# Patient Record
Sex: Male | Born: 1960 | Race: Black or African American | Hispanic: No | Smoking: Never smoker
Health system: Southern US, Community
[De-identification: ages and names within clinical notes are randomized; demographics above are authoritative.]

## PROBLEM LIST (undated history)

## (undated) DIAGNOSIS — I509 Heart failure, unspecified: Secondary | ICD-10-CM

## (undated) DIAGNOSIS — K220 Achalasia of cardia: Secondary | ICD-10-CM

## (undated) DIAGNOSIS — E079 Disorder of thyroid, unspecified: Secondary | ICD-10-CM

## (undated) DIAGNOSIS — F329 Major depressive disorder, single episode, unspecified: Secondary | ICD-10-CM

## (undated) DIAGNOSIS — Z87442 Personal history of urinary calculi: Secondary | ICD-10-CM

## (undated) DIAGNOSIS — Z955 Presence of coronary angioplasty implant and graft: Secondary | ICD-10-CM

## (undated) DIAGNOSIS — E785 Hyperlipidemia, unspecified: Secondary | ICD-10-CM

## (undated) DIAGNOSIS — Z9861 Coronary angioplasty status: Secondary | ICD-10-CM

## (undated) DIAGNOSIS — I214 Non-ST elevation (NSTEMI) myocardial infarction: Secondary | ICD-10-CM

## (undated) DIAGNOSIS — Z8679 Personal history of other diseases of the circulatory system: Secondary | ICD-10-CM

## (undated) DIAGNOSIS — F3289 Other specified depressive episodes: Secondary | ICD-10-CM

## (undated) DIAGNOSIS — D649 Anemia, unspecified: Secondary | ICD-10-CM

## (undated) DIAGNOSIS — R51 Headache: Secondary | ICD-10-CM

## (undated) DIAGNOSIS — G4700 Insomnia, unspecified: Secondary | ICD-10-CM

## (undated) DIAGNOSIS — I219 Acute myocardial infarction, unspecified: Secondary | ICD-10-CM

## (undated) DIAGNOSIS — Z8489 Family history of other specified conditions: Secondary | ICD-10-CM

## (undated) DIAGNOSIS — K5900 Constipation, unspecified: Secondary | ICD-10-CM

## (undated) DIAGNOSIS — G4730 Sleep apnea, unspecified: Secondary | ICD-10-CM

## (undated) DIAGNOSIS — R569 Unspecified convulsions: Secondary | ICD-10-CM

## (undated) DIAGNOSIS — I10 Essential (primary) hypertension: Secondary | ICD-10-CM

## (undated) DIAGNOSIS — I2510 Atherosclerotic heart disease of native coronary artery without angina pectoris: Secondary | ICD-10-CM

## (undated) DIAGNOSIS — Z860100 Personal history of colon polyps, unspecified: Secondary | ICD-10-CM

## (undated) DIAGNOSIS — R7302 Impaired glucose tolerance (oral): Secondary | ICD-10-CM

## (undated) DIAGNOSIS — I639 Cerebral infarction, unspecified: Secondary | ICD-10-CM

## (undated) DIAGNOSIS — I429 Cardiomyopathy, unspecified: Secondary | ICD-10-CM

## (undated) DIAGNOSIS — Z8719 Personal history of other diseases of the digestive system: Secondary | ICD-10-CM

## (undated) DIAGNOSIS — N259 Disorder resulting from impaired renal tubular function, unspecified: Secondary | ICD-10-CM

## (undated) DIAGNOSIS — K219 Gastro-esophageal reflux disease without esophagitis: Secondary | ICD-10-CM

## (undated) DIAGNOSIS — M19019 Primary osteoarthritis, unspecified shoulder: Secondary | ICD-10-CM

## (undated) DIAGNOSIS — N184 Chronic kidney disease, stage 4 (severe): Secondary | ICD-10-CM

## (undated) DIAGNOSIS — M109 Gout, unspecified: Secondary | ICD-10-CM

## (undated) HISTORY — DX: Gastro-esophageal reflux disease without esophagitis: K21.9

## (undated) HISTORY — DX: Achalasia of cardia: K22.0

## (undated) HISTORY — DX: Headache: R51

## (undated) HISTORY — PX: TONSILLECTOMY: SHX5217

## (undated) HISTORY — DX: Other specified depressive episodes: F32.89

## (undated) HISTORY — DX: Cardiomyopathy, unspecified: I42.9

## (undated) HISTORY — DX: Hyperlipidemia, unspecified: E78.5

## (undated) HISTORY — DX: Disorder resulting from impaired renal tubular function, unspecified: N25.9

## (undated) HISTORY — DX: Anemia, unspecified: D64.9

## (undated) HISTORY — PX: CARDIAC CATHETERIZATION: SHX172

## (undated) HISTORY — DX: Impaired glucose tolerance (oral): R73.02

## (undated) HISTORY — DX: Disorder of thyroid, unspecified: E07.9

## (undated) HISTORY — PX: COLONOSCOPY: SHX174

## (undated) HISTORY — DX: Major depressive disorder, single episode, unspecified: F32.9

## (undated) HISTORY — DX: Personal history of urinary calculi: Z87.442

## (undated) HISTORY — PX: CORONARY ANGIOPLASTY: SHX604

## (undated) HISTORY — DX: Essential (primary) hypertension: I10

## (undated) HISTORY — DX: Primary osteoarthritis, unspecified shoulder: M19.019

## (undated) HISTORY — DX: Acute myocardial infarction, unspecified: I21.9

## (undated) HISTORY — DX: Personal history of other diseases of the circulatory system: Z86.79

## (undated) HISTORY — PX: TONSILLECTOMY: SUR1361

---

## 2005-06-09 ENCOUNTER — Ambulatory Visit: Payer: Self-pay | Admitting: Internal Medicine

## 2005-06-19 ENCOUNTER — Ambulatory Visit: Payer: Self-pay | Admitting: Internal Medicine

## 2005-06-30 ENCOUNTER — Inpatient Hospital Stay (HOSPITAL_COMMUNITY): Admission: EM | Admit: 2005-06-30 | Discharge: 2005-07-04 | Payer: Self-pay | Admitting: Emergency Medicine

## 2005-06-30 ENCOUNTER — Ambulatory Visit: Payer: Self-pay | Admitting: Internal Medicine

## 2005-07-02 ENCOUNTER — Encounter: Payer: Self-pay | Admitting: Cardiovascular Disease

## 2005-07-02 ENCOUNTER — Ambulatory Visit: Payer: Self-pay | Admitting: Cardiovascular Disease

## 2005-07-09 ENCOUNTER — Ambulatory Visit: Payer: Self-pay | Admitting: Internal Medicine

## 2005-07-11 ENCOUNTER — Ambulatory Visit: Payer: Self-pay | Admitting: Internal Medicine

## 2005-07-15 ENCOUNTER — Ambulatory Visit (HOSPITAL_COMMUNITY): Admission: RE | Admit: 2005-07-15 | Discharge: 2005-07-15 | Payer: Self-pay | Admitting: Internal Medicine

## 2005-07-18 ENCOUNTER — Ambulatory Visit: Payer: Self-pay | Admitting: Licensed Clinical Social Worker

## 2005-07-18 ENCOUNTER — Ambulatory Visit: Payer: Self-pay | Admitting: Internal Medicine

## 2005-07-22 ENCOUNTER — Ambulatory Visit: Payer: Self-pay | Admitting: Licensed Clinical Social Worker

## 2005-08-05 ENCOUNTER — Encounter: Admission: RE | Admit: 2005-08-05 | Discharge: 2005-08-05 | Payer: Self-pay | Admitting: Nephrology

## 2005-08-08 ENCOUNTER — Ambulatory Visit: Payer: Self-pay | Admitting: Licensed Clinical Social Worker

## 2005-08-15 ENCOUNTER — Ambulatory Visit: Payer: Self-pay | Admitting: Internal Medicine

## 2005-08-18 ENCOUNTER — Ambulatory Visit: Payer: Self-pay | Admitting: Internal Medicine

## 2005-08-22 ENCOUNTER — Ambulatory Visit: Payer: Self-pay | Admitting: Licensed Clinical Social Worker

## 2005-09-08 ENCOUNTER — Encounter: Admission: RE | Admit: 2005-09-08 | Discharge: 2005-12-07 | Payer: Self-pay | Admitting: Neurology

## 2005-09-19 ENCOUNTER — Ambulatory Visit: Payer: Self-pay | Admitting: Internal Medicine

## 2005-09-19 ENCOUNTER — Ambulatory Visit: Payer: Self-pay | Admitting: Licensed Clinical Social Worker

## 2005-09-22 ENCOUNTER — Ambulatory Visit: Payer: Self-pay | Admitting: Internal Medicine

## 2005-10-03 ENCOUNTER — Ambulatory Visit (HOSPITAL_COMMUNITY): Admission: RE | Admit: 2005-10-03 | Discharge: 2005-10-03 | Payer: Self-pay | Admitting: Internal Medicine

## 2005-10-03 LAB — HM COLONOSCOPY

## 2005-10-07 ENCOUNTER — Ambulatory Visit: Payer: Self-pay | Admitting: Internal Medicine

## 2006-03-03 ENCOUNTER — Ambulatory Visit: Payer: Self-pay | Admitting: Internal Medicine

## 2006-03-05 ENCOUNTER — Ambulatory Visit (HOSPITAL_COMMUNITY): Admission: RE | Admit: 2006-03-05 | Discharge: 2006-03-05 | Payer: Self-pay | Admitting: Internal Medicine

## 2006-05-19 ENCOUNTER — Ambulatory Visit: Payer: Self-pay | Admitting: Internal Medicine

## 2006-05-20 ENCOUNTER — Encounter (INDEPENDENT_AMBULATORY_CARE_PROVIDER_SITE_OTHER): Payer: Self-pay | Admitting: *Deleted

## 2006-05-20 ENCOUNTER — Ambulatory Visit: Payer: Self-pay | Admitting: Internal Medicine

## 2006-06-15 ENCOUNTER — Ambulatory Visit (HOSPITAL_COMMUNITY): Admission: RE | Admit: 2006-06-15 | Discharge: 2006-06-15 | Payer: Self-pay | Admitting: Internal Medicine

## 2006-07-02 ENCOUNTER — Ambulatory Visit: Payer: Self-pay | Admitting: Internal Medicine

## 2006-07-14 ENCOUNTER — Ambulatory Visit (HOSPITAL_COMMUNITY): Admission: RE | Admit: 2006-07-14 | Discharge: 2006-07-14 | Payer: Self-pay | Admitting: Internal Medicine

## 2006-07-16 ENCOUNTER — Ambulatory Visit: Payer: Self-pay | Admitting: Internal Medicine

## 2006-10-25 DIAGNOSIS — R51 Headache: Secondary | ICD-10-CM

## 2006-10-25 DIAGNOSIS — E785 Hyperlipidemia, unspecified: Secondary | ICD-10-CM

## 2006-10-25 DIAGNOSIS — I10 Essential (primary) hypertension: Secondary | ICD-10-CM | POA: Insufficient documentation

## 2006-10-25 DIAGNOSIS — M19019 Primary osteoarthritis, unspecified shoulder: Secondary | ICD-10-CM | POA: Insufficient documentation

## 2006-10-25 DIAGNOSIS — K649 Unspecified hemorrhoids: Secondary | ICD-10-CM | POA: Insufficient documentation

## 2006-10-25 DIAGNOSIS — R519 Headache, unspecified: Secondary | ICD-10-CM | POA: Insufficient documentation

## 2006-10-25 DIAGNOSIS — J309 Allergic rhinitis, unspecified: Secondary | ICD-10-CM | POA: Insufficient documentation

## 2006-10-25 DIAGNOSIS — F329 Major depressive disorder, single episode, unspecified: Secondary | ICD-10-CM

## 2006-10-25 DIAGNOSIS — F32A Depression, unspecified: Secondary | ICD-10-CM | POA: Insufficient documentation

## 2006-10-25 DIAGNOSIS — I639 Cerebral infarction, unspecified: Secondary | ICD-10-CM | POA: Insufficient documentation

## 2006-10-25 DIAGNOSIS — Z87442 Personal history of urinary calculi: Secondary | ICD-10-CM

## 2007-03-18 HISTORY — PX: STOMACH SURGERY: SHX791

## 2007-10-26 ENCOUNTER — Telehealth (INDEPENDENT_AMBULATORY_CARE_PROVIDER_SITE_OTHER): Payer: Self-pay | Admitting: *Deleted

## 2008-05-15 ENCOUNTER — Telehealth (INDEPENDENT_AMBULATORY_CARE_PROVIDER_SITE_OTHER): Payer: Self-pay | Admitting: *Deleted

## 2008-05-16 ENCOUNTER — Ambulatory Visit: Payer: Self-pay | Admitting: Endocrinology

## 2008-05-16 LAB — CONVERTED CEMR LAB
BUN: 18 mg/dL (ref 6–23)
Basophils Relative: 0 % (ref 0.0–3.0)
GFR calc Af Amer: 49 mL/min
GFR calc non Af Amer: 41 mL/min
Glucose, Bld: 102 mg/dL — ABNORMAL HIGH (ref 70–99)
HCT: 49.3 % (ref 39.0–52.0)
Hemoglobin: 16.8 g/dL (ref 13.0–17.0)
Ketones, ur: NEGATIVE mg/dL
Monocytes Absolute: 0.6 10*3/uL (ref 0.1–1.0)
Monocytes Relative: 7 % (ref 3.0–12.0)
Neutro Abs: 5.9 10*3/uL (ref 1.4–7.7)
Platelets: 276 10*3/uL (ref 150–400)
RBC: 5.29 M/uL (ref 4.22–5.81)
RDW: 12.6 % (ref 11.5–14.6)
Sed Rate: 22 mm/hr — ABNORMAL HIGH (ref 0–16)
Total Protein, Urine: >=300 mg/dL — AB
Urine Glucose: NEGATIVE mg/dL
WBC: 8.6 10*3/uL (ref 4.5–10.5)

## 2008-05-17 ENCOUNTER — Telehealth (INDEPENDENT_AMBULATORY_CARE_PROVIDER_SITE_OTHER): Payer: Self-pay | Admitting: *Deleted

## 2008-05-22 ENCOUNTER — Encounter (INDEPENDENT_AMBULATORY_CARE_PROVIDER_SITE_OTHER): Payer: Self-pay | Admitting: *Deleted

## 2008-05-22 ENCOUNTER — Ambulatory Visit: Payer: Self-pay | Admitting: Internal Medicine

## 2008-05-22 DIAGNOSIS — N259 Disorder resulting from impaired renal tubular function, unspecified: Secondary | ICD-10-CM | POA: Insufficient documentation

## 2008-05-22 DIAGNOSIS — I739 Peripheral vascular disease, unspecified: Secondary | ICD-10-CM

## 2008-05-22 DIAGNOSIS — K220 Achalasia of cardia: Secondary | ICD-10-CM

## 2008-05-22 DIAGNOSIS — K219 Gastro-esophageal reflux disease without esophagitis: Secondary | ICD-10-CM

## 2008-05-22 DIAGNOSIS — D649 Anemia, unspecified: Secondary | ICD-10-CM

## 2008-05-23 LAB — CONVERTED CEMR LAB
Direct LDL: 209.1 mg/dL
HDL: 47.9 mg/dL (ref >39.0)
VLDL: 27 mg/dL (ref 0–40)

## 2008-06-06 ENCOUNTER — Ambulatory Visit: Payer: Self-pay | Admitting: Licensed Clinical Social Worker

## 2008-06-13 ENCOUNTER — Ambulatory Visit: Payer: Self-pay | Admitting: Licensed Clinical Social Worker

## 2008-06-23 ENCOUNTER — Ambulatory Visit: Payer: Self-pay | Admitting: Internal Medicine

## 2008-06-23 ENCOUNTER — Ambulatory Visit: Payer: Self-pay | Admitting: Licensed Clinical Social Worker

## 2008-06-23 LAB — CONVERTED CEMR LAB
ALT: 56 units/L — ABNORMAL HIGH (ref 0–53)
Albumin: 3.9 g/dL (ref 3.5–5.2)
Alkaline Phosphatase: 43 units/L (ref 39–117)
BUN: 17 mg/dL (ref 6–23)
Calcium: 9.5 mg/dL (ref 8.4–10.5)
Chloride: 107 meq/L (ref 96–112)
Creatinine, Ser: 1.9 mg/dL — ABNORMAL HIGH (ref 0.4–1.5)
Eosinophils Relative: 3.7 % (ref 0.0–5.0)
GFR calc non Af Amer: 48.88 mL/min (ref >60)
HCT: 40 % (ref 39.0–52.0)
Hemoglobin: 13.6 g/dL (ref 13.0–17.0)
Leukocytes, UA: NEGATIVE
MCHC: 33.9 g/dL (ref 30.0–36.0)
MCV: 92.5 fL (ref 78.0–100.0)
Monocytes Relative: 10.2 % (ref 3.0–12.0)
PSA: 2.06 ng/mL (ref 0.10–4.00)
Platelets: 262 10*3/uL (ref 150.0–400.0)
Potassium: 4.4 meq/L (ref 3.5–5.1)
RBC: 4.33 M/uL (ref 4.22–5.81)
Sodium: 143 meq/L (ref 135–145)
TSH: 1.29 microintl units/mL (ref 0.35–5.50)
Total Bilirubin: 0.9 mg/dL (ref 0.3–1.2)
Total CHOL/HDL Ratio: 4
Total Protein, Urine: >=300 mg/dL
Total Protein: 7 g/dL (ref 6.0–8.3)
Triglycerides: 91 mg/dL (ref 0.0–149.0)
Urine Glucose: NEGATIVE mg/dL
WBC: 7 10*3/uL (ref 4.5–10.5)

## 2008-07-18 ENCOUNTER — Ambulatory Visit: Payer: Self-pay | Admitting: Licensed Clinical Social Worker

## 2008-07-24 ENCOUNTER — Ambulatory Visit: Payer: Self-pay | Admitting: Licensed Clinical Social Worker

## 2008-12-20 ENCOUNTER — Ambulatory Visit: Payer: Self-pay | Admitting: Internal Medicine

## 2009-07-02 ENCOUNTER — Telehealth: Payer: Self-pay | Admitting: Internal Medicine

## 2009-10-29 ENCOUNTER — Ambulatory Visit: Payer: Self-pay | Admitting: Internal Medicine

## 2010-02-25 ENCOUNTER — Encounter: Payer: Self-pay | Admitting: Internal Medicine

## 2010-02-26 ENCOUNTER — Encounter: Payer: Self-pay | Admitting: Internal Medicine

## 2010-03-01 ENCOUNTER — Encounter: Payer: Self-pay | Admitting: Internal Medicine

## 2010-04-07 ENCOUNTER — Encounter: Payer: Self-pay | Admitting: Nephrology

## 2010-04-16 NOTE — Assessment & Plan Note (Signed)
Summary: mother tested for MRSA - he wants to be checked/cd   Vital Signs:  Patient profile:   50 year old male Height:      72 inches Weight:      270.25 pounds BMI:     36.78 O2 Sat:      97 % on Room air Temp:     98.6 degrees F oral Pulse rate:   61 / minute BP sitting:   108 / 70  (left arm) Cuff size:   large  Vitals Entered By: Zella Ball Ewing CMA Duncan Dull) (October 29, 2009 8:54 AM)  O2 Flow:  Room air CC: MRSA test/RE   CC:  MRSA test/RE.  History of Present Illness: here to f/u ; labetolol changed to tekturna per renal MD in [place];  currently lives there;  local mother had knee replcement, had a Boil on the buttock , not clear if MRSA (neg per per) and he is wondering if he has ben exposed as he had to lif and touch her while helping to take care of her;  currently no fever, nasal pain or d/c;  and no boils and no other skin erythema, or tears. NO hx of MRSA himself.  Pt denies CP, sob, doe, wheezing, orthopnea, pnd, worsening LE edema, palps, dizziness or syncope  Pt denies new neuro symptoms such as headache, facial or extremity weakness  OVeralll excellent compliance with meds, and good tolerability.  BP was overcontrolled adn pt had the "shake" with taking the labetolol. Trying to follow lower chol diet.    Problems Prior to Update: 1)  Preventive Health Care  (ICD-V70.0) 2)  Special Screening Malig Neoplasms Other Sites  (ICD-V76.49) 3)  Anemia-nos  (ICD-285.9) 4)  Achalasia  (ICD-530.0) 5)  Peripheral Vascular Disease  (ICD-443.9) 6)  Renal Insufficiency  (ICD-588.9) 7)  Gerd  (ICD-530.81) 8)  Fever Unspecified  (ICD-780.60) 9)  Headache  (ICD-784.0) 10)  Hemorrhoids  (ICD-455.6) 11)  Degenerative Joint Disease, Shoulder  (ICD-715.91) 12)  Symptom, Syncope and Collapse  (ICD-780.2) 13)  Renal Calculus, Hx of  (ICD-V13.01) 14)  Cerebrovascular Accident, Hx of  (ICD-V12.50) 15)  Hypertension  (ICD-401.9) 16)  Hyperlipidemia  (ICD-272.4) 17)  Depression   (ICD-311) 18)  Allergic Rhinitis  (ICD-477.9)  Medications Prior to Update: 1)  Crestor 20 Mg Tabs (Rosuvastatin Calcium) .Marland Kitchen.. 1po Once Daily 2)  Azor 10-40 Mg Tabs (Amlodipine-Olmesartan) .... Take 1 By Mouth Qd 3)  Clonidine Hcl 0.2 Mg Tabs (Clonidine Hcl) .... Take 1 Three Times A Day 4)  Folic Acid 400 Mcg Tabs (Folic Acid) .... Take 1 By Mouth Once Daily 5)  Vitamin B-12 Cr 1000 Mcg Cr-Tabs (Cyanocobalamin) .... Take 1 By Mouth Once Daily 6)  Ecotrin 325 Mg Tbec (Aspirin) .Marland Kitchen.. 1 By Mouth Once Daily 7)  Labetalol Hcl 200 Mg Tabs (Labetalol Hcl) .Marland Kitchen.. 1 By Mouth Two Times A Day 8)  Terazosin Hcl 2 Mg Caps (Terazosin Hcl) .Marland Kitchen.. 1 By Mouth At Bedtime  Current Medications (verified): 1)  Crestor 20 Mg Tabs (Rosuvastatin Calcium) .Marland Kitchen.. 1po Once Daily 2)  Azor 10-40 Mg Tabs (Amlodipine-Olmesartan) .... Take 1 By Mouth Qd 3)  Clonidine Hcl 0.2 Mg Tabs (Clonidine Hcl) .... Take 1 Three Times A Day 4)  Folic Acid 400 Mcg Tabs (Folic Acid) .... Take 1 By Mouth Once Daily 5)  Vitamin B-12 Cr 1000 Mcg Cr-Tabs (Cyanocobalamin) .... Take 1 By Mouth Once Daily 6)  Ecotrin 325 Mg Tbec (Aspirin) .Marland Kitchen.. 1 By Mouth Once Daily 7)  Tekturna 150 Mg Tabs (Aliskiren Fumarate) .Marland Kitchen.. 1 By Mouth Once Daily 8)  Terazosin Hcl 2 Mg Caps (Terazosin Hcl) .Marland Kitchen.. 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   Head:  normocephalic and atraumatic.   Eyes:  vision grossly intact, pupils equal, and pupils round.   Ears:  R ear normal and L ear normal.   Nose:  no external deformity and no nasal discharge.   Mouth:  no gingival abnormalities and pharynx pink and moist.   Neck:  supple and no masses.   Lungs:  normal respiratory effort and normal breath sounds.   Heart:  normal rate and regular rhythm.   Abdomen:  soft, non-tender, and normal bowel sounds.   Extremities:  no edema, no erythema    Impression & Recommendations:  Problem # 1:  HYPERTENSION (ICD-401.9)  His updated medication list for this problem includes:    Azor 10-40 Mg Tabs (Amlodipine-olmesartan) .Marland Kitchen... Take 1 by mouth qd    Clonidine Hcl 0.2 Mg Tabs (Clonidine hcl) .Marland Kitchen... Take 1 three times a day    Tekturna 150 Mg Tabs (Aliskiren fumarate) .Marland Kitchen... 1 by mouth once daily    Terazosin Hcl 2 Mg Caps (Terazosin hcl) .Marland Kitchen... 1 by mouth at bedtime  BP today: 108/70 Prior BP: 142/94 (06/23/2008)  Labs Reviewed: K+: 4.4 (06/23/2008) Creat: : 1.9 (06/23/2008)   Chol: 206 (06/23/2008)   HDL: 56.60 (06/23/2008)   LDL: DEL (05/22/2008)   TG: 91.0 (06/23/2008) stable overall by hx and exam, ok to continue meds/tx as is, mult samples of the azor given today, f/u with MD in [place] where he now live  Problem # 2:  RENAL INSUFFICIENCY (ICD-588.9) stable overall by hx and exam, ok to continue meds/tx as is  - declines lab check today  Problem # 3:  HYPERLIPIDEMIA (ICD-272.4)  His updated medication list for this problem includes:    Crestor 20 Mg Tabs (Rosuvastatin calcium) .Marland Kitchen... 1po once daily  Labs  Reviewed: SGOT: 44 (06/23/2008)   SGPT: 56 (06/23/2008)   HDL:56.60 (06/23/2008), 47.9 (05/22/2008)  LDL:DEL (05/22/2008)  Chol:206 (06/23/2008), 306 (05/22/2008)  Trig:91.0 (06/23/2008), 134 (05/22/2008) stable overall by hx and exam, ok to continue meds/tx as is, declines labs today  Complete Medication List: 1)  Crestor 20 Mg Tabs (Rosuvastatin calcium) .Marland Kitchen.. 1po once daily 2)  Azor 10-40 Mg Tabs (Amlodipine-olmesartan) .... Take 1 by mouth qd 3)  Clonidine Hcl 0.2 Mg Tabs (Clonidine hcl) .... Take 1 three times a day 4)  Folic Acid 400 Mcg Tabs (Folic acid) .... Take 1 by mouth once daily 5)  Vitamin B-12 Cr 1000 Mcg Cr-tabs (Cyanocobalamin) .... Take 1 by mouth once daily 6)  Ecotrin 325 Mg Tbec (Aspirin) .Marland KitchenMarland KitchenMarland Kitchen  1 by mouth once daily 7)  Tekturna 150 Mg Tabs (Aliskiren fumarate) .Marland Kitchen.. 1 by mouth once daily 8)  Terazosin Hcl 2 Mg Caps (Terazosin hcl) .Marland Kitchen.. 1 by mouth at bedtime  Patient Instructions: 1)  Continue all previous medications as before this visit  2)  You are given multiple samples of the azor today 3)  Please schedule a follow-up appointment as needed. 4)  Please go to the [facility] Dept to see about the MRSA

## 2010-04-16 NOTE — Progress Notes (Signed)
  Phone Note Refill Request  on July 02, 2009 3:15 PM  Refills Requested: Medication #1:  CLONIDINE HCL 0.2 MG TABS TAKE 1 three times a day   Dosage confirmed as above?Dosage Confirmed   Notes: [address] fax#[phone] Initial call taken by: Scharlene Gloss,  July 02, 2009 3:16 PM    Prescriptions: CLONIDINE HCL 0.2 MG TABS (CLONIDINE HCL) TAKE 1 three times a day  #30 x 0   Entered by:   Scharlene Gloss   Authorized by:   Corwin Levins MD   Signed by:   Scharlene Gloss on 07/02/2009   Method used:   Faxed to ...       [facility]* (retail)       [address]       [place], [place]         Ph: [phone]       Fax: [phone]   RxID:   [id number]

## 2010-04-18 NOTE — Letter (Signed)
Summary: CMN/Infusion Resource  CMN/Infusion Resource   Imported By: Lester San Gabriel 03/04/2010 09:48:06  _____________________________________________________________________  External Attachment:    Type:   Image     Comment:   External Document

## 2010-04-18 NOTE — Letter (Signed)
Summary: CMN for [employer]/Infusion Resource  CMN for [employer]/Infusion Resource   Imported By: Sherian Rein 03/06/2010 11:11:09  _____________________________________________________________________  External Attachment:    Type:   Image     Comment:   External Document

## 2010-04-18 NOTE — Letter (Signed)
Summary: Denied/Infusion Resource  Denied/Infusion Resource   Imported By: Lester Walker 03/01/2010 09:58:30  _____________________________________________________________________  External Attachment:    Type:   Image     Comment:   External Document

## 2010-08-02 NOTE — Discharge Summary (Signed)
[name], [name]              ACCOUNT NO.:  [id number]   MEDICAL RECORD NO.:  [id number]          PATIENT TYPE:  INP   LOCATION:  4735                         FACILITY:  [facility]   PHYSICIAN:  Rene Paci, M.D. LHCDATE OF BIRTH:  09/05/1960   DATE OF ADMISSION:  06/30/2005  DATE OF DISCHARGE:  07/04/2005                                 DISCHARGE SUMMARY   DISCHARGE DIAGNOSES:  1.  Multiple small bihemispheric strokes not acute.  2.  Right lower extremity weakness, etiology unclear.  3.  Uncontrolled hypertension.  4.  Dyslipidemia.   HISTORY OF PRESENT ILLNESS:  The patient is a 50 year old African-American  male admitted on June 30, 2005 from the office with complaints of right leg  weakness and right sided numbness. The patient presented with worsening  right leg symptoms and numbness on his right side. The patient had a past  medical history of CVA most recently in February 2007 and also twice in  2006. He apparently was treated for a cerebral hemorrhage in the [place],  [place] area. The patient was admitted for further evaluation.   PAST MEDICAL HISTORY:  1.  History of CVA, possible cerebral hemorrhage.  2.  History of depression.  3.  History of fainting spells.  4.  Hypertension uncontrolled.  5.  Questionable chronic renal insufficiency.  6.  History of headaches.   HOSPITAL COURSE:  #1.  RIGHT LOWER EXTREMITY WEAKNESS.  The patient was  admitted and underwent an MRI of the brain. MRI of the brain noted multiple  small bihemispheric strokes without any acute changes. An MRI of the C spine  was ordered to rule out myelopathy. MRI did not reveal myelopathy; however,  it did reveal some disk protrusion C5 to 6 and C6 to 7. Homocystine level  was checked which was elevated and the patient was placed Foltx by neurology  who consulted during this admission. The patient was seen by Dr. Pearlean Brownie of  [facility].   #2.  UNCONTROLLED HYPERTENSION.  The  patient was noted to have elevated  blood pressure on admission; however, the patient's blood pressure was noted  to become increasingly elevated during the admission and as a result his  medications were increased. He will be discharged home on lisinopril,  labetalol, Norvasc and Catapres. However, the patient's blood pressure is  very difficult to control. He will need further outpatient evaluation of the  etiology of his hypertension or a possible secondary hypertension workup.  Will defer the patient to primary care physician.   #3.  DYSLIPIDEMIA.  The patient's Statin was increased during this admission  and the patient's total cholesterol was 216 and his HDL was 37.4.   LABORATORY DATA:  At discharge, homocystine 16.5. A 2-D echo shows left  ventricular ejection fraction of 60% with no embolism noted. BUN 15,  creatinine 1.9. Hemoglobin 13.4, hematocrit 39.5.   DISCHARGE MEDICATIONS:  1.  Zetia 10 mg p.o. daily.  2.  Lisinopril 20 g p.o. daily.  3.  Aspirin 325 mg p.o. daily.  4.  Foltx 1 tablet p.o. daily.  5.  Zocor 60  mg p.o. daily.  6.  Catapres 0.2 mg p.o. t.i.d.  7.  Norvasc 10 mg daily.  8.  Plavix 75 mg daily.   FOLLOW UP:  The patient is scheduled to followup with Dr. Oliver Barre on May  1 at 10:00 a.m. He is also being referred for outpatient physical therapy.  He is instructed to go to the ER should he develop worsening right sided  weakness.      Melissa S. Peggyann Juba, NP      Rene Paci, M.D. [facility]  Electronically Signed    MSO/MEDQ  D:  07/04/2005  T:  07/05/2005  Job:  [id number]   cc:   Corwin Levins, M.D. [facility]  [address]  [place]  [place] [address]

## 2010-08-02 NOTE — H&P (Signed)
[name], [name]              ACCOUNT NO.:  [id number]   MEDICAL RECORD NO.:  [id number]          PATIENT TYPE:  EMS   LOCATION:  MAJO                         FACILITY:  [facility]   PHYSICIAN:  [name], D.O. [facility]   DATE OF BIRTH:  Aug 20, 1960   DATE OF ADMISSION:  06/30/2005  DATE OF DISCHARGE:                                HISTORY & PHYSICAL   CHIEF COMPLAINT:  Right leg weakness and right-sided numbness.   HISTORY OF PRESENT ILLNESS:  The patient is a 50 year old African-American  male patient of [name], M.D. [facility] who comes to see [place] today regarding  worsening right leg symptoms and numbness and tingling in his right side.  The patient has past medical history of CVA, most recently in February of  2007.  Also twice in 2006 in February and August.  The patient states that  he was treated for cerebral hemorrhage in [place], [place] area.  The  patient states his previous strokes have been attributed to labile  hypertension.  The patient woke up this morning with weakness in his right  leg, had trouble standing, and also noted some twitching on the right side  of his face.  He took his blood pressure medications and then laid back  down.  Once he tried to get out of bed and walk to the bathroom, the patient  noticed his right leg buckled.  In addition, he noticed numbness in both  arms, but particularly tingling in his right hand and had an odd feeling.  The patient notes that approximately 1 week ago, he had similar symptoms  after having a mole removal at a dermatologist.  The patient denies any  other associated symptoms such as chest pain or shortness of breath.  He  states that he does take his medications on a regular basis.  When he first  met Dr. Jonny Glenn, the patient had been out of his blood pressure medicines and  blood pressure was uncontrolled, on March 26, almost 200/115.   PAST MEDICAL HISTORY:  History of CVA, possible cerebral hemorrhage, history  of depression,  history of fainting spells, hypertension uncontrolled,  questionable chronic renal insufficiency, history of headaches.   PAST SURGICAL HISTORY:  He has had tonsils and adenoids.   SOCIAL HISTORY:  The patient is currently undergoing a divorce.  The patient  has a 50-year-old son that is disabled.  He worked as a Electrical engineer.  Denies any alcohol or tobacco use and denies any recreational drug use in  the past.   REVIEW OF SYSTEMS:  No fevers or chills.  No chest pain or shortness of  breath.  Positive headache with photophobia.  All other systems negative.   CURRENT MEDICATIONS:  1.  Zetia 10 mg once a day.  2.  Clonidine 0.2 mg b.i.d.  3.  Lisinopril recently titrated up to 40 mg once a day.  4.  Plavix 75 mg once a day.  5.  Zoloft 50 mg b.i.d.  6.  Labetalol 200 mg b.i.d.   ALLERGIES:  Previous notes listed for Aggrenox.  The patient denies himself  any allergies.   PHYSICAL EXAMINATION:  VITAL SIGNS:  Weight is 258 pounds, temperature 97.3,  pulse 63, and blood pressure is 150/100 in the left arm in a seated  position.  GENERAL:  The patient is a pleasant, somewhat overweight, 50 year old  African-American male in no apparent distress.  HEENT:  Normocephalic and atraumatic.  Pupils equal, round, and reactive to  light bilaterally.  Extraocular motility was intact.  The patient was  anicteric.  Conjunctivae was within normal limits.  External auditory canals  and tympanic membranes were clear bilaterally.  Hearing was grossly normal.  Oropharynx examination was unremarkable.  NECK:  Supple with no adenopathy, carotid bruit, or thyromegaly.  CHEST:  Normal expiratory effort.  This was clear to auscultation  bilaterally.  No rhonchi, rales, or wheezing.  CARDIOVASCULAR:  Regular rate and rhythm.  No significant murmurs, rubs, or  gallops appreciated.  ABDOMEN:  Slightly protuberant, nontender, positive bowel sounds, no  organomegaly.  MUSCULOSKELETAL:  No cyanosis,  clubbing, or edema.  Muscle strength testing  showed slightly diminished hand grip on the right side as well as decrease  in hip flexor on the right side.  The patient was able to stand on his toes  and heel-walk without difficulty.  The patient's resting tone right side  normal.  It should be noted that during his muscle strength testing, his  responses were variable.  Unclear whether the patient objectively has any  muscle weakness.  The patient was able to, in terms of his gait, walk down  the hallway without any significant difficulty and seemed to be purposefully  dragging his right foot.  NEUROLOGY:  Cranial nerves II-XII grossly intact.  No facial droop.  The  patient had no pronator drift and no cerebellar signs.  Toes were downgoing  bilaterally.   FAMILY HISTORY:  Mother is age 51 and is accompanying this patient today.  Has history of hypertension that is controlled.  Father has a history of  CVA, hypertension, and coronary artery disease.  He has siblings that are in  good health.   IMPRESSION:  1.  Right leg weakness with right arm numbness in 50 year old African-      American male with history of multiple CVA's.  2.  Hypertension, poorly controlled.  3.  Headache with migraine features.  4.  Questionable chronic renal insufficiency.   RECOMMENDATIONS:  Unclear etiology of the patient's right-sided symptoms,  certainly with the patient's history of CVA, this could be a recurrence.  We  do not have previous records from his hospitalization in [place], but we  will repeat CAT scan of his head and also order MRI/MRA of his head and  neck.   Review of his laboratory data last showed his serum creatinine was slightly  elevated at 1.8.  I will decrease his Lisinopril back down to 20 mg, repeat  his creatinine.  Norvasc may be a good choice to help control his blood  pressure.  We should consider workup for secondary hypertension and ordering serum-free metanephrines may be  a consideration.   I will add aspirin 325 mg along with his Plavix and neurology consult will  be requested.      [name], D.O. [facility]  Electronically Signed     RY/MEDQ  D:  06/30/2005  T:  06/30/2005  Job:  [id number]   cc:   [name], M.D. [facility]  [address]  [place]  [place] [address]

## 2010-08-02 NOTE — Assessment & Plan Note (Signed)
[facility]                         GASTROENTEROLOGY OFFICE NOTE   NAME:NEWMANErasmus, Bistline                     MRN:          [id number]  DATE:05/19/2006                            DOB:          1961-01-27    CHIEF COMPLAINT:  Vomiting, dysphagia.   Mr. Sobel has had a about a month's problem of swallowing issues. He is  noting regurgitation and reflux at night. He has vomited and had some  streaks of blood in the past week or so too. He has to regurgitate or  immediately spit out liquids when he tries to eat them and it does not  seem like they go down properly he says. He has a frequent cough and  inability to eat certain foods. He is on soft foods at this time. He  said over the weekend he tried to eat some meat but he had to chew it  very carefully and it never did really pass through right. He had  problems with dysphagia in the past and had a hangup of a barium tablet  in the distal esophageal area from a barium swallow and I dilated him in  July 2007 and that seemed to help for some time.   MEDICATIONS:  1. Norvasc 10 mg daily.  2. Zocor daily.  3. Folate and B12 daily.  4. Zetia 10 mg daily.  5. Aspirin 325 mg daily.  6. Tekturna 300 mg q.h.s.  7. Plavix is currently off as he has run out for the last 5 days.  8. Foltx daily.  9. Catapres 0.2 t.i.d.  10.Multivitamin daily.   No known drug allergies.   PAST MEDICAL HISTORY:  Reviewed and unchanged from my previous  consultation and office notes.   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 He says that  is going well though he is having trouble sleeping because of reflux at  night and that is interfering with his studies.   PHYSICAL EXAMINATION:  VITAL SIGNS:  Height 6 feet, weight 244 pounds,  pulse 84, blood pressure 130/80.  HEENT:  Pharynx is clear.  NECK:  Supple, nontender, no mass.   ASSESSMENT:  Dysphagia problems, hematemesis and regurgitation issues.  The etiology of this is unclear. I suppose the possibility of a motility  disorder such as achalasia or something could be progressing. 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 I do not think there have been any new  strokes.   PLAN:  1. Proceed with upper GI endoscopy (he is off his Plavix) and possible      dilation of the esophagus.  2. Empiric Zegerid at bedtime.  3. He tells me he does not eat late and he has the head of the bed      elevated, hopefully he will continue with that.  4. Further plans pending the above, a repeat barium swallow versus      manometry could be indicated.  Iva Boop, MD,FACG  Electronically Signed    CEG/MedQ  DD: 05/19/2006  DT: 05/20/2006  Job #: [id number]

## 2010-08-02 NOTE — Op Note (Signed)
[name], [name]              ACCOUNT NO.:  [id number]   MEDICAL RECORD NO.:  [id number]          PATIENT TYPE:  AMB   LOCATION:  ENDO                         FACILITY:  [facility]   PHYSICIAN:  Iva Boop, MD,FACGDATE OF BIRTH:  11/04/1960   DATE OF PROCEDURE:  06/15/2006  DATE OF DISCHARGE:  06/15/2006                               OPERATIVE REPORT   PROCEDURE:  Esophageal manometry.   INDICATIONS:  Dysphagia and barium swallow suspicious for achalasia.  The patient has regurgitation, odynophagia, cough and indigestion and  weight loss.   Esophageal manometry tracings were performed by endoscopy staff at [facility].  The tracings were reviewed and deemed appropriate for  interpretation.   FINDINGS:  The lower esophageal sphincter proximal marking was 42 cm  distal, 47 cm from the nares with a total length of 5 cm.  The intra-  abdominal lower esophageal sphincter length was approximately 3.5 cm.  The pressure inversion point was at 43.5 cm from the nares.   Lower esophageal sphincter pressure was elevated 60.6 mmHg.   There was approximately 90% relaxation with wet swallows.  The residual  pressure was approximately 6.2 mmHg.   Lower esophageal body study was abnormal with a 0% of contractions  peristaltic, 40% simultaneous, 10% non transmitted, and 50% retrograde.   The upper esophageal body shows appropriate relaxation and reflexes.   Assessment of the study is most consistent with achalasia.   PLAN:  We will discuss results and possible therapy with the patient.      Iva Boop, MD,FACG  Electronically Signed     CEG/MEDQ  D:  06/28/2006  T:  06/28/2006  Job:  [id number]

## 2010-08-02 NOTE — Consult Note (Signed)
[name], [name]              ACCOUNT NO.:  [id number]   MEDICAL RECORD NO.:  [id number]          PATIENT TYPE:  INP   LOCATION:  1829                         FACILITY:  [facility]   PHYSICIAN:  Pramod P. Pearlean Brownie, MD    DATE OF BIRTH:  1960/04/21   DATE OF CONSULTATION:  DATE OF DISCHARGE:                                   CONSULTATION   REFERRING PHYSICIAN:  Thomos Lemons, M.D.   REASON FOR REFERAL:  Stroke.   HISTORY OF PRESENT ILLNESS:  Mr. Menta is a 50 year old African-American  gentleman, who has been having increased right leg weakness since last  Friday.  The patient states while getting out of a car on reaching home, he  noticed that his right leg buckled.  He was however, able to get up and walk  with dragging of his right foot.  He went to sleep.  The next day when he  woke up, he noticed the right leg was weak but he could walk.  A few hours  later, he felt fine and Saturday and Sunday were uneventful though his right  leg remained slightly weak.  He was able to walk.  Today, when he woke up  from bed in the morning, he felt alright and made his breakfast and noticed  numbness involving the right sides of his cheek as well as both of his  shoulders.  Subsequently, he developed some tingling on his right  fingertips.  He also started to develop headache and his right leg again  gave out.  He states every time he has had a stroke in the past, he has had  a similar headache and called EMS and was brought to the hospital.  Blood  pressure was found to be elevated 180/110 by the EMS.  Subsequently, in the  ER it has ranged in that range.  The patient states he may be feeling  better.  His numbness in his shoulders and fingertips has improved.  His  right leg also feels stronger though it has still not returned back to his  baseline.   PAST NEUROLOGICAL HISTORY:  Significant for multiple strokes.  His first  stroke was in 1993 when he had what he called Bell's palsy but this  was  __________ pontine infarct.  He has had subsequent strokes in 2005 and 2006  and earlier this year.  All of them have involved mainly worsening of his  right foot weakness, though the first stroke in 2005, did involve the body  and hemiparesis from which he has improved about 70%.  He still has some  residual weakness on the left.  Also, the strokes have involved some  numbness in the right hand as well as right leg weakness of which he has  improved almost completely most of the times.  The patient has also had  headaches during most of his strokes.  He denies history of migraine  headache.  He has had previous neurological care in [place].  He has seen a  neurologist there.  He was initially tried on aspirin.  Subsequently  Aggrenox but he  did not tolerate it due to headaches and hence has been  taking Plavix.  He has had extensive stroke workup as per the description  including transesophageal echocardiogram and he was not found to have a  patent foramen ovale or hypercoagulable cause for stroke.   PAST MEDICAL HISTORY:  1.  Hypertension.  2.  Hyperlipidemia.  3.  Multiple strokes.   HOME MEDICATIONS:  1.  Labetalol 200 twice a day.  2.  Clonidine 0.2 mg twice a day.  3.  Zocor 40 mg once a day.  4.  Plavix 75 mg a day.  5.  Lisinopril 10 mg once a day.  6.  Zetia 10 mg daily.   SOCIAL HISTORY:  The patient has recently moved to [place] from [place].  He is presently not smoking.  He does not drink alcohol.  He denies doing  drugs.   REVIEW OF SYSTEMS:  Not positive for any recent fever, cough, chest pain,  diarrhea or illness.   PHYSICAL EXAMINATION:  GENERAL:  A middle-aged, African-American gentleman,  who is not in distress, who is healthy looking.  VITAL SIGNS;  Afebrile.  Pulse rate 63 per minute and regular.  Respiratory  rate 20 per minute.  Blood pressure 196/106.  Distal pulses well felt.  HEAD:  Nontraumatic.  NECK:  Supple without bruit.  ENT:  Exam  unremarkable.  CARDIAC EXAM:  No murmur or gallop.  LUNGS:  Clear to auscultation.  ABDOMEN:  Soft and nontender.  NEUROLOGIC EXAM:  The patient is pleasant, awake, alert, cooperative.  There  is no aphasia, apraxia or dysarthria.  Eye movements are full range without  nystagmus.  Visual acuity is full to confrontation testing.  Face is  symmetric.  Tongue is midline.  Motor system exam reveals no upper extremity  drift.  Fine finger movements are decreased on the left side.  The orbits  right over the left upper extremity.  He has weakness of the left grip and  intrinsic hand movements on the left.  Right-sided strength is normal.  Lower extremity exam reveals mild 4/5 left-sided weakness with increased  tone.  He has weakness of the right hip flexors but I doubt his effort.  He  has at least 2-3/5 weakness of the right hip flexors.  Extensors are strong.  He has good strength at the knee and ankle on the right.  Deep tendon  reflexes are 2+ on the right, 3+ on the left.  The patient has subjective  diminished pattern pinprick sensation on the right body including the face  as well as the forehead with splitting of the midline for touch, as well as  vibration and sensation.  Finger-to-nose and __________.  His gait - he  drags his right foot while walking but can walk fairly well.  He can stand  on left foot unsupported but not on the right.   We have reviewed the MRI scan of the brain done today and reveals no acute  infarct.  Remote age small infarcts are noted in the right temporoparietal  lesion, as well as the right caudate head and pons.  Mild degree of  generalized cerebral atrophy and white matter micro __________ changes are  seen.  MRA of brain - there was no high-grade stenosis of the large vessels.  There is decreased flow in the peripheral, middle and posterior cerebral  artery territories likely due to intracranial arteriosclerosis, though the study is slightly suboptimal  due to artifacts.   ADMISSION  LABORATORY:  Unremarkable WBC count, blood chemistries, liver  enzymes and cardiac enzymes, all normal.  EKG shows sinus bradycardia  without  active ischemic findings.   IMPRESSION:  A 50 year old gentleman with multiple bihemispheric infarcts  related to small vessel disease and intracranial arteriosclerosis in the  past, who presents with subacute increased right leg weakness and bilateral  upper extremity paresthesias, which is of unclear etiology.  This probably  represents worsening of neuro deficits  without any obvious identified  etiology yet.  His exam is complicated by old neurological deficits from his  previous strokes, as well as lack of effort in examining his right leg as  well as some nonorganic features especially on the sensory exam.   PLAN:  I would recommend continuing present medication therapy of Plavix, as  well as aggressive blood pressure treatment with aiming for systolic blood  pressure goal below 130.  Strict control of hyperlipidemia with LDL goal  below 100.  Physical and occupational therapy consults.  MRI scan of the  neck to rule out any compression.  I had a long discussion with the patient  and his sister and answered questions.  Call for any further questions.           ______________________________  Sunny Schlein. Pearlean Brownie, MD     PPS/MEDQ  D:  06/30/2005  T:  07/01/2005  Job:  [id number]   cc:   Thomos Lemons, D.O. [facility]  [address] [place], [place] [address]

## 2010-09-10 ENCOUNTER — Telehealth: Payer: Self-pay | Admitting: Internal Medicine

## 2010-09-10 NOTE — Telephone Encounter (Signed)
The patient saw Dr Lorin Picket at [facility] in 2008.  I have given him the number to his office .  I have no records from [facility] in his chart.  He is trying to finalize things for his renal transplant.

## 2011-01-07 HISTORY — PX: KNEE SURGERY: SHX244

## 2011-03-18 HISTORY — PX: HERNIA REPAIR: SHX51

## 2011-03-25 ENCOUNTER — Ambulatory Visit (INDEPENDENT_AMBULATORY_CARE_PROVIDER_SITE_OTHER): Payer: Medicare Other | Admitting: Internal Medicine

## 2011-03-25 ENCOUNTER — Encounter: Payer: Self-pay | Admitting: Internal Medicine

## 2011-03-25 VITALS — BP 120/92 | HR 79 | Temp 98.9°F

## 2011-03-25 DIAGNOSIS — M109 Gout, unspecified: Secondary | ICD-10-CM

## 2011-03-25 MED ORDER — METHYLPREDNISOLONE ACETATE 80 MG/ML IJ SUSP
120.0000 mg | Freq: Once | INTRAMUSCULAR | Status: AC
  Administered 2011-03-25: 120 mg via INTRAMUSCULAR

## 2011-03-25 NOTE — Progress Notes (Signed)
  Subjective:    Patient ID: [name], male    DOB: 1960/04/24, 51 y.o.   MRN: [id number]  HPI  complains of gout flare > R ankle Hx same - rx'd uloric, colchicine and indomethacin- but takes prn/infrequently due to cost Denies precipitating injury No other joints affected during this flare Onset of symptoms 72 hours ago Has taken 2 colchicine and indomethacin without improvement in symptoms  Past Medical History  Diagnosis Date  . ACHALASIA   . ALLERGIC RHINITIS   . ANEMIA-NOS   . CEREBROVASCULAR ACCIDENT, HX OF   . DEGENERATIVE JOINT DISEASE, SHOULDER   . DEPRESSION   . GERD   . Headache   . HYPERLIPIDEMIA   . HYPERTENSION   . PERIPHERAL VASCULAR DISEASE   . RENAL CALCULUS, HX OF   . RENAL INSUFFICIENCY      Review of Systems  Constitutional: Negative for fever and chills.  Musculoskeletal: Positive for joint swelling (r ankle). Negative for back pain.       Objective:   Physical Exam BP 120/92  Pulse 79  Temp(Src) 98.9 F (37.2 C) (Oral)  SpO2 97% Gen: NAD MSkel - swollen/warm R ankle consistent with gout flare - no tophi or other affected joints in feet/knees/wrists or hands      Assessment & Plan:  Acute gout flare right ankle - IM Medrol for anti-inflammatory today Encouraged continued use Uloric as rx'd for next 30 days Continue when necessary colchicine and or indomethacin as needed

## 2011-03-25 NOTE — Patient Instructions (Signed)
It was good to see you today. You have been given an injection of Depo-Medrol for your gout flare treatment today Continue Uloric daily for next 30 days as well as colchicine and indomethacin when needed for other gout flares

## 2011-03-27 ENCOUNTER — Inpatient Hospital Stay (HOSPITAL_BASED_OUTPATIENT_CLINIC_OR_DEPARTMENT_OTHER)
Admission: EM | Admit: 2011-03-27 | Discharge: 2011-04-08 | DRG: 233 | Disposition: A | Discharge status: Home / Self Care | Payer: Medicare Other | Attending: Cardiothoracic Surgery | Admitting: Cardiothoracic Surgery

## 2011-03-27 ENCOUNTER — Other Ambulatory Visit: Payer: Self-pay

## 2011-03-27 ENCOUNTER — Emergency Department (INDEPENDENT_AMBULATORY_CARE_PROVIDER_SITE_OTHER): Payer: Medicare Other

## 2011-03-27 ENCOUNTER — Encounter (HOSPITAL_BASED_OUTPATIENT_CLINIC_OR_DEPARTMENT_OTHER): Payer: Self-pay | Admitting: Emergency Medicine

## 2011-03-27 DIAGNOSIS — N186 End stage renal disease: Secondary | ICD-10-CM | POA: Diagnosis present

## 2011-03-27 DIAGNOSIS — D638 Anemia in other chronic diseases classified elsewhere: Secondary | ICD-10-CM | POA: Diagnosis present

## 2011-03-27 DIAGNOSIS — Z8673 Personal history of transient ischemic attack (TIA), and cerebral infarction without residual deficits: Secondary | ICD-10-CM

## 2011-03-27 DIAGNOSIS — I517 Cardiomegaly: Secondary | ICD-10-CM

## 2011-03-27 DIAGNOSIS — R9431 Abnormal electrocardiogram [ECG] [EKG]: Secondary | ICD-10-CM | POA: Diagnosis not present

## 2011-03-27 DIAGNOSIS — E785 Hyperlipidemia, unspecified: Secondary | ICD-10-CM | POA: Diagnosis present

## 2011-03-27 DIAGNOSIS — Z87891 Personal history of nicotine dependence: Secondary | ICD-10-CM

## 2011-03-27 DIAGNOSIS — Z823 Family history of stroke: Secondary | ICD-10-CM | POA: Diagnosis not present

## 2011-03-27 DIAGNOSIS — M19019 Primary osteoarthritis, unspecified shoulder: Secondary | ICD-10-CM | POA: Diagnosis present

## 2011-03-27 DIAGNOSIS — I120 Hypertensive chronic kidney disease with stage 5 chronic kidney disease or end stage renal disease: Secondary | ICD-10-CM | POA: Diagnosis not present

## 2011-03-27 DIAGNOSIS — I214 Non-ST elevation (NSTEMI) myocardial infarction: Secondary | ICD-10-CM | POA: Diagnosis not present

## 2011-03-27 DIAGNOSIS — Z7982 Long term (current) use of aspirin: Secondary | ICD-10-CM

## 2011-03-27 DIAGNOSIS — I2109 ST elevation (STEMI) myocardial infarction involving other coronary artery of anterior wall: Secondary | ICD-10-CM | POA: Diagnosis not present

## 2011-03-27 DIAGNOSIS — N179 Acute kidney failure, unspecified: Secondary | ICD-10-CM | POA: Diagnosis present

## 2011-03-27 DIAGNOSIS — Z01810 Encounter for preprocedural cardiovascular examination: Secondary | ICD-10-CM | POA: Diagnosis not present

## 2011-03-27 DIAGNOSIS — N189 Chronic kidney disease, unspecified: Secondary | ICD-10-CM | POA: Diagnosis not present

## 2011-03-27 DIAGNOSIS — D72829 Elevated white blood cell count, unspecified: Secondary | ICD-10-CM | POA: Diagnosis present

## 2011-03-27 DIAGNOSIS — R079 Chest pain, unspecified: Secondary | ICD-10-CM | POA: Diagnosis not present

## 2011-03-27 DIAGNOSIS — I219 Acute myocardial infarction, unspecified: Secondary | ICD-10-CM | POA: Diagnosis not present

## 2011-03-27 DIAGNOSIS — Z09 Encounter for follow-up examination after completed treatment for conditions other than malignant neoplasm: Secondary | ICD-10-CM | POA: Diagnosis not present

## 2011-03-27 DIAGNOSIS — Z951 Presence of aortocoronary bypass graft: Secondary | ICD-10-CM | POA: Diagnosis not present

## 2011-03-27 DIAGNOSIS — J90 Pleural effusion, not elsewhere classified: Secondary | ICD-10-CM | POA: Diagnosis not present

## 2011-03-27 DIAGNOSIS — I10 Essential (primary) hypertension: Secondary | ICD-10-CM | POA: Diagnosis present

## 2011-03-27 DIAGNOSIS — R0602 Shortness of breath: Secondary | ICD-10-CM | POA: Diagnosis not present

## 2011-03-27 DIAGNOSIS — R51 Headache: Secondary | ICD-10-CM | POA: Diagnosis not present

## 2011-03-27 DIAGNOSIS — D62 Acute posthemorrhagic anemia: Secondary | ICD-10-CM | POA: Diagnosis not present

## 2011-03-27 DIAGNOSIS — D631 Anemia in chronic kidney disease: Secondary | ICD-10-CM | POA: Diagnosis not present

## 2011-03-27 DIAGNOSIS — I639 Cerebral infarction, unspecified: Secondary | ICD-10-CM | POA: Diagnosis present

## 2011-03-27 DIAGNOSIS — N184 Chronic kidney disease, stage 4 (severe): Secondary | ICD-10-CM | POA: Diagnosis not present

## 2011-03-27 DIAGNOSIS — I2510 Atherosclerotic heart disease of native coronary artery without angina pectoris: Secondary | ICD-10-CM | POA: Diagnosis not present

## 2011-03-27 DIAGNOSIS — N259 Disorder resulting from impaired renal tubular function, unspecified: Secondary | ICD-10-CM | POA: Diagnosis present

## 2011-03-27 DIAGNOSIS — K219 Gastro-esophageal reflux disease without esophagitis: Secondary | ICD-10-CM | POA: Diagnosis not present

## 2011-03-27 DIAGNOSIS — I129 Hypertensive chronic kidney disease with stage 1 through stage 4 chronic kidney disease, or unspecified chronic kidney disease: Secondary | ICD-10-CM | POA: Diagnosis not present

## 2011-03-27 DIAGNOSIS — M109 Gout, unspecified: Secondary | ICD-10-CM | POA: Diagnosis present

## 2011-03-27 DIAGNOSIS — J9819 Other pulmonary collapse: Secondary | ICD-10-CM | POA: Diagnosis not present

## 2011-03-27 HISTORY — DX: Sleep apnea, unspecified: G47.30

## 2011-03-27 HISTORY — DX: Personal history of other diseases of the digestive system: Z87.19

## 2011-03-27 HISTORY — DX: Atherosclerotic heart disease of native coronary artery without angina pectoris: I25.10

## 2011-03-27 HISTORY — DX: Cerebral infarction, unspecified: I63.9

## 2011-03-27 HISTORY — DX: Unspecified convulsions: R56.9

## 2011-03-27 LAB — URINALYSIS, ROUTINE W REFLEX MICROSCOPIC
Bilirubin Urine: NEGATIVE
Nitrite: NEGATIVE
Protein, ur: >300 mg/dL — AB
Specific Gravity, Urine: 1.012 (ref 1.005–1.030)
Urobilinogen, UA: 0.2 mg/dL (ref 0.0–1.0)

## 2011-03-27 LAB — CBC
HCT: 34.4 % — ABNORMAL LOW (ref 39.0–52.0)
Hemoglobin: 11.6 g/dL — ABNORMAL LOW (ref 13.0–17.0)
MCH: 29.3 pg (ref 26.0–34.0)
MCHC: 33.7 g/dL (ref 30.0–36.0)
MCV: 86.9 fL (ref 78.0–100.0)

## 2011-03-27 LAB — CARDIAC PANEL(CRET KIN+CKTOT+MB+TROPI)
CK, MB: 71.8 ng/mL (ref 0.3–4.0)
Total CK: 667 U/L — ABNORMAL HIGH (ref 7–232)
Total CK: 797 U/L — ABNORMAL HIGH (ref 7–232)

## 2011-03-27 LAB — COMPREHENSIVE METABOLIC PANEL
AST: 58 U/L — ABNORMAL HIGH (ref 0–37)
Albumin: 3.3 g/dL — ABNORMAL LOW (ref 3.5–5.2)
Alkaline Phosphatase: 63 U/L (ref 39–117)
BUN: 37 mg/dL — ABNORMAL HIGH (ref 6–23)
CO2: 24 mEq/L (ref 19–32)
Calcium: 9.7 mg/dL (ref 8.4–10.5)
Chloride: 103 mEq/L (ref 96–112)
GFR calc Af Amer: 20 mL/min — ABNORMAL LOW (ref >90)
GFR calc non Af Amer: 17 mL/min — ABNORMAL LOW (ref >90)
Glucose, Bld: 170 mg/dL — ABNORMAL HIGH (ref 70–99)
Potassium: 4 mEq/L (ref 3.5–5.1)
Total Bilirubin: 0.3 mg/dL (ref 0.3–1.2)
Total Protein: 7.7 g/dL (ref 6.0–8.3)

## 2011-03-27 LAB — PROTIME-INR: Prothrombin Time: 14.7 seconds (ref 11.6–15.2)

## 2011-03-27 LAB — URINE MICROSCOPIC-ADD ON

## 2011-03-27 LAB — DIFFERENTIAL
Basophils Relative: 1 % (ref 0–1)
Eosinophils Absolute: 0.2 10*3/uL (ref 0.0–0.7)
Lymphocytes Relative: 13 % (ref 12–46)
Lymphs Abs: 1.3 10*3/uL (ref 0.7–4.0)
Neutrophils Relative %: 76 % (ref 43–77)

## 2011-03-27 LAB — HEPARIN LEVEL (UNFRACTIONATED): Heparin Unfractionated: 0.17 IU/mL — ABNORMAL LOW (ref 0.30–0.70)

## 2011-03-27 LAB — PRO B NATRIURETIC PEPTIDE: Pro B Natriuretic peptide (BNP): 459.3 pg/mL — ABNORMAL HIGH (ref 0–125)

## 2011-03-27 MED ORDER — SODIUM CHLORIDE 0.9 % IV SOLN
INTRAVENOUS | Status: DC
  Administered 2011-03-27 – 2011-03-28 (×2): 50 mL/h via INTRAVENOUS

## 2011-03-27 MED ORDER — HEPARIN (PORCINE) IN NACL 100-0.45 UNIT/ML-% IJ SOLN
1300.0000 [IU]/h | INTRAMUSCULAR | Status: DC
  Administered 2011-03-27: 1000 [IU]/h via INTRAVENOUS
  Filled 2011-03-27 (×2): qty 250

## 2011-03-27 MED ORDER — ASPIRIN 325 MG PO TABS
325.0000 mg | ORAL_TABLET | Freq: Every day | ORAL | Status: DC
  Administered 2011-03-27 – 2011-03-31 (×5): 325 mg via ORAL
  Filled 2011-03-27 (×6): qty 1

## 2011-03-27 MED ORDER — COLCHICINE 0.6 MG PO TABS
0.6000 mg | ORAL_TABLET | Freq: Every day | ORAL | Status: DC | PRN
  Administered 2011-03-31: 0.6 mg via ORAL
  Filled 2011-03-27: qty 1

## 2011-03-27 MED ORDER — SODIUM CHLORIDE 0.9 % IJ SOLN: 3.0000 mL | INTRAMUSCULAR | Status: DC | PRN

## 2011-03-27 MED ORDER — HEPARIN SOD (PORCINE) IN D5W 100 UNIT/ML IV SOLN
INTRAVENOUS | Status: AC
  Filled 2011-03-27: qty 250

## 2011-03-27 MED ORDER — NITROGLYCERIN 0.4 MG SL SUBL
0.4000 mg | SUBLINGUAL_TABLET | SUBLINGUAL | Status: AC | PRN
  Administered 2011-03-27 – 2011-03-29 (×3): 0.4 mg via SUBLINGUAL
  Filled 2011-03-27 (×2): qty 25

## 2011-03-27 MED ORDER — SODIUM CHLORIDE 0.9 % IV SOLN
250.0000 mL | INTRAVENOUS | Status: DC | PRN
  Administered 2011-03-27: 10 mL/h via INTRAVENOUS

## 2011-03-27 MED ORDER — NITROGLYCERIN IN D5W 200-5 MCG/ML-% IV SOLN
125.0000 ug/min | INTRAVENOUS | Status: DC
  Administered 2011-03-27: 5 ug/min via INTRAVENOUS
  Administered 2011-03-28: 45 ug/min via INTRAVENOUS
  Administered 2011-03-29: 125 ug/min via INTRAVENOUS
  Administered 2011-03-29: 20 ug/min via INTRAVENOUS
  Administered 2011-03-30 – 2011-04-01 (×7): 125 ug/min via INTRAVENOUS
  Filled 2011-03-27 (×9): qty 250

## 2011-03-27 MED ORDER — AMLODIPINE-OLMESARTAN 10-40 MG PO TABS: 1.0000 | ORAL_TABLET | Freq: Every day | ORAL | Status: DC

## 2011-03-27 MED ORDER — AMLODIPINE BESYLATE 10 MG PO TABS
10.0000 mg | ORAL_TABLET | Freq: Every day | ORAL | Status: DC
  Administered 2011-03-27 – 2011-03-31 (×5): 10 mg via ORAL
  Filled 2011-03-27 (×6): qty 1

## 2011-03-27 MED ORDER — SODIUM CHLORIDE 0.9 % IJ SOLN: 3.0000 mL | Freq: Two times a day (BID) | INTRAMUSCULAR | Status: DC

## 2011-03-27 MED ORDER — OLMESARTAN MEDOXOMIL 40 MG PO TABS
40.0000 mg | ORAL_TABLET | Freq: Every day | ORAL | Status: DC
  Administered 2011-03-27: 40 mg via ORAL
  Filled 2011-03-27: qty 1

## 2011-03-27 MED ORDER — ONDANSETRON HCL 4 MG/2ML IJ SOLN: 4.0000 mg | Freq: Three times a day (TID) | INTRAMUSCULAR | Status: AC | PRN

## 2011-03-27 MED ORDER — BIOTENE DRY MOUTH MT LIQD
15.0000 mL | Freq: Two times a day (BID) | OROMUCOSAL | Status: DC
  Administered 2011-03-27 – 2011-04-05 (×14): 15 mL via OROMUCOSAL

## 2011-03-27 MED ORDER — ASPIRIN 81 MG PO CHEW
324.0000 mg | CHEWABLE_TABLET | Freq: Once | ORAL | Status: AC
  Administered 2011-03-27: 324 mg via ORAL
  Filled 2011-03-27: qty 4

## 2011-03-27 MED ORDER — ACETAMINOPHEN 325 MG PO TABS: 650.0000 mg | ORAL_TABLET | ORAL | Status: DC | PRN

## 2011-03-27 MED ORDER — ALISKIREN FUMARATE 150 MG PO TABS
150.0000 mg | ORAL_TABLET | Freq: Every day | ORAL | Status: DC
  Administered 2011-03-27: 150 mg via ORAL
  Filled 2011-03-27: qty 1

## 2011-03-27 MED ORDER — NITROGLYCERIN IN D5W 200-5 MCG/ML-% IV SOLN
INTRAVENOUS | Status: AC
  Filled 2011-03-27: qty 250

## 2011-03-27 MED ORDER — CLONIDINE HCL 0.2 MG PO TABS
0.2000 mg | ORAL_TABLET | Freq: Three times a day (TID) | ORAL | Status: DC
  Administered 2011-03-27 – 2011-03-31 (×15): 0.2 mg via ORAL
  Filled 2011-03-27 (×18): qty 1

## 2011-03-27 MED ORDER — HEPARIN BOLUS VIA INFUSION
3000.0000 [IU] | Freq: Once | INTRAVENOUS | Status: AC
  Administered 2011-03-27: 3000 [IU] via INTRAVENOUS
  Filled 2011-03-27: qty 3000

## 2011-03-27 MED ORDER — ROSUVASTATIN CALCIUM 20 MG PO TABS
20.0000 mg | ORAL_TABLET | Freq: Every day | ORAL | Status: DC
  Administered 2011-03-27 – 2011-04-04 (×6): 20 mg via ORAL
  Filled 2011-03-27 (×10): qty 1

## 2011-03-27 MED ORDER — METOPROLOL TARTRATE 25 MG PO TABS
25.0000 mg | ORAL_TABLET | Freq: Two times a day (BID) | ORAL | Status: DC
  Administered 2011-03-27 – 2011-03-28 (×4): 25 mg via ORAL
  Filled 2011-03-27 (×6): qty 1

## 2011-03-27 MED ORDER — SODIUM CHLORIDE 0.9 % IV SOLN: 250.0000 mL | INTRAVENOUS | Status: AC | PRN

## 2011-03-27 MED ORDER — HEPARIN BOLUS VIA INFUSION
4000.0000 [IU] | Freq: Once | INTRAVENOUS | Status: AC
  Administered 2011-03-27: 4000 [IU] via INTRAVENOUS
  Filled 2011-03-27: qty 4000

## 2011-03-27 MED ORDER — SODIUM CHLORIDE 0.9 % IJ SOLN
3.0000 mL | Freq: Two times a day (BID) | INTRAMUSCULAR | Status: DC
  Administered 2011-03-28 – 2011-03-31 (×8): 3 mL via INTRAVENOUS

## 2011-03-27 MED ORDER — SODIUM CHLORIDE 0.9 % IV SOLN: 250.0000 mL | INTRAVENOUS | Status: DC | PRN

## 2011-03-27 MED ORDER — ONDANSETRON HCL 4 MG/2ML IJ SOLN: 4.0000 mg | Freq: Four times a day (QID) | INTRAMUSCULAR | Status: DC | PRN

## 2011-03-27 MED ORDER — SODIUM CHLORIDE 0.9 % IV SOLN
INTRAVENOUS | Status: AC
  Administered 2011-03-27: 03:00:00 via INTRAVENOUS

## 2011-03-27 MED ORDER — NITROGLYCERIN IN D5W 200-5 MCG/ML-% IV SOLN
2.0000 ug/min | Freq: Once | INTRAVENOUS | Status: AC
  Administered 2011-03-27: 5 ug/min via INTRAVENOUS

## 2011-03-27 MED ORDER — TERAZOSIN HCL 5 MG PO CAPS
10.0000 mg | ORAL_CAPSULE | Freq: Every day | ORAL | Status: DC
  Administered 2011-03-27 – 2011-03-31 (×5): 10 mg via ORAL
  Filled 2011-03-27 (×5): qty 2
  Filled 2011-03-27: qty 1
  Filled 2011-03-27: qty 2

## 2011-03-27 MED ORDER — HEPARIN (PORCINE) IN NACL 100-0.45 UNIT/ML-% IJ SOLN
1750.0000 [IU]/h | INTRAMUSCULAR | Status: DC
  Administered 2011-03-28: 1750 [IU]/h via INTRAVENOUS
  Filled 2011-03-27 (×4): qty 250

## 2011-03-27 MED ORDER — FEBUXOSTAT 40 MG PO TABS
80.0000 mg | ORAL_TABLET | Freq: Every day | ORAL | Status: DC
  Administered 2011-03-27 – 2011-03-31 (×5): 80 mg via ORAL
  Filled 2011-03-27 (×6): qty 2

## 2011-03-27 NOTE — Progress Notes (Signed)
Patient is not eligible for the [employer] fund for medication assistance because he does have health insurance. Possible to find discounts on specific medications once those are determined.

## 2011-03-27 NOTE — ED Notes (Signed)
Pt reports taking benadryl x 6 tabs pta without relief.

## 2011-03-27 NOTE — Progress Notes (Signed)
  Echocardiogram 2D Echocardiogram has been performed.  [name] 03/27/2011, 10:54 AM

## 2011-03-27 NOTE — H&P (Signed)
[name] is an 51 y.o. male.   Chief Complaint: NSTEMI HPI: Pt is a  BM accepted on transfer from [name] with DX of NSTEMI. Pt was relaxing and watching TV when he became acutely SOB. The SOB sx were followed by SSCP sx described as a pressure without radiation. Pt rated the CP 8/10 with some diaphoresis noted. Pt denies any nausea and or near syncopal events. Pt denies any concerns with exercise intolerance, PND, cough and or pillow orthopnea sx. Pt is a non smoker and social drinker. Pt gives fam hx of AMI and CVA ie father. pts known ACS risk factors include HLD, CKD, prior CVA and Gout. Pt denies h/o of DM, thyroid d/z, or valvular heart d/z. Pt gives h/o of negative ETT performed at [facility] in 01/2011. Pt is currently CP free.  Past Medical History  Diagnosis Date  . ACHALASIA   . ANEMIA-NOS   . CEREBROVASCULAR ACCIDENT, HX OF   . DEGENERATIVE JOINT DISEASE, SHOULDER   . DEPRESSION   . GERD   . Headache   . HYPERLIPIDEMIA   . HYPERTENSION   . RENAL CALCULUS, HX OF   . RENAL INSUFFICIENCY   . Stroke   . Shortness of breath   . Sleep apnea   . H/O hiatal hernia   . Seizures     Past Surgical History  Procedure Date  . Tonsillectomy   . Stomach surgery 2009    achalasia  . Knee surgery 01/07/2011    right    Family History  Problem Relation Age of Onset  . Hypertension Other   . Stroke Other   . Hyperlipidemia Other    Social History:  reports that he has never smoked. He quit smokeless tobacco use about 27 years ago. His smokeless tobacco use included Chew. He reports that he drinks alcohol. He reports that he does not use illicit drugs.  Allergies:  Allergies  Allergen Reactions  . Shrimp (Shellfish Allergy) Shortness Of Breath  . Atorvastatin     REACTION: weakness  . Simvastatin     REACTION: abnormal liver tests    Medications Prior to Admission  Medication Dose Route Frequency Provider Last Rate Last Dose  . 0.9 %  sodium chloride  infusion   Intravenous Continuous Cyndra Numbers, MD 25 mL/hr at 03/27/11 0237    . aspirin chewable tablet 324 mg  324 mg Oral Once Cyndra Numbers, MD   324 mg at 03/27/11 0133  . heparin 100 UNIT/ML infusion           . heparin ADULT infusion 100 units/mL (25000 units/250 mL)  1,300 Units/hr Intravenous Continuous Colleen Can, PHARMD 13 mL/hr at 03/27/11 0253 1,300 Units/hr at 03/27/11 0253  . heparin bolus via infusion 4,000 Units  4,000 Units Intravenous Once Cyndra Numbers, MD   4,000 Units at 03/27/11 0243  . nitroGLYCERIN (NITROSTAT) SL tablet 0.4 mg  0.4 mg Sublingual Q5 min PRN Cyndra Numbers, MD   0.4 mg at 03/27/11 0156  . nitroGLYCERIN 0.2 mg/mL in dextrose 5 % infusion  2-200 mcg/min Intravenous Once Cyndra Numbers, MD 1.5 mL/hr at 03/27/11 0237 5 mcg/min at 03/27/11 0237  . ondansetron (ZOFRAN) injection 4 mg  4 mg Intravenous Q8H PRN Cyndra Numbers, MD       Medications Prior to Admission  Medication Sig Dispense Refill  . aliskiren (TEKTURNA) 150 MG tablet Take 150 mg by mouth daily.        Marland Kitchen amLODipine-olmesartan (AZOR)  10-40 MG per tablet Take 1 tablet by mouth daily.        Marland Kitchen aspirin 325 MG tablet Take 325 mg by mouth daily.        . cloNIDine (CATAPRES) 0.2 MG tablet Take 0.2 mg by mouth 3 (three) times daily.        . colchicine 0.6 MG tablet Take 0.6 mg by mouth daily as needed.        . febuxostat (ULORIC) 40 MG tablet Take 80 mg by mouth daily.        . indomethacin (INDOCIN) 25 MG capsule Take 25 mg by mouth as needed.        . rosuvastatin (CRESTOR) 20 MG tablet Take 20 mg by mouth at bedtime.        Marland Kitchen terazosin (HYTRIN) 10 MG capsule Take 10 mg by mouth at bedtime.          Results for orders placed during the hospital encounter of 03/27/11 (from the past 48 hour(s))  URINALYSIS, ROUTINE W REFLEX MICROSCOPIC     Status: Abnormal   Collection Time   03/27/11  1:35 AM      Component Value Range Comment   Color, Urine YELLOW  YELLOW     APPearance CLEAR  CLEAR     Specific  Gravity, Urine 1.012  1.005 - 1.030     pH 6.5  5.0 - 8.0     Glucose, UA NEGATIVE  NEGATIVE (mg/dL)    Hgb urine dipstick SMALL (*) NEGATIVE     Bilirubin Urine NEGATIVE  NEGATIVE     Ketones, ur NEGATIVE  NEGATIVE (mg/dL)    Protein, ur >161 (*) NEGATIVE (mg/dL)    Urobilinogen, UA 0.2  0.0 - 1.0 (mg/dL)    Nitrite NEGATIVE  NEGATIVE     Leukocytes, UA NEGATIVE  NEGATIVE    URINE MICROSCOPIC-ADD ON     Status: Abnormal   Collection Time   03/27/11  1:35 AM      Component Value Range Comment   Squamous Epithelial / LPF RARE  RARE     WBC, UA 0-2  <3 (WBC/hpf)    RBC / HPF 3-6  <3 (RBC/hpf)    Bacteria, UA RARE  RARE     Casts HYALINE CASTS (*) NEGATIVE  GRANULAR CAST  CBC     Status: Abnormal   Collection Time   03/27/11  1:36 AM      Component Value Range Comment   WBC 10.0  4.0 - 10.5 (K/uL)    RBC 3.96 (*) 4.22 - 5.81 (MIL/uL)    Hemoglobin 11.6 (*) 13.0 - 17.0 (g/dL)    HCT 09.6 (*) 04.5 - 52.0 (%)    MCV 86.9  78.0 - 100.0 (fL)    MCH 29.3  26.0 - 34.0 (pg)    MCHC 33.7  30.0 - 36.0 (g/dL)    RDW 40.9  81.1 - 91.4 (%)    Platelets 277  150 - 400 (K/uL)   DIFFERENTIAL     Status: Normal   Collection Time   03/27/11  1:36 AM      Component Value Range Comment   Neutrophils Relative 76  43 - 77 (%)    Neutro Abs 7.6  1.7 - 7.7 (K/uL)    Lymphocytes Relative 13  12 - 46 (%)    Lymphs Abs 1.3  0.7 - 4.0 (K/uL)    Monocytes Relative 9  3 - 12 (%)    Monocytes Absolute 0.9  0.1 - 1.0 (K/uL)    Eosinophils Relative 2  0 - 5 (%)    Eosinophils Absolute 0.2  0.0 - 0.7 (K/uL)    Basophils Relative 1  0 - 1 (%)    Basophils Absolute 0.1  0.0 - 0.1 (K/uL)   COMPREHENSIVE METABOLIC PANEL     Status: Abnormal   Collection Time   03/27/11  1:36 AM      Component Value Range Comment   Sodium 140  135 - 145 (mEq/L)    Potassium 3.6  3.5 - 5.1 (mEq/L)    Chloride 102  96 - 112 (mEq/L)    CO2 24  19 - 32 (mEq/L)    Glucose, Bld 170 (*) 70 - 99 (mg/dL)    BUN 37 (*) 6 - 23 (mg/dL)     Creatinine, Ser 1.61 (*) 0.50 - 1.35 (mg/dL)    Calcium 9.7  8.4 - 10.5 (mg/dL)    Total Protein 7.7  6.0 - 8.3 (g/dL)    Albumin 3.9  3.5 - 5.2 (g/dL)    AST 12  0 - 37 (U/L)    ALT 13  0 - 53 (U/L)    Alkaline Phosphatase 75  39 - 117 (U/L)    Total Bilirubin 0.2 (*) 0.3 - 1.2 (mg/dL)    GFR calc non Af Amer 17 (*) >90 (mL/min)    GFR calc Af Amer 20 (*) >90 (mL/min)   PRO B NATRIURETIC PEPTIDE     Status: Abnormal   Collection Time   03/27/11  1:36 AM      Component Value Range Comment   Pro B Natriuretic peptide (BNP) 459.3 (*) 0 - 125 (pg/mL)   TROPONIN I     Status: Abnormal   Collection Time   03/27/11  1:36 AM      Component Value Range Comment   Troponin I 0.41 (*) <0.30 (ng/mL)    Dg Chest 2 View  03/27/2011  *RADIOLOGY REPORT*  Clinical Data: Chest pain.  Short of breath.  CHEST - 2 VIEW  Comparison: None.  Findings:  Cardiopericardial silhouette within normal limits. Mediastinal contours normal. Trachea midline.  No airspace disease or effusion.  Low volume chest.  IMPRESSION: No active cardiopulmonary disease.  Low volumes of inspiration.  Original Report Authenticated By: Andreas Newport, M.D.    Review of Systems  Constitutional: Positive for malaise/fatigue and diaphoresis.  HENT: Negative.   Eyes: Negative.   Respiratory: Positive for shortness of breath.   Cardiovascular: Positive for chest pain.  Gastrointestinal: Negative.   Genitourinary: Negative.   Musculoskeletal: Negative.   Skin: Negative.   Neurological: Negative.   Endo/Heme/Allergies: Negative.   Psychiatric/Behavioral: Negative.     Blood pressure 150/87, pulse 61, temperature 98.8 F (37.1 C), temperature source Oral, resp. rate 19, height 6' (1.829 m), weight 117.935 kg (260 lb), SpO2 100.00%. Physical Exam  Gen: 50 y/o BM NAD Integument: W/D Neck: soft , supple without JVD Pulm: CTA Cardio: rrr without M/G/R Abd: soft , N/T Ext: non edematous LE bilat Vasc: DP pulses 2/4  bilat Neuro: CN 2 - 12 intact Psych: approipriate  Assessment/Plan 1) NSTEMI in setting of CKD, continue with ACS protocol, check 2D echo, continue with IV heparin and NTG. 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 He has had 4 strokes in the past and has rehabiltated from all of them for thge most part although he still walks with a cane. He was up visiting his family and developed acute onset of SOB and SSCP. He went to med [facility] and was traeted with 2 sl ntgs with decrease in his pain. Currently pain free on IV hep/ntg. Exam benign. EKG with NSSTTWC. Labs notable for SCr 3.8  (Chronic; he is on a renal transplant list in [place]), and mildly elevated trop .4. He just has a negative pharmacologic nuclear stress test in [place] 2 months ago. No real benefit to repeat the stress test and pt doesn't want this either. Feel best option is cath with limited contrast acknowledging the possibility of RCN. 2D echo pending. Will get Dr. Hyman Hopes to see (he has seen pt in the past).  Runell Gess 03/27/2011 12:56 PM

## 2011-03-27 NOTE — Progress Notes (Signed)
ANTICOAGULATION CONSULT NOTE - Follow Up Consult  Pharmacy Consult for heparin Indication: NSTEMI  Allergies  Allergen Reactions  . Shrimp (Shellfish Allergy) Shortness Of Breath  . Atorvastatin Other (See Comments)    REACTION: weakness  . Simvastatin Other (See Comments)    REACTION: abnormal liver tests    Patient Measurements: Height: 6\' 1"  (185.4 cm) Weight: 255 lb 8.2 oz (115.9 kg) IBW/kg (Calculated) : 79.9  Heparin wt = 105kg  Vital Signs: Temp: 98.2 F (36.8 C) (01/10 2024) Temp src: Oral (01/10 2024) BP: 146/83 mmHg (01/10 2024) Pulse Rate: 89  (01/10 2024)  Labs:  Basename 03/27/11 2221 03/27/11 1548 03/27/11 1135 03/27/11 0600 03/27/11 0136  HGB -- -- -- -- 11.6*  HCT -- -- -- -- 34.4*  PLT -- -- -- -- 277  APTT -- -- -- 57* --  LABPROT -- -- -- 14.7 --  INR -- -- -- 1.13 --  HEPARINUNFRC 0.31 -- 0.17* -- --  CREATININE -- -- -- 3.70* 3.80*  CKTOTAL -- 797* -- 667* --  CKMB -- 72.0* -- 71.8* --  TROPONINI -- 22.39* -- 14.96* 0.41*   Estimated Creatinine Clearance: 31.9 ml/min (by C-G formula based on Cr of 3.7).   Medications:  Scheduled:     . amLODipine  10 mg Oral Daily  . antiseptic oral rinse  15 mL Mouth Rinse BID  . aspirin  324 mg Oral Once  . aspirin  325 mg Oral Daily  . cloNIDine  0.2 mg Oral TID  . febuxostat  80 mg Oral Daily  . heparin  3,000 Units Intravenous Once  . heparin  4,000 Units Intravenous Once  . metoprolol tartrate  25 mg Oral Q12H  . nitroGLYCERIN  2-200 mcg/min Intravenous Once  . rosuvastatin  20 mg Oral q1800  . sodium chloride  3 mL Intravenous Q12H  . terazosin  10 mg Oral QHS  . DISCONTD: aliskiren  150 mg Oral Daily  . DISCONTD: amLODipine-olmesartan  1 tablet Oral Daily  . DISCONTD: heparin      . DISCONTD: olmesartan  40 mg Oral Daily  . DISCONTD: sodium chloride  3 mL Intravenous Q12H    Assessment: 51 yo male with NSTEMI on IV heparin. Heparin level (0.31) is at low-end of goal range.   Goal of  Therapy:  Heparin level 0.3-0.7 units/ml   Plan:  1. Increase IV heparin to 1750 units/hr to keep in-goal range.  2. Follow-up AM heparin level.   Emeline Gins 03/27/2011,11:18 PM

## 2011-03-27 NOTE — ED Provider Notes (Addendum)
History     CSN: [id number]  Arrival date & time 03/27/11  0103   First MD Initiated Contact with Patient 03/27/11 0117      Chief Complaint  Patient presents with  . Allergic Reaction  . Shortness of Breath    (Consider location/radiation/quality/duration/timing/severity/associated sxs/prior treatment) HPI Patient is a 51 year old male who presents today initially with concerns over possible allergic reaction. He complains of having shortness of breath that was followed by chest pain.  This began about 12:30. Patient has history of 4 previous strokes as well as hypertension and kidney failure. Patient is here in town visiting his sister. He actually lives in [place] and has been worked up by [facility] for possible kidney transplant. He is not on dialysis at this time. Patient has no history of diabetes and is not a smoker. He has trouble with hypertension since the age of 88. He has been checked for renal artery stenosis and does not have this. Pain is substernal and 8/10. Patient has no history of DVT or PE and has not had recent cough or fever. Patient thought he might have had an allergic reaction since he had a buffet this evening and the last time he had chest pain or shortness of breath like this was with an allergic reaction. He did not have any other accompanying changes he had a previous reaction. Prior to arrival patient had taken 6 Benadryl to without any resolution of his symptoms. There are no other associated or modifying factors. Past Medical History  Diagnosis Date  . ACHALASIA   . ALLERGIC RHINITIS   . ANEMIA-NOS   . CEREBROVASCULAR ACCIDENT, HX OF   . DEGENERATIVE JOINT DISEASE, SHOULDER   . DEPRESSION   . GERD   . Headache   . HYPERLIPIDEMIA   . HYPERTENSION   . PERIPHERAL VASCULAR DISEASE   . RENAL CALCULUS, HX OF   . RENAL INSUFFICIENCY     Past Surgical History  Procedure Date  . Tonsillectomy     Family History  Problem Relation Age of Onset  .  Hypertension Other   . Stroke Other   . Hyperlipidemia Other     History  Substance Use Topics  . Smoking status: Never Smoker   . Smokeless tobacco: Not on file  . Alcohol Use: Yes     rarely      Review of Systems  Constitutional: Negative.   HENT: Negative.   Eyes: Negative.   Respiratory: Positive for shortness of breath.   Cardiovascular: Positive for chest pain.  Gastrointestinal: Negative.   Genitourinary: Negative.   Musculoskeletal: Negative.   Skin: Negative.   Neurological: Negative.   Hematological: Negative.   Psychiatric/Behavioral: Negative.   All other systems reviewed and are negative.    Allergies  Shrimp; Atorvastatin; and Simvastatin  Home Medications   Current Outpatient Rx  Name Route Sig Dispense Refill  . ALISKIREN FUMARATE 150 MG PO TABS Oral Take 150 mg by mouth daily.      Marland Kitchen AMLODIPINE-OLMESARTAN 10-40 MG PO TABS Oral Take 1 tablet by mouth daily.      . ASPIRIN 325 MG PO TABS Oral Take 325 mg by mouth daily.      Marland Kitchen CLONIDINE HCL 0.2 MG PO TABS Oral Take 0.2 mg by mouth 3 (three) times daily.      . COLCHICINE 0.6 MG PO TABS Oral Take 0.6 mg by mouth daily as needed.      . FEBUXOSTAT 40 MG PO TABS Oral Take  80 mg by mouth daily.      . INDOMETHACIN 25 MG PO CAPS Oral Take 25 mg by mouth as needed.      Marland Kitchen ROSUVASTATIN CALCIUM 20 MG PO TABS Oral Take 20 mg by mouth at bedtime.      . TERAZOSIN HCL 10 MG PO CAPS Oral Take 10 mg by mouth at bedtime.        BP 185/104  Pulse 97  Temp(Src) 98.3 F (36.8 C) (Oral)  Resp 18  Ht 6' (1.829 m)  Wt 260 lb (117.935 kg)  BMI 35.26 kg/m2  SpO2 100%  Physical Exam  Nursing note and vitals reviewed. Constitutional: He is oriented to person, place, and time. He appears well-developed and well-nourished. No distress.  HENT:  Head: Normocephalic and atraumatic.  Eyes: Conjunctivae and EOM are normal. Pupils are equal, round, and reactive to light.  Neck: Normal range of motion.    Cardiovascular: Normal rate and regular rhythm.  Exam reveals gallop and S4.   Pulmonary/Chest: Effort normal and breath sounds normal. No respiratory distress. He has no wheezes. He has no rales.  Abdominal: Soft. Bowel sounds are normal. He exhibits no distension. There is no tenderness. There is no rebound and no guarding.  Musculoskeletal: Normal range of motion. He exhibits no edema and no tenderness.  Neurological: He is alert and oriented to person, place, and time. No cranial nerve deficit. He exhibits normal muscle tone. Coordination normal.  Skin: Skin is warm and dry. No rash noted.  Psychiatric: He has a normal mood and affect.    ED Course  Procedures (including critical care time)   Date: 03/27/2011 #1  Rate: 93  Rhythm: normal sinus rhythm  QRS Axis: normal  Intervals: normal  ST/T Wave abnormalities: nonspecific T wave changes  Conduction Disutrbances:none  Narrative Interpretation:   Old EKG Reviewed: unchanged   Date: 03/27/2011 #2  Rate: 76  Rhythm: normal sinus rhythm  QRS Axis: normal  Intervals: normal  ST/T Wave abnormalities: nonspecific T wave changes  Conduction Disutrbances:none  Narrative Interpretation: Patient now with T-wave inversions in leads 1 and aVL as well as V4 through V6 compared with previous EKG this evening  Old EKG Reviewed: changes noted    Labs Reviewed  CBC - Abnormal; Notable for the following:    RBC 3.96 (*)    Hemoglobin 11.6 (*)    HCT 34.4 (*)    All other components within normal limits  PRO B NATRIURETIC PEPTIDE - Abnormal; Notable for the following:    Pro B Natriuretic peptide (BNP) 459.3 (*)    All other components within normal limits  URINALYSIS, ROUTINE W REFLEX MICROSCOPIC - Abnormal; Notable for the following:    Hgb urine dipstick SMALL (*)    Protein, ur >300 (*)    All other components within normal limits  TROPONIN I - Abnormal; Notable for the following:    Troponin I 0.41 (*)    All other components  within normal limits  URINE MICROSCOPIC-ADD ON - Abnormal; Notable for the following:    Casts HYALINE CASTS (*) GRANULAR CAST   All other components within normal limits  DIFFERENTIAL  COMPREHENSIVE METABOLIC PANEL   Dg Chest 2 View  03/27/2011  *RADIOLOGY REPORT*  Clinical Data: Chest pain.  Short of breath.  CHEST - 2 VIEW  Comparison: None.  Findings:  Cardiopericardial silhouette within normal limits. Mediastinal contours normal. Trachea midline.  No airspace disease or effusion.  Low volume chest.  IMPRESSION:  No active cardiopulmonary disease.  Low volumes of inspiration.  Original Report Authenticated By: Andreas Newport, M.D.   CRITICAL CARE Performed by: Cyndra Numbers   Total critical care time: 30 minutes  Critical care time was exclusive of separately billable procedures and treating other patients.  Critical care was necessary to treat or prevent imminent or life-threatening deterioration.  Critical care was time spent personally by me on the following activities: development of treatment plan with patient and/or surrogate as well as nursing, discussions with consultants, evaluation of patient's response to treatment, examination of patient, obtaining history from patient or surrogate, ordering and performing treatments and interventions, ordering and review of laboratory studies, ordering and review of radiographic studies, pulse oximetry and re-evaluation of patient's condition.   1. NSTEMI (non-ST elevated myocardial infarction)       MDM  Patient was evaluated and though he was concerned about allergic reaction upon presentation patient's history was not consistent with this. He had 8/10 substernal chest pain with associated shortness of breath. Patient was placed on oxygen and given 324 mg of aspirin as well as 3 tabs of sublingual nitroglycerin. With this the patient's symptoms resolved. His blood pressure also improved to 160/100. EKG did not show any acute ischemic  changes. Cardiac workup was initiated. Chest x-ray was clear. Patient's baseline renal insufficiency with creatinine of 3.8 was reflected. There were no abnormalities of potassium. CBC was unremarkable. Patient did have and troponin of 0.41. Patient was notified of results. Repeat EKG was performed and now showed new T-wave inversions in leads 1 and aVL as well as the lateral precordial leads. Heparin was ordered in addition to nitro drip. Call was placed to cardiology. Patient was accepted by [facility] heart [facility] vascular. He will be transferred via [facility] to [facility] to step down unit for further management.        Cyndra Numbers, MD 03/27/11 0300  Cyndra Numbers, MD 03/27/11 1610

## 2011-03-27 NOTE — ED Notes (Signed)
Pt reports taking the benadryl about 0015, pt now states shob is improving but still c/o chest pain.

## 2011-03-27 NOTE — ED Notes (Signed)
Pt reports eating at a buffet tonight and started having chest tightness and shob afterwards. Pt reports previous allergic reaction to shrimp which felt the same.

## 2011-03-27 NOTE — Progress Notes (Addendum)
Discussed Dr. Eliane Decree recommendations with Dr. Allyson Sabal.  Will plan cath for 03/28/11.  Will reevaluate BMP in Am, fluids during the night.  precath orders written.

## 2011-03-27 NOTE — Progress Notes (Signed)
ANTICOAGULATION CONSULT NOTE - Follow Up Consult  Pharmacy Consult for heparin Indication: NSTEMI  Allergies  Allergen Reactions  . Shrimp (Shellfish Allergy) Shortness Of Breath  . Atorvastatin Other (See Comments)    REACTION: weakness  . Simvastatin Other (See Comments)    REACTION: abnormal liver tests    Patient Measurements: Height: 6\' 1"  (185.4 cm) Weight: 255 lb 8.2 oz (115.9 kg) IBW/kg (Calculated) : 79.9  Heparin wt = 105kg  Vital Signs: Temp: 98 F (36.7 C) (01/10 1135) Temp src: Oral (01/10 1135) BP: 155/85 mmHg (01/10 1135) Pulse Rate: 74  (01/10 1135)  Labs:  Basename 03/27/11 1135 03/27/11 0600 03/27/11 0136  HGB -- -- 11.6*  HCT -- -- 34.4*  PLT -- -- 277  APTT -- 57* --  LABPROT -- 14.7 --  INR -- 1.13 --  HEPARINUNFRC 0.17* -- --  CREATININE -- 3.70* 3.80*  CKTOTAL -- 667* --  CKMB -- 71.8* --  TROPONINI -- 14.96* 0.41*   Estimated Creatinine Clearance: 31.9 ml/min (by C-G formula based on Cr of 3.7).   Medications:  Scheduled:    . aliskiren  150 mg Oral Daily  . amLODipine  10 mg Oral Daily  . antiseptic oral rinse  15 mL Mouth Rinse BID  . aspirin  324 mg Oral Once  . aspirin  325 mg Oral Daily  . cloNIDine  0.2 mg Oral TID  . febuxostat  80 mg Oral Daily  . heparin  4,000 Units Intravenous Once  . metoprolol tartrate  25 mg Oral Q12H  . nitroGLYCERIN  2-200 mcg/min Intravenous Once  . olmesartan  40 mg Oral Daily  . rosuvastatin  20 mg Oral q1800  . terazosin  10 mg Oral QHS  . DISCONTD: amLODipine-olmesartan  1 tablet Oral Daily  . DISCONTD: heparin        Assessment: 50YOM presents with SOB and chest pain, CE elevated. Started heparin gtt at [facility], 1st heparin level is subtherapeutic at 0.17. No complications noted  Goal of Therapy:  Heparin level 0.3-0.7 units/ml   Plan:  1. Heparin bolus 3000 units x 1 then increase rate 1700 units/hr.  2. Draw 8h heparin level.  Dannielle Huh 03/27/2011,12:31 PM

## 2011-03-27 NOTE — ED Notes (Signed)
[facility] here now for transport.

## 2011-03-27 NOTE — Consult Note (Signed)
Reason for Consult:Evaluation/Modification of renal injury in a patient with CKD stage 4 and anticipated contrast exposure Referring Physician: Dr. Cornelious Glenn is an 51 y.o. male.  HPI: [name] is a 51 year old African American man with past history significant for chronic kidney disease stage IV from underlying hypertension. From review of recent records available from Dr. Luanna Glenn (nephrologist in [place])-it is noted that his creatinine ranges from 3.0 up to about 3.8 reflecting a GFR range from 15-23 mL per minute. He states that he has been active on the kidney transplant list at [facility].  He was in his usual state of health up until about yesterday in the evening when watching television he experienced an episode of shortness of breath which later on became substernal tightness and evolved into substernal chest pain. He was evaluated in the emergency room and consequently noted to have had a rise of his troponin from 0.1 for all the way to 14 overnight. A diagnosis of non-ST elevation myocardial infarction was made, and he is since been started on a heparin drip as well as intravenous nitroglycerin. Plans are noted for left heart catheterization when possible. At the time of this interview, he reports that he is chest pain-free and has not had any further shortness of breath.  He reports that in the recent past blood pressures have been well controlled and further attestation is made from records available from Dr. Kem Glenn office. The patient was last seen at the [facility] office in 2010 by Dr. Elvis Glenn prior to his departure to [place]. He states that he ultimately plans on moving back to [place] however is not sure of when. He is hoping for preemptive kidney transplant prior to starting dialysis.  Past Medical History  Diagnosis Date  . ACHALASIA   . ANEMIA-NOS   . CEREBROVASCULAR ACCIDENT, HX OF   . DEGENERATIVE JOINT  DISEASE, SHOULDER   . DEPRESSION   . GERD   . Headache   . HYPERLIPIDEMIA   . HYPERTENSION   . RENAL CALCULUS, HX OF   . RENAL INSUFFICIENCY   . Stroke   . Shortness of breath   . Sleep apnea   . H/O hiatal hernia   . Seizures     Past Surgical History  Procedure Date  . Tonsillectomy   . Stomach surgery 2009    achalasia  . Knee surgery 01/07/2011    right    Family History  Problem Relation Age of Onset  . Hypertension Other   . Stroke Other   . Hyperlipidemia Other     Social History:  reports that he has never smoked. He quit smokeless tobacco use about 27 years ago. His smokeless tobacco use included Chew. He reports that he drinks alcohol. He reports that he does not use illicit drugs.  Allergies:  Allergies  Allergen Reactions  . Shrimp (Shellfish Allergy) Shortness Of Breath  . Atorvastatin Other (See Comments)    REACTION: weakness  . Simvastatin Other (See Comments)    REACTION: abnormal liver tests    Medications:  Scheduled:   . aliskiren  150 mg Oral Daily  . amLODipine  10 mg Oral Daily  . antiseptic oral rinse  15 mL Mouth Rinse BID  . aspirin  324 mg Oral Once  . aspirin  325 mg Oral Daily  . cloNIDine  0.2 mg Oral TID  . febuxostat  80 mg Oral Daily  . heparin  3,000 Units Intravenous  Once  . heparin  4,000 Units Intravenous Once  . metoprolol tartrate  25 mg Oral Q12H  . nitroGLYCERIN  2-200 mcg/min Intravenous Once  . olmesartan  40 mg Oral Daily  . rosuvastatin  20 mg Oral q1800  . terazosin  10 mg Oral QHS  . DISCONTD: amLODipine-olmesartan  1 tablet Oral Daily  . DISCONTD: heparin        Results for orders placed during the hospital encounter of 03/27/11 (from the past 48 hour(s))  URINALYSIS, ROUTINE W REFLEX MICROSCOPIC     Status: Abnormal   Collection Time   03/27/11  1:35 AM      Component Value Range Comment   Color, Urine YELLOW  YELLOW     APPearance CLEAR  CLEAR     Specific Gravity, Urine 1.012  1.005 - 1.030      pH 6.5  5.0 - 8.0     Glucose, UA NEGATIVE  NEGATIVE (mg/dL)    Hgb urine dipstick SMALL (*) NEGATIVE     Bilirubin Urine NEGATIVE  NEGATIVE     Ketones, ur NEGATIVE  NEGATIVE (mg/dL)    Protein, ur >409 (*) NEGATIVE (mg/dL)    Urobilinogen, UA 0.2  0.0 - 1.0 (mg/dL)    Nitrite NEGATIVE  NEGATIVE     Leukocytes, UA NEGATIVE  NEGATIVE    URINE MICROSCOPIC-ADD ON     Status: Abnormal   Collection Time   03/27/11  1:35 AM      Component Value Range Comment   Squamous Epithelial / LPF RARE  RARE     WBC, UA 0-2  <3 (WBC/hpf)    RBC / HPF 3-6  <3 (RBC/hpf)    Bacteria, UA RARE  RARE     Casts HYALINE CASTS (*) NEGATIVE  GRANULAR CAST  CBC     Status: Abnormal   Collection Time   03/27/11  1:36 AM      Component Value Range Comment   WBC 10.0  4.0 - 10.5 (K/uL)    RBC 3.96 (*) 4.22 - 5.81 (MIL/uL)    Hemoglobin 11.6 (*) 13.0 - 17.0 (g/dL)    HCT 81.1 (*) 91.4 - 52.0 (%)    MCV 86.9  78.0 - 100.0 (fL)    MCH 29.3  26.0 - 34.0 (pg)    MCHC 33.7  30.0 - 36.0 (g/dL)    RDW 78.2  95.6 - 21.3 (%)    Platelets 277  150 - 400 (K/uL)   DIFFERENTIAL     Status: Normal   Collection Time   03/27/11  1:36 AM      Component Value Range Comment   Neutrophils Relative 76  43 - 77 (%)    Neutro Abs 7.6  1.7 - 7.7 (K/uL)    Lymphocytes Relative 13  12 - 46 (%)    Lymphs Abs 1.3  0.7 - 4.0 (K/uL)    Monocytes Relative 9  3 - 12 (%)    Monocytes Absolute 0.9  0.1 - 1.0 (K/uL)    Eosinophils Relative 2  0 - 5 (%)    Eosinophils Absolute 0.2  0.0 - 0.7 (K/uL)    Basophils Relative 1  0 - 1 (%)    Basophils Absolute 0.1  0.0 - 0.1 (K/uL)   COMPREHENSIVE METABOLIC PANEL     Status: Abnormal   Collection Time   03/27/11  1:36 AM      Component Value Range Comment   Sodium 140  135 - 145 (mEq/L)  Potassium 3.6  3.5 - 5.1 (mEq/L)    Chloride 102  96 - 112 (mEq/L)    CO2 24  19 - 32 (mEq/L)    Glucose, Bld 170 (*) 70 - 99 (mg/dL)    BUN 37 (*) 6 - 23 (mg/dL)    Creatinine, Ser 1.61 (*) 0.50 - 1.35  (mg/dL)    Calcium 9.7  8.4 - 10.5 (mg/dL)    Total Protein 7.7  6.0 - 8.3 (g/dL)    Albumin 3.9  3.5 - 5.2 (g/dL)    AST 12  0 - 37 (U/L)    ALT 13  0 - 53 (U/L)    Alkaline Phosphatase 75  39 - 117 (U/L)    Total Bilirubin 0.2 (*) 0.3 - 1.2 (mg/dL)    GFR calc non Af Amer 17 (*) >90 (mL/min)    GFR calc Af Amer 20 (*) >90 (mL/min)   PRO B NATRIURETIC PEPTIDE     Status: Abnormal   Collection Time   03/27/11  1:36 AM      Component Value Range Comment   Pro B Natriuretic peptide (BNP) 459.3 (*) 0 - 125 (pg/mL)   TROPONIN I     Status: Abnormal   Collection Time   03/27/11  1:36 AM      Component Value Range Comment   Troponin I 0.41 (*) <0.30 (ng/mL)   MRSA PCR SCREENING     Status: Normal   Collection Time   03/27/11  5:19 AM      Component Value Range Comment   MRSA by PCR NEGATIVE  NEGATIVE    COMPREHENSIVE METABOLIC PANEL     Status: Abnormal   Collection Time   03/27/11  6:00 AM      Component Value Range Comment   Sodium 139  135 - 145 (mEq/L)    Potassium 4.0  3.5 - 5.1 (mEq/L)    Chloride 103  96 - 112 (mEq/L)    CO2 22  19 - 32 (mEq/L)    Glucose, Bld 144 (*) 70 - 99 (mg/dL)    BUN 37 (*) 6 - 23 (mg/dL)    Creatinine, Ser 0.96 (*) 0.50 - 1.35 (mg/dL)    Calcium 9.2  8.4 - 10.5 (mg/dL)    Total Protein 6.8  6.0 - 8.3 (g/dL)    Albumin 3.3 (*) 3.5 - 5.2 (g/dL)    AST 58 (*) 0 - 37 (U/L)    ALT 15  0 - 53 (U/L)    Alkaline Phosphatase 63  39 - 117 (U/L)    Total Bilirubin 0.3  0.3 - 1.2 (mg/dL)    GFR calc non Af Amer 18 (*) >90 (mL/min)    GFR calc Af Amer 20 (*) >90 (mL/min)   APTT     Status: Abnormal   Collection Time   03/27/11  6:00 AM      Component Value Range Comment   aPTT 57 (*) 24 - 37 (seconds)   PROTIME-INR     Status: Normal   Collection Time   03/27/11  6:00 AM      Component Value Range Comment   Prothrombin Time 14.7  11.6 - 15.2 (seconds)    INR 1.13  0.00 - 1.49    MAGNESIUM     Status: Normal   Collection Time   03/27/11  6:00 AM       Component Value Range Comment   Magnesium 2.2  1.5 - 2.5 (mg/dL)   CARDIAC PANEL(CRET  KIN+CKTOT+MB+TROPI)     Status: Abnormal   Collection Time   03/27/11  6:00 AM      Component Value Range Comment   Total CK 667 (*) 7 - 232 (U/L)    CK, MB 71.8 (*) 0.3 - 4.0 (ng/mL)    Troponin I 14.96 (*) <0.30 (ng/mL)    Relative Index 10.8 (*) 0.0 - 2.5    HEPARIN LEVEL (UNFRACTIONATED)     Status: Abnormal   Collection Time   03/27/11 11:35 AM      Component Value Range Comment   Heparin Unfractionated 0.17 (*) 0.30 - 0.70 (IU/mL)     Dg Chest 2 View  03/27/2011  *RADIOLOGY REPORT*  Clinical Data: Chest pain.  Short of breath.  CHEST - 2 VIEW  Comparison: None.  Findings:  Cardiopericardial silhouette within normal limits. Mediastinal contours normal. Trachea midline.  No airspace disease or effusion.  Low volume chest.  IMPRESSION: No active cardiopulmonary disease.  Low volumes of inspiration.  Original Report Authenticated By: Andreas Newport, M.D.    Review of Systems  Constitutional: Negative for fever, chills, weight loss, malaise/fatigue and diaphoresis.  HENT: Negative for hearing loss, ear pain, nosebleeds, congestion, sore throat, neck pain, tinnitus and ear discharge.   Eyes: Negative for blurred vision, double vision, photophobia, pain and discharge.  Respiratory: Positive for shortness of breath. Negative for cough, hemoptysis, sputum production, wheezing and stridor.   Cardiovascular: Positive for chest pain and leg swelling. Negative for palpitations, orthopnea, claudication and PND.  Gastrointestinal: Negative for heartburn, nausea, vomiting, abdominal pain, diarrhea, constipation, blood in stool and melena.  Genitourinary: Negative for dysuria, urgency, frequency, hematuria and flank pain.  Musculoskeletal: Positive for joint pain. Negative for myalgias, back pain and falls.       Right Ankle- Gout Flare  Skin: Negative for itching and rash.  Neurological: Positive for headaches.  Negative for dizziness, tingling, tremors, sensory change, speech change, focal weakness, seizures, loss of consciousness and weakness.  Endo/Heme/Allergies: Negative for environmental allergies and polydipsia. Does not bruise/bleed easily.  Psychiatric/Behavioral: Negative for depression, suicidal ideas, hallucinations, memory loss and substance abuse. The patient is nervous/anxious. The patient does not have insomnia.   All other systems reviewed and are negative.   Blood pressure 155/85, pulse 74, temperature 98 F (36.7 C), temperature source Oral, resp. rate 20, height 6\' 1"  (1.854 m), weight 115.9 kg (255 lb 8.2 oz), SpO2 98.00%. Physical Exam  Nursing note and vitals reviewed. Constitutional: He is oriented to person, place, and time. He appears well-developed and well-nourished. No distress.  HENT:  Head: Normocephalic and atraumatic.  Right Ear: External ear normal.  Left Ear: External ear normal.  Nose: Nose normal.  Mouth/Throat: Oropharynx is clear and moist. No oropharyngeal exudate.  Eyes: Conjunctivae and EOM are normal. Pupils are equal, round, and reactive to light. Right eye exhibits no discharge. Left eye exhibits no discharge. No scleral icterus.  Neck: Normal range of motion. Neck supple. No JVD present. No tracheal deviation present. No thyromegaly present.  Cardiovascular: Normal rate, regular rhythm, normal heart sounds and intact distal pulses.  Exam reveals no gallop and no friction rub.   No murmur heard. Respiratory: Effort normal and breath sounds normal. No stridor. No respiratory distress. He has no wheezes. He has no rales. He exhibits no tenderness.  GI: Soft. Bowel sounds are normal. He exhibits no distension and no mass. There is no tenderness. There is no rebound and no guarding.  Musculoskeletal: He exhibits tenderness. He  exhibits no edema.       Right Ankle  Lymphadenopathy:    He has no cervical adenopathy.  Neurological: He is alert and oriented to  person, place, and time. No cranial nerve deficit. Coordination normal.  Skin: Skin is warm and dry. No rash noted. He is not diaphoretic. No erythema. No pallor.  Psychiatric: He has a normal mood and affect. His behavior is normal. Thought content normal.    Assessment/Plan: 1. Chronic kidney disease stage IV: Renal function appears to be relatively stable based on his most recent records available from [place]. His estimated GFR seems to be anywhere from 15-23 mL per minute which would be in line of him actively on the list for kidney transplant. With contrast exposure, anticipate acute renal injury which assuming a volume off 25-30 mL of iso-osmolar contrast would confer a risk of 15-20% of acute renal insufficiency and a 1% to 2% risk of needing dialysis (based on the Mehran equation from [facility] [id number]:[id number]). I discussed this issue with the patient and informed him that failure to do so expose him to the high mortality associated with coronary disease that is not intervened upon. At this time, he appears to be within a fair volume status and without hypotension or heart failure. Would give him intravenous normal saline at a gentle rate of 50-75 cc an hour for at least 12 hours pre-and post procedure. The use of acetylcysteine will likely not confer much of a benefit. We'll follow this patient closely with you after he undergoes coronary angiography. No acute electrolyte issues are recognized at this time. I will hold aliskeren given its impact on the renin-angiotensin axis and possible exacerbation of acute renal insufficiency due to interference with renal blood flow. Monitor closely to see if we need to D. escalate further antihypertensive medications as hypotension puts him at a greater risk for acute renal insufficiency. Benicar has appropriately been stopped.  2. Non-ST elevation myocardial infarction: Rising troponin levels noted and plans noted by cardiology for coronary angiography-meanwhile, he  is on intravenous heparin and nitroglycerin. 3. Hypertension: Currently on nitroglycerin drip, amlodipine, aliskiren, metoprolol, clonidine with fair control- monitor for further needed to de-escalate therapy. 4. Anemia of chronic kidney disease: Hemoglobin currently at goal and without any overt loss is to indicate ESA or blood transfusion needs.  [name] K. 03/27/2011, 2:23 PM

## 2011-03-27 NOTE — ED Notes (Signed)
After taking benadryl PTA pt is fairly drowsy at this time. Pt is easily awakened and is A&O when awakened.

## 2011-03-27 NOTE — Progress Notes (Signed)
ANTICOAGULATION CONSULT NOTE - Initial Consult  Pharmacy Consult for heparin Indication: chest pain/ACS  Allergies  Allergen Reactions  . Shrimp (Shellfish Allergy) Shortness Of Breath  . Atorvastatin     REACTION: weakness  . Simvastatin     REACTION: abnormal liver tests    Patient Measurements: Height: 6' (182.9 cm) Weight: 260 lb (117.935 kg) IBW/kg (Calculated) : 77.6  Adjusted Body Weight: 103kg  Vital Signs: Temp: 98.3 F (36.8 C) (01/10 0112) Temp src: Oral (01/10 0112) BP: 158/96 mmHg (01/10 0255) Pulse Rate: 97  (01/10 0112)  Labs:  Basename 03/27/11 0136  HGB 11.6*  HCT 34.4*  PLT 277  APTT --  LABPROT --  INR --  HEPARINUNFRC --  CREATININE 3.80*  CKTOTAL --  CKMB --  TROPONINI 0.41*   Estimated Creatinine Clearance: 30.8 ml/min (by C-G formula based on Cr of 3.8).  Medical History: Past Medical History  Diagnosis Date  . ACHALASIA   . ALLERGIC RHINITIS   . ANEMIA-NOS   . CEREBROVASCULAR ACCIDENT, HX OF   . DEGENERATIVE JOINT DISEASE, SHOULDER   . DEPRESSION   . GERD   . Headache   . HYPERLIPIDEMIA   . HYPERTENSION   . PERIPHERAL VASCULAR DISEASE   . RENAL CALCULUS, HX OF   . RENAL INSUFFICIENCY     Assessment: 51yo male from out [place] town presents to [facility] c/o SOB/CP that he believed to be an allergic reaction, found with elevated troponin, to begin heparin; of note, pt has been worked up in [place] for possible kidney transplant, not currently on HD.  Goal of Therapy:  Heparin level 0.3-0.7 units/ml   Plan:  Will give heparin bolus of 4000 units followed by gtt at 1300 units/hr and monitor heparin levels and CBC.  Colleen Can PharmD BCPS 03/27/2011,2:59 AM

## 2011-03-28 ENCOUNTER — Encounter (HOSPITAL_COMMUNITY): Payer: Self-pay | Admitting: Cardiology

## 2011-03-28 ENCOUNTER — Encounter (HOSPITAL_COMMUNITY)
Admission: EM | Disposition: A | Discharge status: Home / Self Care | Payer: Self-pay | Source: Home / Self Care | Attending: Cardiothoracic Surgery

## 2011-03-28 ENCOUNTER — Other Ambulatory Visit: Payer: Self-pay

## 2011-03-28 ENCOUNTER — Encounter (HOSPITAL_COMMUNITY): Payer: Medicare Other

## 2011-03-28 DIAGNOSIS — I2510 Atherosclerotic heart disease of native coronary artery without angina pectoris: Secondary | ICD-10-CM

## 2011-03-28 DIAGNOSIS — Z01810 Encounter for preprocedural cardiovascular examination: Secondary | ICD-10-CM

## 2011-03-28 HISTORY — PX: LEFT HEART CATHETERIZATION WITH CORONARY ANGIOGRAM: SHX5451

## 2011-03-28 LAB — CARDIAC PANEL(CRET KIN+CKTOT+MB+TROPI)
CK, MB: 18.4 ng/mL (ref 0.3–4.0)
CK, MB: 16.7 ng/mL (ref 0.3–4.0)
Relative Index: 6.9 — ABNORMAL HIGH (ref 0.0–2.5)
Relative Index: 5.1 — ABNORMAL HIGH (ref 0.0–2.5)
Total CK: 605 U/L — ABNORMAL HIGH (ref 7–232)
Total CK: 362 U/L — ABNORMAL HIGH (ref 7–232)
Troponin I: 13.33 ng/mL (ref <0.30)
Troponin I: 5.44 ng/mL (ref <0.30)

## 2011-03-28 LAB — BASIC METABOLIC PANEL
BUN: 35 mg/dL — ABNORMAL HIGH (ref 6–23)
Creatinine, Ser: 3.46 mg/dL — ABNORMAL HIGH (ref 0.50–1.35)
GFR calc Af Amer: 22 mL/min — ABNORMAL LOW (ref >90)
GFR calc non Af Amer: 19 mL/min — ABNORMAL LOW (ref >90)
Glucose, Bld: 118 mg/dL — ABNORMAL HIGH (ref 70–99)

## 2011-03-28 LAB — CBC
HCT: 29.3 % — ABNORMAL LOW (ref 39.0–52.0)
Hemoglobin: 9.7 g/dL — ABNORMAL LOW (ref 13.0–17.0)
MCH: 28.9 pg (ref 26.0–34.0)
MCHC: 33.1 g/dL (ref 30.0–36.0)
MCV: 87.2 fL (ref 78.0–100.0)
RDW: 13.7 % (ref 11.5–15.5)

## 2011-03-28 LAB — HEPARIN LEVEL (UNFRACTIONATED): Heparin Unfractionated: 0.21 [IU]/mL — ABNORMAL LOW (ref 0.30–0.70)

## 2011-03-28 LAB — POCT ACTIVATED CLOTTING TIME: Activated Clotting Time: 105 seconds

## 2011-03-28 SURGERY — LEFT HEART CATHETERIZATION WITH CORONARY ANGIOGRAM
Anesthesia: LOCAL

## 2011-03-28 MED ORDER — SODIUM CHLORIDE 0.9 % IV SOLN
INTRAVENOUS | Status: DC
  Administered 2011-03-28: 20 mL/h via INTRAVENOUS
  Administered 2011-03-29 – 2011-03-30 (×3): via INTRAVENOUS

## 2011-03-28 MED ORDER — FAMOTIDINE IN NACL 20-0.9 MG/50ML-% IV SOLN: 20.0000 mg | INTRAVENOUS | Status: DC

## 2011-03-28 MED ORDER — ONDANSETRON HCL 4 MG/2ML IJ SOLN: 4.0000 mg | Freq: Four times a day (QID) | INTRAMUSCULAR | Status: DC | PRN

## 2011-03-28 MED ORDER — METOPROLOL TARTRATE 1 MG/ML IV SOLN
INTRAVENOUS | Status: AC
  Administered 2011-03-28: 5 mg
  Filled 2011-03-28: qty 5

## 2011-03-28 MED ORDER — FEBUXOSTAT 40 MG PO TABS: 80.0000 mg | ORAL_TABLET | Freq: Every day | ORAL | Status: DC

## 2011-03-28 MED ORDER — FAMOTIDINE IN NACL 20-0.9 MG/50ML-% IV SOLN
20.0000 mg | INTRAVENOUS | Status: AC
  Administered 2011-03-28: 20 mg via INTRAVENOUS
  Filled 2011-03-28: qty 50

## 2011-03-28 MED ORDER — HYDROMORPHONE HCL PF 1 MG/ML IJ SOLN
1.0000 mg | Freq: Once | INTRAMUSCULAR | Status: AC
  Administered 2011-03-28: 1 mg via INTRAVENOUS

## 2011-03-28 MED ORDER — HEPARIN (PORCINE) IN NACL 100-0.45 UNIT/ML-% IJ SOLN
1950.0000 [IU]/h | INTRAMUSCULAR | Status: DC
  Administered 2011-03-29: 1750 [IU]/h via INTRAVENOUS
  Filled 2011-03-28 (×3): qty 250

## 2011-03-28 MED ORDER — HYDROMORPHONE HCL PF 1 MG/ML IJ SOLN
INTRAMUSCULAR | Status: AC
  Administered 2011-03-28: 1 mg via INTRAVENOUS
  Filled 2011-03-28: qty 1

## 2011-03-28 MED ORDER — MORPHINE SULFATE 2 MG/ML IJ SOLN
INTRAMUSCULAR | Status: AC
  Administered 2011-03-28: 2 mg via INTRAMUSCULAR
  Filled 2011-03-28: qty 1

## 2011-03-28 MED ORDER — METOPROLOL TARTRATE 1 MG/ML IV SOLN: 5.0000 mg | Freq: Once | INTRAVENOUS | Status: AC

## 2011-03-28 MED ORDER — OMEGA-3-ACID ETHYL ESTERS 1 G PO CAPS
3.0000 g | ORAL_CAPSULE | Freq: Every day | ORAL | Status: DC
  Administered 2011-03-28 – 2011-03-31 (×4): 3 g via ORAL
  Filled 2011-03-28 (×5): qty 3

## 2011-03-28 MED ORDER — DIAZEPAM 5 MG PO TABS: 5.0000 mg | ORAL_TABLET | ORAL | Status: DC

## 2011-03-28 MED ORDER — HEPARIN (PORCINE) IN NACL 100-0.45 UNIT/ML-% IJ SOLN
1750.0000 [IU]/h | INTRAMUSCULAR | Status: DC
  Administered 2011-03-28: 1500 [IU]/h via INTRAVENOUS
  Filled 2011-03-28: qty 250

## 2011-03-28 MED ORDER — NITROGLYCERIN IN D5W 200-5 MCG/ML-% IV SOLN: 2.0000 ug/min | INTRAVENOUS | Status: DC

## 2011-03-28 MED ORDER — METOPROLOL TARTRATE 1 MG/ML IV SOLN
5.0000 mg | Freq: Once | INTRAVENOUS | Status: AC
  Administered 2011-03-28 (×2): 5 mg via INTRAVENOUS

## 2011-03-28 MED ORDER — HYDROMORPHONE HCL PF 1 MG/ML IJ SOLN
1.0000 mg | INTRAMUSCULAR | Status: DC | PRN
  Administered 2011-03-29 – 2011-03-31 (×3): 1 mg via INTRAVENOUS
  Filled 2011-03-28 (×3): qty 1

## 2011-03-28 MED ORDER — MORPHINE SULFATE 2 MG/ML IJ SOLN
2.0000 mg | INTRAMUSCULAR | Status: AC | PRN
  Administered 2011-03-28 (×2): 2 mg via INTRAMUSCULAR
  Administered 2011-03-29: 2 mg via INTRAVENOUS
  Filled 2011-03-28 (×2): qty 1

## 2011-03-28 MED ORDER — ASPIRIN EC 325 MG PO TBEC: 325.0000 mg | DELAYED_RELEASE_TABLET | Freq: Every day | ORAL | Status: DC

## 2011-03-28 MED ORDER — ASPIRIN 81 MG PO CHEW
324.0000 mg | CHEWABLE_TABLET | ORAL | Status: AC
  Administered 2011-03-28: 324 mg via ORAL
  Filled 2011-03-28: qty 4

## 2011-03-28 MED ORDER — HEPARIN (PORCINE) IN NACL 2-0.9 UNIT/ML-% IJ SOLN
INTRAMUSCULAR | Status: AC
  Filled 2011-03-28: qty 1000

## 2011-03-28 MED ORDER — METHYLPREDNISOLONE SODIUM SUCC 125 MG IJ SOLR
60.0000 mg | INTRAMUSCULAR | Status: AC
  Administered 2011-03-28: 60 mg via INTRAVENOUS
  Filled 2011-03-28: qty 2

## 2011-03-28 MED ORDER — FENTANYL CITRATE 0.05 MG/ML IJ SOLN
INTRAMUSCULAR | Status: AC
  Filled 2011-03-28: qty 2

## 2011-03-28 MED ORDER — ASPIRIN 81 MG PO CHEW: 324.0000 mg | CHEWABLE_TABLET | ORAL | Status: DC

## 2011-03-28 MED ORDER — NITROGLYCERIN 0.2 MG/ML ON CALL CATH LAB
INTRAVENOUS | Status: AC
  Filled 2011-03-28: qty 1

## 2011-03-28 MED ORDER — METOPROLOL TARTRATE 1 MG/ML IV SOLN
INTRAVENOUS | Status: AC
  Administered 2011-03-28: 5 mg via INTRAVENOUS
  Filled 2011-03-28: qty 5

## 2011-03-28 MED ORDER — ACETAMINOPHEN 325 MG PO TABS
650.0000 mg | ORAL_TABLET | ORAL | Status: DC | PRN
  Administered 2011-03-31 (×3): 650 mg via ORAL
  Filled 2011-03-28 (×3): qty 2

## 2011-03-28 MED ORDER — LIDOCAINE HCL (PF) 1 % IJ SOLN
INTRAMUSCULAR | Status: AC
  Filled 2011-03-28: qty 30

## 2011-03-28 MED ORDER — DIPHENHYDRAMINE HCL 50 MG/ML IJ SOLN
25.0000 mg | INTRAMUSCULAR | Status: AC
  Administered 2011-03-28: 25 mg via INTRAVENOUS
  Filled 2011-03-28: qty 1

## 2011-03-28 MED ORDER — DIAZEPAM 5 MG PO TABS
5.0000 mg | ORAL_TABLET | ORAL | Status: AC
  Administered 2011-03-28: 5 mg via ORAL
  Filled 2011-03-28: qty 1

## 2011-03-28 MED ORDER — ACTIVE PARTNERSHIP FOR HEALTH OF YOUR HEART BOOK
Freq: Once | Status: DC
  Filled 2011-03-28: qty 1

## 2011-03-28 MED ORDER — METHYLPREDNISOLONE SODIUM SUCC 125 MG IJ SOLR: 60.0000 mg | INTRAMUSCULAR | Status: DC

## 2011-03-28 MED ORDER — HEPARIN (PORCINE) IN NACL 100-0.45 UNIT/ML-% IJ SOLN
1500.0000 [IU]/h | INTRAMUSCULAR | Status: DC
  Administered 2011-03-28: 1500 [IU]/h via INTRAVENOUS
  Filled 2011-03-28 (×2): qty 250

## 2011-03-28 MED ORDER — MIDAZOLAM HCL 2 MG/2ML IJ SOLN
INTRAMUSCULAR | Status: AC
  Filled 2011-03-28: qty 2

## 2011-03-28 MED ORDER — POLYETHYLENE GLYCOL 3350 17 G PO PACK
17.0000 g | PACK | Freq: Every day | ORAL | Status: DC
  Administered 2011-03-28 – 2011-03-31 (×4): 17 g via ORAL
  Filled 2011-03-28 (×6): qty 1

## 2011-03-28 MED ORDER — SODIUM CHLORIDE 0.9 % IV SOLN
INTRAVENOUS | Status: AC
  Administered 2011-03-28: 125 mL/h via INTRAVENOUS

## 2011-03-28 NOTE — Progress Notes (Signed)
ANTICOAGULATION CONSULT NOTE - Follow Up Consult  Pharmacy Consult for: Heparin Indication: Diffuse CAD awaiting CABG   Allergies  Allergen Reactions  . Shrimp (Shellfish Allergy) Shortness Of Breath  . Atorvastatin Other (See Comments)    REACTION: weakness  . Simvastatin Other (See Comments)    REACTION: abnormal liver tests    Vital Signs: Temp: 97.6 F (36.4 C) (01/11 1537) Temp src: Oral (01/11 1537) BP: 132/68 mmHg (01/11 1825) Pulse Rate: 73  (01/11 1825)  Labs:  Basename 03/28/11 1840 03/28/11 1632 03/28/11 1349 03/28/11 0505 03/28/11 0010 03/27/11 2221 03/27/11 0600 03/27/11 0136  HGB -- -- -- 9.7* -- -- -- 11.6*  HCT -- -- -- 29.3* -- -- -- 34.4*  PLT -- -- -- 271 -- -- -- 277  APTT -- -- -- -- -- -- 57* --  LABPROT -- -- -- 14.4 -- -- 14.7 --  INR -- -- -- 1.10 -- -- 1.13 --  HEPARINUNFRC 0.21* -- -- 0.32 -- 0.31 -- --  CREATININE -- -- -- 3.46* -- -- 3.70* 3.80*  CKTOTAL -- 324* 362* -- 605* -- -- --  CKMB -- 16.7* 18.4* -- 42.0* -- -- --  TROPONINI -- 3.30* 5.44* -- 13.33* -- -- --   Estimated Creatinine Clearance: 34.3 ml/min (by C-G formula based on Cr of 3.46).   Medications:  Heparin @ 1500 units/hr  Assessment: 50yom on heparin s/p cath for diffuse CAD. He is awaiting CABG next week. Initial heparin level after heparin resumed is below goal at 0.21.   Goal of Therapy:  Heparin level 0.3-0.7 units/ml   Plan:  1) Increase heparin to 1750 units/hr (previously therapeutic at this rate) 2) Follow up heparin level in AM  Fredrik Rigger 03/28/2011,8:28 PM

## 2011-03-28 NOTE — Progress Notes (Signed)
ANTICOAGULATION CONSULT NOTE - Follow Up Consult  Pharmacy Consult for Heparin  Indication: Diffuse CAD, awaiting CVTS consult for possible CABG   Assessment: 51 yo male admitted for NSTEMI, now s/p cath found to have diffuse CAD, OHS consult now pending. Will restart IV heparin per protocol 6h post sheath removal.  Goal of Therapy:  Heparin level 0.3-0.7 units/ml   Plan:  infusion rate 1750 units/hr Daily CBC and heparin levels   Allergies  Allergen Reactions  . Shrimp (Shellfish Allergy) Shortness Of Breath  . Atorvastatin Other (See Comments)    REACTION: weakness  . Simvastatin Other (See Comments)    REACTION: abnormal liver tests    Patient Measurements: Height: 6\' 1"  (185.4 cm) Weight: 258 lb 13.1 oz (117.4 kg) IBW/kg (Calculated) : 79.9  Adjusted Body Weight:  Vital Signs: Temp: 98.3 F (36.8 C) (01/11 1151) Temp src: Oral (01/11 1151) BP: 144/89 mmHg (01/11 1215) Pulse Rate: 62  (01/11 1215)  Labs:  Basename 03/28/11 0505 03/28/11 0010 03/27/11 2221 03/27/11 1548 03/27/11 1135 03/27/11 0600 03/27/11 0136  HGB 9.7* -- -- -- -- -- 11.6*  HCT 29.3* -- -- -- -- -- 34.4*  PLT 271 -- -- -- -- -- 277  APTT -- -- -- -- -- 57* --  LABPROT 14.4 -- -- -- -- 14.7 --  INR 1.10 -- -- -- -- 1.13 --  HEPARINUNFRC 0.32 -- 0.31 -- 0.17* -- --  CREATININE 3.46* -- -- -- -- 3.70* 3.80*  CKTOTAL -- 605* -- 797* -- 667* --  CKMB -- 42.0* -- 72.0* -- 71.8* --  TROPONINI -- 13.33* -- 22.39* -- 14.96* --   Estimated Creatinine Clearance: 34.3 ml/min (by C-G formula based on Cr of 3.46).   Medications:  Scheduled:    . active partnership for health of your heart book   Does not apply Once  . amLODipine  10 mg Oral Daily  . antiseptic oral rinse  15 mL Mouth Rinse BID  . aspirin  324 mg Oral Pre-Cath  . aspirin  325 mg Oral Daily  . cloNIDine  0.2 mg Oral TID  . diazepam  5 mg Oral On Call  . diphenhydrAMINE  25 mg Intravenous On Call  . famotidine (PEPCID) IV  20 mg  Intravenous On Call  . febuxostat  80 mg Oral Daily  . fentaNYL      . heparin      . heparin  3,000 Units Intravenous Once  . lidocaine      . methylPREDNISolone (SOLU-MEDROL) injection  60 mg Intravenous On Call  . metoprolol tartrate  25 mg Oral Q12H  . midazolam      . nitroGLYCERIN      . omega-3 acid ethyl esters  3 g Oral Daily  . polyethylene glycol  17 g Oral Daily  . rosuvastatin  20 mg Oral q1800  . sodium chloride  3 mL Intravenous Q12H  . terazosin  10 mg Oral QHS  . DISCONTD: aliskiren  150 mg Oral Daily  . DISCONTD: aspirin  324 mg Oral Pre-Cath  . DISCONTD: aspirin EC  325 mg Oral Daily  . DISCONTD: diazepam  5 mg Oral On Call  . DISCONTD: diazepam  5 mg Oral On Call  . DISCONTD: diazepam  5 mg Oral On Call  . DISCONTD: famotidine (PEPCID) IV  20 mg Intravenous On Call  . DISCONTD: febuxostat  80 mg Oral Daily  . DISCONTD: methylPREDNISolone (SOLU-MEDROL) injection  60 mg Intravenous On Call  .  DISCONTD: olmesartan  40 mg Oral Daily  . DISCONTD: sodium chloride  3 mL Intravenous Q12H  Severiano Gilbert 03/28/2011,12:30 PM

## 2011-03-28 NOTE — Progress Notes (Signed)
Pt. Back in 2900 with chest pain.  8-9/10.  Total of 4 mg IV morphine given BP elevated, 5 mg. Lopressor given. Continued pain.  Dilaudid 1 mg. Given. Sharp pain. Not as bad as admit.  NTG drip has been titrated. Dr. Tresa Endo aware and has reviewed EKG.  No acute ST elevation.   Evaluated w Nada Boozer NP. Chest pain finally subsiding as BP is reducing post opiates and beta blocker. ECG actually looks better.  Thurmon Fair, MD, [facility] [phone] 03/28/2011, 7:20 PM

## 2011-03-28 NOTE — Consult Note (Signed)
Cardiac surgical consultation  Reason for consultation severe three-vessel coronary disease, unstable angina, non-stem he MI  Physician requesting consultation Aleen Campi M.D.  History of present illness        I was asked to reevaluate this 51 year old nondiabetic African American male for possible multivessel bypass grafting after being recently diagnosed with severe multivessel coronary disease. The patient presented to the hospital for urgent care approximately 3 days ago with severe shortness of breath and positive cardiac enzymes. Troponin level peaked at 24.2. His creatinine was elevated at 3.8 so he was evaluated by renal prior to cardiac cath. A 2-D echo showed normal LV function without significant valve disease. He underwent cardiac cath today demonstrating severe multivessel diffuse disease. LVEDP was 15 and the LIMA injection was widely patent. Because of his multivessel disease cardiac surgical consultation was requested.  Past medical history  1 hypertension 2 chronic renal insufficiency  3 history of achalasia status post myotomy via the abdomen 4 history of lacunar strokes for which he uses a cane 5 chronic anemia 6 gout 7 sleep apnea            Family history positive for coronary disease myocardial infarction. No one in his family has had heart bypass surgery. No family history of diabetes  Social history negative for smoking positive for social alcohol intake   his sister is his support person.  Review of systems         Constitutional review negative for fever positive for fatigue and malaise HEENT negative for dental complaints or sore throat Thoracic negative for chest trauma chest x-ray clear no recent upper respiratory infection GI no jaundice no GI bleed no ulcer disease Cardiac severe multivessel coronary disease suboptimal targets for grafting no valvular disease good LV function Musculoskeletal arthritis in the shoulders gout in the right  foot Endocrine negative for diabetes Psychologic depression Urologic history kidney stones chronic renal failure the patient is on the kidney transplant waiting list at [facility]  Physical exam  6 foot 1 weight 250 blood pressure 150/95 pulse sinus 70 saturation 97% room air he is afebrile  Gen. appearance that of a middle-aged [place] male the CCU no acute distress HEENT exam is  normocephalic pupils are equal Neck without JVD mass or carotid bruit Thorax no deformity clear breath sounds Cardiac regular rhythm without murmur or gallop Abdomen soft laparoscopy scar no pulsatile mass Extremities no evidence of venous insufficiency or tenderness Vascular positive pulses Psychologic appropriate mood  Laboratory data  . The coronary arteriogram was 2-D echo and laboratory data. The patient has preserved LV function but severe three-vessel disease with recent MI. He has significant renal insufficiency with a creatinine of 3.4 his targets are graftable but suboptimal due to the diffuse nature of the disease.  Plan Will prepare the patient for multivessel bypass grafting when his creatinine normalizes following the cath probably mid next week. This plan was discussed with the patient and his sister and they understand and agree.

## 2011-03-28 NOTE — Progress Notes (Signed)
Subjective: No chest pain. No complaints.   Objective: Vital signs in last 24 hours: Temp:  [97.5 F (36.4 C)-98.4 F (36.9 C)] 97.9 F (36.6 C) (01/11 0411) Pulse Rate:  [62-89] 89  (01/10 2024) Resp:  [15-21] 18  (01/11 0700) BP: (135-173)/(81-103) 135/81 mmHg (01/11 0411) SpO2:  [96 %-99 %] 99 % (01/11 0411) Weight:  [117.4 kg (258 lb 13.1 oz)] 117.4 kg (258 lb 13.1 oz) (01/11 0500) Weight change: -0.535 kg (-1 lb 2.9 oz) Last BM Date: 03/26/11 Intake/Output from previous day:  -1823 01/10 0701 - 01/11 0700 In: 1809.9 [P.O.:680; I.V.:1129.9] Out: 3580 [Urine:3580] Intake/Output this shift:      . active partnership for health of your heart book   Does not apply Once  . amLODipine  10 mg Oral Daily  . antiseptic oral rinse  15 mL Mouth Rinse BID  . aspirin  324 mg Oral Pre-Cath  . aspirin  325 mg Oral Daily  . cloNIDine  0.2 mg Oral TID  . diazepam  5 mg Oral On Call  . diphenhydrAMINE  25 mg Intravenous On Call  . famotidine (PEPCID) IV  20 mg Intravenous On Call  . febuxostat  80 mg Oral Daily  . heparin  3,000 Units Intravenous Once  . methylPREDNISolone (SOLU-MEDROL) injection  60 mg Intravenous On Call  . metoprolol tartrate  25 mg Oral Q12H  . rosuvastatin  20 mg Oral q1800  . sodium chloride  3 mL Intravenous Q12H  . terazosin  10 mg Oral QHS  . DISCONTD: aliskiren  150 mg Oral Daily  . DISCONTD: aspirin  324 mg Oral Pre-Cath  . DISCONTD: diazepam  5 mg Oral On Call  . DISCONTD: diazepam  5 mg Oral On Call  . DISCONTD: diazepam  5 mg Oral On Call  . DISCONTD: famotidine (PEPCID) IV  20 mg Intravenous On Call  . DISCONTD: methylPREDNISolone (SOLU-MEDROL) injection  60 mg Intravenous On Call  . DISCONTD: olmesartan  40 mg Oral Daily  . DISCONTD: sodium chloride  3 mL Intravenous Q12H    PE: General:A&O, pleasant affect.  Heart:S1S2RRR no murmur, gallup, rub or click Lungs:clear. Abd:+ BS, soft, non tender Ext:No edema, 2+ pedal pulses.   Lab  Results:  [facility] 03/28/11 0505 03/27/11 0136  WBC 9.0 10.0  HGB 9.7* 11.6*  HCT 29.3* 34.4*  PLT 271 277   BMET  Basename 03/28/11 0505 03/27/11 0600  NA 138 139  K 4.1 4.0  CL 106 103  CO2 21 22  GLUCOSE 118* 144*  BUN 35* 37*  CREATININE 3.46* 3.70*  CALCIUM 9.2 9.2    Basename 03/28/11 0010 03/27/11 1548  TROPONINI 13.33* 22.39*    Lab Results  Component Value Date   CHOL 206* 06/23/2008   HDL 56.60 06/23/2008   LDLDIRECT 129.4 06/23/2008   TRIG 91.0 06/23/2008   CHOLHDL 4 06/23/2008   No results found for this basename: HGBA1C     Lab Results  Component Value Date   TSH 0.506 03/27/2011    Hepatic Function Panel  Basename 03/27/11 0600  PROT 6.8  ALBUMIN 3.3*  AST 58*  ALT 15  ALKPHOS 63  BILITOT 0.3  BILIDIR --  IBILI --   No results found for this basename: CHOL in the last 72 hours No results found for this basename: PROTIME in the last 72 hours    EKG: Orders placed during the hospital encounter of 03/27/11  . EKG 12-LEAD  . EKG 12-LEAD  Studies/Results: Dg Chest 2 View  03/27/2011  *RADIOLOGY REPORT*  Clinical Data: Chest pain.  Short of breath.  CHEST - 2 VIEW  Comparison: None.  Findings:  Cardiopericardial silhouette within normal limits. Mediastinal contours normal. Trachea midline.  No airspace disease or effusion.  Low volume chest.  IMPRESSION: No active cardiopulmonary disease.  Low volumes of inspiration.  Original Report Authenticated By: Andreas Newport, M.D.    Medications: I have reviewed the patient's current medications.  Assessment/Plan: Patient Active Problem List  Diagnoses  . HYPERLIPIDEMIA  . ANEMIA-NOS  . DEPRESSION  . HYPERTENSION  . PERIPHERAL VASCULAR DISEASE  . HEMORRHOIDS  . ALLERGIC RHINITIS  . ACHALASIA  . GERD  . RENAL INSUFFICIENCY, Cr 3.7  . DEGENERATIVE JOINT DISEASE, SHOULDER  . HEADACHE  . CEREBROVASCULAR ACCIDENT, HX OF CVA X 4  . RENAL CALCULUS, HX OF  . NSTEMI (non-ST elevated myocardial  infarction) pk Troponin 22.9  . Gout   PLAN:  Poss. Cardiac cath today.  Dr. Tresa Endo to see and decide.  Has rec'd fluids all night at 50cc/hr with some improvement in Cr.   EKG sinus brady wjth deep t wave inversions V3-V6. Hgb down to 9.7 ? Due to hydration.  Continues on IV Heparin.  IV NTG was stopped at 0300.   LOS: 1 day   [name],[name] 03/28/2011, 8:06 AM     Patient seen and examined. Agree with assessment and plan. ECG show deeply inverted evolutionary T wave inversion anteriorly c/w NSTEM anterior MI; suspect high grade LAD lesion. Discussed need for cath and possible PCI.  Pt understands potential increased renal risk with potential for dialysis. Will need to hydrate aggressively.  Nl LV fxn on echo yesterday. ACE-I has been dc'd.  Lennette Bihari, MD, [facility] 03/28/2011 8:38 AM

## 2011-03-28 NOTE — Brief Op Note (Signed)
03/27/2011 - 03/28/2011  11:03 AM  PATIENT:  [name]  51 y.o. male  PRE-OPERATIVE DIAGNOSIS:  NSTEMI  Full note dictated; see diagram.   DICTATION # [id number], [id number]  Contrast limited to 40 cc.  Will obtain TTS consult for possible CABG due to diffuse CAD.  Lennette Bihari, MD, [facility] 03/28/2011 11:03 AM

## 2011-03-28 NOTE — Progress Notes (Signed)
   CARE MANAGEMENT NOTE 03/28/2011  Patient:  [name], [name]   Account Number:  [id number]  Date Initiated:  03/28/2011  Documentation initiated by:  GRAVES-BIGELOW,Modelle Vollmer  Subjective/Objective Assessment:   Pt in with cp. Plan for cath today. CM unable to speak to pt due to he is in procedure at time of visit.     Action/Plan:   CM will stop back by to see if any medicaiton needs.   Anticipated DC Date:  03/29/2011   Anticipated DC Plan:  HOME/SELF CARE      DC Planning Services  CM consult      Choice offered to / List presented to:             Status of service:  In process, will continue to follow Medicare Important Message given?   (If response is "NO", the following Medicare IM given date fields will be blank) Date Medicare IM given:   Date Additional Medicare IM given:    Discharge Disposition:    Per UR Regulation:    Comments:  03-28-11 1447 Tomi Bamberger, RN,BSN [phone] CM went to speak to pt and he was resting after procedure. His mom was in the room and she stated that the pt was visiting from [place]. Per pt has only medicare A&B no part D that covers rx drug coverage. At d/c pt will need to be on all generic meds if possible for compliance. Per MD notes due to diffuse  CAD will consult with TCTS for possible CABG. CM will f/u on Monday.   03-28-11 [address], [place] [phone] CM will continue to moniotr for d/c disposition and medication needs.

## 2011-03-28 NOTE — Progress Notes (Addendum)
VASCULAR LAB PRELIMINARY  PRELIMINARY  PRELIMINARY  PRELIMINARY  Pre-op Cardiac Surgery  Carotid Findings:  Bilateral:  No evidence of hemodynamically significant internal carotid artery stenosis.   Vertebral artery flow is antegrade.      Upper Extremity Right Left  Brachial Pressures 119 95  Radial Waveforms Triphasic Biphasic  Ulnar Waveforms Triphasic Biphasic  Palmar Arch (Allen's Test) Waveform remains normal with radial compression and obliterates with ulnar compression Waveform remains normal with radial compression and decreases >50% with ulnar compression   Findings:  Left BP is a 24 mmHg decrease compared to Right BP, with biphasic waveforms in Left brachial, radial, and ulnar arteries suggestive of stenosis in proximal upper extremity.    Lower  Extremity Right Left  Dorsalis Pedis Triphasic Triphasic  Anterior Tibial X X  Posterior Tibial Triphasic Triphasic  Ankle/Brachial Indices 1.39, Normal 1.25, Normal    Findings:  Normal ABIs   Cestone, Karsten Ro, RVT Farrel Demark, RDMS 03/28/2011, 1:43 PM

## 2011-03-28 NOTE — Progress Notes (Signed)
Renal Daily Progress Note  S:Pt states he is feeling fine, no chest pain, dyspnea, or increasing swelling in his legs.  Says he is still urinating a lot and it is clearer now than it was a day or two ago.  O:BP 135/81  Pulse 89  Temp(Src) 97.9 F (36.6 C) (Oral)  Resp 19  Ht 6\' 1"  (1.854 m)  Wt 258 lb 13.1 oz (117.4 kg)  BMI 34.15 kg/m2  SpO2 99%  Intake/Output Summary (Last 24 hours) at 03/28/11 0649 Last data filed at 03/28/11 0400  Gross per 24 hour  Intake 1621.89 ml  Output   3580 ml  Net -1958.11 ml   Weight change: -1 lb 2.9 oz (-0.535 kg) WUJ:WJXBJYNW, wakes to voice, no distress.  CVS:RRR, no m/r/g Resp:CTAB Abd:+BS, soft, non-tender Ext:No significant edema in left ankle, right ankle slight tenderness and swelling .   Lab 03/28/11 0505 03/27/11 0600 03/27/11 0136  NA 138 139 140  K 4.1 4.0 3.6  CL 106 103 102  CO2 21 22 24   GLUCOSE 118* 144* 170*  BUN 35* 37* 37*  CREATININE 3.46* 3.70* 3.80*  ALB -- -- --  CALCIUM 9.2 9.2 9.7  PHOS -- -- --  AST -- 58* 12  ALT -- 15 13   Liver Function Tests:  Lab 03/27/11 0600 03/27/11 0136  AST 58* 12  ALT 15 13  ALKPHOS 63 75  BILITOT 0.3 0.2*  PROT 6.8 7.7  ALBUMIN 3.3* 3.9   CBC:  Lab 03/28/11 0505 03/27/11 0136  WBC 9.0 10.0  NEUTROABS -- 7.6  HGB 9.7* 11.6*  HCT 29.3* 34.4*  MCV 87.2 86.9  PLT 271 277   Cardiac Enzymes:  Lab 03/28/11 0010 03/27/11 1548 03/27/11 0600 03/27/11 0136  CKTOTAL 605* 797* 667* --  CKMB 42.0* 72.0* 71.8* --  CKMBINDEX -- -- -- --  TROPONINI 13.33* 22.39* 14.96* 0.41*    Studies/Results: Dg Chest 2 View  03/27/2011  *RADIOLOGY REPORT*  Clinical Data: Chest pain.  Short of breath.  CHEST - 2 VIEW  Comparison: None.  Findings:  Cardiopericardial silhouette within normal limits. Mediastinal contours normal. Trachea midline.  No airspace disease or effusion.  Low volume chest.  IMPRESSION: No active cardiopulmonary disease.  Low volumes of inspiration.  Original Report  Authenticated By: Andreas Newport, M.D.    Current Medications:    . active partnership for health of your heart book   Does not apply Once  . amLODipine  10 mg Oral Daily  . antiseptic oral rinse  15 mL Mouth Rinse BID  . aspirin  324 mg Oral Pre-Cath  . aspirin  325 mg Oral Daily  . cloNIDine  0.2 mg Oral TID  . diazepam  5 mg Oral On Call  . diphenhydrAMINE  25 mg Intravenous On Call  . famotidine (PEPCID) IV  20 mg Intravenous On Call  . febuxostat  80 mg Oral Daily  . heparin  3,000 Units Intravenous Once  . methylPREDNISolone (SOLU-MEDROL) injection  60 mg Intravenous On Call  . metoprolol tartrate  25 mg Oral Q12H  . rosuvastatin  20 mg Oral q1800  . sodium chloride  3 mL Intravenous Q12H  . terazosin  10 mg Oral QHS  . DISCONTD: aliskiren  150 mg Oral Daily  . DISCONTD: amLODipine-olmesartan  1 tablet Oral Daily  . DISCONTD: aspirin  324 mg Oral Pre-Cath  . DISCONTD: diazepam  5 mg Oral On Call  . DISCONTD: diazepam  5 mg Oral On  Call  . DISCONTD: diazepam  5 mg Oral On Call  . DISCONTD: famotidine (PEPCID) IV  20 mg Intravenous On Call  . DISCONTD: heparin      . DISCONTD: methylPREDNISolone (SOLU-MEDROL) injection  60 mg Intravenous On Call  . DISCONTD: olmesartan  40 mg Oral Daily  . DISCONTD: sodium chloride  3 mL Intravenous Q12H      . sodium chloride 50 mL/hr (03/27/11 1814)  . heparin 1,750 Units/hr (03/28/11 0134)  . nitroGLYCERIN 5 mcg/min (03/27/11 9147)  . DISCONTD: heparin 1,300 Units/hr (03/27/11 0253)      Assessment/Plan: This is a 51 year old man with PMHx of HTN, HLD, CKD stage IV who has been transferred to [facility] for NSTEMI, in need of cardiac catheretization:   1. Chronic kidney disease stage IV: good UOP and creatinine with slight improvement with hydration overnight, no sign of fluid overload on exam.  Pt to go to Cath today, continue Normal Saline 12 hours after Cath.  No acidosis or electrolyte abnormalities to indicate need for further  medications. Monitor BP's closely as hypotension will increase his risk for kidney damage.    Renal function appears to be relatively stable based on his most recent records available from [place]. His estimated GFR seems to be anywhere from 15-23 mL per minute which would be in line of him actively on the list for kidney transplant. With contrast exposure, anticipate acute renal injury which assuming a volume off 25-30 mL of iso-osmolar contrast would confer a risk of 15-20% of acute renal insufficiency and a 1% to 2% risk of needing dialysis (based on the Mehran equation from [facility] 8295:62:1308-6).   2. Non-ST elevation myocardial infarction: troponin peaked at 22.39 yesterday evening, down to 13.33 around midnight. Cards plans for coronary angiography today -meanwhile, he is on intravenous heparin and nitroglycerin.   3. Hypertension: Currently on nitroglycerin drip, amlodipine, aliskiren, metoprolol, clonidine with fair control- monitor for further needed to de-escalate therapy.   4. Anemia of chronic kidney disease: Hemoglobin drop overnight to 9.7 without overt bleeding; repeat; check stools if has any; Would avoid transfusion when possible as patient is on a transplant list at [facility].  5. Disposition- per cardiology, we will continue to follow pt post-cath.   CHAMBERLAIN,RACHEL 03/28/2011 6:55 AM I agree with the plan as outlined by Dr. Erlene Senters.  Patient aware of risk of AKI on CKD.  Keep close watch on Hb with 2 gm drop on heparin. Ravon Mortellaro B,MD 03/28/2011 10:10 AM

## 2011-03-28 NOTE — Progress Notes (Signed)
   CARE MANAGEMENT NOTE 03/28/2011  Patient:  [name], [name]   Account Number:  [id number]  Date Initiated:  03/28/2011  Documentation initiated by:  GRAVES-BIGELOW,Sherry Blackard  Subjective/Objective Assessment:   Pt in with cp. Plan for cath today. CM unable to speak to pt due to he is in procedure at time of visit.     Action/Plan:   CM will stop back by to see if any medicaiton needs.   Anticipated DC Date:  03/29/2011   Anticipated DC Plan:  HOME/SELF CARE      DC Planning Services  CM consult      Choice offered to / List presented to:             Status of service:  In process, will continue to follow Medicare Important Message given?   (If response is "NO", the following Medicare IM given date fields will be blank) Date Medicare IM given:   Date Additional Medicare IM given:    Discharge Disposition:    Per UR Regulation:    Comments:  03-28-11 [address] Tomi Bamberger, RN,BSN [phone] CM will continue to monitor for d/c disposition and medication needs.

## 2011-03-29 ENCOUNTER — Other Ambulatory Visit: Payer: Self-pay

## 2011-03-29 LAB — RENAL FUNCTION PANEL
Albumin: 3 g/dL — ABNORMAL LOW (ref 3.5–5.2)
GFR calc Af Amer: 22 mL/min — ABNORMAL LOW (ref >90)
Phosphorus: 3.1 mg/dL (ref 2.3–4.6)
Potassium: 4.9 mEq/L (ref 3.5–5.1)
Sodium: 136 mEq/L (ref 135–145)

## 2011-03-29 LAB — CBC
MCHC: 33 g/dL (ref 30.0–36.0)
Platelets: 290 10*3/uL (ref 150–400)
RDW: 13.6 % (ref 11.5–15.5)

## 2011-03-29 LAB — HEPARIN LEVEL (UNFRACTIONATED): Heparin Unfractionated: 0.19 IU/mL — ABNORMAL LOW (ref 0.30–0.70)

## 2011-03-29 LAB — CARDIAC PANEL(CRET KIN+CKTOT+MB+TROPI)
CK, MB: 11.1 ng/mL (ref 0.3–4.0)
CK, MB: 19.3 ng/mL (ref 0.3–4.0)
Troponin I: 2.92 ng/mL (ref <0.30)
Troponin I: 4.32 ng/mL (ref <0.30)

## 2011-03-29 MED ORDER — NITROGLYCERIN 0.4 MG SL SUBL
SUBLINGUAL_TABLET | SUBLINGUAL | Status: AC
  Filled 2011-03-29: qty 25

## 2011-03-29 MED ORDER — EPTIFIBATIDE 75 MG/100ML IV SOLN
1.0000 ug/kg/min | INTRAVENOUS | Status: DC
  Filled 2011-03-29: qty 100

## 2011-03-29 MED ORDER — HEPARIN (PORCINE) IN NACL 100-0.45 UNIT/ML-% IJ SOLN
1950.0000 [IU]/h | INTRAMUSCULAR | Status: DC
  Administered 2011-03-29: 1950 [IU]/h via INTRAVENOUS
  Filled 2011-03-29 (×2): qty 250

## 2011-03-29 MED ORDER — EPTIFIBATIDE BOLUS VIA INFUSION
180.0000 ug/kg | Freq: Once | INTRAVENOUS | Status: AC
  Administered 2011-03-29: 21000 ug via INTRAVENOUS
  Filled 2011-03-29: qty 28

## 2011-03-29 MED ORDER — HEPARIN (PORCINE) IN NACL 100-0.45 UNIT/ML-% IJ SOLN
2350.0000 [IU]/h | INTRAMUSCULAR | Status: DC
  Administered 2011-03-30: 2050 [IU]/h via INTRAVENOUS
  Administered 2011-03-30: 2250 [IU]/h via INTRAVENOUS
  Administered 2011-03-31 – 2011-04-01 (×3): 2350 [IU]/h via INTRAVENOUS
  Filled 2011-03-29 (×9): qty 250

## 2011-03-29 MED ORDER — HYDRALAZINE HCL 20 MG/ML IJ SOLN
10.0000 mg | Freq: Once | INTRAMUSCULAR | Status: AC
  Administered 2011-03-29 (×2): 10 mg via INTRAVENOUS

## 2011-03-29 MED ORDER — MORPHINE SULFATE 2 MG/ML IJ SOLN
2.0000 mg | INTRAMUSCULAR | Status: AC | PRN
  Administered 2011-03-31 (×2): 2 mg via INTRAVENOUS
  Filled 2011-03-29 (×2): qty 1

## 2011-03-29 MED ORDER — SODIUM BICARBONATE 650 MG PO TABS
650.0000 mg | ORAL_TABLET | Freq: Three times a day (TID) | ORAL | Status: DC
  Administered 2011-03-29 – 2011-03-31 (×9): 650 mg via ORAL
  Filled 2011-03-29 (×12): qty 1

## 2011-03-29 MED ORDER — METOPROLOL TARTRATE 1 MG/ML IV SOLN
5.0000 mg | Freq: Once | INTRAVENOUS | Status: AC
  Administered 2011-03-29 (×2): 5 mg via INTRAVENOUS

## 2011-03-29 MED ORDER — METOPROLOL TARTRATE 50 MG PO TABS
50.0000 mg | ORAL_TABLET | Freq: Two times a day (BID) | ORAL | Status: DC
  Administered 2011-03-29: 50 mg via ORAL
  Filled 2011-03-29 (×2): qty 1

## 2011-03-29 MED ORDER — HYDRALAZINE HCL 20 MG/ML IJ SOLN
10.0000 mg | INTRAMUSCULAR | Status: DC | PRN
  Filled 2011-03-29: qty 1

## 2011-03-29 MED ORDER — NITROGLYCERIN IN D5W 200-5 MCG/ML-% IV SOLN: 2.0000 ug/min | INTRAVENOUS | Status: DC

## 2011-03-29 MED ORDER — HYDRALAZINE HCL 20 MG/ML IJ SOLN
INTRAMUSCULAR | Status: AC
  Administered 2011-03-29: 10 mg via INTRAVENOUS
  Filled 2011-03-29: qty 1

## 2011-03-29 MED ORDER — METOPROLOL TARTRATE 50 MG PO TABS
50.0000 mg | ORAL_TABLET | Freq: Three times a day (TID) | ORAL | Status: DC
  Administered 2011-03-29: 50 mg via ORAL
  Filled 2011-03-29 (×4): qty 1

## 2011-03-29 MED ORDER — METOPROLOL TARTRATE 1 MG/ML IV SOLN
INTRAVENOUS | Status: AC
  Administered 2011-03-29: 5 mg via INTRAVENOUS
  Filled 2011-03-29: qty 5

## 2011-03-29 MED ORDER — NITROGLYCERIN 0.4 MG SL SUBL
SUBLINGUAL_TABLET | SUBLINGUAL | Status: AC
  Administered 2011-03-29: 0.4 mg
  Filled 2011-03-29: qty 25

## 2011-03-29 NOTE — Progress Notes (Signed)
S: Has not had any more CP.  No nausea or vomiting.  Appetite is good. Passing clear urine without problems  O:  Medications: Infusions:    . sodium chloride 125 mL/hr (03/28/11 1222)  . sodium chloride 10 mL/hr at 03/29/11 0820  . heparin 1,950 Units/hr (03/29/11 0900)  . nitroGLYCERIN 20 mcg/min (03/29/11 0830)  . DISCONTD: sodium chloride 50 mL/hr (03/28/11 0800)  . DISCONTD: heparin Stopped (03/28/11 0947)  . DISCONTD: heparin 1,500 Units/hr (03/28/11 1254)  . DISCONTD: heparin 1,500 Units/hr (03/28/11 1303)  . DISCONTD: nitroGLYCERIN 110 mcg/min (03/28/11 1300)   Scheduled Medications:    . active partnership for health of your heart book   Does not apply Once  . amLODipine  10 mg Oral Daily  . antiseptic oral rinse  15 mL Mouth Rinse BID  . aspirin  325 mg Oral Daily  . cloNIDine  0.2 mg Oral TID  . famotidine (PEPCID) IV  20 mg Intravenous On Call  . febuxostat  80 mg Oral Daily  . fentaNYL      . heparin      .  HYDROmorphone (DILAUDID) injection  1 mg Intravenous Once  .  HYDROmorphone (DILAUDID) injection  1 mg Intravenous Once  . lidocaine      . metoprolol      . metoprolol  5 mg Intravenous Once  . metoprolol  5 mg Intravenous Once  . metoprolol tartrate  50 mg Oral Q12H  . midazolam      . morphine      . morphine      . nitroGLYCERIN      . omega-3 acid ethyl esters  3 g Oral Daily  . polyethylene glycol  17 g Oral Daily  . rosuvastatin  20 mg Oral q1800  . sodium chloride  3 mL Intravenous Q12H  . terazosin  10 mg Oral QHS  . DISCONTD: aspirin EC  325 mg Oral Daily  . DISCONTD: febuxostat  80 mg Oral Daily  . DISCONTD: metoprolol tartrate  25 mg Oral Q12H    VITAL SIGNS  BP 142/91  Pulse 65  Temp(Src) 97.5 F (36.4 C) (Oral)  Resp 18  Ht 6\' 1"  (1.854 m)  Wt 116.7 kg (257 lb 4.4 oz)  BMI 33.94 kg/m2  SpO2 98%   Intake/Output Summary (Last 24 hours) at 03/29/11 0954 Last data filed at 03/29/11 0900  Gross per 24 hour  Intake 3165.8 ml    Output   1965 ml  Net 1200.8 ml    Weight change: -0.7 kg (-1 lb 8.7 oz)  EXAM: Jovial AA male, NAD Clear S1S2 no S3  1/6 murmur as prev Soft abd No edema No peripheral stigmata of atheroemboli  Labs: Basic Metabolic Panel:  Lab 03/29/11 6295 03/28/11 0505 03/27/11 0600  NA 136 138 139  K 4.9 4.1 4.0  CL 105 106 103  CO2 19 21 22   GLUCOSE 160* 118* 144*  BUN 40* 35* 37*  CREATININE 3.45* 3.46* 3.70*  CALCIUM 9.3 9.2 9.2  MG -- -- 2.2  PHOS 3.1 -- --    Liver Function Tests:  Lab 03/29/11 0630 03/27/11 0600 03/27/11 0136  AST -- 58* 12  ALT -- 15 13  ALKPHOS -- 63 75  BILITOT -- 0.3 0.2*  PROT -- 6.8 7.7  ALBUMIN 3.0* 3.3* 3.9   No results found for this basename: LIPASE:3,AMYLASE:3 in the last 168 hours No results found for this basename: AMMONIA:3 in the last 168 hours  CBC:  Lab 03/29/11 0630 03/28/11 0505 03/27/11 0136  WBC 16.1* 9.0 10.0  NEUTROABS -- -- 7.6  HGB 10.2* 9.7* 11.6*  HCT 30.9* 29.3* 34.4*  MCV 87.0 87.2 86.9  PLT 290 271 277    Cardiac Enzymes:  Lab 03/29/11 0008 03/28/11 1632 03/28/11 1349 03/28/11 0010 03/27/11 1548  CKTOTAL 216 324* 362* 605* 797*  CKMB 11.1* 16.7* 18.4* 42.0* 72.0*  CKMBINDEX -- -- -- -- --  TROPONINI 2.92* 3.30* 5.44* 13.33* 22.39*    Iron Studies: No results found for this basename: IRON,TIBC,TRANSFERRIN,FERRITIN in the last 72 hours Assessment/Plan: This is a 51 year old man with PMHx of HTN, HLD, CKD stage IV (baseline creatinine 3.8; most recently followed in [place], relocating to [facility] ) who has been transferred to [facility] for NSTEMI, s/p cardiac cath, now in need of CABG:   1. Chronic kidney disease stage IV: Renal function stable post cath (creatinine actually slightly lower) Mildly depressed CO2 - add sodium bicarb po. (Renal function appears to be relatively stable based on his most recent records available from [place]).   2. Non-ST elevation myocardial infarction: s/p cath; 3v ds; for CABG next week     3. Hypertension: Currently on nitroglycerin drip, amlodipine,  metoprolol, clonidine with fair control- monitor for further needed to de-escalate therapy. (Aliskerin has been stopped)  4. Anemia of chronic kidney disease: Hemoglobin drop to 9-10 range without overt bleeding; repeat; check stools if has any; Would avoid transfusion when possible as patient is on a transplant list at [facility]; will check iron studies.   5. Disposition- per cardiology, we will continue to follow pt  Will discuss with Dr. Maren Beach after we see how renal function does over the weekend the pros/cons of placing a perm cath at time of CABG in case HD needed vs temp cath if develops indications.  We also need to save left arm as he will need that arm for permanent HD access.   [name]

## 2011-03-29 NOTE — Progress Notes (Addendum)
Patient was transferred to CCU for increasing angina pain - SSCP up to 8/10 & SBP up to 180s with visual "floaters".  Initial thought (prior to realizing that he has CKD III-IV) was to start Integrilin, howver, will not due to CKD.  Will also increase Heparin gtt rate as he was subtherapeutic.  Since onset of CP, he has received Dilaudid 1mg   & Morphine 2mg  (prior to transfer) as well as NTG gtt increased to 148mcg/hr.  I have given him 2 SL NTG tabs & 5mg  IV Lopressor - his pain, that had been down to 3/10 is now almost gone.  Will increase PO lopressor dose for PM & use PRN Morphine as well as NTG SL as long as BP tolerates. Currently HR ~70 with SBP 127.  ECG with onset of pain - deep Anterolateral TWI, on 2nd ECG with improved pain, TWI less pronounced.  This AM, T waves were upright.  Will recheck ECG when pain free.  Cycling cardiac enzymes.    Will contact Dr. PVT to discuss potential of CABG earlier than originally planned given recurrent post-infarct angina.  Marykay Lex, M.D., M.S. [facility] [address] [place], [place]  [address]  [phone]  03/29/2011 6:16 PM   ADDENDUM: Pain free ECG - T waves now normalized.  Marykay Lex, M.D., M.S. [facility] [address] [place], [place]  [address]  [phone]  03/29/2011 7:13 PM

## 2011-03-29 NOTE — Progress Notes (Addendum)
Heparin protocol:  HL = 0.35 goal 0.3-0.5 Awaiting CABG    Plan:  1. Heparin drip 2050 units/hr 2. F/u with AM heparin level

## 2011-03-29 NOTE — Progress Notes (Signed)
Pt called rn at 1358 with complaints of chest pain. Pt doubled over in bed with hands clutching chest, breathing heavily, diaphoretic when rn entered room.pt had been lying in bed when chest pain started.Nitro increased from 20 mcg to 25 mcg iv. bp 185/105.applied 2 liters nasal cannula ,sats 100%. At 1400 administered 2mg  morphine iv.pain still level 9/10. From 1358 to 1440 increased nitro at intervals to total of and pt still with pain of level 5/10.ekg had been obtained and called results to brian hager, pa at 1440,noting t wave changes and st depression. Order received for 10mg  hydralazine iv and adminstered at 1445. Pt still had continuing chest pain. Called brian hager,pa again and received verbal order to transfer to icu,(pa had no access to enter orders). Transferred pt to 2909 at 1545. Nitro was at at that time and pain was starting to increase again to level 8/10.  Tammy Sours

## 2011-03-29 NOTE — Progress Notes (Signed)
[facility] Progress Note  Subjective:  No further chest pain since yesterday.  Objective:   Vital Signs in the last 24 hours: Temp:  [97.6 F (36.4 C)-98.3 F (36.8 C)] 97.9 F (36.6 C) (01/12 0400) Pulse Rate:  [59-81] 65  (01/12 0700) Resp:  [11-25] 16  (01/12 0700) BP: (126-189)/(57-110) 126/81 mmHg (01/12 0400) SpO2:  [95 %-99 %] 98 % (01/12 0700) Weight:  [116.7 kg (257 lb 4.4 oz)] 116.7 kg (257 lb 4.4 oz) (01/12 0600)  Intake/Output from previous day: 01/11 0701 - 01/12 0700 In: 2925.8 [P.O.:240; I.V.:2635.8; IV Piggyback:50] Out: 2705 [Urine:2705]  Scheduled:   . active partnership for health of your heart book   Does not apply Once  . amLODipine  10 mg Oral Daily  . antiseptic oral rinse  15 mL Mouth Rinse BID  . aspirin  325 mg Oral Daily  . cloNIDine  0.2 mg Oral TID  . diazepam  5 mg Oral On Call  . diphenhydrAMINE  25 mg Intravenous On Call  . famotidine (PEPCID) IV  20 mg Intravenous On Call  . febuxostat  80 mg Oral Daily  . fentaNYL      . heparin      .  HYDROmorphone (DILAUDID) injection  1 mg Intravenous Once  .  HYDROmorphone (DILAUDID) injection  1 mg Intravenous Once  . lidocaine      . metoprolol      . metoprolol  5 mg Intravenous Once  . metoprolol  5 mg Intravenous Once  . metoprolol tartrate  25 mg Oral Q12H  . midazolam      . morphine      . morphine      . nitroGLYCERIN      . omega-3 acid ethyl esters  3 g Oral Daily  . polyethylene glycol  17 g Oral Daily  . rosuvastatin  20 mg Oral q1800  . sodium chloride  3 mL Intravenous Q12H  . terazosin  10 mg Oral QHS  . DISCONTD: aspirin EC  325 mg Oral Daily  . DISCONTD: febuxostat  80 mg Oral Daily    Physical Exam:   General appearance: alert, cooperative and no distress Neck: no carotid bruit, no JVD, supple, symmetrical, trachea midline and thyroid not enlarged, symmetric, no tenderness/mass/nodules Lungs: clear to auscultation bilaterally Heart:  S1, S2 normal 1/6sem Abdomen: soft, non-tender; bowel sounds normal; no masses,  no organomegaly R groin cath site stable Extremities: extremities normal, atraumatic, no cyanosis or edema   Rate:75  Rhythm: normal sinus rhythm  Lab Results: CBC    Component Value Date/Time   WBC 16.1* 03/29/2011 0630   RBC 3.55* 03/29/2011 0630   HGB 10.2* 03/29/2011 0630   HCT 30.9* 03/29/2011 0630   PLT 290 03/29/2011 0630   MCV 87.0 03/29/2011 0630   MCH 28.7 03/29/2011 0630   MCHC 33.0 03/29/2011 0630   RDW 13.6 03/29/2011 0630   LYMPHSABS 1.3 03/27/2011 0136   MONOABS 0.9 03/27/2011 0136   EOSABS 0.2 03/27/2011 0136   BASOSABS 0.1 03/27/2011 0136     Basename 03/28/11 0505 03/27/11 0600  NA 138 139  K 4.1 4.0  CL 106 103  CO2 21 22  GLUCOSE 118* 144*  BUN 35* 37*  CREATININE 3.46* 3.70*    Basename 03/29/11 0008 03/28/11 1632  TROPONINI 2.92* 3.30*   .  Basename 03/27/11 0600  PROT 6.8  ALBUMIN 3.3*  AST 58*  ALT 15  ALKPHOS 63  BILITOT 0.3  BILIDIR --  IBILI --    Basename 03/28/11 0505  INR 1.10   Lipid Panel     Component Value Date/Time   CHOL 206* 06/23/2008 0834   TRIG 91.0 06/23/2008 0834   HDL 56.60 06/23/2008 0834   CHOLHDL 4 06/23/2008 0834   VLDL 18.2 06/23/2008 0834       Imaging:  No results found.  Cardiac Studies:     Assessment/Plan:   Principal Problem:  *NSTEMI (non-ST elevated myocardial infarction) pk Troponin 22.9 Active Problems:  HYPERLIPIDEMIA  HYPERTENSION  RENAL INSUFFICIENCY, Cr 3.7  CEREBROVASCULAR ACCIDENT, HX OF CVA X 4  Gout  CAD (coronary artery disease), 3 vessel significant disease.    Will continue on IV NTG; increase to 20 micrograms.  Increase beta-blocker. Hydrate. Closely follow renal fxn; now off ARB and DRI. CABG next week.   Lennette Bihari, MD, [facility] 03/29/2011, 7:57 AM

## 2011-03-29 NOTE — Cardiovascular Report (Signed)
NAMECALYX, HAWKER NO.:  [id number]  MEDICAL RECORD NO.:  [id number]  LOCATION:  2921                         FACILITY:  [facility]  PHYSICIAN:  [name], M.D.     DATE OF BIRTH:  Mar 02, 1961  DATE OF PROCEDURE:  03/28/2011 DATE OF DISCHARGE:                           CARDIAC CATHETERIZATION   PROCEDURE:  Cardiac catheterization.  INDICATIONS:  [name] is a 51 year old gentleman who is from [place], but is in the process of moving to [place].  He has a history of chronic renal insufficiency and apparently has been on a transplant waiting list at [facility].  He has been followed by Dr. Elvis Coil here in [place] for his renal function.  Apparently, remotely, he also has suffered several small CVAs without residual and does not have any residual deficits.  He has a history of hypertension and hyperlipidemia.  He was admitted to [facility] on March 27, 2011.  His ECG has evolved marked anterior T-wave inversions suggesting of high-grade LAD disease.  Cardiac enzymes are positive for non ST- segment elevation myocardial infarction.  He has undergone renal evaluation with creatinine 3.8 improving to 3.4.  He has been hydrated. In light of his marked ECG abnormalities, cardiac catheterization was recommended.  PROCEDURE:  The procedure was done with attempt at limited contrast. The patient was prepped and draped in usual fashion.  His right femoral artery is punctured anteriorly and a 5-French sheath was inserted.  A 2 mg Versed plus 50 mcg of fentanyl were administered.  Diagnostic catheterization was done utilizing ultimately a 5-French JR5 left diagnostic catheter and a 5-French JR4 right diagnostic catheter.  Due to potential need for CABG revascularization surgery, one injection was also done selectively into the left subclavian internal mammary artery system.  Total contrast used approximately 40 mL.  A pigtail catheter was  inserted for left ventricular pressure recording and LVAO pullback. The patient's IV nitroglycerin was titrated up to 30 mcg during the procedure, since he was significantly hypertensive.  Hemostasis was obtained by direct manual pressure.  HEMODYNAMIC DATA:  Central aortic pressure was 160/100.  Left ventricular pressure 160/13, post A-wave of 37.  ANGIOGRAPHIC DATA:  Left main coronary artery was angiographically normal.  It bifurcated into an LAD and left circumflex system.  The LAD was a large system.  There was smooth ostial proximal 30% narrowing, followed by 40% narrowing before the first diagonal septal perforating artery.  There was at least 80% stenosis after the septal perforating artery, followed by segmental narrowing of 70% diffusely. None in the proximal to proximal mid segment.  The second diagonal vessel had 80% stenosis.  There was 70% mid LAD stenosis and distal LAD stenosis.  The circumflex vessel had luminal irregularity throughout.  The vessel gave rise to a proximal marginal vessel.  There was an 80% bifurcation stenosis in this marginal vessel and the inferior branch also had additional 70% narrowing, followed by distal 90% stenosis.  There was irregularity in the AV groove circumflex with narrowing of approximately 50%.  The right coronary artery was a moderate-sized vessel that had 20-30% smooth luminal narrowing in the mid segment.  There was 90% stenosis  in a small PD1 vessel and 70% stenosis in the PLA like vessel.  Selective angiography in the left subclavian internal mammary artery revealed normal LIMA system if CABG surgery is to be performed.  IMPRESSION: 1. Significant multivessel coronary artery disease with diffuse     narrowing of the left anterior descending artery with 30-40%     proximal stenosis, 80% stenosis after the septal perforating artery     with segmental 70% stenosis beyond this, 70% mid and mid distal     stenosis with 80%  stenosis distally. 2. Luminal irregularities of circumflex coronary artery with 80%     bifurcation stenosis in the large marginal vessel with 70% stenosis     in the mid marginal inferior branch, followed by 90% stenosis and     50% stenosis in the AV groove circumflex. 3. Right coronary artery with 20-30% smooth narrowing in the mid     segment with 90% stenosis in the small PD1 branch, 70% stenosis     diffusely in the proximal segment of the PLA branch.  RECOMMENDATION:  [name] has diffuse multivessel CAD.  He will be aggressively hydrated, following his procedure.  Contrast was limited to 40 mL for the diagnostic catheterization.  Surgical consultation will be obtained for consideration of possible CABG revascularization surgery in light of his diffusely diseased LAD system which may not be optimally treated with percutaneous coronary intervention.          ______________________________ [name], M.D.     TK/MEDQ  D:  03/28/2011  T:  03/29/2011  Job:  [id number]  cc:   Cyndra Numbers, MD Garnetta Buddy, M.D.

## 2011-03-29 NOTE — Progress Notes (Signed)
ANTICOAGULATION CONSULT NOTE - Follow Up Consult  Pharmacy Consult for: Heparin Indication: Diffuse CAD awaiting CABG   Allergies  Allergen Reactions  . Shrimp (Shellfish Allergy) Shortness Of Breath  . Atorvastatin Other (See Comments)    REACTION: weakness  . Simvastatin Other (See Comments)    REACTION: abnormal liver tests    Vital Signs: Temp: 97.9 F (36.6 C) (01/12 0400) Temp src: Oral (01/12 0400) BP: 126/81 mmHg (01/12 0400) Pulse Rate: 65  (01/12 0700)  Labs:  Basename 03/29/11 0630 03/29/11 0008 03/28/11 1840 03/28/11 1632 03/28/11 1349 03/28/11 0505 03/27/11 0600 03/27/11 0136  HGB 10.2* -- -- -- -- 9.7* -- --  HCT 30.9* -- -- -- -- 29.3* -- 34.4*  PLT 290 -- -- -- -- 271 -- 277  APTT -- -- -- -- -- -- 57* --  LABPROT -- -- -- -- -- 14.4 14.7 --  INR -- -- -- -- -- 1.10 1.13 --  HEPARINUNFRC 0.19* -- 0.21* -- -- 0.32 -- --  CREATININE 3.45* -- -- -- -- 3.46* 3.70* --  CKTOTAL -- 216 -- 324* 362* -- -- --  CKMB -- 11.1* -- 16.7* 18.4* -- -- --  TROPONINI -- 2.92* -- 3.30* 5.44* -- -- --   Estimated Creatinine Clearance: 34.3 ml/min (by C-G formula based on Cr of 3.45).   Medications:  Heparin @ 1750 units/hr  Assessment: 50yom on heparin s/p cath for diffuse CAD/NSTEMI. He is awaiting CABG next week. Heparin level subtherapeutic 0.19.   No bleeding noted.  Hgb 9.7-->10.2, stable.    Goal of Therapy:  Heparin level 0.3-0.7 units/ml   Plan:  1) Increase heparin to 1950 units/hr 2) Follow up 6 hour HL.

## 2011-03-30 DIAGNOSIS — I2510 Atherosclerotic heart disease of native coronary artery without angina pectoris: Secondary | ICD-10-CM

## 2011-03-30 LAB — CBC
Platelets: 297 10*3/uL (ref 150–400)
RBC: 3.25 MIL/uL — ABNORMAL LOW (ref 4.22–5.81)
RDW: 13.6 % (ref 11.5–15.5)
WBC: 13.5 10*3/uL — ABNORMAL HIGH (ref 4.0–10.5)

## 2011-03-30 LAB — RENAL FUNCTION PANEL
Albumin: 2.9 g/dL — ABNORMAL LOW (ref 3.5–5.2)
Chloride: 105 mEq/L (ref 96–112)
GFR calc Af Amer: 22 mL/min — ABNORMAL LOW (ref >90)
GFR calc non Af Amer: 19 mL/min — ABNORMAL LOW (ref >90)
Potassium: 4.2 mEq/L (ref 3.5–5.1)

## 2011-03-30 LAB — IRON AND TIBC
Iron: 57 ug/dL (ref 42–135)
TIBC: 215 ug/dL (ref 215–435)

## 2011-03-30 LAB — CARDIAC PANEL(CRET KIN+CKTOT+MB+TROPI)
CK, MB: 19.9 ng/mL (ref 0.3–4.0)
Relative Index: 8 — ABNORMAL HIGH (ref 0.0–2.5)
Total CK: 248 U/L — ABNORMAL HIGH (ref 7–232)
Troponin I: 7.35 ng/mL (ref <0.30)

## 2011-03-30 LAB — HEPARIN LEVEL (UNFRACTIONATED): Heparin Unfractionated: 0.27 IU/mL — ABNORMAL LOW (ref 0.30–0.70)

## 2011-03-30 MED ORDER — METOPROLOL TARTRATE 50 MG PO TABS
50.0000 mg | ORAL_TABLET | Freq: Four times a day (QID) | ORAL | Status: DC
  Administered 2011-03-30 – 2011-03-31 (×4): 50 mg via ORAL
  Filled 2011-03-30 (×11): qty 1

## 2011-03-30 MED ORDER — ALPRAZOLAM 0.25 MG PO TABS: 0.2500 mg | ORAL_TABLET | ORAL | Status: DC | PRN

## 2011-03-30 MED ORDER — METOPROLOL TARTRATE 50 MG PO TABS
50.0000 mg | ORAL_TABLET | Freq: Four times a day (QID) | ORAL | Status: DC
  Filled 2011-03-30 (×4): qty 1

## 2011-03-30 MED ORDER — METOPROLOL TARTRATE 50 MG PO TABS
50.0000 mg | ORAL_TABLET | Freq: Once | ORAL | Status: AC
  Administered 2011-03-30: 50 mg via ORAL
  Filled 2011-03-30: qty 1

## 2011-03-30 NOTE — Progress Notes (Signed)
CRITICAL VALUE ALERT  Critical value received: troponin 4.32  Date of notification: 03/29/2011  Time of notification: 0015 Critical value read back:yes  Nurse who received alert:Leonda Cristo kuffour  MD notified (1st page): Herbie Baltimore  Time of first page:  0015  MD notified (2nd page):  Time of second page:  Responding MD: Herbie Baltimore  Time MD responded: 0030

## 2011-03-30 NOTE — Progress Notes (Signed)
2 Days Post-Op Procedure(s) (LRB): LEFT HEART CATHETERIZATION WITH CORONARY ANGIOGRAM (N/A)                       [address]            [place] [address]          [phone]     Subjective: Unstable angina NSR Creat remains 3.5  Objective: Vital signs in last 24 hours: Temp:  [97 F (36.1 C)-98.4 F (36.9 C)] 98 F (36.7 C) (01/13 0800) Pulse Rate:  [60-85] 82  (01/13 0900) Cardiac Rhythm:  [-] Normal sinus rhythm (01/13 0000) Resp:  [16-18] 18  (01/13 0400) BP: (99-144)/(40-87) 136/86 mmHg (01/13 0900) SpO2:  [95 %-100 %] 97 % (01/13 0900) FiO2 (%):  [2 %] 2 % (01/12 1525) Weight:  [257 lb 11.5 oz (116.9 kg)] 257 lb 11.5 oz (116.9 kg) (01/13 0500)  Hemodynamic parameters for last 24 hours:    Intake/Output from previous day: 01/12 0701 - 01/13 0700 In: 2673.9 [P.O.:900; I.V.:1773.9] Out: 3300 [Urine:3300] Intake/Output this shift: Total I/O In: 432 [I.V.:432] Out: 1400 [Urine:1400]  EXAM stable  Lab Results:  Basename 03/30/11 0630 03/29/11 0630  WBC 13.5* 16.1*  HGB 9.3* 10.2*  HCT 28.1* 30.9*  PLT 297 290   BMET:  Basename 03/30/11 0630 03/29/11 0630  NA 137 136  K 4.2 4.9  CL 105 105  CO2 22 19  GLUCOSE 121* 160*  BUN 41* 40*  CREATININE 3.46* 3.45*  CALCIUM 9.1 9.3    PT/INR:  Basename 03/28/11 0505  LABPROT 14.4  INR 1.10   ABG No results found for this basename: phart, pco2, po2, hco3, tco2, acidbasedef, o2sat   CBG (last 3)  No results found for this basename: GLUCAP:3 in the last 72 hours  Assessment/Plan: S/P Procedure(s) (LRB): LEFT HEART CATHETERIZATION WITH CORONARY ANGIOGRAM (N/A) Severe 3v CAD  Plan CABG 1-15, will ask VVS to place Diatek HD cath at same time   LOS: 3 days    VAN TRIGT III,PETER 03/30/2011

## 2011-03-30 NOTE — Progress Notes (Signed)
Subjective: Interval History: Pt developed severe SSCP requiring escalation of meds and was moved to the acute side; pain free now; he and family say CABG next week - not sure what day - sooner if more pain; pain free right now and feels fine.  Objective:  Vital signs in last 24 hours:  Temp:  [97 F (36.1 C)-98.4 F (36.9 C)] 98 F (36.7 C) (01/13 0800) Pulse Rate:  [60-92] 82  (01/13 0900) Resp:  [16-18] 18  (01/13 0400) BP: (99-187)/(40-105) 136/86 mmHg (01/13 0900) SpO2:  [89 %-100 %] 97 % (01/13 0900) FiO2 (%):  [2 %] 2 % (01/12 1525) Weight:  [116.9 kg (257 lb 11.5 oz)] 116.9 kg (257 lb 11.5 oz) (01/13 0500) Weight change: 0.2 kg (7.1 oz)  Intake/Output: I/O last 3 completed shifts: In: 4001.4 [P.O.:900; I.V.:3101.4] Out: 4725 [Urine:4725]  Intake/Output this shift:  Total I/O In: 432 [I.V.:432] Out: 1400 [Urine:1400]   INFUSIONS . sodium chloride 50 mL/hr at 03/30/11 0403  . heparin 2,250 Units/hr (03/30/11 0947)  . nitroGLYCERIN 125 mcg/min (03/30/11 1058)  . DISCONTD: eptifibatide Stopped (03/29/11 1730)  . DISCONTD: heparin 1,950 Units/hr (03/29/11 0900)  . DISCONTD: heparin 1,950 Units/hr (03/29/11 1712)  . DISCONTD: nitroGLYCERIN 125 mcg/min (03/29/11 1521)  SCHEDULED MEDS . active partnership for health of your heart book   Does not apply Once  . amLODipine  10 mg Oral Daily  . antiseptic oral rinse  15 mL Mouth Rinse BID  . aspirin  325 mg Oral Daily  . cloNIDine  0.2 mg Oral TID  . eptifibatide  180 mcg/kg Intravenous Once  . febuxostat  80 mg Oral Daily  . hydrALAZINE  10 mg Intravenous Once  . metoprolol  5 mg Intravenous Once  . metoprolol tartrate  50 mg Oral Q6H  . metoprolol tartrate  50 mg Oral Once  . nitroGLYCERIN      . omega-3 acid ethyl esters  3 g Oral Daily  . polyethylene glycol  17 g Oral Daily  . rosuvastatin  20 mg Oral q1800  . sodium bicarbonate  650 mg Oral TID  . sodium chloride  3 mL Intravenous Q12H  . terazosin  10 mg Oral  QHS  . DISCONTD: metoprolol tartrate  50 mg Oral Q12H  . DISCONTD: metoprolol tartrate  50 mg Oral TID  . DISCONTD: metoprolol tartrate  50 mg Oral Q6H   EXAM: Jovial AA male, NAD  Clear  S1S2 no S3 1/6 murmur as prev  Soft abd  No edema  No peripheral stigmata of atheroemboli Red armband on left arm and all IV's moved to right Lab Results:  Basename 03/30/11 0630 03/29/11 0630 03/28/11 0505  WBC 13.5* 16.1* 9.0  HGB 9.3* 10.2* 9.7*  HCT 28.1* 30.9* 29.3*  PLT 297 290 271   BMET  Basename 03/30/11 0630 03/29/11 0630 03/28/11 0505  NA 137 136 138  K 4.2 4.9 4.1  CL 105 105 106  CO2 22 19 21   GLUCOSE 121* 160* 118*  BUN 41* 40* 35*  CREATININE 3.46* 3.45* 3.46*  CALCIUM 9.1 9.3 9.2  PHOS 3.6 3.1 --   LFT  Basename 03/30/11 0630  PROT --  ALBUMIN 2.9*  AST --  ALT --  ALKPHOS --  BILITOT --  BILIDIR --  IBILI --   No results found for this basename: IRON, TIBC, FERRITIN   Assessment/Plan: This is a 51 year old man with PMHx of HTN, HLD, CKD stage IV (baseline creatinine 3.8; most recently  followed in [place], relocating to [facility] ) who has been transferred to [facility] for NSTEMI, s/p cardiac cath, now in need of CABG:   1. Chronic kidney disease stage IV: Renal function stable post cath with good uop (no diuretics)  Mildly depressed CO2 - added sodium bicarb po yesterday. Potassium looks fine right now (Renal function appears to be relatively stable based on his most recent records available from [place]).  With a GFR around 14 or so he is at substantial risk for AKI on CKD requiring HD with upcoming CABG.  Have discussed with Dr. Donata Clay the pros and cons of permcath placement pre-emptively in case dialysis needed peri/post operatively and the plan will be to proceed as such (he will contact vascular)  We are also saving left arm 2. Non-ST elevation myocardial infarction: s/p cath; 3v ds; for CABG next week Recurrent chest pain has responded to med Rx 3. Hypertension:  Currently on nitroglycerin drip, amlodipine, metoprolol, clonidine with fair control- monitor for further needed to de-escalate therapy. (Aliskerin has been stopped)  4. Anemia of chronic kidney disease: Hemoglobin drop to 9-10 range without overt bleeding; repeat; check stools if has any; Would avoid transfusion when possible as patient is on a transplant list at [facility]; will check iron studies - ordered and still pending; Aranesp if iron stores adequate.  5. Disposition- per cardiology, we will continue to follow pt     LOS: 3 Tyrel Lex B @TODAY @1 :26 PM

## 2011-03-30 NOTE — Progress Notes (Signed)
ANTICOAGULATION CONSULT NOTE - Follow Up Consult  Pharmacy Consult for: Heparin Indication: Diffuse CAD awaiting CABG   Allergies  Allergen Reactions  . Shrimp (Shellfish Allergy) Shortness Of Breath  . Atorvastatin Other (See Comments)    REACTION: weakness  . Simvastatin Other (See Comments)    REACTION: abnormal liver tests    Vital Signs: Temp: 98 F (36.7 C) (01/13 0800) Temp src: Oral (01/13 0400) BP: 120/62 mmHg (01/13 0800) Pulse Rate: 65  (01/13 0800)  Labs:  Basename 03/30/11 0630 03/30/11 0451 03/29/11 2231 03/29/11 1831 03/29/11 1538 03/29/11 0630 03/28/11 0505  HGB 9.3* -- -- -- -- 10.2* --  HCT 28.1* -- -- -- -- 30.9* 29.3*  PLT 297 -- -- -- -- 290 271  APTT -- -- -- -- -- -- --  LABPROT -- -- -- -- -- -- 14.4  INR -- -- -- -- -- -- 1.10  HEPARINUNFRC 0.27* -- -- -- 0.35 0.19* --  CREATININE 3.46* -- -- -- -- 3.45* 3.46*  CKTOTAL -- 248* 235* 176 -- -- --  CKMB -- 19.9* 19.3* 11.3* -- -- --  TROPONINI -- 7.35* 4.32* 2.26* -- -- --   Estimated Creatinine Clearance: 34.2 ml/min (by C-G formula based on Cr of 3.46).   Medications:  Heparin @ 2050 units/hr  Assessment: 50yom on heparin s/p cath for diffuse CAD/NSTEMI, awaiting CABG next week.  Transferred to CCU 01/12 for increasing angina pain.  Planned CABG may be scheduled sooner d/t angina episode.  Current  Heparin level subtherapeutic 0.27.   No bleeding noted.  Hgb 10.2-->9.3.  Pt also on ASA 325mg  daily.    Goal of Therapy:  Heparin level 0.3-0.7 units/ml (heparin dosing weight =105kg)   Plan:  1) Increase heparin to 2250 units/hr = 22.5 ml/hr  2) Follow up 6 hour HL.

## 2011-03-30 NOTE — Progress Notes (Signed)
ANTICOAGULATION CONSULT NOTE - Follow Up Consult  Pharmacy Consult for: Heparin  Indication: Diffuse CAD awaiting CABG   Allergies  Allergen Reactions  . Shrimp (Shellfish Allergy) Shortness Of Breath  . Atorvastatin Other (See Comments)    REACTION: weakness  . Simvastatin Other (See Comments)    REACTION: abnormal liver tests    Patient Measurements: Height: 6\' 1"  (185.4 cm) Weight: 257 lb 11.5 oz (116.9 kg) IBW/kg (Calculated) : 79.9   Vital Signs: Temp: 98.9 F (37.2 C) (01/13 1615) Temp src: Oral (01/13 1615) BP: 112/53 mmHg (01/13 1615) Pulse Rate: 68  (01/13 1615)  Labs:  Basename 03/30/11 1537 03/30/11 0630 03/30/11 0451 03/29/11 2231 03/29/11 1831 03/29/11 1538 03/29/11 0630 03/28/11 0505  HGB -- 9.3* -- -- -- -- 10.2* --  HCT -- 28.1* -- -- -- -- 30.9* 29.3*  PLT -- 297 -- -- -- -- 290 271  APTT -- -- -- -- -- -- -- --  LABPROT -- -- -- -- -- -- -- 14.4  INR -- -- -- -- -- -- -- 1.10  HEPARINUNFRC 0.29* 0.27* -- -- -- 0.35 -- --  CREATININE -- 3.46* -- -- -- -- 3.45* 3.46*  CKTOTAL -- -- 248* 235* 176 -- -- --  CKMB -- -- 19.9* 19.3* 11.3* -- -- --  TROPONINI -- -- 7.35* 4.32* 2.26* -- -- --   Estimated Creatinine Clearance: 34.2 ml/min (by C-G formula based on Cr of 3.46).   Medications:  Scheduled:    . active partnership for health of your heart book   Does not apply Once  . amLODipine  10 mg Oral Daily  . antiseptic oral rinse  15 mL Mouth Rinse BID  . aspirin  325 mg Oral Daily  . cloNIDine  0.2 mg Oral TID  . febuxostat  80 mg Oral Daily  . metoprolol  5 mg Intravenous Once  . metoprolol tartrate  50 mg Oral Q6H  . metoprolol tartrate  50 mg Oral Once  . nitroGLYCERIN      . omega-3 acid ethyl esters  3 g Oral Daily  . polyethylene glycol  17 g Oral Daily  . rosuvastatin  20 mg Oral q1800  . sodium bicarbonate  650 mg Oral TID  . sodium chloride  3 mL Intravenous Q12H  . terazosin  10 mg Oral QHS  . DISCONTD: metoprolol tartrate  50 mg  Oral Q12H  . DISCONTD: metoprolol tartrate  50 mg Oral TID  . DISCONTD: metoprolol tartrate  50 mg Oral Q6H   Medications:  Heparin @ 2250 units/hr  Assessment: 50yom on heparin s/p cath for diffuse CAD/NSTEMI, awaiting CABG next week. Heparin level 0.29, close to goal. No bleeding noted.   Goal of Therapy:  Heparin level 0.3-0.7 units/ml   Plan:  - Increase heparin infusion rate to 2350 units/hr to stay in therapeutic range - will f/u heparin level and cbc in AM    Riki Rusk 03/30/2011,6:03 PM

## 2011-03-30 NOTE — Progress Notes (Signed)
[facility] Progress Note  Subjective:  Had recurrent chest pain yesterday.  Pain free now on Increased IV NTG and heparin.  Objective:   Vital Signs in the last 24 hours: Temp:  [97 F (36.1 C)-98.4 F (36.9 C)] 98 F (36.7 C) (01/13 0800) Pulse Rate:  [60-92] 65  (01/13 0800) Resp:  [16-18] 18  (01/13 0400) BP: (99-187)/(40-105) 120/62 mmHg (01/13 0800) SpO2:  [89 %-100 %] 97 % (01/13 0800) FiO2 (%):  [2 %] 2 % (01/12 1525) Weight:  [116.9 kg (257 lb 11.5 oz)] 116.9 kg (257 lb 11.5 oz) (01/13 0500)  Intake/Output from previous day: 01/12 0701 - 01/13 0700 In: 2673.9 [P.O.:900; I.V.:1773.9] Out: 3300 [Urine:3300]  Scheduled:   . active partnership for health of your heart book   Does not apply Once  . amLODipine  10 mg Oral Daily  . antiseptic oral rinse  15 mL Mouth Rinse BID  . aspirin  325 mg Oral Daily  . cloNIDine  0.2 mg Oral TID  . eptifibatide  180 mcg/kg Intravenous Once  . febuxostat  80 mg Oral Daily  . hydrALAZINE  10 mg Intravenous Once  . metoprolol  5 mg Intravenous Once  . metoprolol tartrate  50 mg Oral TID  . nitroGLYCERIN      . omega-3 acid ethyl esters  3 g Oral Daily  . polyethylene glycol  17 g Oral Daily  . rosuvastatin  20 mg Oral q1800  . sodium bicarbonate  650 mg Oral TID  . sodium chloride  3 mL Intravenous Q12H  . terazosin  10 mg Oral QHS  . DISCONTD: metoprolol tartrate  50 mg Oral Q12H    Physical Exam:   General appearance: alert, cooperative and no distress Neck: no adenopathy, no carotid bruit, no JVD, supple, symmetrical, trachea midline and thyroid not enlarged, symmetric, no tenderness/mass/nodules Lungs: clear to auscultation bilaterally Heart: S1, S2 normal Abdomen: soft, non-tender; bowel sounds normal; no masses,  no organomegaly Extremities: extremities normal, atraumatic, no cyanosis or edema   Rate: 68  Rhythm: normal sinus rhythm  Lab Results: Results for orders placed during the  hospital encounter of 03/27/11 (from the past 24 hour(s))  HEPARIN LEVEL (UNFRACTIONATED)     Status: Normal   Collection Time   03/29/11  3:38 PM      Component Value Range   Heparin Unfractionated 0.35  0.30 - 0.70 (IU/mL)  CARDIAC PANEL(CRET KIN+CKTOT+MB+TROPI)     Status: Abnormal   Collection Time   03/29/11  6:31 PM      Component Value Range   Total CK 176  7 - 232 (U/L)   CK, MB 11.3 (*) 0.3 - 4.0 (ng/mL)   Troponin I 2.26 (*) <0.30 (ng/mL)   Relative Index 6.4 (*) 0.0 - 2.5   CARDIAC PANEL(CRET KIN+CKTOT+MB+TROPI)     Status: Abnormal   Collection Time   03/29/11 10:31 PM      Component Value Range   Total CK 235 (*) 7 - 232 (U/L)   CK, MB 19.3 (*) 0.3 - 4.0 (ng/mL)   Troponin I 4.32 (*) <0.30 (ng/mL)   Relative Index 8.2 (*) 0.0 - 2.5   CARDIAC PANEL(CRET KIN+CKTOT+MB+TROPI)     Status: Abnormal   Collection Time   03/30/11  4:51 AM      Component Value Range   Total CK 248 (*) 7 - 232 (U/L)   CK, MB 19.9 (*) 0.3 - 4.0 (ng/mL)   Troponin I  7.35 (*) <0.30 (ng/mL)   Relative Index 8.0 (*) 0.0 - 2.5   HEPARIN LEVEL (UNFRACTIONATED)     Status: Abnormal   Collection Time   03/30/11  6:30 AM      Component Value Range   Heparin Unfractionated 0.27 (*) 0.30 - 0.70 (IU/mL)  RENAL FUNCTION PANEL     Status: Abnormal   Collection Time   03/30/11  6:30 AM      Component Value Range   Sodium 137  135 - 145 (mEq/L)   Potassium 4.2  3.5 - 5.1 (mEq/L)   Chloride 105  96 - 112 (mEq/L)   CO2 22  19 - 32 (mEq/L)   Glucose, Bld 121 (*) 70 - 99 (mg/dL)   BUN 41 (*) 6 - 23 (mg/dL)   Creatinine, Ser 7.82 (*) 0.50 - 1.35 (mg/dL)   Calcium 9.1  8.4 - 95.6 (mg/dL)   Phosphorus 3.6  2.3 - 4.6 (mg/dL)   Albumin 2.9 (*) 3.5 - 5.2 (g/dL)   GFR calc non Af Amer 19 (*) >90 (mL/min)   GFR calc Af Amer 22 (*) >90 (mL/min)    Basename 03/30/11 0630 03/29/11 0630  NA 137 136  K 4.2 4.9  CL 105 105  CO2 22 19  GLUCOSE 121* 160*  BUN 41* 40*  CREATININE 3.46* 3.45*    Basename 03/30/11  0451 03/29/11 2231  TROPONINI 7.35* 4.32*   Hepatic Function Panel  Basename 03/30/11 0630  PROT --  ALBUMIN 2.9*  AST --  ALT --  ALKPHOS --  BILITOT --  BILIDIR --  IBILI --    Basename 03/28/11 0505  INR 1.10   Lipid Panel     Component Value Date/Time   CHOL 206* 06/23/2008 0834   TRIG 91.0 06/23/2008 0834   HDL 56.60 06/23/2008 0834   CHOLHDL 4 06/23/2008 0834   VLDL 18.2 06/23/2008 0834     Imaging:  No results found.  Cardiac Studies:     Assessment/Plan:   Principal Problem:  *NSTEMI (non-ST elevated myocardial infarction) pk Troponin 22.9 Active Problems:  HYPERLIPIDEMIA  HYPERTENSION  RENAL INSUFFICIENCY, Cr 3.7  CEREBROVASCULAR ACCIDENT, HX OF CVA X 4  Gout  CAD (coronary artery disease), 3 vessel significant disease.    Recurrent chest pain yesterday associated with hyperacute T waves. T wave inversion now present similar to pre chest pain ECG. On IV NTG at 125 micrgrams. Will increase Lopressor to 50 mg every 6 hours, and if tolerates can change to 100 mg bid. CABG this week.  Pt states that his baseline Cr is in the 3 range; he has been on the [facility] transplant list since October.   Lennette Bihari, MD, [facility] 03/30/2011, 8:16 AM

## 2011-03-31 ENCOUNTER — Inpatient Hospital Stay (HOSPITAL_COMMUNITY): Payer: Medicare Other

## 2011-03-31 DIAGNOSIS — I2510 Atherosclerotic heart disease of native coronary artery without angina pectoris: Secondary | ICD-10-CM

## 2011-03-31 LAB — CBC
MCH: 28.8 pg (ref 26.0–34.0)
Platelets: 300 10*3/uL (ref 150–400)
RBC: 3.26 MIL/uL — ABNORMAL LOW (ref 4.22–5.81)
WBC: 11.9 10*3/uL — ABNORMAL HIGH (ref 4.0–10.5)

## 2011-03-31 LAB — HEPARIN LEVEL (UNFRACTIONATED): Heparin Unfractionated: 0.32 IU/mL (ref 0.30–0.70)

## 2011-03-31 LAB — TYPE AND SCREEN
ABO/RH(D): B POS
Antibody Screen: NEGATIVE
Unit division: 0
Unit division: 0
Unit division: 0
Unit division: 0
Unit division: 0
Unit division: 0

## 2011-03-31 LAB — RENAL FUNCTION PANEL
CO2: 21 mEq/L (ref 19–32)
Calcium: 9 mg/dL (ref 8.4–10.5)
Chloride: 107 mEq/L (ref 96–112)
GFR calc Af Amer: 21 mL/min — ABNORMAL LOW (ref >90)
GFR calc non Af Amer: 18 mL/min — ABNORMAL LOW (ref >90)
Potassium: 4.3 mEq/L (ref 3.5–5.1)
Sodium: 139 mEq/L (ref 135–145)

## 2011-03-31 LAB — ABO/RH: ABO/RH(D): B POS

## 2011-03-31 LAB — PREPARE RBC (CROSSMATCH)

## 2011-03-31 MED ORDER — PHENYLEPHRINE HCL 10 MG/ML IJ SOLN
30.0000 ug/min | INTRAVENOUS | Status: DC
  Filled 2011-03-31: qty 2

## 2011-03-31 MED ORDER — POTASSIUM CHLORIDE 2 MEQ/ML IV SOLN
80.0000 meq | INTRAVENOUS | Status: DC
  Filled 2011-03-31: qty 40

## 2011-03-31 MED ORDER — CHLORHEXIDINE GLUCONATE 4 % EX LIQD
60.0000 mL | Freq: Once | CUTANEOUS | Status: AC
  Administered 2011-03-31: 4 via TOPICAL

## 2011-03-31 MED ORDER — CHLORHEXIDINE GLUCONATE 4 % EX LIQD
60.0000 mL | Freq: Once | CUTANEOUS | Status: AC
  Administered 2011-04-01: 4 via TOPICAL

## 2011-03-31 MED ORDER — MAGNESIUM SULFATE 50 % IJ SOLN
40.0000 meq | INTRAMUSCULAR | Status: DC
  Filled 2011-03-31: qty 10

## 2011-03-31 MED ORDER — METOPROLOL TARTRATE 12.5 MG HALF TABLET
12.5000 mg | ORAL_TABLET | Freq: Once | ORAL | Status: AC
  Administered 2011-04-01: 12.5 mg via ORAL
  Filled 2011-03-31: qty 1

## 2011-03-31 MED ORDER — SODIUM CHLORIDE 0.9 % IV SOLN
INTRAVENOUS | Status: DC
  Filled 2011-03-31: qty 1

## 2011-03-31 MED ORDER — VERAPAMIL HCL 2.5 MG/ML IV SOLN
INTRAVENOUS | Status: AC
  Administered 2011-04-01: 10:00:00
  Filled 2011-03-31 (×2): qty 2.5

## 2011-03-31 MED ORDER — BISACODYL 5 MG PO TBEC
5.0000 mg | DELAYED_RELEASE_TABLET | Freq: Once | ORAL | Status: AC
  Administered 2011-03-31: 5 mg via ORAL
  Filled 2011-03-31: qty 1

## 2011-03-31 MED ORDER — NITROGLYCERIN IN D5W 200-5 MCG/ML-% IV SOLN
2.0000 ug/min | INTRAVENOUS | Status: DC
  Filled 2011-03-31: qty 250

## 2011-03-31 MED ORDER — VANCOMYCIN HCL 1000 MG IV SOLR
1500.0000 mg | INTRAVENOUS | Status: AC
  Administered 2011-04-01: 1500 mg via INTRAVENOUS
  Filled 2011-03-31: qty 1500

## 2011-03-31 MED ORDER — DEXTROSE 5 % IV SOLN
1.5000 g | INTRAVENOUS | Status: AC
  Administered 2011-04-01: .75 g via INTRAVENOUS
  Administered 2011-04-01: 1.5 g via INTRAVENOUS
  Filled 2011-03-31: qty 1.5

## 2011-03-31 MED ORDER — CHLORHEXIDINE GLUCONATE 4 % EX LIQD: 60.0000 mL | Freq: Once | CUTANEOUS | Status: DC

## 2011-03-31 MED ORDER — EPINEPHRINE HCL 1 MG/ML IJ SOLN
0.5000 ug/min | INTRAMUSCULAR | Status: DC
  Filled 2011-03-31: qty 4

## 2011-03-31 MED ORDER — DARBEPOETIN ALFA-POLYSORBATE 100 MCG/0.5ML IJ SOLN
100.0000 ug | INTRAMUSCULAR | Status: DC
  Administered 2011-03-31: 100 ug via SUBCUTANEOUS
  Filled 2011-03-31: qty 0.5

## 2011-03-31 MED ORDER — SODIUM CHLORIDE 0.9 % IV SOLN
0.1000 ug/kg/h | INTRAVENOUS | Status: AC
  Administered 2011-04-01: .3 ug/kg/h via INTRAVENOUS
  Filled 2011-03-31: qty 4

## 2011-03-31 MED ORDER — CHLORHEXIDINE GLUCONATE 4 % EX LIQD
60.0000 mL | Freq: Once | CUTANEOUS | Status: DC
  Filled 2011-03-31: qty 60
  Filled 2011-03-31: qty 15
  Filled 2011-03-31: qty 60

## 2011-03-31 MED ORDER — TEMAZEPAM 15 MG PO CAPS: 15.0000 mg | ORAL_CAPSULE | Freq: Once | ORAL | Status: AC | PRN

## 2011-03-31 MED ORDER — SODIUM CHLORIDE 0.9 % IV SOLN
INTRAVENOUS | Status: AC
  Administered 2011-04-01: 70 mL/h via INTRAVENOUS
  Filled 2011-03-31: qty 40

## 2011-03-31 MED ORDER — DEXTROSE 5 % IV SOLN
750.0000 mg | INTRAVENOUS | Status: DC
  Filled 2011-03-31: qty 750

## 2011-03-31 MED ORDER — DOPAMINE-DEXTROSE 3.2-5 MG/ML-% IV SOLN
2.0000 ug/kg/min | INTRAVENOUS | Status: DC
  Filled 2011-03-31: qty 250

## 2011-03-31 NOTE — Progress Notes (Signed)
Patient ID: [name], male   DOB: [birth year], 51 y.o.   MRN: [id number]  S: Reports to be feeling well, denies any chest pain overnight. Cardiology notes reviewed.  O:BP 118/65  Pulse 65  Temp(Src) 98.8 F (37.1 C) (Oral)  Resp 16  Ht 6\' 1"  (1.854 m)  Wt 117.6 kg (259 lb 4.2 oz)  BMI 34.21 kg/m2  SpO2 98%  Intake/Output Summary (Last 24 hours) at 03/31/11 1149 Last data filed at 03/31/11 0800  Gross per 24 hour  Intake 2350.9 ml  Output   3400 ml  Net -1049.1 ml   Weight change: 0.7 kg (1 lb 8.7 oz) Gen: Comfortably sleeping in bed, arouses to conversation and is oriented to time person and place. CVS: Pulse regular in rate and rhythm, heart sounds S1 and S2 are distant. Resp: Clear to auscultation bilaterally, no rales, retractions or rhonchi. Abd: Soft, obese, nontender, bowel sounds are normal. Ext: No palpable edema over the lower extremities.      Marland Kitchen active partnership for health of your heart book   Does not apply Once  . amLODipine  10 mg Oral Daily  . antiseptic oral rinse  15 mL Mouth Rinse BID  . aspirin  325 mg Oral Daily  . bisacodyl  5 mg Oral Once  . chlorhexidine  60 mL Topical Once  . chlorhexidine  60 mL Topical Once  . chlorhexidine  60 mL Topical Once  . cloNIDine  0.2 mg Oral TID  . febuxostat  80 mg Oral Daily  . metoprolol tartrate  50 mg Oral Q6H  . metoprolol tartrate  12.5 mg Oral Once  . omega-3 acid ethyl esters  3 g Oral Daily  . polyethylene glycol  17 g Oral Daily  . rosuvastatin  20 mg Oral q1800  . sodium bicarbonate  650 mg Oral TID  . sodium chloride  3 mL Intravenous Q12H  . terazosin  10 mg Oral QHS  . DISCONTD: chlorhexidine  60 mL Topical Once  . DISCONTD: chlorhexidine  60 mL Topical Once   No results found. BMET  Lab 03/31/11 0500 03/30/11 0630 03/29/11 0630 03/28/11 0505 03/27/11 0600 03/27/11 0136  NA 139 137 136 138 139 140  K 4.3 4.2 4.9 4.1 4.0 3.6  CL 107 105 105 106 103 102  CO2 21 22 19 21 22 24   GLUCOSE  123* 121* 160* 118* 144* 170*  BUN 42* 41* 40* 35* 37* 37*  CREATININE 3.63* 3.46* 3.45* 3.46* 3.70* 3.80*  ALB -- -- -- -- -- --  CALCIUM 9.0 9.1 9.3 9.2 9.2 9.7  PHOS 3.3 3.6 3.1 -- -- --   CBC  Lab 03/31/11 0500 03/30/11 0630 03/29/11 0630 03/28/11 0505 03/27/11 0136  WBC 11.9* 13.5* 16.1* 9.0 --  NEUTROABS -- -- -- -- 7.6  HGB 9.4* 9.3* 10.2* 9.7* --  HCT 28.2* 28.1* 30.9* 29.3* --  MCV 86.5 86.5 87.0 87.2 --  PLT 300 297 290 271 --     Assessment/Plan:   1. Chronic kidney disease stage IV: Renal function stable post cath with good uop (no diuretics) and without any critical electrolyte abnormalities to indicate the need for RRT. With a GFR around 14 or so he is at substantial risk for AKI on CKD requiring HD with upcoming CABG. The plan is noted to place a tunneled dialysis catheter at the time of CABG in order to undertake dialysis if acutely needed rather than resort to urgent placement of a dialysis catheter.  The patient is aware of this plan. 3. Hypertension: Currently on nitroglycerin drip, amlodipine, metoprolol, clonidine with fair control- monitor for further needed to de-escalate therapy. (Aliskerin has been stopped). Given the hemodynamic nature of his CABG,  plan to hold RAS blocking agents and use alternative agent for blood pressure control when warranted.  4. Anemia of chronic kidney disease: Hemoglobin drop to 9-10 range without overt bleeding; repeat; check stools if has any; Would avoid transfusion when possible as patient is on a transplant list at [facility]; iron saturation is adequate at 27%, we'll start Aranesp 100 mcg every week-first dose today  5. Disposition- per cardiology/CTS, we will continue to follow pt   Charleen Madera K.

## 2011-03-31 NOTE — Progress Notes (Signed)
Subjective: No chest pain.  Objective: Vital signs in last 24 hours: Temp:  [98 F (36.7 C)-99 F (37.2 C)] 99 F (37.2 C) (01/13 2330) Pulse Rate:  [60-82] 62  (01/14 0600) Resp:  [16-20] 16  (01/14 0500) BP: (104-136)/(53-86) 115/65 mmHg (01/14 0600) SpO2:  [95 %-99 %] 99 % (01/14 0600) Weight:  [117.6 kg (259 lb 4.2 oz)] 117.6 kg (259 lb 4.2 oz) (01/14 0500) Weight change: 0.7 kg (1 lb 8.7 oz) Last BM Date: 03/29/11 Intake/Output from previous day: -597 01/13 0701 - 01/14 0700 In: 2849.8 [I.V.:2849.8] Out: 3800 [Urine:3800] Intake/Output this shift: Total I/O In: 1211.4 [I.V.:1211.4] Out: 2100 [Urine:2100]  PE: General:A&O X 3, pleasant affect Heart:S1S2 RRR no murmur, gallup, rub Lungs:clear, without rales, rhonchi, or wheezes Abd:+BS, Soft non-tender Ext: no edema, +pedal pulses  EKG: deep twaves, laterally  Otherwise no significant change.  Lab Results:  Basename 03/31/11 0500 03/30/11 0630  WBC 11.9* 13.5*  HGB 9.4* 9.3*  HCT 28.2* 28.1*  PLT 300 297   BMET  Basename 03/30/11 0630 03/29/11 0630  NA 137 136  K 4.2 4.9  CL 105 105  CO2 22 19  GLUCOSE 121* 160*  BUN 41* 40*  CREATININE 3.46* 3.45*  CALCIUM 9.1 9.3    Basename 03/30/11 0451 03/29/11 2231  TROPONINI 7.35* 4.32*    Lab Results  Component Value Date   CHOL 206* 06/23/2008   HDL 56.60 06/23/2008   LDLDIRECT 129.4 06/23/2008   TRIG 91.0 06/23/2008   CHOLHDL 4 06/23/2008   No results found for this basename: HGBA1C     Lab Results  Component Value Date   TSH 0.506 03/27/2011    Hepatic Function Panel  Basename 03/30/11 0630  PROT --  ALBUMIN 2.9*  AST --  ALT --  ALKPHOS --  BILITOT --  BILIDIR --  IBILI --   No results found for this basename: CHOL in the last 72 hours No results found for this basename: PROTIME in the last 72 hours    EKG: Orders placed during the hospital encounter of 03/27/11  . EKG 12-LEAD  . EKG 12-LEAD  . EKG 12-LEAD  . EKG 12-LEAD  . EKG 12-LEAD   . EKG 12-LEAD  . EKG 12-LEAD  . ED EKG  . ED EKG  . EKG 12-LEAD  . EKG 12-LEAD  . EKG 12-LEAD  . EKG 12-LEAD  . EKG 12-LEAD    Studies/Results: No results found.  Medications: I have reviewed the patient's current medications.    Marland Kitchen active partnership for health of your heart book   Does not apply Once  . amLODipine  10 mg Oral Daily  . antiseptic oral rinse  15 mL Mouth Rinse BID  . aspirin  325 mg Oral Daily  . cloNIDine  0.2 mg Oral TID  . febuxostat  80 mg Oral Daily  . metoprolol tartrate  50 mg Oral Q6H  . metoprolol tartrate  50 mg Oral Once  . omega-3 acid ethyl esters  3 g Oral Daily  . polyethylene glycol  17 g Oral Daily  . rosuvastatin  20 mg Oral q1800  . sodium bicarbonate  650 mg Oral TID  . sodium chloride  3 mL Intravenous Q12H  . terazosin  10 mg Oral QHS  . DISCONTD: metoprolol tartrate  50 mg Oral TID  . DISCONTD: metoprolol tartrate  50 mg Oral Q6H   Assessment/Plan: Patient Active Problem List  Diagnoses  . HYPERLIPIDEMIA  . ANEMIA-NOS  .  DEPRESSION  . HYPERTENSION  . PERIPHERAL VASCULAR DISEASE  . HEMORRHOIDS  . ALLERGIC RHINITIS  . ACHALASIA  . GERD  . RENAL INSUFFICIENCY, Cr 3.7  . DEGENERATIVE JOINT DISEASE, SHOULDER  . HEADACHE  . CEREBROVASCULAR ACCIDENT, HX OF CVA X 4  . RENAL CALCULUS, HX OF  . NSTEMI (non-ST elevated myocardial infarction) pk Troponin 22.9  . Gout  . CAD (coronary artery disease), 3 vessel significant disease.    PLAN:Recurrent NSTEMI, since admit.  Plan for CABG 04/01/11.  Cr. Stable at 3.46,   Anemia stable though slow drift down, to monitor.  NTG drip @ 125 mcg.  IV Heparin continues.       LOS: 4 days   INGOLD,LAURA R 03/31/2011, 6:31 AM      Patient seen and examined. Agree with assessment and plan. No further CP on 125 micrograms of IV NTG and 50 mg every 6 hrs Lopressor.  Persistent anterolateral T wave inversion without further changes.  CABG in am.   Lennette Bihari, MD,  [facility] 03/31/2011 8:52 AM

## 2011-03-31 NOTE — Progress Notes (Signed)
Angina Stable Creat 3.6 Plan CABG in am with preop Diatek catheter

## 2011-03-31 NOTE — Progress Notes (Signed)
ANTICOAGULATION CONSULT NOTE - Follow Up Consult  Pharmacy Consult for Heparin Indication:  NSTEMI/diffuse CAD, for CABG 1/15  Allergies  Allergen Reactions  . Shrimp (Shellfish Allergy) Shortness Of Breath  . Atorvastatin Other (See Comments)    REACTION: weakness  . Simvastatin Other (See Comments)    REACTION: abnormal liver tests    Patient Measurements: Height: 6\' 1"  (185.4 cm) Weight: 259 lb 4.2 oz (117.6 kg) IBW/kg (Calculated) : 79.9  Adjusted Body Weight: 105 kg  Vital Signs: Temp: 98.8 F (37.1 C) (01/14 0800) Temp src: Oral (01/14 0800) BP: 115/65 mmHg (01/14 0600) Pulse Rate: 62  (01/14 0600)  Labs:  Basename 03/31/11 0500 03/30/11 1537 03/30/11 0630 03/30/11 0451 03/29/11 2231 03/29/11 1831 03/29/11 0630  HGB 9.4* -- 9.3* -- -- -- --  HCT 28.2* -- 28.1* -- -- -- 30.9*  PLT 300 -- 297 -- -- -- 290  APTT -- -- -- -- -- -- --  LABPROT -- -- -- -- -- -- --  INR -- -- -- -- -- -- --  HEPARINUNFRC 0.32 0.29* 0.27* -- -- -- --  CREATININE 3.63* -- 3.46* -- -- -- 3.45*  CKTOTAL -- -- -- 248* 235* 176 --  CKMB -- -- -- 19.9* 19.3* 11.3* --  TROPONINI -- -- -- 7.35* 4.32* 2.26* --   Estimated Creatinine Clearance: 32.7 ml/min (by C-G formula based on Cr of 3.63).  Assessment:   Heparin level is low therapeutic.  CBC low stable.  Goal of Therapy:  Heparin level 0.3-0.7 units/ml   Plan:    Continue Heparin drip at 2350 units/hr.   Heparin level in AM unless cancelled by surgeon.   (has daily CBC & bmet as well)   Expect drip to stop on call to OR 1/15.  Dennie Fetters Pager: [phone] 03/31/2011,10:48 AM

## 2011-03-31 NOTE — Progress Notes (Signed)
   CARE MANAGEMENT NOTE 03/31/2011  Patient:  [name], [name]   Account Number:  [id number]  Date Initiated:  03/28/2011  Documentation initiated by:  GRAVES-BIGELOW,Ranette Luckadoo  Subjective/Objective Assessment:   Pt in with cp. Plan for cath today. CM unable to speak to pt due to he is in procedure at time of visit.     Action/Plan:   CM will stop back by to see if any medicaiton needs.   Anticipated DC Date:  03/29/2011   Anticipated DC Plan:  HOME/SELF CARE      DC Planning Services  CM consult      Choice offered to / List presented to:             Status of service:  In process, will continue to follow Medicare Important Message given?   (If response is "NO", the following Medicare IM given date fields will be blank) Date Medicare IM given:   Date Additional Medicare IM given:    Discharge Disposition:    Per UR Regulation:    Comments:  contact person:  [name] [phone].   03-31-11 1440 Tomi Bamberger, [place] [phone] CM did speak to pt  and his mother. Pt planned for CABG 04-01-11 at 0620. Pt states he was visiting from [place]. He states his mom will not be able to care for him post d/c and he has a sister [name] [phone] and she will be out of town after CABG. Pt has planned on Rehab if needed post d/c. CM did place a call to sister and left vm to clarify d/c plans with her.  03-28-11 1447 Tomi Bamberger, RN,BSN [phone] CM went to speak to pt and he was resting. His mom was in the room and stated that the pt was visiting from [place]. Per pt has only medicare A&B no part D that covers rx drug coverage. At d/c pt will need to be on all generic meds if possible. Per MD notes due to diffuse  CAD will consult with TCTS for possible CABG. CM will f/u on Monday.   03-28-11 [address], [place] [phone] CM will continue to moniotr for d/c disposition and medication needs.

## 2011-04-01 ENCOUNTER — Inpatient Hospital Stay (HOSPITAL_COMMUNITY): Payer: Medicare Other

## 2011-04-01 ENCOUNTER — Encounter (HOSPITAL_COMMUNITY): Payer: Self-pay | Admitting: Anesthesiology

## 2011-04-01 ENCOUNTER — Encounter (HOSPITAL_COMMUNITY): Payer: Self-pay | Admitting: Certified Registered"

## 2011-04-01 ENCOUNTER — Inpatient Hospital Stay (HOSPITAL_COMMUNITY): Payer: Medicare Other | Admitting: Anesthesiology

## 2011-04-01 ENCOUNTER — Encounter (HOSPITAL_COMMUNITY)
Admission: EM | Disposition: A | Discharge status: Home / Self Care | Payer: Self-pay | Source: Home / Self Care | Attending: Cardiothoracic Surgery

## 2011-04-01 DIAGNOSIS — N186 End stage renal disease: Secondary | ICD-10-CM

## 2011-04-01 DIAGNOSIS — I2510 Atherosclerotic heart disease of native coronary artery without angina pectoris: Secondary | ICD-10-CM

## 2011-04-01 HISTORY — PX: INSERTION OF DIALYSIS CATHETER: SHX1324

## 2011-04-01 HISTORY — PX: CORONARY ARTERY BYPASS GRAFT: SHX141

## 2011-04-01 LAB — CBC
HCT: 28.1 % — ABNORMAL LOW (ref 39.0–52.0)
HCT: 29.8 % — ABNORMAL LOW (ref 39.0–52.0)
Hemoglobin: 9.3 g/dL — ABNORMAL LOW (ref 13.0–17.0)
Hemoglobin: 10.1 g/dL — ABNORMAL LOW (ref 13.0–17.0)
MCH: 28.7 pg (ref 26.0–34.0)
MCH: 29.4 pg (ref 26.0–34.0)
MCH: 29 pg (ref 26.0–34.0)
MCHC: 33.1 g/dL (ref 30.0–36.0)
MCHC: 34.3 g/dL (ref 30.0–36.0)
MCHC: 33.9 g/dL (ref 30.0–36.0)
MCV: 86.7 fL (ref 78.0–100.0)
MCV: 85.6 fL (ref 78.0–100.0)
MCV: 85.6 fL (ref 78.0–100.0)
Platelets: 299 10*3/uL (ref 150–400)
Platelets: 184 10*3/uL (ref 150–400)
Platelets: 249 10*3/uL (ref 150–400)
RBC: 3.24 MIL/uL — ABNORMAL LOW (ref 4.22–5.81)
RBC: 3.13 MIL/uL — ABNORMAL LOW (ref 4.22–5.81)
RBC: 3.48 MIL/uL — ABNORMAL LOW (ref 4.22–5.81)
RDW: 13.8 % (ref 11.5–15.5)
RDW: 14.1 % (ref 11.5–15.5)
WBC: 11.6 10*3/uL — ABNORMAL HIGH (ref 4.0–10.5)
WBC: 18.2 10*3/uL — ABNORMAL HIGH (ref 4.0–10.5)

## 2011-04-01 LAB — PROTIME-INR: Prothrombin Time: 17.6 seconds — ABNORMAL HIGH (ref 11.6–15.2)

## 2011-04-01 LAB — POCT I-STAT 4, (NA,K, GLUC, HGB,HCT)
Glucose, Bld: 138 mg/dL — ABNORMAL HIGH (ref 70–99)
Glucose, Bld: 117 mg/dL — ABNORMAL HIGH (ref 70–99)
Glucose, Bld: 158 mg/dL — ABNORMAL HIGH (ref 70–99)
HCT: 27 % — ABNORMAL LOW (ref 39.0–52.0)
HCT: 25 % — ABNORMAL LOW (ref 39.0–52.0)
HCT: 27 % — ABNORMAL LOW (ref 39.0–52.0)
HCT: 30 % — ABNORMAL LOW (ref 39.0–52.0)
Hemoglobin: 8.5 g/dL — ABNORMAL LOW (ref 13.0–17.0)
Hemoglobin: 8.5 g/dL — ABNORMAL LOW (ref 13.0–17.0)
Hemoglobin: 10.2 g/dL — ABNORMAL LOW (ref 13.0–17.0)
Potassium: 4.5 mEq/L (ref 3.5–5.1)
Potassium: 5.2 mEq/L — ABNORMAL HIGH (ref 3.5–5.1)
Sodium: 135 mEq/L (ref 135–145)
Sodium: 139 mEq/L (ref 135–145)

## 2011-04-01 LAB — POCT I-STAT 3, ART BLOOD GAS (G3+)
Acid-base deficit: 5 mmol/L — ABNORMAL HIGH (ref 0.0–2.0)
Acid-base deficit: 5 mmol/L — ABNORMAL HIGH (ref 0.0–2.0)
Bicarbonate: 21.4 mEq/L (ref 20.0–24.0)
Bicarbonate: 20 mEq/L (ref 20.0–24.0)
Bicarbonate: 21.1 mEq/L (ref 20.0–24.0)
Bicarbonate: 19.8 mEq/L — ABNORMAL LOW (ref 20.0–24.0)
O2 Saturation: 91 %
Patient temperature: 35
Patient temperature: 36.8
TCO2: 23 mmol/L (ref 0–100)
TCO2: 23 mmol/L (ref 0–100)
TCO2: 21 mmol/L (ref 0–100)
pCO2 arterial: 43.5 mmHg (ref 35.0–45.0)
pCO2 arterial: 37.1 mmHg (ref 35.0–45.0)
pCO2 arterial: 38.5 mmHg (ref 35.0–45.0)
pH, Arterial: 7.299 — ABNORMAL LOW (ref 7.350–7.450)
pH, Arterial: 7.34 — ABNORMAL LOW (ref 7.350–7.450)
pH, Arterial: 7.351 (ref 7.350–7.450)
pH, Arterial: 7.343 — ABNORMAL LOW (ref 7.350–7.450)
pO2, Arterial: 140 mmHg — ABNORMAL HIGH (ref 80.0–100.0)
pO2, Arterial: 74 mmHg — ABNORMAL LOW (ref 80.0–100.0)
pO2, Arterial: 60 mmHg — ABNORMAL LOW (ref 80.0–100.0)
pO2, Arterial: 86 mmHg (ref 80.0–100.0)

## 2011-04-01 LAB — CREATININE, SERUM
Creatinine, Ser: 3.11 mg/dL — ABNORMAL HIGH (ref 0.50–1.35)
GFR calc Af Amer: 25 mL/min — ABNORMAL LOW (ref >90)
GFR calc non Af Amer: 22 mL/min — ABNORMAL LOW (ref >90)

## 2011-04-01 LAB — POCT I-STAT, CHEM 8
BUN: 33 mg/dL — ABNORMAL HIGH (ref 6–23)
Calcium, Ion: 1.15 mmol/L (ref 1.12–1.32)
Chloride: 110 mEq/L (ref 96–112)
Creatinine, Ser: 3.2 mg/dL — ABNORMAL HIGH (ref 0.50–1.35)
TCO2: 20 mmol/L (ref 0–100)

## 2011-04-01 LAB — GLUCOSE, CAPILLARY
Glucose-Capillary: 155 mg/dL — ABNORMAL HIGH (ref 70–99)
Glucose-Capillary: 144 mg/dL — ABNORMAL HIGH (ref 70–99)
Glucose-Capillary: 172 mg/dL — ABNORMAL HIGH (ref 70–99)

## 2011-04-01 LAB — RENAL FUNCTION PANEL
Albumin: 2.8 g/dL — ABNORMAL LOW (ref 3.5–5.2)
BUN: 40 mg/dL — ABNORMAL HIGH (ref 6–23)
Chloride: 104 mEq/L (ref 96–112)
GFR calc Af Amer: 23 mL/min — ABNORMAL LOW (ref >90)
Phosphorus: 4.1 mg/dL (ref 2.3–4.6)
Potassium: 4.2 mEq/L (ref 3.5–5.1)
Sodium: 137 mEq/L (ref 135–145)

## 2011-04-01 LAB — PREPARE PLATELET PHERESIS

## 2011-04-01 LAB — PREPARE FRESH FROZEN PLASMA: Unit division: 0

## 2011-04-01 LAB — MAGNESIUM: Magnesium: 2.1 mg/dL (ref 1.5–2.5)

## 2011-04-01 LAB — HEMOGLOBIN A1C
Hgb A1c MFr Bld: 5.8 % — ABNORMAL HIGH (ref <5.7)
Mean Plasma Glucose: 120 mg/dL — ABNORMAL HIGH (ref <117)

## 2011-04-01 LAB — HEMOGLOBIN AND HEMATOCRIT, BLOOD
HCT: 25 % — ABNORMAL LOW (ref 39.0–52.0)
Hemoglobin: 8.5 g/dL — ABNORMAL LOW (ref 13.0–17.0)

## 2011-04-01 LAB — HEPARIN LEVEL (UNFRACTIONATED): Heparin Unfractionated: 0.38 IU/mL (ref 0.30–0.70)

## 2011-04-01 LAB — PLATELET COUNT: Platelets: 200 10*3/uL (ref 150–400)

## 2011-04-01 SURGERY — CORONARY ARTERY BYPASS GRAFTING (CABG)
Anesthesia: General | Site: Neck | Wound class: Clean

## 2011-04-01 MED ORDER — PROTAMINE SULFATE 10 MG/ML IV SOLN
INTRAVENOUS | Status: DC | PRN
  Administered 2011-04-01: 450 mg via INTRAVENOUS

## 2011-04-01 MED ORDER — ASPIRIN 81 MG PO CHEW: 324.0000 mg | CHEWABLE_TABLET | Freq: Every day | ORAL | Status: DC

## 2011-04-01 MED ORDER — FAMOTIDINE IN NACL 20-0.9 MG/50ML-% IV SOLN
20.0000 mg | Freq: Two times a day (BID) | INTRAVENOUS | Status: AC
  Administered 2011-04-01: 20 mg via INTRAVENOUS

## 2011-04-01 MED ORDER — VECURONIUM BROMIDE 10 MG IV SOLR
INTRAVENOUS | Status: DC | PRN
  Administered 2011-04-01: 2 mg via INTRAVENOUS
  Administered 2011-04-01: 4 mg via INTRAVENOUS

## 2011-04-01 MED ORDER — METOPROLOL TARTRATE 12.5 MG HALF TABLET
12.5000 mg | ORAL_TABLET | Freq: Two times a day (BID) | ORAL | Status: DC
  Filled 2011-04-01 (×3): qty 1

## 2011-04-01 MED ORDER — DOPAMINE-DEXTROSE 3.2-5 MG/ML-% IV SOLN
INTRAVENOUS | Status: DC | PRN
  Administered 2011-04-01: 2 ug/kg/min via INTRAVENOUS

## 2011-04-01 MED ORDER — SODIUM CHLORIDE 0.9 % IV SOLN
INTRAVENOUS | Status: DC | PRN
  Administered 2011-04-01: 10:00:00 via INTRAVENOUS

## 2011-04-01 MED ORDER — SODIUM CHLORIDE 0.9 % IV SOLN: INTRAVENOUS | Status: DC

## 2011-04-01 MED ORDER — SODIUM CHLORIDE 0.9 % IV SOLN
100.0000 [IU] | INTRAVENOUS | Status: DC | PRN
  Administered 2011-04-01: 2.3 [IU]/h via INTRAVENOUS

## 2011-04-01 MED ORDER — MORPHINE SULFATE 2 MG/ML IJ SOLN
1.0000 mg | INTRAMUSCULAR | Status: AC | PRN
  Filled 2011-04-01: qty 1

## 2011-04-01 MED ORDER — ALBUTEROL SULFATE (5 MG/ML) 0.5% IN NEBU
INHALATION_SOLUTION | RESPIRATORY_TRACT | Status: AC
  Administered 2011-04-01: 2.5 mg via RESPIRATORY_TRACT
  Filled 2011-04-01: qty 0.5

## 2011-04-01 MED ORDER — SODIUM BICARBONATE 4.2 % IV SOLN
INTRAVENOUS | Status: DC | PRN
  Administered 2011-04-01: 50 meq via INTRAVENOUS

## 2011-04-01 MED ORDER — MIDAZOLAM HCL 2 MG/2ML IJ SOLN
2.0000 mg | INTRAMUSCULAR | Status: DC | PRN
  Administered 2011-04-01 – 2011-04-02 (×4): 2 mg via INTRAVENOUS
  Filled 2011-04-01 (×4): qty 2

## 2011-04-01 MED ORDER — BISACODYL 5 MG PO TBEC
10.0000 mg | DELAYED_RELEASE_TABLET | Freq: Every day | ORAL | Status: DC
  Administered 2011-04-02 – 2011-04-05 (×4): 10 mg via ORAL
  Filled 2011-04-01 (×5): qty 2

## 2011-04-01 MED ORDER — ACETAMINOPHEN 500 MG PO TABS
1000.0000 mg | ORAL_TABLET | Freq: Four times a day (QID) | ORAL | Status: DC
  Administered 2011-04-02 – 2011-04-05 (×12): 1000 mg via ORAL
  Filled 2011-04-01 (×17): qty 2

## 2011-04-01 MED ORDER — LACTATED RINGERS IV SOLN: 500.0000 mL | Freq: Once | INTRAVENOUS | Status: AC | PRN

## 2011-04-01 MED ORDER — HEMOSTATIC AGENTS (NO CHARGE) OPTIME
TOPICAL | Status: DC | PRN
  Administered 2011-04-01: 1 via TOPICAL

## 2011-04-01 MED ORDER — ALBUMIN HUMAN 5 % IV SOLN
INTRAVENOUS | Status: DC | PRN
  Administered 2011-04-01: 14:00:00 via INTRAVENOUS

## 2011-04-01 MED ORDER — INSULIN ASPART 100 UNIT/ML ~~LOC~~ SOLN
0.0000 [IU] | SUBCUTANEOUS | Status: DC
  Filled 2011-04-01: qty 3

## 2011-04-01 MED ORDER — LACTATED RINGERS IV SOLN: INTRAVENOUS | Status: DC

## 2011-04-01 MED ORDER — HEPARIN SODIUM (PORCINE) 1000 UNIT/ML IJ SOLN
INTRAMUSCULAR | Status: DC | PRN
  Administered 2011-04-01: 3000 [IU] via INTRAVENOUS
  Administered 2011-04-01: 2000 [IU] via INTRAVENOUS
  Administered 2011-04-01: 43000 [IU] via INTRAVENOUS

## 2011-04-01 MED ORDER — SODIUM CHLORIDE 0.9 % IV SOLN: 250.0000 mL | INTRAVENOUS | Status: DC

## 2011-04-01 MED ORDER — THROMBIN 5000 UNITS EX SOLR
CUTANEOUS | Status: DC | PRN
  Administered 2011-04-01: 5000 [IU] via TOPICAL

## 2011-04-01 MED ORDER — 0.9 % SODIUM CHLORIDE (POUR BTL) OPTIME
TOPICAL | Status: DC | PRN
  Administered 2011-04-01: 5000 mL

## 2011-04-01 MED ORDER — NITROPRUSSIDE SODIUM 25 MG/ML IV SOLN
0.2500 ug/kg/min | INTRAVENOUS | Status: DC
  Administered 2011-04-01: 0.1 ug/kg/min via INTRAVENOUS
  Administered 2011-04-02: 0.5 ug/kg/min via INTRAVENOUS
  Administered 2011-04-02: 0.25 ug/kg/min via INTRAVENOUS
  Administered 2011-04-02: 1 ug/kg/min via INTRAVENOUS
  Filled 2011-04-01 (×6): qty 2

## 2011-04-01 MED ORDER — SODIUM CHLORIDE 0.9 % IJ SOLN
OROMUCOSAL | Status: DC | PRN
  Administered 2011-04-01: 10:00:00 via TOPICAL

## 2011-04-01 MED ORDER — SODIUM CHLORIDE 0.9 % IV SOLN
0.1000 ug/kg/h | INTRAVENOUS | Status: DC
  Administered 2011-04-01 (×2): 0.3 ug/kg/h via INTRAVENOUS
  Administered 2011-04-01 – 2011-04-02 (×3): 0.7 ug/kg/h via INTRAVENOUS
  Filled 2011-04-01 (×7): qty 2

## 2011-04-01 MED ORDER — MIDAZOLAM HCL 5 MG/5ML IJ SOLN
INTRAMUSCULAR | Status: DC | PRN
  Administered 2011-04-01 (×2): 2 mg via INTRAVENOUS
  Administered 2011-04-01: 4 mg via INTRAVENOUS
  Administered 2011-04-01 (×2): 2 mg via INTRAVENOUS

## 2011-04-01 MED ORDER — ACETAMINOPHEN 650 MG RE SUPP
650.0000 mg | RECTAL | Status: AC
  Administered 2011-04-01: 650 mg via RECTAL

## 2011-04-01 MED ORDER — PANTOPRAZOLE SODIUM 40 MG PO TBEC
40.0000 mg | DELAYED_RELEASE_TABLET | Freq: Every day | ORAL | Status: DC
  Administered 2011-04-03 – 2011-04-04 (×2): 40 mg via ORAL
  Filled 2011-04-01 (×2): qty 1

## 2011-04-01 MED ORDER — MEPERIDINE HCL 50 MG/ML IJ SOLN
INTRAMUSCULAR | Status: AC
  Administered 2011-04-01: 12.5 mg via INTRAVENOUS
  Filled 2011-04-01: qty 1

## 2011-04-01 MED ORDER — PHENYLEPHRINE HCL 10 MG/ML IJ SOLN
0.0000 ug/min | INTRAVENOUS | Status: DC
  Filled 2011-04-01: qty 2

## 2011-04-01 MED ORDER — ACETAMINOPHEN 160 MG/5ML PO SOLN: 650.0000 mg | ORAL | Status: AC

## 2011-04-01 MED ORDER — ASPIRIN EC 325 MG PO TBEC
325.0000 mg | DELAYED_RELEASE_TABLET | Freq: Every day | ORAL | Status: DC
  Administered 2011-04-02 – 2011-04-08 (×7): 325 mg via ORAL
  Filled 2011-04-01 (×7): qty 1

## 2011-04-01 MED ORDER — OMEGA-3-ACID ETHYL ESTERS 1 G PO CAPS
3.0000 g | ORAL_CAPSULE | Freq: Every day | ORAL | Status: DC
  Administered 2011-04-03: 1 g via ORAL
  Filled 2011-04-01: qty 3

## 2011-04-01 MED ORDER — PROPOFOL 10 MG/ML IV EMUL
INTRAVENOUS | Status: DC | PRN
  Administered 2011-04-01: 100 mg via INTRAVENOUS

## 2011-04-01 MED ORDER — METOPROLOL TARTRATE 1 MG/ML IV SOLN
2.5000 mg | INTRAVENOUS | Status: DC | PRN
  Administered 2011-04-02 (×2): 5 mg via INTRAVENOUS
  Filled 2011-04-01: qty 5

## 2011-04-01 MED ORDER — NITROGLYCERIN IN D5W 200-5 MCG/ML-% IV SOLN
0.0000 ug/min | INTRAVENOUS | Status: DC
  Administered 2011-04-01: 75 ug/min via INTRAVENOUS
  Administered 2011-04-02: 50 ug/min via INTRAVENOUS
  Filled 2011-04-01 (×2): qty 250

## 2011-04-01 MED ORDER — DEXTROSE 5 % IV SOLN
1.5000 g | Freq: Two times a day (BID) | INTRAVENOUS | Status: AC
  Administered 2011-04-01 – 2011-04-03 (×4): 1.5 g via INTRAVENOUS
  Filled 2011-04-01 (×4): qty 1.5

## 2011-04-01 MED ORDER — VANCOMYCIN HCL 1000 MG IV SOLR
1000.0000 mg | Freq: Once | INTRAVENOUS | Status: AC
  Administered 2011-04-01: 1000 mg via INTRAVENOUS
  Filled 2011-04-01: qty 1000

## 2011-04-01 MED ORDER — FENTANYL CITRATE 0.05 MG/ML IJ SOLN
INTRAMUSCULAR | Status: DC | PRN
  Administered 2011-04-01: 150 ug via INTRAVENOUS
  Administered 2011-04-01: 250 ug via INTRAVENOUS
  Administered 2011-04-01: 100 ug via INTRAVENOUS
  Administered 2011-04-01: 750 ug via INTRAVENOUS
  Administered 2011-04-01: 100 ug via INTRAVENOUS
  Administered 2011-04-01: 150 ug via INTRAVENOUS
  Administered 2011-04-01: 250 ug via INTRAVENOUS

## 2011-04-01 MED ORDER — SODIUM CHLORIDE 0.9 % IV SOLN
INTRAVENOUS | Status: DC
  Administered 2011-04-02: 06:00:00 via INTRAVENOUS
  Filled 2011-04-01: qty 1

## 2011-04-01 MED ORDER — DOCUSATE SODIUM 100 MG PO CAPS
200.0000 mg | ORAL_CAPSULE | Freq: Every day | ORAL | Status: DC
  Administered 2011-04-02 – 2011-04-05 (×4): 200 mg via ORAL
  Filled 2011-04-01 (×5): qty 2

## 2011-04-01 MED ORDER — MAGNESIUM SULFATE 40 MG/ML IJ SOLN: 4.0000 g | Freq: Once | INTRAMUSCULAR | Status: DC

## 2011-04-01 MED ORDER — MORPHINE SULFATE 4 MG/ML IJ SOLN
2.0000 mg | INTRAMUSCULAR | Status: DC | PRN
  Administered 2011-04-01 (×2): 4 mg via INTRAVENOUS
  Filled 2011-04-01 (×2): qty 1

## 2011-04-01 MED ORDER — SODIUM CHLORIDE 0.9 % IJ SOLN
3.0000 mL | Freq: Two times a day (BID) | INTRAMUSCULAR | Status: DC
  Administered 2011-04-02: 3 mL via INTRAVENOUS
  Administered 2011-04-02: 22:00:00 via INTRAVENOUS
  Administered 2011-04-03 – 2011-04-05 (×5): 3 mL via INTRAVENOUS

## 2011-04-01 MED ORDER — ALBUTEROL SULFATE (5 MG/ML) 0.5% IN NEBU
2.5000 mg | INHALATION_SOLUTION | Freq: Once | RESPIRATORY_TRACT | Status: AC
  Administered 2011-04-01: 2.5 mg via RESPIRATORY_TRACT

## 2011-04-01 MED ORDER — VERAPAMIL HCL 2.5 MG/ML IV SOLN
INTRAVENOUS | Status: DC | PRN
  Administered 2011-04-01: 10 mg via INTRAVENOUS

## 2011-04-01 MED ORDER — DIAZEPAM 5 MG PO TABS
5.0000 mg | ORAL_TABLET | ORAL | Status: AC
  Administered 2011-04-01: 5 mg via ORAL
  Filled 2011-04-01: qty 1

## 2011-04-01 MED ORDER — OXYCODONE HCL 5 MG PO TABS
5.0000 mg | ORAL_TABLET | ORAL | Status: DC | PRN
  Administered 2011-04-02 – 2011-04-05 (×12): 10 mg via ORAL
  Filled 2011-04-01 (×12): qty 2

## 2011-04-01 MED ORDER — INSULIN REGULAR BOLUS VIA INFUSION
0.0000 [IU] | Freq: Three times a day (TID) | INTRAVENOUS | Status: DC
  Administered 2011-04-02: 0 [IU] via INTRAVENOUS
  Filled 2011-04-01 (×7): qty 10

## 2011-04-01 MED ORDER — ACETAMINOPHEN 160 MG/5ML PO SOLN
975.0000 mg | Freq: Four times a day (QID) | ORAL | Status: DC
  Administered 2011-04-02 (×2): 975 mg
  Filled 2011-04-01 (×3): qty 40.6

## 2011-04-01 MED ORDER — MILRINONE IN DEXTROSE 200-5 MCG/ML-% IV SOLN
INTRAVENOUS | Status: DC | PRN
  Administered 2011-04-01: 50 ug/kg/min via INTRAVENOUS
  Administered 2011-04-01: .3 ug/kg/min via INTRAVENOUS

## 2011-04-01 MED ORDER — SODIUM CHLORIDE 0.45 % IV SOLN: INTRAVENOUS | Status: DC

## 2011-04-01 MED ORDER — ONDANSETRON HCL 4 MG/2ML IJ SOLN
4.0000 mg | Freq: Four times a day (QID) | INTRAMUSCULAR | Status: DC | PRN
  Administered 2011-04-03: 4 mg via INTRAVENOUS
  Filled 2011-04-01: qty 2

## 2011-04-01 MED ORDER — BISACODYL 10 MG RE SUPP: 10.0000 mg | Freq: Every day | RECTAL | Status: DC

## 2011-04-01 MED ORDER — PHENYLEPHRINE HCL 10 MG/ML IJ SOLN
10.0000 mg | INTRAVENOUS | Status: DC | PRN
  Administered 2011-04-01: 10 ug/min via INTRAVENOUS

## 2011-04-01 MED ORDER — SODIUM CHLORIDE 0.9 % IJ SOLN: 3.0000 mL | INTRAMUSCULAR | Status: DC | PRN

## 2011-04-01 MED ORDER — SODIUM CHLORIDE 0.9 % IV SOLN
INTRAVENOUS | Status: DC
  Filled 2011-04-01: qty 40

## 2011-04-01 MED ORDER — LACTATED RINGERS IV SOLN
INTRAVENOUS | Status: DC | PRN
  Administered 2011-04-01: 07:00:00 via INTRAVENOUS

## 2011-04-01 MED ORDER — MILRINONE IN DEXTROSE 200-5 MCG/ML-% IV SOLN
0.3000 ug/kg/min | INTRAVENOUS | Status: DC
  Administered 2011-04-02: 0.3 ug/kg/min via INTRAVENOUS
  Filled 2011-04-01 (×3): qty 100

## 2011-04-01 MED ORDER — ALBUMIN HUMAN 5 % IV SOLN: 250.0000 mL | INTRAVENOUS | Status: AC | PRN

## 2011-04-01 MED ORDER — MEPERIDINE HCL 50 MG/ML IJ SOLN
12.5000 mg | INTRAMUSCULAR | Status: AC | PRN
  Administered 2011-04-01 (×2): 12.5 mg via INTRAVENOUS

## 2011-04-01 MED ORDER — METOPROLOL TARTRATE 25 MG/10 ML ORAL SUSPENSION
12.5000 mg | Freq: Two times a day (BID) | ORAL | Status: DC
  Administered 2011-04-01: 12.5 mg
  Filled 2011-04-01 (×3): qty 5

## 2011-04-01 MED ORDER — DOPAMINE-DEXTROSE 3.2-5 MG/ML-% IV SOLN: 0.0000 ug/kg/min | INTRAVENOUS | Status: DC

## 2011-04-01 MED ORDER — NITROGLYCERIN IN D5W 200-5 MCG/ML-% IV SOLN
INTRAVENOUS | Status: DC | PRN
  Administered 2011-04-01: 16.6 ug/min via INTRAVENOUS

## 2011-04-01 MED ORDER — ROCURONIUM BROMIDE 100 MG/10ML IV SOLN
INTRAVENOUS | Status: DC | PRN
  Administered 2011-04-01: 50 mg via INTRAVENOUS
  Administered 2011-04-01 (×3): 20 mg via INTRAVENOUS
  Administered 2011-04-01: 10 mg via INTRAVENOUS
  Administered 2011-04-01: 20 mg via INTRAVENOUS

## 2011-04-01 MED ORDER — FEBUXOSTAT 40 MG PO TABS
80.0000 mg | ORAL_TABLET | Freq: Every day | ORAL | Status: DC
  Administered 2011-04-03 – 2011-04-05 (×3): 80 mg via ORAL
  Filled 2011-04-01 (×3): qty 2

## 2011-04-01 MED ORDER — SODIUM CHLORIDE 0.9 % IR SOLN
Status: DC | PRN
  Administered 2011-04-01: 10:00:00

## 2011-04-01 SURGICAL SUPPLY — 156 items
ADAPTER CARDIO PERF ANTE/RETRO (ADAPTER) ×4 IMPLANT
APPLIER CLIP 9.375 MED OPEN (MISCELLANEOUS)
APPLIER CLIP 9.375 SM OPEN (CLIP)
ATTRACTOMAT 16X20 MAGNETIC DRP (DRAPES) ×4 IMPLANT
BAG DECANTER FOR FLEXI CONT (MISCELLANEOUS) ×8 IMPLANT
BANDAGE ELASTIC 4 VELCRO ST LF (GAUZE/BANDAGES/DRESSINGS) ×4 IMPLANT
BANDAGE ELASTIC 6 VELCRO ST LF (GAUZE/BANDAGES/DRESSINGS) ×4 IMPLANT
BANDAGE GAUZE ELAST BULKY 4 IN (GAUZE/BANDAGES/DRESSINGS) ×4 IMPLANT
BASKET HEART  (ORDER IN 25'S) (MISCELLANEOUS) ×1
BASKET HEART (ORDER IN 25'S) (MISCELLANEOUS) ×1
BASKET HEART (ORDER IN 25S) (MISCELLANEOUS) ×2 IMPLANT
BLADE SAW STERNAL (BLADE) ×4 IMPLANT
BLADE SURG 12 STRL SS (BLADE) ×4 IMPLANT
BLADE SURG ROTATE 9660 (MISCELLANEOUS) IMPLANT
CANISTER SUCTION 2500CC (MISCELLANEOUS) ×4 IMPLANT
CANNULA ARTERIAL 22F 007310 (MISCELLANEOUS) ×4 IMPLANT
CANNULA GUNDRY RCSP 15FR (MISCELLANEOUS) ×4 IMPLANT
CATH CANNON HEMO 15F 50CM (CATHETERS) IMPLANT
CATH CANNON HEMO 15FR 19 (HEMODIALYSIS SUPPLIES) IMPLANT
CATH CANNON HEMO 15FR 23CM (HEMODIALYSIS SUPPLIES) IMPLANT
CATH CANNON HEMO 15FR 31CM (HEMODIALYSIS SUPPLIES) IMPLANT
CATH CANNON HEMO 15FR 32CM (HEMODIALYSIS SUPPLIES) ×4 IMPLANT
CATH CPB KIT VANTRIGT (MISCELLANEOUS) ×4 IMPLANT
CATH ROBINSON RED A/P 18FR (CATHETERS) ×12 IMPLANT
CATH STRAIGHT 5FR 65CM (CATHETERS) ×4 IMPLANT
CATH THORACIC 28FR (CATHETERS) IMPLANT
CATH THORACIC 28FR RT ANG (CATHETERS) IMPLANT
CATH THORACIC 36FR (CATHETERS) IMPLANT
CATH THORACIC 36FR RT ANG (CATHETERS) ×4 IMPLANT
CLIP APPLIE 9.375 MED OPEN (MISCELLANEOUS) IMPLANT
CLIP APPLIE 9.375 SM OPEN (CLIP) IMPLANT
CLIP FOGARTY SPRING 6M (CLIP) IMPLANT
CLIP TI MEDIUM 24 (CLIP) IMPLANT
CLIP TI WIDE RED SMALL 24 (CLIP) ×8 IMPLANT
CLOTH BEACON ORANGE TIMEOUT ST (SAFETY) ×8 IMPLANT
CONN Y 3/8X3/8X3/8  BEN (MISCELLANEOUS)
CONN Y 3/8X3/8X3/8 BEN (MISCELLANEOUS) IMPLANT
COVER PROBE W GEL 5X96 (DRAPES) ×4 IMPLANT
COVER SURGICAL LIGHT HANDLE (MISCELLANEOUS) ×12 IMPLANT
CRADLE DONUT ADULT HEAD (MISCELLANEOUS) ×4 IMPLANT
DERMABOND ADVANCED (GAUZE/BANDAGES/DRESSINGS) ×2
DERMABOND ADVANCED .7 DNX12 (GAUZE/BANDAGES/DRESSINGS) ×2 IMPLANT
DRAIN CHANNEL 32F RND 10.7 FF (WOUND CARE) ×4 IMPLANT
DRAPE C-ARM 42X72 X-RAY (DRAPES) ×4 IMPLANT
DRAPE CARDIOVASCULAR INCISE (DRAPES) ×2
DRAPE CHEST BREAST 15X10 FENES (DRAPES) ×4 IMPLANT
DRAPE SLUSH MACHINE 52X66 (DRAPES) IMPLANT
DRAPE SLUSH/WARMER DISC (DRAPES) ×4 IMPLANT
DRAPE SRG 135X102X78XABS (DRAPES) ×2 IMPLANT
DRSG COVADERM 4X10 (GAUZE/BANDAGES/DRESSINGS) ×4 IMPLANT
DRSG COVADERM 4X14 (GAUZE/BANDAGES/DRESSINGS) ×4 IMPLANT
DRSG OPSITE 6X11 MED (GAUZE/BANDAGES/DRESSINGS) ×4 IMPLANT
ELECT BLADE 4.0 EZ CLEAN MEGAD (MISCELLANEOUS) ×4
ELECT BLADE 6.5 EXT (BLADE) ×4 IMPLANT
ELECT CAUTERY BLADE 6.4 (BLADE) ×4 IMPLANT
ELECT REM PT RETURN 9FT ADLT (ELECTROSURGICAL) ×8
ELECTRODE BLDE 4.0 EZ CLN MEGD (MISCELLANEOUS) ×2 IMPLANT
ELECTRODE REM PT RTRN 9FT ADLT (ELECTROSURGICAL) ×4 IMPLANT
GAUZE SPONGE 2X2 8PLY STRL LF (GAUZE/BANDAGES/DRESSINGS) ×2 IMPLANT
GAUZE SPONGE 4X4 16PLY XRAY LF (GAUZE/BANDAGES/DRESSINGS) ×4 IMPLANT
GLOVE BIO SURGEON STRL SZ 6 (GLOVE) ×12 IMPLANT
GLOVE BIO SURGEON STRL SZ 6.5 (GLOVE) ×9 IMPLANT
GLOVE BIO SURGEON STRL SZ7 (GLOVE) ×4 IMPLANT
GLOVE BIO SURGEON STRL SZ7.5 (GLOVE) ×12 IMPLANT
GLOVE BIO SURGEONS STRL SZ 6.5 (GLOVE) ×3
GLOVE BIOGEL PI IND STRL 6 (GLOVE) ×6 IMPLANT
GLOVE BIOGEL PI IND STRL 6.5 (GLOVE) IMPLANT
GLOVE BIOGEL PI IND STRL 7.0 (GLOVE) IMPLANT
GLOVE BIOGEL PI IND STRL 7.5 (GLOVE) ×2 IMPLANT
GLOVE BIOGEL PI INDICATOR 6 (GLOVE) ×6
GLOVE BIOGEL PI INDICATOR 6.5 (GLOVE)
GLOVE BIOGEL PI INDICATOR 7.0 (GLOVE)
GLOVE BIOGEL PI INDICATOR 7.5 (GLOVE) ×2
GLOVE EUDERMIC 7 POWDERFREE (GLOVE) IMPLANT
GLOVE ORTHO TXT STRL SZ7.5 (GLOVE) ×4 IMPLANT
GOWN BRE IMP SLV AUR XL STRL (GOWN DISPOSABLE) ×20 IMPLANT
GOWN STRL NON-REIN LRG LVL3 (GOWN DISPOSABLE) ×16 IMPLANT
HEMOSTAT POWDER SURGIFOAM 1G (HEMOSTASIS) ×16 IMPLANT
HEMOSTAT SURGICEL 2X14 (HEMOSTASIS) ×4 IMPLANT
INSERT FOGARTY 61MM (MISCELLANEOUS) IMPLANT
INSERT FOGARTY XLG (MISCELLANEOUS) IMPLANT
KIT BASIN OR (CUSTOM PROCEDURE TRAY) ×8 IMPLANT
KIT ROOM TURNOVER OR (KITS) ×8 IMPLANT
KIT SUCTION CATH 14FR (SUCTIONS) ×4 IMPLANT
KIT VASOVIEW W/TROCAR VH 2000 (KITS) ×4 IMPLANT
LEAD PACING MYOCARDI (MISCELLANEOUS) ×4 IMPLANT
MARKER GRAFT CORONARY BYPASS (MISCELLANEOUS) ×12 IMPLANT
NEEDLE 18GX1X1/2 (RX/OR ONLY) (NEEDLE) ×4 IMPLANT
NEEDLE HYPO 25GX1X1/2 BEV (NEEDLE) ×4 IMPLANT
NS IRRIG 1000ML POUR BTL (IV SOLUTION) ×24 IMPLANT
PACK OPEN HEART (CUSTOM PROCEDURE TRAY) ×4 IMPLANT
PACK SURGICAL SETUP 50X90 (CUSTOM PROCEDURE TRAY) ×4 IMPLANT
PAD ARMBOARD 7.5X6 YLW CONV (MISCELLANEOUS) ×16 IMPLANT
PENCIL BUTTON HOLSTER BLD 10FT (ELECTRODE) ×4 IMPLANT
PUNCH AORTIC ROTATE 4.0MM (MISCELLANEOUS) IMPLANT
PUNCH AORTIC ROTATE 4.5MM 8IN (MISCELLANEOUS) ×4 IMPLANT
PUNCH AORTIC ROTATE 5MM 8IN (MISCELLANEOUS) IMPLANT
SET CARDIOPLEGIA MPS 5001102 (MISCELLANEOUS) ×4 IMPLANT
SOAP 2 % CHG 4 OZ (WOUND CARE) ×4 IMPLANT
SOLUTION ANTI FOG 6CC (MISCELLANEOUS) IMPLANT
SPONGE GAUZE 2X2 STER 10/PKG (GAUZE/BANDAGES/DRESSINGS) ×2
SPONGE GAUZE 4X4 12PLY (GAUZE/BANDAGES/DRESSINGS) ×4 IMPLANT
SPONGE INTESTINAL PEANUT (DISPOSABLE) IMPLANT
SPONGE LAP 18X18 X RAY DECT (DISPOSABLE) IMPLANT
SPONGE LAP 4X18 X RAY DECT (DISPOSABLE) IMPLANT
SUT BONE WAX W31G (SUTURE) ×4 IMPLANT
SUT ETHILON 3 0 PS 1 (SUTURE) ×4 IMPLANT
SUT MNCRL AB 4-0 PS2 18 (SUTURE) ×4 IMPLANT
SUT PROLENE 3 0 SH DA (SUTURE) IMPLANT
SUT PROLENE 3 0 SH1 36 (SUTURE) IMPLANT
SUT PROLENE 4 0 RB 1 (SUTURE) ×3
SUT PROLENE 4 0 SH DA (SUTURE) ×4 IMPLANT
SUT PROLENE 4-0 RB1 .5 CRCL 36 (SUTURE) ×2 IMPLANT
SUT PROLENE 5 0 C 1 36 (SUTURE) IMPLANT
SUT PROLENE 6 0 C 1 30 (SUTURE) ×8 IMPLANT
SUT PROLENE 6 0 CC (SUTURE) IMPLANT
SUT PROLENE 7 0 BV 1 (SUTURE) IMPLANT
SUT PROLENE 7 0 BV1 MDA (SUTURE) IMPLANT
SUT PROLENE 7 0 DA (SUTURE) IMPLANT
SUT PROLENE 7.0 RB 3 (SUTURE) ×12 IMPLANT
SUT PROLENE 8 0 BV175 6 (SUTURE) ×4 IMPLANT
SUT PROLENE BLUE 7 0 (SUTURE) ×12 IMPLANT
SUT PROLENE POLY MONO (SUTURE) IMPLANT
SUT SILK  1 MH (SUTURE)
SUT SILK 1 MH (SUTURE) IMPLANT
SUT SILK 2 0 SH CR/8 (SUTURE) IMPLANT
SUT SILK 3 0 SH CR/8 (SUTURE) IMPLANT
SUT STEEL 6MS V (SUTURE) ×4 IMPLANT
SUT STEEL STERNAL CCS#1 18IN (SUTURE) ×4 IMPLANT
SUT STEEL SZ 6 DBL 3X14 BALL (SUTURE) ×4 IMPLANT
SUT VIC AB 1 CTX 18 (SUTURE) IMPLANT
SUT VIC AB 1 CTX 36 (SUTURE) ×4
SUT VIC AB 1 CTX36XBRD ANBCTR (SUTURE) ×4 IMPLANT
SUT VIC AB 2-0 CT1 27 (SUTURE)
SUT VIC AB 2-0 CT1 TAPERPNT 27 (SUTURE) IMPLANT
SUT VIC AB 2-0 CTX 27 (SUTURE) IMPLANT
SUT VIC AB 3-0 SH 27 (SUTURE)
SUT VIC AB 3-0 SH 27X BRD (SUTURE) IMPLANT
SUT VIC AB 3-0 X1 27 (SUTURE) IMPLANT
SUT VICRYL 4-0 PS2 18IN ABS (SUTURE) ×4 IMPLANT
SUTURE E-PAK OPEN HEART (SUTURE) ×4 IMPLANT
SYR 20CC LL (SYRINGE) ×8 IMPLANT
SYR 30ML LL (SYRINGE) IMPLANT
SYR 3ML LL SCALE MARK (SYRINGE) ×4 IMPLANT
SYR 5ML LL (SYRINGE) ×8 IMPLANT
SYR CONTROL 10ML LL (SYRINGE) ×4 IMPLANT
SYRINGE 10CC LL (SYRINGE) ×4 IMPLANT
SYSTEM SAHARA CHEST DRAIN ATS (WOUND CARE) ×4 IMPLANT
TAPE CLOTH SURG 4X10 WHT LF (GAUZE/BANDAGES/DRESSINGS) ×8 IMPLANT
TOWEL OR 17X24 6PK STRL BLUE (TOWEL DISPOSABLE) ×12 IMPLANT
TOWEL OR 17X26 10 PK STRL BLUE (TOWEL DISPOSABLE) ×12 IMPLANT
TRAY FOLEY IC TEMP SENS 16FR (CATHETERS) ×4 IMPLANT
TUBING INSUFFLATION 10FT LAP (TUBING) ×4 IMPLANT
UNDERPAD 30X30 INCONTINENT (UNDERPADS AND DIAPERS) ×4 IMPLANT
WATER STERILE IRR 1000ML POUR (IV SOLUTION) ×12 IMPLANT
WIRE AMPLATZ SS-J .035X180CM (WIRE) ×4 IMPLANT

## 2011-04-01 NOTE — Anesthesia Postprocedure Evaluation (Signed)
  Anesthesia Post-op Note  Patient: [name]  Procedure(s) Performed:  CORONARY ARTERY BYPASS GRAFTING (CABG); INSERTION OF DIALYSIS CATHETER  Patient Location: SICU  Anesthesia Type: General  Level of Consciousness: unresponsive  Airway and Oxygen Therapy: Patient remains intubated per anesthesia plan and Patient placed on Ventilator (see vital sign flow sheet for setting)  Post-op Pain: none  Post-op Assessment: Post-op Vital signs reviewed, Patient's Cardiovascular Status Stable, Respiratory Function Stable, Patent Airway, No signs of Nausea or vomiting and Pain level controlled, remains intubated s/p CABG  Post-op Vital Signs: Reviewed and stable, remains on Dopamine, Milrinone infusions Complications: No apparent anesthesia complications

## 2011-04-01 NOTE — Brief Op Note (Signed)
03/27/2011 - 04/01/2011  12:25 PM  PATIENT:  [name]  51 y.o. male  PRE-OPERATIVE DIAGNOSIS:S/P NSTEMI  (diffuse multivessel coronary artery disease); chronic kidney disease  POST-OPERATIVE DIAGNOSIS:  S?P NSTEMI (diffuse multivessel coronary artery disease); chronic kidney disease  PROCEDURE:  Procedure(s): 1.INSERTION OF DIALYSIS CATHETER by Dr. Imogene Burn 2.CORONARY ARTERY BYPASS GRAFTING (CABG)x4 (LIMA to LAD, SVG sequentially to OM1 and OM2,SVG to PLB) with EVh from the right thigh and parteil calf by Dr. Donata Clay.   SURGEONS:  Mikey Bussing, MD Nilda Simmer, MD  PHYSICIAN ASSISTANT: Doree Fudge PA-C   ANESTHESIA:   general  EBL:  Total I/O In: 1300 [I.V.:1300] Out: 1160 [Urine:1160]   DRAINS: Mediastinal and Plerual chest tubes   COUNTS CORRECT:  YES  DICTATION: .Dragon Dictation  PLAN OF CARE: Admit to inpatient   PATIENT DISPOSITION:  ICU - intubated and hemodynamically stable.   Delay start of Pharmacological VTE agent (>24hrs) due to surgical blood loss or risk of bleeding:  YES

## 2011-04-01 NOTE — H&P (Signed)
[facility] [place]  Brief Access History and Physical  History of Present Illness  [name] is a 51 y.o. male who presents with chief complaint: CKD Stage V.  The patient presents today for tunneled dialysis catheter placement.    The patient is undergoing CABG today and based on preoperative work-up, the patient's medical team feel he is at high risk of acute renal failure post-op.  Subsequently, [facility] placement was requested.  Past Medical History  Diagnosis Date  . ACHALASIA   . ANEMIA-NOS   . CEREBROVASCULAR ACCIDENT, HX OF   . DEGENERATIVE JOINT DISEASE, SHOULDER   . DEPRESSION   . GERD   . Headache   . HYPERLIPIDEMIA   . HYPERTENSION   . RENAL CALCULUS, HX OF   . RENAL INSUFFICIENCY   . Stroke   . Shortness of breath   . Sleep apnea   . H/O hiatal hernia   . Seizures   . CAD (coronary artery disease), 3 vessel significant disease.  03/28/2011    Past Surgical History  Procedure Date  . Tonsillectomy   . Stomach surgery 2009    achalasia  . Knee surgery 01/07/2011    right    History   Social History  . Marital Status: Married    Spouse Name: N/A    Number of Children: N/A  . Years of Education: N/A   Occupational History  . Not on file.   Social History Main Topics  . Smoking status: Never Smoker   . Smokeless tobacco: Former Neurosurgeon    Types: Chew    Quit date: 03/17/1984  . Alcohol Use: Yes     rarely  . Drug Use: No  . Sexually Active: Not on file   Other Topics Concern  . Not on file   Social History Narrative  . No narrative on file    Family History  Problem Relation Age of Onset  . Hypertension Other   . Stroke Other   . Hyperlipidemia Other     No current facility-administered medications on file prior to encounter.   Current Outpatient Prescriptions on File Prior to Encounter  Medication Sig Dispense Refill  . aspirin 325 MG tablet Take 325 mg by mouth daily.       . cloNIDine (CATAPRES) 0.2 MG  tablet Take 0.2 mg by mouth 3 (three) times daily.       . rosuvastatin (CRESTOR) 20 MG tablet Take 20 mg by mouth at bedtime.       Marland Kitchen terazosin (HYTRIN) 10 MG capsule Take 10 mg by mouth at bedtime.         Allergies  Allergen Reactions  . Shrimp (Shellfish Allergy) Shortness Of Breath  . Atorvastatin Other (See Comments)    REACTION: weakness  . Simvastatin Other (See Comments)    REACTION: abnormal liver tests    Review of Systems: Kidney Disease, As listed above, otherwise negative.  Physical Examination  Filed Vitals:   04/01/11 0200 04/01/11 0300 04/01/11 0400 04/01/11 0500  BP: 122/71 118/70 123/72 132/68  Pulse: 60 58 72 70  Temp:    98 F (36.7 C)  TempSrc:    Oral  Resp:      Height:    6\' 1"  (1.854 m)  Weight:    259 lb 7.7 oz (117.7 kg)  SpO2: 97% 96% 97% 99%   Body mass index is 34.23 kg/(m^2).  General: A&O x 3, WDWN, obese  Pulmonary: Sym exp, good air  movt, CTAB, no rales, rhonchi, & wheezing  Cardiac: RRR, Nl S1, S2, no Murmurs, rubs or gallops  Gastrointestinal: soft, NTND, -G/R, - HSM, - masses, - CVAT B  Musculoskeletal: M/S 5/5 throughout , Extremities without ischemic changes   Laboratory BMET    Component Value Date/Time   NA 137 04/01/2011 0430   K 4.2 04/01/2011 0430   CL 104 04/01/2011 0430   CO2 23 04/01/2011 0430   GLUCOSE 150* 04/01/2011 0430   BUN 40* 04/01/2011 0430   CREATININE 3.41* 04/01/2011 0430   CALCIUM 9.1 04/01/2011 0430   GFRNONAA 20* 04/01/2011 0430   GFRAA 23* 04/01/2011 0430   Lab Results  Component Value Date   WBC 11.6* 04/01/2011   HGB 9.3* 04/01/2011   HCT 28.1* 04/01/2011   MCV 86.7 04/01/2011   PLT 299 04/01/2011   Medical Decision Making  [name] is a 51 y.o. male who presents with: CKD Stage V and CAD req. CABG.   The patient is scheduled for: tunneled dialysis catheter placement  The patient is aware the risks of tunneled dialysis catheter placement include but are not limited to: bleeding,  infection, central venous injury, pneumothorax, possible venous stenosis, possible malpositioning in the venous system, and possible infections related to long-term catheter presence. The patient was aware of these risks and agreed to proceed.  Leonides Sake, MD [facility] [place] Office: [phone] Pager: [phone]  04/01/2011, 7:31 AM

## 2011-04-01 NOTE — Op Note (Signed)
OPERATIVE NOTE  PROCEDURE: 1. Left internal jugular vein  tunneled dialysis catheter placement 2. Left internal jugular vein cannulation under ultrasound guidance  PRE-OPERATIVE DIAGNOSIS: end-stage renal failure  POST-OPERATIVE DIAGNOSIS: same as above  SURGEON: Nilda Simmer, MD  ANESTHESIA: general  ESTIMATED BLOOD LOSS: minimal  FINDING(S): 1.  Tips of the catheter in the right atrium on fluoroscopy 2.  No obvious pneumothorax on fluoroscopy  SPECIMEN(S):  none  INDICATIONS:   [name] is a 51 y.o. male who  presents with chronic kidney disease stage V.  The patient is undergoing coronary artery bypass grafting today and it was felt he would likely go into end stage renal disease after that procedure so pre-operative placement of tunneled dialysis catheter was recommended.  The patient is aware the risks of tunneled dialysis catheter placement include but are not limited to: bleeding, infection, central venous injury, pneumothorax, possible venous stenosis, possible malpositioning in the venous system, and possible infections related to long-term catheter presence. The patient was aware of these risks and agreed to proceed.  DESCRIPTION: After written full informed consent was obtained from the patient, the patient was taken back to the operating room.  Prior to induction, the patient was given IV antibiotics.  After obtaining adequate sedation, the patient was prepped and draped in the standard fashion for a chest or neck tunneled dialysis catheter placement.  I anesthesized the neck cannulation site with local anesthetic, then under ultrasound guidance, the left internal jugular vein vein was cannulated with the 18 gauge needle.  A J-wire was then placed down in the right ventricle under fluroscopic guidance.  The wire was then secured in place with a clamp to the drapes.  I then made stab incisions are the neck and exit sites.  I dissected from the chest to the neck  and dilated the subcutaneous tunnel with a plastic dilator.  The wire was then unclamped and I removed the needle.  An end-hole catheter was loaded over the wire and advanced into the superior vena cava.  The wire was then exchanged for an Amplatz super stiff wire.  The catheter was then removed.  Over the wire, the skin tract and venotomy was dilated serially with dilators.  Finally, the dilator-sheath was placed over the wire under fluroscopic guidance into the superior vena cava.  The dilator was removed.  A 27 cm Diatek catheter was woven over the wire and advanced under fluoroscopic guidance down into the right atrium.  The wire was then removed, and the sheath was broken and peeled away while holding the catheter cuff at the level of the skin.  The back end of this catheter was transected, revealing the two lumens of this catheter.  The ports were docked onto these two lumens.  The catheter hub was then screwed into place.  Each port was tested by aspirating and flushing.  No resistance was noted.  Each port was then thoroughly flushed with heparinized saline.  The catheter was secured in placed with two interrupted stitches of 3-0 Nylon tied to the catheter.  The neck incision was closed with a U-stitch of 4-0 Monocryl.  The neck and chest incision were cleaned and sterile bandages applied.  Each port was then loaded with concentrated heparin (1000 Units/mL) at the manufacturer recommended volumes to each port.  Sterile caps were applied to each port.  On completion fluoroscopy, the tips of the catheter were in the right atrium, and there was no evidence of pneumothorax.  COMPLICATIONS: none  CONDITION: stable   Nilda Simmer, MD 04/01/2011 8:44 AM

## 2011-04-01 NOTE — Transfer of Care (Signed)
Immediate Anesthesia Transfer of Care Note  Patient: [name]  Procedure(s) Performed:  CORONARY ARTERY BYPASS GRAFTING (CABG); INSERTION OF DIALYSIS CATHETER  Patient Location: PACU and SICU  Anesthesia Type: General  Level of Consciousness: sedated  Airway & Oxygen Therapy: Patient remains intubated per anesthesia plan  Post-op Assessment: Post -op Vital signs reviewed and stable  Post vital signs: stable Filed Vitals:   04/01/11 1415  BP: 112/54  Pulse: 90  Temp:   Resp: 17    Complications: No apparent anesthesia complications

## 2011-04-01 NOTE — Progress Notes (Signed)
Patient ID: [name], male   DOB: 1960-04-13, 51 y.o.   MRN: [id number]  S: The patient is status post multivessel CABG earlier today and currently intubated and sedated. No intraoperative or immediately postoperative complications noted. Underwent placement of tunneled dialysis catheter prior to surgery.  O:BP 112/54  Pulse 90  Temp(Src) 98 F (36.7 C) (Oral)  Resp 17  Ht 6\' 1"  (1.854 m)  Wt 117.7 kg (259 lb 7.7 oz)  BMI 34.23 kg/m2  SpO2 94%  Intake/Output Summary (Last 24 hours) at 04/01/11 1436 Last data filed at 04/01/11 1400  Gross per 24 hour  Intake 4515.67 ml  Output   4010 ml  Net 505.67 ml   Weight change: 0.1 kg (3.5 oz) Gen: Intubated, sedated, multiple drains in situ. CVS: Pulse is regular in rate and rhythm distant S1 and S2 heart sounds Resp: Anteriorly clear to auscultation, no rales/rhonchi Abd: Obese, soft, midline drains noted. Ext: No obvious lower extremity edema.      Marland Kitchen acetaminophen (TYLENOL) oral liquid 160 mg/5 mL  650 mg Per Tube NOW   Or  . acetaminophen  650 mg Rectal NOW  . acetaminophen  1,000 mg Oral Q6H   Or  . acetaminophen (TYLENOL) oral liquid 160 mg/5 mL  975 mg Per Tube Q6H  . aminocaproic acid (AMICAR) for OHS   Intravenous To OR  . antiseptic oral rinse  15 mL Mouth Rinse BID  . aspirin EC  325 mg Oral Daily   Or  . aspirin  324 mg Per Tube Daily  . bisacodyl  10 mg Oral Daily   Or  . bisacodyl  10 mg Rectal Daily  . cefUROXime (ZINACEF)  IV  1.5 g Intravenous To OR  . cefUROXime (ZINACEF)  IV  1.5 g Intravenous Q12H  . chlorhexidine  60 mL Topical Once  . chlorhexidine  60 mL Topical Once  . darbepoetin (ARANESP) injection - NON-DIALYSIS  100 mcg Subcutaneous Q Mon-1800  . dexmedetomidine (PRECEDEX) IV infusion for high rates  0.1-0.7 mcg/kg/hr Intravenous To OR  . diazepam  5 mg Oral On Call  . docusate sodium  200 mg Oral Daily  . famotidine (PEPCID) IV  20 mg Intravenous Q12H  . febuxostat  80 mg Oral Daily  .  insulin aspart  0-24 Units Subcutaneous Q4H  . magnesium sulfate  4 g Intravenous Once  . metoprolol tartrate  12.5 mg Oral BID   Or  . metoprolol tartrate  12.5 mg Per Tube BID  . metoprolol tartrate  12.5 mg Oral Once  . nitroglycerin/verapamil/heparin solution irrigation for artery spasm   Irrigation To OR  . omega-3 acid ethyl esters  3 g Oral Daily  . pantoprazole  40 mg Oral Q1200  . rosuvastatin  20 mg Oral q1800  . sodium chloride  3 mL Intravenous Q12H  . vancomycin (VANCOCIN) IVPB 1000 mg/100 mL central line  1,000 mg Intravenous Once  . vancomycin  1,500 mg Intravenous To OR  . DISCONTD: active partnership for health of your heart book   Does not apply Once  . DISCONTD: aminocaproic acid (AMICAR) for OHS   Intravenous To OR  . DISCONTD: amLODipine  10 mg Oral Daily  . DISCONTD: aspirin  325 mg Oral Daily  . DISCONTD: cefUROXime (ZINACEF)  IV  750 mg Intravenous To OR  . DISCONTD: chlorhexidine  60 mL Topical Once  . DISCONTD: cloNIDine  0.2 mg Oral TID  . DISCONTD: DOPamine  2-20 mcg/kg/min Intravenous To  OR  . DISCONTD: epinephrine  0.5-20 mcg/min Intravenous To OR  . DISCONTD: febuxostat  80 mg Oral Daily  . DISCONTD: insulin (NOVOLIN-R) infusion   Intravenous To OR  . DISCONTD: magnesium sulfate  40 mEq Other To OR  . DISCONTD: metoprolol tartrate  50 mg Oral Q6H  . DISCONTD: nitroGLYCERIN  2-200 mcg/min Intravenous To OR  . DISCONTD: omega-3 acid ethyl esters  3 g Oral Daily  . DISCONTD: phenylephrine (NEO-SYNEPHRINE) Adult infusion  30-200 mcg/min Intravenous To OR  . DISCONTD: polyethylene glycol  17 g Oral Daily  . DISCONTD: potassium chloride  80 mEq Other To OR  . DISCONTD: sodium bicarbonate  650 mg Oral TID  . DISCONTD: sodium chloride  3 mL Intravenous Q12H  . DISCONTD: terazosin  10 mg Oral QHS   Dg Chest Portable 1 View  04/01/2011  *RADIOLOGY REPORT*  Clinical Data: Coronary artery disease.  Status post CABG.  PORTABLE CHEST - 1 VIEW  Comparison:  03/27/2011  Findings: The patient has undergone CABG.  Endotracheal tube, Swan- Ganz catheter, bilateral central venous catheters (three), and left chest tube in place.  No pneumothorax.  Heart size and vascularity are normal.  No infiltrates or effusions.  IMPRESSION: Satisfactory postoperative appearance of the chest with no pneumothorax.  Original Report Authenticated By: Gwynn Burly, M.D.   Dg Fluoro Guide Cv Line-no Report  04/01/2011  CLINICAL DATA: catheter insertion   FLOURO GUIDE CV LINE  Fluoroscopy was utilized by the requesting physician.  No radiographic  interpretation.     BMET  Lab 04/01/11 1319 04/01/11 1230 04/01/11 1132 04/01/11 1046 04/01/11 1025 04/01/11 0812 04/01/11 0430 03/31/11 0500 03/30/11 0630 03/29/11 0630 03/28/11 0505 03/27/11 0600 03/27/11 0136  NA 137 135 135 135 137 136 137 -- -- -- -- -- --  K 4.8 5.5* 5.2* 4.6 4.5 4.2 4.2 -- -- -- -- -- --  CL -- -- -- -- -- -- 104 107 105 105 106 103 102  CO2 -- -- -- -- -- -- 23 21 22 19 21 22 24   GLUCOSE 158* 125* 117* 110* 109* 138* 150* -- -- -- -- -- --  BUN -- -- -- -- -- -- 40* 42* 41* 40* 35* 37* 37*  CREATININE -- -- -- -- -- -- 3.41* 3.63* 3.46* 3.45* 3.46* 3.70* 3.80*  ALB -- -- -- -- -- -- -- -- -- -- -- -- --  CALCIUM -- -- -- -- -- -- 9.1 9.0 9.1 9.3 9.2 9.2 9.7  PHOS -- -- -- -- -- -- 4.1 3.3 3.6 3.1 -- -- --   CBC  Lab 04/01/11 1319 04/01/11 1230 04/01/11 1200 04/01/11 1132 04/01/11 0400 03/31/11 0500 03/30/11 0630 03/29/11 0630 03/27/11 0136  WBC -- -- -- -- 11.6* 11.9* 13.5* 16.1* --  NEUTROABS -- -- -- -- -- -- -- -- 7.6  HGB 9.2* 8.5* 8.5* 8.8* -- -- -- -- --  HCT 27.0* 25.0* 25.0* 26.0* -- -- -- -- --  MCV -- -- -- -- 86.7 86.5 86.5 87.0 --  PLT -- -- 200 -- 299 300 297 -- --     Assessment/Plan:  1. Chronic kidney disease stage IV: We'll continue to monitor closely for acute renal failure on chronic kidney disease stage IV given the hemodynamic nature of the surgery. We'll monitor closely  for rising potassium levels as an indicator to prompt intervention. Voiding status appears to be a satisfactory level at this time and does not appear to be compromising  his respiratory status. Serial (every 6 hours) potassium level checks noted. Left IJ tunneled hemodialysis catheter is in situ if needed for urgent/emergent hemodialysis. 2. Coronary artery disease status post non-ST elevation MI and now status post multivessel CABG: Management per cardiothoracic surgery, currently on inotropic and pressor support.  3. Hypertension: Fairly controlled at this time, continue to monitor for possible need for intervention. 4. Anemia of chronic kidney disease: On aranesp therapy, monitor for postoperative drop in the need for PRBCs.    Danyelle Brookover K.

## 2011-04-01 NOTE — OR Nursing (Signed)
Time Out performed at 0820 with Dr. Imogene Burn present, prior to incision at [phone] for insertion of diatek catheter. Time Out performed at 0922 with Dr. Donata Clay present; leg incision at 0923 and chest incision at 0924.

## 2011-04-01 NOTE — Progress Notes (Signed)
LDiona Fanti, PAC notified of HR 52.  Will hold 70M dose of Lopressor per order.

## 2011-04-01 NOTE — Progress Notes (Signed)
Patient ID: [name], male   DOB: March 24, 1960, 51 y.o.   MRN: [id number]   Filed Vitals:   04/01/11 1830 04/01/11 1845 04/01/11 1900 04/01/11 1915  BP:      Pulse: 97 97 96 95  Temp: 98.2 F (36.8 C) 98.4 F (36.9 C) 98.4 F (36.9 C) 98.4 F (36.9 C)  TempSrc:      Resp: 21 18 12 16   Height:      Weight:      SpO2: 98% 99% 100% 100%   CI= 3.3 on dop 3, milrinone 0.3  RT had difficulty ventilating him earlier and had to put him on pressure control and 100% FiO2.  CXR postop looks ok. PAP43/25.  I think it is best to keep him on the vent overnight with renal failure and reassess in am.  CBC    Component Value Date/Time   WBC 20.0* 04/01/2011 1430   RBC 3.13* 04/01/2011 1430   HGB 9.2* 04/01/2011 1430   HCT 26.8* 04/01/2011 1430   PLT 184 04/01/2011 1430   MCV 85.6 04/01/2011 1430   MCH 29.4 04/01/2011 1430   MCHC 34.3 04/01/2011 1430   RDW 13.9 04/01/2011 1430   LYMPHSABS 1.3 03/27/2011 0136   MONOABS 0.9 03/27/2011 0136   EOSABS 0.2 03/27/2011 0136   BASOSABS 0.1 03/27/2011 0136    BMET    Component Value Date/Time   NA 139 04/01/2011 1421   K 4.3 04/01/2011 1421   CL 104 04/01/2011 0430   CO2 23 04/01/2011 0430   GLUCOSE 146* 04/01/2011 1421   BUN 40* 04/01/2011 0430   CREATININE 3.41* 04/01/2011 0430   CALCIUM 9.1 04/01/2011 0430   GFRNONAA 20* 04/01/2011 0430   GFRAA 23* 04/01/2011 0430

## 2011-04-01 NOTE — Progress Notes (Signed)
The patient was examined and preop studies reviewed. There has been no change from the prior exam and the patient is ready for surgery. Will procede with multivessel CABG.

## 2011-04-01 NOTE — Anesthesia Preprocedure Evaluation (Addendum)
Anesthesia Evaluation  Patient identified by MRN, date of birth, ID band Patient awake    Reviewed: Allergy & Precautions, H&P , NPO status , Patient's Chart, lab work & pertinent test results, reviewed documented beta blocker date and time   Airway Mallampati: III TM Distance: >3 FB Neck ROM: Full    Dental No notable dental hx. (+) Dental Advisory Given   Pulmonary shortness of breath and with exertion, sleep apnea (does not use CPAP) ,  clear to auscultation  Pulmonary exam normal       Cardiovascular hypertension, Pt. 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    Peds  Hematology   Anesthesia Other Findings   Reproductive/Obstetrics                         Anesthesia Physical Anesthesia Plan  ASA: III  Anesthesia Plan: General   Post-op Pain Management:    Induction: Intravenous  Airway Management Planned: Oral ETT  Additional Equipment: Arterial line and PA Cath  Intra-op Plan:   Post-operative Plan: Post-operative intubation/ventilation  Informed Consent: I have reviewed the patients History and Physical, chart, labs and discussed the procedure including the risks, benefits and alternatives for the proposed anesthesia with the patient or authorized representative who has indicated his/her understanding and acceptance.   Dental advisory given  Plan Discussed with: CRNA and Surgeon  Anesthesia Plan Comments: (Plan routine monitors,  A-line, PA cath, GETA with post op ventilation)        Anesthesia Quick Evaluation

## 2011-04-02 ENCOUNTER — Inpatient Hospital Stay (HOSPITAL_COMMUNITY): Payer: Medicare Other

## 2011-04-02 DIAGNOSIS — IMO0001 Reserved for inherently not codable concepts without codable children: Secondary | ICD-10-CM

## 2011-04-02 DIAGNOSIS — E1165 Type 2 diabetes mellitus with hyperglycemia: Secondary | ICD-10-CM

## 2011-04-02 LAB — CREATININE, SERUM
GFR calc Af Amer: 20 mL/min — ABNORMAL LOW (ref >90)
GFR calc non Af Amer: 17 mL/min — ABNORMAL LOW (ref >90)

## 2011-04-02 LAB — CBC
HCT: 26.8 % — ABNORMAL LOW (ref 39.0–52.0)
HCT: 26.4 % — ABNORMAL LOW (ref 39.0–52.0)
Hemoglobin: 9 g/dL — ABNORMAL LOW (ref 13.0–17.0)
Hemoglobin: 8.9 g/dL — ABNORMAL LOW (ref 13.0–17.0)
MCH: 28.8 pg (ref 26.0–34.0)
MCHC: 33.6 g/dL (ref 30.0–36.0)
MCHC: 33.7 g/dL (ref 30.0–36.0)
MCV: 85.9 fL (ref 78.0–100.0)
MCV: 86 fL (ref 78.0–100.0)
Platelets: 236 10*3/uL (ref 150–400)
RBC: 3.12 MIL/uL — ABNORMAL LOW (ref 4.22–5.81)
RDW: 14.2 % (ref 11.5–15.5)
WBC: 15.1 10*3/uL — ABNORMAL HIGH (ref 4.0–10.5)
WBC: 17.8 10*3/uL — ABNORMAL HIGH (ref 4.0–10.5)

## 2011-04-02 LAB — BASIC METABOLIC PANEL
Chloride: 107 mEq/L (ref 96–112)
GFR calc Af Amer: 23 mL/min — ABNORMAL LOW (ref >90)
GFR calc non Af Amer: 20 mL/min — ABNORMAL LOW (ref >90)
Potassium: 4.6 mEq/L (ref 3.5–5.1)
Sodium: 136 mEq/L (ref 135–145)

## 2011-04-02 LAB — POCT I-STAT 3, ART BLOOD GAS (G3+)
Acid-base deficit: 6 mmol/L — ABNORMAL HIGH (ref 0.0–2.0)
Acid-base deficit: 4 mmol/L — ABNORMAL HIGH (ref 0.0–2.0)
Bicarbonate: 18.4 mEq/L — ABNORMAL LOW (ref 20.0–24.0)
Bicarbonate: 19.8 mEq/L — ABNORMAL LOW (ref 20.0–24.0)
Bicarbonate: 20.6 mEq/L (ref 20.0–24.0)
Patient temperature: 37.7
Patient temperature: 37.7
TCO2: 21 mmol/L (ref 0–100)
pCO2 arterial: 32.6 mmHg — ABNORMAL LOW (ref 35.0–45.0)
pCO2 arterial: 33.9 mmHg — ABNORMAL LOW (ref 35.0–45.0)
pH, Arterial: 7.369 (ref 7.350–7.450)
pH, Arterial: 7.344 — ABNORMAL LOW (ref 7.350–7.450)
pH, Arterial: 7.404 (ref 7.350–7.450)
pH, Arterial: 7.392 (ref 7.350–7.450)
pO2, Arterial: 147 mmHg — ABNORMAL HIGH (ref 80.0–100.0)
pO2, Arterial: 56 mmHg — ABNORMAL LOW (ref 80.0–100.0)
pO2, Arterial: 55 mmHg — ABNORMAL LOW (ref 80.0–100.0)
pO2, Arterial: 64 mmHg — ABNORMAL LOW (ref 80.0–100.0)

## 2011-04-02 LAB — GLUCOSE, CAPILLARY
Glucose-Capillary: 105 mg/dL — ABNORMAL HIGH (ref 70–99)
Glucose-Capillary: 115 mg/dL — ABNORMAL HIGH (ref 70–99)
Glucose-Capillary: 128 mg/dL — ABNORMAL HIGH (ref 70–99)
Glucose-Capillary: 144 mg/dL — ABNORMAL HIGH (ref 70–99)
Glucose-Capillary: 150 mg/dL — ABNORMAL HIGH (ref 70–99)
Glucose-Capillary: 157 mg/dL — ABNORMAL HIGH (ref 70–99)
Glucose-Capillary: 160 mg/dL — ABNORMAL HIGH (ref 70–99)

## 2011-04-02 LAB — POCT I-STAT, CHEM 8
BUN: 35 mg/dL — ABNORMAL HIGH (ref 6–23)
Chloride: 110 mEq/L (ref 96–112)
HCT: 29 % — ABNORMAL LOW (ref 39.0–52.0)
Potassium: 4.1 mEq/L (ref 3.5–5.1)
Sodium: 136 mEq/L (ref 135–145)

## 2011-04-02 LAB — MAGNESIUM: Magnesium: 2 mg/dL (ref 1.5–2.5)

## 2011-04-02 MED ORDER — METOPROLOL TARTRATE 25 MG/10 ML ORAL SUSPENSION
12.5000 mg | Freq: Two times a day (BID) | ORAL | Status: DC
  Filled 2011-04-02 (×2): qty 5

## 2011-04-02 MED ORDER — CLONIDINE HCL 0.2 MG PO TABS
0.2000 mg | ORAL_TABLET | Freq: Three times a day (TID) | ORAL | Status: DC
  Administered 2011-04-02 – 2011-04-04 (×8): 0.2 mg via ORAL
  Filled 2011-04-02 (×9): qty 1

## 2011-04-02 MED ORDER — METOPROLOL TARTRATE 50 MG PO TABS
50.0000 mg | ORAL_TABLET | Freq: Two times a day (BID) | ORAL | Status: DC
  Administered 2011-04-02 – 2011-04-05 (×6): 50 mg via ORAL
  Filled 2011-04-02 (×8): qty 1

## 2011-04-02 MED ORDER — METOPROLOL TARTRATE 1 MG/ML IV SOLN: 5.0000 mg | INTRAVENOUS | Status: DC | PRN

## 2011-04-02 MED ORDER — ROSUVASTATIN CALCIUM 20 MG PO TABS
20.0000 mg | ORAL_TABLET | Freq: Every day | ORAL | Status: DC
  Administered 2011-04-02 – 2011-04-08 (×7): 20 mg via ORAL
  Filled 2011-04-02 (×7): qty 1

## 2011-04-02 MED ORDER — MILRINONE IN DEXTROSE 200-5 MCG/ML-% IV SOLN
0.3000 ug/kg/min | INTRAVENOUS | Status: DC
  Filled 2011-04-02: qty 100

## 2011-04-02 MED ORDER — ALISKIREN FUMARATE 150 MG PO TABS
150.0000 mg | ORAL_TABLET | Freq: Every day | ORAL | Status: DC
  Administered 2011-04-02 – 2011-04-05 (×4): 150 mg via ORAL
  Filled 2011-04-02 (×5): qty 1

## 2011-04-02 MED ORDER — METOPROLOL TARTRATE 1 MG/ML IV SOLN
10.0000 mg | INTRAVENOUS | Status: DC | PRN
  Administered 2011-04-02: 10 mg via INTRAVENOUS
  Filled 2011-04-02: qty 10

## 2011-04-02 MED ORDER — SODIUM BICARBONATE 8.4 % IV SOLN
50.0000 meq | Freq: Once | INTRAVENOUS | Status: AC
  Administered 2011-04-02: 50 meq via INTRAVENOUS
  Filled 2011-04-02: qty 50

## 2011-04-02 MED ORDER — COLCHICINE 0.6 MG PO TABS
0.6000 mg | ORAL_TABLET | Freq: Two times a day (BID) | ORAL | Status: DC | PRN
  Filled 2011-04-02: qty 1

## 2011-04-02 MED ORDER — INSULIN GLARGINE 100 UNIT/ML ~~LOC~~ SOLN
20.0000 [IU] | Freq: Two times a day (BID) | SUBCUTANEOUS | Status: DC
  Administered 2011-04-02 – 2011-04-06 (×10): 20 [IU] via SUBCUTANEOUS
  Filled 2011-04-02: qty 3

## 2011-04-02 MED ORDER — INSULIN ASPART 100 UNIT/ML ~~LOC~~ SOLN
0.0000 [IU] | SUBCUTANEOUS | Status: DC
  Administered 2011-04-02 – 2011-04-03 (×2): 4 [IU] via SUBCUTANEOUS
  Administered 2011-04-03 (×3): 2 [IU] via SUBCUTANEOUS
  Administered 2011-04-04: 4 [IU] via SUBCUTANEOUS
  Administered 2011-04-04: 2 [IU] via SUBCUTANEOUS
  Administered 2011-04-05: 4 [IU] via SUBCUTANEOUS
  Filled 2011-04-02: qty 3

## 2011-04-02 MED ORDER — MORPHINE SULFATE 4 MG/ML IJ SOLN
2.0000 mg | INTRAMUSCULAR | Status: DC | PRN
  Administered 2011-04-02 (×2): 4 mg via INTRAVENOUS
  Administered 2011-04-02: 2 mg via INTRAVENOUS
  Administered 2011-04-02 – 2011-04-03 (×5): 4 mg via INTRAVENOUS
  Filled 2011-04-02 (×7): qty 1

## 2011-04-02 MED ORDER — FUROSEMIDE 10 MG/ML IJ SOLN
80.0000 mg | Freq: Two times a day (BID) | INTRAMUSCULAR | Status: DC
  Administered 2011-04-02 – 2011-04-04 (×6): 80 mg via INTRAVENOUS
  Filled 2011-04-02 (×6): qty 8

## 2011-04-02 MED ORDER — TERAZOSIN HCL 5 MG PO CAPS
10.0000 mg | ORAL_CAPSULE | Freq: Every day | ORAL | Status: DC
  Administered 2011-04-02 – 2011-04-07 (×6): 10 mg via ORAL
  Filled 2011-04-02 (×7): qty 2

## 2011-04-02 MED FILL — Magnesium Sulfate Inj 50%: INTRAMUSCULAR | Qty: 10 | Status: AC

## 2011-04-02 MED FILL — Potassium Chloride Inj 2 mEq/ML: INTRAVENOUS | Qty: 40 | Status: AC

## 2011-04-02 NOTE — Op Note (Signed)
NAMEARMANDO, BUKHARI              ACCOUNT NO.:  [id number]  MEDICAL RECORD NO.:  [id number]  LOCATION:  2310                         FACILITY:  [facility]  PHYSICIAN:  Kerin Perna, M.D.  DATE OF BIRTH:  19-Mar-1960  DATE OF PROCEDURE: DATE OF DISCHARGE:                              OPERATIVE REPORT   OPERATION: 1. Coronary artery bypass grafting x4 (left internal mammary artery to     left anterior descending , saphenous vein graft to posterolateral     branch of right coronary, sequential saphenous vein graft to obtuse     marginal 1 and obtuse marginal 2). 2. Endoscopic harvest of right leg greater saphenous vein.  PREOPERATIVE DIAGNOSIS:  Non-ST elevated myocardial infarction, unstable angina, three-vessel coronary disease.  POSTOPERATIVE DIAGNOSIS:  Non-ST elevated myocardial infarction, unstable angina, three-vessel coronary disease.  SURGEON:  Kerin Perna, MD  ASSISTANT:  Doree Fudge, PA  ANESTHESIA:  General.  INDICATIONS:  The patient is a 51 year old African American hypertensive male with chronic renal insufficiency who presented with unstable angina and positive cardiac enzymes was found to have severe three-vessel coronary disease.  His creatinine was 3.5, so a Diatek dialysis catheter was placed prior to the heart operation while under anesthesia.  Prior to surgery, I reviewed the results of the cardiac cath with the patient and his family and discussed indications, benefits, and risks.  He understood the alternatives to surgery.  He understood the risks of bleeding, stroke, MI, infection, and death.  He understood that with his preoperative anemia due to chronic renal failure he would probably need a blood transfusion.  After reviewing these issues, he demonstrated his understanding and agreed to proceed with surgery under what I felt was an informed consent.  OPERATIVE FINDINGS: 1. Severe diffuse calcified coronary disease. 2. Thickened ascending  aorta. 3. Intraoperative anemia with a hemoglobin at the beginning of the     operation 7.8 requiring 2 units of packed cell transfusion.  PROCEDURE:  The patient was brought to the operating room and placed supine on the operating table where general anesthesia was induced under invasive hemodynamic monitoring.  A transesophageal echo probe was placed by the anesthesiologist.  The patient had a Diatek catheter placed via the right IJ by Dr. Imogene Burn.  After this was performed, the patient was re-prepped and draped.  A sternal incision was made and the saphenous vein was harvested endoscopically from the right leg.  The internal mammary artery was harvested as a pedicle graft from its origin at the subclavian vessels.  A sternal retractor was placed using the deep blades due to the patient's obese body habitus, and the pericardium was opened and suspended.  Pursestrings were placed in the ascending aorta and right atrium.  After the vein had been harvested, the patient was fully heparinized and the ACT was therapeutic.  The patient was then cannulated and placed on cardiopulmonary bypass.  The coronaries were identified for grafting.  The LAD was extremely thick.  The right coronary graft was placed very distally.  The circumflex vessels were very thickened and heavily diseased.  Cardioplegia cannulas were placed and the patient was cooled to 32 degrees.  The aortic crossclamp was  applied.  One liter of cold blood cardioplegia was delivered between the antegrade aortic and retrograde coronary sinus catheters.  There was good cardioplegic arrest and septal temperature dropped less than 10 degrees.  Cardioplegia was then delivered every 20 minutes while the cross-clamp was applied.  The distal coronary anastomoses were performed.  Distal graft #1 was to the posterolateral branch of the RCA.  This is a 1.4 mm vessel with proximal 90% stenosis.  A reverse saphenous vein was sewn  end-to-side with running 7-0 Prolene with good flow through the graft.  Distal graft #2 was a sequential vein graft to the OM 1 continuing to the OM 2.  The OM 1 was 1.5-mm vessel with proximal 90% stenosis.  The vein was sewn side-to-side with running 7-0 Prolene with good flow through the graft. Distal bypass graft #3 was to the OM 2 but this was a smaller heavily diseased vessel, 1.3 mm in diameter with a proximal 90% stenosis.  The end of the vein was sewn end-to-side with running 7-0 Prolene. Cardioplegia was redosed through the vein grafts.  Bypass graft #4 was to the distal LAD.  The LAD was extremely thickened.  It had a proximal 80% stenosis.  The left internal mammary artery was brought through an opening in the left lateral pericardium, was brought down onto the LAD and sewn end-to-side with running 8-0 Prolene.  There was excellent flow through the anastomosis after briefly removing the bulldog pedicle clamp.  The bulldog was reapplied and the pedicle was secured to the epicardium.  Cardioplegia was redosed.  While the crossclamp was still in place, 2 proximal vein anastomoses were performed using a 4.5-mm punch running 6-0 Prolene.  Air was vented from the coronaries with a dose of retrograde warm blood cardioplegia, and the crossclamp was removed.  The heart was cardioverted back to a regular rhythm.  Bypass grafts were checked and found to have good flow and hemostasis was documented at the proximal and distal anastomoses.  Temporary pacing wires were applied, the patient was rewarmed to 37 degrees, and the ventilator was resumed. The patient was weaned off bypass on low-dose dopamine for renal function and low-dose milrinone for his elevated PA pressures.  Cardiac output and blood pressure were normal.  The echo showed good global LV function.  Protamine was administered without adverse reaction.  The cannula was removed.  The mediastinum was closed superiorly.   An anterior mediastinal and a left pleural chest tube were placed.  The sternum was closed with interrupted steel wire.  The pectoralis fascia, subcutaneous layer, and skin were closed with running Vicryl.  Total cardiopulmonary bypass time was 130 minutes.     Kerin Perna, M.D.     PV/MEDQ  D:  04/01/2011  T:  04/02/2011  Job:  [id number]  cc:   Nanetta Batty, M.D. [facility] Salvia Eliott Nine, M.D.

## 2011-04-02 NOTE — Progress Notes (Signed)
Patient examined and record reviewed.Hemodynamics stable,labs satisfactory.Patient had stable day.Continue current care.Urine output adequate. VAN TRIGT III,Madinah Quarry 04/02/2011

## 2011-04-02 NOTE — Progress Notes (Signed)
Started SICU rapid wean per MD at this time. [name]

## 2011-04-02 NOTE — Progress Notes (Signed)
[facility] Progress Note  Subjective:  Day 1 s/p CABG x4  Objective:   Vital Signs in the last 24 hours: Temp:  [94.8 F (34.9 C)-100.4 F (38 C)] 99.9 F (37.7 C) (01/16 0900) Pulse Rate:  [73-126] 87  (01/16 0900) Resp:  [0-28] 20  (01/16 0900) BP: (109-195)/(54-84) 147/78 mmHg (01/16 0900) SpO2:  [89 %-100 %] 93 % (01/16 0900) Arterial Line BP: (109-343)/(49-110) 186/79 mmHg (01/16 0900) FiO2 (%):  [39.8 %-100 %] 50 % (01/16 0858) Weight:  [126.8 kg (279 lb 8.7 oz)] 126.8 kg (279 lb 8.7 oz) (01/16 0600)  Intake/Output from previous day: 01/15 0701 - 01/16 0700 In: 4419 [I.V.:2920; Blood:979; NG/GT:120; IV Piggyback:400] Out: 3975 [Urine:2835; Blood:700; Chest Tube:440]  Scheduled:   . acetaminophen (TYLENOL) oral liquid 160 mg/5 mL  650 mg Per Tube NOW   Or  . acetaminophen  650 mg Rectal NOW  . acetaminophen  1,000 mg Oral Q6H   Or  . acetaminophen (TYLENOL) oral liquid 160 mg/5 mL  975 mg Per Tube Q6H  . albuterol  2.5 mg Nebulization Once  . antiseptic oral rinse  15 mL Mouth Rinse BID  . aspirin EC  325 mg Oral Daily   Or  . aspirin  324 mg Per Tube Daily  . bisacodyl  10 mg Oral Daily   Or  . bisacodyl  10 mg Rectal Daily  . cefUROXime (ZINACEF)  IV  1.5 g Intravenous To OR  . cefUROXime (ZINACEF)  IV  1.5 g Intravenous Q12H  . darbepoetin (ARANESP) injection - NON-DIALYSIS  100 mcg Subcutaneous Q Mon-1800  . docusate sodium  200 mg Oral Daily  . famotidine (PEPCID) IV  20 mg Intravenous Q12H  . febuxostat  80 mg Oral Daily  . insulin regular  0-10 Units Intravenous TID WC  . magnesium sulfate  4 g Intravenous Once  . metoprolol tartrate  50 mg Oral BID   Or  . metoprolol tartrate  12.5 mg Per Tube BID  . nitroglycerin/verapamil/heparin solution irrigation for artery spasm   Irrigation To OR  . omega-3 acid ethyl esters  3 g Oral Daily  . pantoprazole  40 mg Oral Q1200  . rosuvastatin  20 mg Oral q1800  . sodium  bicarbonate  50 mEq Intravenous Once  . sodium chloride  3 mL Intravenous Q12H  . vancomycin (VANCOCIN) IVPB 1000 mg/100 mL central line  1,000 mg Intravenous Once  . DISCONTD: active partnership for health of your heart book   Does not apply Once  . DISCONTD: aminocaproic acid (AMICAR) for OHS   Intravenous To OR  . DISCONTD: amLODipine  10 mg Oral Daily  . DISCONTD: aspirin  325 mg Oral Daily  . DISCONTD: cefUROXime (ZINACEF)  IV  750 mg Intravenous To OR  . DISCONTD: chlorhexidine  60 mL Topical Once  . DISCONTD: cloNIDine  0.2 mg Oral TID  . DISCONTD: DOPamine  2-20 mcg/kg/min Intravenous To OR  . DISCONTD: epinephrine  0.5-20 mcg/min Intravenous To OR  . DISCONTD: febuxostat  80 mg Oral Daily  . DISCONTD: insulin aspart  0-24 Units Subcutaneous Q4H  . DISCONTD: insulin (NOVOLIN-R) infusion   Intravenous To OR  . DISCONTD: magnesium sulfate  40 mEq Other To OR  . DISCONTD: metoprolol tartrate  50 mg Oral Q6H  . DISCONTD: metoprolol tartrate  12.5 mg Per Tube BID  . DISCONTD: metoprolol tartrate  12.5 mg Oral BID  . DISCONTD: nitroGLYCERIN  2-200 mcg/min Intravenous  To OR  . DISCONTD: omega-3 acid ethyl esters  3 g Oral Daily  . DISCONTD: phenylephrine (NEO-SYNEPHRINE) Adult infusion  30-200 mcg/min Intravenous To OR  . DISCONTD: polyethylene glycol  17 g Oral Daily  . DISCONTD: potassium chloride  80 mEq Other To OR  . DISCONTD: sodium bicarbonate  650 mg Oral TID  . DISCONTD: sodium chloride  3 mL Intravenous Q12H  . DISCONTD: terazosin  10 mg Oral QHS    Physical Exam:   General appearance: alert, cooperative and no distress Neck: no adenopathy, no carotid bruit and no JVD Lungs: diminished breath sounds bibasilar Heart: S1 S2;  2/6 sem; soft rub Abdomen: soft, non-tender; bowel sounds normal; no masses,  no organomegaly Extremities: trace edema   Rate: 90  Rhythm: sinus  Lab Results: Results for orders placed during the hospital encounter of 03/27/11 (from the past 24  hour(s))  POCT I-STAT 4, (NA,K, GLUC, HGB,HCT)     Status: Abnormal   Collection Time   04/01/11 10:25 AM      Component Value Range   Sodium 137  135 - 145 (mEq/L)   Potassium 4.5  3.5 - 5.1 (mEq/L)   Glucose, Bld 109 (*) 70 - 99 (mg/dL)   HCT 14.7 (*) 82.9 - 52.0 (%)   Hemoglobin 7.8 (*) 13.0 - 17.0 (g/dL)  POCT I-STAT 3, BLOOD GAS (G3+)     Status: Abnormal   Collection Time   04/01/11 10:42 AM      Component Value Range   pH, Arterial 7.299 (*) 7.350 - 7.450    pCO2 arterial 43.5  35.0 - 45.0 (mmHg)   pO2, Arterial 314.0 (*) 80.0 - 100.0 (mmHg)   Bicarbonate 21.4  20.0 - 24.0 (mEq/L)   TCO2 23  0 - 100 (mmol/L)   O2 Saturation 100.0     Acid-base deficit 5.0 (*) 0.0 - 2.0 (mmol/L)   Sample type ARTERIAL    POCT I-STAT 4, (NA,K, GLUC, HGB,HCT)     Status: Abnormal   Collection Time   04/01/11 10:46 AM      Component Value Range   Sodium 135  135 - 145 (mEq/L)   Potassium 4.6  3.5 - 5.1 (mEq/L)   Glucose, Bld 110 (*) 70 - 99 (mg/dL)   HCT 56.2 (*) 13.0 - 52.0 (%)   Hemoglobin 8.5 (*) 13.0 - 17.0 (g/dL)  POCT I-STAT 4, (NA,K, GLUC, HGB,HCT)     Status: Abnormal   Collection Time   04/01/11 11:32 AM      Component Value Range   Sodium 135  135 - 145 (mEq/L)   Potassium 5.2 (*) 3.5 - 5.1 (mEq/L)   Glucose, Bld 117 (*) 70 - 99 (mg/dL)   HCT 86.5 (*) 78.4 - 52.0 (%)   Hemoglobin 8.8 (*) 13.0 - 17.0 (g/dL)  HEMOGLOBIN AND HEMATOCRIT, BLOOD     Status: Abnormal   Collection Time   04/01/11 12:00 PM      Component Value Range   Hemoglobin 8.5 (*) 13.0 - 17.0 (g/dL)   HCT 69.6 (*) 29.5 - 52.0 (%)  PLATELET COUNT     Status: Normal   Collection Time   04/01/11 12:00 PM      Component Value Range   Platelets 200  150 - 400 (K/uL)  PREPARE PLATELET PHERESIS     Status: Normal (Preliminary result)   Collection Time   04/01/11 12:30 PM      Component Value Range   Unit Number [id number]  Blood Component Type PLTPHER LR2     Unit division 00     Status of Unit ISSUED      Transfusion Status OK TO TRANSFUSE    PREPARE FRESH FROZEN PLASMA     Status: Normal (Preliminary result)   Collection Time   04/01/11 12:30 PM      Component Value Range   Unit Number [id number]     Blood Component Type THAWED PLASMA     Unit division 00     Status of Unit ISSUED     Transfusion Status OK TO TRANSFUSE    POCT I-STAT 4, (NA,K, GLUC, HGB,HCT)     Status: Abnormal   Collection Time   04/01/11 12:30 PM      Component Value Range   Sodium 135  135 - 145 (mEq/L)   Potassium 5.5 (*) 3.5 - 5.1 (mEq/L)   Glucose, Bld 125 (*) 70 - 99 (mg/dL)   HCT 40.9 (*) 81.1 - 52.0 (%)   Hemoglobin 8.5 (*) 13.0 - 17.0 (g/dL)  POCT I-STAT 3, BLOOD GAS (G3+)     Status: Abnormal   Collection Time   04/01/11  1:16 PM      Component Value Range   pH, Arterial 7.340 (*) 7.350 - 7.450    pCO2 arterial 37.1  35.0 - 45.0 (mmHg)   pO2, Arterial 140.0 (*) 80.0 - 100.0 (mmHg)   Bicarbonate 20.0  20.0 - 24.0 (mEq/L)   TCO2 21  0 - 100 (mmol/L)   O2 Saturation 99.0     Acid-base deficit 5.0 (*) 0.0 - 2.0 (mmol/L)   Sample type ARTERIAL    POCT I-STAT 4, (NA,K, GLUC, HGB,HCT)     Status: Abnormal   Collection Time   04/01/11  1:19 PM      Component Value Range   Sodium 137  135 - 145 (mEq/L)   Potassium 4.8  3.5 - 5.1 (mEq/L)   Glucose, Bld 158 (*) 70 - 99 (mg/dL)   HCT 91.4 (*) 78.2 - 52.0 (%)   Hemoglobin 9.2 (*) 13.0 - 17.0 (g/dL)  POCT I-STAT 4, (NA,K, GLUC, HGB,HCT)     Status: Abnormal   Collection Time   04/01/11  2:21 PM      Component Value Range   Sodium 139  135 - 145 (mEq/L)   Potassium 4.3  3.5 - 5.1 (mEq/L)   Glucose, Bld 146 (*) 70 - 99 (mg/dL)   HCT 95.6 (*) 21.3 - 52.0 (%)   Hemoglobin 10.2 (*) 13.0 - 17.0 (g/dL)  POCT I-STAT 3, BLOOD GAS (G3+)     Status: Abnormal   Collection Time   04/01/11  2:27 PM      Component Value Range   pH, Arterial 7.351  7.350 - 7.450    pCO2 arterial 37.3  35.0 - 45.0 (mmHg)   pO2, Arterial 74.0 (*) 80.0 - 100.0 (mmHg)   Bicarbonate 21.1   20.0 - 24.0 (mEq/L)   TCO2 22  0 - 100 (mmol/L)   O2 Saturation 95.0     Acid-base deficit 5.0 (*) 0.0 - 2.0 (mmol/L)   Patient temperature 35.0 C     Sample type ARTERIAL    CBC     Status: Abnormal   Collection Time   04/01/11  2:30 PM      Component Value Range   WBC 20.0 (*) 4.0 - 10.5 (K/uL)   RBC 3.13 (*) 4.22 - 5.81 (MIL/uL)   Hemoglobin 9.2 (*) 13.0 - 17.0 (g/dL)  HCT 26.8 (*) 39.0 - 52.0 (%)   MCV 85.6  78.0 - 100.0 (fL)   MCH 29.4  26.0 - 34.0 (pg)   MCHC 34.3  30.0 - 36.0 (g/dL)   RDW 14.7  82.9 - 56.2 (%)   Platelets 184  150 - 400 (K/uL)  PROTIME-INR     Status: Abnormal   Collection Time   04/01/11  2:30 PM      Component Value Range   Prothrombin Time 17.6 (*) 11.6 - 15.2 (seconds)   INR 1.42  0.00 - 1.49   APTT     Status: Abnormal   Collection Time   04/01/11  2:30 PM      Component Value Range   aPTT 39 (*) 24 - 37 (seconds)  GLUCOSE, CAPILLARY     Status: Abnormal   Collection Time   04/01/11  3:42 PM      Component Value Range   Glucose-Capillary 126 (*) 70 - 99 (mg/dL)  POCT I-STAT 3, BLOOD GAS (G3+)     Status: Abnormal   Collection Time   04/01/11  4:18 PM      Component Value Range   pH, Arterial 7.343 (*) 7.350 - 7.450    pCO2 arterial 38.5  35.0 - 45.0 (mmHg)   pO2, Arterial 60.0 (*) 80.0 - 100.0 (mmHg)   Bicarbonate 21.3  20.0 - 24.0 (mEq/L)   TCO2 23  0 - 100 (mmol/L)   O2 Saturation 91.0     Acid-base deficit 4.0 (*) 0.0 - 2.0 (mmol/L)   Patient temperature 35.6 C     Collection site ARTERIAL LINE     Drawn by Operator     Sample type ARTERIAL    GLUCOSE, CAPILLARY     Status: Abnormal   Collection Time   04/01/11  5:05 PM      Component Value Range   Glucose-Capillary 155 (*) 70 - 99 (mg/dL)  GLUCOSE, CAPILLARY     Status: Abnormal   Collection Time   04/01/11  6:20 PM      Component Value Range   Glucose-Capillary 144 (*) 70 - 99 (mg/dL)  POCT I-STAT 3, BLOOD GAS (G3+)     Status: Abnormal   Collection Time   04/01/11  6:47 PM       Component Value Range   pH, Arterial 7.344 (*) 7.350 - 7.450    pCO2 arterial 36.4  35.0 - 45.0 (mmHg)   pO2, Arterial 86.0  80.0 - 100.0 (mmHg)   Bicarbonate 19.8 (*) 20.0 - 24.0 (mEq/L)   TCO2 21  0 - 100 (mmol/L)   O2 Saturation 96.0     Acid-base deficit 5.0 (*) 0.0 - 2.0 (mmol/L)   Patient temperature 36.8 C     Collection site ARTERIAL LINE     Drawn by Operator     Sample type ARTERIAL    GLUCOSE, CAPILLARY     Status: Abnormal   Collection Time   04/01/11  7:03 PM      Component Value Range   Glucose-Capillary 172 (*) 70 - 99 (mg/dL)  POCT I-STAT, CHEM 8     Status: Abnormal   Collection Time   04/01/11  9:21 PM      Component Value Range   Sodium 139  135 - 145 (mEq/L)   Potassium 4.7  3.5 - 5.1 (mEq/L)   Chloride 110  96 - 112 (mEq/L)   BUN 33 (*) 6 - 23 (mg/dL)   Creatinine, Ser 1.30 (*)  0.50 - 1.35 (mg/dL)   Glucose, Bld 454 (*) 70 - 99 (mg/dL)   Calcium, Ion 0.98  1.19 - 1.32 (mmol/L)   TCO2 20  0 - 100 (mmol/L)   Hemoglobin 10.5 (*) 13.0 - 17.0 (g/dL)   HCT 14.7 (*) 82.9 - 52.0 (%)  CBC     Status: Abnormal   Collection Time   04/01/11  9:29 PM      Component Value Range   WBC 18.2 (*) 4.0 - 10.5 (K/uL)   RBC 3.48 (*) 4.22 - 5.81 (MIL/uL)   Hemoglobin 10.1 (*) 13.0 - 17.0 (g/dL)   HCT 56.2 (*) 13.0 - 52.0 (%)   MCV 85.6  78.0 - 100.0 (fL)   MCH 29.0  26.0 - 34.0 (pg)   MCHC 33.9  30.0 - 36.0 (g/dL)   RDW 86.5  78.4 - 69.6 (%)   Platelets 249  150 - 400 (K/uL)  CREATININE, SERUM     Status: Abnormal   Collection Time   04/01/11  9:29 PM      Component Value Range   Creatinine, Ser 3.11 (*) 0.50 - 1.35 (mg/dL)   GFR calc non Af Amer 22 (*) >90 (mL/min)   GFR calc Af Amer 25 (*) >90 (mL/min)  MAGNESIUM     Status: Normal   Collection Time   04/01/11  9:29 PM      Component Value Range   Magnesium 2.1  1.5 - 2.5 (mg/dL)  GLUCOSE, CAPILLARY     Status: Abnormal   Collection Time   04/01/11 10:43 PM      Component Value Range   Glucose-Capillary 168 (*)  70 - 99 (mg/dL)  GLUCOSE, CAPILLARY     Status: Abnormal   Collection Time   04/01/11 11:46 PM      Component Value Range   Glucose-Capillary 160 (*) 70 - 99 (mg/dL)  GLUCOSE, CAPILLARY     Status: Abnormal   Collection Time   04/02/11 12:50 AM      Component Value Range   Glucose-Capillary 157 (*) 70 - 99 (mg/dL)  GLUCOSE, CAPILLARY     Status: Abnormal   Collection Time   04/02/11  1:55 AM      Component Value Range   Glucose-Capillary 150 (*) 70 - 99 (mg/dL)  GLUCOSE, CAPILLARY     Status: Abnormal   Collection Time   04/02/11  2:58 AM      Component Value Range   Glucose-Capillary 144 (*) 70 - 99 (mg/dL)  CBC     Status: Abnormal   Collection Time   04/02/11  3:00 AM      Component Value Range   WBC 15.1 (*) 4.0 - 10.5 (K/uL)   RBC 3.12 (*) 4.22 - 5.81 (MIL/uL)   Hemoglobin 9.0 (*) 13.0 - 17.0 (g/dL)   HCT 29.5 (*) 28.4 - 52.0 (%)   MCV 85.9  78.0 - 100.0 (fL)   MCH 28.8  26.0 - 34.0 (pg)   MCHC 33.6  30.0 - 36.0 (g/dL)   RDW 13.2  44.0 - 10.2 (%)   Platelets 236  150 - 400 (K/uL)  POCT I-STAT 3, BLOOD GAS (G3+)     Status: Abnormal   Collection Time   04/02/11  3:18 AM      Component Value Range   pH, Arterial 7.369  7.350 - 7.450    pCO2 arterial 32.6 (*) 35.0 - 45.0 (mmHg)   pO2, Arterial 147.0 (*) 80.0 - 100.0 (mmHg)   Bicarbonate  18.6 (*) 20.0 - 24.0 (mEq/L)   TCO2 20  0 - 100 (mmol/L)   O2 Saturation 99.0     Acid-base deficit 6.0 (*) 0.0 - 2.0 (mmol/L)   Patient temperature 37.9 C     Collection site ARTERIAL LINE     Drawn by Nurse     Sample type ARTERIAL    BASIC METABOLIC PANEL     Status: Abnormal   Collection Time   04/02/11  4:00 AM      Component Value Range   Sodium 136  135 - 145 (mEq/L)   Potassium 4.6  3.5 - 5.1 (mEq/L)   Chloride 107  96 - 112 (mEq/L)   CO2 19  19 - 32 (mEq/L)   Glucose, Bld 147 (*) 70 - 99 (mg/dL)   BUN 37 (*) 6 - 23 (mg/dL)   Creatinine, Ser 0.45 (*) 0.50 - 1.35 (mg/dL)   Calcium 8.2 (*) 8.4 - 10.5 (mg/dL)   GFR calc non  Af Amer 20 (*) >90 (mL/min)   GFR calc Af Amer 23 (*) >90 (mL/min)  GLUCOSE, CAPILLARY     Status: Abnormal   Collection Time   04/02/11  4:13 AM      Component Value Range   Glucose-Capillary 125 (*) 70 - 99 (mg/dL)  GLUCOSE, CAPILLARY     Status: Abnormal   Collection Time   04/02/11  5:39 AM      Component Value Range   Glucose-Capillary 110 (*) 70 - 99 (mg/dL)  GLUCOSE, CAPILLARY     Status: Abnormal   Collection Time   04/02/11  6:46 AM      Component Value Range   Glucose-Capillary 102 (*) 70 - 99 (mg/dL)  GLUCOSE, CAPILLARY     Status: Abnormal   Collection Time   04/02/11  7:56 AM      Component Value Range   Glucose-Capillary 105 (*) 70 - 99 (mg/dL)  POCT I-STAT 3, BLOOD GAS (G3+)     Status: Abnormal   Collection Time   04/02/11  8:24 AM      Component Value Range   pH, Arterial 7.344 (*) 7.350 - 7.450    pCO2 arterial 34.2 (*) 35.0 - 45.0 (mmHg)   pO2, Arterial 56.0 (*) 80.0 - 100.0 (mmHg)   Bicarbonate 18.4 (*) 20.0 - 24.0 (mEq/L)   TCO2 19  0 - 100 (mmol/L)   O2 Saturation 86.0     Acid-base deficit 6.0 (*) 0.0 - 2.0 (mmol/L)   Patient temperature 37.7 C     Collection site ARTERIAL LINE     Drawn by Operator     Sample type ARTERIAL      Basename 04/02/11 0400 04/01/11 2129 04/01/11 2121 04/01/11 0430  NA 136 -- 139 --  K 4.6 -- 4.7 --  CL 107 -- 110 --  CO2 19 -- -- 23  GLUCOSE 147* -- 199* --  BUN 37* -- 33* --  CREATININE 3.36* 3.11* -- --   No results found for this basename: TROPONINI:2,CK,MB:2 in the last 72 hours   Basename 04/01/11 0430  PROT --  ALBUMIN 2.8*  AST --  ALT --  ALKPHOS --  BILITOT --  BILIDIR --  IBILI --    Basename 04/01/11 1430  INR 1.42       Imaging:  Dg Chest Portable 1 View In Am  04/02/2011  *RADIOLOGY REPORT*  Clinical Data: Post open heart surgery  PORTABLE CHEST - 1 VIEW  Comparison: Portable exam [phone]  hours compared to 04/01/2011  Findings: Tip of endotracheal tube 4.9 cm above carina. Nasogastric tube  extends into stomach. Left thoracostomy tube. Left jugular central venous catheter, tip high right atrium. Right jugular central venous catheter tip projecting over SVC near cavoatrial junction. Right jugular Swan-Ganz catheter also present, tip at main pulmonary artery bifurcation. Enlargement of cardiac silhouette post CABG. Low lung volumes with bibasilar atelectasis. No gross pleural effusion or pneumothorax.  IMPRESSION: Low lung volumes with bibasilar atelectasis.  Original Report Authenticated By: Lollie Marrow, M.D.   Dg Chest Portable 1 View  04/01/2011  *RADIOLOGY REPORT*  Clinical Data: Coronary artery disease.  Status post CABG.  PORTABLE CHEST - 1 VIEW  Comparison: 03/27/2011  Findings: The patient has undergone CABG.  Endotracheal tube, Swan- Ganz catheter, bilateral central venous catheters (three), and left chest tube in place.  No pneumothorax.  Heart size and vascularity are normal.  No infiltrates or effusions.  IMPRESSION: Satisfactory postoperative appearance of the chest with no pneumothorax.  Original Report Authenticated By: Gwynn Burly, M.D.   Dg Fluoro Guide Cv Line-no Report  04/01/2011  CLINICAL DATA: catheter insertion   FLOURO GUIDE CV LINE  Fluoroscopy was utilized by the requesting physician.  No radiographic  interpretation.      Cardiac Studies:    Assessment/Plan:   Principal Problem:  *NSTEMI (non-ST elevated myocardial infarction) pk Troponin 22.9 Active Problems:  HYPERLIPIDEMIA  HYPERTENSION  RENAL INSUFFICIENCY, Cr 3.7  CEREBROVASCULAR ACCIDENT, HX OF CVA X 4  Gout  CAD, 3 vessel significant disease at cath this admission. for CABG this admission   Day 1 S/P CABG x4 ( LIMA - LAD, SVG PLA, SVG OM1 and OM2)  Stable hemodynamics.  Renal fxn stable post op.  Lennette Bihari, MD, [facility] 04/02/2011, 9:21 AM

## 2011-04-02 NOTE — Progress Notes (Signed)
Patient ID: [name], male   DOB: Oct 16, 1960, 51 y.o.   MRN: [id number]  S: Hypertensive overnight requiring nitroprusside drip. Remains intubated and without any other acute events. Good urine output charted and normokalemic.  O:BP 162/68  Pulse 82  Temp(Src) 99.3 F (37.4 C) (Oral)  Resp 4  Ht 6\' 1"  (1.854 m)  Wt 126.8 kg (279 lb 8.7 oz)  BMI 36.88 kg/m2  SpO2 95%  Intake/Output Summary (Last 24 hours) at 04/02/11 0819 Last data filed at 04/02/11 0600  Gross per 24 hour  Intake 4418.99 ml  Output   3975 ml  Net 443.99 ml   Weight change: 9.1 kg (20 lb 1 oz) Gen: Intubated, sedated, appears comfortable CVS: Pulse regular in rate and rhythm, distant S1 and S2 heart sounds Resp: Clear to auscultation bilaterally, no rales Abd: Soft, obese, midline drains in situ, minimal bowel sounds Ext: Trace bipedal edema      . acetaminophen (TYLENOL) oral liquid 160 mg/5 mL  650 mg Per Tube NOW   Or  . acetaminophen  650 mg Rectal NOW  . acetaminophen  1,000 mg Oral Q6H   Or  . acetaminophen (TYLENOL) oral liquid 160 mg/5 mL  975 mg Per Tube Q6H  . albuterol  2.5 mg Nebulization Once  . antiseptic oral rinse  15 mL Mouth Rinse BID  . aspirin EC  325 mg Oral Daily   Or  . aspirin  324 mg Per Tube Daily  . bisacodyl  10 mg Oral Daily   Or  . bisacodyl  10 mg Rectal Daily  . cefUROXime (ZINACEF)  IV  1.5 g Intravenous To OR  . cefUROXime (ZINACEF)  IV  1.5 g Intravenous Q12H  . darbepoetin (ARANESP) injection - NON-DIALYSIS  100 mcg Subcutaneous Q Mon-1800  . docusate sodium  200 mg Oral Daily  . famotidine (PEPCID) IV  20 mg Intravenous Q12H  . febuxostat  80 mg Oral Daily  . insulin regular  0-10 Units Intravenous TID WC  . magnesium sulfate  4 g Intravenous Once  . metoprolol tartrate  12.5 mg Oral BID   Or  . metoprolol tartrate  12.5 mg Per Tube BID  . nitroglycerin/verapamil/heparin solution irrigation for artery spasm   Irrigation To OR  . omega-3 acid ethyl  esters  3 g Oral Daily  . pantoprazole  40 mg Oral Q1200  . rosuvastatin  20 mg Oral q1800  . sodium bicarbonate  50 mEq Intravenous Once  . sodium chloride  3 mL Intravenous Q12H  . vancomycin (VANCOCIN) IVPB 1000 mg/100 mL central line  1,000 mg Intravenous Once  . DISCONTD: active partnership for health of your heart book   Does not apply Once  . DISCONTD: aminocaproic acid (AMICAR) for OHS   Intravenous To OR  . DISCONTD: amLODipine  10 mg Oral Daily  . DISCONTD: aspirin  325 mg Oral Daily  . DISCONTD: cefUROXime (ZINACEF)  IV  750 mg Intravenous To OR  . DISCONTD: chlorhexidine  60 mL Topical Once  . DISCONTD: cloNIDine  0.2 mg Oral TID  . DISCONTD: DOPamine  2-20 mcg/kg/min Intravenous To OR  . DISCONTD: epinephrine  0.5-20 mcg/min Intravenous To OR  . DISCONTD: febuxostat  80 mg Oral Daily  . DISCONTD: insulin aspart  0-24 Units Subcutaneous Q4H  . DISCONTD: insulin (NOVOLIN-R) infusion   Intravenous To OR  . DISCONTD: magnesium sulfate  40 mEq Other To OR  . DISCONTD: metoprolol tartrate  50 mg Oral Q6H  .  DISCONTD: nitroGLYCERIN  2-200 mcg/min Intravenous To OR  . DISCONTD: omega-3 acid ethyl esters  3 g Oral Daily  . DISCONTD: phenylephrine (NEO-SYNEPHRINE) Adult infusion  30-200 mcg/min Intravenous To OR  . DISCONTD: polyethylene glycol  17 g Oral Daily  . DISCONTD: potassium chloride  80 mEq Other To OR  . DISCONTD: sodium bicarbonate  650 mg Oral TID  . DISCONTD: sodium chloride  3 mL Intravenous Q12H  . DISCONTD: terazosin  10 mg Oral QHS   Dg Chest Portable 1 View  04/01/2011  *RADIOLOGY REPORT*  Clinical Data: Coronary artery disease.  Status post CABG.  PORTABLE CHEST - 1 VIEW  Comparison: 03/27/2011  Findings: The patient has undergone CABG.  Endotracheal tube, Swan- Ganz catheter, bilateral central venous catheters (three), and left chest tube in place.  No pneumothorax.  Heart size and vascularity are normal.  No infiltrates or effusions.  IMPRESSION: Satisfactory  postoperative appearance of the chest with no pneumothorax.  Original Report Authenticated By: Gwynn Burly, M.D.   Dg Fluoro Guide Cv Line-no Report  04/01/2011  CLINICAL DATA: catheter insertion   FLOURO GUIDE CV LINE  Fluoroscopy was utilized by the requesting physician.  No radiographic  interpretation.     BMET  Lab 04/02/11 0400 04/01/11 2129 04/01/11 2121 04/01/11 1421 04/01/11 1319 04/01/11 1230 04/01/11 1132 04/01/11 1046 04/01/11 0430 03/31/11 0500 03/30/11 0630 03/29/11 0630 03/28/11 0505 03/27/11 0600  NA 136 -- 139 139 137 135 135 135 -- -- -- -- -- --  K 4.6 -- 4.7 4.3 4.8 5.5* 5.2* 4.6 -- -- -- -- -- --  CL 107 -- 110 -- -- -- -- -- 104 107 105 105 106 --  CO2 19 -- -- -- -- -- -- -- 23 21 22 19 21 22   GLUCOSE 147* -- 199* 146* 158* 125* 117* 110* -- -- -- -- -- --  BUN 37* -- 33* -- -- -- -- -- 40* 42* 41* 40* 35* --  CREATININE 3.36* 3.11* 3.20* -- -- -- -- -- 3.41* 3.63* 3.46* 3.45* -- --  ALB -- -- -- -- -- -- -- -- -- -- -- -- -- --  CALCIUM 8.2* -- -- -- -- -- -- -- 9.1 9.0 9.1 9.3 9.2 9.2  PHOS -- -- -- -- -- -- -- -- 4.1 3.3 3.6 3.1 -- --   CBC  Lab 04/02/11 0300 04/01/11 2129 04/01/11 2121 04/01/11 1430 04/01/11 1200 04/01/11 0400 03/27/11 0136  WBC 15.1* 18.2* -- 20.0* -- 11.6* --  NEUTROABS -- -- -- -- -- -- 7.6  HGB 9.0* 10.1* 10.5* 9.2* -- -- --  HCT 26.8* 29.8* 31.0* 26.8* -- -- --  MCV 85.9 85.6 -- 85.6 -- 86.7 --  PLT 236 249 -- 184 200 -- --     Assessment/Plan:  1. Chronic kidney disease stage IV: Good urine output noted overnight with potassium concentrations within normal limits, no acute indications noted for hemodialysis at this time. He is a high risk patient given his advanced renal dysfunction and hemodynamic nature of his recent surgery, we'll continue to monitor closely with you. Left IJ tunneled hemodialysis catheter is in situ if needed for urgent/emergent hemodialysis.  2. Coronary artery disease status post non-ST elevation MI and now  status post multivessel CABG: Management per cardiothoracic surgery, currently on inotropic support.  3. Hypertension: Management protocol per postoperative plans by cardiothoracic surgery, currently on nitroprusside drip.  4. Anemia of chronic kidney disease: On aranesp therapy, monitor for postoperative drop  in the need for PRBCs.   Maricel Swartzendruber K.

## 2011-04-02 NOTE — Progress Notes (Signed)
CSW received referral for SNF placement. Completed psychosocial assessment (placed in shadow chart). Will follow for d/c planning as pt progresses.  Baxter Flattery, MSW [phone]

## 2011-04-02 NOTE — Procedures (Signed)
Extubation Procedure Note  Patient Details:   Name: [name] DOB: May 03, 1960 MRN: [id number]   Airway Documentation:   Pt extubated to 50%VM per MD. No stridor noted. BBS equal and dim. Positive cuff leak. NIF -40, VC 600. Pt able to vocalize.  Evaluation  O2 sats: stable throughout and currently acceptable Complications: No apparent complications Patient did tolerate procedure well. Bilateral Breath Sounds: Diminished Suctioning: Airway   Christie Beckers 04/02/2011, 8:59 AM

## 2011-04-02 NOTE — Progress Notes (Signed)
1 Day Post-Op Procedure(s) (LRB): CORONARY ARTERY BYPASS GRAFTING (CABG) (N/A) INSERTION OF DIALYSIS CATHETER (N/A) Subjective: Extubated this AM due to low O2                                            [address]            [place] [address]          [phone]      Neuro intact Urine output 50/hr  Labs ok except hi glucose  Objective: Vital signs in last 24 hours: Temp:  [94.8 F (34.9 C)-100.4 F (38 C)] 99.9 F (37.7 C) (01/16 0900) Pulse Rate:  [73-126] 87  (01/16 0900) Cardiac Rhythm:  [-] Normal sinus rhythm (01/16 0800) Resp:  [0-28] 20  (01/16 0900) BP: (109-195)/(54-84) 147/78 mmHg (01/16 0900) SpO2:  [89 %-100 %] 93 % (01/16 0900) Arterial Line BP: (109-343)/(49-110) 186/79 mmHg (01/16 0900) FiO2 (%):  [39.8 %-100 %] 50 % (01/16 0858) Weight:  [279 lb 8.7 oz (126.8 kg)] 279 lb 8.7 oz (126.8 kg) (01/16 0600)  Hemodynamic parameters for last 24 hours: PAP: (28-80)/(10-31) 48/26 mmHg CO:  [8 L/min-13.2 L/min] 8 L/min CI:  [3.2 L/min/m2-5.5 L/min/m2] 3.2 L/min/m2  Intake/Output from previous day: 01/15 0701 - 01/16 0700 In: 4419 [I.V.:2920; Blood:979; NG/GT:120; IV Piggyback:400] Out: 3975 [Urine:2835; Blood:700; Chest Tube:440] Intake/Output this shift: Total I/O In: 168.7 [I.V.:118.7; IV Piggyback:50] Out: 170 [Urine:100; Chest Tube:70]  Diminished breath sounds on exam  Lab Results:  Basename 04/02/11 0300 04/01/11 2129  WBC 15.1* 18.2*  HGB 9.0* 10.1*  HCT 26.8* 29.8*  PLT 236 249   BMET:  Basename 04/02/11 0400 04/01/11 2129 04/01/11 2121 04/01/11 0430  NA 136 -- 139 --  K 4.6 -- 4.7 --  CL 107 -- 110 --  CO2 19 -- -- 23  GLUCOSE 147* -- 199* --  BUN 37* -- 33* --  CREATININE 3.36* 3.11* -- --  CALCIUM 8.2* -- -- 9.1    PT/INR:  Basename 04/01/11 1430  LABPROT 17.6*  INR 1.42   ABG    Component Value Date/Time   PHART 7.344* 04/02/2011 0824   HCO3 18.4* 04/02/2011 0824   TCO2 19 04/02/2011 0824   ACIDBASEDEF  6.0* 04/02/2011 0824   O2SAT 86.0 04/02/2011 0824   CBG (last 3)   Basename 04/02/11 0756 04/02/11 0646 04/02/11 0539  GLUCAP 105* 102* 110*    Assessment/Plan: S/P Procedure(s) (LRB): CORONARY ARTERY BYPASS GRAFTING (CABG) (N/A) INSERTION OF DIALYSIS CATHETER (N/A) PLAN  BP control OOB diuresis   LOS: 6 days    VAN TRIGT III,Zenab Gronewold 04/02/2011

## 2011-04-03 ENCOUNTER — Inpatient Hospital Stay (HOSPITAL_COMMUNITY): Payer: Medicare Other

## 2011-04-03 ENCOUNTER — Encounter (HOSPITAL_COMMUNITY): Payer: Self-pay | Admitting: Cardiothoracic Surgery

## 2011-04-03 LAB — GLUCOSE, CAPILLARY
Glucose-Capillary: 109 mg/dL — ABNORMAL HIGH (ref 70–99)
Glucose-Capillary: 114 mg/dL — ABNORMAL HIGH (ref 70–99)
Glucose-Capillary: 148 mg/dL — ABNORMAL HIGH (ref 70–99)

## 2011-04-03 LAB — BASIC METABOLIC PANEL
BUN: 44 mg/dL — ABNORMAL HIGH (ref 6–23)
CO2: 23 mEq/L (ref 19–32)
Calcium: 8.8 mg/dL (ref 8.4–10.5)
Chloride: 102 mEq/L (ref 96–112)
Creatinine, Ser: 4.02 mg/dL — ABNORMAL HIGH (ref 0.50–1.35)
GFR calc Af Amer: 19 mL/min — ABNORMAL LOW (ref >90)
GFR calc non Af Amer: 16 mL/min — ABNORMAL LOW (ref >90)
Glucose, Bld: 127 mg/dL — ABNORMAL HIGH (ref 70–99)
Potassium: 4.9 mEq/L (ref 3.5–5.1)
Sodium: 135 mEq/L (ref 135–145)

## 2011-04-03 LAB — CBC
HCT: 26.9 % — ABNORMAL LOW (ref 39.0–52.0)
Hemoglobin: 8.9 g/dL — ABNORMAL LOW (ref 13.0–17.0)
MCH: 29.1 pg (ref 26.0–34.0)
MCHC: 33.1 g/dL (ref 30.0–36.0)
MCV: 87.9 fL (ref 78.0–100.0)
Platelets: 245 10*3/uL (ref 150–400)
RBC: 3.06 MIL/uL — ABNORMAL LOW (ref 4.22–5.81)
RDW: 15.1 % (ref 11.5–15.5)
WBC: 17.2 10*3/uL — ABNORMAL HIGH (ref 4.0–10.5)

## 2011-04-03 MED ORDER — OMEGA-3-ACID ETHYL ESTERS 1 G PO CAPS
1.0000 g | ORAL_CAPSULE | Freq: Three times a day (TID) | ORAL | Status: DC
  Administered 2011-04-03 – 2011-04-05 (×6): 1 g via ORAL
  Filled 2011-04-03 (×9): qty 1

## 2011-04-03 MED FILL — Lactated Ringer's Solution: INTRAVENOUS | Qty: 500 | Status: AC

## 2011-04-03 MED FILL — Verapamil HCl IV Soln 2.5 MG/ML: INTRAVENOUS | Qty: 3.3 | Status: AC

## 2011-04-03 MED FILL — Sodium Bicarbonate IV Soln 8.4%: INTRAVENOUS | Qty: 0.3 | Status: AC

## 2011-04-03 MED FILL — Nitroglycerin IV Soln 5 MG/ML: INTRAVENOUS | Qty: 0.84 | Status: AC

## 2011-04-03 MED FILL — Heparin Sodium (Porcine) Inj 1000 Unit/ML: INTRAMUSCULAR | Qty: 3 | Status: AC

## 2011-04-03 NOTE — Progress Notes (Signed)
Patient ID: [name], male   DOB: 01/06/1961, 52 y.o.   MRN: [id number] BP 141/88  Pulse 69  Temp(Src) 98.5 F (36.9 C) (Oral)  Resp 11  Ht 6\' 1"  (1.854 m)  Wt 272 lb 11.3 oz (123.7 kg)  BMI 35.98 kg/m2  SpO2 96% Foley still on Lasix 80 mg day for diuresis Lab Results  Component Value Date   WBC 17.2* 04/03/2011   HGB 8.9* 04/03/2011   HCT 26.9* 04/03/2011   PLT 245 04/03/2011   GLUCOSE 127* 04/03/2011   CHOL 206* 06/23/2008   TRIG 91.0 06/23/2008   HDL 56.60 06/23/2008   LDLDIRECT 129.4 06/23/2008   ALT 15 03/27/2011   AST 58* 03/27/2011   NA 135 04/03/2011   K 4.9 04/03/2011   CL 102 04/03/2011   CREATININE 4.02* 04/03/2011   BUN 44* 04/03/2011   CO2 23 04/03/2011   TSH 0.506 03/27/2011   PSA 2.06 06/23/2008   INR 1.42 04/01/2011   HGBA1C 5.8* 04/01/2011   Cr stable, urine out almost 2 l today  Delight Ovens MD  Beeper [phone] Office [phone]

## 2011-04-03 NOTE — Progress Notes (Signed)
[facility]  Subjective: No Complaints.  Objective: Vital signs in last 24 hours: Temp:  [97.9 F (36.6 C)-100.2 F (37.9 C)] 98 F (36.7 C) (01/17 0755) Pulse Rate:  [65-106] 73  (01/17 0800) Resp:  [0-29] 15  (01/17 0800) BP: (118-177)/(59-98) 158/70 mmHg (01/17 0800) SpO2:  [91 %-100 %] 100 % (01/17 0800) Arterial Line BP: (171-215)/(73-89) 213/82 mmHg (01/16 1700) FiO2 (%):  [70 %] 70 % (01/16 0959) Weight:  [123.7 kg (272 lb 11.3 oz)] 123.7 kg (272 lb 11.3 oz) (01/17 0500) Last BM Date: 03/29/11  Intake/Output from previous day: 01/16 0701 - 01/17 0700 In: 2274.2 [P.O.:1020; I.V.:1154.2; IV Piggyback:100] Out: 4065 [Urine:3775; Chest Tube:290] Intake/Output this shift: Total I/O In: 140 [P.O.:120; I.V.:20] Out: 100 [Urine:100]  Medications Current Facility-Administered Medications  Medication Dose Route Frequency Provider Last Rate Last Dose  . 0.45 % sodium chloride infusion   Intravenous Continuous Ardelle Balls, PA 20 mL/hr at 04/03/11 0600    . 0.9 %  sodium chloride infusion  250 mL Intravenous PRN Leone Brand, NP      . acetaminophen (TYLENOL) tablet 1,000 mg  1,000 mg Oral Q6H Ardelle Balls, PA   1,000 mg at 04/03/11 0616   Or  . acetaminophen (TYLENOL) solution 975 mg  975 mg Per Tube Q6H Ardelle Balls, PA   975 mg at 04/02/11 0548  . albumin human 5 % solution 250 mL  250 mL Intravenous Q15 min PRN Ardelle Balls, PA      . aliskiren [facility]) tablet 150 mg  150 mg Oral Daily Kathlee Nations Trigt III, MD   150 mg at 04/02/11 1614  . antiseptic oral rinse (BIOTENE) solution 15 mL  15 mL Mouth Rinse BID Runell Gess, MD   15 mL at 04/03/11 0836  . aspirin EC tablet 325 mg  325 mg Oral Daily Ardelle Balls, [place]   325 mg at 04/02/11 1052   Or  . aspirin chewable tablet 324 mg  324 mg Per Tube Daily Donielle Margaretann Loveless, PA      . bisacodyl (DULCOLAX) EC tablet 10 mg  10 mg Oral Daily Ardelle Balls, PA   10 mg at 04/02/11 1102   Or  . bisacodyl (DULCOLAX) suppository 10 mg  10 mg Rectal Daily Donielle Margaretann Loveless, PA      . cefUROXime (ZINACEF) 1.5 g in dextrose 5 % 50 mL IVPB  1.5 g Intravenous Q12H Ardelle Balls, PA   1.5 g at 04/03/11 0834  . cloNIDine (CATAPRES) tablet 0.2 mg  0.2 mg Oral TID Kathlee Nations Trigt III, MD   0.2 mg at 04/02/11 2133  . colchicine tablet 0.6 mg  0.6 mg Oral BID PRN Kathlee Nations Trigt III, MD      . darbepoetin [facility]) injection 100 mcg  100 mcg Subcutaneous Q Mon-1800 Jay K. Allena Katz, MD   100 mcg at 03/31/11 1803  . docusate sodium (COLACE) capsule 200 mg  200 mg Oral Daily Ardelle Balls, PA   200 mg at 04/02/11 1102  . febuxostat (ULORIC) tablet 80 mg  80 mg Oral Daily Donielle Margaretann Loveless, PA      . furosemide (LASIX) injection 80 mg  80 mg Intravenous BID Kathlee Nations Trigt III, MD   80 mg at 04/02/11 1759  . insulin aspart (novoLOG) injection 0-24 Units  0-24 Units Subcutaneous Q4H Kerin Perna III, MD   2 Units at 04/03/11  0055  . insulin glargine (LANTUS) injection 20 Units  20 Units Subcutaneous BID Kerin Perna III, MD   20 Units at 04/02/11 2138  . magnesium sulfate IVPB 4 g 100 mL  4 g Intravenous Once Ardelle Balls, PA      . meperidine (DEMEROL) injection 12.5 mg  12.5 mg Intravenous Q2H PRN Kathlee Nations Trigt III, MD   12.5 mg at 04/01/11 1703  . metoprolol (LOPRESSOR) injection 10 mg  10 mg Intravenous Q2H PRN Kathlee Nations Trigt III, MD   10 mg at 04/02/11 1223  . metoprolol (LOPRESSOR) tablet 50 mg  50 mg Oral BID Kathlee Nations Trigt III, MD   50 mg at 04/02/11 2132  . milrinone (PRIMACOR) infusion 200 mcg/mL (0.2 mg/mL)  0.3 mcg/kg/min Intravenous Continuous Kathlee Nations Trigt III, MD   0.3 mcg/kg/min at 04/02/11 0930  . morphine 4 MG/ML injection 2-5 mg  2-5 mg Intravenous Q1H PRN Gary Fleet Abbott, PHARMD   4 mg at 04/02/11 1530  . nitroGLYCERIN 0.2 mg/mL in dextrose 5 % infusion  0-100 mcg/min Intravenous Continuous  Ardelle Balls, PA   15 mcg/min at 04/03/11 0300  . nitroPRUSSide (NIPRIDE) 50 mg in dextrose 5 % 250 mL infusion  0.25-0.5 mcg/kg/min Intravenous Continuous Kathlee Nations Trigt III, MD   0.028 mcg/kg/min at 04/03/11 0100  . omega-3 acid ethyl esters (LOVAZA) capsule 3 g  3 g Oral Daily Donielle Margaretann Loveless, PA      . ondansetron [facility]) injection 4 mg  4 mg Intravenous Q6H PRN Ardelle Balls, PA      . oxyCODONE (Oxy IR/ROXICODONE) immediate release tablet 5-10 mg  5-10 mg Oral Q3H PRN Ardelle Balls, PA   10 mg at 04/03/11 0840  . pantoprazole (PROTONIX) EC tablet 40 mg  40 mg Oral Q1200 Ardelle Balls, PA      . rosuvastatin (CRESTOR) tablet 20 mg  20 mg Oral q1800 Kerry Hough, PA   20 mg at 04/02/11 1841  . rosuvastatin (CRESTOR) tablet 20 mg  20 mg Oral Daily Kathlee Nations Trigt III, MD   20 mg at 04/02/11 1058  . sodium chloride 0.9 % injection 3 mL  3 mL Intravenous Q12H Donielle Margaretann Loveless, PA      . sodium chloride 0.9 % injection 3 mL  3 mL Intravenous PRN Ardelle Balls, PA      . terazosin (HYTRIN) capsule 10 mg  10 mg Oral QHS Kerin Perna III, MD   10 mg at 04/02/11 2132  . DISCONTD: 0.9 %  sodium chloride infusion   Intravenous Continuous Ardelle Balls, PA      . DISCONTD: 0.9 %  sodium chloride infusion  250 mL Intravenous Continuous Ardelle Balls, PA      . DISCONTD: dexmedetomidine (PRECEDEX) 200 mcg in sodium chloride 0.9 % 50 mL infusion  0.1-0.7 mcg/kg/hr Intravenous Continuous Ardelle Balls, PA 5.9 mL/hr at 04/02/11 0800 0.2 mcg/kg/hr at 04/02/11 0800  . DISCONTD: DOPamine (INTROPIN) 800 mg in dextrose 5 % 250 mL infusion  0-5 mcg/kg/min Intravenous Continuous Ardelle Balls, PA   3 mcg/kg/min at 04/01/11 1415  . DISCONTD: insulin regular (NOVOLIN R,HUMULIN R) 1 Units/mL in sodium chloride 0.9 % 100 mL infusion   Intravenous Continuous Kathlee Nations Trigt III, MD 3.4 mL/hr at 04/02/11 0800    . DISCONTD: insulin regular bolus  via infusion 0-10 Units  0-10 Units Intravenous TID WC Mikey Bussing, MD  0 Units at 04/02/11 1244  . DISCONTD: lactated ringers infusion   Intravenous Continuous Ardelle Balls, PA      . DISCONTD: metoprolol (LOPRESSOR) injection 2.5-5 mg  2.5-5 mg Intravenous Q2H PRN Ardelle Balls, PA   5 mg at 04/02/11 0853  . DISCONTD: metoprolol (LOPRESSOR) injection 5 mg  5 mg Intravenous Q2H PRN Kathlee Nations Trigt III, MD      . DISCONTD: metoprolol tartrate (LOPRESSOR) 25 mg/10 mL oral suspension 12.5 mg  12.5 mg Per Tube BID Ardelle Balls, PA   12.5 mg at 04/01/11 1953  . DISCONTD: metoprolol tartrate (LOPRESSOR) 25 mg/10 mL oral suspension 12.5 mg  12.5 mg Per Tube BID Kathlee Nations Trigt III, MD      . DISCONTD: metoprolol tartrate (LOPRESSOR) tablet 12.5 mg  12.5 mg Oral BID Ardelle Balls, PA      . DISCONTD: midazolam (VERSED) injection 2 mg  2 mg Intravenous Q1H PRN Ardelle Balls, PA   2 mg at 04/02/11 0311  . DISCONTD: milrinone (PRIMACOR) infusion 200 mcg/mL (0.2 mg/mL)  0.3 mcg/kg/min Intravenous Continuous Ardelle Balls, PA 10.6 mL/hr at 04/02/11 0600 0.3 mcg/kg/min at 04/02/11 0600  . DISCONTD: phenylephrine (NEO-SYNEPHRINE) 20,000 mcg in dextrose 5 % 250 mL infusion  0-100 mcg/min Intravenous Continuous Ardelle Balls, PA        PE: General appearance: alert, cooperative and no distress Lungs: clear to auscultation bilaterally Heart: regular rate and rhythm, S1, S2 normal, no murmur, click, rub or gallop Extremities: Trace LEE Pulses: 2+ right DP, 1+ left DP  Lab Results:   Basename 04/03/11 0445 04/02/11 1630 04/02/11 1621 04/02/11 0300  WBC 17.2* -- 17.8* 15.1*  HGB 8.9* 9.9* 8.9* --  HCT 26.9* 29.0* 26.4* --  PLT 245 -- 219 236   BMET  Basename 04/03/11 0445 04/02/11 1630 04/02/11 1621 04/02/11 0400 04/01/11 0430  NA 135 136 -- 136 --  K 4.9 4.1 -- 4.6 --  CL 102 110 -- 107 --  CO2 23 -- -- 19 23  GLUCOSE 127* 95 -- 147* --  BUN  44* 35* -- 37* --  CREATININE 4.02* 3.80* 3.81* -- --  CALCIUM 8.8 -- -- 8.2* 9.1   PT/INR  Basename 04/01/11 1430  LABPROT 17.6*  INR 1.42    Studies/Results: PORTABLE CHEST - 1 VIEW , 04/03/11 Comparison: 04/02/2011  Findings: Interval extubation and removal of nasogastric tube.  The left-sided subclavian line terminates at the cavoatrial  junction. A right IJ line terminates at the mid SVC. The Theone Murdoch catheter has been removed.  Mediastinal drain remains in place. Left chest tube has been  removed. No pneumothorax.  Cardiomegaly accentuated by AP portable technique. Small layering  left pleural effusion. Low lung volumes. Improved right  infrahilar aeration. Persistent left base atelectasis.  IMPRESSION:  1. Removal of support apparatus, without pneumothorax.  2. Slight improvement in right-sided aeration. Persistent low  lung volumes with patchy left sided atelectasis.    Assessment/Plan  Principal Problem:  *Coronary artery bypass grafting x4 (left internal mammary artery to  left anterior descending , saphenous vein graft to posterolateral  branch of right coronary, sequential saphenous vein graft to obtuse  marginal 1 and obtuse marginal 2).  Active Problems: ABLA-   NSTEMI (non-ST elevated myocardial infarction) pk Troponin 22.9  HYPERLIPIDEMIA  HYPERTENSION  RENAL INSUFFICIENCY, Cr 3.7  CEREBROVASCULAR ACCIDENT, HX OF CVA X 4  Gout  CAD, 3 vessel significant disease at cath this  admission. for CABG this admission  Plan: POD #2.  HGB stable.  WBC's elevated with slight improvement.   BP elevated.  Diuresed 1.79 Liters yesterday on Lasix 80mg  IV BID. Continue to monitor.     LOS: 7 days    HAGER,BRYAN W 04/03/2011 9:07 AM   Agree with note written by Jones Skene PAC  Post Op CABG. Renal fxn stable. Needs more aggressive diuresis.  Runell Gess 04/04/2011 8:24 AM

## 2011-04-03 NOTE — Progress Notes (Signed)
Patient ID: NAS WAFER, male   DOB: 1960/12/27, 51 y.o.   MRN: [id number]  S: Extubated yesterday and weaned off nitroprusside drip. Currently he reports some chest pain over the surgical site and states had "rough night".  O:BP 158/70  Pulse 73  Temp(Src) 98 F (36.7 C) (Oral)  Resp 15  Ht 6\' 1"  (1.854 m)  Wt 123.7 kg (272 lb 11.3 oz)  BMI 35.98 kg/m2  SpO2 100%  Intake/Output Summary (Last 24 hours) at 04/03/11 0851 Last data filed at 04/03/11 0800  Gross per 24 hour  Intake 2305.52 ml  Output   4045 ml  Net -1739.48 ml   Weight change: -3.1 kg (-6 lb 13.4 oz) Gen: Comfortable, resting up in recliner CVS: Pulse regular in rate and rhythm, distant S1 and S2 heart sounds Resp: Diminished breath sounds bibasally otherwise clear Abd: Soft, obese, mild epigastric tenderness. Ext: Trace to 1+ edema over both ankles.      Marland Kitchen acetaminophen  1,000 mg Oral Q6H   Or  . acetaminophen (TYLENOL) oral liquid 160 mg/5 mL  975 mg Per Tube Q6H  . aliskiren  150 mg Oral Daily  . antiseptic oral rinse  15 mL Mouth Rinse BID  . aspirin EC  325 mg Oral Daily   Or  . aspirin  324 mg Per Tube Daily  . bisacodyl  10 mg Oral Daily   Or  . bisacodyl  10 mg Rectal Daily  . cefUROXime (ZINACEF)  IV  1.5 g Intravenous Q12H  . cloNIDine  0.2 mg Oral TID  . darbepoetin (ARANESP) injection - NON-DIALYSIS  100 mcg Subcutaneous Q Mon-1800  . docusate sodium  200 mg Oral Daily  . febuxostat  80 mg Oral Daily  . furosemide  80 mg Intravenous BID  . insulin aspart  0-24 Units Subcutaneous Q4H  . insulin glargine  20 Units Subcutaneous BID  . magnesium sulfate  4 g Intravenous Once  . metoprolol tartrate  50 mg Oral BID  . omega-3 acid ethyl esters  3 g Oral Daily  . pantoprazole  40 mg Oral Q1200  . rosuvastatin  20 mg Oral q1800  . rosuvastatin  20 mg Oral Daily  . sodium chloride  3 mL Intravenous Q12H  . terazosin  10 mg Oral QHS  . DISCONTD: insulin regular  0-10 Units Intravenous TID WC    . DISCONTD: metoprolol tartrate  12.5 mg Per Tube BID  . DISCONTD: metoprolol tartrate  12.5 mg Per Tube BID  . DISCONTD: metoprolol tartrate  12.5 mg Oral BID   Dg Chest Portable 1 View In Am  04/03/2011  *RADIOLOGY REPORT*  Clinical Data: Postop for cardiac surgery.  PORTABLE CHEST - 1 VIEW  Comparison: 04/02/2011  Findings:   Interval extubation and removal of nasogastric tube. The left-sided subclavian line terminates at the cavoatrial junction.  A right IJ line terminates at the mid SVC.  The Theone Murdoch catheter has been removed.  Mediastinal drain remains in place.  Left chest tube has been removed. No pneumothorax.  Cardiomegaly accentuated by AP portable technique.  Small layering left pleural effusion.  Low lung volumes.  Improved right infrahilar aeration.  Persistent left base atelectasis.  IMPRESSION:  1.  Removal of support apparatus, without pneumothorax. 2.  Slight improvement in right-sided aeration.  Persistent low lung volumes with patchy left sided atelectasis.  Original Report Authenticated By: Consuello Bossier, M.D.   Dg Chest Portable 1 View In Am  04/02/2011  *RADIOLOGY  REPORT*  Clinical Data: Post open heart surgery  PORTABLE CHEST - 1 VIEW  Comparison: Portable exam 0641 hours compared to 04/01/2011  Findings: Tip of endotracheal tube 4.9 cm above carina. Nasogastric tube extends into stomach. Left thoracostomy tube. Left jugular central venous catheter, tip high right atrium. Right jugular central venous catheter tip projecting over SVC near cavoatrial junction. Right jugular Swan-Ganz catheter also present, tip at main pulmonary artery bifurcation. Enlargement of cardiac silhouette post CABG. Low lung volumes with bibasilar atelectasis. No gross pleural effusion or pneumothorax.  IMPRESSION: Low lung volumes with bibasilar atelectasis.  Original Report Authenticated By: Lollie Marrow, M.D.   Dg Chest Portable 1 View  04/01/2011  *RADIOLOGY REPORT*  Clinical Data: Coronary artery  disease.  Status post CABG.  PORTABLE CHEST - 1 VIEW  Comparison: 03/27/2011  Findings: The patient has undergone CABG.  Endotracheal tube, Swan- Ganz catheter, bilateral central venous catheters (three), and left chest tube in place.  No pneumothorax.  Heart size and vascularity are normal.  No infiltrates or effusions.  IMPRESSION: Satisfactory postoperative appearance of the chest with no pneumothorax.  Original Report Authenticated By: Gwynn Burly, M.D.   BMET  Lab 04/03/11 0445 04/02/11 1630 04/02/11 1621 04/02/11 0400 04/01/11 2129 04/01/11 2121 04/01/11 1421 04/01/11 1319 04/01/11 1230 04/01/11 0430 03/31/11 0500 03/30/11 0630 03/29/11 0630 03/28/11 0505  NA 135 136 -- 136 -- 139 139 137 135 -- -- -- -- --  K 4.9 4.1 -- 4.6 -- 4.7 4.3 4.8 5.5* -- -- -- -- --  CL 102 110 -- 107 -- 110 -- -- -- 104 107 105 -- --  CO2 23 -- -- 19 -- -- -- -- -- 23 21 22 19 21   GLUCOSE 127* 95 -- 147* -- 199* 146* 158* 125* -- -- -- -- --  BUN 44* 35* -- 37* -- 33* -- -- -- 40* 42* 41* -- --  CREATININE 4.02* 3.80* 3.81* 3.36* 3.11* 3.20* -- -- -- 3.41* -- -- -- --  ALB -- -- -- -- -- -- -- -- -- -- -- -- -- --  CALCIUM 8.8 -- -- 8.2* -- -- -- -- -- 9.1 9.0 9.1 9.3 9.2  PHOS -- -- -- -- -- -- -- -- -- 4.1 3.3 3.6 3.1 --   CBC  Lab 04/03/11 0445 04/02/11 1630 04/02/11 1621 04/02/11 0300 04/01/11 2129  WBC 17.2* -- 17.8* 15.1* 18.2*  NEUTROABS -- -- -- -- --  HGB 8.9* 9.9* 8.9* 9.0* --  HCT 26.9* 29.0* 26.4* 26.8* --  MCV 87.9 -- 86.0 85.9 85.6  PLT 245 -- 219 236 249     Assessment/Plan:  1. Chronic kidney disease stage IV: Good urine output noted overnight with potassium concentrations within normal limits, no acute indications noted for hemodialysis at this time in spite of a mild creatinine rise. We'll continue to monitor him closely as he is a high risk patient given his advanced renal dysfunction and hemodynamic nature of his recent surgery. Left IJ tunneled hemodialysis catheter is in situ  if needed for urgent/emergent hemodialysis.  2. Coronary artery disease status post non-ST elevation MI and now status post multivessel CABG: Management per cardiothoracic surgery, off inotropes and now on oral medications-hemodynamically stable.  3. Hypertension: Weaned off nitroprusside drip currently with acceptable blood pressures on oral agents.  4. Anemia of chronic kidney disease: On aranesp therapy, monitor for postoperative drop in the need for PRBCs.   Chasten Blaze K.

## 2011-04-03 NOTE — Progress Notes (Signed)
UR Completed.  Jong Rickman Jane [phone] 04/03/2011  

## 2011-04-03 NOTE — Progress Notes (Signed)
Physical Therapy Evaluation Patient Details Name: [name] MRN: [id number] DOB: 07-Jun-1960 Today's Date: 04/03/2011  Problem List:  Patient Active Problem List  Diagnoses  . HYPERLIPIDEMIA  . ANEMIA-NOS  . DEPRESSION  . HYPERTENSION  . PERIPHERAL VASCULAR DISEASE  . HEMORRHOIDS  . ALLERGIC RHINITIS  . ACHALASIA  . GERD  . RENAL INSUFFICIENCY, Cr 3.7  . DEGENERATIVE JOINT DISEASE, SHOULDER  . HEADACHE  . CEREBROVASCULAR ACCIDENT, HX OF CVA X 4  . RENAL CALCULUS, HX OF  . NSTEMI (non-ST elevated myocardial infarction) pk Troponin 22.9  . Gout  . CAD, 3 vessel significant disease at cath this admission. for CABG this admission    Past Medical History:  Past Medical History  Diagnosis Date  . ACHALASIA   . ANEMIA-NOS   . CEREBROVASCULAR ACCIDENT, HX OF   . DEGENERATIVE JOINT DISEASE, SHOULDER   . DEPRESSION   . GERD   . Headache   . HYPERLIPIDEMIA   . HYPERTENSION   . RENAL CALCULUS, HX OF   . RENAL INSUFFICIENCY   . Stroke   . Shortness of breath   . Sleep apnea   . H/O hiatal hernia   . Seizures   . CAD (coronary artery disease), 3 vessel significant disease.  03/28/2011   Past Surgical History:  Past Surgical History  Procedure Date  . Tonsillectomy   . Stomach surgery 2009    achalasia  . Knee surgery 01/07/2011    right    PT Assessment/Plan/Recommendation PT Assessment Clinical Impression Statement: Pt s/p CABG. Pt very pleasant and eager to return to indepent with all activities. Pt educated for sternal precautions with transfers and plan. Will follow to maximize mobiliy and gait before transfer to next venue of care. PT Recommendation/Assessment: Patient will need skilled PT in the acute care venue PT Problem List: Decreased activity tolerance;Decreased mobility;Decreased knowledge of precautions;Decreased knowledge of use of DME Barriers to Discharge: Decreased caregiver support Barriers to Discharge Comments: no 24 hr assist available PT  Therapy Diagnosis : Difficulty walking;Abnormality of gait PT Plan PT Frequency: Min 3X/week PT Treatment/Interventions: Gait training;DME instruction;Functional mobility training;Therapeutic activities;Therapeutic exercise;Patient/family education PT Recommendation Recommendations for Other Services: OT consult Follow Up Recommendations: Skilled nursing facility Equipment Recommended: Defer to next venue PT Goals  Acute Rehab PT Goals PT Goal Formulation: With patient Time For Goal Achievement: 2 weeks Pt will Roll Supine to Right Side: with modified independence PT Goal: Rolling Supine to Right Side - Progress: Goal set today Pt will go Supine/Side to Sit: with supervision PT Goal: Supine/Side to Sit - Progress: Goal set today Pt will go Sit to Supine/Side: with supervision PT Goal: Sit to Supine/Side - Progress: Goal set today Pt will go Sit to Stand: with supervision PT Goal: Sit to Stand - Progress: Goal set today Pt will go Stand to Sit: with supervision PT Goal: Stand to Sit - Progress: Goal set today Pt will Ambulate: >150 feet;with modified independence;with least restrictive assistive device PT Goal: Ambulate - Progress: Goal set today Additional Goals Additional Goal #1: Pt will independently state and adhere to 3/3 sternal precautions PT Goal: Additional Goal #1 - Progress: Goal set today  PT Evaluation Precautions/Restrictions  Precautions Precautions: Sternal Prior Functioning  Home Living Lives With: Friend(s) Type of Home: Apartment Home Layout: One level Home Access: Level entry Bathroom Shower/Tub: Health visitor: Standard Home Adaptive Equipment: Environmental consultant - standard;Straight cane Prior Function Level of Independence: Independent with basic ADLs;Independent with transfers;Independent with homemaking with ambulation;Independent  with gait (pt states he was amb 3 miles and uses cane at times) Driving: Yes Vocation: On disability Comments: In  [place] to take care of his mother, normally lives in [place] Cognition Cognition Arousal/Alertness: Awake/alert Overall Cognitive Status: Appears within functional limits for tasks assessed Orientation Level: Oriented X4 Sensation/Coordination Sensation Light Touch: Appears Intact Extremity Assessment   Mobility (including Balance) Bed Mobility Bed Mobility: Yes Rolling Right: 4: Min assist Rolling Right Details (indicate cue type and reason): cueing to maintain precautions and assist to fully rotate trunk Right Sidelying to Sit: 3: Mod assist;HOB elevated (comment degrees) (20 degrees) Right Sidelying to Sit Details (indicate cue type and reason): Cueing to sequence  Sitting - Scoot to Edge of Bed: 4: Min assist Sitting - Scoot to [employer] of Bed Details (indicate cue type and reason): assist with pad Transfers Sit to Stand: 4: Min assist;From bed Sit to Stand Details (indicate cue type and reason): cueing for hand placement and anterior translation Stand to Sit: 4: Min assist;To chair/3-in-1 Ambulation/Gait Ambulation/Gait: Yes Ambulation/Gait Assistance: 5: Supervision Ambulation/Gait Assistance Details (indicate cue type and reason): cueing to step into RW Ambulation Distance (Feet): 300 Feet Assistive device: Rolling walker Gait Pattern: Decreased stride length Stairs: No  Posture/Postural Control Posture/Postural Control: No significant limitations Exercise    End of Session PT - End of Session Equipment Utilized During Treatment: Gait belt Activity Tolerance: Patient tolerated treatment well Patient left: in chair;with call bell in reach Nurse Communication: Mobility status for transfers;Mobility status for ambulation General Behavior During Session: [facility] for tasks performed Cognition: [facility] for tasks performed  Delorse Lek 04/03/2011, 11:57 AM Toney Sang, PT [phone]

## 2011-04-03 NOTE — Progress Notes (Signed)
2 Days Post-Op Procedure(s) (LRB): CORONARY ARTERY BYPASS GRAFTING (CABG) (N/A) INSERTION OF DIALYSIS CATHETER (N/A) Subjective:                                                                                                                        [address]            [place] [address]          [phone]    oob to chair BP better control, NSR CXR with better aeration   Objective Vital signs in last 24 hours: Temp:  [97.9 F (36.6 C)-100.2 F (37.9 C)] 98 F (36.7 C) (01/17 0755) Pulse Rate:  [65-106] 73  (01/17 0800) Cardiac Rhythm:  [-] Normal sinus rhythm (01/17 0800) Resp:  [0-29] 15  (01/17 0800) BP: (118-177)/(59-98) 158/70 mmHg (01/17 0800) SpO2:  [91 %-100 %] 100 % (01/17 0800) Arterial Line BP: (171-215)/(73-89) 213/82 mmHg (01/16 1700) FiO2 (%):  [70 %] 70 % (01/16 0959) Weight:  [272 lb 11.3 oz (123.7 kg)] 272 lb 11.3 oz (123.7 kg) (01/17 0500)  Hemodynamic parameters for last 24 hours: PAP: (30-49)/(13-27) 45/20 mmHg  Intake/Output from previous day: 01/16 0701 - 01/17 0700 In: 2274.2 [P.O.:1020; I.V.:1154.2; IV Piggyback:100] Out: 4065 [Urine:3775; Chest Tube:290] Intake/Output this shift: Total I/O In: 140 [P.O.:120; I.V.:20] Out: 100 [Urine:100]  Lungs clear Neuro intact exrem warm  Lab Results:  Basename 04/03/11 0445 04/02/11 1630 04/02/11 1621  WBC 17.2* -- 17.8*  HGB 8.9* 9.9* --  HCT 26.9* 29.0* --  PLT 245 -- 219   BMET:  Basename 04/03/11 0445 04/02/11 1630 04/02/11 0400  NA 135 136 --  K 4.9 4.1 --  CL 102 110 --  CO2 23 -- 19  GLUCOSE 127* 95 --  BUN 44* 35* --  CREATININE 4.02* 3.80* --  CALCIUM 8.8 -- 8.2*    PT/INR:  Basename 04/01/11 1430  LABPROT 17.6*  INR 1.42   ABG    Component Value Date/Time   PHART 7.392 04/02/2011 1624   HCO3 20.6 04/02/2011 1624   TCO2 21 04/02/2011 1630   ACIDBASEDEF 4.0* 04/02/2011 1624   O2SAT 92.0 04/02/2011 1624   CBG (last 3)   Basename 04/03/11 0754 04/03/11  0353 04/02/11 2344  GLUCAP 114* 109* 148*    Assessment/Plan: S/P Procedure(s) (LRB): CORONARY ARTERY BYPASS GRAFTING (CABG) (N/A) INSERTION OF DIALYSIS CATHETER (N/A) Follow creat , so far U/O adequate w/ lasix BID   LOS: 7 days    VAN TRIGT III,Adri Schloss 04/03/2011

## 2011-04-04 ENCOUNTER — Inpatient Hospital Stay (HOSPITAL_COMMUNITY): Payer: Medicare Other

## 2011-04-04 LAB — CBC
HCT: 25.6 % — ABNORMAL LOW (ref 39.0–52.0)
Hemoglobin: 8.3 g/dL — ABNORMAL LOW (ref 13.0–17.0)
MCH: 28.7 pg (ref 26.0–34.0)
MCHC: 32.4 g/dL (ref 30.0–36.0)
MCV: 88.6 fL (ref 78.0–100.0)
Platelets: 259 10*3/uL (ref 150–400)
RBC: 2.89 MIL/uL — ABNORMAL LOW (ref 4.22–5.81)
RDW: 14.8 % (ref 11.5–15.5)
WBC: 15.5 10*3/uL — ABNORMAL HIGH (ref 4.0–10.5)

## 2011-04-04 LAB — BASIC METABOLIC PANEL
BUN: 52 mg/dL — ABNORMAL HIGH (ref 6–23)
CO2: 25 mEq/L (ref 19–32)
Calcium: 9 mg/dL (ref 8.4–10.5)
Chloride: 99 mEq/L (ref 96–112)
Creatinine, Ser: 4.26 mg/dL — ABNORMAL HIGH (ref 0.50–1.35)
GFR calc Af Amer: 17 mL/min — ABNORMAL LOW (ref >90)
GFR calc non Af Amer: 15 mL/min — ABNORMAL LOW (ref >90)
Glucose, Bld: 102 mg/dL — ABNORMAL HIGH (ref 70–99)
Potassium: 4.3 mEq/L (ref 3.5–5.1)
Sodium: 133 mEq/L — ABNORMAL LOW (ref 135–145)

## 2011-04-04 LAB — POCT I-STAT 3, ART BLOOD GAS (G3+)
Bicarbonate: 13.7 mEq/L — ABNORMAL LOW (ref 20.0–24.0)
TCO2: 15 mmol/L (ref 0–100)
pCO2 arterial: 29.4 mmHg — ABNORMAL LOW (ref 35.0–45.0)
pH, Arterial: 7.279 — ABNORMAL LOW (ref 7.350–7.450)
pO2, Arterial: 101 mmHg — ABNORMAL HIGH (ref 80.0–100.0)

## 2011-04-04 LAB — GLUCOSE, CAPILLARY
Glucose-Capillary: 105 mg/dL — ABNORMAL HIGH (ref 70–99)
Glucose-Capillary: 162 mg/dL — ABNORMAL HIGH (ref 70–99)
Glucose-Capillary: 105 mg/dL — ABNORMAL HIGH (ref 70–99)
Glucose-Capillary: 134 mg/dL — ABNORMAL HIGH (ref 70–99)

## 2011-04-04 MED ORDER — CLONIDINE HCL 0.2 MG PO TABS
0.2000 mg | ORAL_TABLET | Freq: Two times a day (BID) | ORAL | Status: DC
  Administered 2011-04-04 – 2011-04-06 (×5): 0.2 mg via ORAL
  Filled 2011-04-04 (×7): qty 1

## 2011-04-04 MED ORDER — POLYSACCHARIDE IRON 150 MG PO CAPS
150.0000 mg | ORAL_CAPSULE | Freq: Every day | ORAL | Status: DC
  Administered 2011-04-05 – 2011-04-08 (×4): 150 mg via ORAL
  Filled 2011-04-04 (×4): qty 1

## 2011-04-04 MED ORDER — FUROSEMIDE 80 MG PO TABS
80.0000 mg | ORAL_TABLET | Freq: Every day | ORAL | Status: DC
  Administered 2011-04-05: 80 mg via ORAL
  Filled 2011-04-04: qty 1

## 2011-04-04 NOTE — Progress Notes (Signed)
3 Days Post-Op Procedure(s) (LRB): CORONARY ARTERY BYPASS GRAFTING (CABG) (N/A) INSERTION OF DIALYSIS CATHETER (N/A)                                                   [address]            [place] [address]          [phone]      Subjective:walking in hall,stonger  Objective: Adequate urine output but creat slowly increased to 4.5 (baseline)  Wt 3 lbs over baseline  Vital signs in last 24 hours: Temp:  [97.9 F (36.6 C)-98.5 F (36.9 C)] 98.4 F (36.9 C) (01/18 1926) Pulse Rate:  [59-98] 65  (01/18 2000) Cardiac Rhythm:  [-] Normal sinus rhythm (01/18 2000) Resp:  [8-20] 16  (01/18 2000) BP: (96-142)/(57-84) 119/63 mmHg (01/18 2000) SpO2:  [92 %-100 %] 98 % (01/18 2000) Weight:  [263 lb 10.7 oz (119.6 kg)] 263 lb 10.7 oz (119.6 kg) (01/18 0600)  Hemodynamic parameters for last 24 hours:  NSR BP well controlled  Intake/Output from previous day: 01/17 0701 - 01/18 0700 In: 1722 [P.O.:1200; I.V.:460; IV Piggyback:62] Out: 5975 [Urine:5975] Intake/Output this shift: Total I/O In: 60 [P.O.:60] Out: -   Incision clean  Lungs clear  Lab Results:  Basename 04/04/11 0411 04/03/11 0445  WBC 15.5* 17.2*  HGB 8.3* 8.9*  HCT 25.6* 26.9*  PLT 259 245   BMET:  Basename 04/04/11 0411 04/03/11 0445  NA 133* 135  K 4.3 4.9  CL 99 102  CO2 25 23  GLUCOSE 102* 127*  BUN 52* 44*  CREATININE 4.26* 4.02*  CALCIUM 9.0 8.8    PT/INR: No results found for this basename: LABPROT,INR in the last 72 hours ABG    Component Value Date/Time   PHART 7.279* 04/04/2011 0420   HCO3 13.7* 04/04/2011 0420   TCO2 15 04/04/2011 0420   ACIDBASEDEF 11.0* 04/04/2011 0420   O2SAT 97.0 04/04/2011 0420   CBG (last 3)   Basename 04/04/11 1929 04/04/11 1607 04/04/11 1153  GLUCAP 134* 105* 162*    Assessment/Plan: S/P Procedure(s) (LRB): CORONARY ARTERY BYPASS GRAFTING (CABG) (N/A) INSERTION OF DIALYSIS CATHETER (N/A)     Will start po iron,decrease lasix to once  daily   LOS: 8 days    VAN TRIGT III,Jeffrie Stander 04/04/2011

## 2011-04-04 NOTE — Progress Notes (Signed)
Patient ID: [name], male   DOB: 01-26-1961, 51 y.o.   MRN: [id number]  S: The patient reports improvement in his chest pain overnight and now rested more comfortably. Continues to have good urine output and without any acute events overnight.  O:BP 142/80  Pulse 64  Temp(Src) 97.9 F (36.6 C) (Oral)  Resp 8  Ht 6\' 1"  (1.854 m)  Wt 119.6 kg (263 lb 10.7 oz)  BMI 34.79 kg/m2  SpO2 98%  Intake/Output Summary (Last 24 hours) at 04/04/11 0818 Last data filed at 04/04/11 0600  Gross per 24 hour  Intake   1582 ml  Output   5875 ml  Net  -4293 ml   Weight change: -4.1 kg (-9 lb 0.6 oz) Gen: Comfortably resting in the recliner CVS: Pulse regular in rate and rhythm heart sounds S1 and S2 are distant Resp: Clear to auscultation bilaterally, suboptimal inspiratory effort Abd: Soft, obese, bowel sounds normal Ext: Trace edema over ankles      . acetaminophen  1,000 mg Oral Q6H   Or  . acetaminophen (TYLENOL) oral liquid 160 mg/5 mL  975 mg Per Tube Q6H  . aliskiren  150 mg Oral Daily  . antiseptic oral rinse  15 mL Mouth Rinse BID  . aspirin EC  325 mg Oral Daily   Or  . aspirin  324 mg Per Tube Daily  . bisacodyl  10 mg Oral Daily   Or  . bisacodyl  10 mg Rectal Daily  . cefUROXime (ZINACEF)  IV  1.5 g Intravenous Q12H  . cloNIDine  0.2 mg Oral TID  . darbepoetin (ARANESP) injection - NON-DIALYSIS  100 mcg Subcutaneous Q Mon-1800  . docusate sodium  200 mg Oral Daily  . febuxostat  80 mg Oral Daily  . furosemide  80 mg Intravenous BID  . insulin aspart  0-24 Units Subcutaneous Q4H  . insulin glargine  20 Units Subcutaneous BID  . magnesium sulfate  4 g Intravenous Once  . metoprolol tartrate  50 mg Oral BID  . omega-3 acid ethyl esters  1 g Oral TID AC  . pantoprazole  40 mg Oral Q1200  . rosuvastatin  20 mg Oral q1800  . rosuvastatin  20 mg Oral Daily  . sodium chloride  3 mL Intravenous Q12H  . terazosin  10 mg Oral QHS  . DISCONTD: insulin regular  0-10 Units  Intravenous TID WC  . DISCONTD: omega-3 acid ethyl esters  3 g Oral Daily   Dg Chest Portable 1 View In Am  04/03/2011  *RADIOLOGY REPORT*  Clinical Data: Postop for cardiac surgery.  PORTABLE CHEST - 1 VIEW  Comparison: 04/02/2011  Findings:   Interval extubation and removal of nasogastric tube. The left-sided subclavian line terminates at the cavoatrial junction.  A right IJ line terminates at the mid SVC.  The Theone Murdoch catheter has been removed.  Mediastinal drain remains in place.  Left chest tube has been removed. No pneumothorax.  Cardiomegaly accentuated by AP portable technique.  Small layering left pleural effusion.  Low lung volumes.  Improved right infrahilar aeration.  Persistent left base atelectasis.  IMPRESSION:  1.  Removal of support apparatus, without pneumothorax. 2.  Slight improvement in right-sided aeration.  Persistent low lung volumes with patchy left sided atelectasis.  Original Report Authenticated By: Consuello Bossier, M.D.   [facility]  Lab 04/04/11 0411 04/03/11 0445 04/02/11 1630 04/02/11 1621 04/02/11 0400 04/01/11 2129 04/01/11 2121 04/01/11 1421 04/01/11 1319 04/01/11 0430 03/31/11 0500  03/30/11 0630 03/29/11 0630  NA 133* 135 136 -- 136 -- 139 139 137 -- -- -- --  K 4.3 4.9 4.1 -- 4.6 -- 4.7 4.3 4.8 -- -- -- --  CL 99 102 110 -- 107 -- 110 -- -- 104 107 -- --  CO2 25 23 -- -- 19 -- -- -- -- 23 21 22 19   GLUCOSE 102* 127* 95 -- 147* -- 199* 146* 158* -- -- -- --  BUN 52* 44* 35* -- 37* -- 33* -- -- 40* 42* -- --  CREATININE 4.26* 4.02* 3.80* 3.81* 3.36* 3.11* 3.20* -- -- -- -- -- --  ALB -- -- -- -- -- -- -- -- -- -- -- -- --  CALCIUM 9.0 8.8 -- -- 8.2* -- -- -- -- 9.1 9.0 9.1 9.3  PHOS -- -- -- -- -- -- -- -- -- 4.1 3.3 3.6 3.1   CBC  Lab 04/04/11 0411 04/03/11 0445 04/02/11 1630 04/02/11 1621 04/02/11 0300  WBC 15.5* 17.2* -- 17.8* 15.1*  NEUTROABS -- -- -- -- --  HGB 8.3* 8.9* 9.9* 8.9* --  HCT 25.6* 26.9* 29.0* 26.4* --  MCV 88.6 87.9 -- 86.0 85.9  PLT 259  245 -- 219 236     Assessment/Plan:  1. Chronic kidney disease stage IV: Good urine output noted overnight with ongoing furosemide therapy, no acute indications noted for hemodialysis at this time in spite of a mild creatinine rise (question effect of tekturna). We'll continue to monitor him closely as he is a high risk patient given his advanced renal dysfunction and hemodynamic nature of his recent surgery. Left IJ tunneled hemodialysis catheter is in situ if needed for urgent/emergent hemodialysis.  2. Coronary artery disease status post non-ST elevation MI and now status post multivessel CABG: Management per cardiothoracic surgery, off inotropes and now on oral medications-hemodynamically stable.  3. Hypertension: On metoprolol, furosemide, tekturna, clonidine and Hytrin-appears to be fairly controlled with occasional elevations.  4. Anemia of chronic kidney disease: On aranesp therapy, monitor for postoperative drop in the need for PRBCs.   Myeesha Shane K.

## 2011-04-04 NOTE — Progress Notes (Signed)
[facility]  Subjective: Patient sitting in recliner eating breakfast.  Has not ambulated yet.  Denies CP and SOB.  Denies major complaints.    Objective: Vital signs in last 24 hours: Temp:  [97.9 F (36.6 C)-98.8 F (37.1 C)] 97.9 F (36.6 C) (01/18 0808) Pulse Rate:  [59-90] 64  (01/18 0700) Resp:  [8-20] 8  (01/18 0700) BP: (96-161)/(57-96) 142/80 mmHg (01/18 0700) SpO2:  [92 %-100 %] 98 % (01/18 0700) Weight:  [119.6 kg (263 lb 10.7 oz)] 119.6 kg (263 lb 10.7 oz) (01/18 0600) Last BM Date: 03/29/11  Intake/Output from previous day: 01/17 0701 - 01/18 0700 In: 1722 [P.O.:1200; I.V.:460; IV Piggyback:62] Out: 5975 [Urine:5975] Intake/Output this shift: Total I/O In: 140 [P.O.:120; I.V.:20] Out: 150 [Urine:150]  Medications Current Facility-Administered Medications  Medication Dose Route Frequency Provider Last Rate Last Dose  . 0.45 % sodium chloride infusion   Intravenous Continuous Ardelle Balls, PA 20 mL/hr at 04/04/11 0600    . 0.9 %  sodium chloride infusion  250 mL Intravenous PRN Leone Brand, NP      . acetaminophen (TYLENOL) tablet 1,000 mg  1,000 mg Oral Q6H Ardelle Balls, PA   1,000 mg at 04/04/11 0650   Or  . acetaminophen (TYLENOL) solution 975 mg  975 mg Per Tube Q6H Ardelle Balls, PA   975 mg at 04/02/11 0548  . aliskiren (TEKTURNA) tablet 150 mg  150 mg Oral Daily Kathlee Nations Trigt III, MD   150 mg at 04/03/11 1026  . antiseptic oral rinse (BIOTENE) solution 15 mL  15 mL Mouth Rinse BID Runell Gess, MD   15 mL at 04/04/11 0800  . aspirin EC tablet 325 mg  325 mg Oral Daily Ardelle Balls, PA   325 mg at 04/03/11 1026   Or  . aspirin chewable tablet 324 mg  324 mg Per Tube Daily Donielle Margaretann Loveless, PA      . bisacodyl (DULCOLAX) EC tablet 10 mg  10 mg Oral Daily Ardelle Balls, PA   10 mg at 04/03/11 1026   Or  . bisacodyl (DULCOLAX) suppository 10 mg  10 mg Rectal Daily Donielle Margaretann Loveless, PA      . cefUROXime (ZINACEF) 1.5 g in dextrose 5 % 50 mL IVPB  1.5 g Intravenous Q12H Ardelle Balls, PA   1.5 g at 04/03/11 0834  . cloNIDine (CATAPRES) tablet 0.2 mg  0.2 mg Oral TID Kathlee Nations Trigt III, MD   0.2 mg at 04/03/11 2225  . colchicine tablet 0.6 mg  0.6 mg Oral BID PRN Kathlee Nations Trigt III, MD      . darbepoetin [facility]) injection 100 mcg  100 mcg Subcutaneous Q Mon-1800 Jay K. Allena Katz, MD   100 mcg at 03/31/11 1803  . docusate sodium (COLACE) capsule 200 mg  200 mg Oral Daily Ardelle Balls, PA   200 mg at 04/03/11 1027  . febuxostat (ULORIC) tablet 80 mg  80 mg Oral Daily Ardelle Balls, PA   80 mg at 04/03/11 1035  . furosemide (LASIX) injection 80 mg  80 mg Intravenous BID Kathlee Nations Trigt III, MD   80 mg at 04/04/11 0843  . insulin aspart (novoLOG) injection 0-24 Units  0-24 Units Subcutaneous Q4H Kerin Perna III, MD   2 Units at 04/03/11 2034  . insulin glargine (LANTUS) injection 20 Units  20 Units Subcutaneous BID Kathlee Nations Suann Larry, MD  20 Units at 04/03/11 2216  . magnesium sulfate IVPB 4 g 100 mL  4 g Intravenous Once Ardelle Balls, PA      . metoprolol (LOPRESSOR) injection 10 mg  10 mg Intravenous Q2H PRN Kathlee Nations Trigt III, MD   10 mg at 04/02/11 1223  . metoprolol (LOPRESSOR) tablet 50 mg  50 mg Oral BID Kathlee Nations Trigt III, MD   50 mg at 04/03/11 2225  . milrinone (PRIMACOR) infusion 200 mcg/mL (0.2 mg/mL)  0.3 mcg/kg/min Intravenous Continuous Kathlee Nations Trigt III, MD   0.3 mcg/kg/min at 04/02/11 0930  . morphine 4 MG/ML injection 2-5 mg  2-5 mg Intravenous Q1H PRN Gary Fleet Abbott, PHARMD   4 mg at 04/03/11 1025  . nitroGLYCERIN 0.2 mg/mL in dextrose 5 % infusion  0-100 mcg/min Intravenous Continuous Ardelle Balls, PA   15 mcg/min at 04/03/11 0300  . nitroPRUSSide (NIPRIDE) 50 mg in dextrose 5 % 250 mL infusion  0.25-0.5 mcg/kg/min Intravenous Continuous Kathlee Nations Trigt III, MD   0.028 mcg/kg/min at 04/03/11 0100  .  omega-3 acid ethyl esters (LOVAZA) capsule 1 g  1 g Oral TID AC Arman Filter, RPH   1 g at 04/04/11 1610  . ondansetron (ZOFRAN) injection 4 mg  4 mg Intravenous Q6H PRN Ardelle Balls, PA   4 mg at 04/03/11 0930  . oxyCODONE (Oxy IR/ROXICODONE) immediate release tablet 5-10 mg  5-10 mg Oral Q3H PRN Ardelle Balls, PA   10 mg at 04/04/11 0801  . pantoprazole (PROTONIX) EC tablet 40 mg  40 mg Oral Q1200 Ardelle Balls, PA   40 mg at 04/03/11 1151  . rosuvastatin (CRESTOR) tablet 20 mg  20 mg Oral q1800 Kerry Hough, PA   20 mg at 04/03/11 1739  . rosuvastatin (CRESTOR) tablet 20 mg  20 mg Oral Daily Kathlee Nations Trigt III, MD   20 mg at 04/03/11 1029  . sodium chloride 0.9 % injection 3 mL  3 mL Intravenous Q12H Ardelle Balls, PA   3 mL at 04/03/11 2245  . sodium chloride 0.9 % injection 3 mL  3 mL Intravenous PRN Ardelle Balls, PA      . terazosin (HYTRIN) capsule 10 mg  10 mg Oral QHS Kathlee Nations Trigt III, MD   10 mg at 04/03/11 2225  . DISCONTD: omega-3 acid ethyl esters (LOVAZA) capsule 3 g  3 g Oral Daily Ardelle Balls, PA   1 g at 04/03/11 1035    PE: General appearance: alert, cooperative and no distress Lungs: clear to auscultation bilaterally and no wheezes or rales Heart: regular rate and rhythm, no murmurs, rubs gallops Extremities: edema 1+ pitting lower extremeties bilaterally Pulses: 2+ radials bilat.  2+DPs Lab Results:   Basename 04/04/11 0411 04/03/11 0445 04/02/11 1630 04/02/11 1621  WBC 15.5* 17.2* -- 17.8*  HGB 8.3* 8.9* 9.9* --  HCT 25.6* 26.9* 29.0* --  PLT 259 245 -- 219   BMET  Basename 04/04/11 0411 04/03/11 0445 04/02/11 1630 04/02/11 0400  NA 133* 135 136 --  K 4.3 4.9 4.1 --  CL 99 102 110 --  CO2 25 23 -- 19  GLUCOSE 102* 127* 95 --  BUN 52* 44* 35* --  CREATININE 4.26* 4.02* 3.80* --  CALCIUM 9.0 8.8 -- 8.2*   PT/INR  Basename 04/01/11 1430  LABPROT 17.6*  INR 1.42   CHEST - 2 VIEW ,  04/04/11 Comparison: 04/03/2011  Findings: The patient  is status post median sternotomy and CABG. A  right internal jugular CVP is in place with the tip located in the  region of the superior cavoatrial junction. A left dual lumen  subclavian catheter has tips in the right atrium and in the region  of the superior cavoatrial junction.  Slight improvement in overall lung volumes is seen. Only minimal  atelectasis is seen at the left base. The lungs otherwise appear  clear with no signs of focal infiltrate or congestive failure. A  stable degree of cardiomegaly is identified.  IMPRESSION:  Lung volumes with minimal left basilar subsegmental atelectasis.  Stable support lines and tubes.   Assessment/Plan  Principal Problem:  *NSTEMI (non-ST elevated myocardial infarction) pk Troponin 22.9 Active Problems:  HYPERLIPIDEMIA  HYPERTENSION  RENAL INSUFFICIENCY, Cr 3.7  CEREBROVASCULAR ACCIDENT, HX OF CVA X 4  Gout  CAD, 3 vessel significant disease at cath this admission. for CABG this admission  Plan:  POD #3 s/p CABG x4.  BP continues to fluctuate. Clonidine/Tekturna/lopressor. Consider titrating up his BB. I/O -4,253.  Good  diuresis with Lasix 80mg  IV BID, recommend switching to PO Lasix.    LOS: 8 days    HAGER,BRYAN W 04/04/2011 9:03 AM  Agree with note written by Jones Skene [facility]  Looks great. NSR. VSS. BP still a bit high. POD #3. Good diuresis (4 liters). With iv lasix. Transfer to 2000 per TCTS. ROV with Dr. Daphene Jaeger after D/C.  Runell Gess 04/04/2011 1:51 PM

## 2011-04-04 NOTE — Progress Notes (Signed)
CSW spoke with pt and pt sister. Both requesting SNF placement at d/c. CSW has faxed pt out and will follow to facilitate d/c to SNF as pt progresses medically.   Baxter Flattery, MSW [phone]

## 2011-04-05 ENCOUNTER — Inpatient Hospital Stay (HOSPITAL_COMMUNITY): Payer: Medicare Other

## 2011-04-05 LAB — GLUCOSE, CAPILLARY
Glucose-Capillary: 103 mg/dL — ABNORMAL HIGH (ref 70–99)
Glucose-Capillary: 135 mg/dL — ABNORMAL HIGH (ref 70–99)
Glucose-Capillary: 135 mg/dL — ABNORMAL HIGH (ref 70–99)
Glucose-Capillary: 144 mg/dL — ABNORMAL HIGH (ref 70–99)
Glucose-Capillary: 173 mg/dL — ABNORMAL HIGH (ref 70–99)
Glucose-Capillary: 179 mg/dL — ABNORMAL HIGH (ref 70–99)

## 2011-04-05 LAB — BASIC METABOLIC PANEL
BUN: 58 mg/dL — ABNORMAL HIGH (ref 6–23)
CO2: 27 mEq/L (ref 19–32)
Calcium: 9.1 mg/dL (ref 8.4–10.5)
Chloride: 98 mEq/L (ref 96–112)
Creatinine, Ser: 4.38 mg/dL — ABNORMAL HIGH (ref 0.50–1.35)
GFR calc Af Amer: 17 mL/min — ABNORMAL LOW (ref >90)
GFR calc non Af Amer: 14 mL/min — ABNORMAL LOW (ref >90)
Glucose, Bld: 92 mg/dL (ref 70–99)
Potassium: 4 mEq/L (ref 3.5–5.1)
Sodium: 135 mEq/L (ref 135–145)

## 2011-04-05 LAB — CBC
HCT: 25.5 % — ABNORMAL LOW (ref 39.0–52.0)
Hemoglobin: 8.4 g/dL — ABNORMAL LOW (ref 13.0–17.0)
MCH: 29.1 pg (ref 26.0–34.0)
MCHC: 32.9 g/dL (ref 30.0–36.0)
MCV: 88.2 fL (ref 78.0–100.0)
Platelets: 303 10*3/uL (ref 150–400)
RBC: 2.89 MIL/uL — ABNORMAL LOW (ref 4.22–5.81)
RDW: 14.7 % (ref 11.5–15.5)
WBC: 14.3 10*3/uL — ABNORMAL HIGH (ref 4.0–10.5)

## 2011-04-05 MED ORDER — PANTOPRAZOLE SODIUM 40 MG PO TBEC: 40.0000 mg | DELAYED_RELEASE_TABLET | Freq: Every day | ORAL | Status: DC

## 2011-04-05 MED ORDER — PANTOPRAZOLE SODIUM 40 MG PO TBEC
40.0000 mg | DELAYED_RELEASE_TABLET | Freq: Every day | ORAL | Status: DC
  Administered 2011-04-05 – 2011-04-08 (×3): 40 mg via ORAL
  Filled 2011-04-05 (×3): qty 1

## 2011-04-05 MED ORDER — SODIUM CHLORIDE 0.9 % IJ SOLN: 3.0000 mL | INTRAMUSCULAR | Status: DC | PRN

## 2011-04-05 MED ORDER — OXYCODONE HCL 5 MG PO TABS
10.0000 mg | ORAL_TABLET | ORAL | Status: DC | PRN
  Administered 2011-04-05 – 2011-04-08 (×8): 10 mg via ORAL
  Filled 2011-04-05 (×8): qty 2

## 2011-04-05 MED ORDER — SODIUM CHLORIDE 0.9 % IV SOLN: 250.0000 mL | INTRAVENOUS | Status: DC | PRN

## 2011-04-05 MED ORDER — FUROSEMIDE 40 MG PO TABS
40.0000 mg | ORAL_TABLET | Freq: Every day | ORAL | Status: DC
  Administered 2011-04-06 – 2011-04-08 (×3): 40 mg via ORAL
  Filled 2011-04-05 (×3): qty 1

## 2011-04-05 MED ORDER — SODIUM CHLORIDE 0.9 % IJ SOLN
3.0000 mL | Freq: Two times a day (BID) | INTRAMUSCULAR | Status: DC
  Administered 2011-04-05 – 2011-04-08 (×6): 3 mL via INTRAVENOUS

## 2011-04-05 MED ORDER — BISACODYL 10 MG RE SUPP: 10.0000 mg | Freq: Every day | RECTAL | Status: DC | PRN

## 2011-04-05 MED ORDER — MOVING RIGHT ALONG BOOK
Freq: Once | Status: AC
  Administered 2011-04-05: 22:00:00
  Filled 2011-04-05: qty 1

## 2011-04-05 MED ORDER — BISACODYL 5 MG PO TBEC: 10.0000 mg | DELAYED_RELEASE_TABLET | Freq: Every day | ORAL | Status: DC | PRN

## 2011-04-05 MED ORDER — DOCUSATE SODIUM 100 MG PO CAPS
200.0000 mg | ORAL_CAPSULE | Freq: Every day | ORAL | Status: DC
  Administered 2011-04-08: 200 mg via ORAL
  Filled 2011-04-05 (×3): qty 2

## 2011-04-05 MED ORDER — METOPROLOL TARTRATE 50 MG PO TABS
50.0000 mg | ORAL_TABLET | Freq: Two times a day (BID) | ORAL | Status: DC
  Administered 2011-04-05 – 2011-04-08 (×6): 50 mg via ORAL
  Filled 2011-04-05 (×8): qty 1

## 2011-04-05 MED ORDER — INSULIN ASPART 100 UNIT/ML ~~LOC~~ SOLN
0.0000 [IU] | Freq: Three times a day (TID) | SUBCUTANEOUS | Status: DC
  Administered 2011-04-05: 4 [IU] via SUBCUTANEOUS
  Administered 2011-04-05 – 2011-04-08 (×8): 2 [IU] via SUBCUTANEOUS

## 2011-04-05 NOTE — Progress Notes (Signed)
Patient ID: [name], male DOB: 17-Mar-1961, 51 y.o. MRN: [id number]   S: No complaints, walked in halls with a walker. UOP 5170 last 24 hrs.   O:BP 165/94  Pulse 85  Temp(Src) 99.5 F (37.5 C) (Oral)  Resp 15  Ht 6\' 1"  (1.854 m)  Wt 115.9 kg (255 lb 8.2 oz)  BMI 33.71 kg/m2  SpO2 99%   Intake/Output Summary (Last 24 hours) at 04/05/11 1200 Last data filed at 04/05/11 0900   Gross per 24 hour   Intake  1080 ml   Output  4020 ml   Net  -2940 ml    Weight change: -3.7 kg (-8 lb 2.5 oz)  Gen: Comfortably resting in the recliner  CVS: Pulse regular in rate and rhythm heart sounds S1 and S2 are distant  Resp: Clear to auscultation bilaterally, suboptimal inspiratory effort  Abd: Soft, obese, bowel sounds normal  Ext: 1+ edema over ankles   .  aliskiren  150 mg  Oral  Daily   .  aspirin EC  325 mg  Oral  Daily   .  cloNIDine  0.2 mg  Oral  BID   .  docusate sodium  200 mg  Oral  Daily   .  furosemide  40 mg  Oral  Daily   .  insulin aspart  0-24 Units  Subcutaneous  TID AC & HS   .  insulin glargine  20 Units  Subcutaneous  BID   .  metoprolol tartrate  50 mg  Oral  BID   .  moving right along book   Does not apply  Once   .  pantoprazole  40 mg  Oral  QAC breakfast   .  polysaccharide iron  150 mg  Oral  Daily   .  rosuvastatin  20 mg  Oral  Daily   .  sodium chloride  3 mL  Intravenous  Q12H   .  terazosin  10 mg  Oral  QHS   .  DISCONTD: acetaminophen (TYLENOL) oral liquid 160 mg/5 mL  975 mg  Per Tube  Q6H   .  DISCONTD: acetaminophen  1,000 mg  Oral  Q6H   .  DISCONTD: antiseptic oral rinse  15 mL  Mouth Rinse  BID   .  DISCONTD: aspirin  324 mg  Per Tube  Daily   .  DISCONTD: bisacodyl  10 mg  Oral  Daily   .  DISCONTD: bisacodyl  10 mg  Rectal  Daily   .  DISCONTD: cloNIDine  0.2 mg  Oral  TID   .  DISCONTD: darbepoetin (ARANESP) injection - NON-DIALYSIS  100 mcg  Subcutaneous  Q Mon-1800   .  DISCONTD: docusate sodium  200 mg  Oral  Daily   .  DISCONTD:  febuxostat  80 mg  Oral  Daily   .  DISCONTD: furosemide  80 mg  Intravenous  BID   .  DISCONTD: furosemide  80 mg  Oral  Daily   .  DISCONTD: insulin aspart  0-24 Units  Subcutaneous  Q4H   .  DISCONTD: magnesium sulfate  4 g  Intravenous  Once   .  DISCONTD: metoprolol tartrate  50 mg  Oral  BID   .  DISCONTD: omega-3 acid ethyl esters  1 g  Oral  TID AC   .  DISCONTD: pantoprazole  40 mg  Oral  Q1200   .  DISCONTD: rosuvastatin  20 mg  Oral  q40   .  DISCONTD: sodium chloride  3 mL  Intravenous  Q12H    Dg Chest 2 View  04/05/2011 *RADIOLOGY REPORT* Clinical Data: CABG. CHEST - 2 VIEW Comparison: 04/04/2011. Findings: Prior CABG. Support devices are unchanged. Stable cardiomegaly. No confluent airspace opacity or effusion. IMPRESSION: Cardiomegaly. No acute findings. Original Report Authenticated By: Cyndie Chime, M.D.  Dg Chest 2 View  04/04/2011 *RADIOLOGY REPORT* Clinical Data: Post CABG CHEST - 2 VIEW Comparison: 04/03/2011 Findings: The patient is status post median sternotomy and CABG. A right internal jugular CVP is in place with the tip located in the region of the superior cavoatrial junction. A left dual lumen subclavian catheter has tips in the right atrium and in the region of the superior cavoatrial junction. Slight improvement in overall lung volumes is seen. Only minimal atelectasis is seen at the left base. The lungs otherwise appear clear with no signs of focal infiltrate or congestive failure. A stable degree of cardiomegaly is identified. IMPRESSION: Lung volumes with minimal left basilar subsegmental atelectasis. Stable support lines and tubes. Original Report Authenticated By: Bertha Stakes, M.D.   BMET   Lab  04/05/11 0450  04/04/11 0411  04/03/11 0445  04/02/11 1630  04/02/11 1621  04/02/11 0400  04/01/11 2129  04/01/11 2121  04/01/11 1421  04/01/11 0430  03/31/11 0500  03/30/11 0630   NA  135  133*  135  136  --  136  --  139  139  --  --  --   K  4.0  4.3  4.9   4.1  --  4.6  --  4.7  4.3  --  --  --   CL  98  99  102  110  --  107  --  110  --  104  --  --   CO2  27  25  23   --  --  19  --  --  --  23  21  22    GLUCOSE  92  102*  127*  95  --  147*  --  199*  146*  --  --  --   BUN  58*  52*  44*  35*  --  37*  --  33*  --  40*  --  --   CREATININE  4.38*  4.26*  4.02*  3.80*  3.81*  3.36*  3.11*  --  --  --  --  --   ALB  --  --  --  --  --  --  --  --  --  --  --  --   CALCIUM  9.1  9.0  8.8  --  --  8.2*  --  --  --  9.1  9.0  9.1   PHOS  --  --  --  --  --  --  --  --  --  4.1  3.3  3.6    CBC   Lab  04/05/11 0450  04/04/11 0411  04/03/11 0445  04/02/11 1630  04/02/11 1621   WBC  14.3*  15.5*  17.2*  --  17.8*   NEUTROABS  --  --  --  --  --   HGB  8.4*  8.3*  8.9*  9.9*  --   HCT  25.5*  25.6*  26.9*  29.0*  --   MCV  88.2  88.6  87.9  --  86.0   PLT  303  259  245  --  219    Assessment/Plan:  1. Chronic kidney disease stage IV: rise in creatinine associated with diuresis.  Lasix decreased, now at 40 mg/day po to start tomorrow. Left IJ tunneled hemodialysis catheter is in situ if needed.  2. Coronary artery disease status post non-ST elevation MI and now status post multivessel CABG: Management per cardiothoracic surgery 3. Hypertension: On metoprolol, furosemide, tekturna, clonidine and Hytrin- BP declining with diuresis, will decrease BP meds (d/c Tekturna) 4. Anemia of chronic kidney disease: On aranesp therapy, monitor for postoperative drop.   Vinson Moselle, MD [employer] [phone] cell 04/05/2011, 12:06 PM

## 2011-04-05 NOTE — Plan of Care (Signed)
Problem: Phase III Progression Outcomes Goal: Time patient transferred to PCTU/Telemetry POD Outcome: Completed/Met Date Met:  04/05/11 1619

## 2011-04-05 NOTE — Progress Notes (Signed)
Subjective:  No CP or SOB  Objective:  Temp:  [97.9 F (36.6 C)-99.1 F (37.3 C)] 99.1 F (37.3 C) (01/19 0744) Pulse Rate:  [57-98] 85  (01/19 0700) Resp:  [10-19] 15  (01/19 0700) BP: (104-138)/(57-84) 134/76 mmHg (01/19 0700) SpO2:  [93 %-100 %] 98 % (01/19 0700) Weight:  [115.9 kg (255 lb 8.2 oz)] 115.9 kg (255 lb 8.2 oz) (01/19 0500) Weight change: -3.7 kg (-8 lb 2.5 oz)  Intake/Output from previous day: 01/18 0701 - 01/19 0700 In: 1220 [P.O.:1140; I.V.:80] Out: 5170 [Urine:5170]  Intake/Output from this shift:    Physical Exam: General appearance: alert and cooperative Neck: no adenopathy, no carotid bruit, no JVD, supple, symmetrical, trachea midline and thyroid not enlarged, symmetric, no tenderness/mass/nodules Lungs: clear to auscultation bilaterally Heart: regular rate and rhythm, S1, S2 normal, no murmur, click, rub or gallop Extremities: extremities normal, atraumatic, no cyanosis or edema  Lab Results: Results for orders placed during the hospital encounter of 03/27/11 (from the past 48 hour(s))  GLUCOSE, CAPILLARY     Status: Abnormal   Collection Time   04/03/11 12:25 PM      Component Value Range Comment   Glucose-Capillary 168 (*) 70 - 99 (mg/dL)    Comment 1 Documented in Chart      Comment 2 Notify RN     GLUCOSE, CAPILLARY     Status: Abnormal   Collection Time   04/03/11  3:50 PM      Component Value Range Comment   Glucose-Capillary 145 (*) 70 - 99 (mg/dL)    Comment 1 Documented in Chart      Comment 2 Notify RN     GLUCOSE, CAPILLARY     Status: Abnormal   Collection Time   04/03/11  7:20 PM      Component Value Range Comment   Glucose-Capillary 150 (*) 70 - 99 (mg/dL)    Comment 1 Documented in Chart      Comment 2 Notify RN     GLUCOSE, CAPILLARY     Status: Abnormal   Collection Time   04/04/11 12:19 AM      Component Value Range Comment   Glucose-Capillary 108 (*) 70 - 99 (mg/dL)   GLUCOSE, CAPILLARY     Status: Abnormal   Collection Time   04/04/11  3:30 AM      Component Value Range Comment   Glucose-Capillary 105 (*) 70 - 99 (mg/dL)   BASIC METABOLIC PANEL     Status: Abnormal   Collection Time   04/04/11  4:11 AM      Component Value Range Comment   Sodium 133 (*) 135 - 145 (mEq/L)    Potassium 4.3  3.5 - 5.1 (mEq/L)    Chloride 99  96 - 112 (mEq/L)    CO2 25  19 - 32 (mEq/L)    Glucose, Bld 102 (*) 70 - 99 (mg/dL)    BUN 52 (*) 6 - 23 (mg/dL)    Creatinine, Ser 4.09 (*) 0.50 - 1.35 (mg/dL)    Calcium 9.0  8.4 - 10.5 (mg/dL)    GFR calc non Af Amer 15 (*) >90 (mL/min)    GFR calc Af Amer 17 (*) >90 (mL/min)   CBC     Status: Abnormal   Collection Time   04/04/11  4:11 AM      Component Value Range Comment   WBC 15.5 (*) 4.0 - 10.5 (K/uL)    RBC 2.89 (*) 4.22 - 5.81 (MIL/uL)  Hemoglobin 8.3 (*) 13.0 - 17.0 (g/dL)    HCT 02.7 (*) 25.3 - 52.0 (%)    MCV 88.6  78.0 - 100.0 (fL)    MCH 28.7  26.0 - 34.0 (pg)    MCHC 32.4  30.0 - 36.0 (g/dL)    RDW 66.4  40.3 - 47.4 (%)    Platelets 259  150 - 400 (K/uL)   POCT I-STAT 3, BLOOD GAS (G3+)     Status: Abnormal   Collection Time   04/04/11  4:20 AM      Component Value Range Comment   pH, Arterial 7.279 (*) 7.350 - 7.450     pCO2 arterial 29.4 (*) 35.0 - 45.0 (mmHg)    pO2, Arterial 101.0 (*) 80.0 - 100.0 (mmHg)    Bicarbonate 13.7 (*) 20.0 - 24.0 (mEq/L)    TCO2 15  0 - 100 (mmol/L)    O2 Saturation 97.0      Acid-base deficit 11.0 (*) 0.0 - 2.0 (mmol/L)    Patient temperature 100.0 F      Sample type ARTERIAL     GLUCOSE, CAPILLARY     Status: Normal   Collection Time   04/04/11  8:07 AM      Component Value Range Comment   Glucose-Capillary 80  70 - 99 (mg/dL)    Comment 1 Documented in Chart      Comment 2 Notify RN     GLUCOSE, CAPILLARY     Status: Abnormal   Collection Time   04/04/11 11:53 AM      Component Value Range Comment   Glucose-Capillary 162 (*) 70 - 99 (mg/dL)    Comment 1 Notify RN      Comment 2 Documented in Chart       GLUCOSE, CAPILLARY     Status: Abnormal   Collection Time   04/04/11  4:07 PM      Component Value Range Comment   Glucose-Capillary 105 (*) 70 - 99 (mg/dL)    Comment 1 Documented in Chart      Comment 2 Notify RN     GLUCOSE, CAPILLARY     Status: Abnormal   Collection Time   04/04/11  7:29 PM      Component Value Range Comment   Glucose-Capillary 134 (*) 70 - 99 (mg/dL)    Comment 1 Documented in Chart      Comment 2 Notify RN     GLUCOSE, CAPILLARY     Status: Abnormal   Collection Time   04/04/11 11:50 PM      Component Value Range Comment   Glucose-Capillary 103 (*) 70 - 99 (mg/dL)   BASIC METABOLIC PANEL     Status: Abnormal   Collection Time   04/05/11  4:50 AM      Component Value Range Comment   Sodium 135  135 - 145 (mEq/L)    Potassium 4.0  3.5 - 5.1 (mEq/L)    Chloride 98  96 - 112 (mEq/L)    CO2 27  19 - 32 (mEq/L)    Glucose, Bld 92  70 - 99 (mg/dL)    BUN 58 (*) 6 - 23 (mg/dL)    Creatinine, Ser 2.59 (*) 0.50 - 1.35 (mg/dL)    Calcium 9.1  8.4 - 10.5 (mg/dL)    GFR calc non Af Amer 14 (*) >90 (mL/min)    GFR calc Af Amer 17 (*) >90 (mL/min)   CBC     Status: Abnormal   Collection  Time   04/05/11  4:50 AM      Component Value Range Comment   WBC 14.3 (*) 4.0 - 10.5 (K/uL)    RBC 2.89 (*) 4.22 - 5.81 (MIL/uL)    Hemoglobin 8.4 (*) 13.0 - 17.0 (g/dL)    HCT 16.1 (*) 09.6 - 52.0 (%)    MCV 88.2  78.0 - 100.0 (fL)    MCH 29.1  26.0 - 34.0 (pg)    MCHC 32.9  30.0 - 36.0 (g/dL)    RDW 04.5  40.9 - 81.1 (%)    Platelets 303  150 - 400 (K/uL)   GLUCOSE, CAPILLARY     Status: Abnormal   Collection Time   04/05/11  7:15 AM      Component Value Range Comment   Glucose-Capillary 179 (*) 70 - 99 (mg/dL)    Comment 1 Notify RN       Imaging: Imaging results have been reviewed  Assessment/Plan:   1. Principal Problem: 2.  *NSTEMI (non-ST elevated myocardial infarction) pk Troponin 22.9, along with 2nd NSTEMI in hospital prior to CABG 3. Active Problems: 4.   HYPERLIPIDEMIA 5.  HYPERTENSION 6.  RENAL INSUFFICIENCY, Cr 3.7 7.  CEREBROVASCULAR ACCIDENT, HX OF CVA X 4 8.  Gout 9.  CAD, 3 vessel significant disease at cath this admission. for CABG this admission 10.   Time Spent Directly with Patient:  20 minutes  Length of Stay:  LOS: 9 days   POD#4 CABG. Progressing nicely. Ambulating w/o difficulty. VSS. Exam benign. PCXR improved. I/O neg. Might want to back off diuresis since BUN increasing and appears dry. Transfer to 2000 per [facility].    [name] J 04/05/2011, 8:00 AM

## 2011-04-05 NOTE — Progress Notes (Signed)
[facility] DAILY PROGRESS NOTE                   [address]            [place] [address]          [phone]      4 Days Post-Op Procedure(s) (LRB): CORONARY ARTERY BYPASS GRAFTING (CABG) (N/A) INSERTION OF DIALYSIS CATHETER (N/A)  Total Length of Stay:  LOS: 9 days   Subjective: No complaints  Objective: Vital signs in last 24 hours: Patient Vitals for the past 24 hrs:  BP Temp Temp src Pulse Resp SpO2 Weight  04/05/11 0900 165/94 mmHg - - 85  15  99 % -  04/05/11 0800 150/87 mmHg - - 79  17  97 % -  04/05/11 0744 - 99.1 F (37.3 C) Oral - - - -  04/05/11 0700 134/76 mmHg - - 85  15  98 % -  04/05/11 0600 - - - 82  15  98 % -  04/05/11 0500 138/76 mmHg - - 59  16  100 % 255 lb 8.2 oz (115.9 kg)  04/05/11 0400 120/65 mmHg - - 60  12  99 % -  04/05/11 0300 135/65 mmHg - - 60  10  98 % -  04/05/11 0200 110/67 mmHg - - 61  10  98 % -  04/05/11 0100 111/66 mmHg - - 61  12  98 % -  04/05/11 0000 120/59 mmHg - - 62  11  98 % -  04/04/11 2352 - 98.3 F (36.8 C) Oral - - - -  04/04/11 2315 104/58 mmHg - - 57  - - -  04/04/11 2300 104/58 mmHg - - 64  11  97 % -  04/04/11 2230 - - - 64  12  97 % -  04/04/11 2210 - - - 64  16  96 % -  04/04/11 2200 120/68 mmHg - - 63  13  96 % -  04/04/11 2100 127/68 mmHg - - 65  12  97 % -  04/04/11 2030 - - - 65  13  98 % -  04/04/11 2000 119/63 mmHg - Oral 65  16  98 % -  04/04/11 1926 - 98.4 F (36.9 C) Oral - - - -  04/04/11 1900 113/57 mmHg - - 67  18  98 % -  04/04/11 1800 128/74 mmHg - - 72  16  93 % -  04/04/11 1700 128/74 mmHg - - 63  15  99 % -  04/04/11 1604 - 98.5 F (36.9 C) Oral - - - -  04/04/11 1600 118/80 mmHg - - 62  14  99 % -  04/04/11 1500 110/66 mmHg - - 98  16  100 % -  04/04/11 1400 123/63 mmHg - - 63  16  100 % -  04/04/11 1300 - - - 84  16  100 % -  04/04/11 1200 107/64 mmHg - - 60  17  99 % -  04/04/11 1155 - 98.2 F (36.8 C) Oral - - - -  04/04/11 1100 126/65 mmHg - - 65  15  100 % -   Wt  Readings from Last 3 Encounters:  04/05/11 255 lb 8.2 oz (115.9 kg)  04/05/11 255 lb 8.2 oz (115.9 kg)  04/05/11 255 lb 8.2 oz (115.9 kg)   Weight change: -8 lb 2.5 oz (-3.7 kg)     Intake/Output from previous  day: 01/18 0701 - 01/19 0700 In: 1220 [P.O.:1140; I.V.:80] Out: 5170 [Urine:5170]  Intake/Output this shift: Total I/O In: 300 [P.O.:300] Out: 250 [Urine:250]  Current Meds: Scheduled Meds:   . acetaminophen  1,000 mg Oral Q6H   Or  . acetaminophen (TYLENOL) oral liquid 160 mg/5 mL  975 mg Per Tube Q6H  . aliskiren  150 mg Oral Daily  . antiseptic oral rinse  15 mL Mouth Rinse BID  . aspirin EC  325 mg Oral Daily   Or  . aspirin  324 mg Per Tube Daily  . bisacodyl  10 mg Oral Daily   Or  . bisacodyl  10 mg Rectal Daily  . cloNIDine  0.2 mg Oral BID  . darbepoetin (ARANESP) injection - NON-DIALYSIS  100 mcg Subcutaneous Q Mon-1800  . docusate sodium  200 mg Oral Daily  . febuxostat  80 mg Oral Daily  . furosemide  80 mg Oral Daily  . insulin aspart  0-24 Units Subcutaneous Q4H  . insulin glargine  20 Units Subcutaneous BID  . metoprolol tartrate  50 mg Oral BID  . omega-3 acid ethyl esters  1 g Oral TID AC  . pantoprazole  40 mg Oral Q1200  . polysaccharide iron  150 mg Oral Daily  . rosuvastatin  20 mg Oral q1800  . rosuvastatin  20 mg Oral Daily  . sodium chloride  3 mL Intravenous Q12H  . terazosin  10 mg Oral QHS  . DISCONTD: cloNIDine  0.2 mg Oral TID  . DISCONTD: furosemide  80 mg Intravenous BID  . DISCONTD: magnesium sulfate  4 g Intravenous Once    PRN Meds:.sodium chloride, colchicine, morphine, ondansetron (ZOFRAN) IV, oxyCODONE, sodium chloride, DISCONTD: metoprolol  General appearance: alert, cooperative and no distress Heart: regular rate and rhythm, S1, S2 normal, no murmur, click, rub or gallop Lungs: clear to auscultation bilaterally Wound: stenum stable  Lab Results: CBC: Basename 04/05/11 0450 04/04/11 0411  WBC 14.3* 15.5*  HGB  8.4* 8.3*  HCT 25.5* 25.6*  PLT 303 259   BMET:  Basename 04/05/11 0450 04/04/11 0411  NA 135 133*  K 4.0 4.3  CL 98 99  CO2 27 25  GLUCOSE 92 102*  BUN 58* 52*  CREATININE 4.38* 4.26*  CALCIUM 9.1 9.0    PT/INR: No results found for this basename: LABPROT,INR in the last 72 hours Radiology: Dg Chest 2 View  04/05/2011  *RADIOLOGY REPORT*  Clinical Data: CABG.  CHEST - 2 VIEW  Comparison: 04/04/2011.  Findings: Prior CABG.  Support devices are unchanged.  Stable cardiomegaly.  No confluent airspace opacity or effusion.  IMPRESSION: Cardiomegaly.  No acute findings.  Original Report Authenticated By: Cyndie Chime, M.D.   Dg Chest 2 View  04/04/2011  *RADIOLOGY REPORT*  Clinical Data: Post CABG  CHEST - 2 VIEW  Comparison: 04/03/2011  Findings: The patient is status post median sternotomy and CABG.  A right internal jugular CVP is in place with the tip located in the region of the superior cavoatrial junction.  A left dual lumen subclavian catheter has tips in the right atrium and in the region of the superior cavoatrial junction.  Slight improvement in overall lung volumes is seen.   Only minimal atelectasis is seen at the left base. The lungs otherwise appear clear with no signs of focal infiltrate or congestive failure. A stable degree of cardiomegaly is identified.  IMPRESSION: Lung volumes with minimal left basilar subsegmental atelectasis. Stable support lines and tubes.  Original Report Authenticated By: Bertha Stakes, M.D.     Assessment/Plan: S/P Procedure(s) (LRB): CORONARY ARTERY BYPASS GRAFTING (CABG) (N/A) INSERTION OF DIALYSIS CATHETER (N/A) Mobilize Plan for transfer to step-down: see transfer orders Decrease diurtic  Delight Ovens MD  [facility]eeper [phone] Office [phone] 04/05/2011 10:17 AM

## 2011-04-06 LAB — CBC
HCT: 28.6 % — ABNORMAL LOW (ref 39.0–52.0)
Hemoglobin: 9.2 g/dL — ABNORMAL LOW (ref 13.0–17.0)
MCH: 28.8 pg (ref 26.0–34.0)
MCHC: 32.2 g/dL (ref 30.0–36.0)
MCV: 89.4 fL (ref 78.0–100.0)
Platelets: 372 10*3/uL (ref 150–400)
RBC: 3.2 MIL/uL — ABNORMAL LOW (ref 4.22–5.81)
RDW: 14.7 % (ref 11.5–15.5)
WBC: 13.2 10*3/uL — ABNORMAL HIGH (ref 4.0–10.5)

## 2011-04-06 LAB — BASIC METABOLIC PANEL
BUN: 61 mg/dL — ABNORMAL HIGH (ref 6–23)
CO2: 25 mEq/L (ref 19–32)
Calcium: 8.8 mg/dL (ref 8.4–10.5)
Chloride: 99 mEq/L (ref 96–112)
Creatinine, Ser: 4.31 mg/dL — ABNORMAL HIGH (ref 0.50–1.35)
GFR calc Af Amer: 17 mL/min — ABNORMAL LOW (ref >90)
GFR calc non Af Amer: 15 mL/min — ABNORMAL LOW (ref >90)
Glucose, Bld: 147 mg/dL — ABNORMAL HIGH (ref 70–99)
Potassium: 3.8 mEq/L (ref 3.5–5.1)
Sodium: 136 mEq/L (ref 135–145)

## 2011-04-06 LAB — GLUCOSE, CAPILLARY: Glucose-Capillary: 142 mg/dL — ABNORMAL HIGH (ref 70–99)

## 2011-04-06 NOTE — Progress Notes (Signed)
Subjective: No complaints.   Objective Vital signs in last 24 hours: Physical Exam:  Blood pressure 150/84, pulse 65, temperature 97.9 F (36.6 C), temperature source Oral, resp. rate 16, height 6\' 1"  (1.854 m), weight 113.5 kg (250 lb 3.6 oz), SpO2 95.00%.  Gen: Comfortably resting in the recliner  CVS: Pulse regular in rate and rhythm heart sounds S1 and S2 are distant  Resp: Clear to auscultation bilaterally, suboptimal inspiratory effort  Abd: Soft, obese, bowel sounds normal  Ext: 1+ edema over ankles   Intake/Output Summary (Last 24 hours) at 04/06/11 1649 Last data filed at 04/06/11 1300  Gross per 24 hour  Intake    480 ml  Output   2150 ml  Net  -1670 ml   Labs: Basic Metabolic Panel:  Lab 04/06/11 7829 04/05/11 0450 04/04/11 0411 04/03/11 0445 04/02/11 1630 04/02/11 1621 04/02/11 0400 04/01/11 2121 04/01/11 0430 03/31/11 0500  NA 136 135 133* 135 136 -- 136 139 -- --  K 3.8 4.0 4.3 4.9 4.1 -- 4.6 4.7 -- --  CL 99 98 99 102 110 -- 107 110 -- --  CO2 25 27 25 23  -- -- 19 -- 23 21  GLUCOSE 147* 92 102* 127* 95 -- 147* 199* -- --  BUN 61* 58* 52* 44* 35* -- 37* 33* -- --  CREATININE 4.31* 4.38* 4.26* 4.02* 3.80* 3.81* 3.36* -- -- --  ALB -- -- -- -- -- -- -- -- -- --  CALCIUM 8.8 9.1 9.0 8.8 -- -- 8.2* -- 9.1 9.0  PHOS -- -- -- -- -- -- -- -- 4.1 3.3   Liver Function Tests:  Lab 04/01/11 0430 03/31/11 0500  AST -- --  ALT -- --  ALKPHOS -- --  BILITOT -- --  PROT -- --  ALBUMIN 2.8* 2.8*   No results found for this basename: LIPASE:3,AMYLASE:3 in the last 168 hours No results found for this basename: AMMONIA:3 in the last 168 hours CBC:  Lab 04/06/11 0603 04/05/11 0450 04/04/11 0411 04/03/11 0445  WBC 13.2* 14.3* 15.5* 17.2*  NEUTROABS -- -- -- --  HGB 9.2* 8.4* 8.3* 8.9*  HCT 28.6* 25.5* 25.6* 26.9*  MCV 89.4 88.2 88.6 87.9  PLT 372 303 259 245   PT/INR: @labrcntip (inr:5) Cardiac Enzymes: No results found for this basename:  CKTOTAL:5,CKMB:5,CKMBINDEX:5,TROPONINI:5 in the last 168 hours CBG:  Lab 04/06/11 1610 04/06/11 1105 04/06/11 0619 04/05/11 2114 04/05/11 1625  GLUCAP 121* 124* 150* 173* 135*    Iron Studies: No results found for this basename: IRON:30,TIBC:30,TRANSFERRIN:30,FERRITIN:30 in the last 168 hours Studies/Results: Dg Chest 2 View  04/05/2011  *RADIOLOGY REPORT*  Clinical Data: CABG.  CHEST - 2 VIEW  Comparison: 04/04/2011.  Findings: Prior CABG.  Support devices are unchanged.  Stable cardiomegaly.  No confluent airspace opacity or effusion.  IMPRESSION: Cardiomegaly.  No acute findings.  Original Report Authenticated By: Cyndie Chime, M.D.   Medications:      . aspirin EC  325 mg Oral Daily  . cloNIDine  0.2 mg Oral BID  . docusate sodium  200 mg Oral Daily  . furosemide  40 mg Oral Daily  . insulin aspart  0-24 Units Subcutaneous TID AC & HS  . insulin glargine  20 Units Subcutaneous BID  . metoprolol tartrate  50 mg Oral BID  . moving right along book   Does not apply Once  . pantoprazole  40 mg Oral QAC breakfast  . polysaccharide iron  150 mg Oral Daily  .  rosuvastatin  20 mg Oral Daily  . sodium chloride  3 mL Intravenous Q12H  . terazosin  10 mg Oral QHS    I  have reviewed scheduled and prn medications.   Assessment/Plan:  1. Chronic kidney disease stage IV: creatinine stabilizing off of aliskrein and on lower diuretic dose.  No indications for HD. 2. NSTEMI, S/P CABG 3. Hypertension: stable on clonidine, BB, alpha-blocker and low-dose lasix 4. Anemia of chronic kidney disease: On aranesp.  Vinson Moselle, MD [facility] [phone] pager   [phone] cell 04/06/2011, 4:49 PM

## 2011-04-06 NOTE — Progress Notes (Addendum)
[address]            [place] [address]          [phone]     5 Days Post-Op  Procedure(s) (LRB): CORONARY ARTERY BYPASS GRAFTING (CABG) (N/A) INSERTION OF DIALYSIS CATHETER (N/A) Subjective: Looks and feels well  Objective  Telemetry NSR  Temp:  [98.2 F (36.8 C)-99.5 F (37.5 C)] 98.3 F (36.8 C) (01/20 0500) Pulse Rate:  [73-90] 73  (01/20 0500) Resp:  [15-24] 18  (01/20 0500) BP: (118-165)/(80-94) 132/85 mmHg (01/20 0500) SpO2:  [95 %-99 %] 96 % (01/20 0500) Weight:  [250 lb 3.6 oz (113.5 kg)] 250 lb 3.6 oz (113.5 kg) (01/20 0500)   Intake/Output Summary (Last 24 hours) at 04/06/11 0845 Last data filed at 04/06/11 0700  Gross per 24 hour  Intake    940 ml  Output   2825 ml  Net  -1885 ml       General appearance: alert, cooperative and no distress Heart: regular rate and rhythm and S1, S2 normal Lungs: diminished in the Left base Abdomen: soft, nontender,nondistended Extremities: minor LE edema Wound: incisions healing well  Lab Results:  Basename 04/06/11 0603 04/05/11 0450  NA 136 135  K 3.8 4.0  CL 99 98  CO2 25 27  GLUCOSE 147* 92  BUN 61* 58*  CREATININE 4.31* 4.38*  CALCIUM 8.8 9.1  MG -- --  PHOS -- --   No results found for this basename: AST:2,ALT:2,ALKPHOS:2,BILITOT:2,PROT:2,ALBUMIN:2 in the last 72 hours No results found for this basename: LIPASE:2,AMYLASE:2 in the last 72 hours  Basename 04/06/11 0603 04/05/11 0450  WBC 13.2* 14.3*  NEUTROABS -- --  HGB 9.2* 8.4*  HCT 28.6* 25.5*  MCV 89.4 88.2  PLT 372 303   No results found for this basename: CKTOTAL:4,CKMB:4,TROPONINI:4 in the last 72 hours No components found with this basename: POCBNP:3 No results found for this basename: DDIMER in the last 72 hours No results found for this basename: HGBA1C in the last 72 hours No results found for this basename: CHOL,HDL,LDLCALC,TRIG,CHOLHDL in the last 72 hours No results found for this basename:  TSH,T4TOTAL,FREET3,T3FREE,THYROIDAB in the last 72 hours No results found for this basename: VITAMINB12,FOLATE,FERRITIN,TIBC,IRON,RETICCTPCT in the last 72 hours  Medications: Scheduled    . aspirin EC  325 mg Oral Daily  . cloNIDine  0.2 mg Oral BID  . docusate sodium  200 mg Oral Daily  . furosemide  40 mg Oral Daily  . insulin aspart  0-24 Units Subcutaneous TID AC & HS  . insulin glargine  20 Units Subcutaneous BID  . metoprolol tartrate  50 mg Oral BID  . moving right along book   Does not apply Once  . pantoprazole  40 mg Oral QAC breakfast  . polysaccharide iron  150 mg Oral Daily  . rosuvastatin  20 mg Oral Daily  . sodium chloride  3 mL Intravenous Q12H  . terazosin  10 mg Oral QHS  . DISCONTD: acetaminophen (TYLENOL) oral liquid 160 mg/5 mL  975 mg Per Tube Q6H  . DISCONTD: acetaminophen  1,000 mg Oral Q6H  . DISCONTD: aliskiren  150 mg Oral Daily  . DISCONTD: antiseptic oral rinse  15 mL Mouth Rinse BID  . DISCONTD: aspirin  324 mg Per Tube Daily  . DISCONTD: bisacodyl  10 mg Oral Daily  . DISCONTD: bisacodyl  10 mg Rectal  Daily  . DISCONTD: darbepoetin (ARANESP) injection - NON-DIALYSIS  100 mcg Subcutaneous Q Mon-1800  . DISCONTD: docusate sodium  200 mg Oral Daily  . DISCONTD: febuxostat  80 mg Oral Daily  . DISCONTD: furosemide  80 mg Oral Daily  . DISCONTD: insulin aspart  0-24 Units Subcutaneous Q4H  . DISCONTD: metoprolol tartrate  50 mg Oral BID  . DISCONTD: omega-3 acid ethyl esters  1 g Oral TID AC  . DISCONTD: pantoprazole  40 mg Oral Q1200  . DISCONTD: pantoprazole  40 mg Oral QAC breakfast  . DISCONTD: rosuvastatin  20 mg Oral q1800  . DISCONTD: sodium chloride  3 mL Intravenous Q12H     Radiology/Studies:  Dg Chest 2 View  04/05/2011  *RADIOLOGY REPORT*  Clinical Data: CABG.  CHEST - 2 VIEW  Comparison: 04/04/2011.  Findings: Prior CABG.  Support devices are unchanged.  Stable cardiomegaly.  No confluent airspace opacity or effusion.  IMPRESSION:  Cardiomegaly.  No acute findings.  Original Report Authenticated By: Cyndie Chime, M.D.    INR: Will add last result for INR, ABG once components are confirmed Will add last 4 CBG results once components are confirmed  Assessment/Plan: S/P Procedure(s) (LRB): CORONARY ARTERY BYPASS GRAFTING (CABG) (N/A) INSERTION OF DIALYSIS CATHETER (N/A)  1. Making good progress, cont rehab 2. Creat improving, tx as per nephrology 3. H/Himproved, leukocytosis improving  LOS: 10 days    [name] 1/20/20138:45 AM    I have seen and examined [name] and agree with the above assessment  and plan.  Delight Ovens MD Beeper [phone] Office [phone] 04/06/2011 11:56 AM

## 2011-04-06 NOTE — Progress Notes (Signed)
Subjective: No pain.  Objective: Vital signs in last 24 hours: Temp:  [98.2 F (36.8 C)-99.5 F (37.5 C)] 98.3 F (36.8 C) (01/20 0500) Pulse Rate:  [73-90] 73  (01/20 0500) Resp:  [16-24] 18  (01/20 0500) BP: (118-161)/(80-93) 132/85 mmHg (01/20 0500) SpO2:  [95 %-98 %] 96 % (01/20 0500) Weight:  [113.5 kg (250 lb 3.6 oz)] 113.5 kg (250 lb 3.6 oz) (01/20 0500) Weight change: -2.4 kg (-5 lb 4.7 oz) Last BM Date: 04/05/11 Intake/Output from previous day:  -1865 01/19 0701 - 01/20 0700 In: 1000 [P.O.:1000] Out: 2825 [Urine:2825] Intake/Output this shift:    PE: General: Awake, NAD Heart: RRR, nl s2/s2, no M/R/G's Lungs: lungs clear Abd: soft, non-tender    Lab Results:  [facility] 04/06/11 0603 04/05/11 0450  WBC 13.2* 14.3*  HGB 9.2* 8.4*  HCT 28.6* 25.5*  PLT 372 303   BMET  Basename 04/06/11 0603 04/05/11 0450  NA 136 135  K 3.8 4.0  CL 99 98  CO2 25 27  GLUCOSE 147* 92  BUN 61* 58*  CREATININE 4.31* 4.38*  CALCIUM 8.8 9.1   No results found for this basename: TROPONINI:2,CK,MB:2 in the last 72 hours  Lab Results  Component Value Date   CHOL 206* 06/23/2008   HDL 56.60 06/23/2008   LDLDIRECT 129.4 06/23/2008   TRIG 91.0 06/23/2008   CHOLHDL 4 06/23/2008   Lab Results  Component Value Date   HGBA1C 5.8* 04/01/2011     Lab Results  Component Value Date   TSH 0.506 03/27/2011    Hepatic Function Panel No results found for this basename: PROT,ALBUMIN,AST,ALT,ALKPHOS,BILITOT,BILIDIR,IBILI in the last 72 hours No results found for this basename: CHOL in the last 72 hours No results found for this basename: PROTIME in the last 72 hours    EKG: Orders placed during the hospital encounter of 03/27/11  . EKG 12-LEAD  . EKG 12-LEAD  . EKG 12-LEAD  . EKG 12-LEAD  . EKG 12-LEAD  . ED EKG  . ED EKG  . EKG 12-LEAD  . EKG 12-LEAD  . EKG 12-LEAD  . EKG 12-LEAD  . EKG 12-LEAD  . EKG 12-LEAD  . EKG    Studies/Results: Dg Chest 2 View  04/05/2011   *RADIOLOGY REPORT*  Clinical Data: CABG.  CHEST - 2 VIEW  Comparison: 04/04/2011.  Findings: Prior CABG.  Support devices are unchanged.  Stable cardiomegaly.  No confluent airspace opacity or effusion.  IMPRESSION: Cardiomegaly.  No acute findings.  Original Report Authenticated By: Cyndie Chime, M.D.    Medications: I have reviewed the patient's current medications.    Marland Kitchen aspirin EC  325 mg Oral Daily  . cloNIDine  0.2 mg Oral BID  . docusate sodium  200 mg Oral Daily  . furosemide  40 mg Oral Daily  . insulin aspart  0-24 Units Subcutaneous TID AC & HS  . insulin glargine  20 Units Subcutaneous BID  . metoprolol tartrate  50 mg Oral BID  . moving right along book   Does not apply Once  . pantoprazole  40 mg Oral QAC breakfast  . polysaccharide iron  150 mg Oral Daily  . rosuvastatin  20 mg Oral Daily  . sodium chloride  3 mL Intravenous Q12H  . terazosin  10 mg Oral QHS  . DISCONTD: acetaminophen (TYLENOL) oral liquid 160 mg/5 mL  975 mg Per Tube Q6H  . DISCONTD: acetaminophen  1,000 mg Oral Q6H  . DISCONTD: aliskiren  150 mg Oral  Daily  . DISCONTD: antiseptic oral rinse  15 mL Mouth Rinse BID  . DISCONTD: aspirin  324 mg Per Tube Daily  . DISCONTD: bisacodyl  10 mg Oral Daily  . DISCONTD: bisacodyl  10 mg Rectal Daily  . DISCONTD: darbepoetin (ARANESP) injection - NON-DIALYSIS  100 mcg Subcutaneous Q Mon-1800  . DISCONTD: docusate sodium  200 mg Oral Daily  . DISCONTD: febuxostat  80 mg Oral Daily  . DISCONTD: furosemide  80 mg Oral Daily  . DISCONTD: insulin aspart  0-24 Units Subcutaneous Q4H  . DISCONTD: metoprolol tartrate  50 mg Oral BID  . DISCONTD: omega-3 acid ethyl esters  1 g Oral TID AC  . DISCONTD: pantoprazole  40 mg Oral Q1200  . DISCONTD: pantoprazole  40 mg Oral QAC breakfast  . DISCONTD: rosuvastatin  20 mg Oral q1800  . DISCONTD: sodium chloride  3 mL Intravenous Q12H   Assessment/Plan: Patient Active Problem List  Diagnoses  . HYPERLIPIDEMIA  .  ANEMIA-NOS  . DEPRESSION  . HYPERTENSION  . PERIPHERAL VASCULAR DISEASE  . HEMORRHOIDS  . ALLERGIC RHINITIS  . ACHALASIA  . GERD  . RENAL INSUFFICIENCY, Cr 3.7  . DEGENERATIVE JOINT DISEASE, SHOULDER  . HEADACHE  . CEREBROVASCULAR ACCIDENT, HX OF CVA X 4  . RENAL CALCULUS, HX OF  . NSTEMI (non-ST elevated myocardial infarction) pk Troponin 22.9, along with 2nd NSTEMI in hospital prior to CABG  . Gout  . CAD, 3 vessel significant disease at cath this admission. for CABG this admission   PLAN: Improving, probable d/c home soon.   LOS: 10 days   INGOLD,LAURA R 04/06/2011, 9:38 AM  Chrystie Nose, MD Attending Cardiologist [facility]

## 2011-04-07 DIAGNOSIS — Z951 Presence of aortocoronary bypass graft: Secondary | ICD-10-CM

## 2011-04-07 LAB — GLUCOSE, CAPILLARY: Glucose-Capillary: 96 mg/dL (ref 70–99)

## 2011-04-07 MED ORDER — OXYCODONE HCL 10 MG PO TABS: 10.0000 mg | ORAL_TABLET | ORAL | Status: AC | PRN

## 2011-04-07 MED ORDER — POLYSACCHARIDE IRON 150 MG PO CAPS: 150.0000 mg | ORAL_CAPSULE | Freq: Every day | ORAL | Status: DC

## 2011-04-07 MED ORDER — TERAZOSIN HCL 10 MG PO CAPS: 10.0000 mg | ORAL_CAPSULE | Freq: Every day | ORAL | Status: DC

## 2011-04-07 MED ORDER — CLONIDINE HCL 0.2 MG PO TABS
0.2000 mg | ORAL_TABLET | Freq: Three times a day (TID) | ORAL | Status: DC
  Administered 2011-04-07 – 2011-04-08 (×4): 0.2 mg via ORAL
  Filled 2011-04-07 (×6): qty 1

## 2011-04-07 MED ORDER — METOPROLOL TARTRATE 50 MG PO TABS: 50.0000 mg | ORAL_TABLET | Freq: Two times a day (BID) | ORAL | Status: DC

## 2011-04-07 NOTE — Progress Notes (Signed)
CARDIAC REHAB PHASE I   PRE:  Rate/Rhythm: 88SR  BP:  Supine: 160/84  Sitting:   Standing:    SaO2: 96%RA  MODE:  Ambulation: 550 ft   POST:  Rate/Rhythem: 108ST  BP:  Supine:   Sitting: 142/95  Standing:    SaO2: 94%RA 0930-1045 Pt walked 550 ft on RA and rolling walker. Tolerated well. Needs minimal asst when up. Pt states he has walker at home but does not know if it has wheels on front. He thinks it is a standard walker. To recliner after walk. RN notified of BP.  Duanne Limerick

## 2011-04-07 NOTE — Progress Notes (Signed)
   CARE MANAGEMENT NOTE 04/07/2011  Patient:  [name], [name]   Account Number:  [id number]  Date Initiated:  03/28/2011  Documentation initiated by:  GRAVES-BIGELOW,BRENDA  Subjective/Objective Assessment:   Pt in with cp. Plan for cath today. CM unable to speak to pt due to he is in procedure at time of visit.     Action/Plan:   CM will stop back by to see if any medicaiton needs.   Anticipated DC Date:  04/07/2011   Anticipated DC Plan:  HOME W HOME HEALTH SERVICES      DC Planning Services  CM consult      Choice offered to / List presented to:     DME arranged  WALKER - ROLLING  BEDSIDE COMMODE      DME agency  [facility]        Status of service:  In process, will continue to follow Medicare Important Message given?   (If response is "NO", the following Medicare IM given date fields will be blank) Date Medicare IM given:   Date Additional Medicare IM given:    Discharge Disposition:    Per UR Regulation:  Reviewed for med. necessity/level of care/duration of stay  Comments:  contact person:  Nelvin Tomb [phone].   04/07/11 Christoper Bushey,RN,BSN 1400 MET WITH PT AND FIANCE TO DISCUSS DISCHARGE PLANS.  PT HAD INITIALLY BEEN REFERRED TO CSW FOR SHORT TERM SNF PLACEMENT, AS HE DID NOT HAVE POST-DISCHARGE CAREGIVER. PT'S FIANCE STATES SHE WILL BE ABLE TO PROVIDE 24HR SUPERVISION AT DC, AND PT WILL NOT NEED SNF PLACEMENT.  HE IS AMBULATING QUITE WELL WITH ROLLING WALKER. REQUESTS RW AND 3 IN 1 FOR HOME. REFERRAL TO [facility]IN WITH [facility] FOR DME NEEDS.  CSW NOTIFIED THAT PT WILL NOT NEED SNF. Phone #[phone]   04-03-11 11am Avie Arenas, RNBSN - [phone] UR completed.  04-02-11 - post op CABG.  03-31-11 1440 Tomi Bamberger, [place] [phone] CM did speak to pt  and his mother. Pt planned for CABG 04-01-11 at 0620. Pt states he was visiting from [place]. He states his mom will not be able to care for him post d/c and he has a sister  Arizona Nordquist [phone] and she will be out of town after CABG. Pt has planned on Rehab if needed post d/c. CM did place a call to sister and left vm to clarify d/c plans with her.  03-28-11 1447 Tomi Bamberger, RN,BSN [phone] CM went to speak to pt and he was resting. His mom was in the room and stated that the pt was visiting from [place]. Per pt has only medicare A&B no part D that covers rx drug coverage. At d/c pt will need to be on all generic meds if possible. Per MD notes due to diffuse  CAD will consult with [facility] for possible CABG. CM will f/u on Monday.   03-28-11 [address] [place] [phone] CM will continue to moniotr for d/c disposition and medication needs.

## 2011-04-07 NOTE — Progress Notes (Signed)
[facility]  Subjective: No SOB or CP.  Coughing up phlegm   Objective: Vital signs in last 24 hours: Temp:  [97.9 F (36.6 C)-98.4 F (36.9 C)] 98.4 F (36.9 C) (01/21 0500) Pulse Rate:  [65-77] 72  (01/21 0500) Resp:  [16-18] 18  (01/21 0500) BP: (150-158)/(77-84) 158/80 mmHg (01/21 0500) SpO2:  [94 %-98 %] 98 % (01/21 0500) Weight:  [111.494 kg (245 lb 12.8 oz)] 111.494 kg (245 lb 12.8 oz) (01/21 0500) Last BM Date: 04/06/11  Intake/Output from previous day: 01/20 0701 - 01/21 0700 In: 240 [P.O.:240] Out: 2300 [Urine:2300] Intake/Output this shift:    Medications Current Facility-Administered Medications  Medication Dose Route Frequency Provider Last Rate Last Dose  . 0.9 %  sodium chloride infusion  250 mL Intravenous PRN Delight Ovens, MD      . aspirin EC tablet 325 mg  325 mg Oral Daily Ardelle Balls, PA   325 mg at 04/06/11 1054  . bisacodyl (DULCOLAX) EC tablet 10 mg  10 mg Oral Daily PRN Delight Ovens, MD       Or  . bisacodyl (DULCOLAX) suppository 10 mg  10 mg Rectal Daily PRN Delight Ovens, MD      . cloNIDine (CATAPRES) tablet 0.2 mg  0.2 mg Oral TID Ardelle Balls, PA      . colchicine tablet 0.6 mg  0.6 mg Oral BID PRN Kathlee Nations Trigt III, MD      . docusate sodium (COLACE) capsule 200 mg  200 mg Oral Daily Delight Ovens, MD      . furosemide (LASIX) tablet 40 mg  40 mg Oral Daily Delight Ovens, MD   40 mg at 04/06/11 1058  . insulin aspart (novoLOG) injection 0-24 Units  0-24 Units Subcutaneous TID AC & HS Delight Ovens, MD   2 Units at 04/06/11 2113  . metoprolol (LOPRESSOR) tablet 50 mg  50 mg Oral BID Delight Ovens, MD   50 mg at 04/06/11 2111  . oxyCODONE (Oxy IR/ROXICODONE) immediate release tablet 10 mg  10 mg Oral Q4H PRN Delight Ovens, MD   10 mg at 04/07/11 0612  . pantoprazole (PROTONIX) EC tablet 40 mg  40 mg Oral QAC breakfast Hessie Diener Markle, PHARMD   40 mg at  04/05/11 1300  . polysaccharide iron (NIFEREX) capsule 150 mg  150 mg Oral Daily Kathlee Nations Trigt III, MD   150 mg at 04/06/11 1057  . rosuvastatin (CRESTOR) tablet 20 mg  20 mg Oral Daily Kathlee Nations Trigt III, MD   20 mg at 04/06/11 1054  . sodium chloride 0.9 % injection 3 mL  3 mL Intravenous Q12H Delight Ovens, MD   3 mL at 04/06/11 2114  . sodium chloride 0.9 % injection 3 mL  3 mL Intravenous PRN Delight Ovens, MD      . terazosin (HYTRIN) capsule 10 mg  10 mg Oral QHS Kathlee Nations Trigt III, MD   10 mg at 04/06/11 2111  . DISCONTD: cloNIDine (CATAPRES) tablet 0.2 mg  0.2 mg Oral BID Kathlee Nations Trigt III, MD   0.2 mg at 04/06/11 2111  . DISCONTD: insulin glargine (LANTUS) injection 20 Units  20 Units Subcutaneous BID Kathlee Nations Trigt III, MD   20 Units at 04/06/11 2112    PE: General appearance: alert, cooperative and no distress Lungs: clear to auscultation bilaterally Heart: regular rate and rhythm, S1, S2 normal, no  murmur, click, rub or gallop Extremities: venous stasis dermatitis noted Pulses: 2+ right radial, 1+ left radial No LEE  Lab Results:   Basename 04/06/11 0603 04/05/11 0450  WBC 13.2* 14.3*  HGB 9.2* 8.4*  HCT 28.6* 25.5*  PLT 372 303   BMET  Basename 04/06/11 0603 04/05/11 0450  NA 136 135  K 3.8 4.0  CL 99 98  CO2 25 27  GLUCOSE 147* 92  BUN 61* 58*  CREATININE 4.31* 4.38*  CALCIUM 8.8 9.1   Telemetry: SR 93BPM  Assessment/Plan  Principal Problem:  *NSTEMI (non-ST elevated myocardial infarction) pk Troponin 22.9, along with 2nd NSTEMI in hospital prior to CABG Active Problems:  S/P CABG x 4: (LIMA to LAD, SVG to PL branch of RCA, Sequential SVG to OM1/2)  HYPERLIPIDEMIA  HYPERTENSION  RENAL INSUFFICIENCY, Cr 3.7  CEREBROVASCULAR ACCIDENT, HX OF CVA X 4  Gout  CAD, 3 vessel significant disease at cath this admission. for CABG this admission  Plan:  POD#6 CABG x 4, BP in the 150's, HR 90's. Clonidine increased to 0.2 TID per primary.   Possible DC in AM.   LOS: 11 days    HAGER,BRYAN W 04/07/2011 8:53 AM   Agree with note written by Jones Skene PAC  POD#6 CABG x 4. Looks great!! Ambulating w/o difficulty. Exam benign. Peripheral edema has resolved. . Labs OK, renal fxn stabble. No immediate plans for HD. Dr. Hyman Hopes is his Nephrologist. Will F/U in our office 2-3 weeks after D/C.  Runell Gess 04/07/2011 9:47 AM

## 2011-04-07 NOTE — Progress Notes (Addendum)
6 Days Post-Op Procedure(s) (LRB): CORONARY ARTERY BYPASS GRAFTING (CABG) (N/A) INSERTION OF DIALYSIS CATHETER (N/A)  Subjective: Patient just finished eating breakfast. Feels fairly well.  Objective: Vital signs in last 24 hours: Patient Vitals for the past 24 hrs:  BP Temp Temp src Pulse Resp SpO2 Weight  04/07/11 0500 158/80 mmHg 98.4 F (36.9 C) Oral 72  18  98 % 245 lb 12.8 oz (111.494 kg)  04/06/11 2050 151/77 mmHg 98.3 F (36.8 C) Oral 77  18  94 % -  04/06/11 1408 150/84 mmHg 97.9 F (36.6 C) Oral 65  16  95 % -   Pre op weight  117.5 kg Current Weight  04/07/11 245 lb 12.8 oz (111.494 kg)      Intake/Output from previous day: 01/20 0701 - 01/21 0700 In: 240 [P.O.:240] Out: 2300 [Urine:2300]   Physical Exam:  Cardiovascular: RRR, no murmurs, gallops, or rubs. Pulmonary: Slightly diminished at bases; no rales, wheezes, or rhonchi. Abdomen: Soft, non tender, bowel sounds present. Extremities: Trace bilateral lower extremity edema. Wounds: Clean and dry.  No erythema or signs of infection.  Lab Results: CBC: Basename 04/06/11 0603 04/05/11 0450  WBC 13.2* 14.3*  HGB 9.2* 8.4*  HCT 28.6* 25.5*  PLT 372 303   BMET:  Basename 04/06/11 0603 04/05/11 0450  NA 136 135  K 3.8 4.0  CL 99 98  CO2 25 27  GLUCOSE 147* 92  BUN 61* 58*  CREATININE 4.31* 4.38*  CALCIUM 8.8 9.1    PT/INR: No results found for this basename: LABPROT,INR in the last 72 hours ABG:  INR: Will add last result for INR, ABG once components are confirmed Will add last 4 CBG results once components are confirmed  Assessment/Plan:  1. CV - SR.SBP in the 150's.Will increase Catapres to 0.2 tid.Continue Lopressor 50 bid. 2.  Pulmonary - Encourage incentive spirometer. 3.Leukocytosis-WBC continues to decrease. Now down to 13.2.Remains afebrile. 4.  Acute blood loss anemia -H/H slightly increased this am to 9.2/28.6.Continue Nu Iron. 5.Chronic kidney disease- Creatinine slightly decreased  to 4.3. Per nephrology. 6.HGA1C pre op 5.8. BGS 121/142/96.No prior history of DM.Will stop scheduled insulin. Will need further surveillance as outpatient.   ZIMMERMAN,DONIELLE MPA-C 04/07/2011   patient examined and medical record reviewed,agree with above note.Plan DC in am if renal status remains at baseline ( creat 4.4) VAN TRIGT III,Joshaua Epple 04/07/2011

## 2011-04-07 NOTE — Progress Notes (Signed)
Subjective:  No new complaints, has been participating in cardiac rehab, hoping that he will be able to go home in a couple of days.     Objective Vital signs in last 24 hours: Filed Vitals:   04/06/11 0500 04/06/11 1408 04/06/11 2050 04/07/11 0500  BP: 132/85 150/84 151/77 158/80  Pulse: 73 65 77 72  Temp: 98.3 F (36.8 C) 97.9 F (36.6 C) 98.3 F (36.8 C) 98.4 F (36.9 C)  TempSrc: Oral Oral Oral Oral  Resp: 18 16 18 18   Height:      Weight: 113.5 kg (250 lb 3.6 oz)   111.494 kg (245 lb 12.8 oz)  SpO2: 96% 95% 94% 98%   Weight change: -2.006 kg (-4 lb 6.8 oz)  Intake/Output Summary (Last 24 hours) at 04/07/11 0833 Last data filed at 04/07/11 6578  Gross per 24 hour  Intake    240 ml  Output   2300 ml  Net  -2060 ml   Labs: Basic Metabolic Panel:  Lab 04/06/11 4696 04/05/11 0450 04/04/11 0411 04/01/11 0430  NA 136 135 133* --  K 3.8 4.0 4.3 --  CL 99 98 99 --  CO2 25 27 25  --  GLUCOSE 147* 92 102* --  BUN 61* 58* 52* --  CREATININE 4.31* 4.38* 4.26* --  CALCIUM 8.8 9.1 9.0 --  ALB -- -- -- --  PHOS -- -- -- 4.1   Liver Function Tests:  Lab 04/01/11 0430  AST --  ALT --  ALKPHOS --  BILITOT --  PROT --  ALBUMIN 2.8*   No results found for this basename: LIPASE:3,AMYLASE:3 in the last 168 hours No results found for this basename: AMMONIA:3 in the last 168 hours CBC:  Lab 04/06/11 0603 04/05/11 0450 04/04/11 0411 04/03/11 0445 04/02/11 1621  WBC 13.2* 14.3* 15.5* -- --  NEUTROABS -- -- -- -- --  HGB 9.2* 8.4* 8.3* -- --  HCT 28.6* 25.5* 25.6* -- --  MCV 89.4 88.2 88.6 87.9 86.0  PLT 372 303 259 -- --   Cardiac Enzymes: No results found for this basename: CKTOTAL:5,CKMB:5,CKMBINDEX:5,TROPONINI:5 in the last 168 hours CBG:  Lab 04/07/11 0600 04/06/11 2048 04/06/11 1610 04/06/11 1105 04/06/11 0619  GLUCAP 96 142* 121* 124* 150*    Iron Studies: No results found for this basename: IRON,TIBC,TRANSFERRIN,FERRITIN in the last 72  hours Studies/Results: No results found. Medications: Infusions:    Scheduled Medications:    . aspirin EC  325 mg Oral Daily  . cloNIDine  0.2 mg Oral TID  . docusate sodium  200 mg Oral Daily  . furosemide  40 mg Oral Daily  . insulin aspart  0-24 Units Subcutaneous TID AC & HS  . metoprolol tartrate  50 mg Oral BID  . pantoprazole  40 mg Oral QAC breakfast  . polysaccharide iron  150 mg Oral Daily  . rosuvastatin  20 mg Oral Daily  . sodium chloride  3 mL Intravenous Q12H  . terazosin  10 mg Oral QHS  . DISCONTD: cloNIDine  0.2 mg Oral BID  . DISCONTD: insulin glargine  20 Units Subcutaneous BID    have reviewed scheduled and prn medications.  Physical Exam: General: NAD, ate most of a big breakfast Heart: RRR Lungs: mostly clear, no wheezes Abdomen: soft, NT Extremities: no edema Dialysis Access: PC in place, never been used   I Assessment/ Plan: Pt is a 51 y.o. yo male who was admitted on 03/27/2011 with  ACS and CKD, now s/p CABG  Assessment/Plan: 1. Renal-  It appears that kidney function has taken a hit with the CABG surgery but is holding its own and has not yet passed the threshold of being HD requiring.  No labs today, dont know if absolutely needed, could check labs in AM in order to evaluate trend.  If no significant change in kidney function tomorrow, I think I would be comfortable with removing PC and arranging follow up as outpatient.   2. HTN/VOL- control is improving.  On clonidine, lopressor and lasix as well as hytrin.  Lasix has been decreased due to good UOP 3. Anemia- apparently given ESA this hosp, some ABL due to surgery 4. Secondary hyperparathyroidism- PTH has not been checked, phos either, can check in AM 5. Dispo- as above, if kidney function not drastically different tomorrow, would feel comfortable removing PC and making plans for D/C.  Wants to follow up with Dr. Hyman Hopes or Allena Katz which I will arrange prior to D/C  Maneh Sieben  A   04/07/2011,8:33 AM  LOS: 11 days

## 2011-04-07 NOTE — Progress Notes (Signed)
Noted orders for physical therapy were discontinued. We will need new orders to continue with therapy. Please re-order. Thanks! Ivonne Andrew PT, DPT [phone]

## 2011-04-07 NOTE — Progress Notes (Signed)
CSW advised of pt decision for home with home health. No other SW needs identified at this time, CSW signing off.  Baxter Flattery, MSW [phone]

## 2011-04-08 DIAGNOSIS — N186 End stage renal disease: Secondary | ICD-10-CM

## 2011-04-08 LAB — GLUCOSE, CAPILLARY
Glucose-Capillary: 150 mg/dL — ABNORMAL HIGH (ref 70–99)
Glucose-Capillary: 153 mg/dL — ABNORMAL HIGH (ref 70–99)

## 2011-04-08 LAB — RENAL FUNCTION PANEL
Albumin: 2.7 g/dL — ABNORMAL LOW (ref 3.5–5.2)
CO2: 23 mEq/L (ref 19–32)
Calcium: 9.2 mg/dL (ref 8.4–10.5)
Creatinine, Ser: 4.05 mg/dL — ABNORMAL HIGH (ref 0.50–1.35)
GFR calc Af Amer: 18 mL/min — ABNORMAL LOW (ref >90)
GFR calc non Af Amer: 16 mL/min — ABNORMAL LOW (ref >90)
Sodium: 135 mEq/L (ref 135–145)

## 2011-04-08 MED ORDER — FUROSEMIDE 40 MG PO TABS: 40.0000 mg | ORAL_TABLET | Freq: Every day | ORAL | Status: DC

## 2011-04-08 MED ORDER — FEBUXOSTAT 40 MG PO TABS: 80.0000 mg | ORAL_TABLET | Freq: Every day | ORAL | Status: DC

## 2011-04-08 MED ORDER — ASPIRIN 325 MG PO TBEC: 325.0000 mg | DELAYED_RELEASE_TABLET | Freq: Every day | ORAL | Status: DC

## 2011-04-08 MED ORDER — FEBUXOSTAT 40 MG PO TABS: 40.0000 mg | ORAL_TABLET | Freq: Every day | ORAL | Status: DC

## 2011-04-08 NOTE — Progress Notes (Signed)
[facility] Progress/ procedure note  VYRON FRONCZAK is a 51 y.o. male had a left IJ diatek catheter placed by Dr. Imogene Burn at the same time as his CABG. This catheter has not been used and is no longer needed as pt. Renal function is stable and slightly improved. We were asked to take catheter out.   [facility] Catheter Removal Procedure Note  Diagnosis: ESRD with Functioning AVF/AVGG  Plan:  Remove left diatek catheter  Consent signed:  yes Time out completed:  yes Coumadin:  no PT/INR (if applicable):   Other labs:   Procedure: 1.  Sterile prepping and draping over catheter area 2. 8 ml 2% lidocaine plain instilled at removal site. 3.  left catheter removed in its entirety with cuff in tact. 4.  Complications: none  5. Tip of catheter sent for culture:  no   Patient tolerated procedure well:  yes Pressure held, no bleeding noted, dressing applied Instructions given to the pt regarding wound care and bleeding.  Other:  Marlowe Shores 04/08/2011 10:48 AM

## 2011-04-08 NOTE — Progress Notes (Signed)
Patient will have follow up with Dr. Allena Katz at [facility] on February 25 at 2 PM.  Patient aware.

## 2011-04-08 NOTE — Progress Notes (Signed)
Pulled patients EPW & CTS per MD order and hospital policy.  All ends intact.  No bleeding.  Patient tolerated well.  Vitals started.  Bedrest for one hour.  Will continue to monitor.

## 2011-04-08 NOTE — Progress Notes (Signed)
7 Days Post-Op Procedure(s) (LRB): CORONARY ARTERY BYPASS GRAFTING (CABG) (N/A) INSERTION OF DIALYSIS CATHETER (N/A)  Subjective: Patient with some nausea and emesis once this am.Denies abdominal pain.  Objective: Vital signs in last 24 hours: Patient Vitals for the past 24 hrs:  BP Temp Temp src Pulse Resp SpO2 Weight  04/08/11 0517 155/91 mmHg 98.1 F (36.7 C) Oral 63  19  94 % 244 lb 11.4 oz (111 kg)  04/07/11 2038 148/87 mmHg 98.1 F (36.7 C) Oral 69  20  96 % -  04/07/11 1401 133/81 mmHg 98.1 F (36.7 C) Oral 64  20  95 % -   Pre op weight  117.5 kg Current Weight  04/08/11 244 lb 11.4 oz (111 kg)      Intake/Output from previous day: 01/21 0701 - 01/22 0700 In: 1320 [P.O.:1320] Out: 2600 [Urine:2600]   Physical Exam:  Cardiovascular: RRR, no murmurs, gallops, or rubs. Pulmonary: Slightly diminished at bases; no rales, wheezes, or rhonchi. Abdomen: Soft, non tender, bowel sounds present. Extremities: Trace bilateral lower extremity edema. Wounds: Clean and dry.  No erythema or signs of infection.  Lab Results: CBC:  Basename 04/06/11 0603  WBC 13.2*  HGB 9.2*  HCT 28.6*  PLT 372   BMET:   Basename 04/08/11 0600 04/06/11 0603  NA 135 136  K 4.0 3.8  CL 99 99  CO2 23 25  GLUCOSE 142* 147*  BUN 60* 61*  CREATININE 4.05* 4.31*  CALCIUM 9.2 8.8    PT/INR: No results found for this basename: LABPROT,INR in the last 72 hours ABG:  INR: Will add last result for INR, ABG once components are confirmed Will add last 4 CBG results once components are confirmed  Assessment/Plan:  1. CV - SR.SBP still in the 150's.Continue Catapres to 0.2 tid and  Lopressor 50 bid. 2.  Pulmonary - Encourage incentive spirometer. 3.Leukocytosis-WBC continues to decrease. Now down to 13.2.Remains afebrile. 4.  Acute blood loss anemia -Last H/H   9.2/28.6.Continue Nu Iron. 5.Chronic kidney disease- Creatinine  decreased to 4.05. Per nephrology, remove Diatek and follow up as  outpatient. 6.HGA1C pre op 5.8. CBGS 98/130/150.No prior history of DM.Will stop scheduled insulin. Will need further surveillance as outpatient. 7.Remove EPW and CT sutures. 7.Will likely discharge later today, if nausea resolved.   ZIMMERMAN,DONIELLE MPA-C 04/08/2011

## 2011-04-08 NOTE — Discharge Summary (Signed)
Physician Discharge Summary  Patient ID: [name] MRN: [id number] DOB/AGE: Jan 14, 1961 51 y.o.  Admit date: 03/27/2011 Discharge date: 04/08/2011  Admission Diagnoses: 1. NSTEMI 2.Multivessel CAD 3.History of hypertension 4.History of hyperlipidemia 5.History of renal insufficiency 6.History of CVA x 4 7.History of gout 8.History of chronic anemia 9.History of sleep apnea 10.History of achalasia (s/p myotomy)  Discharge Diagnoses:  1. NSTEMI 2.Multivessel CAD 3.History of hypertension 4.History of hyperlipidemia 5.History of renal insufficiency 6.History of CVA x 4 7.History of gout 8.History of chronic anemia 9.History of sleep apnea 10.History of achalasia (s/p myotomy)  Procedure (s):  1.Cardiac catheterization done on 03/28/2011 by Dr. Tresa Endo 2. Left internal jugular vein tunneled dialysis catheter placement;  Left internal jugular vein cannulation under ultrasound guidance by Dr. Imogene Burn 04/01/2011 3. Coronary artery bypass grafting x4 (left internal mammary artery to  left anterior descending , saphenous vein graft to posterolateral  branch of right coronary, sequential saphenous vein graft to obtuse  marginal 1 and obtuse marginal 2) with endoscopic harvest of right leg greater saphenous vein by Dr. Donata Clay on 04/01/2011.   History of Presenting Illness: This is a 51 year-old [place] American male accepted on transfer from [facility] with a diagnosis of NSTEMI. He was relaxing and watching TV when he became acutely SOB. The SOB sx was then  followed by SSCP sx described as a pressure without radiation. He rated the CP 8/10 with some diaphoresis noted. He denied any nausea,  near syncopal events, exercise intolerance, PND, cough and or pillow orthopnea . He is a non smoker and social drinker. His known ACS risk factors include HLD, CKD, prior CVA . He denied a  h/o of DM, thyroid d/z, or valvular heart d/z. Pt gives h/o of negative ETT performed at [facility]  in 01/2011. His cardiac enzymes were positive and his Troponin I peaked at 24.2. He has a history of renal insufficiency (baseline creatinine 3.8). Dr. Allena Katz (nephrology) was consulted and followed him preoperatively and postoperatively. A 2-D echo showed normal LV function without significant valve disease. He then underwent cardiac cath on 03/28/2011. Results showed severe,diffuse multivessel disease. LVEDP was 15 and the LIMA injection was widely patent. A cardiothoracic consultation was then obtained with Dr. Zenaida Niece to right for the consideration of coronary artery bypass grafting surgery. Potential benefits, risks, and complications were discussed with the patient and he agreed to proceed. His preoperative carotid duplex carotid ultrasound showed no evidence of significant bilateral internal carotid artery stenosis and his ABIs were 1.39 on the right a 1.25 on the left. He underwent insertion of a left IJ dialysis catheter and CABG x4 on 04/01/2011.  Brief Hospital Course:  He was extubated the morning of postop day one without difficulty.He remained afebrile and hemodynamically stable. His Swan-Ganz, A-line, chest tubes, and Foley were all removed early in his post operative course. He continued to have good urine output. He was volume overloaded and diuresed with careful monitoring of his creatinine. He was started on a low-dose beta blocker. He was found to be anemic. His H&H was low as 8.3 and 25.6 respectively. He did not require postoperative transfusion was placed on Aranesp and Nu Iron. He continued to progress with cardiac rehabilitation. He was felt surgically stable for transfer from the intensive care unit to [facility] for further convalescence on 04/06/2011. He did develop nausea and had one episode of emesis today, but denied abdominal pain. His nausea was relieved with Zofran any apparent further complaints of any  nausea or emesis around once time. His epicardial pacing wires and chest tube sutures  were removed. Driven tolerating a diet has had a bowel movement. Provided he remains afebrile, and hemodynamically stable, he was surgically stable for discharge later today.  Filed Vitals:   04/08/11 1101  BP: 125/83  Pulse: 61  Temp:   Resp: 20     Latest Vital Signs: Blood pressure 125/83, pulse 61, temperature 98.1 F (36.7 C), temperature source Oral, resp. rate 20, height 6\' 1"  (1.854 m), weight 244 lb 11.4 oz (111 kg), SpO2 94.00%.  Physical Exam:  Cardiovascular: RRR, no murmurs, gallops, or rubs.  Pulmonary: Slightly diminished at bases; no rales, wheezes, or rhonchi.  Abdomen: Soft, non tender, bowel sounds present.  Extremities: Trace bilateral lower extremity edema.  Wounds: Clean and dry. No erythema or signs of infection.   Discharge Condition:Stable and improved  Recent laboratory studies:  Lab Results  Component Value Date   WBC 13.2* 04/06/2011   HGB 9.2* 04/06/2011   HCT 28.6* 04/06/2011   MCV 89.4 04/06/2011   PLT 372 04/06/2011   Lab Results  Component Value Date   NA 135 04/08/2011   K 4.0 04/08/2011   CL 99 04/08/2011   CO2 23 04/08/2011   CREATININE 4.05* 04/08/2011   GLUCOSE 142* 04/08/2011      Diagnostic Studies: Dg Chest 2 View  04/05/2011  *RADIOLOGY REPORT*  Clinical Data: CABG.  CHEST - 2 VIEW  Comparison: 04/04/2011.  Findings: Prior CABG.  Support devices are unchanged.  Stable cardiomegaly.  No confluent airspace opacity or effusion.  IMPRESSION: Cardiomegaly.  No acute findings.  Original Report Authenticated By: Cyndie Chime, M.D.    Discharge Orders    Future Appointments: Provider: Department: Dept Phone: Center:   04/30/2011 9:30 AM Mikey Bussing, MD [facility] [phone] [facility]      Discharge Medications: Medication List  As of 04/08/2011 11:24 AM   STOP taking these medications         aliskiren 300 MG tablet      aspirin 325 MG tablet      AZOR 10-40 MG per tablet            indomethacin 25 MG capsule          TAKE these medications         aspirin 325 MG EC tablet   Take 1 tablet (325 mg total) by mouth daily.      cloNIDine 0.2 MG tablet   Commonly known as: CATAPRES   Take 0.2 mg by mouth 3 (three) times daily.      colchicine 0.6 MG tablet   Take 0.6 mg by mouth daily as needed. For gout flare-ups      furosemide 40 MG tablet   Commonly known as: LASIX   Take 1 tablet (40 mg total) by mouth daily.      metoprolol 50 MG tablet   Commonly known as: LOPRESSOR   Take 1 tablet (50 mg total) by mouth 2 (two) times daily.      omega-3 acid ethyl esters 1 G capsule   Commonly known as: LOVAZA   Take 1 g by mouth 3 (three) times daily before meals.      Oxycodone HCl 10 MG Tabs   Take 1 tablet (10 mg total) by mouth every 4 (four) hours as needed.      polysaccharide iron 150 MG Caps capsule   Commonly known as: NIFEREX   Take  1 capsule (150 mg total) by mouth daily.      rosuvastatin 20 MG tablet   Commonly known as: CRESTOR   Take 20 mg by mouth at bedtime.      terazosin 10 MG capsule   Commonly known as: HYTRIN   Take 1 capsule (10 mg total) by mouth at bedtime.              febuxostat 40 MG tablet              Commonly known as: Uloric         Take 80 mg po daily for gout.  Please note he was not discharged on an ACE or ARB secondary to renal insufficiency.  Follow Up Appointments: Follow-up Information    Follow up with VAN Dinah Beers, MD. (04/30/11 at 9:30 am; obtain Chest xray at 9:00 at [facility] imaging in the same office complex)    Contact information:   [address] [place] [address] [phone]       Follow up with Runell Gess, MD on 04/28/2011. (@ 2:45  pm at [facility]  above [employer] at [facility] information:   [address] [place] [address] [phone]       Follow up with Dagoberto Ligas., MD. (see Dr Allena Katz or Hyman Hopes as directed by them for follow  up for kidneys)    Contact information:   [address] [facility] [place] [address] [phone]       Follow up with Oliver Barre, MD. (Call for follow up appointment regarding HGA1C 5.8)    Contact information:   [address] [facility] [address] [place] [place] [address] [phone]          SignedDoree Fudge MPA-C 04/08/2011, 11:24 AM

## 2011-04-08 NOTE — Progress Notes (Signed)
Cardiac Rehab 04/08/11  [id number] Pt nauseated. Education completed in case he is d/ced later. Permission given to refer to [facility]  Phase 2.  Pt to walk later when he feels better. Amare Bail DunlapRN

## 2011-04-08 NOTE — Progress Notes (Signed)
Subjective:  Nauseated this AM, is first day this has happened.  Otherwise, no problems  Objective Vital signs in last 24 hours: Filed Vitals:   04/07/11 0500 04/07/11 1401 04/07/11 2038 04/08/11 0517  BP: 158/80 133/81 148/87 155/91  Pulse: 72 64 69 63  Temp: 98.4 F (36.9 C) 98.1 F (36.7 C) 98.1 F (36.7 C) 98.1 F (36.7 C)  TempSrc: Oral Oral Oral Oral  Resp: 18 20 20 19   Height:      Weight: 111.494 kg (245 lb 12.8 oz)   111 kg (244 lb 11.4 oz)  SpO2: 98% 95% 96% 94%   Weight change: -0.494 kg (-1 lb 1.4 oz)  Intake/Output Summary (Last 24 hours) at 04/08/11 0830 Last data filed at 04/08/11 0555  Gross per 24 hour  Intake   1320 ml  Output   2600 ml  Net  -1280 ml   Labs: Basic Metabolic Panel:  Lab 04/08/11 4098 04/06/11 0603 04/05/11 0450  NA 135 136 135  K 4.0 3.8 4.0  CL 99 99 98  CO2 23 25 27   GLUCOSE 142* 147* 92  BUN 60* 61* 58*  CREATININE 4.05* 4.31* 4.38*  CALCIUM 9.2 8.8 9.1  ALB -- -- --  PHOS 4.1 -- --   Liver Function Tests:  Lab 04/08/11 0600  AST --  ALT --  ALKPHOS --  BILITOT --  PROT --  ALBUMIN 2.7*   No results found for this basename: LIPASE:3,AMYLASE:3 in the last 168 hours No results found for this basename: AMMONIA:3 in the last 168 hours CBC:  Lab 04/06/11 0603 04/05/11 0450 04/04/11 0411 04/03/11 0445 04/02/11 1621  WBC 13.2* 14.3* 15.5* -- --  NEUTROABS -- -- -- -- --  HGB 9.2* 8.4* 8.3* -- --  HCT 28.6* 25.5* 25.6* -- --  MCV 89.4 88.2 88.6 87.9 86.0  PLT 372 303 259 -- --   Cardiac Enzymes: No results found for this basename: CKTOTAL:5,CKMB:5,CKMBINDEX:5,TROPONINI:5 in the last 168 hours CBG:  Lab 04/08/11 0628 04/07/11 2036 04/07/11 1653 04/07/11 1113 04/07/11 0600  GLUCAP 150* 130* 98 143* 96    Iron Studies: No results found for this basename: IRON,TIBC,TRANSFERRIN,FERRITIN in the last 72 hours Studies/Results: No results found. Medications: Infusions:    Scheduled Medications:    . aspirin EC   325 mg Oral Daily  . cloNIDine  0.2 mg Oral TID  . docusate sodium  200 mg Oral Daily  . furosemide  40 mg Oral Daily  . insulin aspart  0-24 Units Subcutaneous TID AC & HS  . metoprolol tartrate  50 mg Oral BID  . pantoprazole  40 mg Oral QAC breakfast  . polysaccharide iron  150 mg Oral Daily  . rosuvastatin  20 mg Oral Daily  . sodium chloride  3 mL Intravenous Q12H  . terazosin  10 mg Oral QHS    have reviewed scheduled and prn medications.  Physical Exam: General: NAD, ate a little breakfast Heart: RRR Lungs: mostly clear, no wheezes Abdomen: soft, NT Extremities: no edema Dialysis Access: PC in place, never been used   I Assessment/ Plan: Pt is a 51 y.o. yo male who was admitted on 03/27/2011 with  ACS and CKD, now s/p CABG  Assessment/Plan: 1. Renal-  It appears that kidney function has taken a hit with the CABG surgery but is holding its own and actually has improved some today.  There have been no indications for HD.  I feel  comfortable with removing PC and arranging  follow up as outpatient.   2. HTN/VOL- control is improving.  On clonidine, lopressor and lasix as well as hytrin.  Lasix has been decreased due to good UOP, would send out on lasix 40 daily 3. Anemia- apparently given ESA this hosp, some ABL due to surgery.  Will continue to follow as OP 4. Secondary hyperparathyroidism- PTH and phos pending, will follow as OP 5. Dispo- as above, I feel comfortable with PC removal and am OK with patients discharge from hospital.  Wants to follow up with Dr. Hyman Hopes or Allena Katz which I will arrange prior to D/C  Clotilda Hafer A   04/08/2011,8:30 AM  LOS: 12 days

## 2011-04-11 NOTE — Discharge Summary (Signed)
patient examined and medical record reviewed,agree with above note. VAN TRIGT III,Lanetra Hartley 04/11/2011    

## 2011-04-21 MED FILL — Lidocaine HCl IV Inj 20 MG/ML: INTRAVENOUS | Qty: 10 | Status: AC

## 2011-04-21 MED FILL — Mannitol IV Soln 20%: INTRAVENOUS | Qty: 500 | Status: AC

## 2011-04-21 MED FILL — Sodium Chloride IV Soln 0.9%: INTRAVENOUS | Qty: 1000 | Status: AC

## 2011-04-21 MED FILL — Heparin Sodium (Porcine) Inj 1000 Unit/ML: INTRAMUSCULAR | Qty: 70 | Status: AC

## 2011-04-21 MED FILL — Electrolyte-R (PH 7.4) Solution: INTRAVENOUS | Qty: 3000 | Status: AC

## 2011-04-21 MED FILL — Sodium Bicarbonate IV Soln 8.4%: INTRAVENOUS | Qty: 50 | Status: AC

## 2011-04-21 MED FILL — Sodium Chloride Irrigation Soln 0.9%: Qty: 3000 | Status: AC

## 2011-04-24 ENCOUNTER — Other Ambulatory Visit: Payer: Self-pay | Admitting: Cardiothoracic Surgery

## 2011-04-24 DIAGNOSIS — I2510 Atherosclerotic heart disease of native coronary artery without angina pectoris: Secondary | ICD-10-CM

## 2011-04-28 DIAGNOSIS — I10 Essential (primary) hypertension: Secondary | ICD-10-CM | POA: Diagnosis not present

## 2011-04-28 DIAGNOSIS — E782 Mixed hyperlipidemia: Secondary | ICD-10-CM | POA: Diagnosis not present

## 2011-04-28 DIAGNOSIS — I6789 Other cerebrovascular disease: Secondary | ICD-10-CM | POA: Diagnosis not present

## 2011-04-28 DIAGNOSIS — I2510 Atherosclerotic heart disease of native coronary artery without angina pectoris: Secondary | ICD-10-CM | POA: Diagnosis not present

## 2011-04-30 ENCOUNTER — Ambulatory Visit (INDEPENDENT_AMBULATORY_CARE_PROVIDER_SITE_OTHER): Payer: Self-pay | Admitting: Cardiothoracic Surgery

## 2011-04-30 ENCOUNTER — Ambulatory Visit
Admission: RE | Admit: 2011-04-30 | Discharge: 2011-04-30 | Disposition: A | Discharge status: Home / Self Care | Payer: Medicare Other | Source: Ambulatory Visit | Attending: Cardiothoracic Surgery | Admitting: Cardiothoracic Surgery

## 2011-04-30 VITALS — BP 140/80 | HR 57 | Resp 16 | Ht 72.5 in | Wt 248.0 lb

## 2011-04-30 DIAGNOSIS — Z951 Presence of aortocoronary bypass graft: Secondary | ICD-10-CM

## 2011-04-30 DIAGNOSIS — I2510 Atherosclerotic heart disease of native coronary artery without angina pectoris: Secondary | ICD-10-CM

## 2011-04-30 DIAGNOSIS — Z4682 Encounter for fitting and adjustment of non-vascular catheter: Secondary | ICD-10-CM | POA: Diagnosis not present

## 2011-04-30 NOTE — Progress Notes (Signed)
Patient ID: [name], male   DOB: November 24, 1960, 51 y.o.   MRN: [id number] The patient returns for his first postop visit after CABG x4 January 15. He has chronic renal insufficiency but did not require hemodialysis after surgery. The patient has done well at home and has maintained sinus rhythm without symptoms of recurrent angina or CHF. The surgical incisions are well healed. PA and lateral chest x-ray today is clear.  On exam blood pressure 157/90 pulse 60 regular weight 248 saturation 98% room air Lungs clear, cardiac rhythm regular, no peripheral edema Sternal incision clean and dry and stable  Chest x-ray today shows clear lung fields no pleural effusion sternal wires intact IMpression plan--stable course following CABG x4 one month ago. He can resume driving and lifting up to 20 pounds. He will continue his current medications under the direction of Dr. Corrie Dandy. He should not travel back to [place] until mid March. He should continue indefinitely 1 aspirin and a statin medication.  Plan return as needed

## 2011-05-01 ENCOUNTER — Encounter (HOSPITAL_COMMUNITY)
Admission: RE | Admit: 2011-05-01 | Discharge: 2011-05-01 | Disposition: A | Discharge status: Home / Self Care | Payer: Medicare Other | Source: Ambulatory Visit | Attending: Cardiovascular Disease | Admitting: Cardiovascular Disease

## 2011-05-01 ENCOUNTER — Encounter (HOSPITAL_COMMUNITY): Payer: Self-pay

## 2011-05-01 DIAGNOSIS — Z8673 Personal history of transient ischemic attack (TIA), and cerebral infarction without residual deficits: Secondary | ICD-10-CM | POA: Insufficient documentation

## 2011-05-01 DIAGNOSIS — Z5189 Encounter for other specified aftercare: Secondary | ICD-10-CM | POA: Insufficient documentation

## 2011-05-01 DIAGNOSIS — M19019 Primary osteoarthritis, unspecified shoulder: Secondary | ICD-10-CM | POA: Insufficient documentation

## 2011-05-01 DIAGNOSIS — Z9861 Coronary angioplasty status: Secondary | ICD-10-CM | POA: Insufficient documentation

## 2011-05-01 DIAGNOSIS — Z7982 Long term (current) use of aspirin: Secondary | ICD-10-CM | POA: Insufficient documentation

## 2011-05-01 DIAGNOSIS — E785 Hyperlipidemia, unspecified: Secondary | ICD-10-CM | POA: Insufficient documentation

## 2011-05-01 DIAGNOSIS — I120 Hypertensive chronic kidney disease with stage 5 chronic kidney disease or end stage renal disease: Secondary | ICD-10-CM | POA: Insufficient documentation

## 2011-05-01 DIAGNOSIS — Z823 Family history of stroke: Secondary | ICD-10-CM | POA: Insufficient documentation

## 2011-05-01 DIAGNOSIS — I2510 Atherosclerotic heart disease of native coronary artery without angina pectoris: Secondary | ICD-10-CM | POA: Insufficient documentation

## 2011-05-01 DIAGNOSIS — Z87891 Personal history of nicotine dependence: Secondary | ICD-10-CM | POA: Insufficient documentation

## 2011-05-01 DIAGNOSIS — N186 End stage renal disease: Secondary | ICD-10-CM | POA: Insufficient documentation

## 2011-05-01 DIAGNOSIS — I214 Non-ST elevation (NSTEMI) myocardial infarction: Secondary | ICD-10-CM | POA: Insufficient documentation

## 2011-05-01 NOTE — Progress Notes (Signed)
Cardiac Rehab Medication Review  Does the patient  feel that his/her medications are working for him/her?  yes  Has the patient been experiencing any side effects to the medications prescribed?  Some tiredness with lopressor  Does the patient measure his/her own blood pressure or blood glucose at home?  Checks blood pressure at home; reports AM reading was 126/82 mmHg   Does the patient have any problems obtaining medications due to transportation or finances?   Has some complaints with medications costs; does not have prescription insurance coverage.  Understanding of regimen: good Understanding of indications: fair Potential of compliance: fair  Pharmacist comments: Patient does not have prescription insurance coverage so many of his medications are Brand Only, which is an issue for the patient. For the time being recommended that he-adherence as he reports not taking lasix due to not being able to afford the medication. He says he will follow-up with Dr. Allena Katz (nephrologist) to see about need to continue furosemide.   Benjaman Pott, PharmD   05/01/2011   9:03 AM

## 2011-05-05 ENCOUNTER — Encounter (HOSPITAL_COMMUNITY): Payer: Self-pay

## 2011-05-05 ENCOUNTER — Encounter (HOSPITAL_COMMUNITY)
Admission: RE | Admit: 2011-05-05 | Discharge: 2011-05-05 | Disposition: A | Discharge status: Home / Self Care | Payer: Medicare Other | Source: Ambulatory Visit | Attending: Cardiovascular Disease | Admitting: Cardiovascular Disease

## 2011-05-05 DIAGNOSIS — Z823 Family history of stroke: Secondary | ICD-10-CM | POA: Diagnosis not present

## 2011-05-05 DIAGNOSIS — M19019 Primary osteoarthritis, unspecified shoulder: Secondary | ICD-10-CM | POA: Diagnosis not present

## 2011-05-05 DIAGNOSIS — Z9861 Coronary angioplasty status: Secondary | ICD-10-CM | POA: Diagnosis not present

## 2011-05-05 DIAGNOSIS — I120 Hypertensive chronic kidney disease with stage 5 chronic kidney disease or end stage renal disease: Secondary | ICD-10-CM | POA: Diagnosis not present

## 2011-05-05 DIAGNOSIS — Z7982 Long term (current) use of aspirin: Secondary | ICD-10-CM | POA: Diagnosis not present

## 2011-05-05 DIAGNOSIS — I2510 Atherosclerotic heart disease of native coronary artery without angina pectoris: Secondary | ICD-10-CM | POA: Diagnosis not present

## 2011-05-05 DIAGNOSIS — Z87891 Personal history of nicotine dependence: Secondary | ICD-10-CM | POA: Diagnosis not present

## 2011-05-05 DIAGNOSIS — N186 End stage renal disease: Secondary | ICD-10-CM | POA: Diagnosis not present

## 2011-05-05 DIAGNOSIS — E785 Hyperlipidemia, unspecified: Secondary | ICD-10-CM | POA: Diagnosis not present

## 2011-05-05 DIAGNOSIS — I214 Non-ST elevation (NSTEMI) myocardial infarction: Secondary | ICD-10-CM | POA: Diagnosis not present

## 2011-05-05 DIAGNOSIS — Z8673 Personal history of transient ischemic attack (TIA), and cerebral infarction without residual deficits: Secondary | ICD-10-CM | POA: Diagnosis not present

## 2011-05-05 DIAGNOSIS — Z5189 Encounter for other specified aftercare: Secondary | ICD-10-CM | POA: Diagnosis not present

## 2011-05-05 NOTE — Progress Notes (Addendum)
Pt started cardiac rehab today.  Pt tolerated light exercise without difficulty. Pt hypotensive post exercise -88/60 with leg weakness.  Pt admits to taking extra clonidine at 430 am today for reported home BP -170/130.   Pt states he then took regular 7am dose of clonidine as scheduled.  Pt reports he has not taken lasix 40mg  per DC instructions as he has had difficulty with incontinence associated with in the past.  Pt also reports he previously was not taking metoprolol 50mg  BID as he does not like that it causes fatigue and day sleepiness.  Pt states he has resumed the metoprolol with relief of these symptoms.  Pt states he has not discussed these sx with Dr. Allyson Sabal, pt advised if sx resume to make Dr. Allyson Sabal aware.  Pt advised to bring home BP cuff to rehab on next visit to evaluate accuracy.  Phone call to nephrologist Dr. Allena Katz to confirm if pt should resume lasix per DC instructions.  Pt has hospital f/u with Dr. Allena Katz 05/12/2011.  Pt exercised without dyspnea, chest pain, dizziness or lightheadedness.  Telemetry-NSR, T wave inversion.  Also discussed sleep apnea with pt .  Pt reports he was previously diagnosed with sleep apnea however unable to afford CPAP therapy.  Advised pt to reconsider seeking CPAP therapy if appropriate to decrease risk factors and improve quality of sleep.   Pt oriented to exercise equipment and routine.  Understanding verbalized. Will continue to monitor the patient throughout  the program. Addendum: spoke to Arna Medici at [facility] [phone].  Per Dr.Patel, she spoke to pt and advised him to restart lasix 40mg  once daily.  Pt verbalized understanding-jr,rn

## 2011-05-07 ENCOUNTER — Encounter (HOSPITAL_COMMUNITY)
Admission: RE | Admit: 2011-05-07 | Discharge: 2011-05-07 | Disposition: A | Discharge status: Home / Self Care | Payer: Medicare Other | Source: Ambulatory Visit | Attending: Cardiovascular Disease | Admitting: Cardiovascular Disease

## 2011-05-07 NOTE — Progress Notes (Signed)
[name] 51 y.o. male       Nutrition Screen                                                                    YES  NO Do you live in a nursing home?  X   Do you eat out more than 3 times/week?    X If yes, how many times per week do you eat out? 2 times a month  Do you have food allergies?  X  If yes, what are you allergic to? Shrimp  Have you gained or lost more than 10 lbs without trying?               X If yes, how much weight have you lost and over what time period?  lbs gained or lost over  weeks/month  Do you want to lose weight?    X  If yes, what is a goal weight or amount of weight you would like to lose?  lb  Do you eat alone most of the time?   X   Do you eat less than 2 meals/day?  X If yes, how many meals do you eat?  Do you drink more than 3 alcohol drinks/day?  X If yes, how many drinks per day?  Are you having trouble with constipation? *  X If yes, what are you doing to help relieve constipation?  Do you have financial difficulties with buying food?*    X   Are you experiencing regular nausea/ vomiting?*     X   Do you have a poor appetite? *                                        X   Do you have trouble chewing/swallowing? *   X    Pt with diagnoses of:  X CABG              X GERD          X Dyslipidemia  / HDL< 40 / LDL>70 / High TG      X %  Body fat >goal / Body Mass Index >25 X HTN / BP >120/80 X MI X Pre-diabetes X Gout           Pt Risk Score   2       Diagnosis Risk Score  40       Total Risk Score   42                        X High Risk                Low Risk    HT: 72" Ht Readings from Last 1 Encounters:  05/01/11 6' (1.829 m)    WT:   246.4 lb (112 kg) Wt Readings from Last 3 Encounters:  05/01/11 246 lb 14.6 oz (112 kg)  04/30/11 248 lb (112.492 kg)  04/08/11 244 lb 11.4 oz (111 kg)     IBW 80.9 138%IBW BMI 33.5 34.9%body fat  Meds reviewed: Fish oil, Niferex Past Medical History  Diagnosis Date  . ACHALASIA   . ANEMIA-NOS     . CEREBROVASCULAR ACCIDENT, HX OF   . DEGENERATIVE JOINT DISEASE, SHOULDER   . DEPRESSION   . GERD   . Headache   . HYPERLIPIDEMIA   . HYPERTENSION   . RENAL CALCULUS, HX OF   . RENAL INSUFFICIENCY   . Stroke   . Shortness of breath   . Sleep apnea   . H/O hiatal hernia   . Seizures   . CAD (coronary artery disease), 3 vessel significant disease.  03/28/2011        Activity level: Pt is moderately active  Wt goal: 222-234 lb ( 100.9-106.4 kg) Current tobacco use? No      Food/Drug Interaction? No       Labs:  Lipid Panel - no recent 06/23/08 lipids reviewed.  Lab Results  Component Value Date   HGBA1C 5.8* 04/01/2011   04/08/11 Glucose 153  LDL goal: < 70      MI, PVD and > 2:      HTN, family h/o, > 51 yo male Estimated Daily Nutrition Needs for: ? wt loss  1800-2300 Kcal , Total Fat 50-65gm, Saturated Fat 13-18 gm, Trans Fat 1.8-2.3 gm,  Sodium less than 1500 mg

## 2011-05-07 NOTE — Progress Notes (Signed)
Pt brought in BP cuff (wrist automatic) from home.  Pt cuff 120/83 right wrist, manual 116/82 right arm.  Pt reassured.  Pt states he resumed taking Lasix 40mg  once daily yesterday.  Pt states he woke up hypertensive this am, however got up, ate breakfast then took am meds rather than taking additional dose of clonidine.  VSS at cardiac rehab today.  Will continue to [facility]

## 2011-05-09 ENCOUNTER — Encounter (HOSPITAL_COMMUNITY)
Admission: RE | Admit: 2011-05-09 | Discharge: 2011-05-09 | Disposition: A | Discharge status: Home / Self Care | Payer: Medicare Other | Source: Ambulatory Visit | Attending: Cardiovascular Disease | Admitting: Cardiovascular Disease

## 2011-05-10 ENCOUNTER — Encounter: Payer: Self-pay | Admitting: Internal Medicine

## 2011-05-10 DIAGNOSIS — Z0000 Encounter for general adult medical examination without abnormal findings: Secondary | ICD-10-CM | POA: Insufficient documentation

## 2011-05-12 ENCOUNTER — Encounter (HOSPITAL_COMMUNITY): Payer: Medicare Other

## 2011-05-12 DIAGNOSIS — I129 Hypertensive chronic kidney disease with stage 1 through stage 4 chronic kidney disease, or unspecified chronic kidney disease: Secondary | ICD-10-CM | POA: Diagnosis not present

## 2011-05-12 DIAGNOSIS — N184 Chronic kidney disease, stage 4 (severe): Secondary | ICD-10-CM | POA: Diagnosis not present

## 2011-05-12 DIAGNOSIS — D649 Anemia, unspecified: Secondary | ICD-10-CM | POA: Diagnosis not present

## 2011-05-12 DIAGNOSIS — N2581 Secondary hyperparathyroidism of renal origin: Secondary | ICD-10-CM | POA: Diagnosis not present

## 2011-05-13 ENCOUNTER — Encounter: Payer: Self-pay | Admitting: Internal Medicine

## 2011-05-13 ENCOUNTER — Ambulatory Visit (INDEPENDENT_AMBULATORY_CARE_PROVIDER_SITE_OTHER): Payer: Medicare Other | Admitting: Internal Medicine

## 2011-05-13 ENCOUNTER — Other Ambulatory Visit (INDEPENDENT_AMBULATORY_CARE_PROVIDER_SITE_OTHER): Payer: Medicare Other

## 2011-05-13 ENCOUNTER — Ambulatory Visit: Payer: Medicare Other | Admitting: Internal Medicine

## 2011-05-13 VITALS — BP 132/94 | HR 55 | Temp 98.4°F | Ht 72.0 in | Wt 244.8 lb

## 2011-05-13 DIAGNOSIS — I10 Essential (primary) hypertension: Secondary | ICD-10-CM

## 2011-05-13 DIAGNOSIS — D649 Anemia, unspecified: Secondary | ICD-10-CM

## 2011-05-13 DIAGNOSIS — R7309 Other abnormal glucose: Secondary | ICD-10-CM | POA: Diagnosis not present

## 2011-05-13 DIAGNOSIS — N320 Bladder-neck obstruction: Secondary | ICD-10-CM | POA: Diagnosis not present

## 2011-05-13 DIAGNOSIS — R7302 Impaired glucose tolerance (oral): Secondary | ICD-10-CM

## 2011-05-13 DIAGNOSIS — E785 Hyperlipidemia, unspecified: Secondary | ICD-10-CM | POA: Diagnosis not present

## 2011-05-13 LAB — CBC WITH DIFFERENTIAL/PLATELET
Basophils Relative: 0.7 % (ref 0.0–3.0)
Eosinophils Relative: 6.1 % — ABNORMAL HIGH (ref 0.0–5.0)
Lymphocytes Relative: 22.3 % (ref 12.0–46.0)
Monocytes Relative: 7.6 % (ref 3.0–12.0)
Neutrophils Relative %: 63.3 % (ref 43.0–77.0)
RBC: 4.18 Mil/uL — ABNORMAL LOW (ref 4.22–5.81)
WBC: 7.4 10*3/uL (ref 4.5–10.5)

## 2011-05-13 MED ORDER — COLCHICINE 0.6 MG PO TABS: 0.6000 mg | ORAL_TABLET | Freq: Every day | ORAL | Status: DC | PRN

## 2011-05-13 MED ORDER — FEBUXOSTAT 40 MG PO TABS: 80.0000 mg | ORAL_TABLET | Freq: Every day | ORAL | Status: DC

## 2011-05-13 MED ORDER — FUROSEMIDE 40 MG PO TABS: 40.0000 mg | ORAL_TABLET | Freq: Every day | ORAL | Status: DC

## 2011-05-13 MED ORDER — CLONIDINE HCL 0.2 MG PO TABS: 0.2000 mg | ORAL_TABLET | Freq: Three times a day (TID) | ORAL | Status: DC

## 2011-05-13 MED ORDER — ROSUVASTATIN CALCIUM 20 MG PO TABS: 20.0000 mg | ORAL_TABLET | Freq: Every day | ORAL | Status: DC

## 2011-05-13 MED ORDER — METOPROLOL TARTRATE 50 MG PO TABS: 50.0000 mg | ORAL_TABLET | Freq: Two times a day (BID) | ORAL | Status: DC

## 2011-05-13 MED ORDER — TERAZOSIN HCL 10 MG PO CAPS: 10.0000 mg | ORAL_CAPSULE | Freq: Every day | ORAL | Status: DC

## 2011-05-13 NOTE — Assessment & Plan Note (Addendum)
Lab Results  Component Value Date   HGBA1C 5.8* 04/01/2011   Lab Results  Component Value Date   PSA 2.06 06/23/2008   PSA 2.13 05/22/2008

## 2011-05-13 NOTE — Patient Instructions (Signed)
Continue all other medications as before You are given the refills today Please go to LAB in the Basement for the blood and/or urine tests to be done today Please call the phone number [phone] (the PhoneTree System) for results of testing in 2-3 days;  When calling, simply dial the number, and when prompted enter the MRN number above (the Medical Record Number) and the # key, then the message should start. Please return in 6 months, or sooner if needed

## 2011-05-14 ENCOUNTER — Encounter (HOSPITAL_COMMUNITY)
Admission: RE | Admit: 2011-05-14 | Discharge: 2011-05-14 | Disposition: A | Discharge status: Home / Self Care | Payer: Medicare Other | Source: Ambulatory Visit | Attending: Cardiovascular Disease | Admitting: Cardiovascular Disease

## 2011-05-16 ENCOUNTER — Encounter (HOSPITAL_COMMUNITY)
Admission: RE | Admit: 2011-05-16 | Discharge: 2011-05-16 | Disposition: A | Discharge status: Home / Self Care | Payer: Medicare Other | Source: Ambulatory Visit | Attending: Cardiovascular Disease | Admitting: Cardiovascular Disease

## 2011-05-16 DIAGNOSIS — I214 Non-ST elevation (NSTEMI) myocardial infarction: Secondary | ICD-10-CM | POA: Diagnosis not present

## 2011-05-16 DIAGNOSIS — Z87891 Personal history of nicotine dependence: Secondary | ICD-10-CM | POA: Insufficient documentation

## 2011-05-16 DIAGNOSIS — I2510 Atherosclerotic heart disease of native coronary artery without angina pectoris: Secondary | ICD-10-CM | POA: Insufficient documentation

## 2011-05-16 DIAGNOSIS — Z7982 Long term (current) use of aspirin: Secondary | ICD-10-CM | POA: Insufficient documentation

## 2011-05-16 DIAGNOSIS — N186 End stage renal disease: Secondary | ICD-10-CM | POA: Diagnosis not present

## 2011-05-16 DIAGNOSIS — Z8673 Personal history of transient ischemic attack (TIA), and cerebral infarction without residual deficits: Secondary | ICD-10-CM | POA: Diagnosis not present

## 2011-05-16 DIAGNOSIS — Z5189 Encounter for other specified aftercare: Secondary | ICD-10-CM | POA: Diagnosis not present

## 2011-05-16 DIAGNOSIS — E785 Hyperlipidemia, unspecified: Secondary | ICD-10-CM | POA: Insufficient documentation

## 2011-05-16 DIAGNOSIS — Z9861 Coronary angioplasty status: Secondary | ICD-10-CM | POA: Insufficient documentation

## 2011-05-16 DIAGNOSIS — I120 Hypertensive chronic kidney disease with stage 5 chronic kidney disease or end stage renal disease: Secondary | ICD-10-CM | POA: Insufficient documentation

## 2011-05-16 DIAGNOSIS — M19019 Primary osteoarthritis, unspecified shoulder: Secondary | ICD-10-CM | POA: Insufficient documentation

## 2011-05-16 DIAGNOSIS — Z823 Family history of stroke: Secondary | ICD-10-CM | POA: Insufficient documentation

## 2011-05-16 NOTE — Progress Notes (Signed)
[name] 51 y.o. male Nutrition Note Spoke with pt.  Nutrition Plan and Nutrition Survey reviewed with pt. Pt is following Step 2 of the Therapeutic Lifestyle Changes diet according to pt's MEDFICTS score. According to pt's 24 hour food survey, pt consumed 42 gm of saturated fat, which is 21% of estimated kcal needs. Saturated fat intake indicates pt is not following a heart healthy diet. Pt with CRF on the pre-transplant list.  Per discussion with pt, pt has not been told to limit potassium or phosphorous in his diet at this time. CRF diet reviewed with pt. Pt encouraged to discuss with his MD whether or not he needs to follow the CKD diet. Pt HbA1c indicates pt is pre-diabetic.  Pt denies any family h/o diabetes.  Pre-diabetes discussed. Pt wants to lose wt. Wt loss tips reviewed. BMET    Component Value Date/Time   NA 135 04/08/2011 0600   K 4.0 04/08/2011 0600   CL 99 04/08/2011 0600   CO2 23 04/08/2011 0600   GLUCOSE 142* 04/08/2011 0600   BUN 60* 04/08/2011 0600   CREATININE 4.05* 04/08/2011 0600   CALCIUM 9.2 04/08/2011 0600   GFRNONAA 16* 04/08/2011 0600   GFRAA 18* 04/08/2011 0600   Nutrition Diagnosis   Food-and nutrition-related knowledge deficit related to lack of exposure to information as related to diagnosis of: ? CVD ? Pre-DM (A1c 5.8)   Obesity related to excessive energy intake as evidenced by a BMI of 33.5  Nutrition RX/ Estimated Daily Nutrition Needs for: wt loss  1800-2300 Kcal, 50-65 gm fat, 13-18 gm sat fat, 1.8-2.3 gm trans-fat, <1500 mg sodium  Nutrition Intervention   Pt's individual nutrition plan including cholesterol goals reviewed with pt.   Benefits of adopting Therapeutic Lifestyle Changes discussed when Medficts reviewed.   Pt to attend the Portion Distortion class   Pt to attend the  ? Nutrition I class                          ? Nutrition II class   Pt given handouts for wt loss, pre-diabetes, CKD diet   Continue client-centered nutrition education by  RD, as part of interdisciplinary care. Goal(s)   Pt to identify and limit food sources of saturated fat, trans fat, and cholesterol   Pt to identify food quantities necessary to achieve: ? wt loss to a goal wt of 222-234 lb (100.9-106.4 kg) at graduation from cardiac rehab.  Monitor and Evaluate progress toward nutrition goal with team.

## 2011-05-18 ENCOUNTER — Encounter: Payer: Self-pay | Admitting: Internal Medicine

## 2011-05-18 NOTE — Assessment & Plan Note (Signed)
Also due for psa, for draw today

## 2011-05-18 NOTE — Assessment & Plan Note (Signed)
stable overall by hx and exam, most recent data reviewed with pt, and pt to continue medical treatment as before 

## 2011-05-18 NOTE — Assessment & Plan Note (Addendum)
stable overall by hx and exam, most recent data reviewed with pt, and pt to continue medical treatment as before  For f/u cbc today  Lab Results  Component Value Date   HGB 12.4* 05/13/2011

## 2011-05-18 NOTE — Progress Notes (Signed)
Subjective:    Patient ID: [name], male    DOB: September 02, 1960, 51 y.o.   MRN: [id number]  HPI  Here to f/u; overall doing ok,  Pt denies chest pain, increased sob or doe, wheezing, orthopnea, PND, increased LE swelling, palpitations, dizziness or syncope.  Pt denies new neurological symptoms such as new headache, or facial or extremity weakness or numbness   Pt denies polydipsia, polyuria, or low sugar symptoms such as weakness or confusion improved with po intake.  Pt states overall good compliance with meds, trying to follow lower cholesterol, diabetic diet, wt overall stable but little exercise however.  No on uloric as well. No acute complaints Past Medical History  Diagnosis Date  . ACHALASIA   . ANEMIA-NOS   . CEREBROVASCULAR ACCIDENT, HX OF   . DEGENERATIVE JOINT DISEASE, SHOULDER   . DEPRESSION   . GERD   . Headache   . HYPERLIPIDEMIA   . HYPERTENSION   . RENAL CALCULUS, HX OF   . RENAL INSUFFICIENCY   . Stroke   . Shortness of breath   . Sleep apnea   . H/O hiatal hernia   . Seizures   . CAD (coronary artery disease), 3 vessel significant disease.  03/28/2011  . Impaired glucose tolerance 05/13/2011   Past Surgical History  Procedure Date  . Tonsillectomy   . Stomach surgery 2009    achalasia  . Knee surgery 01/07/2011    right  . Coronary artery bypass graft 04/01/2011    Procedure: CORONARY ARTERY BYPASS GRAFTING (CABG);  Surgeon: Kathlee Nations Suann Larry, MD;  Location: [facility] OR;  Service: Open Heart Surgery;  Laterality: N/A;  . Insertion of dialysis catheter 04/01/2011    Procedure: INSERTION OF DIALYSIS CATHETER;  Surgeon: Nilda Simmer, MD;  Location: [facility] OR;  Service: Vascular;  Laterality: N/A;    reports that he has never smoked. He quit smokeless tobacco use about 27 years ago. His smokeless tobacco use included Chew. He reports that he drinks alcohol. He reports that he does not use illicit drugs. family history includes Hyperlipidemia in his other;  Hypertension in his other; and Stroke in his other. Allergies  Allergen Reactions  . Shrimp (Shellfish Allergy) Shortness Of Breath  . Atorvastatin Other (See Comments)    REACTION: weakness  . Insulins Other (See Comments)    Patient unsure as to the kind of insulin but states that it has previously caused seizures. This occurred in the hospital in 2006; but in most recent hospitalization (Jan 2013) received insulin but did not have reaction  . Simvastatin Other (See Comments)    REACTION: abnormal liver tests   Current Outpatient Prescriptions on File Prior to Visit  Medication Sig Dispense Refill  . acetaminophen (TYLENOL) 325 MG tablet Take 650 mg by mouth every 6 (six) hours as needed. For pain      . aspirin 325 MG tablet Take 325 mg by mouth daily.      . cloNIDine (CATAPRES) 0.2 MG tablet Take 1 tablet (0.2 mg total) by mouth 3 (three) times daily.  180 tablet  3  . Omega-3 Fatty Acids (FISH OIL) 1200 MG CAPS Take 1 capsule by mouth 3 (three) times daily.      . polysaccharide iron (NIFEREX) 150 MG CAPS capsule Take 1 capsule (150 mg total) by mouth daily.  30 each  1  . rosuvastatin (CRESTOR) 20 MG tablet Take 1 tablet (20 mg total) by mouth at bedtime.  90 tablet  3  .  terazosin (HYTRIN) 10 MG capsule Take 1 capsule (10 mg total) by mouth at bedtime.  90 capsule  3  . aspirin EC 325 MG EC tablet Take 1 tablet (325 mg total) by mouth daily.  30 tablet     Review of Systems Review of Systems  Constitutional: Negative for diaphoresis and unexpected weight change.  HENT: Negative for drooling and tinnitus.   Eyes: Negative for photophobia and visual disturbance.  Respiratory: Negative for choking and stridor.   Gastrointestinal: Negative for vomiting and blood in stool.  Genitourinary: Negative for hematuria and decreased urine volume.      Objective:   Physical Exam BP 132/94  Pulse 55  Temp(Src) 98.4 F (36.9 C) (Oral)  Ht 6' (1.829 m)  Wt 244 lb 12 oz (111.018 kg)  BMI  33.19 kg/m2  SpO2 97% Physical Exam  VS noted Constitutional: Pt appears well-developed and well-nourished.  HENT: Head: Normocephalic.  Right Ear: External ear normal.  Left Ear: External ear normal.  Eyes: Conjunctivae and EOM are normal. Pupils are equal, round, and reactive to light.  Neck: Normal range of motion. Neck supple.  Cardiovascular: Normal rate and regular rhythm.   Pulmonary/Chest: Effort normal and breath sounds normal.  Neurological: Pt is alert. No cranial nerve deficit.  Skin: Skin is warm. No erythema.  Psychiatric: Pt behavior is normal. Thought content normal.     Assessment & Plan:

## 2011-05-18 NOTE — Assessment & Plan Note (Signed)
stable overall by hx and exam, most recent data reviewed with pt, and pt to continue medical treatment as before  BP Readings from Last 3 Encounters:  05/13/11 132/94  05/01/11 132/98  04/30/11 140/80

## 2011-05-19 ENCOUNTER — Encounter (HOSPITAL_COMMUNITY)
Admission: RE | Admit: 2011-05-19 | Discharge: 2011-05-19 | Disposition: A | Discharge status: Home / Self Care | Payer: Medicare Other | Source: Ambulatory Visit | Attending: Cardiovascular Disease | Admitting: Cardiovascular Disease

## 2011-05-21 ENCOUNTER — Encounter (HOSPITAL_COMMUNITY)
Admission: RE | Admit: 2011-05-21 | Discharge: 2011-05-21 | Disposition: A | Discharge status: Home / Self Care | Payer: Medicare Other | Source: Ambulatory Visit | Attending: Cardiovascular Disease | Admitting: Cardiovascular Disease

## 2011-05-21 ENCOUNTER — Telehealth: Payer: Self-pay

## 2011-05-21 NOTE — Telephone Encounter (Signed)
Patient called requesting samples of Crestor 20 mg. Left 4 boxes (#7 each box) to pickup at front desk.

## 2011-05-23 ENCOUNTER — Encounter (HOSPITAL_COMMUNITY)
Admission: RE | Admit: 2011-05-23 | Discharge: 2011-05-23 | Disposition: A | Discharge status: Home / Self Care | Payer: Medicare Other | Source: Ambulatory Visit | Attending: Cardiovascular Disease | Admitting: Cardiovascular Disease

## 2011-05-26 ENCOUNTER — Encounter (HOSPITAL_COMMUNITY): Payer: Medicare Other

## 2011-05-26 ENCOUNTER — Other Ambulatory Visit: Payer: Self-pay

## 2011-05-26 ENCOUNTER — Encounter (HOSPITAL_COMMUNITY)
Admission: RE | Admit: 2011-05-26 | Discharge: 2011-05-26 | Disposition: A | Discharge status: Home / Self Care | Payer: Medicare Other | Source: Ambulatory Visit | Attending: Cardiovascular Disease | Admitting: Cardiovascular Disease

## 2011-05-26 DIAGNOSIS — I10 Essential (primary) hypertension: Secondary | ICD-10-CM | POA: Diagnosis not present

## 2011-05-26 MED ORDER — ROSUVASTATIN CALCIUM 20 MG PO TABS: 20.0000 mg | ORAL_TABLET | Freq: Every day | ORAL | Status: DC

## 2011-05-28 ENCOUNTER — Encounter (HOSPITAL_COMMUNITY)
Admission: RE | Admit: 2011-05-28 | Discharge: 2011-05-28 | Disposition: A | Discharge status: Home / Self Care | Payer: Medicare Other | Source: Ambulatory Visit | Attending: Cardiovascular Disease | Admitting: Cardiovascular Disease

## 2011-05-28 ENCOUNTER — Other Ambulatory Visit: Payer: Self-pay

## 2011-05-28 MED ORDER — POLYSACCHARIDE IRON 150 MG PO CAPS: 150.0000 mg | ORAL_CAPSULE | Freq: Every day | ORAL | Status: DC

## 2011-05-30 ENCOUNTER — Encounter (HOSPITAL_COMMUNITY)
Admission: RE | Admit: 2011-05-30 | Discharge: 2011-05-30 | Disposition: A | Discharge status: Home / Self Care | Payer: Medicare Other | Source: Ambulatory Visit | Attending: Cardiovascular Disease | Admitting: Cardiovascular Disease

## 2011-05-30 NOTE — Progress Notes (Signed)
Pt with elevated bp during exercise.  Pt appeared distracted and unusually quiet.  Questioned patient regarding his behavior, pt stated that his mother arrived at 6am to bring him to rehab.  Once he arrived he ran into his friend whose wife is a patient in the hospital in critical condition.  Pt given emotional support.  Workloads decreased accordingly. Pt with new medication change hydralazine 10mg  three times daily.  Medication list reconciled.

## 2011-06-02 ENCOUNTER — Encounter (HOSPITAL_COMMUNITY)
Admission: RE | Admit: 2011-06-02 | Discharge: 2011-06-02 | Disposition: A | Discharge status: Home / Self Care | Payer: Medicare Other | Source: Ambulatory Visit | Attending: Cardiovascular Disease | Admitting: Cardiovascular Disease

## 2011-06-02 ENCOUNTER — Encounter (HOSPITAL_COMMUNITY): Admission: RE | Admit: 2011-06-02 | Payer: Medicare Other | Source: Ambulatory Visit

## 2011-06-04 ENCOUNTER — Encounter (HOSPITAL_COMMUNITY)
Admission: RE | Admit: 2011-06-04 | Discharge: 2011-06-04 | Disposition: A | Discharge status: Home / Self Care | Payer: Medicare Other | Source: Ambulatory Visit | Attending: Cardiovascular Disease | Admitting: Cardiovascular Disease

## 2011-06-06 ENCOUNTER — Encounter (HOSPITAL_COMMUNITY)
Admission: RE | Admit: 2011-06-06 | Discharge: 2011-06-06 | Disposition: A | Discharge status: Home / Self Care | Payer: Medicare Other | Source: Ambulatory Visit | Attending: Cardiovascular Disease | Admitting: Cardiovascular Disease

## 2011-06-06 NOTE — Progress Notes (Signed)
Reviewed home exercise with pt today.  Pt plans to walk and use elliptical machine for exercise.  Reviewed THR, pulse, RPE, sign and symptoms, NTG use, and when to call 911 or MD.  Pt voiced understanding. Electronically signed by Harriett Sine MS on Friday June 07 2011 at 0907

## 2011-06-09 ENCOUNTER — Encounter (HOSPITAL_COMMUNITY)
Admission: RE | Admit: 2011-06-09 | Discharge: 2011-06-09 | Disposition: A | Discharge status: Home / Self Care | Payer: Medicare Other | Source: Ambulatory Visit | Attending: Cardiovascular Disease | Admitting: Cardiovascular Disease

## 2011-06-11 ENCOUNTER — Encounter (HOSPITAL_COMMUNITY)
Admission: RE | Admit: 2011-06-11 | Discharge: 2011-06-11 | Disposition: A | Discharge status: Home / Self Care | Payer: Medicare Other | Source: Ambulatory Visit | Attending: Cardiovascular Disease | Admitting: Cardiovascular Disease

## 2011-06-13 ENCOUNTER — Encounter (HOSPITAL_COMMUNITY): Payer: Medicare Other

## 2011-06-16 ENCOUNTER — Encounter (HOSPITAL_COMMUNITY)
Admission: RE | Admit: 2011-06-16 | Discharge: 2011-06-16 | Disposition: A | Discharge status: Home / Self Care | Payer: Medicare Other | Source: Ambulatory Visit | Attending: Cardiovascular Disease | Admitting: Cardiovascular Disease

## 2011-06-16 DIAGNOSIS — Z823 Family history of stroke: Secondary | ICD-10-CM | POA: Diagnosis not present

## 2011-06-16 DIAGNOSIS — N186 End stage renal disease: Secondary | ICD-10-CM | POA: Insufficient documentation

## 2011-06-16 DIAGNOSIS — Z9861 Coronary angioplasty status: Secondary | ICD-10-CM | POA: Diagnosis not present

## 2011-06-16 DIAGNOSIS — M19019 Primary osteoarthritis, unspecified shoulder: Secondary | ICD-10-CM | POA: Insufficient documentation

## 2011-06-16 DIAGNOSIS — I214 Non-ST elevation (NSTEMI) myocardial infarction: Secondary | ICD-10-CM | POA: Insufficient documentation

## 2011-06-16 DIAGNOSIS — Z8673 Personal history of transient ischemic attack (TIA), and cerebral infarction without residual deficits: Secondary | ICD-10-CM | POA: Insufficient documentation

## 2011-06-16 DIAGNOSIS — Z87891 Personal history of nicotine dependence: Secondary | ICD-10-CM | POA: Diagnosis not present

## 2011-06-16 DIAGNOSIS — Z5189 Encounter for other specified aftercare: Secondary | ICD-10-CM | POA: Diagnosis not present

## 2011-06-16 DIAGNOSIS — E785 Hyperlipidemia, unspecified: Secondary | ICD-10-CM | POA: Insufficient documentation

## 2011-06-16 DIAGNOSIS — Z7982 Long term (current) use of aspirin: Secondary | ICD-10-CM | POA: Insufficient documentation

## 2011-06-16 DIAGNOSIS — I120 Hypertensive chronic kidney disease with stage 5 chronic kidney disease or end stage renal disease: Secondary | ICD-10-CM | POA: Insufficient documentation

## 2011-06-16 DIAGNOSIS — I2510 Atherosclerotic heart disease of native coronary artery without angina pectoris: Secondary | ICD-10-CM | POA: Insufficient documentation

## 2011-06-18 ENCOUNTER — Encounter (HOSPITAL_COMMUNITY)
Admission: RE | Admit: 2011-06-18 | Discharge: 2011-06-18 | Disposition: A | Discharge status: Home / Self Care | Payer: Medicare Other | Source: Ambulatory Visit | Attending: Cardiovascular Disease | Admitting: Cardiovascular Disease

## 2011-06-20 ENCOUNTER — Encounter (HOSPITAL_COMMUNITY)
Admission: RE | Admit: 2011-06-20 | Discharge: 2011-06-20 | Disposition: A | Discharge status: Home / Self Care | Payer: Medicare Other | Source: Ambulatory Visit | Attending: Cardiovascular Disease | Admitting: Cardiovascular Disease

## 2011-06-23 ENCOUNTER — Encounter (HOSPITAL_COMMUNITY)
Admission: RE | Admit: 2011-06-23 | Discharge: 2011-06-23 | Disposition: A | Discharge status: Home / Self Care | Payer: Medicare Other | Source: Ambulatory Visit | Attending: Cardiovascular Disease | Admitting: Cardiovascular Disease

## 2011-06-23 DIAGNOSIS — N2581 Secondary hyperparathyroidism of renal origin: Secondary | ICD-10-CM | POA: Diagnosis not present

## 2011-06-23 DIAGNOSIS — I10 Essential (primary) hypertension: Secondary | ICD-10-CM | POA: Diagnosis not present

## 2011-06-23 DIAGNOSIS — D649 Anemia, unspecified: Secondary | ICD-10-CM | POA: Diagnosis not present

## 2011-06-25 ENCOUNTER — Encounter (HOSPITAL_COMMUNITY): Payer: Medicare Other

## 2011-06-25 DIAGNOSIS — I10 Essential (primary) hypertension: Secondary | ICD-10-CM | POA: Diagnosis not present

## 2011-06-25 DIAGNOSIS — I739 Peripheral vascular disease, unspecified: Secondary | ICD-10-CM | POA: Diagnosis not present

## 2011-06-25 DIAGNOSIS — I2510 Atherosclerotic heart disease of native coronary artery without angina pectoris: Secondary | ICD-10-CM | POA: Diagnosis not present

## 2011-06-25 DIAGNOSIS — R9431 Abnormal electrocardiogram [ECG] [EKG]: Secondary | ICD-10-CM | POA: Diagnosis not present

## 2011-06-25 HISTORY — PX: NM MYOCAR PERF EJECTION FRACTION: HXRAD630

## 2011-06-27 ENCOUNTER — Encounter (HOSPITAL_COMMUNITY)
Admission: RE | Admit: 2011-06-27 | Discharge: 2011-06-27 | Disposition: A | Discharge status: Home / Self Care | Payer: Medicare Other | Source: Ambulatory Visit | Attending: Cardiovascular Disease | Admitting: Cardiovascular Disease

## 2011-06-27 NOTE — Progress Notes (Signed)
Pt arrived at cardiac rehab reporting episode of palpitations yesterday associated with his exercise.  Pt reports while he walking in his neighborhood, he felt his heart pounding, stopped exercise, actually laid down to rest.  Sx resolved, pt resumed walk home.  Pt BP-190/109 upon arrival home. Pt denies further sx.  Pt denies change in medication regimen, however states he did eat lunch at a restaurant with a friend yesterday.  Pt reports he had stress test on Wednesday and feels his sx are r/t the stress agent used. Pt states he feels well today.  Pt did exercise at cardiac rehab without sx and BP-150/80s with exercise. Dr. Hazle Coca office made aware of pt sx.  Pt advised Dr. Hazle Coca office will contact him at home.  Understanding verbalized-Joann Rion,RN

## 2011-06-30 ENCOUNTER — Other Ambulatory Visit: Payer: Self-pay

## 2011-06-30 ENCOUNTER — Encounter (HOSPITAL_COMMUNITY)
Admission: EM | Disposition: A | Discharge status: Home / Self Care | Payer: Self-pay | Source: Home / Self Care | Attending: Cardiovascular Disease

## 2011-06-30 ENCOUNTER — Encounter (HOSPITAL_COMMUNITY): Payer: Self-pay | Admitting: Neurology

## 2011-06-30 ENCOUNTER — Inpatient Hospital Stay: Admission: AD | Admit: 2011-06-30 | Payer: Medicare Other | Source: Ambulatory Visit | Admitting: Cardiovascular Disease

## 2011-06-30 ENCOUNTER — Inpatient Hospital Stay (HOSPITAL_COMMUNITY)
Admission: EM | Admit: 2011-06-30 | Discharge: 2011-07-07 | DRG: 247 | Disposition: A | Discharge status: Home / Self Care | Payer: Medicare Other | Attending: Cardiovascular Disease | Admitting: Cardiovascular Disease

## 2011-06-30 ENCOUNTER — Encounter (HOSPITAL_COMMUNITY)
Admission: RE | Admit: 2011-06-30 | Discharge: 2011-06-30 | Disposition: A | Discharge status: Home / Self Care | Payer: Medicare Other | Source: Ambulatory Visit | Attending: Cardiovascular Disease | Admitting: Cardiovascular Disease

## 2011-06-30 DIAGNOSIS — N259 Disorder resulting from impaired renal tubular function, unspecified: Secondary | ICD-10-CM | POA: Diagnosis present

## 2011-06-30 DIAGNOSIS — M19019 Primary osteoarthritis, unspecified shoulder: Secondary | ICD-10-CM | POA: Diagnosis present

## 2011-06-30 DIAGNOSIS — I639 Cerebral infarction, unspecified: Secondary | ICD-10-CM

## 2011-06-30 DIAGNOSIS — R079 Chest pain, unspecified: Secondary | ICD-10-CM

## 2011-06-30 DIAGNOSIS — Z8673 Personal history of transient ischemic attack (TIA), and cerebral infarction without residual deficits: Secondary | ICD-10-CM

## 2011-06-30 DIAGNOSIS — I25810 Atherosclerosis of coronary artery bypass graft(s) without angina pectoris: Secondary | ICD-10-CM | POA: Diagnosis not present

## 2011-06-30 DIAGNOSIS — I214 Non-ST elevation (NSTEMI) myocardial infarction: Secondary | ICD-10-CM | POA: Diagnosis not present

## 2011-06-30 DIAGNOSIS — K219 Gastro-esophageal reflux disease without esophagitis: Secondary | ICD-10-CM | POA: Diagnosis present

## 2011-06-30 DIAGNOSIS — E785 Hyperlipidemia, unspecified: Secondary | ICD-10-CM | POA: Diagnosis present

## 2011-06-30 DIAGNOSIS — M109 Gout, unspecified: Secondary | ICD-10-CM | POA: Diagnosis present

## 2011-06-30 DIAGNOSIS — F3289 Other specified depressive episodes: Secondary | ICD-10-CM | POA: Diagnosis present

## 2011-06-30 DIAGNOSIS — I208 Other forms of angina pectoris: Secondary | ICD-10-CM | POA: Insufficient documentation

## 2011-06-30 DIAGNOSIS — I200 Unstable angina: Secondary | ICD-10-CM | POA: Diagnosis not present

## 2011-06-30 DIAGNOSIS — Z8679 Personal history of other diseases of the circulatory system: Secondary | ICD-10-CM

## 2011-06-30 DIAGNOSIS — Z959 Presence of cardiac and vascular implant and graft, unspecified: Secondary | ICD-10-CM | POA: Diagnosis not present

## 2011-06-30 DIAGNOSIS — I10 Essential (primary) hypertension: Secondary | ICD-10-CM | POA: Diagnosis present

## 2011-06-30 DIAGNOSIS — I129 Hypertensive chronic kidney disease with stage 1 through stage 4 chronic kidney disease, or unspecified chronic kidney disease: Secondary | ICD-10-CM | POA: Diagnosis present

## 2011-06-30 DIAGNOSIS — E669 Obesity, unspecified: Secondary | ICD-10-CM | POA: Diagnosis not present

## 2011-06-30 DIAGNOSIS — N184 Chronic kidney disease, stage 4 (severe): Secondary | ICD-10-CM | POA: Diagnosis present

## 2011-06-30 DIAGNOSIS — D649 Anemia, unspecified: Secondary | ICD-10-CM | POA: Diagnosis present

## 2011-06-30 DIAGNOSIS — I249 Acute ischemic heart disease, unspecified: Secondary | ICD-10-CM

## 2011-06-30 DIAGNOSIS — Z951 Presence of aortocoronary bypass graft: Secondary | ICD-10-CM

## 2011-06-30 DIAGNOSIS — I2510 Atherosclerotic heart disease of native coronary artery without angina pectoris: Secondary | ICD-10-CM | POA: Diagnosis not present

## 2011-06-30 DIAGNOSIS — F329 Major depressive disorder, single episode, unspecified: Secondary | ICD-10-CM | POA: Diagnosis present

## 2011-06-30 DIAGNOSIS — Z955 Presence of coronary angioplasty implant and graft: Secondary | ICD-10-CM

## 2011-06-30 DIAGNOSIS — Z9861 Coronary angioplasty status: Secondary | ICD-10-CM

## 2011-06-30 DIAGNOSIS — I739 Peripheral vascular disease, unspecified: Secondary | ICD-10-CM | POA: Diagnosis present

## 2011-06-30 HISTORY — PX: LEFT HEART CATHETERIZATION WITH CORONARY/GRAFT ANGIOGRAM: SHX5450

## 2011-06-30 HISTORY — DX: Personal history of other diseases of the circulatory system: Z86.79

## 2011-06-30 HISTORY — DX: Chronic kidney disease, stage 4 (severe): N18.4

## 2011-06-30 HISTORY — DX: Coronary angioplasty status: Z98.61

## 2011-06-30 HISTORY — DX: Non-ST elevation (NSTEMI) myocardial infarction: I21.4

## 2011-06-30 HISTORY — DX: Presence of coronary angioplasty implant and graft: Z95.5

## 2011-06-30 HISTORY — PX: PERCUTANEOUS CORONARY INTERVENTION-BALLOON ONLY: SHX6014

## 2011-06-30 LAB — CBC
HCT: 37.1 % — ABNORMAL LOW (ref 39.0–52.0)
MCHC: 32.6 g/dL (ref 30.0–36.0)
Platelets: 291 10*3/uL (ref 150–400)
RDW: 14.2 % (ref 11.5–15.5)

## 2011-06-30 LAB — CARDIAC PANEL(CRET KIN+CKTOT+MB+TROPI): Total CK: 266 U/L — ABNORMAL HIGH (ref 7–232)

## 2011-06-30 LAB — BASIC METABOLIC PANEL
BUN: 40 mg/dL — ABNORMAL HIGH (ref 6–23)
GFR calc Af Amer: 22 mL/min — ABNORMAL LOW (ref >90)
GFR calc non Af Amer: 19 mL/min — ABNORMAL LOW (ref >90)
Potassium: 4.4 mEq/L (ref 3.5–5.1)
Sodium: 139 mEq/L (ref 135–145)

## 2011-06-30 LAB — POCT ACTIVATED CLOTTING TIME: Activated Clotting Time: 358 seconds

## 2011-06-30 SURGERY — LEFT HEART CATHETERIZATION WITH CORONARY/GRAFT ANGIOGRAM
Anesthesia: LOCAL

## 2011-06-30 MED ORDER — BIVALIRUDIN 250 MG IV SOLR
INTRAVENOUS | Status: AC
  Filled 2011-06-30: qty 250

## 2011-06-30 MED ORDER — NON FORMULARY: 20.0000 mg | Freq: Every day | Status: DC

## 2011-06-30 MED ORDER — MIDAZOLAM HCL 2 MG/2ML IJ SOLN
INTRAMUSCULAR | Status: AC
  Filled 2011-06-30: qty 2

## 2011-06-30 MED ORDER — NITROGLYCERIN IN D5W 200-5 MCG/ML-% IV SOLN
5.0000 ug/min | INTRAVENOUS | Status: DC
  Administered 2011-06-30: 5 ug/min via INTRAVENOUS
  Filled 2011-06-30: qty 250

## 2011-06-30 MED ORDER — SODIUM CHLORIDE 0.9 % IJ SOLN: 3.0000 mL | INTRAMUSCULAR | Status: DC | PRN

## 2011-06-30 MED ORDER — SODIUM CHLORIDE 0.9 % IV SOLN: INTRAVENOUS | Status: DC

## 2011-06-30 MED ORDER — FUROSEMIDE 40 MG PO TABS
40.0000 mg | ORAL_TABLET | Freq: Every day | ORAL | Status: DC
  Filled 2011-06-30: qty 1

## 2011-06-30 MED ORDER — TERAZOSIN HCL 5 MG PO CAPS
10.0000 mg | ORAL_CAPSULE | Freq: Every day | ORAL | Status: DC
  Administered 2011-06-30 – 2011-07-06 (×7): 10 mg via ORAL
  Filled 2011-06-30 (×9): qty 2

## 2011-06-30 MED ORDER — METOPROLOL TARTRATE 50 MG PO TABS
50.0000 mg | ORAL_TABLET | Freq: Two times a day (BID) | ORAL | Status: DC
  Administered 2011-06-30: 50 mg via ORAL
  Filled 2011-06-30 (×3): qty 1

## 2011-06-30 MED ORDER — SODIUM CHLORIDE 0.9 % IV SOLN: INTRAVENOUS | Status: AC

## 2011-06-30 MED ORDER — NITROGLYCERIN IN D5W 200-5 MCG/ML-% IV SOLN
5.0000 ug/min | INTRAVENOUS | Status: DC
  Administered 2011-06-30: 70 ug/min via INTRAVENOUS
  Administered 2011-07-01: 50 ug/min via INTRAVENOUS
  Administered 2011-07-01: 100 ug/min via INTRAVENOUS
  Administered 2011-07-01: 50 ug/min via INTRAVENOUS
  Filled 2011-06-30 (×2): qty 250

## 2011-06-30 MED ORDER — SODIUM CHLORIDE 0.9 % IJ SOLN
3.0000 mL | Freq: Two times a day (BID) | INTRAMUSCULAR | Status: DC
  Administered 2011-06-30 – 2011-07-01 (×2): 3 mL via INTRAVENOUS

## 2011-06-30 MED ORDER — ASPIRIN EC 81 MG PO TBEC
81.0000 mg | DELAYED_RELEASE_TABLET | Freq: Every day | ORAL | Status: DC
  Administered 2011-07-01 – 2011-07-03 (×3): 81 mg via ORAL
  Filled 2011-06-30 (×4): qty 1

## 2011-06-30 MED ORDER — HYDRALAZINE HCL 20 MG/ML IJ SOLN
10.0000 mg | Freq: Once | INTRAMUSCULAR | Status: AC
  Administered 2011-06-30: 10 mg via INTRAVENOUS
  Filled 2011-06-30: qty 0.5

## 2011-06-30 MED ORDER — ATORVASTATIN CALCIUM 10 MG PO TABS
10.0000 mg | ORAL_TABLET | Freq: Every day | ORAL | Status: DC
  Filled 2011-06-30 (×2): qty 1

## 2011-06-30 MED ORDER — TICAGRELOR 90 MG PO TABS
ORAL_TABLET | ORAL | Status: AC
  Filled 2011-06-30: qty 2

## 2011-06-30 MED ORDER — SODIUM CHLORIDE 0.9 % IV SOLN: 0.2500 mg/kg/h | INTRAVENOUS | Status: AC

## 2011-06-30 MED ORDER — OMEGA-3-ACID ETHYL ESTERS 1 G PO CAPS
2.0000 g | ORAL_CAPSULE | Freq: Every day | ORAL | Status: DC
  Administered 2011-06-30 – 2011-07-07 (×8): 2 g via ORAL
  Filled 2011-06-30 (×9): qty 2

## 2011-06-30 MED ORDER — HEPARIN (PORCINE) IN NACL 100-0.45 UNIT/ML-% IJ SOLN
1400.0000 [IU]/h | INTRAMUSCULAR | Status: DC
  Filled 2011-06-30 (×2): qty 250

## 2011-06-30 MED ORDER — ACETAMINOPHEN 325 MG PO TABS
650.0000 mg | ORAL_TABLET | ORAL | Status: DC | PRN
  Administered 2011-07-01: 650 mg via ORAL
  Filled 2011-06-30 (×2): qty 2

## 2011-06-30 MED ORDER — HEPARIN (PORCINE) IN NACL 2-0.9 UNIT/ML-% IJ SOLN
INTRAMUSCULAR | Status: AC
  Filled 2011-06-30: qty 2000

## 2011-06-30 MED ORDER — CLONIDINE HCL 0.2 MG PO TABS
0.2000 mg | ORAL_TABLET | Freq: Three times a day (TID) | ORAL | Status: DC
  Administered 2011-06-30 – 2011-07-07 (×20): 0.2 mg via ORAL
  Filled 2011-06-30 (×24): qty 1

## 2011-06-30 MED ORDER — ASPIRIN 300 MG RE SUPP: 300.0000 mg | RECTAL | Status: DC

## 2011-06-30 MED ORDER — LIDOCAINE HCL (PF) 1 % IJ SOLN
INTRAMUSCULAR | Status: AC
  Filled 2011-06-30: qty 30

## 2011-06-30 MED ORDER — SODIUM CHLORIDE 0.9 % IV SOLN: 250.0000 mL | INTRAVENOUS | Status: DC | PRN

## 2011-06-30 MED ORDER — HEPARIN (PORCINE) IN NACL 100-0.45 UNIT/ML-% IJ SOLN
1400.0000 [IU]/h | INTRAMUSCULAR | Status: DC
  Filled 2011-06-30: qty 250

## 2011-06-30 MED ORDER — SODIUM CHLORIDE 0.9 % IV SOLN
INTRAVENOUS | Status: DC
  Administered 2011-06-30 – 2011-07-01 (×2): via INTRAVENOUS

## 2011-06-30 MED ORDER — FENTANYL CITRATE 0.05 MG/ML IJ SOLN
INTRAMUSCULAR | Status: AC
  Filled 2011-06-30: qty 2

## 2011-06-30 MED ORDER — ACETAMINOPHEN 325 MG PO TABS: 650.0000 mg | ORAL_TABLET | ORAL | Status: DC | PRN

## 2011-06-30 MED ORDER — ACETAMINOPHEN 325 MG PO TABS
650.0000 mg | ORAL_TABLET | Freq: Four times a day (QID) | ORAL | Status: DC | PRN
  Administered 2011-06-30: 650 mg via ORAL

## 2011-06-30 MED ORDER — ASPIRIN 81 MG PO CHEW
324.0000 mg | CHEWABLE_TABLET | ORAL | Status: DC
  Filled 2011-06-30: qty 4

## 2011-06-30 MED ORDER — ONDANSETRON HCL 4 MG/2ML IJ SOLN: 4.0000 mg | Freq: Four times a day (QID) | INTRAMUSCULAR | Status: DC | PRN

## 2011-06-30 MED ORDER — NITROGLYCERIN 0.2 MG/ML ON CALL CATH LAB
INTRAVENOUS | Status: AC
  Filled 2011-06-30: qty 1

## 2011-06-30 MED ORDER — TICAGRELOR 90 MG PO TABS
90.0000 mg | ORAL_TABLET | Freq: Two times a day (BID) | ORAL | Status: DC
  Administered 2011-07-01 – 2011-07-03 (×6): 90 mg via ORAL
  Filled 2011-06-30 (×8): qty 1

## 2011-06-30 MED ORDER — MORPHINE SULFATE 2 MG/ML IJ SOLN
2.0000 mg | INTRAMUSCULAR | Status: DC | PRN
  Administered 2011-06-30 – 2011-07-04 (×2): 2 mg via INTRAVENOUS
  Filled 2011-06-30 (×2): qty 1

## 2011-06-30 MED ORDER — POLYSACCHARIDE IRON 150 MG PO CAPS
150.0000 mg | ORAL_CAPSULE | Freq: Every day | ORAL | Status: DC
  Administered 2011-07-01 – 2011-07-07 (×7): 150 mg via ORAL
  Filled 2011-06-30 (×8): qty 1

## 2011-06-30 MED ORDER — HYDRALAZINE HCL 10 MG PO TABS
10.0000 mg | ORAL_TABLET | Freq: Three times a day (TID) | ORAL | Status: DC
  Administered 2011-06-30 – 2011-07-01 (×4): 10 mg via ORAL
  Filled 2011-06-30 (×7): qty 1

## 2011-06-30 MED ORDER — HEPARIN BOLUS VIA INFUSION: 2000.0000 [IU] | Freq: Once | INTRAVENOUS | Status: DC

## 2011-06-30 MED ORDER — FEBUXOSTAT 40 MG PO TABS
40.0000 mg | ORAL_TABLET | Freq: Two times a day (BID) | ORAL | Status: DC
  Administered 2011-07-01 – 2011-07-07 (×13): 40 mg via ORAL
  Filled 2011-06-30 (×16): qty 1

## 2011-06-30 NOTE — Consult Note (Signed)
ANTICOAGULATION CONSULT NOTE - Initial Consult  Pharmacy Consult for Heparin Indication: chest pain/ACS  Allergies  Allergen Reactions  . Shrimp (Shellfish Allergy) Shortness Of Breath  . Atorvastatin Other (See Comments)    REACTION: weakness  . Insulins Other (See Comments)    Patient unsure as to the kind of insulin but states that it has previously caused seizures. This occurred in the hospital in 2006; but in most recent hospitalization (Jan 2013) received insulin but did not have reaction  . Simvastatin Other (See Comments)    REACTION: abnormal liver tests    Patient Measurements: Height: 6' (182.9 cm) Weight: 244 lb 11.4 oz (111 kg) IBW/kg (Calculated) : 77.6  Heparin Dosing Weight: 101.2kg  Vital Signs: Temp: 98.4 F (36.9 C) (04/15 1233) Temp src: Oral (04/15 1233) BP: 161/89 mmHg (04/15 1248) Pulse Rate: 59  (04/15 1233)  Labs:  Basename 06/30/11 1258 06/30/11 1255  HGB -- 12.1*  HCT -- 37.1*  PLT -- 291  APTT -- --  LABPROT 14.4 --  INR 1.10 --  HEPARINUNFRC -- --  CREATININE -- --  CKTOTAL -- --  CKMB -- --  TROPONINI -- --   Estimated Creatinine Clearance: 27.8 ml/min (by C-G formula based on Cr of 4.05).  Medical History: Past Medical History  Diagnosis Date  . ACHALASIA   . ANEMIA-NOS   . CEREBROVASCULAR ACCIDENT, HX OF   . DEGENERATIVE JOINT DISEASE, SHOULDER   . DEPRESSION   . GERD   . Headache   . HYPERLIPIDEMIA   . HYPERTENSION   . RENAL CALCULUS, HX OF   . RENAL INSUFFICIENCY   . Stroke   . Shortness of breath   . Sleep apnea   . H/O hiatal hernia   . Seizures   . CAD (coronary artery disease), 3 vessel significant disease.  03/28/2011  . Impaired glucose tolerance 05/13/2011   Medications:  No anticoagulants prior to admission  Assessment: 51yom to begin heparin for chest pain. He had a recent CABG x4 done in January of this year. He also has a history of renal insufficiency and sCr is elevated at 3.43, CrCl 19ml/min. Plan is  for cath today if renal function can be optimized.  Goal of Therapy:  Heparin level 0.3-0.7 units/ml   Plan:  1) Heparin bolus 2000 units x 1 (small d/t elevated sCr) 2) Heparin drip at 1400 units/hr 3) 6h heparin level vs follow up after cath 4) Daily heparin level and CBC  Fredrik Rigger 06/30/2011,1:41 PM

## 2011-06-30 NOTE — ED Notes (Addendum)
Pt has hx CAD, bypass in Jan. While at cardiac rehab had CP 6/10. Reporting similar episodes last Thursday and Saturday. EKG changed seen on new ekg. Denying any CP or diaphoresis when CP occurred. Denying any cp at this time. Alert, oriented. Pt was given 2 L oxygen. Initially when CP occurred BP was 200 systolic. BP came down to 142/92. HR 50-60's. [name] putting orders in for pt, possible cath. Pt needs NPO. Report provided from [place], [place]

## 2011-06-30 NOTE — ED Provider Notes (Signed)
History     CSN: [id number]  Arrival date & time 06/30/11  1226   None     Chief Complaint  Patient presents with  . Chest Pain    (Consider location/radiation/quality/duration/timing/severity/associated sxs/prior treatment) HPI Complaint of chest pain onset while receiving cardiac rehabilitation this morning lasted approximately 8 minutes resolved spontaneously reasonably asymptomatic chest pain is anterior nonradiating dull in nature patient sent to the emergency department to hold is no inpatient bed available pending cardiac catheterization to be performed later today Past Medical History  Diagnosis Date  . ACHALASIA   . ANEMIA-NOS   . CEREBROVASCULAR ACCIDENT, HX OF   . DEGENERATIVE JOINT DISEASE, SHOULDER   . DEPRESSION   . GERD   . Headache   . HYPERLIPIDEMIA   . HYPERTENSION   . RENAL CALCULUS, HX OF   . RENAL INSUFFICIENCY   . Stroke   . Shortness of breath   . Sleep apnea   . H/O hiatal hernia   . Seizures   . CAD (coronary artery disease), 3 vessel significant disease.  03/28/2011  . Impaired glucose tolerance 05/13/2011    Past Surgical History  Procedure Date  . Tonsillectomy   . Stomach surgery 2009    achalasia  . Knee surgery 01/07/2011    right  . Coronary artery bypass graft 04/01/2011    Procedure: CORONARY ARTERY BYPASS GRAFTING (CABG);  Surgeon: Kathlee Nations Suann Larry, MD;  Location: [facility] OR;  Service: Open Heart Surgery;  Laterality: N/A;  . Insertion of dialysis catheter 04/01/2011    Procedure: INSERTION OF DIALYSIS CATHETER;  Surgeon: Nilda Simmer, MD;  Location: [facility] OR;  Service: Vascular;  Laterality: N/A;    Family History  Problem Relation Age of Onset  . Hypertension Other   . Stroke Other   . Hyperlipidemia Other     History  Substance Use Topics  . Smoking status: Never Smoker   . Smokeless tobacco: Former Neurosurgeon    Types: Chew    Quit date: 03/17/1984  . Alcohol Use: Yes     rarely      Review of Systems    Cardiovascular: Positive for chest pain.  All other systems reviewed and are negative.    Allergies  Shrimp; Atorvastatin; Insulins; and Simvastatin  Home Medications   Current Outpatient Rx  Name Route Sig Dispense Refill  . ACETAMINOPHEN 325 MG PO TABS Oral Take 650 mg by mouth every 6 (six) hours as needed. For pain    . ASPIRIN 325 MG PO TABS Oral Take 325 mg by mouth daily.    . ASPIRIN 325 MG PO TBEC Oral Take 1 tablet (325 mg total) by mouth daily. 30 tablet   . CLONIDINE HCL 0.2 MG PO TABS Oral Take 1 tablet (0.2 mg total) by mouth 3 (three) times daily. 180 tablet 3  . COLCHICINE 0.6 MG PO TABS Oral Take 1 tablet (0.6 mg total) by mouth daily as needed. For gout flare-ups 90 tablet 3  . FEBUXOSTAT 40 MG PO TABS Oral Take 40 mg by mouth 2 (two) times daily.    . FEBUXOSTAT 40 MG PO TABS Oral Take 2 tablets (80 mg total) by mouth daily. 180 tablet 3  . FUROSEMIDE 40 MG PO TABS Oral Take 1 tablet (40 mg total) by mouth daily. 90 tablet 3  . HYDRALAZINE HCL 10 MG PO TABS Oral Take 10 mg by mouth 3 (three) times daily.    Marland Kitchen METOPROLOL TARTRATE 50 MG PO TABS Oral  Take 1 tablet (50 mg total) by mouth 2 (two) times daily. 180 tablet 3  . FISH OIL 1200 MG PO CAPS Oral Take 1 capsule by mouth 3 (three) times daily.    Marland Kitchen POLYSACCHARIDE IRON 150 MG PO CAPS Oral Take 1 capsule (150 mg total) by mouth daily. 30 each 1  . ROSUVASTATIN CALCIUM 20 MG PO TABS Oral Take 1 tablet (20 mg total) by mouth at bedtime. 90 tablet 3  . TERAZOSIN HCL 10 MG PO CAPS Oral Take 1 capsule (10 mg total) by mouth at bedtime. 90 capsule 3    BP 161/89  Pulse 59  Temp(Src) 98.4 F (36.9 C) (Oral)  Resp 20  SpO2 99%  Physical Exam  Nursing note and vitals reviewed. Constitutional: He appears well-developed and well-nourished.  HENT:  Head: Normocephalic and atraumatic.  Eyes: Conjunctivae are normal. Pupils are equal, round, and reactive to light.  Neck: Neck supple. No tracheal deviation present. No  thyromegaly present.  Cardiovascular: Regular rhythm.   No murmur heard. Pulmonary/Chest: Effort normal and breath sounds normal.  Abdominal: Soft. Bowel sounds are normal. He exhibits no distension. There is no tenderness.       obese  Musculoskeletal: Normal range of motion. He exhibits no edema and no tenderness.  Neurological: He is alert. Coordination normal.  Skin: Skin is warm and dry. No rash noted.  Psychiatric: He has a normal mood and affect.    Date: 06/30/2011  Rate: 52  Rhythm: sinus bradycardia  QRS Axis: normal  Intervals: normal  ST/T Wave abnormalities: nonspecific ST/T changes  Conduction Disutrbances:none  Narrative Interpretation:   Old EKG Reviewed: unchanged EKG possibly consistent with acute inferior wall myocardial infarction, unchanged from 9:07 AM today     Procedures (including critical care time)  Labs Reviewed - No data to display No results found.   No diagnosis found.    MDM  Code stemi not called as patient is asymptomatic presently and EKG is unchanged from earlier today. This is a medical screening exam as patient came in with admission orders is presently asymptomatic Diagnosis acute coronary syndrome       Doug Sou, MD 06/30/11 [phone]

## 2011-06-30 NOTE — CV Procedure (Signed)
CARDIAC CATHETERIZATION REPORT   Procedures performed:  1. Left heart catheterization  2. Selective coronary angiography  3. Selective angiography of saphenous vein graft bypasses to the OM1/OM 2 and to the PLA. 4. Selective angiography of left internal mammary bypass graft to the LAD 5. Left ventricle pressure recording  Reason for procedure:  Acute non- ST segment elevation myocardial infarction   Procedure performed by: Thurmon Fair, MD, [facility]  Complications: none   Estimated blood loss: less than 5 mL   History:  This is a 51 year old man with premature coronary artery disease who underwent four-vessel bypass surgery just 3 months ago (LIMA to the LAD, SVG to PLA branch of the RCA, sequential SVG to OM1 and on 2). He also has stage IV chronic kidney disease and is on the renal transplant waiting list ( current creatinine is 3.4 mg/dL, close to his baseline). A very recent nuclear stress test showed a small area of inferolateral ischemia. At that time he did not have prominent anginal symptoms. Over the last few days has developed angina with light exertion and he developed severe chest pain while walking a treadmill at cardiac rehabilitation earlier today. His electrocardiogram shows extensive ST-T wave changes in the anterior and lateral leads. His cardiac enzymes show a slight elevation in cardiac troponin I.  Consent: The risks, benefits, and details of the procedure were explained to the patient. Risks including death, MI, stroke, bleeding, limb ischemia, renal failure and allergy were described and accepted by the patient. Informed written consent was obtained prior to proceeding.  Technique: The patient was brought to the cardiac catheterization laboratory in the fasting state. He was prepped and draped in the usual sterile fashion. Local anesthesia with 1% lidocaine was administered to the right groin area. Using the modified Seldinger technique a 5 French right common femoral  artery sheath was introduced without difficulty. Under fluoroscopic guidance, using 5 [place] JL4, JR and angled pigtail catheters, selective cannulation of the left coronary artery, right coronary artery, LIMA to LAD bypass and SVG to PLA and OM1/OM 2 and left ventricle were respectively performed. Several coronary angiograms in a variety of projections were recorded, but a left ventriculogram is not performed in order to reduce contrast exposure. Left ventricular pressure and a pull back to the aorta were recorded. No immediate complications occurred. The diagnostic procedure was mutely followed by percutaneous for breast physician performer Dr. Daphene Jaeger M.D.  Contrast used: 70 mL Omnipaque  Angiographic Findings:  1. The left main coronary artery is free of significant atherosclerosis and bifurcates in the usual fashion into the left anterior descending artery and left circumflex coronary artery.  2. The left anterior descending artery is severely and diffusely diseased throughout its entire proximal third. He generates a very large first septal artery there appears healthy. Thereafter the vessel is severely stenosed. He generates a smaller second septal artery and a diffusely diseased first diagonal artery. Beyond this the native LAD arteries totally occluded which is a new development since his previous cardiac catheterization 3. The left circumflex coronary artery is a very large-size vessel possibly co-dominant vessel that generates 3 major oblique marginal arteries. There is evidence of extensive luminal irregularities and mild calcification. Scattered 50-70% stenoses are seen through the entire AV groove portion of the left circumflex coronary artery. There is no evidence of competitive flow in the oblique marginal arteries from the saphenous vein graft bypass. 4. The right coronary artery is a small size-size co dominant vessel that generates only  the right posterior descending artery There is  evidence of expense luminal irregularities and mild calcification. no hemodynamically meaningful stenoses are seen in the RCA proper but the proximal portion of the PDA severely diseased with about 80% to 90% stenosis/ 5. The left ventricle was not injected. The left ventricular end-diastolic pressure is 9 mm Hg. There is notice of aortic stenosis by pullback. 6. The left internal mammary artery bypass to the LAD is a widely patent and healthy conduit. It connects to the mid to distal LAD and provides mostly antegrade flow as well as flow to a small retrograde portion that is then totally occluded. The LAD artery severely diseased with at least to 80% smooth stenosis he seen upstream and downstream of the anastomotic site. .7.  The saphenous vein graft bypass to the PDA is a small and diseased conduit. Significant irregularities and stenosis are seen prominently in the proximal third of the vessel. The maximum diameter stenosis may be as bad as 70%. However there is TIMI-3 flow in the vein graft is relatively well matched in caliber with the distal PDA. 8. Sequential saphenous vein graft to the OM1 and OM2 is generally a healthy conduit. The anastomosis with the first oblique marginal is healthy but feeds a diffusely diseased branching OM 1. The distal limb of the sequential saphenous vein graft is also healthy but there is an extremely severe stenosis at the anastomotic site. This is probably greater than 90% in severity. Diet "hangs up" just before the anastomosis, suggesting that this may be a culprit vessel    IMPRESSIONS:  Mr. Konigsberg has remarkably severe native coronary artery and a vein graft disease in the distribution of all 3 coronary arteries. A clear-cut culprit is not obvious but the distal limb of the saphenous vein graft to the OM1/OM 2 appears the most likely candidate. This coincides with I nerve ischemia seen on nuclear scintigraphy. It does not fit perfectly with the EKG changes which be  more in keeping with LAD artery disease.  The LAD vessel itself cannot be approached via the native left main coronary artery. The LIMA bypass is a very tortuous vessel which should make intervention on the LAD artery very difficult. RECOMMENDATION:  Attempt PCI to the distal limb of the saphenous vein graft to the OM1/OM 2

## 2011-06-30 NOTE — H&P (Signed)
[name] is an 51 y.o. male.   Chief Complaint:  Chest pain HPI: the patient is a very pleasant 51 year old obese African American male with a history of a recent coronary artery bypass grafting times four in January of this year.  He has a LIMA to the LAD, SVG to the PL branch of the RCA a sequential SVG to the OM1 and 2.  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  Patient was last seen by Dr. Allyson Sabal on 04/28/2011 at which time, he been doing quite well without any CP. He had a Lexiscan stress test  last Wednesday which was considered to be low risk for ischemia. On Thursday of last week patient developed stable angina during exercise.  He was walking and developed severe chest pressure.  He stopped and laid down on the grass and the chest pain resolved. On Saturday the patient had episode of unstable angina with chest pain while resting on the couch. Both times his blood pressures been elevated over 180 systolic. Today while at cardiac rehabilitation the patient was exercising developed  7/10 chest pain with associated shortness of breath but without radiation or diaphoresis.  EKG again at that time showed significant T wave inversion considerably greater than the prior EKG in February.  Patient denies any nausea, vomiting, dizziness, lower extremity edema, abdominal pain.  Past Medical History  Diagnosis Date  . ACHALASIA   . ANEMIA-NOS   . CEREBROVASCULAR ACCIDENT, HX OF   . DEGENERATIVE JOINT DISEASE, SHOULDER   . DEPRESSION   . GERD   . Headache   . HYPERLIPIDEMIA   . HYPERTENSION   . RENAL CALCULUS, HX OF   . RENAL INSUFFICIENCY   . Stroke   . Shortness of breath   . Sleep apnea   . H/O hiatal hernia   . Seizures   . CAD (coronary artery disease), 3 vessel significant disease.  03/28/2011  . Impaired glucose tolerance 05/13/2011    Past Surgical History  Procedure  Date  . Tonsillectomy   . Stomach surgery 2009    achalasia  . Knee surgery 01/07/2011    right  . Coronary artery bypass graft 04/01/2011    Procedure: CORONARY ARTERY BYPASS GRAFTING (CABG);  Surgeon: Kathlee Nations Suann Larry, MD;  Location[facility] OR;  Service: Open Heart Surgery;  Laterality: N/A;  . Insertion of dialysis catheter 04/01/2011    Procedure: INSERTION OF DIALYSIS CATHETER;  Surgeon: Nilda Simmer, MD;  Location: [facility] OR;  Service: Vascular;  Laterality: N/A;    Family History  Problem Relation Age of Onset  . Hypertension Other   . Stroke Other   . Hyperlipidemia Other    Social History:  reports that he has never smoked. He quit smokeless tobacco use about 27 years ago. His smokeless tobacco use included Chew. He reports that he drinks alcohol. He reports that he does not use illicit drugs.  Allergies:  Allergies  Allergen Reactions  . Shrimp (Shellfish Allergy) Shortness Of Breath  . Atorvastatin Other (See Comments)    REACTION: weakness  . Insulins Other (See Comments)    Patient unsure as to the kind of insulin but states that it has previously caused seizures. This occurred in the hospital in 2006; but in most recent hospitalization (Jan 2013) received insulin but did not have reaction  . Simvastatin Other (See Comments)    REACTION: abnormal liver tests  Medications Prior to Admission  Medication Sig Dispense Refill  . aspirin 325 MG tablet Take 325 mg by mouth daily.      . cloNIDine (CATAPRES) 0.2 MG tablet Take 1 tablet (0.2 mg total) by mouth 3 (three) times daily.  180 tablet  3  . febuxostat (ULORIC) 40 MG tablet Take 2 tablets (80 mg total) by mouth daily.  180 tablet  3  . furosemide (LASIX) 40 MG tablet Take 1 tablet (40 mg total) by mouth daily.  90 tablet  3  . hydrALAZINE (APRESOLINE) 10 MG tablet Take 10 mg by mouth 3 (three) times daily.      . metoprolol (LOPRESSOR) 50 MG tablet Take 1 tablet (50 mg total) by mouth 2 (two) times daily.  180  tablet  3  . Omega-3 Fatty Acids (FISH OIL) 1200 MG CAPS Take 1 capsule by mouth 3 (three) times daily.      . polysaccharide iron (NIFEREX) 150 MG CAPS capsule Take 1 capsule (150 mg total) by mouth daily.  30 each  1  . rosuvastatin (CRESTOR) 20 MG tablet Take 1 tablet (20 mg total) by mouth at bedtime.  90 tablet  3  . terazosin (HYTRIN) 10 MG capsule Take 1 capsule (10 mg total) by mouth at bedtime.  90 capsule  3  . acetaminophen (TYLENOL) 325 MG tablet Take 650 mg by mouth every 6 (six) hours as needed. For pain      . aspirin EC 325 MG EC tablet Take 1 tablet (325 mg total) by mouth daily.  30 tablet    . colchicine 0.6 MG tablet Take 1 tablet (0.6 mg total) by mouth daily as needed. For gout flare-ups  90 tablet  3  . febuxostat (ULORIC) 40 MG tablet Take 40 mg by mouth 2 (two) times daily.       No current facility-administered medications on file as of 06/30/2011.    No results found for this or any previous visit (from the past 48 hour(s)). No results found.  Review of Systems  Constitutional: Negative for fever, chills and diaphoresis.  HENT: Negative for congestion and neck pain.   Respiratory: Positive for shortness of breath. Negative for cough.   Cardiovascular: Positive for chest pain. Negative for orthopnea and leg swelling.  Gastrointestinal: Negative for nausea and vomiting.  Neurological: Positive for tremors (tremor in left hand during stress test last week). Negative for dizziness.    There were no vitals taken for this visit. Physical Exam  Constitutional: He is oriented to person, place, and time. He appears well-developed. No distress.  HENT:  Head: Normocephalic and atraumatic.  Eyes: EOM are normal. Pupils are equal, round, and reactive to light.  Neck: Normal range of motion. Neck supple.  Cardiovascular: Normal rate and regular rhythm.   No murmur heard. Pulses:      Radial pulses are 2+ on the right side, and 2+ on the left side.       Dorsalis pedis  pulses are 2+ on the right side, and 2+ on the left side.  Respiratory: Effort normal and breath sounds normal. He has no wheezes. He has no rales.  GI: Bowel sounds are normal.  Musculoskeletal: He exhibits no edema (no lower extremity edema).  Neurological: He is alert and oriented to person, place, and time. He exhibits normal muscle tone.  Skin: Skin is warm and dry.  Psychiatric: He has a normal mood and affect.      Assessment/Plan Patient Active [facility]  Problem List: Chest pain (06/30/2011) S/P CABG x 4: 04/01/11-(LIMA to LAD, SVG to PL branch of RCA, Sequential SVG to OM1/2) (04/07/2011) HYPERTENSION (10/25/2006) HYPERLIPIDEMIA (10/25/2006) RENAL INSUFFICIENCY, Cr 3.7 (05/22/2008) CEREBROVASCULAR ACCIDENT, HX OF CVA X 4 (10/25/2006)  Plan:  The patient will be admitted to the step down unit. I will obtain a CBC, basic metabolic panel, PT INR, cardiac enzymes now and every 8 hours x3. Start IV heparin per pharmacy. Patient will be made n.p.o. for left heart catheterization which hopefully will be today.  We'll continue all appropriate home medications. Patient already had aspirin today.  Chest pain is resolved.   HAGER,BRYAN W 06/30/2011, 11:04 AM   I have seen and examined the patient along with Wilburt Finlay, PA.  I have reviewed the chart, notes and new data.  I agree with PA's note.  Key new complaints: Now angina free Key examination changes: no signs of CHF Key new findings / data: marked anterolateral ST-T changes are most consistent with ischemia in the LIMA-LAD distribution, but recent nuclear study documented a small area of inferolateral ischemia (circumflex territory).  PLAN: Unstable angina with high risk  ECG changes. Unfortunately the hospital is full (no ICU, stepdown or telemetry beds are available). Will reroute to the ED to allow initiation of work-up and treatment. Plan IV heparin and NTG and cath today. Note history of significant CKD and recent acute renal failure  requiring hemodialysis - the risk of contrast nephrotoxicity is high. Therefore, as long as he remains asymptomatic, may delay angiography if renal function not optimized.  Thurmon Fair, MD, [facility] [phone] 06/30/2011, 12:47 PM

## 2011-06-30 NOTE — Progress Notes (Addendum)
Pt ac/o chest discomfort while walking on treadmill.  Pt rates 6/10  Describes as sharp pain.  Bp-200/110.  Pt brought to treatment room laid down, BP recheck-180/100 rates 3/10.  O2 4l via [place] applied.   BP -130/90 pt reports relief from discomfort.  PC to Dr. Hazle Coca office.  LM for triage to return call ASAP.  Stat EKG obtained.  EKG faxed for Dr. Allyson Sabal to review.  Spoke to [facility], who advised per Dr. Allyson Sabal to notify Huey Bienenstock, PA, hospital provider today.  Arlys John notified. Will come to evaluate pt.  After assessment by Huey Bienenstock, PA decision made to direct admit pt to step down bed for stable angina. " Keep pt NPO for possible cath today" v.o. Huey Bienenstock, PA/Corrine Tillis,RN Addenedum:  Step down bed uanvailable, per Huey Bienenstock, PA and Dr. Royann Shivers pt transported to ED via stretcher on O2 2L.  Huey Bienenstock, PA made aware of pt shellfish allergy.

## 2011-06-30 NOTE — Progress Notes (Signed)
ANTICOAGULATION CONSULT NOTE - Follow Up Consult  Pharmacy Consult for Bivalrudin/Heparin Indication: chest pain/ACS  Allergies  Allergen Reactions  . Shrimp (Shellfish Allergy) Shortness Of Breath  . Atorvastatin Other (See Comments)    REACTION: weakness  . Insulins Other (See Comments)    Patient unsure as to the kind of insulin but states that it has previously caused seizures. This occurred in the hospital in 2006; but in most recent hospitalization (Jan 2013) received insulin but did not have reaction  . Simvastatin Other (See Comments)    REACTION: abnormal liver tests   Vital Signs: Temp: 98.4 F (36.9 C) (04/15 1233) Temp src: Oral (04/15 1233) BP: 167/92 mmHg (04/15 1345) Pulse Rate: 61  (04/15 1409)  Labs:  Basename 06/30/11 1258 06/30/11 1255  HGB -- 12.1*  HCT -- 37.1*  PLT -- 291  APTT -- --  LABPROT 14.4 --  INR 1.10 --  HEPARINUNFRC -- --  CREATININE 3.43* --  CKTOTAL 266* --  CKMB 16.9* --  TROPONINI 2.84* --   Estimated Creatinine Clearance: 32.8 ml/min (by C-G formula based on Cr of 3.43).  Medications:  Bivalrudin @ 0.25mg /kg/hr  Assessment: 51yom with recent hx CABG x4 is now s/p cath with successful PTCA via SVG of OM. He is to resume angiomax for 4 hours post PCI (to end @ 1945), per RN the sheath will be pulled around 2145, and then heparin to resume 4 hours after sheath is pulled. He is awaiting further PCI to the distal limb of the SVG to the OM1/OM 2.    Goal of Therapy:  Heparin level 0.3-0.7 units/ml   Plan:  1) Continue Bivalrudin @ 0.25mg /kg/hr until 1945 tonight 2) At 0200 tomorrow, begin heparin at 1400 units/hr with NO bolus 3) 6h heparin level 4) Follow up plans for next cath  Fredrik Rigger 06/30/2011,5:35 PM

## 2011-06-30 NOTE — CV Procedure (Signed)
PCI Note:  PTCA of OM2 via SVG   [name], 51 y.o., male  Full note dictated;  See diagram  DICTATION # [id number], [id number]  Successful PTCA via SVG of OM@ at anastamosis site  ( very small vessel) with 2.0 x 12 Emerge balloon dilated up to 2.03 mm  with inflations up to 3 minutes:  99% to 20 - 30%.  Brillinta 180 mg, Angiomax, IC and IV NTG (titrated up to 50 micrograms).  Hydrate aggressively as tolerates with Cr 3.4.  Lennette Bihari, MD, [facility] 06/30/2011 4:12 PM

## 2011-06-30 NOTE — Cardiovascular Report (Signed)
[name], [name]              ACCOUNT NO.:  [id number]  MEDICAL RECORD NO.:  [id number]  LOCATION:  REHS                         FACILITY:  [facility]  PHYSICIAN:  Nicki Guadalajara, M.D.     DATE OF BIRTH:  [birth year]  DATE OF PROCEDURE:  06/30/2011 DATE OF DISCHARGE:                           CARDIAC CATHETERIZATION   PROCEDURE:  Percutaneous transluminal coronary angioplasty of OM-2 vessel fills via the SVG, sequential limb, which had supplied the OM-1 and OM-2 vessel.  INDICATIONS:  [name] is a 51 year old African American gentleman who has a history of significant renal insufficiency with baseline creatinines in the 3s.  He has history of hypertension, history of remote CVA in the past.  In January 2013, he was found to have severe multivessel CAD.  He ultimately underwent CABG revascularization surgery and had a LIMA placed to diffusely diseased LAD, sequential graft to the OM and OM-2, which were small caliber vessels and also a graft to his right coronary artery.  Apparently, the patient had initially required dialysis postoperatively.  The surgery was done on April 01, 2011. The patient has seen Dr. Allyson Sabal in followup.  He was in cardiac rehab and developed chest pain with T-wave changes today.  He was seen by Dr. Royann Shivers.  Diagnostic cardiac catheterization was done by Dr. Royann Shivers and I am asked now to attempt to open up the subtotal occlusion of the circumflex 2 vessel at the anastomosis site.  PROCEDURE:  The patient still had his 5-French sheath in place from the diagnostic study performed by Dr. Royann Shivers.  He received an additional 1 mg of Versed plus 25 mcg of fentanyl.  The 5-French sheath was upgraded to a 6-French sheath.  Brilinta 180 mg was administered as well as bivalirudin bolus plus infusion.  ACT was documented to be therapeutic. A 6-French left bypass graft guide was used for the procedure.  The patient was hypertensive.  At the start, he was on IV  nitroglycerin at bedtime 5 mcg.  This was progressively titrated during the procedure and was increased up to 50 mcg.  Scouty angiography again confirmed the 99% stenosis with a very small circumflex marginal 2 vessel, which was not felt to be large enough to stent.  The Prowater wire was able to cross the subtotal stenosis and a 2.0 x 12-mm Emerge balloon was used for dilatation.  Initially, very low-level inflations at 1 and 2 atmospheres were made for short duration.  Ultimately, longer inflations were done with one inflation lasting for 2 minutes and then another inflation at 6 atmospheres lasting for 3 minutes.  This corresponded to a 2.03-mm balloon size.  Scouty angiography confirmed an excellent angiographic balloon results.  There was significant disparity of vessel size between the very large graft and a very diminutive circumflex system.  The patient left the catheterization laboratory with stable hemodynamics, chest pain free.  HEMODYNAMIC DATA:  Central aortic pressure was 160/106.  Again, IV nitroglycerin was titrated up to 50 mcg.  He also received several doses of intracoronary nitroglycerin.  Please refer to Dr. Erin Hearing diagnostic cardiac catheterization report.  At the start of the intervention, the vein graft was a large sequential graft that was  greater than 4.0 in size and was a sequential graft anastomosing into the OM-1 and OM-2 vessel.  There was 70% bifurcation stenosis in the very distal portion of the OM-1 branch in a very small caliber area of the vessel.  The sequential limb of the graft hooked into the OM-2 vessel just beyond the previously noted 80% narrowing.  However, just after the graft anastomosis, the circumflex OM- 2 vessel was 99% stenosed with TIMI-2 flow.  Again, this vessel was too small to stent.  Following balloon dilatation, an ultimate dilatation up to 2.03 mm for 3 minutes.  Scouty angiography confirmed very good angiographic result.   The 99% stenosis was reduced to 20-30%.  There was no evidence for dissection.  There was now brisk TIMI-3 flow distally.  IMPRESSION: 1. Successful percutaneous transluminal coronary angioplasty via the     sequential saphenous vein graft, which had supplied the OM-1 and OM-     2 vessel with a 99% OM-2 anastomosis stenosis being reduced to 20-     30% with stent balloon dilatation lasting up to 3 minutes and 2.03     mm in size. 2. Bivalirudin/180 mg of oral Brilinta/titrating doses to intravenous     nitroglycerin up to 50 mcg and intracoronary nitroglycerin.          ______________________________ Nicki Guadalajara, M.D.     TK/MEDQ  D:  06/30/2011  T:  06/30/2011  Job:  [id number]

## 2011-07-01 ENCOUNTER — Encounter (HOSPITAL_COMMUNITY): Payer: Self-pay | Admitting: Cardiology

## 2011-07-01 DIAGNOSIS — I214 Non-ST elevation (NSTEMI) myocardial infarction: Secondary | ICD-10-CM

## 2011-07-01 DIAGNOSIS — N184 Chronic kidney disease, stage 4 (severe): Secondary | ICD-10-CM | POA: Diagnosis present

## 2011-07-01 DIAGNOSIS — Z9861 Coronary angioplasty status: Secondary | ICD-10-CM

## 2011-07-01 HISTORY — DX: Coronary angioplasty status: Z98.61

## 2011-07-01 HISTORY — DX: Non-ST elevation (NSTEMI) myocardial infarction: I21.4

## 2011-07-01 LAB — TSH: TSH: 0.414 u[IU]/mL (ref 0.350–4.500)

## 2011-07-01 LAB — BASIC METABOLIC PANEL
Chloride: 106 mEq/L (ref 96–112)
Creatinine, Ser: 3.43 mg/dL — ABNORMAL HIGH (ref 0.50–1.35)
GFR calc Af Amer: 22 mL/min — ABNORMAL LOW (ref >90)
GFR calc non Af Amer: 19 mL/min — ABNORMAL LOW (ref >90)
Potassium: 4 mEq/L (ref 3.5–5.1)

## 2011-07-01 LAB — CARDIAC PANEL(CRET KIN+CKTOT+MB+TROPI)
CK, MB: 3.9 ng/mL (ref 0.3–4.0)
Relative Index: 3.6 — ABNORMAL HIGH (ref 0.0–2.5)
Total CK: 132 U/L (ref 7–232)
Troponin I: 2.59 ng/mL (ref <0.30)

## 2011-07-01 LAB — PROTIME-INR: Prothrombin Time: 15.8 seconds — ABNORMAL HIGH (ref 11.6–15.2)

## 2011-07-01 LAB — LIPID PANEL
Cholesterol: 107 mg/dL (ref 0–200)
Total CHOL/HDL Ratio: 3.7 RATIO
Triglycerides: 124 mg/dL (ref <150)

## 2011-07-01 LAB — GLUCOSE, CAPILLARY

## 2011-07-01 MED ORDER — HEPARIN (PORCINE) IN NACL 100-0.45 UNIT/ML-% IJ SOLN
1400.0000 [IU]/h | INTRAMUSCULAR | Status: DC
  Administered 2011-07-01 (×2): 1400 [IU]/h via INTRAVENOUS
  Filled 2011-07-01 (×2): qty 250

## 2011-07-01 MED ORDER — HEPARIN (PORCINE) IN NACL 100-0.45 UNIT/ML-% IJ SOLN
1550.0000 [IU]/h | INTRAMUSCULAR | Status: DC
  Administered 2011-07-01 – 2011-07-03 (×4): 1550 [IU]/h via INTRAVENOUS
  Filled 2011-07-01 (×6): qty 250

## 2011-07-01 MED ORDER — METOPROLOL TARTRATE 50 MG PO TABS
50.0000 mg | ORAL_TABLET | Freq: Three times a day (TID) | ORAL | Status: DC
  Administered 2011-07-01 – 2011-07-07 (×18): 50 mg via ORAL
  Filled 2011-07-01 (×24): qty 1

## 2011-07-01 MED ORDER — AMLODIPINE BESYLATE 5 MG PO TABS
5.0000 mg | ORAL_TABLET | Freq: Every day | ORAL | Status: DC
  Administered 2011-07-01 – 2011-07-03 (×3): 5 mg via ORAL
  Filled 2011-07-01 (×3): qty 1

## 2011-07-01 MED ORDER — SALINE SPRAY 0.65 % NA SOLN
1.0000 | NASAL | Status: DC | PRN
  Administered 2011-07-01: 1 via NASAL
  Filled 2011-07-01: qty 44

## 2011-07-01 MED ORDER — HEART ATTACK BOUNCING BOOK
Freq: Once | Status: AC
  Administered 2011-07-01: 17:00:00
  Filled 2011-07-01: qty 1

## 2011-07-01 MED ORDER — NITROGLYCERIN IN D5W 200-5 MCG/ML-% IV SOLN
5.0000 ug/min | INTRAVENOUS | Status: DC
  Administered 2011-07-01: 5 ug/min via INTRAVENOUS
  Filled 2011-07-01: qty 250

## 2011-07-01 MED ORDER — SODIUM CHLORIDE 0.9 % IV SOLN
INTRAVENOUS | Status: AC
  Administered 2011-07-02 – 2011-07-03 (×2): via INTRAVENOUS

## 2011-07-01 MED FILL — Dextrose Inj 5%: INTRAVENOUS | Qty: 50 | Status: AC

## 2011-07-01 NOTE — Progress Notes (Signed)
Clinical Social Work Department BRIEF PSYCHOSOCIAL ASSESSMENT 07/01/2011  Patient:  [name], [name]     Account Number:  [id number]     Admit date:  06/30/2011  Clinical Social Worker:  Hulan Fray  Date/Time:  07/01/2011 10:43 AM  Referred by:  RN  Date Referred:  07/01/2011 Referred for  Advanced Directives   Other Referral:   Interview type:  Patient Other interview type:    PSYCHOSOCIAL DATA Living Status:  FAMILY Admitted from facility:   Level of care:   Primary support name:  [name] Primary support relationship to patient:  SIBLING Degree of support available:   supportive    CURRENT CONCERNS Current Concerns  Other - See comment   Other Concerns:   advance directive request.    SOCIAL WORK ASSESSMENT / PLAN Clinical Social Worker received referral for advance directive request. Patient's sister, [name] was at bedside. CSW provided patient with the advance directive packet and MOST form to be filled out with his physician. CSW went through the packet with patient and answered patient's questions. CSW informed patient that hospital can notarize forms during hospital admission and other places that can notarize outside the hospital. Patient stated he did not have any further questions. CSW will sign off as social work intervention is no longer needed. Please re-consult social work if new needs arise.   Assessment/plan status:  No Further Intervention Required Other assessment/ plan:   Information/referral to community resources:   advance directive packet and MOST form    PATIENT'S/FAMILY'S RESPONSE TO PLAN OF CARE: Patient was appreciative of advance directive packet and CSW's assistance.

## 2011-07-01 NOTE — Progress Notes (Signed)
ANTICOAGULATION CONSULT NOTE - Follow Up Consult  Pharmacy Consult for Heparin Indication: CAD  Allergies  Allergen Reactions  . Shrimp (Shellfish Allergy) Shortness Of Breath  . Atorvastatin Other (See Comments)    REACTION: weakness  . Insulins Other (See Comments)    Patient unsure as to the kind of insulin but states that it has previously caused seizures. This occurred in the hospital in 2006; but in most recent hospitalization (Jan 2013) received insulin but did not have reaction  . Simvastatin Other (See Comments)    REACTION: abnormal liver tests   Vital Signs: Temp: 98.1 F (36.7 C) (04/16 1100) Temp src: Oral (04/16 1100) BP: 143/77 mmHg (04/16 1100) Pulse Rate: 77  (04/16 1100)  Labs:  Basename 07/01/11 1256 07/01/11 0824 07/01/11 0425 06/30/11 1258 06/30/11 1255  HGB -- -- -- -- 12.1*  HCT -- -- -- -- 37.1*  PLT -- -- -- -- 291  APTT -- -- -- -- --  LABPROT -- -- 15.8* 14.4 --  INR -- -- 1.23 1.10 --  HEPARINUNFRC 0.29* -- -- -- --  CREATININE -- -- 3.43* 3.43* --  CKTOTAL -- 132 -- 266* --  CKMB -- 4.8* -- 16.9* --  TROPONINI -- 2.97* -- 2.84* --   Estimated Creatinine Clearance: 32.9 ml/min (by C-G formula based on Cr of 3.43).   Assessment: 51yom with recent hx CABG x4 is now s/p cath with successful PTCA via SVG of OM.  Heparin to resumed 4 hours after cath labs due to residual CAD.  Heparin level slightly therapeutic (0.29) on 1400 units/hr.     Goal of Therapy:  Heparin level 0.3-0.7 units/ml   Plan:  1) Increase heparin to 1550 units/hr. 2) F/U AM heparin level. 3) F/U plans for continued heparin.  Gardner Candle 07/01/2011,2:26 PM

## 2011-07-01 NOTE — Progress Notes (Signed)
   CARE MANAGEMENT NOTE 07/01/2011  Patient:  [name], [name]   Account Number:  [id number]  Date Initiated:  07/02/2011  Documentation initiated by:  GRAVES-BIGELOW,Jasdeep Dejarnett  Subjective/Objective Assessment:   Pt admitted with cp. S/p LHC. Pt has family support. Pt states he uses [employer].     Action/Plan:   CM will call pharmacy to make sure they have medication available and they do. Pt was given discount card for 30 day free supply- please wirte Rx with 30 day free no refills and then the original with refills. Please fill out pt assist form.   Anticipated DC Date:  07/03/2011   Anticipated DC Plan:  HOME/SELF CARE      DC Planning Services  CM consult      Choice offered to / List presented to:             Status of service:  Completed, signed off Medicare Important Message given?   (If response is "NO", the following Medicare IM given date fields will be blank) Date Medicare IM given:   Date Additional Medicare IM given:    Discharge Disposition:  HOME/SELF CARE  Per UR Regulation:    If discussed at Long Length of Stay Meetings, dates discussed:    Comments:  07-01-11 1036 Tomi Bamberger, RN,BSN [phone] Pt's sister had questions about crestor as well. CM did give pt a 30 day free card for crestor. Not sure if he will be d/c on this medication. If pt is d/c on this medicaiton will need a seperate Rx for 30 day supply. Please call CM when pt is ready to d/c in order for forms to be faxed. Thanks

## 2011-07-01 NOTE — Progress Notes (Signed)
Subjective: No complaints, no chest pain   Objective: Vital signs in last 24 hours: Temp:  [98.4 F (36.9 C)-98.9 F (37.2 C)] 98.7 F (37.1 C) (04/16 0400) Pulse Rate:  [50-102] 78  (04/16 0700) Resp:  [12-20] 16  (04/16 0009) BP: (124-192)/(63-102) 160/87 mmHg (04/16 0700) SpO2:  [97 %-100 %] 99 % (04/16 0700) Weight:  [111 kg (244 lb 11.4 oz)-111.5 kg (245 lb 13 oz)] 111.5 kg (245 lb 13 oz) (04/16 0726) Weight change:  Last BM Date: 06/30/11 Intake/Output from previous day: 04/15 0701 - 04/16 0700 In: 2695.6 [P.O.:860; I.V.:1835.6] Out: 1200 [Urine:1200] Intake/Output this shift: Total I/O In: -  Out: 200 [Urine:200]  PE: General:alert, oriented AAM No acute distress.   Heart:S1S2, RRR, no murmur, gallup rub or click. Lungs:clear RXV:QMGQ, non tender, + BS Ext:no edema, rt./ groin cath site without hematoma, or bruise, 2 + pedal pulases  Neuro:alert and oriented.    Lab Results:  Basename 06/30/11 1255  WBC 7.4  HGB 12.1*  HCT 37.1*  PLT 291   BMET  Basename 07/01/11 0425 06/30/11 1258  NA 138 139  K 4.0 4.4  CL 106 105  CO2 21 21  GLUCOSE 146* 77  BUN 39* 40*  CREATININE 3.43* 3.43*  CALCIUM 8.9 10.1    Basename 06/30/11 1258  TROPONINI 2.84*    Lab Results  Component Value Date   CHOL 107 07/01/2011   HDL 29* 07/01/2011   LDLCALC 53 07/01/2011   LDLDIRECT 129.4 06/23/2008   TRIG 124 07/01/2011   CHOLHDL 3.7 07/01/2011   Lab Results  Component Value Date   HGBA1C 6.3* 06/30/2011     Lab Results  Component Value Date   TSH 0.414 06/30/2011    Hepatic Function Panel No results found for this basename: PROT,ALBUMIN,AST,ALT,ALKPHOS,BILITOT,BILIDIR,IBILI in the last 72 hours  Basename 07/01/11 0425  CHOL 107   No results found for this basename: PROTIME in the last 72 hours    EKG: Orders placed during the hospital encounter of 06/30/11  . EKG 12-LEAD  . EKG 12-LEAD  . EKG 12-LEAD  . EKG 12-LEAD  . EKG 12-LEAD  . EKG 12-LEAD  . EKG  12-LEAD    Studies/Results:  06/30/11 Cardiac cath: Mr. Scheiber has remarkably severe native coronary artery and a vein graft disease in the distribution of all 3 coronary arteries. A clear-cut culprit is not obvious but the distal limb of the saphenous vein graft to the OM1/OM 2 appears the most likely candidate. This coincides with I nerve ischemia seen on nuclear scintigraphy. It does not fit perfectly with the EKG changes which be more in keeping with LAD artery disease.  The LAD vessel itself cannot be approached via the native left main coronary artery. The LIMA bypass is a very tortuous vessel which should make intervention on the LAD artery very difficult.   and PCI:  Successful PTCA via SVG of OM@ at anastamosis site ( very small vessel) with 2.0 x 12 Emerge balloon dilated up to 2.03 mm with inflations up to 3 minutes: 99% to 20 - 30%.   Medications: I have reviewed the patient's current medications.    Marland Kitchen aspirin EC  81 mg Oral Daily  . atorvastatin  10 mg Oral q1800  . bivalirudin      . cloNIDine  0.2 mg Oral TID  . febuxostat  40 mg Oral BID WC  . fentaNYL      . furosemide  40 mg Oral Daily  . heparin      .  hydrALAZINE  10 mg Intravenous Once  . hydrALAZINE  10 mg Oral TID  . lidocaine      . metoprolol  50 mg Oral BID  . midazolam      . midazolam      . nitroGLYCERIN      . omega-3 acid ethyl esters  2 g Oral Daily  . polysaccharide iron  150 mg Oral Daily  . sodium chloride  3 mL Intravenous Q12H  . terazosin  10 mg Oral QHS  . Ticagrelor      . Ticagrelor  90 mg Oral BID  . DISCONTD: aspirin  324 mg Oral NOW  . DISCONTD: aspirin  300 mg Rectal NOW  . DISCONTD: heparin  2,000 Units Intravenous Once   Assessment/Plan: Patient Active Problem List  Diagnoses  . HYPERLIPIDEMIA  . ANEMIA-NOS  . DEPRESSION  . HYPERTENSION, accelerated  . PERIPHERAL VASCULAR DISEASE  . HEMORRHOIDS  . ALLERGIC RHINITIS  . ACHALASIA  . GERD  . RENAL INSUFFICIENCY, Cr 3.7  .  DEGENERATIVE JOINT DISEASE, SHOULDER  . Headache  . CEREBROVASCULAR ACCIDENT, HX OF CVA X 4  . RENAL CALCULUS, HX OF  . Gout  . CAD, 3 vessel significant disease at cath this admission. for CABG this admission  . 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 Preventative health care  . Impaired glucose tolerance  . Bladder neck obstruction  . Stable angina: with acute T wave inversion.And unstable angina  . S/P PTCA (percutaneous transluminal coronary angioplasty), 06/30/11  . NSTEMI (non-ST elevated myocardial infarction), 06/30/11   PLAN:  IV NTG at 50 mcg due to elevated BP. Give am meds and wean NTG. Increase Lopressor to 50 mg every 8 hours. Hold Lasix for now and recheck BMP in AM. IV Heparin has been restarted. Once NTG stopped then up in chair.  + disease in vein grafts. With PTCA to SVG to the OM1/OM2  Stable angina progressing to unstable angina with accelerated HTN and positive CE. Abnormal EKG with significant T wave inversion.  Today with increased T wave inversions.  NSTEMI this admit with elevated cardiac enzymes.  Will do serial cardiac enzymes. Continued Renal insuff CKD stage 4.  LOS: 1 day   INGOLD,LAURA R 07/01/2011, 8:01 AM  .Agree with note written by Nada Boozer RNP  Pt admitted with signif CP/USA and EKG changes notable for Anterlateral TWI. NSTEMI. S/P cath revealing garft disease. S/P POBA of LCX OM SVG insertion by Dr. Wonda Cheng. No further CP. On IV hep and NTG. Exam benign. Plan transfer to tele, cycle enzymes. Adjust meds. Home 24-48 hours. Scr stable.  Runell Gess 07/01/2011 10:32 AM

## 2011-07-01 NOTE — Progress Notes (Signed)
BP running 160-180 systolic despite iv hydralazine, IV morphine, and NTG at 90 mcg/min.  Gave Metoprolol, clonidine, hydralazine, and Hytrin po.  Rt femoral sheath removed once SBP<160.  Manual pressure held x 20 min.  Tolerated well.  Rt groin level 0.  Pressure bandage applied.  Activity instructions and when to call RN given.  Pt voiced understanding.

## 2011-07-02 ENCOUNTER — Encounter (HOSPITAL_COMMUNITY): Payer: Medicare Other

## 2011-07-02 LAB — CARDIAC PANEL(CRET KIN+CKTOT+MB+TROPI)
CK, MB: 3 ng/mL (ref 0.3–4.0)
Total CK: 99 U/L (ref 7–232)

## 2011-07-02 LAB — CBC
MCH: 28.9 pg (ref 26.0–34.0)
MCV: 88.9 fL (ref 78.0–100.0)
Platelets: 258 10*3/uL (ref 150–400)
RBC: 3.5 MIL/uL — ABNORMAL LOW (ref 4.22–5.81)

## 2011-07-02 LAB — HEPATIC FUNCTION PANEL
ALT: 19 U/L (ref 0–53)
Alkaline Phosphatase: 44 U/L (ref 39–117)
Bilirubin, Direct: 0.1 mg/dL (ref 0.0–0.3)
Indirect Bilirubin: 0.5 mg/dL (ref 0.3–0.9)
Total Bilirubin: 0.6 mg/dL (ref 0.3–1.2)

## 2011-07-02 LAB — BASIC METABOLIC PANEL
BUN: 34 mg/dL — ABNORMAL HIGH (ref 6–23)
Chloride: 109 mEq/L (ref 96–112)
Creatinine, Ser: 3.81 mg/dL — ABNORMAL HIGH (ref 0.50–1.35)
GFR calc Af Amer: 20 mL/min — ABNORMAL LOW (ref >90)
Glucose, Bld: 122 mg/dL — ABNORMAL HIGH (ref 70–99)

## 2011-07-02 MED ORDER — NON FORMULARY: 40.0000 mg | Freq: Every day | Status: DC

## 2011-07-02 MED ORDER — ROSUVASTATIN CALCIUM 40 MG PO TABS
40.0000 mg | ORAL_TABLET | Freq: Every day | ORAL | Status: DC
  Administered 2011-07-02 – 2011-07-07 (×6): 40 mg via ORAL
  Filled 2011-07-02 (×7): qty 1

## 2011-07-02 MED ORDER — HYDRALAZINE HCL 25 MG PO TABS
25.0000 mg | ORAL_TABLET | Freq: Three times a day (TID) | ORAL | Status: DC
  Administered 2011-07-02 – 2011-07-05 (×11): 25 mg via ORAL
  Filled 2011-07-02 (×17): qty 1

## 2011-07-02 MED ORDER — ISOSORBIDE MONONITRATE ER 60 MG PO TB24
60.0000 mg | ORAL_TABLET | Freq: Every day | ORAL | Status: DC
  Administered 2011-07-02 – 2011-07-07 (×5): 60 mg via ORAL
  Filled 2011-07-02 (×8): qty 1

## 2011-07-02 NOTE — Progress Notes (Signed)
Cardiac Rehab [phone] Was going to walk with pt but BP at 180/110 in wrist and 190/120 upper arm. Notified cardiology NP and pt seen by [facility]. Had reviewed pt's ed except ex ed. Pt currently in CRP2 so just needed to review ed.  Will hold ambulation due to elevated BP and need for staged procedure.Adriann Thau DunlapRN

## 2011-07-02 NOTE — Progress Notes (Signed)
Subjective: No complaints   Objective: Vital signs in last 24 hours: Temp:  [98.1 F (36.7 C)-99.9 F (37.7 C)] 98.3 F (36.8 C) (04/17 0408) Pulse Rate:  [67-77] 67  (04/17 0612) Resp:  [16-18] 18  (04/17 0408) BP: (143-171)/(76-93) 163/89 mmHg (04/17 0612) SpO2:  [96 %-99 %] 97 % (04/17 0408) Weight change: 0.5 kg (1 lb 1.6 oz) Last BM Date: 07/01/11 Intake/Output from previous day: 04/16 0701 - 04/17 0700 In: 2563.2 [P.O.:1280; I.V.:1283.2] Out: 1600 [Urine:1600] Intake/Output this shift: Total I/O In: 0  Out: 600 [Urine:600]  PE: General: Heart: Lungs: Abd: Ext: Neuro:   Lab Results:  Basename 07/02/11 0550 06/30/11 1255  WBC 8.4 7.4  HGB 10.1* 12.1*  HCT 31.1* 37.1*  PLT 258 291   BMET  Basename 07/02/11 0550 07/01/11 0425  NA 140 138  K 4.0 4.0  CL 109 106  CO2 22 21  GLUCOSE 122* 146*  BUN 34* 39*  CREATININE 3.81* 3.43*  CALCIUM 9.0 8.9    Basename 07/01/11 2335 07/01/11 1701  TROPONINI 2.16* 2.59*    Lab Results  Component Value Date   CHOL 107 07/01/2011   HDL 29* 07/01/2011   LDLCALC 53 07/01/2011   LDLDIRECT 129.4 06/23/2008   TRIG 124 07/01/2011   CHOLHDL 3.7 07/01/2011   Lab Results  Component Value Date   HGBA1C 6.3* 06/30/2011     Lab Results  Component Value Date   TSH 0.414 06/30/2011    Hepatic Function Panel  Basename 07/02/11 0550  PROT 6.1  ALBUMIN 3.2*  AST 15  ALT 19  ALKPHOS 44  BILITOT 0.6  BILIDIR 0.1  IBILI 0.5    Basename 07/01/11 0425  CHOL 107   No results found for this basename: PROTIME in the last 72 hours    EKG: Orders placed during the hospital encounter of 06/30/11  . EKG 12-LEAD  . EKG 12-LEAD  . EKG 12-LEAD  . EKG 12-LEAD  . EKG 12-LEAD  . EKG 12-LEAD    Studies/Results: No results found.  Medications: I have reviewed the patient's current medications.    Marland Kitchen amLODipine  5 mg Oral Daily  . aspirin EC  81 mg Oral Daily  . atorvastatin  10 mg Oral q1800  . cloNIDine  0.2 mg Oral  TID  . febuxostat  40 mg Oral BID WC  . heart attack bouncing book   Does not apply Once  . hydrALAZINE  10 mg Oral TID  . metoprolol  50 mg Oral Q8H  . omega-3 acid ethyl esters  2 g Oral Daily  . polysaccharide iron  150 mg Oral Daily  . terazosin  10 mg Oral QHS  . Ticagrelor  90 mg Oral BID  . DISCONTD: sodium chloride  3 mL Intravenous Q12H   Assessment/Plan: Patient Active Problem List  Diagnoses  . HYPERLIPIDEMIA  . ANEMIA-NOS  . DEPRESSION  . HYPERTENSION, accelerated  . PERIPHERAL VASCULAR DISEASE  . HEMORRHOIDS  . ALLERGIC RHINITIS  . ACHALASIA  . GERD  . RENAL INSUFFICIENCY, Cr 3.7  . DEGENERATIVE JOINT DISEASE, SHOULDER  . Headache  . CEREBROVASCULAR ACCIDENT, HX OF CVA X 4  . RENAL CALCULUS, HX OF  . Gout  . CAD, 3 vessel significant disease at cath this admission. underwent CABG in Jan 2013  . 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 Preventative health care  . Impaired glucose tolerance  . Bladder neck  obstruction  . Stable angina: with acute T wave inversion.And unstable angina  . S/P PTCA (percutaneous transluminal coronary angioplasty), 06/30/11  . NSTEMI (non-ST elevated myocardial infarction), 06/30/11  . CKD (chronic kidney disease) stage 4, GFR 15-29 ml/min   PLAN:  Was for poss. PCI of LAD today but with elevated Cr. Will hold.  LOS: 2 days   INGOLD,LAURA R 07/02/2011, 9:28 AM   Patient seen and examined. Agree with assessment and plan.  Discussed with Dr. Allyson Sabal.  In addition to subtotal LCX OM which was treated with PTCA via SVG on 06/30/11, there is severe diffuse LAD disease beyond the LIMA graft.  Suspect ECG changes were contributed by both vessels causing ischemia.  Cr 3.8 today.  Hydrate. Defer PCI to LAD via LIMA until possibly on Friday due to Creatinine.  Add oral nitrates now that IV NTG has been DC'd. BP elevated; titrate hydralazine, Lopressor was increased yesterday.  Lennette Bihari, MD,  [facility] 07/02/2011 9:34 AM

## 2011-07-02 NOTE — Progress Notes (Signed)
Clinical Social Worker assisted patient in getting his advance directive documents notarized. Copies of the Living Will and Healthcare Power of Attorney documents have been placed in the shadow chart. CSW will sign off as social work intervention is no longer needed.   Rozetta Nunnery MSW, [employer] [phone]

## 2011-07-02 NOTE — Progress Notes (Signed)
UR Completed. Simmons, Azhane Eckart F [phone]  

## 2011-07-02 NOTE — Progress Notes (Signed)
ANTICOAGULATION CONSULT NOTE - Follow Up Consult  Pharmacy Consult for Heparin Indication: CAD  Allergies  Allergen Reactions  . Shrimp (Shellfish Allergy) Shortness Of Breath  . Atorvastatin Other (See Comments)    REACTION: weakness  . Insulins Other (See Comments)    Patient unsure as to the kind of insulin but states that it has previously caused seizures. This occurred in the hospital in 2006; but in most recent hospitalization (Jan 2013) received insulin but did not have reaction  . Simvastatin Other (See Comments)    REACTION: abnormal liver tests   Vital Signs: Temp: 98.3 F (36.8 C) (04/17 0408) Temp src: Oral (04/17 0408) BP: 163/89 mmHg (04/17 0612) Pulse Rate: 67  (04/17 0612)  Labs:  Alvira Philips 07/02/11 0550 07/01/11 2335 07/01/11 1701 07/01/11 1256 07/01/11 0824 07/01/11 0425 06/30/11 1258 06/30/11 1255  HGB 10.1* -- -- -- -- -- -- 12.1*  HCT 31.1* -- -- -- -- -- -- 37.1*  PLT 258 -- -- -- -- -- -- 291  APTT -- -- -- -- -- -- -- --  LABPROT -- -- -- -- -- 15.8* 14.4 --  INR -- -- -- -- -- 1.23 1.10 --  HEPARINUNFRC 0.43 -- -- 0.29* -- -- -- --  CREATININE 3.81* -- -- -- -- 3.43* 3.43* --  CKTOTAL -- 99 114 -- 132 -- -- --  CKMB -- 3.0 3.9 -- 4.8* -- -- --  TROPONINI -- 2.16* 2.59* -- 2.97* -- -- --   Estimated Creatinine Clearance: 29.6 ml/min (by C-G formula based on Cr of 3.81).   Assessment: 51yom with recent hx CABG x4 is now s/p cath with successful PTCA via SVG of OM.  Heparin resumed 4 hours after cath labs due to residual CAD.  Heparin level therapeutic (0.43) on 1550 units/hr.     Goal of Therapy:  Heparin level 0.3-0.7 units/ml   Plan:  1) Continue heparin gtt at 1550 units/hr. 2) F/U AM heparin level. 3) F/U plans for continued heparin.  Verlene Mayer, PharmD, BCPS Pager [phone] 07/02/2011,8:59 AM

## 2011-07-03 ENCOUNTER — Encounter (HOSPITAL_COMMUNITY): Payer: Self-pay | Admitting: Cardiology

## 2011-07-03 DIAGNOSIS — Z8679 Personal history of other diseases of the circulatory system: Secondary | ICD-10-CM

## 2011-07-03 LAB — CBC
HCT: 30.7 % — ABNORMAL LOW (ref 39.0–52.0)
MCH: 29.7 pg (ref 26.0–34.0)
MCHC: 32.9 g/dL (ref 30.0–36.0)
MCV: 90.3 fL (ref 78.0–100.0)
RDW: 14.3 % (ref 11.5–15.5)

## 2011-07-03 LAB — BASIC METABOLIC PANEL
BUN: 31 mg/dL — ABNORMAL HIGH (ref 6–23)
CO2: 19 mEq/L (ref 19–32)
Calcium: 9 mg/dL (ref 8.4–10.5)
GFR calc non Af Amer: 18 mL/min — ABNORMAL LOW (ref >90)
Glucose, Bld: 127 mg/dL — ABNORMAL HIGH (ref 70–99)

## 2011-07-03 MED ORDER — SODIUM CHLORIDE 0.9 % IV SOLN
1.0000 mL/kg/h | INTRAVENOUS | Status: DC
  Administered 2011-07-03 – 2011-07-04 (×2): 1 mL/kg/h via INTRAVENOUS

## 2011-07-03 MED ORDER — ASPIRIN 81 MG PO CHEW: 324.0000 mg | CHEWABLE_TABLET | ORAL | Status: DC

## 2011-07-03 MED ORDER — FUROSEMIDE 40 MG PO TABS
40.0000 mg | ORAL_TABLET | Freq: Once | ORAL | Status: AC
  Administered 2011-07-03: 40 mg via ORAL
  Filled 2011-07-03: qty 1

## 2011-07-03 MED ORDER — AMLODIPINE BESYLATE 10 MG PO TABS
10.0000 mg | ORAL_TABLET | Freq: Every day | ORAL | Status: DC
  Administered 2011-07-04 – 2011-07-07 (×4): 10 mg via ORAL
  Filled 2011-07-03 (×5): qty 1

## 2011-07-03 MED ORDER — DIAZEPAM 5 MG PO TABS
5.0000 mg | ORAL_TABLET | ORAL | Status: AC
  Administered 2011-07-04: 5 mg via ORAL
  Filled 2011-07-03: qty 1

## 2011-07-03 MED ORDER — SODIUM CHLORIDE 0.9 % IJ SOLN
3.0000 mL | Freq: Two times a day (BID) | INTRAMUSCULAR | Status: DC
  Administered 2011-07-03: 3 mL via INTRAVENOUS

## 2011-07-03 MED ORDER — SODIUM CHLORIDE 0.9 % IJ SOLN
3.0000 mL | INTRAMUSCULAR | Status: DC | PRN
  Administered 2011-07-03: 3 mL via INTRAVENOUS

## 2011-07-03 MED ORDER — SODIUM CHLORIDE 0.9 % IV SOLN: 250.0000 mL | INTRAVENOUS | Status: DC | PRN

## 2011-07-03 NOTE — Progress Notes (Signed)
ANTICOAGULATION CONSULT NOTE - Follow Up Consult  Pharmacy Consult for Heparin Indication: CAD  Allergies  Allergen Reactions  . Shrimp (Shellfish Allergy) Shortness Of Breath  . Atorvastatin Other (See Comments)    REACTION: weakness  . Insulins Other (See Comments)    Patient unsure as to the kind of insulin but states that it has previously caused seizures. This occurred in the hospital in 2006; but in most recent hospitalization (Jan 2013) received insulin but did not have reaction  . Simvastatin Other (See Comments)    REACTION: abnormal liver tests   Vital Signs: Temp: 97.8 F (36.6 C) (04/18 0403) Temp src: Oral (04/18 0403) BP: 157/89 mmHg (04/18 0403) Pulse Rate: 60  (04/18 0403)  Labs:  Basename 07/03/11 0630 07/02/11 0550 07/01/11 2335 07/01/11 1701 07/01/11 1256 07/01/11 0824 07/01/11 0425 06/30/11 1258 06/30/11 1255  HGB 10.1* 10.1* -- -- -- -- -- -- --  HCT 30.7* 31.1* -- -- -- -- -- -- 37.1*  PLT 253 258 -- -- -- -- -- -- 291  APTT -- -- -- -- -- -- -- -- --  LABPROT -- -- -- -- -- -- 15.8* 14.4 --  INR -- -- -- -- -- -- 1.23 1.10 --  HEPARINUNFRC 0.58 0.43 -- -- 0.29* -- -- -- --  CREATININE 3.64* 3.81* -- -- -- -- 3.43* -- --  CKTOTAL -- -- 99 114 -- 132 -- -- --  CKMB -- -- 3.0 3.9 -- 4.8* -- -- --  TROPONINI -- -- 2.16* 2.59* -- 2.97* -- -- --   Estimated Creatinine Clearance: 31.2 ml/min (by C-G formula based on Cr of 3.64).   Assessment: 51yom with recent hx CABG x4 is now s/p cath with successful PTCA via SVG of OM.  Heparin resumed after cath due to residual CAD.  Today Heparin level is therapeutic (0.58) on 1550 units/hr. CBC stable. No bleeding reported.  Plan for PCI to LAD in AM.    Goal of Therapy:  Heparin level 0.3-0.7 units/ml   Plan:  1) Continue heparin gtt at 1550 units/hr. 2) F/U AM heparin level. 3) F/U plans for continued heparin.  Noah Delaine, RPh 07/03/2011,10:24 AM

## 2011-07-03 NOTE — Progress Notes (Signed)
Cardiac Rehabilitation Program Outcomes Report   Orientation:  05/02/2011 Graduate Date:   Discharge Date:  07/03/2011 # of sessions completed: 21  Cardiologist: Wilhemina Cash MD:  Virl Axe Time:  0815  A.  Exercise Program:  Tolerates exercise @ 3.9 METS for 30 minutes and Discharged  B.  Mental Health:    C.  Education/Instruction/Skills  Uses Perceived Exertion Scale and Attended 11 education classes  D.  Nutrition/Weight Control/Body Composition:   Pt lost 1 kg, BMI 33.2   E.  Blood Lipids    Lab Results  Component Value Date   CHOL 107 07/01/2011   HDL 29* 07/01/2011   LDLCALC 53 07/01/2011   LDLDIRECT 129.4 06/23/2008   TRIG 124 07/01/2011   CHOLHDL 3.7 07/01/2011    F.  Lifestyle Changes:    G.  Symptoms noted with exercise:  Angina exertional, Shortness of breath, Fatigue, Resting hypertension and Exertional hypertension  Report Completed By:  Electronically signed by Harriett Sine MS on Thursday July 03 2011 at 1604  Comments:

## 2011-07-03 NOTE — Progress Notes (Signed)
CARDIAC REHAB PHASE I   PRE:  Rate/Rhythm: 64SR  BP:  Supine: 170/90  Sitting:   Standing:    SaO2: 98%RA  MODE:  Ambulation: 350 ft   POST:  Rate/Rhythem: 53SB  BP:  Supine:   Sitting: 150/90  Standing:    SaO2: 99%RA 1055-1115 Pt walked 350 ft on Ra with steady gait. Denied chest pain. HR and BP lower with walk. To recliner with call bell.  Duanne Limerick

## 2011-07-03 NOTE — Progress Notes (Signed)
Subjective: No chest pain  Objective: Vital signs in last 24 hours: Temp:  [97.4 F (36.3 C)-97.8 F (36.6 C)] 97.8 F (36.6 C) (04/18 0403) Pulse Rate:  [60-74] 60  (04/18 0403) Resp:  [18-22] 22  (04/18 0403) BP: (140-159)/(78-89) 157/89 mmHg (04/18 0403) SpO2:  [98 %-100 %] 99 % (04/18 0403) Weight:  [113 kg (249 lb 1.9 oz)] 113 kg (249 lb 1.9 oz) (04/18 0403) Weight change: 1.5 kg (3 lb 4.9 oz) Last BM Date: 07/01/11 Intake/Output from previous day: -1313 04/17 0701 - 04/18 0700 In: 2736.9 [P.O.:840; I.V.:1896.9] Out: 4050 [Urine:4050] Intake/Output this shift: Total I/O In: 436.5 [P.O.:240; I.V.:196.5] Out: 1075 [Urine:1075]  PE: General: alert and oriented X 3, pleasant affect Heart:S1S2, RRR soft systolic murmur Lungs:decreased in bases no rales or wheezes Abd:+BS Ext:no edema, +pedal pulses Neuro:A&O x 3 MAE, FOLLOWS COMMANDS   Lab Results:  Basename 07/03/11 0630 07/02/11 0550  WBC 7.4 8.4  HGB 10.1* 10.1*  HCT 30.7* 31.1*  PLT 253 258   BMET  Basename 07/03/11 0630 07/02/11 0550  NA 139 140  K 4.3 4.0  CL 109 109  CO2 19 22  GLUCOSE 127* 122*  BUN 31* 34*  CREATININE 3.64* 3.81*  CALCIUM 9.0 9.0    Basename 07/01/11 2335 07/01/11 1701  TROPONINI 2.16* 2.59*    Lab Results  Component Value Date   CHOL 107 07/01/2011   HDL 29* 07/01/2011   LDLCALC 53 07/01/2011   LDLDIRECT 129.4 06/23/2008   TRIG 124 07/01/2011   CHOLHDL 3.7 07/01/2011   Lab Results  Component Value Date   HGBA1C 6.3* 06/30/2011     Lab Results  Component Value Date   TSH 0.414 06/30/2011    Hepatic Function Panel  Basename 07/02/11 0550  PROT 6.1  ALBUMIN 3.2*  AST 15  ALT 19  ALKPHOS 44  BILITOT 0.6  BILIDIR 0.1  IBILI 0.5    Basename 07/01/11 0425  CHOL 107   No results found for this basename: PROTIME in the last 72 hours    EKG: reviewed by MD below    Medications: I have reviewed the patient's current medications.    Marland Kitchen amLODipine  10 mg Oral  Daily  . aspirin EC  81 mg Oral Daily  . cloNIDine  0.2 mg Oral TID  . febuxostat  40 mg Oral BID WC  . hydrALAZINE  25 mg Oral TID  . isosorbide mononitrate  60 mg Oral Daily  . metoprolol  50 mg Oral Q8H  . omega-3 acid ethyl esters  2 g Oral Daily  . polysaccharide iron  150 mg Oral Daily  . rosuvastatin  40 mg Oral q1800  . terazosin  10 mg Oral QHS  . Ticagrelor  90 mg Oral BID  . DISCONTD: amLODipine  5 mg Oral Daily   Assessment/Plan: Principal Problem:  *NSTEMI (non-ST elevated myocardial infarction), 06/30/11 Active Problems:   CAD, 3 vessel significant disease at cath this admission. underwent CABG in Jan 2013   Stable angina: with acute T wave inversion.And unstable angina   CKD (chronic kidney disease) stage 4, GFR 15-29 ml/min   HYPERLIPIDEMIA   HYPERTENSION, accelerated   S/P PTCA (percutaneous transluminal coronary angioplasty), 06/30/11   History of diastolic dysfunction, grade 2 by echo   RENAL INSUFFICIENCY, Cr 3.7   S/P CABG x 4: 04/01/11-(LIMA to LAD, SVG to PL branch of RCA, Sequential SVG to OM1/2)   PLAN: Cr. Improved from yest. for today.  Plan is for  PCI to LAD in am.  Has been on IV fluids 75cc/hr x 20 hrs and now 50cc/hr.   Will add na bicarb to cath orders.  His home Lasix is being held due to cath and Cr.  IV Heparin continues.  HTN -improved control with BP medication adjustments( nitate/ increase of hydralizine, increase betablocker and addition of norvasc.)  Pt does not tolerate lipitor so placed him back on home crestor.    Borderline diabetic- Hgb A1C of 6.3 will change diet Pt aware of this status and has been, he has made dietary changes at home. If glucose is elevated after diet change may need SSI.  LOS: 3 days   Driana Dazey W 07/03/2011, 11:23 AM  ATTENDING ATTESTATION:  I have seen and examined the patient along with Nada Boozer, PA.  I have reviewed the chart, notes and new data.  I agree with Laura's note.  Brief  Description: 51 y/o man with CKD-II-IV and CAD s/p CABG in jan 2013 admitted for ACS/NSTEMI --> cath with PTCA of  SVG-OM anastomotic lesion for Inferolat TWI - still with persistent Ant-lat TWI & LAD lesion post LIMA.      Key new complaints: concerned re wgt gain & a bit SOB / Orthopnea   Key examination changes: agree with exam  - no rales   Key new findings / data: Cr now 3.64 - down from 3.81; CBC stable  ECG reviewed - ant Lat deep TWI still present,but inferior TWI no longer present  PLAN:  PCI of mid-distal LAD via LIMA tomorrow provided Cr stable; already on ASA/Brilinta (form filled out for d/c planning)  Continue to titrate HTN Rx - increased Amlodipine to 10mg  (next to titrate would be Hydralazine)  With continuous IVF for pre-cath, will give PO Lasix dose today (hold tomorrow)  IV Heparin still continued as significant LAD lesion still present.  Back on Crestor  OK to ambulate gently in hall.   Marykay Lex, M.D., M.S. [facility] [address] [place], [place]  [address]  [phone]  07/03/2011 11:23 AM

## 2011-07-04 ENCOUNTER — Encounter (HOSPITAL_COMMUNITY): Payer: Medicare Other

## 2011-07-04 ENCOUNTER — Encounter (HOSPITAL_COMMUNITY)
Admission: EM | Disposition: A | Discharge status: Home / Self Care | Payer: Self-pay | Source: Home / Self Care | Attending: Cardiovascular Disease

## 2011-07-04 ENCOUNTER — Encounter (HOSPITAL_COMMUNITY): Payer: Self-pay | Admitting: Cardiovascular Disease

## 2011-07-04 DIAGNOSIS — Z955 Presence of coronary angioplasty implant and graft: Secondary | ICD-10-CM

## 2011-07-04 HISTORY — DX: Presence of coronary angioplasty implant and graft: Z95.5

## 2011-07-04 HISTORY — PX: PERCUTANEOUS CORONARY STENT INTERVENTION (PCI-S): SHX5485

## 2011-07-04 LAB — CBC
HCT: 34.6 % — ABNORMAL LOW (ref 39.0–52.0)
Hemoglobin: 11.5 g/dL — ABNORMAL LOW (ref 13.0–17.0)
RBC: 3.86 MIL/uL — ABNORMAL LOW (ref 4.22–5.81)

## 2011-07-04 LAB — PROTIME-INR
INR: 1.03 (ref 0.00–1.49)
Prothrombin Time: 13.7 seconds (ref 11.6–15.2)

## 2011-07-04 LAB — POCT ACTIVATED CLOTTING TIME
Activated Clotting Time: 187 seconds
Activated Clotting Time: 160 seconds

## 2011-07-04 LAB — PLATELET INHIBITION P2Y12: Platelet Function  P2Y12: 109 [PRU] — ABNORMAL LOW (ref 194–418)

## 2011-07-04 LAB — HEPARIN LEVEL (UNFRACTIONATED): Heparin Unfractionated: 0.56 IU/mL (ref 0.30–0.70)

## 2011-07-04 LAB — BASIC METABOLIC PANEL
CO2: 19 mEq/L (ref 19–32)
Chloride: 107 mEq/L (ref 96–112)
Glucose, Bld: 123 mg/dL — ABNORMAL HIGH (ref 70–99)
Sodium: 140 mEq/L (ref 135–145)

## 2011-07-04 SURGERY — PERCUTANEOUS CORONARY STENT INTERVENTION (PCI-S)
Anesthesia: LOCAL

## 2011-07-04 MED ORDER — SODIUM CHLORIDE 0.9 % IJ SOLN: 3.0000 mL | INTRAMUSCULAR | Status: DC | PRN

## 2011-07-04 MED ORDER — ASPIRIN 81 MG PO CHEW: 324.0000 mg | CHEWABLE_TABLET | ORAL | Status: DC

## 2011-07-04 MED ORDER — MIDAZOLAM HCL 2 MG/2ML IJ SOLN
INTRAMUSCULAR | Status: AC
  Filled 2011-07-04: qty 2

## 2011-07-04 MED ORDER — SODIUM BICARBONATE 8.4 % IV SOLN
INTRAVENOUS | Status: DC
  Filled 2011-07-04: qty 500

## 2011-07-04 MED ORDER — LIDOCAINE HCL (PF) 1 % IJ SOLN
INTRAMUSCULAR | Status: AC
  Filled 2011-07-04: qty 30

## 2011-07-04 MED ORDER — SODIUM BICARBONATE 8.4 % IV SOLN
INTRAVENOUS | Status: DC
  Filled 2011-07-04: qty 1000

## 2011-07-04 MED ORDER — ASPIRIN EC 81 MG PO TBEC
81.0000 mg | DELAYED_RELEASE_TABLET | Freq: Every day | ORAL | Status: DC
  Administered 2011-07-05 – 2011-07-07 (×3): 81 mg via ORAL
  Filled 2011-07-04 (×3): qty 1

## 2011-07-04 MED ORDER — FENTANYL CITRATE 0.05 MG/ML IJ SOLN
INTRAMUSCULAR | Status: AC
  Filled 2011-07-04: qty 2

## 2011-07-04 MED ORDER — SODIUM CHLORIDE 0.9 % IJ SOLN: 3.0000 mL | Freq: Two times a day (BID) | INTRAMUSCULAR | Status: DC

## 2011-07-04 MED ORDER — SODIUM CHLORIDE 0.9 % IV SOLN
INTRAVENOUS | Status: DC
  Administered 2011-07-04 – 2011-07-06 (×3): via INTRAVENOUS

## 2011-07-04 MED ORDER — NITROGLYCERIN IN D5W 200-5 MCG/ML-% IV SOLN
2.0000 ug/min | INTRAVENOUS | Status: DC
  Administered 2011-07-04: 60 ug/min via INTRAVENOUS

## 2011-07-04 MED ORDER — TICAGRELOR 90 MG PO TABS
ORAL_TABLET | ORAL | Status: AC
  Administered 2011-07-04: 90 mg via ORAL
  Filled 2011-07-04: qty 1

## 2011-07-04 MED ORDER — HEPARIN (PORCINE) IN NACL 2-0.9 UNIT/ML-% IJ SOLN
INTRAMUSCULAR | Status: AC
  Filled 2011-07-04: qty 2000

## 2011-07-04 MED ORDER — DIPHENHYDRAMINE HCL 50 MG/ML IJ SOLN
INTRAMUSCULAR | Status: AC
  Filled 2011-07-04: qty 1

## 2011-07-04 MED ORDER — SODIUM CHLORIDE 0.9 % IV SOLN: 250.0000 mL | INTRAVENOUS | Status: DC | PRN

## 2011-07-04 MED ORDER — TICAGRELOR 90 MG PO TABS
90.0000 mg | ORAL_TABLET | Freq: Two times a day (BID) | ORAL | Status: DC
  Administered 2011-07-04 – 2011-07-07 (×6): 90 mg via ORAL
  Filled 2011-07-04 (×7): qty 1

## 2011-07-04 MED ORDER — ONDANSETRON HCL 4 MG/2ML IJ SOLN: 4.0000 mg | Freq: Four times a day (QID) | INTRAMUSCULAR | Status: DC | PRN

## 2011-07-04 MED ORDER — ACETAMINOPHEN 325 MG PO TABS
650.0000 mg | ORAL_TABLET | ORAL | Status: DC | PRN
  Administered 2011-07-07: 650 mg via ORAL
  Filled 2011-07-04: qty 2

## 2011-07-04 MED ORDER — BIVALIRUDIN 250 MG IV SOLR
INTRAVENOUS | Status: AC
  Filled 2011-07-04: qty 250

## 2011-07-04 MED ORDER — NITROGLYCERIN 0.2 MG/ML ON CALL CATH LAB
INTRAVENOUS | Status: AC
  Filled 2011-07-04: qty 1

## 2011-07-04 NOTE — Cardiovascular Report (Signed)
[name], [name]              ACCOUNT NO.:  [id number]  MEDICAL RECORD NO.:  [id number]  LOCATION:  REHS                         FACILITY:  [facility]  PHYSICIAN:  Nicki Guadalajara, M.D.     DATE OF BIRTH:  [birth year]  DATE OF PROCEDURE: DATE OF DISCHARGE:  06/30/2011                           CARDIAC CATHETERIZATION   PROCEDURE:  Percutaneous coronary intervention with percutaneous transluminal coronary angioplasty /stenting of the left anterior descending via the left internal mammary artery graft.  INDICATIONS:  [name] is a 51 year old African American gentleman who has significant chronic renal insufficiency.  He underwent CABG surgery in January 2013, after catheterization revealed significant multivessel CAD.  The patient had diffuse distal LAD disease.  He apparently developed unstable angina symptomatology while at cardiac rehab, and on June 30, 2011, underwent catheterization by Dr. Royann Shivers. At that time, I performed culprit intervention to a 99% stenosis just beyond the anastomosis in the sequential graft supplying the distal circumflex marginal vessel.  The patient also had severe diffuse disease in the LAD beyond the LIMA insertion.  He does have chronic renal insufficiency with baseline creatinines approximately 3.5 range.  The patient's creatinine had increased to 3.8, and now was back to 3.6.  He presents today for intervention to his LAD via the LIMA graft.  PROCEDURE:  The patient was prepped and draped in usual fashion.  His right femoral artery was punctured anteriorly and a 6-French sheath was inserted without difficulty.  Versed 2 mg plus 50 mcg of fentanyl was administered for conscious sedation.  A 6-French LIMA catheter was then inserted.  Selective angiography reconfirmed diffuse segmental 90+ percent stenoses in the mid distal LAD beyond the LIMA insertion as well as high-grade apical stenoses a very small caliber system.  Bivalirudin was  administered.  ACT was documented to be therapeutic.  The patient had been on Brilinta.  He received a 90 mg dose in the lab for this morning's dose.  The patient was hypertensive and he was started on IV nitroglycerin.  During the procedure, IV nitroglycerin was titrated up to 60 mcg.  He also received numerous doses of intracoronary nitroglycerin.  A Prowater wire was advanced via the LIMA catheter down the LIMA graft into the LAD distally.  A 2.25 x 20 mm Emerge balloon was used for balloon dilatation both at the anastomosis site and further down into the more distal LAD.  This was dilated to approximately 2.25 to 2.3 mm.  Two very low-level inflations corresponding to approximately 2.1 mm was made just proximal to the apical portion, which was 95% stenosed but this was very small caliber with significant risk for dissection, consequently aggressive dilatation was not performed.  A 2.25 x 26 mm Resolute DES stent was then inserted with positioning of the stent at the LIMA anastomosis and covering the diffuse stenoses including the stenosis after the more distally distal diagonal vessel. The apical portion was purposely not stented since this was too small caliber.  Two inflations with the Resolute stent were made up to 12 atmospheres.  A 2.5 x 20 mm noncompliant __________ balloon was then used for post stent dilatation up to 2.42 mm in the stented segment.  Scout angiography confirmed an excellent angiographic result.  There was brisk TIMI-3 flow.  There was no evidence for dissection.  Arterial sheath was sutured in place with plans for sheath removal later today.  HEMODYNAMIC DATA:  Central aortic pressure initially was 219/102.  This did improve to approximately 180/90 on 60 mcg of intracoronary nitroglycerin.  ANGIOGRAPHIC DATA:  The left internal mammary artery was a large graft which anastomosed into the mid LAD.  There was diffuse 90% stenoses in the LAD just beyond the  anastomosis followed by 80% stenosis before a smaller diagonal vessel.  There was 70% stenosis just beyond this diagonal vessel.  The apical portion of the LAD was very small and diminutive and had 90% stenosis and then in the very inferior portion the LAD was diminutive and again was diffusely narrowed.  Following successful coronary intervention with PTCA and stenting of the mid to mid distal LAD from the LIMA anastomosis to beyond the diagonal vessel, the segmental stenoses were reduced to 0% with ultimate insertion of a 2.25 x 26 mm Resolute DES stent post dilated to approximately 2.43 mm. The apical LAD lesion was reduced from 90-95% to approximately 50% with low level balloon dilatation purposefully not being aggressive.  No dilatation was made to the diffuse disease in the inferior aspect beyond the apex in a very small diffusely diseased distal LAD system supplying the apical inferior wall.  IMPRESSION:  Successful percutaneous coronary intervention to the mid distal left anterior descending via the left internal mammary artery graft with diffuse post anastomoses stenoses of 90, 80 and 70% reduced to 0% with ultimate insertion of a 2.25 x 26 mm Resolute drug-eluting stent post dilated to 2.43 mm and a 95% apical left anterior descending stenosis treated with low-level percutaneous transluminal coronary angioplasty to approximately 50-60% done with bivalirudin/Brilinta/IC and IV nitroglycerin.          ______________________________ Nicki Guadalajara, M.D.     TK/MEDQ  D:  07/04/2011  T:  07/04/2011  Job:  [id number]  cc:   Thurmon Fair, MD

## 2011-07-04 NOTE — Care Management (Signed)
07-04-11 [phone] Care Manager did fax patient assistance forms to  [employer] for Brilinta. It usually will take at least 3 weeks before notice of approval for medications. CM will continue to f/u for additional needs for d/c. Thanks. Gala Lewandowsky, RN,BSN [phone]

## 2011-07-04 NOTE — Progress Notes (Signed)
ANTICOAGULATION CONSULT NOTE - Follow Up Consult  Pharmacy Consult for Heparin (d/c after cath today) Indication: CAD  Allergies  Allergen Reactions  . Shrimp (Shellfish Allergy) Shortness Of Breath  . Atorvastatin Other (See Comments)    REACTION: weakness  . Insulins Other (See Comments)    Patient unsure as to the kind of insulin but states that it has previously caused seizures. This occurred in the hospital in 2006; but in most recent hospitalization (Jan 2013) received insulin but did not have reaction  . Simvastatin Other (See Comments)    REACTION: abnormal liver tests   Vital Signs: Temp: 97.7 F (36.5 C) (04/19 1304) Temp src: Oral (04/19 1304) BP: 148/80 mmHg (04/19 1304) Pulse Rate: 46  (04/19 1304)  Labs:  Basename 07/04/11 0640 07/03/11 0630 07/02/11 0550 07/01/11 2335 07/01/11 1701  HGB 11.5* 10.1* -- -- --  HCT 34.6* 30.7* 31.1* -- --  PLT 302 253 258 -- --  APTT -- -- -- -- --  LABPROT 13.7 -- -- -- --  INR 1.03 -- -- -- --  HEPARINUNFRC 0.56 0.58 0.43 -- --  CREATININE 3.61* 3.64* 3.81* -- --  CKTOTAL -- -- -- 99 114  CKMB -- -- -- 3.0 3.9  TROPONINI -- -- -- 2.16* 2.59*   Estimated Creatinine Clearance: 31.3 ml/min (by C-G formula based on Cr of 3.61).   Assessment: 51yom with recent hx CABG x4 is now s/p cath with successful PTCA via SVG of OM.  Heparin resumed after cath due to residual CAD.  Today Heparin level is therapeutic (0.56) on 1550 units/hr. CBC stable. No bleeding reported.  Now s/p PCI to LAD; heparin turned off.   Goal of Therapy:  Heparin level 0.3-0.7 units/ml   Plan:  1. Will d/c heparin orders and protocol. 2. Pharmacy will sign off.  Reece Leader, Pharm D 07/04/2011 1:41 PM

## 2011-07-04 NOTE — Progress Notes (Addendum)
Subjective: No chest pain  Objective: Vital signs in last 24 hours: Temp:  [97.6 F (36.4 C)-97.9 F (36.6 C)] 97.9 F (36.6 C) (04/19 0453) Pulse Rate:  [53-62] 62  (04/19 0453) Resp:  [18] 18  (04/19 0453) BP: (139-180)/(76-100) 180/100 mmHg (04/19 0722) SpO2:  [97 %-99 %] 99 % (04/19 0453) Weight:  [112.3 kg (247 lb 9.2 oz)] 112.3 kg (247 lb 9.2 oz) (04/19 0453) Weight change: -0.7 kg (-1 lb 8.7 oz) Last BM Date: 07/02/11 Intake/Output from previous day: -4961  04/18 0701 - 04/19 0700 In: 938.5 [P.O.:480; I.V.:458.5] Out: 5900 [Urine:5900] Intake/Output this shift:    PE: General:no complaints Heart:S1S2 RRR, no obvious Murmur, gallup rub or click. Lungs:clear no rales, rhonchi or wheezes Abd:+ BS, soft, non tended Ext:no edema, 2 + pedal pulses Neuro:A&O X 3, MAE, follows commands.   Lab Results:  Basename 07/04/11 0640 07/03/11 0630  WBC 7.1 7.4  HGB 11.5* 10.1*  HCT 34.6* 30.7*  PLT 302 253   BMET  Basename 07/04/11 0640 07/03/11 0630  NA 140 139  K 3.7 4.3  CL 107 109  CO2 19 19  GLUCOSE 123* 127*  BUN 30* 31*  CREATININE 3.61* 3.64*  CALCIUM 9.7 9.0    Basename 07/01/11 2335 07/01/11 1701  TROPONINI 2.16* 2.59*    Lab Results  Component Value Date   CHOL 107 07/01/2011   HDL 29* 07/01/2011   LDLCALC 53 07/01/2011   LDLDIRECT 129.4 06/23/2008   TRIG 124 07/01/2011   CHOLHDL 3.7 07/01/2011   Lab Results  Component Value Date   HGBA1C 6.3* 06/30/2011     Lab Results  Component Value Date   TSH 0.414 06/30/2011    Hepatic Function Panel  Basename 07/02/11 0550  PROT 6.1  ALBUMIN 3.2*  AST 15  ALT 19  ALKPHOS 44  BILITOT 0.6  BILIDIR 0.1  IBILI 0.5   No results found for this basename: CHOL in the last 72 hours No results found for this basename: PROTIME in the last 72 hours    EKG: Orders placed during the hospital encounter of 06/30/11  . EKG 12-LEAD  . EKG 12-LEAD  . EKG 12-LEAD  . EKG 12-LEAD  . EKG 12-LEAD  . EKG 12-LEAD    . EKG    Studies/Results: No results found.  Medications: I have reviewed the patient's current medications.    Marland Kitchen amLODipine  10 mg Oral Daily  . aspirin  324 mg Oral Pre-Cath  . aspirin EC  81 mg Oral Daily  . cloNIDine  0.2 mg Oral TID  . sodium bicarbonate (CIN) infusion   Intravenous Pre-Cath   And  . sodium bicarbonate (CIN) infusion   Intravenous Pre-Cath   And  . sodium bicarbonate (CIN) infusion   Intravenous Pre-Cath  . diazepam  5 mg Oral On Call  . febuxostat  40 mg Oral BID WC  . furosemide  40 mg Oral Once  . hydrALAZINE  25 mg Oral TID  . isosorbide mononitrate  60 mg Oral Daily  . metoprolol  50 mg Oral Q8H  . omega-3 acid ethyl esters  2 g Oral Daily  . polysaccharide iron  150 mg Oral Daily  . rosuvastatin  40 mg Oral q1800  . sodium chloride  3 mL Intravenous Q12H  . terazosin  10 mg Oral QHS  . Ticagrelor  90 mg Oral BID  . DISCONTD: amLODipine  5 mg Oral Daily   Assessment/Plan: Patient Active Problem List  Diagnoses  .  HYPERLIPIDEMIA  . ANEMIA-NOS  . DEPRESSION  . HYPERTENSION, accelerated  . PERIPHERAL VASCULAR DISEASE  . HEMORRHOIDS  . ALLERGIC RHINITIS  . ACHALASIA  . GERD  . RENAL INSUFFICIENCY, Cr 3.7  . DEGENERATIVE JOINT DISEASE, SHOULDER  . Headache  . CEREBROVASCULAR ACCIDENT, HX OF CVA X 4  . RENAL CALCULUS, HX OF  . Gout  . CAD, 3 vessel significant disease at cath this admission. underwent CABG in Jan 2013  . 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 Preventative health care  . Impaired glucose tolerance  . Bladder neck obstruction  . Stable angina: with acute T wave inversion.And unstable angina  . S/P PTCA (percutaneous transluminal coronary angioplasty), 06/30/11  . NSTEMI (non-ST elevated myocardial infarction), 06/30/11  . CKD (chronic kidney disease) stage 4, GFR 15-29 ml/min  . History of diastolic dysfunction, grade 2 by echo   PLAN: For PCI to LAD via LIMA.  Cr. Is  close to admit baseline and BUN is less than admit.  Rec'd Lasix yest with ->4000. Lungs improved for today.  To have bicarb drip per pharmacy for cath. Glucose slightly elevated will check CBGs to monitor.  May need addition of metformin 48 hours after cath.       LOS: 4 days   INGOLD,LAURA R 07/04/2011, 7:46 AM   Patient seen and examined. Agree with assessment and plan. No recurrent CP. Cr improved to near baseline.  Bicarbonate started. For PCI to diffusely diseased LAD via LIMA graft later today. Discussed with pt and family.   Lennette Bihari, MD, [facility] 07/04/2011 8:22 AM

## 2011-07-04 NOTE — Progress Notes (Signed)
Site area: right groin  Site Prior to Removal:  Level 0  Pressure Applied For 35 MINUTES    Minutes Beginning at 1605  Manual:   yes  Patient Status During Pull:  stable  Post Pull Groin Site:  Level 0  Post Pull Instructions Given:  yes  Post Pull Pulses Present:  yes  Dressing Applied:  yes  Comments:  Held pressure for extra time to maintain hemostasis at site and hematoma developed pressure held and site level 0 will continue to monitor

## 2011-07-04 NOTE — CV Procedure (Signed)
PCI Note:  Mid-distal LAD via LIMA graft.  [name], 51 y.o., male  Full note dictated; see diagram in chart.  DICTATION # [id number], [id number]  Successful PCI/Stent of mid-distal LAD via LIMA graft with diffuse stenosis of 90%, 80%, and 70% to 0 with 2.25x26 mm Resolute DES stent post dilated to 2.43 mm  And PTCA of apical 95% stenosis to 50 - 60% very small distal vessel not amenable to stenting.  Tolerated well.  Hydrate vigorously with significant renal insufficiency.  Lennette Bihari, MD, [facility] 07/04/2011 11:55 AM

## 2011-07-04 NOTE — Progress Notes (Signed)
PHARMACY - MEDICATION RELATED CONSULT Initial Consult Note  Pharmacy Consult for: Sodium bicarbonate infusion  Indication: CIN Prevention   Patient Data:   Allergies: Allergies  Allergen Reactions  . Shrimp (Shellfish Allergy) Shortness Of Breath  . Atorvastatin Other (See Comments)    REACTION: weakness  . Insulins Other (See Comments)    Patient unsure as to the kind of insulin but states that it has previously caused seizures. This occurred in the hospital in 2006; but in most recent hospitalization (Jan 2013) received insulin but did not have reaction  . Simvastatin Other (See Comments)    REACTION: abnormal liver tests    Patient Measurements: Height: 6' (182.9 cm) Weight: 249 lb 1.9 oz (113 kg) IBW/kg (Calculated) : 77.6    Vital Signs: Temp:  [97.6 F (36.4 C)-97.8 F (36.6 C)] 97.6 F (36.4 C) (04/18 1959) Pulse Rate:  [53-62] 53  (04/18 1959) Resp:  [18-22] 18  (04/18 1959) BP: (139-157)/(76-93) 139/76 mmHg (04/18 1959) SpO2:  [97 %-99 %] 99 % (04/18 1959) Weight:  [249 lb 1.9 oz (113 kg)] 249 lb 1.9 oz (113 kg) (04/18 0403)  Intake/Output from previous day:  Intake/Output Summary (Last 24 hours) at 07/04/11 0031 Last data filed at 07/03/11 2229  Gross per 24 hour  Intake 2058.5 ml  Output   5325 ml  Net -3266.5 ml    Labs:  Basename 07/03/11 0630 07/02/11 0550 07/01/11 0425  WBC 7.4 8.4 --  HGB 10.1* 10.1* --  HCT 30.7* 31.1* --  PLT 253 258 --  APTT -- -- --  CREATININE 3.64* 3.81* 3.43*  LABCREA -- -- --  CREATININE 3.64* 3.81* 3.43*  CREAT24HRUR -- -- --  MG -- -- --  PHOS -- -- --  ALBUMIN -- 3.2* --  PROT -- 6.1 --  ALBUMIN -- 3.2* --  AST -- 15 --  ALT -- 19 --  ALKPHOS -- 44 --  BILITOT -- 0.6 --  BILIDIR -- 0.1 --  IBILI -- 0.5 --   Estimated Creatinine Clearance: 31.2 ml/min (by C-G formula based on Cr of 3.64).  Microbiology: No results found for this or any previous visit (from the past 720 hour(s)).  Medical  History: Past Medical History  Diagnosis Date  . ACHALASIA   . ANEMIA-NOS   . CEREBROVASCULAR ACCIDENT, HX OF   . DEGENERATIVE JOINT DISEASE, SHOULDER   . DEPRESSION   . GERD   . Headache   . HYPERLIPIDEMIA   . HYPERTENSION   . RENAL CALCULUS, HX OF   . RENAL INSUFFICIENCY   . Stroke   . Shortness of breath   . Sleep apnea   . H/O hiatal hernia   . Seizures   . CAD (coronary artery disease), 3 vessel significant disease.  03/28/2011  . Impaired glucose tolerance 05/13/2011  . S/P PTCA (percutaneous transluminal coronary angioplasty), 06/30/11 07/01/2011  . NSTEMI (non-ST elevated myocardial infarction), 06/30/11 07/01/2011  . CKD (chronic kidney disease) stage 4, GFR 15-29 ml/min 07/01/2011  . History of diastolic dysfunction, grade 2 by echo 07/03/2011    Scheduled Medications:     . amLODipine  10 mg Oral Daily  . aspirin  324 mg Oral Pre-Cath  . aspirin EC  81 mg Oral Daily  . cloNIDine  0.2 mg Oral TID  . sodium bicarbonate (CIN) infusion   Intravenous Pre-Cath   And  . sodium bicarbonate (CIN) infusion   Intravenous Pre-Cath   And  . sodium bicarbonate (CIN) infusion  Intravenous Pre-Cath  . diazepam  5 mg Oral On Call  . febuxostat  40 mg Oral BID WC  . furosemide  40 mg Oral Once  . hydrALAZINE  25 mg Oral TID  . isosorbide mononitrate  60 mg Oral Daily  . metoprolol  50 mg Oral Q8H  . omega-3 acid ethyl esters  2 g Oral Daily  . polysaccharide iron  150 mg Oral Daily  . rosuvastatin  40 mg Oral q1800  . sodium chloride  3 mL Intravenous Q12H  . terazosin  10 mg Oral QHS  . Ticagrelor  90 mg Oral BID  . DISCONTD: amLODipine  5 mg Oral Daily     Assessment:  51 y.o. male for PCI. Pharmacy consulted to manage sodium bicarbonate infusion as current estimated CrCl 30 ml/min.   Plan:  1. Sodium bicarbonate infusion at 390 ml/hr x 1 hr to start one hour prior to planned cath procedure. 2. Decrease sodium bicarbonate to 130 ml/hr, and plan to continue at this  rate this prior to, during procedure, and for 6 hours following procedure.   Dineen Kid Thad Ranger, PharmD 07/04/2011, 12:31 AM

## 2011-07-05 LAB — HEPARIN LEVEL (UNFRACTIONATED): Heparin Unfractionated: <0.1 IU/mL — ABNORMAL LOW (ref 0.30–0.70)

## 2011-07-05 LAB — CBC
HCT: 29.2 % — ABNORMAL LOW (ref 39.0–52.0)
MCH: 29.4 pg (ref 26.0–34.0)
MCHC: 32.9 g/dL (ref 30.0–36.0)
RDW: 14.3 % (ref 11.5–15.5)

## 2011-07-05 LAB — BASIC METABOLIC PANEL
Calcium: 8.9 mg/dL (ref 8.4–10.5)
Creatinine, Ser: 3.83 mg/dL — ABNORMAL HIGH (ref 0.50–1.35)
GFR calc non Af Amer: 17 mL/min — ABNORMAL LOW (ref >90)
Glucose, Bld: 105 mg/dL — ABNORMAL HIGH (ref 70–99)
Sodium: 138 mEq/L (ref 135–145)

## 2011-07-05 MED ORDER — RANOLAZINE ER 500 MG PO TB12
500.0000 mg | ORAL_TABLET | Freq: Every morning | ORAL | Status: DC
  Administered 2011-07-05 – 2011-07-07 (×3): 500 mg via ORAL
  Filled 2011-07-05 (×3): qty 1

## 2011-07-05 NOTE — Progress Notes (Signed)
[facility] Progress Note  Subjective:  No chest pain  Objective:   Vital Signs in the last 24 hours: Temp:  [97.7 F (36.5 C)-98.8 F (37.1 C)] 98.4 F (36.9 C) (04/20 0650) Pulse Rate:  [46-69] 65  (04/20 0650) Resp:  [8-19] 10  (04/20 0650) BP: (127-156)/(70-97) 154/91 mmHg (04/20 0650) SpO2:  [98 %-100 %] 99 % (04/20 0650) Weight:  [111.9 kg (246 lb 11.1 oz)] 111.9 kg (246 lb 11.1 oz) (04/20 0727)  Intake/Output from previous day: 04/19 0701 - 04/20 0700 In: 458.6 [P.O.:200; I.V.:258.6] Out: 600 [Urine:600]  Scheduled:   . amLODipine  10 mg Oral Daily  . aspirin EC  81 mg Oral Daily  . bivalirudin      . cloNIDine  0.2 mg Oral TID  . diazepam  5 mg Oral On Call  . diphenhydrAMINE      . febuxostat  40 mg Oral BID WC  . fentaNYL      . heparin      . hydrALAZINE  25 mg Oral TID  . isosorbide mononitrate  60 mg Oral Daily  . lidocaine      . metoprolol  50 mg Oral Q8H  . midazolam      . nitroGLYCERIN      . omega-3 acid ethyl esters  2 g Oral Daily  . polysaccharide iron  150 mg Oral Daily  . rosuvastatin  40 mg Oral q1800  . terazosin  10 mg Oral QHS  . Ticagrelor  90 mg Oral BID  . DISCONTD: aspirin  324 mg Oral Pre-Cath  . DISCONTD: aspirin  324 mg Oral Pre-Cath  . DISCONTD: aspirin EC  81 mg Oral Daily  . DISCONTD: sodium bicarbonate (CIN) infusion   Intravenous Pre-Cath  . DISCONTD: sodium bicarbonate (CIN) infusion   Intravenous Pre-Cath  . DISCONTD: sodium bicarbonate (CIN) infusion   Intravenous Pre-Cath  . DISCONTD: sodium chloride  3 mL Intravenous Q12H  . DISCONTD: sodium chloride  3 mL Intravenous Q12H  . DISCONTD: Ticagrelor  90 mg Oral BID    Physical Exam:   General appearance: alert, cooperative and no distress Neck: no JVD Lungs: clear to auscultation bilaterally Heart: S1, S2 normal and 1/6 sem Abdomen: soft, non-tender; bowel sounds normal; no masses,  no organomegaly Groin site stable Extremities: no  edema, redness or tenderness in the calves or thighs   Rate: 80  Rhythm: normal sinus rhythm  Lab Results:    Basename 07/05/11 0414 07/04/11 0640  NA 138 140  K 4.1 3.7  CL 105 107  CO2 24 19  GLUCOSE 105* 123*  BUN 28* 30*  CREATININE 3.83* 3.61*   No results found for this basename: TROPONINI:2,CK,MB:2 in the last 72 hours Hepatic Function Panel No results found for this basename: PROT,ALBUMIN,AST,ALT,ALKPHOS,BILITOT,BILIDIR,IBILI in the last 72 hours  Basename 07/04/11 0640  INR 1.03    Lipid Panel     Component Value Date/Time   CHOL 107 07/01/2011 0425   TRIG 124 07/01/2011 0425   HDL 29* 07/01/2011 0425   CHOLHDL 3.7 07/01/2011 0425   VLDL 25 07/01/2011 0425   LDLCALC 53 07/01/2011 0425     Imaging:  No results found.    Assessment/Plan:   Principal Problem:  *NSTEMI (non-ST elevated myocardial infarction), 06/30/11 Active Problems:  HYPERLIPIDEMIA  HYPERTENSION, accelerated  RENAL INSUFFICIENCY, Cr 3.7  CAD, 3 vessel significant disease at cath this admission. underwent CABG in Jan 2013  S/P CABG x 4:  04/01/11-(LIMA to LAD, SVG to PL branch of RCA, Sequential SVG to OM1/2)  Stable angina: with acute T wave inversion.And unstable angina  S/P PTCA (percutaneous transluminal coronary angioplasty), 06/30/11  CKD (chronic kidney disease) stage 4, GFR 15-29 ml/min  History of diastolic dysfunction, grade 2 by echo  S/P coronary artery stent placement, PTCA/DES Resolute to mid-LAD via the LIMA graft, and PTCA of apical 95% stenosis 07/05/11  Doing well s/p PCI of diffuse LAD stenosis via LIMA graft yesterday and PTCA of OM via SVG.  Continue to hydrate today . Baseline Cr 3.4-3.5 range. Will add low dose ranexa with diffuse CAD. Keep today, probable DC tomorrow if renal fxn stabilizes.    Lennette Bihari, MD, [facility] 07/05/2011, 8:04 AM

## 2011-07-05 NOTE — Progress Notes (Signed)
CARDIAC REHAB PHASE I     1500 - 1525  Pt states he has walked today x2 independent. Exercise guidelines given with understanding.    Rosalie Doctor

## 2011-07-06 LAB — CBC
HCT: 30.4 % — ABNORMAL LOW (ref 39.0–52.0)
MCV: 90.5 fL (ref 78.0–100.0)
Platelets: 248 10*3/uL (ref 150–400)
RBC: 3.36 MIL/uL — ABNORMAL LOW (ref 4.22–5.81)
WBC: 7 10*3/uL (ref 4.0–10.5)

## 2011-07-06 LAB — BASIC METABOLIC PANEL
BUN: 32 mg/dL — ABNORMAL HIGH (ref 6–23)
CO2: 20 mEq/L (ref 19–32)
Chloride: 107 mEq/L (ref 96–112)
Creatinine, Ser: 4.13 mg/dL — ABNORMAL HIGH (ref 0.50–1.35)

## 2011-07-06 MED ORDER — ZOLPIDEM TARTRATE 5 MG PO TABS
10.0000 mg | ORAL_TABLET | Freq: Every evening | ORAL | Status: DC | PRN
  Administered 2011-07-06: 10 mg via ORAL
  Filled 2011-07-06: qty 2

## 2011-07-06 MED ORDER — HYDRALAZINE HCL 25 MG PO TABS
37.5000 mg | ORAL_TABLET | Freq: Three times a day (TID) | ORAL | Status: DC
  Administered 2011-07-06 (×2): 37.5 mg via ORAL
  Administered 2011-07-06: 12.5 mg via ORAL
  Administered 2011-07-06: 25 mg via ORAL
  Administered 2011-07-07 (×2): 37.5 mg via ORAL
  Filled 2011-07-06 (×6): qty 1.5

## 2011-07-06 NOTE — Progress Notes (Signed)
[facility]  Subjective: No complaints  Objective: Vital signs in last 24 hours: Temp:  [97.8 F (36.6 C)-98.9 F (37.2 C)] 98.9 F (37.2 C) (04/21 0500) Pulse Rate:  [60-68] 66  (04/21 0500) Resp:  [15-18] 18  (04/21 0500) BP: (144-168)/(82-90) 168/86 mmHg (04/21 0500) SpO2:  [98 %-100 %] 100 % (04/21 0500) Last BM Date: 07/02/11  Intake/Output from previous day: 04/20 0701 - 04/21 0700 In: 480 [P.O.:480] Out: 1000 [Urine:1000] Intake/Output this shift:    Medications Current Facility-Administered Medications  Medication Dose Route Frequency Provider Last Rate Last Dose  . 0.9 %  sodium chloride infusion   Intravenous Continuous Lennette Bihari, MD 150 mL/hr at 07/05/11 1447    . acetaminophen (TYLENOL) tablet 650 mg  650 mg Oral Q4H PRN Lennette Bihari, MD      . amLODipine (NORVASC) tablet 10 mg  10 mg Oral Daily Marykay Lex, MD   10 mg at 07/05/11 0851  . aspirin EC tablet 81 mg  81 mg Oral Daily Lennette Bihari, MD   81 mg at 07/05/11 0851  . cloNIDine (CATAPRES) tablet 0.2 mg  0.2 mg Oral TID Chrystie Nose, MD   0.2 mg at 07/05/11 2145  . febuxostat (ULORIC) tablet 40 mg  40 mg Oral BID WC Chrystie Nose, MD   40 mg at 07/05/11 1623  . hydrALAZINE (APRESOLINE) tablet 25 mg  25 mg Oral TID Lennette Bihari, MD   25 mg at 07/05/11 2145  . isosorbide mononitrate (IMDUR) 24 hr tablet 60 mg  60 mg Oral Daily Lennette Bihari, MD   60 mg at 07/05/11 0849  . metoprolol (LOPRESSOR) tablet 50 mg  50 mg Oral Q8H Nada Boozer, NP   50 mg at 07/05/11 2145  . morphine 2 MG/ML injection 2 mg  2 mg Intravenous Q4H PRN Dwana Melena, PA   2 mg at 07/04/11 1800  . nitroGLYCERIN 0.2 mg/mL in dextrose 5 % infusion  2-200 mcg/min Intravenous Continuous Lennette Bihari, MD 3 mL/hr at 07/05/11 0300 10 mcg/min at 07/05/11 0300  . omega-3 acid ethyl esters (LOVAZA) capsule 2 g  2 g Oral Daily Chrystie Nose, MD   2 g at 07/05/11 9604  . ondansetron (ZOFRAN)  injection 4 mg  4 mg Intravenous Q6H PRN Lennette Bihari, MD      . polysaccharide iron (NIFEREX) capsule 150 mg  150 mg Oral Daily Chrystie Nose, MD   150 mg at 07/05/11 0853  . ranolazine (RANEXA) 12 hr tablet 500 mg  500 mg Oral q morning - 10a Lennette Bihari, MD   500 mg at 07/05/11 1013  . rosuvastatin (CRESTOR) tablet 40 mg  40 mg Oral q1800 Mihai Croitoru, MD   40 mg at 07/05/11 1802  . sodium chloride (OCEAN) 0.65 % nasal spray 1 spray  1 spray Each Nare PRN Thurmon Fair, MD   1 spray at 07/01/11 0431  . terazosin (HYTRIN) capsule 10 mg  10 mg Oral QHS Chrystie Nose, MD   10 mg at 07/05/11 2144  . Ticagrelor (BRILINTA) tablet 90 mg  90 mg Oral BID Lennette Bihari, MD   90 mg at 07/05/11 2144   Facility-Administered Medications Ordered in Other Encounters  Medication Dose Route Frequency Provider Last Rate Last Dose  . NON FORMULARY 20 mg  20 mg Oral QHS Dwana Melena, PA  PE: General appearance: alert, cooperative and no distress Lungs: clear to auscultation bilaterally Heart: regular rate and rhythm, 2/6 systolic MM at LSB Extremities: No LEE Pulses: 2+ and symmetric  Lab Results:   Basename 07/06/11 0556 07/05/11 0414 07/04/11 0640  WBC 7.0 8.4 7.1  HGB 10.0* 9.6* 11.5*  HCT 30.4* 29.2* 34.6*  PLT 248 268 302   BMET  Basename 07/06/11 0556 07/05/11 0414 07/04/11 0640  NA 139 138 140  K 4.4 4.1 3.7  CL 107 105 107  CO2 20 24 19   GLUCOSE 113* 105* 123*  BUN 32* 28* 30*  CREATININE 4.13* 3.83* 3.61*  CALCIUM 9.1 8.9 9.7   PT/INR  Basename 07/04/11 0640  LABPROT 13.7  INR 1.03   Assessment/Plan  Principal Problem:  *NSTEMI (non-ST elevated myocardial infarction), 06/30/11 Active Problems:  S/P CABG x 4: 04/01/11-(LIMA to LAD, SVG to PL branch of RCA, Sequential SVG to OM1/2)  HYPERTENSION, accelerated  HYPERLIPIDEMIA  RENAL INSUFFICIENCY, Cr 3.7  CAD, 3 vessel significant disease at cath this admission. underwent CABG in Jan 2013  Stable angina:  with acute T wave inversion.And unstable angina  S/P PTCA (percutaneous transluminal coronary angioplasty), 06/30/11  CKD (chronic kidney disease) stage 4, GFR 15-29 ml/min  History of diastolic dysfunction, grade 2 by echo  S/P coronary artery stent placement, PTCA/DES Resolute to mid-LAD via the LIMA graft, and PTCA of apical 95% stenosis 07/05/11  Plan: S/P coronary artery stent placement, PTCA/DES Resolute to mid-LAD via the LIMA graft, and PTCA of apical 95% stenosis 07/05/11.  SCr continues to increase.  3.83 to 4.13.  Not sure what baseline SCr is but he came in at 3.81.  Pt states it is around 3.4.  Continue to hydrate at 11ml/hr. Urine output is better than what is charted.  The patient states he put out about 4L on yesterday.   Recheck BMET in AM. OK to ambulate in the hall.  BP 144/82 - 170/95.  Amlodipine/clonidine/hydralazine/lopressor/Imdur/ ASA.  ? Renal artery stenosis.    LOS: 6 days    HAGER,BRYAN W 07/06/2011 8:34 AM   Patient seen and examined. Agree with assessment and plan. Feels better.  No chest pain. Making good urine, but Cr increased slightly more today to 4.13. Will keep in hospital today; continue to hydrate. Will increase hydralazine to 37.5 mg tid. Hopefully home tomorrow if Creatinine begins to decrease.   Lennette Bihari, MD, [facility] 07/06/2011 9:02 AM

## 2011-07-07 ENCOUNTER — Encounter (HOSPITAL_COMMUNITY): Payer: Medicare Other

## 2011-07-07 LAB — CBC
HCT: 28.8 % — ABNORMAL LOW (ref 39.0–52.0)
MCV: 90 fL (ref 78.0–100.0)
RDW: 13.9 % (ref 11.5–15.5)
WBC: 7.1 10*3/uL (ref 4.0–10.5)

## 2011-07-07 LAB — BASIC METABOLIC PANEL
BUN: 35 mg/dL — ABNORMAL HIGH (ref 6–23)
Chloride: 105 mEq/L (ref 96–112)
Creatinine, Ser: 4.21 mg/dL — ABNORMAL HIGH (ref 0.50–1.35)
GFR calc Af Amer: 17 mL/min — ABNORMAL LOW (ref >90)
Glucose, Bld: 116 mg/dL — ABNORMAL HIGH (ref 70–99)

## 2011-07-07 MED ORDER — ISOSORBIDE MONONITRATE ER 60 MG PO TB24: 60.0000 mg | ORAL_TABLET | Freq: Every day | ORAL | Status: DC

## 2011-07-07 MED ORDER — ROSUVASTATIN CALCIUM 40 MG PO TABS: 20.0000 mg | ORAL_TABLET | Freq: Every day | ORAL | Status: DC

## 2011-07-07 MED ORDER — ROSUVASTATIN CALCIUM 40 MG PO TABS: 40.0000 mg | ORAL_TABLET | Freq: Every day | ORAL | Status: DC

## 2011-07-07 MED ORDER — RANOLAZINE ER 500 MG PO TB12: 500.0000 mg | ORAL_TABLET | Freq: Every morning | ORAL | Status: DC

## 2011-07-07 MED ORDER — AMLODIPINE BESYLATE 10 MG PO TABS: 10.0000 mg | ORAL_TABLET | Freq: Every day | ORAL | Status: DC

## 2011-07-07 MED ORDER — METOPROLOL TARTRATE 50 MG PO TABS: 50.0000 mg | ORAL_TABLET | Freq: Three times a day (TID) | ORAL | Status: DC

## 2011-07-07 MED ORDER — TICAGRELOR 90 MG PO TABS: 90.0000 mg | ORAL_TABLET | Freq: Two times a day (BID) | ORAL | Status: DC

## 2011-07-07 MED ORDER — HYDRALAZINE HCL 25 MG PO TABS: 37.5000 mg | ORAL_TABLET | Freq: Three times a day (TID) | ORAL | Status: DC

## 2011-07-07 MED ORDER — ROSUVASTATIN CALCIUM 20 MG PO TABS: 20.0000 mg | ORAL_TABLET | Freq: Every day | ORAL | Status: DC

## 2011-07-07 MED ORDER — ASPIRIN 81 MG PO TBEC: 81.0000 mg | DELAYED_RELEASE_TABLET | Freq: Every day | ORAL | Status: DC

## 2011-07-07 MED FILL — Dextrose Inj 5%: INTRAVENOUS | Qty: 50 | Status: AC

## 2011-07-07 NOTE — Discharge Instructions (Signed)
Cardiac Diet This diet can help prevent heart disease and stroke. Many factors influence your heart health, including eating and exercise habits. Coronary risk rises a lot with abnormal blood fat (lipid) levels. Cardiac meal planning includes limiting unhealthy fats, increasing healthy fats, and making other small dietary changes. General guidelines are as follows:  Adjust calorie intake to reach and maintain desirable body weight.   Limit total fat intake to less than 30% of total calories. Saturated fat should be less than 7% of calories.   Saturated fats are found in animal products and in some vegetable products. Saturated vegetable fats are found in coconut oil, cocoa butter, palm oil, and palm kernel oil. Read labels carefully to avoid these products as much as possible. Use butter in moderation. Choose tub margarines and oils that have 2 grams of fat or less. Good cooking oils are canola and olive oils.   Practice low-fat cooking techniques. Do not fry food. Instead, broil, bake, boil, steam, grill, roast on a rack, stir-fry, or microwave it. Other fat reducing suggestions include:   Remove the skin from poultry.   Remove all visible fat from meats.   Skim the fat off stews, soups, and gravies before serving them.   Steam vegetables in water or broth instead of sauting them in fat.   Avoid foods with trans fat (or hydrogenated oils), such as commercially fried foods and commercially baked goods. Commercial shortening and deep-frying fats will contain trans fat.   Increase intake of fruits, vegetables, whole grains, and legumes to replace foods high in fat.   Increase consumption of nuts, legumes, and seeds to at least 4 servings weekly. One serving of a legume equals  cup, and 1 serving of nuts or seeds equals  cup.   Choose whole grains more often. Have 3 servings per day (a serving is 1 ounce [oz]).   Have at least 4 cups of fruit and vegetable a day.   Increase your intake  of soluble fiber to 10 to 25 grams per day. Soluble fiber binds cholesterol to be removed from the blood. Foods high in soluble fiber are dried beans, citrus fruits, oats, apples, bananas, broccoli, Brussels sprouts, and eggplant.   Try to include foods fortified with plant sterols or stanols, such as yogurt, breads, juices, or margarines. Choose several fortified foods to achieve a daily intake of 2 to 3 grams of plant sterols or stanols.   Foods with omega-3 fats can help reduce your risk of heart disease. Aim to have a 3.5 oz portion of fatty fish twice per week, such as salmon, mackerel, albacore tuna, sardines, lake trout, or herring. If you wish to take a fish oil supplement, choose one that contains 1 gram of both DHA and EPA.   Limit processed meats to 2 servings (3 oz portion) weekly.   Limit the sodium in your diet to 1500 milligrams (mg) per day. If you have high blood pressure, talk to a registered dietitian about a DASH (Dietary Approaches to Stop Hypertension) eating plan.   Limit beverages with added sugar, such as soda, to no more than 36 ounces per week.  CHOOSING FOODS Starches  Allowed: Breads: All kinds (wheat, rye, raisin, white, oatmeal, [place], [place], and English muffin bread). Low-fat rolls: English muffins, frankfurter and hamburger buns, bagels, pita bread, tortillas (not fried). Pancakes, waffles, biscuits, and muffins made with recommended oil.   Avoid: Products made with saturated or trans fats, oils, or whole milk products. Butter rolls, cheese breads,  croissants. Commercial doughnuts, muffins, sweet rolls, biscuits, waffles, pancakes, store-bought mixes.  Crackers  Allowed: Low-fat crackers and snacks: Animal, graham, rye, saltine (with recommended oil, no lard), oyster, and matzo crackers. Bread sticks, melba toast, rusks, flatbread, pretzels, and light popcorn.   Avoid: High-fat crackers: cheese crackers, butter crackers, and those made with coconut, palm oil,  or trans fat (hydrogenated oils). Buttered popcorn.  Cereals  Allowed: Hot or cold whole-grain cereals.   Avoid: Cereals containing coconut, hydrogenated vegetable fat, or animal fat.  Potatoes / Pasta / Rice  Allowed: All kinds of potatoes, rice, and pasta (such as macaroni, spaghetti, and noodles).   Avoid: Pasta or rice prepared with cream sauce or high-fat cheese. Chow mein noodles, [place] fries.  Vegetables  Allowed: All vegetables and vegetable juices.   Avoid: Fried vegetables. Vegetables in cream, butter, or high-fat cheese sauces. Limit coconut. Fruit in cream or custard.  Meat and Meat Substitutes  Allowed: Limit your intake of meat, seafood, and poultry to no more than 6 oz (cooked weight) per day. All lean, well-trimmed beef, veal, pork, and lamb. All chicken and [place] without skin. All fish and shellfish. Wild game: wild duck, rabbit, pheasant, and venison. Meatless dishes: recipes with dried beans, peas, lentils, and tofu (soybean curd). Seeds and nuts: all seeds and most nuts.   Avoid: Prime grade and other heavily marbled and fatty meats, such as short ribs, spare ribs, rib eye roast or steak, frankfurters, sausage, bacon, and high-fat luncheon meats, mutton. Caviar. Commercially fried fish. Domestic duck, goose, venison sausage. Organ meats: liver, gizzard, heart, chitterlings, brains, kidney, sweetbreads.  Dairy  Allowed: Egg whites or low-cholesterol egg substitutes may be used as desired. Low-fat cheeses: nonfat or low-fat cottage cheese (1% or 2% fat), cheeses made with part skim milk, such as mozzarella, farmers, string, or ricotta. (Cheeses should be labeled no more than 2 to 6 grams fat per oz.)   Avoid: Whole milk cheeses, including colby, cheddar, mu[place]ster, Monterey Jack, [place], [place], [place], American, Swiss, and blue. Creamed cottage cheese, cream cheese.  Milk  Allowed: Skim (or 1%) milk: liquid, powdered, or evaporated. Buttermilk made with low-fat milk.  Drinks made with skim or low-fat milk or cocoa. Chocolate milk or cocoa made with skim or low-fat (1%) milk. Nonfat or low-fat yogurt.   Avoid: Whole milk and whole milk products, including buttermilk or yogurt made from whole milk, drinks made from whole milk. Condensed milk, evaporated whole milk, and 2% milk.  Soups and Combination Foods  Allowed: Low-fat low-sodium soups: broth, dehydrated soups, homemade broth, soups with the fat removed, homemade cream soups made with skim or low-fat milk. Low-fat spaghetti, lasagna, chili, and Spanish rice if low-fat ingredients and low-fat cooking techniques are used.   Avoid: Cream soups made with whole milk, cream, or high-fat cheese. All other soups.  Desserts and Sweets  Allowed: Sherbet, fruit ices, gelatins, meringues, and angel food cake. Homemade desserts with recommended fats, oils, and milk products. Jam, jelly, honey, marmalade, sugars, and syrups. Pure sugar candy, such as gum drops, hard candy, jelly beans, marshmallows, mints, and small amounts of dark chocolate.   Avoid: Commercially prepared cakes, pies, cookies, frosting, pudding, or mixes for these products. Desserts containing whole milk products, chocolate, coconut, lard, palm oil, or palm kernel oil. Ice cream or ice cream drinks. Candy that contains chocolate, coconut, butter, hydrogenated fat, or unknown ingredients. Buttered syrups.  Fats and Oils  Allowed: Vegetable oils: safflower, sunflower, corn, soybean, cottonseed, sesame, canola, olive, or  peanut. Non-hydrogenated margarines. Salad dressing or mayonnaise: homemade or commercial, made with a recommended oil. Low or nonfat salad dressing or mayonnaise.   Limit added fats and oils to 6 to 8 tsp per day (includes fats used in cooking, baking, salads, and spreads on bread). Remember to count the "hidden fats" in foods.   Avoid: Solid fats and shortenings: butter, lard, salt pork, bacon drippings. Gravy containing meat fat,  shortening, or suet. Cocoa butter, coconut. Coconut oil, palm oil, palm kernel oil, or hydrogenated oils: these ingredients are often used in bakery products, nondairy creamers, whipped toppings, candy, and commercially fried foods. Read labels carefully. Salad dressings made of unknown oils, sour cream, or cheese, such as blue cheese and Roquefort. Cream, all kinds: half-and-half, light, heavy, or whipping. Sour cream or cream cheese (even if "light" or low-fat). Nondairy cream substitutes: coffee creamers and sour cream substitutes made with palm, palm kernel, hydrogenated oils, or coconut oil.  Beverages  Allowed: Coffee (regular or decaffeinated), tea. diet carbonated beverages, mineral water. Alcohol: Check with your caregiver. Moderation is recommended.   Avoid: Whole milk, regular sodas, and juice drinks with added sugar.  Condiments  Allowed: All seasonings and condiments. Cocoa powder. "Cream" sauces made with recommended ingredients.   Avoid: Carob powder made with hydrogenated fats.  SAMPLE MENU Breakfast   cup orange juice    cup oatmeal   1 slice toast   1 tsp margarine   1 cup skim milk  Lunch  [place] sandwich with 2 oz [place], 2 slices bread   Lettuce and tomato slices   Fresh fruit   Carrot sticks   Coffee or tea   Snack   Fresh fruit or low-fat crackers  Dinner  3 oz lean ground beef   1 baked potato   1 tsp margarine    cup asparagus   Lettuce salad   1 tbs non-creamy dressing    cup peach slices   1 cup skim milk  Document Released: 12/11/2007 Document Revised: 02/20/2011 Document Reviewed: 03/05/2010 [facility] Patient Information 2012 [place], [place].  Myocardial Infarction A myocardial infarction (MI) is damage to the heart that is not reversible. It is also called a heart attack. An MI usually occurs when a heart (coronary) artery becomes blocked or narrowed. This cuts off the blood supply to the heart. When one or more of the heart  (coronary) arteries becomes blocked, that area of the heart begins to die. This causes pain felt during an MI.  If you think you might be having an MI, call your local emergency services immediately (911 in [place] It is recommended that you take a 162 mg non-enteric coated aspirin if you do not have an aspirin allergy. Do not drive yourself to the hospital or wait to see if your symptoms go away. The sooner MI is treated, the greater the amount of heart muscle saved. Time is muscle. It can save your life. CAUSES  An MI can occur from:  A gradual buildup of a fatty substance called plaque. When plaque builds up in the arteries, this condition is called atherosclerosis. This buildup can block or reduce the blood supply to the heart artery(s).   A sudden plaque rupture within a heart artery that causes a blood clot (thrombus). A blood clot can block the heart artery which does not allow blood flow to the heart.   A severe tightening (spasm) of the heart artery. This is a less common cause of a heart attack. When a heart  artery spasms, it cuts off blood flow through the artery. Spasms can occur in heart arteries that do not have atherosclerosis.  RISK FACTORS People at risk for an MI usually have one or more risk factors, such as:  High blood pressure.   High cholesterol.   Smoking.   Gender. Men have a higher heart attack risk.   Overweight/obesity.   Age.   Family history.   Lack of exercise.   Diabetes.   Stress.   Excessive alcohol use.   Street drug use (cocaine and methamphetamines).  SYMPTOMS  MI symptoms can vary, such as:  In both men and women, MI symptoms can include the following:   Chest pain. The chest pain may feel like a crushing, squeezing, or "pressure" type feeling. MI pain can be "referred," meaning pain can be caused in one part of the body but felt in another part of the body. Referred MI pain may occur in the left arm, neck, or jaw. Pain may even be felt in  the right arm.   Shortness of breath (dyspnea).   Heartburn or indigestion with or without vomiting, shortness of breath, or sweating (diaphoresis).   Sudden, cold sweats.   Sudden lightheadedness.   Upper back pain.   Women can have unique MI symptoms, such as:   Unexplained feelings of nervousness or anxiety.   Discomfort between the shoulder blades (scapula) or upper back.   Tingling in the hands and arms.   In elderly people (regardless of gender), MI symptoms can be subtle, such as:   Sweating (diaphoresis).   Shortness of breath (dyspnea).   General tiredness (fatigue) or not feeling well (malaise).  DIAGNOSIS  Diagnosis of an MI involves several tests such as:  An assessment of your vital signs such as heart rhythm, blood pressure, respiratory rate, and oxygen level.   An EKG (ECG) to look at the electrical activity of your heart.   Blood tests called cardiac markers are drawn at scheduled times to measure proteins or enzymes released by the damaged heart muscle.   A chest X-ray.   An echocardiogram to evaluate heart motion and blood flow.   Coronary angiography (cardiac catheterization). This is a diagnostic procedure to look at the heart arteries.  TREATMENT  Acute Intervention. For an MI, the national standard in the [place] is to have an acute intervention in under 90 minutes from the time you get to the hospital. An acute intervention is a special procedure to open up the heart arteries. It is done in a treatment room called a "catheterization lab" (cath lab). Some hospitals do no have a cath lab. If you are having an MI and the hospital does not have a cath lab, the standard is to transport you to a hospital that has one. In the cath lab, acute intervention includes:  Angioplasty. An angioplasty involves inserting a thin, flexible tube (catheter) into an artery in either your groin or wrist. The catheter is threaded to the heart arteries. A balloon at  the end of the catheter is inflated to open a narrowed or blocked heart artery. During an angioplasty procedure, a small mesh tube (stent) may be used to keep the heart artery open. Depending on your condition and health history, one of two types of stents may be placed:   Drug-eluting stent (DES). A DES is coated with a medicine to prevent scar tissue from growing over the stent. With drug-eluting stents, blood thinning medicine will need to be taken for  up to a year.   Bare metal stent. This type of stent has no special coating to keep tissue from growing over it. This type of stent is used if you cannot take blood thinning medicine for a prolonged time or you need surgery in the near future. After a bare metal stent is placed, blood thinning medicine will need to be taken for about a month.   If you are taking blood thinning medicine (anti-platelet therapy) after stent placement, do not stop taking it unless your caregiver says it is okay to do so. Make sure you understand how long you need to take the medicine.  Surgical Intervention  If an acute intervention is not successful, surgery may be needed:   Open heart surgery (coronary artery bypass graft, CABG). CABG takes a vein (saphenous vein) from your leg. The vein is then attached to the blocked heart artery which bypasses the blockage. This then allows blood flow to the heart muscle.  Additional Interventions  A "clot buster" medicine (thrombolytic) may be given. This medicine can help break up a clot in the heart artery. This medicine may be given if a person cannot get to a cath lab right away.   Intra-aortic balloon pump (IABP). If you have suffered a very severe MI and are too unstable to go to the cath lab or to surgery, an IABP may be used. This is a temporary mechanical device used to increase blood flow to the heart and reduce the workload of the heart until you are stable enough to go to the cath lab or surgery.  HOME CARE  INSTRUCTIONS After an MI, you may need the following:  Medication. Take medication as directed by your caregiver. Medications after an MI may:   Keep your blood from clotting easily (blood thinners).   Control your blood pressure.   Help lower your cholesterol.   Control abnormal heart rhythms.   Lifestyle changes. Under the guidance of your caregiver, lifestyle changes include:   Quitting smoking, if you smoke. Your caregiver can help you quit.   Being physically active.   Maintaining a healthy weight.   Eating a heart healthy diet. A dietician can help you learn healthy eating options.   Managing diabetes.   Reducing stress.   Limiting alcohol intake.  SEEK IMMEDIATE MEDICAL CARE IF:   You have severe chest pain, especially if the pain is crushing or pressure-like and spreads to the arms, back, neck, or jaw. This is an emergency. Do not wait to see if the pain will go away. Get medical help at once. Call your local emergency services (911 in the [place] Do not drive yourself to the hospital.   You have shortness of breath during rest, sleep, or with activity.   You have sudden sweating or clammy skin.   You feel sick to your stomach (nauseous) and throw up (vomit).   You suddenly become lightheaded or dizzy.   You feel your heart beating rapidly or you notice "skipped" beats.  MAKE SURE YOU:   Understand these instructions.   Will watch your condition.   Will get help right away if you are not doing well or get worse.  Document Released: 03/03/2005 Document Revised: 02/20/2011 Document Reviewed: 07/31/2010 [facility] Patient Information 2012 [place], [place].

## 2011-07-07 NOTE — Progress Notes (Signed)
[facility]  Subjective: No CP/SOB . Ambulating without difficulty.    Objective: Vital signs in last 24 hours: Temp:  [97.9 F (36.6 C)-98.6 F (37 C)] 97.9 F (36.6 C) (04/22 0500) Pulse Rate:  [56-65] 63  (04/22 0911) Resp:  [18] 18  (04/22 0500) BP: (132-152)/(72-85) 152/85 mmHg (04/22 0911) SpO2:  [98 %-100 %] 99 % (04/22 0500) Last BM Date: 07/02/11  Intake/Output from previous day: 04/21 0701 - 04/22 0700 In: 2059.2 [P.O.:840; I.V.:1219.2] Out: 3475 [Urine:3475] Intake/Output this shift: Total I/O In: 240 [P.O.:240] Out: 650 [Urine:650]  Medications Current Facility-Administered Medications  Medication Dose Route Frequency Provider Last Rate Last Dose  . 0.9 %  sodium chloride infusion   Intravenous Continuous Lennette Bihari, MD 50 mL/hr at 07/06/11 1115    . acetaminophen (TYLENOL) tablet 650 mg  650 mg Oral Q4H PRN Lennette Bihari, MD   650 mg at 07/07/11 1030  . amLODipine (NORVASC) tablet 10 mg  10 mg Oral Daily Marykay Lex, MD   10 mg at 07/07/11 [phone]  . aspirin EC tablet 81 mg  81 mg Oral Daily Lennette Bihari, MD   81 mg at 07/07/11 0914  . cloNIDine (CATAPRES) tablet 0.2 mg  0.2 mg Oral TID Chrystie Nose, MD   0.2 mg at 07/07/11 0914  . febuxostat (ULORIC) tablet 40 mg  40 mg Oral BID WC Chrystie Nose, MD   40 mg at 07/07/11 0914  . hydrALAZINE (APRESOLINE) tablet 37.5 mg  37.5 mg Oral TID Lennette Bihari, MD   37.5 mg at 07/07/11 0915  . isosorbide mononitrate (IMDUR) 24 hr tablet 60 mg  60 mg Oral Daily Lennette Bihari, MD   60 mg at 07/07/11 0914  . metoprolol (LOPRESSOR) tablet 50 mg  50 mg Oral Q8H Nada Boozer, NP   50 mg at 07/07/11 0630  . morphine 2 MG/ML injection 2 mg  2 mg Intravenous Q4H PRN Dwana Melena, PA   2 mg at 07/04/11 1800  . nitroGLYCERIN 0.2 mg/mL in dextrose 5 % infusion  2-200 mcg/min Intravenous Continuous Lennette Bihari, MD 3 mL/hr at 07/05/11 0300 10 mcg/min at 07/05/11 0300  . omega-3 acid  ethyl esters (LOVAZA) capsule 2 g  2 g Oral Daily Chrystie Nose, MD   2 g at 07/07/11 0914  . ondansetron (ZOFRAN) injection 4 mg  4 mg Intravenous Q6H PRN Lennette Bihari, MD      . polysaccharide iron (NIFEREX) capsule 150 mg  150 mg Oral Daily Chrystie Nose, MD   150 mg at 07/07/11 0914  . ranolazine (RANEXA) 12 hr tablet 500 mg  500 mg Oral q morning - 10a Lennette Bihari, MD   500 mg at 07/07/11 0914  . rosuvastatin (CRESTOR) tablet 40 mg  40 mg Oral q1800 Mihai Croitoru, MD   40 mg at 07/06/11 1715  . sodium chloride (OCEAN) 0.65 % nasal spray 1 spray  1 spray Each Nare PRN Thurmon Fair, MD   1 spray at 07/01/11 0431  . terazosin (HYTRIN) capsule 10 mg  10 mg Oral QHS Chrystie Nose, MD   10 mg at 07/06/11 2127  . Ticagrelor (BRILINTA) tablet 90 mg  90 mg Oral BID Lennette Bihari, MD   90 mg at 07/07/11 0914  . zolpidem (AMBIEN) tablet 10 mg  10 mg Oral QHS PRN Abelino Derrick, PA   10 mg at 07/06/11 2127  Facility-Administered Medications Ordered in Other Encounters  Medication Dose Route Frequency Provider Last Rate Last Dose  . NON FORMULARY 20 mg  20 mg Oral QHS Dwana Melena, PA        PE: General appearance: alert, cooperative and no distress Lungs: clear to auscultation bilaterally Heart: regular rate and rhythm, S1, S2 normal, no murmur, click, rub or gallop Extremities: No LEE Pulses: 2+ and symmetric,  Right DP 1+  Lab Results:   Basename 07/07/11 0530 07/06/11 0556 07/05/11 0414  WBC 7.1 7.0 8.4  HGB 9.8* 10.0* 9.6*  HCT 28.8* 30.4* 29.2*  PLT 260 248 268   BMET  Basename 07/07/11 0530 07/06/11 0556 07/05/11 0414  NA 135 139 138  K 4.3 4.4 4.1  CL 105 107 105  CO2 20 20 24   GLUCOSE 116* 113* 105*  BUN 35* 32* 28*  CREATININE 4.21* 4.13* 3.83*  CALCIUM 9.1 9.1 8.9   I.V. (mL/kg) 600 (5.4) 868.3 (7.8) 350.8 (3.1)  Total Intake(mL/kg) 1080 (9.7) 1708.3 (15.3) 590.8 (5.3)  Urine (mL/kg/hr) 1000 (0.4) 3475 (1.3) 650  Total Output 1000 3475 650 Net +80  -1766.7 -59.2  Assessment/Plan   Principal Problem:  *NSTEMI (non-ST elevated myocardial infarction), 06/30/11 Active Problems:  S/P CABG x 4: 04/01/11-(LIMA to LAD, SVG to PL branch of RCA, Sequential SVG to OM1/2)  HYPERTENSION, accelerated  HYPERLIPIDEMIA  RENAL INSUFFICIENCY, Cr 3.7  CAD, 3 vessel significant disease at cath this admission. underwent CABG in Jan 2013  Stable angina: with acute T wave inversion.And unstable angina  S/P PTCA (percutaneous transluminal coronary angioplasty), 06/30/11  CKD (chronic kidney disease) stage 4, GFR 15-29 ml/min  History of diastolic dysfunction, grade 2 by echo  S/P coronary artery stent placement, PTCA/DES Resolute to mid-LAD via the LIMA graft, and PTCA of apical 95% stenosis 07/05/11  Plan:  S/P coronary artery stent placement, PTCA/DES Resolute to mid-LAD via the LIMA graft, and PTCA of apical 95% stenosis 07/05/11.  SCr continues to go up slightly  3.83>>4.13>>4.21.  Pt states it is usually around 3.4. Urine output is very good.  ~ 3.5L yesterday.  Recommend outpatient BMET on Wednesday and followup with Dr Hyman Hopes.   LOS: 7 days    [name],[name] 07/07/2011 10:59 AM   Agree with note written by [facility]  Looks great. No C P/SOB. Scr slightly increased. Exam benign. OK for D/C home today. ROV with me 1-2 weeks.  Runell Gess 07/07/2011 4:24 PM

## 2011-07-07 NOTE — Progress Notes (Signed)
CARDIAC REHAB PHASE I   PRE:  Rate/Rhythm: 59 SB    BP: sitting 132/90    SaO2: 97 RA  MODE:  Ambulation: 700 ft   POST:  Rate/Rhythm: 74 SR    BP: sitting 144/84     SaO2:   Tolerated well independently. No c/o. Encouraged more walking, sitting up. Reviewed NTG usage.  [id number]  Harriet Masson CES, ACSM

## 2011-07-09 ENCOUNTER — Encounter (HOSPITAL_COMMUNITY): Payer: Medicare Other

## 2011-07-10 DIAGNOSIS — Z79899 Other long term (current) drug therapy: Secondary | ICD-10-CM | POA: Diagnosis not present

## 2011-07-10 NOTE — Discharge Summary (Signed)
Physician Discharge Summary  Patient ID: [name] MRN: [id number] DOB/AGE: 16-Nov-1960 51 y.o.  Admit date: 06/30/2011 Discharge date: 07/10/2011  Admission Diagnoses: Unstable angina/ NSTEMI  Discharge Diagnoses:  Principal Problem:  *NSTEMI (non-ST elevated myocardial infarction), 06/30/11 Active Problems:  S/P CABG x 4: 04/01/11-(LIMA to LAD, SVG to PL branch of RCA, Sequential SVG to OM1/2)  HYPERTENSION, accelerated  HYPERLIPIDEMIA  RENAL INSUFFICIENCY, Cr 3.7  CAD, 3 vessel significant disease at cath this admission. underwent CABG in Jan 2013  Stable angina: with acute T wave inversion.And unstable angina  S/P PTCA (percutaneous transluminal coronary angioplasty), 06/30/11  CKD (chronic kidney disease) stage 4, GFR 15-29 ml/min  History of diastolic dysfunction, grade 2 by echo  S/P coronary artery stent placement, PTCA/DES Resolute to mid-LAD via the LIMA graft, and PTCA of apical 95% stenosis 07/05/11   Discharged Condition: stable  Hospital Course:   The patient is a very pleasant 51 year old obese African American male with a history of a recent coronary artery bypass grafting times four in January of this year. He has a LIMA to the LAD, SVG to the PL branch of the RCA a sequential SVG to the OM1 and 2. 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 He  was last seen by Dr. Allyson Sabal on 04/28/2011 at which time, he been doing quite well without any CP. He had a Lexiscan stress test last Wednesday which was considered to be low risk for ischemia. On Thursday of last week patient developed stable angina during exercise. He was walking outside at home and developed severe chest pressure. He stopped and laid down on the grass and the chest pain resolved. On Saturday the patient had episode of unstable angina with chest pain while resting on the couch. Both times his blood  pressures been elevated over 180 systolic.  While at cardiac rehabilitation the patient was exercising and developed 7/10 chest pain with associated shortness of breath but without radiation or diaphoresis. EKG again at that time showed significant T wave inversion considerably greater than the prior EKG in February. Patient denied any nausea, vomiting, dizziness, lower extremity edema, abdominal pain.  The patient was taken to the emergency room then to the cath lab(See details below).  The patient was started on IV heparin and IV nitroglycerin, the later of which was titrated to blood pressure control and chest pain.Cath revealed remarkably severe native coronary artery and  vein graft disease in the distribution of all 3 coronary arteries. A clear-cut culprit was not obvious but the distal limb of the saphenous vein graft to the OM1/OM 2 appeared to be the most likely candidate.  Successful PTCA was completed via the SVG of OM@ at anastamosis site ( very small vessel) with 2.0 x 12 Emerge balloon dilated up to 2.03 mm with inflations up to 3 minutes: 99% to 20 - 30%.  Lopressor was increased to 50 mg every 8 hours.  Patient was hydrated vigorously after the first PCI procedure in 07/02/2011 patient's serum creatinine was 3.8. Been decided that the patient needed a back to the Cath Lab for additional PCI to the mid-distal LAD.  This was completed on 07/03/2011: successful PCI/Stent of mid-distal LAD via LIMA graft with diffuse stenosis of 90%, 80%, and 70% to 0 with 2.25x26 mm Resolute DES stent post dilated to 2.43 mm And PTCA of apical 95% stenosis to 50 - 60% very small distal vessel not amenable  to stenting.  Patient was started on low-dose Ranexa. We continued to hydrate him.  He has been pain free since the initial PCI.  Hydralazine was increased to 37.5 mg 3 times daily.  Patient has been ambulating without difficulty chest pain or shortness of breath. Serum creatinine at discharge is 4.21 and his baseline  is typically around 3.5-3.8.  Patient has been seen by Dr. Allyson Sabal is a stable for discharge home we'll do follow-up outpatient basic metabolic panel to check his creatinine.  Case management supplied the patient with Brilinta prescription support information.  Consults: None  Significant Diagnostic Studies: 06/30/11, Cardiac PCI  PCI Note: PTCA of OM2 via SVG  [name], 51 y.o., male  Full note dictated; See diagram  DICTATION # [id number], [id number]  Successful PTCA via SVG of OM@ at anastamosis site ( very small vessel) with 2.0 x 12 Emerge balloon dilated up to 2.03 mm with inflations up to 3 minutes: 99% to 20 - 30%.  Brillinta 180 mg, Angiomax, IC and IV NTG (titrated up to 50 micrograms).  Hydrate aggressively as tolerates with Cr 3.4.  Lennette Bihari, MD, [facility]  06/30/2011  4:12 PM ________________________________________________________________________________________  CARDIAC CATHETERIZATION REPORT  Procedures performed:  1. Left heart catheterization  2. Selective coronary angiography  3. Selective angiography of saphenous vein graft bypasses to the OM1/OM 2 and to the PLA.  4. Selective angiography of left internal mammary bypass graft to the LAD  5. Left ventricle pressure recording  Reason for procedure:  Acute non- ST segment elevation myocardial infarction  Procedure performed by: Thurmon Fair, MD, [facility]  Complications: none  Estimated blood loss: less than 5 mL  History: This is a 51 year old man with premature coronary artery disease who underwent four-vessel bypass surgery just 3 months ago (LIMA to the LAD, SVG to PLA branch of the RCA, sequential SVG to OM1 and on 2). He also has stage IV chronic kidney disease and is on the renal transplant waiting list ( current creatinine is 3.4 mg/dL, close to his baseline). A very recent nuclear stress test showed a small area of inferolateral ischemia. At that time he did not have prominent anginal symptoms. Over the last few  days has developed angina with light exertion and he developed severe chest pain while walking a treadmill at cardiac rehabilitation earlier today. His electrocardiogram shows extensive ST-T wave changes in the anterior and lateral leads. His cardiac enzymes show a slight elevation in cardiac troponin I.  Consent: The risks, benefits, and details of the procedure were explained to the patient. Risks including death, MI, stroke, bleeding, limb ischemia, renal failure and allergy were described and accepted by the patient. Informed written consent was obtained prior to proceeding.  Technique: The patient was brought to the cardiac catheterization laboratory in the fasting state. He was prepped and draped in the usual sterile fashion. Local anesthesia with 1% lidocaine was administered to the right groin area. Using the modified Seldinger technique a 5 French right common femoral artery sheath was introduced without difficulty. Under fluoroscopic guidance, using 5 [place] JL4, JR and angled pigtail catheters, selective cannulation of the left coronary artery, right coronary artery, LIMA to LAD bypass and SVG to PLA and OM1/OM 2 and left ventricle were respectively performed. Several coronary angiograms in a variety of projections were recorded, but a left ventriculogram is not performed in order to reduce contrast exposure. Left ventricular pressure and a pull back to the aorta were recorded. No immediate complications occurred. The  diagnostic procedure was mutely followed by percutaneous for breast physician performer Dr. Daphene Jaeger M.D.  Contrast used: 70 mL Omnipaque  Angiographic Findings:  1. The left main coronary artery is free of significant atherosclerosis and bifurcates in the usual fashion into the left anterior descending artery and left circumflex coronary artery.  2. The left anterior descending artery is severely and diffusely diseased throughout its entire proximal third. He generates a very large  first septal artery there appears healthy. Thereafter the vessel is severely stenosed. He generates a smaller second septal artery and a diffusely diseased first diagonal artery. Beyond this the native LAD arteries totally occluded which is a new development since his previous cardiac catheterization  3. The left circumflex coronary artery is a very large-size vessel possibly co-dominant vessel that generates 3 major oblique marginal arteries. There is evidence of extensive luminal irregularities and mild calcification. Scattered 50-70% stenoses are seen through the entire AV groove portion of the left circumflex coronary artery. There is no evidence of competitive flow in the oblique marginal arteries from the saphenous vein graft bypass.  4. The right coronary artery is a small size-size co dominant vessel that generates only the right posterior descending artery There is evidence of expense luminal irregularities and mild calcification. no hemodynamically meaningful stenoses are seen in the RCA proper but the proximal portion of the PDA severely diseased with about 80% to 90% stenosis/  5. The left ventricle was not injected. The left ventricular end-diastolic pressure is 9 mm Hg. There is notice of aortic stenosis by pullback.  6. The left internal mammary artery bypass to the LAD is a widely patent and healthy conduit. It connects to the mid to distal LAD and provides mostly antegrade flow as well as flow to a small retrograde portion that is then totally occluded. The LAD artery severely diseased with at least to 80% smooth stenosis he seen upstream and downstream of the anastomotic site.  .7. The saphenous vein graft bypass to the PDA is a small and diseased conduit. Significant irregularities and stenosis are seen prominently in the proximal third of the vessel. The maximum diameter stenosis may be as bad as 70%. However there is TIMI-3 flow in the vein graft is relatively well matched in caliber with  the distal PDA.  8. Sequential saphenous vein graft to the OM1 and OM2 is generally a healthy conduit. The anastomosis with the first oblique marginal is healthy but feeds a diffusely diseased branching OM 1. The distal limb of the sequential saphenous vein graft is also healthy but there is an extremely severe stenosis at the anastomotic site. This is probably greater than 90% in severity. Diet "hangs up" just before the anastomosis, suggesting that this may be a culprit vessel   IMPRESSIONS:  [name] has remarkably severe native coronary artery and a vein graft disease in the distribution of all 3 coronary arteries. A clear-cut culprit is not obvious but the distal limb of the saphenous vein graft to the OM1/OM 2 appears the most likely candidate. This coincides with I nerve ischemia seen on nuclear scintigraphy. It does not fit perfectly with the EKG changes which be more in keeping with LAD artery disease.  The LAD vessel itself cannot be approached via the native left main coronary artery. The LIMA bypass is a very tortuous vessel which should make intervention on the LAD artery very difficult.  RECOMMENDATION:  Attempt PCI to the distal limb of the saphenous vein graft to the OM1/OM  2   Treatments: see cath reports  Discharge Exam: Blood pressure 134/79, pulse 56, temperature 97.9 F (36.6 C), temperature source Oral, resp. rate 18, height 6' (1.829 m), weight 111.9 kg (246 lb 11.1 oz), SpO2 97.00%.   Disposition: 01-Home or Self Care  Discharge Orders    Future Appointments: Provider: Department: Dept Phone: Center:   11/11/2011 8:00 AM Corwin Levins, MD [facility] [phone] [facility]     Future Orders Please Complete By Expires   Amb Referral to Cardiac Rehabilitation        Medication List  As of 07/10/2011  2:26 PM   STOP taking these medications         aspirin 325 MG tablet      furosemide 40 MG tablet         TAKE these medications         acetaminophen 325 MG tablet    Commonly known as: TYLENOL   Take 650 mg by mouth every 6 (six) hours as needed. For pain      amLODipine 10 MG tablet   Commonly known as: NORVASC   Take 1 tablet (10 mg total) by mouth daily.      aspirin 81 MG EC tablet   Take 1 tablet (81 mg total) by mouth daily.      cloNIDine 0.2 MG tablet   Commonly known as: CATAPRES   Take 0.2 mg by mouth 3 (three) times daily.      febuxostat 40 MG tablet   Commonly known as: ULORIC   Take 40 mg by mouth 2 (two) times daily.      Fish Oil 1200 MG Caps   Take 1 capsule by mouth 3 (three) times daily.      hydrALAZINE 25 MG tablet   Commonly known as: APRESOLINE   Take 1.5 tablets (37.5 mg total) by mouth 3 (three) times daily.      isosorbide mononitrate 60 MG 24 hr tablet   Commonly known as: IMDUR   Take 1 tablet (60 mg total) by mouth daily.      metoprolol 50 MG tablet   Commonly known as: LOPRESSOR   Take 1 tablet (50 mg total) by mouth every 8 (eight) hours.      polysaccharide iron 150 MG Caps capsule   Commonly known as: NIFEREX   Take 150 mg by mouth daily.      ranolazine 500 MG 12 hr tablet   Commonly known as: RANEXA   Take 1 tablet (500 mg total) by mouth every morning.      rosuvastatin 20 MG tablet   Commonly known as: CRESTOR   Take 1 tablet (20 mg total) by mouth daily at 6 PM.      terazosin 10 MG capsule   Commonly known as: HYTRIN   Take 10 mg by mouth at bedtime.      Ticagrelor 90 MG Tabs tablet   Commonly known as: BRILINTA   Take 1 tablet (90 mg total) by mouth 2 (two) times daily.           Follow-up Information    Follow up with Runell Gess, MD. (our office will call you with date and time)    Contact information:   [address] [place] [address] [phone]          Signed: Dwana Melena 07/10/2011, 2:26 PM

## 2011-07-11 ENCOUNTER — Encounter (HOSPITAL_COMMUNITY): Payer: Medicare Other

## 2011-07-14 ENCOUNTER — Encounter (HOSPITAL_COMMUNITY): Payer: Medicare Other

## 2011-07-16 ENCOUNTER — Encounter (HOSPITAL_COMMUNITY): Payer: Medicare Other

## 2011-07-18 ENCOUNTER — Other Ambulatory Visit: Payer: Self-pay

## 2011-07-18 ENCOUNTER — Encounter (HOSPITAL_COMMUNITY): Payer: Medicare Other

## 2011-07-18 DIAGNOSIS — Z01810 Encounter for preprocedural cardiovascular examination: Secondary | ICD-10-CM

## 2011-07-18 DIAGNOSIS — N184 Chronic kidney disease, stage 4 (severe): Secondary | ICD-10-CM

## 2011-07-21 ENCOUNTER — Encounter (HOSPITAL_COMMUNITY): Payer: Medicare Other

## 2011-07-21 DIAGNOSIS — N390 Urinary tract infection, site not specified: Secondary | ICD-10-CM | POA: Diagnosis not present

## 2011-07-21 DIAGNOSIS — N185 Chronic kidney disease, stage 5: Secondary | ICD-10-CM | POA: Diagnosis not present

## 2011-07-21 DIAGNOSIS — I120 Hypertensive chronic kidney disease with stage 5 chronic kidney disease or end stage renal disease: Secondary | ICD-10-CM | POA: Diagnosis not present

## 2011-07-21 DIAGNOSIS — D649 Anemia, unspecified: Secondary | ICD-10-CM | POA: Diagnosis not present

## 2011-07-21 DIAGNOSIS — I10 Essential (primary) hypertension: Secondary | ICD-10-CM | POA: Diagnosis not present

## 2011-07-23 ENCOUNTER — Encounter (HOSPITAL_COMMUNITY): Payer: Medicare Other

## 2011-07-25 ENCOUNTER — Ambulatory Visit (INDEPENDENT_AMBULATORY_CARE_PROVIDER_SITE_OTHER): Payer: Medicare Other | Admitting: Internal Medicine

## 2011-07-25 ENCOUNTER — Encounter (HOSPITAL_COMMUNITY): Payer: Medicare Other

## 2011-07-25 ENCOUNTER — Encounter: Payer: Self-pay | Admitting: Internal Medicine

## 2011-07-25 VITALS — BP 148/90 | HR 55 | Temp 97.6°F | Ht 72.0 in | Wt 245.8 lb

## 2011-07-25 DIAGNOSIS — M109 Gout, unspecified: Secondary | ICD-10-CM | POA: Diagnosis not present

## 2011-07-25 MED ORDER — METHYLPREDNISOLONE ACETATE 80 MG/ML IJ SUSP
120.0000 mg | Freq: Once | INTRAMUSCULAR | Status: AC
  Administered 2011-07-25: 120 mg via INTRAMUSCULAR

## 2011-07-25 NOTE — Patient Instructions (Signed)
It was good to see you today. You have been given an injection of Depo-Medrol for your gout flare treatment today Continue Uloric daily - samples and copay card given today

## 2011-07-25 NOTE — Progress Notes (Signed)
  Subjective:    Patient ID: [name], male    DOB: February 08, 1961, 51 y.o.   MRN: [id number]  HPI   complains of gout flare > R ankle Hx same - rx'd uloric, colchicine and indomethacin- but takes prn/infrequently due to cost Denies precipitating injury No other joints affected during this flare Onset of symptoms 48 hours ago Has taken 2 colchicine and indomethacin without improvement in symptoms  Past Medical History  Diagnosis Date  . ACHALASIA   . ANEMIA-NOS   . CEREBROVASCULAR ACCIDENT, HX OF   . DEGENERATIVE JOINT DISEASE, SHOULDER   . DEPRESSION   . GERD   . Headache   . HYPERLIPIDEMIA   . HYPERTENSION   . RENAL CALCULUS, HX OF   . RENAL INSUFFICIENCY   . Stroke   . Shortness of breath   . Sleep apnea   . H/O hiatal hernia   . Seizures   . CAD (coronary artery disease), 3 vessel significant disease.  03/28/2011  . Impaired glucose tolerance 05/13/2011  . S/P PTCA (percutaneous transluminal coronary angioplasty), 06/30/11 07/01/2011  . NSTEMI (non-ST elevated myocardial infarction), 06/30/11 07/01/2011  . CKD (chronic kidney disease) stage 4, GFR 15-29 ml/min 07/01/2011  . History of diastolic dysfunction, grade 2 by echo 07/03/2011  . S/P coronary artery stent placement, PTCA/DES Resolute to mid-LAD via the LIMA graft, and PTCA of apical 95% stenosis 07/05/11 07/04/2011     Review of Systems  Constitutional: Negative for fever and chills.  Musculoskeletal: Positive for joint swelling (r ankle). Negative for back pain.       Objective:   Physical Exam  BP 148/90  Pulse 55  Temp(Src) 97.6 F (36.4 C) (Temporal)  Ht 6' (1.829 m)  Wt 245 lb 12.8 oz (111.494 kg)  BMI 33.34 kg/m2  SpO2 97% Gen: NAD - mom at side MSkel - swollen/warm R ankle consistent with gout flare - no tophi or other affected joints in feet/knees/wrists or hands      Assessment & Plan:  Acute gout flare right ankle - IM Medrol for anti-inflammatory today Encouraged continued use Uloric as  rx'd for next 30 days - samples given today and co-pay card Continue when necessary colchicine and or indomethacin as needed

## 2011-07-28 ENCOUNTER — Encounter (HOSPITAL_COMMUNITY): Payer: Medicare Other

## 2011-07-28 DIAGNOSIS — I10 Essential (primary) hypertension: Secondary | ICD-10-CM | POA: Diagnosis not present

## 2011-07-28 DIAGNOSIS — I739 Peripheral vascular disease, unspecified: Secondary | ICD-10-CM | POA: Diagnosis not present

## 2011-07-28 DIAGNOSIS — E782 Mixed hyperlipidemia: Secondary | ICD-10-CM | POA: Diagnosis not present

## 2011-07-30 ENCOUNTER — Encounter (HOSPITAL_COMMUNITY): Payer: Medicare Other

## 2011-07-31 ENCOUNTER — Encounter: Payer: Self-pay | Admitting: Vascular Surgery

## 2011-07-31 ENCOUNTER — Ambulatory Visit (HOSPITAL_COMMUNITY): Payer: Medicare Other

## 2011-08-01 ENCOUNTER — Encounter (INDEPENDENT_AMBULATORY_CARE_PROVIDER_SITE_OTHER): Payer: Medicare Other | Admitting: *Deleted

## 2011-08-01 ENCOUNTER — Ambulatory Visit (INDEPENDENT_AMBULATORY_CARE_PROVIDER_SITE_OTHER): Payer: Medicare Other | Admitting: Vascular Surgery

## 2011-08-01 ENCOUNTER — Encounter: Payer: Self-pay | Admitting: Vascular Surgery

## 2011-08-01 ENCOUNTER — Encounter (HOSPITAL_COMMUNITY): Payer: Medicare Other

## 2011-08-01 VITALS — BP 157/100 | HR 61 | Temp 97.6°F | Ht 72.0 in | Wt 244.0 lb

## 2011-08-01 DIAGNOSIS — N186 End stage renal disease: Secondary | ICD-10-CM | POA: Insufficient documentation

## 2011-08-01 DIAGNOSIS — N184 Chronic kidney disease, stage 4 (severe): Secondary | ICD-10-CM | POA: Diagnosis not present

## 2011-08-01 DIAGNOSIS — Z01810 Encounter for preprocedural cardiovascular examination: Secondary | ICD-10-CM

## 2011-08-01 NOTE — Progress Notes (Signed)
[facility] [place]  Referred by:  Corwin Levins, MD [address] [facility] [address] [place], [place] [address]  Reason for referral: New access  History of Present Illness  [name] is a 51 y.o. ([birth year]) male with chronic kidney disease stage IV who presents for evaluation for permanent access.  The patient is right hand dominant.  The patient has not had previous access procedures.  Previous central venous cannulation procedures include: [facility] (only in place two days).  The patient has never had a PPM placed.  He has not neurologic sx in either arm.  Past Medical History  Diagnosis Date  . ACHALASIA   . ANEMIA-NOS   . CEREBROVASCULAR ACCIDENT, HX OF   . DEGENERATIVE JOINT DISEASE, SHOULDER   . DEPRESSION   . GERD   . Headache   . HYPERLIPIDEMIA   . HYPERTENSION   . RENAL CALCULUS, HX OF   . RENAL INSUFFICIENCY   . Stroke   . Shortness of breath   . Sleep apnea   . H/O hiatal hernia   . Seizures   . CAD (coronary artery disease), 3 vessel significant disease.  03/28/2011  . Impaired glucose tolerance 05/13/2011  . S/P PTCA (percutaneous transluminal coronary angioplasty), 06/30/11 07/01/2011  . NSTEMI (non-ST elevated myocardial infarction), 06/30/11 07/01/2011  . CKD (chronic kidney disease) stage 4, GFR 15-29 ml/min 07/01/2011  . History of diastolic dysfunction, grade 2 by echo 07/03/2011  . S/P coronary artery stent placement, PTCA/DES Resolute to mid-LAD via the LIMA graft, and PTCA of apical 95% stenosis 07/05/11 07/04/2011    Past Surgical History  Procedure Date  . Tonsillectomy   . Stomach surgery 2009    achalasia  . Knee surgery 01/07/2011    right  . Coronary artery bypass graft 04/01/2011    Procedure: CORONARY ARTERY BYPASS GRAFTING (CABG);  Surgeon: Kathlee Nations Suann Larry, MD;  Location: [facility] OR;  Service: Open Heart Surgery;  Laterality: N/A;  . Insertion of dialysis catheter 04/01/2011    Procedure: INSERTION OF DIALYSIS  CATHETER;  Surgeon: Nilda Simmer, MD;  Location: [facility] OR;  Service: Vascular;  Laterality: N/A;    History   Social History  . Marital Status: Married    Spouse Name: N/A    Number of Children: N/A  . Years of Education: N/A   Occupational History  . Not on file.   Social History Main Topics  . Smoking status: Never Smoker   . Smokeless tobacco: Never Used  . Alcohol Use: Yes     rarely  . Drug Use: No  . Sexually Active: Not on file   Other Topics Concern  . Not on file   Social History Narrative  . No narrative on file    Family History  Problem Relation Age of Onset  . Hypertension Other   . Stroke Other   . Hyperlipidemia Other     Current Outpatient Prescriptions on File Prior to Visit  Medication Sig Dispense Refill  . acetaminophen (TYLENOL) 325 MG tablet Take 650 mg by mouth every 6 (six) hours as needed. For pain      . amLODipine (NORVASC) 10 MG tablet Take 1 tablet (10 mg total) by mouth daily.  30 tablet  6  . aspirin EC 81 MG EC tablet Take 1 tablet (81 mg total) by mouth daily.      . cloNIDine (CATAPRES) 0.2 MG tablet Take 0.2 mg by mouth 3 (  three) times daily.      . febuxostat (ULORIC) 40 MG tablet Take 40 mg by mouth 2 (two) times daily.      . hydrALAZINE (APRESOLINE) 25 MG tablet Take 1.5 tablets (37.5 mg total) by mouth 3 (three) times daily.  135 tablet  5  . isosorbide mononitrate (IMDUR) 60 MG 24 hr tablet Take 1 tablet (60 mg total) by mouth daily.  30 tablet  6  . metoprolol (LOPRESSOR) 50 MG tablet Take 1 tablet (50 mg total) by mouth every 8 (eight) hours.  90 tablet  6  . Omega-3 Fatty Acids (FISH OIL) 1200 MG CAPS Take 1 capsule by mouth 3 (three) times daily.      . polysaccharide iron (NIFEREX) 150 MG CAPS capsule Take 150 mg by mouth daily.      . ranolazine (RANEXA) 500 MG 12 hr tablet Take 1 tablet (500 mg total) by mouth every morning.  30 tablet  6  . rosuvastatin (CRESTOR) 20 MG tablet Take 1 tablet (20 mg total) by mouth  daily at 6 PM.  30 tablet  6  . terazosin (HYTRIN) 10 MG capsule Take 10 mg by mouth at bedtime.      . Ticagrelor (BRILINTA) 90 MG TABS tablet Take 1 tablet (90 mg total) by mouth 2 (two) times daily.  60 tablet  11    Allergies  Allergen Reactions  . Shrimp (Shellfish Allergy) Shortness Of Breath  . Atorvastatin Other (See Comments)    REACTION: weakness  . Insulins Other (See Comments)    Patient unsure as to the kind of insulin but states that it has previously caused seizures. This occurred in the hospital in 2006; but in most recent hospitalization (Jan 2013) received insulin but did not have reaction  . Simvastatin Other (See Comments)    REACTION: abnormal liver tests    REVIEW OF SYSTEMS:  (Positives indicated with an "x", otherwise negative)  CARDIOVASCULAR: [x]  chest pain    [ ]  chest pressure    [ ]  palpitations    [ ]  orthopnea   [ ]  dyspnea on exert. [ ]  claudication    [ ]  rest pain     [ ]  DVT     [ ]  phlebitis  PULMONARY:    [ ]  productive cough [ ]  asthma  [ ]  wheezing  NEUROLOGIC:    [ ]  weakness    [ ]  paresthesias   [ ]  aphasia    [ ]  amaurosis    [ ]  dizziness  HEMATOLOGIC:    [ ]  bleeding problems  [ ]  clotting disorders  MUSCULOSKEL: [ ]  joint pain     [ ]  joint swelling  GASTROINTEST:  [ ]   blood in stool   [ ]   hematemesis  GENITOURINARY:   [ ]   dysuria    [ ]   hematuria  PSYCHIATRIC:   [ ]  history of major depression  INTEGUMENTARY: [ ]  rashes    [ ]  ulcers  CONSTITUTIONAL:  [ ]  fever     [ ]  chills  Physical Examination  Filed Vitals:   08/01/11 1343  BP: 157/100  Pulse: 61  Temp: 97.6 F (36.4 C)  TempSrc: Oral  Height: 6' (1.829 m)  Weight: 244 lb (110.678 kg)  SpO2: 100%   Body mass index is 33.09 kg/(m^2).  General: A&O x 3, WDWN  Head: [place]/AT  Ear/Nose/Throat: Hearing grossly intact, nares w/o erythema or drainage, oropharynx w/o Erythema/Exudate  Eyes: PERRLA, EOMI  Neck: Supple, no nuchal rigidity, no palpable  LAD  Pulmonary: Sym exp, good air movt, CTAB, no rales, rhonchi, & wheezing  Cardiac: RRR, Nl S1, S2, no Murmurs, rubs or gallops  Vascular: Vessel Right Left  Radial Palpable Palpable  Brachial Palpable Palpable  Carotid Palpable, without bruit Palpable, without bruit  Aorta Non-palpable N/A  Femoral Palpable Palpable  Popliteal Non-palpable Non-palpable  PT Palpable Palpable  DP Palpable Palpable   Gastrointestinal: soft, NTND, -G/R, - HSM, - masses, - CVAT B  Musculoskeletal: M/S 5/5 throughout , Extremities without ischemic changes   Neurologic: CN 2-12 intact , Pain and light touch intact in extremities , Motor exam as listed above  Psychiatric: Judgment intact, Mood & affect appropriate for pt's clinical situation  Dermatologic: See M/S exam for extremity exam, no rashes otherwise noted  Lymph : No Cervical, Axillary, or Inguinal lymphadenopathy   Non-Invasive Vascular Imaging  Vein Mapping  (Date: 08/01/11):   R arm: acceptable vein conduits include basilic vein  L arm: acceptable vein conduits include entire cephalic vein and basilic vein  Outside Studies/Documentation 10 pages of outside documents were reviewed including: outpatient Nephrology clinic chart.  Medical Decision Making  KALEN RATAJCZAK is a 51 y.o. male who presents with chronic kidney disease stage IV  Based on vein mapping and examination, this patient's permanent access options include: R BVT, L BVT, L RC and BC AVF.  I would start with either L RC vs BC AVF  I had an extensive discussion with this patient in regards to the nature of access surgery, including risk, benefits, and alternatives.    The patient is aware that the risks of access surgery include but are not limited to: bleeding, infection, steal syndrome, nerve damage, ischemic monomelic neuropathy, failure of access to mature, and possible need for additional access procedures in the future.  The patient is going to discuss things  with his nephrologist and will contact us to schedule the procedure.  Leonides Sake, MD [facility] [place] Office: [phone] Pager: [phone]  08/01/2011, 7:07 PM

## 2011-08-04 ENCOUNTER — Encounter (HOSPITAL_COMMUNITY): Payer: Medicare Other

## 2011-08-04 DIAGNOSIS — D649 Anemia, unspecified: Secondary | ICD-10-CM | POA: Diagnosis not present

## 2011-08-04 DIAGNOSIS — I129 Hypertensive chronic kidney disease with stage 1 through stage 4 chronic kidney disease, or unspecified chronic kidney disease: Secondary | ICD-10-CM | POA: Diagnosis not present

## 2011-08-04 DIAGNOSIS — N2581 Secondary hyperparathyroidism of renal origin: Secondary | ICD-10-CM | POA: Diagnosis not present

## 2011-08-04 DIAGNOSIS — I10 Essential (primary) hypertension: Secondary | ICD-10-CM | POA: Diagnosis not present

## 2011-08-04 DIAGNOSIS — N184 Chronic kidney disease, stage 4 (severe): Secondary | ICD-10-CM | POA: Diagnosis not present

## 2011-08-05 DIAGNOSIS — I10 Essential (primary) hypertension: Secondary | ICD-10-CM | POA: Diagnosis not present

## 2011-08-05 DIAGNOSIS — I739 Peripheral vascular disease, unspecified: Secondary | ICD-10-CM | POA: Diagnosis not present

## 2011-08-05 DIAGNOSIS — I2510 Atherosclerotic heart disease of native coronary artery without angina pectoris: Secondary | ICD-10-CM | POA: Diagnosis not present

## 2011-08-06 ENCOUNTER — Encounter (HOSPITAL_COMMUNITY): Payer: Medicare Other

## 2011-08-07 ENCOUNTER — Encounter (HOSPITAL_COMMUNITY): Payer: Medicare Other

## 2011-08-08 ENCOUNTER — Encounter (HOSPITAL_COMMUNITY): Payer: Medicare Other

## 2011-08-11 ENCOUNTER — Encounter (HOSPITAL_COMMUNITY): Payer: Medicare Other

## 2011-08-13 ENCOUNTER — Encounter (HOSPITAL_COMMUNITY): Payer: Medicare Other

## 2011-08-15 ENCOUNTER — Encounter (HOSPITAL_COMMUNITY): Payer: Medicare Other

## 2011-08-18 ENCOUNTER — Encounter (HOSPITAL_COMMUNITY): Payer: Medicare Other

## 2011-08-20 ENCOUNTER — Encounter (HOSPITAL_COMMUNITY): Payer: Medicare Other

## 2011-08-20 NOTE — Progress Notes (Signed)
Pt discharged from cardiac rehab program 06/30/2011. Pt had hospital admission for NSTEMI. Pt completed 21 exercise sessions, and attended 10 education classes. Pt hypertensive at times with exercise and at rest.  Pt was showing signs of positive lifestyle changes with expressed desire to incorporate exercise into his daily routine and improve eating habits.  Pt has expressed desire to return to cardiac rehab program once medically stable.

## 2011-08-22 ENCOUNTER — Encounter (HOSPITAL_COMMUNITY): Payer: Medicare Other

## 2011-08-22 DIAGNOSIS — E782 Mixed hyperlipidemia: Secondary | ICD-10-CM | POA: Diagnosis not present

## 2011-08-22 DIAGNOSIS — I2510 Atherosclerotic heart disease of native coronary artery without angina pectoris: Secondary | ICD-10-CM | POA: Diagnosis not present

## 2011-08-22 DIAGNOSIS — I10 Essential (primary) hypertension: Secondary | ICD-10-CM | POA: Diagnosis not present

## 2011-08-25 ENCOUNTER — Encounter (HOSPITAL_COMMUNITY): Payer: Medicare Other

## 2011-08-27 ENCOUNTER — Encounter (HOSPITAL_COMMUNITY): Payer: Medicare Other

## 2011-08-29 ENCOUNTER — Encounter (HOSPITAL_COMMUNITY): Payer: Medicare Other

## 2011-09-01 ENCOUNTER — Encounter (HOSPITAL_COMMUNITY): Payer: Medicare Other

## 2011-09-03 ENCOUNTER — Encounter (HOSPITAL_COMMUNITY): Payer: Medicare Other

## 2011-09-05 ENCOUNTER — Encounter (HOSPITAL_COMMUNITY): Payer: Medicare Other

## 2011-09-08 ENCOUNTER — Encounter (HOSPITAL_COMMUNITY): Payer: Medicare Other

## 2011-09-10 ENCOUNTER — Ambulatory Visit (INDEPENDENT_AMBULATORY_CARE_PROVIDER_SITE_OTHER): Payer: Medicare Other | Admitting: Internal Medicine

## 2011-09-10 ENCOUNTER — Ambulatory Visit (INDEPENDENT_AMBULATORY_CARE_PROVIDER_SITE_OTHER)
Admission: RE | Admit: 2011-09-10 | Discharge: 2011-09-10 | Disposition: A | Discharge status: Home / Self Care | Payer: Medicare Other | Source: Ambulatory Visit | Attending: Internal Medicine | Admitting: Internal Medicine

## 2011-09-10 ENCOUNTER — Encounter: Payer: Self-pay | Admitting: Internal Medicine

## 2011-09-10 ENCOUNTER — Encounter (HOSPITAL_COMMUNITY): Payer: Medicare Other

## 2011-09-10 VITALS — BP 112/70 | HR 66 | Temp 98.7°F | Ht 76.0 in | Wt 237.2 lb

## 2011-09-10 DIAGNOSIS — F3289 Other specified depressive episodes: Secondary | ICD-10-CM

## 2011-09-10 DIAGNOSIS — I10 Essential (primary) hypertension: Secondary | ICD-10-CM | POA: Diagnosis not present

## 2011-09-10 DIAGNOSIS — M25579 Pain in unspecified ankle and joints of unspecified foot: Secondary | ICD-10-CM

## 2011-09-10 DIAGNOSIS — M25571 Pain in right ankle and joints of right foot: Secondary | ICD-10-CM | POA: Insufficient documentation

## 2011-09-10 DIAGNOSIS — F329 Major depressive disorder, single episode, unspecified: Secondary | ICD-10-CM | POA: Diagnosis not present

## 2011-09-10 DIAGNOSIS — M7989 Other specified soft tissue disorders: Secondary | ICD-10-CM | POA: Diagnosis not present

## 2011-09-10 MED ORDER — HYDROCODONE-ACETAMINOPHEN 5-325 MG PO TABS: 1.0000 | ORAL_TABLET | Freq: Four times a day (QID) | ORAL | Status: AC | PRN

## 2011-09-10 NOTE — Patient Instructions (Addendum)
You appear to have either a sprained ankle or tendonitis today Please hold off on more running or long walks until better Please go to XRAY in the Basement for the x-ray test You will be contacted by phone if any changes need to be made immediately.  Otherwise, you will receive a letter about your results with an explanation. Take all new medications as prescribed - the pain medication Continue all other medications as before Please have the pharmacy call with any refills you may need. Please call in 1 -2 wks if not improved for orthopedic referral

## 2011-09-12 ENCOUNTER — Encounter (HOSPITAL_COMMUNITY): Payer: Medicare Other

## 2011-09-14 ENCOUNTER — Encounter: Payer: Self-pay | Admitting: Internal Medicine

## 2011-09-14 NOTE — Assessment & Plan Note (Signed)
Exam c/w sprain vs tendonitis, cant r/o fx - for film today; not c/w gout - d/w pt does not need steroid tx at this time, for pain control

## 2011-09-14 NOTE — Progress Notes (Signed)
Subjective:    Patient ID: [name], male    DOB: 07/07/1960, 51 y.o.   MRN: [id number]  HPI  Here with hx of CAD, CKD, gout and mult med problems after walking more recently, also involved in cardiac rehab;  C/o right ankle sharp  pain/swelling 1 wk mild to mod, worse to walk, better to sit - in fact no real pain or tender without walking or putting wt on it.  Has hx of gout and is asking for steroid tx for ? Gout episode. No fever, no specific trauma.  Denies worsening depressive symptoms, suicidal ideation, or panic.   Pt denies fever, wt loss, night sweats, loss of appetite, or other constitutional symptoms  Pt denies chest pain, increased sob or doe, wheezing, orthopnea, PND, increased LE swelling, palpitations, dizziness or syncope. Pt denies new neurological symptoms such as new headache, or facial or extremity weakness or numbness Past Medical History  Diagnosis Date  . ACHALASIA   . ANEMIA-NOS   . CEREBROVASCULAR ACCIDENT, HX OF   . DEGENERATIVE JOINT DISEASE, SHOULDER   . DEPRESSION   . GERD   . Headache   . HYPERLIPIDEMIA   . HYPERTENSION   . RENAL CALCULUS, HX OF   . RENAL INSUFFICIENCY   . Stroke   . Shortness of breath   . Sleep apnea   . H/O hiatal hernia   . Seizures   . CAD (coronary artery disease), 3 vessel significant disease.  03/28/2011  . Impaired glucose tolerance 05/13/2011  . S/P PTCA (percutaneous transluminal coronary angioplasty), 06/30/11 07/01/2011  . NSTEMI (non-ST elevated myocardial infarction), 06/30/11 07/01/2011  . CKD (chronic kidney disease) stage 4, GFR 15-29 ml/min 07/01/2011  . History of diastolic dysfunction, grade 2 by echo 07/03/2011  . S/P coronary artery stent placement, PTCA/DES Resolute to mid-LAD via the LIMA graft, and PTCA of apical 95% stenosis 07/05/11 07/04/2011   Past Surgical History  Procedure Date  . Tonsillectomy   . Stomach surgery 2009    achalasia  . Knee surgery 01/07/2011    right  . Coronary artery bypass graft  04/01/2011    Procedure: CORONARY ARTERY BYPASS GRAFTING (CABG);  Surgeon: Kathlee Nations Suann Larry, MD;  Location: [facility] OR;  Service: Open Heart Surgery;  Laterality: N/A;  . Insertion of dialysis catheter 04/01/2011    Procedure: INSERTION OF DIALYSIS CATHETER;  Surgeon: Nilda Simmer, MD;  Location: [facility] OR;  Service: Vascular;  Laterality: N/A;    reports that he has never smoked. He has never used smokeless tobacco. He reports that he drinks alcohol. He reports that he does not use illicit drugs. family history includes Hyperlipidemia in his other; Hypertension in his other; and Stroke in his other. Allergies  Allergen Reactions  . Shrimp (Shellfish Allergy) Shortness Of Breath  . Atorvastatin Other (See Comments)    REACTION: weakness  . Insulins Other (See Comments)    Patient unsure as to the kind of insulin but states that it has previously caused seizures. This occurred in the hospital in 2006; but in most recent hospitalization (Jan 2013) received insulin but did not have reaction  . Simvastatin Other (See Comments)    REACTION: abnormal liver tests  . Ultram (Tramadol)    Current Outpatient Prescriptions on File Prior to Visit  Medication Sig Dispense Refill  . acetaminophen (TYLENOL) 325 MG tablet Take 650 mg by mouth every 6 (six) hours as needed. For pain      . amLODipine (NORVASC) 10 MG  tablet Take 1 tablet (10 mg total) by mouth daily.  30 tablet  6  . aspirin EC 81 MG EC tablet Take 1 tablet (81 mg total) by mouth daily.      . cloNIDine (CATAPRES) 0.2 MG tablet Take 0.2 mg by mouth 3 (three) times daily.      . febuxostat (ULORIC) 40 MG tablet Take 40 mg by mouth 2 (two) times daily.      . hydrALAZINE (APRESOLINE) 25 MG tablet Take 1.5 tablets (37.5 mg total) by mouth 3 (three) times daily.  135 tablet  5  . isosorbide mononitrate (IMDUR) 60 MG 24 hr tablet Take 1 tablet (60 mg total) by mouth daily.  30 tablet  6  . metoprolol (LOPRESSOR) 50 MG tablet Take 1 tablet (50 mg  total) by mouth every 8 (eight) hours.  90 tablet  6  . Omega-3 Fatty Acids (FISH OIL) 1200 MG CAPS Take 1 capsule by mouth 3 (three) times daily.      . polysaccharide iron (NIFEREX) 150 MG CAPS capsule Take 150 mg by mouth daily.      . ranolazine (RANEXA) 500 MG 12 hr tablet Take 1 tablet (500 mg total) by mouth every morning.  30 tablet  6  . rosuvastatin (CRESTOR) 20 MG tablet Take 1 tablet (20 mg total) by mouth daily at 6 PM.  30 tablet  6  . terazosin (HYTRIN) 10 MG capsule Take 10 mg by mouth at bedtime.      . Ticagrelor (BRILINTA) 90 MG TABS tablet Take 1 tablet (90 mg total) by mouth 2 (two) times daily.  60 tablet  11   Review of Systems Constitutional: Negative for diaphoresis and unexpected weight change.  HENT: Negative for drooling and tinnitus.   Eyes: Negative for photophobia and visual disturbance.  Respiratory: Negative for choking and stridor.   Gastrointestinal: Negative for vomiting and blood in stool.  Genitourinary: Negative for hematuria and decreased urine volume.  Musculoskeletal: Negative for gait problem.  Skin: Negative for color change and wound.  Neurological: Negative for tremors and numbness.    Objective:   Physical Exam BP 112/70  Pulse 66  Temp 98.7 F (37.1 C) (Oral)  Ht 6\' 4"  (1.93 m)  Wt 237 lb 4 oz (107.616 kg)  BMI 28.88 kg/m2  SpO2 98% Physical Exam  VS noted Constitutional: Pt appears well-developed and well-nourished.  HENT: Head: Normocephalic.  Right Ear: External ear normal.  Left Ear: External ear normal.  Eyes: Conjunctivae and EOM are normal. Pupils are equal, round, and reactive to light.  Neck: Normal range of motion. Neck supple.  Cardiovascular: Normal rate and regular rhythm.   Pulmonary/Chest: Effort normal and breath sounds normal.  Right ankle with 1-2+ mild tender, swelling laterally only about the lateral ankle and tarsal tunnel - o/w neurovasc intact Neurological: Pt is alert. Not confused Skin: Skin is warm. No  erythema.  Psychiatric: Pt behavior is normal. Thought content normal. not depressed affect    Assessment & Plan:

## 2011-09-14 NOTE — Assessment & Plan Note (Signed)
stable overall by hx and exam, most recent data reviewed with pt, and pt to continue medical treatment as before BP Readings from Last 3 Encounters:  09/10/11 112/70  08/01/11 157/100  07/25/11 148/90

## 2011-09-14 NOTE — Assessment & Plan Note (Signed)
stable overall by hx and exam, most recent data reviewed with pt, and pt to continue medical treatment as before Lab Results  Component Value Date   WBC 7.1 07/07/2011   HGB 9.8* 07/07/2011   HCT 28.8* 07/07/2011   PLT 260 07/07/2011   GLUCOSE 116* 07/07/2011   CHOL 107 07/01/2011   TRIG 124 07/01/2011   HDL 29* 07/01/2011   LDLDIRECT 129.4 06/23/2008   LDLCALC 53 07/01/2011   ALT 19 07/02/2011   AST 15 07/02/2011   NA 135 07/07/2011   K 4.3 07/07/2011   CL 105 07/07/2011   CREATININE 4.21* 07/07/2011   BUN 35* 07/07/2011   CO2 20 07/07/2011   TSH 0.414 06/30/2011   PSA 2.96 05/13/2011   INR 1.03 07/04/2011   HGBA1C 6.3* 06/30/2011

## 2011-09-15 ENCOUNTER — Encounter (HOSPITAL_COMMUNITY): Payer: Medicare Other

## 2011-09-17 ENCOUNTER — Encounter (HOSPITAL_COMMUNITY): Payer: Medicare Other

## 2011-09-19 ENCOUNTER — Encounter (HOSPITAL_COMMUNITY): Payer: Medicare Other

## 2011-09-22 ENCOUNTER — Encounter (HOSPITAL_COMMUNITY): Payer: Medicare Other

## 2011-09-22 NOTE — Procedures (Unsigned)
CEPHALIC VEIN MAPPING  INDICATION:  Preoperative vein mapping for access placement.  HISTORY: Kidney disease.  EXAM:  The right cephalic vein is compressible, with diameter measurements ranging from 0.24 to 0.10 cm.  The right basilic vein is compressible, with diameter measurements ranging from 0.48 to 0.49 cm.  The left cephalic vein is compressible, with diameter measurements ranging from 0.54 to 0.27 cm.  The left basilic vein is compressible, with diameter measurements ranging from 0.52 to 0.32 cm.  IMPRESSION:  Patent right and left cephalic and basilic veins with diameter measurements as described above.  ___________________________________________ Fransisco Hertz, MD  EM/MEDQ  D:  08/01/2011  T:  08/01/2011  Job:  [id number]

## 2011-09-24 ENCOUNTER — Encounter (HOSPITAL_COMMUNITY): Payer: Medicare Other

## 2011-09-26 ENCOUNTER — Encounter (HOSPITAL_COMMUNITY): Payer: Medicare Other

## 2011-09-29 ENCOUNTER — Encounter (HOSPITAL_COMMUNITY): Payer: Medicare Other

## 2011-10-01 ENCOUNTER — Encounter (HOSPITAL_COMMUNITY): Payer: Medicare Other

## 2011-10-03 ENCOUNTER — Encounter (HOSPITAL_COMMUNITY): Payer: Medicare Other

## 2011-10-06 ENCOUNTER — Encounter (HOSPITAL_COMMUNITY): Payer: Medicare Other

## 2011-10-08 ENCOUNTER — Encounter (HOSPITAL_COMMUNITY): Payer: Medicare Other

## 2011-10-10 ENCOUNTER — Encounter (HOSPITAL_COMMUNITY): Payer: Medicare Other

## 2011-10-13 ENCOUNTER — Encounter (HOSPITAL_COMMUNITY): Payer: Medicare Other

## 2011-10-15 ENCOUNTER — Encounter (HOSPITAL_COMMUNITY): Payer: Medicare Other

## 2011-10-17 ENCOUNTER — Encounter (HOSPITAL_COMMUNITY): Payer: Medicare Other

## 2011-10-20 ENCOUNTER — Encounter (HOSPITAL_COMMUNITY): Payer: Medicare Other

## 2011-10-21 DIAGNOSIS — I10 Essential (primary) hypertension: Secondary | ICD-10-CM | POA: Diagnosis not present

## 2011-10-22 ENCOUNTER — Encounter (HOSPITAL_COMMUNITY): Payer: Medicare Other

## 2011-10-24 ENCOUNTER — Encounter (HOSPITAL_COMMUNITY): Payer: Medicare Other

## 2011-10-27 ENCOUNTER — Encounter (HOSPITAL_COMMUNITY): Payer: Medicare Other

## 2011-10-29 ENCOUNTER — Encounter (HOSPITAL_COMMUNITY): Payer: Medicare Other

## 2011-10-31 ENCOUNTER — Encounter (HOSPITAL_COMMUNITY): Payer: Medicare Other

## 2011-11-03 ENCOUNTER — Encounter (HOSPITAL_COMMUNITY): Payer: Medicare Other

## 2011-11-05 ENCOUNTER — Encounter (HOSPITAL_COMMUNITY): Payer: Medicare Other

## 2011-11-07 ENCOUNTER — Other Ambulatory Visit: Payer: Self-pay | Admitting: *Deleted

## 2011-11-07 ENCOUNTER — Encounter (HOSPITAL_COMMUNITY): Payer: Medicare Other

## 2011-11-07 ENCOUNTER — Encounter: Payer: Self-pay | Admitting: *Deleted

## 2011-11-07 DIAGNOSIS — N184 Chronic kidney disease, stage 4 (severe): Secondary | ICD-10-CM | POA: Diagnosis not present

## 2011-11-07 DIAGNOSIS — N2581 Secondary hyperparathyroidism of renal origin: Secondary | ICD-10-CM | POA: Diagnosis not present

## 2011-11-07 DIAGNOSIS — D649 Anemia, unspecified: Secondary | ICD-10-CM | POA: Diagnosis not present

## 2011-11-11 ENCOUNTER — Encounter: Payer: Self-pay | Admitting: Internal Medicine

## 2011-11-11 ENCOUNTER — Other Ambulatory Visit (INDEPENDENT_AMBULATORY_CARE_PROVIDER_SITE_OTHER): Payer: Medicare Other

## 2011-11-11 ENCOUNTER — Ambulatory Visit (INDEPENDENT_AMBULATORY_CARE_PROVIDER_SITE_OTHER): Payer: Medicare Other | Admitting: Internal Medicine

## 2011-11-11 VITALS — BP 112/78 | HR 57 | Temp 97.3°F | Ht 72.0 in | Wt 230.5 lb

## 2011-11-11 DIAGNOSIS — I10 Essential (primary) hypertension: Secondary | ICD-10-CM | POA: Diagnosis not present

## 2011-11-11 DIAGNOSIS — R972 Elevated prostate specific antigen [PSA]: Secondary | ICD-10-CM

## 2011-11-11 DIAGNOSIS — K122 Cellulitis and abscess of mouth: Secondary | ICD-10-CM | POA: Insufficient documentation

## 2011-11-11 MED ORDER — AMOXICILLIN 500 MG PO CAPS: 1000.0000 mg | ORAL_CAPSULE | Freq: Two times a day (BID) | ORAL | Status: AC

## 2011-11-11 NOTE — Assessment & Plan Note (Signed)
stable overall by hx and exam, most recent data reviewed with pt, and pt to continue medical treatment as before BP Readings from Last 3 Encounters:  11/11/11 112/78  09/10/11 112/70  08/01/11 157/100

## 2011-11-11 NOTE — Progress Notes (Signed)
Subjective:    Patient ID: [name], male    DOB: Jan 03, 1961, 51 y.o.   MRN: [id number]  HPI  Here with acute onset 3 days red,tender, swollen area to gum at the right lower most post molar; drained some last night with decreased pain, no fever, ST , cough , sinus or ear symptoms.  Pt denies chest pain, increased sob or doe, wheezing, orthopnea, PND, increased LE swelling, palpitations, dizziness or syncope.  Pt denies new neurological symptoms such as new headache, or facial or extremity weakness or numbness   Pt denies polydipsia, polyuria. Due for shunt placement sept 2013  Past Medical History  Diagnosis Date  . ACHALASIA   . ANEMIA-NOS   . CEREBROVASCULAR ACCIDENT, HX OF   . DEGENERATIVE JOINT DISEASE, SHOULDER   . DEPRESSION   . GERD   . Headache   . HYPERLIPIDEMIA   . HYPERTENSION   . RENAL CALCULUS, HX OF   . RENAL INSUFFICIENCY   . Stroke   . Shortness of breath   . Sleep apnea   . H/O hiatal hernia   . Seizures   . CAD (coronary artery disease), 3 vessel significant disease.  03/28/2011  . Impaired glucose tolerance 05/13/2011  . S/P PTCA (percutaneous transluminal coronary angioplasty), 06/30/11 07/01/2011  . NSTEMI (non-ST elevated myocardial infarction), 06/30/11 07/01/2011  . CKD (chronic kidney disease) stage 4, GFR 15-29 ml/min 07/01/2011  . History of diastolic dysfunction, grade 2 by echo 07/03/2011  . S/P coronary artery stent placement, PTCA/DES Resolute to mid-LAD via the LIMA graft, and PTCA of apical 95% stenosis 07/05/11 07/04/2011   Past Surgical History  Procedure Date  . Tonsillectomy   . Stomach surgery 2009    achalasia  . Knee surgery 01/07/2011    right  . Coronary artery bypass graft 04/01/2011    Procedure: CORONARY ARTERY BYPASS GRAFTING (CABG);  Surgeon: Kathlee Nations Suann Larry, MD;  Location: [facility] OR;  Service: Open Heart Surgery;  Laterality: N/A;  . Insertion of dialysis catheter 04/01/2011    Procedure: INSERTION OF DIALYSIS CATHETER;  Surgeon:  Nilda Simmer, MD;  Location: [facility] OR;  Service: Vascular;  Laterality: N/A;    reports that he has never smoked. He has never used smokeless tobacco. He reports that he drinks alcohol. He reports that he does not use illicit drugs. family history includes Hyperlipidemia in his other; Hypertension in his other; and Stroke in his other. Allergies  Allergen Reactions  . Shrimp (Shellfish Allergy) Shortness Of Breath  . Atorvastatin Other (See Comments)    REACTION: weakness  . Insulins Other (See Comments)    Patient unsure as to the kind of insulin but states that it has previously caused seizures. This occurred in the hospital in 2006; but in most recent hospitalization (Jan 2013) received insulin but did not have reaction  . Simvastatin Other (See Comments)    REACTION: abnormal liver tests  . Ultram (Tramadol)    Current Outpatient Prescriptions on File Prior to Visit  Medication Sig Dispense Refill  . acetaminophen (TYLENOL) 325 MG tablet Take 650 mg by mouth every 6 (six) hours as needed. For pain      . amLODipine (NORVASC) 10 MG tablet Take 1 tablet (10 mg total) by mouth daily.  30 tablet  6  . aspirin EC 81 MG EC tablet Take 1 tablet (81 mg total) by mouth daily.      . cloNIDine (CATAPRES) 0.2 MG tablet Take 0.2 mg by mouth 3 (  three) times daily.      . febuxostat (ULORIC) 40 MG tablet Take 40 mg by mouth 2 (two) times daily.      . isosorbide mononitrate (IMDUR) 60 MG 24 hr tablet Take 1 tablet (60 mg total) by mouth daily.  30 tablet  6  . metoprolol (LOPRESSOR) 50 MG tablet Take 1 tablet (50 mg total) by mouth every 8 (eight) hours.  90 tablet  6  . Omega-3 Fatty Acids (FISH OIL) 1200 MG CAPS Take 1 capsule by mouth 3 (three) times daily.      . polysaccharide iron (NIFEREX) 150 MG CAPS capsule Take 150 mg by mouth daily.      . ranolazine (RANEXA) 500 MG 12 hr tablet Take 1 tablet (500 mg total) by mouth every morning.  30 tablet  6  . rosuvastatin (CRESTOR) 20 MG tablet  Take 1 tablet (20 mg total) by mouth daily at 6 PM.  30 tablet  6  . terazosin (HYTRIN) 10 MG capsule Take 10 mg by mouth at bedtime.      . Ticagrelor (BRILINTA) 90 MG TABS tablet Take 1 tablet (90 mg total) by mouth 2 (two) times daily.  60 tablet  11  . DISCONTD: hydrALAZINE (APRESOLINE) 25 MG tablet Take 1.5 tablets (37.5 mg total) by mouth 3 (three) times daily.  135 tablet  5   Review of Systems Review of Systems  Constitutional: Negative for diaphoresis and unexpected weight change.  HENT: Negative for drooling and tinnitus.   Eyes: Negative for photophobia and visual disturbance.  Respiratory: Negative for choking and stridor.   Gastrointestinal: Negative for vomiting and blood in stool.  Genitourinary: Negative for hematuria and decreased urine volume.  Musculoskeletal: Negative for gait problem.  Neurological: Negative for tremors and numbness.  Objective:   Physical Exam BP 112/78  Pulse 57  Temp 97.3 F (36.3 C) (Oral)  Ht 6' (1.829 m)  Wt 230 lb 8 oz (104.554 kg)  BMI 31.26 kg/m2  SpO2 98% Physical Exam  VS noted Constitutional: Pt appears well-developed and well-nourished.  HENT: Head: Normocephalic.  Right Ear: External ear normal.  Left Ear: External ear normal.  Right post molar, low jaw with 1/2 cm area red, tender without further drainage Eyes: Conjunctivae and EOM are normal. Pupils are equal, round, and reactive to light.  Neck: Normal range of motion. Neck supple.  Cardiovascular: Normal rate and regular rhythm.   Pulmonary/Chest: Effort normal and breath sounds normal.  Neurological: Pt is alert. Not confused Skin: Skin is warm. No erythema. No rash Psychiatric: Pt behavior is normal. Thought content normal.     Assessment & Plan:

## 2011-11-11 NOTE — Patient Instructions (Addendum)
Take all new medications as prescribed Continue all other medications as before Please go to LAB in the Basement for the blood and/or urine tests to be done today You will be contacted by phone if any changes need to be made immediately.  Otherwise, you will receive a letter about your results with an explanation. Please return in 6 months, or sooner if needed 

## 2011-11-11 NOTE — Assessment & Plan Note (Signed)
Mild to mod, for antibx course,  to f/u any worsening symptoms or concerns 

## 2011-11-11 NOTE — Assessment & Plan Note (Signed)
With psa elev last visit from 2.0 to 2.9 in 1 yr, for f/u psa today

## 2011-11-26 ENCOUNTER — Other Ambulatory Visit (HOSPITAL_COMMUNITY): Payer: Medicare Other

## 2011-11-27 ENCOUNTER — Encounter (HOSPITAL_COMMUNITY): Payer: Self-pay

## 2011-11-27 ENCOUNTER — Encounter (HOSPITAL_COMMUNITY)
Admission: RE | Admit: 2011-11-27 | Discharge: 2011-11-27 | Disposition: A | Discharge status: Home / Self Care | Payer: Medicare Other | Source: Ambulatory Visit | Attending: Vascular Surgery | Admitting: Vascular Surgery

## 2011-11-27 DIAGNOSIS — N185 Chronic kidney disease, stage 5: Secondary | ICD-10-CM | POA: Diagnosis not present

## 2011-11-27 DIAGNOSIS — I120 Hypertensive chronic kidney disease with stage 5 chronic kidney disease or end stage renal disease: Secondary | ICD-10-CM | POA: Diagnosis not present

## 2011-11-27 LAB — SURGICAL PCR SCREEN
MRSA, PCR: NEGATIVE
Staphylococcus aureus: NEGATIVE

## 2011-11-27 NOTE — Pre-Procedure Instructions (Signed)
[address]  11/27/2011   Your procedure is scheduled on: Tuesday  12/02/11  Report to [facility] at 530 AM.  Call this number if you have problems the morning of surgery: [phone]   Remember:   Do not eat food  OR DRINK After Midnight.    Take these medicines the morning of surgery with A SIP OF WATER: TYLENOL, NORVASC, CLONIDINE, ULORIC, IMDUR, METOPROLOL, RANEXA, HYTRIN,  APRESOLINE    Do not wear jewelry, make-up or nail polish.  Do not wear lotions, powders, or perfumes. You may wear deodorant.  Do not shave 48 hours prior to surgery. Men may shave face and neck.  Do not bring valuables to the hospital.  Contacts, dentures or bridgework may not be worn into surgery.  Leave suitcase in the car. After surgery it may be brought to your room.  For patients admitted to the hospital, checkout time is 11:00 AM the day of discharge.   Patients discharged the day of surgery will not be allowed to drive home.  Name and phone number of your driver:   Special Instructions: CHG Shower Use Special Wash: 1/2 bottle night before surgery and 1/2 bottle morning of surgery.   Please read over the following fact sheets that you were given: Pain Booklet, Coughing and Deep Breathing, MRSA Information and Surgical Site Infection Prevention

## 2011-11-27 NOTE — Progress Notes (Signed)
SPOKE WITH CAROL AT DR [facility] OFFICE WHO STATED PATIENT SHOULD STOP BRILINTAL TODAY.  PATIENT MADE AWARE.  REQUESTED SLEEP STUDY FROM DR [facility] [phone]  AND STRESS TEST, OV, ?ECHO FROM DR [facility] OFFICE.

## 2011-11-28 NOTE — Progress Notes (Signed)
Requested sleep study from Dr. Teresita Madura [phone].

## 2011-11-28 NOTE — Consult Note (Signed)
Anesthesia Chart Review:  Patient is a 51 year old male scheduled for creation of LUE AVF on 12/02/11.  History includes CAD/MI s/p CABG (LIMA to LAD, SVG to OM1/2, SVG to PL) 04/01/11 then presented with acute NSTEMI 06/30/11 and required PTCA at the SVG to OM anastomosis site and a stent to the mid-distal LAD via the LIMA graft.  Other history includes non-smoker, HTN, OSA, CVA, GERD, HLD, hiatal hernia, CKD stage 5 (Dr. Hyman Hopes), depression, anemia, surgery fro achalasia '09. PCP is Dr. Oliver Barre.  Cardiologist is Dr. Allyson Sabal [facility]).  He was last seen on 08/22/11 and knew that patient was scheduled to have an AVF in the near future.  At that time, he felt patient was not having any ischemia chest pain and recommended continued medical management with six month follow-up.  His last EKG at [facility] was on 07/28/11 and showed SB @ 55, possible LAE, ST/T wave abnormality, consider lateral ischemia.  It appears stable since at least 07/06/11.    Echo on 08/05/11 [facility]) showed moderate concentric LVH, EF approximately 35%, Doppler findings suggestive of impaired LV relaxation, moderate to severe septal hypokinesis, moderate apical wall hypokinesis, moderately dilated LA, borderline right atrial enlargement, right ventricular systolic pressure was normal, aortic valve appeared to be mildly sclerotic, trace aortic regurgitation, no aortic stenosis.  His last stress test was on 06/25/2011; however that was prior to his STEMI on 06/30/11.  (It showed evidence of mild ischemia in the basal inferolateral and mid inferolateral regions, EF 44% correlating with mild residual ischemia in the distal CX or RCA territory.)  He has had multiple cardiac catheterizations since January 2013, last in April 2013 with interventions as outlined above.  See Epic for complete reports.  Chest x-ray on 04/30/2011 showed no acute disease.  Patient is for labs (ISTAT) on arrival.  By notes, Dr. Allyson Sabal is aware of plan for AVF.  If no new CV  symptoms then anticipate patient can proceed as planned.  Shonna Chock, PA-C

## 2011-12-01 MED ORDER — DEXTROSE 5 % IV SOLN
1.5000 g | INTRAVENOUS | Status: AC
  Administered 2011-12-02: 1.5 g via INTRAVENOUS
  Filled 2011-12-01: qty 1.5

## 2011-12-02 ENCOUNTER — Encounter (HOSPITAL_COMMUNITY): Payer: Self-pay | Admitting: *Deleted

## 2011-12-02 ENCOUNTER — Encounter (HOSPITAL_COMMUNITY): Payer: Self-pay | Admitting: Vascular Surgery

## 2011-12-02 ENCOUNTER — Ambulatory Visit (HOSPITAL_COMMUNITY)
Admission: RE | Admit: 2011-12-02 | Discharge: 2011-12-02 | Disposition: A | Discharge status: Home / Self Care | Payer: Medicare Other | Source: Ambulatory Visit | Attending: Vascular Surgery | Admitting: Vascular Surgery

## 2011-12-02 ENCOUNTER — Encounter (HOSPITAL_COMMUNITY)
Admission: RE | Disposition: A | Discharge status: Home / Self Care | Payer: Self-pay | Source: Ambulatory Visit | Attending: Vascular Surgery

## 2011-12-02 ENCOUNTER — Ambulatory Visit (HOSPITAL_COMMUNITY): Payer: Medicare Other | Admitting: Vascular Surgery

## 2011-12-02 ENCOUNTER — Telehealth: Payer: Self-pay | Admitting: Vascular Surgery

## 2011-12-02 DIAGNOSIS — I2510 Atherosclerotic heart disease of native coronary artery without angina pectoris: Secondary | ICD-10-CM | POA: Diagnosis not present

## 2011-12-02 DIAGNOSIS — I120 Hypertensive chronic kidney disease with stage 5 chronic kidney disease or end stage renal disease: Secondary | ICD-10-CM | POA: Insufficient documentation

## 2011-12-02 DIAGNOSIS — N185 Chronic kidney disease, stage 5: Secondary | ICD-10-CM | POA: Insufficient documentation

## 2011-12-02 DIAGNOSIS — N184 Chronic kidney disease, stage 4 (severe): Secondary | ICD-10-CM

## 2011-12-02 DIAGNOSIS — K219 Gastro-esophageal reflux disease without esophagitis: Secondary | ICD-10-CM | POA: Diagnosis not present

## 2011-12-02 DIAGNOSIS — I10 Essential (primary) hypertension: Secondary | ICD-10-CM | POA: Diagnosis not present

## 2011-12-02 DIAGNOSIS — N186 End stage renal disease: Secondary | ICD-10-CM | POA: Diagnosis not present

## 2011-12-02 HISTORY — PX: AV FISTULA PLACEMENT: SHX1204

## 2011-12-02 LAB — PROTIME-INR: Prothrombin Time: 14.5 seconds (ref 11.6–15.2)

## 2011-12-02 SURGERY — ARTERIOVENOUS (AV) FISTULA CREATION
Anesthesia: Monitor Anesthesia Care | Site: Arm Lower | Laterality: Left | Wound class: Clean

## 2011-12-02 MED ORDER — MIDAZOLAM HCL 5 MG/5ML IJ SOLN
INTRAMUSCULAR | Status: DC | PRN
  Administered 2011-12-02: 2 mg via INTRAVENOUS

## 2011-12-02 MED ORDER — OXYCODONE HCL 5 MG PO TABS: 5.0000 mg | ORAL_TABLET | ORAL | Status: DC | PRN

## 2011-12-02 MED ORDER — BUPIVACAINE HCL (PF) 0.5 % IJ SOLN
INTRAMUSCULAR | Status: AC
  Filled 2011-12-02: qty 30

## 2011-12-02 MED ORDER — LIDOCAINE-EPINEPHRINE (PF) 1 %-1:200000 IJ SOLN
INTRAMUSCULAR | Status: AC
  Filled 2011-12-02: qty 10

## 2011-12-02 MED ORDER — LIDOCAINE-EPINEPHRINE (PF) 1 %-1:200000 IJ SOLN
INTRAMUSCULAR | Status: DC | PRN
  Administered 2011-12-02: 30 mL

## 2011-12-02 MED ORDER — SODIUM CHLORIDE 0.9 % IV SOLN
INTRAVENOUS | Status: DC | PRN
  Administered 2011-12-02: 07:00:00 via INTRAVENOUS

## 2011-12-02 MED ORDER — ONDANSETRON HCL 4 MG/2ML IJ SOLN: 4.0000 mg | Freq: Four times a day (QID) | INTRAMUSCULAR | Status: DC | PRN

## 2011-12-02 MED ORDER — SODIUM CHLORIDE 0.9 % IV SOLN: INTRAVENOUS | Status: DC

## 2011-12-02 MED ORDER — FENTANYL CITRATE 0.05 MG/ML IJ SOLN
INTRAMUSCULAR | Status: AC
  Filled 2011-12-02: qty 2

## 2011-12-02 MED ORDER — BUPIVACAINE HCL (PF) 0.5 % IJ SOLN
INTRAMUSCULAR | Status: DC | PRN
  Administered 2011-12-02: 30 mL

## 2011-12-02 MED ORDER — FENTANYL CITRATE 0.05 MG/ML IJ SOLN
25.0000 ug | INTRAMUSCULAR | Status: DC | PRN
  Administered 2011-12-02: 50 ug via INTRAVENOUS

## 2011-12-02 MED ORDER — THROMBIN 20000 UNITS EX SOLR
CUTANEOUS | Status: DC | PRN
  Administered 2011-12-02: 09:00:00 via TOPICAL

## 2011-12-02 MED ORDER — SODIUM CHLORIDE 0.9 % IR SOLN
Status: DC | PRN
  Administered 2011-12-02: 08:00:00

## 2011-12-02 MED ORDER — THROMBIN 20000 UNITS EX SOLR
CUTANEOUS | Status: AC
  Filled 2011-12-02: qty 20000

## 2011-12-02 MED ORDER — FENTANYL CITRATE 0.05 MG/ML IJ SOLN
INTRAMUSCULAR | Status: DC | PRN
  Administered 2011-12-02: 75 ug via INTRAVENOUS
  Administered 2011-12-02: 50 ug via INTRAVENOUS

## 2011-12-02 MED ORDER — PROPOFOL INFUSION 10 MG/ML OPTIME
INTRAVENOUS | Status: DC | PRN
  Administered 2011-12-02: 25 ug/kg/min via INTRAVENOUS

## 2011-12-02 MED ORDER — 0.9 % SODIUM CHLORIDE (POUR BTL) OPTIME
TOPICAL | Status: DC | PRN
  Administered 2011-12-02: 1000 mL

## 2011-12-02 SURGICAL SUPPLY — 41 items
CANISTER SUCTION 2500CC (MISCELLANEOUS) ×2 IMPLANT
CLIP TI MEDIUM 6 (CLIP) ×2 IMPLANT
CLIP TI WIDE RED SMALL 6 (CLIP) ×2 IMPLANT
CLOTH BEACON ORANGE TIMEOUT ST (SAFETY) ×2 IMPLANT
COVER PROBE W GEL 5X96 (DRAPES) ×2 IMPLANT
COVER SURGICAL LIGHT HANDLE (MISCELLANEOUS) ×2 IMPLANT
DECANTER SPIKE VIAL GLASS SM (MISCELLANEOUS) ×2 IMPLANT
DERMABOND ADVANCED (GAUZE/BANDAGES/DRESSINGS) ×1
DERMABOND ADVANCED .7 DNX12 (GAUZE/BANDAGES/DRESSINGS) ×1 IMPLANT
DRAIN PENROSE 1/2X12 LTX STRL (WOUND CARE) IMPLANT
ELECT REM PT RETURN 9FT ADLT (ELECTROSURGICAL) ×2
ELECTRODE REM PT RTRN 9FT ADLT (ELECTROSURGICAL) ×1 IMPLANT
GLOVE BIO SURGEON STRL SZ7 (GLOVE) ×4 IMPLANT
GLOVE BIOGEL PI IND STRL 6.5 (GLOVE) ×2 IMPLANT
GLOVE BIOGEL PI IND STRL 7.0 (GLOVE) ×1 IMPLANT
GLOVE BIOGEL PI IND STRL 7.5 (GLOVE) ×2 IMPLANT
GLOVE BIOGEL PI INDICATOR 6.5 (GLOVE) ×2
GLOVE BIOGEL PI INDICATOR 7.0 (GLOVE) ×1
GLOVE BIOGEL PI INDICATOR 7.5 (GLOVE) ×2
GLOVE ECLIPSE 6.5 STRL STRAW (GLOVE) ×4 IMPLANT
GLOVE SS BIOGEL STRL SZ 7 (GLOVE) ×1 IMPLANT
GLOVE SUPERSENSE BIOGEL SZ 7 (GLOVE) ×1
GLOVE SURG SS PI 7.0 STRL IVOR (GLOVE) ×2 IMPLANT
GOWN SRG XL XLNG 56XLVL 4 (GOWN DISPOSABLE) ×1 IMPLANT
GOWN STRL NON-REIN LRG LVL3 (GOWN DISPOSABLE) ×8 IMPLANT
GOWN STRL NON-REIN XL XLG LVL4 (GOWN DISPOSABLE) ×1
KIT BASIN OR (CUSTOM PROCEDURE TRAY) ×2 IMPLANT
KIT ROOM TURNOVER OR (KITS) ×2 IMPLANT
NS IRRIG 1000ML POUR BTL (IV SOLUTION) ×2 IMPLANT
PACK CV ACCESS (CUSTOM PROCEDURE TRAY) ×2 IMPLANT
PAD ARMBOARD 7.5X6 YLW CONV (MISCELLANEOUS) ×4 IMPLANT
SPONGE SURGIFOAM ABS GEL 100 (HEMOSTASIS) ×2 IMPLANT
SUT MNCRL AB 4-0 PS2 18 (SUTURE) ×2 IMPLANT
SUT PROLENE 6 0 BV (SUTURE) ×2 IMPLANT
SUT PROLENE 7 0 BV 1 (SUTURE) ×2 IMPLANT
SUT VIC AB 3-0 SH 27 (SUTURE) ×1
SUT VIC AB 3-0 SH 27X BRD (SUTURE) ×1 IMPLANT
TOWEL OR 17X24 6PK STRL BLUE (TOWEL DISPOSABLE) ×2 IMPLANT
TOWEL OR 17X26 10 PK STRL BLUE (TOWEL DISPOSABLE) ×2 IMPLANT
UNDERPAD 30X30 INCONTINENT (UNDERPADS AND DIAPERS) ×2 IMPLANT
WATER STERILE IRR 1000ML POUR (IV SOLUTION) ×2 IMPLANT

## 2011-12-02 NOTE — Anesthesia Procedure Notes (Signed)
Procedure Name: MAC Date/Time: 12/02/2011 7:35 AM Performed by: Carmela Rima Pre-anesthesia Checklist: Patient identified, Timeout performed, Emergency Drugs available, Suction available and Patient being monitored Oxygen Delivery Method: Nasal cannula

## 2011-12-02 NOTE — Anesthesia Postprocedure Evaluation (Signed)
Anesthesia Post Note  Patient: [name]  Procedure(s) Performed: Procedure(s) (LRB): ARTERIOVENOUS (AV) FISTULA CREATION (Left)  Anesthesia type: MAC  Patient location: PACU  Post pain: Pain level controlled and Adequate analgesia  Post assessment: Post-op Vital signs reviewed, Patient's Cardiovascular Status Stable and Respiratory Function Stable  Last Vitals:  Filed Vitals:   12/02/11 0930  BP:   Pulse:   Temp:   Resp: 15    Post vital signs: Reviewed and stable  Level of consciousness: awake, alert  and oriented  Complications: No apparent anesthesia complications

## 2011-12-02 NOTE — Transfer of Care (Signed)
Immediate Anesthesia Transfer of Care Note  Patient: [name]  Procedure(s) Performed: Procedure(s) (LRB) with comments: ARTERIOVENOUS (AV) FISTULA CREATION (Left) - Ultrasound Guided  Patient Location: PACU  Anesthesia Type: MAC  Level of Consciousness: awake, alert  and oriented  Airway & Oxygen Therapy: Patient Spontanous Breathing and Patient connected to nasal cannula oxygen  Post-op Assessment: Report given to PACU RN, Post -op Vital signs reviewed and stable and Patient moving all extremities X 4  Post vital signs: Reviewed and stable  Complications: No apparent anesthesia complications

## 2011-12-02 NOTE — Telephone Encounter (Signed)
LVM for pt, dpm

## 2011-12-02 NOTE — Op Note (Signed)
OPERATIVE NOTE   PROCEDURE: left radiocephaic arteriovenous fistula placement  PRE-OPERATIVE DIAGNOSIS: chronic kidney disease stage IV-V   POST-OPERATIVE DIAGNOSIS: same as above   SURGEON: Nilda Simmer, MD  ASSISTANT(S): Lianne Cure, PAC   ANESTHESIA: local and MAC  ESTIMATED BLOOD LOSS: 30 cc  FINDING(S): 1.  Weakly palpable thrill at end of case 2.  Dopplerable radial signal at end of case  SPECIMEN(S):  none  INDICATIONS:   [name] is a 51 y.o. male who  presents with chronic kidney disease stage IV-V .  The patient is scheduled for left radiocephalic arteriovenous fistula placement.  The patient is aware the risks include but are not limited to: bleeding, infection, steal syndrome, nerve damage, ischemic monomelic neuropathy, failure to mature, and need for additional procedures.  The patient is aware of the risks of the procedure and elects to proceed forward.  DESCRIPTION: After full informed written consent was obtained from the patient, the patient was brought back to the operating room and placed supine upon the operating table.  Prior to induction, the patient received IV antibiotics.   After obtaining adequate anesthesia, the patient was then prepped and draped in the standard fashion for a left arm access procedure.  I turned my attention first to identifying the patient's distal cephalic vein and radial artery.  Using SonoSite guidance, the location of these vessels were marked out on the skin.   At this point, I injected local anesthetic to obtain a field block of the wrist.  In total, I injected about 10 mL of a 1:1 mixture of 0.5% Marcaine without epinephrine and 1% lidocaine with epinephrine.  I made a transverse incision at the level of the wrist and dissected through the subcutaneous tissue and fascia to gain exposure of the radial artery.  This was noted to be 2.5 mm in diameter externally.  This was dissected out proximally and distally and  controlled with vessel loops .  I then dissected out the cephalic vein.  This was noted to be 3 mm in diameter externally.  The distal segment of the vein was ligated with a  2-0 silk, and the vein was transected.  The proximal segment was iinterrogated with serial dilators.  The vein accepted up to a 3.5 mm dilator without any difficulty.  I then instilled the heparinized saline into the vein and clamped it.  At this point, I reset my exposure of the radial artery and placed the artery under tension proximally and distally.  I made an arteriotomy with a #11 blade, and then I extended the arteriotomy with a Potts scissor.  I injected heparinized saline proximal and distal to this arteriotomy.  The vein was then sewn to the artery in an end-to-side configuration with a running stitch of 7-0 Prolene.  Prior to completing this anastomosis, I allowed the vein and artery to backbleed.  There was no evidence of clot from any vessels.  I completed the anastomosis in the usual fashion and then released all vessel loops and clamps.  There was a weakly palpable thrill in the venous outflow, and there was a dopplerable radial pulse.  At this point, I irrigated out the surgical wound.  There was no further active bleeding.  The subcutaneous tissue was reapproximated with a running stitch of 3-0 Vicryl.  The skin was then reapproximated with a running subcuticular stitch of 4-0 Vicryl.  The skin was then cleaned, dried, and reinforced with Dermabond.  The patient tolerated this procedure well.  COMPLICATIONS: none  CONDITION: stable   CHEN,BRIAN LIANG-YU, MD 12/02/2011 9:12 AM

## 2011-12-02 NOTE — Anesthesia Preprocedure Evaluation (Addendum)
Anesthesia Evaluation  Patient identified by MRN, date of birth, ID band Patient awake    Reviewed: Allergy & Precautions, H&P , NPO status , Patient's Chart, lab work & pertinent test results  Airway Mallampati: III  Neck ROM: full    Dental  (+) Dental Advidsory Given and Teeth Intact   Pulmonary sleep apnea ,          Cardiovascular hypertension, + angina + CAD, + Past MI, + Cardiac Stents and + CABG     Neuro/Psych  Headaches, Seizures -,  Depression CVA    GI/Hepatic hiatal hernia, GERD-  ,  Endo/Other    Renal/GU Renal InsufficiencyRenal disease     Musculoskeletal   Abdominal   Peds  Hematology   Anesthesia Other Findings   Reproductive/Obstetrics                          Anesthesia Physical Anesthesia Plan  ASA: III  Anesthesia Plan: General   Post-op Pain Management:    Induction: Intravenous  Airway Management Planned: LMA  Additional Equipment:   Intra-op Plan:   Post-operative Plan:   Informed Consent: I have reviewed the patients History and Physical, chart, labs and discussed the procedure including the risks, benefits and alternatives for the proposed anesthesia with the patient or authorized representative who has indicated his/her understanding and acceptance.   Dental Advisory Given  Plan Discussed with: CRNA, Surgeon and Anesthesiologist  Anesthesia Plan Comments:        Anesthesia Quick Evaluation

## 2011-12-02 NOTE — Preoperative (Signed)
Beta Blockers   Reason not to administer Beta Blockers:Not Applicable 

## 2011-12-02 NOTE — H&P (Signed)
[facility] [place]  Brief History and Physical  History of Present Illness  [name] is a 51 y.o. male who presents with chief complaint: chronic kidney disease stage IV-V .  The patient presents today for placement of left arm fistula.    Past Medical History  Diagnosis Date  . ACHALASIA   . CEREBROVASCULAR ACCIDENT, HX OF   . DEGENERATIVE JOINT DISEASE, SHOULDER   . GERD   . Headache   . HYPERLIPIDEMIA   . HYPERTENSION   . RENAL CALCULUS, HX OF   . RENAL INSUFFICIENCY   . H/O hiatal hernia   . CAD (coronary artery disease), 3 vessel significant disease.  03/28/2011  . Impaired glucose tolerance 05/13/2011  . S/P PTCA (percutaneous transluminal coronary angioplasty), 06/30/11 07/01/2011  . CKD (chronic kidney disease) stage 4, GFR 15-29 ml/min 07/01/2011  . History of diastolic dysfunction, grade 2 by echo 07/03/2011  . S/P coronary artery stent placement, PTCA/DES Resolute to mid-LAD via the LIMA graft, and PTCA of apical 95% stenosis 07/05/11 07/04/2011  . NSTEMI (non-ST elevated myocardial infarction), 06/30/11 07/01/2011  . DEPRESSION   . Sleep apnea     SLEEP STUDY IN [place] , NO MACHINE YET  1 YEAR  . Stroke     93/2005/2006/2007  . Seizures     INSULIN INDUCED  . ANEMIA-NOS     Past Surgical History  Procedure Date  . Tonsillectomy   . Stomach surgery 2009    achalasia  . Knee surgery 01/07/2011    right  . Insertion of dialysis catheter 04/01/2011    Procedure: INSERTION OF DIALYSIS CATHETER;  Surgeon: Nilda Simmer, MD;  Location: [facility] OR;  Service: Vascular;  Laterality: N/A;  . Cardiac catheterization   . Coronary angioplasty     06/30/11   AT [facility]  . Coronary artery bypass graft 04/01/2011    Procedure: CORONARY ARTERY BYPASS GRAFTING (CABG);  Surgeon: Kathlee Nations Suann Larry, MD;  Location: [facility] OR;  Service: Open Heart Surgery;  Laterality: N/A;    History   Social History  . Marital Status: Married    Spouse Name: N/A   Number of Children: N/A  . Years of Education: N/A   Occupational History  . Not on file.   Social History Main Topics  . Smoking status: Never Smoker   . Smokeless tobacco: Never Used  . Alcohol Use: Yes     rarely  . Drug Use: No  . Sexually Active: Not on file   Other Topics Concern  . Not on file   Social History Narrative  . No narrative on file    Family History  Problem Relation Age of Onset  . Hypertension Other   . Stroke Other   . Hyperlipidemia Other     No current facility-administered medications on file prior to encounter.   Current Outpatient Prescriptions on File Prior to Encounter  Medication Sig Dispense Refill  . acetaminophen (TYLENOL) 325 MG tablet Take 650 mg by mouth every 6 (six) hours as needed. For pain      . amLODipine (NORVASC) 10 MG tablet Take 1 tablet (10 mg total) by mouth daily.  30 tablet  6  . aspirin EC 81 MG EC tablet Take 1 tablet (81 mg total) by mouth daily.      . cloNIDine (CATAPRES) 0.2 MG tablet Take 0.2 mg by mouth 2 (two) times daily.       . febuxostat (ULORIC) 40 MG tablet Take  40 mg by mouth 2 (two) times daily.      . hydrALAZINE (APRESOLINE) 10 MG tablet Take 10 mg by mouth 2 (two) times daily.      . isosorbide mononitrate (IMDUR) 60 MG 24 hr tablet Take 1 tablet (60 mg total) by mouth daily.  30 tablet  6  . Omega-3 Fatty Acids (FISH OIL) 1200 MG CAPS Take 1 capsule by mouth 3 (three) times daily.      . polysaccharide iron (NIFEREX) 150 MG CAPS capsule Take 150 mg by mouth daily.      . ranolazine (RANEXA) 500 MG 12 hr tablet Take 1 tablet (500 mg total) by mouth every morning.  30 tablet  6  . rosuvastatin (CRESTOR) 20 MG tablet Take 1 tablet (20 mg total) by mouth daily at 6 PM.  30 tablet  6  . terazosin (HYTRIN) 10 MG capsule Take 10 mg by mouth at bedtime.      . Ticagrelor (BRILINTA) 90 MG TABS tablet Take 1 tablet (90 mg total) by mouth 2 (two) times daily.  60 tablet  11    Allergies  Allergen Reactions  .  Shrimp (Shellfish Allergy) Shortness Of Breath  . Atorvastatin Other (See Comments)    REACTION: weakness  . Insulins Other (See Comments)    Patient unsure as to the kind of insulin but states that it has previously caused seizures. This occurred in the hospital in 2006; but in most recent hospitalization (Jan 2013) received insulin but did not have reaction  . Simvastatin Other (See Comments)    REACTION: abnormal liver tests  . Ultram (Tramadol)     Review of Systems: As listed above, otherwise negative.  Physical Examination  Filed Vitals:   12/02/11 0613  BP: 152/84  Pulse: 48  Temp: 98.2 F (36.8 C)  Resp: 18  SpO2: 100%    General: A&O x 3, WDWN  Pulmonary: Sym exp, good air movt, CTAB, no rales, rhonchi, & wheezing  Cardiac: RRR, Nl S1, S2, no Murmurs, rubs or gallops  Gastrointestinal: soft, NTND, -G/R, - HSM, - masses, - CVAT B  Musculoskeletal: M/S 5/5 throughout , Extremities without ischemic changes   Laboratory See iStat  Medical Decision Making  [name] is a 51 y.o. male who presents with: chronic kidney disease stage IV-V .   The patient is scheduled for: placement of left arm arteriovenous fistula   Risk, benefits, and alternatives to access surgery were discussed.  The patient is aware the risks include but are not limited to: bleeding, infection, steal syndrome, nerve damage, ischemic monomelic neuropathy, failure to mature, and need for additional procedures.  The patient is aware of the risks and agrees to proceed.  Leonides Sake, MD [facility] [place] Office: [phone] Pager: [phone]  12/02/2011, 7:29 AM

## 2011-12-02 NOTE — Telephone Encounter (Signed)
Message copied by Fredrich Birks on Tue Dec 02, 2011 11:10 AM ------      Message from: Melene Plan      Created: Tue Dec 02, 2011 10:20 AM                   ----- Message -----         From: Fransisco Hertz, MD         Sent: 12/02/2011   9:14 AM           To: Reuel Derby, Melene Plan, RN            LEONIDES MINDER      [id number]      Jun 19, 1960                        PROCEDURE:      left radiocephaic arteriovenous fistula placement            Asst: Lianne Cure, [facility]             Follow-up: 4 wk

## 2011-12-03 ENCOUNTER — Encounter (HOSPITAL_COMMUNITY): Payer: Self-pay | Admitting: Vascular Surgery

## 2011-12-09 ENCOUNTER — Encounter: Payer: Self-pay | Admitting: Internal Medicine

## 2011-12-09 ENCOUNTER — Ambulatory Visit (INDEPENDENT_AMBULATORY_CARE_PROVIDER_SITE_OTHER): Payer: Medicare Other | Admitting: Internal Medicine

## 2011-12-09 VITALS — BP 124/76 | HR 52 | Temp 97.2°F | Resp 16 | Wt 242.0 lb

## 2011-12-09 DIAGNOSIS — L259 Unspecified contact dermatitis, unspecified cause: Secondary | ICD-10-CM | POA: Diagnosis not present

## 2011-12-09 DIAGNOSIS — L309 Dermatitis, unspecified: Secondary | ICD-10-CM

## 2011-12-09 DIAGNOSIS — N184 Chronic kidney disease, stage 4 (severe): Secondary | ICD-10-CM

## 2011-12-09 DIAGNOSIS — I10 Essential (primary) hypertension: Secondary | ICD-10-CM | POA: Diagnosis not present

## 2011-12-09 MED ORDER — TRIAMCINOLONE ACETONIDE 0.1 % EX CREA: TOPICAL_CREAM | Freq: Two times a day (BID) | CUTANEOUS | Status: DC

## 2011-12-09 NOTE — Assessment & Plan Note (Signed)
BP Readings from Last 3 Encounters:  12/09/11 124/76  12/02/11 130/80  12/02/11 130/80   The current medical regimen is effective;  continue present plan and medications.

## 2011-12-09 NOTE — Progress Notes (Signed)
  Subjective:    Patient ID: [name], male    DOB: 1960-10-02, 51 y.o.   MRN: [id number]  Rash This is a new problem. The current episode started in the past 7 days. The problem has been gradually worsening since onset. The affected locations include the torso, right hand and right lower leg. The rash is characterized by itchiness. It is unknown if there was an exposure to a precipitant. Pertinent negatives include no anorexia, congestion, cough, diarrhea, facial edema, fatigue, fever, joint pain, nail changes, rhinorrhea, shortness of breath, sore throat or vomiting. Treatments tried: alcohol wash. The treatment provided no relief. His past medical history is significant for allergies. There is no history of asthma.      Review of Systems  Constitutional: Negative for fever and fatigue.  HENT: Negative for congestion, sore throat and rhinorrhea.   Respiratory: Negative for cough and shortness of breath.   Gastrointestinal: Negative for vomiting, diarrhea and anorexia.  Musculoskeletal: Negative for joint pain.  Skin: Positive for rash. Negative for nail changes, color change and pallor.  Neurological: Negative for dizziness and headaches.  Hematological: Negative for adenopathy. Does not bruise/bleed easily.       Objective:   Physical Exam  Constitutional: He appears well-developed and well-nourished. No distress.  Cardiovascular: Normal rate and regular rhythm.  Exam reveals no friction rub.   Pulmonary/Chest: Effort normal and breath sounds normal. No respiratory distress. He has no wheezes. He exhibits no tenderness.  Skin: Skin is warm and dry. Rash noted.       Tiny clear vesicles and confluent mild erythema webspace of R hand fingers - palms spared - evidence for excoriation on hand and R shin and R torso - no weeping   Lab Results  Component Value Date   WBC 7.1 07/07/2011   HGB 10.5* 12/02/2011   HCT 31.0* 12/02/2011   PLT 260 07/07/2011   GLUCOSE 116* 12/02/2011   CHOL 107 07/01/2011   TRIG 124 07/01/2011   HDL 29* 07/01/2011   LDLDIRECT 129.4 06/23/2008   LDLCALC 53 07/01/2011   ALT 19 07/02/2011   AST 15 07/02/2011   NA 142 12/02/2011   K 3.8 12/02/2011   CL 105 07/07/2011   CREATININE 4.21* 07/07/2011   BUN 35* 07/07/2011   CO2 20 07/07/2011   TSH 0.414 06/30/2011   PSA 2.14 11/11/2011   INR 1.11 12/02/2011   HGBA1C 6.3* 06/30/2011        Assessment & Plan:  Eczema, hands - no other history to support contact reaction or viral infection Triamcinolone for symptomatic relief Education provided  Also See problem list. Medications and labs reviewed today.

## 2011-12-09 NOTE — Patient Instructions (Signed)
It was good to see you today. We have reviewed your prior records including labs and tests today For your itching rash, use triamcinolone twice a day as needed - Your prescription(s) have been submitted to your pharmacy. Please take as directed and contact our office if you believe you are having problem(s) with the medication(s). Do not washed with alcohol or anything to dry skin -okay to moisturize with Gold Bond lotion if needed

## 2011-12-09 NOTE — Assessment & Plan Note (Signed)
S/p distal LUE fistula creation for HD plans -  Good thrill, incision healing well without evidence of infection

## 2011-12-20 HISTORY — PX: SUBCLAVIAN STENT PLACEMENT: SUR1038

## 2011-12-22 ENCOUNTER — Other Ambulatory Visit: Payer: Self-pay | Admitting: Internal Medicine

## 2011-12-22 ENCOUNTER — Telehealth: Payer: Self-pay | Admitting: Internal Medicine

## 2011-12-22 ENCOUNTER — Encounter: Payer: Self-pay | Admitting: Internal Medicine

## 2011-12-22 ENCOUNTER — Ambulatory Visit (INDEPENDENT_AMBULATORY_CARE_PROVIDER_SITE_OTHER): Payer: Medicare Other | Admitting: Internal Medicine

## 2011-12-22 VITALS — BP 138/80 | HR 60 | Temp 98.1°F

## 2011-12-22 DIAGNOSIS — I10 Essential (primary) hypertension: Secondary | ICD-10-CM | POA: Diagnosis not present

## 2011-12-22 DIAGNOSIS — L259 Unspecified contact dermatitis, unspecified cause: Secondary | ICD-10-CM

## 2011-12-22 DIAGNOSIS — N184 Chronic kidney disease, stage 4 (severe): Secondary | ICD-10-CM | POA: Diagnosis not present

## 2011-12-22 DIAGNOSIS — L309 Dermatitis, unspecified: Secondary | ICD-10-CM

## 2011-12-22 MED ORDER — TRIAMCINOLONE ACETONIDE 0.5 % EX OINT: TOPICAL_OINTMENT | Freq: Two times a day (BID) | CUTANEOUS | Status: DC

## 2011-12-22 MED ORDER — METHYLPREDNISOLONE ACETATE 80 MG/ML IJ SUSP
120.0000 mg | Freq: Once | INTRAMUSCULAR | Status: AC
  Administered 2011-12-22: 120 mg via INTRAMUSCULAR

## 2011-12-22 MED ORDER — HYDROXYZINE HCL 10 MG PO TABS: 10.0000 mg | ORAL_TABLET | Freq: Three times a day (TID) | ORAL | Status: DC | PRN

## 2011-12-22 NOTE — Patient Instructions (Signed)
It was good to see you today. We have reviewed your prior records including labs and tests today For your itching, we gave you a steroid shot today Also use stronger dose triamcinolone twice a day as needed and Atarax at bedtime or as needed for itch symptoms -  Your prescription(s) have been submitted to your pharmacy. Please take as directed and contact our office if you believe you are having problem(s) with the medication(s). Do not washed with alcohol or anything to dry skin -okay to moisturize with Gold Bond lotion if needed

## 2011-12-22 NOTE — Assessment & Plan Note (Signed)
S/p distal LUE fistula creation 11/2011 for HD plans -  Good thrill, incision healing well without evidence of infection

## 2011-12-22 NOTE — Progress Notes (Signed)
Subjective:    Patient ID: [name], male    DOB: August 18, 1960, 51 y.o.   MRN: [id number]  Rash This is a recurrent (rash improved but increasing itch - awoke from sleep bleeding on R thigh from scratching) problem. The current episode started in the past 7 days. The problem has been waxing and waning since onset. The affected locations include the torso, right hand and left arm. The rash is characterized by itchiness. It is unknown if there was an exposure to a precipitant. Pertinent negatives include no anorexia, congestion, cough, diarrhea, facial edema, fatigue, fever, joint pain, nail changes, rhinorrhea, shortness of breath, sore throat or vomiting. Treatments tried: alcohol wash. The treatment provided no relief. His past medical history is significant for allergies. There is no history of asthma.   Past Medical History  Diagnosis Date  . ACHALASIA   . CEREBROVASCULAR ACCIDENT, HX OF   . DEGENERATIVE JOINT DISEASE, SHOULDER   . GERD   . Headache   . HYPERLIPIDEMIA   . HYPERTENSION   . RENAL CALCULUS, HX OF   . RENAL INSUFFICIENCY   . H/O hiatal hernia   . CAD (coronary artery disease), 3 vessel significant disease.    . Impaired glucose tolerance   . S/P PTCA (percutaneous transluminal coronary angioplasty), 06/30/11 07/01/2011  . CKD (chronic kidney disease) stage 4, GFR 15-29 ml/min   . History of diastolic dysfunction, grade 2 by echo   . S/P coronary artery stent placement, PTCA/DES Resolute to mid-LAD via the LIMA graft, and PTCA of apical 95% stenosis 07/05/11 07/04/2011  . NSTEMI (non-ST elevated myocardial infarction), 06/30/11 07/01/2011  . DEPRESSION   . Sleep apnea     SLEEP STUDY IN [place] , NO MACHINE YET  1 YEAR  . Stroke     93/2005/2006/2007  . Seizures     INSULIN INDUCED  . ANEMIA-NOS     Review of Systems  Constitutional: Negative for fever and fatigue.  HENT: Negative for congestion, sore throat and rhinorrhea.   Respiratory: Negative for cough and  shortness of breath.   Gastrointestinal: Negative for vomiting, diarrhea and anorexia.  Musculoskeletal: Negative for joint pain.  Skin: Positive for rash. Negative for nail changes, color change and pallor.  Neurological: Negative for dizziness and headaches.  Hematological: Negative for adenopathy. Does not bruise/bleed easily.       Objective:   Physical Exam  Constitutional: He appears well-developed and well-nourished. No distress.  Cardiovascular: Normal rate and regular rhythm.  Exam reveals no friction rub.   Pulmonary/Chest: Effort normal and breath sounds normal. No respiratory distress. He has no wheezes. He exhibits no tenderness.  Skin: Skin is warm and dry. Rash noted.       Resolution of tiny clear vesicles and erythema - evidence for excoriation on hands, L forearm, B flanks and torso - no ulceration, blistering or weeping   Lab Results  Component Value Date   WBC 7.1 07/07/2011   HGB 10.5* 12/02/2011   HCT 31.0* 12/02/2011   PLT 260 07/07/2011   GLUCOSE 116* 12/02/2011   CHOL 107 07/01/2011   TRIG 124 07/01/2011   HDL 29* 07/01/2011   LDLDIRECT 129.4 06/23/2008   LDLCALC 53 07/01/2011   ALT 19 07/02/2011   AST 15 07/02/2011   NA 142 12/02/2011   K 3.8 12/02/2011   CL 105 07/07/2011   CREATININE 4.21* 07/07/2011   BUN 35* 07/07/2011   CO2 20 07/07/2011   TSH 0.414 06/30/2011   PSA 2.14  11/11/2011   INR 1.11 12/02/2011   HGBA1C 6.3* 06/30/2011        Assessment & Plan:  Eczema - hands improved with triamcinolone for symptomatic relief, but not resolved Now with inner thighs and evidence of excoriation Itching related to same  IM medrol today Increase potency topical steroid, new electronic prescription provided Also use Atarax for symptomatic control each bedtime and when necessary Education provided  Also see problem list. Medications and labs reviewed today.

## 2011-12-22 NOTE — Assessment & Plan Note (Signed)
BP Readings from Last 3 Encounters:  12/22/11 138/80  12/09/11 124/76  12/02/11 130/80   The current medical regimen is effective;  continue present plan and medications.

## 2011-12-22 NOTE — Telephone Encounter (Signed)
Caller: [name]/Patient; Patient Name: [name]; PCP: Oliver Barre (Adults only); Best Callback Phone Number: [phone]; Call regarding Ezcema flare-up not responding to treatment.  Pt seen on 9-24, Triamcinolone give, per Pt Rash is spreading to Abdomen, Back, Thighs, itching is worse, scratching until it bleeds.  All emergent symptoms ruled out per Rash protocol, see in 4 hours due to symptoms worsening after recommended treatment plan.  Appointment scheduled with Dr Felicity Coyer at 1615 on 10-7.  Pt verbalized understanding.

## 2012-01-01 ENCOUNTER — Encounter: Payer: Self-pay | Admitting: Vascular Surgery

## 2012-01-02 ENCOUNTER — Ambulatory Visit (INDEPENDENT_AMBULATORY_CARE_PROVIDER_SITE_OTHER): Payer: Medicare Other | Admitting: Vascular Surgery

## 2012-01-02 ENCOUNTER — Encounter: Payer: Self-pay | Admitting: Vascular Surgery

## 2012-01-02 VITALS — BP 154/88 | HR 59 | Ht 72.0 in | Wt 240.0 lb

## 2012-01-02 DIAGNOSIS — N186 End stage renal disease: Secondary | ICD-10-CM | POA: Insufficient documentation

## 2012-01-02 DIAGNOSIS — Z4931 Encounter for adequacy testing for hemodialysis: Secondary | ICD-10-CM

## 2012-01-02 DIAGNOSIS — N184 Chronic kidney disease, stage 4 (severe): Secondary | ICD-10-CM

## 2012-01-02 NOTE — Progress Notes (Signed)
[facility]  Postoperative Access Visit  History of Present Illness  [name] is a 51 y.o. year old male who presents for postoperative follow-up for: L RC AVF  (Date: 12/02/11).  The patient's wounds are healed.  The patient notes no steal symptoms.  The patient is able to complete their activities of daily living.  The patient's current symptoms are: mild numbess in thumb.  Physical Examination  Filed Vitals:   01/02/12 1001  BP: 154/88  Pulse: 59   LUE: Incision is healed, skin feels warm, hand grip is 5/5, sensation in digits is intact, palpable thrill, bruit can be auscultated , difficult to feel bruit in proximal segment  Medical Decision Making  [name] is a 51 y.o. year old male who presents s/p L RC AVF.  Follow in one month for formal access duplex to check maturation, depth and side branches  Thank you for allowing [place] to participate in this patient's care.  Leonides Sake, MD [facility] Office: [phone] Pager: [phone]

## 2012-01-05 NOTE — Addendum Note (Signed)
Addended by: Sharee Pimple on: 01/05/2012 08:09 AM   Modules accepted: Orders

## 2012-01-06 ENCOUNTER — Other Ambulatory Visit: Payer: Self-pay | Admitting: Internal Medicine

## 2012-01-28 ENCOUNTER — Ambulatory Visit: Payer: Medicare Other | Admitting: Internal Medicine

## 2012-01-28 ENCOUNTER — Telehealth: Payer: Self-pay | Admitting: Internal Medicine

## 2012-01-28 ENCOUNTER — Ambulatory Visit (INDEPENDENT_AMBULATORY_CARE_PROVIDER_SITE_OTHER): Payer: Medicare Other | Admitting: Internal Medicine

## 2012-01-28 ENCOUNTER — Encounter: Payer: Self-pay | Admitting: Internal Medicine

## 2012-01-28 VITALS — BP 122/80 | HR 62 | Temp 97.3°F | Ht 73.0 in | Wt 238.5 lb

## 2012-01-28 DIAGNOSIS — R7309 Other abnormal glucose: Secondary | ICD-10-CM | POA: Diagnosis not present

## 2012-01-28 DIAGNOSIS — R7302 Impaired glucose tolerance (oral): Secondary | ICD-10-CM

## 2012-01-28 DIAGNOSIS — I10 Essential (primary) hypertension: Secondary | ICD-10-CM | POA: Diagnosis not present

## 2012-01-28 DIAGNOSIS — L509 Urticaria, unspecified: Secondary | ICD-10-CM | POA: Diagnosis not present

## 2012-01-28 MED ORDER — METHYLPREDNISOLONE ACETATE 80 MG/ML IJ SUSP
120.0000 mg | Freq: Once | INTRAMUSCULAR | Status: AC
  Administered 2012-01-28: 120 mg via INTRAMUSCULAR

## 2012-01-28 MED ORDER — GLUCOSE BLOOD VI STRP: ORAL_STRIP | Status: DC

## 2012-01-28 MED ORDER — PREDNISONE 10 MG PO TABS: ORAL_TABLET | ORAL | Status: DC

## 2012-01-28 MED ORDER — TRIAMCINOLONE ACETONIDE 0.1 % EX CREA: TOPICAL_CREAM | Freq: Two times a day (BID) | CUTANEOUS | Status: DC

## 2012-01-28 NOTE — Assessment & Plan Note (Signed)
stable overall by hx and exam, most recent data reviewed with pt, and pt to continue medical treatment as before - none for now, but gave glucometer and rx for supplies to check cbg before dinner while on steroid tx to gauge for uncontrolled sugar, pt to call for > 200

## 2012-01-28 NOTE — Progress Notes (Signed)
Subjective:    Patient ID: [name], male    DOB: 07-11-1960, 51 y.o.   MRN: [id number]  HPI  Here with wife present, with 2-3 wks mild to mod new onset hive like rash to the torso, and extremities and marked pruritis with several excoriations as well;  No cellullitis with red/tender and no tongue,lip,throat swelling.  Not on ACE or ARB with his med regimen, o/w no other typical meds or new OTC med use.  Has not tried benadryl.  Pt denies chest pain, increased sob or doe, wheezing, orthopnea, PND, increased LE swelling, palpitations, dizziness or syncope.  Pt denies new neurological symptoms such as new headache, or facial or extremity weakness or numbness   Pt denies polydipsia, polyuria.   Pt denies fever, wt loss, night sweats, loss of appetite, or other constitutional symptoms Past Medical History  Diagnosis Date  . ACHALASIA   . CEREBROVASCULAR ACCIDENT, HX OF   . DEGENERATIVE JOINT DISEASE, SHOULDER   . GERD   . Headache   . HYPERLIPIDEMIA   . HYPERTENSION   . RENAL CALCULUS, HX OF   . RENAL INSUFFICIENCY   . H/O hiatal hernia   . CAD (coronary artery disease), 3 vessel significant disease.    . Impaired glucose tolerance   . S/P PTCA (percutaneous transluminal coronary angioplasty), 06/30/11 07/01/2011  . CKD (chronic kidney disease) stage 4, GFR 15-29 ml/min   . History of diastolic dysfunction, grade 2 by echo   . S/P coronary artery stent placement, PTCA/DES Resolute to mid-LAD via the LIMA graft, and PTCA of apical 95% stenosis 07/05/11 07/04/2011  . NSTEMI (non-ST elevated myocardial infarction), 06/30/11 07/01/2011  . DEPRESSION   . Sleep apnea     SLEEP STUDY IN [place] , NO MACHINE YET  1 YEAR  . Stroke     93/2005/2006/2007  . Seizures     INSULIN INDUCED  . ANEMIA-NOS    Past Surgical History  Procedure Date  . Tonsillectomy   . Stomach surgery 2009    achalasia  . Knee surgery 01/07/2011    right  . Insertion of dialysis catheter 04/01/2011    Procedure:  INSERTION OF DIALYSIS CATHETER;  Surgeon: Nilda Simmer, MD;  Location: [facility] OR;  Service: Vascular;  Laterality: N/A;  . Cardiac catheterization   . Coronary angioplasty     06/30/11   AT [facility]  . Coronary artery bypass graft 04/01/2011    Procedure: CORONARY ARTERY BYPASS GRAFTING (CABG);  Surgeon: Kathlee Nations Suann Larry, MD;  Location: [facility] OR;  Service: Open Heart Surgery;  Laterality: N/A;  . Av fistula placement 12/02/2011    Procedure: ARTERIOVENOUS (AV) FISTULA CREATION;  Surgeon: Fransisco Hertz, MD;  Location: [facility] OR;  Service: Vascular;  Laterality: Left;  Ultrasound Guided    reports that he has never smoked. He has never used smokeless tobacco. He reports that he drinks alcohol. He reports that he does not use illicit drugs. family history includes Hyperlipidemia in his other; Hypertension in his other; and Stroke in his other. Allergies  Allergen Reactions  . Shrimp (Shellfish Allergy) Shortness Of Breath  . Atorvastatin Other (See Comments)    REACTION: weakness  . Insulins Other (See Comments)    Patient unsure as to the kind of insulin but states that it has previously caused seizures. This occurred in the hospital in 2006; but in most recent hospitalization (Jan 2013) received insulin but did not have reaction  . Simvastatin Other (See Comments)  REACTION: abnormal liver tests  . Ultram (Tramadol)    Current Outpatient Prescriptions on File Prior to Visit  Medication Sig Dispense Refill  . acetaminophen (TYLENOL) 325 MG tablet Take 650 mg by mouth every 6 (six) hours as needed. For pain      . amLODipine (NORVASC) 10 MG tablet Take 1 tablet (10 mg total) by mouth daily.  30 tablet  6  . aspirin EC 81 MG EC tablet Take 1 tablet (81 mg total) by mouth daily.      . cloNIDine (CATAPRES) 0.2 MG tablet Take 0.2 mg by mouth 2 (two) times daily.       . febuxostat (ULORIC) 40 MG tablet Take 40 mg by mouth 2 (two) times daily.      . furosemide (LASIX) 40 MG tablet Take 40 mg by mouth  daily.      . hydrALAZINE (APRESOLINE) 10 MG tablet Take 10 mg by mouth 2 (two) times daily.      . hydrOXYzine (ATARAX/VISTARIL) 10 MG tablet TAKE 1 TABLET BY MOUTH 3 TIMES DAILY AS NEEDED FORITCHING  30 tablet  0  . isosorbide mononitrate (IMDUR) 60 MG 24 hr tablet Take 1 tablet (60 mg total) by mouth daily.  30 tablet  6  . metoprolol (LOPRESSOR) 50 MG tablet Take 50 mg by mouth 2 (two) times daily.      . Omega-3 Fatty Acids (FISH OIL) 1200 MG CAPS Take 1 capsule by mouth 3 (three) times daily.      Marland Kitchen oxyCODONE (ROXICODONE) 5 MG immediate release tablet Take 1 tablet (5 mg total) by mouth every 4 (four) hours as needed for pain.  30 tablet  0  . polysaccharide iron (NIFEREX) 150 MG CAPS capsule Take 150 mg by mouth daily.      . ranolazine (RANEXA) 500 MG 12 hr tablet Take 1 tablet (500 mg total) by mouth every morning.  30 tablet  6  . rosuvastatin (CRESTOR) 20 MG tablet Take 1 tablet (20 mg total) by mouth daily at 6 PM.  30 tablet  6  . terazosin (HYTRIN) 10 MG capsule Take 10 mg by mouth at bedtime.      . Ticagrelor (BRILINTA) 90 MG TABS tablet Take 1 tablet (90 mg total) by mouth 2 (two) times daily.  60 tablet  11  . triamcinolone ointment (KENALOG) 0.5 % Apply topically 2 (two) times daily.  30 g  0   No current facility-administered medications on file prior to visit.   Review of Systems  Constitutional: Negative for diaphoresis and unexpected weight change.  HENT: Negative for tinnitus.   Eyes: Negative for photophobia and visual disturbance.  Respiratory: Negative for choking and stridor.   Gastrointestinal: Negative for vomiting and blood in stool.  Genitourinary: Negative for hematuria and decreased urine volume.  Musculoskeletal: Negative for gait problem.  Skin: Negative for color change and wound.  Neurological: Negative for tremors and numbness.  Psychiatric/Behavioral: Negative for decreased concentration. The patient is not hyperactive.       Objective:   Physical  Exam BP 122/80  Pulse 62  Temp 97.3 F (36.3 C) (Oral)  Ht 6\' 1"  (1.854 m)  Wt 238 lb 8 oz (108.183 kg)  BMI 31.47 kg/m2  SpO2 97% Physical Exam  VS noted Constitutional: Pt appears well-developed and well-nourished.  HENT: Head: Normocephalic.  Right Ear: External ear normal.  Left Ear: External ear normal.  Eyes: Conjunctivae and EOM are normal. Pupils are equal, round, and reactive to  light.  Neck: Normal range of motion. Neck supple.  Cardiovascular: Normal rate and regular rhythm.   Pulmonary/Chest: Effort normal and breath sounds normal.  Abd:  Soft, NT, non-distended, + BS Neurological: Pt is alert. Not confused  Skin: Skin is warm. No erythema. except for diffuse torso and more prox than distal extremity rash, small erythem areas, with sone blanching Psychiatric: Pt behavior is normal. Thought content normal.     Assessment & Plan:

## 2012-01-28 NOTE — Telephone Encounter (Signed)
Caller: [name]/Patient; Patient Name: [name]; PCP: Oliver Barre (Adults only); Best Callback Phone Number: [phone].  Patient calling about Rx for cream.  Seen in office 01/28/12 for itchy rash, and given steroid injection in office, started on course of steroids, and given Rx for One-Touch strips.  Per Epic, no Rx for cream seen; info to office for provider review/Rx/callback.   Uses [facility].   May reach patient at [phone].

## 2012-01-28 NOTE — Assessment & Plan Note (Signed)
Mild to mod, for depomedrol IM, and predpack, to f/u any worsening symptoms or concerns 

## 2012-01-28 NOTE — Telephone Encounter (Signed)
Done erx 

## 2012-01-28 NOTE — Patient Instructions (Addendum)
You had the steroid shot today Take all new medications as prescribed - the prednisone (sent to your pharmacy) Continue all other medications as before While taking the prednisone, please check your sugars daily before dinner time; please call if your readings seems consistently over 200  Please go to LAB in the Basement for the blood and/or urine tests to be done today - just the A1c test today for sugar You will be contacted by phone if any changes need to be made immediately.  Otherwise, you will receive a letter about your results with an explanation. Please remember to sign up for My Chart at your earliest convenience, as this will be important to you in the future with finding out test results. Please keep your appointments with your specialists as you have planned

## 2012-01-28 NOTE — Assessment & Plan Note (Signed)
stable overall by hx and exam, most recent data reviewed with pt, and pt to continue medical treatment as before 

## 2012-01-30 ENCOUNTER — Ambulatory Visit: Payer: Medicare Other | Admitting: Vascular Surgery

## 2012-02-03 ENCOUNTER — Emergency Department (HOSPITAL_COMMUNITY): Payer: Medicare Other

## 2012-02-03 ENCOUNTER — Encounter (HOSPITAL_COMMUNITY): Payer: Self-pay | Admitting: *Deleted

## 2012-02-03 ENCOUNTER — Emergency Department (HOSPITAL_COMMUNITY)
Admission: EM | Admit: 2012-02-03 | Discharge: 2012-02-03 | Disposition: A | Discharge status: Home / Self Care | Payer: Medicare Other | Attending: Emergency Medicine | Admitting: Emergency Medicine

## 2012-02-03 DIAGNOSIS — M6281 Muscle weakness (generalized): Secondary | ICD-10-CM | POA: Insufficient documentation

## 2012-02-03 DIAGNOSIS — Z79899 Other long term (current) drug therapy: Secondary | ICD-10-CM | POA: Diagnosis not present

## 2012-02-03 DIAGNOSIS — Z8673 Personal history of transient ischemic attack (TIA), and cerebral infarction without residual deficits: Secondary | ICD-10-CM | POA: Diagnosis not present

## 2012-02-03 DIAGNOSIS — E785 Hyperlipidemia, unspecified: Secondary | ICD-10-CM | POA: Diagnosis not present

## 2012-02-03 DIAGNOSIS — N184 Chronic kidney disease, stage 4 (severe): Secondary | ICD-10-CM | POA: Diagnosis not present

## 2012-02-03 DIAGNOSIS — Z9861 Coronary angioplasty status: Secondary | ICD-10-CM | POA: Insufficient documentation

## 2012-02-03 DIAGNOSIS — I2510 Atherosclerotic heart disease of native coronary artery without angina pectoris: Secondary | ICD-10-CM | POA: Diagnosis not present

## 2012-02-03 DIAGNOSIS — Z87442 Personal history of urinary calculi: Secondary | ICD-10-CM | POA: Insufficient documentation

## 2012-02-03 DIAGNOSIS — Z7982 Long term (current) use of aspirin: Secondary | ICD-10-CM | POA: Diagnosis not present

## 2012-02-03 DIAGNOSIS — F329 Major depressive disorder, single episode, unspecified: Secondary | ICD-10-CM | POA: Diagnosis not present

## 2012-02-03 DIAGNOSIS — Z87448 Personal history of other diseases of urinary system: Secondary | ICD-10-CM | POA: Diagnosis not present

## 2012-02-03 DIAGNOSIS — Z862 Personal history of diseases of the blood and blood-forming organs and certain disorders involving the immune mechanism: Secondary | ICD-10-CM | POA: Diagnosis not present

## 2012-02-03 DIAGNOSIS — K219 Gastro-esophageal reflux disease without esophagitis: Secondary | ICD-10-CM | POA: Insufficient documentation

## 2012-02-03 DIAGNOSIS — I129 Hypertensive chronic kidney disease with stage 1 through stage 4 chronic kidney disease, or unspecified chronic kidney disease: Secondary | ICD-10-CM | POA: Insufficient documentation

## 2012-02-03 DIAGNOSIS — Z8669 Personal history of other diseases of the nervous system and sense organs: Secondary | ICD-10-CM | POA: Diagnosis not present

## 2012-02-03 DIAGNOSIS — E119 Type 2 diabetes mellitus without complications: Secondary | ICD-10-CM | POA: Diagnosis not present

## 2012-02-03 DIAGNOSIS — I252 Old myocardial infarction: Secondary | ICD-10-CM | POA: Diagnosis not present

## 2012-02-03 DIAGNOSIS — Z951 Presence of aortocoronary bypass graft: Secondary | ICD-10-CM | POA: Insufficient documentation

## 2012-02-03 DIAGNOSIS — R29898 Other symptoms and signs involving the musculoskeletal system: Secondary | ICD-10-CM

## 2012-02-03 DIAGNOSIS — Z8739 Personal history of other diseases of the musculoskeletal system and connective tissue: Secondary | ICD-10-CM | POA: Diagnosis not present

## 2012-02-03 DIAGNOSIS — R7309 Other abnormal glucose: Secondary | ICD-10-CM | POA: Diagnosis not present

## 2012-02-03 DIAGNOSIS — R5381 Other malaise: Secondary | ICD-10-CM | POA: Diagnosis not present

## 2012-02-03 DIAGNOSIS — F3289 Other specified depressive episodes: Secondary | ICD-10-CM | POA: Insufficient documentation

## 2012-02-03 DIAGNOSIS — R739 Hyperglycemia, unspecified: Secondary | ICD-10-CM

## 2012-02-03 LAB — CBC WITH DIFFERENTIAL/PLATELET
Basophils Absolute: 0 10*3/uL (ref 0.0–0.1)
Eosinophils Relative: 0 % (ref 0–5)
HCT: 36.8 % — ABNORMAL LOW (ref 39.0–52.0)
Lymphocytes Relative: 6 % — ABNORMAL LOW (ref 12–46)
MCV: 94.8 fL (ref 78.0–100.0)
Monocytes Absolute: 0.9 10*3/uL (ref 0.1–1.0)
RDW: 13.3 % (ref 11.5–15.5)
WBC: 10.3 10*3/uL (ref 4.0–10.5)

## 2012-02-03 LAB — URINALYSIS, ROUTINE W REFLEX MICROSCOPIC
Glucose, UA: 100 mg/dL — AB
Hgb urine dipstick: NEGATIVE
Leukocytes, UA: NEGATIVE
Specific Gravity, Urine: 1.01 (ref 1.005–1.030)

## 2012-02-03 LAB — BASIC METABOLIC PANEL
CO2: 23 mEq/L (ref 19–32)
Calcium: 9.2 mg/dL (ref 8.4–10.5)
Creatinine, Ser: 3.95 mg/dL — ABNORMAL HIGH (ref 0.50–1.35)
Glucose, Bld: 232 mg/dL — ABNORMAL HIGH (ref 70–99)

## 2012-02-03 LAB — URINE MICROSCOPIC-ADD ON

## 2012-02-03 LAB — POCT I-STAT TROPONIN I: Troponin i, poc: 0.04 ng/mL (ref 0.00–0.08)

## 2012-02-03 MED ORDER — SODIUM CHLORIDE 0.9 % IV BOLUS (SEPSIS): 500.0000 mL | Freq: Once | INTRAVENOUS | Status: DC

## 2012-02-03 NOTE — ED Provider Notes (Signed)
History     CSN: [id number]  Arrival date & time 02/03/12  1218   First MD Initiated Contact with Patient 02/03/12 1256      No chief complaint on file. Leg weakness  (Consider location/radiation/quality/duration/timing/severity/associated sxs/prior treatment) HPI 51 yo male presents with complaints of left-sided weakness that occurred suddenly yesterday. Patient states he was at home went to stand up and had his legs give out. He fell on his carpet. He did not hit his head or sustain any other injuries. 2 other times this day he almost fell because his legs buckling out was able to catch himself with a chair. This made him concerned that he may have had another stroke who presented to the emergency department. Patient arrives without any acute complaint. He had a pain level 0/10. He states that his problems made worse by ambulation and standing and made better by sitting down. Associated symptoms include weakness in the other leg as well the patient stated was more pronounced in his left leg. Patient denies any recent loss of consciousness. He denies any fever illness. Denies any chest pain or shortness of breath. Is received no prior treatment in the past 2 days.  Past Medical History  Diagnosis Date  . ACHALASIA   . CEREBROVASCULAR ACCIDENT, HX OF   . DEGENERATIVE JOINT DISEASE, SHOULDER   . GERD   . Headache   . HYPERLIPIDEMIA   . HYPERTENSION   . RENAL CALCULUS, HX OF   . RENAL INSUFFICIENCY   . H/O hiatal hernia   . CAD (coronary artery disease), 3 vessel significant disease.    . Impaired glucose tolerance   . S/P PTCA (percutaneous transluminal coronary angioplasty), 06/30/11 07/01/2011  . CKD (chronic kidney disease) stage 4, GFR 15-29 ml/min   . History of diastolic dysfunction, grade 2 by echo   . S/P coronary artery stent placement, PTCA/DES Resolute to mid-LAD via the LIMA graft, and PTCA of apical 95% stenosis 07/05/11 07/04/2011  . NSTEMI (non-ST elevated myocardial  infarction), 06/30/11 07/01/2011  . DEPRESSION   . Sleep apnea     SLEEP STUDY IN [place] , NO MACHINE YET  1 YEAR  . Stroke     93/2005/2006/2007  . Seizures     INSULIN INDUCED  . ANEMIA-NOS     Past Surgical History  Procedure Date  . Tonsillectomy   . Stomach surgery 2009    achalasia  . Knee surgery 01/07/2011    right  . Insertion of dialysis catheter 04/01/2011    Procedure: INSERTION OF DIALYSIS CATHETER;  Surgeon: Nilda Simmer, MD;  Location: [facility];  Service: Vascular;  Laterality: N/A;  . Cardiac catheterization   . Coronary angioplasty     06/30/11   AT [facility]  . Coronary artery bypass graft 04/01/2011    Procedure: CORONARY ARTERY BYPASS GRAFTING (CABG);  Surgeon: Kathlee Nations Suann Larry, MD;  Location: [facility] OR;  Service: Open Heart Surgery;  Laterality: N/A;  . Av fistula placement 12/02/2011    Procedure: ARTERIOVENOUS (AV) FISTULA CREATION;  Surgeon: Fransisco Hertz, MD;  Location: [facility] OR;  Service: Vascular;  Laterality: Left;  Ultrasound Guided    Family History  Problem Relation Age of Onset  . Hypertension Other   . Stroke Other   . Hyperlipidemia Other     History  Substance Use Topics  . Smoking status: Never Smoker   . Smokeless tobacco: Never Used  . Alcohol Use: Yes     Comment: rarely  Review of Systems Constitutional: Negative for fever.  Eyes: Negative for vision loss.  ENT: Negative for difficulty swallowing.  Cardiovascular: Negative for chest pain. Respiratory: Negative for respiratory distress.  Gastrointestinal:  Negative for vomiting.  Genitourinary: Negative for inability to void.  Musculoskeletal: Negative for back pain.  Integumentary: Negative for rash.  Neurological: Negative for altered mental status.    Allergies  Shrimp; Atorvastatin; Insulins; Simvastatin; and Ultram  Home Medications   Current Outpatient Rx  Name  Route  Sig  Dispense  Refill  . ACETAMINOPHEN 325 MG PO TABS   Oral   Take 650 mg by mouth every 6  (six) hours as needed. For pain         . AMLODIPINE BESYLATE 10 MG PO TABS   Oral   Take 1 tablet (10 mg total) by mouth daily.   30 tablet   6   . ASPIRIN 81 MG PO TBEC   Oral   Take 1 tablet (81 mg total) by mouth daily.         Marland Kitchen CLONIDINE HCL 0.2 MG PO TABS   Oral   Take 0.2 mg by mouth 2 (two) times daily.          . FEBUXOSTAT 40 MG PO TABS   Oral   Take 40 mg by mouth 2 (two) times daily.         . FUROSEMIDE 40 MG PO TABS   Oral   Take 40 mg by mouth daily.         Marland Kitchen HYDRALAZINE HCL 10 MG PO TABS   Oral   Take 10 mg by mouth 2 (two) times daily.         Marland Kitchen HYDROXYZINE HCL 10 MG PO TABS   Oral   Take 10 mg by mouth 3 (three) times daily as needed. For itching         . ISOSORBIDE MONONITRATE ER 60 MG PO TB24   Oral   Take 1 tablet (60 mg total) by mouth daily.   30 tablet   6   . METOPROLOL TARTRATE 50 MG PO TABS   Oral   Take 50 mg by mouth 2 (two) times daily.         Marland Kitchen FISH OIL 1200 MG PO CAPS   Oral   Take 1 capsule by mouth 3 (three) times daily.         Marland Kitchen POLYSACCHARIDE IRON 150 MG PO CAPS   Oral   Take 150 mg by mouth daily.         Marland Kitchen PREDNISONE 10 MG PO TABS      3 tabs by mouth per day for 3 days,2tabs per day for 3 days,1tab per day for 3 days   18 tablet   0   . RANOLAZINE ER 500 MG PO TB12   Oral   Take 1 tablet (500 mg total) by mouth every morning.   30 tablet   6   . ROSUVASTATIN CALCIUM 20 MG PO TABS   Oral   Take 1 tablet (20 mg total) by mouth daily at 6 PM.   30 tablet   6   . TERAZOSIN HCL 10 MG PO CAPS   Oral   Take 10 mg by mouth at bedtime.         Marland Kitchen TICAGRELOR 90 MG PO TABS   Oral   Take 1 tablet (90 mg total) by mouth 2 (two) times daily.   60 tablet  11   . TRIAMCINOLONE ACETONIDE 0.1 % EX CREA   Topical   Apply topically 2 (two) times daily.   45 g   1   . GLUCOSE BLOOD VI STRP      Use as instructed   100 each   12     BP 160/94  Pulse 64  Temp 96.3 F (35.7 C)  (Oral)  Resp 18  SpO2 98%  Physical Exam Nursing note and vitals reviewed.  Constitutional: Pt is alert and appears stated age. Eyes: No injection, no scleral icterus. HENT: Atraumatic, airway open without erythema or exudate.  Respiratory: No respiratory distress. Equal breathing bilaterally. Cardiovascular: Normal rate. Extremities warm and well perfused.  Abdomen: Soft, non-tender. MSK: Extremities are atraumatic without deformity. Skin: No rash, no wounds.   Neuro: No motor nor sensory deficit. GCS 15. Normal coordination. Very slight decrease in strength on left side. Patient able to stand without difficulty. Also able to walk without difficulty. Cranial nerves II through XII grossly intact.     ED Course  Procedures (including critical care time)  Labs Reviewed  URINALYSIS, ROUTINE W REFLEX MICROSCOPIC - Abnormal; Notable for the following:    Glucose, UA 100 (*)     Protein, ur 30 (*)     All other components within normal limits  CBC WITH DIFFERENTIAL - Abnormal; Notable for the following:    RBC 3.88 (*)     Hemoglobin 12.4 (*)     HCT 36.8 (*)     Neutrophils Relative 85 (*)     Neutro Abs 8.7 (*)     Lymphocytes Relative 6 (*)     Lymphs Abs 0.6 (*)     All other components within normal limits  BASIC METABOLIC PANEL - Abnormal; Notable for the following:    Sodium 131 (*)     Glucose, Bld 232 (*)     BUN 62 (*)     Creatinine, Ser 3.95 (*)     GFR calc non Af Amer 16 (*)     GFR calc Af Amer 19 (*)     All other components within normal limits  POCT I-STAT TROPONIN I  URINE MICROSCOPIC-ADD ON   Ct Head Wo Contrast  02/03/2012  *RADIOLOGY REPORT*  Clinical Data: Left leg weakness.  CT HEAD WITHOUT CONTRAST  Technique:  Contiguous axial images were obtained from the base of the skull through the vertex without contrast.  Comparison: June 30, 2005.  Findings: Bony calvarium appears to be intact.  No mass effect or midline shift is noted.  Ventricular size  is within normal limits. Old lacunar infarction is seen involving the anterior limb of left internal capsule.  There is no evidence of mass lesion, hemorrhage or acute infarction.  IMPRESSION: Old lacunar infarction involving left internal capsule.  No acute intracranial abnormality seen.   Original Report Authenticated By: Lupita Raider.,  M.D.      1. Leg weakness   2. Hyperglycemia   3. CKD (chronic kidney disease) stage 4, GFR 15-29 ml/min       MDM  51 y.o. male w/ PMHx of CVA with residual left side weakness several years ago, CAD s/p CABG 10 months ago, CKD s/p fistula placement with plan to undergo dialysis in the near future presents w/ concern that he may have had a stroke yesterday. Now back at baseline without acute complaint. No LOC yesterday, no seizure activity. Triage labs mostly unremarkable except for increase in BUN in context  of known CKD and hyperglycemia. Plan to give 500 cc bolus. Ct head ordered. If unremarkable feel pt is stable for d/c with close PCP follow up.   CT head without acute change. Discussed this with patient and his sister at bedside. Both feel comfortably going home. Nursing unable to obtain IV after multiple attempts. Will give PO fluid here and encourage PO fluid at home which the patient is able to cooperate with. Recommended close PCP follow up for weakness, hyperglycemia, CKD. Counseling provided regarding diagnosis, treatment plan, follow up recommendations, and return precautions. Questions answered.     I independently viewed, interpreted, and used in my medical decision making all ordered lab and imaging tests. Medical Decision Making discussed with ED attending Leonette Most B. Bernette Mayers, MD          Charm Barges, MD 02/03/12 [phone]

## 2012-02-03 NOTE — ED Provider Notes (Signed)
I saw and evaluated the patient, reviewed the resident's note and I agree with the findings and plan. Agree with EKG interpretation if present.   Pt with bilateral lower extremity weakness, non-focal neuro exam. Recently started prednisone taper with resultant hyperglycemia.   Charles B. Bernette Mayers, MD 02/03/12 [phone]

## 2012-02-03 NOTE — ED Notes (Signed)
PT states that he has been having problems with legs buckling since yesterday.  Pt is reporting weakness in bothlegs but more so in left leg.  Pt has had one stroke which left him with left arm weakness.  Pt has been feeling fatigued.  No sob.  Pt has had 2 mi's this year.

## 2012-02-03 NOTE — ED Notes (Signed)
Resident informed of lack of IV access, pt will hydrate orally

## 2012-02-04 ENCOUNTER — Telehealth: Payer: Self-pay | Admitting: Internal Medicine

## 2012-02-04 ENCOUNTER — Encounter: Payer: Self-pay | Admitting: Internal Medicine

## 2012-02-04 ENCOUNTER — Ambulatory Visit (INDEPENDENT_AMBULATORY_CARE_PROVIDER_SITE_OTHER): Payer: Medicare Other | Admitting: Internal Medicine

## 2012-02-04 VITALS — BP 172/98 | HR 62 | Temp 99.3°F | Ht 72.0 in | Wt 244.4 lb

## 2012-02-04 DIAGNOSIS — I10 Essential (primary) hypertension: Secondary | ICD-10-CM

## 2012-02-04 DIAGNOSIS — R7309 Other abnormal glucose: Secondary | ICD-10-CM | POA: Diagnosis not present

## 2012-02-04 DIAGNOSIS — L299 Pruritus, unspecified: Secondary | ICD-10-CM

## 2012-02-04 DIAGNOSIS — Z8673 Personal history of transient ischemic attack (TIA), and cerebral infarction without residual deficits: Secondary | ICD-10-CM

## 2012-02-04 DIAGNOSIS — R29898 Other symptoms and signs involving the musculoskeletal system: Secondary | ICD-10-CM | POA: Insufficient documentation

## 2012-02-04 DIAGNOSIS — R7302 Impaired glucose tolerance (oral): Secondary | ICD-10-CM

## 2012-02-04 MED ORDER — FLUOCINONIDE 0.05 % EX CREA: TOPICAL_CREAM | Freq: Two times a day (BID) | CUTANEOUS | Status: DC

## 2012-02-04 MED ORDER — AMLODIPINE BESYLATE 10 MG PO TABS: 10.0000 mg | ORAL_TABLET | Freq: Every day | ORAL | Status: DC

## 2012-02-04 NOTE — Progress Notes (Signed)
Subjective:    Patient ID: [name], male    DOB: [birth year], 51 y.o.   MRN: [id number]  HPI  Here to f/u; c/o marked itching without juandice or rash but very upsetting to him, so much so he has missed 2 days of taking his norvasc and needs refilled.  Also c/o unuusal intermittent mild LLE weakness, that seems worse to ambulate last night (in fact fell x 1 without injury where "the legs just buckled", so today walking with walker b/c of fear of doing so again, though weakness only seemed to be present again on waking up this am and getting out of bed, better after a "short while."  Ambulates rather quickly with the walker in the office today.  No back pain or saddle anesthesia.  Has hx of CVA several yrs ago without recurrence.  Pt denies chest pain, increased sob or doe, wheezing, orthopnea, PND, increased LE swelling, palpitations, dizziness or syncope.  Pt denies new neurological symptoms such as new headache, or facial or extremity weakness or numbness except for the above.   Pt denies polydipsia, polyuria.  Seen in ER recent, UA/cbc/bmet with elev BS though ? Related to recent steroid., last steroid pill taken yesterday.  A1c is ordered future lab.    Past Medical History  Diagnosis Date  . ACHALASIA   . CEREBROVASCULAR ACCIDENT, HX OF   . DEGENERATIVE JOINT DISEASE, SHOULDER   . GERD   . Headache   . HYPERLIPIDEMIA   . HYPERTENSION   . RENAL CALCULUS, HX OF   . RENAL INSUFFICIENCY   . H/O hiatal hernia   . CAD (coronary artery disease), 3 vessel significant disease.    . Impaired glucose tolerance   . S/P PTCA (percutaneous transluminal coronary angioplasty), 06/30/11 07/01/2011  . CKD (chronic kidney disease) stage 4, GFR 15-29 ml/min   . History of diastolic dysfunction, grade 2 by echo   . S/P coronary artery stent placement, PTCA/DES Resolute to mid-LAD via the LIMA graft, and PTCA of apical 95% stenosis 07/05/11 07/04/2011  . NSTEMI (non-ST elevated myocardial infarction),  06/30/11 07/01/2011  . DEPRESSION   . Sleep apnea     SLEEP STUDY IN [place] , NO MACHINE YET  1 YEAR  . Stroke     93/2005/2006/2007  . Seizures     INSULIN INDUCED  . ANEMIA-NOS    Past Surgical History  Procedure Date  . Tonsillectomy   . Stomach surgery 2009    achalasia  . Knee surgery 01/07/2011    right  . Insertion of dialysis catheter 04/01/2011    Procedure: INSERTION OF DIALYSIS CATHETER;  Surgeon: Nilda Simmer, MD;  Location: [facility] OR;  Service: Vascular;  Laterality: N/A;  . Cardiac catheterization   . Coronary angioplasty     06/30/11   AT [facility]  . Coronary artery bypass graft 04/01/2011    Procedure: CORONARY ARTERY BYPASS GRAFTING (CABG);  Surgeon: Kathlee Nations Suann Larry, MD;  Location: [facility] OR;  Service: Open Heart Surgery;  Laterality: N/A;  . Av fistula placement 12/02/2011    Procedure: ARTERIOVENOUS (AV) FISTULA CREATION;  Surgeon: Fransisco Hertz, MD;  Location: [facility] OR;  Service: Vascular;  Laterality: Left;  Ultrasound Guided    reports that he has never smoked. He has never used smokeless tobacco. He reports that he drinks alcohol. He reports that he does not use illicit drugs. family history includes Hyperlipidemia in his other; Hypertension in his other; and Stroke in his other. Allergies  Allergen Reactions  . Shrimp (Shellfish Allergy) Shortness Of Breath  . Atorvastatin Other (See Comments)    REACTION: weakness  . Insulins Other (See Comments)    Patient unsure as to the kind of insulin but states that it has previously caused seizures. This occurred in the hospital in 2006; but in most recent hospitalization (Jan 2013) received insulin but did not have reaction  . Simvastatin Other (See Comments)    REACTION: abnormal liver tests  . Ultram (Tramadol)    Current Outpatient Prescriptions on File Prior to Visit  Medication Sig Dispense Refill  . acetaminophen (TYLENOL) 325 MG tablet Take 650 mg by mouth every 6 (six) hours as needed. For pain      . aspirin EC  81 MG EC tablet Take 1 tablet (81 mg total) by mouth daily.      . cloNIDine (CATAPRES) 0.2 MG tablet Take 0.2 mg by mouth 2 (two) times daily.       . febuxostat (ULORIC) 40 MG tablet Take 40 mg by mouth 2 (two) times daily.      . furosemide (LASIX) 40 MG tablet Take 40 mg by mouth daily.      Marland Kitchen glucose blood (ONE TOUCH ULTRA TEST) test strip Use as instructed  100 each  12  . hydrALAZINE (APRESOLINE) 10 MG tablet Take 10 mg by mouth 2 (two) times daily.      . hydrOXYzine (ATARAX/VISTARIL) 10 MG tablet Take 10 mg by mouth 3 (three) times daily as needed. For itching      . isosorbide mononitrate (IMDUR) 60 MG 24 hr tablet Take 1 tablet (60 mg total) by mouth daily.  30 tablet  6  . metoprolol (LOPRESSOR) 50 MG tablet Take 50 mg by mouth 2 (two) times daily.      . Omega-3 Fatty Acids (FISH OIL) 1200 MG CAPS Take 1 capsule by mouth 3 (three) times daily.      . polysaccharide iron (NIFEREX) 150 MG CAPS capsule Take 150 mg by mouth daily.      . predniSONE (DELTASONE) 10 MG tablet 3 tabs by mouth per day for 3 days,2tabs per day for 3 days,1tab per day for 3 days  18 tablet  0  . ranolazine (RANEXA) 500 MG 12 hr tablet Take 1 tablet (500 mg total) by mouth every morning.  30 tablet  6  . rosuvastatin (CRESTOR) 20 MG tablet Take 1 tablet (20 mg total) by mouth daily at 6 PM.  30 tablet  6  . terazosin (HYTRIN) 10 MG capsule Take 10 mg by mouth at bedtime.      . Ticagrelor (BRILINTA) 90 MG TABS tablet Take 1 tablet (90 mg total) by mouth 2 (two) times daily.  60 tablet  11  . [DISCONTINUED] amLODipine (NORVASC) 10 MG tablet Take 1 tablet (10 mg total) by mouth daily.  30 tablet  6   Current Facility-Administered Medications on File Prior to Visit  Medication Dose Route Frequency Provider Last Rate Last Dose  . [DISCONTINUED] sodium chloride 0.9 % bolus 500 mL  500 mL Intravenous Once Charm Barges, MD       Review of Systems  Constitutional: Negative for diaphoresis and unexpected weight  change.  HENT: Negative for tinnitus.   Eyes: Negative for photophobia and visual disturbance.  Respiratory: Negative for choking and stridor.   Gastrointestinal: Negative for vomiting and blood in stool.  Genitourinary: Negative for hematuria and decreased urine volume.  Musculoskeletal: Negative for gait problem.  Skin: Negative for color change and wound.  Neurological: Negative for tremors and numbness.  Psychiatric/Behavioral: Negative for decreased concentration. The patient is not hyperactive.       Objective:   Physical Exam BP 172/98  Pulse 62  Temp 99.3 F (37.4 C) (Oral)  Ht 6' (1.829 m)  Wt 244 lb 6 oz (110.848 kg)  BMI 33.14 kg/m2  SpO2 97% Physical Exam  VS noted, not ill appearing Constitutional: Pt appears well-developed and well-nourished.  HENT: Head: Normocephalic.  Right Ear: External ear normal.  Left Ear: External ear normal.  Eyes: Conjunctivae and EOM are normal. Pupils are equal, round, and reactive to light.  Neck: Normal range of motion. Neck supple.  Cardiovascular: Normal rate and regular rhythm.   Pulmonary/Chest: Effort normal and breath sounds normal.  Abd:  Soft, NT, non-distended, + BS Neurological: Pt is alert. Not confused , motor exam somewhat variable but with good effort is intact 5/5, dtr/gait intact Skin: Skin is warm. No erythema. No LE edema, no rash Psychiatric: Pt behavior is normal.except 1+ nervous.     Assessment & Plan:

## 2012-02-04 NOTE — Assessment & Plan Note (Addendum)
Etiology unclear, difficult hx and exam, Mostly subjective it seems, intermittent symptom, fell last night after left leg gave out, now using walker today when usually does not do that. Continue all other medications as before with asa 81, for MRI head, and neuro referral - ? TIA  Note:  Total time for pt hx, exam, review of record with pt in the room, determination of diagnoses and plan for further eval and tx is > 40 min, with over 50% spent in coordination and counseling of patient

## 2012-02-04 NOTE — Assessment & Plan Note (Signed)
Despite his mention of only out of norvasc for 1-2 days, I wonder if not more than that; to re-start the norvasc, Continue all other medications as before, cont to monitor BP at home and next visit BP Readings from Last 3 Encounters:  02/04/12 172/98  02/03/12 160/103  01/28/12 122/80

## 2012-02-04 NOTE — Assessment & Plan Note (Signed)
Unclear etiology or how would be related to leg weakness, ok for lidex prn for worst places of itching, avoid further oral steroid or similar due to elev BS

## 2012-02-04 NOTE — Patient Instructions (Addendum)
Take all new medications as prescribed - the cream Continue all other medications as before, including re-starting the norvasc Your medications were sent to [facility] No further blood work needed today Thank you for enrolling in MyChart. Please follow the instructions below to securely access your online medical record. MyChart allows you to send messages to your doctor, view your test results, renew your prescriptions, schedule appointments, and more To Log into MyChart, please go to [email or web address], and your Username is: [email or web address]  You will be contacted regarding the referral for: Head MRI, and Neurology

## 2012-02-04 NOTE — Telephone Encounter (Signed)
Patient Information:  Caller Name: Gustav  Phone: [phone]  Patient: [name], [name]  Gender: Male  DOB: [birth year]  Age: 51 Years  PCP: Oliver Barre (Adults only)   Symptoms  Reason For Call & Symptoms: seen in ED Tuesday for stroke like sx, CT was negative but still has sx  Reviewed Health History In EMR: Yes  Reviewed Medications In EMR: Yes  Reviewed Allergies In EMR: Yes  Date of Onset of Symptoms: 02/02/2012  Guideline(s) Used:  Weakness (Generalized) and Fatigue  Disposition Per Guideline:   See Today in Office  Reason For Disposition Reached:   Moderate weakness (i.e., interferes with work, school, normal activities) and persists > 3 days  Advice Given:  N/A  Office Follow Up:  Does the office need to follow up with this patient?: No  Instructions For The Office: N/A  Appointment Scheduled:  02/04/2012 16:00:00  RN Note:  Having weakness in his legs, can be standing and his legs buckle.  No pain.  No known trauma.  Was seen in the ED on Tuesday for sx and had a CT scan.  He is on disability after surviving 4 CVA's in the past.

## 2012-02-04 NOTE — Assessment & Plan Note (Signed)
stable overall by hx and exam, most recent data reviewed with pt, and pt to continue medical treatment as before Lab Results  Component Value Date   HGBA1C 6.3 06/30/2011    

## 2012-02-05 ENCOUNTER — Encounter: Payer: Self-pay | Admitting: Vascular Surgery

## 2012-02-06 ENCOUNTER — Encounter (INDEPENDENT_AMBULATORY_CARE_PROVIDER_SITE_OTHER): Payer: Medicare Other | Admitting: *Deleted

## 2012-02-06 ENCOUNTER — Ambulatory Visit (INDEPENDENT_AMBULATORY_CARE_PROVIDER_SITE_OTHER): Payer: Medicare Other | Admitting: Vascular Surgery

## 2012-02-06 ENCOUNTER — Encounter: Payer: Self-pay | Admitting: Vascular Surgery

## 2012-02-06 VITALS — BP 125/84 | HR 55 | Resp 16 | Ht 72.0 in | Wt 244.0 lb

## 2012-02-06 DIAGNOSIS — I739 Peripheral vascular disease, unspecified: Secondary | ICD-10-CM | POA: Diagnosis not present

## 2012-02-06 DIAGNOSIS — Z4931 Encounter for adequacy testing for hemodialysis: Secondary | ICD-10-CM | POA: Diagnosis not present

## 2012-02-06 DIAGNOSIS — N184 Chronic kidney disease, stage 4 (severe): Secondary | ICD-10-CM

## 2012-02-06 DIAGNOSIS — N186 End stage renal disease: Secondary | ICD-10-CM

## 2012-02-06 NOTE — Progress Notes (Signed)
[facility] [place]  Postoperative Access Visit  History of Present Illness  [name] is a 51 y.o. year old male who presents for postoperative follow-up for: L RC AVF (Date: 12/02/11).  The patient's wounds are healed.  The patient notes no steal symptoms.  The patient is able to complete their activities of daily living.  The patient's current symptoms are: progressive weakness in both legs.  Pt is the process of work-up of his left weakness.Marland Kitchen  Physical Examination  Filed Vitals:   02/06/12 1412  BP: 125/84  Pulse: 55  Resp: 16   LUE: Incision is healed, skin feels warm, hand grip is 5/5, sensation in digits is intact, palpable thrill, bruit can be auscultated   L arm access duplex (Date: 02/06/12)  Diameter: 2.4-4.7 mm  Depth: 3.6-4.1 mm  Juxta-anastomotic segment: 287-669 c/s  L arm arterial duplex (Date: 02/06/12)  Prox. SCA: 111 c/s (Monophasic)  Mid SCA: 658 c/s  Patent axillary, brachial, ulnar and radial arteries: monophasic  Medical Decision Making  [name] is a 51 y.o. year old male who presents s/p L RC AVF.  Pt's arterial duplex demonstrates inflow compromise in the left arm.  Rather than giving up on the L arm, I think it is reasonable to perform: arch aortogram, left arm angiogram, and possible intervention.  This is scheduled for the 5 DEC 13. I discussed with the patient the nature of angiographic procedures, especially the limited patencies of any endovascular intervention.  The patient is aware of that the risks of an angiographic procedure include but are not limited to: bleeding, infection, access site complications, renal failure, embolization, rupture of vessel, dissection, possible need for emergent surgical intervention, possible need for surgical procedures to treat the patient's pathology, and stroke and death.   The patient is aware of the risks and agrees to proceed.  Thank you for allowing [place] to  participate in this patient's care.  Leonides Sake, MD [facility] [place] Office: [phone] Pager: [phone]

## 2012-02-06 NOTE — Progress Notes (Signed)
Left fistula duplex performed @ [facility] 02/06/2012

## 2012-02-09 ENCOUNTER — Ambulatory Visit
Admission: RE | Admit: 2012-02-09 | Discharge: 2012-02-09 | Disposition: A | Discharge status: Home / Self Care | Payer: Medicare Other | Source: Ambulatory Visit | Attending: Internal Medicine | Admitting: Internal Medicine

## 2012-02-09 ENCOUNTER — Encounter (HOSPITAL_COMMUNITY): Payer: Self-pay | Admitting: Pharmacy Technician

## 2012-02-09 DIAGNOSIS — I6350 Cerebral infarction due to unspecified occlusion or stenosis of unspecified cerebral artery: Secondary | ICD-10-CM | POA: Diagnosis not present

## 2012-02-09 DIAGNOSIS — I6789 Other cerebrovascular disease: Secondary | ICD-10-CM | POA: Diagnosis not present

## 2012-02-09 DIAGNOSIS — R29898 Other symptoms and signs involving the musculoskeletal system: Secondary | ICD-10-CM

## 2012-02-09 DIAGNOSIS — Z8673 Personal history of transient ischemic attack (TIA), and cerebral infarction without residual deficits: Secondary | ICD-10-CM

## 2012-02-10 ENCOUNTER — Other Ambulatory Visit: Payer: Self-pay

## 2012-02-11 DIAGNOSIS — I10 Essential (primary) hypertension: Secondary | ICD-10-CM | POA: Diagnosis not present

## 2012-02-11 DIAGNOSIS — N2581 Secondary hyperparathyroidism of renal origin: Secondary | ICD-10-CM | POA: Diagnosis not present

## 2012-02-11 DIAGNOSIS — I6350 Cerebral infarction due to unspecified occlusion or stenosis of unspecified cerebral artery: Secondary | ICD-10-CM | POA: Diagnosis not present

## 2012-02-11 DIAGNOSIS — G8190 Hemiplegia, unspecified affecting unspecified side: Secondary | ICD-10-CM | POA: Diagnosis not present

## 2012-02-11 DIAGNOSIS — D649 Anemia, unspecified: Secondary | ICD-10-CM | POA: Diagnosis not present

## 2012-02-11 DIAGNOSIS — R269 Unspecified abnormalities of gait and mobility: Secondary | ICD-10-CM | POA: Diagnosis not present

## 2012-02-11 DIAGNOSIS — N186 End stage renal disease: Secondary | ICD-10-CM | POA: Diagnosis not present

## 2012-02-11 DIAGNOSIS — I120 Hypertensive chronic kidney disease with stage 5 chronic kidney disease or end stage renal disease: Secondary | ICD-10-CM | POA: Diagnosis not present

## 2012-02-11 DIAGNOSIS — N185 Chronic kidney disease, stage 5: Secondary | ICD-10-CM | POA: Diagnosis not present

## 2012-02-18 MED ORDER — SODIUM CHLORIDE 0.9 % IJ SOLN: 3.0000 mL | INTRAMUSCULAR | Status: DC | PRN

## 2012-02-19 ENCOUNTER — Ambulatory Visit (HOSPITAL_COMMUNITY)
Admission: RE | Admit: 2012-02-19 | Discharge: 2012-02-19 | Disposition: A | Discharge status: Home / Self Care | Payer: Medicare Other | Source: Ambulatory Visit | Attending: Vascular Surgery | Admitting: Vascular Surgery

## 2012-02-19 ENCOUNTER — Encounter (HOSPITAL_COMMUNITY)
Admission: RE | Disposition: A | Discharge status: Home / Self Care | Payer: Self-pay | Source: Ambulatory Visit | Attending: Vascular Surgery

## 2012-02-19 ENCOUNTER — Telehealth: Payer: Self-pay | Admitting: Vascular Surgery

## 2012-02-19 DIAGNOSIS — T82598A Other mechanical complication of other cardiac and vascular devices and implants, initial encounter: Secondary | ICD-10-CM | POA: Insufficient documentation

## 2012-02-19 DIAGNOSIS — I129 Hypertensive chronic kidney disease with stage 1 through stage 4 chronic kidney disease, or unspecified chronic kidney disease: Secondary | ICD-10-CM | POA: Diagnosis not present

## 2012-02-19 DIAGNOSIS — I252 Old myocardial infarction: Secondary | ICD-10-CM | POA: Insufficient documentation

## 2012-02-19 DIAGNOSIS — N184 Chronic kidney disease, stage 4 (severe): Secondary | ICD-10-CM | POA: Diagnosis not present

## 2012-02-19 DIAGNOSIS — R7309 Other abnormal glucose: Secondary | ICD-10-CM | POA: Insufficient documentation

## 2012-02-19 DIAGNOSIS — Y849 Medical procedure, unspecified as the cause of abnormal reaction of the patient, or of later complication, without mention of misadventure at the time of the procedure: Secondary | ICD-10-CM | POA: Insufficient documentation

## 2012-02-19 DIAGNOSIS — E785 Hyperlipidemia, unspecified: Secondary | ICD-10-CM | POA: Insufficient documentation

## 2012-02-19 DIAGNOSIS — Y92009 Unspecified place in unspecified non-institutional (private) residence as the place of occurrence of the external cause: Secondary | ICD-10-CM | POA: Insufficient documentation

## 2012-02-19 DIAGNOSIS — I2510 Atherosclerotic heart disease of native coronary artery without angina pectoris: Secondary | ICD-10-CM | POA: Diagnosis not present

## 2012-02-19 DIAGNOSIS — K219 Gastro-esophageal reflux disease without esophagitis: Secondary | ICD-10-CM | POA: Diagnosis not present

## 2012-02-19 DIAGNOSIS — G4730 Sleep apnea, unspecified: Secondary | ICD-10-CM | POA: Diagnosis not present

## 2012-02-19 DIAGNOSIS — D631 Anemia in chronic kidney disease: Secondary | ICD-10-CM | POA: Diagnosis not present

## 2012-02-19 DIAGNOSIS — I771 Stricture of artery: Secondary | ICD-10-CM

## 2012-02-19 DIAGNOSIS — I708 Atherosclerosis of other arteries: Secondary | ICD-10-CM

## 2012-02-19 DIAGNOSIS — Z8673 Personal history of transient ischemic attack (TIA), and cerebral infarction without residual deficits: Secondary | ICD-10-CM | POA: Diagnosis not present

## 2012-02-19 HISTORY — PX: ARCH AORTOGRAM: SHX5501

## 2012-02-19 LAB — POCT I-STAT, CHEM 8
Chloride: 111 mEq/L (ref 96–112)
HCT: 37 % — ABNORMAL LOW (ref 39.0–52.0)
Potassium: 3.8 mEq/L (ref 3.5–5.1)
Sodium: 142 mEq/L (ref 135–145)

## 2012-02-19 LAB — POCT ACTIVATED CLOTTING TIME: Activated Clotting Time: 180 seconds

## 2012-02-19 SURGERY — ARCH AORTOGRAM
Anesthesia: LOCAL

## 2012-02-19 MED ORDER — LIDOCAINE HCL (PF) 1 % IJ SOLN
INTRAMUSCULAR | Status: AC
  Filled 2012-02-19: qty 30

## 2012-02-19 MED ORDER — CLOPIDOGREL BISULFATE 75 MG PO TABS
ORAL_TABLET | ORAL | Status: AC
  Filled 2012-02-19: qty 3

## 2012-02-19 MED ORDER — OXYCODONE-ACETAMINOPHEN 5-325 MG PO TABS: 1.0000 | ORAL_TABLET | ORAL | Status: DC | PRN

## 2012-02-19 MED ORDER — CLOPIDOGREL BISULFATE 75 MG PO TABS
ORAL_TABLET | ORAL | Status: AC
  Filled 2012-02-19: qty 1

## 2012-02-19 MED ORDER — DIPHENHYDRAMINE HCL 50 MG/ML IJ SOLN
INTRAMUSCULAR | Status: AC
  Filled 2012-02-19: qty 1

## 2012-02-19 MED ORDER — METHYLPREDNISOLONE SODIUM SUCC 125 MG IJ SOLR
INTRAMUSCULAR | Status: AC
  Filled 2012-02-19: qty 2

## 2012-02-19 MED ORDER — CLOPIDOGREL BISULFATE 75 MG PO TABS
300.0000 mg | ORAL_TABLET | Freq: Once | ORAL | Status: AC
  Administered 2012-02-19: 225 mg via ORAL

## 2012-02-19 MED ORDER — SODIUM CHLORIDE 0.9 % IJ SOLN: 3.0000 mL | Freq: Two times a day (BID) | INTRAMUSCULAR | Status: DC

## 2012-02-19 MED ORDER — MORPHINE SULFATE 2 MG/ML IJ SOLN: 2.0000 mg | INTRAMUSCULAR | Status: DC | PRN

## 2012-02-19 MED ORDER — HYDRALAZINE HCL 20 MG/ML IJ SOLN
INTRAMUSCULAR | Status: AC
  Filled 2012-02-19: qty 1

## 2012-02-19 MED ORDER — METHYLPREDNISOLONE SODIUM SUCC 125 MG IJ SOLR
125.0000 mg | Freq: Once | INTRAMUSCULAR | Status: AC
  Administered 2012-02-19: 125 mg via INTRAVENOUS

## 2012-02-19 MED ORDER — ACETAMINOPHEN 325 MG PO TABS: 650.0000 mg | ORAL_TABLET | ORAL | Status: DC | PRN

## 2012-02-19 MED ORDER — HEPARIN SODIUM (PORCINE) 1000 UNIT/ML IJ SOLN
INTRAMUSCULAR | Status: AC
  Filled 2012-02-19: qty 1

## 2012-02-19 MED ORDER — CLOPIDOGREL BISULFATE 75 MG PO TABS: 75.0000 mg | ORAL_TABLET | Freq: Every day | ORAL | Status: DC

## 2012-02-19 MED ORDER — ONDANSETRON HCL 4 MG/2ML IJ SOLN: 4.0000 mg | Freq: Four times a day (QID) | INTRAMUSCULAR | Status: DC | PRN

## 2012-02-19 MED ORDER — SODIUM CHLORIDE 0.9 % IV SOLN: 250.0000 mL | INTRAVENOUS | Status: DC

## 2012-02-19 MED ORDER — CLOPIDOGREL BISULFATE 75 MG PO TABS
75.0000 mg | ORAL_TABLET | Freq: Once | ORAL | Status: AC
  Administered 2012-02-19: 75 mg via ORAL

## 2012-02-19 MED ORDER — SODIUM CHLORIDE 0.9 % IJ SOLN: 3.0000 mL | INTRAMUSCULAR | Status: DC | PRN

## 2012-02-19 MED ORDER — DIPHENHYDRAMINE HCL 50 MG/ML IJ SOLN
25.0000 mg | Freq: Once | INTRAMUSCULAR | Status: AC
  Administered 2012-02-19: 25 mg via INTRAVENOUS

## 2012-02-19 MED ORDER — FAMOTIDINE IN NACL 20-0.9 MG/50ML-% IV SOLN
20.0000 mg | Freq: Once | INTRAVENOUS | Status: AC
  Administered 2012-02-19: 20 mg via INTRAVENOUS
  Filled 2012-02-19: qty 50

## 2012-02-19 NOTE — H&P (Signed)
[facility] [place]  Brief History and Physical  History of Present Illness  [name] is a 51 y.o. male who presents with chief complaint: poorly functioning L RC AVF.  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 The patient presents today for: arch aortogram, left arm angiogram, and possible intervention.  Past Medical History  Diagnosis Date  . ACHALASIA   . CEREBROVASCULAR ACCIDENT, HX OF   . DEGENERATIVE JOINT DISEASE, SHOULDER   . GERD   . Headache   . HYPERLIPIDEMIA   . HYPERTENSION   . RENAL CALCULUS, HX OF   . RENAL INSUFFICIENCY   . H/O hiatal hernia   . CAD (coronary artery disease), 3 vessel significant disease.    . Impaired glucose tolerance   . S/P PTCA (percutaneous transluminal coronary angioplasty), 06/30/11 07/01/2011  . CKD (chronic kidney disease) stage 4, GFR 15-29 ml/min   . History of diastolic dysfunction, grade 2 by echo   . S/P coronary artery stent placement, PTCA/DES Resolute to mid-LAD via the LIMA graft, and PTCA of apical 95% stenosis 07/05/11 07/04/2011  . NSTEMI (non-ST elevated myocardial infarction), 06/30/11 07/01/2011  . DEPRESSION   . Sleep apnea     SLEEP STUDY IN [place] , NO MACHINE YET  1 YEAR  . Stroke     93/2005/2006/2007  . Seizures     INSULIN INDUCED  . ANEMIA-NOS     Past Surgical History  Procedure Date  . Tonsillectomy   . Stomach surgery 2009    achalasia  . Knee surgery 01/07/2011    right  . Insertion of dialysis catheter 04/01/2011    Procedure: INSERTION OF DIALYSIS CATHETER;  Surgeon: Nilda Simmer, MD;  Location: [facility] OR;  Service: Vascular;  Laterality: N/A;  . Cardiac catheterization   . Coronary angioplasty     06/30/11   AT [facility]  . Coronary artery bypass graft 04/01/2011    Procedure: CORONARY ARTERY BYPASS GRAFTING (CABG);  Surgeon: Kathlee Nations Suann Larry, MD;  Location: [facility] OR;  Service: Open Heart Surgery;  Laterality: N/A;  . Av fistula  placement 12/02/2011    Procedure: ARTERIOVENOUS (AV) FISTULA CREATION;  Surgeon: Fransisco Hertz, MD;  Location: [facility] OR;  Service: Vascular;  Laterality: Left;  Ultrasound Guided    History   Social History  . Marital Status: Divorced    Spouse Name: N/A    Number of Children: N/A  . Years of Education: N/A   Occupational History  . Not on file.   Social History Main Topics  . Smoking status: Never Smoker   . Smokeless tobacco: Never Used  . Alcohol Use: Yes     Comment: rarely  . Drug Use: No  . Sexually Active: Not on file   Other Topics Concern  . Not on file   Social History Narrative  . No narrative on file    Family History  Problem Relation Age of Onset  . Hypertension Other   . Stroke Other   . Hyperlipidemia Other   . Cancer Mother   . Heart disease Mother   . Hyperlipidemia Mother   . Hypertension Mother   . Cancer Father   . Heart disease Father     No current facility-administered medications on file prior to encounter.   Current Outpatient Prescriptions on File Prior to Encounter  Medication Sig Dispense Refill  . acetaminophen (TYLENOL) 325 MG tablet Take 650 mg by mouth every  6 (six) hours as needed. For pain      . amLODipine (NORVASC) 10 MG tablet Take 1 tablet (10 mg total) by mouth daily.  90 tablet  3  . aspirin EC 81 MG EC tablet Take 1 tablet (81 mg total) by mouth daily.      . cloNIDine (CATAPRES) 0.2 MG tablet Take 0.2 mg by mouth 2 (two) times daily.       . febuxostat (ULORIC) 40 MG tablet Take 40 mg by mouth 2 (two) times daily.      . fluocinonide cream (LIDEX) 0.05 % Apply topically 2 (two) times daily.  30 g  1  . furosemide (LASIX) 40 MG tablet Take 40 mg by mouth daily.      . hydrALAZINE (APRESOLINE) 10 MG tablet Take 10 mg by mouth 2 (two) times daily.      . hydrOXYzine (ATARAX/VISTARIL) 10 MG tablet Take 10 mg by mouth 2 (two) times daily. For itching      . isosorbide mononitrate (IMDUR) 60 MG 24 hr tablet Take 1 tablet (60 mg  total) by mouth daily.  30 tablet  6  . metoprolol (LOPRESSOR) 50 MG tablet Take 50 mg by mouth 2 (two) times daily.      . Omega-3 Fatty Acids (FISH OIL) 1200 MG CAPS Take 1 capsule by mouth 3 (three) times daily.      . polysaccharide iron (NIFEREX) 150 MG CAPS capsule Take 150 mg by mouth daily.      . ranolazine (RANEXA) 500 MG 12 hr tablet Take 1 tablet (500 mg total) by mouth every morning.  30 tablet  6  . rosuvastatin (CRESTOR) 20 MG tablet Take 20 mg by mouth every evening.      . terazosin (HYTRIN) 10 MG capsule Take 10 mg by mouth at bedtime.      . Ticagrelor (BRILINTA) 90 MG TABS tablet Take 1 tablet (90 mg total) by mouth 2 (two) times daily.  60 tablet  11    Allergies  Allergen Reactions  . Shrimp (Shellfish Allergy) Shortness Of Breath  . Atorvastatin Other (See Comments)    REACTION: weakness  . Insulins Other (See Comments)    Patient unsure as to the kind of insulin but states that it has previously caused seizures. This occurred in the hospital in 2006; but in most recent hospitalization (Jan 2013) received insulin but did not have reaction  . Simvastatin Other (See Comments)    REACTION: abnormal liver tests  . Ultram (Tramadol)     Review of Systems: As listed above, otherwise negative.  Physical Examination  Filed Vitals:   02/19/12 0955  BP: 131/75  Pulse: 59  Temp: 98.4 F (36.9 C)  TempSrc: Oral  Resp: 18  Height: 6' (1.829 m)  Weight: 238 lb (107.956 kg)    General: A&O x 3, WDWN  Pulmonary: Sym exp, good air movt, CTAB, no rales, rhonchi, & wheezing  Cardiac: RRR, Nl S1, S2, no Murmurs, rubs or gallops  Gastrointestinal: soft, NTND, -G/R, - HSM, - masses, - CVAT B  Musculoskeletal: M/S 5/5 throughout , Extremities without ischemic changes , L RC AVF with thrill and bruit  Laboratory See iStat  Medical Decision Making  [name] is a 51 y.o. male who presents with: Likely L SCA stenosis/occlusion, poor L RC AVF  maturation.   The patient is scheduled for: arch aortogram, left arm angiogram, and possible intervention. I discussed with the patient the nature of angiographic procedures,  especially the limited patencies of any endovascular intervention.  The patient is aware of that the risks of an angiographic procedure include but are not limited to: bleeding, infection, access site complications, renal failure, embolization, rupture of vessel, dissection, possible need for emergent surgical intervention, possible need for surgical procedures to treat the patient's pathology, and stroke (~1%) and death.    The patient is aware of the risks and agrees to proceed.  Leonides Sake, MD [facility] [place] Office: [phone] Pager: [phone]  02/19/2012, 7:51 AM

## 2012-02-19 NOTE — Telephone Encounter (Signed)
Message copied by Fredrich Birks on Thu Feb 19, 2012  3:45 PM ------      Message from: Melene Plan      Created: Thu Feb 19, 2012  3:28 PM                   ----- Message -----         From: Fransisco Hertz, MD         Sent: 02/19/2012  12:56 PM           To: Reuel Derby, Melene Plan, RN            [name]      [id number]      1960-04-06                  PROCEDURE:      1.  Right common femoral artery cannulation under ultrasound guidance      2.  Arch aortogram      3.  Second order arterial selection      4.  Left arm fistulogram      5.  Stenting of left subclavian artery (iCAST 6 mm x 19 mm)            Follow-up: 4-6 weeks

## 2012-02-19 NOTE — Op Note (Signed)
OPERATIVE NOTE   PROCEDURE: 1.  Right common femoral artery cannulation under ultrasound guidance 2.  Arch aortogram 3.  Second order arterial selection 4.  Left arm fistulogram 5.  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 compromising maturation of left radiocephalic arteriovenous fistula    POST-OPERATIVE DIAGNOSIS: same as above   SURGEON: Leonides Sake, MD  ANESTHESIA: conscious sedation  ESTIMATED BLOOD LOSS: 30 cc  CONTRAST: 160 cc  FINDING(S): Patent type I aortic arch Patent innominate, right subclavian, and common carotid arteries Patent left common carotid artery Patent left subclavian artery with stenotic segment distal to vertebral artery with 70 mm Hg gradient: resolved after stenting Widely patent left radiocephalic arteriovenous fistula without evidence of disease in forearm   SPECIMEN(S):  none  INDICATIONS:   [name] is a 51 y.o. male who presents with poor maturing left radiocephalic arteriovenous fistula.  On his follow up exam, I had concern he had inflow disease.  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  The patient presents for: arch aortogram, left arm angiogram, and possible intervention.  I discussed with the patient the nature of angiographic procedures, especially the limited patencies of any endovascular intervention.  The patient is aware of that the risks of an angiographic procedure include but are not limited to: bleeding, infection, access site complications, renal failure, embolization, rupture of vessel, dissection, possible need for emergent surgical intervention, possible need for surgical procedures to treat the patient's pathology, and stroke and death.  The patient is aware of the risks and agrees to proceed.  DESCRIPTION: After full informed consent was obtained from the patient, the patient was brought back to the angiography  suite.  The patient was placed supine upon the angiography table and connected to monitoring equipment.  The patient was then given conscious sedation, the amounts of which are documented in the patient's chart.  The patient was prepped and drape in the standard fashion for an angiographic procedure.  At this point, attention was turned to the right groin.  Under ultrasound guidance, the right common femoral artery will be cannulated with a 18 gauge needle.  The [facility] wire was passed up into the aorta.  The needle was exchanged for a 5-Fr sheath, which was advanced over the wire into the common femoral artery.  The dilator was then removed.  The pigtail catheter was then loaded over the wire up to the level of the ascending aorta.   The catheter was connected to the power injector circuit.  After de-airring and de-clotting the circuit, a power injector arch aortogram was completed at 30 degrees LAO.  This demonstrated the findings noted above.  I exchanged the catheter for a H1 catheter in the descending thoracic aorta.  Using a Teena Dunk and the H1 catheter, I selected the left subclavian artery artery and advanced just distal to vertebral artery takeoff.  A power injection here demonstrated a 13 mm stenosis in the left subclavian artery.  I was able to cross this stenosis and did computed a gradient across this lesion,  70 mm Hg drop off was noted, so intervention was necessary.  I advanced the catheter and wire into the left axillary artery.  The left arm angiogram was completed in stations.  Due to the presence of a patent radiocephalic arteriovenous fistula, we also completed the left arm fistulogram at the same time.  The findings are listed above.  At this  point, I exchanged the wire for a Rosen wire.  The catheter was removed.  I exchanged the sheath for a long 6-Fr Destination sheath which was lodge into the left subclavian artery, just distal to the vertebral artery.  The patient was given 8000 units of  Heparin intravenously, which was a therapeutic bolus.  I did a hand injection to verify the location of the subclavian artery stenosis.  I deployed an iCAST balloon expandable covered stent 6 mm x 19 mm centered at the subclavian artery stenosis.  The balloon was held at 11 atm for 1 minute.  The waist at this location resolved completely.  I hid a repeat hand injection which demonstrated complete resolution of the stenosis without any obvious complications.  At this point,  I replaced the dilator in the sheath and recaptured the tip of the [facility] wire.  The wire and sheath were pulled down in the pelvis.  I exchanged the sheath for a normal 6-Fr sheath.  The wire was removed.  The sheath was aspirated.  No clots were present and the sheath was reloaded with heparinized saline.    COMPLICATIONS: none  CONDITION: stable   Leonides Sake, MD [facility] [place] Office: [phone] Pager: [phone]  02/19/2012, 12:42 PM

## 2012-02-19 NOTE — Telephone Encounter (Signed)
Spoke with patients sister [name] to notify of follow up appt, dpm

## 2012-02-20 LAB — POCT ACTIVATED CLOTTING TIME: Activated Clotting Time: 189 seconds

## 2012-02-23 ENCOUNTER — Other Ambulatory Visit (HOSPITAL_COMMUNITY): Payer: Self-pay | Admitting: Cardiovascular Disease

## 2012-02-23 DIAGNOSIS — I2510 Atherosclerotic heart disease of native coronary artery without angina pectoris: Secondary | ICD-10-CM

## 2012-02-23 DIAGNOSIS — I214 Non-ST elevation (NSTEMI) myocardial infarction: Secondary | ICD-10-CM

## 2012-02-26 ENCOUNTER — Telehealth: Payer: Self-pay | Admitting: Internal Medicine

## 2012-02-26 DIAGNOSIS — E785 Hyperlipidemia, unspecified: Secondary | ICD-10-CM | POA: Diagnosis not present

## 2012-02-26 DIAGNOSIS — I69959 Hemiplegia and hemiparesis following unspecified cerebrovascular disease affecting unspecified side: Secondary | ICD-10-CM | POA: Diagnosis not present

## 2012-02-26 DIAGNOSIS — J309 Allergic rhinitis, unspecified: Secondary | ICD-10-CM

## 2012-02-26 DIAGNOSIS — Z951 Presence of aortocoronary bypass graft: Secondary | ICD-10-CM | POA: Diagnosis not present

## 2012-02-26 DIAGNOSIS — I2510 Atherosclerotic heart disease of native coronary artery without angina pectoris: Secondary | ICD-10-CM | POA: Diagnosis not present

## 2012-02-26 DIAGNOSIS — N186 End stage renal disease: Secondary | ICD-10-CM | POA: Diagnosis not present

## 2012-02-26 DIAGNOSIS — I120 Hypertensive chronic kidney disease with stage 5 chronic kidney disease or end stage renal disease: Secondary | ICD-10-CM | POA: Diagnosis not present

## 2012-02-26 NOTE — Telephone Encounter (Signed)
Done per emr 

## 2012-02-26 NOTE — Telephone Encounter (Signed)
Pt wants a referral for allergies.

## 2012-02-26 NOTE — Telephone Encounter (Signed)
Patient Information:  Caller Name: [name]  Phone: [phone]  Patient: [name], [name]  Gender: Male  DOB: 1960-05-11  Age: 51 Years  PCP: Oliver Barre (Adults only)  Office Follow Up:  Does the office need to follow up with this patient?: Yes  Instructions For The Office: Request Script Home Health reports on low Room air saturations.  RN Note: (1)  Patient is itching requesting script for Lidex and Allergy referral. (2)Home Health Bobetta Lime RN -[facility] wants office aware that patient had a room air sat in 80% when she arrived. Do they want 02?  want to watch the patient. (3) Patient wants allergy referrral Advised I would forward information  Symptoms  Reason For Call & Symptoms: (#1)  Patient is calling in regards to itching/scratching. Onset for 3 weeks. He has been seen twice in the office and received injections and cream and referred to Allergist..  He states he wants the referral and repeat Rx for the itching cream. Noted in Epic a referral for an allergist.  Patient states he has not been contacted.   (#2) Home Health Nurse Bobetta Lime RN- [facility]. Is at patients house- She states that his 02 sat is 80% RA. Does not appear uncomfortable but weak. Patient complaining of leg weakness.  She had him take deep breaths and it rose to mid 90"s  sitting 128/84    standing 122/80  P82 RR 16 . Afebrile and O2 sat now is 99%  Reviewed Health History In EMR: Yes  Reviewed Medications In EMR: Yes  Reviewed Allergies In EMR: Yes  Reviewed Surgeries / Procedures: No  Date of Onset of Symptoms: 02/05/2012  Treatments Tried: Lidex Cream and Steroid injections  Treatments Tried Worked: No  Guideline(s) Used:  Itching - Widespread  Disposition Per Guideline:   See Today in Office  Reason For Disposition Reached:   Severe widespread itching (i.e., interferes with sleep, normal activities or school) and not improved after 24 hours of itching Care Advice  Advice  Given:  Call Back If:  You become worse.

## 2012-02-27 ENCOUNTER — Ambulatory Visit (INDEPENDENT_AMBULATORY_CARE_PROVIDER_SITE_OTHER): Payer: Medicare Other | Admitting: Internal Medicine

## 2012-02-27 ENCOUNTER — Encounter: Payer: Self-pay | Admitting: Internal Medicine

## 2012-02-27 ENCOUNTER — Other Ambulatory Visit (INDEPENDENT_AMBULATORY_CARE_PROVIDER_SITE_OTHER): Payer: Medicare Other

## 2012-02-27 VITALS — BP 140/90 | HR 82 | Ht 72.0 in | Wt 232.6 lb

## 2012-02-27 DIAGNOSIS — N186 End stage renal disease: Secondary | ICD-10-CM | POA: Diagnosis not present

## 2012-02-27 DIAGNOSIS — I2510 Atherosclerotic heart disease of native coronary artery without angina pectoris: Secondary | ICD-10-CM | POA: Diagnosis not present

## 2012-02-27 DIAGNOSIS — R7309 Other abnormal glucose: Secondary | ICD-10-CM | POA: Diagnosis not present

## 2012-02-27 DIAGNOSIS — L299 Pruritus, unspecified: Secondary | ICD-10-CM

## 2012-02-27 DIAGNOSIS — R21 Rash and other nonspecific skin eruption: Secondary | ICD-10-CM

## 2012-02-27 DIAGNOSIS — E785 Hyperlipidemia, unspecified: Secondary | ICD-10-CM | POA: Diagnosis not present

## 2012-02-27 DIAGNOSIS — I120 Hypertensive chronic kidney disease with stage 5 chronic kidney disease or end stage renal disease: Secondary | ICD-10-CM | POA: Diagnosis not present

## 2012-02-27 DIAGNOSIS — R7302 Impaired glucose tolerance (oral): Secondary | ICD-10-CM

## 2012-02-27 DIAGNOSIS — Z951 Presence of aortocoronary bypass graft: Secondary | ICD-10-CM | POA: Diagnosis not present

## 2012-02-27 DIAGNOSIS — I69959 Hemiplegia and hemiparesis following unspecified cerebrovascular disease affecting unspecified side: Secondary | ICD-10-CM | POA: Diagnosis not present

## 2012-02-27 NOTE — Progress Notes (Signed)
02/27/12- 51 yoM never smoker referred by Dr Jonny Ruiz for allergy evaluation of pruritic rash.     Here with sister. He says itching rash began 6 weeks ago and has been persistent, relieved by scratching. It started as fine bumps and then became "puffy patches". He has not significantly improved despite 2 steroid shots and topical cream. He denies any new medications, any change in environmental exposures including laundry products, any prior allergy, any liver disease. Multiple medical problems including hypertension, history of MI and stroke, elevated cholesterol, renal insufficiency and sleep apnea. Coronary bypass graft and stenting. He is divorced, disabled and living with his sister. No family history of urticaria or similar rash.  Prior to Admission medications   Medication Sig Start Date End Date Taking? Authorizing Provider  acetaminophen (TYLENOL) 325 MG tablet Take 650 mg by mouth every 6 (six) hours as needed. For pain   Yes Historical Provider, MD  amLODipine (NORVASC) 10 MG tablet Take 1 tablet (10 mg total) by mouth daily. 02/04/12 02/03/13 Yes Corwin Levins, MD  aspirin EC 81 MG EC tablet Take 1 tablet (81 mg total) by mouth daily. 07/07/11 07/06/12 Yes Wilburt Finlay, PA  cloNIDine (CATAPRES) 0.2 MG tablet Take 0.2 mg by mouth 2 (two) times daily.  05/13/11  Yes Corwin Levins, MD  clopidogrel (PLAVIX) 75 MG tablet Take 1 tablet (75 mg total) by mouth daily. 02/19/12  Yes Fransisco Hertz, MD  febuxostat (ULORIC) 40 MG tablet Take 40 mg by mouth 2 (two) times daily. 05/13/11  Yes Corwin Levins, MD  fluocinonide cream (LIDEX) 0.05 % Apply topically 2 (two) times daily. 02/04/12  Yes Corwin Levins, MD  furosemide (LASIX) 40 MG tablet Take 40 mg by mouth daily.   Yes Historical Provider, MD  hydrALAZINE (APRESOLINE) 10 MG tablet Take 10 mg by mouth 2 (two) times daily.   Yes Historical Provider, MD  hydrOXYzine (ATARAX/VISTARIL) 10 MG tablet Take 10 mg by mouth 2 (two) times daily. For itching   Yes  Historical Provider, MD  isosorbide mononitrate (IMDUR) 60 MG 24 hr tablet Take 1 tablet (60 mg total) by mouth daily. 07/07/11 07/06/12 Yes Wilburt Finlay, PA  metoprolol (LOPRESSOR) 50 MG tablet Take 50 mg by mouth 2 (two) times daily.   Yes Historical Provider, MD  Omega-3 Fatty Acids (FISH OIL) 1200 MG CAPS Take 1 capsule by mouth 3 (three) times daily.   Yes Historical Provider, MD  polysaccharide iron (NIFEREX) 150 MG CAPS capsule Take 150 mg by mouth daily. 05/28/11  Yes Corwin Levins, MD  ranolazine (RANEXA) 500 MG 12 hr tablet Take 1 tablet (500 mg total) by mouth every morning. 07/07/11 07/06/12 Yes Wilburt Finlay, PA  rosuvastatin (CRESTOR) 20 MG tablet Take 20 mg by mouth every evening. 07/07/11 07/06/12 Yes Nada Boozer, NP  terazosin (HYTRIN) 10 MG capsule Take 10 mg by mouth at bedtime. 05/13/11 05/12/12 Yes Corwin Levins, MD  Ticagrelor (BRILINTA) 90 MG TABS tablet Take 1 tablet (90 mg total) by mouth 2 (two) times daily. 07/07/11  Yes Wilburt Finlay, PA   Past Medical History  Diagnosis Date  . ACHALASIA   . CEREBROVASCULAR ACCIDENT, HX OF   . DEGENERATIVE JOINT DISEASE, SHOULDER   . GERD   . Headache   . HYPERLIPIDEMIA   . HYPERTENSION   . RENAL CALCULUS, HX OF   . RENAL INSUFFICIENCY   . H/O hiatal hernia   . CAD (coronary artery disease), 3 vessel significant disease.    Marland Kitchen  Impaired glucose tolerance   . S/P PTCA (percutaneous transluminal coronary angioplasty), 06/30/11 07/01/2011  . CKD (chronic kidney disease) stage 4, GFR 15-29 ml/min   . History of diastolic dysfunction, grade 2 by echo   . S/P coronary artery stent placement, PTCA/DES Resolute to mid-LAD via the LIMA graft, and PTCA of apical 95% stenosis 07/05/11 07/04/2011  . NSTEMI (non-ST elevated myocardial infarction), 06/30/11 07/01/2011  . DEPRESSION   . Sleep apnea     SLEEP STUDY IN [place] , NO MACHINE YET  1 YEAR  . Stroke     93/2005/2006/2007  . Seizures     INSULIN INDUCED  . ANEMIA-NOS    Past Surgical History   Procedure Date  . Tonsillectomy   . Stomach surgery 2009    achalasia  . Knee surgery 01/07/2011    right  . Insertion of dialysis catheter 04/01/2011    Procedure: INSERTION OF DIALYSIS CATHETER;  Surgeon: Nilda Simmer, MD;  Location: [facility] OR;  Service: Vascular;  Laterality: N/A;  . Cardiac catheterization   . Coronary angioplasty     06/30/11   AT [facility]  . Coronary artery bypass graft 04/01/2011    Procedure: CORONARY ARTERY BYPASS GRAFTING (CABG);  Surgeon: Kathlee Nations Suann Larry, MD;  Location[facility] OR;  Service: Open Heart Surgery;  Laterality: N/A;  . Av fistula placement 12/02/2011    Procedure: ARTERIOVENOUS (AV) FISTULA CREATION;  Surgeon: Fransisco Hertz, MD;  Location: [facility] OR;  Service: Vascular;  Laterality: Left;  Ultrasound Guided   Family History  Problem Relation Age of Onset  . Hypertension Other   . Stroke Other   . Hyperlipidemia Other   . Cancer Mother   . Heart disease Mother   . Hyperlipidemia Mother   . Hypertension Mother   . Cancer Father   . Heart disease Father    History   Social History  . Marital Status: Divorced    Spouse Name: N/A    Number of Children: N/A  . Years of Education: N/A   Occupational History  . Not on file.   Social History Main Topics  . Smoking status: Never Smoker   . Smokeless tobacco: Never Used  . Alcohol Use: Yes     Comment: rarely  . Drug Use: No  . Sexually Active: Not on file   Other Topics Concern  . Not on file   Social History Narrative  . No narrative on file   ROS-see HPI Constitutional:   No-   weight loss, night sweats, fevers, chills, fatigue, lassitude. HEENT:   No-  headaches, difficulty swallowing, tooth/dental problems, sore throat,       No-  sneezing, itching, ear ache, nasal congestion, post nasal drip,  CV:  No-   chest pain, orthopnea, PND, swelling in lower extremities, anasarca, dizziness, palpitations Resp: No-   shortness of breath with exertion or at rest.              No-   productive  cough,  No non-productive cough,  No- coughing up of blood.              No-   change in color of mucus.  No- wheezing.   Skin: + per HPI GI:  No-   heartburn, indigestion, abdominal pain, nausea, vomiting, diarrhea,                 change in bowel habits, loss of appetite GU: No-   dysuria, change in color of  urine, no urgency or frequency.  No- flank pain. MS:  No-   joint pain or swelling.  No- decreased range of motion.  No- back pain. Neuro-     nothing unusual Psych:  No- change in mood or affect. No depression or anxiety.  No memory loss.  OBJ- Physical Exam General- Alert, Oriented, Affect-appropriate, Distress- none acute Skin- + dry and excoriated. Several small dry bumps on his back bilaterally. Lymphadenopathy- none Head- atraumatic            Eyes- Gross vision intact, PERRLA, conjunctivae and secretions clear            Ears- Hearing, canals-normal            Nose- Clear, no-Septal dev, mucus, polyps, erosion, perforation             Throat- Mallampati II , mucosa clear , drainage- none, tonsils- atrophic Neck- flexible , trachea midline, no stridor , thyroid nl, carotid no bruit Chest - symmetrical excursion , unlabored           Heart/CV- RRR , no murmur , no gallop  , no rub, nl s1 s2                           - JVD- none , edema- none, stasis changes- none, varices- none           Lung- clear to P&A, wheeze- none, cough- none , dullness-none, rub- none           Chest wall- + healed sternotomy scar Abd- tender-no, distended-no, bowel sounds-present, HSM- no Br/ Gen/ Rectal- Not done, not indicated Extrem- cyanosis- none, clubbing, none, atrophy- none, strength- nl Neuro- grossly intact to observation

## 2012-02-27 NOTE — Patient Instructions (Addendum)
Suggest daily antihistamine Zyrtec/ cetirizine once each night at bedtime  Suggest a skin moisturizing lotion like Cetaphil or Eucerin daily  Order lab- CBC, sed rate, ANA, CMET, Allergy profile      Dx rash

## 2012-02-29 DIAGNOSIS — I2510 Atherosclerotic heart disease of native coronary artery without angina pectoris: Secondary | ICD-10-CM | POA: Diagnosis not present

## 2012-02-29 DIAGNOSIS — I69959 Hemiplegia and hemiparesis following unspecified cerebrovascular disease affecting unspecified side: Secondary | ICD-10-CM | POA: Diagnosis not present

## 2012-02-29 DIAGNOSIS — Z951 Presence of aortocoronary bypass graft: Secondary | ICD-10-CM | POA: Diagnosis not present

## 2012-02-29 DIAGNOSIS — N186 End stage renal disease: Secondary | ICD-10-CM | POA: Diagnosis not present

## 2012-02-29 DIAGNOSIS — E785 Hyperlipidemia, unspecified: Secondary | ICD-10-CM | POA: Diagnosis not present

## 2012-02-29 DIAGNOSIS — I120 Hypertensive chronic kidney disease with stage 5 chronic kidney disease or end stage renal disease: Secondary | ICD-10-CM | POA: Diagnosis not present

## 2012-03-01 DIAGNOSIS — Z951 Presence of aortocoronary bypass graft: Secondary | ICD-10-CM | POA: Diagnosis not present

## 2012-03-01 DIAGNOSIS — N186 End stage renal disease: Secondary | ICD-10-CM | POA: Diagnosis not present

## 2012-03-01 DIAGNOSIS — I69959 Hemiplegia and hemiparesis following unspecified cerebrovascular disease affecting unspecified side: Secondary | ICD-10-CM | POA: Diagnosis not present

## 2012-03-01 DIAGNOSIS — E785 Hyperlipidemia, unspecified: Secondary | ICD-10-CM | POA: Diagnosis not present

## 2012-03-01 DIAGNOSIS — I120 Hypertensive chronic kidney disease with stage 5 chronic kidney disease or end stage renal disease: Secondary | ICD-10-CM | POA: Diagnosis not present

## 2012-03-01 DIAGNOSIS — I2510 Atherosclerotic heart disease of native coronary artery without angina pectoris: Secondary | ICD-10-CM | POA: Diagnosis not present

## 2012-03-03 DIAGNOSIS — Z951 Presence of aortocoronary bypass graft: Secondary | ICD-10-CM | POA: Diagnosis not present

## 2012-03-03 DIAGNOSIS — I69959 Hemiplegia and hemiparesis following unspecified cerebrovascular disease affecting unspecified side: Secondary | ICD-10-CM | POA: Diagnosis not present

## 2012-03-03 DIAGNOSIS — I120 Hypertensive chronic kidney disease with stage 5 chronic kidney disease or end stage renal disease: Secondary | ICD-10-CM | POA: Diagnosis not present

## 2012-03-03 DIAGNOSIS — I2510 Atherosclerotic heart disease of native coronary artery without angina pectoris: Secondary | ICD-10-CM | POA: Diagnosis not present

## 2012-03-03 DIAGNOSIS — N186 End stage renal disease: Secondary | ICD-10-CM | POA: Diagnosis not present

## 2012-03-03 DIAGNOSIS — E785 Hyperlipidemia, unspecified: Secondary | ICD-10-CM | POA: Diagnosis not present

## 2012-03-04 ENCOUNTER — Ambulatory Visit (HOSPITAL_COMMUNITY)
Admission: RE | Admit: 2012-03-04 | Discharge: 2012-03-04 | Disposition: A | Discharge status: Home / Self Care | Payer: Medicare Other | Source: Ambulatory Visit | Attending: Cardiovascular Disease | Admitting: Cardiovascular Disease

## 2012-03-04 DIAGNOSIS — I2510 Atherosclerotic heart disease of native coronary artery without angina pectoris: Secondary | ICD-10-CM | POA: Diagnosis not present

## 2012-03-04 DIAGNOSIS — I10 Essential (primary) hypertension: Secondary | ICD-10-CM | POA: Diagnosis not present

## 2012-03-04 DIAGNOSIS — Z951 Presence of aortocoronary bypass graft: Secondary | ICD-10-CM | POA: Insufficient documentation

## 2012-03-04 DIAGNOSIS — I252 Old myocardial infarction: Secondary | ICD-10-CM | POA: Insufficient documentation

## 2012-03-04 DIAGNOSIS — I214 Non-ST elevation (NSTEMI) myocardial infarction: Secondary | ICD-10-CM

## 2012-03-04 DIAGNOSIS — Z8679 Personal history of other diseases of the circulatory system: Secondary | ICD-10-CM

## 2012-03-04 NOTE — Progress Notes (Signed)
[facility]   2D echo completed 03/04/2012.   Veda Canning, RDCS

## 2012-03-05 ENCOUNTER — Telehealth: Payer: Self-pay | Admitting: Internal Medicine

## 2012-03-05 DIAGNOSIS — I2510 Atherosclerotic heart disease of native coronary artery without angina pectoris: Secondary | ICD-10-CM | POA: Diagnosis not present

## 2012-03-05 DIAGNOSIS — N186 End stage renal disease: Secondary | ICD-10-CM | POA: Diagnosis not present

## 2012-03-05 DIAGNOSIS — I69959 Hemiplegia and hemiparesis following unspecified cerebrovascular disease affecting unspecified side: Secondary | ICD-10-CM | POA: Diagnosis not present

## 2012-03-05 DIAGNOSIS — I120 Hypertensive chronic kidney disease with stage 5 chronic kidney disease or end stage renal disease: Secondary | ICD-10-CM | POA: Diagnosis not present

## 2012-03-05 DIAGNOSIS — E785 Hyperlipidemia, unspecified: Secondary | ICD-10-CM | POA: Diagnosis not present

## 2012-03-05 DIAGNOSIS — Z951 Presence of aortocoronary bypass graft: Secondary | ICD-10-CM | POA: Diagnosis not present

## 2012-03-05 NOTE — Telephone Encounter (Signed)
Pt did not have skin testing done. Also pt did not have labs done from OV that Dr. Maple Hudson ordered.   lmomtcb x1

## 2012-03-05 NOTE — Telephone Encounter (Signed)
Spoke with Aram Beecham (lab manager)-lab tech did not release correct labs therefore wrong labs were drawn; orders from [facility] were never released. Aram Beecham will make sure patient is not charged for lab draw on Monday as pt will come back Monday 03-08-12 to have correct labs draw. Pt aware that we will reach him with lab results once we get them back.

## 2012-03-05 NOTE — Telephone Encounter (Signed)
Called and spoke with pts sister and she wanted to know the results of the labs from 12-13 ov with [facility].  These labs were not done but the A!C was done.  pts sister stated that the lotion and meds that CY recs are not working and wanted to know if they could do the skin testing next week or does [facility] have any further recs.  Please advise. Thanks  Allergies  Allergen Reactions  . Shrimp (Shellfish Allergy) Shortness Of Breath  . Atorvastatin Other (See Comments)    REACTION: weakness  . Insulins Other (See Comments)    Patient unsure as to the kind of insulin but states that it has previously caused seizures. This occurred in the hospital in 2006; but in most recent hospitalization (Jan 2013) received insulin but did not have reaction  . Simvastatin Other (See Comments)    REACTION: abnormal liver tests  . Ultram (Tramadol)

## 2012-03-07 NOTE — Assessment & Plan Note (Signed)
.   Nonspecific rash. The first suspicion will always be medication. It may not be allergic. Plan- chemistry, CBC, ANA, sedimentation rate, allergy profile Maintenance antihistamine like Allegra. Moisturizing skin lotion like Cetaphil or Eucerin. If rash fails to clear, consider dermatologist/biopsy

## 2012-03-08 ENCOUNTER — Telehealth: Payer: Self-pay | Admitting: Internal Medicine

## 2012-03-08 DIAGNOSIS — I2510 Atherosclerotic heart disease of native coronary artery without angina pectoris: Secondary | ICD-10-CM | POA: Diagnosis not present

## 2012-03-08 DIAGNOSIS — Z951 Presence of aortocoronary bypass graft: Secondary | ICD-10-CM | POA: Diagnosis not present

## 2012-03-08 DIAGNOSIS — N186 End stage renal disease: Secondary | ICD-10-CM | POA: Diagnosis not present

## 2012-03-08 DIAGNOSIS — E785 Hyperlipidemia, unspecified: Secondary | ICD-10-CM | POA: Diagnosis not present

## 2012-03-08 DIAGNOSIS — I69959 Hemiplegia and hemiparesis following unspecified cerebrovascular disease affecting unspecified side: Secondary | ICD-10-CM | POA: Diagnosis not present

## 2012-03-08 DIAGNOSIS — L299 Pruritus, unspecified: Secondary | ICD-10-CM

## 2012-03-08 DIAGNOSIS — I120 Hypertensive chronic kidney disease with stage 5 chronic kidney disease or end stage renal disease: Secondary | ICD-10-CM | POA: Diagnosis not present

## 2012-03-08 NOTE — Telephone Encounter (Signed)
Urgent referral sent to [facility] Pt aware

## 2012-03-08 NOTE — Telephone Encounter (Signed)
Order - referral to [facility] dermatology- dx itching rash. (Some insurance may require that his primary make the referral)

## 2012-03-08 NOTE — Telephone Encounter (Signed)
Spoke with pt He is asking for referral to derm States that his rash and itching have become unbearable and nothing he has tried so far helps Please advise, thanks!

## 2012-03-11 DIAGNOSIS — N186 End stage renal disease: Secondary | ICD-10-CM | POA: Diagnosis not present

## 2012-03-11 DIAGNOSIS — I120 Hypertensive chronic kidney disease with stage 5 chronic kidney disease or end stage renal disease: Secondary | ICD-10-CM | POA: Diagnosis not present

## 2012-03-11 DIAGNOSIS — I2510 Atherosclerotic heart disease of native coronary artery without angina pectoris: Secondary | ICD-10-CM | POA: Diagnosis not present

## 2012-03-11 DIAGNOSIS — E785 Hyperlipidemia, unspecified: Secondary | ICD-10-CM | POA: Diagnosis not present

## 2012-03-11 DIAGNOSIS — Z951 Presence of aortocoronary bypass graft: Secondary | ICD-10-CM | POA: Diagnosis not present

## 2012-03-11 DIAGNOSIS — I69959 Hemiplegia and hemiparesis following unspecified cerebrovascular disease affecting unspecified side: Secondary | ICD-10-CM | POA: Diagnosis not present

## 2012-03-15 ENCOUNTER — Other Ambulatory Visit (INDEPENDENT_AMBULATORY_CARE_PROVIDER_SITE_OTHER): Payer: Medicare Other

## 2012-03-15 ENCOUNTER — Telehealth: Payer: Self-pay | Admitting: *Deleted

## 2012-03-15 DIAGNOSIS — R21 Rash and other nonspecific skin eruption: Secondary | ICD-10-CM | POA: Diagnosis not present

## 2012-03-15 LAB — CBC
HCT: 37.8 % — ABNORMAL LOW (ref 39.0–52.0)
Hemoglobin: 12.6 g/dL — ABNORMAL LOW (ref 13.0–17.0)
MCHC: 33.5 g/dL (ref 30.0–36.0)
MCV: 97.3 fl (ref 78.0–100.0)
Platelets: 260 10*3/uL (ref 150.0–400.0)
RBC: 3.88 Mil/uL — ABNORMAL LOW (ref 4.22–5.81)
RDW: 15.1 % — ABNORMAL HIGH (ref 11.5–14.6)
WBC: 8.2 10*3/uL (ref 4.5–10.5)

## 2012-03-15 LAB — COMPREHENSIVE METABOLIC PANEL
ALT: 44 U/L (ref 0–53)
AST: 25 U/L (ref 0–37)
Albumin: 4.4 g/dL (ref 3.5–5.2)
Calcium: 9.5 mg/dL (ref 8.4–10.5)
Chloride: 104 mEq/L (ref 96–112)
Potassium: 3.5 mEq/L (ref 3.5–5.1)

## 2012-03-15 LAB — SEDIMENTATION RATE: Sed Rate: 17 mm/hr (ref 0–22)

## 2012-03-15 NOTE — Telephone Encounter (Signed)
[place] lab called to let us know if a critical creatine of 4.72 on this pt. Thanks.

## 2012-03-15 NOTE — Telephone Encounter (Signed)
This is in his long-term creatinine range. I have forwarded notice to his PCP.

## 2012-03-16 ENCOUNTER — Telehealth: Payer: Self-pay | Admitting: *Deleted

## 2012-03-16 ENCOUNTER — Other Ambulatory Visit: Payer: Self-pay | Admitting: *Deleted

## 2012-03-16 DIAGNOSIS — I69959 Hemiplegia and hemiparesis following unspecified cerebrovascular disease affecting unspecified side: Secondary | ICD-10-CM | POA: Diagnosis not present

## 2012-03-16 DIAGNOSIS — I2510 Atherosclerotic heart disease of native coronary artery without angina pectoris: Secondary | ICD-10-CM | POA: Diagnosis not present

## 2012-03-16 DIAGNOSIS — I6350 Cerebral infarction due to unspecified occlusion or stenosis of unspecified cerebral artery: Secondary | ICD-10-CM | POA: Diagnosis not present

## 2012-03-16 DIAGNOSIS — Z951 Presence of aortocoronary bypass graft: Secondary | ICD-10-CM | POA: Diagnosis not present

## 2012-03-16 DIAGNOSIS — N186 End stage renal disease: Secondary | ICD-10-CM | POA: Diagnosis not present

## 2012-03-16 DIAGNOSIS — I70209 Unspecified atherosclerosis of native arteries of extremities, unspecified extremity: Secondary | ICD-10-CM

## 2012-03-16 DIAGNOSIS — I120 Hypertensive chronic kidney disease with stage 5 chronic kidney disease or end stage renal disease: Secondary | ICD-10-CM | POA: Diagnosis not present

## 2012-03-16 DIAGNOSIS — E785 Hyperlipidemia, unspecified: Secondary | ICD-10-CM | POA: Diagnosis not present

## 2012-03-16 LAB — ALLERGY FULL PROFILE
Alternaria Alternata: 0.11 kU/L — ABNORMAL HIGH
Bermuda Grass: 3.43 kU/L — ABNORMAL HIGH
Candida Albicans: 0.61 kU/L — ABNORMAL HIGH
Curvularia lunata: <0.1 kU/L
Elm IgE: <0.1 kU/L
Fescue: 0.12 kU/L — ABNORMAL HIGH
G009 Red Top: 0.1 kU/L — ABNORMAL HIGH
Lamb's Quarters: 0.21 kU/L — ABNORMAL HIGH
Oak: 0.16 kU/L — ABNORMAL HIGH
Timothy Grass: 0.14 kU/L — ABNORMAL HIGH

## 2012-03-16 LAB — ANA: Anti Nuclear Antibody(ANA): NEGATIVE

## 2012-03-16 MED ORDER — CLOPIDOGREL BISULFATE 75 MG PO TABS: 75.0000 mg | ORAL_TABLET | Freq: Every day | ORAL | Status: DC

## 2012-03-16 NOTE — Telephone Encounter (Signed)
PATIENT CALLED TO GET REFILL ON MEDICATION , AMBIEN, CALLED IN TO [facility], PLEASE ADVISE ON PATIENT OF DR. Jonny Ruiz.  LAST OV 02/14/2012. SUE

## 2012-03-16 NOTE — Telephone Encounter (Signed)
OK to fill this prescription with additional refills x0 Thank you!  

## 2012-03-17 DIAGNOSIS — I69959 Hemiplegia and hemiparesis following unspecified cerebrovascular disease affecting unspecified side: Secondary | ICD-10-CM | POA: Diagnosis not present

## 2012-03-17 DIAGNOSIS — I2510 Atherosclerotic heart disease of native coronary artery without angina pectoris: Secondary | ICD-10-CM | POA: Diagnosis not present

## 2012-03-17 DIAGNOSIS — E785 Hyperlipidemia, unspecified: Secondary | ICD-10-CM | POA: Diagnosis not present

## 2012-03-17 DIAGNOSIS — Z951 Presence of aortocoronary bypass graft: Secondary | ICD-10-CM | POA: Diagnosis not present

## 2012-03-17 DIAGNOSIS — I120 Hypertensive chronic kidney disease with stage 5 chronic kidney disease or end stage renal disease: Secondary | ICD-10-CM | POA: Diagnosis not present

## 2012-03-17 DIAGNOSIS — N186 End stage renal disease: Secondary | ICD-10-CM | POA: Diagnosis not present

## 2012-03-18 ENCOUNTER — Other Ambulatory Visit: Payer: Self-pay | Admitting: *Deleted

## 2012-03-18 DIAGNOSIS — N186 End stage renal disease: Secondary | ICD-10-CM | POA: Diagnosis not present

## 2012-03-18 DIAGNOSIS — I120 Hypertensive chronic kidney disease with stage 5 chronic kidney disease or end stage renal disease: Secondary | ICD-10-CM | POA: Diagnosis not present

## 2012-03-18 DIAGNOSIS — E785 Hyperlipidemia, unspecified: Secondary | ICD-10-CM | POA: Diagnosis not present

## 2012-03-18 DIAGNOSIS — I70209 Unspecified atherosclerosis of native arteries of extremities, unspecified extremity: Secondary | ICD-10-CM

## 2012-03-18 DIAGNOSIS — L98499 Non-pressure chronic ulcer of skin of other sites with unspecified severity: Secondary | ICD-10-CM

## 2012-03-18 DIAGNOSIS — Z951 Presence of aortocoronary bypass graft: Secondary | ICD-10-CM | POA: Diagnosis not present

## 2012-03-18 DIAGNOSIS — I2510 Atherosclerotic heart disease of native coronary artery without angina pectoris: Secondary | ICD-10-CM | POA: Diagnosis not present

## 2012-03-18 DIAGNOSIS — I69959 Hemiplegia and hemiparesis following unspecified cerebrovascular disease affecting unspecified side: Secondary | ICD-10-CM | POA: Diagnosis not present

## 2012-03-18 MED ORDER — CLOPIDOGREL BISULFATE 75 MG PO TABS: 75.0000 mg | ORAL_TABLET | Freq: Every day | ORAL | Status: DC

## 2012-03-18 NOTE — Telephone Encounter (Signed)
Did not fill this patient request for sleeping medication due to not on patient current med list and patient stated he needed something to sleep due to back pain and not sleeping that makes his legs buckle under him in the mornings. Please advise

## 2012-03-18 NOTE — Progress Notes (Signed)
Quick Note:  Pt aware of results. ______ 

## 2012-03-18 NOTE — Progress Notes (Signed)
Quick Note:  LMTCB ______ 

## 2012-03-19 ENCOUNTER — Telehealth: Payer: Self-pay | Admitting: Internal Medicine

## 2012-03-19 MED ORDER — ZOLPIDEM TARTRATE 10 MG PO TABS: 10.0000 mg | ORAL_TABLET | Freq: Every evening | ORAL | Status: DC | PRN

## 2012-03-19 NOTE — Telephone Encounter (Signed)
Done hardcopy to [facility]  

## 2012-03-19 NOTE — Telephone Encounter (Signed)
States Remus Loffler was approved but was not called into pharmacy

## 2012-03-19 NOTE — Telephone Encounter (Signed)
Faxed hardcopy to [facility] and called the patient to inform

## 2012-03-23 DIAGNOSIS — Z951 Presence of aortocoronary bypass graft: Secondary | ICD-10-CM | POA: Diagnosis not present

## 2012-03-23 DIAGNOSIS — N186 End stage renal disease: Secondary | ICD-10-CM | POA: Diagnosis not present

## 2012-03-23 DIAGNOSIS — I2510 Atherosclerotic heart disease of native coronary artery without angina pectoris: Secondary | ICD-10-CM | POA: Diagnosis not present

## 2012-03-23 DIAGNOSIS — I69959 Hemiplegia and hemiparesis following unspecified cerebrovascular disease affecting unspecified side: Secondary | ICD-10-CM | POA: Diagnosis not present

## 2012-03-23 DIAGNOSIS — E785 Hyperlipidemia, unspecified: Secondary | ICD-10-CM | POA: Diagnosis not present

## 2012-03-23 DIAGNOSIS — I120 Hypertensive chronic kidney disease with stage 5 chronic kidney disease or end stage renal disease: Secondary | ICD-10-CM | POA: Diagnosis not present

## 2012-03-24 DIAGNOSIS — Z951 Presence of aortocoronary bypass graft: Secondary | ICD-10-CM | POA: Diagnosis not present

## 2012-03-24 DIAGNOSIS — E785 Hyperlipidemia, unspecified: Secondary | ICD-10-CM | POA: Diagnosis not present

## 2012-03-24 DIAGNOSIS — I69959 Hemiplegia and hemiparesis following unspecified cerebrovascular disease affecting unspecified side: Secondary | ICD-10-CM | POA: Diagnosis not present

## 2012-03-24 DIAGNOSIS — I120 Hypertensive chronic kidney disease with stage 5 chronic kidney disease or end stage renal disease: Secondary | ICD-10-CM | POA: Diagnosis not present

## 2012-03-24 DIAGNOSIS — I2510 Atherosclerotic heart disease of native coronary artery without angina pectoris: Secondary | ICD-10-CM | POA: Diagnosis not present

## 2012-03-24 DIAGNOSIS — N186 End stage renal disease: Secondary | ICD-10-CM | POA: Diagnosis not present

## 2012-03-25 ENCOUNTER — Encounter: Payer: Self-pay | Admitting: Vascular Surgery

## 2012-03-26 ENCOUNTER — Ambulatory Visit (INDEPENDENT_AMBULATORY_CARE_PROVIDER_SITE_OTHER): Payer: Medicare Other | Admitting: Vascular Surgery

## 2012-03-26 ENCOUNTER — Encounter: Payer: Self-pay | Admitting: Vascular Surgery

## 2012-03-26 VITALS — BP 116/65 | HR 62 | Ht 72.0 in | Wt 244.0 lb

## 2012-03-26 DIAGNOSIS — Z4931 Encounter for adequacy testing for hemodialysis: Secondary | ICD-10-CM

## 2012-03-26 DIAGNOSIS — N186 End stage renal disease: Secondary | ICD-10-CM

## 2012-03-26 DIAGNOSIS — I771 Stricture of artery: Secondary | ICD-10-CM | POA: Insufficient documentation

## 2012-03-26 NOTE — Addendum Note (Signed)
Addended by: Sharee Pimple on: 03/26/2012 12:38 PM   Modules accepted: Orders

## 2012-03-26 NOTE — Progress Notes (Addendum)
[facility] [place]  Postoperative Access Visit  History of Present Illness  [name] is 52 y.o. male who presents for postoperative follow-up for: stenting of L SCA to improve left RC AVF maturation (Date: 02/19/12).  The patient has had no complication from cannulation.  The patient notes no steal symptoms.  The patient is able to complete their activities of daily living.  The patient's current symptoms are: none.  His leg weakness and L arm weakness are improved and felt by Neurology to be related to prior CVA  Physical Examination  There were no vitals filed for this visit. R groin: no hematoma or PSA LUE: skin feels warm, hand grip is 5/5, sensation in digits is intact, palpable thrill, bruit can be auscultated   Medical Decision Making  [name] is 52 y.o. male who presents s/p L RC AVF.  The patient will follow up with [place] in 4 weeks for a formal access duplex.  Hopefully with the additional blood flow into the fistula, it will be able to mature with some additional time.  Thank you for allowing [place] to participate in this patient's care.  Leonides Sake, MD [facility] [place] Office: [phone] Pager: [phone]

## 2012-03-30 DIAGNOSIS — D649 Anemia, unspecified: Secondary | ICD-10-CM | POA: Diagnosis not present

## 2012-03-30 DIAGNOSIS — I129 Hypertensive chronic kidney disease with stage 1 through stage 4 chronic kidney disease, or unspecified chronic kidney disease: Secondary | ICD-10-CM | POA: Diagnosis not present

## 2012-03-30 DIAGNOSIS — N2581 Secondary hyperparathyroidism of renal origin: Secondary | ICD-10-CM | POA: Diagnosis not present

## 2012-03-30 DIAGNOSIS — L259 Unspecified contact dermatitis, unspecified cause: Secondary | ICD-10-CM | POA: Diagnosis not present

## 2012-03-30 DIAGNOSIS — D509 Iron deficiency anemia, unspecified: Secondary | ICD-10-CM | POA: Diagnosis not present

## 2012-03-30 DIAGNOSIS — N184 Chronic kidney disease, stage 4 (severe): Secondary | ICD-10-CM | POA: Diagnosis not present

## 2012-04-07 ENCOUNTER — Ambulatory Visit: Payer: Medicare Other | Admitting: Internal Medicine

## 2012-04-22 ENCOUNTER — Encounter: Payer: Self-pay | Admitting: Vascular Surgery

## 2012-04-23 ENCOUNTER — Encounter: Payer: Self-pay | Admitting: Vascular Surgery

## 2012-04-23 ENCOUNTER — Encounter (INDEPENDENT_AMBULATORY_CARE_PROVIDER_SITE_OTHER): Payer: Medicare Other | Admitting: *Deleted

## 2012-04-23 ENCOUNTER — Ambulatory Visit (INDEPENDENT_AMBULATORY_CARE_PROVIDER_SITE_OTHER): Payer: Medicare Other | Admitting: Vascular Surgery

## 2012-04-23 VITALS — BP 126/78 | HR 68 | Ht 72.0 in | Wt 244.0 lb

## 2012-04-23 DIAGNOSIS — T82598A Other mechanical complication of other cardiac and vascular devices and implants, initial encounter: Secondary | ICD-10-CM | POA: Diagnosis not present

## 2012-04-23 DIAGNOSIS — Z4931 Encounter for adequacy testing for hemodialysis: Secondary | ICD-10-CM

## 2012-04-23 DIAGNOSIS — N186 End stage renal disease: Secondary | ICD-10-CM

## 2012-04-23 DIAGNOSIS — N184 Chronic kidney disease, stage 4 (severe): Secondary | ICD-10-CM | POA: Diagnosis not present

## 2012-04-23 NOTE — Progress Notes (Signed)
[facility] [place]  Established Dialysis Access  History of Present Illness  [name] is a 52 y.o. (19-Apr-1960) male who presents for re-evaluation of left radiocephalic fistula.  Pt underwent a PTA+S of L SCA on 02/19/12 to improve inflow to the left arm.  The patient notes he still not ESRD.  He believes he has a 6 month window before becoming such.  He denies any complications from the procedure.  Past Medical History, Past Surgical History, Social History, Family History, Medications, Allergies, and Review of Systems are unchanged from previous visit on 03/26/12.  Physical Examination  Filed Vitals:   04/23/12 1113  BP: 126/78  Pulse: 68  Height: 6' (1.829 m)  Weight: 244 lb (110.678 kg)  SpO2: 100%   Body mass index is 33.09 kg/(m^2).  General: A&O x 3, WDWN  Pulmonary: Sym exp, good air movt, CTAB, no rales, rhonchi, & wheezing  Cardiac: RRR, Nl S1, S2, no Murmurs, rubs or gallops  Gastrointestinal: soft, NTND, -G/R, - HSM, - masses, - CVAT B  Musculoskeletal: M/S 5/5 throughout , Extremities without  ischemic changes , L RC AVF with strong distal thrill and bruit, can palpate thrill at proximal antecubitum, cannot palpate thrill in mid-segment  Neurologic: Pain and light touch intact in extremities , Motor exam as listed above  Non-Invasive Vascular Imaging  L arm access duplex  (Date: 04/23/12):   Diameter 1.8-5.2 mm  Depth: 2-5 mm dep  Medical Decision Making  [name] is a 52 y.o. male who presents with chronic kidney disease stage IV-V   During his L SCV stenting, I got a good fistulogram which demonstrated a good circuit within the fistula but not maturing vein.  On exam, the fistula is not large enough to be easily cannulated.  The patient is reluctant to proceed with any further intervention yet.  We agreed to give the fistula one month.  If it is not large enough in one month, either a side branch ligation of the RC  AVF needs to be done or a new L BC AVF placement and ligation of L RC AVF.  The patient will follow up in one month to decide.  Leonides Sake, MD [facility] [place] Office: [phone] Pager: [phone]  04/23/2012, 12:04 PM

## 2012-05-11 ENCOUNTER — Encounter: Payer: Self-pay | Admitting: Internal Medicine

## 2012-05-11 ENCOUNTER — Ambulatory Visit (INDEPENDENT_AMBULATORY_CARE_PROVIDER_SITE_OTHER): Payer: Medicare Other | Admitting: Internal Medicine

## 2012-05-11 VITALS — BP 108/60 | HR 67 | Temp 98.0°F | Ht 72.0 in | Wt 231.0 lb

## 2012-05-11 DIAGNOSIS — Z8673 Personal history of transient ischemic attack (TIA), and cerebral infarction without residual deficits: Secondary | ICD-10-CM | POA: Diagnosis not present

## 2012-05-11 DIAGNOSIS — R29898 Other symptoms and signs involving the musculoskeletal system: Secondary | ICD-10-CM

## 2012-05-11 DIAGNOSIS — I10 Essential (primary) hypertension: Secondary | ICD-10-CM | POA: Diagnosis not present

## 2012-05-11 DIAGNOSIS — R7309 Other abnormal glucose: Secondary | ICD-10-CM

## 2012-05-11 DIAGNOSIS — R7302 Impaired glucose tolerance (oral): Secondary | ICD-10-CM

## 2012-05-11 NOTE — Assessment & Plan Note (Addendum)
Suspect recent subjective worsening and tendency to buckle and fall likely related to deconditioning;  Will ask for Home PT re-eval, but the walker with seat is likely best option for him, Please continue all other medications as before,  Note:  Total time for pt hx, exam, review of record with pt in the room, determination of diagnoses and plan for further eval and tx is > 40 min, with over 50% spent in coordination and counseling of patient

## 2012-05-11 NOTE — Progress Notes (Signed)
Subjective:    Patient ID: [name], male    DOB: 04-Jul-1960, 52 y.o.   MRN: [id number]  HPI  Here to f/u with cc of worsening LLE weakness ,  Had PT to the home in November, seemed to improved but best function was being able to walk still requiring assist and walker (cane not enough support), since then still trying to some of the excercises and seems to be getting worse, now with worsening left sided weakness, stands only a couple of minutes at most, left leg starts to tremble and buckles/givesaway, No pain or numbness.  No clear weakness otherwise to LUE or right side.   Pt denies fever, wt loss, night sweats, loss of appetite, or other constitutional symptoms, wt overall stable, was 233 at renal appt last mo per sister.  Overall stable renal fxn, left arm fistula maturing, no set date now to start dialysis.  Pt denies chest pain, increased sob or doe, wheezing, orthopnea, PND, increased LE swelling, palpitations, dizziness or syncope.  powerchair not practical for home use due to stairs in the home.  Sister asking for walker with seat.   Pt denies polydipsia, polyuria. Past Medical History  Diagnosis Date  . ACHALASIA   . CEREBROVASCULAR ACCIDENT, HX OF   . DEGENERATIVE JOINT DISEASE, SHOULDER   . GERD   . Headache   . HYPERLIPIDEMIA   . HYPERTENSION   . RENAL CALCULUS, HX OF   . RENAL INSUFFICIENCY   . H/O hiatal hernia   . CAD (coronary artery disease), 3 vessel significant disease.    . Impaired glucose tolerance   . S/P PTCA (percutaneous transluminal coronary angioplasty), 06/30/11 07/01/2011  . CKD (chronic kidney disease) stage 4, GFR 15-29 ml/min   . History of diastolic dysfunction, grade 2 by echo   . S/P coronary artery stent placement, PTCA/DES Resolute to mid-LAD via the LIMA graft, and PTCA of apical 95% stenosis 07/05/11 07/04/2011  . NSTEMI (non-ST elevated myocardial infarction), 06/30/11 07/01/2011  . DEPRESSION   . Sleep apnea     SLEEP STUDY IN [place] , NO MACHINE  YET  1 YEAR  . Stroke     93/2005/2006/2007  . Seizures     INSULIN INDUCED  . ANEMIA-NOS    Past Surgical History  Procedure Laterality Date  . Tonsillectomy    . Stomach surgery  2009    achalasia  . Knee surgery  01/07/2011    right  . Insertion of dialysis catheter  04/01/2011    Procedure: INSERTION OF DIALYSIS CATHETER;  Surgeon: Nilda Simmer, MD;  Location: [facility] OR;  Service: Vascular;  Laterality: N/A;  . Cardiac catheterization    . Coronary angioplasty      06/30/11   AT [facility]  . Coronary artery bypass graft  04/01/2011    Procedure: CORONARY ARTERY BYPASS GRAFTING (CABG);  Surgeon: Kathlee Nations Suann Larry, MD;  Location: [facility] OR;  Service: Open Heart Surgery;  Laterality: N/A;  . Av fistula placement  12/02/2011    Procedure: ARTERIOVENOUS (AV) FISTULA CREATION;  Surgeon: Fransisco Hertz, MD;  Location: [facility] OR;  Service: Vascular;  Laterality: Left;  Ultrasound Guided  . Subclavian stent placement  12/20/2011    reports that he has never smoked. He has never used smokeless tobacco. He reports that  drinks alcohol. He reports that he does not use illicit drugs. family history includes Cancer in his father and mother; Heart disease in his father and mother; Hyperlipidemia in his mother  and other; Hypertension in his mother and other; and Stroke in his other. Allergies  Allergen Reactions  . Shrimp (Shellfish Allergy) Shortness Of Breath  . Atorvastatin Other (See Comments)    REACTION: weakness  . Insulins Other (See Comments)    Patient unsure as to the kind of insulin but states that it has previously caused seizures. This occurred in the hospital in 2006; but in most recent hospitalization (Jan 2013) received insulin but did not have reaction  . Simvastatin Other (See Comments)    REACTION: abnormal liver tests  . Ultram (Tramadol)    Current Outpatient Prescriptions on File Prior to Visit  Medication Sig Dispense Refill  . acetaminophen (TYLENOL) 325 MG tablet Take 650 mg by  mouth every 6 (six) hours as needed. For pain      . amLODipine (NORVASC) 10 MG tablet Take 1 tablet (10 mg total) by mouth daily.  90 tablet  3  . aspirin EC 81 MG EC tablet Take 1 tablet (81 mg total) by mouth daily.      . cloNIDine (CATAPRES) 0.2 MG tablet Take 0.2 mg by mouth 2 (two) times daily.       . clopidogrel (PLAVIX) 75 MG tablet Take 1 tablet (75 mg total) by mouth daily.  30 tablet  3  . febuxostat (ULORIC) 40 MG tablet Take 40 mg by mouth 2 (two) times daily.      . fluocinonide cream (LIDEX) 0.05 % Apply topically 2 (two) times daily.  30 g  1  . furosemide (LASIX) 40 MG tablet Take 40 mg by mouth daily.      . hydrALAZINE (APRESOLINE) 10 MG tablet Take 10 mg by mouth 2 (two) times daily.      . hydrOXYzine (ATARAX/VISTARIL) 10 MG tablet Take 10 mg by mouth 2 (two) times daily. For itching      . isosorbide mononitrate (IMDUR) 60 MG 24 hr tablet Take 1 tablet (60 mg total) by mouth daily.  30 tablet  6  . metoprolol (LOPRESSOR) 50 MG tablet Take 50 mg by mouth 2 (two) times daily.      . Omega-3 Fatty Acids (FISH OIL) 1200 MG CAPS Take 1 capsule by mouth 3 (three) times daily.      . permethrin (ELIMITE) 5 % cream       . polysaccharide iron (NIFEREX) 150 MG CAPS capsule Take 150 mg by mouth daily.      . ranolazine (RANEXA) 500 MG 12 hr tablet Take 1 tablet (500 mg total) by mouth every morning.  30 tablet  6  . rosuvastatin (CRESTOR) 20 MG tablet Take 20 mg by mouth every evening.      . terazosin (HYTRIN) 10 MG capsule Take 10 mg by mouth at bedtime.      . Ticagrelor (BRILINTA) 90 MG TABS tablet Take 1 tablet (90 mg total) by mouth 2 (two) times daily.  60 tablet  11  . triamcinolone cream (KENALOG) 0.1 %       . zolpidem (AMBIEN) 10 MG tablet Take 1 tablet (10 mg total) by mouth at bedtime as needed for sleep.  30 tablet  5   No current facility-administered medications on file prior to visit.   Review of Systems  Constitutional: Negative for unexpected weight change,  or unusual diaphoresis  HENT: Negative for tinnitus.   Eyes: Negative for photophobia and visual disturbance.  Respiratory: Negative for choking and stridor.   Gastrointestinal: Negative for vomiting and blood in stool.  Genitourinary: Negative for hematuria and decreased urine volume.  Musculoskeletal: Negative for acute joint swelling Skin: Negative for color change and wound.  Neurological: Negative for tremors and numbness other than noted  Psychiatric/Behavioral: Negative for depressed mood or panic     Objective:   Physical Exam BP 108/60  Pulse 67  Temp(Src) 98 F (36.7 C) (Oral)  Ht 6' (1.829 m)  Wt 231 lb (104.781 kg)  BMI 31.32 kg/m2  SpO2 99% VS noted,  Constitutional: Pt appears well-developed and well-nourished.  HENT: Head: NCAT.  Right Ear: External ear normal.  Left Ear: External ear normal.  Eyes: Conjunctivae and EOM are normal. Pupils are equal, round, and reactive to light.  Neck: Normal range of motion. Neck supple.  Cardiovascular: Normal rate and regular rhythm.   Pulmonary/Chest: Effort normal and breath sounds normal.  Abd:  Soft, NT, non-distended, + BS Neurological: Pt is alert. Appears about baseline confusion, has 4-4+/5 RUE/RLE weakness Skin: Skin is warm. No erythema.  Psychiatric: Pt behavior is normal.not depressed affect.     Assessment & Plan:

## 2012-05-11 NOTE — Patient Instructions (Signed)
Please continue all other medications as before, and refills have been done if requested. Please have the pharmacy call with any other refills you may need. You will be contacted regarding the referral for: home physical therapy through [facility] You are given the prescription for the walker with seat (this is most likely best unless the PT has better option to offer) Please keep your appointments with your specialists as you have planned Please return in 6 months, or sooner if needed

## 2012-05-11 NOTE — Assessment & Plan Note (Signed)
stable overall by history and exam, recent data reviewed with pt, and pt to continue medical treatment as before,  to f/u any worsening symptoms or concerns BP Readings from Last 3 Encounters:  05/11/12 108/60  04/23/12 126/78  03/26/12 116/65

## 2012-05-11 NOTE — Assessment & Plan Note (Signed)
stable overall by history and exam, recent data reviewed with pt, and pt to continue medical treatment as before,  to f/u any worsening symptoms or concerns Lab Results  Component Value Date   HGBA1C 6.2 02/27/2012

## 2012-05-20 ENCOUNTER — Encounter: Payer: Self-pay | Admitting: Vascular Surgery

## 2012-05-20 DIAGNOSIS — Z992 Dependence on renal dialysis: Secondary | ICD-10-CM | POA: Diagnosis not present

## 2012-05-20 DIAGNOSIS — I129 Hypertensive chronic kidney disease with stage 1 through stage 4 chronic kidney disease, or unspecified chronic kidney disease: Secondary | ICD-10-CM | POA: Diagnosis not present

## 2012-05-20 DIAGNOSIS — N184 Chronic kidney disease, stage 4 (severe): Secondary | ICD-10-CM | POA: Diagnosis not present

## 2012-05-20 DIAGNOSIS — Z9181 History of falling: Secondary | ICD-10-CM | POA: Diagnosis not present

## 2012-05-20 DIAGNOSIS — Z7982 Long term (current) use of aspirin: Secondary | ICD-10-CM | POA: Diagnosis not present

## 2012-05-20 DIAGNOSIS — I69993 Ataxia following unspecified cerebrovascular disease: Secondary | ICD-10-CM | POA: Diagnosis not present

## 2012-05-21 ENCOUNTER — Ambulatory Visit: Payer: Medicare Other | Admitting: Vascular Surgery

## 2012-05-24 DIAGNOSIS — Z9181 History of falling: Secondary | ICD-10-CM | POA: Diagnosis not present

## 2012-05-24 DIAGNOSIS — N184 Chronic kidney disease, stage 4 (severe): Secondary | ICD-10-CM | POA: Diagnosis not present

## 2012-05-24 DIAGNOSIS — I129 Hypertensive chronic kidney disease with stage 1 through stage 4 chronic kidney disease, or unspecified chronic kidney disease: Secondary | ICD-10-CM | POA: Diagnosis not present

## 2012-05-24 DIAGNOSIS — Z7982 Long term (current) use of aspirin: Secondary | ICD-10-CM | POA: Diagnosis not present

## 2012-05-24 DIAGNOSIS — Z992 Dependence on renal dialysis: Secondary | ICD-10-CM | POA: Diagnosis not present

## 2012-05-24 DIAGNOSIS — I69993 Ataxia following unspecified cerebrovascular disease: Secondary | ICD-10-CM | POA: Diagnosis not present

## 2012-05-25 DIAGNOSIS — Z7982 Long term (current) use of aspirin: Secondary | ICD-10-CM | POA: Diagnosis not present

## 2012-05-25 DIAGNOSIS — Z9181 History of falling: Secondary | ICD-10-CM | POA: Diagnosis not present

## 2012-05-25 DIAGNOSIS — I69993 Ataxia following unspecified cerebrovascular disease: Secondary | ICD-10-CM | POA: Diagnosis not present

## 2012-05-25 DIAGNOSIS — Z992 Dependence on renal dialysis: Secondary | ICD-10-CM | POA: Diagnosis not present

## 2012-05-25 DIAGNOSIS — I129 Hypertensive chronic kidney disease with stage 1 through stage 4 chronic kidney disease, or unspecified chronic kidney disease: Secondary | ICD-10-CM | POA: Diagnosis not present

## 2012-05-25 DIAGNOSIS — N184 Chronic kidney disease, stage 4 (severe): Secondary | ICD-10-CM | POA: Diagnosis not present

## 2012-05-26 ENCOUNTER — Telehealth: Payer: Self-pay

## 2012-05-26 MED ORDER — TERAZOSIN HCL 10 MG PO CAPS: 10.0000 mg | ORAL_CAPSULE | Freq: Every day | ORAL | Status: DC

## 2012-05-26 MED ORDER — FUROSEMIDE 40 MG PO TABS: 40.0000 mg | ORAL_TABLET | Freq: Every day | ORAL | Status: DC

## 2012-05-26 NOTE — Telephone Encounter (Signed)
Refills done.

## 2012-05-27 ENCOUNTER — Other Ambulatory Visit: Payer: Self-pay | Admitting: *Deleted

## 2012-05-27 ENCOUNTER — Ambulatory Visit (INDEPENDENT_AMBULATORY_CARE_PROVIDER_SITE_OTHER): Payer: Medicare Other | Admitting: Vascular Surgery

## 2012-05-27 ENCOUNTER — Encounter: Payer: Self-pay | Admitting: *Deleted

## 2012-05-27 ENCOUNTER — Encounter: Payer: Self-pay | Admitting: Vascular Surgery

## 2012-05-27 VITALS — BP 108/69 | HR 69 | Ht 72.0 in | Wt 229.4 lb

## 2012-05-27 DIAGNOSIS — N186 End stage renal disease: Secondary | ICD-10-CM | POA: Diagnosis not present

## 2012-05-27 NOTE — Progress Notes (Signed)
[facility] [place]  Established Dialysis Access  History of Present Illness  [name] is a 52 y.o. (1960-04-21) male who presents for re-evaluation of L RC AVF (12/02/11).  The patient is right hand dominant.  Previous access procedures have been completed in the left arm.  The patient's complication from previous access procedures include: non-maturation.  The patient has never had a previous PPM placed.  The patient has never had steal symptoms.  Past Medical History  Diagnosis Date  . ACHALASIA   . CEREBROVASCULAR ACCIDENT, HX OF   . DEGENERATIVE JOINT DISEASE, SHOULDER   . GERD   . Headache   . HYPERLIPIDEMIA   . HYPERTENSION   . RENAL CALCULUS, HX OF   . RENAL INSUFFICIENCY   . H/O hiatal hernia   . CAD (coronary artery disease), 3 vessel significant disease.    . Impaired glucose tolerance   . S/P PTCA (percutaneous transluminal coronary angioplasty), 06/30/11 07/01/2011  . CKD (chronic kidney disease) stage 4, GFR 15-29 ml/min   . History of diastolic dysfunction, grade 2 by echo   . S/P coronary artery stent placement, PTCA/DES Resolute to mid-LAD via the LIMA graft, and PTCA of apical 95% stenosis 07/05/11 07/04/2011  . NSTEMI (non-ST elevated myocardial infarction), 06/30/11 07/01/2011  . DEPRESSION   . Sleep apnea     SLEEP STUDY IN [place] , NO MACHINE YET  1 YEAR  . Stroke     93/2005/2006/2007  . Seizures     INSULIN INDUCED  . ANEMIA-NOS     Past Surgical History  Procedure Laterality Date  . Tonsillectomy    . Stomach surgery  2009    achalasia  . Knee surgery  01/07/2011    right  . Insertion of dialysis catheter  04/01/2011    Procedure: INSERTION OF DIALYSIS CATHETER;  Surgeon: Nilda Simmer, MD;  Location: [facility] OR;  Service: Vascular;  Laterality: N/A;  . Cardiac catheterization    . Coronary angioplasty      06/30/11   AT [facility]  . Coronary artery bypass graft  04/01/2011    Procedure: CORONARY ARTERY BYPASS GRAFTING (CABG);   Surgeon: Kathlee Nations Suann Larry, MD;  Location: [facility] OR;  Service: Open Heart Surgery;  Laterality: N/A;  . Av fistula placement  12/02/2011    Procedure: ARTERIOVENOUS (AV) FISTULA CREATION;  Surgeon: Fransisco Hertz, MD;  Location: [facility] OR;  Service: Vascular;  Laterality: Left;  Ultrasound Guided  . Subclavian stent placement  12/20/2011    History   Social History  . Marital Status: Divorced    Spouse Name: N/A    Number of Children: N/A  . Years of Education: N/A   Occupational History  . Not on file.   Social History Main Topics  . Smoking status: Never Smoker   . Smokeless tobacco: Never Used  . Alcohol Use: Yes     Comment: rarely  . Drug Use: No  . Sexually Active: Not on file   Other Topics Concern  . Not on file   Social History Narrative  . No narrative on file    Family History  Problem Relation Age of Onset  . Hypertension Other   . Stroke Other   . Hyperlipidemia Other   . Cancer Mother   . Heart disease Mother   . Hyperlipidemia Mother   . Hypertension Mother   . Cancer Father   . Heart disease Father     Current Outpatient Prescriptions on  File Prior to Visit  Medication Sig Dispense Refill  . acetaminophen (TYLENOL) 325 MG tablet Take 650 mg by mouth every 6 (six) hours as needed. For pain      . amLODipine (NORVASC) 10 MG tablet Take 1 tablet (10 mg total) by mouth daily.  90 tablet  3  . aspirin EC 81 MG EC tablet Take 1 tablet (81 mg total) by mouth daily.      . cloNIDine (CATAPRES) 0.2 MG tablet Take 0.2 mg by mouth 2 (two) times daily.       . clopidogrel (PLAVIX) 75 MG tablet Take 1 tablet (75 mg total) by mouth daily.  30 tablet  3  . febuxostat (ULORIC) 40 MG tablet Take 40 mg by mouth 2 (two) times daily.      . fluocinonide cream (LIDEX) 0.05 % Apply topically 2 (two) times daily.  30 g  1  . furosemide (LASIX) 40 MG tablet Take 1 tablet (40 mg total) by mouth daily.  90 tablet  3  . hydrALAZINE (APRESOLINE) 10 MG tablet Take 10 mg by mouth 2  (two) times daily.      . hydrOXYzine (ATARAX/VISTARIL) 10 MG tablet Take 10 mg by mouth 2 (two) times daily. For itching      . isosorbide mononitrate (IMDUR) 60 MG 24 hr tablet Take 1 tablet (60 mg total) by mouth daily.  30 tablet  6  . metoprolol (LOPRESSOR) 50 MG tablet Take 50 mg by mouth 2 (two) times daily.      . Omega-3 Fatty Acids (FISH OIL) 1200 MG CAPS Take 1 capsule by mouth 3 (three) times daily.      . permethrin (ELIMITE) 5 % cream       . polysaccharide iron (NIFEREX) 150 MG CAPS capsule Take 150 mg by mouth daily.      . ranolazine (RANEXA) 500 MG 12 hr tablet Take 1 tablet (500 mg total) by mouth every morning.  30 tablet  6  . rosuvastatin (CRESTOR) 20 MG tablet Take 20 mg by mouth every evening.      . terazosin (HYTRIN) 10 MG capsule Take 1 capsule (10 mg total) by mouth at bedtime.  90 capsule  3  . Ticagrelor (BRILINTA) 90 MG TABS tablet Take 1 tablet (90 mg total) by mouth 2 (two) times daily.  60 tablet  11  . triamcinolone cream (KENALOG) 0.1 %       . zolpidem (AMBIEN) 10 MG tablet Take 1 tablet (10 mg total) by mouth at bedtime as needed for sleep.  30 tablet  5   No current facility-administered medications on file prior to visit.    Allergies  Allergen Reactions  . Shrimp (Shellfish Allergy) Shortness Of Breath  . Atorvastatin Other (See Comments)    REACTION: weakness  . Insulins Other (See Comments)    Patient unsure as to the kind of insulin but states that it has previously caused seizures. This occurred in the hospital in 2006; but in most recent hospitalization (Jan 2013) received insulin but did not have reaction  . Simvastatin Other (See Comments)    REACTION: abnormal liver tests  . Ultram (Tramadol)     Review of Systems (Positive items checked otherwise negative)  General: [ ]  Weight loss, [ ]  Weight gain, [ ]   Loss of appetite, [ ]  Fever  Neurologic: [ ]  Dizziness, [ ]  Blackouts, [ ]  Headaches, [ ]  Seizure  Ear/Nose/Throat: [ ]  Change in  eyesight, [ ]  Change  in hearing, [ ]  Nose bleeds, [ ]  Sore throat  Vascular: [ ]  Pain in legs with walking, [ ]  Pain in feet while lying flat, [ ]  Non-healing ulcer, [x]  Stroke, [ ]  "Mini stroke", [ ]  Slurred speech, [ ]  Temporary blindness, [ ]  Blood clot in vein, [ ]  Phlebitis  Pulmonary: [ ]  Home oxygen, [ ]  Productive cough, [ ]  Bronchitis, [ ]  Coughing up blood,  [ ]  Asthma, [ ]  Wheezing  Musculoskeletal: [ ]  Arthritis, [ ]  Joint pain, [ ]  Muscle pain  Cardiac: [ ]  Chest pain, [ ]  Chest tightness/pressure, [ ]  Shortness of breath when lying flat, [ ]  Shortness of breath with exertion, [ ]  Palpitations, [ ]  Heart murmur, [ ]  Arrythmia, [ ]  Atrial fibrillation  Hematologic: [ ]  Bleeding problems, [ ]  Clotting disorder, [ ]  Anemia  Psychiatric:  [ ]  Depression, [ ]  Anxiety, [ ]  Attention deficit disorder  Gastrointestinal:  [ ]  Black stool,[ ]   Blood in stool, [ ]  Peptic ulcer disease, [ ]  Reflux, [ ]  Hiatal hernia, [ ]  Trouble swallowing, [ ]  Diarrhea, [ ]  Constipation  Urinary:  [x]  Kidney disease, [ ]  Burning with urination, [ ]  Frequent urination, [ ]  Difficulty urinating  Skin: [ ]  Ulcers, [ ]  Rashes    Physical Examination  Filed Vitals:   05/27/12 0905  BP: 108/69  Pulse: 69  Height: 6' (1.829 m)  Weight: 229 lb 6.4 oz (104.055 kg)  SpO2: 100%   Body mass index is 31.11 kg/(m^2).  General: A&O x 3, WDWN  Pulmonary: Sym exp, good air movt, CTAB, no rales, rhonchi, & wheezing  Cardiac: RRR, Nl S1, S2, no Murmurs, rubs or gallops  Gastrointestinal: soft, NTND, -G/R, - HSM, - masses, - CVAT B  Musculoskeletal: M/S 5/5 throughout , Extremities without  ischemic changes   Neurologic:Pain and light touch intact in extremities , Motor exam as listed above, palpable thrill in distal L RC AVF, unable to feel proximal fistula, +bruit  Medical Decision Making  [name] is a 52 y.o. male who presents with CKD Stage V, poorly maturing L RC AVF, s/p L SCA  stenting  Unfortunately, after 6 months, I doubt this fistula will adequately mature.  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  I recommend ligating the L RC AVF and placement of L BC AVF at this point.  I had an extensive discussion with this patient in regards to the nature of access surgery, including risk, benefits, and alternatives.    The patient is aware that the risks of access surgery include but are not limited to: bleeding, infection, steal syndrome, nerve damage, ischemic monomelic neuropathy, failure of access to mature, and possible need for additional access procedures in the future.  The patient has agreed to proceed with the above procedure which will be scheduled 25 MAR 14.  Leonides Sake, MD [facility] [place] Office: [phone] Pager: [phone]  05/27/2012, 9:50 AM

## 2012-05-28 ENCOUNTER — Encounter (HOSPITAL_COMMUNITY): Payer: Self-pay | Admitting: Pharmacy Technician

## 2012-05-28 DIAGNOSIS — Z9181 History of falling: Secondary | ICD-10-CM

## 2012-05-28 DIAGNOSIS — N184 Chronic kidney disease, stage 4 (severe): Secondary | ICD-10-CM

## 2012-05-28 DIAGNOSIS — Z7982 Long term (current) use of aspirin: Secondary | ICD-10-CM | POA: Diagnosis not present

## 2012-05-28 DIAGNOSIS — Z992 Dependence on renal dialysis: Secondary | ICD-10-CM | POA: Diagnosis not present

## 2012-05-28 DIAGNOSIS — I129 Hypertensive chronic kidney disease with stage 1 through stage 4 chronic kidney disease, or unspecified chronic kidney disease: Secondary | ICD-10-CM

## 2012-05-28 DIAGNOSIS — I69993 Ataxia following unspecified cerebrovascular disease: Secondary | ICD-10-CM

## 2012-05-31 DIAGNOSIS — I129 Hypertensive chronic kidney disease with stage 1 through stage 4 chronic kidney disease, or unspecified chronic kidney disease: Secondary | ICD-10-CM | POA: Diagnosis not present

## 2012-05-31 DIAGNOSIS — Z992 Dependence on renal dialysis: Secondary | ICD-10-CM | POA: Diagnosis not present

## 2012-05-31 DIAGNOSIS — N184 Chronic kidney disease, stage 4 (severe): Secondary | ICD-10-CM | POA: Diagnosis not present

## 2012-05-31 DIAGNOSIS — I69993 Ataxia following unspecified cerebrovascular disease: Secondary | ICD-10-CM | POA: Diagnosis not present

## 2012-05-31 DIAGNOSIS — Z7982 Long term (current) use of aspirin: Secondary | ICD-10-CM | POA: Diagnosis not present

## 2012-05-31 DIAGNOSIS — Z9181 History of falling: Secondary | ICD-10-CM | POA: Diagnosis not present

## 2012-06-01 DIAGNOSIS — Z7982 Long term (current) use of aspirin: Secondary | ICD-10-CM | POA: Diagnosis not present

## 2012-06-01 DIAGNOSIS — N184 Chronic kidney disease, stage 4 (severe): Secondary | ICD-10-CM | POA: Diagnosis not present

## 2012-06-01 DIAGNOSIS — I129 Hypertensive chronic kidney disease with stage 1 through stage 4 chronic kidney disease, or unspecified chronic kidney disease: Secondary | ICD-10-CM | POA: Diagnosis not present

## 2012-06-01 DIAGNOSIS — I69993 Ataxia following unspecified cerebrovascular disease: Secondary | ICD-10-CM | POA: Diagnosis not present

## 2012-06-01 DIAGNOSIS — Z992 Dependence on renal dialysis: Secondary | ICD-10-CM | POA: Diagnosis not present

## 2012-06-01 DIAGNOSIS — Z9181 History of falling: Secondary | ICD-10-CM | POA: Diagnosis not present

## 2012-06-03 DIAGNOSIS — N184 Chronic kidney disease, stage 4 (severe): Secondary | ICD-10-CM | POA: Diagnosis not present

## 2012-06-03 DIAGNOSIS — Z7982 Long term (current) use of aspirin: Secondary | ICD-10-CM | POA: Diagnosis not present

## 2012-06-03 DIAGNOSIS — Z9181 History of falling: Secondary | ICD-10-CM | POA: Diagnosis not present

## 2012-06-03 DIAGNOSIS — Z992 Dependence on renal dialysis: Secondary | ICD-10-CM | POA: Diagnosis not present

## 2012-06-03 DIAGNOSIS — I69993 Ataxia following unspecified cerebrovascular disease: Secondary | ICD-10-CM | POA: Diagnosis not present

## 2012-06-03 DIAGNOSIS — I129 Hypertensive chronic kidney disease with stage 1 through stage 4 chronic kidney disease, or unspecified chronic kidney disease: Secondary | ICD-10-CM | POA: Diagnosis not present

## 2012-06-04 ENCOUNTER — Encounter (HOSPITAL_COMMUNITY): Payer: Self-pay | Admitting: *Deleted

## 2012-06-04 NOTE — Progress Notes (Signed)
Sleep study done in 2012 done in [facility] in [place]

## 2012-06-04 NOTE — Progress Notes (Signed)
Cardiologist is Dr.Berry with last visit about 3-4 months;next visit in about 2months  Echo report in epic from 02/2012  Stress test was done in 2013  Heart cath reports in epic  Dr.James John with [facility]  EKG in epic from 01/2012  Denies CXR in epic

## 2012-06-07 MED ORDER — CEFUROXIME SODIUM 1.5 G IJ SOLR
1.5000 g | INTRAMUSCULAR | Status: AC
  Administered 2012-06-08: 1.5 g via INTRAVENOUS
  Filled 2012-06-07: qty 1.5

## 2012-06-08 ENCOUNTER — Encounter (HOSPITAL_COMMUNITY): Payer: Self-pay | Admitting: Certified Registered Nurse Anesthetist

## 2012-06-08 ENCOUNTER — Ambulatory Visit (HOSPITAL_COMMUNITY)
Admission: RE | Admit: 2012-06-08 | Discharge: 2012-06-08 | Disposition: A | Discharge status: Home / Self Care | Payer: Medicare Other | Source: Ambulatory Visit | Attending: Vascular Surgery | Admitting: Vascular Surgery

## 2012-06-08 ENCOUNTER — Encounter (HOSPITAL_COMMUNITY): Payer: Self-pay | Admitting: *Deleted

## 2012-06-08 ENCOUNTER — Encounter (HOSPITAL_COMMUNITY)
Admission: RE | Disposition: A | Discharge status: Home / Self Care | Payer: Self-pay | Source: Ambulatory Visit | Attending: Vascular Surgery

## 2012-06-08 ENCOUNTER — Telehealth: Payer: Self-pay | Admitting: Vascular Surgery

## 2012-06-08 ENCOUNTER — Ambulatory Visit (HOSPITAL_COMMUNITY): Payer: Medicare Other | Admitting: Certified Registered Nurse Anesthetist

## 2012-06-08 ENCOUNTER — Ambulatory Visit (HOSPITAL_COMMUNITY): Payer: Medicare Other

## 2012-06-08 DIAGNOSIS — K219 Gastro-esophageal reflux disease without esophagitis: Secondary | ICD-10-CM | POA: Insufficient documentation

## 2012-06-08 DIAGNOSIS — N184 Chronic kidney disease, stage 4 (severe): Secondary | ICD-10-CM

## 2012-06-08 DIAGNOSIS — D649 Anemia, unspecified: Secondary | ICD-10-CM | POA: Diagnosis not present

## 2012-06-08 DIAGNOSIS — N186 End stage renal disease: Secondary | ICD-10-CM | POA: Diagnosis not present

## 2012-06-08 DIAGNOSIS — N185 Chronic kidney disease, stage 5: Secondary | ICD-10-CM

## 2012-06-08 DIAGNOSIS — I739 Peripheral vascular disease, unspecified: Secondary | ICD-10-CM | POA: Diagnosis not present

## 2012-06-08 DIAGNOSIS — E785 Hyperlipidemia, unspecified: Secondary | ICD-10-CM | POA: Diagnosis not present

## 2012-06-08 DIAGNOSIS — Z8673 Personal history of transient ischemic attack (TIA), and cerebral infarction without residual deficits: Secondary | ICD-10-CM | POA: Diagnosis not present

## 2012-06-08 DIAGNOSIS — T82898A Other specified complication of vascular prosthetic devices, implants and grafts, initial encounter: Secondary | ICD-10-CM | POA: Diagnosis not present

## 2012-06-08 DIAGNOSIS — K449 Diaphragmatic hernia without obstruction or gangrene: Secondary | ICD-10-CM | POA: Insufficient documentation

## 2012-06-08 DIAGNOSIS — T82598A Other mechanical complication of other cardiac and vascular devices and implants, initial encounter: Secondary | ICD-10-CM | POA: Diagnosis not present

## 2012-06-08 DIAGNOSIS — G4730 Sleep apnea, unspecified: Secondary | ICD-10-CM | POA: Insufficient documentation

## 2012-06-08 DIAGNOSIS — Z9861 Coronary angioplasty status: Secondary | ICD-10-CM | POA: Insufficient documentation

## 2012-06-08 DIAGNOSIS — I209 Angina pectoris, unspecified: Secondary | ICD-10-CM | POA: Insufficient documentation

## 2012-06-08 DIAGNOSIS — I120 Hypertensive chronic kidney disease with stage 5 chronic kidney disease or end stage renal disease: Secondary | ICD-10-CM | POA: Insufficient documentation

## 2012-06-08 DIAGNOSIS — I252 Old myocardial infarction: Secondary | ICD-10-CM | POA: Insufficient documentation

## 2012-06-08 DIAGNOSIS — Z01818 Encounter for other preprocedural examination: Secondary | ICD-10-CM | POA: Diagnosis not present

## 2012-06-08 DIAGNOSIS — Y832 Surgical operation with anastomosis, bypass or graft as the cause of abnormal reaction of the patient, or of later complication, without mention of misadventure at the time of the procedure: Secondary | ICD-10-CM | POA: Insufficient documentation

## 2012-06-08 DIAGNOSIS — I2510 Atherosclerotic heart disease of native coronary artery without angina pectoris: Secondary | ICD-10-CM | POA: Diagnosis not present

## 2012-06-08 DIAGNOSIS — M19019 Primary osteoarthritis, unspecified shoulder: Secondary | ICD-10-CM | POA: Insufficient documentation

## 2012-06-08 DIAGNOSIS — J984 Other disorders of lung: Secondary | ICD-10-CM | POA: Diagnosis not present

## 2012-06-08 HISTORY — DX: Gout, unspecified: M10.9

## 2012-06-08 HISTORY — DX: Personal history of colon polyps, unspecified: Z86.0100

## 2012-06-08 HISTORY — PX: AV FISTULA PLACEMENT: SHX1204

## 2012-06-08 HISTORY — DX: Insomnia, unspecified: G47.00

## 2012-06-08 HISTORY — PX: LIGATION OF ARTERIOVENOUS  FISTULA: SHX5948

## 2012-06-08 HISTORY — DX: Personal history of colonic polyps: Z86.010

## 2012-06-08 LAB — POCT I-STAT 4, (NA,K, GLUC, HGB,HCT)
Glucose, Bld: 123 mg/dL — ABNORMAL HIGH (ref 70–99)
HCT: 33 % — ABNORMAL LOW (ref 39.0–52.0)
Potassium: 3.6 mEq/L (ref 3.5–5.1)

## 2012-06-08 LAB — SURGICAL PCR SCREEN
MRSA, PCR: POSITIVE — AB
Staphylococcus aureus: POSITIVE — AB

## 2012-06-08 SURGERY — LIGATION OF ARTERIOVENOUS  FISTULA
Anesthesia: General | Site: Arm Upper | Laterality: Left | Wound class: Clean

## 2012-06-08 MED ORDER — PROPOFOL 10 MG/ML IV BOLUS
INTRAVENOUS | Status: DC | PRN
  Administered 2012-06-08: 30 mg via INTRAVENOUS
  Administered 2012-06-08: 170 mg via INTRAVENOUS

## 2012-06-08 MED ORDER — 0.9 % SODIUM CHLORIDE (POUR BTL) OPTIME
TOPICAL | Status: DC | PRN
  Administered 2012-06-08: 1000 mL

## 2012-06-08 MED ORDER — OXYCODONE HCL 5 MG PO TABS: 5.0000 mg | ORAL_TABLET | ORAL | Status: DC | PRN

## 2012-06-08 MED ORDER — LIDOCAINE-EPINEPHRINE (PF) 1 %-1:200000 IJ SOLN
INTRAMUSCULAR | Status: DC | PRN
  Administered 2012-06-08: 5 mL

## 2012-06-08 MED ORDER — LIDOCAINE HCL (CARDIAC) 20 MG/ML IV SOLN
INTRAVENOUS | Status: DC | PRN
  Administered 2012-06-08: 60 mg via INTRAVENOUS

## 2012-06-08 MED ORDER — ARTIFICIAL TEARS OP OINT
TOPICAL_OINTMENT | OPHTHALMIC | Status: DC | PRN
  Administered 2012-06-08: 1 via OPHTHALMIC

## 2012-06-08 MED ORDER — LIDOCAINE-EPINEPHRINE (PF) 1 %-1:200000 IJ SOLN
INTRAMUSCULAR | Status: AC
  Filled 2012-06-08: qty 10

## 2012-06-08 MED ORDER — MUPIROCIN 2 % EX OINT
TOPICAL_OINTMENT | Freq: Two times a day (BID) | CUTANEOUS | Status: DC
  Administered 2012-06-08: 07:00:00 via NASAL
  Filled 2012-06-08: qty 22

## 2012-06-08 MED ORDER — MIDAZOLAM HCL 5 MG/5ML IJ SOLN
INTRAMUSCULAR | Status: DC | PRN
  Administered 2012-06-08: 2 mg via INTRAVENOUS

## 2012-06-08 MED ORDER — FENTANYL CITRATE 0.05 MG/ML IJ SOLN
INTRAMUSCULAR | Status: DC | PRN
  Administered 2012-06-08 (×2): 25 ug via INTRAVENOUS
  Administered 2012-06-08 (×2): 50 ug via INTRAVENOUS

## 2012-06-08 MED ORDER — BUPIVACAINE HCL (PF) 0.5 % IJ SOLN
INTRAMUSCULAR | Status: AC
  Filled 2012-06-08: qty 30

## 2012-06-08 MED ORDER — BUPIVACAINE HCL (PF) 0.5 % IJ SOLN
INTRAMUSCULAR | Status: DC | PRN
  Administered 2012-06-08: 5 mL

## 2012-06-08 MED ORDER — SODIUM CHLORIDE 0.9 % IV SOLN
INTRAVENOUS | Status: DC
  Administered 2012-06-08: 07:00:00 via INTRAVENOUS

## 2012-06-08 MED ORDER — THROMBIN 20000 UNITS EX SOLR
CUTANEOUS | Status: DC | PRN
  Administered 2012-06-08: 09:00:00 via TOPICAL

## 2012-06-08 MED ORDER — THROMBIN 20000 UNITS EX SOLR
CUTANEOUS | Status: AC
  Filled 2012-06-08: qty 20000

## 2012-06-08 MED ORDER — EPHEDRINE SULFATE 50 MG/ML IJ SOLN
INTRAMUSCULAR | Status: DC | PRN
  Administered 2012-06-08 (×2): 10 mg via INTRAVENOUS

## 2012-06-08 MED ORDER — SODIUM CHLORIDE 0.9 % IR SOLN
Status: DC | PRN
  Administered 2012-06-08: 08:00:00

## 2012-06-08 SURGICAL SUPPLY — 43 items
CANISTER SUCTION 2500CC (MISCELLANEOUS) ×3 IMPLANT
CLIP TI MEDIUM 6 (CLIP) ×3 IMPLANT
CLIP TI WIDE RED SMALL 6 (CLIP) ×3 IMPLANT
CLOTH BEACON ORANGE TIMEOUT ST (SAFETY) ×3 IMPLANT
COVER PROBE W GEL 5X96 (DRAPES) ×3 IMPLANT
COVER SURGICAL LIGHT HANDLE (MISCELLANEOUS) ×3 IMPLANT
DECANTER SPIKE VIAL GLASS SM (MISCELLANEOUS) ×3 IMPLANT
DERMABOND ADVANCED (GAUZE/BANDAGES/DRESSINGS) ×2
DERMABOND ADVANCED .7 DNX12 (GAUZE/BANDAGES/DRESSINGS) ×4 IMPLANT
ELECT REM PT RETURN 9FT ADLT (ELECTROSURGICAL) ×3
ELECTRODE REM PT RTRN 9FT ADLT (ELECTROSURGICAL) ×2 IMPLANT
GEL ULTRASOUND 20GR AQUASONIC (MISCELLANEOUS) ×3 IMPLANT
GLOVE BIO SURGEON STRL SZ7 (GLOVE) ×3 IMPLANT
GLOVE BIOGEL PI IND STRL 6.5 (GLOVE) ×6 IMPLANT
GLOVE BIOGEL PI IND STRL 7.5 (GLOVE) ×4 IMPLANT
GLOVE BIOGEL PI INDICATOR 6.5 (GLOVE) ×3
GLOVE BIOGEL PI INDICATOR 7.5 (GLOVE) ×2
GLOVE SS BIOGEL STRL SZ 7 (GLOVE) ×2 IMPLANT
GLOVE SUPERSENSE BIOGEL SZ 7 (GLOVE) ×1
GLOVE SURG SS PI 7.0 STRL IVOR (GLOVE) ×6 IMPLANT
GOWN PREVENTION PLUS XXLARGE (GOWN DISPOSABLE) ×3 IMPLANT
GOWN STRL NON-REIN LRG LVL3 (GOWN DISPOSABLE) ×9 IMPLANT
HEMOSTAT SURGICEL 2X14 (HEMOSTASIS) IMPLANT
KIT BASIN OR (CUSTOM PROCEDURE TRAY) ×3 IMPLANT
KIT ROOM TURNOVER OR (KITS) ×3 IMPLANT
NS IRRIG 1000ML POUR BTL (IV SOLUTION) ×3 IMPLANT
PACK CV ACCESS (CUSTOM PROCEDURE TRAY) ×3 IMPLANT
PAD ARMBOARD 7.5X6 YLW CONV (MISCELLANEOUS) ×6 IMPLANT
SPONGE GAUZE 4X4 12PLY (GAUZE/BANDAGES/DRESSINGS) ×3 IMPLANT
SPONGE SURGIFOAM ABS GEL 100 (HEMOSTASIS) IMPLANT
SUT ETHILON 3 0 PS 1 (SUTURE) IMPLANT
SUT MNCRL AB 4-0 PS2 18 (SUTURE) ×6 IMPLANT
SUT PROLENE 6 0 BV (SUTURE) ×3 IMPLANT
SUT PROLENE 7 0 BV 1 (SUTURE) ×9 IMPLANT
SUT SILK 0 TIES 10X30 (SUTURE) ×3 IMPLANT
SUT VIC AB 3-0 SH 27 (SUTURE) ×1
SUT VIC AB 3-0 SH 27X BRD (SUTURE) ×2 IMPLANT
SWAB COLLECTION DEVICE MRSA (MISCELLANEOUS) IMPLANT
TOWEL OR 17X24 6PK STRL BLUE (TOWEL DISPOSABLE) ×3 IMPLANT
TOWEL OR 17X26 10 PK STRL BLUE (TOWEL DISPOSABLE) ×3 IMPLANT
TUBE ANAEROBIC SPECIMEN COL (MISCELLANEOUS) IMPLANT
UNDERPAD 30X30 INCONTINENT (UNDERPADS AND DIAPERS) ×3 IMPLANT
WATER STERILE IRR 1000ML POUR (IV SOLUTION) ×3 IMPLANT

## 2012-06-08 NOTE — Telephone Encounter (Signed)
Message copied by Rosalyn Charters on Tue Jun 08, 2012  2:24 PM ------      Message from: Melene Plan      Created: Tue Jun 08, 2012 11:15 AM                   ----- Message -----         From: Fransisco Hertz, MD         Sent: 06/08/2012   9:38 AM           To: Reuel Derby, Melene Plan, RN            [name]      [id number]      Jan 19, 1961            PROCEDURE:      1. left brachiocephalic arteriovenous fistula placement      2. Ligation left radiocephalic arteriovenous fistula              Asst: Lianne Cure, PAC             Follow-up: 4 weeks       ------

## 2012-06-08 NOTE — Preoperative (Signed)
Beta Blockers   Reason not to administer Beta Blockers:Not Applicable  Took am metoprolol

## 2012-06-08 NOTE — H&P (View-Only) (Signed)
[facility] [place]  Established Dialysis Access  History of Present Illness  [name] is a 52 y.o. ([birth year]) male who presents for re-evaluation of L RC AVF (12/02/11).  The patient is right hand dominant.  Previous access procedures have been completed in the left arm.  The patient's complication from previous access procedures include: non-maturation.  The patient has never had a previous PPM placed.  The patient has never had steal symptoms.  Past Medical History  Diagnosis Date  . ACHALASIA   . CEREBROVASCULAR ACCIDENT, HX OF   . DEGENERATIVE JOINT DISEASE, SHOULDER   . GERD   . Headache   . HYPERLIPIDEMIA   . HYPERTENSION   . RENAL CALCULUS, HX OF   . RENAL INSUFFICIENCY   . H/O hiatal hernia   . CAD (coronary artery disease), 3 vessel significant disease.    . Impaired glucose tolerance   . S/P PTCA (percutaneous transluminal coronary angioplasty), 06/30/11 07/01/2011  . CKD (chronic kidney disease) stage 4, GFR 15-29 ml/min   . History of diastolic dysfunction, grade 2 by echo   . S/P coronary artery stent placement, PTCA/DES Resolute to mid-LAD via the LIMA graft, and PTCA of apical 95% stenosis 07/05/11 07/04/2011  . NSTEMI (non-ST elevated myocardial infarction), 06/30/11 07/01/2011  . DEPRESSION   . Sleep apnea     SLEEP STUDY IN [place] , NO MACHINE YET  1 YEAR  . Stroke     93/2005/2006/2007  . Seizures     INSULIN INDUCED  . ANEMIA-NOS     Past Surgical History  Procedure Laterality Date  . Tonsillectomy    . Stomach surgery  2009    [place]  . Knee surgery  01/07/2011    right  . Insertion of dialysis catheter  04/01/2011    Procedure: INSERTION OF DIALYSIS CATHETER;  Surgeon: Nilda Simmer, MD;  Location[facility] OR;  Service: Vascular;  Laterality: N/A;  . Cardiac catheterization    . Coronary angioplasty      06/30/11   AT [facility]  . Coronary artery bypass graft  04/01/2011    Procedure: CORONARY ARTERY BYPASS GRAFTING (CABG);   Surgeon: Kathlee Nations Suann Larry, MD;  Location: [facility] OR;  Service: Open Heart Surgery;  Laterality: N/A;  . Av fistula placement  12/02/2011    Procedure: ARTERIOVENOUS (AV) FISTULA CREATION;  Surgeon: Fransisco Hertz, MD;  Location: [facility] OR;  Service: Vascular;  Laterality: Left;  Ultrasound Guided  . Subclavian stent placement  12/20/2011    History   Social History  . Marital Status: Divorced    Spouse Name: N/A    Number of Children: N/A  . Years of Education: N/A   Occupational History  . Not on file.   Social History Main Topics  . Smoking status: Never Smoker   . Smokeless tobacco: Never Used  . Alcohol Use: Yes     Comment: rarely  . Drug Use: No  . Sexually Active: Not on file   Other Topics Concern  . Not on file   Social History Narrative  . No narrative on file    Family History  Problem Relation Age of Onset  . Hypertension Other   . Stroke Other   . Hyperlipidemia Other   . Cancer Mother   . Heart disease Mother   . Hyperlipidemia Mother   . Hypertension Mother   . Cancer Father   . Heart disease Father     Current Outpatient Prescriptions on  File Prior to Visit  Medication Sig Dispense Refill  . acetaminophen (TYLENOL) 325 MG tablet Take 650 mg by mouth every 6 (six) hours as needed. For pain      . amLODipine (NORVASC) 10 MG tablet Take 1 tablet (10 mg total) by mouth daily.  90 tablet  3  . aspirin EC 81 MG EC tablet Take 1 tablet (81 mg total) by mouth daily.      . cloNIDine (CATAPRES) 0.2 MG tablet Take 0.2 mg by mouth 2 (two) times daily.       . clopidogrel (PLAVIX) 75 MG tablet Take 1 tablet (75 mg total) by mouth daily.  30 tablet  3  . febuxostat (ULORIC) 40 MG tablet Take 40 mg by mouth 2 (two) times daily.      . fluocinonide cream (LIDEX) 0.05 % Apply topically 2 (two) times daily.  30 g  1  . furosemide (LASIX) 40 MG tablet Take 1 tablet (40 mg total) by mouth daily.  90 tablet  3  . hydrALAZINE (APRESOLINE) 10 MG tablet Take 10 mg by mouth 2  (two) times daily.      . hydrOXYzine (ATARAX/VISTARIL) 10 MG tablet Take 10 mg by mouth 2 (two) times daily. For itching      . isosorbide mononitrate (IMDUR) 60 MG 24 hr tablet Take 1 tablet (60 mg total) by mouth daily.  30 tablet  6  . metoprolol (LOPRESSOR) 50 MG tablet Take 50 mg by mouth 2 (two) times daily.      . Omega-3 Fatty Acids (FISH OIL) 1200 MG CAPS Take 1 capsule by mouth 3 (three) times daily.      . permethrin (ELIMITE) 5 % cream       . polysaccharide iron (NIFEREX) 150 MG CAPS capsule Take 150 mg by mouth daily.      . ranolazine (RANEXA) 500 MG 12 hr tablet Take 1 tablet (500 mg total) by mouth every morning.  30 tablet  6  . rosuvastatin (CRESTOR) 20 MG tablet Take 20 mg by mouth every evening.      . terazosin (HYTRIN) 10 MG capsule Take 1 capsule (10 mg total) by mouth at bedtime.  90 capsule  3  . Ticagrelor (BRILINTA) 90 MG TABS tablet Take 1 tablet (90 mg total) by mouth 2 (two) times daily.  60 tablet  11  . triamcinolone cream (KENALOG) 0.1 %       . zolpidem (AMBIEN) 10 MG tablet Take 1 tablet (10 mg total) by mouth at bedtime as needed for sleep.  30 tablet  5   No current facility-administered medications on file prior to visit.    Allergies  Allergen Reactions  . Shrimp (Shellfish Allergy) Shortness Of Breath  . Atorvastatin Other (See Comments)    REACTION: weakness  . Insulins Other (See Comments)    Patient unsure as to the kind of insulin but states that it has previously caused seizures. This occurred in the hospital in 2006; but in most recent hospitalization (Jan 2013) received insulin but did not have reaction  . Simvastatin Other (See Comments)    REACTION: abnormal liver tests  . Ultram (Tramadol)     Review of Systems (Positive items checked otherwise negative)  General: [ ]  Weight loss, [ ]  Weight gain, [ ]   Loss of appetite, [ ]  Fever  Neurologic: [ ]  Dizziness, [ ]  Blackouts, [ ]  Headaches, [ ]  Seizure  Ear/Nose/Throat: [ ]  Change in  eyesight, [ ]  Change  in hearing, [ ]  Nose bleeds, [ ]  Sore throat  Vascular: [ ]  Pain in legs with walking, [ ]  Pain in feet while lying flat, [ ]  Non-healing ulcer, [x]  Stroke, [ ]  "Mini stroke", [ ]  Slurred speech, [ ]  Temporary blindness, [ ]  Blood clot in vein, [ ]  Phlebitis  Pulmonary: [ ]  Home oxygen, [ ]  Productive cough, [ ]  Bronchitis, [ ]  Coughing up blood,  [ ]  Asthma, [ ]  Wheezing  Musculoskeletal: [ ]  Arthritis, [ ]  Joint pain, [ ]  Muscle pain  Cardiac: [ ]  Chest pain, [ ]  Chest tightness/pressure, [ ]  Shortness of breath when lying flat, [ ]  Shortness of breath with exertion, [ ]  Palpitations, [ ]  Heart murmur, [ ]  Arrythmia, [ ]  Atrial fibrillation  Hematologic: [ ]  Bleeding problems, [ ]  Clotting disorder, [ ]  Anemia  Psychiatric:  [ ]  Depression, [ ]  Anxiety, [ ]  Attention deficit disorder  Gastrointestinal:  [ ]  Black stool,[ ]   Blood in stool, [ ]  Peptic ulcer disease, [ ]  Reflux, [ ]  Hiatal hernia, [ ]  Trouble swallowing, [ ]  Diarrhea, [ ]  Constipation  Urinary:  [x]  Kidney disease, [ ]  Burning with urination, [ ]  Frequent urination, [ ]  Difficulty urinating  Skin: [ ]  Ulcers, [ ]  Rashes    Physical Examination  Filed Vitals:   05/27/12 0905  BP: 108/69  Pulse: 69  Height: 6' (1.829 m)  Weight: 229 lb 6.4 oz (104.055 kg)  SpO2: 100%   Body mass index is 31.11 kg/(m^2).  General: A&O x 3, WDWN  Pulmonary: Sym exp, good air movt, CTAB, no rales, rhonchi, & wheezing  Cardiac: RRR, Nl S1, S2, no Murmurs, rubs or gallops  Gastrointestinal: soft, NTND, -G/R, - HSM, - masses, - CVAT B  Musculoskeletal: M/S 5/5 throughout , Extremities without  ischemic changes   Neurologic:Pain and light touch intact in extremities , Motor exam as listed above, palpable thrill in distal L RC AVF, unable to feel proximal fistula, +bruit  Medical Decision Making  [name] is a 52 y.o. male who presents with CKD Stage V, poorly maturing L RC AVF, s/p L SCA  stenting  Unfortunately, after 6 months, I doubt this fistula will adequately mature.  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  I recommend ligating the L RC AVF and placement of L BC AVF at this point.  I had an extensive discussion with this patient in regards to the nature of access surgery, including risk, benefits, and alternatives.    The patient is aware that the risks of access surgery include but are not limited to: bleeding, infection, steal syndrome, nerve damage, ischemic monomelic neuropathy, failure of access to mature, and possible need for additional access procedures in the future.  The patient has agreed to proceed with the above procedure which will be scheduled 25 MAR 14.  Leonides Sake, MD [facility] [place] Office: [phone] Pager: [phone]  05/27/2012, 9:50 AM

## 2012-06-08 NOTE — Transfer of Care (Signed)
Immediate Anesthesia Transfer of Care Note  Patient: [name]  Procedure(s) Performed: Procedure(s): LIGATION OF ARTERIOVENOUS  FISTULA (Left) ARTERIOVENOUS (AV) FISTULA CREATION (Left)  Patient Location: PACU  Anesthesia Type:General  Level of Consciousness: awake, alert  and oriented  Airway & Oxygen Therapy: Patient Spontanous Breathing  Post-op Assessment: Report given to PACU RN, Post -op Vital signs reviewed and stable and Patient moving all extremities X 4  Post vital signs: Reviewed and stable  Complications: No apparent anesthesia complications

## 2012-06-08 NOTE — Anesthesia Postprocedure Evaluation (Signed)
  Anesthesia Post-op Note  Patient: [name]  Procedure(s) Performed: Procedure(s): LIGATION OF ARTERIOVENOUS  FISTULA (Left) ARTERIOVENOUS (AV) FISTULA CREATION (Left)  Patient Location: PACU  Anesthesia Type:General  Level of Consciousness: awake  Airway and Oxygen Therapy: Patient Spontanous Breathing  Post-op Pain: mild  Post-op Assessment: Post-op Vital signs reviewed  Post-op Vital Signs: Reviewed  Complications: No apparent anesthesia complications

## 2012-06-08 NOTE — Anesthesia Preprocedure Evaluation (Signed)
Anesthesia Evaluation  Patient identified by MRN, date of birth, ID band Patient awake    Reviewed: Allergy & Precautions, H&P , NPO status , Patient's Chart, lab work & pertinent test results  Airway Mallampati: II      Dental   Pulmonary sleep apnea ,  breath sounds clear to auscultation        Cardiovascular hypertension, + angina + CAD, + Past MI and + Peripheral Vascular Disease Rhythm:Regular Rate:Normal     Neuro/Psych    GI/Hepatic Neg liver ROS, hiatal hernia, GERD-  ,  Endo/Other  negative endocrine ROS  Renal/GU Renal disease     Musculoskeletal   Abdominal   Peds  Hematology   Anesthesia Other Findings   Reproductive/Obstetrics                           Anesthesia Physical Anesthesia Plan  ASA: III  Anesthesia Plan: General   Post-op Pain Management:    Induction: Intravenous  Airway Management Planned: LMA  Additional Equipment:   Intra-op Plan:   Post-operative Plan:   Informed Consent: I have reviewed the patients History and Physical, chart, labs and discussed the procedure including the risks, benefits and alternatives for the proposed anesthesia with the patient or authorized representative who has indicated his/her understanding and acceptance.   Dental advisory given  Plan Discussed with: CRNA, Anesthesiologist and Surgeon  Anesthesia Plan Comments:         Anesthesia Quick Evaluation

## 2012-06-08 NOTE — Interval H&P Note (Signed)
History and Physical Interval Note:  06/08/2012 7:28 AM  [name]  has presented today for surgery, with the diagnosis of ESRD  The various methods of treatment have been discussed with the patient and family. After consideration of risks, benefits and other options for treatment, the patient has consented to  Procedure(s): ARTERIOVENOUS (AV) FISTULA CREATION (Left) LIGATION OF ARTERIOVENOUS  FISTULA (Left) as a surgical intervention .  The patient's history has been reviewed, patient examined, no change in status, stable for surgery.  I have reviewed the patient's chart and labs.  Questions were answered to the patient's satisfaction.     [name]

## 2012-06-08 NOTE — Op Note (Signed)
OPERATIVE NOTE   PROCEDURE: 1. left brachiocephalic arteriovenous fistula placement 2. Ligation left radiocephalic arteriovenous fistula    PRE-OPERATIVE DIAGNOSIS: chronic kidney disease stage V   POST-OPERATIVE DIAGNOSIS: same as above   SURGEON: Leonides Sake, MD  ASSISTANT(S): Lianne Cure, PAC   ANESTHESIA: local and general  ESTIMATED BLOOD LOSS: 50 cc  FINDING(S): 1.  Palpable thrill in fistula 2.  Dopplerable radial and ulnar blood flow: monophasic flow  SPECIMEN(S):  none  INDICATIONS:   [name] is a 52 y.o. male who presents with chronic kidney disease stage V.  The patient is scheduled for left brachiocephalic arteriovenous fistula placement.  The patient is aware the risks include but are not limited to: bleeding, infection, steal syndrome, nerve damage, ischemic monomelic neuropathy, failure to mature, and need for additional procedures.  The patient is aware of the risks of the procedure and elects to proceed forward.  DESCRIPTION: After full informed written consent was obtained from the patient, the patient was brought back to the operating room and placed supine upon the operating table.  Prior to induction, the patient received IV antibiotics.   After obtaining adequate anesthesia, the patient was then prepped and draped in the standard fashion for a left arm access procedure.  I turned my attention first to identifying the patient's cephalic vein and brachial artery.  Using SonoSite guidance, the location of these vessels were marked out on the skin.   At this point, I made an incision over the fistula distally, near the anastomosis.  Using blunt dissection and electrocautery, I dissected out the distal radiocephalic fistula and ligated it with two 2-0 silk ties and transected the fistula.  At this point, I injected local anesthetic to obtain a field block of the antecubitum.  In total, I injected about 10 mL of a 1:1 mixture of 0.5% Marcaine without  epinephrine and 1% lidocaine with epinephrine.  I made a transverse incision at the level of the antecubitum and dissected through the subcutaneous tissue and fascia to gain exposure of the brachial artery.  This was noted to be 4 mm in diameter externally.  This was dissected out proximally and distally and controlled with vessel loops .  I then dissected out the cephalic vein.  This was noted to be 4 mm in diameter externally.  The distal segment of the vein was ligated with a  2-0 silk, and the vein was transected.  The proximal segment was iinterrogated with serial dilators.  The vein accepted up to a 4 mm dilator without any difficulty.  I then instilled the heparinized saline into the vein and clamped it.  At this point, I reset my exposure of the brachial artery and placed the artery under tension proximally and distally.  I made an arteriotomy with a #11 blade, and then I extended the arteriotomy with a Potts scissor.  I injected heparinized saline proximal and distal to this arteriotomy.  The vein was then sewn to the artery in an end-to-side configuration with a running stitch of 7-0 Prolene.  Prior to completing this anastomosis, I allowed the vein and artery to backbleed.  There was no evidence of clot from any vessels.  I completed the anastomosis in the usual fashion and then released all vessel loops and clamps.  There was a palpable  thrill in the venous outflow, and there was a dopplerable radial and ulnar pulse.  At this point, I irrigated out the surgical wound.  There was no further active bleeding.  The subcutaneous tissue was reapproximated with a running stitch of 3-0 Vicryl.  The skin was then reapproximated with a running subcuticular stitch of 4-0 Vicryl.  The skin was then cleaned, dried, and reinforced with Dermabond.  At this point, I turn my attention to the wrist incision.  The wound irrigated and then subcutaneous tissue reapproximated with a 3-0 Vicryl stitch.  The skin was  reapproximated with a running subcuticular of 4-0 Monocryl.  The skin was cleaned, dried, and reinforced with Dermabond.  The patient tolerated this procedure well.   COMPLICATIONS: none  CONDITION: stable  Leonides Sake, MD [facility] [place] Office: [phone] Pager: [phone]  06/08/2012, 9:30 AM

## 2012-06-09 DIAGNOSIS — Z992 Dependence on renal dialysis: Secondary | ICD-10-CM | POA: Diagnosis not present

## 2012-06-09 DIAGNOSIS — I129 Hypertensive chronic kidney disease with stage 1 through stage 4 chronic kidney disease, or unspecified chronic kidney disease: Secondary | ICD-10-CM | POA: Diagnosis not present

## 2012-06-09 DIAGNOSIS — I69993 Ataxia following unspecified cerebrovascular disease: Secondary | ICD-10-CM | POA: Diagnosis not present

## 2012-06-09 DIAGNOSIS — Z9181 History of falling: Secondary | ICD-10-CM | POA: Diagnosis not present

## 2012-06-09 DIAGNOSIS — N184 Chronic kidney disease, stage 4 (severe): Secondary | ICD-10-CM | POA: Diagnosis not present

## 2012-06-09 DIAGNOSIS — Z7982 Long term (current) use of aspirin: Secondary | ICD-10-CM | POA: Diagnosis not present

## 2012-06-10 ENCOUNTER — Telehealth: Payer: Self-pay

## 2012-06-10 ENCOUNTER — Encounter (HOSPITAL_COMMUNITY): Payer: Self-pay | Admitting: Vascular Surgery

## 2012-06-10 ENCOUNTER — Other Ambulatory Visit: Payer: Self-pay | Admitting: Internal Medicine

## 2012-06-10 DIAGNOSIS — I69993 Ataxia following unspecified cerebrovascular disease: Secondary | ICD-10-CM | POA: Diagnosis not present

## 2012-06-10 DIAGNOSIS — Z7982 Long term (current) use of aspirin: Secondary | ICD-10-CM | POA: Diagnosis not present

## 2012-06-10 DIAGNOSIS — Z9181 History of falling: Secondary | ICD-10-CM | POA: Diagnosis not present

## 2012-06-10 DIAGNOSIS — Z992 Dependence on renal dialysis: Secondary | ICD-10-CM | POA: Diagnosis not present

## 2012-06-10 DIAGNOSIS — I129 Hypertensive chronic kidney disease with stage 1 through stage 4 chronic kidney disease, or unspecified chronic kidney disease: Secondary | ICD-10-CM | POA: Diagnosis not present

## 2012-06-10 DIAGNOSIS — N184 Chronic kidney disease, stage 4 (severe): Secondary | ICD-10-CM | POA: Diagnosis not present

## 2012-06-10 NOTE — Telephone Encounter (Signed)
HH RN called to report pt. "had a near fall", and bumped his left forearm against the wall.  Reports "a little swelling at the site".  Called pt.  Reports he felt a little unstable when he got out of bed, and caught himself by going against the wall.  States that he hit his left forearm.  Denies any opening of incision, redness, or drainage.   Denies numbness/tingling of fingers/hand.  Denies fever/ chills.  Admits to "a little swelling" in the forearm.  Denies any injury to the upper arm surgical site.  Advised to continue to monitor, and to elevate left arm, at intervals, above the level of the heart.  Pt. encouraged to call office if symptoms worsen.  Verb. understanding.

## 2012-06-11 DIAGNOSIS — I129 Hypertensive chronic kidney disease with stage 1 through stage 4 chronic kidney disease, or unspecified chronic kidney disease: Secondary | ICD-10-CM | POA: Diagnosis not present

## 2012-06-11 DIAGNOSIS — N184 Chronic kidney disease, stage 4 (severe): Secondary | ICD-10-CM | POA: Diagnosis not present

## 2012-06-11 DIAGNOSIS — Z992 Dependence on renal dialysis: Secondary | ICD-10-CM | POA: Diagnosis not present

## 2012-06-11 DIAGNOSIS — Z9181 History of falling: Secondary | ICD-10-CM | POA: Diagnosis not present

## 2012-06-11 DIAGNOSIS — I69993 Ataxia following unspecified cerebrovascular disease: Secondary | ICD-10-CM | POA: Diagnosis not present

## 2012-06-11 DIAGNOSIS — Z7982 Long term (current) use of aspirin: Secondary | ICD-10-CM | POA: Diagnosis not present

## 2012-06-15 DIAGNOSIS — N184 Chronic kidney disease, stage 4 (severe): Secondary | ICD-10-CM | POA: Diagnosis not present

## 2012-06-15 DIAGNOSIS — I129 Hypertensive chronic kidney disease with stage 1 through stage 4 chronic kidney disease, or unspecified chronic kidney disease: Secondary | ICD-10-CM | POA: Diagnosis not present

## 2012-06-15 DIAGNOSIS — I69993 Ataxia following unspecified cerebrovascular disease: Secondary | ICD-10-CM | POA: Diagnosis not present

## 2012-06-15 DIAGNOSIS — Z7982 Long term (current) use of aspirin: Secondary | ICD-10-CM | POA: Diagnosis not present

## 2012-06-15 DIAGNOSIS — Z9181 History of falling: Secondary | ICD-10-CM | POA: Diagnosis not present

## 2012-06-15 DIAGNOSIS — Z992 Dependence on renal dialysis: Secondary | ICD-10-CM | POA: Diagnosis not present

## 2012-06-17 ENCOUNTER — Telehealth: Payer: Self-pay | Admitting: *Deleted

## 2012-06-17 DIAGNOSIS — N184 Chronic kidney disease, stage 4 (severe): Secondary | ICD-10-CM | POA: Diagnosis not present

## 2012-06-17 DIAGNOSIS — I129 Hypertensive chronic kidney disease with stage 1 through stage 4 chronic kidney disease, or unspecified chronic kidney disease: Secondary | ICD-10-CM | POA: Diagnosis not present

## 2012-06-17 DIAGNOSIS — Z992 Dependence on renal dialysis: Secondary | ICD-10-CM | POA: Diagnosis not present

## 2012-06-17 DIAGNOSIS — Z7982 Long term (current) use of aspirin: Secondary | ICD-10-CM | POA: Diagnosis not present

## 2012-06-17 DIAGNOSIS — I69993 Ataxia following unspecified cerebrovascular disease: Secondary | ICD-10-CM | POA: Diagnosis not present

## 2012-06-17 DIAGNOSIS — Z9181 History of falling: Secondary | ICD-10-CM | POA: Diagnosis not present

## 2012-06-17 NOTE — Telephone Encounter (Signed)
Ok for verbal 

## 2012-06-17 NOTE — Telephone Encounter (Signed)
Left msg on vm stating requesting extended therapy for 1 more visit due to pt missing. Is this ok...lmb

## 2012-06-17 NOTE — Telephone Encounter (Signed)
PT [name] informed of ok.

## 2012-06-21 DIAGNOSIS — N183 Chronic kidney disease, stage 3 unspecified: Secondary | ICD-10-CM | POA: Diagnosis not present

## 2012-06-21 DIAGNOSIS — R7989 Other specified abnormal findings of blood chemistry: Secondary | ICD-10-CM | POA: Diagnosis not present

## 2012-06-21 DIAGNOSIS — D649 Anemia, unspecified: Secondary | ICD-10-CM | POA: Diagnosis not present

## 2012-06-21 DIAGNOSIS — I10 Essential (primary) hypertension: Secondary | ICD-10-CM | POA: Diagnosis not present

## 2012-06-23 DIAGNOSIS — I129 Hypertensive chronic kidney disease with stage 1 through stage 4 chronic kidney disease, or unspecified chronic kidney disease: Secondary | ICD-10-CM | POA: Diagnosis not present

## 2012-06-23 DIAGNOSIS — Z9181 History of falling: Secondary | ICD-10-CM | POA: Diagnosis not present

## 2012-06-23 DIAGNOSIS — Z7982 Long term (current) use of aspirin: Secondary | ICD-10-CM | POA: Diagnosis not present

## 2012-06-23 DIAGNOSIS — I69993 Ataxia following unspecified cerebrovascular disease: Secondary | ICD-10-CM | POA: Diagnosis not present

## 2012-06-23 DIAGNOSIS — N184 Chronic kidney disease, stage 4 (severe): Secondary | ICD-10-CM | POA: Diagnosis not present

## 2012-06-23 DIAGNOSIS — Z992 Dependence on renal dialysis: Secondary | ICD-10-CM | POA: Diagnosis not present

## 2012-06-24 ENCOUNTER — Telehealth: Payer: Self-pay

## 2012-06-24 DIAGNOSIS — Z9181 History of falling: Secondary | ICD-10-CM | POA: Diagnosis not present

## 2012-06-24 DIAGNOSIS — I129 Hypertensive chronic kidney disease with stage 1 through stage 4 chronic kidney disease, or unspecified chronic kidney disease: Secondary | ICD-10-CM | POA: Diagnosis not present

## 2012-06-24 DIAGNOSIS — I69993 Ataxia following unspecified cerebrovascular disease: Secondary | ICD-10-CM | POA: Diagnosis not present

## 2012-06-24 DIAGNOSIS — Z7982 Long term (current) use of aspirin: Secondary | ICD-10-CM | POA: Diagnosis not present

## 2012-06-24 DIAGNOSIS — Z992 Dependence on renal dialysis: Secondary | ICD-10-CM | POA: Diagnosis not present

## 2012-06-24 DIAGNOSIS — N184 Chronic kidney disease, stage 4 (severe): Secondary | ICD-10-CM | POA: Diagnosis not present

## 2012-06-24 NOTE — Telephone Encounter (Signed)
Ok for OV with Rene Kocher asap

## 2012-06-24 NOTE — Telephone Encounter (Signed)
HHRN informed 

## 2012-06-24 NOTE — Telephone Encounter (Signed)
HHRN to inform the patient fell on Thursday 06/20/12, no injuries.  He is having leg numbness and leg giving out.  Please advise if he needs OV with PCP or neurologist?

## 2012-06-25 ENCOUNTER — Ambulatory Visit: Payer: Medicare Other | Admitting: Internal Medicine

## 2012-06-25 ENCOUNTER — Telehealth: Payer: Self-pay | Admitting: Internal Medicine

## 2012-06-28 ENCOUNTER — Encounter: Payer: Self-pay | Admitting: Internal Medicine

## 2012-06-28 ENCOUNTER — Ambulatory Visit (INDEPENDENT_AMBULATORY_CARE_PROVIDER_SITE_OTHER): Payer: Medicare Other | Admitting: Internal Medicine

## 2012-06-28 VITALS — BP 102/64 | HR 62 | Temp 97.2°F

## 2012-06-28 DIAGNOSIS — M6281 Muscle weakness (generalized): Secondary | ICD-10-CM

## 2012-06-28 DIAGNOSIS — R7309 Other abnormal glucose: Secondary | ICD-10-CM | POA: Diagnosis not present

## 2012-06-28 DIAGNOSIS — R7302 Impaired glucose tolerance (oral): Secondary | ICD-10-CM

## 2012-06-28 DIAGNOSIS — R531 Weakness: Secondary | ICD-10-CM

## 2012-06-28 DIAGNOSIS — R29898 Other symptoms and signs involving the musculoskeletal system: Secondary | ICD-10-CM

## 2012-06-28 DIAGNOSIS — I10 Essential (primary) hypertension: Secondary | ICD-10-CM | POA: Diagnosis not present

## 2012-06-28 MED ORDER — TICAGRELOR 90 MG PO TABS: 90.0000 mg | ORAL_TABLET | Freq: Two times a day (BID) | ORAL | Status: DC

## 2012-06-28 NOTE — Patient Instructions (Signed)
Please continue all other medications as before, and refills have been done if requested, including the brilinta You will be contacted regarding the referral for: MRI for the head and the lower back You will be contacted regarding the referral for: Dr Yan/neurology

## 2012-06-28 NOTE — Progress Notes (Signed)
Subjective:    Patient ID: [name], male    DOB: 05/08/1960, 52 y.o.   MRN: [id number]  HPI  Here to f/u with family help; c/o left LBP and weakness worse in the past wk, but no bowel or bladder change, fever, wt loss,  worsening LE pain/numbness, gait change or falls. Also with ? LUE weakness as well.  Fell x 1 yesterday without injury.  No HA, no overt bleeding or bruising.  Needs brillinta refill.   Pt denies fever, wt loss, night sweats, loss of appetite, or other constitutional symptoms  Pt denies chest pain, increased sob or doe, wheezing, orthopnea, PND, increased LE swelling, palpitations, dizziness or syncope.   Pt denies polydipsia, polyuria. Past Medical History  Diagnosis Date  . ACHALASIA   . CEREBROVASCULAR ACCIDENT, HX OF   . Headache   . HYPERLIPIDEMIA     takes crestor daily  . RENAL CALCULUS, HX OF   . RENAL INSUFFICIENCY   . H/O hiatal hernia   . CAD (coronary artery disease), 3 vessel significant disease.    . Impaired glucose tolerance   . S/P PTCA (percutaneous transluminal coronary angioplasty), 06/30/11 07/01/2011  . CKD (chronic kidney disease) stage 4, GFR 15-29 ml/min   . History of diastolic dysfunction, grade 2 by echo   . S/P coronary artery stent placement, PTCA/DES Resolute to mid-LAD via the LIMA graft, and PTCA of apical 95% stenosis 07/05/11 07/04/2011  . DEPRESSION   . Sleep apnea     SLEEP STUDY IN [place] , NO MACHINE YET  1 YEAR  . Seizures     INSULIN INDUCED  . ANEMIA-NOS   . NSTEMI (non-ST elevated myocardial infarction), 06/30/11 07/01/2011  . Stroke     93/2005/2006/2007;left sided weakness  . DEGENERATIVE JOINT DISEASE, SHOULDER     knee  . Gout     takes Uloric daily  . GERD     no meds required  . History of colon polyps   . HYPERTENSION     takes Norvasc,Catapress,Hydralazine,Imdur,and Metoprolol daily  . Insomnia     takes Ambien nightly   Past Surgical History  Procedure Laterality Date  . Tonsillectomy    . Stomach  surgery  2009    achalasia  . Knee surgery  01/07/2011    right  . Insertion of dialysis catheter  04/01/2011    Procedure: INSERTION OF DIALYSIS CATHETER;  Surgeon: Nilda Simmer, MD;  Location: [facility] OR;  Service: Vascular;  Laterality: N/A;  . Cardiac catheterization    . Av fistula placement  12/02/2011    Procedure: ARTERIOVENOUS (AV) FISTULA CREATION;  Surgeon: Fransisco Hertz, MD;  Location: [facility] OR;  Service: Vascular;  Laterality: Left;  Ultrasound Guided  . Subclavian stent placement  12/20/2011  . Coronary artery bypass graft  04/01/2011    Procedure: CORONARY ARTERY BYPASS GRAFTING (CABG);  Surgeon: Kathlee Nations Suann Larry, MD;  Location: [facility] OR;  Service: Open Heart Surgery;  Laterality: N/A;  . Coronary angioplasty      06/30/11   AT [facility]  . Colonoscopy    . Hyperlipidemia    . Ligation of arteriovenous  fistula Left 06/08/2012    Procedure: LIGATION OF ARTERIOVENOUS  FISTULA;  Surgeon: Fransisco Hertz, MD;  Location: [facility] OR;  Service: Vascular;  Laterality: Left;  . Av fistula placement Left 06/08/2012    Procedure: ARTERIOVENOUS (AV) FISTULA CREATION;  Surgeon: Fransisco Hertz, MD;  Location: [facility] OR;  Service: Vascular;  Laterality: Left;  reports that he has never smoked. He has never used smokeless tobacco. He reports that  drinks alcohol. He reports that he does not use illicit drugs. family history includes Cancer in his father and mother; Heart disease in his father and mother; Hyperlipidemia in his mother and other; Hypertension in his mother and other; and Stroke in his other. Allergies  Allergen Reactions  . Shrimp (Shellfish Allergy) Shortness Of Breath  . Atorvastatin Other (See Comments)    REACTION: weakness  . Insulins Other (See Comments)    Patient unsure as to the kind of insulin but states that it has previously caused seizures. This occurred in the hospital in 2006; but in most recent hospitalization (Jan 2013) received insulin but did not have reaction  . Simvastatin Other  (See Comments)    REACTION: abnormal liver tests  . Ultram (Tramadol)    Current Outpatient Prescriptions on File Prior to Visit  Medication Sig Dispense Refill  . acetaminophen (TYLENOL) 325 MG tablet Take 650 mg by mouth every 6 (six) hours as needed for pain. For pain      . amLODipine (NORVASC) 10 MG tablet Take 1 tablet (10 mg total) by mouth daily.  90 tablet  3  . aspirin EC 81 MG EC tablet Take 1 tablet (81 mg total) by mouth daily.      . cloNIDine (CATAPRES) 0.2 MG tablet Take 0.2 mg by mouth 2 (two) times daily.       . clopidogrel (PLAVIX) 75 MG tablet Take 75 mg by mouth daily.      . febuxostat (ULORIC) 40 MG tablet Take 40 mg by mouth 2 (two) times daily.      . fluocinonide cream (LIDEX) 0.05 % Apply 1 application topically 2 (two) times daily.      . furosemide (LASIX) 40 MG tablet Take 1 tablet (40 mg total) by mouth daily.  90 tablet  3  . hydrALAZINE (APRESOLINE) 10 MG tablet Take 10 mg by mouth 2 (two) times daily.      . hydrOXYzine (ATARAX/VISTARIL) 10 MG tablet Take 10 mg by mouth 2 (two) times daily. For itching      . isosorbide mononitrate (IMDUR) 60 MG 24 hr tablet Take 1 tablet (60 mg total) by mouth daily.  30 tablet  6  . metoprolol (LOPRESSOR) 50 MG tablet TAKE 1 TABLET BY MOUTH TWICE A DAY  180 tablet  1  . Omega-3 Fatty Acids (FISH OIL) 1200 MG CAPS Take 1 capsule by mouth 3 (three) times daily.      Marland Kitchen oxyCODONE (ROXICODONE) 5 MG immediate release tablet Take 1 tablet (5 mg total) by mouth every 4 (four) hours as needed for pain.  30 tablet  0  . permethrin (ELIMITE) 5 % cream Apply 1 application topically once.       . polysaccharide iron (NIFEREX) 150 MG CAPS capsule Take 150 mg by mouth daily.      . ranolazine (RANEXA) 500 MG 12 hr tablet Take 1 tablet (500 mg total) by mouth every morning.  30 tablet  6  . rosuvastatin (CRESTOR) 20 MG tablet Take 20 mg by mouth every evening.      . terazosin (HYTRIN) 10 MG capsule Take 1 capsule (10 mg total) by mouth  at bedtime.  90 capsule  3  . triamcinolone cream (KENALOG) 0.1 % Apply 1 application topically as needed.       . zolpidem (AMBIEN) 10 MG tablet Take 10 mg by mouth at  bedtime as needed for sleep.      Marland Kitchen zolpidem (AMBIEN) 10 MG tablet Take 1 tablet (10 mg total) by mouth at bedtime as needed for sleep.  30 tablet  5   No current facility-administered medications on file prior to visit.   Review of Systems  Constitutional: Negative for unexpected weight change, or unusual diaphoresis  HENT: Negative for tinnitus.   Eyes: Negative for photophobia and visual disturbance.  Respiratory: Negative for choking and stridor.   Gastrointestinal: Negative for vomiting and blood in stool.  Genitourinary: Negative for hematuria and decreased urine volume.  Musculoskeletal: Negative for acute joint swelling Skin: Negative for color change and wound.  Neurological: Negative for tremors and numbness other than noted  Psychiatric/Behavioral: Negative for decreased concentration or  hyperactivity.       Objective:   Physical Exam BP 102/64  Pulse 62  Temp(Src) 97.2 F (36.2 C) (Oral)  SpO2 95% VS noted, not ill appearing Constitutional: Pt appears well-developed and well-nourished.  HENT: Head: NCAT.  Right Ear: External ear normal.  Left Ear: External ear normal.  Eyes: Conjunctivae and EOM are normal. Pupils are equal, round, and reactive to light.  Neck: Normal range of motion. Neck supple.  Cardiovascular: Normal rate and regular rhythm.   Pulmonary/Chest: Effort normal and breath sounds normal.  - no rales or wheezing Abd:  Soft, NT, non-distended, + BS Neurological: Pt is alert. Not confused . Motor 4+/5 distal LLE, 5/5 LUE and right side, dtr intact, gait not tested  Skin: Skin is warm. No erythema.  Psychiatric: Pt behavior is normal. Thought content normal.     Assessment & Plan:

## 2012-06-29 ENCOUNTER — Ambulatory Visit
Admission: RE | Admit: 2012-06-29 | Discharge: 2012-06-29 | Disposition: A | Discharge status: Home / Self Care | Payer: Medicare Other | Source: Ambulatory Visit | Attending: Internal Medicine | Admitting: Internal Medicine

## 2012-06-29 DIAGNOSIS — M5126 Other intervertebral disc displacement, lumbar region: Secondary | ICD-10-CM | POA: Diagnosis not present

## 2012-06-29 DIAGNOSIS — R531 Weakness: Secondary | ICD-10-CM

## 2012-07-02 DIAGNOSIS — R531 Weakness: Secondary | ICD-10-CM | POA: Insufficient documentation

## 2012-07-02 NOTE — Assessment & Plan Note (Signed)
?   Worsening, for MRI ls spine,  to f/u any worsening symptoms or concerns

## 2012-07-02 NOTE — Assessment & Plan Note (Signed)
stable overall by history and exam, recent data reviewed with pt, and pt to continue medical treatment as before,  to f/u any worsening symptoms or concerns BP Readings from Last 3 Encounters:  06/28/12 102/64  06/08/12 150/89  06/08/12 150/89

## 2012-07-02 NOTE — Assessment & Plan Note (Signed)
stable overall by history and exam, recent data reviewed with pt, and pt to continue medical treatment as before,  to f/u any worsening symptoms or concerns Lab Results  Component Value Date   HGBA1C 6.2 02/27/2012

## 2012-07-02 NOTE — Assessment & Plan Note (Addendum)
Also for Head MRI with subjective LUE weakness as he is adamant, and refer neurology as well

## 2012-07-06 ENCOUNTER — Ambulatory Visit
Admission: RE | Admit: 2012-07-06 | Discharge: 2012-07-06 | Disposition: A | Discharge status: Home / Self Care | Payer: Medicare Other | Source: Ambulatory Visit | Attending: Internal Medicine | Admitting: Internal Medicine

## 2012-07-06 DIAGNOSIS — R531 Weakness: Secondary | ICD-10-CM

## 2012-07-06 DIAGNOSIS — I619 Nontraumatic intracerebral hemorrhage, unspecified: Secondary | ICD-10-CM | POA: Diagnosis not present

## 2012-07-08 ENCOUNTER — Encounter: Payer: Self-pay | Admitting: Vascular Surgery

## 2012-07-09 ENCOUNTER — Ambulatory Visit (INDEPENDENT_AMBULATORY_CARE_PROVIDER_SITE_OTHER): Payer: Medicare Other | Admitting: Vascular Surgery

## 2012-07-09 ENCOUNTER — Encounter: Payer: Self-pay | Admitting: Vascular Surgery

## 2012-07-09 VITALS — BP 106/62 | HR 55 | Temp 97.8°F | Resp 16 | Ht 72.0 in | Wt 236.0 lb

## 2012-07-09 DIAGNOSIS — N186 End stage renal disease: Secondary | ICD-10-CM | POA: Insufficient documentation

## 2012-07-09 DIAGNOSIS — Z48812 Encounter for surgical aftercare following surgery on the circulatory system: Secondary | ICD-10-CM

## 2012-07-09 DIAGNOSIS — Z992 Dependence on renal dialysis: Secondary | ICD-10-CM

## 2012-07-09 DIAGNOSIS — N184 Chronic kidney disease, stage 4 (severe): Secondary | ICD-10-CM

## 2012-07-09 NOTE — Progress Notes (Signed)
[facility] [place]  Postoperative Access Visit  History of Present Illness  [name] is a 52 y.o. year old male who presents for postoperative follow-up for: left BC fistula creation(Date: 06/08/2012).  The patient's wounds are well healed.  The patient notes no steal symptoms.  The patient is  able to complete their activities of daily living.    Physical Examination  Filed Vitals:   07/09/12 1408  BP: 106/62  Pulse: 55  Temp: 97.8 F (36.6 C)  Resp: 16   LE: Incision is WELL healed, skin feels warm, hand grip is 5/5, sensation in digits is  intact, palpable thrill, bruit can be auscultated  Ultrasound demonstrated a 5.5 diameter fistula at the anastomosis.  Medical Decision Making  [name] is a 52 y.o. year old male who presents s/p Left BC fistula.  The patient's access will likely be ready for use in the near future.  The patient is not on dialysis at this time.  F/u in 4 weeks for fistula duplex  Thank you for allowing [place] to participate in this patient's care.  Lenox Bink MAUREEN PA-C [facility]   Addendum  I have independently interviewed and examined the patient, and I agree with the physician assistant's findings.  L BC AVF is nearly mature.  Suspect it was be ready by next month.  Leonides Sake, MD [facility] [place] Office: [phone] Pager: [phone]  07/09/2012, 6:12 PM

## 2012-07-15 DIAGNOSIS — N184 Chronic kidney disease, stage 4 (severe): Secondary | ICD-10-CM | POA: Diagnosis not present

## 2012-07-15 DIAGNOSIS — I69993 Ataxia following unspecified cerebrovascular disease: Secondary | ICD-10-CM | POA: Diagnosis not present

## 2012-07-15 DIAGNOSIS — I129 Hypertensive chronic kidney disease with stage 1 through stage 4 chronic kidney disease, or unspecified chronic kidney disease: Secondary | ICD-10-CM | POA: Diagnosis not present

## 2012-07-15 DIAGNOSIS — Z7982 Long term (current) use of aspirin: Secondary | ICD-10-CM | POA: Diagnosis not present

## 2012-07-15 DIAGNOSIS — Z992 Dependence on renal dialysis: Secondary | ICD-10-CM | POA: Diagnosis not present

## 2012-07-15 DIAGNOSIS — Z9181 History of falling: Secondary | ICD-10-CM | POA: Diagnosis not present

## 2012-07-20 ENCOUNTER — Telehealth: Payer: Self-pay

## 2012-07-20 NOTE — Telephone Encounter (Signed)
Called patient and spoke with him tried to sch. Him a fu apt. He did not want to sch. Apt at this time. Patient will call back if he wants to sch. Apt.

## 2012-08-13 ENCOUNTER — Ambulatory Visit: Payer: Medicare Other | Admitting: Vascular Surgery

## 2012-08-13 ENCOUNTER — Other Ambulatory Visit: Payer: Self-pay | Admitting: *Deleted

## 2012-08-14 ENCOUNTER — Other Ambulatory Visit: Payer: Self-pay | Admitting: Cardiology

## 2012-08-16 ENCOUNTER — Other Ambulatory Visit: Payer: Self-pay

## 2012-08-16 MED ORDER — CLOPIDOGREL BISULFATE 75 MG PO TABS: 75.0000 mg | ORAL_TABLET | Freq: Every day | ORAL | Status: DC

## 2012-08-16 NOTE — Telephone Encounter (Signed)
Returning [place] call! [phone]

## 2012-08-26 ENCOUNTER — Encounter: Payer: Self-pay | Admitting: Vascular Surgery

## 2012-08-27 ENCOUNTER — Ambulatory Visit (INDEPENDENT_AMBULATORY_CARE_PROVIDER_SITE_OTHER): Payer: Medicare Other | Admitting: Vascular Surgery

## 2012-08-27 ENCOUNTER — Encounter: Payer: Self-pay | Admitting: Vascular Surgery

## 2012-08-27 ENCOUNTER — Encounter (INDEPENDENT_AMBULATORY_CARE_PROVIDER_SITE_OTHER): Payer: Medicare Other | Admitting: *Deleted

## 2012-08-27 VITALS — BP 126/73 | HR 54 | Resp 16 | Ht 72.0 in | Wt 233.0 lb

## 2012-08-27 DIAGNOSIS — N186 End stage renal disease: Secondary | ICD-10-CM

## 2012-08-27 DIAGNOSIS — T82598A Other mechanical complication of other cardiac and vascular devices and implants, initial encounter: Secondary | ICD-10-CM

## 2012-08-27 DIAGNOSIS — N184 Chronic kidney disease, stage 4 (severe): Secondary | ICD-10-CM

## 2012-08-27 DIAGNOSIS — Z48812 Encounter for surgical aftercare following surgery on the circulatory system: Secondary | ICD-10-CM

## 2012-08-27 NOTE — Progress Notes (Signed)
[facility] [place]  Postoperative Visit hemodialysis access   Date of Surgery: 06/08/12 B-C AVF Surgeon: [facility] HD Center: None - pt not on HD Nephrologist: Dr Hyman Hopes  HPI: [name] is a 52 y.o. male who is 12 weeks S/P creation/revision of left upper extremity Hemodialysis access. The patient denies symptoms of numbness, tingling, weakness and denies pain in the operative limb. Patient is here for post -op evaluation to assess healing and maturation of left brachiocephalic AVF .  Pt also had ligation of left Radiocephalic avf and has residual numbness in thumb from incision. He has normal use and grip in the left Hand  Pt is not on hemodialysis  Physical Examination  Filed Vitals:   08/27/12 1147  BP: 126/73  Pulse: 54  Resp: 16    WDWN male in NAD.  left upper extremity Incision is healed Skin color is normal   Hand grip is 5/5 and sensation in digits is intact; There is a good thrill and good bruit in the left B-C AVF. The graft/fistula is easily palpable and of adequate size  Assessment/Plan [name] is a 52 y.o. year old who is s/p creation/revision of left upper extremity Hemodialysis access. Follow-up in as needed  The patient's access will be ready for use in immediately.  Clinic MD: [facility]   Addendum  I have independently interviewed and examined the patient, and I agree with the physician assistant's findings.  L BC AVF is mature at this point.  It appears to be easily palpable.  It should be usable if he develops end stage renal disease.  Leonides Sake, MD [facility] [place] Office: [phone] Pager: [phone]  08/27/2012, 4:54 PM

## 2012-10-07 DIAGNOSIS — I10 Essential (primary) hypertension: Secondary | ICD-10-CM | POA: Diagnosis not present

## 2012-10-07 DIAGNOSIS — I120 Hypertensive chronic kidney disease with stage 5 chronic kidney disease or end stage renal disease: Secondary | ICD-10-CM | POA: Diagnosis not present

## 2012-10-07 DIAGNOSIS — D649 Anemia, unspecified: Secondary | ICD-10-CM | POA: Diagnosis not present

## 2012-10-07 DIAGNOSIS — N185 Chronic kidney disease, stage 5: Secondary | ICD-10-CM | POA: Diagnosis not present

## 2012-10-07 DIAGNOSIS — N2581 Secondary hyperparathyroidism of renal origin: Secondary | ICD-10-CM | POA: Diagnosis not present

## 2012-11-01 ENCOUNTER — Other Ambulatory Visit: Payer: Self-pay

## 2012-11-01 MED ORDER — ZOLPIDEM TARTRATE 10 MG PO TABS: 10.0000 mg | ORAL_TABLET | Freq: Every evening | ORAL | Status: DC | PRN

## 2012-11-01 NOTE — Telephone Encounter (Signed)
Faxed hardcopy to [facility] 

## 2012-11-01 NOTE — Telephone Encounter (Signed)
Done hardcopy to [facility]  

## 2012-11-10 ENCOUNTER — Ambulatory Visit (INDEPENDENT_AMBULATORY_CARE_PROVIDER_SITE_OTHER): Payer: Medicare Other

## 2012-11-10 ENCOUNTER — Telehealth: Payer: Self-pay | Admitting: Cardiovascular Disease

## 2012-11-10 ENCOUNTER — Encounter: Payer: Self-pay | Admitting: Internal Medicine

## 2012-11-10 ENCOUNTER — Ambulatory Visit (INDEPENDENT_AMBULATORY_CARE_PROVIDER_SITE_OTHER): Payer: Medicare Other | Admitting: Internal Medicine

## 2012-11-10 VITALS — BP 150/90 | HR 59 | Temp 97.8°F | Ht 72.0 in | Wt 238.5 lb

## 2012-11-10 DIAGNOSIS — A64 Unspecified sexually transmitted disease: Secondary | ICD-10-CM

## 2012-11-10 DIAGNOSIS — R7302 Impaired glucose tolerance (oral): Secondary | ICD-10-CM

## 2012-11-10 DIAGNOSIS — N320 Bladder-neck obstruction: Secondary | ICD-10-CM | POA: Diagnosis not present

## 2012-11-10 DIAGNOSIS — R369 Urethral discharge, unspecified: Secondary | ICD-10-CM | POA: Diagnosis not present

## 2012-11-10 DIAGNOSIS — R7309 Other abnormal glucose: Secondary | ICD-10-CM | POA: Diagnosis not present

## 2012-11-10 DIAGNOSIS — E785 Hyperlipidemia, unspecified: Secondary | ICD-10-CM

## 2012-11-10 DIAGNOSIS — I10 Essential (primary) hypertension: Secondary | ICD-10-CM

## 2012-11-10 LAB — URINALYSIS, ROUTINE W REFLEX MICROSCOPIC
Bilirubin Urine: NEGATIVE
Ketones, ur: NEGATIVE
Specific Gravity, Urine: >=1.03 (ref 1.000–1.030)
Urine Glucose: NEGATIVE
pH: 5.5 (ref 5.0–8.0)

## 2012-11-10 LAB — CBC WITH DIFFERENTIAL/PLATELET
Eosinophils Relative: 3.8 % (ref 0.0–5.0)
HCT: 39.8 % (ref 39.0–52.0)
Hemoglobin: 13.4 g/dL (ref 13.0–17.0)
Lymphs Abs: 1.3 10*3/uL (ref 0.7–4.0)
MCV: 93.1 fl (ref 78.0–100.0)
Monocytes Absolute: 0.6 10*3/uL (ref 0.1–1.0)
Monocytes Relative: 10.1 % (ref 3.0–12.0)
Neutro Abs: 3.5 10*3/uL (ref 1.4–7.7)
RDW: 13.4 % (ref 11.5–14.6)
WBC: 5.6 10*3/uL (ref 4.5–10.5)

## 2012-11-10 NOTE — Assessment & Plan Note (Signed)
stable overall by history and exam, recent data reviewed with pt, and pt to continue medical treatment as before,  to f/u any worsening symptoms or concerns Lab Results  Component Value Date   LDLCALC 53 07/01/2011    

## 2012-11-10 NOTE — Patient Instructions (Signed)
Please continue all other medications as before, and refills have been done if requested. Please have the pharmacy call with any other refills you may need.  Please continue your efforts at being more active, low cholesterol diet, and weight control.  You are otherwise up to date with prevention measures today.  Please go to the LAB in the Basement (turn left off the elevator) for the tests to be done today  You will be contacted by phone if any changes need to be made immediately.  Otherwise, you will receive a letter about your results with an explanation, but please check with MyChart first.  Please remember to sign up for MyChart if you have not done so, as this will be important to you in the future with finding out test results, communicating by private email, and scheduling acute appointments online when needed.  Please return in 1 year for your yearly visit, or sooner if needed 

## 2012-11-10 NOTE — Assessment & Plan Note (Signed)
[place]ymp, for glc f/u

## 2012-11-10 NOTE — Progress Notes (Addendum)
Subjective:    Patient ID: [name], male    DOB: [birth year], 52 y.o.   MRN: [id number]  HPI  Here to f/u; overall doing ok,  Pt denies chest pain, increased sob or doe, wheezing, orthopnea, PND, increased LE swelling, palpitations, dizziness or syncope.  Pt denies polydipsia, polyuria, or low sugar symptoms such as weakness or confusion improved with po intake.  Pt denies new neurological symptoms such as new headache, or facial or extremity weakness or numbness.   Pt states overall good compliance with meds, has been trying to follow lower cholesterol, diabetic diet, with wt overall stable,  but little exercise however, getting better and his goal is getting back to being active in the gym.  BP usually < 140/90 per pt, overall followed closely per renal, reviewed labs July 2014 with pt in room.  Pt also asking for STD check as he wants to re-start relations soon.   Not yet started dialysis, s/p fistula in place Past Medical History  Diagnosis Date  . ACHALASIA   . CEREBROVASCULAR ACCIDENT, HX OF   . Headache(784.0)   . HYPERLIPIDEMIA     takes crestor daily  . RENAL CALCULUS, HX OF   . RENAL INSUFFICIENCY   . H/O hiatal hernia   . CAD (coronary artery disease), 3 vessel significant disease.    . Impaired glucose tolerance   . S/P PTCA (percutaneous transluminal coronary angioplasty), 06/30/11 07/01/2011  . CKD (chronic kidney disease) stage 4, GFR 15-29 ml/min   . History of diastolic dysfunction, grade 2 by echo   . S/P coronary artery stent placement, PTCA/DES Resolute to mid-LAD via the LIMA graft, and PTCA of apical 95% stenosis 07/05/11 07/04/2011  . DEPRESSION   . Sleep apnea     SLEEP STUDY IN [place] , NO MACHINE YET  1 YEAR  . Seizures     INSULIN INDUCED  . ANEMIA-NOS   . NSTEMI (non-ST elevated myocardial infarction), 06/30/11 07/01/2011  . Stroke     93/2005/2006/2007;left sided weakness  . DEGENERATIVE JOINT DISEASE, SHOULDER     knee  . Gout     takes Uloric daily   . GERD     no meds required  . History of colon polyps   . HYPERTENSION     takes Norvasc,Catapress,Hydralazine,Imdur,and Metoprolol daily  . Insomnia     takes Ambien nightly   Past Surgical History  Procedure Laterality Date  . Tonsillectomy    . Stomach surgery  2009    achalasia  . Knee surgery  01/07/2011    right  . Insertion of dialysis catheter  04/01/2011    Procedure: INSERTION OF DIALYSIS CATHETER;  Surgeon: Nilda Simmer, MD;  Location: [facility] OR;  Service: Vascular;  Laterality: N/A;  . Cardiac catheterization    . Av fistula placement  12/02/2011    Procedure: ARTERIOVENOUS (AV) FISTULA CREATION;  Surgeon: Fransisco Hertz, MD;  Location: [facility] OR;  Service: Vascular;  Laterality: Left;  Ultrasound Guided  . Subclavian stent placement  12/20/2011  . Coronary artery bypass graft  04/01/2011    Procedure: CORONARY ARTERY BYPASS GRAFTING (CABG);  Surgeon: Kathlee Nations Suann Larry, MD;  Location: [facility] OR;  Service: Open Heart Surgery;  Laterality: N/A;  . Coronary angioplasty      06/30/11   AT [facility]  . Colonoscopy    . Hyperlipidemia    . Ligation of arteriovenous  fistula Left 06/08/2012    Procedure: LIGATION OF ARTERIOVENOUS  FISTULA;  Surgeon:  Fransisco Hertz, MD;  Location: [facility] OR;  Service: Vascular;  Laterality: Left;  . Av fistula placement Left 06/08/2012    Procedure: ARTERIOVENOUS (AV) FISTULA CREATION;  Surgeon: Fransisco Hertz, MD;  Location: [facility] OR;  Service: Vascular;  Laterality: Left;    reports that he has never smoked. He has never used smokeless tobacco. He reports that  drinks alcohol. He reports that he does not use illicit drugs. family history includes Cancer in his father and mother; Heart disease in his father and mother; Hyperlipidemia in his mother and other; Hypertension in his mother and other; Stroke in his other. Allergies  Allergen Reactions  . Shrimp [Shellfish Allergy] Shortness Of Breath  . Atorvastatin Other (See Comments)    REACTION: weakness  . Insulins  Other (See Comments)    Patient unsure as to the kind of insulin but states that it has previously caused seizures. This occurred in the hospital in 2006; but in most recent hospitalization (Jan 2013) received insulin but did not have reaction  . Simvastatin Other (See Comments)    REACTION: abnormal liver tests  . Ultram [Tramadol]    Current Outpatient Prescriptions on File Prior to Visit  Medication Sig Dispense Refill  . acetaminophen (TYLENOL) 325 MG tablet Take 650 mg by mouth every 6 (six) hours as needed for pain. For pain      . amLODipine (NORVASC) 10 MG tablet Take 1 tablet (10 mg total) by mouth daily.  90 tablet  3  . cloNIDine (CATAPRES) 0.2 MG tablet Take 0.2 mg by mouth 2 (two) times daily.       . febuxostat (ULORIC) 40 MG tablet Take 40 mg by mouth 2 (two) times daily.      . fluocinonide cream (LIDEX) 0.05 % Apply 1 application topically 2 (two) times daily.      . furosemide (LASIX) 40 MG tablet Take 1 tablet (40 mg total) by mouth daily.  90 tablet  3  . hydrALAZINE (APRESOLINE) 10 MG tablet Take 10 mg by mouth 2 (two) times daily.      . hydrOXYzine (ATARAX/VISTARIL) 10 MG tablet Take 10 mg by mouth 2 (two) times daily. For itching      . isosorbide mononitrate (IMDUR) 60 MG 24 hr tablet TAKE 1 TABLET DAILY, NEED APPT FOR FURTHER REFILLS  30 tablet  0  . metoprolol (LOPRESSOR) 50 MG tablet TAKE 1 TABLET BY MOUTH TWICE A DAY  180 tablet  1  . Omega-3 Fatty Acids (FISH OIL) 1200 MG CAPS Take 1 capsule by mouth 3 (three) times daily.      Marland Kitchen oxyCODONE (ROXICODONE) 5 MG immediate release tablet Take 1 tablet (5 mg total) by mouth every 4 (four) hours as needed for pain.  30 tablet  0  . permethrin (ELIMITE) 5 % cream Apply 1 application topically once.       . polysaccharide iron (NIFEREX) 150 MG CAPS capsule Take 150 mg by mouth daily.      Marland Kitchen terazosin (HYTRIN) 10 MG capsule Take 1 capsule (10 mg total) by mouth at bedtime.  90 capsule  3  . Ticagrelor (BRILINTA) 90 MG TABS  tablet Take 1 tablet (90 mg total) by mouth 2 (two) times daily.  60 tablet  11  . triamcinolone cream (KENALOG) 0.1 % Apply 1 application topically as needed.       . zolpidem (AMBIEN) 10 MG tablet Take 1 tablet (10 mg total) by mouth at bedtime as needed for sleep.  30 tablet  5  . aspirin EC 81 MG EC tablet Take 1 tablet (81 mg total) by mouth daily.      . clopidogrel (PLAVIX) 75 MG tablet Take 1 tablet (75 mg total) by mouth daily.  30 tablet  11  . ranolazine (RANEXA) 500 MG 12 hr tablet Take 1 tablet (500 mg total) by mouth every morning.  30 tablet  6  . rosuvastatin (CRESTOR) 20 MG tablet Take 20 mg by mouth every evening.      . zolpidem (AMBIEN) 10 MG tablet Take 1 tablet (10 mg total) by mouth at bedtime as needed for sleep.  30 tablet  5   No current facility-administered medications on file prior to visit.   Review of Systems  Constitutional: Negative for unexpected weight change, or unusual diaphoresis  HENT: Negative for tinnitus.   Eyes: Negative for photophobia and visual disturbance.  Respiratory: Negative for choking and stridor.   Gastrointestinal: Negative for vomiting and blood in stool.  Genitourinary: Negative for hematuria and decreased urine volume.  Musculoskeletal: Negative for acute joint swelling Skin: Negative for color change and wound.  Neurological: Negative for tremors and numbness other than noted  Psychiatric/Behavioral: Negative for decreased concentration or  hyperactivity.       Objective:   Physical Exam BP 150/90  Pulse 59  Temp(Src) 97.8 F (36.6 C) (Oral)  Ht 6' (1.829 m)  Wt 238 lb 8 oz (108.183 kg)  BMI 32.34 kg/m2  SpO2 97% VS noted,  Constitutional: Pt appears well-developed and well-nourished. Lavella Lemons HENT: Head: NCAT.  Right Ear: External ear normal.  Left Ear: External ear normal.  Eyes: Conjunctivae and EOM are normal. Pupils are equal, round, and reactive to light.  Neck: Normal range of motion. Neck supple.   Cardiovascular: Normal rate and regular rhythm.   Pulmonary/Chest: Effort normal and breath sounds normal.  Abd:  Soft, NT, non-distended, + BS Neurological: Pt is alert. Not confused  Skin: Skin is warm. No erythema. No LE edema Psychiatric: Pt behavior is normal. Thought content normal.     Assessment & Plan:  Quality Measures addressed:   CAD  - drug therapy for lower cholesterol: pt declines further medication ACE/ARB therapy for CAD, Diabetes, and/or LVSD: pt declines further medication

## 2012-11-10 NOTE — Assessment & Plan Note (Signed)
Ok for labs as ordered,  to f/u any worsening symptoms or concerns, advised to safe sex

## 2012-11-10 NOTE — Telephone Encounter (Signed)
Returned call.  Informed pt no samples available.  Pt verbalized understanding and agreed w/ plan.

## 2012-11-10 NOTE — Telephone Encounter (Signed)
Would like to know if he can have some samples of Ranexa 500 mg please.

## 2012-11-10 NOTE — Assessment & Plan Note (Signed)
Mild elev today but stable overall by history and exam, recent data reviewed with pt, and pt to continue medical treatment as before,  to f/u any worsening symptoms or concerns BP Readings from Last 3 Encounters:  11/10/12 150/90  08/27/12 126/73  07/09/12 106/62

## 2012-11-11 LAB — HEPATIC FUNCTION PANEL
AST: 18 U/L (ref 0–37)
Albumin: 3.9 g/dL (ref 3.5–5.2)
Alkaline Phosphatase: 38 U/L — ABNORMAL LOW (ref 39–117)
Total Bilirubin: 0.5 mg/dL (ref 0.3–1.2)

## 2012-11-11 LAB — GC/CHLAMYDIA PROBE AMP, URINE: Chlamydia, Swab/Urine, PCR: NEGATIVE

## 2012-11-11 LAB — BASIC METABOLIC PANEL
Calcium: 9.5 mg/dL (ref 8.4–10.5)
GFR: 18.08 mL/min — ABNORMAL LOW (ref >60.00)
Glucose, Bld: 114 mg/dL — ABNORMAL HIGH (ref 70–99)
Potassium: 4.2 mEq/L (ref 3.5–5.1)
Sodium: 137 mEq/L (ref 135–145)

## 2012-11-11 LAB — LIPID PANEL
HDL: 38.4 mg/dL — ABNORMAL LOW (ref >39.00)
Triglycerides: 238 mg/dL — ABNORMAL HIGH (ref 0.0–149.0)
VLDL: 47.6 mg/dL — ABNORMAL HIGH (ref 0.0–40.0)

## 2012-11-11 LAB — PSA: PSA: 2.75 ng/mL (ref 0.10–4.00)

## 2012-11-11 LAB — HSV 2 ANTIBODY, IGG: HSV 2 Glycoprotein G Ab, IgG: 0.11 IV

## 2012-11-12 LAB — LDL CHOLESTEROL, DIRECT: Direct LDL: 118.1 mg/dL

## 2012-12-13 ENCOUNTER — Telehealth: Payer: Self-pay | Admitting: Cardiovascular Disease

## 2012-12-13 DIAGNOSIS — I2510 Atherosclerotic heart disease of native coronary artery without angina pectoris: Secondary | ICD-10-CM

## 2012-12-13 MED ORDER — RANOLAZINE ER 500 MG PO TB12: 500.0000 mg | ORAL_TABLET | Freq: Every morning | ORAL | Status: DC

## 2012-12-13 MED ORDER — TICAGRELOR 90 MG PO TABS: 90.0000 mg | ORAL_TABLET | Freq: Two times a day (BID) | ORAL | Status: DC

## 2012-12-13 NOTE — Telephone Encounter (Signed)
Returned call.  Pt informed message received.  Also informed it has been > 1 year since he was seen and he was supposed to have an echo before an appt in Dec. 2013.  Pt. Informed samples cannot be given w/o an appt and verbalized understanding.    Appt scheduled for 10.8.14 at 9:15am w/ Dr. Allyson Sabal ( ).  Samples left at front desk for pt to pick up and pt aware.  Echo ordered and message left for pt that test ordered and must be completed before appt on 10.8.14.

## 2012-12-13 NOTE — Addendum Note (Signed)
Addended by: Beecher Mcardle R on: 12/13/2012 01:01 PM   Modules accepted: Orders

## 2012-12-13 NOTE — Telephone Encounter (Signed)
Patient wants samples of Brilanta and Ranexa 500 mg

## 2012-12-17 ENCOUNTER — Telehealth (HOSPITAL_COMMUNITY): Payer: Self-pay | Admitting: *Deleted

## 2012-12-20 DIAGNOSIS — N2581 Secondary hyperparathyroidism of renal origin: Secondary | ICD-10-CM | POA: Diagnosis not present

## 2012-12-20 DIAGNOSIS — I10 Essential (primary) hypertension: Secondary | ICD-10-CM | POA: Diagnosis not present

## 2012-12-20 DIAGNOSIS — I129 Hypertensive chronic kidney disease with stage 1 through stage 4 chronic kidney disease, or unspecified chronic kidney disease: Secondary | ICD-10-CM | POA: Diagnosis not present

## 2012-12-20 DIAGNOSIS — N184 Chronic kidney disease, stage 4 (severe): Secondary | ICD-10-CM | POA: Diagnosis not present

## 2012-12-20 DIAGNOSIS — D649 Anemia, unspecified: Secondary | ICD-10-CM | POA: Diagnosis not present

## 2012-12-21 ENCOUNTER — Ambulatory Visit (HOSPITAL_COMMUNITY)
Admission: RE | Admit: 2012-12-21 | Discharge: 2012-12-21 | Disposition: A | Discharge status: Home / Self Care | Payer: Medicare Other | Source: Ambulatory Visit | Attending: Cardiovascular Disease | Admitting: Cardiovascular Disease

## 2012-12-21 DIAGNOSIS — I2510 Atherosclerotic heart disease of native coronary artery without angina pectoris: Secondary | ICD-10-CM | POA: Insufficient documentation

## 2012-12-21 NOTE — Progress Notes (Signed)
2D Echo Performed 12/21/2012    Zoye Chandra, RCS  

## 2012-12-22 ENCOUNTER — Encounter: Payer: Self-pay | Admitting: Cardiovascular Disease

## 2012-12-22 ENCOUNTER — Ambulatory Visit (INDEPENDENT_AMBULATORY_CARE_PROVIDER_SITE_OTHER): Payer: Medicare Other | Admitting: Cardiovascular Disease

## 2012-12-22 VITALS — BP 128/88 | HR 58 | Ht 72.0 in | Wt 238.1 lb

## 2012-12-22 DIAGNOSIS — Z951 Presence of aortocoronary bypass graft: Secondary | ICD-10-CM | POA: Diagnosis not present

## 2012-12-22 DIAGNOSIS — E785 Hyperlipidemia, unspecified: Secondary | ICD-10-CM | POA: Diagnosis not present

## 2012-12-22 DIAGNOSIS — I2510 Atherosclerotic heart disease of native coronary artery without angina pectoris: Secondary | ICD-10-CM | POA: Diagnosis not present

## 2012-12-22 DIAGNOSIS — I10 Essential (primary) hypertension: Secondary | ICD-10-CM

## 2012-12-22 MED ORDER — TICAGRELOR 90 MG PO TABS: 90.0000 mg | ORAL_TABLET | Freq: Two times a day (BID) | ORAL | Status: DC

## 2012-12-22 MED ORDER — RANOLAZINE ER 500 MG PO TB12: 500.0000 mg | ORAL_TABLET | Freq: Every morning | ORAL | Status: DC

## 2012-12-22 MED ORDER — ROSUVASTATIN CALCIUM 20 MG PO TABS: 20.0000 mg | ORAL_TABLET | Freq: Every evening | ORAL | Status: DC

## 2012-12-22 NOTE — Assessment & Plan Note (Signed)
Status post coronary artery bypass grafting by Dr. Kathlee Nations Trigt 04/01/11 with a LIMA to his LAD, a vein to obtuse marginal branch one and 2 and to the posterolateral branch. Ejection fraction was normal preoperatively. He had a admission subsequent to that with unstable angina and EKG changes. Dr. Daphene Jaeger recast him April 2013 and angioplasty the OM vein graft insertion which was too small to stent and stented his LAD beyond the LIMA insertion with drug-eluting stents. He has been symptom-free since I last saw him 08/22/11.

## 2012-12-22 NOTE — Progress Notes (Signed)
Quick Note:  Released into my chart ______ 

## 2012-12-22 NOTE — Progress Notes (Signed)
12/22/2012 [name]   04-May-1960  [id number]  Primary Physician Oliver Barre, MD Primary Cardiologist: Runell Gess MD [name]   HPI:  The patient is a 52 year old, mild to moderately overweight, divorced, African American male, father of one 37-year-old son who is accompanied by his sister today. I saw him 6 weeks ago. He has a history of CAD status post bypass grafting by Dr. Kathlee Nations Trigt April 01, 2011 with a LIMA to his LAD, a vein to an obtuse marginal branch 1 and 2, and to the posterolateral branch. His EF was apparently normal pre and postoperatively. He also has difficult to control hypertension, chronic renal insufficiency, now stage 5, for which he sees Dr. Elvis Coil. He is scheduled to have an AV fistula placed by Dr. Imogene Burn sometime in the next several weeks in anticipation of dialysis at some point in the future.   He was admitted in April with substernal chest pain and anterolateral T-wave inversion and was cathed and intervened on by Dr. Tresa Endo. He had PCI of his OM vein graft insertion which was too small to stent, and stenting of the LAD beyond his LIMA insertion.   I saw him last in June of 2013. He has not had any recurrent symptoms. He does have stage IV chronic renal insufficiency and has had a dialysis graft placed in his left upper extremity but has yet to start dialysis.    Current Outpatient Prescriptions  Medication Sig Dispense Refill  . acetaminophen (TYLENOL) 325 MG tablet Take 650 mg by mouth every 6 (six) hours as needed for pain. For pain      . aspirin EC 81 MG EC tablet Take 1 tablet (81 mg total) by mouth daily.      . cloNIDine (CATAPRES) 0.2 MG tablet Take 0.2 mg by mouth 2 (two) times daily.       . clopidogrel (PLAVIX) 75 MG tablet Take 1 tablet (75 mg total) by mouth daily.  30 tablet  11  . febuxostat (ULORIC) 40 MG tablet Take 40 mg by mouth 2 (two) times daily.      . fluocinonide cream (LIDEX) 0.05 % Apply 1  application topically 2 (two) times daily.      . furosemide (LASIX) 40 MG tablet Take 1 tablet (40 mg total) by mouth daily.  90 tablet  3  . hydrALAZINE (APRESOLINE) 10 MG tablet Take 10 mg by mouth 2 (two) times daily.      . metoprolol (LOPRESSOR) 50 MG tablet TAKE 1 TABLET BY MOUTH TWICE A DAY  180 tablet  1  . Omega-3 Fatty Acids (FISH OIL) 1200 MG CAPS Take 1 capsule by mouth 3 (three) times daily.      Marland Kitchen oxyCODONE (ROXICODONE) 5 MG immediate release tablet Take 1 tablet (5 mg total) by mouth every 4 (four) hours as needed for pain.  30 tablet  0  . permethrin (ELIMITE) 5 % cream Apply 1 application topically once.       . polysaccharide iron (NIFEREX) 150 MG CAPS capsule Take 150 mg by mouth daily.      . ranolazine (RANEXA) 500 MG 12 hr tablet Take 1 tablet (500 mg total) by mouth every morning.  14 tablet  0  . rosuvastatin (CRESTOR) 20 MG tablet Take 20 mg by mouth every evening.      . terazosin (HYTRIN) 10 MG capsule Take 1 capsule (10 mg total) by mouth at bedtime.  90 capsule  3  . Ticagrelor (BRILINTA) 90 MG TABS tablet Take 1 tablet (90 mg total) by mouth 2 (two) times daily.  60 tablet  11  . triamcinolone cream (KENALOG) 0.1 % Apply 1 application topically as needed.       . zolpidem (AMBIEN) 10 MG tablet Take 1 tablet (10 mg total) by mouth at bedtime as needed for sleep.  30 tablet  5   No current facility-administered medications for this visit.    Allergies  Allergen Reactions  . Shrimp [Shellfish Allergy] Shortness Of Breath  . Atorvastatin Other (See Comments)    REACTION: weakness  . Insulins Other (See Comments)    Patient unsure as to the kind of insulin but states that it has previously caused seizures. This occurred in the hospital in 2006; but in most recent hospitalization (Jan 2013) received insulin but did not have reaction  . Simvastatin Other (See Comments)    REACTION: abnormal liver tests  . Ultram [Tramadol]     History   Social History  .  Marital Status: Divorced    Spouse Name: N/A    Number of Children: N/A  . Years of Education: N/A   Occupational History  . Not on file.   Social History Main Topics  . Smoking status: Never Smoker   . Smokeless tobacco: Never Used  . Alcohol Use: Yes     Comment: rarely  . Drug Use: No  . Sexual Activity: Not Currently   Other Topics Concern  . Not on file   Social History Narrative  . No narrative on file     Review of Systems: General: negative for chills, fever, night sweats or weight changes.  Cardiovascular: negative for chest pain, dyspnea on exertion, edema, orthopnea, palpitations, paroxysmal nocturnal dyspnea or shortness of breath Dermatological: negative for rash Respiratory: negative for cough or wheezing Urologic: negative for hematuria Abdominal: negative for nausea, vomiting, diarrhea, bright red blood per rectum, melena, or hematemesis Neurologic: negative for visual changes, syncope, or dizziness All other systems reviewed and are otherwise negative except as noted above.    Blood pressure 128/88, pulse 58, height 6' (1.829 m), weight 238 lb 1.6 oz (108.001 kg).  General appearance: alert and no distress Neck: no adenopathy, no JVD, supple, symmetrical, trachea midline, thyroid not enlarged, symmetric, no tenderness/mass/nodules and left carotid to-and-fro bruit probably related to his AV fistula Lungs: clear to auscultation bilaterally Heart: regular rate and rhythm, S1, S2 normal, no murmur, click, rub or gallop Extremities: extremities normal, atraumatic, no cyanosis or edema  EKG sinus bradycardia at 58 with inferior Q waves and lateral T wave inversion along with septal Q waves unchanged from his past EKG  ASSESSMENT AND PLAN:   HYPERTENSION, accelerated Hypertension well controlled on current medications  S/P CABG x 4: 04/01/11-(LIMA to LAD, SVG to PL branch of RCA, Sequential SVG to OM1/2) Status post coronary artery bypass grafting by Dr.  Kathlee Nations Trigt 04/01/11 with a LIMA to his LAD, a vein to obtuse marginal branch one and 2 and to the posterolateral branch. Ejection fraction was normal preoperatively. He had a admission subsequent to that with unstable angina and EKG changes. Dr. Daphene Jaeger recast him April 2013 and angioplasty the OM vein graft insertion which was too small to stent and stented his LAD beyond the LIMA insertion with drug-eluting stents. He has been symptom-free since I last saw him 08/22/11.  HYPERLIPIDEMIA On statin therapy with recent lipid profile performed 11/10/12 revealing a glucose of  188, crit was level of 238 and HDL of 38. The LDL could not be contacted. Dr. Elvis Coil, his nephrologist, recently rechecked this which we'll obtain a copy of.      Runell Gess MD [facility] 12/22/2012 10:00 AM

## 2012-12-22 NOTE — Assessment & Plan Note (Signed)
On statin therapy with recent lipid profile performed 11/10/12 revealing a glucose of 188, crit was level of 238 and HDL of 38. The LDL could not be contacted. Dr. Elvis Coil, his nephrologist, recently rechecked this which we'll obtain a copy of.

## 2012-12-22 NOTE — Assessment & Plan Note (Signed)
Hypertension well controlled on current medications 

## 2012-12-22 NOTE — Patient Instructions (Signed)
Your physician recommends that you schedule a follow-up appointment in: 1 year  

## 2012-12-23 ENCOUNTER — Encounter: Payer: Self-pay | Admitting: Cardiovascular Disease

## 2013-01-07 ENCOUNTER — Other Ambulatory Visit: Payer: Self-pay | Admitting: Internal Medicine

## 2013-01-20 ENCOUNTER — Other Ambulatory Visit: Payer: Self-pay

## 2013-03-15 ENCOUNTER — Telehealth: Payer: Self-pay | Admitting: Cardiovascular Disease

## 2013-03-15 NOTE — Telephone Encounter (Signed)
Would like some samples of Ranexa 500 mg please. °

## 2013-03-15 NOTE — Telephone Encounter (Signed)
Returned call.  Left message that no samples available and to call back before 4pm for script to be sent to pharmacy.

## 2013-03-22 DIAGNOSIS — N039 Chronic nephritic syndrome with unspecified morphologic changes: Secondary | ICD-10-CM | POA: Diagnosis not present

## 2013-03-22 DIAGNOSIS — N2581 Secondary hyperparathyroidism of renal origin: Secondary | ICD-10-CM | POA: Diagnosis not present

## 2013-03-22 DIAGNOSIS — N185 Chronic kidney disease, stage 5: Secondary | ICD-10-CM | POA: Diagnosis not present

## 2013-03-22 DIAGNOSIS — I120 Hypertensive chronic kidney disease with stage 5 chronic kidney disease or end stage renal disease: Secondary | ICD-10-CM | POA: Diagnosis not present

## 2013-03-22 DIAGNOSIS — N189 Chronic kidney disease, unspecified: Secondary | ICD-10-CM | POA: Diagnosis not present

## 2013-03-22 DIAGNOSIS — D631 Anemia in chronic kidney disease: Secondary | ICD-10-CM | POA: Diagnosis not present

## 2013-05-06 ENCOUNTER — Ambulatory Visit: Payer: Medicare Other | Admitting: Internal Medicine

## 2013-05-11 ENCOUNTER — Ambulatory Visit (INDEPENDENT_AMBULATORY_CARE_PROVIDER_SITE_OTHER): Payer: Medicare Other | Admitting: Internal Medicine

## 2013-05-11 ENCOUNTER — Encounter: Payer: Self-pay | Admitting: Internal Medicine

## 2013-05-11 VITALS — BP 140/92 | HR 57 | Temp 98.3°F | Ht 72.0 in | Wt 243.0 lb

## 2013-05-11 DIAGNOSIS — R7302 Impaired glucose tolerance (oral): Secondary | ICD-10-CM

## 2013-05-11 DIAGNOSIS — R7309 Other abnormal glucose: Secondary | ICD-10-CM

## 2013-05-11 DIAGNOSIS — E785 Hyperlipidemia, unspecified: Secondary | ICD-10-CM

## 2013-05-11 DIAGNOSIS — I10 Essential (primary) hypertension: Secondary | ICD-10-CM

## 2013-05-11 NOTE — Assessment & Plan Note (Signed)
stable overall by history and exam, recent data reviewed with pt, and pt to continue medical treatment as before,  to f/u any worsening symptoms or concerns BP Readings from Last 3 Encounters:  05/11/13 140/92  12/22/12 128/88  11/10/12 150/90

## 2013-05-11 NOTE — Progress Notes (Signed)
Subjective:    Patient ID: [name], male    DOB: 02/07/1961, 53 y.o.   MRN: [id number]  HPI  Here to f/u, overall doing ok, needs form filled out for DMV.  Sees card/renal regularly with labs.  Pt denies chest pain, increased sob or doe, wheezing, orthopnea, PND, increased LE swelling, palpitations, dizziness or syncope.  Pt denies new neurological symptoms such as new headache, or facial or extremity weakness or numbness   Pt denies polydipsia, polyuria.  Pt denies fever, wt loss, night sweats, loss of appetite, or other constitutional symptoms Has left AV fistula, not yet required to start dialysis Past Medical History  Diagnosis Date  . ACHALASIA   . CEREBROVASCULAR ACCIDENT, HX OF   . Headache(784.0)   . HYPERLIPIDEMIA     takes crestor daily  . RENAL CALCULUS, HX OF   . RENAL INSUFFICIENCY   . H/O hiatal hernia   . CAD (coronary artery disease), 3 vessel significant disease.    . Impaired glucose tolerance   . S/P PTCA (percutaneous transluminal coronary angioplasty), 06/30/11 07/01/2011  . CKD (chronic kidney disease) stage 4, GFR 15-29 ml/min   . History of diastolic dysfunction, grade 2 by echo   . S/P coronary artery stent placement, PTCA/DES Resolute to mid-LAD via the LIMA graft, and PTCA of apical 95% stenosis 07/05/11 07/04/2011  . DEPRESSION   . Sleep apnea     SLEEP STUDY IN [place] , NO MACHINE YET  1 YEAR  . Seizures     INSULIN INDUCED  . ANEMIA-NOS   . NSTEMI (non-ST elevated myocardial infarction), 06/30/11 07/01/2011  . Stroke     93/2005/2006/2007;left sided weakness  . DEGENERATIVE JOINT DISEASE, SHOULDER     knee  . Gout     takes Uloric daily  . GERD     no meds required  . History of colon polyps   . HYPERTENSION     takes Norvasc,Catapress,Hydralazine,Imdur,and Metoprolol daily  . Insomnia     takes Ambien nightly  . Hyperlipidemia    Past Surgical History  Procedure Laterality Date  . Tonsillectomy    . Stomach surgery  2009    achalasia   . Knee surgery  01/07/2011    right  . Insertion of dialysis catheter  04/01/2011    Procedure: INSERTION OF DIALYSIS CATHETER;  Surgeon: Nilda Simmer, MD;  Location: [facility] OR;  Service: Vascular;  Laterality: N/A;  . Cardiac catheterization    . Av fistula placement  12/02/2011    Procedure: ARTERIOVENOUS (AV) FISTULA CREATION;  Surgeon: Fransisco Hertz, MD;  Location[facility] OR;  Service: Vascular;  Laterality: Left;  Ultrasound Guided  . Subclavian stent placement  12/20/2011  . Coronary artery bypass graft  04/01/2011    Procedure: CORONARY ARTERY BYPASS GRAFTING (CABG);  Surgeon: Kathlee Nations Suann Larry, MD;  Location: [facility] OR;  Service: Open Heart Surgery;  Laterality: N/A;  . Coronary angioplasty      06/30/11   AT [facility]  . Colonoscopy    . Hyperlipidemia    . Ligation of arteriovenous  fistula Left 06/08/2012    Procedure: LIGATION OF ARTERIOVENOUS  FISTULA;  Surgeon: Fransisco Hertz, MD;  Location: [facility] OR;  Service: Vascular;  Laterality: Left;  . Av fistula placement Left 06/08/2012    Procedure: ARTERIOVENOUS (AV) FISTULA CREATION;  Surgeon: Fransisco Hertz, MD;  Location: [facility] OR;  Service: Vascular;  Laterality: Left;    reports that he has never smoked. He  has never used smokeless tobacco. He reports that he drinks alcohol. He reports that he does not use illicit drugs. family history includes Cancer in his father and mother; Heart disease in his father and mother; Hyperlipidemia in his mother and other; Hypertension in his mother and other; Stroke in his other. Allergies  Allergen Reactions  . Shrimp [Shellfish Allergy] Shortness Of Breath  . Atorvastatin Other (See Comments)    REACTION: weakness  . Insulins Other (See Comments)    Patient unsure as to the kind of insulin but states that it has previously caused seizures. This occurred in the hospital in 2006; but in most recent hospitalization (Jan 2013) received insulin but did not have reaction  . Simvastatin Other (See Comments)    REACTION:  abnormal liver tests  . Ultram [Tramadol]    Current Outpatient Prescriptions on File Prior to Visit  Medication Sig Dispense Refill  . acetaminophen (TYLENOL) 325 MG tablet Take 650 mg by mouth every 6 (six) hours as needed for pain. For pain      . aspirin EC 81 MG EC tablet Take 1 tablet (81 mg total) by mouth daily.      . cloNIDine (CATAPRES) 0.2 MG tablet Take 0.2 mg by mouth 2 (two) times daily.       . clopidogrel (PLAVIX) 75 MG tablet Take 1 tablet (75 mg total) by mouth daily.  30 tablet  11  . febuxostat (ULORIC) 40 MG tablet Take 40 mg by mouth 2 (two) times daily.      . fluocinonide cream (LIDEX) 0.05 % Apply 1 application topically 2 (two) times daily.      . furosemide (LASIX) 40 MG tablet Take 1 tablet (40 mg total) by mouth daily.  90 tablet  3  . hydrALAZINE (APRESOLINE) 10 MG tablet Take 10 mg by mouth 2 (two) times daily.      . metoprolol (LOPRESSOR) 50 MG tablet TAKE 1 TABLET BY MOUTH TWICE A DAY  180 tablet  3  . Omega-3 Fatty Acids (FISH OIL) 1200 MG CAPS Take 1 capsule by mouth 3 (three) times daily.      Marland Kitchen oxyCODONE (ROXICODONE) 5 MG immediate release tablet Take 1 tablet (5 mg total) by mouth every 4 (four) hours as needed for pain.  30 tablet  0  . permethrin (ELIMITE) 5 % cream Apply 1 application topically once.       . polysaccharide iron (NIFEREX) 150 MG CAPS capsule Take 150 mg by mouth daily.      . ranolazine (RANEXA) 500 MG 12 hr tablet Take 1 tablet (500 mg total) by mouth every morning.  56 tablet  0  . rosuvastatin (CRESTOR) 20 MG tablet Take 1 tablet (20 mg total) by mouth every evening.  28 tablet  0  . terazosin (HYTRIN) 10 MG capsule Take 1 capsule (10 mg total) by mouth at bedtime.  90 capsule  3  . Ticagrelor (BRILINTA) 90 MG TABS tablet Take 1 tablet (90 mg total) by mouth 2 (two) times daily.  28 tablet  0  . triamcinolone cream (KENALOG) 0.1 % Apply 1 application topically as needed.       . zolpidem (AMBIEN) 10 MG tablet Take 1 tablet (10 mg  total) by mouth at bedtime as needed for sleep.  30 tablet  5   No current facility-administered medications on file prior to visit.    Review of Systems  Constitutional: Negative for unexpected weight change, or unusual diaphoresis  HENT:  Negative for tinnitus.   Eyes: Negative for photophobia and visual disturbance.  Respiratory: Negative for choking and stridor.   Gastrointestinal: Negative for vomiting and blood in stool.  Genitourinary: Negative for hematuria and decreased urine volume.  Musculoskeletal: Negative for acute joint swelling Skin: Negative for color change and wound.  Neurological: Negative for tremors and numbness other than noted  Psychiatric/Behavioral: Negative for decreased concentration or  hyperactivity.       Objective:   Physical Exam BP 140/92  Pulse 57  Temp(Src) 98.3 F (36.8 C) (Oral)  Ht 6' (1.829 m)  Wt 243 lb (110.224 kg)  BMI 32.95 kg/m2  SpO2 98% VS noted, not ill appaering Constitutional: Pt appears well-developed and well-nourished.  HENT: Head: NCAT.  Right Ear: External ear normal.  Left Ear: External ear normal.  Eyes: Conjunctivae and EOM are normal. Pupils are equal, round, and reactive to light.  Neck: Normal range of motion. Neck supple.  Cardiovascular: Normal rate and regular rhythm.   Pulmonary/Chest: Effort normal and breath sounds normal.  Left AV fistula with + thrill Neurological: Pt is alert. Not confused  Skin: Skin is warm. No erythema.  Psychiatric: Pt behavior is normal. Thought content normal.     Assessment & Plan:

## 2013-05-11 NOTE — Assessment & Plan Note (Signed)
stable overall by history and exam, recent data reviewed with pt, and pt to continue medical treatment as before,  to f/u any worsening symptoms or concerns Lab Results  Component Value Date   HGBA1C 5.7 11/10/2012

## 2013-05-11 NOTE — Patient Instructions (Signed)
Please continue all other medications as before, and refills have been done if requested. Please have the pharmacy call with any other refills you may need.  Please keep your appointments with your specialists as you have planned  Your form will be filled out soon for the DMV.  Please remember to sign up for MyChart if you have not done so, as this will be important to you in the future with finding out test results, communicating by private email, and scheduling acute appointments online when needed.  Please return in 6 months, or sooner if needed

## 2013-05-11 NOTE — Progress Notes (Signed)
Pre visit review using our clinic review tool, if applicable. No additional management support is needed unless otherwise documented below in the visit note. 

## 2013-05-11 NOTE — Assessment & Plan Note (Signed)
stable overall by history and exam, recent data reviewed with pt, and pt to continue medical treatment as before,  to f/u any worsening symptoms or concerns Lab Results  Component Value Date   LDLCALC 53 07/01/2011

## 2013-05-19 DIAGNOSIS — Z0279 Encounter for issue of other medical certificate: Secondary | ICD-10-CM

## 2013-06-20 ENCOUNTER — Other Ambulatory Visit: Payer: Self-pay | Admitting: Cardiology

## 2013-06-21 NOTE — Telephone Encounter (Signed)
Rx was sent to pharmacy electronically. 

## 2013-06-23 DIAGNOSIS — N185 Chronic kidney disease, stage 5: Secondary | ICD-10-CM | POA: Diagnosis not present

## 2013-06-23 DIAGNOSIS — N2581 Secondary hyperparathyroidism of renal origin: Secondary | ICD-10-CM | POA: Diagnosis not present

## 2013-06-23 DIAGNOSIS — I120 Hypertensive chronic kidney disease with stage 5 chronic kidney disease or end stage renal disease: Secondary | ICD-10-CM | POA: Diagnosis not present

## 2013-06-23 DIAGNOSIS — N039 Chronic nephritic syndrome with unspecified morphologic changes: Secondary | ICD-10-CM | POA: Diagnosis not present

## 2013-06-23 DIAGNOSIS — D631 Anemia in chronic kidney disease: Secondary | ICD-10-CM | POA: Diagnosis not present

## 2013-06-24 ENCOUNTER — Other Ambulatory Visit: Payer: Self-pay | Admitting: Internal Medicine

## 2013-06-24 ENCOUNTER — Encounter (HOSPITAL_COMMUNITY): Payer: Self-pay | Admitting: *Deleted

## 2013-06-24 ENCOUNTER — Encounter: Payer: Self-pay | Admitting: Cardiology

## 2013-06-24 ENCOUNTER — Ambulatory Visit (INDEPENDENT_AMBULATORY_CARE_PROVIDER_SITE_OTHER): Payer: Medicare Other | Admitting: Cardiology

## 2013-06-24 VITALS — BP 130/80 | HR 61 | Ht 72.0 in | Wt 250.0 lb

## 2013-06-24 DIAGNOSIS — I214 Non-ST elevation (NSTEMI) myocardial infarction: Secondary | ICD-10-CM

## 2013-06-24 DIAGNOSIS — N184 Chronic kidney disease, stage 4 (severe): Secondary | ICD-10-CM

## 2013-06-24 DIAGNOSIS — I209 Angina pectoris, unspecified: Secondary | ICD-10-CM

## 2013-06-24 DIAGNOSIS — Z951 Presence of aortocoronary bypass graft: Secondary | ICD-10-CM

## 2013-06-24 DIAGNOSIS — I2510 Atherosclerotic heart disease of native coronary artery without angina pectoris: Secondary | ICD-10-CM | POA: Diagnosis not present

## 2013-06-24 DIAGNOSIS — Z955 Presence of coronary angioplasty implant and graft: Secondary | ICD-10-CM

## 2013-06-24 DIAGNOSIS — Z9861 Coronary angioplasty status: Secondary | ICD-10-CM

## 2013-06-24 DIAGNOSIS — I208 Other forms of angina pectoris: Secondary | ICD-10-CM | POA: Insufficient documentation

## 2013-06-24 DIAGNOSIS — I2089 Other forms of angina pectoris: Secondary | ICD-10-CM | POA: Insufficient documentation

## 2013-06-24 MED ORDER — ISOSORBIDE MONONITRATE ER 30 MG PO TB24: 30.0000 mg | ORAL_TABLET | Freq: Every day | ORAL | Status: DC

## 2013-06-24 NOTE — Assessment & Plan Note (Signed)
Hx of MI last year

## 2013-06-24 NOTE — Assessment & Plan Note (Signed)
See above

## 2013-06-24 NOTE — Patient Instructions (Signed)
I added Imdur a long acting Nitro to help prevent pain.  You will need stress test the one on the stretcher in next 1-2 weeks  You will follow up with Dr. Allyson Sabal to discuss results and if cardiac cath needed.

## 2013-06-24 NOTE — Progress Notes (Signed)
06/24/2013   PCP: Oliver Barre, MD   Chief Complaint  Patient presents with  . Chest Pain    intermittent sharp pain x 2 weeks  mostly during exertion some times at rest    Primary Cardiologist: Dr. Allyson Sabal  HPI:  53 year old, mild to moderately overweight, divorced, African American male, father of one 46-year-old son presents today after 4 episodes of chest pain.  The episodes occurred with exertion patient does have an intimate relationships. Sharp stabbing chest pain most of the time is relieved with 2 nitroglycerin sublingual. Twice the pain was associated with nausea and once for shortness of breath no diaphoresis. It feels like his previous coronary angina has felt. He thought he might be having a heart attack. Fortunately it only lasted several minutes.   He has a history of CAD status post bypass grafting by Dr. Kathlee Nations Trigt April 01, 2011 with a LIMA to his LAD, a vein to an obtuse marginal branch 1 and 2, and to the posterolateral branch. His EF was apparently normal pre and postoperatively. Pt had unstable angina in 06/2011 with abnormal stress test.  Recath in April 2013 and angioplasty the OM vein graft insertion which was too small to stent and stented his LAD beyond the LIMA insertion with drug-eluting stents. He has been symptom-free since until now.   He also has difficult to control hypertension, chronic renal insufficiency, now stage 5, for which he sees Dr. Elvis Coil. He is scheduled to have an AV fistula placed by Dr. Imogene Burn left brachiocephalic arteriovenous fistula 06/08/2012.  He saw Dr. Hyman Hopes this week and he is stable with his renal failure. Has not yet had to undergo dialysis.        Allergies  Allergen Reactions  . Shrimp [Shellfish Allergy] Shortness Of Breath  . Atorvastatin Other (See Comments)    REACTION: weakness  . Insulins Other (See Comments)    Patient unsure as to the kind of insulin but states that it has previously caused  seizures. This occurred in the hospital in 2006; but in most recent hospitalization (Jan 2013) received insulin but did not have reaction  . Simvastatin Other (See Comments)    REACTION: abnormal liver tests  . Ultram [Tramadol]     Current Outpatient Prescriptions  Medication Sig Dispense Refill  . acetaminophen (TYLENOL) 325 MG tablet Take 650 mg by mouth every 6 (six) hours as needed for pain. For pain      . aspirin EC 81 MG EC tablet Take 1 tablet (81 mg total) by mouth daily.      . cloNIDine (CATAPRES) 0.2 MG tablet Take 0.2 mg by mouth 2 (two) times daily.       . clopidogrel (PLAVIX) 75 MG tablet Take 1 tablet (75 mg total) by mouth daily.  30 tablet  11  . febuxostat (ULORIC) 40 MG tablet Take 40 mg by mouth 2 (two) times daily.      . fluocinonide cream (LIDEX) 0.05 % Apply 1 application topically 2 (two) times daily.      . furosemide (LASIX) 40 MG tablet Take 1 tablet (40 mg total) by mouth daily.  90 tablet  3  . hydrALAZINE (APRESOLINE) 10 MG tablet Take 10 mg by mouth 2 (two) times daily.      . metoprolol (LOPRESSOR) 50 MG tablet TAKE 1 TABLET BY MOUTH TWICE A DAY  180 tablet  3  . NITROSTAT 0.4 MG SL tablet PLACE  1 TABLET UNDER TONGUE AS NEEDED FOR CHEST PAIN EVERY 5 MINUTES (MAX 3DOSES)  25 tablet  4  . Omega-3 Fatty Acids (FISH OIL) 1200 MG CAPS Take 1 capsule by mouth 3 (three) times daily.      Marland Kitchen oxyCODONE (ROXICODONE) 5 MG immediate release tablet Take 1 tablet (5 mg total) by mouth every 4 (four) hours as needed for pain.  30 tablet  0  . polysaccharide iron (NIFEREX) 150 MG CAPS capsule Take 150 mg by mouth daily.      . ranolazine (RANEXA) 500 MG 12 hr tablet Take 1 tablet (500 mg total) by mouth every morning.  56 tablet  0  . rosuvastatin (CRESTOR) 20 MG tablet Take 1 tablet (20 mg total) by mouth every evening.  28 tablet  0  . Ticagrelor (BRILINTA) 90 MG TABS tablet Take 1 tablet (90 mg total) by mouth 2 (two) times daily.  28 tablet  0  . triamcinolone cream  (KENALOG) 0.1 % Apply 1 application topically as needed.       . zolpidem (AMBIEN) 10 MG tablet Take 1 tablet (10 mg total) by mouth at bedtime as needed for sleep.  30 tablet  5  . isosorbide mononitrate (IMDUR) 30 MG 24 hr tablet Take 1 tablet (30 mg total) by mouth daily.  30 tablet  6  . terazosin (HYTRIN) 10 MG capsule TAKE 1 CAPSULE BY MOUTH AT BEDTIME.  90 capsule  3   No current facility-administered medications for this visit.    Past Medical History  Diagnosis Date  . ACHALASIA   . CEREBROVASCULAR ACCIDENT, HX OF   . Headache(784.0)   . HYPERLIPIDEMIA     takes crestor daily  . RENAL CALCULUS, HX OF   . RENAL INSUFFICIENCY   . H/O hiatal hernia   . CAD (coronary artery disease), 3 vessel significant disease.    . Impaired glucose tolerance   . S/P PTCA (percutaneous transluminal coronary angioplasty), 06/30/11 07/01/2011  . CKD (chronic kidney disease) stage 4, GFR 15-29 ml/min   . History of diastolic dysfunction, grade 2 by echo   . S/P coronary artery stent placement, PTCA/DES Resolute to mid-LAD via the LIMA graft, and PTCA of apical 95% stenosis 07/05/11 07/04/2011  . DEPRESSION   . Sleep apnea     SLEEP STUDY IN [place] , NO MACHINE YET  1 YEAR  . Seizures     INSULIN INDUCED  . ANEMIA-NOS   . NSTEMI (non-ST elevated myocardial infarction), 06/30/11 07/01/2011  . Stroke     93/2005/2006/2007;left sided weakness  . DEGENERATIVE JOINT DISEASE, SHOULDER     knee  . Gout     takes Uloric daily  . GERD     no meds required  . History of colon polyps   . HYPERTENSION     takes Norvasc,Catapress,Hydralazine,Imdur,and Metoprolol daily  . Insomnia     takes Ambien nightly  . Hyperlipidemia     Past Surgical History  Procedure Laterality Date  . Tonsillectomy    . Stomach surgery  2009    achalasia  . Knee surgery  01/07/2011    right  . Insertion of dialysis catheter  04/01/2011    Procedure: INSERTION OF DIALYSIS CATHETER;  Surgeon: Nilda Simmer, MD;   [facility];  Service: Vascular;  Laterality: N/A;  . Cardiac catheterization    . Av fistula placement  12/02/2011    Procedure: ARTERIOVENOUS (AV) FISTULA CREATION;  Surgeon: Fransisco Hertz, MD;  Location:  [facility] OR;  Service: Vascular;  Laterality: Left;  Ultrasound Guided  . Subclavian stent placement  12/20/2011  . Coronary artery bypass graft  04/01/2011    Procedure: CORONARY ARTERY BYPASS GRAFTING (CABG);  Surgeon: Kathlee NationsPeter Van Suann Larryrigt III, MD;  [facility];  Service: Open Heart Surgery;  Laterality: N/A;  . Coronary angioplasty      [facility]30/11   [facility]  . Colonoscopy    . Hyperlipidemia    . Ligation of arteriovenous  fistula Left 06/08/2012    Procedure: LIGATION OF ARTERIOVENOUS  FISTULA;  Surgeon: Fransisco HertzBrian L Chen, MD;  [facility];  Service: Vascular;  Laterality: Left;  . Av fistula placement Left 06/08/2012    Procedure: ARTERIOVENOUS (AV) FISTULA CREATION;  Surgeon: Fransisco HertzBrian L Chen, MD;  Location: [facility] OR;  Service: Vascular;  Laterality: Left;    ZOX:WRUEAVW:UJROS:General:no colds or fevers, no weight changes Skin:no rashes or ulcers HEENT:no blurred vision, no congestion CV:see HPI PUL:see HPI GI:no diarrhea constipation or melena, no indigestion GU:no hematuria, no dysuria MS:no joint pain, no claudication Neuro:no syncope, no lightheadedness Endo:no diabetes, no thyroid disease  PHYSICAL EXAM BP 130/80  Pulse 61  Ht 6' (1.829 m)  Wt 250 lb (113.399 kg)  BMI 33.90 kg/m2 General:Pleasant affect, NAD Skin:Warm and dry, brisk capillary refill HEENT:normocephalic, sclera clear, mucus membranes moist Neck:supple, no JVD, no bruits  Heart:S1S2 RRR without murmur, gallup, rub or click Lungs:clear without rales, rhonchi, or wheezes WJX:BJYNAbd:soft, non tender, + BS, do not palpate liver spleen or masses Ext:no lower ext edema, 2+ pedal pulses, 2+ radial pulses. Lt arm AV graft Neuro:alert and oriented, MAE, follows commands, + facial symmetry  EKG: SR no acute changes from last EKG.    ASSESSMENT  AND PLAN Exertional angina 4 episodes in the last 3 weeks of chest pain, relief with NTG, "just like his previous heart pain"  I added Imdur 30 mg daily and plan for lexiscan myoview.  He tells me in the past he could not walk on treadmill to reach target heart rate.  If positive he will need cardiac cath.  He will follow up with Dr. Allyson SabalBerry for results.  S/P CABG x 4: 04/01/11-(LIMA to LAD, SVG to PL branch of RCA, Sequential SVG to OM1/2) See above   NSTEMI (non-ST elevated myocardial infarction), 06/30/11 Hx of MI last year  S/P coronary artery stent placement, PTCA/DES Resolute to mid-LAD via the LIMA graft, and PTCA of apical 95% stenosis 06/2011 See above  CKD (chronic kidney disease) stage 4, GFR 15-29 ml/min 2 was reluctant to have LexiScan Myoview secondary to feeling bad after her last one. But with his current kidney function I would not want to go straight to cardiac catheterization unless he was having an acute MI I explained that it could send him to dialysis. He understood and was okay to proceed with LexiScan Myoview.  He stated he could not walk on a treadmill the last time he tried could not get his heart rate up and became too tired to quick.

## 2013-06-24 NOTE — Assessment & Plan Note (Signed)
2 was reluctant to have LexiScan Myoview secondary to feeling bad after her last one. But with his current kidney function I would not want to go straight to cardiac catheterization unless he was having an acute MI I explained that it could send him to dialysis. He understood and was okay to proceed with LexiScan Myoview.  He stated he could not walk on a treadmill the last time he tried could not get his heart rate up and became too tired to quick.

## 2013-06-24 NOTE — Assessment & Plan Note (Signed)
4 episodes in the last 3 weeks of chest pain, relief with NTG, "just like his previous heart pain"  I added Imdur 30 mg daily and plan for lexiscan myoview.  He tells me in the past he could not walk on treadmill to reach target heart rate.  If positive he will need cardiac cath.  He will follow up with Dr. Allyson SabalBerry for results.

## 2013-06-28 ENCOUNTER — Other Ambulatory Visit: Payer: Self-pay | Admitting: *Deleted

## 2013-06-28 ENCOUNTER — Ambulatory Visit (HOSPITAL_COMMUNITY)
Admission: RE | Admit: 2013-06-28 | Discharge: 2013-06-28 | Disposition: A | Discharge status: Home / Self Care | Payer: Medicare Other | Source: Ambulatory Visit | Attending: Cardiovascular Disease | Admitting: Cardiovascular Disease

## 2013-06-28 ENCOUNTER — Encounter (HOSPITAL_COMMUNITY): Payer: Medicare Other

## 2013-06-28 DIAGNOSIS — I208 Other forms of angina pectoris: Secondary | ICD-10-CM

## 2013-06-28 DIAGNOSIS — I209 Angina pectoris, unspecified: Secondary | ICD-10-CM | POA: Insufficient documentation

## 2013-06-28 DIAGNOSIS — I2510 Atherosclerotic heart disease of native coronary artery without angina pectoris: Secondary | ICD-10-CM | POA: Diagnosis not present

## 2013-06-28 MED ORDER — TECHNETIUM TC 99M SESTAMIBI GENERIC - CARDIOLITE
30.6000 | Freq: Once | INTRAVENOUS | Status: AC | PRN
  Administered 2013-06-28: 31 via INTRAVENOUS

## 2013-06-28 MED ORDER — TICAGRELOR 90 MG PO TABS: 90.0000 mg | ORAL_TABLET | Freq: Two times a day (BID) | ORAL | Status: DC

## 2013-06-28 MED ORDER — REGADENOSON 0.4 MG/5ML IV SOLN
0.4000 mg | Freq: Once | INTRAVENOUS | Status: AC
  Administered 2013-06-28: 0.4 mg via INTRAVENOUS

## 2013-06-28 MED ORDER — TECHNETIUM TC 99M SESTAMIBI GENERIC - CARDIOLITE
10.3000 | Freq: Once | INTRAVENOUS | Status: AC | PRN
  Administered 2013-06-28: 10 via INTRAVENOUS

## 2013-06-28 NOTE — Telephone Encounter (Signed)
Patient requested a refill of Brilinita when he was in the office having a test done. RX sent in

## 2013-06-28 NOTE — Procedures (Addendum)
[facility] [address] [place] [address] [phone]  Cardiology Nuclear Med Study  [name] is a 53 y.o. male     MRN : [id number]     DOB: 03/25/1960  Procedure Date: 06/28/2013  Nuclear Med Background Indication for Stress Test:  Graft Patency and Stent Patency History:  CAD;MI;CABG X4;STENT/PTCA-2013;SEIZURES;Last Lexi MPI on 06/25/2011-ischemia;EF=44% Cardiac Risk Factors: CVA, Family History - CAD, Hypertension, Lipids and Overweight  Symptoms:  Chest Pain, DOE and SOB   Nuclear Pre-Procedure Caffeine/Decaff Intake:  7:00pm NPO After: 5:00am   IV Site: R Forearm  IV 0.9% NS with Angio Cath:  22g  Chest Size (in):  46"  IV Started by: Berdie OgrenAmanda Wease, RN  Height: 6' (1.829 m)  Cup Size: n/a  BMI:  Body mass index is 33.9 kg/(m^2). Weight:  250 lb (113.399 kg)   Tech Comments:  n/a    Nuclear Med Study 1 or 2 day study: 1 day  Stress Test Type:  Lexiscan  Order Authorizing Provider:  Nanetta BattyJonathan Merek Niu, MD   Resting Radionuclide: Technetium 7546m Sestamibi  Resting Radionuclide Dose: 10.3 mCi   Stress Radionuclide:  Technetium 7846m Sestamibi  Stress Radionuclide Dose: 30.6 mCi           Stress Protocol Rest HR: 55 Stress HR: 70  Rest BP: 129/98 Stress BP: 144/84  Exercise Time (min): n/a METS: n/a          Dose of Adenosine (mg):  n/a Dose of Lexiscan: 0.4 mg  Dose of Atropine (mg): n/a Dose of Dobutamine: n/a mcg/kg/min (at max HR)  Stress Test Technologist: Ernestene MentionGwen Farrington, CCT Nuclear Technologist: Gonzella LexPam Phillips, CNMT   Rest Procedure:  Myocardial perfusion imaging was performed at rest 45 minutes following the intravenous administration of Technetium 246m Sestamibi. Stress Procedure:  The patient received IV Lexiscan 0.4 mg over 15-seconds.  Technetium 7646m Sestamibi injected IV at 30-seconds.  There were no significant changes with Lexiscan.  Quantitative spect images were obtained after a 45 minute  delay.  Transient Ischemic Dilatation (Normal <1.22):  1.01 Lung/Heart Ratio (Normal <0.45):  0.37 QGS EDV:  195 ml QGS ESV:  124 ml LV Ejection Fraction: 36%       Rest ECG: NSR with non-specific ST-T wave changes  Stress ECG: No significant change from baseline ECG  QPS Raw Data Images:  Normal; no motion artifact; normal heart/lung ratio. Stress Images:  There is decreased uptake in the inferior wall. Rest Images:  Normal homogeneous uptake in all areas of the myocardium. Subtraction (SDS):  Subtle Anterior wall ischemia  Impression Exercise Capacity:  Lexiscan with no exercise. BP Response:  Normal blood pressure response. Clinical Symptoms:  No significant symptoms noted. ECG Impression:  No significant ST segment change suggestive of ischemia. Comparison with Prior Nuclear Study: No evidence of Inferolateral ischemia  Overall Impression:  Low risk stress nuclear study Mild anterior ischemia.  LV Wall Motion:  Severe Global LV dysfunction   Runell GessJonathan J Klarisa Barman, MD  06/28/2013 1:38 PM

## 2013-07-07 NOTE — Telephone Encounter (Signed)
Encounter Closed--TP 07/07/2013 

## 2013-07-09 NOTE — Progress Notes (Signed)
Needs ROV with Vernona Rieger on a day when I'm in the office as well, soon, to discuss Myoview results.  JJB

## 2013-07-19 ENCOUNTER — Other Ambulatory Visit: Payer: Self-pay | Admitting: *Deleted

## 2013-07-19 MED ORDER — RANOLAZINE ER 500 MG PO TB12: 500.0000 mg | ORAL_TABLET | Freq: Every morning | ORAL | Status: DC

## 2013-07-19 NOTE — Telephone Encounter (Signed)
Rx was sent to pharmacy electronically. 

## 2013-07-20 ENCOUNTER — Other Ambulatory Visit: Payer: Self-pay | Admitting: *Deleted

## 2013-07-20 NOTE — Telephone Encounter (Signed)
Rx refill was filled yesterday

## 2013-07-22 ENCOUNTER — Other Ambulatory Visit: Payer: Self-pay | Admitting: *Deleted

## 2013-07-28 DIAGNOSIS — N185 Chronic kidney disease, stage 5: Secondary | ICD-10-CM | POA: Diagnosis not present

## 2013-07-28 DIAGNOSIS — D631 Anemia in chronic kidney disease: Secondary | ICD-10-CM | POA: Diagnosis not present

## 2013-08-09 ENCOUNTER — Ambulatory Visit (INDEPENDENT_AMBULATORY_CARE_PROVIDER_SITE_OTHER): Payer: Medicare Other | Admitting: Cardiovascular Disease

## 2013-08-09 ENCOUNTER — Encounter: Payer: Self-pay | Admitting: Cardiovascular Disease

## 2013-08-09 VITALS — BP 139/78 | HR 61 | Ht 72.0 in | Wt 252.0 lb

## 2013-08-09 DIAGNOSIS — Z9861 Coronary angioplasty status: Secondary | ICD-10-CM | POA: Diagnosis not present

## 2013-08-09 DIAGNOSIS — E785 Hyperlipidemia, unspecified: Secondary | ICD-10-CM

## 2013-08-09 DIAGNOSIS — I10 Essential (primary) hypertension: Secondary | ICD-10-CM

## 2013-08-09 DIAGNOSIS — I209 Angina pectoris, unspecified: Secondary | ICD-10-CM

## 2013-08-09 DIAGNOSIS — Z955 Presence of coronary angioplasty implant and graft: Secondary | ICD-10-CM

## 2013-08-09 NOTE — Assessment & Plan Note (Signed)
On statin therapy followed by his nephrologist and PCP. The left profile in our chart performed 11/10/12 revealed a total cholesterol of 188 with elevated triglycerides and HDL of 38.

## 2013-08-09 NOTE — Patient Instructions (Signed)
We request that you follow-up in: 6 months with Laura Ingold NP and in 1 year with Dr Berry  You will receive a reminder letter in the mail two months in advance. If you don't receive a letter, please call our office to schedule the follow-up appointment.   

## 2013-08-09 NOTE — Assessment & Plan Note (Signed)
Controlled on current medications 

## 2013-08-09 NOTE — Assessment & Plan Note (Signed)
History of CAD status post coronary artery bypass grafting 04/01/11 by Dr. Kathlee Nations Trigt with LIMA to his LAD, vein to the obtuse marginal branch 1 and 2 sequentially and to the posterior lateral branch. His EF was normal postoperatively.he did have coronary intervention performed by Dr. Tresa Endo subsequent to that time with intervention to an OM graft insertion in the vessel that was too small to stent with PCI as well as stenting of the LAD beyond the LIMA insertion. He saw Nada Boozer nurse practitioner in the office last month having complaint of 3 episodes of stabbing chest pain". A Myoview stress test was performed that showed mild anteroapical ischemia on 06/28/13. He's had no recurrent symptoms. He does have stage V chronic renal insufficiency with a maturing AV fistula. He sees Dr. Elvis Coil  monthly. At this point, I told her conservative therapy was having intervention with exposure to nephrotoxic agents for recurrent unstable symptoms.

## 2013-08-09 NOTE — Progress Notes (Signed)
08/09/2013 [name]   03/02/1961  [id number]  Primary Physician Gregory BarreJames John, MD Primary Cardiologist: Gregory GessJonathan J. Lakea Mittelman MD [name]   HPI:  The patient is Gregory 53 year old, mild to moderately overweight, divorced, African American male, father of one 53-year-old son who is accompanied by his sister today. I saw him 6 weeks ago. He has Gregory history of CAD status post bypass grafting by Dr. Kathlee NationsPeter Van Glenn April 01, 2011 with Gregory LIMA to his LAD, Gregory vein to an obtuse marginal branch 1 and 2, and to the posterolateral branch. His EF was apparently normal pre and postoperatively. He also has difficult to control hypertension, chronic renal insufficiency, now stage 5, for which he sees [name]. He is scheduled to have an AV fistula placed by Dr. Imogene Glenn sometime in the next several weeks in anticipation of dialysis at some point in the future.   He was admitted in April with substernal chest pain and anterolateral T-wave inversion and was cathed and intervened on by Dr. Tresa Glenn. He had PCI of his OM vein graft insertion which was too small to stent, and stenting of the LAD beyond his LIMA insertion.  I last saw him 12/22/12. He saw Gregory BoozerLaura Glenn 06/24/13 with complaints of 2 episodes of atypical "stabbing chest pain. Gregory Myoview stress test performed 06/28/13 showed mild anteroapical ischemia. He has had no recurrent symptoms. He has not yet begun hemodialysis.    Current Outpatient Prescriptions  Medication Sig Dispense Refill  . acetaminophen (TYLENOL) 325 MG tablet Take 650 mg by mouth every 6 (six) hours as needed for pain. For pain      . aspirin EC 81 MG EC tablet Take 1 tablet (81 mg total) by mouth daily.      . cloNIDine (CATAPRES) 0.2 MG tablet Take 0.2 mg by mouth 2 (two) times daily.       . clopidogrel (PLAVIX) 75 MG tablet Take 1 tablet (75 mg total) by mouth daily.  30 tablet  11  . febuxostat (ULORIC) 40 MG tablet Take 40 mg by mouth 2 (two) times daily.      .  fluocinonide cream (LIDEX) 0.05 % Apply 1 application topically 2 (two) times daily.      . furosemide (LASIX) 40 MG tablet Take 1 tablet (40 mg total) by mouth daily.  90 tablet  3  . hydrALAZINE (APRESOLINE) 10 MG tablet Take 10 mg by mouth 2 (two) times daily.      . isosorbide mononitrate (IMDUR) 30 MG 24 hr tablet Take 1 tablet (30 mg total) by mouth daily.  30 tablet  6  . metoprolol (LOPRESSOR) 50 MG tablet TAKE 1 TABLET BY MOUTH TWICE Gregory DAY  180 tablet  3  . NITROSTAT 0.4 MG SL tablet PLACE 1 TABLET UNDER TONGUE AS NEEDED FOR CHEST PAIN EVERY 5 MINUTES (MAX 3DOSES)  25 tablet  4  . Omega-3 Fatty Acids (FISH OIL) 1200 MG CAPS Take 1 capsule by mouth 3 (three) times daily.      Marland Kitchen. oxyCODONE (ROXICODONE) 5 MG immediate release tablet Take 1 tablet (5 mg total) by mouth every 4 (four) hours as needed for pain.  30 tablet  0  . polysaccharide iron (NIFEREX) 150 MG CAPS capsule Take 150 mg by mouth daily.      . ranolazine (RANEXA) 500 MG 12 hr tablet Take 1 tablet (500 mg total) by mouth every morning.  90 tablet  1  . rosuvastatin (CRESTOR) 20  MG tablet Take 1 tablet (20 mg total) by mouth every evening.  28 tablet  0  . terazosin (HYTRIN) 10 MG capsule TAKE 1 CAPSULE BY MOUTH AT BEDTIME.  90 capsule  3  . Ticagrelor (BRILINTA) 90 MG TABS tablet Take 1 tablet (90 mg total) by mouth 2 (two) times daily.  180 tablet  1  . triamcinolone cream (KENALOG) 0.1 % Apply 1 application topically as needed.       . zolpidem (AMBIEN) 10 MG tablet Take 1 tablet (10 mg total) by mouth at bedtime as needed for sleep.  30 tablet  5   No current facility-administered medications for this visit.    Allergies  Allergen Reactions  . Shrimp [Shellfish Allergy] Shortness Of Breath  . Atorvastatin Other (See Comments)    REACTION: weakness  . Insulins Other (See Comments)    Patient unsure as to the kind of insulin but states that it has previously caused seizures. This occurred in the hospital in 2006; but in  most recent hospitalization (Jan 2013) received insulin but did not have reaction  . Simvastatin Other (See Comments)    REACTION: abnormal liver tests  . Ultram [Tramadol]     History   Social History  . Marital Status: Divorced    Spouse Name: N/Gregory    Number of Children: N/Gregory  . Years of Education: N/Gregory   Occupational History  . Not on file.   Social History Main Topics  . Smoking status: Never Smoker   . Smokeless tobacco: Never Used  . Alcohol Use: Yes     Comment: rarely  . Drug Use: No  . Sexual Activity: Not Currently   Other Topics Concern  . Not on file   Social History Narrative  . No narrative on file     Review of Systems: General: negative for chills, fever, night sweats or weight changes.  Cardiovascular: negative for chest pain, dyspnea on exertion, edema, orthopnea, palpitations, paroxysmal nocturnal dyspnea or shortness of breath Dermatological: negative for rash Respiratory: negative for cough or wheezing Urologic: negative for hematuria Abdominal: negative for nausea, vomiting, diarrhea, bright red blood per rectum, melena, or hematemesis Neurologic: negative for visual changes, syncope, or dizziness All other systems reviewed and are otherwise negative except as noted above.    Blood pressure 139/78, pulse 61, height 6' (1.829 m), weight 252 lb (114.306 kg).  General appearance: alert and no distress Neck: no adenopathy, no JVD, supple, symmetrical, trachea midline, thyroid not enlarged, symmetric, no tenderness/mass/nodules and 2 and for Gregory left carotid bruit probably secondary to his AV fistula Lungs: clear to auscultation bilaterally Heart: regular rate and rhythm, S1, S2 normal, no murmur, click, rub or gallop Extremities: extremities normal, atraumatic, no cyanosis or edema  EKG not performed today  ASSESSMENT AND PLAN:   S/P coronary artery stent placement, PTCA/DES Resolute to mid-LAD via the LIMA graft, and PTCA of apical 95% stenosis  06/2011 History of CAD status post coronary artery bypass grafting 04/01/11 by Dr. Kathlee Nations Glenn with LIMA to his LAD, vein to the obtuse marginal branch 1 and 2 sequentially and to the posterior lateral branch. His EF was normal postoperatively.he did have coronary intervention performed by Dr. Tresa Endo subsequent to that time with intervention to an OM graft insertion in the vessel that was too small to stent with PCI as well as stenting of the LAD beyond the LIMA insertion. He saw [name] nurse practitioner in the office last month having complaint of  3 episodes of stabbing chest pain". Gregory Myoview stress test was performed that showed mild anteroapical ischemia on 06/28/13. He's had no recurrent symptoms. He does have stage V chronic renal insufficiency with Gregory maturing AV fistula. He sees Dr. Elvis Coil  monthly. At this point, I told her conservative therapy was having intervention with exposure to nephrotoxic agents for recurrent unstable symptoms.  HYPERTENSION, accelerated Controlled on current medications  HYPERLIPIDEMIA On statin therapy followed by his nephrologist and PCP. The left profile in our chart performed 11/10/12 revealed Gregory total cholesterol of 188 with elevated triglycerides and HDL of 38.      Gregory Gess MD FACP,FACC,FAHA, [facility] 08/09/2013 10:03 AM

## 2013-08-12 ENCOUNTER — Other Ambulatory Visit: Payer: Self-pay

## 2013-08-12 MED ORDER — ZOLPIDEM TARTRATE 10 MG PO TABS: 10.0000 mg | ORAL_TABLET | Freq: Every evening | ORAL | Status: DC | PRN

## 2013-08-12 NOTE — Telephone Encounter (Signed)
Faxed hardcopy to [facility] [place] 

## 2013-08-12 NOTE — Telephone Encounter (Signed)
Done hardcopy to [facility]  

## 2013-08-26 ENCOUNTER — Other Ambulatory Visit: Payer: Self-pay | Admitting: Internal Medicine

## 2013-08-26 ENCOUNTER — Other Ambulatory Visit: Payer: Self-pay | Admitting: Cardiovascular Disease

## 2013-09-01 DIAGNOSIS — N185 Chronic kidney disease, stage 5: Secondary | ICD-10-CM | POA: Diagnosis not present

## 2013-09-01 DIAGNOSIS — N189 Chronic kidney disease, unspecified: Secondary | ICD-10-CM | POA: Diagnosis not present

## 2013-09-01 DIAGNOSIS — D631 Anemia in chronic kidney disease: Secondary | ICD-10-CM | POA: Diagnosis not present

## 2013-11-01 DIAGNOSIS — D631 Anemia in chronic kidney disease: Secondary | ICD-10-CM | POA: Diagnosis not present

## 2013-11-01 DIAGNOSIS — N2581 Secondary hyperparathyroidism of renal origin: Secondary | ICD-10-CM | POA: Diagnosis not present

## 2013-11-01 DIAGNOSIS — I120 Hypertensive chronic kidney disease with stage 5 chronic kidney disease or end stage renal disease: Secondary | ICD-10-CM | POA: Diagnosis not present

## 2013-11-01 DIAGNOSIS — N185 Chronic kidney disease, stage 5: Secondary | ICD-10-CM | POA: Diagnosis not present

## 2013-11-08 ENCOUNTER — Ambulatory Visit: Payer: Medicare Other | Admitting: Internal Medicine

## 2013-11-08 DIAGNOSIS — N039 Chronic nephritic syndrome with unspecified morphologic changes: Secondary | ICD-10-CM | POA: Diagnosis not present

## 2013-11-08 DIAGNOSIS — D509 Iron deficiency anemia, unspecified: Secondary | ICD-10-CM | POA: Diagnosis not present

## 2013-11-08 DIAGNOSIS — N186 End stage renal disease: Secondary | ICD-10-CM | POA: Diagnosis not present

## 2013-11-08 DIAGNOSIS — D631 Anemia in chronic kidney disease: Secondary | ICD-10-CM | POA: Diagnosis not present

## 2013-11-09 ENCOUNTER — Encounter: Payer: Self-pay | Admitting: Internal Medicine

## 2013-11-09 ENCOUNTER — Ambulatory Visit (INDEPENDENT_AMBULATORY_CARE_PROVIDER_SITE_OTHER): Payer: Medicare Other

## 2013-11-09 ENCOUNTER — Ambulatory Visit (INDEPENDENT_AMBULATORY_CARE_PROVIDER_SITE_OTHER): Payer: Medicare Other | Admitting: Internal Medicine

## 2013-11-09 VITALS — BP 138/82 | HR 65 | Temp 98.3°F | Wt 247.8 lb

## 2013-11-09 DIAGNOSIS — Z23 Encounter for immunization: Secondary | ICD-10-CM

## 2013-11-09 DIAGNOSIS — N320 Bladder-neck obstruction: Secondary | ICD-10-CM

## 2013-11-09 DIAGNOSIS — R7309 Other abnormal glucose: Secondary | ICD-10-CM | POA: Diagnosis not present

## 2013-11-09 DIAGNOSIS — R7302 Impaired glucose tolerance (oral): Secondary | ICD-10-CM

## 2013-11-09 DIAGNOSIS — E785 Hyperlipidemia, unspecified: Secondary | ICD-10-CM | POA: Diagnosis not present

## 2013-11-09 DIAGNOSIS — I209 Angina pectoris, unspecified: Secondary | ICD-10-CM | POA: Diagnosis not present

## 2013-11-09 DIAGNOSIS — I10 Essential (primary) hypertension: Secondary | ICD-10-CM | POA: Diagnosis not present

## 2013-11-09 LAB — CBC WITH DIFFERENTIAL/PLATELET
BASOS PCT: 0.9 % (ref 0.0–3.0)
Basophils Absolute: 0.1 10*3/uL (ref 0.0–0.1)
EOS ABS: 0.3 10*3/uL (ref 0.0–0.7)
EOS PCT: 5.1 % — AB (ref 0.0–5.0)
HCT: 32.7 % — ABNORMAL LOW (ref 39.0–52.0)
HEMOGLOBIN: 10.7 g/dL — AB (ref 13.0–17.0)
LYMPHS PCT: 16.7 % (ref 12.0–46.0)
Lymphs Abs: 1.1 10*3/uL (ref 0.7–4.0)
MCHC: 32.6 g/dL (ref 30.0–36.0)
MCV: 93.2 fl (ref 78.0–100.0)
Monocytes Absolute: 0.7 10*3/uL (ref 0.1–1.0)
Monocytes Relative: 10.4 % (ref 3.0–12.0)
NEUTROS ABS: 4.4 10*3/uL (ref 1.4–7.7)
NEUTROS PCT: 66.9 % (ref 43.0–77.0)
Platelets: 259 10*3/uL (ref 150.0–400.0)
RBC: 3.51 Mil/uL — AB (ref 4.22–5.81)
RDW: 14.1 % (ref 11.5–15.5)
WBC: 6.6 10*3/uL (ref 4.0–10.5)

## 2013-11-09 LAB — HEMOGLOBIN A1C: HEMOGLOBIN A1C: 5.4 % (ref 4.6–6.5)

## 2013-11-09 NOTE — Progress Notes (Signed)
Pre visit review using our clinic review tool, if applicable. No additional management support is needed unless otherwise documented below in the visit note. 

## 2013-11-09 NOTE — Assessment & Plan Note (Signed)
stable overall by history and exam, recent data reviewed with pt, and pt to continue medical treatment as before,  to f/u any worsening symptoms or concerns Lab Results  Component Value Date   LDLCALC 53 07/01/2011   For f/u labs

## 2013-11-09 NOTE — Assessment & Plan Note (Signed)
Asympt, for a1c, o/w stable overall by history and exam, recent data reviewed with pt, and pt to continue medical treatment as before,  to f/u any worsening symptoms or concerns . Lab Results  Component Value Date   HGBA1C 5.7 11/10/2012

## 2013-11-09 NOTE — Assessment & Plan Note (Signed)
stable overall by history and exam, recent data reviewed with pt, and pt to continue medical treatment as before,  to f/u any worsening symptoms or concerns BP Readings from Last 3 Encounters:  11/09/13 138/82  08/09/13 139/78  06/24/13 130/80

## 2013-11-09 NOTE — Assessment & Plan Note (Signed)
Also for psa as he is due 

## 2013-11-09 NOTE — Patient Instructions (Addendum)
You had the flu shot today  Please continue all other medications as before, and refills have been done if requested.  Please have the pharmacy call with any other refills you may need.  Please continue your efforts at being more active, low cholesterol diet, and weight control.  You are otherwise up to date with prevention measures today.  Please keep your appointments with your specialists as you may have planned - dialisys  Please go to the LAB in the Basement (turn left off the elevator) for the tests to be done today  You will be contacted by phone if any changes need to be made immediately.  Otherwise, you will receive a letter about your results with an explanation, but please check with MyChart first.  Please remember to sign up for MyChart if you have not done so, as this will be important to you in the future with finding out test results, communicating by private email, and scheduling acute appointments online when needed.  Please return in 1 year for your yearly visit, or sooner if needed

## 2013-11-09 NOTE — Progress Notes (Signed)
Subjective:    Patient ID: [name], male    DOB: [birth year], 53 y.o.   MRN: [id number]  HPI  Here for yearly f/u;  Overall doing ok;  Pt denies CP, worsening SOB, DOE, wheezing, orthopnea, PND, worsening LE edema, palpitations, dizziness or syncope.  Pt denies neurological change such as new headache, facial or extremity weakness.  Pt denies polydipsia, polyuria, or low sugar symptoms. Pt states overall good compliance with treatment and medications, good tolerability, and has been trying to follow lower cholesterol diet.  Pt denies worsening depressive symptoms, suicidal ideation or panic. No fever, night sweats, wt loss, loss of appetite, or other constitutional symptoms.  Pt states good ability with ADL's, has low fall risk, home safety reviewed and adequate, no other significant changes in hearing or vision, and only occasionally active with exercise. Past Medical History  Diagnosis Date  . ACHALASIA   . CEREBROVASCULAR ACCIDENT, HX OF   . Headache(784.0)   . HYPERLIPIDEMIA     takes crestor daily  . RENAL CALCULUS, HX OF   . RENAL INSUFFICIENCY   . H/O hiatal hernia   . CAD (coronary artery disease), 3 vessel significant disease.    . Impaired glucose tolerance   . S/P PTCA (percutaneous transluminal coronary angioplasty), 06/30/11 07/01/2011  . CKD (chronic kidney disease) stage 4, GFR 15-29 ml/min   . History of diastolic dysfunction, grade 2 by echo   . S/P coronary artery stent placement, PTCA/DES Resolute to mid-LAD via the LIMA graft, and PTCA of apical 95% stenosis 07/05/11 07/04/2011  . DEPRESSION   . Sleep apnea     SLEEP STUDY IN [place] , NO MACHINE YET  1 YEAR  . Seizures     INSULIN INDUCED  . ANEMIA-NOS   . NSTEMI (non-ST elevated myocardial infarction), 06/30/11 07/01/2011  . Stroke     93/2005/2006/2007;left sided weakness  . DEGENERATIVE JOINT DISEASE, SHOULDER     knee  . Gout     takes Uloric daily  . GERD     no meds required  . History of colon polyps    . HYPERTENSION     takes Norvasc,Catapress,Hydralazine,Imdur,and Metoprolol daily  . Insomnia     takes Ambien nightly  . Hyperlipidemia    Past Surgical History  Procedure Laterality Date  . Tonsillectomy    . Stomach surgery  2009    achalasia  . Knee surgery  01/07/2011    right  . Insertion of dialysis catheter  04/01/2011    Procedure: INSERTION OF DIALYSIS CATHETER;  Surgeon: Nilda Simmer, MD;  Location: [facility] OR;  Service: Vascular;  Laterality: N/A;  . Cardiac catheterization    . Av fistula placement  12/02/2011    Procedure: ARTERIOVENOUS (AV) FISTULA CREATION;  Surgeon: Fransisco Hertz, MD;  Location: [facility] OR;  Service: Vascular;  Laterality: Left;  Ultrasound Guided  . Subclavian stent placement  12/20/2011  . Coronary artery bypass graft  04/01/2011    Procedure: CORONARY ARTERY BYPASS GRAFTING (CABG);  Surgeon: Kathlee Nations Suann Larry, MD;  Location: [facility] OR;  Service: Open Heart Surgery;  Laterality: N/A;  . Coronary angioplasty      06/30/11   AT [facility]  . Colonoscopy    . Hyperlipidemia    . Ligation of arteriovenous  fistula Left 06/08/2012    Procedure: LIGATION OF ARTERIOVENOUS  FISTULA;  Surgeon: Fransisco Hertz, MD;  Location: [facility] OR;  Service: Vascular;  Laterality: Left;  . Av fistula placement Left  06/08/2012    Procedure: ARTERIOVENOUS (AV) FISTULA CREATION;  Surgeon: Fransisco Hertz, MD;  Location: [facility] OR;  Service: Vascular;  Laterality: Left;    reports that he has never smoked. He has never used smokeless tobacco. He reports that he drinks alcohol. He reports that he does not use illicit drugs. family history includes Cancer in his father and mother; Heart disease in his father and mother; Hyperlipidemia in his mother and other; Hypertension in his mother and other; Stroke in his other. Allergies  Allergen Reactions  . Shrimp [Shellfish Allergy] Shortness Of Breath  . Atorvastatin Other (See Comments)    REACTION: weakness  . Insulins Other (See Comments)    Patient  unsure as to the kind of insulin but states that it has previously caused seizures. This occurred in the hospital in 2006; but in most recent hospitalization (Jan 2013) received insulin but did not have reaction  . Simvastatin Other (See Comments)    REACTION: abnormal liver tests  . Ultram [Tramadol]    Current Outpatient Prescriptions on File Prior to Visit  Medication Sig Dispense Refill  . acetaminophen (TYLENOL) 325 MG tablet Take 650 mg by mouth every 6 (six) hours as needed for pain. For pain      . aspirin EC 81 MG EC tablet Take 1 tablet (81 mg total) by mouth daily.      . cloNIDine (CATAPRES) 0.2 MG tablet Take 0.2 mg by mouth 2 (two) times daily.       . clopidogrel (PLAVIX) 75 MG tablet TAKE 1 TABLET (75 MG TOTAL) BY MOUTH DAILY.  30 tablet  11  . febuxostat (ULORIC) 40 MG tablet Take 40 mg by mouth 2 (two) times daily.      . fluocinonide cream (LIDEX) 0.05 % Apply 1 application topically 2 (two) times daily.      . hydrALAZINE (APRESOLINE) 10 MG tablet TAKE 1 TABLET BY MOUTH 2 TIMES A DAY  270 tablet  6  . isosorbide mononitrate (IMDUR) 30 MG 24 hr tablet Take 1 tablet (30 mg total) by mouth daily.  30 tablet  6  . metoprolol (LOPRESSOR) 50 MG tablet TAKE 1 TABLET BY MOUTH TWICE A DAY  180 tablet  3  . NITROSTAT 0.4 MG SL tablet PLACE 1 TABLET UNDER TONGUE AS NEEDED FOR CHEST PAIN EVERY 5 MINUTES (MAX 3DOSES)  25 tablet  4  . Omega-3 Fatty Acids (FISH OIL) 1200 MG CAPS Take 1 capsule by mouth 3 (three) times daily.      Marland Kitchen oxyCODONE (ROXICODONE) 5 MG immediate release tablet Take 1 tablet (5 mg total) by mouth every 4 (four) hours as needed for pain.  30 tablet  0  . polysaccharide iron (NIFEREX) 150 MG CAPS capsule Take 150 mg by mouth daily.      . ranolazine (RANEXA) 500 MG 12 hr tablet Take 1 tablet (500 mg total) by mouth every morning.  90 tablet  1  . rosuvastatin (CRESTOR) 20 MG tablet Take 1 tablet (20 mg total) by mouth every evening.  28 tablet  0  . terazosin (HYTRIN)  10 MG capsule TAKE 1 CAPSULE BY MOUTH AT BEDTIME.  90 capsule  3  . Ticagrelor (BRILINTA) 90 MG TABS tablet Take 1 tablet (90 mg total) by mouth 2 (two) times daily.  180 tablet  1  . triamcinolone cream (KENALOG) 0.1 % Apply 1 application topically as needed.       . zolpidem (AMBIEN) 10 MG tablet Take 1  tablet (10 mg total) by mouth at bedtime as needed for sleep.  30 tablet  5   No current facility-administered medications on file prior to visit.    Review of Systems Constitutional: Negative for increased diaphoresis, other activity, appetite or other siginficant weight change  HENT: Negative for worsening hearing loss, ear pain, facial swelling, mouth sores and neck stiffness.   Eyes: Negative for other worsening pain, redness or visual disturbance.  Respiratory: Negative for shortness of breath and wheezing.   Cardiovascular: Negative for chest pain and palpitations.  Gastrointestinal: Negative for diarrhea, blood in stool, abdominal distention or other pain Genitourinary: Negative for hematuria, flank pain or change in urine volume.  Musculoskeletal: Negative for myalgias or other joint complaints.  Skin: Negative for color change and wound.  Neurological: Negative for syncope and numbness. other than noted Hematological: Negative for adenopathy. or other swelling Psychiatric/Behavioral: Negative for hallucinations, self-injury, decreased concentration or other worsening agitation. ]    Objective:   Physical Exam BP 138/82  Pulse 65  Temp(Src) 98.3 F (36.8 C) (Oral)  Wt 247 lb 12 oz (112.379 kg)  SpO2 97% VS noted,  Constitutional: Pt is oriented to person, place, and time. Appears well-developed and well-nourished.  Head: Normocephalic and atraumatic.  Right Ear: External ear normal.  Left Ear: External ear normal.  Nose: Nose normal.  Mouth/Throat: Oropharynx is clear and moist.  Eyes: Conjunctivae and EOM are normal. Pupils are equal, round, and reactive to light.    Neck: Normal range of motion. Neck supple. No JVD present. No tracheal deviation present.  Cardiovascular: Normal rate, regular rhythm, normal heart sounds and intact distal pulses.   Pulmonary/Chest: Effort normal and breath sounds without rales or wheezing  Abdominal: Soft. Bowel sounds are normal. NT. No HSM  Musculoskeletal: Normal range of motion. Exhibits no edema.  Lymphadenopathy:  Has no cervical adenopathy.  Neurological: Pt is alert and oriented to person, place, and time. Pt has normal reflexes. No cranial nerve deficit. Motor grossly intact Skin: Skin is warm and dry. No rash noted.  Psychiatric:  Has normal mood and affect. Behavior is normal.     Assessment & Plan:

## 2013-11-10 ENCOUNTER — Telehealth: Payer: Self-pay

## 2013-11-10 ENCOUNTER — Other Ambulatory Visit: Payer: Self-pay | Admitting: Internal Medicine

## 2013-11-10 DIAGNOSIS — N039 Chronic nephritic syndrome with unspecified morphologic changes: Secondary | ICD-10-CM | POA: Diagnosis not present

## 2013-11-10 DIAGNOSIS — D509 Iron deficiency anemia, unspecified: Secondary | ICD-10-CM | POA: Diagnosis not present

## 2013-11-10 DIAGNOSIS — R972 Elevated prostate specific antigen [PSA]: Secondary | ICD-10-CM

## 2013-11-10 DIAGNOSIS — N186 End stage renal disease: Secondary | ICD-10-CM | POA: Diagnosis not present

## 2013-11-10 DIAGNOSIS — D631 Anemia in chronic kidney disease: Secondary | ICD-10-CM | POA: Diagnosis not present

## 2013-11-10 LAB — TSH: TSH: 1.14 u[IU]/mL (ref 0.35–4.50)

## 2013-11-10 LAB — BASIC METABOLIC PANEL
BUN: 36 mg/dL — AB (ref 6–23)
CO2: 23 meq/L (ref 19–32)
CREATININE: 7.3 mg/dL — AB (ref 0.4–1.5)
Calcium: 9 mg/dL (ref 8.4–10.5)
Chloride: 109 mEq/L (ref 96–112)
GFR: 10.07 mL/min — CL (ref >60.00)
Glucose, Bld: 93 mg/dL (ref 70–99)
Potassium: 4 mEq/L (ref 3.5–5.1)
Sodium: 140 mEq/L (ref 135–145)

## 2013-11-10 LAB — PSA: PSA: 5.87 ng/mL — AB (ref 0.10–4.00)

## 2013-11-10 LAB — HEPATIC FUNCTION PANEL
ALT: 13 U/L (ref 0–53)
AST: 15 U/L (ref 0–37)
Albumin: 3.7 g/dL (ref 3.5–5.2)
Alkaline Phosphatase: 42 U/L (ref 39–117)
BILIRUBIN DIRECT: 0.2 mg/dL (ref 0.0–0.3)
BILIRUBIN TOTAL: 0.9 mg/dL (ref 0.2–1.2)
TOTAL PROTEIN: 6.5 g/dL (ref 6.0–8.3)

## 2013-11-10 LAB — LIPID PANEL
Cholesterol: 123 mg/dL (ref 0–200)
HDL: 32.5 mg/dL — AB (ref >39.00)
LDL Cholesterol: 66 mg/dL (ref 0–99)
NONHDL: 90.5
TRIGLYCERIDES: 121 mg/dL (ref 0.0–149.0)
Total CHOL/HDL Ratio: 4
VLDL: 24.2 mg/dL (ref 0.0–40.0)

## 2013-11-10 NOTE — Telephone Encounter (Signed)
Critical Lab  Creatinine 7.33 GFR 10.07 BUN 36

## 2013-11-12 DIAGNOSIS — D631 Anemia in chronic kidney disease: Secondary | ICD-10-CM | POA: Diagnosis not present

## 2013-11-12 DIAGNOSIS — N186 End stage renal disease: Secondary | ICD-10-CM | POA: Diagnosis not present

## 2013-11-12 DIAGNOSIS — D509 Iron deficiency anemia, unspecified: Secondary | ICD-10-CM | POA: Diagnosis not present

## 2013-11-12 DIAGNOSIS — N039 Chronic nephritic syndrome with unspecified morphologic changes: Secondary | ICD-10-CM | POA: Diagnosis not present

## 2013-11-15 DIAGNOSIS — Z23 Encounter for immunization: Secondary | ICD-10-CM | POA: Diagnosis not present

## 2013-11-15 DIAGNOSIS — N186 End stage renal disease: Secondary | ICD-10-CM | POA: Diagnosis not present

## 2013-11-15 DIAGNOSIS — N039 Chronic nephritic syndrome with unspecified morphologic changes: Secondary | ICD-10-CM | POA: Diagnosis not present

## 2013-11-15 DIAGNOSIS — N2581 Secondary hyperparathyroidism of renal origin: Secondary | ICD-10-CM | POA: Diagnosis not present

## 2013-11-15 DIAGNOSIS — D509 Iron deficiency anemia, unspecified: Secondary | ICD-10-CM | POA: Diagnosis not present

## 2013-11-15 DIAGNOSIS — D631 Anemia in chronic kidney disease: Secondary | ICD-10-CM | POA: Diagnosis not present

## 2013-11-22 ENCOUNTER — Telehealth: Payer: Self-pay | Admitting: *Deleted

## 2013-11-22 NOTE — Telephone Encounter (Signed)
Left smg on triage stating pt stated he received his flu shot from Dr. Jonny Ruiz back in august. Wanting to get confirmation fax over so they can put on their file that flu shot has been done. Faxed immunization sheet to [phone]...Raechel Chute

## 2013-12-14 DIAGNOSIS — N186 End stage renal disease: Secondary | ICD-10-CM | POA: Diagnosis not present

## 2013-12-15 DIAGNOSIS — N186 End stage renal disease: Secondary | ICD-10-CM | POA: Diagnosis not present

## 2013-12-15 DIAGNOSIS — D631 Anemia in chronic kidney disease: Secondary | ICD-10-CM | POA: Diagnosis not present

## 2013-12-15 DIAGNOSIS — N2581 Secondary hyperparathyroidism of renal origin: Secondary | ICD-10-CM | POA: Diagnosis not present

## 2013-12-15 DIAGNOSIS — Z23 Encounter for immunization: Secondary | ICD-10-CM | POA: Diagnosis not present

## 2013-12-16 DIAGNOSIS — N390 Urinary tract infection, site not specified: Secondary | ICD-10-CM | POA: Diagnosis not present

## 2013-12-16 DIAGNOSIS — R972 Elevated prostate specific antigen [PSA]: Secondary | ICD-10-CM | POA: Diagnosis not present

## 2013-12-21 ENCOUNTER — Telehealth: Payer: Self-pay | Admitting: Neurology

## 2013-12-21 NOTE — Telephone Encounter (Signed)
Patient scheduled coming in to see Dr.Yan cancellation.

## 2013-12-22 ENCOUNTER — Encounter: Payer: Self-pay | Admitting: Neurology

## 2013-12-22 ENCOUNTER — Ambulatory Visit (INDEPENDENT_AMBULATORY_CARE_PROVIDER_SITE_OTHER): Payer: Medicare Other | Admitting: Neurology

## 2013-12-22 VITALS — BP 108/71 | HR 103 | Temp 98.8°F | Ht 71.0 in | Wt 219.0 lb

## 2013-12-22 DIAGNOSIS — I208 Other forms of angina pectoris: Secondary | ICD-10-CM | POA: Diagnosis not present

## 2013-12-22 DIAGNOSIS — E785 Hyperlipidemia, unspecified: Secondary | ICD-10-CM

## 2013-12-22 DIAGNOSIS — R269 Unspecified abnormalities of gait and mobility: Secondary | ICD-10-CM | POA: Diagnosis not present

## 2013-12-22 DIAGNOSIS — I63031 Cerebral infarction due to thrombosis of right carotid artery: Secondary | ICD-10-CM

## 2013-12-22 NOTE — Progress Notes (Signed)
PATIENT: Gregory CannerMichael B Brager DOB: 09/22/1960  HISTORICAL  [name] is a 53 years old right-handed African American male, is referred by his primary care physician Dr. Shary KeyJames Jones for evaluation of left side weakness, gait difficulty  He had past medical history of poorly controlled diabetes, end-stage renal disease, started dialysis in August 2015, he received dialysis in Tuesday, Thursday, Saturday, through the left upper extremity AV fistula.  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 Chronic hemorrhagic infarction in the left paracentral pontine has progressed, basal ganglion, thalamus, chronic hemorrhagic infarction in the right frontal lobe.  Echocardiogram showed mild concentric left ventricular hypertrophy, ejection fraction was 35%, moderate to severe septal hypokinesia, moderate apical wall hypokinesis.  At baseline, the patient was exercise regularly. Ambulate without gait difficulty, in October third, after his hemodialysis, he took a nap, after woke up around 3 PM October third 2015, he noticed left leg weakness, also left arm weakness, worsening gait difficulty, difficulty lifting weights using his left arm.  He is currently taking aspirin, and Plavix.   REVIEW OF SYSTEMS: Full 14 system review of systems performed and notable only for gait difficulty, left-sided weakness  ALLERGIES: Allergies  Allergen Reactions  . Shrimp [Shellfish Allergy] Shortness Of Breath  . Atorvastatin Other (See Comments)    REACTION: weakness  . Insulins Other (See Comments)    Patient unsure as to the kind of insulin but states that it has previously caused seizures.  This occurred in the hospital in 2006; but in most recent hospitalization (Jan 2013) received insulin but did not have reaction  . Simvastatin Other (See Comments)    REACTION: abnormal liver tests  . Ultram [Tramadol]     HOME MEDICATIONS: Current Outpatient Prescriptions on File Prior to Visit  Medication Sig Dispense Refill  . acetaminophen (TYLENOL) 325 MG tablet Take 650 mg by mouth every 6 (six) hours as needed for pain. For pain      . aspirin EC 81 MG EC tablet Take 1 tablet (81 mg total) by mouth daily.      . cloNIDine (CATAPRES) 0.2 MG tablet Take 0.2 mg by mouth 2 (two) times daily.       . clopidogrel (PLAVIX) 75 MG tablet TAKE 1 TABLET (75 MG TOTAL) BY MOUTH DAILY.  30 tablet  11  . febuxostat (ULORIC) 40 MG tablet Take 40 mg by mouth 2 (two) times daily.      . fluocinonide cream (LIDEX) 0.05 % Apply 1 application topically 2 (two) times daily.      . hydrALAZINE (APRESOLINE) 10 MG tablet TAKE 1 TABLET BY MOUTH 2 TIMES A DAY  270 tablet  6  . isosorbide mononitrate (IMDUR) 30 MG 24 hr tablet Take 1 tablet (30 mg total) by mouth daily.  30 tablet  6  . metoprolol (LOPRESSOR) 50 MG tablet TAKE 1 TABLET BY MOUTH TWICE A DAY  180 tablet  3  . NITROSTAT 0.4 MG SL tablet PLACE 1 TABLET UNDER TONGUE AS NEEDED FOR CHEST PAIN EVERY 5 MINUTES (MAX 3DOSES)  25 tablet  4  . Omega-3 Fatty Acids (FISH OIL) 1200 MG CAPS Take 1 capsule by mouth 3 (three) times daily.      Marland Kitchen. oxyCODONE (  ROXICODONE) 5 MG immediate release tablet Take 1 tablet (5 mg total) by mouth every 4 (four) hours as needed for pain.  30 tablet  0  . polysaccharide iron (NIFEREX) 150 MG CAPS capsule Take 150 mg by mouth daily.      . ranolazine (RANEXA) 500 MG 12 hr tablet Take 1 tablet (500 mg total) by mouth every morning.  90 tablet  1  . rosuvastatin (CRESTOR) 20 MG tablet Take 1 tablet (20 mg total) by mouth every evening.  28 tablet  0  . terazosin (HYTRIN) 10 MG capsule TAKE 1 CAPSULE BY MOUTH AT BEDTIME.  90 capsule   3  . Ticagrelor (BRILINTA) 90 MG TABS tablet Take 1 tablet (90 mg total) by mouth 2 (two) times daily.  180 tablet  1  . triamcinolone cream (KENALOG) 0.1 % Apply 1 application topically as needed.       . zolpidem (AMBIEN) 10 MG tablet Take 1 tablet (10 mg total) by mouth at bedtime as needed for sleep.  30 tablet  5   No current facility-administered medications on file prior to visit.    PAST MEDICAL HISTORY: Past Medical History  Diagnosis Date  . ACHALASIA   . CEREBROVASCULAR ACCIDENT, HX OF   . Headache(784.0)   . HYPERLIPIDEMIA     takes crestor daily  . RENAL CALCULUS, HX OF   . RENAL INSUFFICIENCY   . H/O hiatal hernia   . CAD (coronary artery disease), 3 vessel significant disease.    . Impaired glucose tolerance   . S/P PTCA (percutaneous transluminal coronary angioplasty), 06/30/11 07/01/2011  . CKD (chronic kidney disease) stage 4, GFR 15-29 ml/min   . History of diastolic dysfunction, grade 2 by echo   . S/P coronary artery stent placement, PTCA/DES Resolute to mid-LAD via the LIMA graft, and PTCA of apical 95% stenosis 07/05/11 07/04/2011  . DEPRESSION   . Sleep apnea     SLEEP STUDY IN [place] , NO MACHINE YET  1 YEAR  . Seizures     INSULIN INDUCED  . ANEMIA-NOS   . NSTEMI (non-ST elevated myocardial infarction), 06/30/11 07/01/2011  . Stroke     93/2005/2006/2007;left sided weakness  . DEGENERATIVE JOINT DISEASE, SHOULDER     knee  . Gout     takes Uloric daily  . GERD     no meds required  . History of colon polyps   . HYPERTENSION     takes Norvasc,Catapress,Hydralazine,Imdur,and Metoprolol daily  . Insomnia     takes Ambien nightly  . Hyperlipidemia     PAST SURGICAL HISTORY: Past Surgical History  Procedure Laterality Date  . Tonsillectomy    . Stomach surgery  2009    achalasia  . Knee surgery  01/07/2011    right  . Insertion of dialysis catheter  04/01/2011    Procedure: INSERTION OF DIALYSIS CATHETER;  Surgeon: Nilda Simmer, MD;   Location: [facility] OR;  Service: Vascular;  Laterality: N/A;  . Cardiac catheterization    . Av fistula placement  12/02/2011    Procedure: ARTERIOVENOUS (AV) FISTULA CREATION;  Surgeon: Fransisco Hertz, MD;  Location: [facility] OR;  Service: Vascular;  Laterality: Left;  Ultrasound Guided  . Subclavian stent placement  12/20/2011  . Coronary artery bypass graft  04/01/2011    Procedure: CORONARY ARTERY BYPASS GRAFTING (CABG);  Surgeon: Kathlee Nations Suann Larry, MD;  Location: [facility] OR;  Service: Open Heart Surgery;  Laterality: N/A;  . Coronary angioplasty  06/30/11   AT [facility]  . Colonoscopy    . Hyperlipidemia    . Ligation of arteriovenous  fistula Left 06/08/2012    Procedure: LIGATION OF ARTERIOVENOUS  FISTULA;  Surgeon: Fransisco Hertz, MD;  Location: [facility] OR;  Service: Vascular;  Laterality: Left;  . Av fistula placement Left 06/08/2012    Procedure: ARTERIOVENOUS (AV) FISTULA CREATION;  Surgeon: Fransisco Hertz, MD;  Location: [facility] OR;  Service: Vascular;  Laterality: Left;    FAMILY HISTORY: Family History  Problem Relation Age of Onset  . Hypertension Other   . Stroke Other   . Hyperlipidemia Other   . Cancer Mother   . Heart disease Mother   . Hyperlipidemia Mother   . Hypertension Mother   . Cancer Father   . Heart disease Father     SOCIAL HISTORY:  History   Social History  . Marital Status: Divorced    Spouse Name: N/A    Number of Children: N/A  . Years of Education: N/A   Occupational History  . Not on file.   Social History Main Topics  . Smoking status: Never Smoker   . Smokeless tobacco: Never Used  . Alcohol Use: Yes     Comment: rarely  . Drug Use: No  . Sexual Activity: Not Currently   Other Topics Concern  . Not on file   Social History Narrative  . No narrative on file     PHYSICAL EXAM   Filed Vitals:   12/22/13 1252  BP: 108/71  Pulse: 103  Temp: 98.8 F (37.1 C)  TempSrc: Oral  Height: 5\' 11"  (1.803 m)  Weight: 219 lb (99.338 kg)    Not recorded     Body mass index is 30.56 kg/(m^2).   Generalized: In no acute distress  Neck: Supple, no carotid bruits   Cardiac: Regular rate rhythm  Pulmonary: Clear to auscultation bilaterally  Musculoskeletal: No deformity  Neurological examination  Mentation: Alert oriented to time, place, history taking, and causual conversation  Cranial nerve II-XII: Pupils were equal round reactive to light. Extraocular movements were full.  Visual field were full on confrontational test. Bilateral fundi were sharp.  Facial sensation and strength were normal. Hearing was intact to finger rubbing bilaterally. Uvula tongue midline.  Head turning and shoulder shrug and were normal and symmetric.Tongue protrusion into cheek strength was normal.  Motor: Mild left arm fixation of rapid alternating movement, mild left hip flexion, ankle dorsi flexion weakness,  Sensory: Intact to fine touch, pinprick, preserved vibratory sensation, and proprioception at toes.  Coordination: Normal finger to nose, heel-to-shin bilaterally there was no truncal ataxia  Gait: Mild unsteady, dragging his left leg,  Romberg signs: Negative  Deep tendon reflexes: Brachioradialis 2/2, biceps 2/2, triceps 2/2, patellar 2/2, Achilles 2/2, plantar responses were flexor bilaterally.   DIAGNOSTIC DATA (LABS, IMAGING, TESTING) - I reviewed patient records, labs, notes, testing and imaging myself where available.  Lab Results  Component Value Date   WBC 6.6 11/09/2013   HGB 10.7* 11/09/2013   HCT 32.7* 11/09/2013   MCV 93.2 11/09/2013   PLT 259.0 11/09/2013      Component Value Date/Time   NA 140 11/09/2013 1715   K 4.0 11/09/2013 1715   CL 109 11/09/2013 1715   CO2 23 11/09/2013 1715   GLUCOSE 93 11/09/2013 1715   BUN 36* 11/09/2013 1715   CREATININE 7.3* 11/09/2013 1715   CALCIUM 9.0 11/09/2013 1715   PROT 6.5 11/09/2013 1715   ALBUMIN  3.7 11/09/2013 1715   AST 15 11/09/2013 1715   ALT 13 11/09/2013 1715   ALKPHOS 42 11/09/2013  1715   BILITOT 0.9 11/09/2013 1715   GFRNONAA 16* 02/03/2012 1241   GFRAA 19* 02/03/2012 1241   Lab Results  Component Value Date   CHOL 123 11/09/2013   HDL 32.50* 11/09/2013   LDLCALC 66 11/09/2013   LDLDIRECT 118.1 11/10/2012   TRIG 121.0 11/09/2013   CHOLHDL 4 11/09/2013   Lab Results  Component Value Date   HGBA1C 5.4 11/09/2013   No results found for this basename: [id number]   Lab Results  Component Value Date   TSH 1.14 11/09/2013      ASSESSMENT AND PLAN  KHAREE LESESNE is a 53 y.o. male with multiple vascular risk factors, including previous poorly controlled hypertension, end-stage renal disease, on aspirin, Plavix, previous MRI scan, and a history suggestive of multiple small vessel disease, now presenting with acute onset left arm, and leg weakness, worsening gait difficulty, most consistent with a right subcortical/internal capsule acute stroke.  1, complete evaluation with MRI of brain 2. Keep aspirin Plavix 3. Will refer him to physical therapy 4. Return to clinic in one month   Levert Feinstein, M.D. Ph.D.  [facility] [address], [address] [place], [place] [address] [phone]

## 2013-12-27 ENCOUNTER — Ambulatory Visit: Payer: Self-pay | Admitting: Vascular Surgery

## 2013-12-27 DIAGNOSIS — T829XXD Unspecified complication of cardiac and vascular prosthetic device, implant and graft, subsequent encounter: Secondary | ICD-10-CM | POA: Diagnosis not present

## 2013-12-27 DIAGNOSIS — T82598A Other mechanical complication of other cardiac and vascular devices and implants, initial encounter: Secondary | ICD-10-CM | POA: Diagnosis not present

## 2013-12-27 DIAGNOSIS — Z992 Dependence on renal dialysis: Secondary | ICD-10-CM | POA: Diagnosis not present

## 2013-12-27 DIAGNOSIS — N186 End stage renal disease: Secondary | ICD-10-CM | POA: Diagnosis not present

## 2013-12-27 DIAGNOSIS — I120 Hypertensive chronic kidney disease with stage 5 chronic kidney disease or end stage renal disease: Secondary | ICD-10-CM | POA: Diagnosis not present

## 2013-12-29 ENCOUNTER — Emergency Department (HOSPITAL_COMMUNITY): Payer: Medicare Other

## 2013-12-29 ENCOUNTER — Encounter (HOSPITAL_COMMUNITY): Payer: Self-pay | Admitting: Emergency Medicine

## 2013-12-29 ENCOUNTER — Inpatient Hospital Stay (HOSPITAL_COMMUNITY)
Admission: EM | Admit: 2013-12-29 | Discharge: 2014-01-01 | DRG: 064 | Disposition: A | Discharge status: Home Health | Payer: Medicare Other | Attending: Internal Medicine | Admitting: Internal Medicine

## 2013-12-29 DIAGNOSIS — Z9181 History of falling: Secondary | ICD-10-CM | POA: Diagnosis not present

## 2013-12-29 DIAGNOSIS — Z0000 Encounter for general adult medical examination without abnormal findings: Secondary | ICD-10-CM

## 2013-12-29 DIAGNOSIS — Z8249 Family history of ischemic heart disease and other diseases of the circulatory system: Secondary | ICD-10-CM

## 2013-12-29 DIAGNOSIS — Z823 Family history of stroke: Secondary | ICD-10-CM | POA: Diagnosis not present

## 2013-12-29 DIAGNOSIS — Z7902 Long term (current) use of antithrombotics/antiplatelets: Secondary | ICD-10-CM | POA: Diagnosis not present

## 2013-12-29 DIAGNOSIS — R29898 Other symptoms and signs involving the musculoskeletal system: Secondary | ICD-10-CM

## 2013-12-29 DIAGNOSIS — Z955 Presence of coronary angioplasty implant and graft: Secondary | ICD-10-CM

## 2013-12-29 DIAGNOSIS — Z87442 Personal history of urinary calculi: Secondary | ICD-10-CM

## 2013-12-29 DIAGNOSIS — Z79899 Other long term (current) drug therapy: Secondary | ICD-10-CM | POA: Diagnosis not present

## 2013-12-29 DIAGNOSIS — G4733 Obstructive sleep apnea (adult) (pediatric): Secondary | ICD-10-CM | POA: Diagnosis present

## 2013-12-29 DIAGNOSIS — I63419 Cerebral infarction due to embolism of unspecified middle cerebral artery: Secondary | ICD-10-CM | POA: Diagnosis present

## 2013-12-29 DIAGNOSIS — I2510 Atherosclerotic heart disease of native coronary artery without angina pectoris: Secondary | ICD-10-CM | POA: Diagnosis present

## 2013-12-29 DIAGNOSIS — Z951 Presence of aortocoronary bypass graft: Secondary | ICD-10-CM | POA: Diagnosis not present

## 2013-12-29 DIAGNOSIS — G8194 Hemiplegia, unspecified affecting left nondominant side: Secondary | ICD-10-CM | POA: Diagnosis present

## 2013-12-29 DIAGNOSIS — Z87891 Personal history of nicotine dependence: Secondary | ICD-10-CM

## 2013-12-29 DIAGNOSIS — I63522 Cerebral infarction due to unspecified occlusion or stenosis of left anterior cerebral artery: Principal | ICD-10-CM

## 2013-12-29 DIAGNOSIS — I120 Hypertensive chronic kidney disease with stage 5 chronic kidney disease or end stage renal disease: Secondary | ICD-10-CM | POA: Diagnosis present

## 2013-12-29 DIAGNOSIS — Z992 Dependence on renal dialysis: Secondary | ICD-10-CM | POA: Diagnosis not present

## 2013-12-29 DIAGNOSIS — R7302 Impaired glucose tolerance (oral): Secondary | ICD-10-CM

## 2013-12-29 DIAGNOSIS — Z7982 Long term (current) use of aspirin: Secondary | ICD-10-CM | POA: Diagnosis not present

## 2013-12-29 DIAGNOSIS — N390 Urinary tract infection, site not specified: Secondary | ICD-10-CM

## 2013-12-29 DIAGNOSIS — I63231 Cerebral infarction due to unspecified occlusion or stenosis of right carotid arteries: Secondary | ICD-10-CM | POA: Diagnosis not present

## 2013-12-29 DIAGNOSIS — A419 Sepsis, unspecified organism: Secondary | ICD-10-CM | POA: Diagnosis not present

## 2013-12-29 DIAGNOSIS — N186 End stage renal disease: Secondary | ICD-10-CM

## 2013-12-29 DIAGNOSIS — L299 Pruritus, unspecified: Secondary | ICD-10-CM

## 2013-12-29 DIAGNOSIS — E669 Obesity, unspecified: Secondary | ICD-10-CM | POA: Diagnosis present

## 2013-12-29 DIAGNOSIS — R509 Fever, unspecified: Secondary | ICD-10-CM | POA: Diagnosis not present

## 2013-12-29 DIAGNOSIS — Z9861 Coronary angioplasty status: Secondary | ICD-10-CM

## 2013-12-29 DIAGNOSIS — D631 Anemia in chronic kidney disease: Secondary | ICD-10-CM | POA: Diagnosis present

## 2013-12-29 DIAGNOSIS — I214 Non-ST elevation (NSTEMI) myocardial infarction: Secondary | ICD-10-CM

## 2013-12-29 DIAGNOSIS — I679 Cerebrovascular disease, unspecified: Secondary | ICD-10-CM

## 2013-12-29 DIAGNOSIS — I10 Essential (primary) hypertension: Secondary | ICD-10-CM | POA: Diagnosis not present

## 2013-12-29 DIAGNOSIS — K219 Gastro-esophageal reflux disease without esophagitis: Secondary | ICD-10-CM

## 2013-12-29 DIAGNOSIS — M19019 Primary osteoarthritis, unspecified shoulder: Secondary | ICD-10-CM | POA: Diagnosis present

## 2013-12-29 DIAGNOSIS — K220 Achalasia of cardia: Secondary | ICD-10-CM

## 2013-12-29 DIAGNOSIS — M6289 Other specified disorders of muscle: Secondary | ICD-10-CM

## 2013-12-29 DIAGNOSIS — M25571 Pain in right ankle and joints of right foot: Secondary | ICD-10-CM

## 2013-12-29 DIAGNOSIS — M6281 Muscle weakness (generalized): Secondary | ICD-10-CM | POA: Diagnosis not present

## 2013-12-29 DIAGNOSIS — R6510 Systemic inflammatory response syndrome (SIRS) of non-infectious origin without acute organ dysfunction: Secondary | ICD-10-CM | POA: Diagnosis present

## 2013-12-29 DIAGNOSIS — R29810 Facial weakness: Secondary | ICD-10-CM

## 2013-12-29 DIAGNOSIS — I2089 Other forms of angina pectoris: Secondary | ICD-10-CM

## 2013-12-29 DIAGNOSIS — R531 Weakness: Secondary | ICD-10-CM | POA: Diagnosis not present

## 2013-12-29 DIAGNOSIS — N2581 Secondary hyperparathyroidism of renal origin: Secondary | ICD-10-CM | POA: Diagnosis present

## 2013-12-29 DIAGNOSIS — N320 Bladder-neck obstruction: Secondary | ICD-10-CM

## 2013-12-29 DIAGNOSIS — I252 Old myocardial infarction: Secondary | ICD-10-CM | POA: Diagnosis not present

## 2013-12-29 DIAGNOSIS — Z8679 Personal history of other diseases of the circulatory system: Secondary | ICD-10-CM

## 2013-12-29 DIAGNOSIS — I639 Cerebral infarction, unspecified: Secondary | ICD-10-CM

## 2013-12-29 DIAGNOSIS — I771 Stricture of artery: Secondary | ICD-10-CM

## 2013-12-29 DIAGNOSIS — E785 Hyperlipidemia, unspecified: Secondary | ICD-10-CM | POA: Diagnosis present

## 2013-12-29 DIAGNOSIS — K122 Cellulitis and abscess of mouth: Secondary | ICD-10-CM

## 2013-12-29 DIAGNOSIS — R269 Unspecified abnormalities of gait and mobility: Secondary | ICD-10-CM

## 2013-12-29 DIAGNOSIS — I63031 Cerebral infarction due to thrombosis of right carotid artery: Secondary | ICD-10-CM

## 2013-12-29 DIAGNOSIS — I517 Cardiomegaly: Secondary | ICD-10-CM | POA: Diagnosis not present

## 2013-12-29 DIAGNOSIS — R404 Transient alteration of awareness: Secondary | ICD-10-CM | POA: Diagnosis not present

## 2013-12-29 DIAGNOSIS — R972 Elevated prostate specific antigen [PSA]: Secondary | ICD-10-CM

## 2013-12-29 DIAGNOSIS — I208 Other forms of angina pectoris: Secondary | ICD-10-CM

## 2013-12-29 DIAGNOSIS — Z6829 Body mass index (BMI) 29.0-29.9, adult: Secondary | ICD-10-CM

## 2013-12-29 LAB — COMPREHENSIVE METABOLIC PANEL
ALBUMIN: 3.8 g/dL (ref 3.5–5.2)
ALT: 11 U/L (ref 0–53)
AST: 32 U/L (ref 0–37)
Alkaline Phosphatase: 49 U/L (ref 39–117)
Anion gap: 20 — ABNORMAL HIGH (ref 5–15)
BUN: 23 mg/dL (ref 6–23)
CALCIUM: 9.2 mg/dL (ref 8.4–10.5)
CO2: 26 meq/L (ref 19–32)
Chloride: 90 mEq/L — ABNORMAL LOW (ref 96–112)
Creatinine, Ser: 6.72 mg/dL — ABNORMAL HIGH (ref 0.50–1.35)
GFR calc Af Amer: 10 mL/min — ABNORMAL LOW (ref >90)
GFR, EST NON AFRICAN AMERICAN: 8 mL/min — AB (ref >90)
Glucose, Bld: 166 mg/dL — ABNORMAL HIGH (ref 70–99)
Potassium: 4.5 mEq/L (ref 3.7–5.3)
Sodium: 136 mEq/L — ABNORMAL LOW (ref 137–147)
Total Bilirubin: 0.9 mg/dL (ref 0.3–1.2)
Total Protein: 8 g/dL (ref 6.0–8.3)

## 2013-12-29 LAB — I-STAT CHEM 8, ED
BUN: 28 mg/dL — ABNORMAL HIGH (ref 6–23)
CHLORIDE: 93 meq/L — AB (ref 96–112)
Calcium, Ion: 1.03 mmol/L — ABNORMAL LOW (ref 1.12–1.23)
Creatinine, Ser: 7.1 mg/dL — ABNORMAL HIGH (ref 0.50–1.35)
GLUCOSE: 172 mg/dL — AB (ref 70–99)
HCT: 37 % — ABNORMAL LOW (ref 39.0–52.0)
HEMOGLOBIN: 12.6 g/dL — AB (ref 13.0–17.0)
Potassium: 4.4 mEq/L (ref 3.7–5.3)
SODIUM: 135 meq/L — AB (ref 137–147)
TCO2: 28 mmol/L (ref 0–100)

## 2013-12-29 LAB — PROTIME-INR
INR: 1.27 (ref 0.00–1.49)
PROTHROMBIN TIME: 16 s — AB (ref 11.6–15.2)

## 2013-12-29 LAB — CBC
HCT: 33.1 % — ABNORMAL LOW (ref 39.0–52.0)
Hemoglobin: 11.3 g/dL — ABNORMAL LOW (ref 13.0–17.0)
MCH: 32.1 pg (ref 26.0–34.0)
MCHC: 34.1 g/dL (ref 30.0–36.0)
MCV: 94 fL (ref 78.0–100.0)
PLATELETS: ADEQUATE 10*3/uL (ref 150–400)
RBC: 3.52 MIL/uL — AB (ref 4.22–5.81)
RDW: 14 % (ref 11.5–15.5)
WBC: 19.7 10*3/uL — AB (ref 4.0–10.5)

## 2013-12-29 LAB — DIFFERENTIAL
Basophils Absolute: 0 10*3/uL (ref 0.0–0.1)
Basophils Relative: 0 % (ref 0–1)
EOS PCT: 0 % (ref 0–5)
Eosinophils Absolute: 0 10*3/uL (ref 0.0–0.7)
LYMPHS PCT: 6 % — AB (ref 12–46)
Lymphs Abs: 1.2 10*3/uL (ref 0.7–4.0)
MONOS PCT: 9 % (ref 3–12)
Monocytes Absolute: 1.8 10*3/uL — ABNORMAL HIGH (ref 0.1–1.0)
Neutro Abs: 16.7 10*3/uL — ABNORMAL HIGH (ref 1.7–7.7)
Neutrophils Relative %: 85 % — ABNORMAL HIGH (ref 43–77)
Smear Review: ADEQUATE

## 2013-12-29 LAB — APTT: APTT: 34 s (ref 24–37)

## 2013-12-29 LAB — TROPONIN I: Troponin I: <0.3 ng/mL (ref <0.30)

## 2013-12-29 LAB — I-STAT TROPONIN, ED: Troponin i, poc: 0.14 ng/mL (ref 0.00–0.08)

## 2013-12-29 LAB — ETHANOL

## 2013-12-29 MED ORDER — ROSUVASTATIN CALCIUM 20 MG PO TABS
20.0000 mg | ORAL_TABLET | Freq: Every evening | ORAL | Status: DC
  Administered 2013-12-31: 20 mg via ORAL
  Filled 2013-12-29 (×3): qty 1

## 2013-12-29 MED ORDER — STROKE: EARLY STAGES OF RECOVERY BOOK
Freq: Once | Status: AC
  Administered 2013-12-29: 1
  Filled 2013-12-29 (×2): qty 1

## 2013-12-29 MED ORDER — VANCOMYCIN HCL IN DEXTROSE 1-5 GM/200ML-% IV SOLN
1000.0000 mg | INTRAVENOUS | Status: DC
  Administered 2013-12-31: 1000 mg via INTRAVENOUS
  Filled 2013-12-29: qty 200

## 2013-12-29 MED ORDER — ASPIRIN EC 81 MG PO TBEC
81.0000 mg | DELAYED_RELEASE_TABLET | Freq: Every day | ORAL | Status: DC
  Administered 2013-12-30 – 2014-01-01 (×3): 81 mg via ORAL
  Filled 2013-12-29 (×3): qty 1

## 2013-12-29 MED ORDER — HYDROCODONE-ACETAMINOPHEN 5-325 MG PO TABS
1.0000 | ORAL_TABLET | Freq: Once | ORAL | Status: AC
  Administered 2013-12-29: 1 via ORAL
  Filled 2013-12-29: qty 1

## 2013-12-29 MED ORDER — OXYCODONE HCL 5 MG PO TABS
5.0000 mg | ORAL_TABLET | ORAL | Status: DC | PRN
  Administered 2013-12-30 – 2014-01-01 (×7): 5 mg via ORAL
  Filled 2013-12-29 (×7): qty 1

## 2013-12-29 MED ORDER — FLUOCINONIDE 0.05 % EX CREA
1.0000 "application " | TOPICAL_CREAM | Freq: Two times a day (BID) | CUTANEOUS | Status: DC
  Administered 2013-12-31 – 2014-01-01 (×2): 1 via TOPICAL
  Filled 2013-12-29 (×2): qty 30

## 2013-12-29 MED ORDER — HEPARIN SODIUM (PORCINE) 5000 UNIT/ML IJ SOLN
5000.0000 [IU] | Freq: Three times a day (TID) | INTRAMUSCULAR | Status: DC
  Administered 2013-12-30 – 2014-01-01 (×7): 5000 [IU] via SUBCUTANEOUS
  Filled 2013-12-29 (×6): qty 1

## 2013-12-29 MED ORDER — TICAGRELOR 90 MG PO TABS: 90.0000 mg | ORAL_TABLET | Freq: Every day | ORAL | Status: DC

## 2013-12-29 MED ORDER — CLONIDINE HCL 0.1 MG PO TABS
0.2000 mg | ORAL_TABLET | Freq: Two times a day (BID) | ORAL | Status: DC
  Administered 2013-12-30 (×2): 0.2 mg via ORAL
  Filled 2013-12-29 (×2): qty 2

## 2013-12-29 MED ORDER — PIPERACILLIN-TAZOBACTAM IN DEX 2-0.25 GM/50ML IV SOLN
2.2500 g | Freq: Three times a day (TID) | INTRAVENOUS | Status: DC
  Administered 2013-12-30 – 2014-01-01 (×7): 2.25 g via INTRAVENOUS
  Filled 2013-12-29 (×10): qty 50

## 2013-12-29 MED ORDER — VANCOMYCIN HCL IN DEXTROSE 1-5 GM/200ML-% IV SOLN: 1000.0000 mg | Freq: Once | INTRAVENOUS | Status: DC

## 2013-12-29 MED ORDER — CLOPIDOGREL BISULFATE 75 MG PO TABS
75.0000 mg | ORAL_TABLET | Freq: Every day | ORAL | Status: DC
  Administered 2013-12-30 – 2014-01-01 (×4): 75 mg via ORAL
  Filled 2013-12-29 (×4): qty 1

## 2013-12-29 MED ORDER — SODIUM CHLORIDE 0.9 % IV SOLN: INTRAVENOUS | Status: DC

## 2013-12-29 MED ORDER — FEBUXOSTAT 40 MG PO TABS
40.0000 mg | ORAL_TABLET | Freq: Two times a day (BID) | ORAL | Status: DC
  Administered 2013-12-30 – 2014-01-01 (×6): 40 mg via ORAL
  Filled 2013-12-29 (×7): qty 1

## 2013-12-29 MED ORDER — POLYSACCHARIDE IRON COMPLEX 150 MG PO CAPS
150.0000 mg | ORAL_CAPSULE | Freq: Every day | ORAL | Status: DC
  Administered 2013-12-30 – 2014-01-01 (×3): 150 mg via ORAL
  Filled 2013-12-29 (×3): qty 1

## 2013-12-29 MED ORDER — PIPERACILLIN-TAZOBACTAM 3.375 G IVPB 30 MIN
3.3750 g | Freq: Once | INTRAVENOUS | Status: DC
  Filled 2013-12-29 (×2): qty 50

## 2013-12-29 MED ORDER — VANCOMYCIN HCL 10 G IV SOLR
2000.0000 mg | Freq: Once | INTRAVENOUS | Status: AC
  Administered 2013-12-30: 2000 mg via INTRAVENOUS
  Filled 2013-12-29 (×2): qty 2000

## 2013-12-29 MED ORDER — RANOLAZINE ER 500 MG PO TB12
500.0000 mg | ORAL_TABLET | Freq: Every morning | ORAL | Status: DC
  Administered 2013-12-30 – 2014-01-01 (×3): 500 mg via ORAL
  Filled 2013-12-29 (×3): qty 1

## 2013-12-29 MED ORDER — ZOLPIDEM TARTRATE 5 MG PO TABS
10.0000 mg | ORAL_TABLET | Freq: Every evening | ORAL | Status: DC | PRN
  Administered 2013-12-31: 10 mg via ORAL
  Filled 2013-12-29: qty 2

## 2013-12-29 NOTE — Consult Note (Signed)
Referring Physician: Dr. Silverio LayYao    Chief Complaint: Left sided facial droop and left sided weakness  HPI: [name] is an 53 y.o. man pmh most significant for ESRD on HD, previous CVA, CAD s/p CABG, HTN, and HLD p/w exacerbating left sided weakness while at HD. Pt states he was last himself at 0630 where he then went to HD. During HD he started noticing some numbness and then frank weakness of his left side and the nurse noticed some weakness. It appears that he was having some periods of hypotension during his HD session the pt believes he went only as low as SBP 110 and he feels this was about the same time his symptoms became worse which was at about 1100. He completed HD and didn't have any resolution of his symptoms and was then transferred by EMS. He denied having any CP, SOB, HA, blurry vision, dysphagia, or dysarthria. The patient has had significant hx of CVA and states he feels these changes are different. The patient is currently taking ASA and plavix which he reports compliance. It is uncler whether the patient is still taking Brilinta after CABG back 04/01/11.  Date last known well: 12/29/13 Time last known well: 0630 tPA Given: No: given that pt has documented concern for previous CVA w/in the last month (12/17/13) and LKW is outside tpa window  Past Medical History  Diagnosis Date  . ACHALASIA   . CEREBROVASCULAR ACCIDENT, HX OF   . Headache(784.0)   . HYPERLIPIDEMIA     takes crestor daily  . RENAL CALCULUS, HX OF   . RENAL INSUFFICIENCY   . H/O hiatal hernia   . CAD (coronary artery disease), 3 vessel significant disease.    . Impaired glucose tolerance   . S/P PTCA (percutaneous transluminal coronary angioplasty), 06/30/11 07/01/2011  . CKD (chronic kidney disease) stage 4, GFR 15-29 ml/min   . History of diastolic dysfunction, grade 2 by echo   . S/P coronary artery stent placement, PTCA/DES Resolute to mid-LAD via the LIMA graft, and PTCA of apical 95% stenosis 07/05/11  07/04/2011  . DEPRESSION   . Sleep apnea     [place] , NO MACHINE YET  1 YEAR  . Seizures     INSULIN INDUCED  . ANEMIA-NOS   . NSTEMI (non-ST elevated myocardial infarction), 06/30/11 07/01/2011  . Stroke     93/2005/2006/2007;left sided weakness  . DEGENERATIVE JOINT DISEASE, SHOULDER     knee  . Gout     takes Uloric daily  . GERD     no meds required  . History of colon polyps   . HYPERTENSION     takes Norvasc,Catapress,Hydralazine,Imdur,and Metoprolol daily  . Insomnia     takes Ambien nightly  . Hyperlipidemia     Past Surgical History  Procedure Laterality Date  . Tonsillectomy    . Stomach surgery  2009    achalasia  . Knee surgery  01/07/2011    right  . Insertion of dialysis catheter  04/01/2011    Procedure: INSERTION OF DIALYSIS CATHETER;  Surgeon: Nilda SimmerBrian Liang-Yu Chen, MD;  [facility];  Service: Vascular;  Laterality: N/A;  . Cardiac catheterization    . Av fistula placement  12/02/2011    Procedure: ARTERIOVENOUS (AV) FISTULA CREATION;  Surgeon: Fransisco HertzBrian L Chen, MD;  [facility];  Service: Vascular;  Laterality: Left;  Ultrasound Guided  . Subclavian stent placement  12/20/2011  . Coronary artery bypass graft  04/01/2011  Procedure: CORONARY ARTERY BYPASS GRAFTING (CABG);  Surgeon: Kathlee Nations Suann Larry, MD;  Location: [facility] OR;  Service: Open Heart Surgery;  Laterality: N/A;  . Coronary angioplasty      06/30/11   AT [facility]  . Colonoscopy    . Hyperlipidemia    . Ligation of arteriovenous  fistula Left 06/08/2012    Procedure: LIGATION OF ARTERIOVENOUS  FISTULA;  Surgeon: Fransisco Hertz, MD;  Location[facility] OR;  Service: Vascular;  Laterality: Left;  . Av fistula placement Left 06/08/2012    Procedure: ARTERIOVENOUS (AV) FISTULA CREATION;  Surgeon: Fransisco Hertz, MD;  Location: [facility] OR;  Service: Vascular;  Laterality: Left;    Family History  Problem Relation Age of Onset  . Hypertension Other   . Stroke Other   . Hyperlipidemia Other   . Cancer  Mother   . Heart disease Mother   . Hyperlipidemia Mother   . Hypertension Mother   . Cancer Father   . Heart disease Father    Social History:  reports that he has never smoked. He has never used smokeless tobacco. He reports that he does not drink alcohol or use illicit drugs.  Allergies:  Allergies  Allergen Reactions  . Shrimp [Shellfish Allergy] Shortness Of Breath  . Atorvastatin Other (See Comments)    REACTION: weakness  . Insulins Other (See Comments)    Patient unsure as to the kind of insulin but states that it has previously caused seizures. This occurred in the hospital in 2006; but in most recent hospitalization (Jan 2013) received insulin but did not have reaction  . Simvastatin Other (See Comments)    REACTION: abnormal liver tests  . Ultram [Tramadol]     Medications:  No current facility-administered medications on file prior to encounter.   Current Outpatient Prescriptions on File Prior to Encounter  Medication Sig Dispense Refill  . acetaminophen (TYLENOL) 325 MG tablet Take 650 mg by mouth every 6 (six) hours as needed for pain. For pain      . aspirin EC 81 MG EC tablet Take 1 tablet (81 mg total) by mouth daily.      . cloNIDine (CATAPRES) 0.2 MG tablet Take 0.2 mg by mouth 2 (two) times daily.       . clopidogrel (PLAVIX) 75 MG tablet TAKE 1 TABLET (75 MG TOTAL) BY MOUTH DAILY.  30 tablet  11  . febuxostat (ULORIC) 40 MG tablet Take 40 mg by mouth 2 (two) times daily.      . fluocinonide cream (LIDEX) 0.05 % Apply 1 application topically 2 (two) times daily.      . hydrALAZINE (APRESOLINE) 10 MG tablet TAKE 1 TABLET BY MOUTH 2 TIMES A DAY  270 tablet  6  . isosorbide mononitrate (IMDUR) 30 MG 24 hr tablet Take 1 tablet (30 mg total) by mouth daily.  30 tablet  6  . metoprolol (LOPRESSOR) 50 MG tablet TAKE 1 TABLET BY MOUTH TWICE A DAY  180 tablet  3  . NITROSTAT 0.4 MG SL tablet PLACE 1 TABLET UNDER TONGUE AS NEEDED FOR CHEST PAIN EVERY 5 MINUTES (MAX  3DOSES)  25 tablet  4  . Omega-3 Fatty Acids (FISH OIL) 1200 MG CAPS Take 1 capsule by mouth 3 (three) times daily.      Marland Kitchen oxyCODONE (ROXICODONE) 5 MG immediate release tablet Take 1 tablet (5 mg total) by mouth every 4 (four) hours as needed for pain.  30 tablet  0  . polysaccharide iron (NIFEREX)  150 MG CAPS capsule Take 150 mg by mouth daily.      . ranolazine (RANEXA) 500 MG 12 hr tablet Take 1 tablet (500 mg total) by mouth every morning.  90 tablet  1  . rosuvastatin (CRESTOR) 20 MG tablet Take 1 tablet (20 mg total) by mouth every evening.  28 tablet  0  . terazosin (HYTRIN) 10 MG capsule TAKE 1 CAPSULE BY MOUTH AT BEDTIME.  90 capsule  3  . Ticagrelor (BRILINTA) 90 MG TABS tablet Take 1 tablet (90 mg total) by mouth 2 (two) times daily.  180 tablet  1  . triamcinolone cream (KENALOG) 0.1 % Apply 1 application topically as needed.       . zolpidem (AMBIEN) 10 MG tablet Take 1 tablet (10 mg total) by mouth at bedtime as needed for sleep.  30 tablet  5   ROS: complete 14 pt ROS completed with pertinent listed in HPI  Physical Examination: Blood pressure 135/63, pulse 105, temperature 99.6 F (37.6 C), temperature source Oral, resp. rate 28, height 6' (1.829 m), weight 219 lb (99.338 kg), SpO2 98.00%. General: resting in bed, NAD Mental Status: Alert, oriented, thought content appropriate.  Speech fluent without evidence of aphasia.  Able to follow 3 step commands without difficulty. Cranial Nerves: II: Discs flat bilaterally; Visual fields grossly normal, pupils equal, round, reactive to light and accommodation III,IV, VI: ptosis not present, extra-ocular motions intact bilaterally V,VII: smile asymmetric with left sided nasolabial flattening, facial light touch sensation normal bilaterally VIII: hearing normal bilaterally IX,X: gag reflex present XI: bilateral shoulder shrug XII: midline tongue extension without atrophy or fasciculations  Motor: Right : Upper extremity    5/5    Left:     Upper extremity   4+/5  Lower extremity   5/5     Lower extremity   4/5 Tone and bulk:normal tone throughout; no atrophy noted Sensory: Pinprick and light touch intact throughout, bilaterally Deep Tendon Reflexes:  Right: Upper Extremity   Left: Upper extremity   biceps (C-5 to C-6) 2/4   biceps (C-5 to C-6) 2/4 tricep (C7) 2/4    triceps (C7) 2/4 Brachioradialis (C6) 2/4  Brachioradialis (C6) 2/4  Lower Extremity Lower Extremity  quadriceps (L-2 to L-4) 2/4   quadriceps (L-2 to L-4) 1/4 Achilles (S1) 2/4   Achilles (S1) 2/4  Plantars: Right: downgoing   Left: downgoing Cerebellar: normal finger-to-nose,  normal heel-to-shin test CV: pulses palpable throughout   Laboratory Studies:  Basic Metabolic Panel:  Recent Labs Lab 12/29/13 1333  NA 135*  K 4.4  CL 93*  GLUCOSE 172*  BUN 28*  CREATININE 7.10*   CBC:  Recent Labs Lab 12/29/13 1333  HGB 12.6*  HCT 37.0*   Microbiology: Results for orders placed during the hospital encounter of 06/08/12  SURGICAL PCR SCREEN     Status: Abnormal   Collection Time    06/08/12  6:49 AM      Result Value Ref Range Status   MRSA, PCR POSITIVE (*) NEGATIVE Final   Staphylococcus aureus POSITIVE (*) NEGATIVE Final   Comment:            The Xpert SA Assay (FDA     approved for NASAL specimens     in patients over 79 years of age),     is one component of     a comprehensive surveillance     program.  Test performance has     been validated by [employer]  [facility] for patients greater     than or equal to 67 year old.     It is not intended     to diagnose infection nor to     guide or monitor treatment.   Lipid Panel:    Component Value Date/Time   CHOL 123 11/09/2013 1715   TRIG 121.0 11/09/2013 1715   HDL 32.50* 11/09/2013 1715   CHOLHDL 4 11/09/2013 1715   VLDL 24.2 11/09/2013 1715   LDLCALC 66 11/09/2013 1715   HgbA1C:  Lab Results  Component Value Date   HGBA1C 5.4 11/09/2013   Other results: EKG:  unchanged from previous tracings, nonspecific ST and T waves changes, sinus tachycardia, Q waves in II, III, AVF.  Imaging: Ct Head Wo Contrast  12/29/2013   CLINICAL DATA:  Code stroke, left facial droop  EXAM: CT HEAD WITHOUT CONTRAST  TECHNIQUE: Contiguous axial images were obtained from the base of the skull through the vertex without intravenous contrast.  COMPARISON:  MRI brain 07/06/2012  FINDINGS: There is no evidence of mass effect, midline shift, or extra-axial fluid collections. There is no evidence of a space-occupying lesion or intracranial hemorrhage. There is no evidence of a cortical-based area of acute infarction. There is an old left basal ganglia lacunar infarct. There is an old cortical infarct of the right parietal lobe. There is generalized cerebral atrophy. There is periventricular white matter low attenuation likely secondary to microangiopathy.  The ventricles and sulci are appropriate for the patient's age. The basal cisterns are patent.  Visualized portions of the orbits are unremarkable. The visualized portions of the paranasal sinuses and mastoid air cells are unremarkable. Cerebrovascular atherosclerotic calcifications are noted.  The osseous structures are unremarkable.  IMPRESSION: No acute intracranial pathology.  These results were called by telephone at the time of interpretation on 12/29/2013 at 1:56 pm to Dr. Amada Jupiter, who verbally acknowledged these results.   Electronically Signed   By: Elige Ko   On: 12/29/2013 13:56    Assessment: 53 y.o. man pmh significant for HTN, CAD s/p CABG, and recent/multiple CVA p/w worsening left sided weakness concern for new stroke.   Stroke Risk Factors - family history, hyperlipidemia and hypertension  Christen Bame, MD  IM PGY-3 Pgr: [phone] 12/29/2013, 2:02 PM   I have seen and evaluated the patient. I have reviewed the above note and agree with the above.   He has had left sided weakness since previous stroke. He is  being seen for stroke by his neurologist Dr. Terrace Arabia who documents that she supects he had recurrent stroke on 10/03. He is therefore not a candidate for tpa.   I am not certain if this represents new stroke or unmasking orf previous deficits from physiological stress. I would favor an MRI to assess for new areas of infarct.   Ritta Slot, MD [facility] [phone]  If 7pm- 7am, please page neurology on call as listed in AMION.

## 2013-12-29 NOTE — ED Notes (Signed)
Pt to xray

## 2013-12-29 NOTE — Progress Notes (Signed)
Patient was evaluated earlier for left facial droop and left sided weakness. At that time it was unclear if his symptoms represented a new infarct or unmasking of prior deficits. He was sent for a MRI brain. Notified by ED that MRI positive for acute infarct head of the caudate on the left. Likely small vessel disease. Location does not explain his current symptoms. Will admit for stroke workup.   1. HgbA1c, fasting lipid panel 2. Frequent neuro checks 3. Echocardiogram 4. Carotid dopplers 5. Prophylactic therapy-Antiplatelet med: continue ASA 81mg  and Plavix 75mg  6. Risk factor modification 7. Telemetry monitoring 8. PT consult, OT consult, Speech consult

## 2013-12-29 NOTE — ED Provider Notes (Signed)
CSN: [id number]     Arrival date & time 12/29/13  1319 History   First MD Initiated Contact with Patient 12/29/13 1328     Chief Complaint  Patient presents with  . Code Stroke    An emergency department physician performed an initial assessment on this suspected stroke patient at 1320. (Consider location/radiation/quality/duration/timing/severity/associated sxs/prior Treatment) The history is provided by the patient and medical records.   This is a 53 year old male with past medical history significant for hypertension, hyperlipidemia, end-stage renal disease on hemodialysis, congestive heart failure, coronary artery disease, prior CVA x4 ('93, '05, '06, '07), presenting to the ED as a code stroke. Patient began dialysis earlier this morning around 0600. States around 1100 he began noticing some left-sided facial droop and left leg weakness when standing up after treatment.  He was observed to have some ataxia by dialysis staff when attempting to walk..  Patient does have residual deficits from prior stroke, states symptoms seem worse than normal today. Patient was allowed to finish his full dialysis treatment before being brought to the emergency department. He states his blood pressure did drop briefly during treatment, systolic was noted to be 110 which is "low for him".  He denies any current chest pain, shortness of breath, palpitations, dizziness, lightheadedness, or feelings of syncope.  Patient is on daily aspirin and Plavix for stroke prevention.  Denies recent illness, fever, chills, cough, congestion.  Patient does make a small amount of urine, states has appeared darker in color than normal recently.  Of note, patient was seen by OP neurologist last week for similar sx-- some concern for acute stroke at that time.  Past Medical History  Diagnosis Date  . ACHALASIA   . CEREBROVASCULAR ACCIDENT, HX OF   . Headache(784.0)   . HYPERLIPIDEMIA     takes crestor daily  . RENAL CALCULUS,  HX OF   . RENAL INSUFFICIENCY   . H/O hiatal hernia   . CAD (coronary artery disease), 3 vessel significant disease.    . Impaired glucose tolerance   . S/P PTCA (percutaneous transluminal coronary angioplasty), 06/30/11 07/01/2011  . CKD (chronic kidney disease) stage 4, GFR 15-29 ml/min   . History of diastolic dysfunction, grade 2 by echo   . S/P coronary artery stent placement, PTCA/DES Resolute to mid-LAD via the LIMA graft, and PTCA of apical 95% stenosis 07/05/11 07/04/2011  . DEPRESSION   . Sleep apnea     SLEEP STUDY IN [place] , NO MACHINE YET  1 YEAR  . Seizures     INSULIN INDUCED  . ANEMIA-NOS   . NSTEMI (non-ST elevated myocardial infarction), 06/30/11 07/01/2011  . Stroke     93/2005/2006/2007;left sided weakness  . DEGENERATIVE JOINT DISEASE, SHOULDER     knee  . Gout     takes Uloric daily  . GERD     no meds required  . History of colon polyps   . HYPERTENSION     takes Norvasc,Catapress,Hydralazine,Imdur,and Metoprolol daily  . Insomnia     takes Ambien nightly  . Hyperlipidemia    Past Surgical History  Procedure Laterality Date  . Tonsillectomy    . Stomach surgery  2009    achalasia  . Knee surgery  01/07/2011    right  . Insertion of dialysis catheter  04/01/2011    Procedure: INSERTION OF DIALYSIS CATHETER;  Surgeon: Nilda Simmer, MD;  Location: [facility] OR;  Service: Vascular;  Laterality: N/A;  . Cardiac catheterization    . Av  fistula placement  12/02/2011    Procedure: ARTERIOVENOUS (AV) FISTULA CREATION;  Surgeon: Fransisco HertzBrian L Chen, MD;  [facility];  Service: Vascular;  Laterality: Left;  Ultrasound Guided  . Subclavian stent placement  12/20/2011  . Coronary artery bypass graft  04/01/2011    Procedure: CORONARY ARTERY BYPASS GRAFTING (CABG);  Surgeon: Kathlee NationsPeter Van Suann Larryrigt III, MD;  [facility];  Service: Open Heart Surgery;  Laterality: N/A;  . Coronary [facility]      [facility]  . Colonoscopy    . Hyperlipidemia    . Ligation of  arteriovenous  fistula Left 06/08/2012    Procedure: LIGATION OF ARTERIOVENOUS  FISTULA;  Surgeon: Fransisco HertzBrian L Chen, MD;  [facility];  Service: Vascular;  Laterality: Left;  . Av fistula placement Left 06/08/2012    Procedure: ARTERIOVENOUS (AV) FISTULA CREATION;  Surgeon: Fransisco HertzBrian L Chen, MD;  [facility];  Service: Vascular;  Laterality: Left;   Family History  Problem Relation Age of Onset  . Hypertension Other   . Stroke Other   . Hyperlipidemia Other   . Cancer Mother   . Heart disease Mother   . Hyperlipidemia Mother   . Hypertension Mother   . Cancer Father   . Heart disease Father    History  Substance Use Topics  . Smoking status: Never Smoker   . Smokeless tobacco: Never Used  . Alcohol Use: No     Comment: rarely    Review of Systems  Neurological: Positive for facial asymmetry and weakness.  All other systems reviewed and are negative.     Allergies  Shrimp; Atorvastatin; Insulins; Simvastatin; and Ultram  Home Medications   Prior to Admission medications   Medication Sig Start Date End Date Taking? Authorizing Provider  acetaminophen (TYLENOL) 325 MG tablet Take 650 mg by mouth every 6 (six) hours as needed for pain. For pain    Historical Provider, MD  aspirin EC 81 MG EC tablet Take 1 tablet (81 mg total) by mouth daily. 07/07/11   Dwana MelenaBryan W Hager, PA-C  cloNIDine (CATAPRES) 0.2 MG tablet Take 0.2 mg by mouth 2 (two) times daily.  05/13/11   Corwin LevinsJames W John, MD  clopidogrel (PLAVIX) 75 MG tablet TAKE 1 TABLET (75 MG TOTAL) BY MOUTH DAILY. 08/26/13   Corwin LevinsJames W John, MD  febuxostat (ULORIC) 40 MG tablet Take 40 mg by mouth 2 (two) times daily. 05/13/11   Corwin LevinsJames W John, MD  fluocinonide cream (LIDEX) 0.05 % Apply 1 application topically 2 (two) times daily. 02/04/12   Corwin LevinsJames W John, MD  hydrALAZINE (APRESOLINE) 10 MG tablet TAKE 1 TABLET BY MOUTH 2 TIMES A DAY 08/26/13   Runell GessJonathan J Berry, MD  isosorbide mononitrate (IMDUR) 30 MG 24 hr tablet Take 1 tablet (30 mg total) by  mouth daily. 06/24/13   Leone BrandLaura R Ingold, NP  metoprolol (LOPRESSOR) 50 MG tablet TAKE 1 TABLET BY MOUTH TWICE A DAY 01/07/13   Corwin LevinsJames W John, MD  NITROSTAT 0.4 MG SL tablet PLACE 1 TABLET UNDER TONGUE AS NEEDED FOR CHEST PAIN EVERY 5 MINUTES (MAX 3DOSES)    Runell GessJonathan J Berry, MD  Omega-3 Fatty Acids (FISH OIL) 1200 MG CAPS Take 1 capsule by mouth 3 (three) times daily.    Historical Provider, MD  oxyCODONE (ROXICODONE) 5 MG immediate release tablet Take 1 tablet (5 mg total) by mouth every 4 (four) hours as needed for pain. 06/08/12   Lars MageEmma M Collins, PA-C  polysaccharide iron (NIFEREX)  150 MG CAPS capsule Take 150 mg by mouth daily. 05/28/11   Corwin Levins, MD  ranolazine (RANEXA) 500 MG 12 hr tablet Take 1 tablet (500 mg total) by mouth every morning. 07/19/13 07/19/14  Runell Gess, MD  rosuvastatin (CRESTOR) 20 MG tablet Take 1 tablet (20 mg total) by mouth every evening. 12/22/12   Runell Gess, MD  terazosin (HYTRIN) 10 MG capsule TAKE 1 CAPSULE BY MOUTH AT BEDTIME. 06/24/13   Corwin Levins, MD  Ticagrelor (BRILINTA) 90 MG TABS tablet Take 1 tablet (90 mg total) by mouth 2 (two) times daily. 06/28/13   Runell Gess, MD  triamcinolone cream (KENALOG) 0.1 % Apply 1 application topically as needed.  03/30/12   Historical Provider, MD  zolpidem (AMBIEN) 10 MG tablet Take 1 tablet (10 mg total) by mouth at bedtime as needed for sleep. 08/12/13   Corwin Levins, MD   BP 135/63  Pulse 105  Temp(Src) 99.6 F (37.6 C) (Oral)  Resp 28  Ht 6' (1.829 m)  Wt 219 lb (99.338 kg)  BMI 29.70 kg/m2  SpO2 98%  Physical Exam  Nursing note and vitals reviewed. Constitutional: He is oriented to person, place, and time. He appears well-developed and well-nourished.  HENT:  Head: Normocephalic and atraumatic.  Mouth/Throat: Oropharynx is clear and moist.  Eyes: Conjunctivae and EOM are normal. Pupils are equal, round, and reactive to light.  EOM intact, normal confrontation, no visual field cuts  Neck: Normal  range of motion.  Cardiovascular: Normal rate, regular rhythm and normal heart sounds.   Pulmonary/Chest: Effort normal and breath sounds normal.  Abdominal: Soft. Bowel sounds are normal. There is no tenderness. There is no guarding.  Musculoskeletal: Normal range of motion. He exhibits no edema.  Dialysis fistula left upper extremity with bandage in place, thrill present  Neurological: He is alert and oriented to person, place, and time.  AAOx3, answering questions appropriately; equal strength UE and LE bilaterally; CN grossly intact; moves all extremities appropriately without ataxia; slight blunting of left nasolabial fold; speech clear; gait not tested  Skin: Skin is warm and dry.  Psychiatric: He has a normal mood and affect.    ED Course  Procedures (including critical care time) Labs Review Labs Reviewed  PROTIME-INR - Abnormal; Notable for the following:    Prothrombin Time 16.0 (*)    All other components within normal limits  CBC - Abnormal; Notable for the following:    WBC 19.7 (*)    RBC 3.52 (*)    Hemoglobin 11.3 (*)    HCT 33.1 (*)    All other components within normal limits  DIFFERENTIAL - Abnormal; Notable for the following:    Neutrophils Relative % 85 (*)    Lymphocytes Relative 6 (*)    Neutro Abs 16.7 (*)    Monocytes Absolute 1.8 (*)    All other components within normal limits  COMPREHENSIVE METABOLIC PANEL - Abnormal; Notable for the following:    Sodium 136 (*)    Chloride 90 (*)    Glucose, Bld 166 (*)    Creatinine, Ser 6.72 (*)    GFR calc non Af Amer 8 (*)    GFR calc Af Amer 10 (*)    Anion gap 20 (*)    All other components within normal limits  I-STAT CHEM 8, ED - Abnormal; Notable for the following:    Sodium 135 (*)    Chloride 93 (*)    BUN 28 (*)  Creatinine, Ser 7.10 (*)    Glucose, Bld 172 (*)    Calcium, Ion 1.03 (*)    Hemoglobin 12.6 (*)    HCT 37.0 (*)    All other components within normal limits  I-STAT TROPOININ, ED -  Abnormal; Notable for the following:    Troponin i, poc 0.14 (*)    All other components within normal limits  ETHANOL  APTT  TROPONIN I  URINE RAPID DRUG SCREEN (HOSP PERFORMED)  URINALYSIS, ROUTINE W REFLEX MICROSCOPIC  I-STAT TROPOININ, ED    Imaging Review Ct Head Wo Contrast  12/29/2013   CLINICAL DATA:  Code stroke, left facial droop  EXAM: CT HEAD WITHOUT CONTRAST  TECHNIQUE: Contiguous axial images were obtained from the base of the skull through the vertex without intravenous contrast.  COMPARISON:  MRI brain 07/06/2012  FINDINGS: There is no evidence of mass effect, midline shift, or extra-axial fluid collections. There is no evidence of a space-occupying lesion or intracranial hemorrhage. There is no evidence of a cortical-based area of acute infarction. There is an old left basal ganglia lacunar infarct. There is an old cortical infarct of the right parietal lobe. There is generalized cerebral atrophy. There is periventricular white matter low attenuation likely secondary to microangiopathy.  The ventricles and sulci are appropriate for the patient's age. The basal cisterns are patent.  Visualized portions of the orbits are unremarkable. The visualized portions of the paranasal sinuses and mastoid air cells are unremarkable. Cerebrovascular atherosclerotic calcifications are noted.  The osseous structures are unremarkable.  IMPRESSION: No acute intracranial pathology.  These results were called by telephone at the time of interpretation on 12/29/2013 at 1:56 pm to Dr. Amada Jupiter, who verbally acknowledged these results.   Electronically Signed   By: Elige Ko   On: 12/29/2013 13:56     EKG Interpretation   Date/Time:  Thursday December 29 2013 13:37:03 EDT Ventricular Rate:  103 PR Interval:  146 QRS Duration: 72 QT Interval:  400 QTC Calculation: 524 R Axis:   -22 Text Interpretation:  Sinus tachycardia Probable left atrial enlargement  Borderline left axis deviation  Abnormal R-wave progression, late  transition Abnormal inferior Q waves Abnormal T, consider ischemia,  lateral leads Minimal ST elevation, inferior leads Prolonged QT interval  No significant change since last tracing Confirmed by YAO  MD, DAVID  ([id number]) on 12/29/2013 1:42:40 PM      MDM   Final diagnoses:  Weakness of left lower extremity  Facial droop   53 year old male arrived from dialysis center with complaint of left leg weakness and left facial droop.  He was reported to have some of the symptoms last week when he was seen by his neurologist with concern for an acute stroke at that time. Today, patient does have mild blunting of his left nasolabial fold.  CT head negative for acute findings.  Lab work with leukocytosis of 19.7K.  Patient currently afebrile without cough, does not urine slightly darker than normal.  CXR and u/a pending to evaluate for infectious etiology.  Remainder of lab work appears baseline for patient when compared with previous.  Patient has been evaluated by neurology-- recommends MRI for now.  Care signed out to PA Lawyer at shift change.  Will follow results of U/A, CXR, and MRI and discuss with neurology to determine ultimate disposition.  Garlon Hatchet, PA-C 12/29/13 [phone]

## 2013-12-29 NOTE — ED Notes (Signed)
Elevated troponin results given to yao.

## 2013-12-29 NOTE — ED Notes (Signed)
Notified [facility] 155

## 2013-12-29 NOTE — Code Documentation (Signed)
53[facility] [id number].  Patient started dialysis treatment today at 0615.  Patient went to ambulate post dialysis treatment and had difficulty.  EMS called and assessed left sided weakness and facial droop. Code Stroke activated.  Patient has a h/o stroke, HTN, and ESRD.  Patient taken to CT on arrival.  Stroke team at bedside.  Patient back to room.  NIHSS 3 on arrival, see documentation for details and code stroke times.  Patient has residual left sided weakness at baseline s/p previous stroke, but reports today is worse.  He also reports weakness that began on 12/17/2013 with an outpatient note by neurology capturing this.  Patient takes ASA and Plavix.  Patient is outside the window for treatment with tPA.  Bedside handoff with ED RN Duwayne Heckanielle.

## 2013-12-29 NOTE — ED Notes (Signed)
Dr. Silverio Lay at bedside to attempt US guided IV.

## 2013-12-29 NOTE — H&P (Addendum)
[facility] History and Physical  [name] WUJ:[id number] DOB: 17-Mar-1961 DOA: 12/29/2013  Referring physician: EDP PCP: Oliver Barre, MD   Chief Complaint: Left sided weakness   HPI: [name] is a 53 y.o. male h/o ESRD dialysis TTS, prior CVA, presents to ED with Left facial droop and left sided weakness.  Symptoms onset earlier today during HD this morning.  It seems he was having some periods of hypotension during dialysis this morning though he believes his SBP only got as low as 110s.  He completed HD and symptoms persisted so he presented to the ED via EMS.  Patient currently taking ASA and plavix, he quit taking brilinta last month "because it gave him a headache".  Review of Systems: Positive for N/V yesterday but none today, negative for fevers (subjectively, objectively has fever of 101.2 rectally in ED), chills, night sweats, pain or swelling at fistula site (did have a fistulagram recently for blood clots at fistula site) Systems reviewed.  As above, otherwise negative  Past Medical History  Diagnosis Date  . ACHALASIA   . CEREBROVASCULAR ACCIDENT, HX OF   . Headache(784.0)   . HYPERLIPIDEMIA     takes crestor daily  . RENAL CALCULUS, HX OF   . RENAL INSUFFICIENCY   . H/O hiatal hernia   . CAD (coronary artery disease), 3 vessel significant disease.    . Impaired glucose tolerance   . S/P PTCA (percutaneous transluminal coronary angioplasty), 06/30/11 07/01/2011  . CKD (chronic kidney disease) stage 4, GFR 15-29 ml/min   . History of diastolic dysfunction, grade 2 by echo   . S/P coronary artery stent placement, PTCA/DES Resolute to mid-LAD via the LIMA graft, and PTCA of apical 95% stenosis 07/05/11 07/04/2011  . DEPRESSION   . Sleep apnea     SLEEP STUDY IN [place] , NO MACHINE YET  1 YEAR  . Seizures     INSULIN INDUCED  . ANEMIA-NOS   . NSTEMI (non-ST elevated myocardial infarction), 06/30/11 07/01/2011  . Stroke     93/2005/2006/2007;left sided  weakness  . DEGENERATIVE JOINT DISEASE, SHOULDER     knee  . Gout     takes Uloric daily  . GERD     no meds required  . History of colon polyps   . HYPERTENSION     takes Norvasc,Catapress,Hydralazine,Imdur,and Metoprolol daily  . Insomnia     takes Ambien nightly  . Hyperlipidemia    Past Surgical History  Procedure Laterality Date  . Tonsillectomy    . Stomach surgery  2009    achalasia  . Knee surgery  01/07/2011    right  . Insertion of dialysis catheter  04/01/2011    Procedure: INSERTION OF DIALYSIS CATHETER;  Surgeon: Nilda Simmer, MD;  Location: [facility] OR;  Service: Vascular;  Laterality: N/A;  . Cardiac catheterization    . Av fistula placement  12/02/2011    Procedure: ARTERIOVENOUS (AV) FISTULA CREATION;  Surgeon: Fransisco Hertz, MD;  Location: [facility] OR;  Service: Vascular;  Laterality: Left;  Ultrasound Guided  . Subclavian stent placement  12/20/2011  . Coronary artery bypass graft  04/01/2011    Procedure: CORONARY ARTERY BYPASS GRAFTING (CABG);  Surgeon: Kathlee Nations Suann Larry, MD;  Location: [facility] OR;  Service: Open Heart Surgery;  Laterality: N/A;  . Coronary angioplasty      06/30/11   AT [facility]  . Colonoscopy    . Hyperlipidemia    . Ligation of arteriovenous  fistula Left 06/08/2012  Procedure: LIGATION OF ARTERIOVENOUS  FISTULA;  Surgeon: Fransisco Hertz, MD;  Location: [facility] OR;  Service: Vascular;  Laterality: Left;  . Av fistula placement Left 06/08/2012    Procedure: ARTERIOVENOUS (AV) FISTULA CREATION;  Surgeon: Fransisco Hertz, MD;  Location: [facility] OR;  Service: Vascular;  Laterality: Left;   Social History:  reports that he has never smoked. He has never used smokeless tobacco. He reports that he does not drink alcohol or use illicit drugs.  Allergies  Allergen Reactions  . Shrimp [Shellfish Allergy] Shortness Of Breath  . Atorvastatin Other (See Comments)    REACTION: weakness  . Insulins Other (See Comments)    Patient unsure as to the kind of insulin but states that  it has previously caused seizures. This occurred in the hospital in 2006; but in most recent hospitalization (Jan 2013) received insulin but did not have reaction  . Simvastatin Other (See Comments)    REACTION: abnormal liver tests  . Ultram [Tramadol] Other (See Comments)    Liver enzyme     Family History  Problem Relation Age of Onset  . Hypertension Other   . Stroke Other   . Hyperlipidemia Other   . Cancer Mother   . Heart disease Mother   . Hyperlipidemia Mother   . Hypertension Mother   . Cancer Father   . Heart disease Father      Prior to Admission medications   Medication Sig Start Date End Date Taking? Authorizing Provider  aspirin EC 81 MG EC tablet Take 1 tablet (81 mg total) by mouth daily. 07/07/11  Yes Dwana Melena, PA-C  cloNIDine (CATAPRES) 0.2 MG tablet Take 0.2 mg by mouth 2 (two) times daily.  05/13/11  Yes Corwin Levins, MD  clopidogrel (PLAVIX) 75 MG tablet Take 75 mg by mouth daily.   Yes Historical Provider, MD  febuxostat (ULORIC) 40 MG tablet Take 40 mg by mouth 2 (two) times daily. 05/13/11  Yes Corwin Levins, MD  fluocinonide cream (LIDEX) 0.05 % Apply 1 application topically 2 (two) times daily. 02/04/12  Yes Corwin Levins, MD  hydrALAZINE (APRESOLINE) 10 MG tablet Take 10 mg by mouth 2 (two) times daily.   Yes Historical Provider, MD  isosorbide mononitrate (IMDUR) 30 MG 24 hr tablet Take 1 tablet (30 mg total) by mouth daily. 06/24/13  Yes Leone Brand, NP  metoprolol (LOPRESSOR) 50 MG tablet Take 50 mg by mouth 2 (two) times daily.   Yes Historical Provider, MD  NITROSTAT 0.4 MG SL tablet PLACE 1 TABLET UNDER TONGUE AS NEEDED FOR CHEST PAIN EVERY 5 MINUTES (MAX 3DOSES)   Yes Runell Gess, MD  oxyCODONE (ROXICODONE) 5 MG immediate release tablet Take 1 tablet (5 mg total) by mouth every 4 (four) hours as needed for pain. 06/08/12  Yes Lars Mage, PA-C  polysaccharide iron (NIFEREX) 150 MG CAPS capsule Take 150 mg by mouth daily. 05/28/11  Yes Corwin Levins, MD  ranolazine (RANEXA) 500 MG 12 hr tablet Take 1 tablet (500 mg total) by mouth every morning. 07/19/13 07/19/14 Yes Runell Gess, MD  rosuvastatin (CRESTOR) 20 MG tablet Take 1 tablet (20 mg total) by mouth every evening. 12/22/12  Yes Runell Gess, MD  terazosin (HYTRIN) 10 MG capsule Take 10 mg by mouth at bedtime.   Yes Historical Provider, MD  ticagrelor (BRILINTA) 90 MG TABS tablet Take 90 mg by mouth daily.   Yes Historical Provider, MD  zolpidem Remus Loffler)  10 MG tablet Take 1 tablet (10 mg total) by mouth at bedtime as needed for sleep. 08/12/13  Yes Corwin Levins, MD   Physical Exam: Filed Vitals:   12/29/13 2111  BP:   Pulse:   Temp: 101.2 F (38.4 C)  Resp:     BP 140/80  Pulse 109  Temp(Src) 101.2 F (38.4 C) (Rectal)  Resp 21  Ht 6' (1.829 m)  Wt 99.338 kg (219 lb)  BMI 29.70 kg/m2  SpO2 96%  General Appearance:    Alert, oriented, no distress, appears stated age  Head:    Normocephalic, atraumatic  Eyes:    PERRL, EOMI, sclera non-icteric        Nose:   Nares without drainage or epistaxis. Mucosa, turbinates normal  Throat:   Moist mucous membranes. Oropharynx without erythema or exudate.  Neck:   Supple. No carotid bruits.  No thyromegaly.  No lymphadenopathy.   Back:     No CVA tenderness, no spinal tenderness  Lungs:     Clear to auscultation bilaterally, without wheezes, rhonchi or rales  Chest wall:    No tenderness to palpitation  Heart:    Regular rate and rhythm without murmurs, gallops, rubs  Abdomen:     Soft, non-tender, nondistended, normal bowel sounds, no organomegaly  Genitalia:    deferred  Rectal:    deferred  Extremities:   No clubbing, cyanosis or edema.  Pulses:   2+ and symmetric all extremities  Skin:   Skin color, texture, turgor normal, no rashes or lesions, bruising on upper arm, fistula site non tender, non inflamed  Lymph nodes:   Cervical, supraclavicular, and axillary nodes normal  Neurologic:   LUE and LLE 4/5 strength,  left sided facial droop.    Labs on Admission:  Basic Metabolic Panel:  Recent Labs Lab 12/29/13 1324 12/29/13 1333  NA 136* 135*  K 4.5 4.4  CL 90* 93*  CO2 26  --   GLUCOSE 166* 172*  BUN 23 28*  CREATININE 6.72* 7.10*  CALCIUM 9.2  --    Liver Function Tests:  Recent Labs Lab 12/29/13 1324  AST 32  ALT 11  ALKPHOS 49  BILITOT 0.9  PROT 8.0  ALBUMIN 3.8   No results found for this basename: LIPASE, AMYLASE,  in the last 168 hours No results found for this basename: AMMONIA,  in the last 168 hours CBC:  Recent Labs Lab 12/29/13 1324 12/29/13 1333  WBC 19.7*  --   NEUTROABS 16.7*  --   HGB 11.3* 12.6*  HCT 33.1* 37.0*  MCV 94.0  --   PLT PLATELET CLUMPS NOTED ON SMEAR, COUNT APPEARS ADEQUATE  --    Cardiac Enzymes:  Recent Labs Lab 12/29/13 1300  TROPONINI <0.30    BNP (last 3 results) No results found for this basename: PROBNP,  in the last 8760 hours CBG: No results found for this basename: GLUCAP,  in the last 168 hours  Radiological Exams on Admission: Dg Chest 2 View  12/29/2013   CLINICAL DATA:  Leg weakness.  EXAM: CHEST  2 VIEW  COMPARISON:  June 08, 2012.  FINDINGS: The heart size and mediastinal contours are within normal limits. Status post coronary artery bypass graft. No pneumothorax or pleural effusion is noted. Both lungs are clear. The visualized skeletal structures are unremarkable.  IMPRESSION: No acute cardiopulmonary abnormality seen.   Electronically Signed   By: Roque Lias M.D.   On: 12/29/2013 16:51   Ct Head  Wo Contrast  12/29/2013   CLINICAL DATA:  Code stroke, left facial droop  EXAM: CT HEAD WITHOUT CONTRAST  TECHNIQUE: Contiguous axial images were obtained from the base of the skull through the vertex without intravenous contrast.  COMPARISON:  MRI brain 07/06/2012  FINDINGS: There is no evidence of mass effect, midline shift, or extra-axial fluid collections. There is no evidence of a space-occupying lesion or  intracranial hemorrhage. There is no evidence of a cortical-based area of acute infarction. There is an old left basal ganglia lacunar infarct. There is an old cortical infarct of the right parietal lobe. There is generalized cerebral atrophy. There is periventricular white matter low attenuation likely secondary to microangiopathy.  The ventricles and sulci are appropriate for the patient's age. The basal cisterns are patent.  Visualized portions of the orbits are unremarkable. The visualized portions of the paranasal sinuses and mastoid air cells are unremarkable. Cerebrovascular atherosclerotic calcifications are noted.  The osseous structures are unremarkable.  IMPRESSION: No acute intracranial pathology.  These results were called by telephone at the time of interpretation on 12/29/2013 at 1:56 pm to Dr. Amada JupiterKIRKPATRICK, who verbally acknowledged these results.   Electronically Signed   By: Elige KoHetal  Patel   On: 12/29/2013 13:56   Mr Maxine GlennMra Head Wo Contrast  12/29/2013   CLINICAL DATA:  Left leg weakness and left facial droop.  Stroke.  EXAM: MRI HEAD WITHOUT CONTRAST  MRA HEAD WITHOUT CONTRAST  TECHNIQUE: Multiplanar, multiecho pulse sequences of the brain and surrounding structures were obtained without intravenous contrast. Angiographic images of the head were obtained using MRA technique without contrast.  COMPARISON:  MRI 07/06/2012  FINDINGS: MRI HEAD FINDINGS  Acute infarct in the head of the caudate on the left measuring approximately 15 mm. Adjacent to this there is an area of chronic hemorrhagic infarct which was present previously involving the internal capsule and putamen.  Chronic microvascular ischemic change in the white matter and thalamus. Chronic lacune in the left paracentral pons. Chronic small infarct in the right parietal lobe is unchanged.  Negative for mass or edema. No hydrocephalus or shift of the midline structures.  MRA HEAD FINDINGS  Image quality degraded by significant artifact from  motion as well as decreased signal in the circle of Willis diffusely which may be due to patient position or severe atherosclerotic disease.  Both vertebral arteries are patent. The basilar is patent. Focal severe stenosis in the mid left posterior cerebral artery unchanged from 2007.  Internal carotid artery is patent bilaterally with a moderate to severe stenosis in the right cavernous carotid. Left cavernous carotid patent. Decreased signal in the anterior and middle cerebral arteries bilaterally which is at least partially due to advanced atherosclerotic disease which may have progressed since 2007. Some this could be due to artifact.  IMPRESSION: Acute infarct head of the caudate on the left.  Moderate chronic ischemic changes.  Severe intracranial atherosclerotic disease. There is decreased signal in the anterior and middle cerebral arteries bilaterally felt to be due to combination of advanced atherosclerotic disease and and a degree of artifact due to technique.   Electronically Signed   By: Marlan Palauharles  Clark M.D.   On: 12/29/2013 20:07   Mr Brain Wo Contrast  12/29/2013   CLINICAL DATA:  Left leg weakness and left facial droop.  Stroke.  EXAM: MRI HEAD WITHOUT CONTRAST  MRA HEAD WITHOUT CONTRAST  TECHNIQUE: Multiplanar, multiecho pulse sequences of the brain and surrounding structures were obtained without intravenous contrast. Angiographic images of  the head were obtained using MRA technique without contrast.  COMPARISON:  MRI 07/06/2012  FINDINGS: MRI HEAD FINDINGS  Acute infarct in the head of the caudate on the left measuring approximately 15 mm. Adjacent to this there is an area of chronic hemorrhagic infarct which was present previously involving the internal capsule and putamen.  Chronic microvascular ischemic change in the white matter and thalamus. Chronic lacune in the left paracentral pons. Chronic small infarct in the right parietal lobe is unchanged.  Negative for mass or edema. No  hydrocephalus or shift of the midline structures.  MRA HEAD FINDINGS  Image quality degraded by significant artifact from motion as well as decreased signal in the circle of Willis diffusely which may be due to patient position or severe atherosclerotic disease.  Both vertebral arteries are patent. The basilar is patent. Focal severe stenosis in the mid left posterior cerebral artery unchanged from 2007.  Internal carotid artery is patent bilaterally with a moderate to severe stenosis in the right cavernous carotid. Left cavernous carotid patent. Decreased signal in the anterior and middle cerebral arteries bilaterally which is at least partially due to advanced atherosclerotic disease which may have progressed since 2007. Some this could be due to artifact.  IMPRESSION: Acute infarct head of the caudate on the left.  Moderate chronic ischemic changes.  Severe intracranial atherosclerotic disease. There is decreased signal in the anterior and middle cerebral arteries bilaterally felt to be due to combination of advanced atherosclerotic disease and and a degree of artifact due to technique.   Electronically Signed   By: Marlan Palau M.D.   On: 12/29/2013 20:07    EKG: Independently reviewed.  Assessment/Plan Principal Problem:   Acute ischemic left ACA stroke Active Problems:   Hypertension, accelerated   ESRD on dialysis   CVD (cerebrovascular disease)   SIRS (systemic inflammatory response syndrome)    Acute ischemic Left caudate nucleus stroke - in setting of fairly severe intracranial CVD as seen on MRA  ASA and Plavix  2d echo, carotid dopplers, A1C, lipid panel  Permissive HTN - spoke with neurology on this issue: given his longstanding history of accelerated HTN requiring 5 meds to control at baseline, feel best approach to achieve goal BPs is to stop 4 out of the 5, leaving him on the clonadine (to prevent rebound HTN), re-introduce other BP meds as needed.  PT/OT/SLP  This LEFT sided  acute ischemic stroke, while present and certainly a cause for concern does not explain the patients LEFT sided weakness as discussed by neurology.  ESRD - called and left message with ESRD consult line for routine dialysis likely on Sat as inpatient.  SIRS - cannot rule out infection in this dialysis patient, blood cultures pending, empiric zosyn / vanc ordered.  Repeat CBC/BMP in AM.    Code Status: Full  Family Communication: Sister at bedside Disposition Plan: Admit to inpatient   Time spent: 70 min  Cathe Bilger M. [facility] Pager [phone]  If 7AM-7PM, please contact the day team taking care of the patient [email or web address] Password [email or web address] 12/29/2013, 9:58 PM

## 2013-12-29 NOTE — Progress Notes (Signed)
Deacess graft ;no bleeding noted,pressure & drsg applied;pt tolerated well.

## 2013-12-29 NOTE — ED Notes (Signed)
Reminded pt that we needed a urine sample. Gave patient a new urinal for when he's ready.

## 2013-12-29 NOTE — ED Notes (Signed)
Phlebotomy at bedside.

## 2013-12-29 NOTE — Progress Notes (Signed)
ANTIBIOTIC CONSULT NOTE - INITIAL  Pharmacy Consult for Vancomycin/Zosyn Indication: sepsis  Allergies  Allergen Reactions  . Shrimp [Shellfish Allergy] Shortness Of Breath  . Atorvastatin Other (See Comments)    REACTION: weakness  . Insulins Other (See Comments)    Patient unsure as to the kind of insulin but states that it has previously caused seizures. This occurred in the hospital in 2006; but in most recent hospitalization (Jan 2013) received insulin but did not have reaction  . Simvastatin Other (See Comments)    REACTION: abnormal liver tests  . Ultram [Tramadol] Other (See Comments)    Liver enzyme     Patient Measurements: Height: 6' (182.9 cm) Weight: 219 lb (99.338 kg) IBW/kg (Calculated) : 77.6  Vital Signs: Temp: 101.2 F (38.4 C) (10/15 2111) Temp Source: Rectal (10/15 2111) BP: 140/80 mmHg (10/15 2045) Pulse Rate: 109 (10/15 2045)  Labs:  Recent Labs  12/29/13 1324 12/29/13 1333  WBC 19.7*  --   HGB 11.3* 12.6*  PLT PLATELET CLUMPS NOTED ON SMEAR, COUNT APPEARS ADEQUATE  --   CREATININE 6.72* 7.10*   Estimated Creatinine Clearance: 14.7 ml/min (by C-G formula based on Cr of 7.1). No results found for this basename: VANCOTROUGH, VANCOPEAK, VANCORANDOM, GENTTROUGH, GENTPEAK, GENTRANDOM, TOBRATROUGH, TOBRAPEAK, TOBRARND, AMIKACINPEAK, AMIKACINTROU, AMIKACIN,  in the last 72 hours   Microbiology: No results found for this or any previous visit (from the past 720 hour(s)).  Medical History: Past Medical History  Diagnosis Date  . ACHALASIA   . CEREBROVASCULAR ACCIDENT, HX OF   . Headache(784.0)   . HYPERLIPIDEMIA     takes crestor daily  . RENAL CALCULUS, HX OF   . RENAL INSUFFICIENCY   . H/O hiatal hernia   . CAD (coronary artery disease), 3 vessel significant disease.    . Impaired glucose tolerance   . S/P PTCA (percutaneous transluminal coronary angioplasty), 06/30/11 07/01/2011  . CKD (chronic kidney disease) stage 4, GFR 15-29 ml/min   .  History of diastolic dysfunction, grade 2 by echo   . S/P coronary artery stent placement, PTCA/DES Resolute to mid-LAD via the LIMA graft, and PTCA of apical 95% stenosis 07/05/11 07/04/2011  . DEPRESSION   . Sleep apnea     SLEEP STUDY IN [place] , NO MACHINE YET  1 YEAR  . Seizures     INSULIN INDUCED  . ANEMIA-NOS   . NSTEMI (non-ST elevated myocardial infarction), 06/30/11 07/01/2011  . Stroke     93/2005/2006/2007;left sided weakness  . DEGENERATIVE JOINT DISEASE, SHOULDER     knee  . Gout     takes Uloric daily  . GERD     no meds required  . History of colon polyps   . HYPERTENSION     takes Norvasc,Catapress,Hydralazine,Imdur,and Metoprolol daily  . Insomnia     takes Ambien nightly  . Hyperlipidemia    Assessment: 53 y/o M from dialysis w/ left sided weakness. HD schedule TThSat. Temp 101.2, BP 140/80, WBC 19.7.  Empiric antibiotics for potential sepsis.   Goal of Therapy:  Pre-dialysis vancomycin 15-25  Plan:  Vancomyin 2000 mg x 1 in ED, then Vancomycin 1000mg  qHD Zosyn 3.375g x 1 in ED, then Zosyn 2.25g q8h F/u vancomycin trough, renal function, cultures and suseptibilities  Marisue Brooklyn 12/29/2013,9:35 PM

## 2013-12-29 NOTE — ED Notes (Signed)
Per EMS: Pt from dialysis with left sided weakness, left sided facial droop that began at 1100. Dialysis faculty noted pt finished dialysis and while walking to the bathroom pt became unsteady on his feet. Pt with hx of stroke in 2007 with left sided deficits but noted deficits feel worse at this time. nad noted.

## 2013-12-29 NOTE — ED Notes (Signed)
MRI called for update delay.

## 2013-12-29 NOTE — Progress Notes (Signed)
I agree with the assessment and plan.  Lysle Pearlachel Celise Bazar, PharmD, BCPS Pager # [phone] 12/29/2013 9:49 PM

## 2013-12-29 NOTE — ED Notes (Signed)
IV team repaged for shunt deaccess and IV start, This RN unsuccessful with IV start.

## 2013-12-29 NOTE — ED Provider Notes (Signed)
Patient be admitted to the hospital for further evaluation and care.  Spoke with neurology and the, [facility].  Patient does have a CVA noted on MRI  Carlyle Dolly, PA-C 12/29/13 2120

## 2013-12-29 NOTE — ED Notes (Signed)
This RN attempted report to the floor.  Room currently does not have a bed (has been ordered) and receiving RN requested a hold on report until the bed arrives.

## 2013-12-29 NOTE — ED Notes (Signed)
IV team paged to de access pt shunt.

## 2013-12-29 NOTE — ED Notes (Signed)
Patient does not create much urine at this stage in his kidney failure.  Unable to obtain sufficient urine for sample for requested tests.

## 2013-12-29 NOTE — ED Notes (Signed)
In and out cath attempted, resistance met and pt in pain. Gerald Stabs, [place] notified.

## 2013-12-30 ENCOUNTER — Encounter (HOSPITAL_COMMUNITY): Payer: Self-pay | Admitting: General Practice

## 2013-12-30 DIAGNOSIS — N390 Urinary tract infection, site not specified: Secondary | ICD-10-CM

## 2013-12-30 DIAGNOSIS — I639 Cerebral infarction, unspecified: Secondary | ICD-10-CM

## 2013-12-30 DIAGNOSIS — E785 Hyperlipidemia, unspecified: Secondary | ICD-10-CM

## 2013-12-30 LAB — LIPID PANEL
CHOL/HDL RATIO: 3.3 ratio
CHOLESTEROL: 157 mg/dL (ref 0–200)
HDL: 47 mg/dL (ref >39)
LDL Cholesterol: 89 mg/dL (ref 0–99)
Triglycerides: 107 mg/dL (ref <150)
VLDL: 21 mg/dL (ref 0–40)

## 2013-12-30 LAB — HEMOGLOBIN A1C
Hgb A1c MFr Bld: 5.6 % (ref <5.7)
MEAN PLASMA GLUCOSE: 114 mg/dL (ref <117)

## 2013-12-30 LAB — CBC
HCT: 25.6 % — ABNORMAL LOW (ref 39.0–52.0)
Hemoglobin: 8.7 g/dL — ABNORMAL LOW (ref 13.0–17.0)
MCH: 31.4 pg (ref 26.0–34.0)
MCHC: 34 g/dL (ref 30.0–36.0)
MCV: 92.4 fL (ref 78.0–100.0)
PLATELETS: 207 10*3/uL (ref 150–400)
RBC: 2.77 MIL/uL — ABNORMAL LOW (ref 4.22–5.81)
RDW: 13.7 % (ref 11.5–15.5)
WBC: 14.8 10*3/uL — ABNORMAL HIGH (ref 4.0–10.5)

## 2013-12-30 LAB — URINALYSIS, ROUTINE W REFLEX MICROSCOPIC
BILIRUBIN URINE: NEGATIVE
Glucose, UA: NEGATIVE mg/dL
Ketones, ur: NEGATIVE mg/dL
NITRITE: NEGATIVE
Specific Gravity, Urine: 1.017 (ref 1.005–1.030)
UROBILINOGEN UA: 0.2 mg/dL (ref 0.0–1.0)
pH: 8.5 — ABNORMAL HIGH (ref 5.0–8.0)

## 2013-12-30 LAB — BASIC METABOLIC PANEL
ANION GAP: 14 (ref 5–15)
BUN: 34 mg/dL — ABNORMAL HIGH (ref 6–23)
CO2: 28 meq/L (ref 19–32)
Calcium: 8.8 mg/dL (ref 8.4–10.5)
Chloride: 92 mEq/L — ABNORMAL LOW (ref 96–112)
Creatinine, Ser: 8.59 mg/dL — ABNORMAL HIGH (ref 0.50–1.35)
GFR calc Af Amer: 7 mL/min — ABNORMAL LOW (ref >90)
GFR, EST NON AFRICAN AMERICAN: 6 mL/min — AB (ref >90)
Glucose, Bld: 132 mg/dL — ABNORMAL HIGH (ref 70–99)
Potassium: 4.1 mEq/L (ref 3.7–5.3)
SODIUM: 134 meq/L — AB (ref 137–147)

## 2013-12-30 LAB — RAPID URINE DRUG SCREEN, HOSP PERFORMED
AMPHETAMINES: NOT DETECTED
Barbiturates: NOT DETECTED
Benzodiazepines: POSITIVE — AB
COCAINE: NOT DETECTED
Opiates: NOT DETECTED
TETRAHYDROCANNABINOL: NOT DETECTED

## 2013-12-30 LAB — MRSA PCR SCREENING: MRSA BY PCR: NEGATIVE

## 2013-12-30 LAB — URINE MICROSCOPIC-ADD ON

## 2013-12-30 LAB — GLUCOSE, CAPILLARY: GLUCOSE-CAPILLARY: 155 mg/dL — AB (ref 70–99)

## 2013-12-30 MED ORDER — CLONIDINE HCL 0.1 MG PO TABS
0.1000 mg | ORAL_TABLET | Freq: Two times a day (BID) | ORAL | Status: DC
  Administered 2013-12-30 – 2014-01-01 (×4): 0.1 mg via ORAL
  Filled 2013-12-30 (×4): qty 1

## 2013-12-30 MED ORDER — METOPROLOL TARTRATE 25 MG PO TABS
25.0000 mg | ORAL_TABLET | Freq: Two times a day (BID) | ORAL | Status: DC
  Administered 2013-12-30 – 2014-01-01 (×5): 25 mg via ORAL
  Filled 2013-12-30 (×5): qty 1

## 2013-12-30 NOTE — ED Provider Notes (Signed)
Medical screening examination/treatment/procedure(s) were conducted as a shared visit with non-physician practitioner(s) and myself.  I personally evaluated the patient during the encounter.   EKG Interpretation   Date/Time:  Thursday December 29 2013 13:37:03 EDT Ventricular Rate:  103 PR Interval:  146 QRS Duration: 72 QT Interval:  400 QTC Calculation: 524 R Axis:   -22 Text Interpretation:  Sinus tachycardia Probable left atrial enlargement  Borderline left axis deviation Abnormal R-wave progression, late  transition Abnormal inferior Q waves Abnormal T, consider ischemia,  lateral leads Minimal ST elevation, inferior leads Prolonged QT interval  No significant change since last tracing Confirmed by Lazariah Savard  MD, Greydis Stlouis  ([id number]) on 12/29/2013 1:42:40 PM      [name] is a 53 y.o. male hx of ESRD on HD (last HD was today), HTN, HL, prior strokes came here as code stroke. Went to dialysis today. Around 11 am, began to have L facial droop and L leg weakness. Finished treatment and sent here for eval. Came as code stroke. Neuro evaluated. Patient has mild L facial droop with mild weakness L side. CT head showed chronic changes. WBC 20. CXR and UA ordered. Neuro recommend MRI. Signed out to the oncoming team.  Angiocath insertion Performed by: Silverio Lay, Zalika Tieszen  Consent: Verbal consent obtained. Risks and benefits: risks, benefits and alternatives were discussed Time out: Immediately prior to procedure a "time out" was called to verify the correct patient, procedure, equipment, support staff and site/side marked as required.  Preparation: Patient was prepped and draped in the usual sterile fashion.  Vein Location: R EJ Ultrasound Guided  Gauge: 20  Normal blood return and flush without difficulty Patient tolerance: Patient tolerated the procedure well with no immediate complications.      Richardean Canal, MD 12/30/13 [phone]

## 2013-12-30 NOTE — Evaluation (Signed)
Physical Therapy Evaluation Patient Details Name: [name] MRN: [id number] DOB: Aug 09, 1960 Today's Date: 12/30/2013   History of Present Illness  Patient is a 53 y/o male admitted with left sided weakness onset during dialysis with h/o ESRD on HE TTS, previous CVA, CABG, achalasia, sleep apnea, CKD.  MRI showed acute left caudate infarct.  Clinical Impression  Patient presents with decreased mobility due to deficits listed in PT problem list below.  Mainly limited by weakness, fatigue and left LE weakness.  Do not feel will need specific follow up PT at d/c, but will benefit from skilled PT in the acute setting to maximize independence and allow return to independent with walker at d/c.    Follow Up Recommendations No PT follow up    Equipment Recommendations  None recommended by PT    Recommendations for Other Services       Precautions / Restrictions Precautions Precautions: Fall Precaution Comments: due to decreased gait velocity, left side weakness      Mobility  Bed Mobility Overal bed mobility: Modified Independent                Transfers Overall transfer level: Modified independent Equipment used: Rolling walker (2 wheeled)                Ambulation/Gait Ambulation/Gait assistance: Supervision Ambulation Distance (Feet): 200 Feet Assistive device: Rolling walker (2 wheeled) Gait Pattern/deviations: Step-through pattern;Decreased stride length   Gait velocity interpretation: <1.8 ft/sec, indicative of risk for recurrent falls General Gait Details: no left foot drag, step length equal but significantly decreased, slow gait and labored with fatigue following.    Stairs            Wheelchair Mobility    Modified Rankin (Stroke Patients Only) Modified Rankin (Stroke Patients Only) Pre-Morbid Rankin Score: No symptoms Modified Rankin: Moderate disability     Balance Overall balance assessment: Needs assistance Sitting-balance  support: No upper extremity supported;Feet supported Sitting balance-Leahy Scale: Good     Standing balance support: No upper extremity supported Standing balance-Leahy Scale: Fair Standing balance comment: able to balance static eyes open and eyes closed 10 seconds, with partial tandem left foot in back fatigued after about 15 seconds                             Pertinent Vitals/Pain Pain Assessment: 0-10 Pain Score: 7  (changed to 5/10 up walking) Pain Location: left hip Pain Descriptors / Indicators: Sharp Pain Intervention(s): Monitored during session    Home Living Family/patient expects to be discharged to:: Private residence Living Arrangements: Other relatives (nephew) Available Help at Discharge: Family;Available PRN/intermittently Type of Home: House Home Access: Stairs to enter   Entrance Stairs-Number of Steps: 1 Home Layout: One level Home Equipment: Walker - 2 wheels;Cane - single point      Prior Function Level of Independence: Independent               Hand Dominance   Dominant Hand: Right    Extremity/Trunk Assessment   Upper Extremity Assessment: LUE deficits/detail       LUE Deficits / Details: limited shoulder flexion due to pain with fistula in that arm recent fistulagram; strength grossly 4-/5 shoulder flexion and 4/5 elbow flexion; grip and fine motor intact   Lower Extremity Assessment: LLE deficits/detail;RLE deficits/detail RLE Deficits / Details: AROM WFL, strength hip flexion 4-.5, knee extension 5/5, flexion 4+/5, ankle DF 4+/5; sensation and gross motor  intact LLE Deficits / Details: AAROM WFL, strength hip flexion 3-/5, knee extension 4/5, flexion 3/5, ankle DF 4+/5; sensation and gross motor intact     Communication   Communication: No difficulties  Cognition Arousal/Alertness: Awake/alert Behavior During Therapy: WFL for tasks assessed/performed;Flat affect Overall Cognitive Status: Within Functional Limits for  tasks assessed                      General Comments      Exercises        Assessment/Plan    PT Assessment Patient needs continued PT services  PT Diagnosis Abnormality of gait;Generalized weakness   PT Problem List Decreased strength;Decreased activity tolerance;Decreased balance;Decreased mobility  PT Treatment Interventions DME instruction;Gait training;Therapeutic exercise;Balance training;Stair training;Functional mobility training;Therapeutic activities;Patient/family education   PT Goals (Current goals can be found in the Care Plan section) Acute Rehab PT Goals Patient Stated Goal: To return home PT Goal Formulation: With patient Time For Goal Achievement: 01/06/14 Potential to Achieve Goals: Good    Frequency Min 4X/week   Barriers to discharge        Co-evaluation               End of Session Equipment Utilized During Treatment: Gait belt Activity Tolerance: Patient tolerated treatment well Patient left: in chair;with call bell/phone within reach           Time: 0856-0932 PT Time Calculation (min): 36 min   Charges:   PT Evaluation $Initial PT Evaluation Tier I: 1 Procedure PT Treatments $Gait Training: 23-37 mins   PT G Codes:          WYNN,CYNDI 12/30/2013, 9:44 AM Sheran Lawlessyndi Wynn, PT [phone] 12/30/2013

## 2013-12-30 NOTE — Evaluation (Signed)
Speech Language Pathology Evaluation Patient Details Name: [name] MRN: [id number] DOB: 1960-11-01 Today's Date: 12/30/2013 Time: 1937-9024 SLP Time Calculation (min): 10 min  Problem List:  Patient Active Problem List   Diagnosis Date Noted  . CVD (cerebrovascular disease) 12/29/2013  . Acute ischemic left ACA stroke 12/29/2013  . SIRS (systemic inflammatory response syndrome) 12/29/2013  . Abnormality of gait 12/22/2013  . Exertional angina 06/24/2013  . Venereal disease, unspecified 11/10/2012  . ESRD on dialysis 07/09/2012  . Left-sided weakness 07/02/2012  . Subclavian artery stenosis 03/26/2012  . Left leg weakness 02/04/2012  . Itching 02/04/2012  . Mouth abscess 11/11/2011  . Increased prostate specific antigen (PSA) velocity 11/11/2011  . Right ankle pain 09/10/2011  . S/P coronary artery stent placement, PTCA/DES Resolute to mid-LAD via the LIMA graft, and PTCA of apical 95% stenosis 06/2011 07/04/2011  . History of diastolic dysfunction, grade 2 by echo 07/03/2011  . S/P PTCA (percutaneous transluminal coronary angioplasty), 06/30/11 07/01/2011  . NSTEMI (non-ST elevated myocardial infarction), 06/30/11 07/01/2011  . Stable angina: with acute T wave inversion.And unstable angina 06/30/2011  . Impaired glucose tolerance 05/13/2011  . 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 Preventative health care 05/10/2011  . S/P CABG x 4: 04/01/11-(LIMA to LAD, SVG to PL branch of RCA, Sequential SVG to OM1/2) 04/07/2011  . CAD, 3 vessel significant disease at cath this admission. underwent CABG in Jan 2013 03/28/2011  . Gout 03/27/2011  . ANEMIA-NOS 05/22/2008  . PERIPHERAL VASCULAR DISEASE 05/22/2008  . ACHALASIA 05/22/2008  . GERD 05/22/2008  . Hyperlipidemia 10/25/2006  . DEPRESSION 10/25/2006  . Hypertension, accelerated 10/25/2006  . HEMORRHOIDS 10/25/2006  . ALLERGIC RHINITIS 10/25/2006  . DEGENERATIVE JOINT DISEASE, SHOULDER 10/25/2006  . Headache 10/25/2006  .  CVA (cerebral infarction) 10/25/2006  . RENAL CALCULUS, HX OF 10/25/2006   Past Medical History:  Past Medical History  Diagnosis Date  . ACHALASIA   . CEREBROVASCULAR ACCIDENT, HX OF   . Headache(784.0)   . HYPERLIPIDEMIA     takes crestor daily  . RENAL CALCULUS, HX OF   . RENAL INSUFFICIENCY   . H/O hiatal hernia   . CAD (coronary artery disease), 3 vessel significant disease.    . Impaired glucose tolerance   . S/P PTCA (percutaneous transluminal coronary angioplasty), 06/30/11 07/01/2011  . CKD (chronic kidney disease) stage 4, GFR 15-29 ml/min   . History of diastolic dysfunction, grade 2 by echo   . S/P coronary artery stent placement, PTCA/DES Resolute to mid-LAD via the LIMA graft, and PTCA of apical 95% stenosis 07/05/11 07/04/2011  . DEPRESSION   . Sleep apnea     SLEEP STUDY IN [place] , NO MACHINE YET  1 YEAR  . Seizures     INSULIN INDUCED  . ANEMIA-NOS   . NSTEMI (non-ST elevated myocardial infarction), 06/30/11 07/01/2011  . Stroke     93/2005/2006/2007;left sided weakness  . DEGENERATIVE JOINT DISEASE, SHOULDER     knee  . Gout     takes Uloric daily  . GERD     no meds required  . History of colon polyps   . HYPERTENSION     takes Norvasc,Catapress,Hydralazine,Imdur,and Metoprolol daily  . Insomnia     takes Ambien nightly  . Hyperlipidemia    Past Surgical History:  Past Surgical History  Procedure Laterality Date  . Tonsillectomy    . Stomach surgery  2009    achalasia  . Knee surgery  01/07/2011    right  .  Insertion of dialysis catheter  04/01/2011    Procedure: INSERTION OF DIALYSIS CATHETER;  Surgeon: Nilda SimmerBrian Liang-Yu Chen, MD;  [facility];  Service: Vascular;  Laterality: N/A;  . Cardiac catheterization    . Av fistula placement  12/02/2011    Procedure: ARTERIOVENOUS (AV) FISTULA CREATION;  Surgeon: Fransisco HertzBrian L Chen, MD;  [facility];  Service: Vascular;  Laterality: Left;  Ultrasound Guided  . Subclavian stent placement  12/20/2011  .  Coronary artery bypass graft  04/01/2011    Procedure: CORONARY ARTERY BYPASS GRAFTING (CABG);  Surgeon: Kathlee NationsPeter Van Suann Larryrigt III, MD;  [facility];  Service: Open Heart Surgery;  Laterality: N/A;  . Coronary angi[facility]      [facility]  . Colonoscopy    . Hyperlipidemia    . Ligation of arteriovenous  fistula Left 06/08/2012    Procedure: LIGATION OF ARTERIOVENOUS  FISTULA;  Surgeon: Fransisco HertzBrian L Chen, MD;  [facility];  Service: Vascular;  Laterality: Left;  . Av fistula placement Left 06/08/2012    Procedure: ARTERIOVENOUS (AV) FISTULA CREATION;  Surgeon: Fransisco HertzBrian L Chen, MD;  [facility];  Service: Vascular;  Laterality: Left;   HPI:  Patient is a 53 y/o male admitted with left sided weakness onset during dialysis with h/o ESRD on HE TTS, previous CVA, CABG, achalasia, sleep apnea, CKD.  MRI showed acute left caudate infarct.    Assessment / Plan / Recommendation Clinical Impression  Pt presents with normal speech-language function s/p left CVA: fluent output, good articulatory clarity; no language deficit.  No SLP f/u needed.      SLP Assessment  Patient does not need any further Speech Lanaguage Pathology Services    Follow Up Recommendations  None            Pertinent Vitals/Pain Pain Assessment: 0-10 Pain Score: 7  Pain Descriptors / Indicators: Sore Pain Intervention(s): Monitored during session   SLP Goals     SLP Evaluation Prior Functioning  Type of Home: House  Lives With: Friend(s) Available Help at Discharge: Family;Available PRN/intermittently   Cognition  Overall Cognitive Status: Within Functional Limits for tasks assessed Orientation Level: Oriented X4    Comprehension  Auditory Comprehension Overall Auditory Comprehension: Appears within functional limits for tasks assessed Visual Recognition/Discrimination Discrimination: Within Function Limits Reading Comprehension Reading Status: Within funtional limits    Expression Expression Primary  Mode of Expression: Verbal Verbal Expression Overall Verbal Expression: Appears within functional limits for tasks assessed Written Expression Dominant Hand: Right Written Expression: Not tested   Oral / Motor Oral Motor/Sensory Function Overall Oral Motor/Sensory Function: Appears within functional limits for tasks assessed Motor Speech Overall Motor Speech: Appears within functional limits for tasks assessed   Judah Carchi L. Samson Fredericouture, [place] CCC/SLP Pager [phone]      Blenda MountsCouture, Calin Fantroy [name] 12/30/2013, 4:59 PM

## 2013-12-30 NOTE — Progress Notes (Addendum)
STROKE TEAM PROGRESS NOTE   HISTORY  [name] is an 53 y.o. man pmh most significant for ESRD on HD, previous CVA, CAD s/p CABG, HTN, and HLD p/w exacerbating left sided weakness while at HD. Pt states he was last himself at 0630 where he then went to HD. During HD he started noticing some numbness and then frank weakness of his left side and the nurse noticed some weakness. It appears that he was having some periods of hypotension during his HD session the pt believes he went only as low as SBP 110 and he feels this was about the same time his symptoms became worse which was at about 1100. He completed HD and didn't have any resolution of his symptoms and was then transferred by EMS. He denied having any CP, SOB, HA, blurry vision, dysphagia, or dysarthria. The patient has had significant hx of CVA and states he feels these changes are different. The patient is currently taking ASA and plavix which he reports compliance. It is uncler whether the patient is still taking Brilinta after CABG back 04/01/11.   Date last known well: 12/29/13  Time last known well: 0630  tPA Given: No: given that pt has documented concern for previous CVA w/in the last month (12/17/13) and LKW is outside tpa window   SUBJECTIVE (INTERVAL HISTORY) No family members present today. The patient notes that his left leg is still weak. He also reported that his blood pressure was low in dialysis at the time of this event, about 99/50. He had experienced a similar episode earlier this month and had seen Dr. Terrace Arabia in the office.  OBJECTIVE Temp:  [98.2 F (36.8 C)-101.2 F (38.4 C)] 98.3 F (36.8 C) (10/16 0828) Pulse Rate:  [82-113] 82 (10/16 0828) Cardiac Rhythm:  [-] Normal sinus rhythm (10/16 0030) Resp:  [14-33] 19 (10/16 0828) BP: (104-171)/(63-92) 171/88 mmHg (10/16 0828) SpO2:  [95 %-100 %] 96 % (10/16 0828) Weight:  [219 lb (99.338 kg)] 219 lb (99.338 kg) (10/15 1339)  No results found for this basename:  GLUCAP,  in the last 168 hours  Recent Labs Lab 12/29/13 1324 12/29/13 1333 12/30/13 0530  NA 136* 135* 134*  K 4.5 4.4 4.1  CL 90* 93* 92*  CO2 26  --  28  GLUCOSE 166* 172* 132*  BUN 23 28* 34*  CREATININE 6.72* 7.10* 8.59*  CALCIUM 9.2  --  8.8    Recent Labs Lab 12/29/13 1324  AST 32  ALT 11  ALKPHOS 49  BILITOT 0.9  PROT 8.0  ALBUMIN 3.8    Recent Labs Lab 12/29/13 1324 12/29/13 1333 12/30/13 0530  WBC 19.7*  --  14.8*  NEUTROABS 16.7*  --   --   HGB 11.3* 12.6* 8.7*  HCT 33.1* 37.0* 25.6*  MCV 94.0  --  92.4  PLT PLATELET CLUMPS NOTED ON SMEAR, COUNT APPEARS ADEQUATE  --  207    Recent Labs Lab 12/29/13 1300  TROPONINI <0.30    Recent Labs  12/29/13 1336  LABPROT 16.0*  INR 1.27   No results found for this basename: COLORURINE, APPERANCEUR, LABSPEC, PHURINE, GLUCOSEU, HGBUR, BILIRUBINUR, KETONESUR, PROTEINUR, UROBILINOGEN, NITRITE, LEUKOCYTESUR,  in the last 72 hours     Component Value Date/Time   CHOL 157 12/30/2013 0530   TRIG 107 12/30/2013 0530   HDL 47 12/30/2013 0530   CHOLHDL 3.3 12/30/2013 0530   VLDL 21 12/30/2013 0530   LDLCALC 89 12/30/2013 0530   Lab Results  Component Value Date   HGBA1C 5.4 11/09/2013   No results found for this basename: labopia, cocainscrnur, labbenz, amphetmu, thcu, labbarb     Recent Labs Lab 12/29/13 1324  ETH <11    Dg Chest 2 View 12/29/2013    No acute cardiopulmonary abnormality seen.     Ct Head Wo Contrast 12/29/2013    No acute intracranial pathology.    Mr Gregory GlennMra Head Wo Contrast 12/29/2013    Acute infarct head of the caudate on the left.  Adjacent to this there is an area of chronic  hemorrhagic infarct which was present previously involving the internal capsule and putamen. Chronic lacune in the left paracentral pons. Chronic small infarct in the right parietal lobe is unchanged. Chronic microvascular ischemic change in the white matter and thalamus.  Severe intracranial  atherosclerotic disease. There is decreased signal in the anterior and middle cerebral arteries bilaterally felt to be due to combination of advanced atherosclerotic disease and and a degree of artifact due to technique.     CUS - Bilateral: 1-39% ICA stenosis. Vertebral artery flow is antegrade.   2D echo - pending  PHYSICAL EXAM  Temp:  [98.2 F (36.8 C)-101.2 F (38.4 C)] 98.8 F (37.1 C) (10/16 1720) Pulse Rate:  [75-110] 75 (10/16 1720) Resp:  [14-27] 20 (10/16 1720) BP: (107-171)/(69-92) 130/69 mmHg (10/16 1720) SpO2:  [95 %-100 %] 98 % (10/16 1720)  General - Well nourished, well developed, in no apparent distress.  Ophthalmologic - not able to see through.  Cardiovascular - Regular rate and rhythm with no murmur.  Mental Status -  Level of arousal and orientation to time, place, and person were intact. Language including expression, naming, repetition, comprehension was assessed and found intact.  Cranial Nerves II - XII - II - Visual field intact OU. III, IV, VI - Extraocular movements intact. V - Facial sensation intact bilaterally. VII - Facial movement intact bilaterally. VIII - Hearing & vestibular intact bilaterally. X - Palate elevates symmetrically. XI - Chin turning & shoulder shrug intact bilaterally. XII - Tongue protrusion intact.  Motor Strength - The patient's strength was 4+/5 LUE and LLE but 5/5 RUE and RLE and pronator drift was absent.  Bulk was normal and fasciculations were absent.   Motor Tone - Muscle tone was assessed at the neck and appendages and was normal.  Reflexes - The patient's reflexes were 1+ in all extremities and he had no pathological reflexes.  Sensory - Light touch, temperature/pinprick were assessed and were symmetrical.    Coordination - The patient had normal movements in the hands with no ataxia or dysmetria.  Tremor was absent.  Gait and Station - deferred.   ASSESSMENT/PLAN [name] is a 53 y.o. male  with history of  ESRD on HD, previous CVA, CAD s/p CABG, HTN, and HLD presenting with left hemiparesis and left hemisensory deficits.. He did not receive IV t-PA due to late presentation and concern for possible recent stroke..   Stroke: Acute infarct head of the caudate on the left with multiple lacunar stroke at BG and pons. Infarct felt secondary to severer atherosclerotic disease aggravated by hypotension. His weakness on the left did not correlate with the acute stroke, but more consistent with the recrudescence of his old lacunar stroke. New acute stroke likely silent stroke caused by hypotension.    MRI  Acute infarct head of the caudate on the left with multiple lacunar stroke at BG and pons.  MRA  Severe  intracranial atherosclerotic disease, especially b/l MCAs.   Carotid Doppler  Preliminary report: Bilateral: 1-39% ICA stenosis. Vertebral artery flow is antegrade.   2D Echo  pending  LDL 89, not meeting the goal < 70  HgbA1c 5.6  His BP goal should be 120-150 and avoid hypotension. Need to coordinate with HD facility to avoid hypotension during dialysis.  Subcutaneous heparin for VTE prophylaxis  Renal thin liquids  Activity as tolerated  aspirin 81 mg orally every day and clopidogrel 75 mg orally every day prior to admission, now on aspirin 81 mg orally every day and clopidogrel 75 mg orally every day  Risk factor education  Ongoing aggressive risk factor management  Resultant mild left lower extremity weakness  Therapy recommendations:  No further physical or occupational therapy recommended  Disposition:  Pending  Hypertension  Home meds:   Catapres 0.2 mg twice a day and Toprol 50 mg twice a day  Blood pressure runs too low at times or during HD  Permissive hypertension (OK if < 220/120) but gradually normalize in 5-7 days  His BP goal should be 120-150 and avoid hypotension. Need to coordinate with HD facility to avoid hypotension during  dialysis.  Hyperlipidemia  Home meds:  Crestor 20 mg daily resumed in hospital  LDL 89, not meeting the goal < 70  Consider increasing Crestor to 40 mg daily.  Pt educated on compliance  Continue statin at discharge   Other Stroke Risk Factors   Obesity, Body mass index is 29.7 kg/(m^2).    Hx stroke/TIA   Family hx stroke (other)   Coronary artery disease   OSA   ESRD on HD  Other Active Problems  Fever and Leukocytosis - Zosyn and vancomycin - improving  - blood cultures pending  Hospital day # 1  Delton See PA-C [facility] Pager [phone] 12/30/2013, 9:02 AM  I, the attending vascular neurologist, have personally obtained a history, examined the patient, evaluated laboratory data, individually viewed imaging studies, and formulated the assessment and plan of care.  I have made any additions or clarifications directly to the above note and agree with the findings and plan as currently documented.   Neurology will sign off. Please call with questions. Pt has appointment with Dr. Terrace Arabia on 01/25/14. Thanks for the consult.  Marvel Plan, MD PhD Stroke Neurology 12/30/2013 6:30 PM   To contact Stroke Continuity provider, please refer to [email or web address]. After hours, contact General Neurology

## 2013-12-30 NOTE — Progress Notes (Signed)
UR complete.  Caroline Matters RN, MSN 

## 2013-12-30 NOTE — Progress Notes (Addendum)
[facility] PROGRESS NOTE  [name]:[id number] DOB: 21-Feb-1961 DOA: 12/29/2013 PCP: Oliver Barre, MD  Assessment/Plan: 53 y/o y/o with PMH of HTN, CAD s/p CABG, HPL, ESRD dialysis TTS, prior CVAs, presents to ED with Left facial droop and left sided weakness while on HD and found to have Acute CVA  1. Acute CVA; symptoms resolving; h/o CVAs -MRI: Acute infarct head of the caudate on the left, severe intracranial atherosclerosis;  -[place]: Preliminary report: Bilateral: 1-39% ICA stenosis. Vertebral artery flow is antegrade. Pend echo, PT -Pt is on ASA and plavix, he quit taking brilinta last month, cont statin   2. SIRS/sepsis with probable UTI; pend blood cultures -cont empiric atx, leukocytosis improved; febrile: 10/15 3. CAD s/p CABG; patient denies any chest pain; on medical management; cont current regimen  4. ESRD on HD; TTS; cont per nephrology; d/c IVF 5. HTN, permissive HTN; decreased clonidine, resume BB, hold terazosin, hydralazine; resume/titartye meds as needed  6. Anemia, chronic likely renal; no obvious s/s of acute bleeding; Tf with HD prn   Code Status: full Family Communication:  D/w patient (indicate person spoken with, relationship, and if by phone, the number) Disposition Plan: pend PT   Consultants:  neuro  Procedures:  none  Antibiotics:  Zosyn  (indicate start date, and stop date if known)  HPI/Subjective: alert  Objective: Filed Vitals:   12/30/13 1135  BP: 148/90  Pulse: 83  Temp: 99 F (37.2 C)  Resp: 18   No intake or output data in the 24 hours ending 12/30/13 1148 Filed Weights   12/29/13 1339  Weight: 99.338 kg (219 lb)    Exam:   General:  alert  Cardiovascular: s1,s2 rrr  Respiratory: CTA BL  Abdomen: soft, nt,nd   Musculoskeletal: no LE edema   Data Reviewed: Basic Metabolic Panel:  Recent Labs Lab 12/29/13 1324 12/29/13 1333 12/30/13 0530  NA 136* 135* 134*  K 4.5 4.4 4.1  CL 90* 93* 92*  CO2  26  --  28  GLUCOSE 166* 172* 132*  BUN 23 28* 34*  CREATININE 6.72* 7.10* 8.59*  CALCIUM 9.2  --  8.8   Liver Function Tests:  Recent Labs Lab 12/29/13 1324  AST 32  ALT 11  ALKPHOS 49  BILITOT 0.9  PROT 8.0  ALBUMIN 3.8   No results found for this basename: LIPASE, AMYLASE,  in the last 168 hours No results found for this basename: AMMONIA,  in the last 168 hours CBC:  Recent Labs Lab 12/29/13 1324 12/29/13 1333 12/30/13 0530  WBC 19.7*  --  14.8*  NEUTROABS 16.7*  --   --   HGB 11.3* 12.6* 8.7*  HCT 33.1* 37.0* 25.6*  MCV 94.0  --  92.4  PLT PLATELET CLUMPS NOTED ON SMEAR, COUNT APPEARS ADEQUATE  --  207   Cardiac Enzymes:  Recent Labs Lab 12/29/13 1300  TROPONINI <0.30   BNP (last 3 results) No results found for this basename: PROBNP,  in the last 8760 hours CBG: No results found for this basename: GLUCAP,  in the last 168 hours  No results found for this or any previous visit (from the past 240 hour(s)).   Studies: Dg Chest 2 View  12/29/2013   CLINICAL DATA:  Leg weakness.  EXAM: CHEST  2 VIEW  COMPARISON:  June 08, 2012.  FINDINGS: The heart size and mediastinal contours are within normal limits. Status post coronary artery bypass graft. No pneumothorax or pleural effusion is noted. Both lungs  are clear. The visualized skeletal structures are unremarkable.  IMPRESSION: No acute cardiopulmonary abnormality seen.   Electronically Signed   By: Roque Lias M.D.   On: 12/29/2013 16:51   Ct Head Wo Contrast  12/29/2013   CLINICAL DATA:  Code stroke, left facial droop  EXAM: CT HEAD WITHOUT CONTRAST  TECHNIQUE: Contiguous axial images were obtained from the base of the skull through the vertex without intravenous contrast.  COMPARISON:  MRI brain 07/06/2012  FINDINGS: There is no evidence of mass effect, midline shift, or extra-axial fluid collections. There is no evidence of a space-occupying lesion or intracranial hemorrhage. There is no evidence of a  cortical-based area of acute infarction. There is an old left basal ganglia lacunar infarct. There is an old cortical infarct of the right parietal lobe. There is generalized cerebral atrophy. There is periventricular white matter low attenuation likely secondary to microangiopathy.  The ventricles and sulci are appropriate for the patient's age. The basal cisterns are patent.  Visualized portions of the orbits are unremarkable. The visualized portions of the paranasal sinuses and mastoid air cells are unremarkable. Cerebrovascular atherosclerotic calcifications are noted.  The osseous structures are unremarkable.  IMPRESSION: No acute intracranial pathology.  These results were called by telephone at the time of interpretation on 12/29/2013 at 1:56 pm to Dr. Amada Jupiter, who verbally acknowledged these results.   Electronically Signed   By: Elige Ko   On: 12/29/2013 13:56   Mr Maxine Glenn Head Wo Contrast  12/29/2013   CLINICAL DATA:  Left leg weakness and left facial droop.  Stroke.  EXAM: MRI HEAD WITHOUT CONTRAST  MRA HEAD WITHOUT CONTRAST  TECHNIQUE: Multiplanar, multiecho pulse sequences of the brain and surrounding structures were obtained without intravenous contrast. Angiographic images of the head were obtained using MRA technique without contrast.  COMPARISON:  MRI 07/06/2012  FINDINGS: MRI HEAD FINDINGS  Acute infarct in the head of the caudate on the left measuring approximately 15 mm. Adjacent to this there is an area of chronic hemorrhagic infarct which was present previously involving the internal capsule and putamen.  Chronic microvascular ischemic change in the white matter and thalamus. Chronic lacune in the left paracentral pons. Chronic small infarct in the right parietal lobe is unchanged.  Negative for mass or edema. No hydrocephalus or shift of the midline structures.  MRA HEAD FINDINGS  Image quality degraded by significant artifact from motion as well as decreased signal in the circle of  Willis diffusely which may be due to patient position or severe atherosclerotic disease.  Both vertebral arteries are patent. The basilar is patent. Focal severe stenosis in the mid left posterior cerebral artery unchanged from 2007.  Internal carotid artery is patent bilaterally with a moderate to severe stenosis in the right cavernous carotid. Left cavernous carotid patent. Decreased signal in the anterior and middle cerebral arteries bilaterally which is at least partially due to advanced atherosclerotic disease which may have progressed since 2007. Some this could be due to artifact.  IMPRESSION: Acute infarct head of the caudate on the left.  Moderate chronic ischemic changes.  Severe intracranial atherosclerotic disease. There is decreased signal in the anterior and middle cerebral arteries bilaterally felt to be due to combination of advanced atherosclerotic disease and and a degree of artifact due to technique.   Electronically Signed   By: Marlan Palau M.D.   On: 12/29/2013 20:07   Mr Brain Wo Contrast  12/29/2013   CLINICAL DATA:  Left leg weakness and left  facial droop.  Stroke.  EXAM: MRI HEAD WITHOUT CONTRAST  MRA HEAD WITHOUT CONTRAST  TECHNIQUE: Multiplanar, multiecho pulse sequences of the brain and surrounding structures were obtained without intravenous contrast. Angiographic images of the head were obtained using MRA technique without contrast.  COMPARISON:  MRI 07/06/2012  FINDINGS: MRI HEAD FINDINGS  Acute infarct in the head of the caudate on the left measuring approximately 15 mm. Adjacent to this there is an area of chronic hemorrhagic infarct which was present previously involving the internal capsule and putamen.  Chronic microvascular ischemic change in the white matter and thalamus. Chronic lacune in the left paracentral pons. Chronic small infarct in the right parietal lobe is unchanged.  Negative for mass or edema. No hydrocephalus or shift of the midline structures.  MRA HEAD  FINDINGS  Image quality degraded by significant artifact from motion as well as decreased signal in the circle of Willis diffusely which may be due to patient position or severe atherosclerotic disease.  Both vertebral arteries are patent. The basilar is patent. Focal severe stenosis in the mid left posterior cerebral artery unchanged from 2007.  Internal carotid artery is patent bilaterally with a moderate to severe stenosis in the right cavernous carotid. Left cavernous carotid patent. Decreased signal in the anterior and middle cerebral arteries bilaterally which is at least partially due to advanced atherosclerotic disease which may have progressed since 2007. Some this could be due to artifact.  IMPRESSION: Acute infarct head of the caudate on the left.  Moderate chronic ischemic changes.  Severe intracranial atherosclerotic disease. There is decreased signal in the anterior and middle cerebral arteries bilaterally felt to be due to combination of advanced atherosclerotic disease and and a degree of artifact due to technique.   Electronically Signed   By: Marlan Palauharles  Clark M.D.   On: 12/29/2013 20:07    Scheduled Meds: . aspirin EC  81 mg Oral Daily  . cloNIDine  0.2 mg Oral BID  . clopidogrel  75 mg Oral Daily  . febuxostat  40 mg Oral BID  . fluocinonide cream  1 application Topical BID  . heparin  5,000 Units Subcutaneous 3 times per day  . iron polysaccharides  150 mg Oral Daily  . piperacillin-tazobactam (ZOSYN)  IV  2.25 g Intravenous 3 times per day  . piperacillin-tazobactam  3.375 g Intravenous Once  . ranolazine  500 mg Oral q morning - 10a  . rosuvastatin  20 mg Oral QPM  . [START ON 12/31/2013] vancomycin  1,000 mg Intravenous Q T,Th,Sa-HD   Continuous Infusions: . sodium chloride      Principal Problem:   Acute ischemic left ACA stroke Active Problems:   Hypertension, accelerated   ESRD on dialysis   CVD (cerebrovascular disease)   SIRS (systemic inflammatory response  syndrome)    Time spent: >35 minutes     Esperanza SheetsBURIEV, Erandi Lemma N  [facility] Pager [phone]. If 7PM-7AM, please contact night-coverage at [email or web address], password [email or web address] 12/30/2013, 11:48 AM  LOS: 1 day

## 2013-12-30 NOTE — Progress Notes (Signed)
VASCULAR LAB PRELIMINARY  PRELIMINARY  PRELIMINARY  PRELIMINARY  Carotid duplex  completed.    Preliminary report:  Bilateral:  1-39% ICA stenosis.  Vertebral artery flow is antegrade.      Charvis Lightner, RVT 12/30/2013, 11:35 AM

## 2013-12-30 NOTE — Consult Note (Signed)
[facility] Renal Consultation Note  Indication for Consultation:  Management of ESRD/hemodialysis; anemia, hypertension/volume and secondary hyperparathyroidism  HPI: [name] is a 53 y.o. male with a history of hypertension, prior CVAs, CAD s/p CABG x 4 in 03/2011 and PTCA in 06/2011, and ESRD on dialysis, starting 11/08/13, at the [facility] who has been followed by Dr. Krista Blue of [facility], seen most recently on 10/9, for evaluation of left-side weakness and worsening gait difficulty, but yesterday was sent to the ER for difficulty standing from his chair after dialysis.  He arrived at the ER with left-side weakness and left facial droop, although he denied headache, blurred vision, dysphagia, or dysarthria.  MRI of the head indicated acute infarct in the head of the caudate on the left, also with an adjacent area of chronic hemorrhagic infarct and severe atherosclerotic disease.   He remains on aspirin and Plavix, but was previously on Brilinta after his CABG in 03/2011.  Currently he is comfortable and feeling slightly better.  On Tuesday 10/13 he had his left forearm AV fistula declotted by Dr. Lucky Cowboy on AVVS in [place]. Feels better but just took pain meds for hip- hurt when he fell     Dialysis Orders:   TTS @ [place] 4 hrs     103 kg    2K/2.25Ca      325/800       Heparin 3500 U     AVF @ [facility]   Hectorol 2 mcg        No Aranesp or Venofer  Past Medical History  Diagnosis Date  . ACHALASIA   . CEREBROVASCULAR ACCIDENT, HX OF   . Headache(784.0)   . HYPERLIPIDEMIA     takes crestor daily  . RENAL CALCULUS, HX OF   . RENAL INSUFFICIENCY   . H/O hiatal hernia   . CAD (coronary artery disease), 3 vessel significant disease.    . Impaired glucose tolerance   . S/P PTCA (percutaneous transluminal coronary angioplasty), 06/30/11 07/01/2011  . CKD (chronic kidney disease) stage 4, GFR 15-29 ml/min   . History of diastolic dysfunction,  grade 2 by echo   . S/P coronary artery stent placement, PTCA/DES Resolute to mid-LAD via the LIMA graft, and PTCA of apical 95% stenosis 07/05/11 07/04/2011  . DEPRESSION   . Sleep apnea     SLEEP STUDY IN [place] , NO MACHINE YET  1 YEAR  . Seizures     INSULIN INDUCED  . ANEMIA-NOS   . NSTEMI (non-ST elevated myocardial infarction), 06/30/11 07/01/2011  . Stroke     93/2005/2006/2007;left sided weakness  . DEGENERATIVE JOINT DISEASE, SHOULDER     knee  . Gout     takes Uloric daily  . GERD     no meds required  . History of colon polyps   . HYPERTENSION     takes Norvasc,Catapress,Hydralazine,Imdur,and Metoprolol daily  . Insomnia     takes Ambien nightly  . Hyperlipidemia    Past Surgical History  Procedure Laterality Date  . Tonsillectomy    . Stomach surgery  2009    achalasia  . Knee surgery  01/07/2011    right  . Insertion of dialysis catheter  04/01/2011    Procedure: INSERTION OF DIALYSIS CATHETER;  Surgeon: Hinda Lenis, MD;  Location: [place];  Service: Vascular;  Laterality: N/A;  . Cardiac catheterization    . Av fistula placement  12/02/2011    Procedure: ARTERIOVENOUS (AV) FISTULA  CREATION;  Surgeon: Fransisco Hertz, MD;  Location: [facility] OR;  Service: Vascular;  Laterality: Left;  Ultrasound Guided  . Subclavian stent placement  12/20/2011  . Coronary artery bypass graft  04/01/2011    Procedure: CORONARY ARTERY BYPASS GRAFTING (CABG);  Surgeon: Kathlee Nations Suann Larry, MD;  Location: [facility] OR;  Service: Open Heart Surgery;  Laterality: N/A;  . Coronary angioplasty      06/30/11   AT [facility]  . Colonoscopy    . Hyperlipidemia    . Ligation of arteriovenous  fistula Left 06/08/2012    Procedure: LIGATION OF ARTERIOVENOUS  FISTULA;  Surgeon: Fransisco Hertz, MD;  Location: [facility] OR;  Service: Vascular;  Laterality: Left;  . Av fistula placement Left 06/08/2012    Procedure: ARTERIOVENOUS (AV) FISTULA CREATION;  Surgeon: Fransisco Hertz, MD;  Location: [facility] OR;  Service: Vascular;   Laterality: Left;   Family History  Problem Relation Age of Onset  . Hypertension Other   . Stroke Other   . Hyperlipidemia Other   . Cancer Mother   . Heart disease Mother   . Hyperlipidemia Mother   . Hypertension Mother   . Cancer Father   . Heart disease Father    Social History He quit chewing tobacco use about 10 years ago, but denies any use of other tobacco products, alcohol, or illicit.drugs.  He previously worked in Nurse, children's.  Allergies  Allergen Reactions  . Shrimp [Shellfish Allergy] Shortness Of Breath  . Atorvastatin Other (See Comments)    REACTION: weakness  . Insulins Other (See Comments)    Patient unsure as to the kind of insulin but states that it has previously caused seizures. This occurred in the hospital in 2006; but in most recent hospitalization (Jan 2013) received insulin but did not have reaction  . Simvastatin Other (See Comments)    REACTION: abnormal liver tests  . Ultram [Tramadol] Other (See Comments)    Liver enzyme    Prior to Admission medications   Medication Sig Start Date End Date Taking? Authorizing Provider  aspirin EC 81 MG EC tablet Take 1 tablet (81 mg total) by mouth daily. 07/07/11  Yes Dwana Melena, PA-C  cloNIDine (CATAPRES) 0.2 MG tablet Take 0.2 mg by mouth 2 (two) times daily.  05/13/11  Yes Corwin Levins, MD  clopidogrel (PLAVIX) 75 MG tablet Take 75 mg by mouth daily.   Yes Historical Provider, MD  febuxostat (ULORIC) 40 MG tablet Take 40 mg by mouth 2 (two) times daily. 05/13/11  Yes Corwin Levins, MD  fluocinonide cream (LIDEX) 0.05 % Apply 1 application topically 2 (two) times daily. 02/04/12  Yes Corwin Levins, MD  hydrALAZINE (APRESOLINE) 10 MG tablet Take 10 mg by mouth 2 (two) times daily.   Yes Historical Provider, MD  isosorbide mononitrate (IMDUR) 30 MG 24 hr tablet Take 1 tablet (30 mg total) by mouth daily. 06/24/13  Yes Leone Brand, NP  metoprolol (LOPRESSOR) 50 MG tablet Take 50 mg by mouth  2 (two) times daily.   Yes Historical Provider, MD  NITROSTAT 0.4 MG SL tablet PLACE 1 TABLET UNDER TONGUE AS NEEDED FOR CHEST PAIN EVERY 5 MINUTES (MAX 3DOSES)   Yes Runell Gess, MD  oxyCODONE (ROXICODONE) 5 MG immediate release tablet Take 1 tablet (5 mg total) by mouth every 4 (four) hours as needed for pain. 06/08/12  Yes Lars Mage, PA-C  polysaccharide iron (NIFEREX) 150 MG CAPS capsule Take 150  mg by mouth daily. 05/28/11  Yes Biagio Borg, MD  ranolazine (RANEXA) 500 MG 12 hr tablet Take 1 tablet (500 mg total) by mouth every morning. 07/19/13 07/19/14 Yes Lorretta Harp, MD  rosuvastatin (CRESTOR) 20 MG tablet Take 1 tablet (20 mg total) by mouth every evening. 12/22/12  Yes Lorretta Harp, MD  terazosin (HYTRIN) 10 MG capsule Take 10 mg by mouth at bedtime.   Yes Historical Provider, MD  ticagrelor (BRILINTA) 90 MG TABS tablet Take 90 mg by mouth daily.   Yes Historical Provider, MD  zolpidem (AMBIEN) 10 MG tablet Take 1 tablet (10 mg total) by mouth at bedtime as needed for sleep. 08/12/13  Yes Biagio Borg, MD   Labs:  Results for orders placed during the hospital encounter of 12/29/13 (from the past 48 hour(s))  TROPONIN I     Status: None   Collection Time    12/29/13  1:00 PM      Result Value Ref Range   Troponin I <0.30  <0.30 ng/mL   Comment:            Due to the release kinetics of cTnI,     a negative result within the first hours     of the onset of symptoms does not rule out     myocardial infarction with certainty.     If myocardial infarction is still suspected,     repeat the test at appropriate intervals.  ETHANOL     Status: None   Collection Time    12/29/13  1:24 PM      Result Value Ref Range   Alcohol, Ethyl (B) <11  0 - 11 mg/dL   Comment:            LOWEST DETECTABLE LIMIT FOR     SERUM ALCOHOL IS 11 mg/dL     FOR MEDICAL PURPOSES ONLY  CBC     Status: Abnormal   Collection Time    12/29/13  1:24 PM      Result Value Ref Range   WBC 19.7 (*)  4.0 - 10.5 K/uL   Comment: WHITE COUNT CONFIRMED ON SMEAR   RBC 3.52 (*) 4.22 - 5.81 MIL/uL   Hemoglobin 11.3 (*) 13.0 - 17.0 g/dL   HCT 33.1 (*) 39.0 - 52.0 %   MCV 94.0  78.0 - 100.0 fL   MCH 32.1  26.0 - 34.0 pg   MCHC 34.1  30.0 - 36.0 g/dL   RDW 14.0  11.5 - 15.5 %   Platelets    150 - 400 K/uL   Value: PLATELET CLUMPS NOTED ON SMEAR, COUNT APPEARS ADEQUATE  DIFFERENTIAL     Status: Abnormal   Collection Time    12/29/13  1:24 PM      Result Value Ref Range   Neutrophils Relative % 85 (*) 43 - 77 %   Lymphocytes Relative 6 (*) 12 - 46 %   Monocytes Relative 9  3 - 12 %   Eosinophils Relative 0  0 - 5 %   Basophils Relative 0  0 - 1 %   Neutro Abs 16.7 (*) 1.7 - 7.7 K/uL   Lymphs Abs 1.2  0.7 - 4.0 K/uL   Monocytes Absolute 1.8 (*) 0.1 - 1.0 K/uL   Eosinophils Absolute 0.0  0.0 - 0.7 K/uL   Basophils Absolute 0.0  0.0 - 0.1 K/uL   Smear Review       Value: PLATELET CLUMPS NOTED  ON SMEAR, COUNT APPEARS ADEQUATE  COMPREHENSIVE METABOLIC PANEL     Status: Abnormal   Collection Time    12/29/13  1:24 PM      Result Value Ref Range   Sodium 136 (*) 137 - 147 mEq/L   Potassium 4.5  3.7 - 5.3 mEq/L   Chloride 90 (*) 96 - 112 mEq/L   CO2 26  19 - 32 mEq/L   Glucose, Bld 166 (*) 70 - 99 mg/dL   BUN 23  6 - 23 mg/dL   Creatinine, Ser 6.72 (*) 0.50 - 1.35 mg/dL   Calcium 9.2  8.4 - 10.5 mg/dL   Total Protein 8.0  6.0 - 8.3 g/dL   Albumin 3.8  3.5 - 5.2 g/dL   AST 32  0 - 37 U/L   Comment: HEMOLYSIS AT THIS LEVEL MAY AFFECT RESULT   ALT 11  0 - 53 U/L   Alkaline Phosphatase 49  39 - 117 U/L   Total Bilirubin 0.9  0.3 - 1.2 mg/dL   GFR calc non Af Amer 8 (*) >90 mL/min   GFR calc Af Amer 10 (*) >90 mL/min   Comment: (NOTE)     The eGFR has been calculated using the CKD EPI equation.     This calculation has not been validated in all clinical situations.     eGFR's persistently <90 mL/min signify possible Chronic Kidney     Disease.   Anion gap 20 (*) 5 - 15  I-STAT  TROPOININ, ED     Status: Abnormal   Collection Time    12/29/13  1:31 PM      Result Value Ref Range   Troponin i, poc 0.14 (*) 0.00 - 0.08 ng/mL   Comment NOTIFIED PHYSICIAN     Comment 3            Comment: Due to the release kinetics of cTnI,     a negative result within the first hours     of the onset of symptoms does not rule out     myocardial infarction with certainty.     If myocardial infarction is still suspected,     repeat the test at appropriate intervals.  I-STAT CHEM 8, ED     Status: Abnormal   Collection Time    12/29/13  1:33 PM      Result Value Ref Range   Sodium 135 (*) 137 - 147 mEq/L   Potassium 4.4  3.7 - 5.3 mEq/L   Chloride 93 (*) 96 - 112 mEq/L   BUN 28 (*) 6 - 23 mg/dL   Creatinine, Ser 7.10 (*) 0.50 - 1.35 mg/dL   Glucose, Bld 172 (*) 70 - 99 mg/dL   Calcium, Ion 1.03 (*) 1.12 - 1.23 mmol/L   TCO2 28  0 - 100 mmol/L   Hemoglobin 12.6 (*) 13.0 - 17.0 g/dL   HCT 37.0 (*) 39.0 - 52.0 %  PROTIME-INR     Status: Abnormal   Collection Time    12/29/13  1:36 PM      Result Value Ref Range   Prothrombin Time 16.0 (*) 11.6 - 15.2 seconds   INR 1.27  0.00 - 1.49  APTT     Status: None   Collection Time    12/29/13  1:36 PM      Result Value Ref Range   aPTT 34  24 - 37 seconds  GLUCOSE, CAPILLARY     Status: Abnormal   Collection Time  12/29/13  1:48 PM      Result Value Ref Range   Glucose-Capillary 155 (*) 70 - 99 mg/dL   Comment 1 Notify RN    CBC     Status: Abnormal   Collection Time    12/30/13  5:30 AM      Result Value Ref Range   WBC 14.8 (*) 4.0 - 10.5 K/uL   Comment: REPEATED TO VERIFY   RBC 2.77 (*) 4.22 - 5.81 MIL/uL   Hemoglobin 8.7 (*) 13.0 - 17.0 g/dL   HCT 25.6 (*) 39.0 - 52.0 %   MCV 92.4  78.0 - 100.0 fL   MCH 31.4  26.0 - 34.0 pg   MCHC 34.0  30.0 - 36.0 g/dL   RDW 13.7  11.5 - 15.5 %   Platelets 207  150 - 400 K/uL  BASIC METABOLIC PANEL     Status: Abnormal   Collection Time    12/30/13  5:30 AM      Result  Value Ref Range   Sodium 134 (*) 137 - 147 mEq/L   Potassium 4.1  3.7 - 5.3 mEq/L   Chloride 92 (*) 96 - 112 mEq/L   CO2 28  19 - 32 mEq/L   Glucose, Bld 132 (*) 70 - 99 mg/dL   BUN 34 (*) 6 - 23 mg/dL   Creatinine, Ser 8.59 (*) 0.50 - 1.35 mg/dL   Calcium 8.8  8.4 - 10.5 mg/dL   GFR calc non Af Amer 6 (*) >90 mL/min   GFR calc Af Amer 7 (*) >90 mL/min   Comment: (NOTE)     The eGFR has been calculated using the CKD EPI equation.     This calculation has not been validated in all clinical situations.     eGFR's persistently <90 mL/min signify possible Chronic Kidney     Disease.   Anion gap 14  5 - 15  HEMOGLOBIN A1C     Status: None   Collection Time    12/30/13  5:30 AM      Result Value Ref Range   Hemoglobin A1C 5.6  <5.7 %   Comment: (NOTE)                                                                               According to the ADA Clinical Practice Recommendations for 2011, when     HbA1c is used as a screening test:      >=6.5%   Diagnostic of Diabetes Mellitus               (if abnormal result is confirmed)     5.7-6.4%   Increased risk of developing Diabetes Mellitus     References:Diagnosis and Classification of Diabetes Mellitus,Diabetes     ZOXW,9604,54(UJWJX 1):S62-S69 and Standards of Medical Care in             Diabetes - 2011,Diabetes Care,2011,34 (Suppl 1):S11-S61.   Mean Plasma Glucose 114  <117 mg/dL   Comment: Performed at [place]     Status: None   Collection Time    12/30/13  5:30 AM      Result Value Ref Range  Cholesterol 157  0 - 200 mg/dL   Triglycerides 223  <361 mg/dL   HDL 47  >22 mg/dL   Total CHOL/HDL Ratio 3.3     VLDL 21  0 - 40 mg/dL   LDL Cholesterol 89  0 - 99 mg/dL   Comment:            Total Cholesterol/HDL:CHD Risk     Coronary Heart Disease Risk Table                         Men   Women      1/2 Average Risk   3.4   3.3      Average Risk       5.0   4.4      2 X Average Risk   9.6   7.1      3  X Average Risk  23.4   11.0                Use the calculated Patient Ratio     above and the CHD Risk Table     to determine the patient's CHD Risk.                ATP III CLASSIFICATION (LDL):      <100     mg/dL   Optimal      449-753  mg/dL   Near or Above                        Optimal      130-159  mg/dL   Borderline      005-110  mg/dL   High      >211     mg/dL   Very High  URINE RAPID DRUG SCREEN (HOSP PERFORMED)     Status: Abnormal   Collection Time    12/30/13 10:03 AM      Result Value Ref Range   Opiates NONE DETECTED  NONE DETECTED   Cocaine NONE DETECTED  NONE DETECTED   Benzodiazepines POSITIVE (*) NONE DETECTED   Amphetamines NONE DETECTED  NONE DETECTED   Tetrahydrocannabinol NONE DETECTED  NONE DETECTED   Barbiturates NONE DETECTED  NONE DETECTED   Comment:            DRUG SCREEN FOR MEDICAL PURPOSES     ONLY.  IF CONFIRMATION IS NEEDED     FOR ANY PURPOSE, NOTIFY LAB     WITHIN 5 DAYS.                LOWEST DETECTABLE LIMITS     FOR URINE DRUG SCREEN     Drug Class       Cutoff (ng/mL)     Amphetamine      1000     Barbiturate      200     Benzodiazepine   200     Tricyclics       300     Opiates          300     Cocaine          300     THC              50  URINALYSIS, ROUTINE W REFLEX MICROSCOPIC     Status: Abnormal   Collection Time    12/30/13 10:03 AM      Result Value Ref Range   Color, Urine  YELLOW  YELLOW   APPearance HAZY (*) CLEAR   Specific Gravity, Urine 1.017  1.005 - 1.030   pH 8.5 (*) 5.0 - 8.0   Glucose, UA NEGATIVE  NEGATIVE mg/dL   Hgb urine dipstick SMALL (*) NEGATIVE   Bilirubin Urine NEGATIVE  NEGATIVE   Ketones, ur NEGATIVE  NEGATIVE mg/dL   Protein, ur >300 (*) NEGATIVE mg/dL   Urobilinogen, UA 0.2  0.0 - 1.0 mg/dL   Nitrite NEGATIVE  NEGATIVE   Leukocytes, UA MODERATE (*) NEGATIVE  URINE MICROSCOPIC-ADD ON     Status: Abnormal   Collection Time    12/30/13 10:03 AM      Result Value Ref Range   Squamous  Epithelial / LPF RARE  RARE   WBC, UA 21-50  <3 WBC/hpf   RBC / HPF 3-6  <3 RBC/hpf   Bacteria, UA FEW (*) RARE   Constitutional: negative for chills, fatigue, fevers and sweats Ears, nose, mouth, throat, and face: negative for earaches, hoarseness, nasal congestion and sore throat Respiratory: negative for cough, dyspnea on exertion, hemoptysis and sputum Cardiovascular: negative for chest pain, chest pressure/discomfort, dyspnea, orthopnea and palpitations Gastrointestinal: negative for abdominal pain, change in bowel habits, nausea and vomiting Genitourinary:negative, oliguric Musculoskeletal:negative for arthralgias, back pain, myalgias and neck pain Neurological: positive for coordination problems, gait problems and weakness, negative for dizziness, headaches, paresthesia and speech problems  Physical Exam: Filed Vitals:   12/30/13 1346  BP: 127/82  Pulse: 80  Temp: 99.2 F (37.3 C)  Resp: 20     General appearance: alert, cooperative and no distress Head: Normocephalic, without obvious abnormality, atraumatic Neck: no adenopathy, no carotid bruit, no JVD and supple, symmetrical, trachea midline Resp: clear to auscultation bilaterally Cardio: RRR with Gr II/VI systolic murmur, no rub GI: soft, non-tender; bowel sounds normal; no masses,  no organomegaly Extremities: extremities normal, atraumatic, no cyanosis or edema Neurologic: Speech fluent, no focal deficits Dialysis Access: AVF @ L upper arm with + bruit   Assessment/Plan: 1. Acute CVA - new infarct at head of caudate onn left per MRI; left-side weakness since previous CVA, seen by Dr. Krista Blue, on ASA & Plavix.  Per Neurology. 2. Fever - ? UTI per UA, urine & BCs pending, on Vancomycin & Zosyn. 3. ESRD - HD on TTS @ [place] via AVF, K 4.1.  HD tomorrow. 4. Hypertension/volume - BP 145/69 on Clonidine 0.1 mg bid, Metoprolol 25 mg bid; wt 99.3 kg, below EDW, lungs clear per CXR. 5. Anemia - Hgb down to 8.7, no Aranesp or  Fe. 6. Metabolic bone disease - Last corrected Ca 9.7, P 6.7,iPTH 409; Hectorol 2 mcg, [name] 1 g with meals. 7. Nutrition - Alb 4.2, renal diet, multivitamin. 8. CAD - s/p CABG x 4 in 03/2011 & PTCA in 06/2011.  LYLES,CHARLES 12/30/2013, 2:53 PM   Attending Nephrologist: Corliss Parish, MD  Patient seen and examined, agree with above note with above modifications.  He is currently in NAD- s/p new stroke- c/o hip pain from a fall.  Plan for HD in the AM via AVF- continue anticoagulants Corliss Parish, MD 12/30/2013

## 2013-12-30 NOTE — Evaluation (Signed)
Occupational Therapy Evaluation Patient Details Name: [name] MRN: [id number] DOB: 05/12/1960 Today's Date: 12/30/2013    History of Present Illness Patient is a 53 y/o male admitted with left sided weakness onset during dialysis with h/o ESRD on HE TTS, previous CVA, CABG, achalasia, sleep apnea, CKD.  MRI showed acute left caudate infarct.   Clinical Impression   Pt independent/at baseline with ADLs and Mod I with functional mobility. No vision or coordination impairments. No further acute OT indicated st this time, pt to continue with PT services for functional mobility safety    Follow Up Recommendations  No OT follow up    Equipment Recommendations  None recommended by OT    Recommendations for Other Services       Precautions / Restrictions Precautions Precautions: Fall Restrictions Weight Bearing Restrictions: No      Mobility Bed Mobility Overal bed mobility: Modified Independent                Transfers Overall transfer level: Modified independent Equipment used: Rolling walker (2 wheeled)             General transfer comment: pt moving at slower pace    Balance   Sitting-balance support: No upper extremity supported;Feet supported Sitting balance-Leahy Scale: Good     Standing balance support: No upper extremity supported;Single extremity supported;During functional activity Standing balance-Leahy Scale: Fair                              ADL Overall ADL's : At baseline;Independent                                       General ADL Comments: pt moving at slower pace     Vision  wears reading glasses                   Perception Perception Perception Tested?: No   Praxis Praxis Praxis tested?: Not tested    Pertinent Vitals/Pain Pain Assessment: 0-10 Pain Score: 7  Pain Descriptors / Indicators: Sore Pain Intervention(s): Monitored during session     Hand Dominance Right    Extremity/Trunk Assessment Upper Extremity Assessment Upper Extremity Assessment: Overall WFL for tasks assessed LUE Deficits / Details: limited shoulder flexion due to pain with fistula in that arm recent fistulagram; strength grossly 4-/5 shoulder flexion and 4/5 elbow flexion; grip and fine motor intact   Lower Extremity Assessment Lower Extremity Assessment: Defer to PT evaluation   Cervical / Trunk Assessment Cervical / Trunk Assessment: Normal   Communication Communication Communication: No difficulties   Cognition Arousal/Alertness: Awake/alert Behavior During Therapy: WFL for tasks assessed/performed;Flat affect Overall Cognitive Status: Within Functional Limits for tasks assessed                     General Comments   Pt pleasant and cooperative                 Home Living Family/patient expects to be discharged to:: Private residence Living Arrangements: Alone;Other relatives Available Help at Discharge: Family;Available PRN/intermittently Type of Home: House Home Access: Stairs to enter     Home Layout: One level     Bathroom Shower/Tub: Chief Strategy OfficerTub/shower unit   Bathroom Toilet: Standard     Home Equipment: Environmental consultantWalker - 2 wheels;Cane - single point;Bedside commode  Prior Functioning/Environment Level of Independence: Independent             OT Diagnosis: Acute pain   OT Problem List: Pain   OT Treatment/Interventions:      OT Goals(Current goals can be found in the care plan section) Acute Rehab OT Goals Patient Stated Goal: To return home  OT Frequency:     Barriers to D/C:  none                        End of Session Equipment Utilized During Treatment: Rolling walker  Activity Tolerance: Patient tolerated treatment well Patient left: with call bell/phone within reach;with family/visitor present   Time: 1040-4591 OT Time Calculation (min): 20 min Charges:  OT General Charges $OT Visit: 1 Procedure OT  Treatments $Therapeutic Activity: 8-22 mins G-Codes:    Galen Manila 12/30/2013, 2:28 PM

## 2013-12-31 DIAGNOSIS — I517 Cardiomegaly: Secondary | ICD-10-CM

## 2013-12-31 DIAGNOSIS — I10 Essential (primary) hypertension: Secondary | ICD-10-CM

## 2013-12-31 LAB — CBC
HCT: 23.7 % — ABNORMAL LOW (ref 39.0–52.0)
Hemoglobin: 8 g/dL — ABNORMAL LOW (ref 13.0–17.0)
MCH: 31.4 pg (ref 26.0–34.0)
MCHC: 33.8 g/dL (ref 30.0–36.0)
MCV: 92.9 fL (ref 78.0–100.0)
PLATELETS: 212 10*3/uL (ref 150–400)
RBC: 2.55 MIL/uL — ABNORMAL LOW (ref 4.22–5.81)
RDW: 13.4 % (ref 11.5–15.5)
WBC: 13.9 10*3/uL — AB (ref 4.0–10.5)

## 2013-12-31 LAB — RENAL FUNCTION PANEL
Albumin: 2.8 g/dL — ABNORMAL LOW (ref 3.5–5.2)
Anion gap: 18 — ABNORMAL HIGH (ref 5–15)
BUN: 53 mg/dL — AB (ref 6–23)
CALCIUM: 8.8 mg/dL (ref 8.4–10.5)
CHLORIDE: 87 meq/L — AB (ref 96–112)
CO2: 25 mEq/L (ref 19–32)
CREATININE: 11.33 mg/dL — AB (ref 0.50–1.35)
GFR calc Af Amer: 5 mL/min — ABNORMAL LOW (ref >90)
GFR, EST NON AFRICAN AMERICAN: 4 mL/min — AB (ref >90)
Glucose, Bld: 134 mg/dL — ABNORMAL HIGH (ref 70–99)
PHOSPHORUS: 5.9 mg/dL — AB (ref 2.3–4.6)
Potassium: 3.8 mEq/L (ref 3.7–5.3)
Sodium: 130 mEq/L — ABNORMAL LOW (ref 137–147)

## 2013-12-31 LAB — FERRITIN: Ferritin: 558 ng/mL — ABNORMAL HIGH (ref 22–322)

## 2013-12-31 MED ORDER — SODIUM CHLORIDE 0.9 % IV SOLN: 100.0000 mL | INTRAVENOUS | Status: DC | PRN

## 2013-12-31 MED ORDER — ALTEPLASE 2 MG IJ SOLR
2.0000 mg | Freq: Once | INTRAMUSCULAR | Status: AC | PRN
  Filled 2013-12-31: qty 2

## 2013-12-31 MED ORDER — DOXERCALCIFEROL 4 MCG/2ML IV SOLN
2.0000 ug | INTRAVENOUS | Status: DC
  Administered 2013-12-31: 2 ug via INTRAVENOUS
  Filled 2013-12-31: qty 2

## 2013-12-31 MED ORDER — DARBEPOETIN ALFA-POLYSORBATE 100 MCG/0.5ML IJ SOLN
100.0000 ug | INTRAMUSCULAR | Status: DC
  Administered 2013-12-31: 100 ug via INTRAVENOUS
  Filled 2013-12-31: qty 0.5

## 2013-12-31 MED ORDER — LANTHANUM CARBONATE 500 MG PO CHEW
1000.0000 mg | CHEWABLE_TABLET | Freq: Three times a day (TID) | ORAL | Status: DC
  Administered 2013-12-31 – 2014-01-01 (×2): 1000 mg via ORAL
  Filled 2013-12-31 (×2): qty 2

## 2013-12-31 MED ORDER — PENTAFLUOROPROP-TETRAFLUOROETH EX AERO: 1.0000 "application " | INHALATION_SPRAY | CUTANEOUS | Status: DC | PRN

## 2013-12-31 MED ORDER — DOXERCALCIFEROL 4 MCG/2ML IV SOLN
INTRAVENOUS | Status: AC
  Administered 2013-12-31: 2 ug via INTRAVENOUS
  Filled 2013-12-31: qty 2

## 2013-12-31 MED ORDER — DARBEPOETIN ALFA-POLYSORBATE 100 MCG/0.5ML IJ SOLN
INTRAMUSCULAR | Status: AC
  Administered 2013-12-31: 100 ug via INTRAVENOUS
  Filled 2013-12-31: qty 0.5

## 2013-12-31 MED ORDER — LIDOCAINE-PRILOCAINE 2.5-2.5 % EX CREA
1.0000 "application " | TOPICAL_CREAM | CUTANEOUS | Status: DC | PRN
  Filled 2013-12-31: qty 5

## 2013-12-31 MED ORDER — NEPRO/CARBSTEADY PO LIQD: 237.0000 mL | ORAL | Status: DC | PRN

## 2013-12-31 MED ORDER — LIDOCAINE HCL (PF) 1 % IJ SOLN: 5.0000 mL | INTRAMUSCULAR | Status: DC | PRN

## 2013-12-31 MED ORDER — HEPARIN SODIUM (PORCINE) 1000 UNIT/ML DIALYSIS
20.0000 [IU]/kg | INTRAMUSCULAR | Status: DC | PRN
  Administered 2013-12-31: 2000 [IU] via INTRAVENOUS_CENTRAL
  Filled 2013-12-31: qty 2

## 2013-12-31 MED ORDER — HEPARIN SODIUM (PORCINE) 1000 UNIT/ML DIALYSIS
1000.0000 [IU] | INTRAMUSCULAR | Status: DC | PRN
  Filled 2013-12-31: qty 1

## 2013-12-31 NOTE — Progress Notes (Signed)
Subjective:  No new complaints Objective Vital signs in last 24 hours: Filed Vitals:   12/30/13 1527 12/30/13 1720 12/30/13 2223 12/31/13 0246  BP: 145/69 130/69 122/62 146/74  Pulse: 88 75 75 69  Temp: 99.3 F (37.4 C) 98.8 F (37.1 C) 99.8 F (37.7 C) 99.3 F (37.4 C)  TempSrc: Oral Oral Oral Oral  Resp: 18 20 20 18   Height:      Weight:      SpO2: 100% 98% 100% 99%   Weight change:   Intake/Output Summary (Last 24 hours) at 12/31/13 0828 Last data filed at 12/31/13 0825  Gross per 24 hour  Intake    200 ml  Output    175 ml  Net     25 ml    Dialysis Orders: TTS @ [facility]  4 hrs 103 kg 2K/2.25Ca 325/800 Heparin 3500 U AVF @ LFA  Hectorol 2 mcg No Aranesp or Venofer   Assessment/Plan:  1. Acute CVA - new infarct at head of caudate on left per MRI; left-side weakness since previous CVA, seen by Dr. Terrace Arabia, on ASA & Plavix. Per Neurology. 2. Fever - ? UTI per UA, urine & BCs pending, on Vancomycin & Zosyn. No symptoms and really does not make urine but WBC trending down 3. ESRD - HD on TTS @ [facility] via AVF, K 4.1. HD today 4. Hypertension/volume - BP 145/69 on Clonidine 0.1 mg bid, Metoprolol 25 mg bid; wt 99.3 kg, below EDW, lungs clear per CXR.  Not sure he needs clonidine?  5. Anemia - Hgb down to 8.0, need to add ESA and check iron 6. Metabolic bone disease - Last corrected Ca 9.7, P 6.7,iPTH 409; Hectorol 2 mcg, [name] 1 g with meals. 7. Nutrition - Alb 4.2, renal diet, multivitamin. 8. CAD - s/p CABG x 4 in 03/2011 & PTCA in 06/2011.     Janylah Belgrave A    Labs: Basic Metabolic Panel:  Recent Labs Lab 12/29/13 1324 12/29/13 1333 12/30/13 0530  NA 136* 135* 134*  K 4.5 4.4 4.1  CL 90* 93* 92*  CO2 26  --  28  GLUCOSE 166* 172* 132*  BUN 23 28* 34*  CREATININE 6.72* 7.10* 8.59*  CALCIUM 9.2  --  8.8   Liver Function Tests:  Recent Labs Lab 12/29/13 1324  AST 32  ALT 11  ALKPHOS 49  BILITOT 0.9  PROT 8.0  ALBUMIN 3.8   No results found  for this basename: LIPASE, AMYLASE,  in the last 168 hours No results found for this basename: AMMONIA,  in the last 168 hours CBC:  Recent Labs Lab 12/29/13 1324 12/29/13 1333 12/30/13 0530 12/31/13 0415  WBC 19.7*  --  14.8* 13.9*  NEUTROABS 16.7*  --   --   --   HGB 11.3* 12.6* 8.7* 8.0*  HCT 33.1* 37.0* 25.6* 23.7*  MCV 94.0  --  92.4 92.9  PLT PLATELET CLUMPS NOTED ON SMEAR, COUNT APPEARS ADEQUATE  --  207 212   Cardiac Enzymes:  Recent Labs Lab 12/29/13 1300  TROPONINI <0.30   CBG:  Recent Labs Lab 12/29/13 1348  GLUCAP 155*    Iron Studies: No results found for this basename: IRON, TIBC, TRANSFERRIN, FERRITIN,  in the last 72 hours Studies/Results: Dg Chest 2 View  12/29/2013   CLINICAL DATA:  Leg weakness.  EXAM: CHEST  2 VIEW  COMPARISON:  June 08, 2012.  FINDINGS: The heart size and mediastinal contours are within normal limits. Status post coronary  artery bypass graft. No pneumothorax or pleural effusion is noted. Both lungs are clear. The visualized skeletal structures are unremarkable.  IMPRESSION: No acute cardiopulmonary abnormality seen.   Electronically Signed   By: Roque Lias M.D.   On: 12/29/2013 16:51   Ct Head Wo Contrast  12/29/2013   CLINICAL DATA:  Code stroke, left facial droop  EXAM: CT HEAD WITHOUT CONTRAST  TECHNIQUE: Contiguous axial images were obtained from the base of the skull through the vertex without intravenous contrast.  COMPARISON:  MRI brain 07/06/2012  FINDINGS: There is no evidence of mass effect, midline shift, or extra-axial fluid collections. There is no evidence of a space-occupying lesion or intracranial hemorrhage. There is no evidence of a cortical-based area of acute infarction. There is an old left basal ganglia lacunar infarct. There is an old cortical infarct of the right parietal lobe. There is generalized cerebral atrophy. There is periventricular white matter low attenuation likely secondary to microangiopathy.  The  ventricles and sulci are appropriate for the patient's age. The basal cisterns are patent.  Visualized portions of the orbits are unremarkable. The visualized portions of the paranasal sinuses and mastoid air cells are unremarkable. Cerebrovascular atherosclerotic calcifications are noted.  The osseous structures are unremarkable.  IMPRESSION: No acute intracranial pathology.  These results were called by telephone at the time of interpretation on 12/29/2013 at 1:56 pm to Dr. Amada Jupiter, who verbally acknowledged these results.   Electronically Signed   By: Elige Ko   On: 12/29/2013 13:56   Mr Maxine Glenn Head Wo Contrast  12/29/2013   CLINICAL DATA:  Left leg weakness and left facial droop.  Stroke.  EXAM: MRI HEAD WITHOUT CONTRAST  MRA HEAD WITHOUT CONTRAST  TECHNIQUE: Multiplanar, multiecho pulse sequences of the brain and surrounding structures were obtained without intravenous contrast. Angiographic images of the head were obtained using MRA technique without contrast.  COMPARISON:  MRI 07/06/2012  FINDINGS: MRI HEAD FINDINGS  Acute infarct in the head of the caudate on the left measuring approximately 15 mm. Adjacent to this there is an area of chronic hemorrhagic infarct which was present previously involving the internal capsule and putamen.  Chronic microvascular ischemic change in the white matter and thalamus. Chronic lacune in the left paracentral pons. Chronic small infarct in the right parietal lobe is unchanged.  Negative for mass or edema. No hydrocephalus or shift of the midline structures.  MRA HEAD FINDINGS  Image quality degraded by significant artifact from motion as well as decreased signal in the circle of Willis diffusely which may be due to patient position or severe atherosclerotic disease.  Both vertebral arteries are patent. The basilar is patent. Focal severe stenosis in the mid left posterior cerebral artery unchanged from 2007.  Internal carotid artery is patent bilaterally with a  moderate to severe stenosis in the right cavernous carotid. Left cavernous carotid patent. Decreased signal in the anterior and middle cerebral arteries bilaterally which is at least partially due to advanced atherosclerotic disease which may have progressed since 2007. Some this could be due to artifact.  IMPRESSION: Acute infarct head of the caudate on the left.  Moderate chronic ischemic changes.  Severe intracranial atherosclerotic disease. There is decreased signal in the anterior and middle cerebral arteries bilaterally felt to be due to combination of advanced atherosclerotic disease and and a degree of artifact due to technique.   Electronically Signed   By: Marlan Palau M.D.   On: 12/29/2013 20:07   Mr Brain Wo Contrast  12/29/2013   CLINICAL DATA:  Left leg weakness and left facial droop.  Stroke.  EXAM: MRI HEAD WITHOUT CONTRAST  MRA HEAD WITHOUT CONTRAST  TECHNIQUE: Multiplanar, multiecho pulse sequences of the brain and surrounding structures were obtained without intravenous contrast. Angiographic images of the head were obtained using MRA technique without contrast.  COMPARISON:  MRI 07/06/2012  FINDINGS: MRI HEAD FINDINGS  Acute infarct in the head of the caudate on the left measuring approximately 15 mm. Adjacent to this there is an area of chronic hemorrhagic infarct which was present previously involving the internal capsule and putamen.  Chronic microvascular ischemic change in the white matter and thalamus. Chronic lacune in the left paracentral pons. Chronic small infarct in the right parietal lobe is unchanged.  Negative for mass or edema. No hydrocephalus or shift of the midline structures.  MRA HEAD FINDINGS  Image quality degraded by significant artifact from motion as well as decreased signal in the circle of Willis diffusely which may be due to patient position or severe atherosclerotic disease.  Both vertebral arteries are patent. The basilar is patent. Focal severe stenosis in the  mid left posterior cerebral artery unchanged from 2007.  Internal carotid artery is patent bilaterally with a moderate to severe stenosis in the right cavernous carotid. Left cavernous carotid patent. Decreased signal in the anterior and middle cerebral arteries bilaterally which is at least partially due to advanced atherosclerotic disease which may have progressed since 2007. Some this could be due to artifact.  IMPRESSION: Acute infarct head of the caudate on the left.  Moderate chronic ischemic changes.  Severe intracranial atherosclerotic disease. There is decreased signal in the anterior and middle cerebral arteries bilaterally felt to be due to combination of advanced atherosclerotic disease and and a degree of artifact due to technique.   Electronically Signed   By: Marlan Palauharles  Clark M.D.   On: 12/29/2013 20:07   Medications: Infusions:    Scheduled Medications: . aspirin EC  81 mg Oral Daily  . cloNIDine  0.1 mg Oral BID  . clopidogrel  75 mg Oral Daily  . febuxostat  40 mg Oral BID  . fluocinonide cream  1 application Topical BID  . heparin  5,000 Units Subcutaneous 3 times per day  . iron polysaccharides  150 mg Oral Daily  . metoprolol tartrate  25 mg Oral BID  . piperacillin-tazobactam (ZOSYN)  IV  2.25 g Intravenous 3 times per day  . piperacillin-tazobactam  3.375 g Intravenous Once  . ranolazine  500 mg Oral q morning - 10a  . rosuvastatin  20 mg Oral QPM  . vancomycin  1,000 mg Intravenous Q T,Th,Sa-HD    have reviewed scheduled and prn medications.  Physical Exam: General: bathing- NAD Heart: RRR Lungs: mostly clear Abdomen: soft, non tender Extremities: no obvious edema Dialysis Access: left AVF patent    12/31/2013,8:28 AM  LOS: 2 days

## 2013-12-31 NOTE — Progress Notes (Signed)
Patient still in dialysis .

## 2013-12-31 NOTE — Progress Notes (Signed)
PT Cancellation Note  Patient Details Name: [name] MRN: [id number] DOB: 08/17/1960   Cancelled Treatment:    Reason Eval/Treat Not Completed: Patient at procedure or test/unavailable (Dialysis)   Thomasene MohairAllen, Ferd Horrigan L 12/31/2013, 4:35 PM

## 2013-12-31 NOTE — Progress Notes (Signed)
[facility] PROGRESS NOTE  [name] ZOX:[id number] DOB: 1960/08/05 DOA: 12/29/2013 PCP: Oliver Barre, MD  Assessment/Plan: 53 y/o y/o with PMH of HTN, CAD s/p CABG, HPL, ESRD dialysis TTS, prior CVAs, presents to ED with Left facial droop and left sided weakness while on HD and found to have Acute CVA  1. Acute CVA; symptoms resolving; h/o CVAs -MRI: Acute infarct head of the caudate on the left, severe intracranial atherosclerosis;  -[place]: Preliminary report: Bilateral: 1-39% ICA stenosis. Vertebral artery flow is antegrade. Pend echo, PT -Pt is on ASA and plavix, he quit taking brilinta last month, cont statin   2. SIRS/sepsis with probable UTI; pend blood cultures -cont empiric atx, leukocytosis improved; febrile: 10/15; afebrile; >24 hrs  3. CAD s/p CABG; patient denies any chest pain; on medical management; cont current regimen  4. ESRD on HD; TTS; cont per nephrology; d/c IVF 5. HTN, permissive HTN; decreased clonidine, resume BB, hold terazosin, hydralazine; resume/titartye meds as needed  6. Anemia, chronic likely renal; no obvious s/s of acute bleeding; Tf with HD prn   Code Status: full Family Communication:  D/w patient (indicate person spoken with, relationship, and if by phone, the number) Disposition Plan: home 24-48 hrs    Consultants:  neuro  Procedures:  none  Antibiotics:  Zosyn  (indicate start date, and stop date if known)  HPI/Subjective: alert  Objective: Filed Vitals:   12/31/13 0932  BP: 133/80  Pulse: 70  Temp: 98.6 F (37 C)  Resp: 18    Intake/Output Summary (Last 24 hours) at 12/31/13 1227 Last data filed at 12/31/13 0900  Gross per 24 hour  Intake    200 ml  Output    275 ml  Net    -75 ml   Filed Weights   12/29/13 1339  Weight: 99.338 kg (219 lb)    Exam:   General:  alert  Cardiovascular: s1,s2 rrr  Respiratory: CTA BL  Abdomen: soft, nt,nd   Musculoskeletal: no LE edema   Data Reviewed: Basic  Metabolic Panel:  Recent Labs Lab 12/29/13 1324 12/29/13 1333 12/30/13 0530  NA 136* 135* 134*  K 4.5 4.4 4.1  CL 90* 93* 92*  CO2 26  --  28  GLUCOSE 166* 172* 132*  BUN 23 28* 34*  CREATININE 6.72* 7.10* 8.59*  CALCIUM 9.2  --  8.8   Liver Function Tests:  Recent Labs Lab 12/29/13 1324  AST 32  ALT 11  ALKPHOS 49  BILITOT 0.9  PROT 8.0  ALBUMIN 3.8   No results found for this basename: LIPASE, AMYLASE,  in the last 168 hours No results found for this basename: AMMONIA,  in the last 168 hours CBC:  Recent Labs Lab 12/29/13 1324 12/29/13 1333 12/30/13 0530 12/31/13 0415  WBC 19.7*  --  14.8* 13.9*  NEUTROABS 16.7*  --   --   --   HGB 11.3* 12.6* 8.7* 8.0*  HCT 33.1* 37.0* 25.6* 23.7*  MCV 94.0  --  92.4 92.9  PLT PLATELET CLUMPS NOTED ON SMEAR, COUNT APPEARS ADEQUATE  --  207 212   Cardiac Enzymes:  Recent Labs Lab 12/29/13 1300  TROPONINI <0.30   BNP (last 3 results) No results found for this basename: PROBNP,  in the last 8760 hours CBG:  Recent Labs Lab 12/29/13 1348  GLUCAP 155*    Recent Results (from the past 240 hour(s))  MRSA PCR SCREENING     Status: None   Collection Time  12/30/13  4:36 PM      Result Value Ref Range Status   MRSA by PCR NEGATIVE  NEGATIVE Final   Comment:            The GeneXpert MRSA Assay (FDA     approved for NASAL specimens     only), is one component of a     comprehensive MRSA colonization     surveillance program. It is not     intended to diagnose MRSA     infection nor to guide or     monitor treatment for     MRSA infections.     Studies: Dg Chest 2 View  12/29/2013   CLINICAL DATA:  Leg weakness.  EXAM: CHEST  2 VIEW  COMPARISON:  June 08, 2012.  FINDINGS: The heart size and mediastinal contours are within normal limits. Status post coronary artery bypass graft. No pneumothorax or pleural effusion is noted. Both lungs are clear. The visualized skeletal structures are unremarkable.   IMPRESSION: No acute cardiopulmonary abnormality seen.   Electronically Signed   By: Roque LiasJames  Green M.D.   On: 12/29/2013 16:51   Ct Head Wo Contrast  12/29/2013   CLINICAL DATA:  Code stroke, left facial droop  EXAM: CT HEAD WITHOUT CONTRAST  TECHNIQUE: Contiguous axial images were obtained from the base of the skull through the vertex without intravenous contrast.  COMPARISON:  MRI brain 07/06/2012  FINDINGS: There is no evidence of mass effect, midline shift, or extra-axial fluid collections. There is no evidence of a space-occupying lesion or intracranial hemorrhage. There is no evidence of a cortical-based area of acute infarction. There is an old left basal ganglia lacunar infarct. There is an old cortical infarct of the right parietal lobe. There is generalized cerebral atrophy. There is periventricular white matter low attenuation likely secondary to microangiopathy.  The ventricles and sulci are appropriate for the patient's age. The basal cisterns are patent.  Visualized portions of the orbits are unremarkable. The visualized portions of the paranasal sinuses and mastoid air cells are unremarkable. Cerebrovascular atherosclerotic calcifications are noted.  The osseous structures are unremarkable.  IMPRESSION: No acute intracranial pathology.  These results were called by telephone at the time of interpretation on 12/29/2013 at 1:56 pm to Dr. Amada JupiterKIRKPATRICK, who verbally acknowledged these results.   Electronically Signed   By: Elige KoHetal  Patel   On: 12/29/2013 13:56   Mr Maxine GlennMra Head Wo Contrast  12/29/2013   CLINICAL DATA:  Left leg weakness and left facial droop.  Stroke.  EXAM: MRI HEAD WITHOUT CONTRAST  MRA HEAD WITHOUT CONTRAST  TECHNIQUE: Multiplanar, multiecho pulse sequences of the brain and surrounding structures were obtained without intravenous contrast. Angiographic images of the head were obtained using MRA technique without contrast.  COMPARISON:  MRI 07/06/2012  FINDINGS: MRI HEAD FINDINGS  Acute  infarct in the head of the caudate on the left measuring approximately 15 mm. Adjacent to this there is an area of chronic hemorrhagic infarct which was present previously involving the internal capsule and putamen.  Chronic microvascular ischemic change in the white matter and thalamus. Chronic lacune in the left paracentral pons. Chronic small infarct in the right parietal lobe is unchanged.  Negative for mass or edema. No hydrocephalus or shift of the midline structures.  MRA HEAD FINDINGS  Image quality degraded by significant artifact from motion as well as decreased signal in the circle of Willis diffusely which may be due to patient position or severe atherosclerotic disease.  Both vertebral arteries are patent. The basilar is patent. Focal severe stenosis in the mid left posterior cerebral artery unchanged from 2007.  Internal carotid artery is patent bilaterally with a moderate to severe stenosis in the right cavernous carotid. Left cavernous carotid patent. Decreased signal in the anterior and middle cerebral arteries bilaterally which is at least partially due to advanced atherosclerotic disease which may have progressed since 2007. Some this could be due to artifact.  IMPRESSION: Acute infarct head of the caudate on the left.  Moderate chronic ischemic changes.  Severe intracranial atherosclerotic disease. There is decreased signal in the anterior and middle cerebral arteries bilaterally felt to be due to combination of advanced atherosclerotic disease and and a degree of artifact due to technique.   Electronically Signed   By: Marlan Palau M.D.   On: 12/29/2013 20:07   Mr Brain Wo Contrast  12/29/2013   CLINICAL DATA:  Left leg weakness and left facial droop.  Stroke.  EXAM: MRI HEAD WITHOUT CONTRAST  MRA HEAD WITHOUT CONTRAST  TECHNIQUE: Multiplanar, multiecho pulse sequences of the brain and surrounding structures were obtained without intravenous contrast. Angiographic images of the head were  obtained using MRA technique without contrast.  COMPARISON:  MRI 07/06/2012  FINDINGS: MRI HEAD FINDINGS  Acute infarct in the head of the caudate on the left measuring approximately 15 mm. Adjacent to this there is an area of chronic hemorrhagic infarct which was present previously involving the internal capsule and putamen.  Chronic microvascular ischemic change in the white matter and thalamus. Chronic lacune in the left paracentral pons. Chronic small infarct in the right parietal lobe is unchanged.  Negative for mass or edema. No hydrocephalus or shift of the midline structures.  MRA HEAD FINDINGS  Image quality degraded by significant artifact from motion as well as decreased signal in the circle of Willis diffusely which may be due to patient position or severe atherosclerotic disease.  Both vertebral arteries are patent. The basilar is patent. Focal severe stenosis in the mid left posterior cerebral artery unchanged from 2007.  Internal carotid artery is patent bilaterally with a moderate to severe stenosis in the right cavernous carotid. Left cavernous carotid patent. Decreased signal in the anterior and middle cerebral arteries bilaterally which is at least partially due to advanced atherosclerotic disease which may have progressed since 2007. Some this could be due to artifact.  IMPRESSION: Acute infarct head of the caudate on the left.  Moderate chronic ischemic changes.  Severe intracranial atherosclerotic disease. There is decreased signal in the anterior and middle cerebral arteries bilaterally felt to be due to combination of advanced atherosclerotic disease and and a degree of artifact due to technique.   Electronically Signed   By: Marlan Palau M.D.   On: 12/29/2013 20:07    Scheduled Meds: . aspirin EC  81 mg Oral Daily  . cloNIDine  0.1 mg Oral BID  . clopidogrel  75 mg Oral Daily  . darbepoetin (ARANESP) injection - DIALYSIS  100 mcg Intravenous Q Sat-HD  . doxercalciferol  2 mcg  Intravenous Q T,Th,Sa-HD  . febuxostat  40 mg Oral BID  . fluocinonide cream  1 application Topical BID  . heparin  5,000 Units Subcutaneous 3 times per day  . iron polysaccharides  150 mg Oral Daily  . lanthanum  1,000 mg Oral TID WC  . metoprolol tartrate  25 mg Oral BID  . piperacillin-tazobactam (ZOSYN)  IV  2.25 g Intravenous 3 times per day  .  piperacillin-tazobactam  3.375 g Intravenous Once  . ranolazine  500 mg Oral q morning - 10a  . rosuvastatin  20 mg Oral QPM  . vancomycin  1,000 mg Intravenous Q T,Th,Sa-HD   Continuous Infusions:    Principal Problem:   Acute ischemic left ACA stroke Active Problems:   Hypertension, accelerated   ESRD on dialysis   CVD (cerebrovascular disease)   SIRS (systemic inflammatory response syndrome)    Time spent: >35 minutes     Esperanza Sheets  [facility] Pager [phone]. If 7PM-7AM, please contact night-coverage at [email or web address], password [email or web address] 12/31/2013, 12:27 PM  LOS: 2 days

## 2013-12-31 NOTE — Progress Notes (Signed)
Patient back from dialysis, alert and denies any complaint at this time.

## 2013-12-31 NOTE — Progress Notes (Signed)
Echocardiogram 2D Echocardiogram has been performed.  [name] 12/31/2013, 1:28 PM

## 2014-01-01 DIAGNOSIS — R509 Fever, unspecified: Secondary | ICD-10-CM

## 2014-01-01 LAB — IRON AND TIBC
Iron: 10 ug/dL — ABNORMAL LOW (ref 42–135)
SATURATION RATIOS: 6 % — AB (ref 20–55)
TIBC: 156 ug/dL — AB (ref 215–435)
UIBC: 146 ug/dL (ref 125–400)

## 2014-01-01 MED ORDER — TERAZOSIN HCL 5 MG PO CAPS: 5.0000 mg | ORAL_CAPSULE | Freq: Every day | ORAL | Status: DC

## 2014-01-01 MED ORDER — SODIUM CHLORIDE 0.9 % IV SOLN
125.0000 mg | Freq: Once | INTRAVENOUS | Status: AC
  Administered 2014-01-01: 125 mg via INTRAVENOUS
  Filled 2014-01-01: qty 10

## 2014-01-01 MED ORDER — LEVOFLOXACIN 250 MG PO TABS: 250.0000 mg | ORAL_TABLET | ORAL | Status: DC

## 2014-01-01 NOTE — Progress Notes (Signed)
Physical Therapy Treatment Patient Details Name: [name] MRN: [id number] DOB: 1960-07-01 Today's Date: 01/01/2014    History of Present Illness Patient is a 53 y/o male admitted with left sided weakness onset during dialysis with h/o ESRD on HE TTS, previous CVA, CABG, achalasia, sleep apnea, CKD.  MRI showed acute left caudate infarct.    PT Comments    Good progress with mobility, has RW and cane at home and verbalized when to use them appropriately; OK for dc home from PT standpoint   Follow Up Recommendations  No PT follow up     Equipment Recommendations  None recommended by PT    Recommendations for Other Services       Precautions / Restrictions Precautions Precautions: Fall Precaution Comments: due to decreased gait velocity, left side weakness; Fall risk greatly reduced with use of RW    Mobility  Bed Mobility Overal bed mobility: Modified Independent                Transfers Overall transfer level: Modified independent Equipment used: Rolling walker (2 wheeled)             General transfer comment: pt moving at slower pace  Ambulation/Gait Ambulation/Gait assistance: Supervision;Modified independent (Device/Increase time) Ambulation Distance (Feet): 200 Feet Assistive device: Rolling walker (2 wheeled);None (and pushing IV pole) Gait Pattern/deviations: Step-through pattern Gait velocity: decr   General Gait Details: no left foot drag, step length equal but significantly decreased, slow gait; Used rW second half of walk due to fatigue; no overt loss of balance   Stairs Stairs: Yes Stairs assistance: Min guard Stair Management: No rails;Forwards;Alternating pattern Number of Stairs: 3 General stair comments: Overall managing well  Wheelchair Mobility    Modified Rankin (Stroke Patients Only) Modified Rankin (Stroke Patients Only) Pre-Morbid Rankin Score: No symptoms Modified Rankin: Moderate disability     Balance                                    Cognition Arousal/Alertness: Awake/alert Behavior During Therapy: WFL for tasks assessed/performed;Flat affect Overall Cognitive Status: Within Functional Limits for tasks assessed                      Exercises      General Comments        Pertinent Vitals/Pain Pain Assessment: No/denies pain    Home Living                      Prior Function            PT Goals (current goals can now be found in the care plan section) Acute Rehab PT Goals Patient Stated Goal: To return home PT Goal Formulation: With patient Time For Goal Achievement: 01/06/14 Potential to Achieve Goals: Good Progress towards PT goals: Progressing toward goals    Frequency  Min 4X/week    PT Plan Current plan remains appropriate    Co-evaluation             End of Session   Activity Tolerance: Patient tolerated treatment well Patient left: in chair;with call bell/phone within reach     Time: 1158-1221 PT Time Calculation (min): 23 min  Charges:  $Gait Training: 23-37 mins                    G Codes:      Gregory Clines  Glenn 01/01/2014, 1:36 PM  Gregory ClinesHolly Almendra Glenn, PT  Acute Rehabilitation Services Pager [phone] Office [phone]

## 2014-01-01 NOTE — Progress Notes (Signed)
Subjective:  HD last night removed 2 liters- no new complaints- watching TV Objective Vital signs in last 24 hours: Filed Vitals:   12/31/13 2100 01/01/14 0100 01/01/14 0528 01/01/14 0623  BP: 145/67 142/69 185/97 140/87  Pulse: 100 98 87 80  Temp: 100 F (37.8 C) 99.4 F (37.4 C) 98.8 F (37.1 C)   TempSrc: Oral Oral Oral   Resp: 16 16 16 16   Height:      Weight:      SpO2: 98% 99% 99% 100%   Weight change:   Intake/Output Summary (Last 24 hours) at 01/01/14 0823 Last data filed at 01/01/14 0758  Gross per 24 hour  Intake    660 ml  Output   2100 ml  Net  -1440 ml    Dialysis Orders: TTS @ [facility]  4 hrs 103 kg 2K/2.25Ca 325/800 Heparin 3500 U AVF @ LFA  Hectorol 2 mcg No Aranesp or Venofer   Assessment/Plan:  1. Acute CVA - new infarct at head of caudate on left per MRI; left-side weakness since previous CVA, seen by Dr. Terrace Arabia, on ASA & Plavix. Per Neurology. 2. Fever - ? UTI per UA, urine & BCs pending, on Vancomycin & Zosyn. No symptoms and really does not make urine but WBC trending down- consider change to oral agent 3. ESRD - HD on TTS @ [facility] via AVF, K 4.1. HD yest- plan for next on Tuesday but not sure will be here that long  4. Hypertension/volume - BP 145/69 on Clonidine 0.1 mg bid, Metoprolol 25 mg bid; wt 99.3 kg, below EDW, lungs clear per CXR.  Not sure he needs clonidine? EDW may need to be lowered ? 5. Anemia - Hgb down to 8.0, need to add ESA and iron sat low ferr 500's will give IV iron while here and arrange for course as OP  6. Metabolic bone disease - Last calc 8.8, P 5.9, iPTH 409; Hectorol 2 mcg, [name] 1 g with meals. 7. Nutrition - Alb 4.2 but has dropped dramatically while here, renal diet, multivitamin. 8. CAD - s/p CABG x 4 in 03/2011 & PTCA in 06/2011. 9. Dispo- says he feels a lot better today , possible discharge ?      Adleigh Mcmasters A    Labs: Basic Metabolic Panel:  Recent Labs Lab 12/29/13 1324 12/29/13 1333 12/30/13 0530  12/31/13 1238  NA 136* 135* 134* 130*  K 4.5 4.4 4.1 3.8  CL 90* 93* 92* 87*  CO2 26  --  28 25  GLUCOSE 166* 172* 132* 134*  BUN 23 28* 34* 53*  CREATININE 6.72* 7.10* 8.59* 11.33*  CALCIUM 9.2  --  8.8 8.8  PHOS  --   --   --  5.9*   Liver Function Tests:  Recent Labs Lab 12/29/13 1324 12/31/13 1238  AST 32  --   ALT 11  --   ALKPHOS 49  --   BILITOT 0.9  --   PROT 8.0  --   ALBUMIN 3.8 2.8*   No results found for this basename: LIPASE, AMYLASE,  in the last 168 hours No results found for this basename: AMMONIA,  in the last 168 hours CBC:  Recent Labs Lab 12/29/13 1324 12/29/13 1333 12/30/13 0530 12/31/13 0415  WBC 19.7*  --  14.8* 13.9*  NEUTROABS 16.7*  --   --   --   HGB 11.3* 12.6* 8.7* 8.0*  HCT 33.1* 37.0* 25.6* 23.7*  MCV 94.0  --  92.4 92.9  PLT PLATELET CLUMPS NOTED ON SMEAR, COUNT APPEARS ADEQUATE  --  207 212   Cardiac Enzymes:  Recent Labs Lab 12/29/13 1300  TROPONINI <0.30   CBG:  Recent Labs Lab 12/29/13 1348  GLUCAP 155*    Iron Studies:   Recent Labs  12/31/13 1300  IRON 10*  TIBC 156*  FERRITIN 558*   Studies/Results: No results found. Medications: Infusions:    Scheduled Medications: . aspirin EC  81 mg Oral Daily  . cloNIDine  0.1 mg Oral BID  . clopidogrel  75 mg Oral Daily  . darbepoetin (ARANESP) injection - DIALYSIS  100 mcg Intravenous Q Sat-HD  . doxercalciferol  2 mcg Intravenous Q T,Th,Sa-HD  . febuxostat  40 mg Oral BID  . fluocinonide cream  1 application Topical BID  . heparin  5,000 Units Subcutaneous 3 times per day  . iron polysaccharides  150 mg Oral Daily  . lanthanum  1,000 mg Oral TID WC  . metoprolol tartrate  25 mg Oral BID  . piperacillin-tazobactam (ZOSYN)  IV  2.25 g Intravenous 3 times per day  . piperacillin-tazobactam  3.375 g Intravenous Once  . ranolazine  500 mg Oral q morning - 10a  . rosuvastatin  20 mg Oral QPM  . vancomycin  1,000 mg Intravenous Q T,Th,Sa-HD    have  reviewed scheduled and prn medications.  Physical Exam: General: bathing- NAD Heart: RRR Lungs: mostly clear Abdomen: soft, non tender Extremities: no obvious edema Dialysis Access: left AVF patent    01/01/2014,8:23 AM  LOS: 3 days

## 2014-01-01 NOTE — Discharge Instructions (Signed)
Please follow up with neurologist in 3-4 week  Please follow up with dial;ysis as scheduled Please follow up with primary care doctor in 1 week as needed

## 2014-01-01 NOTE — ED Provider Notes (Signed)
Medical screening examination/treatment/procedure(s) were conducted as a shared visit with non-physician practitioner(s) and myself.  I personally evaluated the patient during the encounter.   EKG Interpretation   Date/Time:  Thursday December 29 2013 13:37:03 EDT Ventricular Rate:  103 PR Interval:  146 QRS Duration: 72 QT Interval:  400 QTC Calculation: 524 R Axis:   -22 Text Interpretation:  Sinus tachycardia Probable left atrial enlargement  Borderline left axis deviation Abnormal R-wave progression, late  transition Abnormal inferior Q waves Abnormal T, consider ischemia,  lateral leads Minimal ST elevation, inferior leads Prolonged QT interval  No significant change since last tracing Confirmed by YAO  MD, DAVID  ([id number]) on 12/29/2013 1:42:40 PM        Suzi RootsKevin E Kriti Katayama, MD 01/01/14 [phone]

## 2014-01-01 NOTE — Progress Notes (Signed)
ANTIBIOTIC CONSULT NOTE - Follow-Up  Pharmacy Consult for Vancomycin/Zosyn Indication: sepsis  Allergies  Allergen Reactions  . Shrimp [Shellfish Allergy] Shortness Of Breath  . Atorvastatin Other (See Comments)    REACTION: weakness  . Insulins Other (See Comments)    Patient unsure as to the kind of insulin but states that it has previously caused seizures. This occurred in the hospital in 2006; but in most recent hospitalization (Jan 2013) received insulin but did not have reaction  . Simvastatin Other (See Comments)    REACTION: abnormal liver tests  . Ultram [Tramadol] Other (See Comments)    Liver enzyme     Patient Measurements: Height: 6' (182.9 cm) Weight: 228 lb 6.3 oz (103.6 kg) IBW/kg (Calculated) : 77.6  Vital Signs: Temp: 99.1 F (37.3 C) (10/18 0958) Temp Source: Oral (10/18 0958) BP: 171/83 mmHg (10/18 0958) Pulse Rate: 87 (10/18 0958)  Labs:  Recent Labs  12/30/13 0530 12/31/13 0415 12/31/13 1238  WBC 14.8* 13.9*  --   HGB 8.7* 8.0*  --   PLT 207 212  --   CREATININE 8.59*  --  11.33*   Estimated Creatinine Clearance: 9.4 ml/min (by C-G formula based on Cr of 11.33). No results found for this basename: VANCOTROUGH, VANCOPEAK, VANCORANDOM, GENTTROUGH, GENTPEAK, GENTRANDOM, TOBRATROUGH, TOBRAPEAK, TOBRARND, AMIKACINPEAK, AMIKACINTROU, AMIKACIN,  in the last 72 hours   Microbiology: Recent Results (from the past 720 hour(s))  CULTURE, BLOOD (ROUTINE X 2)     Status: None   Collection Time    12/29/13 10:13 PM      Result Value Ref Range Status   Specimen Description BLOOD RIGHT FOREARM   Final   Special Requests BOTTLES DRAWN AEROBIC AND ANAEROBIC 10CC EACH   Final   Culture  Setup Time     Final   Value: 12/30/2013 08:46     [facility]   Culture     Final   Value:        BLOOD CULTURE RECEIVED NO GROWTH TO DATE CULTURE WILL BE HELD FOR 5 DAYS BEFORE ISSUING A FINAL NEGATIVE REPORT     Performed at [facility]   Report Status PENDING   Incomplete  CULTURE, BLOOD (ROUTINE X 2)     Status: None   Collection Time    12/29/13 10:21 PM      Result Value Ref Range Status   Specimen Description BLOOD RIGHT HAND   Final   Special Requests BOTTLES DRAWN AEROBIC ONLY [facility]7CC   Final   Culture  Setup Time     Final   Value: 12/30/2013 08:47     Performed at [facility]   Culture     Final   Value:        BLOOD CULTURE RECEIVED NO GROWTH TO DATE CULTURE WILL BE HELD FOR 5 DAYS BEFORE ISSUING A FINAL NEGATIVE REPORT     Performed at [facility]   Report Status PENDING   Incomplete  MRSA PCR SCREENING     Status: None   Collection Time    12/30/13  4:36 PM      Result Value Ref Range Status   MRSA by PCR NEGATIVE  NEGATIVE Final   Comment:            The GeneXpert MRSA Assay (FDA     approved for NASAL specimens     only), is one component of a     comprehensive MRSA colonization  surveillance program. It is not     intended to diagnose MRSA     infection nor to guide or     monitor treatment for     MRSA infections.    Medical History: Past Medical History  Diagnosis Date  . ACHALASIA   . CEREBROVASCULAR ACCIDENT, HX OF   . Headache(784.0)   . HYPERLIPIDEMIA     takes crestor daily  . RENAL CALCULUS, HX OF   . RENAL INSUFFICIENCY   . H/O hiatal hernia   . CAD (coronary artery disease), 3 vessel significant disease.    . Impaired glucose tolerance   . S/P PTCA (percutaneous transluminal coronary angioplasty), 06/30/11 07/01/2011  . CKD (chronic kidney disease) stage 4, GFR 15-29 ml/min   . History of diastolic dysfunction, grade 2 by echo   . S/P coronary artery stent placement, PTCA/DES Resolute to mid-LAD via the LIMA graft, and PTCA of apical 95% stenosis 07/05/11 07/04/2011  . DEPRESSION   . Sleep apnea     SLEEP STUDY IN [place] , NO MACHINE YET  1 YEAR  . Seizures     INSULIN INDUCED  . ANEMIA-NOS   . NSTEMI (non-ST elevated myocardial infarction), 06/30/11 07/01/2011   . Stroke     93/2005/2006/2007;left sided weakness  . DEGENERATIVE JOINT DISEASE, SHOULDER     knee  . Gout     takes Uloric daily  . GERD     no meds required  . History of colon polyps   . HYPERTENSION     takes Norvasc,Catapress,Hydralazine,Imdur,and Metoprolol daily  . Insomnia     takes Ambien nightly  . Hyperlipidemia    Assessment: 53 y/o M from dialysis w/ left sided weakness. HD schedule TThSat. Tm 100, WBC 13.9.  Empiric antibiotics for potential sepsis. S/p HD yesterday with 3.5 hr HD and BFR 400.  Appropriately received dose of vancomycin after HD.  Cultures remain negative.  Goal of Therapy:  Pre-dialysis vancomycin 15-25  Plan:  Continue Vancomycin 1000mg  qHD Continue Zosyn 2.25g q8h Noted plans to d/c home today on levaquin.  Tad Moore, BCPS  Clinical Pharmacist Pager [phone]  01/01/2014 1:46 PM

## 2014-01-01 NOTE — Discharge Summary (Signed)
Physician Discharge Summary  Eulis CannerMichael B Longoria ZOX:[id number] DOB: 09/01/1960 DOA: 12/29/2013  PCP: [name], MD  Admit date: 12/29/2013 Discharge date: 01/01/2014  Time spent: >35 minutes  Recommendations for Outpatient Follow-up:   follow up with neurologist in 3-4 week   follow up with dial;ysis as scheduled  follow up with primary care doctor in 1 week as needed  Discharge Diagnoses:  Principal Problem:   Acute ischemic left ACA stroke Active Problems:   Hypertension, accelerated   ESRD on dialysis   CVD (cerebrovascular disease)   SIRS (systemic inflammatory response syndrome)   Discharge Condition: stable   Diet recommendation: renal,   Filed Weights   12/29/13 1339 12/31/13 1311 12/31/13 1755  Weight: 99.338 kg (219 lb) 105.4 kg (232 lb 5.8 oz) 103.6 kg (228 lb 6.3 oz)    History of present illness:    Hospital Course:  53 y/o y/o with PMH of HTN, CAD s/p CABG, HPL, ESRD dialysis TTS, prior CVAs, presents to ED with Left facial droop and left sided weakness while on HD and found to have Acute CVA   1. Acute CVA; symptoms resolved; h/o CVAs; probable etiology sever intracranial vascular disease -MRI: Acute infarct head of the caudate on the left, severe intracranial atherosclerosis;  -US: Preliminary report: Bilateral: 1-39% ICA stenosis. Vertebral artery flow is antegrade.  -Pt is on ASA and plavix, he quit taking brilinta last month, cont statin; d/w neurology who recommend to cont current regimen 2. SIRS/sepsis with probable UTI; blood cultures: NGTD; afebrile; sepsis physiology resolved  -Pt was on empiric zosyn/vanc, leukocytosis improved; afebrile; atx changed to PO to complete outpatient treatment regimen 3. CAD s/p CABG; patient denies any chest pain; on medical management; cont current regimen; Pt does want brilinta  4. ESRD on HD; TTS; cont per nephrology;  5. HTN, initial permissive HTN; decreased terazosin, cont current regimen, titartye meds as needed   6. Anemia, chronic likely renal; no obvious s/s of acute bleeding; Tf with HD prn    Echo: Study Conclusions  - Left ventricle: The cavity size was normal. Wall thickness was increased in a pattern of severe LVH. There was severe concentric hypertrophy. Systolic function was vigorous. The estimated ejection fraction was in the range of 65% to 70%. Wall motion was normal; there were no regional wall motion abnormalities. Doppler parameters are consistent with abnormal left ventricular relaxation (grade 1 diastolic dysfunction). - Aortic valve: There was trivial regurgitation. Valve area (Vmax): 4.77 cm^2. - Left atrium: The atrium was moderately to severely dilated. - Right atrium: The atrium was mildly dilated. - Atrial septum: No defect or patent foramen ovale was identified. - Pericardium, extracardiac: A trivial pericardial effusion was identified posterior to the heart. Features were not consistent with tamponade physiology.  Impressions:  - No cardiac source of emboli was indentified.    Procedures:  HD (i.e. Studies not automatically included, echos, thoracentesis, etc; not x-rays)  Consultations:  Neurology, nephrology   Discharge Exam: Filed Vitals:   01/01/14 0623  BP: 140/87  Pulse: 80  Temp:   Resp: 16    General: alert Cardiovascular: s1,s2 rrr Respiratory: CTA BL  Discharge Instructions  Discharge Instructions   Diet - low sodium heart healthy    Complete by:  As directed      Discharge instructions    Complete by:  As directed   Please follow up with neurologist in 3-4 week  Please follow up with dial;ysis as scheduled Please follow up with primary care doctor  in 1 week as needed     Increase activity slowly    Complete by:  As directed             Medication List         aspirin 81 MG EC tablet  Take 1 tablet (81 mg total) by mouth daily.     cloNIDine 0.2 MG tablet  Commonly known as:  CATAPRES  Take 0.2 mg by mouth 2 (two)  times daily.     clopidogrel 75 MG tablet  Commonly known as:  PLAVIX  Take 75 mg by mouth daily.     febuxostat 40 MG tablet  Commonly known as:  ULORIC  Take 40 mg by mouth 2 (two) times daily.     fluocinonide cream 0.05 %  Commonly known as:  LIDEX  Apply 1 application topically 2 (two) times daily.     hydrALAZINE 10 MG tablet  Commonly known as:  APRESOLINE  Take 10 mg by mouth 2 (two) times daily.     isosorbide mononitrate 30 MG 24 hr tablet  Commonly known as:  IMDUR  Take 1 tablet (30 mg total) by mouth daily.     levofloxacin 250 MG tablet  Commonly known as:  LEVAQUIN  Take 1 tablet (250 mg total) by mouth every other day.     metoprolol 50 MG tablet  Commonly known as:  LOPRESSOR  Take 50 mg by mouth 2 (two) times daily.     NITROSTAT 0.4 MG SL tablet  Generic drug:  nitroGLYCERIN  PLACE 1 TABLET UNDER TONGUE AS NEEDED FOR CHEST PAIN EVERY 5 MINUTES (MAX 3DOSES)     oxyCODONE 5 MG immediate release tablet  Commonly known as:  ROXICODONE  Take 1 tablet (5 mg total) by mouth every 4 (four) hours as needed for pain.     polysaccharide iron 150 MG capsule  Generic drug:  iron polysaccharides  Take 150 mg by mouth daily.     ranolazine 500 MG 12 hr tablet  Commonly known as:  RANEXA  Take 1 tablet (500 mg total) by mouth every morning.     rosuvastatin 20 MG tablet  Commonly known as:  CRESTOR  Take 1 tablet (20 mg total) by mouth every evening.     terazosin 5 MG capsule  Commonly known as:  HYTRIN  Take 1 capsule (5 mg total) by mouth at bedtime.     ticagrelor 90 MG Tabs tablet  Commonly known as:  BRILINTA  Take 90 mg by mouth daily.     zolpidem 10 MG tablet  Commonly known as:  AMBIEN  Take 1 tablet (10 mg total) by mouth at bedtime as needed for sleep.       Allergies  Allergen Reactions  . Shrimp [Shellfish Allergy] Shortness Of Breath  . Atorvastatin Other (See Comments)    REACTION: weakness  . Insulins Other (See Comments)     Patient unsure as to the kind of insulin but states that it has previously caused seizures. This occurred in the hospital in 2006; but in most recent hospitalization (Jan 2013) received insulin but did not have reaction  . Simvastatin Other (See Comments)    REACTION: abnormal liver tests  . Ultram [Tramadol] Other (See Comments)    Liver enzyme        Follow-up Information   Follow up with Oliver Barre, MD In 1 week.   Specialties:  Internal Medicine, Radiology   Contact information:   [address] [facility] [place] [address] [phone]       Follow up with Xu,Jindong, MD. Schedule an appointment as soon as possible for a visit in 4 weeks.   Specialty:  Neurology   Contact information:   [address] [place] [address] [phone]        The results of significant diagnostics from this hospitalization (including imaging, microbiology, ancillary and laboratory) are listed below for reference.    Significant Diagnostic Studies: Dg Chest 2 View  12/29/2013   CLINICAL DATA:  Leg weakness.  EXAM: CHEST  2 VIEW  COMPARISON:  June 08, 2012.  FINDINGS: The heart size and mediastinal contours are within normal limits. Status post coronary artery bypass graft. No pneumothorax or pleural effusion is noted. Both lungs are clear. The visualized skeletal structures are unremarkable.  IMPRESSION: No acute cardiopulmonary abnormality seen.   Electronically Signed   By: Roque Lias M.D.   On: 12/29/2013 16:51   Ct Head Wo Contrast  12/29/2013   CLINICAL DATA:  Code stroke, left facial droop  EXAM: CT HEAD WITHOUT CONTRAST  TECHNIQUE: Contiguous axial images were obtained from the base of the skull through the vertex without intravenous contrast.  COMPARISON:  MRI brain 07/06/2012  FINDINGS: There is no evidence of mass effect, midline shift, or extra-axial fluid collections. There is no evidence of a space-occupying lesion or intracranial hemorrhage. There is no evidence  of a cortical-based area of acute infarction. There is an old left basal ganglia lacunar infarct. There is an old cortical infarct of the right parietal lobe. There is generalized cerebral atrophy. There is periventricular white matter low attenuation likely secondary to microangiopathy.  The ventricles and sulci are appropriate for the patient's age. The basal cisterns are patent.  Visualized portions of the orbits are unremarkable. The visualized portions of the paranasal sinuses and mastoid air cells are unremarkable. Cerebrovascular atherosclerotic calcifications are noted.  The osseous structures are unremarkable.  IMPRESSION: No acute intracranial pathology.  These results were called by telephone at the time of interpretation on 12/29/2013 at 1:56 pm to Dr. Amada Jupiter, who verbally acknowledged these results.   Electronically Signed   By: Elige Ko   On: 12/29/2013 13:56   Mr Maxine Glenn Head Wo Contrast  12/29/2013   CLINICAL DATA:  Left leg weakness and left facial droop.  Stroke.  EXAM: MRI HEAD WITHOUT CONTRAST  MRA HEAD WITHOUT CONTRAST  TECHNIQUE: Multiplanar, multiecho pulse sequences of the brain and surrounding structures were obtained without intravenous contrast. Angiographic images of the head were obtained using MRA technique without contrast.  COMPARISON:  MRI 07/06/2012  FINDINGS: MRI HEAD FINDINGS  Acute infarct in the head of the caudate on the left measuring approximately 15 mm. Adjacent to this there is an area of chronic hemorrhagic infarct which was present previously involving the internal capsule and putamen.  Chronic microvascular ischemic change in the white matter and thalamus. Chronic lacune in the left paracentral pons. Chronic small infarct in the right parietal lobe is unchanged.  Negative for mass or edema. No hydrocephalus or shift of the midline structures.  MRA HEAD FINDINGS  Image quality degraded by significant artifact from motion as well as decreased signal in the circle  of Willis diffusely which may be due to patient position or severe atherosclerotic disease.  Both vertebral arteries are patent. The basilar is patent. Focal severe stenosis in the mid left posterior cerebral artery unchanged from 2007.  Internal carotid artery is  patent bilaterally with a moderate to severe stenosis in the right cavernous carotid. Left cavernous carotid patent. Decreased signal in the anterior and middle cerebral arteries bilaterally which is at least partially due to advanced atherosclerotic disease which may have progressed since 2007. Some this could be due to artifact.  IMPRESSION: Acute infarct head of the caudate on the left.  Moderate chronic ischemic changes.  Severe intracranial atherosclerotic disease. There is decreased signal in the anterior and middle cerebral arteries bilaterally felt to be due to combination of advanced atherosclerotic disease and and a degree of artifact due to technique.   Electronically Signed   By: Marlan Palau M.D.   On: 12/29/2013 20:07   Mr Brain Wo Contrast  12/29/2013   CLINICAL DATA:  Left leg weakness and left facial droop.  Stroke.  EXAM: MRI HEAD WITHOUT CONTRAST  MRA HEAD WITHOUT CONTRAST  TECHNIQUE: Multiplanar, multiecho pulse sequences of the brain and surrounding structures were obtained without intravenous contrast. Angiographic images of the head were obtained using MRA technique without contrast.  COMPARISON:  MRI 07/06/2012  FINDINGS: MRI HEAD FINDINGS  Acute infarct in the head of the caudate on the left measuring approximately 15 mm. Adjacent to this there is an area of chronic hemorrhagic infarct which was present previously involving the internal capsule and putamen.  Chronic microvascular ischemic change in the white matter and thalamus. Chronic lacune in the left paracentral pons. Chronic small infarct in the right parietal lobe is unchanged.  Negative for mass or edema. No hydrocephalus or shift of the midline structures.  MRA HEAD  FINDINGS  Image quality degraded by significant artifact from motion as well as decreased signal in the circle of Willis diffusely which may be due to patient position or severe atherosclerotic disease.  Both vertebral arteries are patent. The basilar is patent. Focal severe stenosis in the mid left posterior cerebral artery unchanged from 2007.  Internal carotid artery is patent bilaterally with a moderate to severe stenosis in the right cavernous carotid. Left cavernous carotid patent. Decreased signal in the anterior and middle cerebral arteries bilaterally which is at least partially due to advanced atherosclerotic disease which may have progressed since 2007. Some this could be due to artifact.  IMPRESSION: Acute infarct head of the caudate on the left.  Moderate chronic ischemic changes.  Severe intracranial atherosclerotic disease. There is decreased signal in the anterior and middle cerebral arteries bilaterally felt to be due to combination of advanced atherosclerotic disease and and a degree of artifact due to technique.   Electronically Signed   By: Marlan Palau M.D.   On: 12/29/2013 20:07    Microbiology: Recent Results (from the past 240 hour(s))  CULTURE, BLOOD (ROUTINE X 2)     Status: None   Collection Time    12/29/13 10:13 PM      Result Value Ref Range Status   Specimen Description BLOOD RIGHT FOREARM   Final   Special Requests BOTTLES DRAWN AEROBIC AND ANAEROBIC 10CC EACH   Final   Culture  Setup Time     Final   Value: 12/30/2013 08:46     Performed at [facility]   Culture     Final   Value:        BLOOD CULTURE RECEIVED NO GROWTH TO DATE CULTURE WILL BE HELD FOR 5 DAYS BEFORE ISSUING A FINAL NEGATIVE REPORT     Performed at [facility]   Report Status PENDING   Incomplete  CULTURE,  BLOOD (ROUTINE X 2)     Status: None   Collection Time    12/29/13 10:21 PM      Result Value Ref Range Status   Specimen Description BLOOD RIGHT HAND   Final   Special  Requests BOTTLES DRAWN AEROBIC ONLY [facility]   Final   Culture  Setup Time     Final   Value: 12/30/2013 08:47     Performed at [facility]   Culture     Final   Value:        BLOOD CULTURE RECEIVED NO GROWTH TO DATE CULTURE WILL BE HELD FOR 5 DAYS BEFORE ISSUING A FINAL NEGATIVE REPORT     Performed at [facility]   Report Status PENDING   Incomplete  MRSA PCR SCREENING     Status: None   Collection Time    12/30/13  4:36 PM      Result Value Ref Range Status   MRSA by PCR NEGATIVE  NEGATIVE Final   Comment:            The GeneXpert MRSA Assay (FDA     approved for NASAL specimens     only), is one component of a     comprehensive MRSA colonization     surveillance program. It is not     intended to diagnose MRSA     infection nor to guide or     monitor treatment for     MRSA infections.     Labs: Basic Metabolic Panel:  Recent Labs Lab 12/29/13 1324 12/29/13 1333 12/30/13 0530 12/31/13 1238  NA 136* 135* 134* 130*  K 4.5 4.4 4.1 3.8  CL 90* 93* 92* 87*  CO2 26  --  28 25  GLUCOSE 166* 172* 132* 134*  BUN 23 28* 34* 53*  CREATININE 6.72* 7.10* 8.59* 11.33*  CALCIUM 9.2  --  8.8 8.8  PHOS  --   --   --  5.9*   Liver Function Tests:  Recent Labs Lab 12/29/13 1324 12/31/13 1238  AST 32  --   ALT 11  --   ALKPHOS 49  --   BILITOT 0.9  --   PROT 8.0  --   ALBUMIN 3.8 2.8*   No results found for this basename: LIPASE, AMYLASE,  in the last 168 hours No results found for this basename: AMMONIA,  in the last 168 hours CBC:  Recent Labs Lab 12/29/13 1324 12/29/13 1333 12/30/13 0530 12/31/13 0415  WBC 19.7*  --  14.8* 13.9*  NEUTROABS 16.7*  --   --   --   HGB 11.3* 12.6* 8.7* 8.0*  HCT 33.1* 37.0* 25.6* 23.7*  MCV 94.0  --  92.4 92.9  PLT PLATELET CLUMPS NOTED ON SMEAR, COUNT APPEARS ADEQUATE  --  207 212   Cardiac Enzymes:  Recent Labs Lab 12/29/13 1300  TROPONINI <0.30   BNP: BNP (last 3 results) No results found for this  basename: PROBNP,  in the last 8760 hours CBG:  Recent Labs Lab 12/29/13 1348  GLUCAP 155*       Signed:  Zykerria Tanton N  [facility] 01/01/2014, 8:31 AM

## 2014-01-02 NOTE — Care Management Note (Addendum)
  Page 1 of 1   01/02/2014     10:36:58 AM CARE MANAGEMENT NOTE 01/02/2014  Patient:  [name], [name]   Account Number:  [id number]  Date Initiated:  12/30/2013  Documentation initiated by:  Elmer Bales  Subjective/Objective Assessment:   Patient was admitted with CVA. Lives at home alone. Chronic HD patient.     Action/Plan:   Will follow for discharge needs pending PT/OT evals and physician orders.   Anticipated DC Date:     Anticipated DC Plan:           Choice offered to / List presented to:          [facility] arranged  HH-1 RN  HH-6 SOCIAL WORKER      HH agency  [facility]   Status of service:  Completed, signed off Medicare Important Message given?   (If response is "NO", the following Medicare IM given date fields will be blank) Date Medicare IM given:   Medicare IM given by:   Date Additional Medicare IM given:   Additional Medicare IM given by:    Discharge Disposition:    Per UR Regulation:  Reviewed for med. necessity/level of care/duration of stay  If discussed at Long Length of Stay Meetings, dates discussed:    Comments:  01/02/14 1030 Elmer Bales RN, MSN, CM- Patient was discharged on 01/01/14 with home health orders. CM contacted patient via phone. Patient is agreeable to home health and has chosen [facility], which he has used in the past.  Amy with [facility] was notified and has accepted the referral.  Patient's address and phone number were verified and are correct in the chart.

## 2014-01-04 DIAGNOSIS — I129 Hypertensive chronic kidney disease with stage 1 through stage 4 chronic kidney disease, or unspecified chronic kidney disease: Secondary | ICD-10-CM | POA: Diagnosis not present

## 2014-01-04 DIAGNOSIS — G8929 Other chronic pain: Secondary | ICD-10-CM | POA: Diagnosis not present

## 2014-01-04 DIAGNOSIS — N186 End stage renal disease: Secondary | ICD-10-CM | POA: Diagnosis not present

## 2014-01-04 DIAGNOSIS — Z8673 Personal history of transient ischemic attack (TIA), and cerebral infarction without residual deficits: Secondary | ICD-10-CM | POA: Diagnosis not present

## 2014-01-04 DIAGNOSIS — I2510 Atherosclerotic heart disease of native coronary artery without angina pectoris: Secondary | ICD-10-CM | POA: Diagnosis not present

## 2014-01-04 DIAGNOSIS — Z992 Dependence on renal dialysis: Secondary | ICD-10-CM | POA: Diagnosis not present

## 2014-01-04 DIAGNOSIS — R6510 Systemic inflammatory response syndrome (SIRS) of non-infectious origin without acute organ dysfunction: Secondary | ICD-10-CM | POA: Diagnosis not present

## 2014-01-04 DIAGNOSIS — M549 Dorsalgia, unspecified: Secondary | ICD-10-CM | POA: Diagnosis not present

## 2014-01-05 LAB — CULTURE, BLOOD (ROUTINE X 2)
CULTURE: NO GROWTH
Culture: NO GROWTH

## 2014-01-06 ENCOUNTER — Telehealth: Payer: Self-pay | Admitting: Internal Medicine

## 2014-01-06 DIAGNOSIS — N186 End stage renal disease: Secondary | ICD-10-CM | POA: Diagnosis not present

## 2014-01-06 DIAGNOSIS — I129 Hypertensive chronic kidney disease with stage 1 through stage 4 chronic kidney disease, or unspecified chronic kidney disease: Secondary | ICD-10-CM | POA: Diagnosis not present

## 2014-01-06 DIAGNOSIS — R6510 Systemic inflammatory response syndrome (SIRS) of non-infectious origin without acute organ dysfunction: Secondary | ICD-10-CM | POA: Diagnosis not present

## 2014-01-06 DIAGNOSIS — M549 Dorsalgia, unspecified: Secondary | ICD-10-CM | POA: Diagnosis not present

## 2014-01-06 DIAGNOSIS — Z8673 Personal history of transient ischemic attack (TIA), and cerebral infarction without residual deficits: Secondary | ICD-10-CM | POA: Diagnosis not present

## 2014-01-06 DIAGNOSIS — I2510 Atherosclerotic heart disease of native coronary artery without angina pectoris: Secondary | ICD-10-CM | POA: Diagnosis not present

## 2014-01-06 NOTE — Telephone Encounter (Signed)
Requesting order for PT.  Patient was just release from hospital.  Patient had stroke.  Hospital didn't order PT.

## 2014-01-06 NOTE — Telephone Encounter (Signed)
Advise.  Patient had tv fall against left lower leg.  It is a little swollen with a small aberration.

## 2014-01-06 NOTE — Telephone Encounter (Signed)
Ok for verbal 

## 2014-01-06 NOTE — Telephone Encounter (Signed)
unfort we are ending up the day now; please consider sat clinic if not bleeding and can walk and thinks he can wait, o/w should see UC now

## 2014-01-06 NOTE — Telephone Encounter (Signed)
Called the patient informed of MD instructions regarding appointment.  The patient stated he is ok to wait and did schedule appt. With PCP for on 01/10/14.  Stated he would go to UC or sat. Clinic if worsened.

## 2014-01-06 NOTE — Telephone Encounter (Signed)
HHRN informed 

## 2014-01-08 DIAGNOSIS — N186 End stage renal disease: Secondary | ICD-10-CM | POA: Diagnosis not present

## 2014-01-08 DIAGNOSIS — I129 Hypertensive chronic kidney disease with stage 1 through stage 4 chronic kidney disease, or unspecified chronic kidney disease: Secondary | ICD-10-CM | POA: Diagnosis not present

## 2014-01-08 DIAGNOSIS — Z8673 Personal history of transient ischemic attack (TIA), and cerebral infarction without residual deficits: Secondary | ICD-10-CM | POA: Diagnosis not present

## 2014-01-08 DIAGNOSIS — M549 Dorsalgia, unspecified: Secondary | ICD-10-CM | POA: Diagnosis not present

## 2014-01-08 DIAGNOSIS — I2510 Atherosclerotic heart disease of native coronary artery without angina pectoris: Secondary | ICD-10-CM | POA: Diagnosis not present

## 2014-01-08 DIAGNOSIS — R6510 Systemic inflammatory response syndrome (SIRS) of non-infectious origin without acute organ dysfunction: Secondary | ICD-10-CM | POA: Diagnosis not present

## 2014-01-09 ENCOUNTER — Telehealth: Payer: Self-pay | Admitting: Internal Medicine

## 2014-01-09 DIAGNOSIS — I2510 Atherosclerotic heart disease of native coronary artery without angina pectoris: Secondary | ICD-10-CM | POA: Diagnosis not present

## 2014-01-09 DIAGNOSIS — R6510 Systemic inflammatory response syndrome (SIRS) of non-infectious origin without acute organ dysfunction: Secondary | ICD-10-CM | POA: Diagnosis not present

## 2014-01-09 DIAGNOSIS — N186 End stage renal disease: Secondary | ICD-10-CM | POA: Diagnosis not present

## 2014-01-09 DIAGNOSIS — M549 Dorsalgia, unspecified: Secondary | ICD-10-CM | POA: Diagnosis not present

## 2014-01-09 DIAGNOSIS — I129 Hypertensive chronic kidney disease with stage 1 through stage 4 chronic kidney disease, or unspecified chronic kidney disease: Secondary | ICD-10-CM | POA: Diagnosis not present

## 2014-01-09 DIAGNOSIS — Z8673 Personal history of transient ischemic attack (TIA), and cerebral infarction without residual deficits: Secondary | ICD-10-CM | POA: Diagnosis not present

## 2014-01-09 NOTE — Telephone Encounter (Signed)
[facility].  Needs signature from Dr. Jonny RuizJohn.  Would like faxed back tomorrow 10/27 if possible.

## 2014-01-10 ENCOUNTER — Inpatient Hospital Stay: Payer: Medicare Other | Admitting: Internal Medicine

## 2014-01-10 ENCOUNTER — Telehealth: Payer: Self-pay

## 2014-01-10 DIAGNOSIS — I2510 Atherosclerotic heart disease of native coronary artery without angina pectoris: Secondary | ICD-10-CM | POA: Diagnosis not present

## 2014-01-10 DIAGNOSIS — Z8673 Personal history of transient ischemic attack (TIA), and cerebral infarction without residual deficits: Secondary | ICD-10-CM | POA: Diagnosis not present

## 2014-01-10 DIAGNOSIS — N186 End stage renal disease: Secondary | ICD-10-CM | POA: Diagnosis not present

## 2014-01-10 DIAGNOSIS — I129 Hypertensive chronic kidney disease with stage 1 through stage 4 chronic kidney disease, or unspecified chronic kidney disease: Secondary | ICD-10-CM | POA: Diagnosis not present

## 2014-01-10 DIAGNOSIS — M549 Dorsalgia, unspecified: Secondary | ICD-10-CM | POA: Diagnosis not present

## 2014-01-10 DIAGNOSIS — R6510 Systemic inflammatory response syndrome (SIRS) of non-infectious origin without acute organ dysfunction: Secondary | ICD-10-CM | POA: Diagnosis not present

## 2014-01-10 NOTE — Telephone Encounter (Signed)
PT with [facility] Marylu Lund would like to add OT for this patient.  Verbal is ok. Call back number [phone]

## 2014-01-10 NOTE — Telephone Encounter (Signed)
HHRN informed 

## 2014-01-10 NOTE — Telephone Encounter (Signed)
Ok for verbal 

## 2014-01-13 DIAGNOSIS — Z8673 Personal history of transient ischemic attack (TIA), and cerebral infarction without residual deficits: Secondary | ICD-10-CM | POA: Diagnosis not present

## 2014-01-13 DIAGNOSIS — I129 Hypertensive chronic kidney disease with stage 1 through stage 4 chronic kidney disease, or unspecified chronic kidney disease: Secondary | ICD-10-CM | POA: Diagnosis not present

## 2014-01-13 DIAGNOSIS — M549 Dorsalgia, unspecified: Secondary | ICD-10-CM | POA: Diagnosis not present

## 2014-01-13 DIAGNOSIS — R6510 Systemic inflammatory response syndrome (SIRS) of non-infectious origin without acute organ dysfunction: Secondary | ICD-10-CM | POA: Diagnosis not present

## 2014-01-13 DIAGNOSIS — I2510 Atherosclerotic heart disease of native coronary artery without angina pectoris: Secondary | ICD-10-CM | POA: Diagnosis not present

## 2014-01-13 DIAGNOSIS — N186 End stage renal disease: Secondary | ICD-10-CM | POA: Diagnosis not present

## 2014-01-14 DIAGNOSIS — N186 End stage renal disease: Secondary | ICD-10-CM | POA: Diagnosis not present

## 2014-01-14 DIAGNOSIS — Z992 Dependence on renal dialysis: Secondary | ICD-10-CM | POA: Diagnosis not present

## 2014-01-16 DIAGNOSIS — Z0279 Encounter for issue of other medical certificate: Secondary | ICD-10-CM

## 2014-01-16 DIAGNOSIS — M549 Dorsalgia, unspecified: Secondary | ICD-10-CM | POA: Diagnosis not present

## 2014-01-16 DIAGNOSIS — R6510 Systemic inflammatory response syndrome (SIRS) of non-infectious origin without acute organ dysfunction: Secondary | ICD-10-CM | POA: Diagnosis not present

## 2014-01-16 DIAGNOSIS — I2510 Atherosclerotic heart disease of native coronary artery without angina pectoris: Secondary | ICD-10-CM | POA: Diagnosis not present

## 2014-01-16 DIAGNOSIS — I129 Hypertensive chronic kidney disease with stage 1 through stage 4 chronic kidney disease, or unspecified chronic kidney disease: Secondary | ICD-10-CM | POA: Diagnosis not present

## 2014-01-16 DIAGNOSIS — N186 End stage renal disease: Secondary | ICD-10-CM | POA: Diagnosis not present

## 2014-01-16 DIAGNOSIS — Z8673 Personal history of transient ischemic attack (TIA), and cerebral infarction without residual deficits: Secondary | ICD-10-CM | POA: Diagnosis not present

## 2014-01-17 DIAGNOSIS — N186 End stage renal disease: Secondary | ICD-10-CM | POA: Diagnosis not present

## 2014-01-17 DIAGNOSIS — N2581 Secondary hyperparathyroidism of renal origin: Secondary | ICD-10-CM | POA: Diagnosis not present

## 2014-01-17 DIAGNOSIS — D631 Anemia in chronic kidney disease: Secondary | ICD-10-CM | POA: Diagnosis not present

## 2014-01-18 ENCOUNTER — Encounter: Payer: Self-pay | Admitting: Internal Medicine

## 2014-01-18 ENCOUNTER — Ambulatory Visit (INDEPENDENT_AMBULATORY_CARE_PROVIDER_SITE_OTHER): Payer: Medicare Other | Admitting: Internal Medicine

## 2014-01-18 VITALS — BP 114/80 | HR 84 | Temp 98.5°F | Wt 224.5 lb

## 2014-01-18 DIAGNOSIS — N186 End stage renal disease: Secondary | ICD-10-CM | POA: Diagnosis not present

## 2014-01-18 DIAGNOSIS — I10 Essential (primary) hypertension: Secondary | ICD-10-CM | POA: Diagnosis not present

## 2014-01-18 DIAGNOSIS — R6510 Systemic inflammatory response syndrome (SIRS) of non-infectious origin without acute organ dysfunction: Secondary | ICD-10-CM | POA: Diagnosis not present

## 2014-01-18 DIAGNOSIS — Z8673 Personal history of transient ischemic attack (TIA), and cerebral infarction without residual deficits: Secondary | ICD-10-CM | POA: Diagnosis not present

## 2014-01-18 DIAGNOSIS — R7302 Impaired glucose tolerance (oral): Secondary | ICD-10-CM

## 2014-01-18 DIAGNOSIS — E785 Hyperlipidemia, unspecified: Secondary | ICD-10-CM

## 2014-01-18 DIAGNOSIS — L609 Nail disorder, unspecified: Secondary | ICD-10-CM | POA: Diagnosis not present

## 2014-01-18 DIAGNOSIS — M549 Dorsalgia, unspecified: Secondary | ICD-10-CM | POA: Diagnosis not present

## 2014-01-18 DIAGNOSIS — I208 Other forms of angina pectoris: Secondary | ICD-10-CM | POA: Diagnosis not present

## 2014-01-18 DIAGNOSIS — I129 Hypertensive chronic kidney disease with stage 1 through stage 4 chronic kidney disease, or unspecified chronic kidney disease: Secondary | ICD-10-CM | POA: Diagnosis not present

## 2014-01-18 DIAGNOSIS — I2510 Atherosclerotic heart disease of native coronary artery without angina pectoris: Secondary | ICD-10-CM | POA: Diagnosis not present

## 2014-01-18 MED ORDER — ROSUVASTATIN CALCIUM 20 MG PO TABS: 20.0000 mg | ORAL_TABLET | Freq: Every evening | ORAL | Status: DC

## 2014-01-18 NOTE — Patient Instructions (Signed)
Please continue all other medications as before, and refills have been done if requested.  Please have the pharmacy call with any other refills you may need.  Please continue your efforts at being more active, low cholesterol diet, and weight control.  You are otherwise up to date with prevention measures today.  Please keep your appointments with your specialists as you may have planned  No lab work needed today  Please return in 6 months, or sooner if needed

## 2014-01-18 NOTE — Progress Notes (Signed)
Pre visit review using our clinic review tool, if applicable. No additional management support is needed unless otherwise documented below in the visit note. 

## 2014-01-18 NOTE — Progress Notes (Signed)
Subjective:    Patient ID: [name], male    DOB: 07/25/1960, 53 y.o.   MRN: [id number]  HPI Here to f/u; overall doing ok,  Pt denies chest pain, increased sob or doe, wheezing, orthopnea, PND, increased LE swelling, palpitations, dizziness or syncope.  Pt denies polydipsia, polyuria, or low sugar symptoms such as weakness or confusion improved with po intake.  Pt denies new neurological symptoms such as new headache, or facial or extremity weakness or numbness.   Pt states overall good compliance with meds, has been trying to follow lower cholesterol diet, with wt overall stable,  but little exercise however. Walks with cane today.  Has severe difficult nails right foot, asks for podiatry Past Medical History  Diagnosis Date  . ACHALASIA   . CEREBROVASCULAR ACCIDENT, HX OF   . Headache(784.0)   . HYPERLIPIDEMIA     takes crestor daily  . RENAL CALCULUS, HX OF   . RENAL INSUFFICIENCY   . H/O hiatal hernia   . CAD (coronary artery disease), 3 vessel significant disease.    . Impaired glucose tolerance   . S/P PTCA (percutaneous transluminal coronary angioplasty), 06/30/11 07/01/2011  . CKD (chronic kidney disease) stage 4, GFR 15-29 ml/min   . History of diastolic dysfunction, grade 2 by echo   . S/P coronary artery stent placement, PTCA/DES Resolute to mid-LAD via the LIMA graft, and PTCA of apical 95% stenosis 07/05/11 07/04/2011  . DEPRESSION   . Sleep apnea     [place] , NO MACHINE YET  1 YEAR  . Seizures     INSULIN INDUCED  . ANEMIA-NOS   . NSTEMI (non-ST elevated myocardial infarction), 06/30/11 07/01/2011  . Stroke     93/2005/2006/2007;left sided weakness  . DEGENERATIVE JOINT DISEASE, SHOULDER     knee  . Gout     takes Uloric daily  . GERD     no meds required  . History of colon polyps   . HYPERTENSION     takes Norvasc,Catapress,Hydralazine,Imdur,and Metoprolol daily  . Insomnia     takes Ambien nightly  . Hyperlipidemia    Past Surgical  History  Procedure Laterality Date  . Tonsillectomy    . Stomach surgery  2009    achalasia  . Knee surgery  01/07/2011    right  . Insertion of dialysis catheter  04/01/2011    Procedure: INSERTION OF DIALYSIS CATHETER;  Surgeon: Nilda SimmerBrian Liang-Yu Chen, MD;  [facility];  Service: Vascular;  Laterality: N/A;  . Cardiac catheterization    . Av fistula placement  12/02/2011    Procedure: ARTERIOVENOUS (AV) FISTULA CREATION;  Surgeon: Fransisco HertzBrian L Chen, MD;  [facility];  Service: Vascular;  Laterality: Left;  Ultrasound Guided  . Subclavian stent placement  12/20/2011  . Coronary artery bypass graft  04/01/2011    Procedure: CORONARY ARTERY BYPASS GRAFTING (CABG);  Surgeon: Kathlee NationsPeter Van Suann Larryrigt III, MD;  [facility];  Service: Open Heart Surgery;  Laterality: N/A;  . Coronary [facility]      [facility]  . Colonoscopy    . Hyperlipidemia    . Ligation of arteriovenous  fistula Left 06/08/2012    Procedure: LIGATION OF ARTERIOVENOUS  FISTULA;  Surgeon: Fransisco HertzBrian L Chen, MD;  [facility];  Service: Vascular;  Laterality: Left;  . Av fistula placement Left 06/08/2012    Procedure: ARTERIOVENOUS (AV) FISTULA CREATION;  Surgeon: Fransisco HertzBrian L Chen, MD;  Location: [facility] OR;  Service:  Vascular;  Laterality: Left;    reports that he has never smoked. He quit smokeless tobacco use about 10 years ago. His smokeless tobacco use included Chew. He reports that he does not drink alcohol or use illicit drugs. family history includes Cancer in his father and mother; Heart disease in his father and mother; Hyperlipidemia in his mother and other; Hypertension in his mother and other; Stroke in his other. Allergies  Allergen Reactions  . Shrimp [Shellfish Allergy] Shortness Of Breath  . Atorvastatin Other (See Comments)    REACTION: weakness  . Insulins Other (See Comments)    Patient unsure as to the kind of insulin but states that it has previously caused seizures. This occurred in the hospital in 2006; but in  most recent hospitalization (Jan 2013) received insulin but did not have reaction  . Simvastatin Other (See Comments)    REACTION: abnormal liver tests  . Ultram [Tramadol] Other (See Comments)    Liver enzyme    Current Outpatient Prescriptions on File Prior to Visit  Medication Sig Dispense Refill  . aspirin EC 81 MG EC tablet Take 1 tablet (81 mg total) by mouth daily.    . cloNIDine (CATAPRES) 0.2 MG tablet Take 0.2 mg by mouth 2 (two) times daily.     . clopidogrel (PLAVIX) 75 MG tablet Take 75 mg by mouth daily.    . febuxostat (ULORIC) 40 MG tablet Take 40 mg by mouth 2 (two) times daily.    . fluocinonide cream (LIDEX) 0.05 % Apply 1 application topically 2 (two) times daily.    . hydrALAZINE (APRESOLINE) 10 MG tablet Take 10 mg by mouth 2 (two) times daily.    . isosorbide mononitrate (IMDUR) 30 MG 24 hr tablet Take 1 tablet (30 mg total) by mouth daily. 30 tablet 6  . levofloxacin (LEVAQUIN) 250 MG tablet Take 1 tablet (250 mg total) by mouth every other day. 4 tablet 0  . metoprolol (LOPRESSOR) 50 MG tablet Take 50 mg by mouth 2 (two) times daily.    Marland Kitchen NITROSTAT 0.4 MG SL tablet PLACE 1 TABLET UNDER TONGUE AS NEEDED FOR CHEST PAIN EVERY 5 MINUTES (MAX 3DOSES) 25 tablet 4  . oxyCODONE (ROXICODONE) 5 MG immediate release tablet Take 1 tablet (5 mg total) by mouth every 4 (four) hours as needed for pain. 30 tablet 0  . polysaccharide iron (NIFEREX) 150 MG CAPS capsule Take 150 mg by mouth daily.    . ranolazine (RANEXA) 500 MG 12 hr tablet Take 1 tablet (500 mg total) by mouth every morning. 90 tablet 1  . terazosin (HYTRIN) 5 MG capsule Take 1 capsule (5 mg total) by mouth at bedtime. 30 capsule 1  . ticagrelor (BRILINTA) 90 MG TABS tablet Take 90 mg by mouth daily.    Marland Kitchen zolpidem (AMBIEN) 10 MG tablet Take 1 tablet (10 mg total) by mouth at bedtime as needed for sleep. 30 tablet 5   No current facility-administered medications on file prior to visit.   Review of Systems   Constitutional: Negative for unusual diaphoresis or other sweats  HENT: Negative for ringing in ear Eyes: Negative for double vision or worsening visual disturbance.  Respiratory: Negative for choking and stridor.   Gastrointestinal: Negative for vomiting or other signifcant bowel change Genitourinary: Negative for hematuria or decreased urine volume.  Musculoskeletal: Negative for other MSK pain or swelling Skin: Negative for color change and worsening wound.  Neurological: Negative for tremors and numbness other than noted  Psychiatric/Behavioral: Negative  for decreased concentration or agitation other than above       Objective:   Physical Exam BP 114/80 mmHg  Pulse 84  Temp(Src) 98.5 F (36.9 C) (Oral)  Wt 224 lb 8 oz (101.833 kg)  SpO2 98% VS noted,  Constitutional: Pt appears well-developed, well-nourished. Lavella Lemons HENT: Head: NCAT.  Right Ear: External ear normal.  Left Ear: External ear normal.  Eyes: . Pupils are equal, round, and reactive to light. Conjunctivae and EOM are normal Neck: Normal range of motion. Neck supple.  Cardiovascular: Normal rate and regular rhythm.   Pulmonary/Chest: Effort normal and breath sounds normal.  Abd:  Soft, NT, ND, + BS Neurological: Pt is alert. Not confused , motor grossly intact Skin: Skin is warm. No rash, right foot with mult nails severe deformed, no foot ulcers or drainage Psychiatric: Pt behavior is normal. No agitation.  Wt Readings from Last 3 Encounters:  01/18/14 224 lb 8 oz (101.833 kg)  12/31/13 228 lb 6.3 oz (103.6 kg)  12/22/13 219 lb (99.338 kg)       Assessment & Plan:

## 2014-01-19 DIAGNOSIS — D631 Anemia in chronic kidney disease: Secondary | ICD-10-CM | POA: Diagnosis not present

## 2014-01-19 DIAGNOSIS — N186 End stage renal disease: Secondary | ICD-10-CM | POA: Diagnosis not present

## 2014-01-19 DIAGNOSIS — N2581 Secondary hyperparathyroidism of renal origin: Secondary | ICD-10-CM | POA: Diagnosis not present

## 2014-01-20 ENCOUNTER — Telehealth: Payer: Self-pay | Admitting: Neurology

## 2014-01-20 DIAGNOSIS — Z8673 Personal history of transient ischemic attack (TIA), and cerebral infarction without residual deficits: Secondary | ICD-10-CM | POA: Diagnosis not present

## 2014-01-20 DIAGNOSIS — I129 Hypertensive chronic kidney disease with stage 1 through stage 4 chronic kidney disease, or unspecified chronic kidney disease: Secondary | ICD-10-CM | POA: Diagnosis not present

## 2014-01-20 DIAGNOSIS — I2510 Atherosclerotic heart disease of native coronary artery without angina pectoris: Secondary | ICD-10-CM | POA: Diagnosis not present

## 2014-01-20 DIAGNOSIS — M549 Dorsalgia, unspecified: Secondary | ICD-10-CM | POA: Diagnosis not present

## 2014-01-20 DIAGNOSIS — N186 End stage renal disease: Secondary | ICD-10-CM | POA: Diagnosis not present

## 2014-01-20 DIAGNOSIS — R6510 Systemic inflammatory response syndrome (SIRS) of non-infectious origin without acute organ dysfunction: Secondary | ICD-10-CM | POA: Diagnosis not present

## 2014-01-20 NOTE — Telephone Encounter (Signed)
Spoke with patient and confirmed appointment reschedule to 11/12 @ 1:15 pm.

## 2014-01-21 DIAGNOSIS — N186 End stage renal disease: Secondary | ICD-10-CM | POA: Diagnosis not present

## 2014-01-21 DIAGNOSIS — D631 Anemia in chronic kidney disease: Secondary | ICD-10-CM | POA: Diagnosis not present

## 2014-01-21 DIAGNOSIS — N2581 Secondary hyperparathyroidism of renal origin: Secondary | ICD-10-CM | POA: Diagnosis not present

## 2014-01-22 DIAGNOSIS — L609 Nail disorder, unspecified: Secondary | ICD-10-CM | POA: Insufficient documentation

## 2014-01-22 NOTE — Assessment & Plan Note (Signed)
Lab Results  Component Value Date   LDLCALC 89 12/30/2013   stable overall by history and exam, recent data reviewed with pt, and pt to continue medical treatment as before,  to f/u any worsening symptoms or concerns

## 2014-01-22 NOTE — Assessment & Plan Note (Signed)
For podiatry referral,  to f/u any worsening symptoms or concerns  

## 2014-01-22 NOTE — Assessment & Plan Note (Signed)
stable overall by history and exam, recent data reviewed with pt, and pt to continue medical treatment as before,  to f/u any worsening symptoms or concerns Lab Results  Component Value Date   HGBA1C 5.6 12/30/2013

## 2014-01-22 NOTE — Assessment & Plan Note (Signed)
stable overall by history and exam, recent data reviewed with pt, and pt to continue medical treatment as before,  to f/u any worsening symptoms or concerns BP Readings from Last 3 Encounters:  01/18/14 114/80  01/01/14 171/83  12/22/13 108/71

## 2014-01-23 DIAGNOSIS — M549 Dorsalgia, unspecified: Secondary | ICD-10-CM | POA: Diagnosis not present

## 2014-01-23 DIAGNOSIS — I129 Hypertensive chronic kidney disease with stage 1 through stage 4 chronic kidney disease, or unspecified chronic kidney disease: Secondary | ICD-10-CM | POA: Diagnosis not present

## 2014-01-23 DIAGNOSIS — R6510 Systemic inflammatory response syndrome (SIRS) of non-infectious origin without acute organ dysfunction: Secondary | ICD-10-CM | POA: Diagnosis not present

## 2014-01-23 DIAGNOSIS — I2510 Atherosclerotic heart disease of native coronary artery without angina pectoris: Secondary | ICD-10-CM | POA: Diagnosis not present

## 2014-01-23 DIAGNOSIS — N186 End stage renal disease: Secondary | ICD-10-CM | POA: Diagnosis not present

## 2014-01-23 DIAGNOSIS — Z8673 Personal history of transient ischemic attack (TIA), and cerebral infarction without residual deficits: Secondary | ICD-10-CM | POA: Diagnosis not present

## 2014-01-24 DIAGNOSIS — D631 Anemia in chronic kidney disease: Secondary | ICD-10-CM | POA: Diagnosis not present

## 2014-01-24 DIAGNOSIS — N2581 Secondary hyperparathyroidism of renal origin: Secondary | ICD-10-CM | POA: Diagnosis not present

## 2014-01-24 DIAGNOSIS — N186 End stage renal disease: Secondary | ICD-10-CM | POA: Diagnosis not present

## 2014-01-25 ENCOUNTER — Ambulatory Visit: Payer: Medicare Other | Admitting: Neurology

## 2014-01-26 ENCOUNTER — Ambulatory Visit (INDEPENDENT_AMBULATORY_CARE_PROVIDER_SITE_OTHER): Payer: Medicare Other | Admitting: Neurology

## 2014-01-26 ENCOUNTER — Encounter: Payer: Self-pay | Admitting: Neurology

## 2014-01-26 VITALS — BP 133/75 | HR 76 | Ht 72.0 in | Wt 221.0 lb

## 2014-01-26 DIAGNOSIS — D631 Anemia in chronic kidney disease: Secondary | ICD-10-CM | POA: Diagnosis not present

## 2014-01-26 DIAGNOSIS — I208 Other forms of angina pectoris: Secondary | ICD-10-CM | POA: Diagnosis not present

## 2014-01-26 DIAGNOSIS — N186 End stage renal disease: Secondary | ICD-10-CM | POA: Diagnosis not present

## 2014-01-26 DIAGNOSIS — I10 Essential (primary) hypertension: Secondary | ICD-10-CM

## 2014-01-26 DIAGNOSIS — I679 Cerebrovascular disease, unspecified: Secondary | ICD-10-CM

## 2014-01-26 DIAGNOSIS — N2581 Secondary hyperparathyroidism of renal origin: Secondary | ICD-10-CM | POA: Diagnosis not present

## 2014-01-26 NOTE — Progress Notes (Signed)
PATIENT: [name] DOB: 1960-05-17  HISTORICAL  TORREN Glenn is a 53 years old right-handed African American male, is referred by his primary care physician Dr. Shary Key for evaluation of left side weakness, gait difficulty  He had past medical history of poorly controlled diabetes, end-stage renal disease, started dialysis in August 2015, he received dialysis in Tuesday, Thursday, Saturday, through the left upper extremity AV fistula.  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 Chronic hemorrhagic infarction in the left paracentral pontine has progressed, basal ganglion, thalamus, chronic hemorrhagic infarction in the right frontal lobe.  Echocardiogram showed mild concentric left ventricular hypertrophy, ejection fraction was 35%, moderate to severe septal hypokinesia, moderate apical wall hypokinesis.  At baseline, the patient was exercise regularly. Ambulate without gait difficulty, in October third, after his hemodialysis, he took a nap, after woke up around 3 PM October third 2015, he noticed left leg weakness, also left arm weakness, worsening gait difficulty, difficulty lifting weights using his left arm.  He is currently taking aspirin, and Plavix.  January 26 2014 He was admitted to hospital in December 29 2013, for acute worsening left-sided weakness, difficulty bearing weight, could not raise  left arm against gravity,he was also noted to have hypotensive episode during hemodialysis,  With systolic blood pressure 110,  He was seen by stroke team,MRI of the brain showed Acute infarct head of the caudate on the left with multiple lacunar stroke at BG  and pons. Infarct felt secondary to severer atherosclerotic disease aggravated by hypotension. His weakness on the left did not correlate with the acute stroke, but more consistent with the recrudescence of his old lacunar stroke. New acute stroke likely silent stroke caused by hypotension.    MRI Acute infarct head of the caudate on the left with multiple lacunar stroke at BG and pons.  MRA Severe intracranial atherosclerotic disease, especially b/l MCAs.   Carotid Doppler Preliminary report: Bilateral: 1-39% ICA stenosis. Vertebral artery flow is antegrade.  2D Echo ejection fraction 60%, no wall formality,  LDL 89, not meeting the goal < 70  HgbA1c 5.6  His BP goal should be 120-150 and avoid hypotension. Need to coordinate with HD facility to avoid hypotension during dialysis  He had physical therapy, which has been helpful, he is now ambulate with a cane,   REVIEW OF SYSTEMS: Full 14 system review of systems performed and notable only for gait difficulty, left-sided weakness  ALLERGIES: Allergies  Allergen Reactions  . Shrimp [Shellfish Allergy] Shortness Of Breath  . Atorvastatin Other (See Comments)    REACTION: weakness  . Insulins Other (See Comments)    Patient unsure as to the kind of insulin but states that it has previously caused seizures. This occurred in the hospital in 2006; but in most recent hospitalization (Jan 2013) received insulin but did not have reaction  . Simvastatin Other (See Comments)    REACTION: abnormal liver tests  . Ultram [Tramadol] Other (See Comments)    Liver enzyme     HOME MEDICATIONS: Current Outpatient Prescriptions on File Prior to Visit  Medication Sig Dispense Refill  . aspirin EC 81 MG EC tablet Take 1 tablet (81 mg total) by mouth daily.    Marland Kitchen  cloNIDine (CATAPRES) 0.2 MG tablet Take 0.2 mg by mouth 2 (two) times daily.     . clopidogrel (PLAVIX) 75 MG tablet Take 75 mg by mouth daily.    . febuxostat (ULORIC) 40 MG tablet  Take 40 mg by mouth 2 (two) times daily.    . hydrALAZINE (APRESOLINE) 10 MG tablet Take 10 mg by mouth 2 (two) times daily.    . isosorbide mononitrate (IMDUR) 30 MG 24 hr tablet Take 1 tablet (30 mg total) by mouth daily. 30 tablet 6  . levofloxacin (LEVAQUIN) 250 MG tablet Take 1 tablet (250 mg total) by mouth every other day. 4 tablet 0  . metoprolol (LOPRESSOR) 50 MG tablet Take 50 mg by mouth 2 (two) times daily.    Marland Kitchen. NITROSTAT 0.4 MG SL tablet PLACE 1 TABLET UNDER TONGUE AS NEEDED FOR CHEST PAIN EVERY 5 MINUTES (MAX 3DOSES) 25 tablet 4  . oxyCODONE (ROXICODONE) 5 MG immediate release tablet Take 1 tablet (5 mg total) by mouth every 4 (four) hours as needed for pain. 30 tablet 0  . polysaccharide iron (NIFEREX) 150 MG CAPS capsule Take 150 mg by mouth daily.    . ranolazine (RANEXA) 500 MG 12 hr tablet Take 1 tablet (500 mg total) by mouth every morning. 90 tablet 1  . rosuvastatin (CRESTOR) 20 MG tablet Take 1 tablet (20 mg total) by mouth every evening. 90 tablet 3  . terazosin (HYTRIN) 5 MG capsule Take 1 capsule (5 mg total) by mouth at bedtime. 30 capsule 1  . ticagrelor (BRILINTA) 90 MG TABS tablet Take 90 mg by mouth daily.    Marland Kitchen. zolpidem (AMBIEN) 10 MG tablet Take 1 tablet (10 mg total) by mouth at bedtime as needed for sleep. 30 tablet 5   No current facility-administered medications on file prior to visit.    PAST MEDICAL HISTORY: Past Medical History  Diagnosis Date  . ACHALASIA   . CEREBROVASCULAR ACCIDENT, HX OF   . Headache(784.0)   . HYPERLIPIDEMIA     takes crestor daily  . RENAL CALCULUS, HX OF   . RENAL INSUFFICIENCY   . H/O hiatal hernia   . CAD (coronary artery disease), 3 vessel significant disease.    . Impaired glucose tolerance   . S/P PTCA (percutaneous transluminal coronary angioplasty), 06/30/11 07/01/2011  . CKD (chronic kidney disease) stage 4, GFR 15-29 ml/min   . History of diastolic dysfunction, grade 2 by echo   . S/P coronary artery stent  placement, PTCA/DES Resolute to mid-LAD via the LIMA graft, and PTCA of apical 95% stenosis 07/05/11 07/04/2011  . DEPRESSION   . Sleep apnea     [place]LEEP [place]UDY IN [place] , NO MACHINE YET  1 YEAR  . Seizures     INSULIN INDUCED  . ANEMIA-NOS   . NSTEMI (non-ST elevated myocardial infarction), 06/30/11 07/01/2011  . Stroke     93/2005/2006/2007;left sided weakness  . DEGENERATIVE JOINT DISEASE, SHOULDER     knee  . Gout     takes Uloric daily  . GERD     no meds required  . History of colon polyps   . HYPERTENSION     takes Norvasc,Catapress,Hydralazine,Imdur,and Metoprolol daily  . Insomnia     takes Ambien nightly  . Hyperlipidemia     PAST SURGICAL HISTORY: Past Surgical History  Procedure Laterality Date  . Tonsillectomy    . Stomach surgery  2009    achalasia  . Knee surgery  01/07/2011    right  .  Insertion of dialysis catheter  04/01/2011    Procedure: INSERTION OF DIALYSIS CATHETER;  Surgeon: Nilda Simmer, MD;  Location: [facility] OR;  Service: Vascular;  Laterality: N/A;  . Cardiac catheterization    . Av fistula placement  12/02/2011    Procedure: ARTERIOVENOUS (AV) FISTULA CREATION;  Surgeon: Fransisco Hertz, MD;  Location: [facility] OR;  Service: Vascular;  Laterality: Left;  Ultrasound Guided  . Subclavian stent placement  12/20/2011  . Coronary artery bypass graft  04/01/2011    Procedure: CORONARY ARTERY BYPASS GRAFTING (CABG);  Surgeon: Kathlee Nations Suann Larry, MD;  Location: [facility] OR;  Service: Open Heart Surgery;  Laterality: N/A;  . Coronary angioplasty      06/30/11   AT [facility]  . Colonoscopy    . Hyperlipidemia    . Ligation of arteriovenous  fistula Left 06/08/2012    Procedure: LIGATION OF ARTERIOVENOUS  FISTULA;  Surgeon: Fransisco Hertz, MD;  Location: [facility] OR;  Service: Vascular;  Laterality: Left;  . Av fistula placement Left 06/08/2012    Procedure: ARTERIOVENOUS (AV) FISTULA CREATION;  Surgeon: Fransisco Hertz, MD;  Location: [facility] OR;  Service: Vascular;  Laterality: Left;     FAMILY HISTORY: Family History  Problem Relation Age of Onset  . Hypertension Other   . Stroke Other   . Hyperlipidemia Other   . Cancer Mother   . Heart disease Mother   . Hyperlipidemia Mother   . Hypertension Mother   . Cancer Father   . Heart disease Father     SOCIAL HISTORY:  History   Social History  . Marital Status: Divorced    Spouse Name: N/A    Number of Children: N/A  . Years of Education: N/A   Occupational History  . Not on file.   Social History Main Topics  . Smoking status: Never Smoker   . Smokeless tobacco: Former Neurosurgeon    Types: Chew    Quit date: 03/18/2003  . Alcohol Use: No     Comment: rarely  . Drug Use: No  . Sexual Activity: Not Currently   Other Topics Concern  . Not on file   Social History Narrative     PHYSICAL EXAM   Filed Vitals:   01/26/14 1319  BP: 133/75  Pulse: 76  Height: 6' (1.829 m)  Weight: 221 lb (100.245 kg)    Not recorded      Body mass index is 29.97 kg/(m^2).   Generalized: In no acute distress  Neck: Supple, no carotid bruits   Cardiac: Regular rate rhythm  Pulmonary: Clear to auscultation bilaterally  Musculoskeletal: No deformity  Neurological examination  Mentation: Alert oriented to time, place, history taking, and causual conversation  Cranial nerve II-XII: Pupils were equal round reactive to light. Extraocular movements were full.  Visual field were full on confrontational test. Bilateral fundi were sharp.  Facial sensation and strength were normal. Hearing was intact to finger rubbing bilaterally. Uvula tongue midline.  Head turning and shoulder shrug and were normal and symmetric.Tongue protrusion into cheek strength was normal.  Motor: Mild left arm fixation of rapid alternating movement, mild left hip flexion, ankle dorsi flexion weakness,  Sensory: Intact to fine touch, pinprick, preserved vibratory sensation, and proprioception at toes.  Coordination: Normal finger to  nose, heel-to-shin bilaterally there was no truncal ataxia  Gait: Mild unsteady, dragging his left leg,  Romberg signs: Negative  Deep tendon reflexes: Brachioradialis 2/2, biceps 2/2, triceps 2/2, patellar 2/2, Achilles 2/2, plantar responses  were flexor bilaterally.   DIAGNOSTIC DATA (LABS, IMAGING, TESTING) - I reviewed patient records, labs, notes, testing and imaging myself where available.  Lab Results  Component Value Date   WBC 13.9* 12/31/2013   HGB 8.0* 12/31/2013   HCT 23.7* 12/31/2013   MCV 92.9 12/31/2013   PLT 212 12/31/2013      Component Value Date/Time   NA 130* 12/31/2013 1238   K 3.8 12/31/2013 1238   CL 87* 12/31/2013 1238   CO2 25 12/31/2013 1238   GLUCOSE 134* 12/31/2013 1238   BUN 53* 12/31/2013 1238   CREATININE 11.33* 12/31/2013 1238   CALCIUM 8.8 12/31/2013 1238   PROT 8.0 12/29/2013 1324   ALBUMIN 2.8* 12/31/2013 1238   AST 32 12/29/2013 1324   ALT 11 12/29/2013 1324   ALKPHOS 49 12/29/2013 1324   BILITOT 0.9 12/29/2013 1324   GFRNONAA 4* 12/31/2013 1238   GFRAA 5* 12/31/2013 1238   Lab Results  Component Value Date   CHOL 157 12/30/2013   HDL 47 12/30/2013   LDLCALC 89 12/30/2013   LDLDIRECT 118.1 11/10/2012   TRIG 107 12/30/2013   CHOLHDL 3.3 12/30/2013   Lab Results  Component Value Date   HGBA1C 5.6 12/30/2013   No results found for: [id number] Lab Results  Component Value Date   TSH 1.14 11/09/2013    ASSESSMENT AND PLAN  DANIIL BOERMAN is a 53 y.o. male with multiple vascular risk factors, including previous poorly controlled hypertension, end-stage renal disease, on aspirin, Plavix, previous MRI scan, and a history suggestive of multiple small vessel disease, was admitted to hospital in December 29 2013, for worsening left-sided weakness, repeat MRI of scan showed new stroke, also likely related to his hypotensive episodes during hemodialysis  1,  Keep aspirin Plavix 2.  Moderate exercise 3. Return to clinic in 3 -4   months month with nurse practitioner   Levert Feinstein, M.D. Ph.D.  [facility] [address], [address] [place], [place] [address] [phone]

## 2014-01-27 DIAGNOSIS — Y841 Kidney dialysis as the cause of abnormal reaction of the patient, or of later complication, without mention of misadventure at the time of the procedure: Secondary | ICD-10-CM | POA: Diagnosis not present

## 2014-01-27 DIAGNOSIS — N186 End stage renal disease: Secondary | ICD-10-CM | POA: Diagnosis not present

## 2014-01-27 DIAGNOSIS — I2510 Atherosclerotic heart disease of native coronary artery without angina pectoris: Secondary | ICD-10-CM | POA: Diagnosis not present

## 2014-01-27 DIAGNOSIS — I10 Essential (primary) hypertension: Secondary | ICD-10-CM | POA: Diagnosis not present

## 2014-01-28 DIAGNOSIS — N186 End stage renal disease: Secondary | ICD-10-CM | POA: Diagnosis not present

## 2014-01-28 DIAGNOSIS — D631 Anemia in chronic kidney disease: Secondary | ICD-10-CM | POA: Diagnosis not present

## 2014-01-28 DIAGNOSIS — N2581 Secondary hyperparathyroidism of renal origin: Secondary | ICD-10-CM | POA: Diagnosis not present

## 2014-01-30 ENCOUNTER — Encounter (HOSPITAL_COMMUNITY): Payer: Self-pay | Admitting: *Deleted

## 2014-01-31 DIAGNOSIS — N186 End stage renal disease: Secondary | ICD-10-CM | POA: Diagnosis not present

## 2014-01-31 DIAGNOSIS — N2581 Secondary hyperparathyroidism of renal origin: Secondary | ICD-10-CM | POA: Diagnosis not present

## 2014-01-31 DIAGNOSIS — D631 Anemia in chronic kidney disease: Secondary | ICD-10-CM | POA: Diagnosis not present

## 2014-02-01 ENCOUNTER — Encounter (HOSPITAL_COMMUNITY): Payer: Self-pay | Admitting: *Deleted

## 2014-02-02 DIAGNOSIS — N186 End stage renal disease: Secondary | ICD-10-CM | POA: Diagnosis not present

## 2014-02-02 DIAGNOSIS — D631 Anemia in chronic kidney disease: Secondary | ICD-10-CM | POA: Diagnosis not present

## 2014-02-02 DIAGNOSIS — N2581 Secondary hyperparathyroidism of renal origin: Secondary | ICD-10-CM | POA: Diagnosis not present

## 2014-02-04 DIAGNOSIS — N186 End stage renal disease: Secondary | ICD-10-CM | POA: Diagnosis not present

## 2014-02-04 DIAGNOSIS — N2581 Secondary hyperparathyroidism of renal origin: Secondary | ICD-10-CM | POA: Diagnosis not present

## 2014-02-04 DIAGNOSIS — D631 Anemia in chronic kidney disease: Secondary | ICD-10-CM | POA: Diagnosis not present

## 2014-02-06 DIAGNOSIS — N2581 Secondary hyperparathyroidism of renal origin: Secondary | ICD-10-CM | POA: Diagnosis not present

## 2014-02-06 DIAGNOSIS — D631 Anemia in chronic kidney disease: Secondary | ICD-10-CM | POA: Diagnosis not present

## 2014-02-06 DIAGNOSIS — N186 End stage renal disease: Secondary | ICD-10-CM | POA: Diagnosis not present

## 2014-02-08 ENCOUNTER — Ambulatory Visit: Payer: Medicare Other | Admitting: Podiatry

## 2014-02-08 DIAGNOSIS — Z8673 Personal history of transient ischemic attack (TIA), and cerebral infarction without residual deficits: Secondary | ICD-10-CM | POA: Diagnosis not present

## 2014-02-08 DIAGNOSIS — D631 Anemia in chronic kidney disease: Secondary | ICD-10-CM | POA: Diagnosis not present

## 2014-02-08 DIAGNOSIS — I129 Hypertensive chronic kidney disease with stage 1 through stage 4 chronic kidney disease, or unspecified chronic kidney disease: Secondary | ICD-10-CM | POA: Diagnosis not present

## 2014-02-08 DIAGNOSIS — N186 End stage renal disease: Secondary | ICD-10-CM | POA: Diagnosis not present

## 2014-02-08 DIAGNOSIS — R6510 Systemic inflammatory response syndrome (SIRS) of non-infectious origin without acute organ dysfunction: Secondary | ICD-10-CM | POA: Diagnosis not present

## 2014-02-08 DIAGNOSIS — N2581 Secondary hyperparathyroidism of renal origin: Secondary | ICD-10-CM | POA: Diagnosis not present

## 2014-02-11 DIAGNOSIS — N186 End stage renal disease: Secondary | ICD-10-CM | POA: Diagnosis not present

## 2014-02-11 DIAGNOSIS — D631 Anemia in chronic kidney disease: Secondary | ICD-10-CM | POA: Diagnosis not present

## 2014-02-11 DIAGNOSIS — N2581 Secondary hyperparathyroidism of renal origin: Secondary | ICD-10-CM | POA: Diagnosis not present

## 2014-02-13 DIAGNOSIS — N186 End stage renal disease: Secondary | ICD-10-CM | POA: Diagnosis not present

## 2014-02-13 DIAGNOSIS — Z992 Dependence on renal dialysis: Secondary | ICD-10-CM | POA: Diagnosis not present

## 2014-02-14 ENCOUNTER — Other Ambulatory Visit: Payer: Self-pay

## 2014-02-14 DIAGNOSIS — D631 Anemia in chronic kidney disease: Secondary | ICD-10-CM | POA: Diagnosis not present

## 2014-02-14 DIAGNOSIS — N186 End stage renal disease: Secondary | ICD-10-CM | POA: Diagnosis not present

## 2014-02-14 DIAGNOSIS — E8770 Fluid overload, unspecified: Secondary | ICD-10-CM | POA: Diagnosis not present

## 2014-02-14 MED ORDER — ZOLPIDEM TARTRATE 10 MG PO TABS: 10.0000 mg | ORAL_TABLET | Freq: Every evening | ORAL | Status: DC | PRN

## 2014-02-14 NOTE — Telephone Encounter (Signed)
Faxed hardcopy for zolpidem to [facility] [place]

## 2014-02-14 NOTE — Telephone Encounter (Signed)
Done hardcopy to [facility]  

## 2014-02-16 DIAGNOSIS — N186 End stage renal disease: Secondary | ICD-10-CM | POA: Diagnosis not present

## 2014-02-16 DIAGNOSIS — E8770 Fluid overload, unspecified: Secondary | ICD-10-CM | POA: Diagnosis not present

## 2014-02-16 DIAGNOSIS — D631 Anemia in chronic kidney disease: Secondary | ICD-10-CM | POA: Diagnosis not present

## 2014-02-17 DIAGNOSIS — N186 End stage renal disease: Secondary | ICD-10-CM | POA: Diagnosis not present

## 2014-02-17 DIAGNOSIS — E8770 Fluid overload, unspecified: Secondary | ICD-10-CM | POA: Diagnosis not present

## 2014-02-17 DIAGNOSIS — D631 Anemia in chronic kidney disease: Secondary | ICD-10-CM | POA: Diagnosis not present

## 2014-02-21 DIAGNOSIS — N186 End stage renal disease: Secondary | ICD-10-CM | POA: Diagnosis not present

## 2014-02-21 DIAGNOSIS — D631 Anemia in chronic kidney disease: Secondary | ICD-10-CM | POA: Diagnosis not present

## 2014-02-21 DIAGNOSIS — E8770 Fluid overload, unspecified: Secondary | ICD-10-CM | POA: Diagnosis not present

## 2014-02-22 ENCOUNTER — Encounter: Payer: Self-pay | Admitting: Podiatry

## 2014-02-22 ENCOUNTER — Ambulatory Visit (INDEPENDENT_AMBULATORY_CARE_PROVIDER_SITE_OTHER): Payer: Medicare Other | Admitting: Podiatry

## 2014-02-22 VITALS — BP 180/108 | HR 77 | Resp 18

## 2014-02-22 DIAGNOSIS — L603 Nail dystrophy: Secondary | ICD-10-CM | POA: Diagnosis not present

## 2014-02-22 DIAGNOSIS — B351 Tinea unguium: Secondary | ICD-10-CM | POA: Diagnosis not present

## 2014-02-22 DIAGNOSIS — I208 Other forms of angina pectoris: Secondary | ICD-10-CM | POA: Diagnosis not present

## 2014-02-22 DIAGNOSIS — L600 Ingrowing nail: Secondary | ICD-10-CM | POA: Diagnosis not present

## 2014-02-22 DIAGNOSIS — M79676 Pain in unspecified toe(s): Secondary | ICD-10-CM

## 2014-02-22 MED ORDER — CEPHALEXIN 500 MG PO CAPS: 500.0000 mg | ORAL_CAPSULE | Freq: Three times a day (TID) | ORAL | Status: DC

## 2014-02-22 NOTE — Patient Instructions (Addendum)
Soak Instructions    THE DAY AFTER THE PROCEDURE  Place 1/4 cup of epsom salts in a quart of warm tap water.  Submerge your foot or feet with outer bandage intact for the initial soak; this will allow the bandage to become moist and wet for easy lift off.  Once you remove your bandage, continue to soak in the solution for 20 minutes.  This soak should be done twice a day.  Next, remove your foot or feet from solution, blot dry the affected area and cover.  You may use a band aid large enough to cover the area or use gauze and tape.  Apply other medications to the area as directed by the doctor such as polysporin neosporin.  IF YOUR SKIN BECOMES IRRITATED WHILE USING THESE INSTRUCTIONS, IT IS OKAY TO SWITCH TO  WHITE VINEGAR AND WATER. Or you may use antibacterial soap and water to keep the toe clean  Monitor for any signs/symptoms of infection. Call the office immediately if any occur or go directly to the emergency room. Call with any questions/concerns.  Onychomycosis/Fungal Toenails  WHAT IS IT? An infection that lies within the keratin of your nail plate that is caused by a fungus.  WHY ME? Fungal infections affect all ages, sexes, races, and creeds.  There may be many factors that predispose you to a fungal infection such as age, coexisting medical conditions such as diabetes, or an autoimmune disease; stress, medications, fatigue, genetics, etc.  Bottom line: fungus thrives in a warm, moist environment and your shoes offer such a location.  IS IT CONTAGIOUS? Theoretically, yes.  You do not want to share shoes, nail clippers or files with someone who has fungal toenails.  Walking around barefoot in the same room or sleeping in the same bed is unlikely to transfer the organism.  It is important to realize, however, that fungus can spread easily from one nail to the next on the same foot.  HOW DO WE TREAT THIS?  There are several ways to treat this condition.  Treatment may depend on many  factors such as age, medications, pregnancy, liver and kidney conditions, etc.  It is best to ask your doctor which options are available to you.  1. No treatment.   Unlike many other medical concerns, you can live with this condition.  However for many people this can be a painful condition and may lead to ingrown toenails or a bacterial infection.  It is recommended that you keep the nails cut short to help reduce the amount of fungal nail. 2. Topical treatment.  These range from herbal remedies to prescription strength nail lacquers.  About 40-50% effective, topicals require twice daily application for approximately 9 to 12 months or until an entirely new nail has grown out.  The most effective topicals are medical grade medications available through physicians offices. 3. Oral antifungal medications.  With an 80-90% cure rate, the most common oral medication requires 3 to 4 months of therapy and stays in your system for a year as the new nail grows out.  Oral antifungal medications do require blood work to make sure it is a safe drug for you.  A liver function panel will be performed prior to starting the medication and after the first month of treatment.  It is important to have the blood work performed to avoid any harmful side effects.  In general, this medication safe but blood work is required. 4. Laser Therapy.  This treatment is performed by   applying a specialized laser to the affected nail plate.  This therapy is noninvasive, fast, and non-painful.  It is not covered by insurance and is therefore, out of pocket.  The results have been very good with a 80-95% cure rate.  The [facility] is the only practice in the area to offer this therapy. 5. Permanent Nail Avulsion.  Removing the entire nail so that a new nail will not grow back. 

## 2014-02-22 NOTE — Progress Notes (Signed)
Subjective:    Patient ID: [name], male    DOB: 03/02/1961, 53 y.o.   MRN: [id number]  HPI  53 year old male presents the office today with complaints of ingrown toenail on his right big toe as well as for painful thick elongated toenails. He states that the nails are thick and he is unable to trim them the nails become painful particularly with shoe gear. He states that he has pain on the nail borders on the right big toe. He has noticed a little bit of swelling to the area however denies any erythema or any purulence/drainage. No other complaints at this time. He has had no prior treatment.   Review of Systems  Constitutional:       Sweating  Gastrointestinal: Positive for constipation.  Endocrine: Positive for cold intolerance.  Allergic/Immunologic: Positive for food allergies.  Hematological: Bruises/bleeds easily.  All other systems reviewed and are negative.      Objective:   Physical Exam AAO 3, NAD DP/PT pulses palpable bilaterally, CRT less than 3 seconds Protective sensation intact with Simms Weinstein monofilament, vibratory sensation intact, Achilles tendon reflex intact. Nails hypertrophic, dystrophic, brittle, elongated, discolored 10. The right hallux nail is significantly ingrown on both the medial and lateral nail borders and is severely hypertrophic compared to the other nails. There is tenderness directly over the nail and along the nail borders. There is no drainage or purulence identified. No surrounding erythema or ascending cellulitis. Mild surrounding edema around the nail borders. No open lesions or pre-ulcerative lesions. No pain with calf compression, swelling, warmth, erythema. MMT 5/5, ROM WNL       Assessment & Plan:  53 year old male with onychodystrophy, likely onychomycosis; right hallux ingrown toenail -Treatment options were discussed including alternatives, risks, complications. -Nails were sharply debrided 10 without  complications. Upon debridement of the right hallux nail the patient continued to have symptoms after debridement and there continues to be significant ingrowing along the proximal medial and lateral nail borders with discomfort. At this time discussed the patient total nail avulsion due to pain within the nail. Risks of the procedure were discussed with the patient which include, but not limited to, infection, delayed/nonhealing, loss of toe, pain. Patient understands the risks and wishes to proceed with a nail avulsion. Verbal consent was obtained. Under sterile conditions a total of 2.5 mL of a one-to-one mixture of 2% lidocaine plain and 0.5% Marcaine plain was infiltrated in a hallux block fashion. Once anesthetized the skin was prepped in a sterile fashion. Next the hallux nail was then excised in nature to remove all offending nail borders. There is found to be an extensive amount of ingrowing on both the medial and lateral nail borders. There is a small amount of serous drainage during the procedure. After the nail was removed no further serous/purulent drainage identified and the underlying skin was intact. The area was then copiously irrigated, hemostasis was achieved. Silvadene was applied followed by dry sterile dressing. At the conclusion of the procedure there is found to be an immediate capillary refill time to the digit. Patient tolerated the procedure well without any complications. Post procedure instructions were discussed the patient for which she verbally understood. Prescribed Keflex. Monitoring clinical signs or symptoms of infection and directed to call the office immediately should any occur or go to the emergency room. -[facility] which was [facility] onych[facility]osis. -Follow-up in 1 week to recheck the nail site or sooner if  any problems are to arise. In the meantime, call the office with any questions, concerns, change in symptoms. Follow-up with PCP  for other issues mentioned in the ROS.

## 2014-02-23 ENCOUNTER — Encounter (HOSPITAL_COMMUNITY): Payer: Self-pay | Admitting: Cardiovascular Disease

## 2014-02-23 DIAGNOSIS — N186 End stage renal disease: Secondary | ICD-10-CM | POA: Diagnosis not present

## 2014-02-23 DIAGNOSIS — D631 Anemia in chronic kidney disease: Secondary | ICD-10-CM | POA: Diagnosis not present

## 2014-02-23 DIAGNOSIS — E8770 Fluid overload, unspecified: Secondary | ICD-10-CM | POA: Diagnosis not present

## 2014-02-25 DIAGNOSIS — N186 End stage renal disease: Secondary | ICD-10-CM | POA: Diagnosis not present

## 2014-02-25 DIAGNOSIS — D631 Anemia in chronic kidney disease: Secondary | ICD-10-CM | POA: Diagnosis not present

## 2014-02-25 DIAGNOSIS — E8770 Fluid overload, unspecified: Secondary | ICD-10-CM | POA: Diagnosis not present

## 2014-02-28 DIAGNOSIS — D631 Anemia in chronic kidney disease: Secondary | ICD-10-CM | POA: Diagnosis not present

## 2014-02-28 DIAGNOSIS — N186 End stage renal disease: Secondary | ICD-10-CM | POA: Diagnosis not present

## 2014-02-28 DIAGNOSIS — E8770 Fluid overload, unspecified: Secondary | ICD-10-CM | POA: Diagnosis not present

## 2014-03-01 ENCOUNTER — Encounter: Payer: Self-pay | Admitting: Podiatry

## 2014-03-01 ENCOUNTER — Ambulatory Visit (INDEPENDENT_AMBULATORY_CARE_PROVIDER_SITE_OTHER): Payer: Medicare Other | Admitting: Podiatry

## 2014-03-01 VITALS — BP 152/86 | HR 76 | Resp 18

## 2014-03-01 DIAGNOSIS — L600 Ingrowing nail: Secondary | ICD-10-CM

## 2014-03-01 DIAGNOSIS — L603 Nail dystrophy: Secondary | ICD-10-CM

## 2014-03-01 NOTE — Patient Instructions (Signed)
Continue soaking in epsom salts twice a day followed by antibiotic ointment and a band-aid. Can leave uncovered at night.  Follow-up in 2 weeks if the site has not completely healed.  Monitor for any signs/symptoms of infection. Call the office immediately if any occur or go directly to the emergency room. Call with any questions/concerns.

## 2014-03-02 DIAGNOSIS — N186 End stage renal disease: Secondary | ICD-10-CM | POA: Diagnosis not present

## 2014-03-02 DIAGNOSIS — E8770 Fluid overload, unspecified: Secondary | ICD-10-CM | POA: Diagnosis not present

## 2014-03-02 DIAGNOSIS — D631 Anemia in chronic kidney disease: Secondary | ICD-10-CM | POA: Diagnosis not present

## 2014-03-03 ENCOUNTER — Encounter: Payer: Self-pay | Admitting: Podiatry

## 2014-03-03 DIAGNOSIS — E8770 Fluid overload, unspecified: Secondary | ICD-10-CM | POA: Diagnosis not present

## 2014-03-03 DIAGNOSIS — D631 Anemia in chronic kidney disease: Secondary | ICD-10-CM | POA: Diagnosis not present

## 2014-03-03 DIAGNOSIS — N186 End stage renal disease: Secondary | ICD-10-CM | POA: Diagnosis not present

## 2014-03-03 NOTE — Progress Notes (Signed)
Patient ID: [name], male   DOB: 10/09/1960, 53 y.o.   MRN: [id number]  Subjective: 53 year old male returns the office they for follow-up evaluation status post right hallux nail removal secondary to onychodystrophy and ingrown toenail. The patient states he is doing well the any complaints and is healing well. He has been continuing the Keflex. He states his been soaking the foot twice a day Epson salts all by antibiotic ointment and a Band-Aid. He denies any drainage or purulence in the area or any red streaking. Denies any systemic complaints as fevers, chills, nausea, vomiting. No other complaints at this time in no acute changes since last appointment.  Objective: AAO 3, NAD DP/PT pulses palpable bilaterally, CRT less than 3 seconds Protective sensation intact with [name] monofilament Right hallux nail procedure site is healing well for this timeframe. Small amount of granulation tissue within the procedure site nailbed. There is no swelling erythema or ascending cellulitis. There is no drainage or purulence identified. There is no tenderness to palpation overlying the procedure site. No clinical signs of infection. No pain with calf compression, swelling, warmth, erythema.  Assessment: 53 year old male 1 week status post right hallux nail removal  Plan: -Treatment options were discussed with the patient including alternatives, risks, complications. -Continue soaking the foot twice a day Epson salts covering with antibiotic ointment and a Band-Aid into the area has completely healed. Can leave the area uncovered at night. Continue this until the area has completely healed. Continue to monitor for any clinical signs or symptoms of infection and directed to call the office immediately should any occur or go to the emergency room. -Discussed with the patient to follow-up in 2 weeks if the area has not completely healed or sooner if any problems are to arise or any change in  symptoms. Otherwise, follow-up as needed. In the meantime, call the office with any questions, concerns, change in symptoms.

## 2014-03-07 ENCOUNTER — Other Ambulatory Visit: Payer: Self-pay | Admitting: Internal Medicine

## 2014-03-07 DIAGNOSIS — E8770 Fluid overload, unspecified: Secondary | ICD-10-CM | POA: Diagnosis not present

## 2014-03-07 DIAGNOSIS — N186 End stage renal disease: Secondary | ICD-10-CM | POA: Diagnosis not present

## 2014-03-07 DIAGNOSIS — D631 Anemia in chronic kidney disease: Secondary | ICD-10-CM | POA: Diagnosis not present

## 2014-03-08 DIAGNOSIS — D631 Anemia in chronic kidney disease: Secondary | ICD-10-CM | POA: Diagnosis not present

## 2014-03-08 DIAGNOSIS — N186 End stage renal disease: Secondary | ICD-10-CM | POA: Diagnosis not present

## 2014-03-08 DIAGNOSIS — E8770 Fluid overload, unspecified: Secondary | ICD-10-CM | POA: Diagnosis not present

## 2014-03-09 DIAGNOSIS — N186 End stage renal disease: Secondary | ICD-10-CM | POA: Diagnosis not present

## 2014-03-09 DIAGNOSIS — E8770 Fluid overload, unspecified: Secondary | ICD-10-CM | POA: Diagnosis not present

## 2014-03-09 DIAGNOSIS — D631 Anemia in chronic kidney disease: Secondary | ICD-10-CM | POA: Diagnosis not present

## 2014-03-12 DIAGNOSIS — N186 End stage renal disease: Secondary | ICD-10-CM | POA: Diagnosis not present

## 2014-03-12 DIAGNOSIS — E8770 Fluid overload, unspecified: Secondary | ICD-10-CM | POA: Diagnosis not present

## 2014-03-12 DIAGNOSIS — D631 Anemia in chronic kidney disease: Secondary | ICD-10-CM | POA: Diagnosis not present

## 2014-03-16 DIAGNOSIS — Z992 Dependence on renal dialysis: Secondary | ICD-10-CM | POA: Diagnosis not present

## 2014-03-16 DIAGNOSIS — N186 End stage renal disease: Secondary | ICD-10-CM | POA: Diagnosis not present

## 2014-03-21 DIAGNOSIS — E8770 Fluid overload, unspecified: Secondary | ICD-10-CM | POA: Diagnosis not present

## 2014-03-21 DIAGNOSIS — D631 Anemia in chronic kidney disease: Secondary | ICD-10-CM | POA: Diagnosis not present

## 2014-03-21 DIAGNOSIS — N186 End stage renal disease: Secondary | ICD-10-CM | POA: Diagnosis not present

## 2014-03-21 DIAGNOSIS — N2581 Secondary hyperparathyroidism of renal origin: Secondary | ICD-10-CM | POA: Diagnosis not present

## 2014-03-21 DIAGNOSIS — D509 Iron deficiency anemia, unspecified: Secondary | ICD-10-CM | POA: Diagnosis not present

## 2014-03-22 DIAGNOSIS — N2581 Secondary hyperparathyroidism of renal origin: Secondary | ICD-10-CM | POA: Diagnosis not present

## 2014-03-22 DIAGNOSIS — D509 Iron deficiency anemia, unspecified: Secondary | ICD-10-CM | POA: Diagnosis not present

## 2014-03-22 DIAGNOSIS — D631 Anemia in chronic kidney disease: Secondary | ICD-10-CM | POA: Diagnosis not present

## 2014-03-22 DIAGNOSIS — N186 End stage renal disease: Secondary | ICD-10-CM | POA: Diagnosis not present

## 2014-03-22 DIAGNOSIS — E8770 Fluid overload, unspecified: Secondary | ICD-10-CM | POA: Diagnosis not present

## 2014-03-23 DIAGNOSIS — N2581 Secondary hyperparathyroidism of renal origin: Secondary | ICD-10-CM | POA: Diagnosis not present

## 2014-03-23 DIAGNOSIS — D509 Iron deficiency anemia, unspecified: Secondary | ICD-10-CM | POA: Diagnosis not present

## 2014-03-23 DIAGNOSIS — E8770 Fluid overload, unspecified: Secondary | ICD-10-CM | POA: Diagnosis not present

## 2014-03-23 DIAGNOSIS — N186 End stage renal disease: Secondary | ICD-10-CM | POA: Diagnosis not present

## 2014-03-23 DIAGNOSIS — D631 Anemia in chronic kidney disease: Secondary | ICD-10-CM | POA: Diagnosis not present

## 2014-03-25 DIAGNOSIS — D509 Iron deficiency anemia, unspecified: Secondary | ICD-10-CM | POA: Diagnosis not present

## 2014-03-25 DIAGNOSIS — D631 Anemia in chronic kidney disease: Secondary | ICD-10-CM | POA: Diagnosis not present

## 2014-03-25 DIAGNOSIS — N186 End stage renal disease: Secondary | ICD-10-CM | POA: Diagnosis not present

## 2014-03-25 DIAGNOSIS — E8770 Fluid overload, unspecified: Secondary | ICD-10-CM | POA: Diagnosis not present

## 2014-03-25 DIAGNOSIS — N2581 Secondary hyperparathyroidism of renal origin: Secondary | ICD-10-CM | POA: Diagnosis not present

## 2014-03-28 DIAGNOSIS — E8770 Fluid overload, unspecified: Secondary | ICD-10-CM | POA: Diagnosis not present

## 2014-03-28 DIAGNOSIS — N2581 Secondary hyperparathyroidism of renal origin: Secondary | ICD-10-CM | POA: Diagnosis not present

## 2014-03-28 DIAGNOSIS — N186 End stage renal disease: Secondary | ICD-10-CM | POA: Diagnosis not present

## 2014-03-28 DIAGNOSIS — D509 Iron deficiency anemia, unspecified: Secondary | ICD-10-CM | POA: Diagnosis not present

## 2014-03-28 DIAGNOSIS — D631 Anemia in chronic kidney disease: Secondary | ICD-10-CM | POA: Diagnosis not present

## 2014-03-30 DIAGNOSIS — N2581 Secondary hyperparathyroidism of renal origin: Secondary | ICD-10-CM | POA: Diagnosis not present

## 2014-03-30 DIAGNOSIS — D509 Iron deficiency anemia, unspecified: Secondary | ICD-10-CM | POA: Diagnosis not present

## 2014-03-30 DIAGNOSIS — E8770 Fluid overload, unspecified: Secondary | ICD-10-CM | POA: Diagnosis not present

## 2014-03-30 DIAGNOSIS — N186 End stage renal disease: Secondary | ICD-10-CM | POA: Diagnosis not present

## 2014-03-30 DIAGNOSIS — D631 Anemia in chronic kidney disease: Secondary | ICD-10-CM | POA: Diagnosis not present

## 2014-03-31 DIAGNOSIS — D631 Anemia in chronic kidney disease: Secondary | ICD-10-CM | POA: Diagnosis not present

## 2014-03-31 DIAGNOSIS — N186 End stage renal disease: Secondary | ICD-10-CM | POA: Diagnosis not present

## 2014-03-31 DIAGNOSIS — D509 Iron deficiency anemia, unspecified: Secondary | ICD-10-CM | POA: Diagnosis not present

## 2014-03-31 DIAGNOSIS — E8770 Fluid overload, unspecified: Secondary | ICD-10-CM | POA: Diagnosis not present

## 2014-03-31 DIAGNOSIS — N2581 Secondary hyperparathyroidism of renal origin: Secondary | ICD-10-CM | POA: Diagnosis not present

## 2014-04-01 ENCOUNTER — Encounter (HOSPITAL_COMMUNITY): Payer: Self-pay | Admitting: Cardiology

## 2014-04-01 ENCOUNTER — Emergency Department (HOSPITAL_COMMUNITY)
Admission: EM | Admit: 2014-04-01 | Discharge: 2014-04-01 | Disposition: A | Discharge status: Home / Self Care | Payer: Medicare Other | Attending: Emergency Medicine | Admitting: Emergency Medicine

## 2014-04-01 ENCOUNTER — Emergency Department (HOSPITAL_COMMUNITY): Payer: Medicare Other

## 2014-04-01 DIAGNOSIS — Z7982 Long term (current) use of aspirin: Secondary | ICD-10-CM | POA: Insufficient documentation

## 2014-04-01 DIAGNOSIS — Z86010 Personal history of colonic polyps: Secondary | ICD-10-CM | POA: Diagnosis not present

## 2014-04-01 DIAGNOSIS — E785 Hyperlipidemia, unspecified: Secondary | ICD-10-CM | POA: Diagnosis not present

## 2014-04-01 DIAGNOSIS — R531 Weakness: Secondary | ICD-10-CM

## 2014-04-01 DIAGNOSIS — N186 End stage renal disease: Secondary | ICD-10-CM | POA: Diagnosis not present

## 2014-04-01 DIAGNOSIS — Z7902 Long term (current) use of antithrombotics/antiplatelets: Secondary | ICD-10-CM | POA: Diagnosis not present

## 2014-04-01 DIAGNOSIS — Z8659 Personal history of other mental and behavioral disorders: Secondary | ICD-10-CM | POA: Diagnosis not present

## 2014-04-01 DIAGNOSIS — Z9889 Other specified postprocedural states: Secondary | ICD-10-CM | POA: Insufficient documentation

## 2014-04-01 DIAGNOSIS — I120 Hypertensive chronic kidney disease with stage 5 chronic kidney disease or end stage renal disease: Secondary | ICD-10-CM | POA: Insufficient documentation

## 2014-04-01 DIAGNOSIS — M109 Gout, unspecified: Secondary | ICD-10-CM | POA: Insufficient documentation

## 2014-04-01 DIAGNOSIS — D631 Anemia in chronic kidney disease: Secondary | ICD-10-CM | POA: Diagnosis not present

## 2014-04-01 DIAGNOSIS — Z8719 Personal history of other diseases of the digestive system: Secondary | ICD-10-CM | POA: Diagnosis not present

## 2014-04-01 DIAGNOSIS — I2510 Atherosclerotic heart disease of native coronary artery without angina pectoris: Secondary | ICD-10-CM | POA: Insufficient documentation

## 2014-04-01 DIAGNOSIS — R251 Tremor, unspecified: Secondary | ICD-10-CM | POA: Diagnosis not present

## 2014-04-01 DIAGNOSIS — T8189XA Other complications of procedures, not elsewhere classified, initial encounter: Secondary | ICD-10-CM | POA: Diagnosis not present

## 2014-04-01 DIAGNOSIS — D649 Anemia, unspecified: Secondary | ICD-10-CM | POA: Insufficient documentation

## 2014-04-01 DIAGNOSIS — M6281 Muscle weakness (generalized): Secondary | ICD-10-CM | POA: Insufficient documentation

## 2014-04-01 DIAGNOSIS — Z9861 Coronary angioplasty status: Secondary | ICD-10-CM | POA: Diagnosis not present

## 2014-04-01 DIAGNOSIS — M19019 Primary osteoarthritis, unspecified shoulder: Secondary | ICD-10-CM | POA: Diagnosis not present

## 2014-04-01 DIAGNOSIS — G4700 Insomnia, unspecified: Secondary | ICD-10-CM | POA: Diagnosis not present

## 2014-04-01 DIAGNOSIS — Z951 Presence of aortocoronary bypass graft: Secondary | ICD-10-CM | POA: Insufficient documentation

## 2014-04-01 DIAGNOSIS — E8770 Fluid overload, unspecified: Secondary | ICD-10-CM | POA: Diagnosis not present

## 2014-04-01 DIAGNOSIS — I252 Old myocardial infarction: Secondary | ICD-10-CM | POA: Diagnosis not present

## 2014-04-01 DIAGNOSIS — Z992 Dependence on renal dialysis: Secondary | ICD-10-CM | POA: Diagnosis not present

## 2014-04-01 DIAGNOSIS — Z8673 Personal history of transient ischemic attack (TIA), and cerebral infarction without residual deficits: Secondary | ICD-10-CM | POA: Insufficient documentation

## 2014-04-01 DIAGNOSIS — D509 Iron deficiency anemia, unspecified: Secondary | ICD-10-CM | POA: Diagnosis not present

## 2014-04-01 DIAGNOSIS — Z79899 Other long term (current) drug therapy: Secondary | ICD-10-CM | POA: Insufficient documentation

## 2014-04-01 DIAGNOSIS — N2581 Secondary hyperparathyroidism of renal origin: Secondary | ICD-10-CM | POA: Diagnosis not present

## 2014-04-01 LAB — CBC
HCT: 37.4 % — ABNORMAL LOW (ref 39.0–52.0)
HEMOGLOBIN: 12.4 g/dL — AB (ref 13.0–17.0)
MCH: 31.9 pg (ref 26.0–34.0)
MCHC: 33.2 g/dL (ref 30.0–36.0)
MCV: 96.1 fL (ref 78.0–100.0)
PLATELETS: 309 10*3/uL (ref 150–400)
RBC: 3.89 MIL/uL — ABNORMAL LOW (ref 4.22–5.81)
RDW: 14.6 % (ref 11.5–15.5)
WBC: 8.3 10*3/uL (ref 4.0–10.5)

## 2014-04-01 LAB — BASIC METABOLIC PANEL WITH GFR
Anion gap: 13 (ref 5–15)
BUN: 10 mg/dL (ref 6–23)
CO2: 34 mmol/L — ABNORMAL HIGH (ref 19–32)
Calcium: 10.7 mg/dL — ABNORMAL HIGH (ref 8.4–10.5)
Chloride: 91 meq/L — ABNORMAL LOW (ref 96–112)
Creatinine, Ser: 5.1 mg/dL — ABNORMAL HIGH (ref 0.50–1.35)
GFR calc Af Amer: 14 mL/min — ABNORMAL LOW
GFR calc non Af Amer: 12 mL/min — ABNORMAL LOW
Glucose, Bld: 151 mg/dL — ABNORMAL HIGH (ref 70–99)
Potassium: 3.6 mmol/L (ref 3.5–5.1)
Sodium: 138 mmol/L (ref 135–145)

## 2014-04-01 NOTE — ED Notes (Signed)
[facility] from dialysis center. Finished treatment. Dialysis staff noted twitching in right. NO focal neuro for EMS.  124/100 pulse 78 resp 18 98% CBG 131

## 2014-04-01 NOTE — ED Notes (Signed)
Pt to department via EMS- pt reports that he was at dialysis and when they tried to stand him, he was unable to stand because his knees gave out. Pt also had a "twitching" in his right hand.

## 2014-04-01 NOTE — ED Provider Notes (Signed)
CSN: [id number]     Arrival date & time 04/01/14  1323 History   First MD Initiated Contact with Patient 04/01/14 1716     Chief Complaint  Patient presents with  . Extremity Weakness     (Consider location/radiation/quality/duration/timing/severity/associated sxs/prior Treatment) HPI  Pt presenting with c/o feeling weak and weakness in his legs after dialysis.  He denies feeling lightheaded, no vertigo.  No chest pain or palpitations.  He states he has felt similarly after dialysis in the past.  He also reports that his right hand was shaking as well.  No focal weakness.  No seizure activity.  No numbness.  Pt states he feels back to his baseline at present.  No fever/chills.  There are no other associated systemic symptoms, there are no other alleviating or modifying factors.   Past Medical History  Diagnosis Date  . ACHALASIA   . CEREBROVASCULAR ACCIDENT, HX OF   . Headache(784.0)   . HYPERLIPIDEMIA     takes crestor daily  . RENAL CALCULUS, HX OF   . RENAL INSUFFICIENCY   . H/O hiatal hernia   . CAD (coronary artery disease), 3 vessel significant disease.    . Impaired glucose tolerance   . S/P PTCA (percutaneous transluminal coronary angioplasty), 06/30/11 07/01/2011  . CKD (chronic kidney disease) stage 4, GFR 15-29 ml/min   . History of diastolic dysfunction, grade 2 by echo   . S/P coronary artery stent placement, PTCA/DES Resolute to mid-LAD via the LIMA graft, and PTCA of apical 95% stenosis 07/05/11 07/04/2011  . DEPRESSION   . Sleep apnea     SLEEP STUDY IN [place] , NO MACHINE YET  1 YEAR  . Seizures     INSULIN INDUCED  . ANEMIA-NOS   . NSTEMI (non-ST elevated myocardial infarction), 06/30/11 07/01/2011  . Stroke     93/2005/2006/2007;left sided weakness  . DEGENERATIVE JOINT DISEASE, SHOULDER     knee  . Gout     takes Uloric daily  . GERD     no meds required  . History of colon polyps   . HYPERTENSION     takes Norvasc,Catapress,Hydralazine,Imdur,and  Metoprolol daily  . Insomnia     takes Ambien nightly  . Hyperlipidemia    Past Surgical History  Procedure Laterality Date  . Tonsillectomy    . Stomach surgery  2009    achalasia  . Knee surgery  01/07/2011    right  . Insertion of dialysis catheter  04/01/2011    Procedure: INSERTION OF DIALYSIS CATHETER;  Surgeon: Nilda Simmer, MD;  Location: [facility] OR;  Service: Vascular;  Laterality: N/A;  . Cardiac catheterization    . Av fistula placement  12/02/2011    Procedure: ARTERIOVENOUS (AV) FISTULA CREATION;  Surgeon: Fransisco Hertz, MD;  Location: [facility] OR;  Service: Vascular;  Laterality: Left;  Ultrasound Guided  . Subclavian stent placement  12/20/2011  . Coronary artery bypass graft  04/01/2011    Procedure: CORONARY ARTERY BYPASS GRAFTING (CABG);  Surgeon: Kathlee Nations Suann Larry, MD;  Location: [facility] OR;  Service: Open Heart Surgery;  Laterality: N/A;  . Coronary angioplasty      06/30/11   AT [facility]  . Colonoscopy    . Hyperlipidemia    . Ligation of arteriovenous  fistula Left 06/08/2012    Procedure: LIGATION OF ARTERIOVENOUS  FISTULA;  Surgeon: Fransisco Hertz, MD;  Location: [facility] OR;  Service: Vascular;  Laterality: Left;  . Av fistula placement Left 06/08/2012  Procedure: ARTERIOVENOUS (AV) FISTULA CREATION;  Surgeon: Fransisco Hertz, MD;  Location: [facility] OR;  Service: Vascular;  Laterality: Left;  . Left heart catheterization with coronary angiogram N/A 03/28/2011    Procedure: LEFT HEART CATHETERIZATION WITH CORONARY ANGIOGRAM;  Surgeon: Lennette Bihari, MD;  Location: [facility] C[facility] LAB;  Service: Cardiovascular;  Laterality: N/A;  pending creatnine  . Left heart catheterization with coronary/graft angiogram N/A 06/30/2011    Procedure: LEFT HEART CATHETERIZATION WITH Isabel Caprice;  Surgeon: Thurmon Fair, MD;  Location: [facility] CATH LAB;  Service: Cardiovascular;  Laterality: N/A;  . Percutaneous coronary intervention-balloon only  06/30/2011    Procedure: PERCUTANEOUS CORONARY  INTERVENTION-BALLOON ONLY;  Surgeon: Thurmon Fair, MD;  Location: [facility] CATH LAB;  Service: Cardiovascular;;  . Percutaneous coronary stent intervention (pci-s) N/A 07/04/2011    Procedure: PERCUTANEOUS CORONARY STENT INTERVENTION (PCI-S);  Surgeon: Lennette Bihari, MD;  Location: [facility] CATH LAB;  Service: Cardiovascular;  Laterality: N/A;  . Arch aortogram N/A 02/19/2012    Procedure: ARCH AORTOGRAM;  Surgeon: Fransisco Hertz, MD;  Location: [facility] CATH LAB;  Service: Cardiovascular;  Laterality: N/A;   Family History  Problem Relation Age of Onset  . Hypertension Other   . Stroke Other   . Hyperlipidemia Other   . Cancer Mother   . Heart disease Mother   . Hyperlipidemia Mother   . Hypertension Mother   . Cancer Father   . Heart disease Father    History  Substance Use Topics  . Smoking status: Never Smoker   . Smokeless tobacco: Former Neurosurgeon    Types: Chew    Quit date: 03/18/2003  . Alcohol Use: No     Comment: rarely    Review of Systems  ROS reviewed and all otherwise negative except for mentioned in HPI    Allergies  Shrimp; Atorvastatin; Insulins; Simvastatin; and Ultram  Home Medications   Prior to Admission medications   Medication Sig Start Date End Date Taking? Authorizing Provider  acetaminophen (TYLENOL) 500 MG tablet Take 1,000 mg by mouth every 6 (six) hours as needed (pain).   Yes Historical Provider, MD  aspirin EC 81 MG EC tablet Take 1 tablet (81 mg total) by mouth daily. 07/07/11  Yes Dwana Melena, PA-C  cetirizine (ZYRTEC) 10 MG tablet Take 10 mg by mouth at bedtime.   Yes Historical Provider, MD  cloNIDine (CATAPRES) 0.2 MG tablet Take 0.2 mg by mouth 2 (two) times daily.  05/13/11  Yes Corwin Levins, MD  clopidogrel (PLAVIX) 75 MG tablet Take 75 mg by mouth at bedtime.    Yes Historical Provider, MD  febuxostat (ULORIC) 40 MG tablet Take 40 mg by mouth at bedtime.  05/13/11  Yes Corwin Levins, MD  hydrALAZINE (APRESOLINE) 10 MG tablet Take 10 mg by mouth 2 (two)  times daily.   Yes Historical Provider, MD  KRILL OIL PO Take 3 capsules by mouth daily.   Yes Historical Provider, MD  metoprolol (LOPRESSOR) 50 MG tablet TAKE 1 TABLET BY MOUTH TWICE A DAY 03/07/14  Yes Corwin Levins, MD  multivitamin (RENA-VIT) TABS tablet Take 1 tablet by mouth daily.   Yes Historical Provider, MD  NITROSTAT 0.4 MG SL tablet PLACE 1 TABLET UNDER TONGUE AS NEEDED FOR CHEST PAIN EVERY 5 MINUTES (MAX 3DOSES)   Yes Runell Gess, MD  oxyCODONE (ROXICODONE) 5 MG immediate release tablet Take 1 tablet (5 mg total) by mouth every 4 (four) hours as needed for pain. Patient taking differently:  Take 5 mg by mouth every 4 (four) hours as needed (pain).  06/08/12  Yes Lars Mage, PA-C  polysaccharide iron (NIFEREX) 150 MG CAPS capsule Take 150 mg by mouth at bedtime.  05/28/11  Yes Corwin Levins, MD  rosuvastatin (CRESTOR) 20 MG tablet Take 1 tablet (20 mg total) by mouth every evening. 01/18/14  Yes Corwin Levins, MD  terazosin (HYTRIN) 10 MG capsule Take 10 mg by mouth at bedtime.   Yes Historical Provider, MD  ticagrelor (BRILINTA) 90 MG TABS tablet Take 90 mg by mouth daily.   Yes Historical Provider, MD  zolpidem (AMBIEN) 10 MG tablet Take 1 tablet (10 mg total) by mouth at bedtime as needed for sleep. 02/14/14  Yes Corwin Levins, MD  cephALEXin (KEFLEX) 500 MG capsule Take 1 capsule (500 mg total) by mouth 3 (three) times daily. Patient not taking: Reported on 04/01/2014 02/22/14   Ovid Curd, DPM  isosorbide mononitrate (IMDUR) 30 MG 24 hr tablet Take 1 tablet (30 mg total) by mouth daily. Patient not taking: Reported on 04/01/2014 06/24/13   Leone Brand, NP  levofloxacin (LEVAQUIN) 250 MG tablet Take 1 tablet (250 mg total) by mouth every other day. Patient not taking: Reported on 04/01/2014 01/01/14   Esperanza Sheets, MD  ranolazine (RANEXA) 500 MG 12 hr tablet Take 1 tablet (500 mg total) by mouth every morning. Patient not taking: Reported on 04/01/2014 07/19/13 07/19/14   Runell Gess, MD  terazosin (HYTRIN) 5 MG capsule Take 1 capsule (5 mg total) by mouth at bedtime. Patient not taking: Reported on 04/01/2014 01/01/14   Esperanza Sheets, MD   BP 111/71 mmHg  Pulse 93  Temp(Src) 97.6 F (36.4 C) (Oral)  Resp 22  Ht 6' (1.829 m)  Wt 229 lb (103.874 kg)  BMI 31.05 kg/m2  SpO2 98%  Vitals reviewed Physical Exam  Physical Examination: General appearance - alert, well appearing, and in no distress Mental status - alert, oriented to person, place, and time Eyes - pupils equal and reactive, extraocular eye movements intact Mouth - mucous membranes moist, pharynx normal without lesions Neck - supple, no significant adenopathy Chest - clear to auscultation, no wheezes, rales or rhonchi, symmetric air entry Heart - normal rate, regular rhythm, normal S1, S2, no murmurs, rubs, clicks or gallops Abdomen - soft, nontender, nondistended, no masses or organomegaly Neurological - alert and oriented x 3, cranial nerves 2-12 tested and intact, strength 5/5 in extremities x 4, sensation intact Extremities - peripheral pulses normal, no pedal edema, no clubbing or cyanosis Skin - normal coloration and turgor, no rashes  ED Course  Procedures (including critical care time) Labs Review Labs Reviewed  CBC - Abnormal; Notable for the following:    RBC 3.89 (*)    Hemoglobin 12.4 (*)    HCT 37.4 (*)    All other components within normal limits  BASIC METABOLIC PANEL - Abnormal; Notable for the following:    Chloride 91 (*)    CO2 34 (*)    Glucose, Bld 151 (*)    Creatinine, Ser 5.10 (*)    Calcium 10.7 (*)    GFR calc non Af Amer 12 (*)    GFR calc Af Amer 14 (*)    All other components within normal limits    Imaging Review Ct Head Wo Contrast  04/01/2014   CLINICAL DATA:  Weakness at dialysis, unable to stand. RIGHT hand twitching. Prior stroke.  EXAM: CT HEAD WITHOUT  CONTRAST  TECHNIQUE: Contiguous axial images were obtained from the base of the skull  through the vertex without intravenous contrast.  COMPARISON:  MRI of the brain December 29, 2013  FINDINGS: No intraparenchymal hemorrhage, mass effect, midline shift or acute large vascular territory infarct. RIGHT parietal occipital encephalomalacia, unchanged. Bilateral basal ganglia cystic lacunar infarcts were present previously. Mild ex vacuo dilatation bifrontal horns of the lateral ventricles, in a background of mild global parenchymal brain volume loss for age. Mild white matter changes, including sub cm cystic changes of the RIGHT rostrum of the corpus callosum, unchanged.  No abnormal extra-axial fluid collections. Basal cisterns are patent.  Paranasal sinuses and mastoid air cells are well aerated. No skull fracture. Ocular globes and orbital contents are unremarkable.  IMPRESSION: No acute intracranial process ; if clinical concern for acute ischemia, MRI of the brain with diffusion-weighted sequences would be more sensitive.  Remote bilateral basal ganglia and RIGHT parietal occipital infarcts. Mild white matter changes, advanced for age though, similar to prior examination. Mild parenchymal brain volume loss, advanced for age.   Electronically Signed   By: Awilda Metro   On: 04/01/2014 21:15     EKG Interpretation   Date/Time:  Saturday April 01 2014 14:12:32 EST Ventricular Rate:  105 PR Interval:  142 QRS Duration: 74 QT Interval:  380 QTC Calculation: 502 R Axis:   80 Text Interpretation:  Sinus tachycardia Possible Lateral infarct , age  undetermined Abnormal ECG No significant change since last tracing  Confirmed by [facility]  MD, Willmar Stockinger [phone]) on 04/01/2014 6:03:39 PM      MDM   Final diagnoses:  Weakness  End stage renal disease on dialysis    Pt had transient episode of generalized weakness after dialysis session today.  He also had some shaking of hand.  Normal neuro exam in the ED.  Pt feels back to his baseline.  Tremor may have been due to presyncope.  Labs  reassuring and at/near his baseline.  EKG and head CT reassuring.  Discharged with strict return precautions.  Pt agreeable with plan.    Ethelda Chick, MD 04/02/14 [phone]

## 2014-04-01 NOTE — Discharge Instructions (Signed)
Return to the ED with any concerns including weakness of arms or legs, changes in vision or speech, fainting, chest pain, difficulty breathing, decreased level of alertness/lethargy, or any other alarming symptoms °

## 2014-04-04 DIAGNOSIS — D509 Iron deficiency anemia, unspecified: Secondary | ICD-10-CM | POA: Diagnosis not present

## 2014-04-04 DIAGNOSIS — N186 End stage renal disease: Secondary | ICD-10-CM | POA: Diagnosis not present

## 2014-04-04 DIAGNOSIS — E8770 Fluid overload, unspecified: Secondary | ICD-10-CM | POA: Diagnosis not present

## 2014-04-04 DIAGNOSIS — N2581 Secondary hyperparathyroidism of renal origin: Secondary | ICD-10-CM | POA: Diagnosis not present

## 2014-04-04 DIAGNOSIS — D631 Anemia in chronic kidney disease: Secondary | ICD-10-CM | POA: Diagnosis not present

## 2014-04-06 DIAGNOSIS — E8770 Fluid overload, unspecified: Secondary | ICD-10-CM | POA: Diagnosis not present

## 2014-04-06 DIAGNOSIS — D509 Iron deficiency anemia, unspecified: Secondary | ICD-10-CM | POA: Diagnosis not present

## 2014-04-06 DIAGNOSIS — N2581 Secondary hyperparathyroidism of renal origin: Secondary | ICD-10-CM | POA: Diagnosis not present

## 2014-04-06 DIAGNOSIS — D631 Anemia in chronic kidney disease: Secondary | ICD-10-CM | POA: Diagnosis not present

## 2014-04-06 DIAGNOSIS — N186 End stage renal disease: Secondary | ICD-10-CM | POA: Diagnosis not present

## 2014-04-08 DIAGNOSIS — E8770 Fluid overload, unspecified: Secondary | ICD-10-CM | POA: Diagnosis not present

## 2014-04-08 DIAGNOSIS — N186 End stage renal disease: Secondary | ICD-10-CM | POA: Diagnosis not present

## 2014-04-08 DIAGNOSIS — D509 Iron deficiency anemia, unspecified: Secondary | ICD-10-CM | POA: Diagnosis not present

## 2014-04-08 DIAGNOSIS — D631 Anemia in chronic kidney disease: Secondary | ICD-10-CM | POA: Diagnosis not present

## 2014-04-08 DIAGNOSIS — N2581 Secondary hyperparathyroidism of renal origin: Secondary | ICD-10-CM | POA: Diagnosis not present

## 2014-04-11 DIAGNOSIS — E8770 Fluid overload, unspecified: Secondary | ICD-10-CM | POA: Diagnosis not present

## 2014-04-11 DIAGNOSIS — N2581 Secondary hyperparathyroidism of renal origin: Secondary | ICD-10-CM | POA: Diagnosis not present

## 2014-04-11 DIAGNOSIS — D631 Anemia in chronic kidney disease: Secondary | ICD-10-CM | POA: Diagnosis not present

## 2014-04-11 DIAGNOSIS — N186 End stage renal disease: Secondary | ICD-10-CM | POA: Diagnosis not present

## 2014-04-11 DIAGNOSIS — D509 Iron deficiency anemia, unspecified: Secondary | ICD-10-CM | POA: Diagnosis not present

## 2014-04-13 ENCOUNTER — Encounter: Payer: Self-pay | Admitting: Internal Medicine

## 2014-04-13 ENCOUNTER — Ambulatory Visit (INDEPENDENT_AMBULATORY_CARE_PROVIDER_SITE_OTHER): Payer: Medicare Other | Admitting: Internal Medicine

## 2014-04-13 VITALS — BP 102/64 | HR 69 | Temp 98.9°F | Ht 72.0 in | Wt 221.0 lb

## 2014-04-13 DIAGNOSIS — N186 End stage renal disease: Secondary | ICD-10-CM | POA: Diagnosis not present

## 2014-04-13 DIAGNOSIS — F32A Depression, unspecified: Secondary | ICD-10-CM

## 2014-04-13 DIAGNOSIS — R29898 Other symptoms and signs involving the musculoskeletal system: Secondary | ICD-10-CM

## 2014-04-13 DIAGNOSIS — I10 Essential (primary) hypertension: Secondary | ICD-10-CM

## 2014-04-13 DIAGNOSIS — D509 Iron deficiency anemia, unspecified: Secondary | ICD-10-CM | POA: Diagnosis not present

## 2014-04-13 DIAGNOSIS — E8770 Fluid overload, unspecified: Secondary | ICD-10-CM | POA: Diagnosis not present

## 2014-04-13 DIAGNOSIS — F329 Major depressive disorder, single episode, unspecified: Secondary | ICD-10-CM

## 2014-04-13 DIAGNOSIS — N2581 Secondary hyperparathyroidism of renal origin: Secondary | ICD-10-CM | POA: Diagnosis not present

## 2014-04-13 DIAGNOSIS — Z8673 Personal history of transient ischemic attack (TIA), and cerebral infarction without residual deficits: Secondary | ICD-10-CM | POA: Diagnosis not present

## 2014-04-13 DIAGNOSIS — D631 Anemia in chronic kidney disease: Secondary | ICD-10-CM | POA: Diagnosis not present

## 2014-04-13 MED ORDER — CLOPIDOGREL BISULFATE 75 MG PO TABS: 75.0000 mg | ORAL_TABLET | Freq: Every day | ORAL | Status: DC

## 2014-04-13 NOTE — Progress Notes (Signed)
Pre visit review using our clinic review tool, if applicable. No additional management support is needed unless otherwise documented below in the visit note. 

## 2014-04-13 NOTE — Patient Instructions (Signed)
Please continue all other medications as before, and refills have been done if requested.  Please have the pharmacy call with any other refills you may need.  Please keep your appointments with your specialists as you may have planned  You will be contacted regarding the referral for: Physical therapy at your home  Please return in 6 months, or sooner if needed

## 2014-04-13 NOTE — Progress Notes (Signed)
Subjective:    Patient ID: [name], male    DOB: 01/16/1961, 54 y.o.   MRN: [id number]  HPI  Here to f/u with hx of CVA and left sided weakness and difficulty with ambulation, essentially no change but just wanted exam.  Has been seen per neurology for same Nov 12, cont'd on plavix/asa and for f/u at 3-4 mo.  Pt without bowel or bladder change, fever, wt loss,  worsening LE pain/numbness/weakness, gait change or falls. Has ESRD, hx of poorly controlled HTN.   Pt denies fever, wt loss, night sweats, loss of appetite, or other constitutional symptoms  Denies worsening depressive symptoms, suicidal ideation, or panic Past Medical History  Diagnosis Date  . ACHALASIA   . CEREBROVASCULAR ACCIDENT, HX OF   . Headache(784.0)   . HYPERLIPIDEMIA     takes crestor daily  . RENAL CALCULUS, HX OF   . RENAL INSUFFICIENCY   . H/O hiatal hernia   . CAD (coronary artery disease), 3 vessel significant disease.    . Impaired glucose tolerance   . S/P PTCA (percutaneous transluminal coronary angioplasty), 06/30/11 07/01/2011  . CKD (chronic kidney disease) stage 4, GFR 15-29 ml/min   . History of diastolic dysfunction, grade 2 by echo   . S/P coronary artery stent placement, PTCA/DES Resolute to mid-LAD via the LIMA graft, and PTCA of apical 95% stenosis 07/05/11 07/04/2011  . DEPRESSION   . Sleep apnea     SLEEP STUDY IN [place] , NO MACHINE YET  1 YEAR  . Seizures     INSULIN INDUCED  . ANEMIA-NOS   . NSTEMI (non-ST elevated myocardial infarction), 06/30/11 07/01/2011  . Stroke     93/2005/2006/2007;left sided weakness  . DEGENERATIVE JOINT DISEASE, SHOULDER     knee  . Gout     takes Uloric daily  . GERD     no meds required  . History of colon polyps   . HYPERTENSION     takes Norvasc,Catapress,Hydralazine,Imdur,and Metoprolol daily  . Insomnia     takes Ambien nightly  . Hyperlipidemia    Past Surgical History  Procedure Laterality Date  . Tonsillectomy    . Stomach surgery  2009      achalasia  . Knee surgery  01/07/2011    right  . Insertion of dialysis catheter  04/01/2011    Procedure: INSERTION OF DIALYSIS CATHETER;  Surgeon: Nilda Simmer, MD;  Location: [facility] OR;  Service: Vascular;  Laterality: N/A;  . Cardiac catheterization    . Av fistula placement  12/02/2011    Procedure: ARTERIOVENOUS (AV) FISTULA CREATION;  Surgeon: Fransisco Hertz, MD;  Location: [facility] OR;  Service: Vascular;  Laterality: Left;  Ultrasound Guided  . Subclavian stent placement  12/20/2011  . Coronary artery bypass graft  04/01/2011    Procedure: CORONARY ARTERY BYPASS GRAFTING (CABG);  Surgeon: Kathlee Nations Suann Larry, MD;  Location: [facility] OR;  Service: Open Heart Surgery;  Laterality: N/A;  . Coronary angioplasty      06/30/11   AT [facility]  . Colonoscopy    . Hyperlipidemia    . Ligation of arteriovenous  fistula Left 06/08/2012    Procedure: LIGATION OF ARTERIOVENOUS  FISTULA;  Surgeon: Fransisco Hertz, MD;  Location: [facility] OR;  Service: Vascular;  Laterality: Left;  . Av fistula placement Left 06/08/2012    Procedure: ARTERIOVENOUS (AV) FISTULA CREATION;  Surgeon: Fransisco Hertz, MD;  Location: [facility] OR;  Service: Vascular;  Laterality: Left;  . Left  heart catheterization with coronary angiogram N/A 03/28/2011    Procedure: LEFT HEART CATHETERIZATION WITH CORONARY ANGIOGRAM;  Surgeon: Lennette Bihari, MD;  Location: [facility] CATH LAB;  Service: Cardiovascular;  Laterality: N/A;  pending creatnine  . Left heart catheterization with coronary/graft angiogram N/A 06/30/2011    Procedure: LEFT HEART CATHETERIZATION WITH Isabel Caprice;  Surgeon: Thurmon Fair, MD;  Location: [facility] CATH LAB;  Service: Cardiovascular;  Laterality: N/A;  . Percutaneous coronary intervention-balloon only  06/30/2011    Procedure: PERCUTANEOUS CORONARY INTERVENTION-BALLOON ONLY;  Surgeon: Thurmon Fair, MD;  Location: [facility] CATH LAB;  Service: Cardiovascular;;  . Percutaneous coronary stent intervention (pci-s) N/A 07/04/2011    Procedure:  PERCUTANEOUS CORONARY STENT INTERVENTION (PCI-S);  Surgeon: Lennette Bihari, MD;  Location: [facility] CATH LAB;  Service: Cardiovascular;  Laterality: N/A;  . Arch aortogram N/A 02/19/2012    Procedure: ARCH AORTOGRAM;  Surgeon: Fransisco Hertz, MD;  Location: [facility] CATH LAB;  Service: Cardiovascular;  Laterality: N/A;    reports that he has never smoked. He quit smokeless tobacco use about 11 years ago. His smokeless tobacco use included Chew. He reports that he does not drink alcohol or use illicit drugs. family history includes Cancer in his father and mother; Heart disease in his father and mother; Hyperlipidemia in his mother and other; Hypertension in his mother and other; Stroke in his other. Allergies  Allergen Reactions  . Shrimp [Shellfish Allergy] Shortness Of Breath  . Atorvastatin Other (See Comments)    weakness  . Insulins Other (See Comments)    Patient unsure as to the kind of insulin but states that it has previously caused seizures. This occurred in the hospital in 2006; but in most recent hospitalization (Jan 2013) received insulin but did not have reaction  . Simvastatin Other (See Comments)     abnormal liver tests  . Ultram [Tramadol] Other (See Comments)    Liver enzyme    Current Outpatient Prescriptions on File Prior to Visit  Medication Sig Dispense Refill  . acetaminophen (TYLENOL) 500 MG tablet Take 1,000 mg by mouth every 6 (six) hours as needed (pain).    Marland Kitchen aspirin EC 81 MG EC tablet Take 1 tablet (81 mg total) by mouth daily.    . cephALEXin (KEFLEX) 500 MG capsule Take 1 capsule (500 mg total) by mouth 3 (three) times daily. 21 capsule 2  . cetirizine (ZYRTEC) 10 MG tablet Take 10 mg by mouth at bedtime.    . cloNIDine (CATAPRES) 0.2 MG tablet Take 0.2 mg by mouth 2 (two) times daily.     . febuxostat (ULORIC) 40 MG tablet Take 40 mg by mouth at bedtime.     . hydrALAZINE (APRESOLINE) 10 MG tablet Take 10 mg by mouth 2 (two) times daily.    . isosorbide mononitrate  (IMDUR) 30 MG 24 hr tablet Take 1 tablet (30 mg total) by mouth daily. 30 tablet 6  . KRILL OIL PO Take 3 capsules by mouth daily.    Marland Kitchen levofloxacin (LEVAQUIN) 250 MG tablet Take 1 tablet (250 mg total) by mouth every other day. 4 tablet 0  . metoprolol (LOPRESSOR) 50 MG tablet TAKE 1 TABLET BY MOUTH TWICE A DAY 180 tablet 3  . multivitamin (RENA-VIT) TABS tablet Take 1 tablet by mouth daily.    Marland Kitchen NITROSTAT 0.4 MG SL tablet PLACE 1 TABLET UNDER TONGUE AS NEEDED FOR CHEST PAIN EVERY 5 MINUTES (MAX 3DOSES) 25 tablet 4  . oxyCODONE (ROXICODONE) 5 MG immediate release tablet Take  1 tablet (5 mg total) by mouth every 4 (four) hours as needed for pain. (Patient taking differently: Take 5 mg by mouth every 4 (four) hours as needed (pain). ) 30 tablet 0  . polysaccharide iron (NIFEREX) 150 MG CAPS capsule Take 150 mg by mouth at bedtime.     . ranolazine (RANEXA) 500 MG 12 hr tablet Take 1 tablet (500 mg total) by mouth every morning. 90 tablet 1  . rosuvastatin (CRESTOR) 20 MG tablet Take 1 tablet (20 mg total) by mouth every evening. 90 tablet 3  . terazosin (HYTRIN) 10 MG capsule Take 10 mg by mouth at bedtime.    Marland Kitchen terazosin (HYTRIN) 5 MG capsule Take 1 capsule (5 mg total) by mouth at bedtime. 30 capsule 1  . ticagrelor (BRILINTA) 90 MG TABS tablet Take 90 mg by mouth daily.    Marland Kitchen zolpidem (AMBIEN) 10 MG tablet Take 1 tablet (10 mg total) by mouth at bedtime as needed for sleep. 30 tablet 5   No current facility-administered medications on file prior to visit.   Review of Systems  Constitutional: Negative for unusual diaphoresis or other sweats  HENT: Negative for ringing in ear Eyes: Negative for double vision or worsening visual disturbance.  Respiratory: Negative for choking and stridor.   Gastrointestinal: Negative for vomiting or other signifcant bowel change Genitourinary: Negative for hematuria or decreased urine volume.  Musculoskeletal: Negative for other MSK pain or swelling Skin:  Negative for color change and worsening wound.  Neurological: Negative for tremors and numbness other than noted  Psychiatric/Behavioral: Negative for decreased concentration or agitation other than above       Objective:   Physical Exam BP 102/64 mmHg  Pulse 69  Temp(Src) 98.9 F (37.2 C) (Oral)  Ht 6' (1.829 m)  Wt 221 lb (100.245 kg)  BMI 29.97 kg/m2  SpO2 99% VS noted,  Constitutional: Pt appears well-developed, well-nourished.  HENT: Head: NCAT.  Right Ear: External ear normal.  Left Ear: External ear normal.  Eyes: . Pupils are equal, round, and reactive to light. Conjunctivae and EOM are normal Neck: Normal range of motion. Neck supple.  Cardiovascular: Normal rate and regular rhythm.   Pulmonary/Chest: Effort normal and breath sounds without rales or wheezing.  Abd:  Soft, NT, ND, + BS Neurological: Pt is alert. Not confused , motor with LUE 4+/5, LLE 4/5 Skin: Skin is warm. No rash Psychiatric: Pt behavior is normal. No agitation. mild dysphoric    Assessment & Plan:

## 2014-04-14 DIAGNOSIS — N186 End stage renal disease: Secondary | ICD-10-CM | POA: Diagnosis not present

## 2014-04-14 DIAGNOSIS — E8770 Fluid overload, unspecified: Secondary | ICD-10-CM | POA: Diagnosis not present

## 2014-04-15 DIAGNOSIS — Z8673 Personal history of transient ischemic attack (TIA), and cerebral infarction without residual deficits: Secondary | ICD-10-CM | POA: Insufficient documentation

## 2014-04-15 DIAGNOSIS — N2581 Secondary hyperparathyroidism of renal origin: Secondary | ICD-10-CM | POA: Diagnosis not present

## 2014-04-15 DIAGNOSIS — D509 Iron deficiency anemia, unspecified: Secondary | ICD-10-CM | POA: Diagnosis not present

## 2014-04-15 DIAGNOSIS — N186 End stage renal disease: Secondary | ICD-10-CM | POA: Diagnosis not present

## 2014-04-15 DIAGNOSIS — D631 Anemia in chronic kidney disease: Secondary | ICD-10-CM | POA: Diagnosis not present

## 2014-04-15 DIAGNOSIS — E8770 Fluid overload, unspecified: Secondary | ICD-10-CM | POA: Diagnosis not present

## 2014-04-15 NOTE — Assessment & Plan Note (Signed)
I suspect no significant change from previous, exam d/w pt, liekly related to prior cva, to cont asa/plavix,  to f/u any worsening symptoms or concerns

## 2014-04-15 NOTE — Assessment & Plan Note (Signed)
Suspect mild worsening, pt declines further change in tx at this time,  to f/u any worsening symptoms or concerns

## 2014-04-15 NOTE — Assessment & Plan Note (Addendum)
Though possibly exac by hypotension with dialysis,  to f/u any worsening symptoms or concerns, stable, cont current tx, for [facility] PT -

## 2014-04-15 NOTE — Assessment & Plan Note (Signed)
stable overall by history and exam, recent data reviewed with pt, and pt to continue medical treatment as before,  to f/u any worsening symptoms or concerns BP Readings from Last 3 Encounters:  04/13/14 102/64  04/01/14 111/71  03/01/14 152/86

## 2014-04-16 DIAGNOSIS — N186 End stage renal disease: Secondary | ICD-10-CM | POA: Diagnosis not present

## 2014-04-16 DIAGNOSIS — Z992 Dependence on renal dialysis: Secondary | ICD-10-CM | POA: Diagnosis not present

## 2014-04-18 DIAGNOSIS — N186 End stage renal disease: Secondary | ICD-10-CM | POA: Diagnosis not present

## 2014-04-18 DIAGNOSIS — N2581 Secondary hyperparathyroidism of renal origin: Secondary | ICD-10-CM | POA: Diagnosis not present

## 2014-04-18 DIAGNOSIS — D631 Anemia in chronic kidney disease: Secondary | ICD-10-CM | POA: Diagnosis not present

## 2014-04-18 DIAGNOSIS — D509 Iron deficiency anemia, unspecified: Secondary | ICD-10-CM | POA: Diagnosis not present

## 2014-04-19 DIAGNOSIS — I871 Compression of vein: Secondary | ICD-10-CM | POA: Diagnosis not present

## 2014-04-19 DIAGNOSIS — T82858D Stenosis of vascular prosthetic devices, implants and grafts, subsequent encounter: Secondary | ICD-10-CM | POA: Diagnosis not present

## 2014-04-19 DIAGNOSIS — N186 End stage renal disease: Secondary | ICD-10-CM | POA: Diagnosis not present

## 2014-04-19 DIAGNOSIS — Z992 Dependence on renal dialysis: Secondary | ICD-10-CM | POA: Diagnosis not present

## 2014-04-20 DIAGNOSIS — D631 Anemia in chronic kidney disease: Secondary | ICD-10-CM | POA: Diagnosis not present

## 2014-04-20 DIAGNOSIS — N2581 Secondary hyperparathyroidism of renal origin: Secondary | ICD-10-CM | POA: Diagnosis not present

## 2014-04-20 DIAGNOSIS — N186 End stage renal disease: Secondary | ICD-10-CM | POA: Diagnosis not present

## 2014-04-20 DIAGNOSIS — D509 Iron deficiency anemia, unspecified: Secondary | ICD-10-CM | POA: Diagnosis not present

## 2014-04-22 DIAGNOSIS — N2581 Secondary hyperparathyroidism of renal origin: Secondary | ICD-10-CM | POA: Diagnosis not present

## 2014-04-22 DIAGNOSIS — D631 Anemia in chronic kidney disease: Secondary | ICD-10-CM | POA: Diagnosis not present

## 2014-04-22 DIAGNOSIS — D509 Iron deficiency anemia, unspecified: Secondary | ICD-10-CM | POA: Diagnosis not present

## 2014-04-22 DIAGNOSIS — N186 End stage renal disease: Secondary | ICD-10-CM | POA: Diagnosis not present

## 2014-04-25 DIAGNOSIS — N2581 Secondary hyperparathyroidism of renal origin: Secondary | ICD-10-CM | POA: Diagnosis not present

## 2014-04-25 DIAGNOSIS — D509 Iron deficiency anemia, unspecified: Secondary | ICD-10-CM | POA: Diagnosis not present

## 2014-04-25 DIAGNOSIS — N186 End stage renal disease: Secondary | ICD-10-CM | POA: Diagnosis not present

## 2014-04-25 DIAGNOSIS — D631 Anemia in chronic kidney disease: Secondary | ICD-10-CM | POA: Diagnosis not present

## 2014-04-27 DIAGNOSIS — D509 Iron deficiency anemia, unspecified: Secondary | ICD-10-CM | POA: Diagnosis not present

## 2014-04-27 DIAGNOSIS — N2581 Secondary hyperparathyroidism of renal origin: Secondary | ICD-10-CM | POA: Diagnosis not present

## 2014-04-27 DIAGNOSIS — D631 Anemia in chronic kidney disease: Secondary | ICD-10-CM | POA: Diagnosis not present

## 2014-04-27 DIAGNOSIS — N186 End stage renal disease: Secondary | ICD-10-CM | POA: Diagnosis not present

## 2014-04-29 DIAGNOSIS — N186 End stage renal disease: Secondary | ICD-10-CM | POA: Diagnosis not present

## 2014-05-01 ENCOUNTER — Ambulatory Visit: Payer: Medicare Other | Admitting: Neurology

## 2014-05-02 DIAGNOSIS — N186 End stage renal disease: Secondary | ICD-10-CM | POA: Diagnosis not present

## 2014-05-03 ENCOUNTER — Encounter: Payer: Self-pay | Admitting: Podiatry

## 2014-05-04 DIAGNOSIS — N186 End stage renal disease: Secondary | ICD-10-CM | POA: Diagnosis not present

## 2014-05-06 DIAGNOSIS — N186 End stage renal disease: Secondary | ICD-10-CM | POA: Diagnosis not present

## 2014-05-09 DIAGNOSIS — D631 Anemia in chronic kidney disease: Secondary | ICD-10-CM | POA: Diagnosis not present

## 2014-05-09 DIAGNOSIS — D509 Iron deficiency anemia, unspecified: Secondary | ICD-10-CM | POA: Diagnosis not present

## 2014-05-09 DIAGNOSIS — N186 End stage renal disease: Secondary | ICD-10-CM | POA: Diagnosis not present

## 2014-05-09 DIAGNOSIS — N2581 Secondary hyperparathyroidism of renal origin: Secondary | ICD-10-CM | POA: Diagnosis not present

## 2014-05-11 DIAGNOSIS — D631 Anemia in chronic kidney disease: Secondary | ICD-10-CM | POA: Diagnosis not present

## 2014-05-11 DIAGNOSIS — D509 Iron deficiency anemia, unspecified: Secondary | ICD-10-CM | POA: Diagnosis not present

## 2014-05-11 DIAGNOSIS — N2581 Secondary hyperparathyroidism of renal origin: Secondary | ICD-10-CM | POA: Diagnosis not present

## 2014-05-11 DIAGNOSIS — N186 End stage renal disease: Secondary | ICD-10-CM | POA: Diagnosis not present

## 2014-05-13 DIAGNOSIS — N2581 Secondary hyperparathyroidism of renal origin: Secondary | ICD-10-CM | POA: Diagnosis not present

## 2014-05-13 DIAGNOSIS — D631 Anemia in chronic kidney disease: Secondary | ICD-10-CM | POA: Diagnosis not present

## 2014-05-13 DIAGNOSIS — D509 Iron deficiency anemia, unspecified: Secondary | ICD-10-CM | POA: Diagnosis not present

## 2014-05-13 DIAGNOSIS — N186 End stage renal disease: Secondary | ICD-10-CM | POA: Diagnosis not present

## 2014-05-15 DIAGNOSIS — N186 End stage renal disease: Secondary | ICD-10-CM | POA: Diagnosis not present

## 2014-05-15 DIAGNOSIS — Z992 Dependence on renal dialysis: Secondary | ICD-10-CM | POA: Diagnosis not present

## 2014-05-16 ENCOUNTER — Ambulatory Visit: Payer: Self-pay | Admitting: Neurology

## 2014-05-16 DIAGNOSIS — N186 End stage renal disease: Secondary | ICD-10-CM | POA: Diagnosis not present

## 2014-05-16 DIAGNOSIS — N2581 Secondary hyperparathyroidism of renal origin: Secondary | ICD-10-CM | POA: Diagnosis not present

## 2014-05-16 DIAGNOSIS — D509 Iron deficiency anemia, unspecified: Secondary | ICD-10-CM | POA: Diagnosis not present

## 2014-05-18 DIAGNOSIS — N2581 Secondary hyperparathyroidism of renal origin: Secondary | ICD-10-CM | POA: Diagnosis not present

## 2014-05-18 DIAGNOSIS — N186 End stage renal disease: Secondary | ICD-10-CM | POA: Diagnosis not present

## 2014-05-18 DIAGNOSIS — D509 Iron deficiency anemia, unspecified: Secondary | ICD-10-CM | POA: Diagnosis not present

## 2014-05-20 DIAGNOSIS — N2581 Secondary hyperparathyroidism of renal origin: Secondary | ICD-10-CM | POA: Diagnosis not present

## 2014-05-20 DIAGNOSIS — N186 End stage renal disease: Secondary | ICD-10-CM | POA: Diagnosis not present

## 2014-05-20 DIAGNOSIS — D509 Iron deficiency anemia, unspecified: Secondary | ICD-10-CM | POA: Diagnosis not present

## 2014-05-22 ENCOUNTER — Telehealth: Payer: Self-pay | Admitting: Internal Medicine

## 2014-05-22 NOTE — Telephone Encounter (Signed)
Pt called in said that she needs someone to call [name] so he can stay on the Transplant list   [phone]

## 2014-05-23 DIAGNOSIS — N2581 Secondary hyperparathyroidism of renal origin: Secondary | ICD-10-CM | POA: Diagnosis not present

## 2014-05-23 DIAGNOSIS — D509 Iron deficiency anemia, unspecified: Secondary | ICD-10-CM | POA: Diagnosis not present

## 2014-05-23 DIAGNOSIS — N186 End stage renal disease: Secondary | ICD-10-CM | POA: Diagnosis not present

## 2014-05-25 DIAGNOSIS — N186 End stage renal disease: Secondary | ICD-10-CM | POA: Diagnosis not present

## 2014-05-25 DIAGNOSIS — N2581 Secondary hyperparathyroidism of renal origin: Secondary | ICD-10-CM | POA: Diagnosis not present

## 2014-05-25 DIAGNOSIS — D509 Iron deficiency anemia, unspecified: Secondary | ICD-10-CM | POA: Diagnosis not present

## 2014-05-25 NOTE — Telephone Encounter (Signed)
Very sorry, but I am not able to help in this regard  Pt should inquire about this from his nephrologist

## 2014-05-25 NOTE — Telephone Encounter (Signed)
Not sure what I should tell the transplant company since I'm not sure if you were the provider who placed him on the list.

## 2014-05-27 DIAGNOSIS — D509 Iron deficiency anemia, unspecified: Secondary | ICD-10-CM | POA: Diagnosis not present

## 2014-05-27 DIAGNOSIS — N2581 Secondary hyperparathyroidism of renal origin: Secondary | ICD-10-CM | POA: Diagnosis not present

## 2014-05-27 DIAGNOSIS — N186 End stage renal disease: Secondary | ICD-10-CM | POA: Diagnosis not present

## 2014-05-30 DIAGNOSIS — N2581 Secondary hyperparathyroidism of renal origin: Secondary | ICD-10-CM | POA: Diagnosis not present

## 2014-05-30 DIAGNOSIS — D509 Iron deficiency anemia, unspecified: Secondary | ICD-10-CM | POA: Diagnosis not present

## 2014-05-30 DIAGNOSIS — N186 End stage renal disease: Secondary | ICD-10-CM | POA: Diagnosis not present

## 2014-06-01 DIAGNOSIS — D509 Iron deficiency anemia, unspecified: Secondary | ICD-10-CM | POA: Diagnosis not present

## 2014-06-01 DIAGNOSIS — N2581 Secondary hyperparathyroidism of renal origin: Secondary | ICD-10-CM | POA: Diagnosis not present

## 2014-06-01 DIAGNOSIS — N186 End stage renal disease: Secondary | ICD-10-CM | POA: Diagnosis not present

## 2014-06-02 DIAGNOSIS — N186 End stage renal disease: Secondary | ICD-10-CM | POA: Diagnosis not present

## 2014-06-02 DIAGNOSIS — Z992 Dependence on renal dialysis: Secondary | ICD-10-CM | POA: Diagnosis not present

## 2014-06-02 DIAGNOSIS — Y841 Kidney dialysis as the cause of abnormal reaction of the patient, or of later complication, without mention of misadventure at the time of the procedure: Secondary | ICD-10-CM | POA: Diagnosis not present

## 2014-06-02 DIAGNOSIS — I10 Essential (primary) hypertension: Secondary | ICD-10-CM | POA: Diagnosis not present

## 2014-06-03 DIAGNOSIS — D509 Iron deficiency anemia, unspecified: Secondary | ICD-10-CM | POA: Diagnosis not present

## 2014-06-03 DIAGNOSIS — N2581 Secondary hyperparathyroidism of renal origin: Secondary | ICD-10-CM | POA: Diagnosis not present

## 2014-06-03 DIAGNOSIS — N186 End stage renal disease: Secondary | ICD-10-CM | POA: Diagnosis not present

## 2014-06-06 DIAGNOSIS — N186 End stage renal disease: Secondary | ICD-10-CM | POA: Diagnosis not present

## 2014-06-06 DIAGNOSIS — N2581 Secondary hyperparathyroidism of renal origin: Secondary | ICD-10-CM | POA: Diagnosis not present

## 2014-06-06 DIAGNOSIS — D509 Iron deficiency anemia, unspecified: Secondary | ICD-10-CM | POA: Diagnosis not present

## 2014-06-07 ENCOUNTER — Telehealth: Payer: Self-pay | Admitting: *Deleted

## 2014-06-07 ENCOUNTER — Ambulatory Visit: Payer: Self-pay | Admitting: Neurology

## 2014-06-07 NOTE — Telephone Encounter (Signed)
No showed appt 

## 2014-06-08 ENCOUNTER — Emergency Department (HOSPITAL_COMMUNITY)
Admission: EM | Admit: 2014-06-08 | Discharge: 2014-06-08 | Disposition: A | Discharge status: Home / Self Care | Payer: Medicare Other | Attending: Emergency Medicine | Admitting: Emergency Medicine

## 2014-06-08 ENCOUNTER — Encounter: Payer: Self-pay | Admitting: Neurology

## 2014-06-08 ENCOUNTER — Encounter (HOSPITAL_COMMUNITY): Payer: Self-pay | Admitting: Neurology

## 2014-06-08 DIAGNOSIS — I2510 Atherosclerotic heart disease of native coronary artery without angina pectoris: Secondary | ICD-10-CM | POA: Insufficient documentation

## 2014-06-08 DIAGNOSIS — G40909 Epilepsy, unspecified, not intractable, without status epilepticus: Secondary | ICD-10-CM | POA: Diagnosis not present

## 2014-06-08 DIAGNOSIS — D509 Iron deficiency anemia, unspecified: Secondary | ICD-10-CM | POA: Diagnosis not present

## 2014-06-08 DIAGNOSIS — M109 Gout, unspecified: Secondary | ICD-10-CM | POA: Insufficient documentation

## 2014-06-08 DIAGNOSIS — E785 Hyperlipidemia, unspecified: Secondary | ICD-10-CM | POA: Diagnosis not present

## 2014-06-08 DIAGNOSIS — Z87442 Personal history of urinary calculi: Secondary | ICD-10-CM | POA: Insufficient documentation

## 2014-06-08 DIAGNOSIS — G4700 Insomnia, unspecified: Secondary | ICD-10-CM | POA: Insufficient documentation

## 2014-06-08 DIAGNOSIS — N184 Chronic kidney disease, stage 4 (severe): Secondary | ICD-10-CM | POA: Insufficient documentation

## 2014-06-08 DIAGNOSIS — Z79899 Other long term (current) drug therapy: Secondary | ICD-10-CM | POA: Diagnosis not present

## 2014-06-08 DIAGNOSIS — Z7982 Long term (current) use of aspirin: Secondary | ICD-10-CM | POA: Insufficient documentation

## 2014-06-08 DIAGNOSIS — I5030 Unspecified diastolic (congestive) heart failure: Secondary | ICD-10-CM | POA: Diagnosis not present

## 2014-06-08 DIAGNOSIS — I252 Old myocardial infarction: Secondary | ICD-10-CM | POA: Diagnosis not present

## 2014-06-08 DIAGNOSIS — I129 Hypertensive chronic kidney disease with stage 1 through stage 4 chronic kidney disease, or unspecified chronic kidney disease: Secondary | ICD-10-CM | POA: Diagnosis not present

## 2014-06-08 DIAGNOSIS — Z86010 Personal history of colonic polyps: Secondary | ICD-10-CM | POA: Diagnosis not present

## 2014-06-08 DIAGNOSIS — M1990 Unspecified osteoarthritis, unspecified site: Secondary | ICD-10-CM | POA: Insufficient documentation

## 2014-06-08 DIAGNOSIS — R531 Weakness: Secondary | ICD-10-CM | POA: Diagnosis not present

## 2014-06-08 DIAGNOSIS — Z791 Long term (current) use of non-steroidal anti-inflammatories (NSAID): Secondary | ICD-10-CM | POA: Insufficient documentation

## 2014-06-08 DIAGNOSIS — Z7901 Long term (current) use of anticoagulants: Secondary | ICD-10-CM | POA: Diagnosis not present

## 2014-06-08 DIAGNOSIS — Z8673 Personal history of transient ischemic attack (TIA), and cerebral infarction without residual deficits: Secondary | ICD-10-CM | POA: Insufficient documentation

## 2014-06-08 DIAGNOSIS — D649 Anemia, unspecified: Secondary | ICD-10-CM | POA: Diagnosis not present

## 2014-06-08 DIAGNOSIS — Z9861 Coronary angioplasty status: Secondary | ICD-10-CM | POA: Diagnosis not present

## 2014-06-08 DIAGNOSIS — N186 End stage renal disease: Secondary | ICD-10-CM | POA: Diagnosis not present

## 2014-06-08 DIAGNOSIS — F329 Major depressive disorder, single episode, unspecified: Secondary | ICD-10-CM | POA: Diagnosis not present

## 2014-06-08 DIAGNOSIS — R55 Syncope and collapse: Secondary | ICD-10-CM | POA: Diagnosis present

## 2014-06-08 DIAGNOSIS — Z8719 Personal history of other diseases of the digestive system: Secondary | ICD-10-CM | POA: Diagnosis not present

## 2014-06-08 DIAGNOSIS — N2581 Secondary hyperparathyroidism of renal origin: Secondary | ICD-10-CM | POA: Diagnosis not present

## 2014-06-08 DIAGNOSIS — R42 Dizziness and giddiness: Secondary | ICD-10-CM | POA: Diagnosis not present

## 2014-06-08 LAB — BASIC METABOLIC PANEL
ANION GAP: 16 — AB (ref 5–15)
BUN: 21 mg/dL (ref 6–23)
CO2: 24 mmol/L (ref 19–32)
CREATININE: 7.69 mg/dL — AB (ref 0.50–1.35)
Calcium: 9.3 mg/dL (ref 8.4–10.5)
Chloride: 93 mmol/L — ABNORMAL LOW (ref 96–112)
GFR calc non Af Amer: 7 mL/min — ABNORMAL LOW (ref >90)
GFR, EST AFRICAN AMERICAN: 8 mL/min — AB (ref >90)
Glucose, Bld: 157 mg/dL — ABNORMAL HIGH (ref 70–99)
Potassium: 3.8 mmol/L (ref 3.5–5.1)
Sodium: 133 mmol/L — ABNORMAL LOW (ref 135–145)

## 2014-06-08 LAB — CBC
HCT: 32.9 % — ABNORMAL LOW (ref 39.0–52.0)
Hemoglobin: 11.1 g/dL — ABNORMAL LOW (ref 13.0–17.0)
MCH: 32.3 pg (ref 26.0–34.0)
MCHC: 33.7 g/dL (ref 30.0–36.0)
MCV: 95.6 fL (ref 78.0–100.0)
PLATELETS: 271 10*3/uL (ref 150–400)
RBC: 3.44 MIL/uL — AB (ref 4.22–5.81)
RDW: 14.1 % (ref 11.5–15.5)
WBC: 6.4 10*3/uL (ref 4.0–10.5)

## 2014-06-08 LAB — I-STAT TROPONIN, ED
TROPONIN I, POC: 0.08 ng/mL (ref 0.00–0.08)
Troponin i, poc: 0.07 ng/mL (ref 0.00–0.08)

## 2014-06-08 NOTE — ED Notes (Signed)
[facility] [phone] Sister

## 2014-06-08 NOTE — ED Provider Notes (Signed)
CSN: [id number]     Arrival date & time 06/08/14  1721 History   First MD Initiated Contact with Patient 06/08/14 2009     Chief Complaint  Patient presents with  . Near Syncope     (Consider location/radiation/quality/duration/timing/severity/associated sxs/prior Treatment) HPI   [name] is a 54 year old male, with past medical history of previous CVA, coronary disease, chronic kidney disease, anemia. He comes in today after an episode of weakness. He had dialysis earlier today and was walking around afterwards when he suddenly felt weakness in his bilateral lower extremities.  He then sat down.  He did not feel lightheaded he did not faint, did not feel any weakness, greater on one side than the other, and no weakness in his upper extremities, no trouble speaking, no double vision. EMS was called and when they arrived per report he had a blood pressure in the 90s systolic and was tachycardic in the 140s with sinus rhythm.  He was given some water to drink and says he has since come back to his baseline.  Past Medical History  Diagnosis Date  . ACHALASIA   . CEREBROVASCULAR ACCIDENT, HX OF   . Headache(784.0)   . HYPERLIPIDEMIA     takes crestor daily  . RENAL CALCULUS, HX OF   . RENAL INSUFFICIENCY   . H/O hiatal hernia   . CAD (coronary artery disease), 3 vessel significant disease.    . Impaired glucose tolerance   . S/P PTCA (percutaneous transluminal coronary angioplasty), 06/30/11 07/01/2011  . CKD (chronic kidney disease) stage 4, GFR 15-29 ml/min   . History of diastolic dysfunction, grade 2 by echo   . S/P coronary artery stent placement, PTCA/DES Resolute to mid-LAD via the LIMA graft, and PTCA of apical 95% stenosis 07/05/11 07/04/2011  . DEPRESSION   . Sleep apnea     SLEEP STUDY IN [place] , NO MACHINE YET  1 YEAR  . Seizures     INSULIN INDUCED  . ANEMIA-NOS   . NSTEMI (non-ST elevated myocardial infarction), 06/30/11 07/01/2011  . Stroke    93/2005/2006/2007;left sided weakness  . DEGENERATIVE JOINT DISEASE, SHOULDER     knee  . Gout     takes Uloric daily  . GERD     no meds required  . History of colon polyps   . HYPERTENSION     takes Norvasc,Catapress,Hydralazine,Imdur,and Metoprolol daily  . Insomnia     takes Ambien nightly  . Hyperlipidemia    Past Surgical History  Procedure Laterality Date  . Tonsillectomy    . Stomach surgery  2009    achalasia  . Knee surgery  01/07/2011    right  . Insertion of dialysis catheter  04/01/2011    Procedure: INSERTION OF DIALYSIS CATHETER;  Surgeon: Nilda Simmer, MD;  Location: [facility] OR;  Service: Vascular;  Laterality: N/A;  . Cardiac catheterization    . Av fistula placement  12/02/2011    Procedure: ARTERIOVENOUS (AV) FISTULA CREATION;  Surgeon: Fransisco Hertz, MD;  Location: [facility] OR;  Service: Vascular;  Laterality: Left;  Ultrasound Guided  . Subclavian stent placement  12/20/2011  . Coronary artery bypass graft  04/01/2011    Procedure: CORONARY ARTERY BYPASS GRAFTING (CABG);  Surgeon: Kathlee Nations Suann Larry, MD;  Location: [facility] OR;  Service: Open Heart Surgery;  Laterality: N/A;  . Coronary angioplasty      06/30/11   AT [facility]  . Colonoscopy    . Hyperlipidemia    . Ligation of  arteriovenous  fistula Left 06/08/2012    Procedure: LIGATION OF ARTERIOVENOUS  FISTULA;  Surgeon: Fransisco Hertz, MD;  Location: [facility] OR;  Service: Vascular;  Laterality: Left;  . Av fistula placement Left 06/08/2012    Procedure: ARTERIOVENOUS (AV) FISTULA CREATION;  Surgeon: Fransisco Hertz, MD;  Location: [facility] OR;  Service: Vascular;  Laterality: Left;  . Left heart catheterization with coronary angiogram N/A 03/28/2011    Procedure: LEFT HEART CATHETERIZATION WITH CORONARY ANGIOGRAM;  Surgeon: Lennette Bihari, MD;  Location: [facility] CATH LAB;  Service: Cardiovascular;  Laterality: N/A;  pending creatnine  . Left heart catheterization with coronary/graft angiogram N/A 06/30/2011    Procedure: LEFT HEART  CATHETERIZATION WITH Isabel Caprice;  Surgeon: Thurmon Fair, MD;  Location: [facility] CATH LAB;  Service: Cardiovascular;  Laterality: N/A;  . Percutaneous coronary intervention-balloon only  06/30/2011    Procedure: PERCUTANEOUS CORONARY INTERVENTION-BALLOON ONLY;  Surgeon: Thurmon Fair, MD;  Location: [facility] CATH LAB;  Service: Cardiovascular;;  . Percutaneous coronary stent intervention (pci-s) N/A 07/04/2011    Procedure: PERCUTANEOUS CORONARY STENT INTERVENTION (PCI-S);  Surgeon: Lennette Bihari, MD;  Location: [facility] CATH LAB;  Service: Cardiovascular;  Laterality: N/A;  . Arch aortogram N/A 02/19/2012    Procedure: ARCH AORTOGRAM;  Surgeon: Fransisco Hertz, MD;  Location: [facility] CATH LAB;  Service: Cardiovascular;  Laterality: N/A;   Family History  Problem Relation Age of Onset  . Hypertension Other   . Stroke Other   . Hyperlipidemia Other   . Cancer Mother   . Heart disease Mother   . Hyperlipidemia Mother   . Hypertension Mother   . Cancer Father   . Heart disease Father    History  Substance Use Topics  . Smoking status: Never Smoker   . Smokeless tobacco: Former Neurosurgeon    Types: Chew    Quit date: 03/18/2003  . Alcohol Use: No     Comment: rarely    Review of Systems  Constitutional: Negative for fever and chills.  Eyes: Negative for redness.  Respiratory: Negative for cough and shortness of breath.   Cardiovascular: Negative for chest pain.  Gastrointestinal: Negative for nausea, vomiting, abdominal pain and diarrhea.  Genitourinary: Negative for dysuria.  Skin: Negative for rash.  Neurological: Positive for weakness. Negative for headaches.  All other systems reviewed and are negative.     Allergies  Shrimp; Insulins; Atorvastatin; Simvastatin; and Ultram  Home Medications   Prior to Admission medications   Medication Sig Start Date End Date Taking? Authorizing Provider  acetaminophen (TYLENOL) 500 MG tablet Take 1,000 mg by mouth every 6 (six) hours as needed  (pain).   Yes Historical Provider, MD  aspirin EC 81 MG EC tablet Take 1 tablet (81 mg total) by mouth daily. 07/07/11  Yes Dwana Melena, PA-C  cetirizine (ZYRTEC) 10 MG tablet Take 10 mg by mouth at bedtime.   Yes Historical Provider, MD  cloNIDine (CATAPRES) 0.2 MG tablet Take 0.2 mg by mouth 2 (two) times daily.  05/13/11  Yes Corwin Levins, MD  clopidogrel (PLAVIX) 75 MG tablet Take 1 tablet (75 mg total) by mouth at bedtime. 04/13/14  Yes Corwin Levins, MD  febuxostat (ULORIC) 40 MG tablet Take 40 mg by mouth at bedtime.  05/13/11  Yes Corwin Levins, MD  GREEN TEA, CAMILLIA SINENSIS, PO Take 2 capsules by mouth daily.   Yes Historical Provider, MD  hydrALAZINE (APRESOLINE) 10 MG tablet Take 10 mg by mouth 2 (two) times daily.  Yes Historical Provider, MD  KRILL OIL PO Take 3 capsules by mouth daily.   Yes Historical Provider, MD  metoprolol (LOPRESSOR) 50 MG tablet TAKE 1 TABLET BY MOUTH TWICE A DAY 03/07/14  Yes Corwin Levins, MD  multivitamin (RENA-VIT) TABS tablet Take 1 tablet by mouth daily.   Yes Historical Provider, MD  OVER THE COUNTER MEDICATION Take 1 tablet by mouth at bedtime. HIGH POTASSIUM LEVEL MEDICATION   Yes Historical Provider, MD  OVER THE COUNTER MEDICATION Take 1 tablet by mouth at bedtime. PROSTATE SUPPLEMENT   Yes Historical Provider, MD  polysaccharide iron (NIFEREX) 150 MG CAPS capsule Take 150 mg by mouth at bedtime.  05/28/11  Yes Corwin Levins, MD  ranolazine (RANEXA) 500 MG 12 hr tablet Take 1 tablet (500 mg total) by mouth every morning. 07/19/13 07/19/14 Yes Runell Gess, MD  rosuvastatin (CRESTOR) 20 MG tablet Take 1 tablet (20 mg total) by mouth every evening. 01/18/14  Yes Corwin Levins, MD  terazosin (HYTRIN) 5 MG capsule Take 1 capsule (5 mg total) by mouth at bedtime. 01/01/14  Yes Esperanza Sheets, MD  ticagrelor (BRILINTA) 90 MG TABS tablet Take 90 mg by mouth daily.   Yes Historical Provider, MD  zolpidem (AMBIEN) 10 MG tablet Take 1 tablet (10 mg total) by mouth  at bedtime as needed for sleep. 02/14/14  Yes Corwin Levins, MD  isosorbide mononitrate (IMDUR) 30 MG 24 hr tablet Take 1 tablet (30 mg total) by mouth daily. 06/24/13   Leone Brand, NP  levofloxacin (LEVAQUIN) 250 MG tablet Take 1 tablet (250 mg total) by mouth every other day. 01/01/14   Esperanza Sheets, MD  NITROSTAT 0.4 MG SL tablet PLACE 1 TABLET UNDER TONGUE AS NEEDED FOR CHEST PAIN EVERY 5 MINUTES (MAX 3DOSES)    Runell Gess, MD  oxyCODONE (ROXICODONE) 5 MG immediate release tablet Take 1 tablet (5 mg total) by mouth every 4 (four) hours as needed for pain. Patient taking differently: Take 5 mg by mouth every 4 (four) hours as needed (pain).  06/08/12   Lars Mage, PA-C   BP 141/88 mmHg  Pulse 80  Temp(Src) 98.7 F (37.1 C) (Oral)  Resp 16  Ht 6' (1.829 m)  Wt 220 lb (99.791 kg)  BMI 29.83 kg/m2  SpO2 100% Physical Exam  Constitutional: He is oriented to person, place, and time. No distress.  HENT:  Head: Normocephalic and atraumatic.  Eyes: EOM are normal. Pupils are equal, round, and reactive to light.  Neck: Normal range of motion. Neck supple.  Cardiovascular: Normal rate.   Pulmonary/Chest: Effort normal. No respiratory distress.  Abdominal: Soft. There is no tenderness.  Musculoskeletal: Normal range of motion.  Neurological: He is alert and oriented to person, place, and time. He has normal strength. He is not disoriented. No cranial nerve deficit or sensory deficit. Coordination and gait normal.  Skin: No rash noted. He is not diaphoretic.  Psychiatric: He has a normal mood and affect.    ED Course  Procedures (including critical care time) Labs Review Labs Reviewed  CBC - Abnormal; Notable for the following:    RBC 3.44 (*)    Hemoglobin 11.1 (*)    HCT 32.9 (*)    All other components within normal limits  BASIC METABOLIC PANEL - Abnormal; Notable for the following:    Sodium 133 (*)    Chloride 93 (*)    Glucose, Bld 157 (*)    Creatinine,  Ser  7.69 (*)    GFR calc non Af Amer 7 (*)    GFR calc Af Amer 8 (*)    Anion gap 16 (*)    All other components within normal limits  I-STAT TROPOININ, ED  I-STAT TROPOININ, ED    Imaging Review No results found.   EKG Interpretation   Date/Time:  Thursday June 08 2014 17:38:01 EDT Ventricular Rate:  99 PR Interval:  140 QRS Duration: 70 QT Interval:  386 QTC Calculation: 495 R Axis:   -60 Text Interpretation:  Sinus rhythm with occasional Premature ventricular  complexes Left anterior fascicular block Cannot rule out Inferior infarct  , age undetermined Anterolateral infarct , age undetermined Abnormal ECG  Confirmed by RAY MD, Duwayne Heck [id number]) on 06/08/2014 10:56:59 PM      MDM   Final diagnoses:  Weakness    [name] is a 54 year old male, with past medical history of previous CVA, coronary disease, chronic kidney disease, anemia. He comes in today after an episode of weakness.  EKG with EMS in the field reviewed, and shows sinus tachycardia. On arrival here he has a heart rate of 98 with a normal blood pressure. EKG on arrival here shows a normal QTC, normal PR, no evidence of Brugada.  Concern for possible presyncope as the cause of these symptoms. Basic labs obtained showed hemoglobin 11.1 which is above the patient's baseline, electrolytes all unremarkable with a potassium of 3.8, and troponin 0 and 3 hours negative. Patient has no chest pain or shortness of breath, doubt PE.  Patient afebrile w/o infectious sx, doubt infection.   Unsure of exact etiology of this patient's symptoms. No true syncope, patient back to baseline. He is low enough risk at this time for a cardiogenic cause of this transient weakness, that I feel discharge with close outpatient follow-up with his cardiologist is reasonable.  I have discussed the results, Dx and Tx plan with the patient. They expressed understanding and agree with the plan and were told to return to ED with any worsening  of condition or concern.    Disposition: Discharge  Condition: Good  Discharge Medication List as of 06/08/2014 11:16 PM      Follow Up: Corwin Levins, MD [address] [place] [address] [phone]      Pt seen in conjunction with Dr. Sheppard Coil, MD 06/09/14 [id number]  Margarita Grizzle, MD 06/10/14 [phone]

## 2014-06-08 NOTE — Discharge Instructions (Signed)
Thank you for allowing [place] to take care of you today.  Please follow up with your primary care doctor and your cardiologist  Weakness Weakness is a lack of strength. You may feel weak all over your body or just in one part of your body. Weakness can be serious. In some cases, you may need more medical tests. HOME CARE  Rest.  Eat a well-balanced diet.  Try to exercise every day.  Only take medicines as told by your doctor. GET HELP RIGHT AWAY IF:   You cannot do your normal daily activities.  You cannot walk up and down stairs, or you feel very tired when you do so.  You have shortness of breath or chest pain.  You have trouble moving parts of your body.  You have weakness in only one body part or on only one side of the body.  You have a fever.  You have trouble speaking or swallowing.  You cannot control when you pee (urinate) or poop (bowel movement).  You have black or bloody throw up (vomit) or poop.  Your weakness gets worse or spreads to other body parts.  You have new aches or pains. MAKE SURE YOU:   Understand these instructions.  Will watch your condition.  Will get help right away if you are not doing well or get worse. Document Released: 02/14/2008 Document Revised: 09/02/2011 Document Reviewed: 05/02/2011 [facility] Patient Information 2015 [place], [place]. This information is not intended to replace advice given to you by your health care provider. Make sure you discuss any questions you have with your health care provider.

## 2014-06-08 NOTE — ED Notes (Signed)
Pt reports today he was at [employer] had walked to the park and while waiting in line, felt his legs get weak and he sat down. Had dialysis earlier today. Denies pain, SOB. EMS came out and told pt to come get evaluated. Pt is a x 4. In NAD

## 2014-06-10 DIAGNOSIS — N2581 Secondary hyperparathyroidism of renal origin: Secondary | ICD-10-CM | POA: Diagnosis not present

## 2014-06-10 DIAGNOSIS — N186 End stage renal disease: Secondary | ICD-10-CM | POA: Diagnosis not present

## 2014-06-10 DIAGNOSIS — D509 Iron deficiency anemia, unspecified: Secondary | ICD-10-CM | POA: Diagnosis not present

## 2014-06-13 DIAGNOSIS — N186 End stage renal disease: Secondary | ICD-10-CM | POA: Diagnosis not present

## 2014-06-13 DIAGNOSIS — N2581 Secondary hyperparathyroidism of renal origin: Secondary | ICD-10-CM | POA: Diagnosis not present

## 2014-06-13 DIAGNOSIS — D509 Iron deficiency anemia, unspecified: Secondary | ICD-10-CM | POA: Diagnosis not present

## 2014-06-15 DIAGNOSIS — D509 Iron deficiency anemia, unspecified: Secondary | ICD-10-CM | POA: Diagnosis not present

## 2014-06-15 DIAGNOSIS — N186 End stage renal disease: Secondary | ICD-10-CM | POA: Diagnosis not present

## 2014-06-15 DIAGNOSIS — N2581 Secondary hyperparathyroidism of renal origin: Secondary | ICD-10-CM | POA: Diagnosis not present

## 2014-06-15 DIAGNOSIS — Z992 Dependence on renal dialysis: Secondary | ICD-10-CM | POA: Diagnosis not present

## 2014-06-17 DIAGNOSIS — N186 End stage renal disease: Secondary | ICD-10-CM | POA: Diagnosis not present

## 2014-06-20 ENCOUNTER — Encounter: Payer: Self-pay | Admitting: Podiatry

## 2014-06-20 DIAGNOSIS — N186 End stage renal disease: Secondary | ICD-10-CM | POA: Diagnosis not present

## 2014-06-20 DIAGNOSIS — Z992 Dependence on renal dialysis: Secondary | ICD-10-CM | POA: Diagnosis not present

## 2014-06-22 DIAGNOSIS — N186 End stage renal disease: Secondary | ICD-10-CM | POA: Diagnosis not present

## 2014-06-24 DIAGNOSIS — N186 End stage renal disease: Secondary | ICD-10-CM | POA: Diagnosis not present

## 2014-06-27 DIAGNOSIS — N186 End stage renal disease: Secondary | ICD-10-CM | POA: Diagnosis not present

## 2014-06-29 DIAGNOSIS — N186 End stage renal disease: Secondary | ICD-10-CM | POA: Diagnosis not present

## 2014-07-01 DIAGNOSIS — N186 End stage renal disease: Secondary | ICD-10-CM | POA: Diagnosis not present

## 2014-07-04 DIAGNOSIS — N186 End stage renal disease: Secondary | ICD-10-CM | POA: Diagnosis not present

## 2014-07-06 DIAGNOSIS — N186 End stage renal disease: Secondary | ICD-10-CM | POA: Diagnosis not present

## 2014-07-08 DIAGNOSIS — N186 End stage renal disease: Secondary | ICD-10-CM | POA: Diagnosis not present

## 2014-07-08 NOTE — Op Note (Signed)
PATIENT NAME:  [name], [name] MR#:  [id number] DATE OF BIRTH:  Sep 02, 1960  DATE OF PROCEDURE:  12/27/2013  PREOPERATIVE DIAGNOSES: 1.  End-stage renal disease.  2.  Thrombosed left upper extremity arteriovenous fistula.  3.  Severe hypertension.   POSTOPERATIVE DIAGNOSES:  1.  End-stage renal disease.  2.  Thrombosed left upper extremity arteriovenous fistula.  3.  Severe hypertension.   PROCEDURES: 1.  Ultrasound guidance in both an antegrade and retrograde fashion to the left arm arteriovenous fistula.  2.  Left upper extremity fistulogram and central venogram.  3. Catheter-directed thrombolysis with 4 mg of tPA throughout the left brachiocephalic arteriovenous fistula, all the way up to the subclavian vein confluence.  4.  Mechanical rheolytic thrombectomy throughout the left upper extremity arteriovenous fistula from the anastomosis to the subclavian vein confluence.  5.  Fogarty embolectomy for arterial plug.  6.  Percutaneous transluminal angioplasty of the arteriovenous anastomosis in the proximal portion of the fistula with 7 mm diameter high-pressure angioplasty balloon for stenosis and thrombosis.  7.  Percutaneous transluminal angioplasty of entire cephalic vein in the upper arm with 7 mm diameter angioplasty balloon.  8.  Percutaneous transluminal angioplasty with 7 and 8 mm diameter angioplasty balloon to the cephalic vein and subclavian vein confluence.  9.  Viabahn-covered stent placement to the cephalic vein, throughout the upper arm from the mid to proximal upper arm cephalic vein to proximal cephalic vein with a 7 mm diameter x 25 cm length Viabahn-covered stent.  10.  Viabahn-covered stent to the cephalic vein to the subclavian vein confluence with 8 mm diameter x 10 cm length Viabahn stent.   SURGEON: Annice Needy, MD   ANESTHESIA: Local with moderate conscious sedation.   BLOOD LOSS: Approximately 50 mL.   INDICATION FOR PROCEDURE: A 54 year old gentleman with  end-stage renal disease. His fistula, which has only been used a handful of times is clotted and we are attempting to salvage this. Risks and benefits were discussed. Informed consent was obtained.   DESCRIPTION OF THE PROCEDURE:  The patient was brought to the vascular suite. The left upper extremity was sterilely prepped and draped, and a sterile surgical field was created. The fistula was accessed first in a retrograde fashion under direct ultrasound guidance. A micropuncture needle and micropuncture wire and sheath were placed and then in an antegrade direction about 5 cm beyond the anastomosis with a micropuncture needle under direct ultrasound guidance. Permanent image was recorded in both directions. The fistula was small and somewhat sclerotic after being thrombosed, but access was achieved pretty easily. I then upsized to a 6 [place] sheath and gave the patient 4000 units of intravenous heparin. Magic Torque wire was placed into the brachial artery from the retrograde sheath and into the central venous circulation from the antegrade sheath, both with the help of a Kumpe catheter; 4 mg of tPA were then delivered, half through in each direction through the AngioJet AVX catheter from the anastomosis to the subclavian vein, cephalic vein confluence. This was allowed to dwell for about 15 minutes and mechanical rheolytic thrombectomy was performed. Angioplasty was performed. The vein was sclerotic and small and stenosis in the small vein that was not adequate for any flow was seen  after thrombolysis and thrombectomy. There was still very minimal flow through the fistula. I used a 7 mm diameter angioplasty balloon at the arterial anastomosis through the retrograde sheath. I also used a 7 mm diameter angioplasty balloon through the antegrade sheath for the  mid fistula and proximal upper arm fistula. At the area of the cephalic vein and subclavian vein confluence, a 7 mm diameter and then an 8 mm diameter  high-pressure angioplasty balloon was used. Following this, there was now flow, but there was still significant thrombus and stenosis and multiple areas of the fistula and a residual arterial plug. A Fogarty embolectomy balloon was used through the retrograde sheath to pull out the plug. This cleared the arterial portion of the graft up to and just beyond the antegrade sheath. This allowed [place] to pull the retrograde sheath. This was removed. Angioplasty was repeated, but the fistula still did not have adequate flow and had multiple areas of stenosis and thrombosis.  I exchanged for a 7 French sheath and an 0.018 wire and then used a 7 mm diameter x 25 cm in length Viabahn-covered stent. This was deployed from mid upper arm fistula to the area of the shoulder and post dilated with a 7 mm balloon.  Following this, there was still residual stenosis at the cephalic vein and subclavian vein confluence and thrombus in this location. I selected an 8 mm diameter x 10 cm length Viabahn- covered stent and took this down to the cephalic vein, emptying into the subclavian vein, and  bridged back just to the edge of the previously placed stent. This was post dilated with an 8 mm diameter angioplasty balloon as was the remainder of the previously placed stent, upsized to an 8 mm balloon to post dilate this. The fistula now had clinical flow that could be palpable. Imaging showed the fistula to be patent. The remainder of the central venous circulation was patent.   At this point, I elected to terminate the procedure. The second sheath was removed, 4-0 Monocryl pursestring suture was placed. Pressure was held. Sterile dressing was placed. The patient tolerated the procedure well and was taken to the recovery room in stable condition.     ____________________________ Annice Needy, MD jsd:LT D: 12/28/2013 13:40:31 ET T: 12/28/2013 18:45:28 ET JOB#: [id number]  cc: Annice Needy, MD, <Dictator> Annice Needy MD ELECTRONICALLY  SIGNED 12/31/2013 12:37

## 2014-07-11 DIAGNOSIS — N186 End stage renal disease: Secondary | ICD-10-CM | POA: Diagnosis not present

## 2014-07-13 DIAGNOSIS — N186 End stage renal disease: Secondary | ICD-10-CM | POA: Diagnosis not present

## 2014-07-15 DIAGNOSIS — N186 End stage renal disease: Secondary | ICD-10-CM | POA: Diagnosis not present

## 2014-07-18 DIAGNOSIS — Z992 Dependence on renal dialysis: Secondary | ICD-10-CM | POA: Diagnosis not present

## 2014-07-18 DIAGNOSIS — N186 End stage renal disease: Secondary | ICD-10-CM | POA: Diagnosis not present

## 2014-07-19 ENCOUNTER — Ambulatory Visit: Payer: Medicare Other | Admitting: Internal Medicine

## 2014-07-19 DIAGNOSIS — Z0289 Encounter for other administrative examinations: Secondary | ICD-10-CM

## 2014-07-19 DIAGNOSIS — Z992 Dependence on renal dialysis: Secondary | ICD-10-CM | POA: Diagnosis not present

## 2014-07-19 DIAGNOSIS — N186 End stage renal disease: Secondary | ICD-10-CM | POA: Diagnosis not present

## 2014-07-20 DIAGNOSIS — D631 Anemia in chronic kidney disease: Secondary | ICD-10-CM | POA: Diagnosis not present

## 2014-07-20 DIAGNOSIS — Z992 Dependence on renal dialysis: Secondary | ICD-10-CM | POA: Diagnosis not present

## 2014-07-20 DIAGNOSIS — D509 Iron deficiency anemia, unspecified: Secondary | ICD-10-CM | POA: Diagnosis not present

## 2014-07-20 DIAGNOSIS — N186 End stage renal disease: Secondary | ICD-10-CM | POA: Diagnosis not present

## 2014-07-22 DIAGNOSIS — D509 Iron deficiency anemia, unspecified: Secondary | ICD-10-CM | POA: Diagnosis not present

## 2014-07-22 DIAGNOSIS — N186 End stage renal disease: Secondary | ICD-10-CM | POA: Diagnosis not present

## 2014-07-22 DIAGNOSIS — D631 Anemia in chronic kidney disease: Secondary | ICD-10-CM | POA: Diagnosis not present

## 2014-07-25 DIAGNOSIS — D509 Iron deficiency anemia, unspecified: Secondary | ICD-10-CM | POA: Diagnosis not present

## 2014-07-25 DIAGNOSIS — D631 Anemia in chronic kidney disease: Secondary | ICD-10-CM | POA: Diagnosis not present

## 2014-07-25 DIAGNOSIS — N186 End stage renal disease: Secondary | ICD-10-CM | POA: Diagnosis not present

## 2014-07-27 DIAGNOSIS — D631 Anemia in chronic kidney disease: Secondary | ICD-10-CM | POA: Diagnosis not present

## 2014-07-27 DIAGNOSIS — D509 Iron deficiency anemia, unspecified: Secondary | ICD-10-CM | POA: Diagnosis not present

## 2014-07-27 DIAGNOSIS — N186 End stage renal disease: Secondary | ICD-10-CM | POA: Diagnosis not present

## 2014-07-29 DIAGNOSIS — D509 Iron deficiency anemia, unspecified: Secondary | ICD-10-CM | POA: Diagnosis not present

## 2014-07-29 DIAGNOSIS — D631 Anemia in chronic kidney disease: Secondary | ICD-10-CM | POA: Diagnosis not present

## 2014-07-29 DIAGNOSIS — N186 End stage renal disease: Secondary | ICD-10-CM | POA: Diagnosis not present

## 2014-08-01 DIAGNOSIS — N186 End stage renal disease: Secondary | ICD-10-CM | POA: Diagnosis not present

## 2014-08-01 DIAGNOSIS — D509 Iron deficiency anemia, unspecified: Secondary | ICD-10-CM | POA: Diagnosis not present

## 2014-08-01 DIAGNOSIS — D631 Anemia in chronic kidney disease: Secondary | ICD-10-CM | POA: Diagnosis not present

## 2014-08-03 ENCOUNTER — Other Ambulatory Visit: Payer: Self-pay | Admitting: Internal Medicine

## 2014-08-03 DIAGNOSIS — D509 Iron deficiency anemia, unspecified: Secondary | ICD-10-CM | POA: Diagnosis not present

## 2014-08-03 DIAGNOSIS — N186 End stage renal disease: Secondary | ICD-10-CM | POA: Diagnosis not present

## 2014-08-03 DIAGNOSIS — D631 Anemia in chronic kidney disease: Secondary | ICD-10-CM | POA: Diagnosis not present

## 2014-08-05 DIAGNOSIS — D509 Iron deficiency anemia, unspecified: Secondary | ICD-10-CM | POA: Diagnosis not present

## 2014-08-05 DIAGNOSIS — D631 Anemia in chronic kidney disease: Secondary | ICD-10-CM | POA: Diagnosis not present

## 2014-08-05 DIAGNOSIS — N186 End stage renal disease: Secondary | ICD-10-CM | POA: Diagnosis not present

## 2014-08-08 DIAGNOSIS — D631 Anemia in chronic kidney disease: Secondary | ICD-10-CM | POA: Diagnosis not present

## 2014-08-08 DIAGNOSIS — D509 Iron deficiency anemia, unspecified: Secondary | ICD-10-CM | POA: Diagnosis not present

## 2014-08-08 DIAGNOSIS — N186 End stage renal disease: Secondary | ICD-10-CM | POA: Diagnosis not present

## 2014-08-09 DIAGNOSIS — N186 End stage renal disease: Secondary | ICD-10-CM | POA: Diagnosis not present

## 2014-08-09 DIAGNOSIS — E8779 Other fluid overload: Secondary | ICD-10-CM | POA: Diagnosis not present

## 2014-08-10 DIAGNOSIS — N186 End stage renal disease: Secondary | ICD-10-CM | POA: Diagnosis not present

## 2014-08-10 DIAGNOSIS — D631 Anemia in chronic kidney disease: Secondary | ICD-10-CM | POA: Diagnosis not present

## 2014-08-10 DIAGNOSIS — D509 Iron deficiency anemia, unspecified: Secondary | ICD-10-CM | POA: Diagnosis not present

## 2014-08-15 DIAGNOSIS — I120 Hypertensive chronic kidney disease with stage 5 chronic kidney disease or end stage renal disease: Secondary | ICD-10-CM | POA: Diagnosis not present

## 2014-08-15 DIAGNOSIS — N186 End stage renal disease: Secondary | ICD-10-CM | POA: Diagnosis not present

## 2014-08-15 DIAGNOSIS — Z992 Dependence on renal dialysis: Secondary | ICD-10-CM | POA: Diagnosis not present

## 2014-08-17 DIAGNOSIS — Z992 Dependence on renal dialysis: Secondary | ICD-10-CM | POA: Diagnosis not present

## 2014-08-17 DIAGNOSIS — D631 Anemia in chronic kidney disease: Secondary | ICD-10-CM | POA: Diagnosis not present

## 2014-08-17 DIAGNOSIS — N186 End stage renal disease: Secondary | ICD-10-CM | POA: Diagnosis not present

## 2014-08-17 DIAGNOSIS — D509 Iron deficiency anemia, unspecified: Secondary | ICD-10-CM | POA: Diagnosis not present

## 2014-08-17 DIAGNOSIS — N2581 Secondary hyperparathyroidism of renal origin: Secondary | ICD-10-CM | POA: Diagnosis not present

## 2014-08-17 DIAGNOSIS — T82858D Stenosis of vascular prosthetic devices, implants and grafts, subsequent encounter: Secondary | ICD-10-CM | POA: Diagnosis not present

## 2014-08-17 DIAGNOSIS — I871 Compression of vein: Secondary | ICD-10-CM | POA: Diagnosis not present

## 2014-08-17 NOTE — Progress Notes (Signed)
Results in chart

## 2014-08-18 ENCOUNTER — Encounter: Payer: Self-pay | Admitting: Podiatry

## 2014-08-18 ENCOUNTER — Other Ambulatory Visit: Payer: Self-pay

## 2014-08-18 ENCOUNTER — Ambulatory Visit (INDEPENDENT_AMBULATORY_CARE_PROVIDER_SITE_OTHER): Payer: Medicare Other | Admitting: Podiatry

## 2014-08-18 VITALS — BP 130/82 | HR 82 | Resp 12

## 2014-08-18 DIAGNOSIS — T82510A Breakdown (mechanical) of surgically created arteriovenous fistula, initial encounter: Secondary | ICD-10-CM

## 2014-08-18 DIAGNOSIS — Z01810 Encounter for preprocedural cardiovascular examination: Secondary | ICD-10-CM

## 2014-08-18 DIAGNOSIS — N186 End stage renal disease: Secondary | ICD-10-CM

## 2014-08-18 DIAGNOSIS — L600 Ingrowing nail: Secondary | ICD-10-CM

## 2014-08-18 DIAGNOSIS — B351 Tinea unguium: Secondary | ICD-10-CM

## 2014-08-18 DIAGNOSIS — M79676 Pain in unspecified toe(s): Secondary | ICD-10-CM | POA: Diagnosis not present

## 2014-08-18 MED ORDER — CICLOPIROX 8 % EX SOLN: Freq: Every day | CUTANEOUS | Status: DC

## 2014-08-18 NOTE — Progress Notes (Signed)
Patient ID: [name], male   DOB: [birth year], 54 y.o.   MRN: [id number]  Subjective: 54 y.o.-year-old male  returns the office today for painful, elongated, thickened toenails which is difficult for him to trim. He also stats that the right big toenail is not going out correctly. Denies any redness or drainage around the nails. Denies any acute changes since last appointment and no new complaints today. Denies any systemic complaints such as fevers, chills, nausea, vomiting.   Objective: AAO 3, NAD DP/PT pulses palpable, CRT less than 3 seconds Protective sensation intact with Simms Weinstein monofilament, Achilles tendon reflex intact.  Nails hypertrophic, dystrophic, elongated, brittle, discolored 10. All the nails are mildly ingrown. There is tenderness overlying the nails 1-5 bilaterally. There is no surrounding erythema or drainage along the nail sites. The right hallux nail is growing in straight although it is thick.  No open lesions or pre-ulcerative lesions are identified. No other areas of tenderness bilateral lower extremities. No overlying edema, erythema, increased warmth. No pain with calf compression, swelling, warmth, erythema.  Assessment: Patient presents with symptomatic onychomycosis  Plan: -Treatment options including alternatives, risks, complications were discussed -Nails sharply debrided 10 without complication/bleeding. -Discussed various treatments for onychomycosis. He will consider Penlac vs. OTC treatment. A prescription for penlac was provided today.  -Discussed daily foot inspection. If there are any changes, to call the office immediately.  -Follow-up in 3 months or sooner if any problems are to arise. In the meantime, encouraged to call the office with any questions, concerns, changes symptoms.

## 2014-08-19 DIAGNOSIS — D509 Iron deficiency anemia, unspecified: Secondary | ICD-10-CM | POA: Diagnosis not present

## 2014-08-19 DIAGNOSIS — D631 Anemia in chronic kidney disease: Secondary | ICD-10-CM | POA: Diagnosis not present

## 2014-08-19 DIAGNOSIS — N2581 Secondary hyperparathyroidism of renal origin: Secondary | ICD-10-CM | POA: Diagnosis not present

## 2014-08-19 DIAGNOSIS — N186 End stage renal disease: Secondary | ICD-10-CM | POA: Diagnosis not present

## 2014-08-22 ENCOUNTER — Other Ambulatory Visit: Payer: Self-pay

## 2014-08-22 DIAGNOSIS — N186 End stage renal disease: Secondary | ICD-10-CM | POA: Diagnosis not present

## 2014-08-22 DIAGNOSIS — D631 Anemia in chronic kidney disease: Secondary | ICD-10-CM | POA: Diagnosis not present

## 2014-08-22 DIAGNOSIS — N2581 Secondary hyperparathyroidism of renal origin: Secondary | ICD-10-CM | POA: Diagnosis not present

## 2014-08-22 DIAGNOSIS — D509 Iron deficiency anemia, unspecified: Secondary | ICD-10-CM | POA: Diagnosis not present

## 2014-08-22 MED ORDER — ZOLPIDEM TARTRATE 10 MG PO TABS: 10.0000 mg | ORAL_TABLET | Freq: Every evening | ORAL | Status: DC | PRN

## 2014-08-22 NOTE — Telephone Encounter (Signed)
Done hardcopy to Dahlia  

## 2014-08-22 NOTE — Telephone Encounter (Signed)
Rx faxed to pharmacy  

## 2014-08-24 DIAGNOSIS — N2581 Secondary hyperparathyroidism of renal origin: Secondary | ICD-10-CM | POA: Diagnosis not present

## 2014-08-24 DIAGNOSIS — D631 Anemia in chronic kidney disease: Secondary | ICD-10-CM | POA: Diagnosis not present

## 2014-08-24 DIAGNOSIS — D509 Iron deficiency anemia, unspecified: Secondary | ICD-10-CM | POA: Diagnosis not present

## 2014-08-24 DIAGNOSIS — N186 End stage renal disease: Secondary | ICD-10-CM | POA: Diagnosis not present

## 2014-08-26 DIAGNOSIS — N2581 Secondary hyperparathyroidism of renal origin: Secondary | ICD-10-CM | POA: Diagnosis not present

## 2014-08-26 DIAGNOSIS — D509 Iron deficiency anemia, unspecified: Secondary | ICD-10-CM | POA: Diagnosis not present

## 2014-08-26 DIAGNOSIS — D631 Anemia in chronic kidney disease: Secondary | ICD-10-CM | POA: Diagnosis not present

## 2014-08-26 DIAGNOSIS — N186 End stage renal disease: Secondary | ICD-10-CM | POA: Diagnosis not present

## 2014-08-29 ENCOUNTER — Encounter: Payer: Self-pay | Admitting: Surgery

## 2014-08-29 DIAGNOSIS — N186 End stage renal disease: Secondary | ICD-10-CM | POA: Diagnosis not present

## 2014-08-29 DIAGNOSIS — D509 Iron deficiency anemia, unspecified: Secondary | ICD-10-CM | POA: Diagnosis not present

## 2014-08-29 DIAGNOSIS — N2581 Secondary hyperparathyroidism of renal origin: Secondary | ICD-10-CM | POA: Diagnosis not present

## 2014-08-29 DIAGNOSIS — D631 Anemia in chronic kidney disease: Secondary | ICD-10-CM | POA: Diagnosis not present

## 2014-08-31 DIAGNOSIS — N186 End stage renal disease: Secondary | ICD-10-CM | POA: Diagnosis not present

## 2014-08-31 DIAGNOSIS — N2581 Secondary hyperparathyroidism of renal origin: Secondary | ICD-10-CM | POA: Diagnosis not present

## 2014-08-31 DIAGNOSIS — D509 Iron deficiency anemia, unspecified: Secondary | ICD-10-CM | POA: Diagnosis not present

## 2014-08-31 DIAGNOSIS — D631 Anemia in chronic kidney disease: Secondary | ICD-10-CM | POA: Diagnosis not present

## 2014-09-01 ENCOUNTER — Ambulatory Visit (INDEPENDENT_AMBULATORY_CARE_PROVIDER_SITE_OTHER): Payer: Medicare Other | Admitting: Surgery

## 2014-09-01 ENCOUNTER — Ambulatory Visit (INDEPENDENT_AMBULATORY_CARE_PROVIDER_SITE_OTHER)
Admission: RE | Admit: 2014-09-01 | Discharge: 2014-09-01 | Disposition: A | Discharge status: Home / Self Care | Payer: Medicare Other | Source: Ambulatory Visit | Attending: Surgery | Admitting: Surgery

## 2014-09-01 ENCOUNTER — Ambulatory Visit (HOSPITAL_COMMUNITY)
Admission: RE | Admit: 2014-09-01 | Discharge: 2014-09-01 | Disposition: A | Discharge status: Home / Self Care | Payer: Medicare Other | Source: Ambulatory Visit | Attending: Surgery | Admitting: Surgery

## 2014-09-01 ENCOUNTER — Encounter: Payer: Self-pay | Admitting: Surgery

## 2014-09-01 ENCOUNTER — Other Ambulatory Visit: Payer: Self-pay

## 2014-09-01 VITALS — BP 190/113 | HR 132 | Resp 16 | Ht 72.0 in | Wt 225.0 lb

## 2014-09-01 DIAGNOSIS — T82510A Breakdown (mechanical) of surgically created arteriovenous fistula, initial encounter: Secondary | ICD-10-CM

## 2014-09-01 DIAGNOSIS — N186 End stage renal disease: Secondary | ICD-10-CM

## 2014-09-01 DIAGNOSIS — Z01810 Encounter for preprocedural cardiovascular examination: Secondary | ICD-10-CM

## 2014-09-01 DIAGNOSIS — Y832 Surgical operation with anastomosis, bypass or graft as the cause of abnormal reaction of the patient, or of later complication, without mention of misadventure at the time of the procedure: Secondary | ICD-10-CM | POA: Diagnosis not present

## 2014-09-01 NOTE — Progress Notes (Signed)
Patient name: [name] MRN: [id number] DOB: 03/29/1960 Sex: male     Chief Complaint  Patient presents with  . New Evaluation    clotted AVF  referred by Dr Juel Burrow  referral for new access    HISTORY OF PRESENT ILLNESS: The patient comes in today for dialysis access.  He has a history of a left brachiocephalic fistula which has recently clotted.  He is right-handed.  He now has a right-sided catheter in place.  The patient has a history of coronary angioplasty in 2013.  He suffers from hyperlipidemia which is managed with a statin.  Past Medical History  Diagnosis Date  . ACHALASIA   . CEREBROVASCULAR ACCIDENT, HX OF   . Headache(784.0)   . HYPERLIPIDEMIA     takes crestor daily  . RENAL CALCULUS, HX OF   . RENAL INSUFFICIENCY   . H/O hiatal hernia   . CAD (coronary artery disease), 3 vessel significant disease.    . Impaired glucose tolerance   . S/P PTCA (percutaneous transluminal coronary angioplasty), 06/30/11 07/01/2011  . CKD (chronic kidney disease) stage 4, GFR 15-29 ml/min   . History of diastolic dysfunction, grade 2 by echo   . S/P coronary artery stent placement, PTCA/DES Resolute to mid-LAD via the LIMA graft, and PTCA of apical 95% stenosis 07/05/11 07/04/2011  . DEPRESSION   . Sleep apnea     SLEEP STUDY IN [place] , NO MACHINE YET  1 YEAR  . Seizures     INSULIN INDUCED  . ANEMIA-NOS   . NSTEMI (non-ST elevated myocardial infarction), 06/30/11 07/01/2011  . Stroke     93/2005/2006/2007;left sided weakness  . DEGENERATIVE JOINT DISEASE, SHOULDER     knee  . Gout     takes Uloric daily  . GERD     no meds required  . History of colon polyps   . HYPERTENSION     takes Norvasc,Catapress,Hydralazine,Imdur,and Metoprolol daily  . Insomnia     takes Ambien nightly  . Hyperlipidemia     Past Surgical History  Procedure Laterality Date  . Tonsillectomy    . Stomach surgery  2009    achalasia  . Knee surgery  01/07/2011    right  . Insertion of  dialysis catheter  04/01/2011    Procedure: INSERTION OF DIALYSIS CATHETER;  Surgeon: Nilda Simmer, MD;  Location: [facility] OR;  Service: Vascular;  Laterality: N/A;  . Cardiac catheterization    . Av fistula placement  12/02/2011    Procedure: ARTERIOVENOUS (AV) FISTULA CREATION;  Surgeon: Fransisco Hertz, MD;  Location[facility] OR;  Service: Vascular;  Laterality: Left;  Ultrasound Guided  . Subclavian stent placement  12/20/2011  . Coronary artery bypass graft  04/01/2011    Procedure: CORONARY ARTERY BYPASS GRAFTING (CABG);  Surgeon: Kathlee Nations Suann Larry, MD;  Location: [facility] OR;  Service: Open Heart Surgery;  Laterality: N/A;  . [facility]      [facility]  . Colonoscopy    . Hyperlipidemia    . Ligation of arteriovenous  fistula Left 06/08/2012    Procedure: LIGATION OF ARTERIOVENOUS  FISTULA;  Surgeon: Fransisco Hertz, MD;  Location: [facility] OR;  Service: Vascular;  Laterality: Left;  . Av fistula placement Left 06/08/2012    Procedure: ARTERIOVENOUS (AV) FISTULA CREATION;  Surgeon: Fransisco Hertz, MD;  Location: [facility] OR;  Service: Vascular;  Laterality: Left;  . Left heart catheterization with coronary angiogram N/A 03/28/2011  Procedure: LEFT HEART CATHETERIZATION WITH CORONARY ANGIOGRAM;  Surgeon: Lennette Bihari, MD;  Location: [facility] CATH LAB;  Service: Cardiovascular;  Laterality: N/A;  pending creatnine  . Left heart catheterization with coronary/graft angiogram N/A 06/30/2011    Procedure: LEFT HEART CATHETERIZATION WITH Isabel Caprice;  Surgeon: Thurmon Fair, MD;  Location: [facility] CATH LAB;  Service: Cardiovascular;  Laterality: N/A;  . Percutaneous coronary intervention-balloon only  06/30/2011    Procedure: PERCUTANEOUS CORONARY INTERVENTION-BALLOON ONLY;  Surgeon: Thurmon Fair, MD;  Location: [facility] CATH LAB;  Service: Cardiovascular;;  . Percutaneous coronary stent intervention (pci-s) N/A 07/04/2011    Procedure: PERCUTANEOUS CORONARY STENT INTERVENTION (PCI-S);  Surgeon: Lennette Bihari,  MD;  Location: [facility] CATH LAB;  Service: Cardiovascular;  Laterality: N/A;  . Arch aortogram N/A 02/19/2012    Procedure: ARCH AORTOGRAM;  Surgeon: Fransisco Hertz, MD;  Location: [facility] CATH LAB;  Service: Cardiovascular;  Laterality: N/A;    History   Social History  . Marital Status: Divorced    Spouse Name: N/A  . Number of Children: N/A  . Years of Education: N/A   Occupational History  . Not on file.   Social History Main Topics  . Smoking status: Never Smoker   . Smokeless tobacco: Former Neurosurgeon    Types: Chew    Quit date: 03/18/2003  . Alcohol Use: No     Comment: rarely  . Drug Use: No  . Sexual Activity: Not Currently   Other Topics Concern  . Not on file   Social History Narrative    Family History  Problem Relation Age of Onset  . Hypertension Other   . Stroke Other   . Hyperlipidemia Other   . Cancer Mother   . Heart disease Mother   . Hyperlipidemia Mother   . Hypertension Mother   . Cancer Father   . Heart disease Father     Allergies as of 09/01/2014 - Review Complete 09/01/2014  Allergen Reaction Noted  . Shrimp [shellfish allergy] Shortness Of Breath 03/27/2011  . Insulins Other (See Comments) 04/01/2011  . Atorvastatin Other (See Comments) 05/22/2008  . Simvastatin Other (See Comments) 05/22/2008  . Ultram [tramadol] Other (See Comments) 09/10/2011    Current Outpatient Prescriptions on File Prior to Visit  Medication Sig Dispense Refill  . acetaminophen (TYLENOL) 500 MG tablet Take 1,000 mg by mouth every 6 (six) hours as needed (pain).    Marland Kitchen aspirin EC 81 MG EC tablet Take 1 tablet (81 mg total) by mouth daily.    . cetirizine (ZYRTEC) 10 MG tablet Take 10 mg by mouth at bedtime.    . ciclopirox (PENLAC) 8 % solution Apply topically at bedtime. Apply over nail and surrounding skin. Apply daily over previous coat. After seven (7) days, may remove with alcohol and continue cycle. 6.6 mL 2  . cloNIDine (CATAPRES) 0.2 MG tablet Take 0.2 mg by mouth 2  (two) times daily.     . clopidogrel (PLAVIX) 75 MG tablet Take 1 tablet (75 mg total) by mouth at bedtime. 90 tablet 3  . febuxostat (ULORIC) 40 MG tablet Take 40 mg by mouth at bedtime.     Marland Kitchen GREEN TEA, CAMILLIA SINENSIS, PO Take 2 capsules by mouth daily.    . hydrALAZINE (APRESOLINE) 10 MG tablet Take 10 mg by mouth 2 (two) times daily.    . isosorbide mononitrate (IMDUR) 30 MG 24 hr tablet Take 1 tablet (30 mg total) by mouth daily. 30 tablet 6  . KRILL OIL PO  Take 3 capsules by mouth daily.    . metoprolol (LOPRESSOR) 50 MG tablet TAKE 1 TABLET BY MOUTH TWICE A DAY 180 tablet 3  . multivitamin (RENA-VIT) TABS tablet Take 1 tablet by mouth daily.    Marland Kitchen NITROSTAT 0.4 MG SL tablet PLACE 1 TABLET UNDER TONGUE AS NEEDED FOR CHEST PAIN EVERY 5 MINUTES (MAX 3DOSES) 25 tablet 4  . OVER THE COUNTER MEDICATION Take 1 tablet by mouth at bedtime. HIGH POTASSIUM LEVEL MEDICATION    . OVER THE COUNTER MEDICATION Take 1 tablet by mouth at bedtime. PROSTATE SUPPLEMENT    . polysaccharide iron (NIFEREX) 150 MG CAPS capsule Take 150 mg by mouth at bedtime.     . rosuvastatin (CRESTOR) 20 MG tablet Take 1 tablet (20 mg total) by mouth every evening. 90 tablet 3  . terazosin (HYTRIN) 10 MG capsule TAKE 1 CAPSULE BY MOUTH AT BEDTIME. 90 capsule 2  . ticagrelor (BRILINTA) 90 MG TABS tablet Take 90 mg by mouth daily.    Marland Kitchen zolpidem (AMBIEN) 10 MG tablet Take 1 tablet (10 mg total) by mouth at bedtime as needed for sleep. 30 tablet 5  . levofloxacin (LEVAQUIN) 250 MG tablet Take 1 tablet (250 mg total) by mouth every other day. (Patient not taking: Reported on 09/01/2014) 4 tablet 0  . oxyCODONE (ROXICODONE) 5 MG immediate release tablet Take 1 tablet (5 mg total) by mouth every 4 (four) hours as needed for pain. (Patient not taking: Reported on 09/01/2014) 30 tablet 0  . ranolazine (RANEXA) 500 MG 12 hr tablet Take 1 tablet (500 mg total) by mouth every morning. 90 tablet 1  . terazosin (HYTRIN) 5 MG capsule Take 1  capsule (5 mg total) by mouth at bedtime. (Patient not taking: Reported on 09/01/2014) 30 capsule 1   No current facility-administered medications on file prior to visit.     REVIEW OF SYSTEMS: Cardiovascular: No chest pain, chest pressure, palpitations, orthopnea, or dyspnea on exertion. No claudication or rest pain,  No history of DVT or phlebitis. Pulmonary: No productive cough, asthma or wheezing. Neurologic: No weakness, paresthesias, aphasia, or amaurosis. No dizziness. Hematologic: No bleeding problems or clotting disorders. Musculoskeletal: No joint pain or joint swelling. Gastrointestinal: No blood in stool or hematemesis Genitourinary: No dysuria or hematuria. Psychiatric:: No history of major depression. Integumentary: No rashes or ulcers. Constitutional: No fever or chills.  PHYSICAL EXAMINATION:   Vital signs are  Filed Vitals:   09/01/14 1037 09/01/14 1046  BP: 187/124 190/113  Pulse: 138 132  Resp: 16   Height: 6' (1.829 m)   Weight: 225 lb (102.059 kg)    Body mass index is 30.51 kg/(m^2). General: The patient appears their stated age. HEENT:  No gross abnormalities Pulmonary:  Non labored breathing Musculoskeletal: There are no major deformities. Neurologic: No focal weakness or paresthesias are detected, Skin: There are no ulcer or rashes noted. Psychiatric: The patient has normal affect. Cardiovascular: Occluded left brachiocephalic fistula   Diagnostic Studies I have reviewed his vein mapping.  He has marginal basilic veins bilaterally.  Assessment: End stage renal disease Plan: I discussed proceeding with left arm access.  I said we would plan to do a first stage left basilic vein transposition if the basilic vein seems reasonable in the operating room.  He understands that this would be part 1 of a two-stage procedure.  If his veins are completely not usable, he would get a Gore-Tex graft.  This is been scheduled for this coming Monday.  He tells me  he only takes an aspirin but his medication shows other antiplatelets therapy which I will make sure he discontinues.  Jorge Ny, M.D. [facility] Office: [phone] Pager:  [phone]

## 2014-09-02 DIAGNOSIS — N186 End stage renal disease: Secondary | ICD-10-CM | POA: Diagnosis not present

## 2014-09-02 DIAGNOSIS — N2581 Secondary hyperparathyroidism of renal origin: Secondary | ICD-10-CM | POA: Diagnosis not present

## 2014-09-02 DIAGNOSIS — D509 Iron deficiency anemia, unspecified: Secondary | ICD-10-CM | POA: Diagnosis not present

## 2014-09-02 DIAGNOSIS — D631 Anemia in chronic kidney disease: Secondary | ICD-10-CM | POA: Diagnosis not present

## 2014-09-04 ENCOUNTER — Encounter: Payer: Self-pay | Admitting: Nephrology

## 2014-09-05 ENCOUNTER — Encounter (HOSPITAL_COMMUNITY): Payer: Self-pay | Admitting: *Deleted

## 2014-09-05 DIAGNOSIS — N2581 Secondary hyperparathyroidism of renal origin: Secondary | ICD-10-CM | POA: Diagnosis not present

## 2014-09-05 DIAGNOSIS — D509 Iron deficiency anemia, unspecified: Secondary | ICD-10-CM | POA: Diagnosis not present

## 2014-09-05 DIAGNOSIS — N186 End stage renal disease: Secondary | ICD-10-CM | POA: Diagnosis not present

## 2014-09-05 DIAGNOSIS — D631 Anemia in chronic kidney disease: Secondary | ICD-10-CM | POA: Diagnosis not present

## 2014-09-05 MED ORDER — DEXTROSE 5 % IV SOLN
1.5000 g | INTRAVENOUS | Status: AC
  Administered 2014-09-06: 1.5 g via INTRAVENOUS
  Filled 2014-09-05: qty 1.5

## 2014-09-05 MED ORDER — SODIUM CHLORIDE 0.9 % IV SOLN
INTRAVENOUS | Status: DC
  Administered 2014-09-06: 12:00:00 via INTRAVENOUS

## 2014-09-05 NOTE — Progress Notes (Signed)
Anesthesia Chart Review:  Pt is 54 year old male scheduled for first stage basilic vein transposition vs AV graft insertion on 09/06/2014 with Dr. Imogene Burn.   Pt is same day work up.   Cardiologist Dr. Allyson Sabal, last office visit 08/09/2013.   PMH includes: CVA (most recent 12/2013, also 2005, 2006, 2007), hyperlipidemia, S/p CABG 04/01/11 (LIMA to LAD, vein to OM1, OM2 and posterolateral branch), CAD (06/2011 PCI of his OM vein graft insertion which was too small to stent, and stenting of the LAD beyond his LIMA insertion), CKD, non STEMI (06/2011), HTN, hyperlipidemia, seizures. Never smoker. BMI 30.5.  Medications include: ASA, clonidine, plavix, imdur, metoprolol, ranexa, crestor, brilinta. Pt is to stop plavix and brilinta 09/01/2014.   EKG 06/08/2014: Sinus rhythm with occasional PVCs. LAFB. Cannot rule out Inferior infarct, age undetermined. Anterolateral infarct, age undetermined  Echo 12/31/2013: - Left ventricle: The cavity size was normal. Wall thickness wasincreased in a pattern of severe LVH. There was severe concentrichypertrophy. Systolic function was vigorous. The estimatedejection fraction was in the range of 65% to 70%. Wall motion wasnormal; there were no regional wall motion abnormalities. Dopplerparameters are consistent with abnormal left ventricularrelaxation (grade 1 diastolic dysfunction). - Aortic valve: There was trivial regurgitation. Valve area (Vmax):4.77 cm^2. - Left atrium: The atrium was moderately to severely dilated. - Right atrium: The atrium was mildly dilated. - Atrial septum: No defect or patent foramen ovale was identified. - Pericardium, extracardiac: A trivial pericardial effusion was identified posterior to the heart. Features were not consistent with tamponade physiology.  Nuclear stress test 06/28/2013: Low risk stress nuclear study Mild anterior ischemia. LV Wall Motion: Severe Global LV dysfunction -Pt saw Dr. Allyson Sabal after stress test who notes "He's had  no recurrent symptoms. He does have stage V chronic renal insufficiency with a maturing AV fistula. He sees Dr. Elvis Coil monthly. At this point, I told her conservative therapy was having intervention with exposure to nephrotoxic agents for recurrent unstable symptoms."  Reviewed case with Dr. Katrinka Blazing.   If no changes, I anticipate pt can proceed with surgery as scheduled.   Rica Mast, FNP-BC [facility]/Anesthesiology Phone: [phone] 09/05/2014 2:02 PM

## 2014-09-05 NOTE — Progress Notes (Addendum)
Mr Willinger's BP was 190/113 and 187/124 in Dr Nicky Pugh office on 09/01/14.  Patient states it has been normal since then.  Instructed patient to take BP Medications per instructions.

## 2014-09-05 NOTE — Progress Notes (Signed)
   09/05/14 1531  OBSTRUCTIVE SLEEP APNEA  Have you ever been diagnosed with sleep apnea through a sleep study? No  Do you snore loudly (loud enough to be heard through closed doors)?  1  Do you often feel tired, fatigued, or sleepy during the daytime? 0  Has anyone observed you stop breathing during your sleep? 1  Do you have, or are you being treated for high blood pressure? 1  BMI more than 35 kg/m2? 0  Age over 54 years old? 1  Gender: 1

## 2014-09-05 NOTE — Progress Notes (Signed)
This patient has screened at an elevated risk for obstructive sleep apnea, using  The STOP - Bang tool during a pre-surgical visit.

## 2014-09-06 ENCOUNTER — Ambulatory Visit (HOSPITAL_COMMUNITY)
Admission: RE | Admit: 2014-09-06 | Discharge: 2014-09-06 | Disposition: A | Discharge status: Home / Self Care | Payer: Medicare Other | Source: Ambulatory Visit | Attending: Vascular Surgery | Admitting: Vascular Surgery

## 2014-09-06 ENCOUNTER — Ambulatory Visit (HOSPITAL_COMMUNITY): Payer: Medicare Other | Admitting: Emergency Medicine

## 2014-09-06 ENCOUNTER — Encounter (HOSPITAL_COMMUNITY): Payer: Self-pay | Admitting: Certified Registered"

## 2014-09-06 ENCOUNTER — Encounter (HOSPITAL_COMMUNITY)
Admission: RE | Disposition: A | Discharge status: Home / Self Care | Payer: Self-pay | Source: Ambulatory Visit | Attending: Vascular Surgery

## 2014-09-06 DIAGNOSIS — D649 Anemia, unspecified: Secondary | ICD-10-CM | POA: Diagnosis not present

## 2014-09-06 DIAGNOSIS — I252 Old myocardial infarction: Secondary | ICD-10-CM | POA: Insufficient documentation

## 2014-09-06 DIAGNOSIS — Z8673 Personal history of transient ischemic attack (TIA), and cerebral infarction without residual deficits: Secondary | ICD-10-CM | POA: Diagnosis not present

## 2014-09-06 DIAGNOSIS — N186 End stage renal disease: Secondary | ICD-10-CM | POA: Diagnosis not present

## 2014-09-06 DIAGNOSIS — Z955 Presence of coronary angioplasty implant and graft: Secondary | ICD-10-CM | POA: Diagnosis not present

## 2014-09-06 DIAGNOSIS — E785 Hyperlipidemia, unspecified: Secondary | ICD-10-CM | POA: Insufficient documentation

## 2014-09-06 DIAGNOSIS — N185 Chronic kidney disease, stage 5: Secondary | ICD-10-CM | POA: Diagnosis not present

## 2014-09-06 DIAGNOSIS — I120 Hypertensive chronic kidney disease with stage 5 chronic kidney disease or end stage renal disease: Secondary | ICD-10-CM | POA: Diagnosis not present

## 2014-09-06 HISTORY — DX: Constipation, unspecified: K59.00

## 2014-09-06 HISTORY — DX: Family history of other specified conditions: Z84.89

## 2014-09-06 HISTORY — PX: BASCILIC VEIN TRANSPOSITION: SHX5742

## 2014-09-06 LAB — POCT I-STAT 4, (NA,K, GLUC, HGB,HCT)
Glucose, Bld: 106 mg/dL — ABNORMAL HIGH (ref 65–99)
HCT: 36 % — ABNORMAL LOW (ref 39.0–52.0)
Hemoglobin: 12.2 g/dL — ABNORMAL LOW (ref 13.0–17.0)
POTASSIUM: 4.1 mmol/L (ref 3.5–5.1)
Sodium: 136 mmol/L (ref 135–145)

## 2014-09-06 SURGERY — TRANSPOSITION, VEIN, BASILIC
Anesthesia: Monitor Anesthesia Care | Laterality: Left

## 2014-09-06 MED ORDER — MIDAZOLAM HCL 2 MG/2ML IJ SOLN
INTRAMUSCULAR | Status: AC
  Filled 2014-09-06: qty 2

## 2014-09-06 MED ORDER — PROPOFOL 10 MG/ML IV BOLUS
INTRAVENOUS | Status: AC
  Filled 2014-09-06: qty 20

## 2014-09-06 MED ORDER — LIDOCAINE-EPINEPHRINE (PF) 1 %-1:200000 IJ SOLN
INTRAMUSCULAR | Status: DC | PRN
  Administered 2014-09-06: 30 mL

## 2014-09-06 MED ORDER — LIDOCAINE HCL (CARDIAC) 20 MG/ML IV SOLN
INTRAVENOUS | Status: AC
  Filled 2014-09-06: qty 5

## 2014-09-06 MED ORDER — BUPIVACAINE HCL (PF) 0.5 % IJ SOLN
INTRAMUSCULAR | Status: DC | PRN
  Administered 2014-09-06: 30 mL

## 2014-09-06 MED ORDER — FENTANYL CITRATE (PF) 100 MCG/2ML IJ SOLN
INTRAMUSCULAR | Status: DC | PRN
  Administered 2014-09-06: 100 ug via INTRAVENOUS

## 2014-09-06 MED ORDER — 0.9 % SODIUM CHLORIDE (POUR BTL) OPTIME
TOPICAL | Status: DC | PRN
  Administered 2014-09-06: 1000 mL

## 2014-09-06 MED ORDER — OXYCODONE HCL 5 MG/5ML PO SOLN: 5.0000 mg | Freq: Once | ORAL | Status: AC | PRN

## 2014-09-06 MED ORDER — THROMBIN 20000 UNITS EX SOLR
CUTANEOUS | Status: AC
  Filled 2014-09-06: qty 20000

## 2014-09-06 MED ORDER — SODIUM CHLORIDE 0.9 % IR SOLN
Status: DC | PRN
  Administered 2014-09-06: 500 mL

## 2014-09-06 MED ORDER — FENTANYL CITRATE (PF) 100 MCG/2ML IJ SOLN: 25.0000 ug | INTRAMUSCULAR | Status: DC | PRN

## 2014-09-06 MED ORDER — OXYCODONE HCL 5 MG PO TABS: 5.0000 mg | ORAL_TABLET | ORAL | Status: DC | PRN

## 2014-09-06 MED ORDER — MIDAZOLAM HCL 5 MG/5ML IJ SOLN
INTRAMUSCULAR | Status: DC | PRN
  Administered 2014-09-06: 2 mg via INTRAVENOUS

## 2014-09-06 MED ORDER — PROPOFOL INFUSION 10 MG/ML OPTIME
INTRAVENOUS | Status: DC | PRN
  Administered 2014-09-06: 100 ug/kg/min via INTRAVENOUS

## 2014-09-06 MED ORDER — OXYCODONE HCL 5 MG PO TABS
ORAL_TABLET | ORAL | Status: AC
  Filled 2014-09-06: qty 1

## 2014-09-06 MED ORDER — LIDOCAINE-EPINEPHRINE (PF) 1 %-1:200000 IJ SOLN
INTRAMUSCULAR | Status: AC
  Filled 2014-09-06: qty 10

## 2014-09-06 MED ORDER — FENTANYL CITRATE (PF) 250 MCG/5ML IJ SOLN
INTRAMUSCULAR | Status: AC
  Filled 2014-09-06: qty 5

## 2014-09-06 MED ORDER — CHLORHEXIDINE GLUCONATE CLOTH 2 % EX PADS: 6.0000 | MEDICATED_PAD | Freq: Once | CUTANEOUS | Status: DC

## 2014-09-06 MED ORDER — PHENYLEPHRINE HCL 10 MG/ML IJ SOLN
INTRAMUSCULAR | Status: DC | PRN
  Administered 2014-09-06: 120 ug via INTRAVENOUS

## 2014-09-06 MED ORDER — LIDOCAINE HCL (CARDIAC) 20 MG/ML IV SOLN
INTRAVENOUS | Status: DC | PRN
  Administered 2014-09-06: 60 mg via INTRAVENOUS

## 2014-09-06 MED ORDER — OXYCODONE HCL 5 MG PO TABS
5.0000 mg | ORAL_TABLET | Freq: Once | ORAL | Status: AC | PRN
  Administered 2014-09-06: 5 mg via ORAL

## 2014-09-06 SURGICAL SUPPLY — 39 items
CANISTER SUCTION 2500CC (MISCELLANEOUS) ×3 IMPLANT
CLIP TI MEDIUM 24 (CLIP) ×3 IMPLANT
CLIP TI WIDE RED SMALL 24 (CLIP) ×3 IMPLANT
CLIP TI WIDE RED SMALL 6 (CLIP) ×3 IMPLANT
CORDS BIPOLAR (ELECTRODE) IMPLANT
COVER PROBE W GEL 5X96 (DRAPES) IMPLANT
DECANTER SPIKE VIAL GLASS SM (MISCELLANEOUS) ×3 IMPLANT
DRSG COVADERM 4X10 (GAUZE/BANDAGES/DRESSINGS) IMPLANT
DRSG COVADERM 4X8 (GAUZE/BANDAGES/DRESSINGS) IMPLANT
ELECT REM PT RETURN 9FT ADLT (ELECTROSURGICAL) ×3
ELECTRODE REM PT RTRN 9FT ADLT (ELECTROSURGICAL) ×1 IMPLANT
GLOVE BIO SURGEON STRL SZ 6.5 (GLOVE) ×4 IMPLANT
GLOVE BIO SURGEON STRL SZ7 (GLOVE) ×6 IMPLANT
GLOVE BIO SURGEONS STRL SZ 6.5 (GLOVE) ×2
GLOVE BIOGEL PI IND STRL 6.5 (GLOVE) ×1 IMPLANT
GLOVE BIOGEL PI IND STRL 7.5 (GLOVE) ×1 IMPLANT
GLOVE BIOGEL PI INDICATOR 6.5 (GLOVE) ×2
GLOVE BIOGEL PI INDICATOR 7.5 (GLOVE) ×2
GLOVE ECLIPSE 6.5 STRL STRAW (GLOVE) ×3 IMPLANT
GOWN STRL REUS W/ TWL LRG LVL3 (GOWN DISPOSABLE) ×4 IMPLANT
GOWN STRL REUS W/TWL LRG LVL3 (GOWN DISPOSABLE) ×8
KIT BASIN OR (CUSTOM PROCEDURE TRAY) ×3 IMPLANT
KIT ROOM TURNOVER OR (KITS) ×3 IMPLANT
LIQUID BAND (GAUZE/BANDAGES/DRESSINGS) ×3 IMPLANT
NS IRRIG 1000ML POUR BTL (IV SOLUTION) ×3 IMPLANT
PACK CV ACCESS (CUSTOM PROCEDURE TRAY) ×3 IMPLANT
PAD ARMBOARD 7.5X6 YLW CONV (MISCELLANEOUS) ×6 IMPLANT
SPONGE SURGIFOAM ABS GEL 100 (HEMOSTASIS) IMPLANT
STAPLER VISISTAT 35W (STAPLE) IMPLANT
SUT MNCRL AB 4-0 PS2 18 (SUTURE) ×3 IMPLANT
SUT PROLENE 6 0 BV (SUTURE) ×3 IMPLANT
SUT PROLENE 7 0 BV 1 (SUTURE) ×3 IMPLANT
SUT SILK 2 0 SH (SUTURE) ×3 IMPLANT
SUT VIC AB 2-0 CT1 27 (SUTURE) ×2
SUT VIC AB 2-0 CT1 TAPERPNT 27 (SUTURE) ×1 IMPLANT
SUT VIC AB 3-0 SH 27 (SUTURE) ×4
SUT VIC AB 3-0 SH 27X BRD (SUTURE) ×2 IMPLANT
UNDERPAD 30X30 INCONTINENT (UNDERPADS AND DIAPERS) ×3 IMPLANT
WATER STERILE IRR 1000ML POUR (IV SOLUTION) ×3 IMPLANT

## 2014-09-06 NOTE — Op Note (Signed)
OPERATIVE NOTE   PROCEDURE: 1. left first stage brachial vein transposition (brachiobrachial arteriovenous fistula) placement  PRE-OPERATIVE DIAGNOSIS: end stage renal disease   POST-OPERATIVE DIAGNOSIS: same as above   SURGEON: Leonides Sake, MD  ASSISTANT(S): Doreatha Massed, PAC   ANESTHESIA: local and MAC  ESTIMATED BLOOD LOSS: 50 cc  FINDING(S): 1. Small basilic vein, inadequate length 2. 3-4 mm brachial vein 3. Good thrill in brachiobrachial fistula at end of case 4. Faint radial pulse with dopplerable radial signal  SPECIMEN(S):  none  INDICATIONS:   [name] is a 54 y.o. male who presents with end stage renal disease after thrombosis of left brachiocephalic arteriovenous fistula.  The patient is scheduled for left first stage basilic vein transposition vs left upper arm arteriovenous graft.  The patient is aware the risks include but are not limited to: bleeding, infection, steal syndrome, nerve damage, ischemic monomelic neuropathy, failure to mature, and need for additional procedures.  The patient is aware of the risks of the procedure and elects to proceed forward.  DESCRIPTION: After full informed written consent was obtained from the patient, the patient was brought back to the operating room and placed supine upon the operating table.  Prior to induction, the patient received IV antibiotics.   After obtaining adequate anesthesia, the patient was then prepped and draped in the standard fashion for a left arm access procedure.  I turned my attention first to identifying the patient's basilic vein, brachial vein and brachial artery.  I could not see an adequate brachial vein, so I marked the location of the basilic vein.  The forearm component did not look big enough for use for fistula construction.  Subsequently, I fel that a left upper arm arteriovenous graft was going to be necessary.  At this point, I injected local anesthetic to obtain a field block of the  antecubitum.  In total, I injected about 10 mL of a 1:1 mixture of 0.5% Marcaine without epinephrine and 1% lidocaine with epinephrine.  I made a longitudinal incision over the brachial artery and dissected through the subcutaneous tissue and fascia to gain exposure of the brachial artery.  This was noted to be 4 mm in diameter externally.  This was dissected out proximally and distally and controlled with vessel loops .  In this process, I found a brachial vein bigger than seen with Sonosite.  I then dissected out the brachial vein.  This was noted to be 3-4 mm in diameter externally.  The distal segment of the vein was ligated with a  2-0 silk, and the vein was transected.  The proximal segment was iinterrogated with serial dilators.  The vein accepted up to a 4 mm dilator without any difficulty.  I then instilled the heparinized saline into the vein and clamped it.  At this point, I reset my exposure of the brachial artery and placed the artery under tension proximally and distally.  I made an arteriotomy with a #11 blade, and then I extended the arteriotomy with a Potts scissor.  I injected heparinized saline proximal and distal to this arteriotomy.  The vein was then sewn to the artery in an end-to-side configuration with a running stitch of 7-0 Prolene.  Prior to completing this anastomosis, I allowed the vein and artery to backbleed.  There was no evidence of clot from any vessels.  I completed the anastomosis in the usual fashion and then released all vessel loops and clamps.  There was a palpable  thrill  in the venous outflow, and there was a faintly palpable radial pulse with strong radial signal.  At this point, I irrigated out the surgical wound.  There was no further active bleeding.  The subcutaneous tissue was reapproximated with a running stitch of 3-0 Vicryl.  The skin was then reapproximated with a running subcuticular stitch of 4-0 Vicryl.  The skin was then cleaned, dried, and reinforced with  Dermabond.  The patient tolerated this procedure well.   COMPLICATIONS: none  CONDITION: stable   Leonides Sake, MD [facility] Office: [phone] Pager: [phone]  09/06/2014, 1:53 PM

## 2014-09-06 NOTE — Discharge Instructions (Signed)
° ° °  09/06/2014 [name] [id number] 1960/07/25  Surgeon(s): Fransisco Hertz, MD  Procedure(s): FIRST STAGE BASILIC VEIN TRANSPOSITION   x Do not stick fistula for 12 weeks

## 2014-09-06 NOTE — Anesthesia Preprocedure Evaluation (Signed)
Anesthesia Evaluation  Patient identified by MRN, date of birth, ID band Patient awake    Reviewed: Allergy & Precautions, NPO status , Patient's Chart, lab work & pertinent test results  History of Anesthesia Complications Negative for: history of anesthetic complications  Airway Mallampati: III  TM Distance: >3 FB Neck ROM: Full    Dental  (+) Teeth Intact   Pulmonary neg pulmonary ROS,  breath sounds clear to auscultation        Cardiovascular hypertension, Pt. on medications and Pt. on home beta blockers + angina + CAD, + Past MI, + Cardiac Stents and + Peripheral Vascular Disease Rhythm:Regular     Neuro/Psych  Headaches, PSYCHIATRIC DISORDERS Depression CVA, Residual Symptoms    GI/Hepatic Neg liver ROS, hiatal hernia, GERD-  ,  Endo/Other  negative endocrine ROS  Renal/GU ESRF and DialysisRenal disease     Musculoskeletal  (+) Arthritis -,   Abdominal   Peds  Hematology   Anesthesia Other Findings   Reproductive/Obstetrics                             Anesthesia Physical Anesthesia Plan  ASA: III  Anesthesia Plan: MAC   Post-op Pain Management:    Induction: Intravenous  Airway Management Planned: Nasal Cannula  Additional Equipment: None  Intra-op Plan:   Post-operative Plan:   Informed Consent: I have reviewed the patients History and Physical, chart, labs and discussed the procedure including the risks, benefits and alternatives for the proposed anesthesia with the patient or authorized representative who has indicated his/her understanding and acceptance.   Dental advisory given  Plan Discussed with: CRNA and Surgeon  Anesthesia Plan Comments:         Anesthesia Quick Evaluation

## 2014-09-06 NOTE — Progress Notes (Signed)
Transferred to Phase II. Pt has no numbness or pain in hand or left arm. Good bruit in LUE. Incision site clean and dry. No swelling noted.

## 2014-09-06 NOTE — Interval H&P Note (Signed)
History and Physical Interval Note:  09/06/2014 11:35 AM  [name]  has presented today for surgery, with the diagnosis of End Stage Renal Disease N18.6  The various methods of treatment have been discussed with the patient and family. After consideration of risks, benefits and other options for treatment, the patient has consented to  Procedure(s): FIRST STAGE BASILIC VEIN TRANSPOSITION VERSUS ARTERIOVENOUS GRAFT INSERTION (Left) as a surgical intervention .  The patient's history has been reviewed, patient examined, no change in status, stable for surgery.  I have reviewed the patient's chart and labs.  Questions were answered to the patient's satisfaction.     Leonides Sake

## 2014-09-06 NOTE — H&P (View-Only) (Signed)
   Patient name: [name] MRN: [id number] DOB: 03/20/1960 Sex: male     Chief Complaint  Patient presents with  . New Evaluation    clotted AVF  referred by Dr Lin  referral for new access    HISTORY OF PRESENT ILLNESS: The patient comes in today for dialysis access.  He has a history of a left brachiocephalic fistula which has recently clotted.  He is right-handed.  He now has a right-sided catheter in place.  The patient has a history of coronary angioplasty in 2013.  He suffers from hyperlipidemia which is managed with a statin.  Past Medical History  Diagnosis Date  . ACHALASIA   . CEREBROVASCULAR ACCIDENT, HX OF   . Headache(784.0)   . HYPERLIPIDEMIA     takes crestor daily  . RENAL CALCULUS, HX OF   . RENAL INSUFFICIENCY   . H/O hiatal hernia   . CAD (coronary artery disease), 3 vessel significant disease.    . Impaired glucose tolerance   . S/P PTCA (percutaneous transluminal coronary angioplasty), 06/30/11 07/01/2011  . CKD (chronic kidney disease) stage 4, GFR 15-29 ml/min   . History of diastolic dysfunction, grade 2 by echo   . S/P coronary artery stent placement, PTCA/DES Resolute to mid-LAD via the LIMA graft, and PTCA of apical 95% stenosis 07/05/11 07/04/2011  . DEPRESSION   . Sleep apnea     SLEEP STUDY IN [place] , NO MACHINE YET  1 YEAR  . Seizures     INSULIN INDUCED  . ANEMIA-NOS   . NSTEMI (non-ST elevated myocardial infarction), 06/30/11 07/01/2011  . Stroke     93/2005/2006/2007;left sided weakness  . DEGENERATIVE JOINT DISEASE, SHOULDER     knee  . Gout     takes Uloric daily  . GERD     no meds required  . History of colon polyps   . HYPERTENSION     takes Norvasc,Catapress,Hydralazine,Imdur,and Metoprolol daily  . Insomnia     takes Ambien nightly  . Hyperlipidemia     Past Surgical History  Procedure Laterality Date  . Tonsillectomy    . Stomach surgery  2009    achalasia  . Knee surgery  01/07/2011    right  . Insertion of  dialysis catheter  04/01/2011    Procedure: INSERTION OF DIALYSIS CATHETER;  Surgeon: Brian Liang-Yu Chen, MD;  Location: [facility] OR;  Service: Vascular;  Laterality: N/A;  . Cardiac catheterization    . Av fistula placement  12/02/2011    Procedure: ARTERIOVENOUS (AV) FISTULA CREATION;  Surgeon: Brian L Chen, MD;  Location: [facility] OR;  Service: Vascular;  Laterality: Left;  Ultrasound Guided  . Subclavian stent placement  12/20/2011  . Coronary artery bypass graft  04/01/2011    Procedure: CORONARY ARTERY BYPASS GRAFTING (CABG);  Surgeon: Peter Van Trigt III, MD;  Location: [facility] OR;  Service: Open Heart Surgery;  Laterality: N/A;  . Coronary angioplasty      06/30/11   AT [facility]  . Colonoscopy    . Hyperlipidemia    . Ligation of arteriovenous  fistula Left 06/08/2012    Procedure: LIGATION OF ARTERIOVENOUS  FISTULA;  Surgeon: Brian L Chen, MD;  Location: [facility] OR;  Service: Vascular;  Laterality: Left;  . Av fistula placement Left 06/08/2012    Procedure: ARTERIOVENOUS (AV) FISTULA CREATION;  Surgeon: Brian L Chen, MD;  Location: [facility] OR;  Service: Vascular;  Laterality: Left;  . Left heart catheterization with coronary angiogram N/A 03/28/2011      Procedure: LEFT HEART CATHETERIZATION WITH CORONARY ANGIOGRAM;  Surgeon: Thomas A Kelly, MD;  Location: [facility] CATH [facility];  Service: Cardiovascular;  Laterality: N/A;  pending creatnine  . Left heart catheterization with coronary/graft angiogram N/A 06/30/2011    Procedure: LEFT HEART CATHETERIZATION WITH CORONARY/GRAFT ANGIOGRAM;  Surgeon: Mihai Croitoru, MD;  Location: [facility] CATH [facility];  Service: Cardiovascular;  Laterality: N/A;  . Percutaneous coronary intervention-balloon only  06/30/2011    Procedure: PERCUTANEOUS CORONARY INTERVENTION-BALLOON ONLY;  Surgeon: Mihai Croitoru, MD;  Location: [facility]ATH [facility];  Service: Cardiovascular;;  . Percutaneous coronary stent intervention (pci-s) N/A 07/04/2011    Procedure: PERCUTANEOUS CORONARY STENT INTERVENTION (PCI-S);  Surgeon: Thomas A Kelly,  MD;  Location: [facility];  Service: Cardiovascular;  Laterality: N/A;  . Arch aortogram N/A 02/19/2012    Procedure: ARCH AORTOGRAM;  Surgeon: Brian L Chen, MD;  Location: [facility];  Service: Cardiovascular;  Laterality: N/A;    History   Social History  . Marital Status: Divorced    Spouse Name: N/A  . Number of Children: N/A  . Years of Education: N/A   Occupational History  . Not on file.   Social History Main Topics  . Smoking status: Never Smoker   . Smokeless tobacco: Former User    Types: Chew    Quit date: 03/18/2003  . Alcohol Use: No     Comment: rarely  . Drug Use: No  . Sexual Activity: Not Currently   Other Topics Concern  . Not on file   Social History Narrative    Family History  Problem Relation Age of Onset  . Hypertension Other   . Stroke Other   . Hyperlipidemia Other   . Cancer Mother   . Heart disease Mother   . Hyperlipidemia Mother   . Hypertension Mother   . Cancer Father   . Heart disease Father     Allergies as of 09/01/2014 - Review Complete 09/01/2014  Allergen Reaction Noted  . Shrimp [shellfish allergy] Shortness Of Breath 03/27/2011  . Insulins Other (See Comments) 04/01/2011  . Atorvastatin Other (See Comments) 05/22/2008  . Simvastatin Other (See Comments) 05/22/2008  . Ultram [tramadol] Other (See Comments) 09/10/2011    Current Outpatient Prescriptions on File Prior to Visit  Medication Sig Dispense Refill  . acetaminophen (TYLENOL) 500 MG tablet Take 1,000 mg by mouth every 6 (six) hours as needed (pain).    . aspirin EC 81 MG EC tablet Take 1 tablet (81 mg total) by mouth daily.    . cetirizine (ZYRTEC) 10 MG tablet Take 10 mg by mouth at bedtime.    . ciclopirox (PENLAC) 8 % solution Apply topically at bedtime. Apply over nail and surrounding skin. Apply daily over previous coat. After seven (7) days, may remove with alcohol and continue cycle. 6.6 mL 2  . cloNIDine (CATAPRES) 0.2 MG tablet Take 0.2 mg by mouth 2  (two) times daily.     . clopidogrel (PLAVIX) 75 MG tablet Take 1 tablet (75 mg total) by mouth at bedtime. 90 tablet 3  . febuxostat (ULORIC) 40 MG tablet Take 40 mg by mouth at bedtime.     . GREEN TEA, CAMILLIA SINENSIS, PO Take 2 capsules by mouth daily.    . hydrALAZINE (APRESOLINE) 10 MG tablet Take 10 mg by mouth 2 (two) times daily.    . isosorbide mononitrate (IMDUR) 30 MG 24 hr tablet Take 1 tablet (30 mg total) by mouth daily. 30 tablet 6  . KRILL OIL PO   Take 3 capsules by mouth daily.    . metoprolol (LOPRESSOR) 50 MG tablet TAKE 1 TABLET BY MOUTH TWICE A DAY 180 tablet 3  . multivitamin (RENA-VIT) TABS tablet Take 1 tablet by mouth daily.    . NITROSTAT 0.4 MG SL tablet PLACE 1 TABLET UNDER TONGUE AS NEEDED FOR CHEST PAIN EVERY 5 MINUTES (MAX 3DOSES) 25 tablet 4  . OVER THE COUNTER MEDICATION Take 1 tablet by mouth at bedtime. HIGH POTASSIUM LEVEL MEDICATION    . OVER THE COUNTER MEDICATION Take 1 tablet by mouth at bedtime. PROSTATE SUPPLEMENT    . polysaccharide iron (NIFEREX) 150 MG CAPS capsule Take 150 mg by mouth at bedtime.     . rosuvastatin (CRESTOR) 20 MG tablet Take 1 tablet (20 mg total) by mouth every evening. 90 tablet 3  . terazosin (HYTRIN) 10 MG capsule TAKE 1 CAPSULE BY MOUTH AT BEDTIME. 90 capsule 2  . ticagrelor (BRILINTA) 90 MG TABS tablet Take 90 mg by mouth daily.    . zolpidem (AMBIEN) 10 MG tablet Take 1 tablet (10 mg total) by mouth at bedtime as needed for sleep. 30 tablet 5  . levofloxacin (LEVAQUIN) 250 MG tablet Take 1 tablet (250 mg total) by mouth every other day. (Patient not taking: Reported on 09/01/2014) 4 tablet 0  . oxyCODONE (ROXICODONE) 5 MG immediate release tablet Take 1 tablet (5 mg total) by mouth every 4 (four) hours as needed for pain. (Patient not taking: Reported on 09/01/2014) 30 tablet 0  . ranolazine (RANEXA) 500 MG 12 hr tablet Take 1 tablet (500 mg total) by mouth every morning. 90 tablet 1  . terazosin (HYTRIN) 5 MG capsule Take 1  capsule (5 mg total) by mouth at bedtime. (Patient not taking: Reported on 09/01/2014) 30 capsule 1   No current facility-administered medications on file prior to visit.     REVIEW OF SYSTEMS: Cardiovascular: No chest pain, chest pressure, palpitations, orthopnea, or dyspnea on exertion. No claudication or rest pain,  No history of DVT or phlebitis. Pulmonary: No productive cough, asthma or wheezing. Neurologic: No weakness, paresthesias, aphasia, or amaurosis. No dizziness. Hematologic: No bleeding problems or clotting disorders. Musculoskeletal: No joint pain or joint swelling. Gastrointestinal: No blood in stool or hematemesis Genitourinary: No dysuria or hematuria. Psychiatric:: No history of major depression. Integumentary: No rashes or ulcers. Constitutional: No fever or chills.  PHYSICAL EXAMINATION:   Vital signs are  Filed Vitals:   09/01/14 1037 09/01/14 1046  BP: 187/124 190/113  Pulse: 138 132  Resp: 16   Height: 6' (1.829 m)   Weight: 225 lb (102.059 kg)    Body mass index is 30.51 kg/(m^2). General: The patient appears their stated age. HEENT:  No gross abnormalities Pulmonary:  Non labored breathing Musculoskeletal: There are no major deformities. Neurologic: No focal weakness or paresthesias are detected, Skin: There are no ulcer or rashes noted. Psychiatric: The patient has normal affect. Cardiovascular: Occluded left brachiocephalic fistula   Diagnostic Studies I have reviewed his vein mapping.  He has marginal basilic veins bilaterally.  Assessment: End stage renal disease Plan: I discussed proceeding with left arm access.  I said we would plan to do a first stage left basilic vein transposition if the basilic vein seems reasonable in the operating room.  He understands that this would be part 1 of a two-stage procedure.  If his veins are completely not usable, he would get a Gore-Tex graft.  This is been scheduled for this coming Monday.    He tells me  he only takes an aspirin but his medication shows other antiplatelets therapy which I will make sure he discontinues.  V. Wells Brabham IV, M.D. [facility] Office: [phone] Pager:  [phone]   

## 2014-09-06 NOTE — Transfer of Care (Signed)
Immediate Anesthesia Transfer of Care Note  Patient: [name]  Procedure(s) Performed: Procedure(s): FIRST STAGE BASILIC VEIN TRANSPOSITION  (Left)  Patient Location: PACU  Anesthesia Type:MAC  Level of Consciousness: awake, alert , oriented and patient cooperative  Airway & Oxygen Therapy: Patient Spontanous Breathing  Post-op Assessment: Report given to RN, Post -op Vital signs reviewed and stable and Patient moving all extremities  Post vital signs: Reviewed and stable  Last Vitals:  Filed Vitals:   09/06/14 1126  BP: 156/110  Pulse: 100  Temp: 37 C  Resp: 16    Complications: No apparent anesthesia complications

## 2014-09-07 ENCOUNTER — Encounter (HOSPITAL_COMMUNITY): Payer: Self-pay | Admitting: Vascular Surgery

## 2014-09-07 DIAGNOSIS — N2581 Secondary hyperparathyroidism of renal origin: Secondary | ICD-10-CM | POA: Diagnosis not present

## 2014-09-07 DIAGNOSIS — D631 Anemia in chronic kidney disease: Secondary | ICD-10-CM | POA: Diagnosis not present

## 2014-09-07 DIAGNOSIS — N186 End stage renal disease: Secondary | ICD-10-CM | POA: Diagnosis not present

## 2014-09-07 DIAGNOSIS — D509 Iron deficiency anemia, unspecified: Secondary | ICD-10-CM | POA: Diagnosis not present

## 2014-09-09 DIAGNOSIS — D631 Anemia in chronic kidney disease: Secondary | ICD-10-CM | POA: Diagnosis not present

## 2014-09-09 DIAGNOSIS — N2581 Secondary hyperparathyroidism of renal origin: Secondary | ICD-10-CM | POA: Diagnosis not present

## 2014-09-09 DIAGNOSIS — N186 End stage renal disease: Secondary | ICD-10-CM | POA: Diagnosis not present

## 2014-09-09 DIAGNOSIS — D509 Iron deficiency anemia, unspecified: Secondary | ICD-10-CM | POA: Diagnosis not present

## 2014-09-09 NOTE — Anesthesia Postprocedure Evaluation (Signed)
  Anesthesia Post-op Note  Patient: [name]  Procedure(s) Performed: Procedure(s): FIRST STAGE BASILIC VEIN TRANSPOSITION  (Left)  Patient Location: PACU  Anesthesia Type:MAC  Level of Consciousness: awake  Airway and Oxygen Therapy: Patient Spontanous Breathing  Post-op Pain: mild  Post-op Assessment: Post-op Vital signs reviewed, Patient's Cardiovascular Status Stable, Respiratory Function Stable, Patent Airway, No signs of Nausea or vomiting and Pain level controlled              Post-op Vital Signs: Reviewed and stable  Last Vitals:  Filed Vitals:   09/06/14 1509  BP: 152/95  Pulse: 85  Temp:   Resp:     Complications: No apparent anesthesia complications

## 2014-09-11 ENCOUNTER — Telehealth: Payer: Self-pay | Admitting: Vascular Surgery

## 2014-09-11 NOTE — Telephone Encounter (Addendum)
-----   Message from Sharee Pimple, RN sent at 09/06/2014  2:52 PM EDT ----- Regarding: Schedule   ----- Message -----    From: Dara Lords, PA-C    Sent: 09/06/2014   2:08 PM      To: [facility] Charge Pool  S/p left Brachial vein transposition 1st stage 09/06/14.  F/u with Dr. Imogene Burn in 4 weeks.  Thanks, Samantha  09/11/14: lm for pt re appt, dpm

## 2014-09-12 ENCOUNTER — Telehealth: Payer: Self-pay

## 2014-09-12 DIAGNOSIS — D631 Anemia in chronic kidney disease: Secondary | ICD-10-CM | POA: Diagnosis not present

## 2014-09-12 DIAGNOSIS — D509 Iron deficiency anemia, unspecified: Secondary | ICD-10-CM | POA: Diagnosis not present

## 2014-09-12 DIAGNOSIS — N186 End stage renal disease: Secondary | ICD-10-CM | POA: Diagnosis not present

## 2014-09-12 DIAGNOSIS — N2581 Secondary hyperparathyroidism of renal origin: Secondary | ICD-10-CM | POA: Diagnosis not present

## 2014-09-12 NOTE — Telephone Encounter (Signed)
Call to Mr. Ezzard Standing, agreed to schedule AWV prior to apt with Dr. Jonny Ruiz at 3pm. Will come in at 2:13

## 2014-09-13 ENCOUNTER — Telehealth: Payer: Self-pay | Admitting: Cardiovascular Disease

## 2014-09-13 ENCOUNTER — Encounter: Payer: Self-pay | Admitting: Internal Medicine

## 2014-09-13 ENCOUNTER — Ambulatory Visit (INDEPENDENT_AMBULATORY_CARE_PROVIDER_SITE_OTHER): Payer: Medicare Other | Admitting: Internal Medicine

## 2014-09-13 VITALS — BP 122/86 | HR 71 | Temp 98.4°F | Ht 72.0 in | Wt 221.8 lb

## 2014-09-13 DIAGNOSIS — R0989 Other specified symptoms and signs involving the circulatory and respiratory systems: Secondary | ICD-10-CM | POA: Insufficient documentation

## 2014-09-13 DIAGNOSIS — D649 Anemia, unspecified: Secondary | ICD-10-CM

## 2014-09-13 DIAGNOSIS — Z0000 Encounter for general adult medical examination without abnormal findings: Secondary | ICD-10-CM | POA: Diagnosis not present

## 2014-09-13 DIAGNOSIS — I10 Essential (primary) hypertension: Secondary | ICD-10-CM

## 2014-09-13 MED ORDER — TICAGRELOR 90 MG PO TABS: 90.0000 mg | ORAL_TABLET | Freq: Every day | ORAL | Status: DC

## 2014-09-13 MED ORDER — CLOPIDOGREL BISULFATE 75 MG PO TABS: 75.0000 mg | ORAL_TABLET | Freq: Every day | ORAL | Status: DC

## 2014-09-13 MED ORDER — METOPROLOL TARTRATE 50 MG PO TABS: 50.0000 mg | ORAL_TABLET | Freq: Two times a day (BID) | ORAL | Status: DC

## 2014-09-13 MED ORDER — RANOLAZINE ER 500 MG PO TB12: 500.0000 mg | ORAL_TABLET | Freq: Every day | ORAL | Status: DC

## 2014-09-13 MED ORDER — CLONIDINE HCL 0.2 MG PO TABS: 0.2000 mg | ORAL_TABLET | Freq: Two times a day (BID) | ORAL | Status: DC

## 2014-09-13 MED ORDER — ZOLPIDEM TARTRATE 10 MG PO TABS: 10.0000 mg | ORAL_TABLET | Freq: Every evening | ORAL | Status: DC | PRN

## 2014-09-13 MED ORDER — ROSUVASTATIN CALCIUM 20 MG PO TABS: 20.0000 mg | ORAL_TABLET | Freq: Every evening | ORAL | Status: DC

## 2014-09-13 NOTE — Assessment & Plan Note (Signed)
stable overall by history and exam, recent data reviewed with pt, and pt to continue medical treatment as before,  to f/u any worsening symptoms or concerns .,c/w anemia of renal dz,  Lab Results  Component Value Date   WBC 6.4 06/08/2014   HGB 12.2* 09/06/2014   HCT 36.0* 09/06/2014   MCV 95.6 06/08/2014   PLT 271 06/08/2014    to f/u any worsening symptoms or concerns

## 2014-09-13 NOTE — Telephone Encounter (Signed)
Pt would like samples of Brilinta please. °

## 2014-09-13 NOTE — Progress Notes (Signed)
Subjective:   [name] is a 54 y.o. male who presents for Medicare Annual/Subsequent preventive examination.  Review of Systems:   HRA assessment completed during visit;  Problem list review for risk  Risk 2005 stroke; paralyzed left side;  Took one year to recover; 2006 had stroke on right;  2007; Oct 15th of last year; 4th event;  Residual: good; except c/o of left leg buckles after dialysis; HTN; somedays good; sometimes not; been high this week; 4 to 5 lbs; Weight; 220;  Risk for Repeat MI or stroke secondary to HTN; CAD; CRF; IGT; Hyperlipidemia (PTCA in April 2013)  Bleeding risk secondary to Plavix; CVA w left sided weakness 10/15 following dialysis Any heart issues; no issues at present   Goals: Major concern; Getting back on the transplant list;  Medication barriers; will get fungal cream on the 13th. List of meds he cannot get until 7/13 due to lending money to friend to have car fixed; this is not the norm; Expressed concern regarding anti-clotting medication educated on why it is important to take it.  Will call Dr. Hazle Coca office for samples of Plavix (off 2 days and Brilanta off x 1 day)   Lipids: cho 157; Tri 107; HDL 47; LDL 89  A1c 5.6 10/16 BMI: 6/16 78ft; 225; BMI 30.6 Diet; Breakfast; bacon and eggs; Lunch; corn, red beans; Supper; corn Usually have soup; potatoes;  Exercise; not to lift any weights; try to walk in the afternoon x 2 days   Family Hx; father and both grandfathers hx of heart disease; hx of stroke Social history: Has 54 yo Lives alone; Call sister in [place];    Psychosocial support; safe community; firearm safety; smoke alarms;  Caregiver to other?   Discussed Goal to improve health based on risk        Objective:    Vitals: BP 122/86 mmHg  Pulse 71  Temp(Src) 98.4 F (36.9 C) (Oral)  Ht 6' (1.829 m)  Wt 221 lb 12 oz (100.585 kg)  BMI 30.07 kg/m2  SpO2 98%  Tobacco History  Smoking status  . Never Smoker     Smokeless tobacco  . Former Neurosurgeon  . Types: Chew  . Quit date: 03/18/2003    Comment: chewed tobacco      Counseling given: Not Answered   Past Medical History  Diagnosis Date  . ACHALASIA   . CEREBROVASCULAR ACCIDENT, HX OF   . Headache(784.0)   . HYPERLIPIDEMIA     takes crestor daily  . RENAL CALCULUS, HX OF   . H/O hiatal hernia   . CAD (coronary artery disease), 3 vessel significant disease.    . Impaired glucose tolerance   . S/P PTCA (percutaneous transluminal coronary angioplasty), 06/30/11 07/01/2011  . History of diastolic dysfunction, grade 2 by echo   . S/P coronary artery stent placement, PTCA/DES Resolute to mid-LAD via the LIMA graft, and PTCA of apical 95% stenosis 07/05/11 07/04/2011  . Seizures     INSULIN INDUCED  . ANEMIA-NOS   . NSTEMI (non-ST elevated myocardial infarction), 06/30/11 07/01/2011  . DEGENERATIVE JOINT DISEASE, SHOULDER     knee  . Gout     takes Uloric daily  . GERD     no meds required  . History of colon polyps   . HYPERTENSION     takes Norvasc,Catapress,Hydralazine,Imdur,and Metoprolol daily  . Insomnia     takes Ambien nightly  . Hyperlipidemia   . Family history of adverse reaction to anesthesia  Mother, hard to awaken  . Stroke     93/2005/2006/2007;left sided weakness.  1993 12/2013  . Sleep apnea     SLEEP STUDY IN [place] , NO MACHINE YET  1 YEAR  . DEPRESSION     2006- not depressed any longer  . RENAL INSUFFICIENCY     12/2013- TThSat  . CKD (chronic kidney disease) stage 4, GFR 15-29 ml/min   . Constipation    Past Surgical History  Procedure Laterality Date  . Tonsillectomy    . Stomach surgery  2009    achalasia  . Knee surgery Right 01/07/2011    arthroscopy  . Insertion of dialysis catheter  04/01/2011    Procedure: INSERTION OF DIALYSIS CATHETER;  Surgeon: Nilda Simmer, MD;  Location: [facility] OR;  Service: Vascular;  Laterality: N/A;  . Cardiac catheterization    . Av fistula placement  12/02/2011     Procedure: ARTERIOVENOUS (AV) FISTULA CREATION;  Surgeon: Fransisco Hertz, MD;  Location: [facility] OR;  Service: Vascular;  Laterality: Left;  Ultrasound Guided  . Subclavian stent placement  12/20/2011  . Coronary artery bypass graft  04/01/2011    Procedure: CORONARY ARTERY BYPASS GRAFTING (CABG);  Surgeon: Kathlee Nations Suann Larry, MD;  Location: [facility] OR;  Service: Open Heart Surgery;  Laterality: N/A;  . Coronary angioplasty      06/30/11   AT [facility]  . Colonoscopy    . Ligation of arteriovenous  fistula Left 06/08/2012    Procedure: LIGATION OF ARTERIOVENOUS  FISTULA;  Surgeon: Fransisco Hertz, MD;  Location: [facility] OR;  Service: Vascular;  Laterality: Left;  . Av fistula placement Left 06/08/2012    Procedure: ARTERIOVENOUS (AV) FISTULA CREATION;  Surgeon: Fransisco Hertz, MD;  Location: [facility] OR;  Service: Vascular;  Laterality: Left;  . Left heart catheterization with coronary angiogram N/A 03/28/2011    Procedure: LEFT HEART CATHETERIZATION WITH CORONARY ANGIOGRAM;  Surgeon: Lennette Bihari, MD;  Location: [facility] CATH LAB;  Service: Cardiovascular;  Laterality: N/A;  pending creatnine  . Left heart catheterization with coronary/graft angiogram N/A 06/30/2011    Procedure: LEFT HEART CATHETERIZATION WITH Isabel Caprice;  Surgeon: Thurmon Fair, MD;  Location: [facility] CATH LAB;  Service: Cardiovascular;  Laterality: N/A;  . Percutaneous coronary intervention-balloon only  06/30/2011    Procedure: PERCUTANEOUS CORONARY INTERVENTION-BALLOON ONLY;  Surgeon: Thurmon Fair, MD;  Location: [facility] CATH LAB;  Service: Cardiovascular;;  . Percutaneous coronary stent intervention (pci-s) N/A 07/04/2011    Procedure: PERCUTANEOUS CORONARY STENT INTERVENTION (PCI-S);  Surgeon: Lennette Bihari, MD;  Location: [facility] CATH LAB;  Service: Cardiovascular;  Laterality: N/A;  . Arch aortogram N/A 02/19/2012    Procedure: ARCH AORTOGRAM;  Surgeon: Fransisco Hertz, MD;  Location: [facility] CATH LAB;  Service: Cardiovascular;  Laterality: N/A;  . Hernia repair  2013    . Bascilic vein transposition Left 09/06/2014    Procedure: FIRST STAGE BASILIC VEIN TRANSPOSITION ;  Surgeon: Fransisco Hertz, MD;  Location: [facility] OR;  Service: Vascular;  Laterality: Left;   Family History  Problem Relation Age of Onset  . Hypertension Other   . Stroke Other   . Hyperlipidemia Other   . Cancer Mother   . Heart disease Mother   . Hyperlipidemia Mother   . Hypertension Mother   . Cancer Father   . Heart disease Father    History  Sexual Activity  . Sexual Activity: Not Currently    Outpatient Encounter Prescriptions as of 09/13/2014  Medication Sig  .  acetaminophen (TYLENOL) 500 MG tablet Take 1,000 mg by mouth every 6 (six) hours as needed (pain).  Marland Kitchen aspirin EC 81 MG EC tablet Take 1 tablet (81 mg total) by mouth daily.  . cetirizine (ZYRTEC) 10 MG tablet Take 10 mg by mouth daily as needed for allergies.   . cinacalcet (SENSIPAR) 30 MG tablet Take 30 mg by mouth at bedtime.  . febuxostat (ULORIC) 40 MG tablet Take 40 mg by mouth daily.   Marland Kitchen GREEN TEA, CAMILLIA SINENSIS, PO Take 1 capsule by mouth daily.   . hydrALAZINE (APRESOLINE) 10 MG tablet Take 10 mg by mouth 2 (two) times daily.  . isosorbide mononitrate (IMDUR) 30 MG 24 hr tablet Take 1 tablet (30 mg total) by mouth daily.  Marland Kitchen lanthanum ([name]) 1000 MG chewable tablet Chew 2,000 mg by mouth 3 (three) times daily with meals.  . multivitamin (RENA-VIT) TABS tablet Take 1 tablet by mouth at bedtime.   Marland Kitchen NITROSTAT 0.4 MG SL tablet PLACE 1 TABLET UNDER TONGUE AS NEEDED FOR CHEST PAIN EVERY 5 MINUTES (MAX 3DOSES)  . oxyCODONE (ROXICODONE) 5 MG immediate release tablet Take 1 tablet (5 mg total) by mouth every 4 (four) hours as needed.  . terazosin (HYTRIN) 10 MG capsule TAKE 1 CAPSULE BY MOUTH AT BEDTIME.  Marland Kitchen zolpidem (AMBIEN) 10 MG tablet Take 1 tablet (10 mg total) by mouth at bedtime as needed for sleep.  . ciclopirox (PENLAC) 8 % solution Apply topically at bedtime. Apply over nail and surrounding skin. Apply  daily over previous coat. After seven (7) days, may remove with alcohol and continue cycle. (Patient not taking: Reported on 09/06/2014)  . cloNIDine (CATAPRES) 0.2 MG tablet Take 0.2 mg by mouth 2 (two) times daily.   . clopidogrel (PLAVIX) 75 MG tablet Take 1 tablet (75 mg total) by mouth at bedtime. (Patient not taking: Reported on 09/13/2014)  . KRILL OIL PO Take 1 capsule by mouth 2 (two) times daily.   . metoprolol (LOPRESSOR) 50 MG tablet TAKE 1 TABLET BY MOUTH TWICE A DAY (Patient not taking: Reported on 09/13/2014)  . ranolazine (RANEXA) 500 MG 12 hr tablet Take 500 mg by mouth daily.  . rosuvastatin (CRESTOR) 20 MG tablet Take 1 tablet (20 mg total) by mouth every evening. (Patient not taking: Reported on 09/13/2014)  . ticagrelor (BRILINTA) 90 MG TABS tablet Take 90 mg by mouth daily.  . [DISCONTINUED] polysaccharide iron (NIFEREX) 150 MG CAPS capsule Take 150 mg by mouth daily.    No facility-administered encounter medications on file as of 09/13/2014.    Activities of Daily Living In your present state of health, do you have any difficulty performing the following activities: 09/13/2014 09/06/2014  Hearing? N N  Vision? N N  Difficulty concentrating or making decisions? N N  Walking or climbing stairs? N N  Dressing or bathing? N Y  Doing errands, shopping? N -    Patient Care Team: Corwin Levins, MD as PCP - General Elvis Coil, MD (Nephrology) Runell Gess, MD as Attending Physician (Cardiology)   Assessment:    Objective:   The goal of the wellness visit is to assist the patient how to close the gaps in care and create a preventative care plan for the patient.  Personalized Education was given regarding:   Pt determined a personalized goal; see patient goals;  Assessment included: smoking (secondary) educated as appropriate; including LDCT if as ppropriate Bone density scan as appropriate Calcium and Vit D as appropriate/ Osteoporosis  risk reviewed Taking meds  without issues; no barriers identified Labs were and fup visit noted with MD if labs are due to be re-drawn. Stress: Recommendations for managing stress if assessed as a factor;  No Risk for hepatitis or high risk social behavior identified via hepatitis screen Educated on shingles and follow up with insurance company for co-pays or charges applied to Part D benefit. Educated on Vaccines;  Safety issues reviewed; Cognition assessed by AD8; Score 0 MMSE deferred as the patient stated they had no memory issues; No identified risk were noted; The patient was oriented x 3; appropriate in dress and manner and no objective failures at ADL's or IADL's.  Bowel and bladder issues assessed   Screenings overdue Ophthalmology exam; Use readers; eye doctor off [address]; has one every year Immunizations; up to date Shingles: not due Colonoscopy; 09/2005 polyps removed; will repeat in one year EKG 06/09/2014  Hearing: 4000hz  both ears Dental: Get cleaning soon; Normally gets these one time a year   Exercise Activities and Dietary recommendations    Goals    . get a kidney     Will discuss with doctor;      . medication adherence     Will fup on mail order program to see if his meds will be more economical for his budget  Will try to stay on anti-clotting medication      Fall Risk Fall Risk  09/13/2014 01/18/2014 11/09/2013  Falls in the past year? Yes Yes No  Number falls in past yr: 2 or more 2 or more -  Injury with Fall? No - -  Risk Factor Category  - High Fall Risk -  Risk for fall due to : Other (Comment) - -  Risk for fall due to (comments): states legs give way; restricted to 40oz of water per day - -  Follow up Education provided - -   Depression Screen PHQ 2/9 Scores 09/13/2014 01/18/2014 11/09/2013 05/05/2011  PHQ - 2 Score 0 0 0 0    Cognitive Testing MMSE - Mini Mental State Exam 09/13/2014  Not completed: Unable to complete    Immunization History  Administered Date(s)  Administered  . Influenza,inj,Quad PF,36+ Mos 11/09/2013  . Td 05/22/2008   Screening Tests Health Maintenance  Topic Date Due  . INFLUENZA VACCINE  10/16/2014  . COLONOSCOPY  10/04/2015  . TETANUS/TDAP  05/23/2018  . HIV Screening  Completed      Plan:     Plan: 1. Will check with SW at dialysis for assistance if needed for medications 2. I will check with Dr. Allyson Sabal regarding Plavix and Brilinta 3. Will fup on information rec'd regarding rx by mail order from the insurance carrier 4. Will have vision check   LT plan is to receive a kidney and go back on the transplant.   During the course of the visit the patient was educated and counseled about the following appropriate screening and preventive services:   Vaccines to include Pneumoccal, Influenza, Hepatitis B, Td, Zostavax, HCV/ up to date    Electrocardiogram deferred to cardiology  Cardiovascular Disease/ reviewed   Colorectal cancer screening; not due  Diabetes screening/ neg  Prostate Cancer Screening;deferred  Glaucoma screening; to be done  Nutrition counseling   Smoking cessation counseling  Patient Instructions (the written plan) was given to the patient.    Montine Circle, RN  09/13/2014   Medical screening examination/treatment/procedure(s) were performed by non-physician practitioner and as supervising physician I was immediately available for consultation/collaboration.  I agree with above. Oliver Barre, MD

## 2014-09-13 NOTE — Assessment & Plan Note (Signed)
ECG reviewed as per emr - NSR with PAC's, asympt, cont BB,  to f/u any worsening symptoms or concerns

## 2014-09-13 NOTE — Progress Notes (Signed)
Pre visit review using our clinic review tool, if applicable. No additional management support is needed unless otherwise documented below in the visit note. 

## 2014-09-13 NOTE — Patient Instructions (Addendum)
You EKG was OK today  Please continue all other medications as before, and refills have been done if requested - one month of the medications you stated you were out, enough to get back to see Dr Allyson Sabal July 8  Please have the pharmacy call with any other refills you may need.  Please continue your efforts at being more active, low cholesterol diet, and weight control.  You are otherwise up to date with prevention measures today.  Please keep your appointments with your specialists as you may have planned  We can hold on further blood work today  Your DOT form was filled out today  Please return in 6 months, or sooner if needed,[name] , Thank you for taking time to come for your Medicare Wellness Visit. I appreciate your ongoing commitment to your health goals. Please review the following plan we discussed and let me know if I can assist you in the future.   Plan: 1. Will check with SW at dialysis for assistance if needed for medications 2. I will check with Dr. Allyson Sabal regarding Plavix and Brilinta 3. Will fup on information rec'd regarding rx by mail order from the insurance carrier 4. Will have vision check   LT plan is to receive a kidney and go back on the transplant.   These are the goals we discussed: Goals    . get a kidney     Will discuss with doctor;      . medication adherence     Will fup on mail order program to see if his meds will be more economical for his budget  Will try to stay on anti-clotting medication       This is a list of the screening recommended for you and due dates:  Health Maintenance  Topic Date Due  . Flu Shot  10/16/2014  . Colon Cancer Screening  10/04/2015  . Tetanus Vaccine  05/23/2018  . HIV Screening  Completed      Health Maintenance A healthy lifestyle and preventative care can promote health and wellness.  Maintain regular health, dental, and eye exams.  Eat a healthy diet. Foods like vegetables, fruits, whole grains,  low-fat dairy products, and lean protein foods contain the nutrients you need and are low in calories. Decrease your intake of foods high in solid fats, added sugars, and salt. Get information about a proper diet from your health care provider, if necessary.  Regular physical exercise is one of the most important things you can do for your health. Most adults should get at least 150 minutes of moderate-intensity exercise (any activity that increases your heart rate and causes you to sweat) each week. In addition, most adults need muscle-strengthening exercises on 2 or more days a week.   Maintain a healthy weight. The body mass index (BMI) is a screening tool to identify possible weight problems. It provides an estimate of body fat based on height and weight. Your health care provider can find your BMI and can help you achieve or maintain a healthy weight. For males 20 years and older:  A BMI below 18.5 is considered underweight.  A BMI of 18.5 to 24.9 is normal.  A BMI of 25 to 29.9 is considered overweight.  A BMI of 30 and above is considered obese.  Maintain normal blood lipids and cholesterol by exercising and minimizing your intake of saturated fat. Eat a balanced diet with plenty of fruits and vegetables. Blood tests for lipids and cholesterol should  begin at age 77 and be repeated every 5 years. If your lipid or cholesterol levels are high, you are over age 63, or you are at high risk for heart disease, you may need your cholesterol levels checked more frequently.Ongoing high lipid and cholesterol levels should be treated with medicines if diet and exercise are not working.  If you smoke, find out from your health care provider how to quit. If you do not use tobacco, do not start.  Lung cancer screening is recommended for adults aged 55-80 years who are at high risk for developing lung cancer because of a history of smoking. A yearly low-dose CT scan of the lungs is recommended for people  who have at least a 30-pack-year history of smoking and are current smokers or have quit within the past 15 years. A pack year of smoking is smoking an average of 1 pack of cigarettes a day for 1 year (for example, a 30-pack-year history of smoking could mean smoking 1 pack a day for 30 years or 2 packs a day for 15 years). Yearly screening should continue until the smoker has stopped smoking for at least 15 years. Yearly screening should be stopped for people who develop a health problem that would prevent them from having lung cancer treatment.  If you choose to drink alcohol, do not have more than 2 drinks per day. One drink is considered to be 12 oz (360 mL) of beer, 5 oz (150 mL) of wine, or 1.5 oz (45 mL) of liquor.  Avoid the use of street drugs. Do not share needles with anyone. Ask for help if you need support or instructions about stopping the use of drugs.  High blood pressure causes heart disease and increases the risk of stroke. Blood pressure should be checked at least every 1-2 years. Ongoing high blood pressure should be treated with medicines if weight loss and exercise are not effective.  If you are 51-5 years old, ask your health care provider if you should take aspirin to prevent heart disease.  Diabetes screening involves taking a blood sample to check your fasting blood sugar level. This should be done once every 3 years after age 32 if you are at a normal weight and without risk factors for diabetes. Testing should be considered at a younger age or be carried out more frequently if you are overweight and have at least 1 risk factor for diabetes.  Colorectal cancer can be detected and often prevented. Most routine colorectal cancer screening begins at the age of 60 and continues through age 16. However, your health care provider may recommend screening at an earlier age if you have risk factors for colon cancer. On a yearly basis, your health care provider may provide home test  kits to check for hidden blood in the stool. A small camera at the end of a tube may be used to directly examine the colon (sigmoidoscopy or colonoscopy) to detect the earliest forms of colorectal cancer. Talk to your health care provider about this at age 34 when routine screening begins. A direct exam of the colon should be repeated every 5-10 years through age 68, unless early forms of precancerous polyps or small growths are found.  People who are at an increased risk for hepatitis B should be screened for this virus. You are considered at high risk for hepatitis B if:  You were born in a country where hepatitis B occurs often. Talk with your health care provider about which  countries are considered high risk.  Your parents were born in a high-risk country and you have not received a shot to protect against hepatitis B (hepatitis B vaccine).  You have HIV or AIDS.  You use needles to inject street drugs.  You live with, or have sex with, someone who has hepatitis B.  You are a man who has sex with other men (MSM).  You get hemodialysis treatment.  You take certain medicines for conditions like cancer, organ transplantation, and autoimmune conditions.  Hepatitis C blood testing is recommended for all people born from [birth year] through 1965 and any individual with known risk factors for hepatitis C.  Healthy men should no longer receive prostate-specific antigen (PSA) blood tests as part of routine cancer screening. Talk to your health care provider about prostate cancer screening.  Testicular cancer screening is not recommended for adolescents or adult males who have no symptoms. Screening includes self-exam, a health care provider exam, and other screening tests. Consult with your health care provider about any symptoms you have or any concerns you have about testicular cancer.  Practice safe sex. Use condoms and avoid high-risk sexual practices to reduce the spread of sexually transmitted  infections (STIs).  You should be screened for STIs, including gonorrhea and chlamydia if:  You are sexually active and are younger than 24 years.  You are older than 24 years, and your health care provider tells you that you are at risk for this type of infection.  Your sexual activity has changed since you were last screened, and you are at an increased risk for chlamydia or gonorrhea. Ask your health care provider if you are at risk.  If you are at risk of being infected with HIV, it is recommended that you take a prescription medicine daily to prevent HIV infection. This is called pre-exposure prophylaxis (PrEP). You are considered at risk if:  You are a man who has sex with other men (MSM).  You are a heterosexual man who is sexually active with multiple partners.  You take drugs by injection.  You are sexually active with a partner who has HIV.  Talk with your health care provider about whether you are at high risk of being infected with HIV. If you choose to begin PrEP, you should first be tested for HIV. You should then be tested every 3 months for as long as you are taking PrEP.  Use sunscreen. Apply sunscreen liberally and repeatedly throughout the day. You should seek shade when your shadow is shorter than you. Protect yourself by wearing long sleeves, pants, a wide-brimmed hat, and sunglasses year round whenever you are outdoors.  Tell your health care provider of new moles or changes in moles, especially if there is a change in shape or color. Also, tell your health care provider if a mole is larger than the size of a pencil eraser.  A one-time screening for abdominal aortic aneurysm (AAA) and surgical repair of large AAAs by ultrasound is recommended for men aged 65-75 years who are current or former smokers.  Stay current with your vaccines (immunizations). Document Released: 08/30/2007 Document Revised: 03/08/2013 Document Reviewed: 07/29/2010 [facility] Patient  Information 2015 [place], [place]. This information is not intended to replace advice given to you by your health care provider. Make sure you discuss any questions you have with your health care provider.

## 2014-09-13 NOTE — Telephone Encounter (Signed)
Samples provided at front desk, pt aware.

## 2014-09-13 NOTE — Assessment & Plan Note (Signed)
stable overall by history and exam, recent data reviewed with pt, and pt to continue medical treatment as before,  to f/u any worsening symptoms or concerns BP Readings from Last 3 Encounters:  09/13/14 122/86  09/06/14 152/95  09/01/14 190/113

## 2014-09-13 NOTE — Telephone Encounter (Signed)
Patient calling the office for samples of medication:   1.  What medication and dosage are you requesting samples for? Brilinta,  Plavix   2.  Are you currently out of this medication? Yes   3. Are you requesting samples to get you through until a mail order prescription arrives? No

## 2014-09-13 NOTE — Progress Notes (Signed)
Subjective:    Patient ID: [name], male    DOB: Sep 25, 1960, 54 y.o.   MRN: [id number]  HPI  Here to f/u, noted to have some irreg to pulse on VS's today, pt asympt - Pt denies chest pain, increased sob or doe, wheezing, orthopnea, PND, increased LE swelling, palpitations, dizziness or syncope.  Pt denies new neurological symptoms such as new headache, or facial or extremity weakness or numbness   Pt denies polydipsia, polyuria, Now for HD ongoing. No prior hx of afib, on plavix,  Slightly overdue to see Dr Henrine Screws Has appt Dr Jobie Quaker July 8.  Needs bridge meds to get to cardiology, out of several meds, asks for samples which I done have.  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 Denies worsening reflux, abd pain, dysphagia, n/v, bowel change or blood. Past Medical History  Diagnosis Date  . ACHALASIA   . CEREBROVASCULAR ACCIDENT, HX OF   . Headache(784.0)   . HYPERLIPIDEMIA     takes crestor daily  . RENAL CALCULUS, HX OF   . H/O hiatal hernia   . CAD (coronary artery disease), 3 vessel significant disease.    . Impaired glucose tolerance   . S/P PTCA (percutaneous transluminal coronary angioplasty), 06/30/11 07/01/2011  . History of diastolic dysfunction, grade 2 by echo   . S/P coronary artery stent placement, PTCA/DES Resolute to mid-LAD via the LIMA graft, and PTCA of apical 95% stenosis 07/05/11 07/04/2011  . Seizures     INSULIN INDUCED  . ANEMIA-NOS   . NSTEMI (non-ST elevated myocardial infarction), 06/30/11 07/01/2011  . DEGENERATIVE JOINT DISEASE, SHOULDER     knee  . Gout     takes Uloric daily  . GERD     no meds required  . History of colon polyps   . HYPERTENSION     takes Norvasc,Catapress,Hydralazine,Imdur,and Metoprolol daily  . Insomnia     takes Ambien nightly  . Hyperlipidemia   . Family history of adverse reaction to anesthesia     Mother, hard to awaken  . Stroke     93/2005/2006/2007;left sided weakness.  1993 12/2013  . Sleep apnea     SLEEP STUDY  IN [place] , NO MACHINE YET  1 YEAR  . DEPRESSION     2006- not depressed any longer  . RENAL INSUFFICIENCY     12/2013- TThSat  . CKD (chronic kidney disease) stage 4, GFR 15-29 ml/min   . Constipation    Past Surgical History  Procedure Laterality Date  . Tonsillectomy    . Stomach surgery  2009    achalasia  . Knee surgery Right 01/07/2011    arthroscopy  . Insertion of dialysis catheter  04/01/2011    Procedure: INSERTION OF DIALYSIS CATHETER;  Surgeon: Nilda Simmer, MD;  Location: [facility] OR;  Service: Vascular;  Laterality: N/A;  . Cardiac catheterization    . Av fistula placement  12/02/2011    Procedure: ARTERIOVENOUS (AV) FISTULA CREATION;  Surgeon: Fransisco Hertz, MD;  Location: [facility] OR;  Service: Vascular;  Laterality: Left;  Ultrasound Guided  . Subclavian stent placement  12/20/2011  . Coronary artery bypass graft  04/01/2011    Procedure: CORONARY ARTERY BYPASS GRAFTING (CABG);  Surgeon: Kathlee Nations Suann Larry, MD;  Location: [facility] OR;  Service: Open Heart Surgery;  Laterality: N/A;  . Coronary angioplasty      06/30/11   AT [facility]  . Colonoscopy    . Ligation of arteriovenous  fistula Left 06/08/2012  Procedure: LIGATION OF ARTERIOVENOUS  FISTULA;  Surgeon: Fransisco Hertz, MD;  Location: [facility] OR;  Service: Vascular;  Laterality: Left;  . Av fistula placement Left 06/08/2012    Procedure: ARTERIOVENOUS (AV) FISTULA CREATION;  Surgeon: Fransisco Hertz, MD;  Location: [facility] OR;  Service: Vascular;  Laterality: Left;  . Left heart catheterization with coronary angiogram N/A 03/28/2011    Procedure: LEFT HEART CATHETERIZATION WITH CORONARY ANGIOGRAM;  Surgeon: Lennette Bihari, MD;  Location: [facility] CATH LAB;  Service: Cardiovascular;  Laterality: N/A;  pending creatnine  . Left heart catheterization with coronary/graft angiogram N/A 06/30/2011    Procedure: LEFT HEART CATHETERIZATION WITH Isabel Caprice;  Surgeon: Thurmon Fair, MD;  Location: [facility] CATH LAB;  Service: Cardiovascular;  Laterality:  N/A;  . Percutaneous coronary intervention-balloon only  06/30/2011    Procedure: PERCUTANEOUS CORONARY INTERVENTION-BALLOON ONLY;  Surgeon: Thurmon Fair, MD;  Location: [facility] CATH LAB;  Service: Cardiovascular;;  . Percutaneous coronary stent intervention (pci-s) N/A 07/04/2011    Procedure: PERCUTANEOUS CORONARY STENT INTERVENTION (PCI-S);  Surgeon: Lennette Bihari, MD;  Location: [facility] CATH LAB;  Service: Cardiovascular;  Laterality: N/A;  . Arch aortogram N/A 02/19/2012    Procedure: ARCH AORTOGRAM;  Surgeon: Fransisco Hertz, MD;  Location: [facility] CATH LAB;  Service: Cardiovascular;  Laterality: N/A;  . Hernia repair  2013  . Bascilic vein transposition Left 09/06/2014    Procedure: FIRST STAGE BASILIC VEIN TRANSPOSITION ;  Surgeon: Fransisco Hertz, MD;  Location: [facility] OR;  Service: Vascular;  Laterality: Left;    reports that he has never smoked. He quit smokeless tobacco use about 11 years ago. His smokeless tobacco use included Chew. He reports that he drinks about 1.2 oz of alcohol per week. He reports that he does not use illicit drugs. family history includes Cancer in his father and mother; Heart disease in his father and mother; Hyperlipidemia in his mother and other; Hypertension in his mother and other; Stroke in his other. Allergies  Allergen Reactions  . Shrimp [Shellfish Allergy] Shortness Of Breath  . Insulins Other (See Comments)    Patient unsure as to the kind of insulin but states that it has previously caused seizures. This occurred in the hospital in 2006; but in most recent hospitalization (Jan 2013) received insulin but did not have reaction  . Atorvastatin Other (See Comments)    weakness  . Simvastatin Other (See Comments)     abnormal liver tests  . Ultram [Tramadol] Other (See Comments)    Liver enzyme    Current Outpatient Prescriptions on File Prior to Visit  Medication Sig Dispense Refill  . acetaminophen (TYLENOL) 500 MG tablet Take 1,000 mg by mouth every 6 (six) hours as  needed (pain).    Marland Kitchen aspirin EC 81 MG EC tablet Take 1 tablet (81 mg total) by mouth daily.    . cetirizine (ZYRTEC) 10 MG tablet Take 10 mg by mouth daily as needed for allergies.     . cinacalcet (SENSIPAR) 30 MG tablet Take 30 mg by mouth at bedtime.    . febuxostat (ULORIC) 40 MG tablet Take 40 mg by mouth daily.     Marland Kitchen GREEN TEA, CAMILLIA SINENSIS, PO Take 1 capsule by mouth daily.     . hydrALAZINE (APRESOLINE) 10 MG tablet Take 10 mg by mouth 2 (two) times daily.    . isosorbide mononitrate (IMDUR) 30 MG 24 hr tablet Take 1 tablet (30 mg total) by mouth daily. 30 tablet 6  . lanthanum (  [name]) 1000 MG chewable tablet Chew 2,000 mg by mouth 3 (three) times daily with meals.    . multivitamin (RENA-VIT) TABS tablet Take 1 tablet by mouth at bedtime.     Marland Kitchen NITROSTAT 0.4 MG SL tablet PLACE 1 TABLET UNDER TONGUE AS NEEDED FOR CHEST PAIN EVERY 5 MINUTES (MAX 3DOSES) 25 tablet 4  . oxyCODONE (ROXICODONE) 5 MG immediate release tablet Take 1 tablet (5 mg total) by mouth every 4 (four) hours as needed. 20 tablet 0  . terazosin (HYTRIN) 10 MG capsule TAKE 1 CAPSULE BY MOUTH AT BEDTIME. 90 capsule 2  . zolpidem (AMBIEN) 10 MG tablet Take 1 tablet (10 mg total) by mouth at bedtime as needed for sleep. 30 tablet 5  . ciclopirox (PENLAC) 8 % solution Apply topically at bedtime. Apply over nail and surrounding skin. Apply daily over previous coat. After seven (7) days, may remove with alcohol and continue cycle. (Patient not taking: Reported on 09/06/2014) 6.6 mL 2  . cloNIDine (CATAPRES) 0.2 MG tablet Take 0.2 mg by mouth 2 (two) times daily.     . clopidogrel (PLAVIX) 75 MG tablet Take 1 tablet (75 mg total) by mouth at bedtime. (Patient not taking: Reported on 09/13/2014) 90 tablet 3  . KRILL OIL PO Take 1 capsule by mouth 2 (two) times daily.     . metoprolol (LOPRESSOR) 50 MG tablet TAKE 1 TABLET BY MOUTH TWICE A DAY (Patient not taking: Reported on 09/13/2014) 180 tablet 3  . ranolazine (RANEXA) 500 MG  12 hr tablet Take 500 mg by mouth daily.    . rosuvastatin (CRESTOR) 20 MG tablet Take 1 tablet (20 mg total) by mouth every evening. (Patient not taking: Reported on 09/13/2014) 90 tablet 3  . ticagrelor (BRILINTA) 90 MG TABS tablet Take 90 mg by mouth daily.     No current facility-administered medications on file prior to visit.    Review of Systems  Constitutional: Negative for unusual diaphoresis or night sweats HENT: Negative for ringing in ear or discharge Eyes: Negative for double vision or worsening visual disturbance.  Respiratory: Negative for choking and stridor.   Gastrointestinal: Negative for vomiting or other signifcant bowel change Genitourinary: Negative for hematuria or change in urine volume.  Musculoskeletal: Negative for other MSK pain or swelling Skin: Negative for color change and worsening wound.  Neurological: Negative for tremors and numbness other than noted  Psychiatric/Behavioral: Negative for decreased concentration or agitation other than above       Objective:   Physical Exam BP 122/86 mmHg  Pulse 71  Temp(Src) 98.4 F (36.9 C) (Oral)  Ht 6' (1.829 m)  Wt 221 lb 12 oz (100.585 kg)  BMI 30.07 kg/m2  SpO2 98% VS noted,  Constitutional: Pt appears in no significant distress HENT: Head: NCAT.  Right Ear: External ear normal.  Left Ear: External ear normal.  Eyes: . Pupils are equal, round, and reactive to light. Conjunctivae and EOM are normal Neck: Normal range of motion. Neck supple.  Cardiovascular: Normal rate and mostly regular rhythm but ? PVCs vs irreg    Pulmonary/Chest: Effort normal and breath sounds without rales or wheezing.  Abd:  Soft, NT, ND, + BS Neurological: Pt is alert. Not confused , motor grossly intact Skin: Skin is warm. No rash, no LE edema Psychiatric: Pt behavior is normal. No agitation.      Assessment & Plan:

## 2014-09-14 DIAGNOSIS — I120 Hypertensive chronic kidney disease with stage 5 chronic kidney disease or end stage renal disease: Secondary | ICD-10-CM | POA: Diagnosis not present

## 2014-09-14 DIAGNOSIS — N2581 Secondary hyperparathyroidism of renal origin: Secondary | ICD-10-CM | POA: Diagnosis not present

## 2014-09-14 DIAGNOSIS — N186 End stage renal disease: Secondary | ICD-10-CM | POA: Diagnosis not present

## 2014-09-14 DIAGNOSIS — Z992 Dependence on renal dialysis: Secondary | ICD-10-CM | POA: Diagnosis not present

## 2014-09-14 DIAGNOSIS — D631 Anemia in chronic kidney disease: Secondary | ICD-10-CM | POA: Diagnosis not present

## 2014-09-14 DIAGNOSIS — D509 Iron deficiency anemia, unspecified: Secondary | ICD-10-CM | POA: Diagnosis not present

## 2014-09-16 DIAGNOSIS — D631 Anemia in chronic kidney disease: Secondary | ICD-10-CM | POA: Diagnosis not present

## 2014-09-16 DIAGNOSIS — N2581 Secondary hyperparathyroidism of renal origin: Secondary | ICD-10-CM | POA: Diagnosis not present

## 2014-09-16 DIAGNOSIS — D509 Iron deficiency anemia, unspecified: Secondary | ICD-10-CM | POA: Diagnosis not present

## 2014-09-16 DIAGNOSIS — N186 End stage renal disease: Secondary | ICD-10-CM | POA: Diagnosis not present

## 2014-09-19 ENCOUNTER — Encounter: Payer: Self-pay | Admitting: *Deleted

## 2014-09-19 DIAGNOSIS — D631 Anemia in chronic kidney disease: Secondary | ICD-10-CM | POA: Diagnosis not present

## 2014-09-19 DIAGNOSIS — N186 End stage renal disease: Secondary | ICD-10-CM | POA: Diagnosis not present

## 2014-09-19 DIAGNOSIS — D509 Iron deficiency anemia, unspecified: Secondary | ICD-10-CM | POA: Diagnosis not present

## 2014-09-19 DIAGNOSIS — N2581 Secondary hyperparathyroidism of renal origin: Secondary | ICD-10-CM | POA: Diagnosis not present

## 2014-09-20 DIAGNOSIS — I10 Essential (primary) hypertension: Secondary | ICD-10-CM | POA: Diagnosis not present

## 2014-09-20 DIAGNOSIS — Z992 Dependence on renal dialysis: Secondary | ICD-10-CM | POA: Diagnosis not present

## 2014-09-20 DIAGNOSIS — Y841 Kidney dialysis as the cause of abnormal reaction of the patient, or of later complication, without mention of misadventure at the time of the procedure: Secondary | ICD-10-CM | POA: Diagnosis not present

## 2014-09-20 DIAGNOSIS — N186 End stage renal disease: Secondary | ICD-10-CM | POA: Diagnosis not present

## 2014-09-20 DIAGNOSIS — E785 Hyperlipidemia, unspecified: Secondary | ICD-10-CM | POA: Diagnosis not present

## 2014-09-21 DIAGNOSIS — N2581 Secondary hyperparathyroidism of renal origin: Secondary | ICD-10-CM | POA: Diagnosis not present

## 2014-09-21 DIAGNOSIS — N186 End stage renal disease: Secondary | ICD-10-CM | POA: Diagnosis not present

## 2014-09-21 DIAGNOSIS — D631 Anemia in chronic kidney disease: Secondary | ICD-10-CM | POA: Diagnosis not present

## 2014-09-21 DIAGNOSIS — D509 Iron deficiency anemia, unspecified: Secondary | ICD-10-CM | POA: Diagnosis not present

## 2014-09-22 ENCOUNTER — Telehealth: Payer: Self-pay | Admitting: *Deleted

## 2014-09-22 ENCOUNTER — Ambulatory Visit (INDEPENDENT_AMBULATORY_CARE_PROVIDER_SITE_OTHER): Payer: Medicare Other | Admitting: Cardiovascular Disease

## 2014-09-22 ENCOUNTER — Encounter: Payer: Self-pay | Admitting: Cardiovascular Disease

## 2014-09-22 VITALS — BP 160/90 | HR 88 | Ht 72.0 in | Wt 226.5 lb

## 2014-09-22 DIAGNOSIS — I10 Essential (primary) hypertension: Secondary | ICD-10-CM

## 2014-09-22 DIAGNOSIS — E785 Hyperlipidemia, unspecified: Secondary | ICD-10-CM

## 2014-09-22 DIAGNOSIS — Z951 Presence of aortocoronary bypass graft: Secondary | ICD-10-CM | POA: Diagnosis not present

## 2014-09-22 MED ORDER — CLOPIDOGREL BISULFATE 75 MG PO TABS: 75.0000 mg | ORAL_TABLET | Freq: Every day | ORAL | Status: DC

## 2014-09-22 MED ORDER — RANOLAZINE ER 500 MG PO TB12: 500.0000 mg | ORAL_TABLET | Freq: Every day | ORAL | Status: DC

## 2014-09-22 NOTE — Patient Instructions (Signed)
Your physician wants you to follow-up in: 1 year with Dr Berry. You will receive a reminder letter in the mail two months in advance. If you don't receive a letter, please call our office to schedule the follow-up appointment.  

## 2014-09-22 NOTE — Telephone Encounter (Signed)
Patient dropped off paperwork to be filled out concerning his cardiac status.  The paperwork was concerning his ability to drive.  We filled out just the cardiology portion and returned the paperwork to the patient.

## 2014-09-22 NOTE — Assessment & Plan Note (Signed)
History of hyperlipidemia on Crestor with his most recent lab work performed 12/30/48 revealing a total cholesterol of 57, LDL 89 and HDL of 47

## 2014-09-22 NOTE — Assessment & Plan Note (Signed)
History of hypertension blood pressure measured today at 160/90. He is on hydralazine and Lopressor. Continue current meds at current dosing

## 2014-09-22 NOTE — Assessment & Plan Note (Signed)
History of coronary artery disease status post coronary artery bypass grafting  04/01/11 by Dr. Kathlee Nations Trigt with a LIMA to his LAD, vein to an obtuse marginal branch one and 2 and to the posterior lateral branch. His EF was apparently normal pre-and postoperatively.he had a chronic Physician perform a Dr. Tresa Endo in April of last year and had PCI of his OM graft insertion which was too small to stent as well as stenting of his LAD beyond his LIMA insertion. A Myoview stress test performed 06/28/13 showed mild anteroapical ischemia. He has had no chest pain to the last saw him.

## 2014-09-22 NOTE — Progress Notes (Signed)
09/22/2014 [name]   02-Jul-1960  [id number]  Primary Physician Gregory Barre, MD Primary Cardiologist: Gregory Gess MD [name]   HPI:  The patient is a 54 year old, mild to moderately overweight, divorced, African American male, father of one 68-year-old son who is accompanied by his friend [name] today.. I saw him 08/09/13.Marland Kitchen He has a history of CAD status post bypass grafting by Dr. Kathlee Nations Glenn April 01, 2011 with a LIMA to his LAD, a vein to an obtuse marginal branch 1 and 2, and to the posterolateral branch. His EF was apparently normal pre and postoperatively. He also has difficult to control hypertension, chronic renal insufficiency, now stage 5, for which he sees Dr. Elvis Glenn. He is scheduled to have an AV fistula placed by Dr. Imogene Glenn sometime in the next several weeks in anticipation of dialysis at some point in the future.   He was admitted in April with substernal chest pain and anterolateral T-wave inversion and was cathed and intervened on by Dr. Tresa Glenn. He had PCI of his OM vein graft insertion which was too small to stent, and stenting of the LAD beyond his LIMA insertion.  He started hemodialysis in October of last year and apparently had a stroke at the same time as well. He denies chest pain or shortness of breath.   Current Outpatient Prescriptions  Medication Sig Dispense Refill  . acetaminophen (TYLENOL) 500 MG tablet Take 1,000 mg by mouth every 6 (six) hours as needed (pain).    Marland Kitchen aspirin EC 81 MG EC tablet Take 1 tablet (81 mg total) by mouth daily.    . cetirizine (ZYRTEC) 10 MG tablet Take 10 mg by mouth daily as needed for allergies.     . ciclopirox (PENLAC) 8 % solution Apply topically at bedtime. Apply over nail and surrounding skin. Apply daily over previous coat. After seven (7) days, may remove with alcohol and continue cycle. 6.6 mL 2  . cinacalcet (SENSIPAR) 30 MG tablet Take 30 mg by mouth at bedtime.    . cloNIDine (CATAPRES)  0.2 MG tablet Take 1 tablet (0.2 mg total) by mouth 2 (two) times daily. 60 tablet 11  . clopidogrel (PLAVIX) 75 MG tablet Take 1 tablet (75 mg total) by mouth at bedtime. 90 tablet 0  . febuxostat (ULORIC) 40 MG tablet Take 40 mg by mouth daily.     Marland Kitchen GREEN TEA, CAMILLIA SINENSIS, PO Take 1 capsule by mouth daily.     . hydrALAZINE (APRESOLINE) 10 MG tablet Take 10 mg by mouth 2 (two) times daily.    . isosorbide mononitrate (IMDUR) 30 MG 24 hr tablet Take 1 tablet (30 mg total) by mouth daily. 30 tablet 6  . KRILL OIL PO Take 1 capsule by mouth 2 (two) times daily.     Marland Kitchen lanthanum ([name]) 1000 MG chewable tablet Chew 2,000 mg by mouth 3 (three) times daily with meals.    . metoprolol (LOPRESSOR) 50 MG tablet Take 1 tablet (50 mg total) by mouth 2 (two) times daily. 180 tablet 3  . multivitamin (RENA-VIT) TABS tablet Take 1 tablet by mouth at bedtime.     Marland Kitchen NITROSTAT 0.4 MG SL tablet PLACE 1 TABLET UNDER TONGUE AS NEEDED FOR CHEST PAIN EVERY 5 MINUTES (MAX 3DOSES) 25 tablet 4  . ranolazine (RANEXA) 500 MG 12 hr tablet Take 1 tablet (500 mg total) by mouth daily. 30 tablet 0  . rosuvastatin (CRESTOR) 20 MG tablet Take 1 tablet (  20 mg total) by mouth every evening. 90 tablet 3  . terazosin (HYTRIN) 10 MG capsule TAKE 1 CAPSULE BY MOUTH AT BEDTIME. 90 capsule 2  . ticagrelor (BRILINTA) 90 MG TABS tablet Take 1 tablet (90 mg total) by mouth daily. 32 tablet 0  . zolpidem (AMBIEN) 10 MG tablet Take 1 tablet (10 mg total) by mouth at bedtime as needed for sleep. 30 tablet 5   No current facility-administered medications for this visit.    Allergies  Allergen Reactions  . Shrimp [Shellfish Allergy] Shortness Of Breath  . Insulins Other (See Comments)    Patient unsure as to the kind of insulin but states that it has previously caused seizures. This occurred in the hospital in 2006; but in most recent hospitalization (Jan 2013) received insulin but did not have reaction  . Atorvastatin Other  (See Comments)    weakness  . Simvastatin Other (See Comments)     abnormal liver tests  . Ultram [Tramadol] Other (See Comments)    Liver enzyme     History   Social History  . Marital Status: Divorced    Spouse Name: N/A  . Number of Children: N/A  . Years of Education: N/A   Occupational History  . Not on file.   Social History Main Topics  . Smoking status: Never Smoker   . Smokeless tobacco: Former Neurosurgeon    Types: Chew    Quit date: 03/18/2003     Comment: chewed tobacco   . Alcohol Use: 1.2 oz/week    0 Standard drinks or equivalent, 2 Shots of liquor per week     Comment: rarely; beer  . Drug Use: No  . Sexual Activity:    Partners: Male   Other Topics Concern  . Not on file   Social History Narrative     Review of Systems: General: negative for chills, fever, night sweats or weight changes.  Cardiovascular: negative for chest pain, dyspnea on exertion, edema, orthopnea, palpitations, paroxysmal nocturnal dyspnea or shortness of breath Dermatological: negative for rash Respiratory: negative for cough or wheezing Urologic: negative for hematuria Abdominal: negative for nausea, vomiting, diarrhea, bright red blood per rectum, melena, or hematemesis Neurologic: negative for visual changes, syncope, or dizziness All other systems reviewed and are otherwise negative except as noted above.    Blood pressure 160/90, pulse 88, height 6' (1.829 m), weight 226 lb 8 oz (102.74 kg).  General appearance: alert and no distress Neck: no adenopathy, no JVD, supple, symmetrical, trachea midline, thyroid not enlarged, symmetric, no tenderness/mass/nodules and lleft carotid bruit versus transmitted sound from his AV fistula Lungs: clear to auscultation bilaterally Heart: regular rate and rhythm, S1, S2 normal, no murmur, click, rub or gallop Extremities: extremities normal, atraumatic, no cyanosis or edema  EKG sinus rhythm at 91 with evidence of left ventricular  hypertrophy and poor R-wave progression with left axis deviation. I personally reviewed this EKG  ASSESSMENT AND PLAN:   S/P CABG x 4: 04/01/11-(LIMA to LAD, SVG to PL branch of RCA, Sequential SVG to OM1/2) History of coronary artery disease status post coronary artery bypass grafting  04/01/11 by Dr. Kathlee Nations Glenn with a LIMA to his LAD, vein to an obtuse marginal branch one and 2 and to the posterior lateral branch. His EF was apparently normal pre-and postoperatively.he had a chronic Physician perform a Dr. Tresa Glenn in April of last year and had PCI of his OM graft insertion which was too small to stent as well as  stenting of his LAD beyond his LIMA insertion. A Myoview stress test performed 06/28/13 showed mild anteroapical ischemia. He has had no chest pain to the last saw him.  Hypertension, accelerated History of hypertension blood pressure measured today at 160/90. He is on hydralazine and Lopressor. Continue current meds at current dosing  Hyperlipidemia History of hyperlipidemia on Crestor with his most recent lab work performed 12/30/48 revealing a total cholesterol of 57, LDL 89 and HDL of 47      Gregory Gess MD [facility], [facility] 09/22/2014 4:48 PM

## 2014-09-23 DIAGNOSIS — N186 End stage renal disease: Secondary | ICD-10-CM | POA: Diagnosis not present

## 2014-09-23 DIAGNOSIS — D631 Anemia in chronic kidney disease: Secondary | ICD-10-CM | POA: Diagnosis not present

## 2014-09-23 DIAGNOSIS — N2581 Secondary hyperparathyroidism of renal origin: Secondary | ICD-10-CM | POA: Diagnosis not present

## 2014-09-23 DIAGNOSIS — D509 Iron deficiency anemia, unspecified: Secondary | ICD-10-CM | POA: Diagnosis not present

## 2014-09-26 DIAGNOSIS — N2581 Secondary hyperparathyroidism of renal origin: Secondary | ICD-10-CM | POA: Diagnosis not present

## 2014-09-26 DIAGNOSIS — N186 End stage renal disease: Secondary | ICD-10-CM | POA: Diagnosis not present

## 2014-09-26 DIAGNOSIS — D631 Anemia in chronic kidney disease: Secondary | ICD-10-CM | POA: Diagnosis not present

## 2014-09-26 DIAGNOSIS — D509 Iron deficiency anemia, unspecified: Secondary | ICD-10-CM | POA: Diagnosis not present

## 2014-09-28 DIAGNOSIS — D509 Iron deficiency anemia, unspecified: Secondary | ICD-10-CM | POA: Diagnosis not present

## 2014-09-28 DIAGNOSIS — N2581 Secondary hyperparathyroidism of renal origin: Secondary | ICD-10-CM | POA: Diagnosis not present

## 2014-09-28 DIAGNOSIS — D631 Anemia in chronic kidney disease: Secondary | ICD-10-CM | POA: Diagnosis not present

## 2014-09-28 DIAGNOSIS — N186 End stage renal disease: Secondary | ICD-10-CM | POA: Diagnosis not present

## 2014-09-30 DIAGNOSIS — N186 End stage renal disease: Secondary | ICD-10-CM | POA: Diagnosis not present

## 2014-09-30 DIAGNOSIS — D509 Iron deficiency anemia, unspecified: Secondary | ICD-10-CM | POA: Diagnosis not present

## 2014-09-30 DIAGNOSIS — N2581 Secondary hyperparathyroidism of renal origin: Secondary | ICD-10-CM | POA: Diagnosis not present

## 2014-09-30 DIAGNOSIS — D631 Anemia in chronic kidney disease: Secondary | ICD-10-CM | POA: Diagnosis not present

## 2014-10-03 ENCOUNTER — Encounter: Payer: Self-pay | Admitting: Vascular Surgery

## 2014-10-03 DIAGNOSIS — N186 End stage renal disease: Secondary | ICD-10-CM | POA: Diagnosis not present

## 2014-10-03 DIAGNOSIS — D631 Anemia in chronic kidney disease: Secondary | ICD-10-CM | POA: Diagnosis not present

## 2014-10-03 DIAGNOSIS — N2581 Secondary hyperparathyroidism of renal origin: Secondary | ICD-10-CM | POA: Diagnosis not present

## 2014-10-03 DIAGNOSIS — D509 Iron deficiency anemia, unspecified: Secondary | ICD-10-CM | POA: Diagnosis not present

## 2014-10-04 ENCOUNTER — Ambulatory Visit (INDEPENDENT_AMBULATORY_CARE_PROVIDER_SITE_OTHER): Payer: Medicare Other | Admitting: Internal Medicine

## 2014-10-04 ENCOUNTER — Encounter: Payer: Self-pay | Admitting: Internal Medicine

## 2014-10-04 VITALS — BP 184/110 | HR 81 | Temp 98.4°F | Ht 72.0 in | Wt 231.0 lb

## 2014-10-04 DIAGNOSIS — I10 Essential (primary) hypertension: Secondary | ICD-10-CM

## 2014-10-04 DIAGNOSIS — Z992 Dependence on renal dialysis: Secondary | ICD-10-CM | POA: Diagnosis not present

## 2014-10-04 DIAGNOSIS — R7302 Impaired glucose tolerance (oral): Secondary | ICD-10-CM | POA: Diagnosis not present

## 2014-10-04 DIAGNOSIS — N186 End stage renal disease: Secondary | ICD-10-CM

## 2014-10-04 DIAGNOSIS — N320 Bladder-neck obstruction: Secondary | ICD-10-CM

## 2014-10-04 NOTE — Progress Notes (Signed)
Pre visit review using our clinic review tool, if applicable. No additional management support is needed unless otherwise documented below in the visit note. 

## 2014-10-04 NOTE — Patient Instructions (Signed)
Please continue all other medications as before, and refills have been done if requested.  Please have the pharmacy call with any other refills you may need.  Please continue your efforts at being more active, low cholesterol diet, and weight control.  You are otherwise up to date with prevention measures today.  Please keep your appointments with your specialists as you may have planned  Please go to the LAB in the Basement (turn left off the elevator) for the tests to be done - the PSA for tomorrow  You will be contacted by phone if any changes need to be made immediately.  Otherwise, you will receive a letter about your results with an explanation, but please check with MyChart first.  Please remember to sign up for MyChart if you have not done so, as this will be important to you in the future with finding out test results, communicating by private email, and scheduling acute appointments online when needed.

## 2014-10-05 DIAGNOSIS — N186 End stage renal disease: Secondary | ICD-10-CM | POA: Diagnosis not present

## 2014-10-05 DIAGNOSIS — D631 Anemia in chronic kidney disease: Secondary | ICD-10-CM | POA: Diagnosis not present

## 2014-10-05 DIAGNOSIS — D509 Iron deficiency anemia, unspecified: Secondary | ICD-10-CM | POA: Diagnosis not present

## 2014-10-05 DIAGNOSIS — N2581 Secondary hyperparathyroidism of renal origin: Secondary | ICD-10-CM | POA: Diagnosis not present

## 2014-10-06 ENCOUNTER — Encounter: Payer: Self-pay | Admitting: Vascular Surgery

## 2014-10-06 ENCOUNTER — Encounter: Payer: Self-pay | Admitting: Cardiovascular Disease

## 2014-10-06 ENCOUNTER — Ambulatory Visit (INDEPENDENT_AMBULATORY_CARE_PROVIDER_SITE_OTHER): Payer: Self-pay | Admitting: Vascular Surgery

## 2014-10-06 ENCOUNTER — Other Ambulatory Visit (INDEPENDENT_AMBULATORY_CARE_PROVIDER_SITE_OTHER): Payer: Medicare Other

## 2014-10-06 VITALS — BP 147/86 | HR 93 | Temp 98.3°F | Ht 72.0 in | Wt 227.0 lb

## 2014-10-06 DIAGNOSIS — N186 End stage renal disease: Secondary | ICD-10-CM

## 2014-10-06 DIAGNOSIS — Z992 Dependence on renal dialysis: Secondary | ICD-10-CM

## 2014-10-06 DIAGNOSIS — N320 Bladder-neck obstruction: Secondary | ICD-10-CM

## 2014-10-06 LAB — PSA: PSA: 3.78 ng/mL (ref 0.10–4.00)

## 2014-10-06 NOTE — Progress Notes (Signed)
    Postoperative Access Visit   History of Present Illness  [name] is a 54 y.o. 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  The patient's wounds are healed.  The patient notes no steal symptoms.  The patient is able to complete their activities of daily living.  The patient's current symptoms are: none.  For VQI Use Only  PRE-ADM LIVING: Home  AMB STATUS: Ambulatory  Physical Examination Filed Vitals:   10/06/14 0834  BP: 147/86  Pulse: 93  Temp: 98.3 F (36.8 C)    LUE: Incision is healed, skin feels warm, hand grip is 5/5, sensation in digits is intact, palpable thrill, bruit can be auscultated , on Sonosite: 4.5-5.5 mm in diameter  Medical Decision Making  [name] is a 54 y.o. 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  Leonides Sake, MD [facility] Office: [phone] Pager: [phone]  10/06/2014, 8:47 AM

## 2014-10-07 DIAGNOSIS — D631 Anemia in chronic kidney disease: Secondary | ICD-10-CM | POA: Diagnosis not present

## 2014-10-07 DIAGNOSIS — N2581 Secondary hyperparathyroidism of renal origin: Secondary | ICD-10-CM | POA: Diagnosis not present

## 2014-10-07 DIAGNOSIS — N186 End stage renal disease: Secondary | ICD-10-CM | POA: Diagnosis not present

## 2014-10-07 DIAGNOSIS — D509 Iron deficiency anemia, unspecified: Secondary | ICD-10-CM | POA: Diagnosis not present

## 2014-10-07 NOTE — Assessment & Plan Note (Signed)
To cont HD, for PSA on pt request for w/u of renal transplant candidacy per pt

## 2014-10-07 NOTE — Assessment & Plan Note (Signed)
stable overall by history and exam, recent data reviewed with pt, and pt to continue medical treatment as before,  to f/u any worsening symptoms or concerns Lab Results  Component Value Date   HGBA1C 5.6 12/30/2013    

## 2014-10-07 NOTE — Assessment & Plan Note (Signed)
stable overall by history and exam, recent data reviewed with pt, and pt to continue medical treatment as before,  to f/u any worsening symptoms or concerns BP Readings from Last 3 Encounters:  10/06/14 147/86  10/04/14 184/110  09/22/14 160/90

## 2014-10-07 NOTE — Progress Notes (Signed)
Subjective:    Patient ID: [name], male    DOB: 1960/09/30, 54 y.o.   MRN: [id number]  HPI  Here to f/u, is being considered for renal transplant, asked to return here to request completion of eval with PSA, Pt denies chest pain, increased sob or doe, wheezing, orthopnea, PND, increased LE swelling, palpitations, dizziness or syncope.  Pt denies new neurological symptoms such as new headache, or facial or extremity weakness or numbness   Pt denies polydipsia, polyuria Past Medical History  Diagnosis Date  . ACHALASIA   . CEREBROVASCULAR ACCIDENT, HX OF   . Headache(784.0)   . HYPERLIPIDEMIA     takes crestor daily  . RENAL CALCULUS, HX OF   . H/O hiatal hernia   . CAD (coronary artery disease), 3 vessel significant disease.    . Impaired glucose tolerance   . S/P PTCA (percutaneous transluminal coronary angioplasty), 06/30/11 07/01/2011  . History of diastolic dysfunction, grade 2 by echo   . S/P coronary artery stent placement, PTCA/DES Resolute to mid-LAD via the LIMA graft, and PTCA of apical 95% stenosis 07/05/11 07/04/2011  . Seizures     INSULIN INDUCED  . ANEMIA-NOS   . NSTEMI (non-ST elevated myocardial infarction), 06/30/11 07/01/2011  . DEGENERATIVE JOINT DISEASE, SHOULDER     knee  . Gout     takes Uloric daily  . GERD     no meds required  . History of colon polyps   . HYPERTENSION     takes Norvasc,Catapress,Hydralazine,Imdur,and Metoprolol daily  . Insomnia     takes Ambien nightly  . Hyperlipidemia   . Family history of adverse reaction to anesthesia     Mother, hard to awaken  . Stroke     93/2005/2006/2007;left sided weakness.  1993 12/2013  . Sleep apnea     SLEEP STUDY IN [place] , NO MACHINE YET  1 YEAR  . DEPRESSION     2006- not depressed any longer  . RENAL INSUFFICIENCY     12/2013- TThSat  . CKD (chronic kidney disease) stage 4, GFR 15-29 ml/min   . Constipation    Past Surgical History  Procedure Laterality Date  . Tonsillectomy    .  Stomach surgery  2009    achalasia  . Knee surgery Right 01/07/2011    arthroscopy  . Insertion of dialysis catheter  04/01/2011    Procedure: INSERTION OF DIALYSIS CATHETER;  Surgeon: Nilda Simmer, MD;  Location: [facility] OR;  Service: Vascular;  Laterality: N/A;  . Cardiac catheterization    . Av fistula placement  12/02/2011    Procedure: ARTERIOVENOUS (AV) FISTULA CREATION;  Surgeon: Fransisco Hertz, MD;  Location: [facility] OR;  Service: Vascular;  Laterality: Left;  Ultrasound Guided  . Subclavian stent placement  12/20/2011  . Coronary artery bypass graft  04/01/2011    Procedure: CORONARY ARTERY BYPASS GRAFTING (CABG);  Surgeon: Kathlee Nations Suann Larry, MD;  Location: [facility] OR;  Service: Open Heart Surgery;  Laterality: N/A;  . Coronary angioplasty      06/30/11   AT [facility]  . Colonoscopy    . Ligation of arteriovenous  fistula Left 06/08/2012    Procedure: LIGATION OF ARTERIOVENOUS  FISTULA;  Surgeon: Fransisco Hertz, MD;  Location: [facility] OR;  Service: Vascular;  Laterality: Left;  . Av fistula placement Left 06/08/2012    Procedure: ARTERIOVENOUS (AV) FISTULA CREATION;  Surgeon: Fransisco Hertz, MD;  Location: [facility] OR;  Service: Vascular;  Laterality: Left;  .  Left heart catheterization with coronary angiogram N/A 03/28/2011    Procedure: LEFT HEART CATHETERIZATION WITH CORONARY ANGIOGRAM;  Surgeon: Lennette Bihari, MD;  Location: [facility] CATH LAB;  Service: Cardiovascular;  Laterality: N/A;  pending creatnine  . Left heart catheterization with coronary/graft angiogram N/A 06/30/2011    Procedure: LEFT HEART CATHETERIZATION WITH Isabel Caprice;  Surgeon: Thurmon Fair, MD;  Location: [facility] CATH LAB;  Service: Cardiovascular;  Laterality: N/A;  . Percutaneous coronary intervention-balloon only  06/30/2011    Procedure: PERCUTANEOUS CORONARY INTERVENTION-BALLOON ONLY;  Surgeon: Thurmon Fair, MD;  Location: [facility] CATH LAB;  Service: Cardiovascular;;  . Percutaneous coronary stent intervention (pci-s) N/A 07/04/2011     Procedure: PERCUTANEOUS CORONARY STENT INTERVENTION (PCI-S);  Surgeon: Lennette Bihari, MD;  Location: [facility] CATH LAB;  Service: Cardiovascular;  Laterality: N/A;  . Arch aortogram N/A 02/19/2012    Procedure: ARCH AORTOGRAM;  Surgeon: Fransisco Hertz, MD;  Location: [facility] CATH LAB;  Service: Cardiovascular;  Laterality: N/A;  . Hernia repair  2013  . Bascilic vein transposition Left 09/06/2014    Procedure: FIRST STAGE BASILIC VEIN TRANSPOSITION ;  Surgeon: Fransisco Hertz, MD;  Location: [facility] OR;  Service: Vascular;  Laterality: Left;  . Nm myocar perf ejection fraction  06/25/2011    There is evidence of mild ischemia in the basal inferolateral and mid inferolateral regions. (Extent 7 %) The post-stress ejection fraction is 44%. There is mild hypocontractility in the distal inferoapical segment. baseline T wave inversion is noted in leads I, avL, II, V2. This is a low risk scan. The present perfusion study suggests significant myocardial salvage with mild residual ischemia .    reports that he has never smoked. He quit smokeless tobacco use about 11 years ago. His smokeless tobacco use included Chew. He reports that he drinks about 1.2 oz of alcohol per week. He reports that he does not use illicit drugs. family history includes Cancer in his father and mother; Heart disease in his father and mother; Hyperlipidemia in his mother and other; Hypertension in his mother and other; Stroke in his other. Allergies  Allergen Reactions  . Shrimp [Shellfish Allergy] Shortness Of Breath  . Insulins Other (See Comments)    Patient unsure as to the kind of insulin but states that it has previously caused seizures. This occurred in the hospital in 2006; but in most recent hospitalization (Jan 2013) received insulin but did not have reaction  . Atorvastatin Other (See Comments)    weakness  . Simvastatin Other (See Comments)     abnormal liver tests  . Ultram [Tramadol] Other (See Comments)    Liver enzyme    Current  Outpatient Prescriptions on File Prior to Visit  Medication Sig Dispense Refill  . acetaminophen (TYLENOL) 500 MG tablet Take 1,000 mg by mouth every 6 (six) hours as needed (pain).    Marland Kitchen aspirin EC 81 MG EC tablet Take 1 tablet (81 mg total) by mouth daily.    . cetirizine (ZYRTEC) 10 MG tablet Take 10 mg by mouth daily as needed for allergies.     . ciclopirox (PENLAC) 8 % solution Apply topically at bedtime. Apply over nail and surrounding skin. Apply daily over previous coat. After seven (7) days, may remove with alcohol and continue cycle. 6.6 mL 2  . cinacalcet (SENSIPAR) 30 MG tablet Take 30 mg by mouth at bedtime.    . cloNIDine (CATAPRES) 0.2 MG tablet Take 1 tablet (0.2 mg total) by mouth 2 (two) times daily.  60 tablet 11  . clopidogrel (PLAVIX) 75 MG tablet Take 1 tablet (75 mg total) by mouth at bedtime. 90 tablet 0  . febuxostat (ULORIC) 40 MG tablet Take 40 mg by mouth daily.     Marland Kitchen GREEN TEA, CAMILLIA SINENSIS, PO Take 1 capsule by mouth daily.     . hydrALAZINE (APRESOLINE) 10 MG tablet Take 10 mg by mouth 2 (two) times daily.    . isosorbide mononitrate (IMDUR) 30 MG 24 hr tablet Take 1 tablet (30 mg total) by mouth daily. 30 tablet 6  . KRILL OIL PO Take 1 capsule by mouth 2 (two) times daily.     Marland Kitchen lanthanum ([name]) 1000 MG chewable tablet Chew 2,000 mg by mouth 3 (three) times daily with meals.    . metoprolol (LOPRESSOR) 50 MG tablet Take 1 tablet (50 mg total) by mouth 2 (two) times daily. 180 tablet 3  . multivitamin (RENA-VIT) TABS tablet Take 1 tablet by mouth at bedtime.     Marland Kitchen NITROSTAT 0.4 MG SL tablet PLACE 1 TABLET UNDER TONGUE AS NEEDED FOR CHEST PAIN EVERY 5 MINUTES (MAX 3DOSES) 25 tablet 4  . ranolazine (RANEXA) 500 MG 12 hr tablet Take 1 tablet (500 mg total) by mouth daily. 30 tablet 0  . rosuvastatin (CRESTOR) 20 MG tablet Take 1 tablet (20 mg total) by mouth every evening. 90 tablet 3  . terazosin (HYTRIN) 10 MG capsule TAKE 1 CAPSULE BY MOUTH AT BEDTIME. 90  capsule 2  . ticagrelor (BRILINTA) 90 MG TABS tablet Take 1 tablet (90 mg total) by mouth daily. 32 tablet 0  . zolpidem (AMBIEN) 10 MG tablet Take 1 tablet (10 mg total) by mouth at bedtime as needed for sleep. 30 tablet 5   No current facility-administered medications on file prior to visit.   Review of Systems  Constitutional: Negative for unusual diaphoresis or night sweats HENT: Negative for ringing in ear or discharge Eyes: Negative for double vision or worsening visual disturbance.  Respiratory: Negative for choking and stridor.   Gastrointestinal: Negative for vomiting or other signifcant bowel change Genitourinary: Negative for hematuria or change in urine volume.  Musculoskeletal: Negative for other MSK pain or swelling Skin: Negative for color change and worsening wound.  Neurological: Negative for tremors and numbness other than noted  Psychiatric/Behavioral: Negative for decreased concentration or agitation other than above       Objective:   Physical Exam BP 184/110 mmHg  Pulse 81  Temp(Src) 98.4 F (36.9 C) (Oral)  Ht 6' (1.829 m)  Wt 231 lb (104.781 kg)  BMI 31.32 kg/m2  SpO2 97% VS noted,  Constitutional: Pt appears in no significant distress HENT: Head: NCAT.  Right Ear: External ear normal.  Left Ear: External ear normal.  Eyes: . Pupils are equal, round, and reactive to light. Conjunctivae and EOM are normal Neck: Normal range of motion. Neck supple.  Cardiovascular: Normal rate and regular rhythm.   Pulmonary/Chest: Effort normal and breath sounds without rales or wheezing.  Neurological: Pt is alert. Not confused , motor grossly intact Skin: Skin is warm. No rash, no LE edema Psychiatric: Pt behavior is normal. No agitation.     Assessment & Plan:

## 2014-10-10 ENCOUNTER — Ambulatory Visit: Payer: Medicare Other | Admitting: Cardiovascular Disease

## 2014-10-10 DIAGNOSIS — D631 Anemia in chronic kidney disease: Secondary | ICD-10-CM | POA: Diagnosis not present

## 2014-10-10 DIAGNOSIS — N186 End stage renal disease: Secondary | ICD-10-CM | POA: Diagnosis not present

## 2014-10-10 DIAGNOSIS — N2581 Secondary hyperparathyroidism of renal origin: Secondary | ICD-10-CM | POA: Diagnosis not present

## 2014-10-10 DIAGNOSIS — D509 Iron deficiency anemia, unspecified: Secondary | ICD-10-CM | POA: Diagnosis not present

## 2014-10-12 DIAGNOSIS — D631 Anemia in chronic kidney disease: Secondary | ICD-10-CM | POA: Diagnosis not present

## 2014-10-12 DIAGNOSIS — D509 Iron deficiency anemia, unspecified: Secondary | ICD-10-CM | POA: Diagnosis not present

## 2014-10-12 DIAGNOSIS — N186 End stage renal disease: Secondary | ICD-10-CM | POA: Diagnosis not present

## 2014-10-12 DIAGNOSIS — N2581 Secondary hyperparathyroidism of renal origin: Secondary | ICD-10-CM | POA: Diagnosis not present

## 2014-10-13 ENCOUNTER — Encounter: Payer: Self-pay | Admitting: Cardiovascular Disease

## 2014-10-14 DIAGNOSIS — N2581 Secondary hyperparathyroidism of renal origin: Secondary | ICD-10-CM | POA: Diagnosis not present

## 2014-10-14 DIAGNOSIS — N186 End stage renal disease: Secondary | ICD-10-CM | POA: Diagnosis not present

## 2014-10-14 DIAGNOSIS — D509 Iron deficiency anemia, unspecified: Secondary | ICD-10-CM | POA: Diagnosis not present

## 2014-10-14 DIAGNOSIS — D631 Anemia in chronic kidney disease: Secondary | ICD-10-CM | POA: Diagnosis not present

## 2014-10-15 DIAGNOSIS — I120 Hypertensive chronic kidney disease with stage 5 chronic kidney disease or end stage renal disease: Secondary | ICD-10-CM | POA: Diagnosis not present

## 2014-10-15 DIAGNOSIS — N186 End stage renal disease: Secondary | ICD-10-CM | POA: Diagnosis not present

## 2014-10-15 DIAGNOSIS — Z992 Dependence on renal dialysis: Secondary | ICD-10-CM | POA: Diagnosis not present

## 2014-10-17 DIAGNOSIS — N2581 Secondary hyperparathyroidism of renal origin: Secondary | ICD-10-CM | POA: Diagnosis not present

## 2014-10-17 DIAGNOSIS — D631 Anemia in chronic kidney disease: Secondary | ICD-10-CM | POA: Diagnosis not present

## 2014-10-17 DIAGNOSIS — N186 End stage renal disease: Secondary | ICD-10-CM | POA: Diagnosis not present

## 2014-10-17 DIAGNOSIS — D509 Iron deficiency anemia, unspecified: Secondary | ICD-10-CM | POA: Diagnosis not present

## 2014-10-19 DIAGNOSIS — D631 Anemia in chronic kidney disease: Secondary | ICD-10-CM | POA: Diagnosis not present

## 2014-10-19 DIAGNOSIS — N2581 Secondary hyperparathyroidism of renal origin: Secondary | ICD-10-CM | POA: Diagnosis not present

## 2014-10-19 DIAGNOSIS — D509 Iron deficiency anemia, unspecified: Secondary | ICD-10-CM | POA: Diagnosis not present

## 2014-10-19 DIAGNOSIS — N186 End stage renal disease: Secondary | ICD-10-CM | POA: Diagnosis not present

## 2014-10-20 DIAGNOSIS — N186 End stage renal disease: Secondary | ICD-10-CM | POA: Diagnosis not present

## 2014-10-20 DIAGNOSIS — D631 Anemia in chronic kidney disease: Secondary | ICD-10-CM | POA: Diagnosis not present

## 2014-10-20 DIAGNOSIS — N2581 Secondary hyperparathyroidism of renal origin: Secondary | ICD-10-CM | POA: Diagnosis not present

## 2014-10-20 DIAGNOSIS — D509 Iron deficiency anemia, unspecified: Secondary | ICD-10-CM | POA: Diagnosis not present

## 2014-10-24 DIAGNOSIS — N2581 Secondary hyperparathyroidism of renal origin: Secondary | ICD-10-CM | POA: Diagnosis not present

## 2014-10-24 DIAGNOSIS — D631 Anemia in chronic kidney disease: Secondary | ICD-10-CM | POA: Diagnosis not present

## 2014-10-24 DIAGNOSIS — N186 End stage renal disease: Secondary | ICD-10-CM | POA: Diagnosis not present

## 2014-10-24 DIAGNOSIS — D509 Iron deficiency anemia, unspecified: Secondary | ICD-10-CM | POA: Diagnosis not present

## 2014-10-26 DIAGNOSIS — D631 Anemia in chronic kidney disease: Secondary | ICD-10-CM | POA: Diagnosis not present

## 2014-10-26 DIAGNOSIS — N2581 Secondary hyperparathyroidism of renal origin: Secondary | ICD-10-CM | POA: Diagnosis not present

## 2014-10-26 DIAGNOSIS — D509 Iron deficiency anemia, unspecified: Secondary | ICD-10-CM | POA: Diagnosis not present

## 2014-10-26 DIAGNOSIS — N186 End stage renal disease: Secondary | ICD-10-CM | POA: Diagnosis not present

## 2014-10-27 DIAGNOSIS — D509 Iron deficiency anemia, unspecified: Secondary | ICD-10-CM | POA: Diagnosis not present

## 2014-10-27 DIAGNOSIS — N2581 Secondary hyperparathyroidism of renal origin: Secondary | ICD-10-CM | POA: Diagnosis not present

## 2014-10-27 DIAGNOSIS — D631 Anemia in chronic kidney disease: Secondary | ICD-10-CM | POA: Diagnosis not present

## 2014-10-27 DIAGNOSIS — N186 End stage renal disease: Secondary | ICD-10-CM | POA: Diagnosis not present

## 2014-10-31 DIAGNOSIS — N186 End stage renal disease: Secondary | ICD-10-CM | POA: Diagnosis not present

## 2014-10-31 DIAGNOSIS — D509 Iron deficiency anemia, unspecified: Secondary | ICD-10-CM | POA: Diagnosis not present

## 2014-10-31 DIAGNOSIS — N2581 Secondary hyperparathyroidism of renal origin: Secondary | ICD-10-CM | POA: Diagnosis not present

## 2014-10-31 DIAGNOSIS — D631 Anemia in chronic kidney disease: Secondary | ICD-10-CM | POA: Diagnosis not present

## 2014-11-02 DIAGNOSIS — D509 Iron deficiency anemia, unspecified: Secondary | ICD-10-CM | POA: Diagnosis not present

## 2014-11-02 DIAGNOSIS — N186 End stage renal disease: Secondary | ICD-10-CM | POA: Diagnosis not present

## 2014-11-02 DIAGNOSIS — N2581 Secondary hyperparathyroidism of renal origin: Secondary | ICD-10-CM | POA: Diagnosis not present

## 2014-11-02 DIAGNOSIS — D631 Anemia in chronic kidney disease: Secondary | ICD-10-CM | POA: Diagnosis not present

## 2014-11-03 DIAGNOSIS — N186 End stage renal disease: Secondary | ICD-10-CM | POA: Diagnosis not present

## 2014-11-03 DIAGNOSIS — D509 Iron deficiency anemia, unspecified: Secondary | ICD-10-CM | POA: Diagnosis not present

## 2014-11-03 DIAGNOSIS — N2581 Secondary hyperparathyroidism of renal origin: Secondary | ICD-10-CM | POA: Diagnosis not present

## 2014-11-03 DIAGNOSIS — D631 Anemia in chronic kidney disease: Secondary | ICD-10-CM | POA: Diagnosis not present

## 2014-11-07 DIAGNOSIS — N2581 Secondary hyperparathyroidism of renal origin: Secondary | ICD-10-CM | POA: Diagnosis not present

## 2014-11-07 DIAGNOSIS — N186 End stage renal disease: Secondary | ICD-10-CM | POA: Diagnosis not present

## 2014-11-07 DIAGNOSIS — D509 Iron deficiency anemia, unspecified: Secondary | ICD-10-CM | POA: Diagnosis not present

## 2014-11-07 DIAGNOSIS — D631 Anemia in chronic kidney disease: Secondary | ICD-10-CM | POA: Diagnosis not present

## 2014-11-09 ENCOUNTER — Encounter: Payer: Self-pay | Admitting: Vascular Surgery

## 2014-11-09 DIAGNOSIS — D509 Iron deficiency anemia, unspecified: Secondary | ICD-10-CM | POA: Diagnosis not present

## 2014-11-09 DIAGNOSIS — N186 End stage renal disease: Secondary | ICD-10-CM | POA: Diagnosis not present

## 2014-11-09 DIAGNOSIS — D631 Anemia in chronic kidney disease: Secondary | ICD-10-CM | POA: Diagnosis not present

## 2014-11-09 DIAGNOSIS — N2581 Secondary hyperparathyroidism of renal origin: Secondary | ICD-10-CM | POA: Diagnosis not present

## 2014-11-10 ENCOUNTER — Ambulatory Visit: Payer: Medicare Other | Admitting: Vascular Surgery

## 2014-11-10 DIAGNOSIS — D509 Iron deficiency anemia, unspecified: Secondary | ICD-10-CM | POA: Diagnosis not present

## 2014-11-10 DIAGNOSIS — D631 Anemia in chronic kidney disease: Secondary | ICD-10-CM | POA: Diagnosis not present

## 2014-11-10 DIAGNOSIS — N186 End stage renal disease: Secondary | ICD-10-CM | POA: Diagnosis not present

## 2014-11-10 DIAGNOSIS — N2581 Secondary hyperparathyroidism of renal origin: Secondary | ICD-10-CM | POA: Diagnosis not present

## 2014-11-14 DIAGNOSIS — N2581 Secondary hyperparathyroidism of renal origin: Secondary | ICD-10-CM | POA: Diagnosis not present

## 2014-11-14 DIAGNOSIS — D631 Anemia in chronic kidney disease: Secondary | ICD-10-CM | POA: Diagnosis not present

## 2014-11-14 DIAGNOSIS — N186 End stage renal disease: Secondary | ICD-10-CM | POA: Diagnosis not present

## 2014-11-14 DIAGNOSIS — D509 Iron deficiency anemia, unspecified: Secondary | ICD-10-CM | POA: Diagnosis not present

## 2014-11-15 DIAGNOSIS — I120 Hypertensive chronic kidney disease with stage 5 chronic kidney disease or end stage renal disease: Secondary | ICD-10-CM | POA: Diagnosis not present

## 2014-11-15 DIAGNOSIS — Z992 Dependence on renal dialysis: Secondary | ICD-10-CM | POA: Diagnosis not present

## 2014-11-15 DIAGNOSIS — N186 End stage renal disease: Secondary | ICD-10-CM | POA: Diagnosis not present

## 2014-11-16 DIAGNOSIS — D631 Anemia in chronic kidney disease: Secondary | ICD-10-CM | POA: Diagnosis not present

## 2014-11-16 DIAGNOSIS — D509 Iron deficiency anemia, unspecified: Secondary | ICD-10-CM | POA: Diagnosis not present

## 2014-11-16 DIAGNOSIS — Z23 Encounter for immunization: Secondary | ICD-10-CM | POA: Diagnosis not present

## 2014-11-16 DIAGNOSIS — N186 End stage renal disease: Secondary | ICD-10-CM | POA: Diagnosis not present

## 2014-11-16 DIAGNOSIS — N2581 Secondary hyperparathyroidism of renal origin: Secondary | ICD-10-CM | POA: Diagnosis not present

## 2014-11-17 ENCOUNTER — Ambulatory Visit: Payer: Medicare Other | Admitting: Podiatry

## 2014-11-18 DIAGNOSIS — N2581 Secondary hyperparathyroidism of renal origin: Secondary | ICD-10-CM | POA: Diagnosis not present

## 2014-11-18 DIAGNOSIS — D509 Iron deficiency anemia, unspecified: Secondary | ICD-10-CM | POA: Diagnosis not present

## 2014-11-18 DIAGNOSIS — D631 Anemia in chronic kidney disease: Secondary | ICD-10-CM | POA: Diagnosis not present

## 2014-11-18 DIAGNOSIS — N186 End stage renal disease: Secondary | ICD-10-CM | POA: Diagnosis not present

## 2014-11-18 DIAGNOSIS — Z23 Encounter for immunization: Secondary | ICD-10-CM | POA: Diagnosis not present

## 2014-11-21 DIAGNOSIS — Z23 Encounter for immunization: Secondary | ICD-10-CM | POA: Diagnosis not present

## 2014-11-21 DIAGNOSIS — N2581 Secondary hyperparathyroidism of renal origin: Secondary | ICD-10-CM | POA: Diagnosis not present

## 2014-11-21 DIAGNOSIS — D631 Anemia in chronic kidney disease: Secondary | ICD-10-CM | POA: Diagnosis not present

## 2014-11-21 DIAGNOSIS — N186 End stage renal disease: Secondary | ICD-10-CM | POA: Diagnosis not present

## 2014-11-21 DIAGNOSIS — D509 Iron deficiency anemia, unspecified: Secondary | ICD-10-CM | POA: Diagnosis not present

## 2014-11-23 DIAGNOSIS — D509 Iron deficiency anemia, unspecified: Secondary | ICD-10-CM | POA: Diagnosis not present

## 2014-11-23 DIAGNOSIS — N186 End stage renal disease: Secondary | ICD-10-CM | POA: Diagnosis not present

## 2014-11-23 DIAGNOSIS — Z23 Encounter for immunization: Secondary | ICD-10-CM | POA: Diagnosis not present

## 2014-11-23 DIAGNOSIS — N2581 Secondary hyperparathyroidism of renal origin: Secondary | ICD-10-CM | POA: Diagnosis not present

## 2014-11-23 DIAGNOSIS — D631 Anemia in chronic kidney disease: Secondary | ICD-10-CM | POA: Diagnosis not present

## 2014-11-24 ENCOUNTER — Encounter: Payer: Self-pay | Admitting: Vascular Surgery

## 2014-11-24 ENCOUNTER — Ambulatory Visit (INDEPENDENT_AMBULATORY_CARE_PROVIDER_SITE_OTHER): Payer: Self-pay | Admitting: Vascular Surgery

## 2014-11-24 VITALS — BP 126/91 | HR 82 | Temp 98.4°F | Resp 16 | Ht 72.0 in | Wt 224.0 lb

## 2014-11-24 DIAGNOSIS — Z992 Dependence on renal dialysis: Secondary | ICD-10-CM

## 2014-11-24 DIAGNOSIS — N186 End stage renal disease: Secondary | ICD-10-CM

## 2014-11-24 NOTE — Progress Notes (Signed)
  Postoperative Access Visit   History of Present Illness  [name] is a 54 y.o. 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 The patient's wounds are healed. The patient notes no steal symptoms. The patient is able to complete their activities of daily living. The patient's current symptoms are: none.  Past Medical History  Diagnosis Date  . ACHALASIA   . CEREBROVASCULAR ACCIDENT, HX OF   . Headache(784.0)   . HYPERLIPIDEMIA     takes crestor daily  . RENAL CALCULUS, HX OF   . H/O hiatal hernia   . CAD (coronary artery disease), 3 vessel significant disease.    . Impaired glucose tolerance   . S/P PTCA (percutaneous transluminal coronary angioplasty), 06/30/11 07/01/2011  . History of diastolic dysfunction, grade 2 by echo   . S/P coronary artery stent placement, PTCA/DES Resolute to mid-LAD via the LIMA graft, and PTCA of apical 95% stenosis 07/05/11 07/04/2011  . Seizures     INSULIN INDUCED  . ANEMIA-NOS   . NSTEMI (non-ST elevated myocardial infarction), 06/30/11 07/01/2011  . DEGENERATIVE JOINT DISEASE, SHOULDER     knee  . Gout     takes Uloric daily  . GERD     no meds required  . History of colon polyps   . HYPERTENSION     takes Norvasc,Catapress,Hydralazine,Imdur,and Metoprolol daily  . Insomnia     takes Ambien nightly  . Hyperlipidemia   . Family history of adverse reaction to anesthesia     Mother, hard to awaken  . Stroke     93/2005/2006/2007;left sided weakness.  1993 12/2013  . Sleep apnea     SLEEP STUDY IN [place] , NO MACHINE YET  1 YEAR  . DEPRESSION     2006- not depressed any longer  . RENAL INSUFFICIENCY     12/2013- TThSat  . CKD (chronic kidney disease) stage 4, GFR 15-29 ml/min   . Constipation     Past Surgical History  Procedure Laterality Date  . Tonsillectomy    . Stomach surgery  2009    achalasia  . Knee surgery Right 01/07/2011    arthroscopy  . Insertion of  dialysis catheter  04/01/2011    Procedure: INSERTION OF DIALYSIS CATHETER;  Surgeon: Brian Liang-Yu Chen, MD;  Location: [facility] OR;  Service: Vascular;  Laterality: N/A;  . Cardiac catheterization    . Av fistula placement  12/02/2011    Procedure: ARTERIOVENOUS (AV) FISTULA CREATION;  Surgeon: Brian L Chen, MD;  Location: [facility] OR;  Service: Vascular;  Laterality: Left;  Ultrasound Guided  . Subclavian stent placement  12/20/2011  . Coronary artery bypass graft  04/01/2011    Procedure: CORONARY ARTERY BYPASS GRAFTING (CABG);  Surgeon: Peter Van Trigt III, MD;  Location: [facility] OR;  Service: Open Heart Surgery;  Laterality: N/A;  . Coronary angioplasty      06/30/11   AT [facility]  . Colonoscopy    . Ligation of arteriovenous  fistula Left 06/08/2012    Procedure: LIGATION OF ARTERIOVENOUS  FISTULA;  Surgeon: Brian L Chen, MD;  Location: [facility] OR;  Service: Vascular;  Laterality: Left;  . Av fistula placement Left 06/08/2012    Procedure: ARTERIOVENOUS (AV) FISTULA CREATION;  Surgeon: Brian L Chen, MD;  Location: [facility] OR;  Service: Vascular;  Laterality: Left;  . Left heart catheterization with coronary angiogram N/A 03/28/2011    Procedure: LEFT HEART CATHETERIZATION WITH CORONARY ANGIOGRAM;  Surgeon: Thomas A   Kelly, MD;  Location: [facility] CATH LAB;  Service: Cardiovascular;  Laterality: N/A;  pending creatnine  . Left heart catheterization with coronary/graft angiogram N/A 06/30/2011    Procedure: LEFT HEART CATHETERIZATION WITH CORONARY/GRAFT ANGIOGRAM;  Surgeon: Mihai Croitoru, MD;  Location: [facility] CATH LAB;  Service: Cardiovascular;  Laterality: N/A;  . Percutaneous coronary intervention-balloon only  06/30/2011    Procedure: PERCUTANEOUS CORONARY INTERVENTION-BALLOON ONLY;  Surgeon: Mihai Croitoru, MD;  Location: [facility] CATH LAB;  Service: Cardiovascular;;  . Percutaneous coronary stent intervention (pci-s) N/A 07/04/2011    Procedure: PERCUTANEOUS CORONARY STENT INTERVENTION (PCI-S);  Surgeon: Thomas A Kelly, MD;  Location: [facility]  CATH LAB;  Service: Cardiovascular;  Laterality: N/A;  . Arch aortogram N/A 02/19/2012    Procedure: ARCH AORTOGRAM;  Surgeon: Brian L Chen, MD;  Location: [facility] CATH LAB;  Service: Cardiovascular;  Laterality: N/A;  . Hernia repair  2013  . Bascilic vein transposition Left 09/06/2014    Procedure: FIRST STAGE BASILIC VEIN TRANSPOSITION ;  Surgeon: Brian L Chen, MD;  Location: [facility] OR;  Service: Vascular;  Laterality: Left;  . Nm myocar perf ejection fraction  06/25/2011    There is evidence of mild ischemia in the basal inferolateral and mid inferolateral regions. (Extent 7 %) The post-stress ejection fraction is 44%. There is mild hypocontractility in the distal inferoapical segment. baseline T wave inversion is noted in leads I, avL, II, V2. This is a low risk scan. The present perfusion study suggests significant myocardial salvage with mild residual ischemia .    Social History   Social History  . Marital Status: Divorced    Spouse Name: N/A  . Number of Children: N/A  . Years of Education: N/A   Occupational History  . Not on file.   Social History Main Topics  . Smoking status: Never Smoker   . Smokeless tobacco: Former User    Types: Chew    Quit date: 03/18/2003     Comment: chewed tobacco   . Alcohol Use: 1.2 oz/week    0 Standard drinks or equivalent, 2 Shots of liquor per week     Comment: rarely; beer  . Drug Use: No  . Sexual Activity:    Partners: Male   Other Topics Concern  . Not on file   Social History Narrative    Family History  Problem Relation Age of Onset  . Hypertension Other   . Stroke Other   . Hyperlipidemia Other   . Cancer Mother   . Heart disease Mother   . Hyperlipidemia Mother   . Hypertension Mother   . Cancer Father   . Heart disease Father     Current Outpatient Prescriptions  Medication Sig Dispense Refill  . acetaminophen (TYLENOL) 500 MG tablet Take 1,000 mg by mouth every 6 (six) hours as needed (pain).    . aspirin EC 81 MG EC  tablet Take 1 tablet (81 mg total) by mouth daily.    . cetirizine (ZYRTEC) 10 MG tablet Take 10 mg by mouth daily as needed for allergies.     . ciclopirox (PENLAC) 8 % solution Apply topically at bedtime. Apply over nail and surrounding skin. Apply daily over previous coat. After seven (7) days, may remove with alcohol and continue cycle. 6.6 mL 2  . cinacalcet (SENSIPAR) 30 MG tablet Take 30 mg by mouth at bedtime.    . cloNIDine (CATAPRES) 0.2 MG tablet Take 1 tablet (0.2 mg total) by mouth 2 (two) times daily. 60 tablet 11  .   clopidogrel (PLAVIX) 75 MG tablet Take 1 tablet (75 mg total) by mouth at bedtime. 90 tablet 0  . febuxostat (ULORIC) 40 MG tablet Take 40 mg by mouth daily.     . GREEN TEA, CAMILLIA SINENSIS, PO Take 1 capsule by mouth daily.     . hydrALAZINE (APRESOLINE) 10 MG tablet Take 10 mg by mouth 2 (two) times daily.    . isosorbide mononitrate (IMDUR) 30 MG 24 hr tablet Take 1 tablet (30 mg total) by mouth daily. 30 tablet 6  . KRILL OIL PO Take 1 capsule by mouth 2 (two) times daily.     . lanthanum ([name]) 1000 MG chewable tablet Chew 2,000 mg by mouth 3 (three) times daily with meals.    . metoprolol (LOPRESSOR) 50 MG tablet Take 1 tablet (50 mg total) by mouth 2 (two) times daily. 180 tablet 3  . multivitamin (RENA-VIT) TABS tablet Take 1 tablet by mouth at bedtime.     . NITROSTAT 0.4 MG SL tablet PLACE 1 TABLET UNDER TONGUE AS NEEDED FOR CHEST PAIN EVERY 5 MINUTES (MAX 3DOSES) 25 tablet 4  . ranolazine (RANEXA) 500 MG 12 hr tablet Take 1 tablet (500 mg total) by mouth daily. 30 tablet 0  . rosuvastatin (CRESTOR) 20 MG tablet Take 1 tablet (20 mg total) by mouth every evening. 90 tablet 3  . terazosin (HYTRIN) 10 MG capsule TAKE 1 CAPSULE BY MOUTH AT BEDTIME. 90 capsule 2  . ticagrelor (BRILINTA) 90 MG TABS tablet Take 1 tablet (90 mg total) by mouth daily. 32 tablet 0  . zolpidem (AMBIEN) 10 MG tablet Take 1 tablet (10 mg total) by mouth at bedtime as needed for  sleep. 30 tablet 5   No current facility-administered medications for this visit.     Allergies  Allergen Reactions  . Shrimp [Shellfish Allergy] Shortness Of Breath  . Insulins Other (See Comments)    Patient unsure as to the kind of insulin but states that it has previously caused seizures. This occurred in the hospital in 2006; but in most recent hospitalization (Jan 2013) received insulin but did not have reaction  . Atorvastatin Other (See Comments)    weakness  . Simvastatin Other (See Comments)     abnormal liver tests  . Ultram [Tramadol] Other (See Comments)    Liver enzyme      REVIEW OF SYSTEMS:  (Positives checked otherwise negative)  CARDIOVASCULAR:   [ ] chest pain,  [ ] chest pressure,  [ ] palpitations,  [ ] shortness of breath when laying flat,  [ ] shortness of breath with exertion,   [ ] pain in feet when walking,  [ ] pain in feet when laying flat, [ ] history of blood clot in veins (DVT),  [ ] history of phlebitis,  [ ] swelling in legs,  [ ] varicose veins  PULMONARY:   [ ] productive cough,  [ ] asthma,  [ ] wheezing  NEUROLOGIC:   [ ] weakness in arms or legs,  [ ] numbness in arms or legs,  [ ] difficulty speaking or slurred speech,  [ ] temporary loss of vision in one eye,  [ ] dizziness  HEMATOLOGIC:   [ ] bleeding problems,  [ ] problems with blood clotting too easily  MUSCULOSKEL:   [ ] joint pain, [ ] joint swelling  GASTROINTEST:   [ ] vomiting blood,  [ ] blood in stool     GENITOURINARY:   [ ]   burning with urination,  [ ] blood in urine [x] ESRD-HD: T-R-S  PSYCHIATRIC:   [ ] history of major depression  INTEGUMENTARY:   [ ] rashes,  [ ] ulcers  CONSTITUTIONAL:   [ ] fever,  [ ] chills   For VQI Use Only  PRE-ADM LIVING: Home  AMB STATUS: Ambulatory   Physical Examination Filed Vitals:   11/24/14 1022 11/24/14 1028  BP: 146/103 126/91  Pulse: 88 82  Temp: 98.4 F (36.9 C)   TempSrc: Oral   Resp:  16   Height: 6' (1.829 m)   Weight: 224 lb (101.606 kg)   SpO2: 99%    Pulmonary: Sym exp, good air movt, CTAB, no rales, rhonchi, & wheezing  Cardiac: RRR, Nl S1, S2, no Murmurs, rubs or gallops  LUE: Incision is healed, skin feels warm, hand grip is 5/5, sensation in digits is intact, palpable thrill, bruit can be auscultated , on Sonosite: >7 mm in diameter  Medical Decision Making  [name] is a 54 y.o. (04/02/1960) male who presents s/p L 1st BRVT (non-mature)  The patient is ready for the 2nd stage, i.e. Transposition.  This is scheduled for 21 SEP 16.  Risk, benefits, and alternatives to access surgery were discussed.  The patient is aware the risks include but are not limited to: bleeding, infection, steal syndrome, nerve damage, ischemic monomelic neuropathy, failure to mature, need for additional procedures, death and stroke.    The patient agrees to proceed forward with the procedure.  Thank you for allowing us to participate in this patient's care.  Brian Chen, MD [facility]  Office: [phone] Pager: [phone]  11/24/2014, 10:53 AM   

## 2014-11-25 DIAGNOSIS — N2581 Secondary hyperparathyroidism of renal origin: Secondary | ICD-10-CM | POA: Diagnosis not present

## 2014-11-25 DIAGNOSIS — N186 End stage renal disease: Secondary | ICD-10-CM | POA: Diagnosis not present

## 2014-11-25 DIAGNOSIS — D509 Iron deficiency anemia, unspecified: Secondary | ICD-10-CM | POA: Diagnosis not present

## 2014-11-25 DIAGNOSIS — Z23 Encounter for immunization: Secondary | ICD-10-CM | POA: Diagnosis not present

## 2014-11-25 DIAGNOSIS — D631 Anemia in chronic kidney disease: Secondary | ICD-10-CM | POA: Diagnosis not present

## 2014-11-27 ENCOUNTER — Other Ambulatory Visit: Payer: Self-pay

## 2014-11-27 ENCOUNTER — Encounter: Payer: Self-pay | Admitting: Podiatry

## 2014-11-27 ENCOUNTER — Ambulatory Visit (INDEPENDENT_AMBULATORY_CARE_PROVIDER_SITE_OTHER): Payer: Medicare Other | Admitting: Podiatry

## 2014-11-27 VITALS — BP 106/65 | HR 74 | Resp 16

## 2014-11-27 DIAGNOSIS — B351 Tinea unguium: Secondary | ICD-10-CM

## 2014-11-27 DIAGNOSIS — M79676 Pain in unspecified toe(s): Secondary | ICD-10-CM

## 2014-11-27 NOTE — Progress Notes (Signed)
Patient ID: [name], male   DOB: [birth year], 54 y.o.   MRN: [id number]  Subjective: 54 y.o. returns the office today for painful, elongated, thickened toenails which he is unable to trim himself. Denies any redness or drainage around the nails. He was able to get the Penlac due to cost. Denies any acute changes since last appointment and no new complaints today. Denies any systemic complaints such as fevers, chills, nausea, vomiting.   Objective: AAO 3, NAD DP/PT pulses palpable, CRT less than 3 seconds Protective sensation intact with Simms Weinstein monofilament Nails hypertrophic, dystrophic, elongated, brittle, discolored 10. There is tenderness overlying the nails 1-5 bilaterally. There is no surrounding erythema or drainage along the nail sites. No open lesions or pre-ulcerative lesions are identified. Dry skin is present bilaterally without any fissuring or open lesions. No other areas of tenderness bilateral lower extremities. No overlying edema, erythema, increased warmth. No pain with calf compression, swelling, warmth, erythema.  Assessment: Patient presents with symptomatic onychomycosis  Plan: -Treatment options including alternatives, risks, complications were discussed -Nails sharply debrided 10 without complication/bleeding. -Moisturizer daily; not between toes. -Discussed over-the-counter treatment options for onychomycosis. I discussed with him Fungi-Nail. -Discussed daily foot inspection. If there are any changes, to call the office immediately.  -Follow-up in 3 months or sooner if any problems are to arise. In the meantime, encouraged to call the office with any questions, concerns, changes symptoms.  Ovid Curd, DPM

## 2014-11-27 NOTE — Patient Instructions (Signed)
Over the counter medication for nail fungus is called fungi-nail 

## 2014-11-28 ENCOUNTER — Telehealth: Payer: Self-pay

## 2014-11-28 DIAGNOSIS — Z23 Encounter for immunization: Secondary | ICD-10-CM | POA: Diagnosis not present

## 2014-11-28 DIAGNOSIS — D631 Anemia in chronic kidney disease: Secondary | ICD-10-CM | POA: Diagnosis not present

## 2014-11-28 DIAGNOSIS — N186 End stage renal disease: Secondary | ICD-10-CM | POA: Diagnosis not present

## 2014-11-28 DIAGNOSIS — D509 Iron deficiency anemia, unspecified: Secondary | ICD-10-CM | POA: Diagnosis not present

## 2014-11-28 DIAGNOSIS — N2581 Secondary hyperparathyroidism of renal origin: Secondary | ICD-10-CM | POA: Diagnosis not present

## 2014-11-28 NOTE — Telephone Encounter (Signed)
-----   Message from Runell Gess, MD sent at 11/27/2014  6:04 PM EDT ----- He should not be on Plavix and Brilenta. Can stop the Brilneta (Stent was 4/13) and interrupt the plavix for the Vasc surg procedure.  Allyson Sabal ----- Message -----    From: Glori Bickers, RN    Sent: 11/27/2014   2:14 PM      To: Marella Bile, RN, Runell Gess, MD  Mr. Breining is scheduled for (L) arm 2nd stage BRVT by Dr. Imogene Burn next Wednesday, 12/06/14. Patient is on ASA 81mg , Plavix and Brilinta. Can we stop both the Plavix and Brilinta 5 days prior to procedure? Thanks.  Charlsie Merles, RN

## 2014-11-30 DIAGNOSIS — N186 End stage renal disease: Secondary | ICD-10-CM | POA: Diagnosis not present

## 2014-11-30 DIAGNOSIS — N2581 Secondary hyperparathyroidism of renal origin: Secondary | ICD-10-CM | POA: Diagnosis not present

## 2014-11-30 DIAGNOSIS — D631 Anemia in chronic kidney disease: Secondary | ICD-10-CM | POA: Diagnosis not present

## 2014-11-30 DIAGNOSIS — D509 Iron deficiency anemia, unspecified: Secondary | ICD-10-CM | POA: Diagnosis not present

## 2014-11-30 DIAGNOSIS — Z23 Encounter for immunization: Secondary | ICD-10-CM | POA: Diagnosis not present

## 2014-12-02 DIAGNOSIS — N2581 Secondary hyperparathyroidism of renal origin: Secondary | ICD-10-CM | POA: Diagnosis not present

## 2014-12-02 DIAGNOSIS — Z23 Encounter for immunization: Secondary | ICD-10-CM | POA: Diagnosis not present

## 2014-12-02 DIAGNOSIS — D509 Iron deficiency anemia, unspecified: Secondary | ICD-10-CM | POA: Diagnosis not present

## 2014-12-02 DIAGNOSIS — D631 Anemia in chronic kidney disease: Secondary | ICD-10-CM | POA: Diagnosis not present

## 2014-12-02 DIAGNOSIS — N186 End stage renal disease: Secondary | ICD-10-CM | POA: Diagnosis not present

## 2014-12-05 ENCOUNTER — Encounter (HOSPITAL_COMMUNITY): Payer: Self-pay | Admitting: *Deleted

## 2014-12-05 DIAGNOSIS — D631 Anemia in chronic kidney disease: Secondary | ICD-10-CM | POA: Diagnosis not present

## 2014-12-05 DIAGNOSIS — N2581 Secondary hyperparathyroidism of renal origin: Secondary | ICD-10-CM | POA: Diagnosis not present

## 2014-12-05 DIAGNOSIS — N186 End stage renal disease: Secondary | ICD-10-CM | POA: Diagnosis not present

## 2014-12-05 DIAGNOSIS — Z23 Encounter for immunization: Secondary | ICD-10-CM | POA: Diagnosis not present

## 2014-12-05 DIAGNOSIS — D509 Iron deficiency anemia, unspecified: Secondary | ICD-10-CM | POA: Diagnosis not present

## 2014-12-05 MED ORDER — DEXTROSE 5 % IV SOLN
1.5000 g | INTRAVENOUS | Status: AC
  Administered 2014-12-06: 1.5 g via INTRAVENOUS
  Filled 2014-12-05: qty 1.5

## 2014-12-05 NOTE — Progress Notes (Signed)
Pt denies any recent chest pain or sob. States he ran out of his Clonidine about 2 weeks ago. States that BP is doing "ok".

## 2014-12-06 ENCOUNTER — Encounter (HOSPITAL_COMMUNITY)
Admission: RE | Disposition: A | Discharge status: Home / Self Care | Payer: Medicare Other | Source: Ambulatory Visit | Attending: Vascular Surgery

## 2014-12-06 ENCOUNTER — Observation Stay (HOSPITAL_COMMUNITY)
Admission: RE | Admit: 2014-12-06 | Discharge: 2014-12-07 | Disposition: A | Discharge status: Home / Self Care | Payer: Medicare Other | Source: Ambulatory Visit | Attending: Vascular Surgery | Admitting: Vascular Surgery

## 2014-12-06 ENCOUNTER — Ambulatory Visit (HOSPITAL_COMMUNITY): Payer: Medicare Other | Admitting: Anesthesiology

## 2014-12-06 ENCOUNTER — Encounter (HOSPITAL_COMMUNITY): Payer: Self-pay | Admitting: General Practice

## 2014-12-06 DIAGNOSIS — N185 Chronic kidney disease, stage 5: Secondary | ICD-10-CM | POA: Diagnosis not present

## 2014-12-06 DIAGNOSIS — Z992 Dependence on renal dialysis: Secondary | ICD-10-CM | POA: Insufficient documentation

## 2014-12-06 DIAGNOSIS — Z6830 Body mass index (BMI) 30.0-30.9, adult: Secondary | ICD-10-CM | POA: Diagnosis not present

## 2014-12-06 DIAGNOSIS — I252 Old myocardial infarction: Secondary | ICD-10-CM | POA: Insufficient documentation

## 2014-12-06 DIAGNOSIS — Z7982 Long term (current) use of aspirin: Secondary | ICD-10-CM | POA: Insufficient documentation

## 2014-12-06 DIAGNOSIS — K219 Gastro-esophageal reflux disease without esophagitis: Secondary | ICD-10-CM | POA: Diagnosis not present

## 2014-12-06 DIAGNOSIS — I120 Hypertensive chronic kidney disease with stage 5 chronic kidney disease or end stage renal disease: Principal | ICD-10-CM | POA: Insufficient documentation

## 2014-12-06 DIAGNOSIS — Z8673 Personal history of transient ischemic attack (TIA), and cerebral infarction without residual deficits: Secondary | ICD-10-CM | POA: Diagnosis not present

## 2014-12-06 DIAGNOSIS — E785 Hyperlipidemia, unspecified: Secondary | ICD-10-CM | POA: Insufficient documentation

## 2014-12-06 DIAGNOSIS — N186 End stage renal disease: Secondary | ICD-10-CM

## 2014-12-06 DIAGNOSIS — Z7902 Long term (current) use of antithrombotics/antiplatelets: Secondary | ICD-10-CM | POA: Diagnosis not present

## 2014-12-06 DIAGNOSIS — Z951 Presence of aortocoronary bypass graft: Secondary | ICD-10-CM | POA: Insufficient documentation

## 2014-12-06 HISTORY — PX: REVISION OF ARTERIOVENOUS GORETEX GRAFT: SHX6073

## 2014-12-06 HISTORY — PX: BASCILIC VEIN TRANSPOSITION: SHX5742

## 2014-12-06 LAB — POCT I-STAT 4, (NA,K, GLUC, HGB,HCT)
Glucose, Bld: 104 mg/dL — ABNORMAL HIGH (ref 65–99)
HCT: 31 % — ABNORMAL LOW (ref 39.0–52.0)
Hemoglobin: 10.5 g/dL — ABNORMAL LOW (ref 13.0–17.0)
Potassium: 3.9 mmol/L (ref 3.5–5.1)
Sodium: 137 mmol/L (ref 135–145)

## 2014-12-06 SURGERY — TRANSPOSITION, VEIN, BASILIC
Anesthesia: General | Site: Arm Upper | Laterality: Left

## 2014-12-06 MED ORDER — 0.9 % SODIUM CHLORIDE (POUR BTL) OPTIME
TOPICAL | Status: DC | PRN
  Administered 2014-12-06: 1000 mL

## 2014-12-06 MED ORDER — MIDAZOLAM HCL 5 MG/5ML IJ SOLN
INTRAMUSCULAR | Status: DC | PRN
  Administered 2014-12-06: 2 mg via INTRAVENOUS

## 2014-12-06 MED ORDER — ONDANSETRON HCL 4 MG/2ML IJ SOLN
INTRAMUSCULAR | Status: DC | PRN
  Administered 2014-12-06: 4 mg via INTRAVENOUS

## 2014-12-06 MED ORDER — THROMBIN 20000 UNITS EX SOLR
CUTANEOUS | Status: DC | PRN
  Administered 2014-12-06: 11:00:00 via TOPICAL
  Administered 2014-12-06: 20 mL via TOPICAL
  Administered 2014-12-06: 11:00:00 via TOPICAL

## 2014-12-06 MED ORDER — LORATADINE 10 MG PO TABS
10.0000 mg | ORAL_TABLET | Freq: Every day | ORAL | Status: DC
  Administered 2014-12-07: 10 mg via ORAL
  Filled 2014-12-06 (×2): qty 1

## 2014-12-06 MED ORDER — OXYCODONE HCL 5 MG PO TABS
5.0000 mg | ORAL_TABLET | Freq: Once | ORAL | Status: AC
  Administered 2014-12-06: 5 mg via ORAL

## 2014-12-06 MED ORDER — SODIUM CHLORIDE 0.9 % IV SOLN: 500.0000 mL | Freq: Once | INTRAVENOUS | Status: DC | PRN

## 2014-12-06 MED ORDER — HYDRALAZINE HCL 20 MG/ML IJ SOLN: 5.0000 mg | INTRAMUSCULAR | Status: DC | PRN

## 2014-12-06 MED ORDER — LIDOCAINE HCL (CARDIAC) 20 MG/ML IV SOLN
INTRAVENOUS | Status: DC | PRN
  Administered 2014-12-06: 20 mg via INTRAVENOUS

## 2014-12-06 MED ORDER — MIDAZOLAM HCL 2 MG/2ML IJ SOLN: 0.5000 mg | Freq: Once | INTRAMUSCULAR | Status: DC | PRN

## 2014-12-06 MED ORDER — MIDAZOLAM HCL 2 MG/2ML IJ SOLN
INTRAMUSCULAR | Status: AC
  Filled 2014-12-06: qty 4

## 2014-12-06 MED ORDER — OXYCODONE HCL 5 MG PO TABS: 5.0000 mg | ORAL_TABLET | Freq: Four times a day (QID) | ORAL | Status: DC | PRN

## 2014-12-06 MED ORDER — FEBUXOSTAT 40 MG PO TABS
40.0000 mg | ORAL_TABLET | Freq: Every day | ORAL | Status: DC
  Administered 2014-12-06 – 2014-12-07 (×2): 40 mg via ORAL
  Filled 2014-12-06 (×2): qty 1

## 2014-12-06 MED ORDER — ONDANSETRON HCL 4 MG/2ML IJ SOLN
4.0000 mg | Freq: Four times a day (QID) | INTRAMUSCULAR | Status: DC | PRN
  Filled 2014-12-06: qty 2

## 2014-12-06 MED ORDER — HYDRALAZINE HCL 10 MG PO TABS
10.0000 mg | ORAL_TABLET | Freq: Two times a day (BID) | ORAL | Status: DC
  Administered 2014-12-06: 10 mg via ORAL
  Filled 2014-12-06: qty 1

## 2014-12-06 MED ORDER — PROTAMINE SULFATE 10 MG/ML IV SOLN
INTRAVENOUS | Status: DC | PRN
  Administered 2014-12-06: 20 mg via INTRAVENOUS
  Administered 2014-12-06 (×2): 30 mg via INTRAVENOUS

## 2014-12-06 MED ORDER — ZOLPIDEM TARTRATE 5 MG PO TABS: 10.0000 mg | ORAL_TABLET | Freq: Every evening | ORAL | Status: DC | PRN

## 2014-12-06 MED ORDER — THROMBIN 20000 UNITS EX SOLR
CUTANEOUS | Status: AC
  Filled 2014-12-06: qty 20000

## 2014-12-06 MED ORDER — SODIUM CHLORIDE 0.9 % IV SOLN
INTRAVENOUS | Status: DC
Start: 2014-12-06 — End: 2014-12-06
  Administered 2014-12-06 (×2): via INTRAVENOUS

## 2014-12-06 MED ORDER — POTASSIUM CHLORIDE CRYS ER 20 MEQ PO TBCR: 20.0000 meq | EXTENDED_RELEASE_TABLET | Freq: Every day | ORAL | Status: DC | PRN

## 2014-12-06 MED ORDER — EPHEDRINE SULFATE 50 MG/ML IJ SOLN
INTRAMUSCULAR | Status: DC | PRN
  Administered 2014-12-06 (×5): 5 mg via INTRAVENOUS

## 2014-12-06 MED ORDER — OXYCODONE-ACETAMINOPHEN 5-325 MG PO TABS
ORAL_TABLET | ORAL | Status: AC
  Filled 2014-12-06: qty 2

## 2014-12-06 MED ORDER — HEPARIN SODIUM (PORCINE) 1000 UNIT/ML IJ SOLN
INTRAMUSCULAR | Status: DC | PRN
  Administered 2014-12-06: 10 mL via INTRAVENOUS

## 2014-12-06 MED ORDER — FENTANYL CITRATE (PF) 100 MCG/2ML IJ SOLN
INTRAMUSCULAR | Status: DC | PRN
  Administered 2014-12-06 (×2): 50 ug via INTRAVENOUS
  Administered 2014-12-06: 25 ug via INTRAVENOUS
  Administered 2014-12-06 (×2): 50 ug via INTRAVENOUS
  Administered 2014-12-06: 25 ug via INTRAVENOUS
  Administered 2014-12-06: 50 ug via INTRAVENOUS
  Administered 2014-12-06: 25 ug via INTRAVENOUS
  Administered 2014-12-06: 50 ug via INTRAVENOUS
  Administered 2014-12-06: 25 ug via INTRAVENOUS
  Administered 2014-12-06: 50 ug via INTRAVENOUS
  Administered 2014-12-06: 25 ug via INTRAVENOUS

## 2014-12-06 MED ORDER — HEPARIN SODIUM (PORCINE) 5000 UNIT/ML IJ SOLN
5000.0000 [IU] | Freq: Three times a day (TID) | INTRAMUSCULAR | Status: DC
  Administered 2014-12-07: 5000 [IU] via SUBCUTANEOUS
  Filled 2014-12-06: qty 1

## 2014-12-06 MED ORDER — CINACALCET HCL 30 MG PO TABS
30.0000 mg | ORAL_TABLET | Freq: Every day | ORAL | Status: DC
  Administered 2014-12-06: 30 mg via ORAL
  Filled 2014-12-06: qty 1

## 2014-12-06 MED ORDER — ACETAMINOPHEN 650 MG RE SUPP
325.0000 mg | RECTAL | Status: DC | PRN
Start: 2014-12-06 — End: 2014-12-07

## 2014-12-06 MED ORDER — ROCURONIUM BROMIDE 50 MG/5ML IV SOLN
INTRAVENOUS | Status: AC
  Filled 2014-12-06: qty 1

## 2014-12-06 MED ORDER — OXYCODONE HCL 5 MG PO TABS
ORAL_TABLET | ORAL | Status: AC
  Filled 2014-12-06: qty 1

## 2014-12-06 MED ORDER — MORPHINE SULFATE (PF) 2 MG/ML IV SOLN: 2.0000 mg | INTRAVENOUS | Status: DC | PRN

## 2014-12-06 MED ORDER — METOPROLOL TARTRATE 1 MG/ML IV SOLN: 2.0000 mg | INTRAVENOUS | Status: DC | PRN

## 2014-12-06 MED ORDER — DOCUSATE SODIUM 100 MG PO CAPS
100.0000 mg | ORAL_CAPSULE | Freq: Every day | ORAL | Status: DC
  Administered 2014-12-07: 100 mg via ORAL
  Filled 2014-12-06: qty 1

## 2014-12-06 MED ORDER — ISOSORBIDE MONONITRATE ER 30 MG PO TB24
30.0000 mg | ORAL_TABLET | Freq: Every day | ORAL | Status: DC
  Administered 2014-12-06: 30 mg via ORAL
  Filled 2014-12-06: qty 1

## 2014-12-06 MED ORDER — RENA-VITE PO TABS
1.0000 | ORAL_TABLET | Freq: Every day | ORAL | Status: DC
  Administered 2014-12-06: 1 via ORAL
  Filled 2014-12-06: qty 1

## 2014-12-06 MED ORDER — MEPERIDINE HCL 25 MG/ML IJ SOLN: 6.2500 mg | INTRAMUSCULAR | Status: DC | PRN

## 2014-12-06 MED ORDER — BUPIVACAINE HCL (PF) 0.5 % IJ SOLN
INTRAMUSCULAR | Status: AC
  Filled 2014-12-06: qty 30

## 2014-12-06 MED ORDER — METOPROLOL TARTRATE 50 MG PO TABS
50.0000 mg | ORAL_TABLET | Freq: Two times a day (BID) | ORAL | Status: DC
  Administered 2014-12-06: 50 mg via ORAL
  Filled 2014-12-06: qty 1

## 2014-12-06 MED ORDER — TERAZOSIN HCL 5 MG PO CAPS
10.0000 mg | ORAL_CAPSULE | Freq: Every day | ORAL | Status: DC
  Administered 2014-12-06: 10 mg via ORAL
  Filled 2014-12-06: qty 2

## 2014-12-06 MED ORDER — LIDOCAINE HCL (CARDIAC) 20 MG/ML IV SOLN
INTRAVENOUS | Status: AC
  Filled 2014-12-06: qty 5

## 2014-12-06 MED ORDER — BUPIVACAINE HCL 0.5 % IJ SOLN
INTRAMUSCULAR | Status: AC
  Filled 2014-12-06: qty 1

## 2014-12-06 MED ORDER — ONDANSETRON HCL 4 MG/2ML IJ SOLN
INTRAMUSCULAR | Status: AC
  Filled 2014-12-06: qty 2

## 2014-12-06 MED ORDER — CHLORHEXIDINE GLUCONATE CLOTH 2 % EX PADS: 6.0000 | MEDICATED_PAD | Freq: Once | CUTANEOUS | Status: DC

## 2014-12-06 MED ORDER — ONDANSETRON HCL 4 MG PO TABS
4.0000 mg | ORAL_TABLET | Freq: Four times a day (QID) | ORAL | Status: DC | PRN
  Administered 2014-12-06: 4 mg via ORAL
  Filled 2014-12-06: qty 1

## 2014-12-06 MED ORDER — ASPIRIN EC 81 MG PO TBEC
81.0000 mg | DELAYED_RELEASE_TABLET | Freq: Every day | ORAL | Status: DC
  Administered 2014-12-07: 81 mg via ORAL
  Filled 2014-12-06 (×2): qty 1

## 2014-12-06 MED ORDER — PANTOPRAZOLE SODIUM 40 MG PO TBEC
40.0000 mg | DELAYED_RELEASE_TABLET | Freq: Every day | ORAL | Status: DC
  Administered 2014-12-06 – 2014-12-07 (×2): 40 mg via ORAL
  Filled 2014-12-06 (×2): qty 1

## 2014-12-06 MED ORDER — FENTANYL CITRATE (PF) 250 MCG/5ML IJ SOLN
INTRAMUSCULAR | Status: AC
  Filled 2014-12-06: qty 5

## 2014-12-06 MED ORDER — GUAIFENESIN-DM 100-10 MG/5ML PO SYRP: 15.0000 mL | ORAL_SOLUTION | ORAL | Status: DC | PRN

## 2014-12-06 MED ORDER — ROSUVASTATIN CALCIUM 20 MG PO TABS
20.0000 mg | ORAL_TABLET | Freq: Every evening | ORAL | Status: DC
  Administered 2014-12-06: 20 mg via ORAL
  Filled 2014-12-06 (×2): qty 1

## 2014-12-06 MED ORDER — ACETAMINOPHEN 325 MG PO TABS: 325.0000 mg | ORAL_TABLET | ORAL | Status: DC | PRN

## 2014-12-06 MED ORDER — MAGNESIUM SULFATE 2 GM/50ML IV SOLN
2.0000 g | Freq: Every day | INTRAVENOUS | Status: DC | PRN
  Filled 2014-12-06: qty 50

## 2014-12-06 MED ORDER — METOPROLOL TARTRATE 50 MG PO TABS
50.0000 mg | ORAL_TABLET | Freq: Once | ORAL | Status: AC
  Administered 2014-12-06: 50 mg via ORAL
  Filled 2014-12-06: qty 1

## 2014-12-06 MED ORDER — FENTANYL CITRATE (PF) 100 MCG/2ML IJ SOLN: 25.0000 ug | INTRAMUSCULAR | Status: DC | PRN

## 2014-12-06 MED ORDER — RANOLAZINE ER 500 MG PO TB12
500.0000 mg | ORAL_TABLET | Freq: Every day | ORAL | Status: DC
  Administered 2014-12-06 – 2014-12-07 (×2): 500 mg via ORAL
  Filled 2014-12-06 (×2): qty 1

## 2014-12-06 MED ORDER — CLONIDINE HCL 0.2 MG PO TABS
0.2000 mg | ORAL_TABLET | Freq: Two times a day (BID) | ORAL | Status: DC
  Administered 2014-12-06: 0.2 mg via ORAL
  Filled 2014-12-06: qty 1

## 2014-12-06 MED ORDER — OXYCODONE-ACETAMINOPHEN 5-325 MG PO TABS
1.0000 | ORAL_TABLET | ORAL | Status: DC | PRN
  Administered 2014-12-06: 2 via ORAL

## 2014-12-06 MED ORDER — LABETALOL HCL 5 MG/ML IV SOLN: 10.0000 mg | INTRAVENOUS | Status: DC | PRN

## 2014-12-06 MED ORDER — ACETAMINOPHEN 500 MG PO TABS: 1000.0000 mg | ORAL_TABLET | Freq: Four times a day (QID) | ORAL | Status: DC | PRN

## 2014-12-06 MED ORDER — PROPOFOL 10 MG/ML IV BOLUS
INTRAVENOUS | Status: DC | PRN
  Administered 2014-12-06: 200 mg via INTRAVENOUS

## 2014-12-06 MED ORDER — CLOPIDOGREL BISULFATE 75 MG PO TABS: 75.0000 mg | ORAL_TABLET | Freq: Every day | ORAL | Status: DC

## 2014-12-06 MED ORDER — LANTHANUM CARBONATE 500 MG PO CHEW
2000.0000 mg | CHEWABLE_TABLET | Freq: Three times a day (TID) | ORAL | Status: DC
  Administered 2014-12-06 – 2014-12-07 (×3): 2000 mg via ORAL
  Filled 2014-12-06 (×4): qty 4

## 2014-12-06 MED ORDER — BUPIVACAINE HCL (PF) 0.5 % IJ SOLN
INTRAMUSCULAR | Status: DC | PRN
  Administered 2014-12-06: 30 mL

## 2014-12-06 MED ORDER — SODIUM CHLORIDE 0.9 % IV SOLN
INTRAVENOUS | Status: DC | PRN
  Administered 2014-12-06: 11:00:00

## 2014-12-06 MED ORDER — PHENOL 1.4 % MT LIQD: 1.0000 | OROMUCOSAL | Status: DC | PRN

## 2014-12-06 MED ORDER — ALUM & MAG HYDROXIDE-SIMETH 200-200-20 MG/5ML PO SUSP: 15.0000 mL | ORAL | Status: DC | PRN

## 2014-12-06 MED ORDER — PROMETHAZINE HCL 25 MG/ML IJ SOLN
6.2500 mg | INTRAMUSCULAR | Status: DC | PRN
Start: 2014-12-06 — End: 2014-12-06

## 2014-12-06 SURGICAL SUPPLY — 44 items
CANISTER SUCTION 2500CC (MISCELLANEOUS) ×3 IMPLANT
CLIP TI MEDIUM 24 (CLIP) ×3 IMPLANT
CLIP TI WIDE RED SMALL 24 (CLIP) ×3 IMPLANT
CORDS BIPOLAR (ELECTRODE) ×3 IMPLANT
COVER PROBE W GEL 5X96 (DRAPES) IMPLANT
DECANTER SPIKE VIAL GLASS SM (MISCELLANEOUS) ×3 IMPLANT
DRSG COVADERM 4X10 (GAUZE/BANDAGES/DRESSINGS) IMPLANT
DRSG COVADERM 4X8 (GAUZE/BANDAGES/DRESSINGS) IMPLANT
ELECT REM PT RETURN 9FT ADLT (ELECTROSURGICAL) ×3
ELECTRODE REM PT RTRN 9FT ADLT (ELECTROSURGICAL) ×1 IMPLANT
GLOVE BIO SURGEON STRL SZ 6.5 (GLOVE) ×4 IMPLANT
GLOVE BIO SURGEON STRL SZ7 (GLOVE) ×3 IMPLANT
GLOVE BIO SURGEONS STRL SZ 6.5 (GLOVE) ×2
GLOVE BIOGEL PI IND STRL 6.5 (GLOVE) ×1 IMPLANT
GLOVE BIOGEL PI IND STRL 7.5 (GLOVE) ×1 IMPLANT
GLOVE BIOGEL PI INDICATOR 6.5 (GLOVE) ×2
GLOVE BIOGEL PI INDICATOR 7.5 (GLOVE) ×2
GLOVE ECLIPSE 6.5 STRL STRAW (GLOVE) ×3 IMPLANT
GOWN STRL REUS W/ TWL LRG LVL3 (GOWN DISPOSABLE) ×3 IMPLANT
GOWN STRL REUS W/TWL LRG LVL3 (GOWN DISPOSABLE) ×6
GRAFT GORETEX 6X10 (Vascular Products) ×3 IMPLANT
KIT BASIN OR (CUSTOM PROCEDURE TRAY) ×3 IMPLANT
KIT ROOM TURNOVER OR (KITS) ×3 IMPLANT
LIQUID BAND (GAUZE/BANDAGES/DRESSINGS) ×3 IMPLANT
NS IRRIG 1000ML POUR BTL (IV SOLUTION) ×3 IMPLANT
PACK CV ACCESS (CUSTOM PROCEDURE TRAY) ×3 IMPLANT
PAD ARMBOARD 7.5X6 YLW CONV (MISCELLANEOUS) ×6 IMPLANT
SPONGE SURGIFOAM ABS GEL 100 (HEMOSTASIS) ×9 IMPLANT
STAPLER VISISTAT 35W (STAPLE) IMPLANT
SUT GORETEX 6.0 TT13 (SUTURE) ×15 IMPLANT
SUT MNCRL AB 4-0 PS2 18 (SUTURE) ×6 IMPLANT
SUT PROLENE 6 0 BV (SUTURE) ×3 IMPLANT
SUT PROLENE 7 0 BV 1 (SUTURE) ×3 IMPLANT
SUT SILK 2 0 SH (SUTURE) ×3 IMPLANT
SUT SILK 3 0 (SUTURE) ×2
SUT SILK 3-0 18XBRD TIE 12 (SUTURE) ×1 IMPLANT
SUT SILK 4 0 (SUTURE) ×4
SUT SILK 4-0 18XBRD TIE 12 (SUTURE) ×2 IMPLANT
SUT VIC AB 2-0 CT1 27 (SUTURE) ×2
SUT VIC AB 2-0 CT1 TAPERPNT 27 (SUTURE) ×1 IMPLANT
SUT VIC AB 3-0 SH 27 (SUTURE) ×10
SUT VIC AB 3-0 SH 27X BRD (SUTURE) ×5 IMPLANT
UNDERPAD 30X30 INCONTINENT (UNDERPADS AND DIAPERS) ×3 IMPLANT
WATER STERILE IRR 1000ML POUR (IV SOLUTION) ×3 IMPLANT

## 2014-12-06 NOTE — Interval H&P Note (Signed)
History and Physical Interval Note:  12/06/2014 8:10 AM  [name]  has presented today for surgery, with the diagnosis of End Stage Renal Disease N18.6  The various methods of treatment have been discussed with the patient and family. After consideration of risks, benefits and other options for treatment, the patient has consented to  Procedure(s): SECOND STAGE BRACHIAL VEIN TRANSPOSITION (Left) as a surgical intervention .  The patient's history has been reviewed, patient examined, no change in status, stable for surgery.  I have reviewed the patient's chart and labs.  Questions were answered to the patient's satisfaction.     Leonides Sake

## 2014-12-06 NOTE — Op Note (Signed)
OPERATIVE NOTE   PROCEDURE: left second stage brachial vein transposition (brachiobrachial arteriovenous fistula) with revision with interposition graft  PRE-OPERATIVE DIAGNOSIS: end stage renal disease, s/p successful first stage brachial vein transposition   POST-OPERATIVE DIAGNOSIS: same as above   SURGEON: Leonides Sake, MD  ASSISTANT(S): Doreatha Massed, PAC; Karsten Ro, PAC   ANESTHESIA: general  ESTIMATED BLOOD LOSS: 100 cc  FINDING(S): 1.  Fistula: >6 mm in proximal half, non-responsive stenosis distally compromising distal 1/3 of fistula 2.  Poor brachial pulse intraoperatively 3.  Faint thrill in fistula at end of case  SPECIMEN(S):  none  INDICATIONS:   [name] is a 54 y.o. male who presents with end stage renal disease.  The patient is scheduled for left second stage brachial vein transposition.  The patient is aware the risks include but are not limited to: bleeding, infection, steal syndrome, nerve damage, ischemic monomelic neuropathy, failure to mature, and need for additional procedures.  The patient is aware of the risks of the procedure and elects to proceed forward.  DESCRIPTION: After full informed written consent was obtained from the patient, the patient was brought back to the operating room and placed supine upon the operating table.  Prior to induction, the patient received IV antibiotics.   After obtaining adequate anesthesia, the patient was then prepped and draped in the standard fashion for a left arm access procedure.  I turned my attention first to identifying the patient's brachiobrachial arteriovenous fistula.  Using SonoSite guidance, the location of this fistula was marked out on the skin.  I made an longitudinal incision over the fistula from its arterial anastomosis up to its axillary extent.  I carefully dissected the fistula away from its adjacent nerves, ligating side branches in the process.  Eventually the entirety of this fistula was  mobilized.  The distal fistula appeared to be sclerotic and diseased.  I dissected out the fistula to just adjacent to the anastomosis.  I tied off the fistula with 2-0 silk.  I transected the fistula.  I tried to pass a 5 mm dilator in the sclerotic segment but it would not advance.  I elected to sew a 6 mm Goretex graft to this residual fistula.  The patient was given 1000 unit of Heparin intravenously, which was a therapeutic bolus.  I spatulated the graft and the distal end of the fistula.  I sewed the fistula to the graft with a running stitch of CV-6 in an end-to-end fashion.  I injected heparinized saline and there was extensive leaks from the anastomosis, so I felt I could not tunnel this fistula/graft.  At this point, I dissected out the distal brachial artery and clamped it proximally and distally.  I spatulated the end of the graft, adjusting the length in the process.  I made an arteriotomy and extended it proximally and distally.  I sewed the graft to the arteriotomy with a running stitch of CV-6.  I released the clamps.  There was some needle holes present so I gave the patient a total of 80 mg of Protamine and placed thrombin and gelfoam around the two anastomoses.  After waiting a few minutes, I removed the gelfoam and inspected each anastomosis.  A repair stitch was placed in each anastomosis.  This appeared to stop the bleeding.    At this point, I dissected a plane superficial to the bicipital fascia.  The fistula/graft was secured in this new location with interrupted 3-0 Vicryl stitches tied from side  branches to the underlying fascia.  The deep subcutaneous tissue was inspected for bleeding.  Bleeding was controlled with electrocautery and placement of large pieces of thrombin and gelfoam.  I washed out the surgical site after waiting a few minutes, and there was no further bleeding.  I reapproximated the fascia with horizontal mattress stitches of 2-0 Vicryl.  The deep subcutaneous tissue  was reapproximated with mattress sutures of 3-0 Vicryl to eliminate some of the dead space.  I injected 30 cc of 0.5% Marcaine without epineprine along this incision.  The superficial subcutaneous tissue was then reapproximated along the incision line with a running stitch of 3-0 Vicryl.  The skin was then reapproximated with a running subcuticular of 4-0 Monocryl.  The skin was then cleaned, dried, and reinforced with Dermabond.  The patient tolerated this procedure well.  At this end of the case, I could feel a weak thrill.  There was a weak pulse in the brachial artery, so I suspect this is due to relative hypotension due to anesthesia.     COMPLICATIONS: none  CONDITION: stable   Leonides Sake, MD [facility] Office: [phone] Pager: [phone]  12/06/2014, 2:13 PM

## 2014-12-06 NOTE — H&P (View-Only) (Signed)
Postoperative Access Visit   History of Present Illness  [name] is a 54 y.o. ([birth year]) male  who presents for postoperative follow-up for: L 1st stage BRVT (Date: 09/06/14). The patient's wounds are healed. The patient notes no steal symptoms. The patient is able to complete their activities of daily living. The patient's current symptoms are: none.  Past Medical History  Diagnosis Date  . ACHALASIA   . CEREBROVASCULAR ACCIDENT, HX OF   . Headache(784.0)   . HYPERLIPIDEMIA     takes crestor daily  . RENAL CALCULUS, HX OF   . H/O hiatal hernia   . CAD (coronary artery disease), 3 vessel significant disease.    . Impaired glucose tolerance   . S/P PTCA (percutaneous transluminal coronary angioplasty), 06/30/11 07/01/2011  . History of diastolic dysfunction, grade 2 by echo   . S/P coronary artery stent placement, PTCA/DES Resolute to mid-LAD via the LIMA graft, and PTCA of apical 95% stenosis 07/05/11 07/04/2011  . Seizures     INSULIN INDUCED  . ANEMIA-NOS   . NSTEMI (non-ST elevated myocardial infarction), 06/30/11 07/01/2011  . DEGENERATIVE JOINT DISEASE, SHOULDER     knee  . Gout     takes Uloric daily  . GERD     no meds required  . History of colon polyps   . HYPERTENSION     takes Norvasc,Catapress,Hydralazine,Imdur,and Metoprolol daily  . Insomnia     takes Ambien nightly  . Hyperlipidemia   . Family history of adverse reaction to anesthesia     Mother, hard to awaken  . Stroke     93/2005/2006/2007;left sided weakness.  1993 12/2013  . Sleep apnea     SLEEP STUDY IN [place] , NO MACHINE YET  1 YEAR  . DEPRESSION     2006- not depressed any longer  . RENAL INSUFFICIENCY     12/2013- TThSat  . CKD (chronic kidney disease) stage 4, GFR 15-29 ml/min   . Constipation     Past Surgical History  Procedure Laterality Date  . Tonsillectomy    . Stomach surgery  2009    achalasia  . Knee surgery Right 01/07/2011    arthroscopy  . Insertion of  dialysis catheter  04/01/2011    Procedure: INSERTION OF DIALYSIS CATHETER;  Surgeon: Nilda Simmer, MD;  Location: [facility] OR;  Service: Vascular;  Laterality: N/A;  . Cardiac catheterization    . Av fistula placement  12/02/2011    Procedure: ARTERIOVENOUS (AV) FISTULA CREATION;  Surgeon: Fransisco Hertz, MD;  Location: [facility] OR;  Service: Vascular;  Laterality: Left;  Ultrasound Guided  . Subclavian stent placement  12/20/2011  . Coronary artery bypass graft  04/01/2011    Procedure: CORONARY ARTERY BYPASS GRAFTING (CABG);  Surgeon: Kathlee Nations Suann Larry, MD;  Location: [facility] OR;  Service: Open Heart Surgery;  Laterality: N/A;  . Coronary angioplasty      06/30/11   AT [facility]  . Colonoscopy    . Ligation of arteriovenous  fistula Left 06/08/2012    Procedure: LIGATION OF ARTERIOVENOUS  FISTULA;  Surgeon: Fransisco Hertz, MD;  Location: [facility] OR;  Service: Vascular;  Laterality: Left;  . Av fistula placement Left 06/08/2012    Procedure: ARTERIOVENOUS (AV) FISTULA CREATION;  Surgeon: Fransisco Hertz, MD;  Location: [facility] OR;  Service: Vascular;  Laterality: Left;  . Left heart catheterization with coronary angiogram N/A 03/28/2011    Procedure: LEFT HEART CATHETERIZATION WITH CORONARY ANGIOGRAM;  Surgeon: Clovis Pu  Tresa Endo, MD;  Location: [facility] CATH LAB;  Service: Cardiovascular;  Laterality: N/A;  pending creatnine  . Left heart catheterization with coronary/graft angiogram N/A 06/30/2011    Procedure: LEFT HEART CATHETERIZATION WITH Isabel Caprice;  Surgeon: Thurmon Fair, MD;  Location: [facility] CATH LAB;  Service: Cardiovascular;  Laterality: N/A;  . Percutaneous coronary intervention-balloon only  06/30/2011    Procedure: PERCUTANEOUS CORONARY INTERVENTION-BALLOON ONLY;  Surgeon: Thurmon Fair, MD;  Location: [facility] CATH LAB;  Service: Cardiovascular;;  . Percutaneous coronary stent intervention (pci-s) N/A 07/04/2011    Procedure: PERCUTANEOUS CORONARY STENT INTERVENTION (PCI-S);  Surgeon: Lennette Bihari, MD;  Location: [facility]  CATH LAB;  Service: Cardiovascular;  Laterality: N/A;  . Arch aortogram N/A 02/19/2012    Procedure: ARCH AORTOGRAM;  Surgeon: Fransisco Hertz, MD;  Location: [facility] CATH LAB;  Service: Cardiovascular;  Laterality: N/A;  . Hernia repair  2013  . Bascilic vein transposition Left 09/06/2014    Procedure: FIRST STAGE BASILIC VEIN TRANSPOSITION ;  Surgeon: Fransisco Hertz, MD;  Location: [facility] OR;  Service: Vascular;  Laterality: Left;  . Nm myocar perf ejection fraction  06/25/2011    There is evidence of mild ischemia in the basal inferolateral and mid inferolateral regions. (Extent 7 %) The post-stress ejection fraction is 44%. There is mild hypocontractility in the distal inferoapical segment. baseline T wave inversion is noted in leads I, avL, II, V2. This is a low risk scan. The present perfusion study suggests significant myocardial salvage with mild residual ischemia .    Social History   Social History  . Marital Status: Divorced    Spouse Name: N/A  . Number of Children: N/A  . Years of Education: N/A   Occupational History  . Not on file.   Social History Main Topics  . Smoking status: Never Smoker   . Smokeless tobacco: Former Neurosurgeon    Types: Chew    Quit date: 03/18/2003     Comment: chewed tobacco   . Alcohol Use: 1.2 oz/week    0 Standard drinks or equivalent, 2 Shots of liquor per week     Comment: rarely; beer  . Drug Use: No  . Sexual Activity:    Partners: Male   Other Topics Concern  . Not on file   Social History Narrative    Family History  Problem Relation Age of Onset  . Hypertension Other   . Stroke Other   . Hyperlipidemia Other   . Cancer Mother   . Heart disease Mother   . Hyperlipidemia Mother   . Hypertension Mother   . Cancer Father   . Heart disease Father     Current Outpatient Prescriptions  Medication Sig Dispense Refill  . acetaminophen (TYLENOL) 500 MG tablet Take 1,000 mg by mouth every 6 (six) hours as needed (pain).    Marland Kitchen aspirin EC 81 MG EC  tablet Take 1 tablet (81 mg total) by mouth daily.    . cetirizine (ZYRTEC) 10 MG tablet Take 10 mg by mouth daily as needed for allergies.     . ciclopirox (PENLAC) 8 % solution Apply topically at bedtime. Apply over nail and surrounding skin. Apply daily over previous coat. After seven (7) days, may remove with alcohol and continue cycle. 6.6 mL 2  . cinacalcet (SENSIPAR) 30 MG tablet Take 30 mg by mouth at bedtime.    . cloNIDine (CATAPRES) 0.2 MG tablet Take 1 tablet (0.2 mg total) by mouth 2 (two) times daily. 60 tablet 11  .  clopidogrel (PLAVIX) 75 MG tablet Take 1 tablet (75 mg total) by mouth at bedtime. 90 tablet 0  . febuxostat (ULORIC) 40 MG tablet Take 40 mg by mouth daily.     Marland Kitchen GREEN TEA, CAMILLIA SINENSIS, PO Take 1 capsule by mouth daily.     . hydrALAZINE (APRESOLINE) 10 MG tablet Take 10 mg by mouth 2 (two) times daily.    . isosorbide mononitrate (IMDUR) 30 MG 24 hr tablet Take 1 tablet (30 mg total) by mouth daily. 30 tablet 6  . KRILL OIL PO Take 1 capsule by mouth 2 (two) times daily.     Marland Kitchen lanthanum ([name]) 1000 MG chewable tablet Chew 2,000 mg by mouth 3 (three) times daily with meals.    . metoprolol (LOPRESSOR) 50 MG tablet Take 1 tablet (50 mg total) by mouth 2 (two) times daily. 180 tablet 3  . multivitamin (RENA-VIT) TABS tablet Take 1 tablet by mouth at bedtime.     Marland Kitchen NITROSTAT 0.4 MG SL tablet PLACE 1 TABLET UNDER TONGUE AS NEEDED FOR CHEST PAIN EVERY 5 MINUTES (MAX 3DOSES) 25 tablet 4  . ranolazine (RANEXA) 500 MG 12 hr tablet Take 1 tablet (500 mg total) by mouth daily. 30 tablet 0  . rosuvastatin (CRESTOR) 20 MG tablet Take 1 tablet (20 mg total) by mouth every evening. 90 tablet 3  . terazosin (HYTRIN) 10 MG capsule TAKE 1 CAPSULE BY MOUTH AT BEDTIME. 90 capsule 2  . ticagrelor (BRILINTA) 90 MG TABS tablet Take 1 tablet (90 mg total) by mouth daily. 32 tablet 0  . zolpidem (AMBIEN) 10 MG tablet Take 1 tablet (10 mg total) by mouth at bedtime as needed for  sleep. 30 tablet 5   No current facility-administered medications for this visit.     Allergies  Allergen Reactions  . Shrimp [Shellfish Allergy] Shortness Of Breath  . Insulins Other (See Comments)    Patient unsure as to the kind of insulin but states that it has previously caused seizures. This occurred in the hospital in 2006; but in most recent hospitalization (Jan 2013) received insulin but did not have reaction  . Atorvastatin Other (See Comments)    weakness  . Simvastatin Other (See Comments)     abnormal liver tests  . Ultram [Tramadol] Other (See Comments)    Liver enzyme      REVIEW OF SYSTEMS:  (Positives checked otherwise negative)  CARDIOVASCULAR:    chest pain,   chest pressure,   palpitations,   shortness of breath when laying flat,   shortness of breath with exertion,    pain in feet when walking,   pain in feet when laying flat,  history of blood clot in veins (DVT),   history of phlebitis,   swelling in legs,   varicose veins  PULMONARY:    productive cough,   asthma,   wheezing  NEUROLOGIC:    weakness in arms or legs,   numbness in arms or legs,   difficulty speaking or slurred speech,   temporary loss of vision in one eye,   dizziness  HEMATOLOGIC:    bleeding problems,   problems with blood clotting too easily  MUSCULOSKEL:    joint pain,  joint swelling  GASTROINTEST:    vomiting blood,   blood in stool     GENITOURINARY:     burning with urination,   blood in urine  ESRD-HD: T-R-S  PSYCHIATRIC:    history of major depression  INTEGUMENTARY:    rashes,   ulcers  CONSTITUTIONAL:    fever,   chills   For VQI Use Only  PRE-ADM LIVING: Home  AMB STATUS: Ambulatory   Physical Examination Filed Vitals:   11/24/14 1022 11/24/14 1028  BP: 146/103 126/91  Pulse: 88 82  Temp: 98.4 F (36.9 C)   TempSrc: Oral   Resp:  16   Height: 6' (1.829 m)   Weight: 224 lb (101.606 kg)   SpO2: 99%    Pulmonary: Sym exp, good air movt, CTAB, no rales, rhonchi, & wheezing  Cardiac: RRR, Nl S1, S2, no Murmurs, rubs or gallops  LUE: Incision is healed, skin feels warm, hand grip is 5/5, sensation in digits is intact, palpable thrill, bruit can be auscultated , on Sonosite: >7 mm in diameter  Medical Decision Making  [name] is a 54 y.o. ([birth year]) male who presents s/p L 1st BRVT (non-mature)  The patient is ready for the 2nd stage, i.e. Transposition.  This is scheduled for 21 SEP 16.  Risk, benefits, and alternatives to access surgery were discussed.  The patient is aware the risks include but are not limited to: bleeding, infection, steal syndrome, nerve damage, ischemic monomelic neuropathy, failure to mature, need for additional procedures, death and stroke.    The patient agrees to proceed forward with the procedure.  Thank you for allowing [place] to participate in this patient's care.  Leonides Sake, MD [facility] Office: [phone] Pager: [phone]  11/24/2014, 10:53 AM

## 2014-12-06 NOTE — Anesthesia Postprocedure Evaluation (Signed)
  Anesthesia Post-op Note  Patient: [name]  Procedure(s) Performed: Procedure(s): SECOND STAGE BRACHIAL VEIN TRANSPOSITION  (Left) REVISION OF ARTERIOVENOUS GORETEX GRAFT USING 56mmx10cm (Left)  Patient Location: PACU  Anesthesia Type:General  Level of Consciousness: awake, alert , oriented and patient cooperative  Airway and Oxygen Therapy: Patient Spontanous Breathing  Post-op Pain: none  Post-op Assessment: Post-op Vital signs reviewed, Patient's Cardiovascular Status Stable, Respiratory Function Stable, Patent Airway, No signs of Nausea or vomiting and Pain level controlled              Post-op Vital Signs: Reviewed and stable  Last Vitals:  Filed Vitals:   12/06/14 1445  BP: 153/88  Pulse: 72  Temp:   Resp: 11    Complications: No apparent anesthesia complications

## 2014-12-06 NOTE — Progress Notes (Signed)
Multiple attempts made to start IV and was unsuccessful.  Notified Dr. Jean Rosenthal and received verbal order to access dialysis catheter.

## 2014-12-06 NOTE — Discharge Instructions (Signed)
° ° °  12/06/2014 [name] [id number] Sep 07, 1960  Surgeon(s): Fransisco Hertz, MD  Procedure(s): SECOND STAGE BRACHIAL VEIN TRANSPOSITION  x Do not stick fistula until pt is seen by Dr. Imogene Burn

## 2014-12-06 NOTE — Transfer of Care (Signed)
Immediate Anesthesia Transfer of Care Note  Patient: [name]  Procedure(s) Performed: Procedure(s): SECOND STAGE BRACHIAL VEIN TRANSPOSITION  (Left) REVISION OF ARTERIOVENOUS GORETEX GRAFT USING 67mmx10cm (Left)  Patient Location: PACU  Anesthesia Type:General  Level of Consciousness: awake and alert   Airway & Oxygen Therapy: Patient Spontanous Breathing and Patient connected to nasal cannula oxygen  Post-op Assessment: Report given to RN and Post -op Vital signs reviewed and stable  Post vital signs: Reviewed and stable  Last Vitals:  Filed Vitals:   12/06/14 0902  BP:   Pulse:   Temp: 36.9 C    Complications: No apparent anesthesia complications

## 2014-12-06 NOTE — Anesthesia Preprocedure Evaluation (Addendum)
Anesthesia Evaluation  Patient identified by MRN, date of birth, ID band Patient awake    Reviewed: Allergy & Precautions, NPO status , Patient's Chart, lab work & pertinent test results  History of Anesthesia Complications Negative for: history of anesthetic complications  Airway Mallampati: II  TM Distance: >3 FB Neck ROM: Full    Dental  (+) Loose, Dental Advisory Given   Pulmonary sleep apnea (does not use CPAP) , former smoker (quit '05),    breath sounds clear to auscultation       Cardiovascular hypertension, Pt. on medications and Pt. on home beta blockers (-) angina+ CAD, + Past MI ('13 NSTEMI), + Cardiac Stents and + CABG (2013)   Rhythm:Regular Rate:Normal  '15 ECHO: EF 65-70%, valves OK   Neuro/Psych CVA, No Residual Symptoms    GI/Hepatic Neg liver ROS, GERD  Controlled,  Endo/Other  Morbid obesity  Renal/GU ESRF and DialysisRenal disease (TuThSa, K+ 3.9)     Musculoskeletal   Abdominal (+) + obese,   Peds  Hematology  (+) Blood dyscrasia (plavix, brilinta, Hb 10.5), ,   Anesthesia Other Findings   Reproductive/Obstetrics                           Anesthesia Physical Anesthesia Plan  ASA: III  Anesthesia Plan: General   Post-op Pain Management:    Induction: Intravenous  Airway Management Planned: LMA  Additional Equipment:   Intra-op Plan:   Post-operative Plan:   Informed Consent: I have reviewed the patients History and Physical, chart, labs and discussed the procedure including the risks, benefits and alternatives for the proposed anesthesia with the patient or authorized representative who has indicated his/her understanding and acceptance.   Dental advisory given  Plan Discussed with: CRNA and Surgeon  Anesthesia Plan Comments: (Plan routine monitors, GA- LMA OK)        Anesthesia Quick Evaluation

## 2014-12-06 NOTE — Anesthesia Procedure Notes (Signed)
Procedure Name: LMA Insertion Date/Time: 12/06/2014 10:33 AM Performed by: Reine Just Pre-anesthesia Checklist: Patient identified, Emergency Drugs available, Suction available, Patient being monitored and Timeout performed Patient Re-evaluated:Patient Re-evaluated prior to inductionOxygen Delivery Method: Circle system utilized and Simple face mask Preoxygenation: Pre-oxygenation with 100% oxygen Intubation Type: IV induction Ventilation: Mask ventilation without difficulty LMA: LMA inserted LMA Size: 5.0 Number of attempts: 1 Airway Equipment and Method: Patient positioned with wedge pillow Placement Confirmation: positive ETCO2 and breath sounds checked- equal and bilateral Tube secured with: Tape Dental Injury: Teeth and Oropharynx as per pre-operative assessment

## 2014-12-07 ENCOUNTER — Encounter (HOSPITAL_COMMUNITY): Payer: Self-pay | Admitting: Vascular Surgery

## 2014-12-07 ENCOUNTER — Telehealth: Payer: Self-pay | Admitting: Vascular Surgery

## 2014-12-07 DIAGNOSIS — N186 End stage renal disease: Secondary | ICD-10-CM | POA: Diagnosis not present

## 2014-12-07 DIAGNOSIS — I120 Hypertensive chronic kidney disease with stage 5 chronic kidney disease or end stage renal disease: Secondary | ICD-10-CM | POA: Diagnosis not present

## 2014-12-07 DIAGNOSIS — Z951 Presence of aortocoronary bypass graft: Secondary | ICD-10-CM | POA: Diagnosis not present

## 2014-12-07 DIAGNOSIS — Z992 Dependence on renal dialysis: Secondary | ICD-10-CM | POA: Diagnosis not present

## 2014-12-07 DIAGNOSIS — Z8673 Personal history of transient ischemic attack (TIA), and cerebral infarction without residual deficits: Secondary | ICD-10-CM | POA: Diagnosis not present

## 2014-12-07 DIAGNOSIS — E785 Hyperlipidemia, unspecified: Secondary | ICD-10-CM | POA: Diagnosis not present

## 2014-12-07 MED FILL — Thrombin For Soln 20000 Unit: CUTANEOUS | Qty: 1 | Status: AC

## 2014-12-07 NOTE — Progress Notes (Signed)
Pt status post insertion of dialysis fistula access in left upper arm. Site + for bruit and thrill. Pt able to move left arm without any complications noted. Pt is stable and has been discharged to home. He is waiting for his sister to pick him up. I reviewed discharge instructions with pt. He verbalized understanding.

## 2014-12-07 NOTE — Progress Notes (Signed)
Patient blood pressure is trending down a lot, MD on call notified. Order to hold am Clonidine. Will pass this on to the incoming RN. Will continue to monitor.

## 2014-12-07 NOTE — Telephone Encounter (Addendum)
-----   Message from Sharee Pimple, RN sent at 12/06/2014  5:14 PM EDT ----- Regarding: Schedule   ----- Message -----    From: Sharee Pimple, RN    Sent: 12/06/2014   5:13 PM      To: Barnett Hatter Subject: Charge                                           ----- Message -----    From: Fransisco Hertz, MD    Sent: 12/06/2014   2:26 PM      To: [facility] Charge [address]  KYUSS HESCH [id number] 03-23-1960  PROCEDURE: left second stage brachial vein transposition (brachiobrachial arteriovenous fistula) with revision with interposition graft  Asst: Samantha Rhyne, PACl Karsten Ro, PAC   Follow-up: 2-4 weeks   notified patient of post op appt. on 01-12-15 at 1:45 with dr. Imogene Burn

## 2014-12-08 DIAGNOSIS — D631 Anemia in chronic kidney disease: Secondary | ICD-10-CM | POA: Diagnosis not present

## 2014-12-08 DIAGNOSIS — N186 End stage renal disease: Secondary | ICD-10-CM | POA: Diagnosis not present

## 2014-12-08 DIAGNOSIS — Z23 Encounter for immunization: Secondary | ICD-10-CM | POA: Diagnosis not present

## 2014-12-08 DIAGNOSIS — D509 Iron deficiency anemia, unspecified: Secondary | ICD-10-CM | POA: Diagnosis not present

## 2014-12-08 DIAGNOSIS — N2581 Secondary hyperparathyroidism of renal origin: Secondary | ICD-10-CM | POA: Diagnosis not present

## 2014-12-09 DIAGNOSIS — D509 Iron deficiency anemia, unspecified: Secondary | ICD-10-CM | POA: Diagnosis not present

## 2014-12-09 DIAGNOSIS — Z23 Encounter for immunization: Secondary | ICD-10-CM | POA: Diagnosis not present

## 2014-12-09 DIAGNOSIS — N2581 Secondary hyperparathyroidism of renal origin: Secondary | ICD-10-CM | POA: Diagnosis not present

## 2014-12-09 DIAGNOSIS — D631 Anemia in chronic kidney disease: Secondary | ICD-10-CM | POA: Diagnosis not present

## 2014-12-09 DIAGNOSIS — N186 End stage renal disease: Secondary | ICD-10-CM | POA: Diagnosis not present

## 2014-12-11 ENCOUNTER — Other Ambulatory Visit: Payer: Self-pay | Admitting: Cardiology

## 2014-12-11 NOTE — Telephone Encounter (Signed)
Rx request sent to pharmacy.  

## 2014-12-12 DIAGNOSIS — N2581 Secondary hyperparathyroidism of renal origin: Secondary | ICD-10-CM | POA: Diagnosis not present

## 2014-12-12 DIAGNOSIS — D631 Anemia in chronic kidney disease: Secondary | ICD-10-CM | POA: Diagnosis not present

## 2014-12-12 DIAGNOSIS — Z23 Encounter for immunization: Secondary | ICD-10-CM | POA: Diagnosis not present

## 2014-12-12 DIAGNOSIS — N186 End stage renal disease: Secondary | ICD-10-CM | POA: Diagnosis not present

## 2014-12-12 DIAGNOSIS — D509 Iron deficiency anemia, unspecified: Secondary | ICD-10-CM | POA: Diagnosis not present

## 2014-12-14 DIAGNOSIS — Z23 Encounter for immunization: Secondary | ICD-10-CM | POA: Diagnosis not present

## 2014-12-14 DIAGNOSIS — D631 Anemia in chronic kidney disease: Secondary | ICD-10-CM | POA: Diagnosis not present

## 2014-12-14 DIAGNOSIS — N186 End stage renal disease: Secondary | ICD-10-CM | POA: Diagnosis not present

## 2014-12-14 DIAGNOSIS — N2581 Secondary hyperparathyroidism of renal origin: Secondary | ICD-10-CM | POA: Diagnosis not present

## 2014-12-14 DIAGNOSIS — D509 Iron deficiency anemia, unspecified: Secondary | ICD-10-CM | POA: Diagnosis not present

## 2014-12-14 NOTE — Discharge Summary (Signed)
[facility] Discharge Summary   Patient ID:  [name] MRN: [id number] DOB/AGE: 1960/07/12 54 y.o.  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 ACHALASIA   . CEREBROVASCULAR ACCIDENT, HX OF   . Headache(784.0)   . HYPERLIPIDEMIA     takes crestor daily  . RENAL CALCULUS, HX OF   . H/O hiatal hernia   . CAD (coronary artery disease), 3 vessel significant disease.    . Impaired glucose tolerance   . S/P PTCA (percutaneous transluminal coronary angioplasty), 06/30/11 07/01/2011  . History of diastolic dysfunction, grade 2 by echo   . S/P coronary artery stent placement, PTCA/DES Resolute to mid-LAD via the LIMA graft, and PTCA of apical 95% stenosis 07/05/11 07/04/2011  . Seizures     INSULIN INDUCED  . ANEMIA-NOS   . NSTEMI (non-ST elevated myocardial infarction), 06/30/11 07/01/2011  . DEGENERATIVE JOINT DISEASE, SHOULDER     knee  . Gout     takes Uloric daily  . GERD     no meds required  . History of colon polyps   . HYPERTENSION     takes Norvasc,Catapress,Hydralazine,Imdur,and Metoprolol daily  . Insomnia     takes Ambien nightly  . Hyperlipidemia   . Family history of adverse reaction to anesthesia     Mother, hard to awaken  . DEPRESSION     2006- not depressed any longer  . Constipation   . Stroke     93/2005/2006/2007;left sided weakness.  1993 12/2013  . Sleep apnea     SLEEP STUDY IN [place] , NO MACHINE YET  1 YEAR  . RENAL INSUFFICIENCY     12/2013- TThSat  . CKD (chronic kidney disease) stage 4, GFR 15-29 ml/min     Procedure(s): SECOND STAGE BRACHIAL VEIN TRANSPOSITION  REVISION OF ARTERIOVENOUS GORETEX GRAFT USING 51mmx10cm  Discharged Condition: good  HPI: [name] is a 54  y.o. (11/12/1960) male who presents for postoperative follow-up for: L 1st stage BRVT (Date: 09/06/14). The patient's wounds are healed. The patient notes no steal symptoms. The patient is able to complete their activities of daily living. The patient's current symptoms are: none.   Hospital Course:  [name] is a 54 y.o. male is S/PLeft Procedure(s): SECOND STAGE BRACHIAL VEIN TRANSPOSITION  REVISION OF ARTERIOVENOUS GORETEX GRAFT USING 43mmx10cm POD# 1 Site + for bruit and thrill. Pt able to move left arm without any complications noted. Pt is stable and has been discharged to home. He is waiting for his sister to pick him up. I reviewed discharge instructions with pt. He verbalized understanding.  Consults:     Significant Diagnostic Studies: CBC Lab Results  Component Value Date   WBC 6.4 06/08/2014   HGB 10.5* 12/06/2014   HCT 31.0* 12/06/2014   MCV 95.6 06/08/2014   PLT 271 06/08/2014    BMET    Component Value Date/Time   NA 137 12/06/2014 0915   K 3.9 12/06/2014 0915   CL 93* 06/08/2014 1744   CO2 24 06/08/2014 1744   GLUCOSE 104* 12/06/2014 0915   BUN 21 06/08/2014 1744   CREATININE 7.69* 06/08/2014 1744   CALCIUM 9.3 06/08/2014 1744   GFRNONAA 7* 06/08/2014 1744   GFRAA 8* 06/08/2014 1744   COAG Lab Results  Component Value Date   INR 1.27 12/29/2013   INR 1.11 12/02/2011   INR 1.03 07/04/2011     Disposition:  Discharge to :Home Discharge Instructions    Call MD for:  redness, tenderness, or signs of infection (pain, swelling, bleeding, redness, odor or green/yellow discharge around incision site)    Complete by:  As directed      Call MD for:  severe or increased pain, loss or decreased feeling  in affected limb(s)    Complete by:  As directed      Call MD for:  temperature >100.5    Complete by:  As directed      Discharge patient    Complete by:  As directed   Discharge pt to home     Driving Restrictions    Complete by:  As directed    No driving for 24 hours and while taking pain medication.     Lifting restrictions    Complete by:  As directed   No lifting for 3 weeks     Resume previous diet    Complete by:  As directed      may wash over wound with mild soap and water    Complete by:  As directed             Medication List    STOP taking these medications        isosorbide mononitrate 30 MG 24 hr tablet  Commonly known as:  IMDUR     ticagrelor 90 MG Tabs tablet  Commonly known as:  BRILINTA      TAKE these medications        acetaminophen 500 MG tablet  Commonly known as:  TYLENOL  Take 1,000 mg by mouth every 6 (six) hours as needed (pain).     aspirin 81 MG EC tablet  Take 1 tablet (81 mg total) by mouth daily.     cetirizine 10 MG tablet  Commonly known as:  ZYRTEC  Take 10 mg by mouth daily as needed for allergies.     ciclopirox 8 % solution  Commonly known as:  PENLAC  Apply topically at bedtime. Apply over nail and surrounding skin. Apply daily over previous coat. After seven (7) days, may remove with alcohol and continue cycle.     cinacalcet 30 MG tablet  Commonly known as:  SENSIPAR  Take 30 mg by mouth at bedtime.     cloNIDine 0.2 MG tablet  Commonly known as:  CATAPRES  Take 1 tablet (0.2 mg total) by mouth 2 (two) times daily.     clopidogrel 75 MG tablet  Commonly known as:  PLAVIX  Take 1 tablet (75 mg total) by mouth at bedtime.     febuxostat 40 MG tablet  Commonly known as:  ULORIC  Take 40 mg by mouth daily.     GREEN TEA (CAMILLIA SINENSIS) PO  Take 1 capsule by mouth daily.     hydrALAZINE 10 MG tablet  Commonly known as:  APRESOLINE  Take 10 mg by mouth 2 (two) times daily.     KRILL OIL PO  Take 1 capsule by mouth 2 (two) times daily.     lanthanum 1000 MG chewable tablet  Commonly known as:  [name]  Chew 2,000 mg by mouth 3 (three) times daily with meals.     metoprolol 50 MG tablet  Commonly known as:  LOPRESSOR  Take 1 tablet (50 mg  total) by mouth 2 (two) times  daily.     multivitamin Tabs tablet  Take 1 tablet by mouth at bedtime.     NITROSTAT 0.4 MG SL tablet  Generic drug:  nitroGLYCERIN  PLACE 1 TABLET UNDER TONGUE AS NEEDED FOR CHEST PAIN EVERY 5 MINUTES (MAX 3DOSES)     oxyCODONE 5 MG immediate release tablet  Commonly known as:  ROXICODONE  Take 1 tablet (5 mg total) by mouth every 6 (six) hours as needed.     ranolazine 500 MG 12 hr tablet  Commonly known as:  RANEXA  Take 1 tablet (500 mg total) by mouth daily.     rosuvastatin 20 MG tablet  Commonly known as:  CRESTOR  Take 1 tablet (20 mg total) by mouth every evening.     terazosin 10 MG capsule  Commonly known as:  HYTRIN  TAKE 1 CAPSULE BY MOUTH AT BEDTIME.     zolpidem 10 MG tablet  Commonly known as:  AMBIEN  Take 1 tablet (10 mg total) by mouth at bedtime as needed for sleep.       Verbal and written Discharge instructions given to the patient. Wound care per Discharge AVS     Follow-up Information    Follow up with Leonides Sake, MD In 4 weeks.   Specialties:  Vascular Surgery, Cardiology   Why:  Office will call you to arrange your appt (sent)   Contact information:   [address] [place] [address] [phone]       Signed: Clinton Gallant [facility] 12/14/2014, 12:56 PM  Addendum  I have independently interviewed and examined the patient, and I agree with the physician assistant's discharge summary.  Leonides Sake, MD [facility] [place] Office: [phone] Pager: [phone]  12/14/2014, 7:10 PM

## 2014-12-15 DIAGNOSIS — Z992 Dependence on renal dialysis: Secondary | ICD-10-CM | POA: Diagnosis not present

## 2014-12-15 DIAGNOSIS — I120 Hypertensive chronic kidney disease with stage 5 chronic kidney disease or end stage renal disease: Secondary | ICD-10-CM | POA: Diagnosis not present

## 2014-12-15 DIAGNOSIS — N186 End stage renal disease: Secondary | ICD-10-CM | POA: Diagnosis not present

## 2014-12-16 DIAGNOSIS — N186 End stage renal disease: Secondary | ICD-10-CM | POA: Diagnosis not present

## 2014-12-16 DIAGNOSIS — N2581 Secondary hyperparathyroidism of renal origin: Secondary | ICD-10-CM | POA: Diagnosis not present

## 2014-12-16 DIAGNOSIS — D509 Iron deficiency anemia, unspecified: Secondary | ICD-10-CM | POA: Diagnosis not present

## 2014-12-16 DIAGNOSIS — D631 Anemia in chronic kidney disease: Secondary | ICD-10-CM | POA: Diagnosis not present

## 2014-12-19 DIAGNOSIS — N2581 Secondary hyperparathyroidism of renal origin: Secondary | ICD-10-CM | POA: Diagnosis not present

## 2014-12-19 DIAGNOSIS — N186 End stage renal disease: Secondary | ICD-10-CM | POA: Diagnosis not present

## 2014-12-19 DIAGNOSIS — D631 Anemia in chronic kidney disease: Secondary | ICD-10-CM | POA: Diagnosis not present

## 2014-12-19 DIAGNOSIS — D509 Iron deficiency anemia, unspecified: Secondary | ICD-10-CM | POA: Diagnosis not present

## 2014-12-20 DIAGNOSIS — D631 Anemia in chronic kidney disease: Secondary | ICD-10-CM | POA: Diagnosis not present

## 2014-12-20 DIAGNOSIS — D509 Iron deficiency anemia, unspecified: Secondary | ICD-10-CM | POA: Diagnosis not present

## 2014-12-20 DIAGNOSIS — N186 End stage renal disease: Secondary | ICD-10-CM | POA: Diagnosis not present

## 2014-12-20 DIAGNOSIS — N2581 Secondary hyperparathyroidism of renal origin: Secondary | ICD-10-CM | POA: Diagnosis not present

## 2014-12-23 DIAGNOSIS — D631 Anemia in chronic kidney disease: Secondary | ICD-10-CM | POA: Diagnosis not present

## 2014-12-23 DIAGNOSIS — N2581 Secondary hyperparathyroidism of renal origin: Secondary | ICD-10-CM | POA: Diagnosis not present

## 2014-12-23 DIAGNOSIS — N186 End stage renal disease: Secondary | ICD-10-CM | POA: Diagnosis not present

## 2014-12-23 DIAGNOSIS — D509 Iron deficiency anemia, unspecified: Secondary | ICD-10-CM | POA: Diagnosis not present

## 2014-12-26 DIAGNOSIS — N2581 Secondary hyperparathyroidism of renal origin: Secondary | ICD-10-CM | POA: Diagnosis not present

## 2014-12-26 DIAGNOSIS — D509 Iron deficiency anemia, unspecified: Secondary | ICD-10-CM | POA: Diagnosis not present

## 2014-12-26 DIAGNOSIS — N186 End stage renal disease: Secondary | ICD-10-CM | POA: Diagnosis not present

## 2014-12-26 DIAGNOSIS — D631 Anemia in chronic kidney disease: Secondary | ICD-10-CM | POA: Diagnosis not present

## 2014-12-27 DIAGNOSIS — D509 Iron deficiency anemia, unspecified: Secondary | ICD-10-CM | POA: Diagnosis not present

## 2014-12-27 DIAGNOSIS — D631 Anemia in chronic kidney disease: Secondary | ICD-10-CM | POA: Diagnosis not present

## 2014-12-27 DIAGNOSIS — N2581 Secondary hyperparathyroidism of renal origin: Secondary | ICD-10-CM | POA: Diagnosis not present

## 2014-12-27 DIAGNOSIS — N186 End stage renal disease: Secondary | ICD-10-CM | POA: Diagnosis not present

## 2014-12-28 ENCOUNTER — Other Ambulatory Visit: Payer: Self-pay | Admitting: Cardiovascular Disease

## 2014-12-28 ENCOUNTER — Telehealth: Payer: Self-pay | Admitting: Internal Medicine

## 2014-12-28 DIAGNOSIS — D509 Iron deficiency anemia, unspecified: Secondary | ICD-10-CM | POA: Diagnosis not present

## 2014-12-28 DIAGNOSIS — D631 Anemia in chronic kidney disease: Secondary | ICD-10-CM | POA: Diagnosis not present

## 2014-12-28 DIAGNOSIS — N2581 Secondary hyperparathyroidism of renal origin: Secondary | ICD-10-CM | POA: Diagnosis not present

## 2014-12-28 DIAGNOSIS — N186 End stage renal disease: Secondary | ICD-10-CM | POA: Diagnosis not present

## 2014-12-28 NOTE — Telephone Encounter (Signed)
Pt's insurance [place] DME company in state

## 2014-12-28 NOTE — Telephone Encounter (Signed)
[name] from [facility] called regarding the status of an order for DME request Can you please call her at [phone]

## 2014-12-29 NOTE — Telephone Encounter (Signed)
Rx(s) sent to pharmacy electronically.  

## 2015-01-02 DIAGNOSIS — D509 Iron deficiency anemia, unspecified: Secondary | ICD-10-CM | POA: Diagnosis not present

## 2015-01-02 DIAGNOSIS — N186 End stage renal disease: Secondary | ICD-10-CM | POA: Diagnosis not present

## 2015-01-02 DIAGNOSIS — N2581 Secondary hyperparathyroidism of renal origin: Secondary | ICD-10-CM | POA: Diagnosis not present

## 2015-01-02 DIAGNOSIS — D631 Anemia in chronic kidney disease: Secondary | ICD-10-CM | POA: Diagnosis not present

## 2015-01-04 DIAGNOSIS — D631 Anemia in chronic kidney disease: Secondary | ICD-10-CM | POA: Diagnosis not present

## 2015-01-04 DIAGNOSIS — N2581 Secondary hyperparathyroidism of renal origin: Secondary | ICD-10-CM | POA: Diagnosis not present

## 2015-01-04 DIAGNOSIS — D509 Iron deficiency anemia, unspecified: Secondary | ICD-10-CM | POA: Diagnosis not present

## 2015-01-04 DIAGNOSIS — N186 End stage renal disease: Secondary | ICD-10-CM | POA: Diagnosis not present

## 2015-01-06 DIAGNOSIS — D631 Anemia in chronic kidney disease: Secondary | ICD-10-CM | POA: Diagnosis not present

## 2015-01-06 DIAGNOSIS — N186 End stage renal disease: Secondary | ICD-10-CM | POA: Diagnosis not present

## 2015-01-06 DIAGNOSIS — N2581 Secondary hyperparathyroidism of renal origin: Secondary | ICD-10-CM | POA: Diagnosis not present

## 2015-01-06 DIAGNOSIS — D509 Iron deficiency anemia, unspecified: Secondary | ICD-10-CM | POA: Diagnosis not present

## 2015-01-09 DIAGNOSIS — D631 Anemia in chronic kidney disease: Secondary | ICD-10-CM | POA: Diagnosis not present

## 2015-01-09 DIAGNOSIS — D509 Iron deficiency anemia, unspecified: Secondary | ICD-10-CM | POA: Diagnosis not present

## 2015-01-09 DIAGNOSIS — N2581 Secondary hyperparathyroidism of renal origin: Secondary | ICD-10-CM | POA: Diagnosis not present

## 2015-01-09 DIAGNOSIS — N186 End stage renal disease: Secondary | ICD-10-CM | POA: Diagnosis not present

## 2015-01-10 ENCOUNTER — Encounter: Payer: Self-pay | Admitting: Vascular Surgery

## 2015-01-11 DIAGNOSIS — N186 End stage renal disease: Secondary | ICD-10-CM | POA: Diagnosis not present

## 2015-01-11 DIAGNOSIS — D509 Iron deficiency anemia, unspecified: Secondary | ICD-10-CM | POA: Diagnosis not present

## 2015-01-11 DIAGNOSIS — N2581 Secondary hyperparathyroidism of renal origin: Secondary | ICD-10-CM | POA: Diagnosis not present

## 2015-01-11 DIAGNOSIS — D631 Anemia in chronic kidney disease: Secondary | ICD-10-CM | POA: Diagnosis not present

## 2015-01-12 ENCOUNTER — Ambulatory Visit (INDEPENDENT_AMBULATORY_CARE_PROVIDER_SITE_OTHER): Payer: Medicare Other | Admitting: Vascular Surgery

## 2015-01-12 ENCOUNTER — Encounter: Payer: Self-pay | Admitting: Vascular Surgery

## 2015-01-12 VITALS — BP 128/87 | HR 97 | Ht 72.0 in | Wt 217.0 lb

## 2015-01-12 DIAGNOSIS — N186 End stage renal disease: Secondary | ICD-10-CM

## 2015-01-12 DIAGNOSIS — N2581 Secondary hyperparathyroidism of renal origin: Secondary | ICD-10-CM | POA: Diagnosis not present

## 2015-01-12 DIAGNOSIS — D631 Anemia in chronic kidney disease: Secondary | ICD-10-CM | POA: Diagnosis not present

## 2015-01-12 DIAGNOSIS — D509 Iron deficiency anemia, unspecified: Secondary | ICD-10-CM | POA: Diagnosis not present

## 2015-01-12 DIAGNOSIS — Z992 Dependence on renal dialysis: Secondary | ICD-10-CM

## 2015-01-12 NOTE — Progress Notes (Signed)
    Postoperative Access Visit   History of Present Illness  [name] is a 54 y.o. year old male who presents for postoperative follow-up for: L 2nd BRVT (Date: 12/06/14).  The patient's wounds are healed.  The patient notes no steal symptoms.  The patient is able to complete their activities of daily living.  The patient's current symptoms are: none.  For VQI Use Only  PRE-ADM LIVING: Home  AMB STATUS: Ambulatory  Physical Examination Filed Vitals:   01/12/15 1332  BP: 128/87  Pulse: 97    LUE: Incision is healed, skin feels warm, hand grip is 5/5, sensation in digits is intact, palpable thrill, bruit can be auscultated , fistula is visible  Medical Decision Making  [name] is a 54 y.o. year old male who presents s/p L 2nd stage BRVT.   The patient's access is ready for use.  The patient's tunneled dialysis catheter can be removed after two successful cannulations and completed dialysis treatments.  Thank you for allowing [place] to participate in this patient's care.  Leonides Sake, MD [facility] [place] Office: [phone] Pager: [phone]  01/12/2015, 1:49 PM

## 2015-01-15 DIAGNOSIS — I120 Hypertensive chronic kidney disease with stage 5 chronic kidney disease or end stage renal disease: Secondary | ICD-10-CM | POA: Diagnosis not present

## 2015-01-15 DIAGNOSIS — N186 End stage renal disease: Secondary | ICD-10-CM | POA: Diagnosis not present

## 2015-01-15 DIAGNOSIS — Z992 Dependence on renal dialysis: Secondary | ICD-10-CM | POA: Diagnosis not present

## 2015-01-16 DIAGNOSIS — N186 End stage renal disease: Secondary | ICD-10-CM | POA: Diagnosis not present

## 2015-01-16 DIAGNOSIS — D631 Anemia in chronic kidney disease: Secondary | ICD-10-CM | POA: Diagnosis not present

## 2015-01-16 DIAGNOSIS — D509 Iron deficiency anemia, unspecified: Secondary | ICD-10-CM | POA: Diagnosis not present

## 2015-01-16 DIAGNOSIS — E8779 Other fluid overload: Secondary | ICD-10-CM | POA: Diagnosis not present

## 2015-01-16 DIAGNOSIS — N2581 Secondary hyperparathyroidism of renal origin: Secondary | ICD-10-CM | POA: Diagnosis not present

## 2015-01-18 DIAGNOSIS — D631 Anemia in chronic kidney disease: Secondary | ICD-10-CM | POA: Diagnosis not present

## 2015-01-18 DIAGNOSIS — N186 End stage renal disease: Secondary | ICD-10-CM | POA: Diagnosis not present

## 2015-01-18 DIAGNOSIS — E8779 Other fluid overload: Secondary | ICD-10-CM | POA: Diagnosis not present

## 2015-01-18 DIAGNOSIS — D509 Iron deficiency anemia, unspecified: Secondary | ICD-10-CM | POA: Diagnosis not present

## 2015-01-18 DIAGNOSIS — N2581 Secondary hyperparathyroidism of renal origin: Secondary | ICD-10-CM | POA: Diagnosis not present

## 2015-01-20 DIAGNOSIS — N186 End stage renal disease: Secondary | ICD-10-CM | POA: Diagnosis not present

## 2015-01-20 DIAGNOSIS — N2581 Secondary hyperparathyroidism of renal origin: Secondary | ICD-10-CM | POA: Diagnosis not present

## 2015-01-20 DIAGNOSIS — D631 Anemia in chronic kidney disease: Secondary | ICD-10-CM | POA: Diagnosis not present

## 2015-01-20 DIAGNOSIS — E8779 Other fluid overload: Secondary | ICD-10-CM | POA: Diagnosis not present

## 2015-01-20 DIAGNOSIS — D509 Iron deficiency anemia, unspecified: Secondary | ICD-10-CM | POA: Diagnosis not present

## 2015-01-23 DIAGNOSIS — D631 Anemia in chronic kidney disease: Secondary | ICD-10-CM | POA: Diagnosis not present

## 2015-01-23 DIAGNOSIS — D509 Iron deficiency anemia, unspecified: Secondary | ICD-10-CM | POA: Diagnosis not present

## 2015-01-23 DIAGNOSIS — E8779 Other fluid overload: Secondary | ICD-10-CM | POA: Diagnosis not present

## 2015-01-23 DIAGNOSIS — N2581 Secondary hyperparathyroidism of renal origin: Secondary | ICD-10-CM | POA: Diagnosis not present

## 2015-01-23 DIAGNOSIS — N186 End stage renal disease: Secondary | ICD-10-CM | POA: Diagnosis not present

## 2015-01-25 DIAGNOSIS — N186 End stage renal disease: Secondary | ICD-10-CM | POA: Diagnosis not present

## 2015-01-25 DIAGNOSIS — N2581 Secondary hyperparathyroidism of renal origin: Secondary | ICD-10-CM | POA: Diagnosis not present

## 2015-01-25 DIAGNOSIS — D631 Anemia in chronic kidney disease: Secondary | ICD-10-CM | POA: Diagnosis not present

## 2015-01-25 DIAGNOSIS — D509 Iron deficiency anemia, unspecified: Secondary | ICD-10-CM | POA: Diagnosis not present

## 2015-01-25 DIAGNOSIS — E8779 Other fluid overload: Secondary | ICD-10-CM | POA: Diagnosis not present

## 2015-01-26 DIAGNOSIS — E8779 Other fluid overload: Secondary | ICD-10-CM | POA: Diagnosis not present

## 2015-01-26 DIAGNOSIS — D509 Iron deficiency anemia, unspecified: Secondary | ICD-10-CM | POA: Diagnosis not present

## 2015-01-26 DIAGNOSIS — N2581 Secondary hyperparathyroidism of renal origin: Secondary | ICD-10-CM | POA: Diagnosis not present

## 2015-01-26 DIAGNOSIS — D631 Anemia in chronic kidney disease: Secondary | ICD-10-CM | POA: Diagnosis not present

## 2015-01-26 DIAGNOSIS — N186 End stage renal disease: Secondary | ICD-10-CM | POA: Diagnosis not present

## 2015-01-29 DIAGNOSIS — Z452 Encounter for adjustment and management of vascular access device: Secondary | ICD-10-CM | POA: Diagnosis not present

## 2015-01-30 DIAGNOSIS — D509 Iron deficiency anemia, unspecified: Secondary | ICD-10-CM | POA: Diagnosis not present

## 2015-01-30 DIAGNOSIS — N186 End stage renal disease: Secondary | ICD-10-CM | POA: Diagnosis not present

## 2015-01-30 DIAGNOSIS — D631 Anemia in chronic kidney disease: Secondary | ICD-10-CM | POA: Diagnosis not present

## 2015-01-30 DIAGNOSIS — N2581 Secondary hyperparathyroidism of renal origin: Secondary | ICD-10-CM | POA: Diagnosis not present

## 2015-01-30 DIAGNOSIS — E8779 Other fluid overload: Secondary | ICD-10-CM | POA: Diagnosis not present

## 2015-02-01 DIAGNOSIS — N2581 Secondary hyperparathyroidism of renal origin: Secondary | ICD-10-CM | POA: Diagnosis not present

## 2015-02-01 DIAGNOSIS — N186 End stage renal disease: Secondary | ICD-10-CM | POA: Diagnosis not present

## 2015-02-01 DIAGNOSIS — E8779 Other fluid overload: Secondary | ICD-10-CM | POA: Diagnosis not present

## 2015-02-01 DIAGNOSIS — D509 Iron deficiency anemia, unspecified: Secondary | ICD-10-CM | POA: Diagnosis not present

## 2015-02-01 DIAGNOSIS — D631 Anemia in chronic kidney disease: Secondary | ICD-10-CM | POA: Diagnosis not present

## 2015-02-03 DIAGNOSIS — E8779 Other fluid overload: Secondary | ICD-10-CM | POA: Diagnosis not present

## 2015-02-03 DIAGNOSIS — D631 Anemia in chronic kidney disease: Secondary | ICD-10-CM | POA: Diagnosis not present

## 2015-02-03 DIAGNOSIS — N2581 Secondary hyperparathyroidism of renal origin: Secondary | ICD-10-CM | POA: Diagnosis not present

## 2015-02-03 DIAGNOSIS — N186 End stage renal disease: Secondary | ICD-10-CM | POA: Diagnosis not present

## 2015-02-03 DIAGNOSIS — D509 Iron deficiency anemia, unspecified: Secondary | ICD-10-CM | POA: Diagnosis not present

## 2015-02-05 DIAGNOSIS — N186 End stage renal disease: Secondary | ICD-10-CM | POA: Diagnosis not present

## 2015-02-05 DIAGNOSIS — D631 Anemia in chronic kidney disease: Secondary | ICD-10-CM | POA: Diagnosis not present

## 2015-02-05 DIAGNOSIS — D509 Iron deficiency anemia, unspecified: Secondary | ICD-10-CM | POA: Diagnosis not present

## 2015-02-05 DIAGNOSIS — N2581 Secondary hyperparathyroidism of renal origin: Secondary | ICD-10-CM | POA: Diagnosis not present

## 2015-02-05 DIAGNOSIS — E8779 Other fluid overload: Secondary | ICD-10-CM | POA: Diagnosis not present

## 2015-02-09 DIAGNOSIS — N2581 Secondary hyperparathyroidism of renal origin: Secondary | ICD-10-CM | POA: Diagnosis not present

## 2015-02-09 DIAGNOSIS — D509 Iron deficiency anemia, unspecified: Secondary | ICD-10-CM | POA: Diagnosis not present

## 2015-02-09 DIAGNOSIS — N186 End stage renal disease: Secondary | ICD-10-CM | POA: Diagnosis not present

## 2015-02-09 DIAGNOSIS — E8779 Other fluid overload: Secondary | ICD-10-CM | POA: Diagnosis not present

## 2015-02-09 DIAGNOSIS — D631 Anemia in chronic kidney disease: Secondary | ICD-10-CM | POA: Diagnosis not present

## 2015-02-10 DIAGNOSIS — D509 Iron deficiency anemia, unspecified: Secondary | ICD-10-CM | POA: Diagnosis not present

## 2015-02-10 DIAGNOSIS — N2581 Secondary hyperparathyroidism of renal origin: Secondary | ICD-10-CM | POA: Diagnosis not present

## 2015-02-10 DIAGNOSIS — N186 End stage renal disease: Secondary | ICD-10-CM | POA: Diagnosis not present

## 2015-02-10 DIAGNOSIS — E8779 Other fluid overload: Secondary | ICD-10-CM | POA: Diagnosis not present

## 2015-02-10 DIAGNOSIS — D631 Anemia in chronic kidney disease: Secondary | ICD-10-CM | POA: Diagnosis not present

## 2015-02-11 DIAGNOSIS — D509 Iron deficiency anemia, unspecified: Secondary | ICD-10-CM | POA: Diagnosis not present

## 2015-02-11 DIAGNOSIS — N2581 Secondary hyperparathyroidism of renal origin: Secondary | ICD-10-CM | POA: Diagnosis not present

## 2015-02-11 DIAGNOSIS — D631 Anemia in chronic kidney disease: Secondary | ICD-10-CM | POA: Diagnosis not present

## 2015-02-11 DIAGNOSIS — N186 End stage renal disease: Secondary | ICD-10-CM | POA: Diagnosis not present

## 2015-02-11 DIAGNOSIS — E8779 Other fluid overload: Secondary | ICD-10-CM | POA: Diagnosis not present

## 2015-02-13 DIAGNOSIS — D509 Iron deficiency anemia, unspecified: Secondary | ICD-10-CM | POA: Diagnosis not present

## 2015-02-13 DIAGNOSIS — N2581 Secondary hyperparathyroidism of renal origin: Secondary | ICD-10-CM | POA: Diagnosis not present

## 2015-02-13 DIAGNOSIS — N186 End stage renal disease: Secondary | ICD-10-CM | POA: Diagnosis not present

## 2015-02-13 DIAGNOSIS — D631 Anemia in chronic kidney disease: Secondary | ICD-10-CM | POA: Diagnosis not present

## 2015-02-13 DIAGNOSIS — E8779 Other fluid overload: Secondary | ICD-10-CM | POA: Diagnosis not present

## 2015-02-14 DIAGNOSIS — Z992 Dependence on renal dialysis: Secondary | ICD-10-CM | POA: Diagnosis not present

## 2015-02-14 DIAGNOSIS — I120 Hypertensive chronic kidney disease with stage 5 chronic kidney disease or end stage renal disease: Secondary | ICD-10-CM | POA: Diagnosis not present

## 2015-02-14 DIAGNOSIS — N186 End stage renal disease: Secondary | ICD-10-CM | POA: Diagnosis not present

## 2015-02-15 DIAGNOSIS — D509 Iron deficiency anemia, unspecified: Secondary | ICD-10-CM | POA: Diagnosis not present

## 2015-02-15 DIAGNOSIS — N2581 Secondary hyperparathyroidism of renal origin: Secondary | ICD-10-CM | POA: Diagnosis not present

## 2015-02-15 DIAGNOSIS — N186 End stage renal disease: Secondary | ICD-10-CM | POA: Diagnosis not present

## 2015-02-15 DIAGNOSIS — D631 Anemia in chronic kidney disease: Secondary | ICD-10-CM | POA: Diagnosis not present

## 2015-02-17 DIAGNOSIS — N2581 Secondary hyperparathyroidism of renal origin: Secondary | ICD-10-CM | POA: Diagnosis not present

## 2015-02-17 DIAGNOSIS — N186 End stage renal disease: Secondary | ICD-10-CM | POA: Diagnosis not present

## 2015-02-17 DIAGNOSIS — D509 Iron deficiency anemia, unspecified: Secondary | ICD-10-CM | POA: Diagnosis not present

## 2015-02-17 DIAGNOSIS — D631 Anemia in chronic kidney disease: Secondary | ICD-10-CM | POA: Diagnosis not present

## 2015-02-20 DIAGNOSIS — D631 Anemia in chronic kidney disease: Secondary | ICD-10-CM | POA: Diagnosis not present

## 2015-02-20 DIAGNOSIS — D509 Iron deficiency anemia, unspecified: Secondary | ICD-10-CM | POA: Diagnosis not present

## 2015-02-20 DIAGNOSIS — N186 End stage renal disease: Secondary | ICD-10-CM | POA: Diagnosis not present

## 2015-02-20 DIAGNOSIS — N2581 Secondary hyperparathyroidism of renal origin: Secondary | ICD-10-CM | POA: Diagnosis not present

## 2015-02-22 DIAGNOSIS — N186 End stage renal disease: Secondary | ICD-10-CM | POA: Diagnosis not present

## 2015-02-22 DIAGNOSIS — D631 Anemia in chronic kidney disease: Secondary | ICD-10-CM | POA: Diagnosis not present

## 2015-02-22 DIAGNOSIS — N2581 Secondary hyperparathyroidism of renal origin: Secondary | ICD-10-CM | POA: Diagnosis not present

## 2015-02-22 DIAGNOSIS — D509 Iron deficiency anemia, unspecified: Secondary | ICD-10-CM | POA: Diagnosis not present

## 2015-02-23 DIAGNOSIS — N186 End stage renal disease: Secondary | ICD-10-CM | POA: Diagnosis not present

## 2015-02-23 DIAGNOSIS — N2581 Secondary hyperparathyroidism of renal origin: Secondary | ICD-10-CM | POA: Diagnosis not present

## 2015-02-23 DIAGNOSIS — D631 Anemia in chronic kidney disease: Secondary | ICD-10-CM | POA: Diagnosis not present

## 2015-02-23 DIAGNOSIS — D509 Iron deficiency anemia, unspecified: Secondary | ICD-10-CM | POA: Diagnosis not present

## 2015-02-26 ENCOUNTER — Ambulatory Visit: Payer: Medicare Other | Admitting: Podiatry

## 2015-02-26 DIAGNOSIS — D631 Anemia in chronic kidney disease: Secondary | ICD-10-CM | POA: Diagnosis not present

## 2015-02-26 DIAGNOSIS — N2581 Secondary hyperparathyroidism of renal origin: Secondary | ICD-10-CM | POA: Diagnosis not present

## 2015-02-26 DIAGNOSIS — N186 End stage renal disease: Secondary | ICD-10-CM | POA: Diagnosis not present

## 2015-02-26 DIAGNOSIS — D509 Iron deficiency anemia, unspecified: Secondary | ICD-10-CM | POA: Diagnosis not present

## 2015-02-28 DIAGNOSIS — N2581 Secondary hyperparathyroidism of renal origin: Secondary | ICD-10-CM | POA: Diagnosis not present

## 2015-02-28 DIAGNOSIS — N186 End stage renal disease: Secondary | ICD-10-CM | POA: Diagnosis not present

## 2015-02-28 DIAGNOSIS — D509 Iron deficiency anemia, unspecified: Secondary | ICD-10-CM | POA: Diagnosis not present

## 2015-02-28 DIAGNOSIS — D631 Anemia in chronic kidney disease: Secondary | ICD-10-CM | POA: Diagnosis not present

## 2015-03-02 DIAGNOSIS — N186 End stage renal disease: Secondary | ICD-10-CM | POA: Diagnosis not present

## 2015-03-02 DIAGNOSIS — D509 Iron deficiency anemia, unspecified: Secondary | ICD-10-CM | POA: Diagnosis not present

## 2015-03-02 DIAGNOSIS — N2581 Secondary hyperparathyroidism of renal origin: Secondary | ICD-10-CM | POA: Diagnosis not present

## 2015-03-02 DIAGNOSIS — D631 Anemia in chronic kidney disease: Secondary | ICD-10-CM | POA: Diagnosis not present

## 2015-03-05 DIAGNOSIS — N186 End stage renal disease: Secondary | ICD-10-CM | POA: Diagnosis not present

## 2015-03-05 DIAGNOSIS — D631 Anemia in chronic kidney disease: Secondary | ICD-10-CM | POA: Diagnosis not present

## 2015-03-05 DIAGNOSIS — N2581 Secondary hyperparathyroidism of renal origin: Secondary | ICD-10-CM | POA: Diagnosis not present

## 2015-03-05 DIAGNOSIS — D509 Iron deficiency anemia, unspecified: Secondary | ICD-10-CM | POA: Diagnosis not present

## 2015-03-07 DIAGNOSIS — N186 End stage renal disease: Secondary | ICD-10-CM | POA: Diagnosis not present

## 2015-03-07 DIAGNOSIS — D631 Anemia in chronic kidney disease: Secondary | ICD-10-CM | POA: Diagnosis not present

## 2015-03-07 DIAGNOSIS — N2581 Secondary hyperparathyroidism of renal origin: Secondary | ICD-10-CM | POA: Diagnosis not present

## 2015-03-07 DIAGNOSIS — D509 Iron deficiency anemia, unspecified: Secondary | ICD-10-CM | POA: Diagnosis not present

## 2015-03-09 DIAGNOSIS — N2581 Secondary hyperparathyroidism of renal origin: Secondary | ICD-10-CM | POA: Diagnosis not present

## 2015-03-09 DIAGNOSIS — D631 Anemia in chronic kidney disease: Secondary | ICD-10-CM | POA: Diagnosis not present

## 2015-03-09 DIAGNOSIS — N186 End stage renal disease: Secondary | ICD-10-CM | POA: Diagnosis not present

## 2015-03-10 DIAGNOSIS — N186 End stage renal disease: Secondary | ICD-10-CM | POA: Diagnosis not present

## 2015-03-10 DIAGNOSIS — D509 Iron deficiency anemia, unspecified: Secondary | ICD-10-CM | POA: Diagnosis not present

## 2015-03-10 DIAGNOSIS — N2581 Secondary hyperparathyroidism of renal origin: Secondary | ICD-10-CM | POA: Diagnosis not present

## 2015-03-10 DIAGNOSIS — D631 Anemia in chronic kidney disease: Secondary | ICD-10-CM | POA: Diagnosis not present

## 2015-03-13 ENCOUNTER — Other Ambulatory Visit: Payer: Self-pay

## 2015-03-13 DIAGNOSIS — D631 Anemia in chronic kidney disease: Secondary | ICD-10-CM | POA: Diagnosis not present

## 2015-03-13 DIAGNOSIS — N2581 Secondary hyperparathyroidism of renal origin: Secondary | ICD-10-CM | POA: Diagnosis not present

## 2015-03-13 DIAGNOSIS — D509 Iron deficiency anemia, unspecified: Secondary | ICD-10-CM | POA: Diagnosis not present

## 2015-03-13 DIAGNOSIS — N186 End stage renal disease: Secondary | ICD-10-CM | POA: Diagnosis not present

## 2015-03-13 MED ORDER — ZOLPIDEM TARTRATE 10 MG PO TABS: 10.0000 mg | ORAL_TABLET | Freq: Every evening | ORAL | Status: DC | PRN

## 2015-03-13 NOTE — Telephone Encounter (Signed)
Rx faxed to pharmacy  

## 2015-03-13 NOTE — Telephone Encounter (Signed)
Done hardcopy to Dahlia  

## 2015-03-15 ENCOUNTER — Ambulatory Visit: Payer: Medicare Other | Admitting: Internal Medicine

## 2015-03-15 DIAGNOSIS — N186 End stage renal disease: Secondary | ICD-10-CM | POA: Diagnosis not present

## 2015-03-15 DIAGNOSIS — D509 Iron deficiency anemia, unspecified: Secondary | ICD-10-CM | POA: Diagnosis not present

## 2015-03-15 DIAGNOSIS — N2581 Secondary hyperparathyroidism of renal origin: Secondary | ICD-10-CM | POA: Diagnosis not present

## 2015-03-15 DIAGNOSIS — D631 Anemia in chronic kidney disease: Secondary | ICD-10-CM | POA: Diagnosis not present

## 2015-03-17 DIAGNOSIS — D509 Iron deficiency anemia, unspecified: Secondary | ICD-10-CM | POA: Diagnosis not present

## 2015-03-17 DIAGNOSIS — N2581 Secondary hyperparathyroidism of renal origin: Secondary | ICD-10-CM | POA: Diagnosis not present

## 2015-03-17 DIAGNOSIS — I120 Hypertensive chronic kidney disease with stage 5 chronic kidney disease or end stage renal disease: Secondary | ICD-10-CM | POA: Diagnosis not present

## 2015-03-17 DIAGNOSIS — D631 Anemia in chronic kidney disease: Secondary | ICD-10-CM | POA: Diagnosis not present

## 2015-03-17 DIAGNOSIS — Z992 Dependence on renal dialysis: Secondary | ICD-10-CM | POA: Diagnosis not present

## 2015-03-17 DIAGNOSIS — N186 End stage renal disease: Secondary | ICD-10-CM | POA: Diagnosis not present

## 2015-03-21 DIAGNOSIS — N2581 Secondary hyperparathyroidism of renal origin: Secondary | ICD-10-CM | POA: Diagnosis not present

## 2015-03-21 DIAGNOSIS — D631 Anemia in chronic kidney disease: Secondary | ICD-10-CM | POA: Diagnosis not present

## 2015-03-21 DIAGNOSIS — N186 End stage renal disease: Secondary | ICD-10-CM | POA: Diagnosis not present

## 2015-03-21 DIAGNOSIS — D509 Iron deficiency anemia, unspecified: Secondary | ICD-10-CM | POA: Diagnosis not present

## 2015-03-23 ENCOUNTER — Encounter: Payer: Self-pay | Admitting: Podiatry

## 2015-03-23 ENCOUNTER — Ambulatory Visit (INDEPENDENT_AMBULATORY_CARE_PROVIDER_SITE_OTHER): Payer: Medicare Other | Admitting: Podiatry

## 2015-03-23 DIAGNOSIS — B351 Tinea unguium: Secondary | ICD-10-CM | POA: Diagnosis not present

## 2015-03-23 DIAGNOSIS — N2581 Secondary hyperparathyroidism of renal origin: Secondary | ICD-10-CM | POA: Diagnosis not present

## 2015-03-23 DIAGNOSIS — D509 Iron deficiency anemia, unspecified: Secondary | ICD-10-CM | POA: Diagnosis not present

## 2015-03-23 DIAGNOSIS — M79676 Pain in unspecified toe(s): Secondary | ICD-10-CM | POA: Diagnosis not present

## 2015-03-23 DIAGNOSIS — D631 Anemia in chronic kidney disease: Secondary | ICD-10-CM | POA: Diagnosis not present

## 2015-03-23 DIAGNOSIS — N186 End stage renal disease: Secondary | ICD-10-CM | POA: Diagnosis not present

## 2015-03-25 NOTE — Progress Notes (Signed)
Patient ID: [name], male   DOB: 1961/01/14, 55 y.o.   MRN: [id number]  Subjective: 55 y.o. returns the office today for painful, elongated, thickened toenails which he is unable to trim himself. Denies any redness or drainage around the nails. Denies any acute changes since last appointment and no new complaints today. Denies any systemic complaints such as fevers, chills, nausea, vomiting.   Objective: AAO 3, NAD DP/PT pulses palpable, CRT less than 3 seconds Protective sensation intact with Simms Weinstein monofilament Nails hypertrophic, dystrophic, elongated, brittle, discolored 10. There is tenderness overlying the nails 1-5 bilaterally. There is no surrounding erythema or drainage along the nail sites. No open lesions or pre-ulcerative lesions are identified. No other areas of tenderness bilateral lower extremities. No overlying edema, erythema, increased warmth. No pain with calf compression, swelling, warmth, erythema.  Assessment: Patient presents with symptomatic onychomycosis  Plan: -Treatment options including alternatives, risks, complications were discussed -Nails sharply debrided 10 without complication/bleeding. -Hallux nails are quite thick- discussed urea cream.  -Discussed daily foot inspection. If there are any changes, to call the office immediately.  -Follow-up in 3 months or sooner if any problems are to arise. In the meantime, encouraged to call the office with any questions, concerns, changes symptoms.  Ovid Curd, DPM

## 2015-03-26 DIAGNOSIS — D509 Iron deficiency anemia, unspecified: Secondary | ICD-10-CM | POA: Diagnosis not present

## 2015-03-26 DIAGNOSIS — D631 Anemia in chronic kidney disease: Secondary | ICD-10-CM | POA: Diagnosis not present

## 2015-03-26 DIAGNOSIS — N186 End stage renal disease: Secondary | ICD-10-CM | POA: Diagnosis not present

## 2015-03-26 DIAGNOSIS — N2581 Secondary hyperparathyroidism of renal origin: Secondary | ICD-10-CM | POA: Diagnosis not present

## 2015-03-28 DIAGNOSIS — D509 Iron deficiency anemia, unspecified: Secondary | ICD-10-CM | POA: Diagnosis not present

## 2015-03-28 DIAGNOSIS — N186 End stage renal disease: Secondary | ICD-10-CM | POA: Diagnosis not present

## 2015-03-28 DIAGNOSIS — N2581 Secondary hyperparathyroidism of renal origin: Secondary | ICD-10-CM | POA: Diagnosis not present

## 2015-03-28 DIAGNOSIS — D631 Anemia in chronic kidney disease: Secondary | ICD-10-CM | POA: Diagnosis not present

## 2015-03-30 ENCOUNTER — Telehealth: Payer: Self-pay | Admitting: Cardiovascular Disease

## 2015-03-30 DIAGNOSIS — N186 End stage renal disease: Secondary | ICD-10-CM | POA: Diagnosis not present

## 2015-03-30 DIAGNOSIS — D509 Iron deficiency anemia, unspecified: Secondary | ICD-10-CM | POA: Diagnosis not present

## 2015-03-30 DIAGNOSIS — N2581 Secondary hyperparathyroidism of renal origin: Secondary | ICD-10-CM | POA: Diagnosis not present

## 2015-03-30 DIAGNOSIS — D631 Anemia in chronic kidney disease: Secondary | ICD-10-CM | POA: Diagnosis not present

## 2015-03-30 NOTE — Telephone Encounter (Signed)
Patient calling for samples of Renexa and Mateo Flow is not seen in patients medication list.  DC hospital notes on 12/07/14 Dr. Imogene Burn discontinued ticagrelor 90 mg tabs I don't see where it has been reordered  Is he to continue on Brilanta 90 mg?

## 2015-03-30 NOTE — Telephone Encounter (Signed)
Medication Samples have been provided to the patient.  Drug name: Renxa 500 mg Qty: #56  LOT: [id number]  Exp.Date: 02/2018  The patient has been instructed regarding the correct time, dose, and frequency of taking this medication, including desired effects and most common side effects.   Eustace Pen 3:26 PM 03/30/2015   Medication Samples have been provided to the patient.  Drug name: Brilanta 90 mg Qty: #8  LOT: [id number]  Exp.Date: 04/2017  The patient has been instructed regarding the correct time, dose, and frequency of taking this medication, including desired effects and most common side effects.  Given only 8 tablets while prescription is being verified with Dr. Darden Amber Saadiya Wilfong 3:28 PM 03/30/2015

## 2015-03-30 NOTE — Telephone Encounter (Signed)
Patient calling the office for samples of medication:   1.  What medication and dosage are you requesting samples for? Brilinta and Ranexa  2.  Are you currently out of this medication? 1 day supply

## 2015-04-02 DIAGNOSIS — D509 Iron deficiency anemia, unspecified: Secondary | ICD-10-CM | POA: Diagnosis not present

## 2015-04-02 DIAGNOSIS — N186 End stage renal disease: Secondary | ICD-10-CM | POA: Diagnosis not present

## 2015-04-02 DIAGNOSIS — N2581 Secondary hyperparathyroidism of renal origin: Secondary | ICD-10-CM | POA: Diagnosis not present

## 2015-04-02 DIAGNOSIS — D631 Anemia in chronic kidney disease: Secondary | ICD-10-CM | POA: Diagnosis not present

## 2015-04-04 DIAGNOSIS — N186 End stage renal disease: Secondary | ICD-10-CM | POA: Diagnosis not present

## 2015-04-04 DIAGNOSIS — D631 Anemia in chronic kidney disease: Secondary | ICD-10-CM | POA: Diagnosis not present

## 2015-04-04 DIAGNOSIS — N2581 Secondary hyperparathyroidism of renal origin: Secondary | ICD-10-CM | POA: Diagnosis not present

## 2015-04-04 DIAGNOSIS — D509 Iron deficiency anemia, unspecified: Secondary | ICD-10-CM | POA: Diagnosis not present

## 2015-04-06 DIAGNOSIS — N2581 Secondary hyperparathyroidism of renal origin: Secondary | ICD-10-CM | POA: Diagnosis not present

## 2015-04-06 DIAGNOSIS — N186 End stage renal disease: Secondary | ICD-10-CM | POA: Diagnosis not present

## 2015-04-06 DIAGNOSIS — D631 Anemia in chronic kidney disease: Secondary | ICD-10-CM | POA: Diagnosis not present

## 2015-04-06 DIAGNOSIS — D509 Iron deficiency anemia, unspecified: Secondary | ICD-10-CM | POA: Diagnosis not present

## 2015-04-09 DIAGNOSIS — N186 End stage renal disease: Secondary | ICD-10-CM | POA: Diagnosis not present

## 2015-04-09 DIAGNOSIS — N2581 Secondary hyperparathyroidism of renal origin: Secondary | ICD-10-CM | POA: Diagnosis not present

## 2015-04-09 DIAGNOSIS — D509 Iron deficiency anemia, unspecified: Secondary | ICD-10-CM | POA: Diagnosis not present

## 2015-04-09 DIAGNOSIS — D631 Anemia in chronic kidney disease: Secondary | ICD-10-CM | POA: Diagnosis not present

## 2015-04-11 DIAGNOSIS — N186 End stage renal disease: Secondary | ICD-10-CM | POA: Diagnosis not present

## 2015-04-11 DIAGNOSIS — D509 Iron deficiency anemia, unspecified: Secondary | ICD-10-CM | POA: Diagnosis not present

## 2015-04-11 DIAGNOSIS — D631 Anemia in chronic kidney disease: Secondary | ICD-10-CM | POA: Diagnosis not present

## 2015-04-11 DIAGNOSIS — N2581 Secondary hyperparathyroidism of renal origin: Secondary | ICD-10-CM | POA: Diagnosis not present

## 2015-04-13 DIAGNOSIS — N186 End stage renal disease: Secondary | ICD-10-CM | POA: Diagnosis not present

## 2015-04-13 DIAGNOSIS — D631 Anemia in chronic kidney disease: Secondary | ICD-10-CM | POA: Diagnosis not present

## 2015-04-13 DIAGNOSIS — D509 Iron deficiency anemia, unspecified: Secondary | ICD-10-CM | POA: Diagnosis not present

## 2015-04-13 DIAGNOSIS — N2581 Secondary hyperparathyroidism of renal origin: Secondary | ICD-10-CM | POA: Diagnosis not present

## 2015-04-16 DIAGNOSIS — N2581 Secondary hyperparathyroidism of renal origin: Secondary | ICD-10-CM | POA: Diagnosis not present

## 2015-04-16 DIAGNOSIS — D631 Anemia in chronic kidney disease: Secondary | ICD-10-CM | POA: Diagnosis not present

## 2015-04-16 DIAGNOSIS — N186 End stage renal disease: Secondary | ICD-10-CM | POA: Diagnosis not present

## 2015-04-16 DIAGNOSIS — D509 Iron deficiency anemia, unspecified: Secondary | ICD-10-CM | POA: Diagnosis not present

## 2015-04-17 DIAGNOSIS — N186 End stage renal disease: Secondary | ICD-10-CM | POA: Diagnosis not present

## 2015-04-17 DIAGNOSIS — I120 Hypertensive chronic kidney disease with stage 5 chronic kidney disease or end stage renal disease: Secondary | ICD-10-CM | POA: Diagnosis not present

## 2015-04-17 DIAGNOSIS — Z992 Dependence on renal dialysis: Secondary | ICD-10-CM | POA: Diagnosis not present

## 2015-04-18 DIAGNOSIS — E8779 Other fluid overload: Secondary | ICD-10-CM | POA: Diagnosis not present

## 2015-04-18 DIAGNOSIS — N186 End stage renal disease: Secondary | ICD-10-CM | POA: Diagnosis not present

## 2015-04-18 DIAGNOSIS — N2581 Secondary hyperparathyroidism of renal origin: Secondary | ICD-10-CM | POA: Diagnosis not present

## 2015-04-18 DIAGNOSIS — D631 Anemia in chronic kidney disease: Secondary | ICD-10-CM | POA: Diagnosis not present

## 2015-04-18 DIAGNOSIS — D509 Iron deficiency anemia, unspecified: Secondary | ICD-10-CM | POA: Diagnosis not present

## 2015-04-18 DIAGNOSIS — Z23 Encounter for immunization: Secondary | ICD-10-CM | POA: Diagnosis not present

## 2015-04-20 DIAGNOSIS — Z23 Encounter for immunization: Secondary | ICD-10-CM | POA: Diagnosis not present

## 2015-04-20 DIAGNOSIS — D631 Anemia in chronic kidney disease: Secondary | ICD-10-CM | POA: Diagnosis not present

## 2015-04-20 DIAGNOSIS — D509 Iron deficiency anemia, unspecified: Secondary | ICD-10-CM | POA: Diagnosis not present

## 2015-04-20 DIAGNOSIS — N2581 Secondary hyperparathyroidism of renal origin: Secondary | ICD-10-CM | POA: Diagnosis not present

## 2015-04-20 DIAGNOSIS — N186 End stage renal disease: Secondary | ICD-10-CM | POA: Diagnosis not present

## 2015-04-20 DIAGNOSIS — E8779 Other fluid overload: Secondary | ICD-10-CM | POA: Diagnosis not present

## 2015-04-23 DIAGNOSIS — D509 Iron deficiency anemia, unspecified: Secondary | ICD-10-CM | POA: Diagnosis not present

## 2015-04-23 DIAGNOSIS — E8779 Other fluid overload: Secondary | ICD-10-CM | POA: Diagnosis not present

## 2015-04-23 DIAGNOSIS — N2581 Secondary hyperparathyroidism of renal origin: Secondary | ICD-10-CM | POA: Diagnosis not present

## 2015-04-23 DIAGNOSIS — D631 Anemia in chronic kidney disease: Secondary | ICD-10-CM | POA: Diagnosis not present

## 2015-04-23 DIAGNOSIS — N186 End stage renal disease: Secondary | ICD-10-CM | POA: Diagnosis not present

## 2015-04-23 DIAGNOSIS — Z23 Encounter for immunization: Secondary | ICD-10-CM | POA: Diagnosis not present

## 2015-04-25 DIAGNOSIS — N186 End stage renal disease: Secondary | ICD-10-CM | POA: Diagnosis not present

## 2015-04-25 DIAGNOSIS — E8779 Other fluid overload: Secondary | ICD-10-CM | POA: Diagnosis not present

## 2015-04-25 DIAGNOSIS — Z23 Encounter for immunization: Secondary | ICD-10-CM | POA: Diagnosis not present

## 2015-04-25 DIAGNOSIS — D631 Anemia in chronic kidney disease: Secondary | ICD-10-CM | POA: Diagnosis not present

## 2015-04-25 DIAGNOSIS — D509 Iron deficiency anemia, unspecified: Secondary | ICD-10-CM | POA: Diagnosis not present

## 2015-04-25 DIAGNOSIS — N2581 Secondary hyperparathyroidism of renal origin: Secondary | ICD-10-CM | POA: Diagnosis not present

## 2015-04-27 DIAGNOSIS — Z23 Encounter for immunization: Secondary | ICD-10-CM | POA: Diagnosis not present

## 2015-04-27 DIAGNOSIS — N2581 Secondary hyperparathyroidism of renal origin: Secondary | ICD-10-CM | POA: Diagnosis not present

## 2015-04-27 DIAGNOSIS — D631 Anemia in chronic kidney disease: Secondary | ICD-10-CM | POA: Diagnosis not present

## 2015-04-27 DIAGNOSIS — E8779 Other fluid overload: Secondary | ICD-10-CM | POA: Diagnosis not present

## 2015-04-27 DIAGNOSIS — D509 Iron deficiency anemia, unspecified: Secondary | ICD-10-CM | POA: Diagnosis not present

## 2015-04-27 DIAGNOSIS — N186 End stage renal disease: Secondary | ICD-10-CM | POA: Diagnosis not present

## 2015-04-30 DIAGNOSIS — D631 Anemia in chronic kidney disease: Secondary | ICD-10-CM | POA: Diagnosis not present

## 2015-04-30 DIAGNOSIS — E8779 Other fluid overload: Secondary | ICD-10-CM | POA: Diagnosis not present

## 2015-04-30 DIAGNOSIS — N186 End stage renal disease: Secondary | ICD-10-CM | POA: Diagnosis not present

## 2015-04-30 DIAGNOSIS — N2581 Secondary hyperparathyroidism of renal origin: Secondary | ICD-10-CM | POA: Diagnosis not present

## 2015-04-30 DIAGNOSIS — D509 Iron deficiency anemia, unspecified: Secondary | ICD-10-CM | POA: Diagnosis not present

## 2015-04-30 DIAGNOSIS — Z23 Encounter for immunization: Secondary | ICD-10-CM | POA: Diagnosis not present

## 2015-05-02 DIAGNOSIS — Z23 Encounter for immunization: Secondary | ICD-10-CM | POA: Diagnosis not present

## 2015-05-02 DIAGNOSIS — D509 Iron deficiency anemia, unspecified: Secondary | ICD-10-CM | POA: Diagnosis not present

## 2015-05-02 DIAGNOSIS — N2581 Secondary hyperparathyroidism of renal origin: Secondary | ICD-10-CM | POA: Diagnosis not present

## 2015-05-02 DIAGNOSIS — D631 Anemia in chronic kidney disease: Secondary | ICD-10-CM | POA: Diagnosis not present

## 2015-05-02 DIAGNOSIS — E8779 Other fluid overload: Secondary | ICD-10-CM | POA: Diagnosis not present

## 2015-05-02 DIAGNOSIS — N186 End stage renal disease: Secondary | ICD-10-CM | POA: Diagnosis not present

## 2015-05-03 DIAGNOSIS — D509 Iron deficiency anemia, unspecified: Secondary | ICD-10-CM | POA: Diagnosis not present

## 2015-05-03 DIAGNOSIS — N186 End stage renal disease: Secondary | ICD-10-CM | POA: Diagnosis not present

## 2015-05-03 DIAGNOSIS — D631 Anemia in chronic kidney disease: Secondary | ICD-10-CM | POA: Diagnosis not present

## 2015-05-03 DIAGNOSIS — E8779 Other fluid overload: Secondary | ICD-10-CM | POA: Diagnosis not present

## 2015-05-03 DIAGNOSIS — N2581 Secondary hyperparathyroidism of renal origin: Secondary | ICD-10-CM | POA: Diagnosis not present

## 2015-05-03 DIAGNOSIS — Z23 Encounter for immunization: Secondary | ICD-10-CM | POA: Diagnosis not present

## 2015-05-04 DIAGNOSIS — Z23 Encounter for immunization: Secondary | ICD-10-CM | POA: Diagnosis not present

## 2015-05-04 DIAGNOSIS — D509 Iron deficiency anemia, unspecified: Secondary | ICD-10-CM | POA: Diagnosis not present

## 2015-05-04 DIAGNOSIS — E8779 Other fluid overload: Secondary | ICD-10-CM | POA: Diagnosis not present

## 2015-05-04 DIAGNOSIS — N2581 Secondary hyperparathyroidism of renal origin: Secondary | ICD-10-CM | POA: Diagnosis not present

## 2015-05-04 DIAGNOSIS — N186 End stage renal disease: Secondary | ICD-10-CM | POA: Diagnosis not present

## 2015-05-04 DIAGNOSIS — D631 Anemia in chronic kidney disease: Secondary | ICD-10-CM | POA: Diagnosis not present

## 2015-05-07 DIAGNOSIS — N2581 Secondary hyperparathyroidism of renal origin: Secondary | ICD-10-CM | POA: Diagnosis not present

## 2015-05-07 DIAGNOSIS — E8779 Other fluid overload: Secondary | ICD-10-CM | POA: Diagnosis not present

## 2015-05-07 DIAGNOSIS — N186 End stage renal disease: Secondary | ICD-10-CM | POA: Diagnosis not present

## 2015-05-07 DIAGNOSIS — D631 Anemia in chronic kidney disease: Secondary | ICD-10-CM | POA: Diagnosis not present

## 2015-05-07 DIAGNOSIS — Z23 Encounter for immunization: Secondary | ICD-10-CM | POA: Diagnosis not present

## 2015-05-07 DIAGNOSIS — D509 Iron deficiency anemia, unspecified: Secondary | ICD-10-CM | POA: Diagnosis not present

## 2015-05-09 DIAGNOSIS — D631 Anemia in chronic kidney disease: Secondary | ICD-10-CM | POA: Diagnosis not present

## 2015-05-09 DIAGNOSIS — N186 End stage renal disease: Secondary | ICD-10-CM | POA: Diagnosis not present

## 2015-05-09 DIAGNOSIS — N2581 Secondary hyperparathyroidism of renal origin: Secondary | ICD-10-CM | POA: Diagnosis not present

## 2015-05-09 DIAGNOSIS — E8779 Other fluid overload: Secondary | ICD-10-CM | POA: Diagnosis not present

## 2015-05-09 DIAGNOSIS — D509 Iron deficiency anemia, unspecified: Secondary | ICD-10-CM | POA: Diagnosis not present

## 2015-05-09 DIAGNOSIS — Z23 Encounter for immunization: Secondary | ICD-10-CM | POA: Diagnosis not present

## 2015-05-11 DIAGNOSIS — D509 Iron deficiency anemia, unspecified: Secondary | ICD-10-CM | POA: Diagnosis not present

## 2015-05-11 DIAGNOSIS — N186 End stage renal disease: Secondary | ICD-10-CM | POA: Diagnosis not present

## 2015-05-11 DIAGNOSIS — E8779 Other fluid overload: Secondary | ICD-10-CM | POA: Diagnosis not present

## 2015-05-11 DIAGNOSIS — Z23 Encounter for immunization: Secondary | ICD-10-CM | POA: Diagnosis not present

## 2015-05-11 DIAGNOSIS — N2581 Secondary hyperparathyroidism of renal origin: Secondary | ICD-10-CM | POA: Diagnosis not present

## 2015-05-11 DIAGNOSIS — D631 Anemia in chronic kidney disease: Secondary | ICD-10-CM | POA: Diagnosis not present

## 2015-05-14 DIAGNOSIS — D509 Iron deficiency anemia, unspecified: Secondary | ICD-10-CM | POA: Diagnosis not present

## 2015-05-14 DIAGNOSIS — D631 Anemia in chronic kidney disease: Secondary | ICD-10-CM | POA: Diagnosis not present

## 2015-05-14 DIAGNOSIS — N2581 Secondary hyperparathyroidism of renal origin: Secondary | ICD-10-CM | POA: Diagnosis not present

## 2015-05-14 DIAGNOSIS — N186 End stage renal disease: Secondary | ICD-10-CM | POA: Diagnosis not present

## 2015-05-14 DIAGNOSIS — E8779 Other fluid overload: Secondary | ICD-10-CM | POA: Diagnosis not present

## 2015-05-14 DIAGNOSIS — Z23 Encounter for immunization: Secondary | ICD-10-CM | POA: Diagnosis not present

## 2015-05-15 DIAGNOSIS — Z992 Dependence on renal dialysis: Secondary | ICD-10-CM | POA: Diagnosis not present

## 2015-05-15 DIAGNOSIS — I120 Hypertensive chronic kidney disease with stage 5 chronic kidney disease or end stage renal disease: Secondary | ICD-10-CM | POA: Diagnosis not present

## 2015-05-15 DIAGNOSIS — N186 End stage renal disease: Secondary | ICD-10-CM | POA: Diagnosis not present

## 2015-05-16 DIAGNOSIS — D631 Anemia in chronic kidney disease: Secondary | ICD-10-CM | POA: Diagnosis not present

## 2015-05-16 DIAGNOSIS — D509 Iron deficiency anemia, unspecified: Secondary | ICD-10-CM | POA: Diagnosis not present

## 2015-05-16 DIAGNOSIS — N2581 Secondary hyperparathyroidism of renal origin: Secondary | ICD-10-CM | POA: Diagnosis not present

## 2015-05-16 DIAGNOSIS — N186 End stage renal disease: Secondary | ICD-10-CM | POA: Diagnosis not present

## 2015-05-18 DIAGNOSIS — D631 Anemia in chronic kidney disease: Secondary | ICD-10-CM | POA: Diagnosis not present

## 2015-05-18 DIAGNOSIS — N186 End stage renal disease: Secondary | ICD-10-CM | POA: Diagnosis not present

## 2015-05-18 DIAGNOSIS — D509 Iron deficiency anemia, unspecified: Secondary | ICD-10-CM | POA: Diagnosis not present

## 2015-05-18 DIAGNOSIS — N2581 Secondary hyperparathyroidism of renal origin: Secondary | ICD-10-CM | POA: Diagnosis not present

## 2015-05-21 DIAGNOSIS — N186 End stage renal disease: Secondary | ICD-10-CM | POA: Diagnosis not present

## 2015-05-21 DIAGNOSIS — D509 Iron deficiency anemia, unspecified: Secondary | ICD-10-CM | POA: Diagnosis not present

## 2015-05-21 DIAGNOSIS — N2581 Secondary hyperparathyroidism of renal origin: Secondary | ICD-10-CM | POA: Diagnosis not present

## 2015-05-21 DIAGNOSIS — D631 Anemia in chronic kidney disease: Secondary | ICD-10-CM | POA: Diagnosis not present

## 2015-05-24 DIAGNOSIS — N2581 Secondary hyperparathyroidism of renal origin: Secondary | ICD-10-CM | POA: Diagnosis not present

## 2015-05-24 DIAGNOSIS — N186 End stage renal disease: Secondary | ICD-10-CM | POA: Diagnosis not present

## 2015-05-26 DIAGNOSIS — N2581 Secondary hyperparathyroidism of renal origin: Secondary | ICD-10-CM | POA: Diagnosis not present

## 2015-05-26 DIAGNOSIS — N186 End stage renal disease: Secondary | ICD-10-CM | POA: Diagnosis not present

## 2015-05-28 DIAGNOSIS — D631 Anemia in chronic kidney disease: Secondary | ICD-10-CM | POA: Diagnosis not present

## 2015-05-28 DIAGNOSIS — D509 Iron deficiency anemia, unspecified: Secondary | ICD-10-CM | POA: Diagnosis not present

## 2015-05-28 DIAGNOSIS — N2581 Secondary hyperparathyroidism of renal origin: Secondary | ICD-10-CM | POA: Diagnosis not present

## 2015-05-28 DIAGNOSIS — N186 End stage renal disease: Secondary | ICD-10-CM | POA: Diagnosis not present

## 2015-05-30 DIAGNOSIS — N186 End stage renal disease: Secondary | ICD-10-CM | POA: Diagnosis not present

## 2015-05-30 DIAGNOSIS — N2581 Secondary hyperparathyroidism of renal origin: Secondary | ICD-10-CM | POA: Diagnosis not present

## 2015-05-30 DIAGNOSIS — D509 Iron deficiency anemia, unspecified: Secondary | ICD-10-CM | POA: Diagnosis not present

## 2015-05-30 DIAGNOSIS — D631 Anemia in chronic kidney disease: Secondary | ICD-10-CM | POA: Diagnosis not present

## 2015-06-01 DIAGNOSIS — N186 End stage renal disease: Secondary | ICD-10-CM | POA: Diagnosis not present

## 2015-06-01 DIAGNOSIS — D631 Anemia in chronic kidney disease: Secondary | ICD-10-CM | POA: Diagnosis not present

## 2015-06-01 DIAGNOSIS — N2581 Secondary hyperparathyroidism of renal origin: Secondary | ICD-10-CM | POA: Diagnosis not present

## 2015-06-01 DIAGNOSIS — D509 Iron deficiency anemia, unspecified: Secondary | ICD-10-CM | POA: Diagnosis not present

## 2015-06-04 DIAGNOSIS — N2581 Secondary hyperparathyroidism of renal origin: Secondary | ICD-10-CM | POA: Diagnosis not present

## 2015-06-04 DIAGNOSIS — D631 Anemia in chronic kidney disease: Secondary | ICD-10-CM | POA: Diagnosis not present

## 2015-06-04 DIAGNOSIS — D509 Iron deficiency anemia, unspecified: Secondary | ICD-10-CM | POA: Diagnosis not present

## 2015-06-04 DIAGNOSIS — N186 End stage renal disease: Secondary | ICD-10-CM | POA: Diagnosis not present

## 2015-06-06 DIAGNOSIS — N186 End stage renal disease: Secondary | ICD-10-CM | POA: Diagnosis not present

## 2015-06-06 DIAGNOSIS — N2581 Secondary hyperparathyroidism of renal origin: Secondary | ICD-10-CM | POA: Diagnosis not present

## 2015-06-06 DIAGNOSIS — D509 Iron deficiency anemia, unspecified: Secondary | ICD-10-CM | POA: Diagnosis not present

## 2015-06-06 DIAGNOSIS — D631 Anemia in chronic kidney disease: Secondary | ICD-10-CM | POA: Diagnosis not present

## 2015-06-08 DIAGNOSIS — N186 End stage renal disease: Secondary | ICD-10-CM | POA: Diagnosis not present

## 2015-06-08 DIAGNOSIS — D509 Iron deficiency anemia, unspecified: Secondary | ICD-10-CM | POA: Diagnosis not present

## 2015-06-08 DIAGNOSIS — D631 Anemia in chronic kidney disease: Secondary | ICD-10-CM | POA: Diagnosis not present

## 2015-06-08 DIAGNOSIS — N2581 Secondary hyperparathyroidism of renal origin: Secondary | ICD-10-CM | POA: Diagnosis not present

## 2015-06-09 DIAGNOSIS — E8770 Fluid overload, unspecified: Secondary | ICD-10-CM | POA: Diagnosis not present

## 2015-06-09 DIAGNOSIS — N186 End stage renal disease: Secondary | ICD-10-CM | POA: Diagnosis not present

## 2015-06-11 DIAGNOSIS — N2581 Secondary hyperparathyroidism of renal origin: Secondary | ICD-10-CM | POA: Diagnosis not present

## 2015-06-11 DIAGNOSIS — D631 Anemia in chronic kidney disease: Secondary | ICD-10-CM | POA: Diagnosis not present

## 2015-06-11 DIAGNOSIS — N186 End stage renal disease: Secondary | ICD-10-CM | POA: Diagnosis not present

## 2015-06-11 DIAGNOSIS — D509 Iron deficiency anemia, unspecified: Secondary | ICD-10-CM | POA: Diagnosis not present

## 2015-06-13 ENCOUNTER — Telehealth: Payer: Self-pay | Admitting: Cardiovascular Disease

## 2015-06-13 NOTE — Telephone Encounter (Signed)
Call from Pa at dialysis center as pt medications were confusing and pt was not taking all medication listed on their list or on our list.  She spoke with pt Pharmacy and pt has not had many of his medication filled in months.  She would like our office to try to have appt with pt to discuss medications. Offered appt with our PharmD to discuss pt medications, she agreed it was worth a try.  She was going to fax over what information she has for the pt and told her I would share with PharmD to call and discuss medications.

## 2015-06-14 NOTE — Telephone Encounter (Signed)
Appointment set

## 2015-06-15 DIAGNOSIS — Z992 Dependence on renal dialysis: Secondary | ICD-10-CM | POA: Diagnosis not present

## 2015-06-15 DIAGNOSIS — I120 Hypertensive chronic kidney disease with stage 5 chronic kidney disease or end stage renal disease: Secondary | ICD-10-CM | POA: Diagnosis not present

## 2015-06-15 DIAGNOSIS — N186 End stage renal disease: Secondary | ICD-10-CM | POA: Diagnosis not present

## 2015-06-16 DIAGNOSIS — D631 Anemia in chronic kidney disease: Secondary | ICD-10-CM | POA: Diagnosis not present

## 2015-06-16 DIAGNOSIS — N186 End stage renal disease: Secondary | ICD-10-CM | POA: Diagnosis not present

## 2015-06-16 DIAGNOSIS — Z23 Encounter for immunization: Secondary | ICD-10-CM | POA: Diagnosis not present

## 2015-06-16 DIAGNOSIS — D509 Iron deficiency anemia, unspecified: Secondary | ICD-10-CM | POA: Diagnosis not present

## 2015-06-16 DIAGNOSIS — N2581 Secondary hyperparathyroidism of renal origin: Secondary | ICD-10-CM | POA: Diagnosis not present

## 2015-06-18 DIAGNOSIS — N186 End stage renal disease: Secondary | ICD-10-CM | POA: Diagnosis not present

## 2015-06-18 DIAGNOSIS — N2581 Secondary hyperparathyroidism of renal origin: Secondary | ICD-10-CM | POA: Diagnosis not present

## 2015-06-18 DIAGNOSIS — D509 Iron deficiency anemia, unspecified: Secondary | ICD-10-CM | POA: Diagnosis not present

## 2015-06-18 DIAGNOSIS — Z23 Encounter for immunization: Secondary | ICD-10-CM | POA: Diagnosis not present

## 2015-06-18 DIAGNOSIS — D631 Anemia in chronic kidney disease: Secondary | ICD-10-CM | POA: Diagnosis not present

## 2015-06-20 DIAGNOSIS — D509 Iron deficiency anemia, unspecified: Secondary | ICD-10-CM | POA: Diagnosis not present

## 2015-06-20 DIAGNOSIS — N2581 Secondary hyperparathyroidism of renal origin: Secondary | ICD-10-CM | POA: Diagnosis not present

## 2015-06-20 DIAGNOSIS — D631 Anemia in chronic kidney disease: Secondary | ICD-10-CM | POA: Diagnosis not present

## 2015-06-20 DIAGNOSIS — Z23 Encounter for immunization: Secondary | ICD-10-CM | POA: Diagnosis not present

## 2015-06-20 DIAGNOSIS — N186 End stage renal disease: Secondary | ICD-10-CM | POA: Diagnosis not present

## 2015-06-22 ENCOUNTER — Encounter: Payer: Self-pay | Admitting: Podiatry

## 2015-06-22 ENCOUNTER — Ambulatory Visit (INDEPENDENT_AMBULATORY_CARE_PROVIDER_SITE_OTHER): Payer: Medicare Other | Admitting: Podiatry

## 2015-06-22 DIAGNOSIS — M79676 Pain in unspecified toe(s): Secondary | ICD-10-CM | POA: Diagnosis not present

## 2015-06-22 DIAGNOSIS — N2581 Secondary hyperparathyroidism of renal origin: Secondary | ICD-10-CM | POA: Diagnosis not present

## 2015-06-22 DIAGNOSIS — B351 Tinea unguium: Secondary | ICD-10-CM

## 2015-06-22 DIAGNOSIS — D509 Iron deficiency anemia, unspecified: Secondary | ICD-10-CM | POA: Diagnosis not present

## 2015-06-22 DIAGNOSIS — N186 End stage renal disease: Secondary | ICD-10-CM | POA: Diagnosis not present

## 2015-06-22 DIAGNOSIS — Z23 Encounter for immunization: Secondary | ICD-10-CM | POA: Diagnosis not present

## 2015-06-22 DIAGNOSIS — D631 Anemia in chronic kidney disease: Secondary | ICD-10-CM | POA: Diagnosis not present

## 2015-06-25 DIAGNOSIS — D631 Anemia in chronic kidney disease: Secondary | ICD-10-CM | POA: Diagnosis not present

## 2015-06-25 DIAGNOSIS — N2581 Secondary hyperparathyroidism of renal origin: Secondary | ICD-10-CM | POA: Diagnosis not present

## 2015-06-25 DIAGNOSIS — D509 Iron deficiency anemia, unspecified: Secondary | ICD-10-CM | POA: Diagnosis not present

## 2015-06-25 DIAGNOSIS — Z23 Encounter for immunization: Secondary | ICD-10-CM | POA: Diagnosis not present

## 2015-06-25 DIAGNOSIS — N186 End stage renal disease: Secondary | ICD-10-CM | POA: Diagnosis not present

## 2015-06-25 NOTE — Progress Notes (Signed)
Patient ID: [name], male   DOB: [birth year], 55 y.o.   MRN: [id number]  Subjective: 55 y.o. returns the office today for painful, elongated, thickened toenails which he is unable to trim himself. Denies any redness or drainage around the nails. Denies any acute changes since last appointment and no new complaints today. Denies any systemic complaints such as fevers, chills, nausea, vomiting.   Objective: AAO 3, NAD DP/PT pulses palpable, CRT less than 3 seconds Protective sensation intact with Simms Weinstein monofilament Nails hypertrophic, dystrophic, elongated, brittle, discolored 10. There is tenderness overlying the nails 1-5 bilaterally. There is no surrounding erythema or drainage along the nail sites. No open lesions or pre-ulcerative lesions are identified. No other areas of tenderness bilateral lower extremities. No overlying edema, erythema, increased warmth. No pain with calf compression, swelling, warmth, erythema.  Assessment: Patient presents with symptomatic onychomycosis  Plan: -Treatment options including alternatives, risks, complications were discussed -Nails sharply debrided 10 without complication/bleeding. -Discussed daily foot inspection. If there are any changes, to call the office immediately.  -Follow-up in 3 months or sooner if any problems are to arise. In the meantime, encouraged to call the office with any questions, concerns, changes symptoms.  Ovid Curd, DPM

## 2015-06-27 DIAGNOSIS — N186 End stage renal disease: Secondary | ICD-10-CM | POA: Diagnosis not present

## 2015-06-27 DIAGNOSIS — Z23 Encounter for immunization: Secondary | ICD-10-CM | POA: Diagnosis not present

## 2015-06-27 DIAGNOSIS — N2581 Secondary hyperparathyroidism of renal origin: Secondary | ICD-10-CM | POA: Diagnosis not present

## 2015-06-27 DIAGNOSIS — D509 Iron deficiency anemia, unspecified: Secondary | ICD-10-CM | POA: Diagnosis not present

## 2015-06-27 DIAGNOSIS — D631 Anemia in chronic kidney disease: Secondary | ICD-10-CM | POA: Diagnosis not present

## 2015-06-29 DIAGNOSIS — D509 Iron deficiency anemia, unspecified: Secondary | ICD-10-CM | POA: Diagnosis not present

## 2015-06-29 DIAGNOSIS — D631 Anemia in chronic kidney disease: Secondary | ICD-10-CM | POA: Diagnosis not present

## 2015-06-29 DIAGNOSIS — Z23 Encounter for immunization: Secondary | ICD-10-CM | POA: Diagnosis not present

## 2015-06-29 DIAGNOSIS — N2581 Secondary hyperparathyroidism of renal origin: Secondary | ICD-10-CM | POA: Diagnosis not present

## 2015-06-29 DIAGNOSIS — N186 End stage renal disease: Secondary | ICD-10-CM | POA: Diagnosis not present

## 2015-07-02 DIAGNOSIS — Z23 Encounter for immunization: Secondary | ICD-10-CM | POA: Diagnosis not present

## 2015-07-02 DIAGNOSIS — D631 Anemia in chronic kidney disease: Secondary | ICD-10-CM | POA: Diagnosis not present

## 2015-07-02 DIAGNOSIS — N186 End stage renal disease: Secondary | ICD-10-CM | POA: Diagnosis not present

## 2015-07-02 DIAGNOSIS — D509 Iron deficiency anemia, unspecified: Secondary | ICD-10-CM | POA: Diagnosis not present

## 2015-07-02 DIAGNOSIS — N2581 Secondary hyperparathyroidism of renal origin: Secondary | ICD-10-CM | POA: Diagnosis not present

## 2015-07-04 DIAGNOSIS — N2581 Secondary hyperparathyroidism of renal origin: Secondary | ICD-10-CM | POA: Diagnosis not present

## 2015-07-04 DIAGNOSIS — D509 Iron deficiency anemia, unspecified: Secondary | ICD-10-CM | POA: Diagnosis not present

## 2015-07-04 DIAGNOSIS — D631 Anemia in chronic kidney disease: Secondary | ICD-10-CM | POA: Diagnosis not present

## 2015-07-04 DIAGNOSIS — Z23 Encounter for immunization: Secondary | ICD-10-CM | POA: Diagnosis not present

## 2015-07-04 DIAGNOSIS — N186 End stage renal disease: Secondary | ICD-10-CM | POA: Diagnosis not present

## 2015-07-05 ENCOUNTER — Encounter: Payer: Self-pay | Admitting: Pharmacist Clinician (PhC)/ Clinical Pharmacy Specialist

## 2015-07-05 ENCOUNTER — Ambulatory Visit (INDEPENDENT_AMBULATORY_CARE_PROVIDER_SITE_OTHER): Payer: Medicare Other | Admitting: Pharmacist Clinician (PhC)/ Clinical Pharmacy Specialist

## 2015-07-05 VITALS — BP 134/86 | HR 76 | Ht 72.0 in | Wt 231.0 lb

## 2015-07-05 DIAGNOSIS — I208 Other forms of angina pectoris: Secondary | ICD-10-CM

## 2015-07-05 DIAGNOSIS — E785 Hyperlipidemia, unspecified: Secondary | ICD-10-CM

## 2015-07-05 DIAGNOSIS — I10 Essential (primary) hypertension: Secondary | ICD-10-CM | POA: Diagnosis not present

## 2015-07-05 MED ORDER — AMLODIPINE BESYLATE 5 MG PO TABS: 5.0000 mg | ORAL_TABLET | Freq: Every day | ORAL | Status: DC

## 2015-07-05 MED ORDER — ISOSORBIDE MONONITRATE ER 60 MG PO TB24: ORAL_TABLET | ORAL | Status: DC

## 2015-07-05 MED ORDER — ATORVASTATIN CALCIUM 20 MG PO TABS
20.0000 mg | ORAL_TABLET | Freq: Every day | ORAL | Status: DC
Start: 2015-07-05 — End: 2016-10-14

## 2015-07-05 NOTE — Patient Instructions (Signed)
Stop hydralazine.    Start amlodipine 5 mg once daily.  Restart clonidine 0.2 mg twice daily and metoprolol 50 mg twice daily  Restart isosorbide mono - will give you 60 mg tablet, take 1/2 tablet twice daily  Continue with Brilinta for now.

## 2015-07-05 NOTE — Assessment & Plan Note (Signed)
Patient taking Ranexa samples, has not had isosorbide in about 2 weeks.  He reports no problems with chest pain or SOB.  I am going to have him stop Ranexa, once the samples run out, and get him back on isosorbide.  To save money, will give him 60 mg tab to break in half and take 30 mg bid.  Asked that he watch for any change in feelings of chest pain, but as he has been fine for 2 weeks without the isosorbide, he will hopefully continue to do well with just isosorbide.

## 2015-07-05 NOTE — Assessment & Plan Note (Addendum)
Last labs are October 2015 and show LDL good at 89.  Because he cannot afford rosuvastatin 20 mg, I am going to switch him to atorvastatin 20 mg and see if the copay will be lower. His chart indicates a prior allergy to atorvastatin (weakness), but will re-challenge.  He is to call if copay is not affordable.

## 2015-07-05 NOTE — Assessment & Plan Note (Addendum)
Despite not having any antihypertensive medications in the past 2 weeks, his pressure today in the office is 134/86.  He does admit it goes much higher at dialysis visits.  Because of his difficulty with affording medications, I am going to have him stop the hydralazine and give him amlodipine 5 mg daily (hydralazine is more expensive even as generic).  He will try to get the clonidine and metoprolol refilled.  He needs to schedule an appointment with Dr. Allyson Sabal as part of his process to get on kidney transplant list.  I will check in with him at that time to see if he is able to get medications.

## 2015-07-05 NOTE — Progress Notes (Signed)
07/05/2015 [name] 01/15/1961 [id number]   HPI:  [name] is a 55 y.o. male patient of Dr [name], with a PMH below who presents today for a medication review.   There has been much confusion over the past month or two as to what medications he takes.  Dialysis center had a list somewhat different from Epic, and neither match well with what has been picked up at [facility] pharmacy.  I have those records for review today.  He is on Medicare and speaks of copays for his medications, but I don't have record of which Part D plan he is on, only his Medicare card was scanned into the system.  Today he tells me that he has trouble finding the money to pay for his medications and has been without metoprolol, hydralazine, terazosin, isosorbide, rosuvastatin and clonidine, all for about 2 weeks.  He also reports elevated blood pressures at dialysis, into the 180-190's with diastolic readings sometimes over 100.  Of his meds, the only ones he is currently taking regularly are the brand name products for which he has been able to get samples.    His cardiac history is significant for CABG x 4 in 2013, hypertension, hyperlipidemia, CKD on hemodialysis.  He has had problems with ischemia for which he has been prescribed isosorbide and Ranexa.    Hyperlipidemia - on rosuvastatin 20, (out x 2 weeks)  Hypertension - on metoprolol 50 mg bid, clonidine 0.2 mg bid, hydralazine 10 mg bid, terazosin 10 mg qhs (he has been out of all of these for at least 2 weeks)  Mild anteroapical ischemia noted on Myoview (06-2013) - on isosorbide mono 30 mg bid, Ranexa 500 mg bid, has NTG 0.4 mg for CP, states uses occasionally (out of isosorbide and NTG x 2 weeks)  Wt Readings from Last 3 Encounters:  07/05/15 231 lb (104.781 kg)  01/12/15 217 lb (98.431 kg)  12/06/14 224 lb (101.606 kg)   BP Readings from Last 3 Encounters:  07/05/15 134/86  01/12/15 128/87  12/07/14 103/64   Pulse Readings from Last 3  Encounters:  07/05/15 76  01/12/15 97  12/07/14 63    Current Outpatient Prescriptions  Medication Sig Dispense Refill  . aspirin EC 81 MG EC tablet Take 1 tablet (81 mg total) by mouth daily.    . febuxostat (ULORIC) 40 MG tablet Take 40 mg by mouth daily.     Marland Kitchen lanthanum ([name]) 1000 MG chewable tablet Chew 2,000 mg by mouth 3 (three) times daily with meals.    . lidocaine-prilocaine (EMLA) cream Apply 1 application topically as needed. Apply as needed for dialysis site    . ranolazine (RANEXA) 500 MG 12 hr tablet Take 1 tablet (500 mg total) by mouth daily. 30 tablet 0  . ticagrelor (BRILINTA) 90 MG TABS tablet Take 90 mg by mouth 2 (two) times daily.    Marland Kitchen acetaminophen (TYLENOL) 500 MG tablet Take 1,000 mg by mouth every 6 (six) hours as needed (pain).    . cinacalcet (SENSIPAR) 30 MG tablet Take 30 mg by mouth at bedtime. Reported on 07/05/2015    . cloNIDine (CATAPRES) 0.2 MG tablet Take 1 tablet (0.2 mg total) by mouth 2 (two) times daily. (Patient not taking: Reported on 07/05/2015) 60 tablet 11  . hydrALAZINE (APRESOLINE) 10 MG tablet Take 10 mg by mouth 2 (two) times daily.    . isosorbide mononitrate (IMDUR) 30 MG 24 hr tablet TAKE 1 TABLET BY MOUTH  DAILY. (Patient not taking: Reported on 07/05/2015) 30 tablet 8  . metoprolol (LOPRESSOR) 50 MG tablet Take 1 tablet (50 mg total) by mouth 2 (two) times daily. (Patient not taking: Reported on 07/05/2015) 180 tablet 3  . NITROSTAT 0.4 MG SL tablet PLACE 1 TABLET UNDER TONGUE AS NEEDED FOR CHEST PAIN EVERY 5 MINUTES (MAX 3DOSES) (Patient not taking: Reported on 07/05/2015) 25 tablet 4  . rosuvastatin (CRESTOR) 20 MG tablet Take 1 tablet (20 mg total) by mouth every evening. (Patient not taking: Reported on 07/05/2015) 90 tablet 3  . terazosin (HYTRIN) 10 MG capsule TAKE 1 CAPSULE BY MOUTH AT BEDTIME. (Patient not taking: Reported on 07/05/2015) 90 capsule 2  . zolpidem (AMBIEN) 10 MG tablet Take 1 tablet (10 mg total) by mouth at bedtime as  needed for sleep. (Patient not taking: Reported on 07/05/2015) 30 tablet 5   No current facility-administered medications for this visit.    Allergies  Allergen Reactions  . Shrimp [Shellfish Allergy] Shortness Of Breath  . Insulins Other (See Comments)    Patient unsure as to the kind of insulin but states that it has previously caused seizures. This occurred in the hospital in 2006; but in most recent hospitalization (Jan 2013) received insulin but did not have reaction  . Atorvastatin Other (See Comments)    weakness  . Simvastatin Other (See Comments)     abnormal liver tests  . Ultram [Tramadol] Other (See Comments)    Liver enzyme     Past Medical History  Diagnosis Date  . ACHALASIA   . CEREBROVASCULAR ACCIDENT, HX OF   . Headache(784.0)   . HYPERLIPIDEMIA     takes crestor daily  . RENAL CALCULUS, HX OF   . H/O hiatal hernia   . CAD (coronary artery disease), 3 vessel significant disease.    . Impaired glucose tolerance   . S/P PTCA (percutaneous transluminal coronary angioplasty), 06/30/11 07/01/2011  . History of diastolic dysfunction, grade 2 by echo   . S/P coronary artery stent placement, PTCA/DES Resolute to mid-LAD via the LIMA graft, and PTCA of apical 95% stenosis 07/05/11 07/04/2011  . Seizures (HCC)     INSULIN INDUCED  . ANEMIA-NOS   . NSTEMI (non-ST elevated myocardial infarction), 06/30/11 07/01/2011  . DEGENERATIVE JOINT DISEASE, SHOULDER     knee  . Gout     takes Uloric daily  . GERD     no meds required  . History of colon polyps   . HYPERTENSION     takes Norvasc,Catapress,Hydralazine,Imdur,and Metoprolol daily  . Insomnia     takes Ambien nightly  . Hyperlipidemia   . Family history of adverse reaction to anesthesia     Mother, hard to awaken  . DEPRESSION     2006- not depressed any longer  . Constipation   . Stroke (HCC)     93/2005/2006/2007;left sided weakness.  1993 12/2013  . Sleep apnea     SLEEP STUDY IN [place] , NO MACHINE YET  1  YEAR  . RENAL INSUFFICIENCY     12/2013- TThSat  . CKD (chronic kidney disease) stage 4, GFR 15-29 ml/min (HCC)     Blood pressure 134/86, pulse 76, height 6' (1.829 m), weight 231 lb (104.781 kg).    Phillips Hay PharmD CPP [facility]

## 2015-07-05 NOTE — Assessment & Plan Note (Signed)
Patient cannot afford Brilinta 90 mg bid and has been getting by on samples.  I don't have any today and cannot determine from the chart why he was switched off clopidogrel.  Will confirm with Dr. Allyson Sabal that we can switch back to clopidogrel 75 mg qd.

## 2015-07-06 DIAGNOSIS — D631 Anemia in chronic kidney disease: Secondary | ICD-10-CM | POA: Diagnosis not present

## 2015-07-06 DIAGNOSIS — D509 Iron deficiency anemia, unspecified: Secondary | ICD-10-CM | POA: Diagnosis not present

## 2015-07-06 DIAGNOSIS — Z23 Encounter for immunization: Secondary | ICD-10-CM | POA: Diagnosis not present

## 2015-07-06 DIAGNOSIS — N186 End stage renal disease: Secondary | ICD-10-CM | POA: Diagnosis not present

## 2015-07-06 DIAGNOSIS — N2581 Secondary hyperparathyroidism of renal origin: Secondary | ICD-10-CM | POA: Diagnosis not present

## 2015-07-09 DIAGNOSIS — N2581 Secondary hyperparathyroidism of renal origin: Secondary | ICD-10-CM | POA: Diagnosis not present

## 2015-07-09 DIAGNOSIS — D509 Iron deficiency anemia, unspecified: Secondary | ICD-10-CM | POA: Diagnosis not present

## 2015-07-09 DIAGNOSIS — Z23 Encounter for immunization: Secondary | ICD-10-CM | POA: Diagnosis not present

## 2015-07-09 DIAGNOSIS — N186 End stage renal disease: Secondary | ICD-10-CM | POA: Diagnosis not present

## 2015-07-09 DIAGNOSIS — D631 Anemia in chronic kidney disease: Secondary | ICD-10-CM | POA: Diagnosis not present

## 2015-07-11 DIAGNOSIS — N2581 Secondary hyperparathyroidism of renal origin: Secondary | ICD-10-CM | POA: Diagnosis not present

## 2015-07-11 DIAGNOSIS — D631 Anemia in chronic kidney disease: Secondary | ICD-10-CM | POA: Diagnosis not present

## 2015-07-11 DIAGNOSIS — N186 End stage renal disease: Secondary | ICD-10-CM | POA: Diagnosis not present

## 2015-07-11 DIAGNOSIS — Z23 Encounter for immunization: Secondary | ICD-10-CM | POA: Diagnosis not present

## 2015-07-11 DIAGNOSIS — D509 Iron deficiency anemia, unspecified: Secondary | ICD-10-CM | POA: Diagnosis not present

## 2015-07-12 DIAGNOSIS — D631 Anemia in chronic kidney disease: Secondary | ICD-10-CM | POA: Diagnosis not present

## 2015-07-12 DIAGNOSIS — D509 Iron deficiency anemia, unspecified: Secondary | ICD-10-CM | POA: Diagnosis not present

## 2015-07-12 DIAGNOSIS — N186 End stage renal disease: Secondary | ICD-10-CM | POA: Diagnosis not present

## 2015-07-12 DIAGNOSIS — N2581 Secondary hyperparathyroidism of renal origin: Secondary | ICD-10-CM | POA: Diagnosis not present

## 2015-07-12 DIAGNOSIS — Z23 Encounter for immunization: Secondary | ICD-10-CM | POA: Diagnosis not present

## 2015-07-13 ENCOUNTER — Other Ambulatory Visit: Payer: Self-pay | Admitting: Internal Medicine

## 2015-07-15 DIAGNOSIS — I120 Hypertensive chronic kidney disease with stage 5 chronic kidney disease or end stage renal disease: Secondary | ICD-10-CM | POA: Diagnosis not present

## 2015-07-15 DIAGNOSIS — N186 End stage renal disease: Secondary | ICD-10-CM | POA: Diagnosis not present

## 2015-07-15 DIAGNOSIS — Z992 Dependence on renal dialysis: Secondary | ICD-10-CM | POA: Diagnosis not present

## 2015-07-16 DIAGNOSIS — I129 Hypertensive chronic kidney disease with stage 1 through stage 4 chronic kidney disease, or unspecified chronic kidney disease: Secondary | ICD-10-CM | POA: Diagnosis not present

## 2015-07-16 DIAGNOSIS — N186 End stage renal disease: Secondary | ICD-10-CM | POA: Diagnosis not present

## 2015-07-16 DIAGNOSIS — N2581 Secondary hyperparathyroidism of renal origin: Secondary | ICD-10-CM | POA: Diagnosis not present

## 2015-07-16 DIAGNOSIS — D509 Iron deficiency anemia, unspecified: Secondary | ICD-10-CM | POA: Diagnosis not present

## 2015-07-16 DIAGNOSIS — D631 Anemia in chronic kidney disease: Secondary | ICD-10-CM | POA: Diagnosis not present

## 2015-07-17 ENCOUNTER — Encounter: Payer: Self-pay | Admitting: Internal Medicine

## 2015-07-17 ENCOUNTER — Other Ambulatory Visit: Payer: Medicare Other

## 2015-07-17 ENCOUNTER — Other Ambulatory Visit: Payer: Self-pay

## 2015-07-17 ENCOUNTER — Ambulatory Visit (INDEPENDENT_AMBULATORY_CARE_PROVIDER_SITE_OTHER): Payer: Medicare Other | Admitting: Internal Medicine

## 2015-07-17 ENCOUNTER — Other Ambulatory Visit (INDEPENDENT_AMBULATORY_CARE_PROVIDER_SITE_OTHER): Payer: Medicare Other

## 2015-07-17 ENCOUNTER — Other Ambulatory Visit: Payer: Self-pay | Admitting: Pharmacist Clinician (PhC)/ Clinical Pharmacy Specialist

## 2015-07-17 VITALS — BP 160/100 | HR 88 | Temp 98.6°F | Ht 72.0 in | Wt 233.0 lb

## 2015-07-17 DIAGNOSIS — Z113 Encounter for screening for infections with a predominantly sexual mode of transmission: Secondary | ICD-10-CM

## 2015-07-17 DIAGNOSIS — Z202 Contact with and (suspected) exposure to infections with a predominantly sexual mode of transmission: Secondary | ICD-10-CM

## 2015-07-17 DIAGNOSIS — Z7251 High risk heterosexual behavior: Secondary | ICD-10-CM

## 2015-07-17 DIAGNOSIS — Z114 Encounter for screening for human immunodeficiency virus [HIV]: Secondary | ICD-10-CM

## 2015-07-17 DIAGNOSIS — A64 Unspecified sexually transmitted disease: Secondary | ICD-10-CM | POA: Diagnosis not present

## 2015-07-17 DIAGNOSIS — Z717 Human immunodeficiency virus [HIV] counseling: Secondary | ICD-10-CM | POA: Diagnosis not present

## 2015-07-17 DIAGNOSIS — N342 Other urethritis: Secondary | ICD-10-CM

## 2015-07-17 DIAGNOSIS — I208 Other forms of angina pectoris: Secondary | ICD-10-CM

## 2015-07-17 LAB — CBC WITH DIFFERENTIAL/PLATELET
BASOS ABS: 0.1 10*3/uL (ref 0.0–0.1)
Basophils Relative: 0.5 % (ref 0.0–3.0)
Eosinophils Absolute: 0.2 10*3/uL (ref 0.0–0.7)
Eosinophils Relative: 2.1 % (ref 0.0–5.0)
HCT: 36.1 % — ABNORMAL LOW (ref 39.0–52.0)
Hemoglobin: 12.2 g/dL — ABNORMAL LOW (ref 13.0–17.0)
LYMPHS ABS: 2.1 10*3/uL (ref 0.7–4.0)
Lymphocytes Relative: 20.9 % (ref 12.0–46.0)
MCHC: 33.8 g/dL (ref 30.0–36.0)
MCV: 97.4 fl (ref 78.0–100.0)
MONO ABS: 1.2 10*3/uL — AB (ref 0.1–1.0)
MONOS PCT: 12.4 % — AB (ref 3.0–12.0)
NEUTROS ABS: 6.3 10*3/uL (ref 1.4–7.7)
NEUTROS PCT: 64.1 % (ref 43.0–77.0)
Platelets: 292 10*3/uL (ref 150.0–400.0)
RBC: 3.71 Mil/uL — ABNORMAL LOW (ref 4.22–5.81)
RDW: 15.3 % (ref 11.5–15.5)
WBC: 9.8 10*3/uL (ref 4.0–10.5)

## 2015-07-17 MED ORDER — ZOLPIDEM TARTRATE 10 MG PO TABS: 10.0000 mg | ORAL_TABLET | Freq: Every evening | ORAL | Status: DC | PRN

## 2015-07-17 MED ORDER — CLOPIDOGREL BISULFATE 75 MG PO TABS: 75.0000 mg | ORAL_TABLET | Freq: Every day | ORAL | Status: DC

## 2015-07-17 MED ORDER — DOXYCYCLINE HYCLATE 100 MG PO TABS: 100.0000 mg | ORAL_TABLET | Freq: Two times a day (BID) | ORAL | Status: DC

## 2015-07-17 MED ORDER — CEFTRIAXONE SODIUM 500 MG IJ SOLR
250.0000 mg | Freq: Once | INTRAMUSCULAR | Status: AC
  Administered 2015-07-17: 250 mg via INTRAMUSCULAR

## 2015-07-17 NOTE — Progress Notes (Signed)
Subjective:  Patient ID: [name], male    DOB: 11-16-1960  Age: 55 y.o. MRN: [id number]  CC: No chief complaint on file.   HPI [name] presents for yellow penile d/c. Last casual sexual encounter in Nov - unprotected  Outpatient Prescriptions Prior to Visit  Medication Sig Dispense Refill  . acetaminophen (TYLENOL) 500 MG tablet Take 1,000 mg by mouth every 6 (six) hours as needed (pain).    Marland Kitchen amLODipine (NORVASC) 5 MG tablet Take 1 tablet (5 mg total) by mouth daily. 30 tablet 5  . aspirin EC 81 MG EC tablet Take 1 tablet (81 mg total) by mouth daily.    Marland Kitchen atorvastatin (LIPITOR) 20 MG tablet Take 1 tablet (20 mg total) by mouth daily. 30 tablet 1  . cinacalcet (SENSIPAR) 30 MG tablet Take 60 mg by mouth at bedtime. Reported on 07/05/2015    . cloNIDine (CATAPRES) 0.2 MG tablet Take 1 tablet (0.2 mg total) by mouth 2 (two) times daily. 60 tablet 11  . clopidogrel (PLAVIX) 75 MG tablet Take 1 tablet (75 mg total) by mouth daily. 90 tablet 1  . febuxostat (ULORIC) 40 MG tablet Take 40 mg by mouth daily.     . isosorbide mononitrate (IMDUR) 60 MG 24 hr tablet Take 1/2 tablet twice daily 90 tablet 3  . lanthanum ([name]) 1000 MG chewable tablet Chew 2,000 mg by mouth 3 (three) times daily with meals.    . lidocaine-prilocaine (EMLA) cream Apply 1 application topically as needed. Apply as needed for dialysis site    . metoprolol (LOPRESSOR) 50 MG tablet Take 1 tablet (50 mg total) by mouth 2 (two) times daily. 180 tablet 3  . NITROSTAT 0.4 MG SL tablet PLACE 1 TABLET UNDER TONGUE AS NEEDED FOR CHEST PAIN EVERY 5 MINUTES (MAX 3DOSES) 25 tablet 4  . terazosin (HYTRIN) 10 MG capsule TAKE 1 CAPSULE BY MOUTH DAILY AT BEDTIME 90 capsule 2  . ticagrelor (BRILINTA) 90 MG TABS tablet Take 90 mg by mouth 2 (two) times daily.    Marland Kitchen zolpidem (AMBIEN) 10 MG tablet Take 1 tablet (10 mg total) by mouth at bedtime as needed for sleep. 30 tablet 5   No facility-administered medications prior  to visit.    ROS Review of Systems  Constitutional: Negative for appetite change, fatigue and unexpected weight change.  HENT: Negative for congestion, nosebleeds, sneezing, sore throat and trouble swallowing.   Eyes: Negative for itching and visual disturbance.  Respiratory: Negative for cough.   Cardiovascular: Negative for chest pain, palpitations and leg swelling.  Gastrointestinal: Negative for nausea, diarrhea, blood in stool and abdominal distention.  Genitourinary: Positive for discharge. Negative for dysuria, urgency, frequency, hematuria, decreased urine volume, genital sores, penile pain and testicular pain.  Musculoskeletal: Negative for back pain, joint swelling, gait problem and neck pain.  Skin: Negative for rash and wound.  Neurological: Negative for dizziness, tremors, speech difficulty and weakness.  Psychiatric/Behavioral: Negative for sleep disturbance, dysphoric mood and agitation. The patient is not nervous/anxious.     Objective:  BP 160/100 mmHg  Pulse 88  Temp(Src) 98.6 F (37 C) (Oral)  Ht 6' (1.829 m)  Wt 233 lb (105.688 kg)  BMI 31.59 kg/m2  SpO2 99%  BP Readings from Last 3 Encounters:  07/17/15 160/100  07/05/15 134/86  01/12/15 128/87    Wt Readings from Last 3 Encounters:  07/17/15 233 lb (105.688 kg)  07/05/15 231 lb (104.781 kg)  01/12/15 217 lb (98.431 kg)  Physical Exam  Constitutional: He is oriented to person, place, and time. He appears well-developed. No distress.  NAD  HENT:  Mouth/Throat: Oropharynx is clear and moist.  Eyes: Conjunctivae are normal. Pupils are equal, round, and reactive to light.  Neck: Normal range of motion. No JVD present. No thyromegaly present.  Cardiovascular: Normal rate, regular rhythm, normal heart sounds and intact distal pulses.  Exam reveals no gallop and no friction rub.   No murmur heard. Pulmonary/Chest: Effort normal and breath sounds normal. No respiratory distress. He has no wheezes. He  has no rales. He exhibits no tenderness.  Abdominal: Soft. Bowel sounds are normal. He exhibits no distension and no mass. There is no tenderness. There is no rebound and no guarding.  Musculoskeletal: Normal range of motion. He exhibits no edema or tenderness.  Lymphadenopathy:    He has no cervical adenopathy.  Neurological: He is alert and oriented to person, place, and time. He has normal reflexes. No cranial nerve deficit. He exhibits normal muscle tone. He displays a negative Romberg sign. Coordination and gait normal.  Skin: Skin is warm and dry. No rash noted.  Psychiatric: He has a normal mood and affect. His behavior is normal. Judgment and thought content normal.    Lab Results  Component Value Date   WBC 6.4 06/08/2014   HGB 10.5* 12/06/2014   HCT 31.0* 12/06/2014   PLT 271 06/08/2014   GLUCOSE 104* 12/06/2014   CHOL 157 12/30/2013   TRIG 107 12/30/2013   HDL 47 12/30/2013   LDLDIRECT 118.1 11/10/2012   LDLCALC 89 12/30/2013   ALT 11 12/29/2013   AST 32 12/29/2013   NA 137 12/06/2014   K 3.9 12/06/2014   CL 93* 06/08/2014   CREATININE 7.69* 06/08/2014   BUN 21 06/08/2014   CO2 24 06/08/2014   TSH 1.14 11/09/2013   PSA 3.78 10/06/2014   INR 1.27 12/29/2013   HGBA1C 5.6 12/30/2013    No results found.  Assessment & Plan:   There are no diagnoses linked to this encounter. I am having Mr. Dormer maintain his febuxostat, aspirin, NITROSTAT, acetaminophen, cinacalcet, lanthanum, cloNIDine, metoprolol, zolpidem, ticagrelor, lidocaine-prilocaine, amLODipine, isosorbide mononitrate, atorvastatin, terazosin, and clopidogrel.  No orders of the defined types were placed in this encounter.     Follow-up: No Follow-up on file.  Sonda Primes, MD

## 2015-07-17 NOTE — Telephone Encounter (Signed)
Pt in office for OV for acute issue and is requesting a rf for Ambien. Please advise.

## 2015-07-17 NOTE — Patient Instructions (Signed)
Safe Sex  Safe sex is about reducing the risk of giving or getting a sexually transmitted disease (STD). STDs are spread through sexual contact involving the genitals, mouth, or rectum. Some STDs can be cured and others cannot. Safe sex can also prevent unintended pregnancies.   WHAT ARE SOME SAFE SEX PRACTICES?  · Limit your sexual activity to only one partner who is having sex with only you.  · Talk to your partner about his or her past partners, past STDs, and drug use.  · Use a condom every time you have sexual intercourse. This includes vaginal, oral, and anal sexual activity. Both females and males should wear condoms during oral sex. Only use latex or polyurethane condoms and water-based lubricants. Using petroleum-based lubricants or oils to lubricate a condom will weaken the condom and increase the chance that it will break. The condom should be in place from the beginning to the end of sexual activity. Wearing a condom reduces, but does not completely eliminate, your risk of getting or giving an STD. STDs can be spread by contact with infected body fluids and skin.  · Get vaccinated for hepatitis B and HPV.  · Avoid alcohol and recreational drugs, which can affect your judgment. You may forget to use a condom or participate in high-risk sex.  · For females, avoid douching after sexual intercourse. Douching can spread an infection farther into the reproductive tract.  · Check your body for signs of sores, blisters, rashes, or unusual discharge. See your health care provider if you notice any of these signs.  · Avoid sexual contact if you have symptoms of an infection or are being treated for an STD. If you or your partner has herpes, avoid sexual contact when blisters are present. Use condoms at all other times.  · If you are at risk of being infected with HIV, it is recommended that you take a prescription medicine daily to prevent HIV infection. This is called pre-exposure prophylaxis (PrEP). You are  considered at risk if:    You are a man who has sex with other men (MSM).    You are a heterosexual man or woman who is sexually active with more than one partner.    You take drugs by injection.    You are sexually active with a partner who has HIV.  · Talk with your health care provider about whether you are at high risk of being infected with HIV. If you choose to begin PrEP, you should first be tested for HIV. You should then be tested every 3 months for as long as you are taking PrEP.  · See your health care provider for regular screenings, exams, and tests for other STDs. Before having sex with a new partner, each of you should be screened for STDs and should talk about the results with each other.  WHAT ARE THE BENEFITS OF SAFE SEX?   · There is less chance of getting or giving an STD.  · You can prevent unwanted or unintended pregnancies.  · By discussing safe sex concerns with your partner, you may increase feelings of intimacy, comfort, trust, and honesty between the two of you.     This information is not intended to replace advice given to you by your health care provider. Make sure you discuss any questions you have with your health care provider.     Document Released: 04/10/2004 Document Revised: 03/24/2014 Document Reviewed: 08/25/2011  Elsevier Interactive Patient Education ©2016 [employer]

## 2015-07-17 NOTE — Telephone Encounter (Signed)
In previous medication review discussed switching from Brilinta to Clopidogrel due to costs.  LMOM for patient to call so we can review switching medications.  Will have him take 150 mg x 1, 12 hours after last Brilinta dose, then continue with 75 mg daily.

## 2015-07-17 NOTE — Progress Notes (Signed)
Pre visit review using our clinic review tool, if applicable. No additional management support is needed unless otherwise documented below in the visit note. 

## 2015-07-17 NOTE — Telephone Encounter (Signed)
Done hardcopy to Corinne  

## 2015-07-17 NOTE — Assessment & Plan Note (Addendum)
5/17 new ?etiology Yellow penile d/c. Last casual sexual encounter in Nov - unprotected Labs PO abx - Doxy Ceftriaxone 250 mg IM

## 2015-07-18 LAB — GC/CHLAMYDIA PROBE AMP
CT PROBE, AMP APTIMA: NOT DETECTED
GC Probe RNA: NOT DETECTED

## 2015-07-18 LAB — RPR

## 2015-07-18 NOTE — Telephone Encounter (Signed)
Medication sent to pharmacy  

## 2015-07-19 DIAGNOSIS — D509 Iron deficiency anemia, unspecified: Secondary | ICD-10-CM | POA: Diagnosis not present

## 2015-07-19 DIAGNOSIS — I129 Hypertensive chronic kidney disease with stage 1 through stage 4 chronic kidney disease, or unspecified chronic kidney disease: Secondary | ICD-10-CM | POA: Diagnosis not present

## 2015-07-19 DIAGNOSIS — D631 Anemia in chronic kidney disease: Secondary | ICD-10-CM | POA: Diagnosis not present

## 2015-07-19 DIAGNOSIS — N2581 Secondary hyperparathyroidism of renal origin: Secondary | ICD-10-CM | POA: Diagnosis not present

## 2015-07-19 DIAGNOSIS — N186 End stage renal disease: Secondary | ICD-10-CM | POA: Diagnosis not present

## 2015-07-20 DIAGNOSIS — N2581 Secondary hyperparathyroidism of renal origin: Secondary | ICD-10-CM | POA: Diagnosis not present

## 2015-07-20 DIAGNOSIS — I129 Hypertensive chronic kidney disease with stage 1 through stage 4 chronic kidney disease, or unspecified chronic kidney disease: Secondary | ICD-10-CM | POA: Diagnosis not present

## 2015-07-20 DIAGNOSIS — D509 Iron deficiency anemia, unspecified: Secondary | ICD-10-CM | POA: Diagnosis not present

## 2015-07-20 DIAGNOSIS — N186 End stage renal disease: Secondary | ICD-10-CM | POA: Diagnosis not present

## 2015-07-20 DIAGNOSIS — D631 Anemia in chronic kidney disease: Secondary | ICD-10-CM | POA: Diagnosis not present

## 2015-07-23 DIAGNOSIS — I129 Hypertensive chronic kidney disease with stage 1 through stage 4 chronic kidney disease, or unspecified chronic kidney disease: Secondary | ICD-10-CM | POA: Diagnosis not present

## 2015-07-23 DIAGNOSIS — D509 Iron deficiency anemia, unspecified: Secondary | ICD-10-CM | POA: Diagnosis not present

## 2015-07-23 DIAGNOSIS — N186 End stage renal disease: Secondary | ICD-10-CM | POA: Diagnosis not present

## 2015-07-23 DIAGNOSIS — N2581 Secondary hyperparathyroidism of renal origin: Secondary | ICD-10-CM | POA: Diagnosis not present

## 2015-07-23 DIAGNOSIS — D631 Anemia in chronic kidney disease: Secondary | ICD-10-CM | POA: Diagnosis not present

## 2015-07-24 ENCOUNTER — Telehealth: Payer: Self-pay | Admitting: Internal Medicine

## 2015-07-25 DIAGNOSIS — D631 Anemia in chronic kidney disease: Secondary | ICD-10-CM | POA: Diagnosis not present

## 2015-07-25 DIAGNOSIS — I129 Hypertensive chronic kidney disease with stage 1 through stage 4 chronic kidney disease, or unspecified chronic kidney disease: Secondary | ICD-10-CM | POA: Diagnosis not present

## 2015-07-25 DIAGNOSIS — N186 End stage renal disease: Secondary | ICD-10-CM | POA: Diagnosis not present

## 2015-07-25 DIAGNOSIS — N2581 Secondary hyperparathyroidism of renal origin: Secondary | ICD-10-CM | POA: Diagnosis not present

## 2015-07-25 DIAGNOSIS — D509 Iron deficiency anemia, unspecified: Secondary | ICD-10-CM | POA: Diagnosis not present

## 2015-07-27 ENCOUNTER — Encounter: Payer: Self-pay | Admitting: Cardiovascular Disease

## 2015-07-27 ENCOUNTER — Telehealth: Payer: Self-pay | Admitting: *Deleted

## 2015-07-27 ENCOUNTER — Ambulatory Visit (INDEPENDENT_AMBULATORY_CARE_PROVIDER_SITE_OTHER): Payer: Medicare Other | Admitting: Cardiovascular Disease

## 2015-07-27 VITALS — BP 160/104 | HR 78 | Ht 72.0 in | Wt 229.1 lb

## 2015-07-27 DIAGNOSIS — Z79899 Other long term (current) drug therapy: Secondary | ICD-10-CM

## 2015-07-27 DIAGNOSIS — I208 Other forms of angina pectoris: Secondary | ICD-10-CM | POA: Diagnosis not present

## 2015-07-27 DIAGNOSIS — I129 Hypertensive chronic kidney disease with stage 1 through stage 4 chronic kidney disease, or unspecified chronic kidney disease: Secondary | ICD-10-CM | POA: Diagnosis not present

## 2015-07-27 DIAGNOSIS — D631 Anemia in chronic kidney disease: Secondary | ICD-10-CM | POA: Diagnosis not present

## 2015-07-27 DIAGNOSIS — I10 Essential (primary) hypertension: Secondary | ICD-10-CM | POA: Diagnosis not present

## 2015-07-27 DIAGNOSIS — N186 End stage renal disease: Secondary | ICD-10-CM | POA: Diagnosis not present

## 2015-07-27 DIAGNOSIS — E785 Hyperlipidemia, unspecified: Secondary | ICD-10-CM

## 2015-07-27 DIAGNOSIS — D509 Iron deficiency anemia, unspecified: Secondary | ICD-10-CM | POA: Diagnosis not present

## 2015-07-27 DIAGNOSIS — N2581 Secondary hyperparathyroidism of renal origin: Secondary | ICD-10-CM | POA: Diagnosis not present

## 2015-07-27 DIAGNOSIS — Z951 Presence of aortocoronary bypass graft: Secondary | ICD-10-CM

## 2015-07-27 NOTE — Patient Instructions (Signed)
Medication Instructions:  Your physician recommends that you continue on your current medications as directed. Please refer to the Current Medication list given to you today.   Labwork: We will get your labwork from the dialysis center.  Testing/Procedures: none  Follow-Up: Your physician wants you to follow-up in: 12 months with Dr Allyson Sabal. You will receive a reminder letter in the mail two months in advance. If you don't receive a letter, please call our office to schedule the follow-up appointment.   Any Other Special Instructions Will Be Listed Below (If Applicable).     If you need a refill on your cardiac medications before your next appointment, please call your pharmacy.

## 2015-07-27 NOTE — Assessment & Plan Note (Signed)
History of hyperlipidemia on statin therapy followed by the dialysis center.

## 2015-07-27 NOTE — Assessment & Plan Note (Signed)
History of hypertension blood pressure measured at 160/104. He is on metoprolol and Hytrin as well as amlodipine which she did not take today because he has dialysis later this afternoon.

## 2015-07-27 NOTE — Assessment & Plan Note (Addendum)
History of coronary artery disease status post bypass grafting 04/01/11 by Dr. Donata Clay  with a LIMA to his LAD, vein to obtuse marginal branch one and 2 and the posterior lateral branch. He had normal LV function. He underwent cardiac catheterization by Dr. Tresa Endo April 2016 and had PCI of an obtuse marginal branch insertion which was too small to stent as well as stenting of the LAD beyond LIMA insertion. He denies chest pain or shortness of breath.

## 2015-07-27 NOTE — Telephone Encounter (Signed)
Dr Allyson Sabal requested to obtain lipid/liver labs from [facility]- phone [phone]. When center was called they said they do not follow his lipids and said it would be best if we had our lab draw the labs. Orders entered into EPIC. Pt called but no answer. Message left for pt to call us back. Pt will need to know where to go for his labs. He will need to be fasting.

## 2015-07-27 NOTE — Progress Notes (Signed)
07/27/2015 [name]   12/17/1960  [id number]  Primary Physician Oliver Barre, MD Primary Cardiologist: Runell Gess MD [name]   HPI:  The patient is a 55 year old, mild to moderately overweight, divorced, African American male, father of one 77 year old son ... I saw him 09/22/14.Marland Kitchen He has a history of CAD status post bypass grafting by Dr. Kathlee Nations Trigt April 01, 2011 with a LIMA to his LAD, a vein to an obtuse marginal branch 1 and 2, and to the posterolateral branch. His EF was apparently normal pre and postoperatively. He also has difficult to control hypertension, chronic renal insufficiency, now stage 5, for which he sees Dr. Elvis Coil. He is scheduled to have an AV fistula placed by Dr. Imogene Burn sometime in the next several weeks in anticipation of dialysis at some point in the future.   He was admitted in April with substernal chest pain and anterolateral T-wave inversion and was cathed and intervened on by Dr. Tresa Endo. He had PCI of his OM vein graft insertion which was too small to stent, and stenting of the LAD beyond his LIMA insertion.  He started hemodialysis in October 2015 and apparently had a stroke at the same time as well. He denies chest pain or shortness of breath. Since I saw him last year he denies chest pain or shortness of breath.   Current Outpatient Prescriptions  Medication Sig Dispense Refill  . acetaminophen (TYLENOL) 500 MG tablet Take 1,000 mg by mouth every 6 (six) hours as needed (pain).    Marland Kitchen amLODipine (NORVASC) 5 MG tablet Take 1 tablet (5 mg total) by mouth daily. 30 tablet 5  . aspirin EC 81 MG EC tablet Take 1 tablet (81 mg total) by mouth daily.    Marland Kitchen atorvastatin (LIPITOR) 20 MG tablet Take 1 tablet (20 mg total) by mouth daily. 30 tablet 1  . cinacalcet (SENSIPAR) 30 MG tablet Take 60 mg by mouth at bedtime. Reported on 07/05/2015    . clopidogrel (PLAVIX) 75 MG tablet Take 1 tablet (75 mg total) by mouth daily. 90 tablet 1  .  doxycycline (VIBRA-TABS) 100 MG tablet Take 1 tablet (100 mg total) by mouth 2 (two) times daily. 14 tablet 0  . febuxostat (ULORIC) 40 MG tablet Take 40 mg by mouth daily.     . isosorbide mononitrate (IMDUR) 60 MG 24 hr tablet Take 1/2 tablet twice daily 90 tablet 3  . lanthanum ([name]) 1000 MG chewable tablet Chew 2,000 mg by mouth 3 (three) times daily with meals.    . lidocaine-prilocaine (EMLA) cream Apply 1 application topically as needed. Apply as needed for dialysis site    . metoprolol (LOPRESSOR) 50 MG tablet Take 1 tablet (50 mg total) by mouth 2 (two) times daily. 180 tablet 3  . NITROSTAT 0.4 MG SL tablet PLACE 1 TABLET UNDER TONGUE AS NEEDED FOR CHEST PAIN EVERY 5 MINUTES (MAX 3DOSES) 25 tablet 4  . terazosin (HYTRIN) 10 MG capsule TAKE 1 CAPSULE BY MOUTH DAILY AT BEDTIME 90 capsule 2  . ticagrelor (BRILINTA) 90 MG TABS tablet Take 90 mg by mouth 2 (two) times daily.    Marland Kitchen zolpidem (AMBIEN) 10 MG tablet Take 1 tablet (10 mg total) by mouth at bedtime as needed for sleep. 30 tablet 5   No current facility-administered medications for this visit.    Allergies  Allergen Reactions  . Shrimp [Shellfish Allergy] Shortness Of Breath  . Insulins Other (See Comments)  Patient unsure as to the kind of insulin but states that it has previously caused seizures. This occurred in the hospital in 2006; but in most recent hospitalization (Jan 2013) received insulin but did not have reaction  . Atorvastatin Other (See Comments)    weakness  . Simvastatin Other (See Comments)     abnormal liver tests  . Ultram [Tramadol] Other (See Comments)    Liver enzyme     Social History   Social History  . Marital Status: Divorced    Spouse Name: N/A  . Number of Children: N/A  . Years of Education: N/A   Occupational History  . Not on file.   Social History Main Topics  . Smoking status: Never Smoker   . Smokeless tobacco: Former Neurosurgeon    Types: Chew    Quit date: 03/18/2003      Comment: chewed tobacco   . Alcohol Use: 1.2 oz/week    2 Shots of liquor, 0 Standard drinks or equivalent per week     Comment: rarely; beer  . Drug Use: No  . Sexual Activity:    Partners: Male   Other Topics Concern  . Not on file   Social History Narrative     Review of Systems: General: negative for chills, fever, night sweats or weight changes.  Cardiovascular: negative for chest pain, dyspnea on exertion, edema, orthopnea, palpitations, paroxysmal nocturnal dyspnea or shortness of breath Dermatological: negative for rash Respiratory: negative for cough or wheezing Urologic: negative for hematuria Abdominal: negative for nausea, vomiting, diarrhea, bright red blood per rectum, melena, or hematemesis Neurologic: negative for visual changes, syncope, or dizziness All other systems reviewed and are otherwise negative except as noted above.    Blood pressure 160/104, pulse 78, height 6' (1.829 m), weight 229 lb 2 oz (103.93 kg).  General appearance: alert and no distress Neck: no adenopathy, no JVD, supple, symmetrical, trachea midline, thyroid not enlarged, symmetric, no tenderness/mass/nodules and left carotid bruit probably related to his AV fistula Lungs: clear to auscultation bilaterally Heart: regular rate and rhythm, S1, S2 normal, no murmur, click, rub or gallop Extremities: extremities normal, atraumatic, no cyanosis or edema  EKG sinus rhythm at 77 with septal Q waves. I personally reviewed this EKG  ASSESSMENT AND PLAN:   Hypertension, accelerated History of hypertension blood pressure measured at 160/104. He is on metoprolol and Hytrin as well as amlodipine which she did not take today because he has dialysis later this afternoon.  Hyperlipidemia History of hyperlipidemia on statin therapy followed by the dialysis center.  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 Donata Clay  with a LIMA to his LAD, vein to obtuse marginal branch one and 2 and the posterior lateral branch. He had normal LV function. He underwent cardiac catheterization by Dr. Tresa Endo April 2016 and had PCI of an obtuse marginal branch insertion which was too small to stent as well as stenting of the LAD beyond LIMA insertion. He denies chest pain or shortness of breath.      Runell Gess MD FACP,FACC,FAHA, [facility] 07/27/2015 11:37 AM

## 2015-07-30 DIAGNOSIS — D509 Iron deficiency anemia, unspecified: Secondary | ICD-10-CM | POA: Diagnosis not present

## 2015-07-30 DIAGNOSIS — N186 End stage renal disease: Secondary | ICD-10-CM | POA: Diagnosis not present

## 2015-07-30 DIAGNOSIS — D631 Anemia in chronic kidney disease: Secondary | ICD-10-CM | POA: Diagnosis not present

## 2015-07-30 DIAGNOSIS — I129 Hypertensive chronic kidney disease with stage 1 through stage 4 chronic kidney disease, or unspecified chronic kidney disease: Secondary | ICD-10-CM | POA: Diagnosis not present

## 2015-07-30 DIAGNOSIS — N2581 Secondary hyperparathyroidism of renal origin: Secondary | ICD-10-CM | POA: Diagnosis not present

## 2015-08-01 ENCOUNTER — Telehealth: Payer: Self-pay | Admitting: Internal Medicine

## 2015-08-01 DIAGNOSIS — D631 Anemia in chronic kidney disease: Secondary | ICD-10-CM | POA: Diagnosis not present

## 2015-08-01 DIAGNOSIS — N186 End stage renal disease: Secondary | ICD-10-CM | POA: Diagnosis not present

## 2015-08-01 DIAGNOSIS — D509 Iron deficiency anemia, unspecified: Secondary | ICD-10-CM | POA: Diagnosis not present

## 2015-08-01 DIAGNOSIS — N2581 Secondary hyperparathyroidism of renal origin: Secondary | ICD-10-CM | POA: Diagnosis not present

## 2015-08-01 DIAGNOSIS — I129 Hypertensive chronic kidney disease with stage 1 through stage 4 chronic kidney disease, or unspecified chronic kidney disease: Secondary | ICD-10-CM | POA: Diagnosis not present

## 2015-08-01 NOTE — Telephone Encounter (Signed)
Pt request trest result that was done from Dr. Macario Golds on 07/17/15. Please call him back

## 2015-08-02 NOTE — Telephone Encounter (Signed)
Pt is calling back in regards.

## 2015-08-02 NOTE — Telephone Encounter (Signed)
Patient aware.

## 2015-08-02 NOTE — Telephone Encounter (Signed)
08-02-15 called pt and made aware to get fasting lab work

## 2015-08-03 DIAGNOSIS — D631 Anemia in chronic kidney disease: Secondary | ICD-10-CM | POA: Diagnosis not present

## 2015-08-03 DIAGNOSIS — I129 Hypertensive chronic kidney disease with stage 1 through stage 4 chronic kidney disease, or unspecified chronic kidney disease: Secondary | ICD-10-CM | POA: Diagnosis not present

## 2015-08-03 DIAGNOSIS — D509 Iron deficiency anemia, unspecified: Secondary | ICD-10-CM | POA: Diagnosis not present

## 2015-08-03 DIAGNOSIS — N2581 Secondary hyperparathyroidism of renal origin: Secondary | ICD-10-CM | POA: Diagnosis not present

## 2015-08-03 DIAGNOSIS — N186 End stage renal disease: Secondary | ICD-10-CM | POA: Diagnosis not present

## 2015-08-06 DIAGNOSIS — N186 End stage renal disease: Secondary | ICD-10-CM | POA: Diagnosis not present

## 2015-08-06 DIAGNOSIS — D631 Anemia in chronic kidney disease: Secondary | ICD-10-CM | POA: Diagnosis not present

## 2015-08-06 DIAGNOSIS — D509 Iron deficiency anemia, unspecified: Secondary | ICD-10-CM | POA: Diagnosis not present

## 2015-08-06 DIAGNOSIS — I129 Hypertensive chronic kidney disease with stage 1 through stage 4 chronic kidney disease, or unspecified chronic kidney disease: Secondary | ICD-10-CM | POA: Diagnosis not present

## 2015-08-06 DIAGNOSIS — N2581 Secondary hyperparathyroidism of renal origin: Secondary | ICD-10-CM | POA: Diagnosis not present

## 2015-08-09 NOTE — Addendum Note (Signed)
Addended by: Evans Lance on: 08/09/2015 05:04 PM   Modules accepted: Orders

## 2015-08-10 DIAGNOSIS — I129 Hypertensive chronic kidney disease with stage 1 through stage 4 chronic kidney disease, or unspecified chronic kidney disease: Secondary | ICD-10-CM | POA: Diagnosis not present

## 2015-08-10 DIAGNOSIS — N186 End stage renal disease: Secondary | ICD-10-CM | POA: Diagnosis not present

## 2015-08-10 DIAGNOSIS — D631 Anemia in chronic kidney disease: Secondary | ICD-10-CM | POA: Diagnosis not present

## 2015-08-10 DIAGNOSIS — D509 Iron deficiency anemia, unspecified: Secondary | ICD-10-CM | POA: Diagnosis not present

## 2015-08-10 DIAGNOSIS — N2581 Secondary hyperparathyroidism of renal origin: Secondary | ICD-10-CM | POA: Diagnosis not present

## 2015-08-13 DIAGNOSIS — I129 Hypertensive chronic kidney disease with stage 1 through stage 4 chronic kidney disease, or unspecified chronic kidney disease: Secondary | ICD-10-CM | POA: Diagnosis not present

## 2015-08-13 DIAGNOSIS — D631 Anemia in chronic kidney disease: Secondary | ICD-10-CM | POA: Diagnosis not present

## 2015-08-13 DIAGNOSIS — N186 End stage renal disease: Secondary | ICD-10-CM | POA: Diagnosis not present

## 2015-08-13 DIAGNOSIS — D509 Iron deficiency anemia, unspecified: Secondary | ICD-10-CM | POA: Diagnosis not present

## 2015-08-13 DIAGNOSIS — N2581 Secondary hyperparathyroidism of renal origin: Secondary | ICD-10-CM | POA: Diagnosis not present

## 2015-08-14 ENCOUNTER — Telehealth: Payer: Self-pay

## 2015-08-14 ENCOUNTER — Encounter: Payer: Self-pay | Admitting: Internal Medicine

## 2015-08-14 NOTE — Telephone Encounter (Signed)
Patient came in with sister and stated that they went to the social security office today to change his sister over to his finances due to his disabilities. His sister stated that they needed a letter to give to the social security office stating that he needs someone to take over his finances. Please advise.

## 2015-08-14 NOTE — Telephone Encounter (Signed)
Please advise sisters name also needs to be in the letter. Sisters name is [name].

## 2015-08-14 NOTE — Telephone Encounter (Signed)
Done hardcopy to Corinne  

## 2015-08-15 DIAGNOSIS — N186 End stage renal disease: Secondary | ICD-10-CM | POA: Diagnosis not present

## 2015-08-15 DIAGNOSIS — N2581 Secondary hyperparathyroidism of renal origin: Secondary | ICD-10-CM | POA: Diagnosis not present

## 2015-08-15 DIAGNOSIS — D631 Anemia in chronic kidney disease: Secondary | ICD-10-CM | POA: Diagnosis not present

## 2015-08-15 DIAGNOSIS — I129 Hypertensive chronic kidney disease with stage 1 through stage 4 chronic kidney disease, or unspecified chronic kidney disease: Secondary | ICD-10-CM | POA: Diagnosis not present

## 2015-08-15 DIAGNOSIS — I120 Hypertensive chronic kidney disease with stage 5 chronic kidney disease or end stage renal disease: Secondary | ICD-10-CM | POA: Diagnosis not present

## 2015-08-15 DIAGNOSIS — D509 Iron deficiency anemia, unspecified: Secondary | ICD-10-CM | POA: Diagnosis not present

## 2015-08-15 DIAGNOSIS — Z992 Dependence on renal dialysis: Secondary | ICD-10-CM | POA: Diagnosis not present

## 2015-08-15 NOTE — Telephone Encounter (Signed)
Letter placed up front for pick up. Sister notified.

## 2015-08-17 DIAGNOSIS — D631 Anemia in chronic kidney disease: Secondary | ICD-10-CM | POA: Diagnosis not present

## 2015-08-17 DIAGNOSIS — D509 Iron deficiency anemia, unspecified: Secondary | ICD-10-CM | POA: Diagnosis not present

## 2015-08-17 DIAGNOSIS — N186 End stage renal disease: Secondary | ICD-10-CM | POA: Diagnosis not present

## 2015-08-17 DIAGNOSIS — I129 Hypertensive chronic kidney disease with stage 1 through stage 4 chronic kidney disease, or unspecified chronic kidney disease: Secondary | ICD-10-CM | POA: Diagnosis not present

## 2015-08-20 DIAGNOSIS — D631 Anemia in chronic kidney disease: Secondary | ICD-10-CM | POA: Diagnosis not present

## 2015-08-20 DIAGNOSIS — N186 End stage renal disease: Secondary | ICD-10-CM | POA: Diagnosis not present

## 2015-08-20 DIAGNOSIS — D509 Iron deficiency anemia, unspecified: Secondary | ICD-10-CM | POA: Diagnosis not present

## 2015-08-20 DIAGNOSIS — I129 Hypertensive chronic kidney disease with stage 1 through stage 4 chronic kidney disease, or unspecified chronic kidney disease: Secondary | ICD-10-CM | POA: Diagnosis not present

## 2015-08-22 DIAGNOSIS — I129 Hypertensive chronic kidney disease with stage 1 through stage 4 chronic kidney disease, or unspecified chronic kidney disease: Secondary | ICD-10-CM | POA: Diagnosis not present

## 2015-08-22 DIAGNOSIS — D509 Iron deficiency anemia, unspecified: Secondary | ICD-10-CM | POA: Diagnosis not present

## 2015-08-22 DIAGNOSIS — D631 Anemia in chronic kidney disease: Secondary | ICD-10-CM | POA: Diagnosis not present

## 2015-08-22 DIAGNOSIS — N186 End stage renal disease: Secondary | ICD-10-CM | POA: Diagnosis not present

## 2015-08-24 DIAGNOSIS — N186 End stage renal disease: Secondary | ICD-10-CM | POA: Diagnosis not present

## 2015-08-24 DIAGNOSIS — D509 Iron deficiency anemia, unspecified: Secondary | ICD-10-CM | POA: Diagnosis not present

## 2015-08-24 DIAGNOSIS — I129 Hypertensive chronic kidney disease with stage 1 through stage 4 chronic kidney disease, or unspecified chronic kidney disease: Secondary | ICD-10-CM | POA: Diagnosis not present

## 2015-08-24 DIAGNOSIS — D631 Anemia in chronic kidney disease: Secondary | ICD-10-CM | POA: Diagnosis not present

## 2015-08-27 DIAGNOSIS — D509 Iron deficiency anemia, unspecified: Secondary | ICD-10-CM | POA: Diagnosis not present

## 2015-08-27 DIAGNOSIS — I129 Hypertensive chronic kidney disease with stage 1 through stage 4 chronic kidney disease, or unspecified chronic kidney disease: Secondary | ICD-10-CM | POA: Diagnosis not present

## 2015-08-27 DIAGNOSIS — D631 Anemia in chronic kidney disease: Secondary | ICD-10-CM | POA: Diagnosis not present

## 2015-08-27 DIAGNOSIS — N186 End stage renal disease: Secondary | ICD-10-CM | POA: Diagnosis not present

## 2015-08-29 DIAGNOSIS — N186 End stage renal disease: Secondary | ICD-10-CM | POA: Diagnosis not present

## 2015-08-29 DIAGNOSIS — D631 Anemia in chronic kidney disease: Secondary | ICD-10-CM | POA: Diagnosis not present

## 2015-08-29 DIAGNOSIS — I129 Hypertensive chronic kidney disease with stage 1 through stage 4 chronic kidney disease, or unspecified chronic kidney disease: Secondary | ICD-10-CM | POA: Diagnosis not present

## 2015-08-29 DIAGNOSIS — D509 Iron deficiency anemia, unspecified: Secondary | ICD-10-CM | POA: Diagnosis not present

## 2015-08-31 DIAGNOSIS — D631 Anemia in chronic kidney disease: Secondary | ICD-10-CM | POA: Diagnosis not present

## 2015-08-31 DIAGNOSIS — D509 Iron deficiency anemia, unspecified: Secondary | ICD-10-CM | POA: Diagnosis not present

## 2015-08-31 DIAGNOSIS — I129 Hypertensive chronic kidney disease with stage 1 through stage 4 chronic kidney disease, or unspecified chronic kidney disease: Secondary | ICD-10-CM | POA: Diagnosis not present

## 2015-08-31 DIAGNOSIS — N186 End stage renal disease: Secondary | ICD-10-CM | POA: Diagnosis not present

## 2015-09-03 DIAGNOSIS — D509 Iron deficiency anemia, unspecified: Secondary | ICD-10-CM | POA: Diagnosis not present

## 2015-09-03 DIAGNOSIS — D631 Anemia in chronic kidney disease: Secondary | ICD-10-CM | POA: Diagnosis not present

## 2015-09-03 DIAGNOSIS — I129 Hypertensive chronic kidney disease with stage 1 through stage 4 chronic kidney disease, or unspecified chronic kidney disease: Secondary | ICD-10-CM | POA: Diagnosis not present

## 2015-09-03 DIAGNOSIS — N186 End stage renal disease: Secondary | ICD-10-CM | POA: Diagnosis not present

## 2015-09-05 DIAGNOSIS — I129 Hypertensive chronic kidney disease with stage 1 through stage 4 chronic kidney disease, or unspecified chronic kidney disease: Secondary | ICD-10-CM | POA: Diagnosis not present

## 2015-09-05 DIAGNOSIS — D509 Iron deficiency anemia, unspecified: Secondary | ICD-10-CM | POA: Diagnosis not present

## 2015-09-05 DIAGNOSIS — D631 Anemia in chronic kidney disease: Secondary | ICD-10-CM | POA: Diagnosis not present

## 2015-09-05 DIAGNOSIS — N186 End stage renal disease: Secondary | ICD-10-CM | POA: Diagnosis not present

## 2015-09-07 DIAGNOSIS — N186 End stage renal disease: Secondary | ICD-10-CM | POA: Diagnosis not present

## 2015-09-07 DIAGNOSIS — D509 Iron deficiency anemia, unspecified: Secondary | ICD-10-CM | POA: Diagnosis not present

## 2015-09-07 DIAGNOSIS — D631 Anemia in chronic kidney disease: Secondary | ICD-10-CM | POA: Diagnosis not present

## 2015-09-07 DIAGNOSIS — I129 Hypertensive chronic kidney disease with stage 1 through stage 4 chronic kidney disease, or unspecified chronic kidney disease: Secondary | ICD-10-CM | POA: Diagnosis not present

## 2015-09-10 ENCOUNTER — Other Ambulatory Visit: Payer: Self-pay | Admitting: *Deleted

## 2015-09-10 DIAGNOSIS — D631 Anemia in chronic kidney disease: Secondary | ICD-10-CM | POA: Diagnosis not present

## 2015-09-10 DIAGNOSIS — D509 Iron deficiency anemia, unspecified: Secondary | ICD-10-CM | POA: Diagnosis not present

## 2015-09-10 DIAGNOSIS — I129 Hypertensive chronic kidney disease with stage 1 through stage 4 chronic kidney disease, or unspecified chronic kidney disease: Secondary | ICD-10-CM | POA: Diagnosis not present

## 2015-09-10 DIAGNOSIS — N186 End stage renal disease: Secondary | ICD-10-CM | POA: Diagnosis not present

## 2015-09-10 MED ORDER — AMLODIPINE BESYLATE 5 MG PO TABS: 5.0000 mg | ORAL_TABLET | Freq: Every day | ORAL | Status: DC

## 2015-09-10 MED ORDER — LANTHANUM CARBONATE 1000 MG PO CHEW: 2000.0000 mg | CHEWABLE_TABLET | Freq: Three times a day (TID) | ORAL | Status: AC

## 2015-09-12 DIAGNOSIS — D509 Iron deficiency anemia, unspecified: Secondary | ICD-10-CM | POA: Diagnosis not present

## 2015-09-12 DIAGNOSIS — I129 Hypertensive chronic kidney disease with stage 1 through stage 4 chronic kidney disease, or unspecified chronic kidney disease: Secondary | ICD-10-CM | POA: Diagnosis not present

## 2015-09-12 DIAGNOSIS — D631 Anemia in chronic kidney disease: Secondary | ICD-10-CM | POA: Diagnosis not present

## 2015-09-12 DIAGNOSIS — N186 End stage renal disease: Secondary | ICD-10-CM | POA: Diagnosis not present

## 2015-09-14 DIAGNOSIS — I129 Hypertensive chronic kidney disease with stage 1 through stage 4 chronic kidney disease, or unspecified chronic kidney disease: Secondary | ICD-10-CM | POA: Diagnosis not present

## 2015-09-14 DIAGNOSIS — Z992 Dependence on renal dialysis: Secondary | ICD-10-CM | POA: Diagnosis not present

## 2015-09-14 DIAGNOSIS — I120 Hypertensive chronic kidney disease with stage 5 chronic kidney disease or end stage renal disease: Secondary | ICD-10-CM | POA: Diagnosis not present

## 2015-09-14 DIAGNOSIS — N186 End stage renal disease: Secondary | ICD-10-CM | POA: Diagnosis not present

## 2015-09-14 DIAGNOSIS — D509 Iron deficiency anemia, unspecified: Secondary | ICD-10-CM | POA: Diagnosis not present

## 2015-09-14 DIAGNOSIS — D631 Anemia in chronic kidney disease: Secondary | ICD-10-CM | POA: Diagnosis not present

## 2015-09-17 ENCOUNTER — Other Ambulatory Visit: Payer: Self-pay | Admitting: Pharmacist

## 2015-09-17 DIAGNOSIS — I129 Hypertensive chronic kidney disease with stage 1 through stage 4 chronic kidney disease, or unspecified chronic kidney disease: Secondary | ICD-10-CM | POA: Diagnosis not present

## 2015-09-17 DIAGNOSIS — N186 End stage renal disease: Secondary | ICD-10-CM | POA: Diagnosis not present

## 2015-09-17 DIAGNOSIS — N2581 Secondary hyperparathyroidism of renal origin: Secondary | ICD-10-CM | POA: Diagnosis not present

## 2015-09-17 DIAGNOSIS — D509 Iron deficiency anemia, unspecified: Secondary | ICD-10-CM | POA: Diagnosis not present

## 2015-09-17 MED ORDER — AMLODIPINE BESYLATE 5 MG PO TABS: 5.0000 mg | ORAL_TABLET | Freq: Every day | ORAL | Status: DC

## 2015-09-19 DIAGNOSIS — D509 Iron deficiency anemia, unspecified: Secondary | ICD-10-CM | POA: Diagnosis not present

## 2015-09-19 DIAGNOSIS — I129 Hypertensive chronic kidney disease with stage 1 through stage 4 chronic kidney disease, or unspecified chronic kidney disease: Secondary | ICD-10-CM | POA: Diagnosis not present

## 2015-09-19 DIAGNOSIS — N186 End stage renal disease: Secondary | ICD-10-CM | POA: Diagnosis not present

## 2015-09-19 DIAGNOSIS — N2581 Secondary hyperparathyroidism of renal origin: Secondary | ICD-10-CM | POA: Diagnosis not present

## 2015-09-21 DIAGNOSIS — D509 Iron deficiency anemia, unspecified: Secondary | ICD-10-CM | POA: Diagnosis not present

## 2015-09-21 DIAGNOSIS — N186 End stage renal disease: Secondary | ICD-10-CM | POA: Diagnosis not present

## 2015-09-21 DIAGNOSIS — N2581 Secondary hyperparathyroidism of renal origin: Secondary | ICD-10-CM | POA: Diagnosis not present

## 2015-09-21 DIAGNOSIS — I129 Hypertensive chronic kidney disease with stage 1 through stage 4 chronic kidney disease, or unspecified chronic kidney disease: Secondary | ICD-10-CM | POA: Diagnosis not present

## 2015-09-24 DIAGNOSIS — N186 End stage renal disease: Secondary | ICD-10-CM | POA: Diagnosis not present

## 2015-09-24 DIAGNOSIS — I129 Hypertensive chronic kidney disease with stage 1 through stage 4 chronic kidney disease, or unspecified chronic kidney disease: Secondary | ICD-10-CM | POA: Diagnosis not present

## 2015-09-24 DIAGNOSIS — D509 Iron deficiency anemia, unspecified: Secondary | ICD-10-CM | POA: Diagnosis not present

## 2015-09-24 DIAGNOSIS — N2581 Secondary hyperparathyroidism of renal origin: Secondary | ICD-10-CM | POA: Diagnosis not present

## 2015-09-26 DIAGNOSIS — N186 End stage renal disease: Secondary | ICD-10-CM | POA: Diagnosis not present

## 2015-09-26 DIAGNOSIS — D509 Iron deficiency anemia, unspecified: Secondary | ICD-10-CM | POA: Diagnosis not present

## 2015-09-26 DIAGNOSIS — I129 Hypertensive chronic kidney disease with stage 1 through stage 4 chronic kidney disease, or unspecified chronic kidney disease: Secondary | ICD-10-CM | POA: Diagnosis not present

## 2015-09-26 DIAGNOSIS — N2581 Secondary hyperparathyroidism of renal origin: Secondary | ICD-10-CM | POA: Diagnosis not present

## 2015-09-27 ENCOUNTER — Other Ambulatory Visit: Payer: Self-pay | Admitting: *Deleted

## 2015-09-27 MED ORDER — AMLODIPINE BESYLATE 5 MG PO TABS: 5.0000 mg | ORAL_TABLET | Freq: Every day | ORAL | Status: DC

## 2015-09-28 DIAGNOSIS — N2581 Secondary hyperparathyroidism of renal origin: Secondary | ICD-10-CM | POA: Diagnosis not present

## 2015-09-28 DIAGNOSIS — I129 Hypertensive chronic kidney disease with stage 1 through stage 4 chronic kidney disease, or unspecified chronic kidney disease: Secondary | ICD-10-CM | POA: Diagnosis not present

## 2015-09-28 DIAGNOSIS — D509 Iron deficiency anemia, unspecified: Secondary | ICD-10-CM | POA: Diagnosis not present

## 2015-09-28 DIAGNOSIS — N186 End stage renal disease: Secondary | ICD-10-CM | POA: Diagnosis not present

## 2015-10-01 DIAGNOSIS — I129 Hypertensive chronic kidney disease with stage 1 through stage 4 chronic kidney disease, or unspecified chronic kidney disease: Secondary | ICD-10-CM | POA: Diagnosis not present

## 2015-10-01 DIAGNOSIS — N186 End stage renal disease: Secondary | ICD-10-CM | POA: Diagnosis not present

## 2015-10-01 DIAGNOSIS — D509 Iron deficiency anemia, unspecified: Secondary | ICD-10-CM | POA: Diagnosis not present

## 2015-10-01 DIAGNOSIS — N2581 Secondary hyperparathyroidism of renal origin: Secondary | ICD-10-CM | POA: Diagnosis not present

## 2015-10-02 ENCOUNTER — Other Ambulatory Visit: Payer: Self-pay | Admitting: Cardiovascular Disease

## 2015-10-03 DIAGNOSIS — I129 Hypertensive chronic kidney disease with stage 1 through stage 4 chronic kidney disease, or unspecified chronic kidney disease: Secondary | ICD-10-CM | POA: Diagnosis not present

## 2015-10-03 DIAGNOSIS — D509 Iron deficiency anemia, unspecified: Secondary | ICD-10-CM | POA: Diagnosis not present

## 2015-10-03 DIAGNOSIS — N186 End stage renal disease: Secondary | ICD-10-CM | POA: Diagnosis not present

## 2015-10-03 DIAGNOSIS — N2581 Secondary hyperparathyroidism of renal origin: Secondary | ICD-10-CM | POA: Diagnosis not present

## 2015-10-05 DIAGNOSIS — N186 End stage renal disease: Secondary | ICD-10-CM | POA: Diagnosis not present

## 2015-10-05 DIAGNOSIS — I129 Hypertensive chronic kidney disease with stage 1 through stage 4 chronic kidney disease, or unspecified chronic kidney disease: Secondary | ICD-10-CM | POA: Diagnosis not present

## 2015-10-05 DIAGNOSIS — N2581 Secondary hyperparathyroidism of renal origin: Secondary | ICD-10-CM | POA: Diagnosis not present

## 2015-10-05 DIAGNOSIS — D509 Iron deficiency anemia, unspecified: Secondary | ICD-10-CM | POA: Diagnosis not present

## 2015-10-08 DIAGNOSIS — D509 Iron deficiency anemia, unspecified: Secondary | ICD-10-CM | POA: Diagnosis not present

## 2015-10-08 DIAGNOSIS — N186 End stage renal disease: Secondary | ICD-10-CM | POA: Diagnosis not present

## 2015-10-08 DIAGNOSIS — I129 Hypertensive chronic kidney disease with stage 1 through stage 4 chronic kidney disease, or unspecified chronic kidney disease: Secondary | ICD-10-CM | POA: Diagnosis not present

## 2015-10-08 DIAGNOSIS — N2581 Secondary hyperparathyroidism of renal origin: Secondary | ICD-10-CM | POA: Diagnosis not present

## 2015-10-10 DIAGNOSIS — D509 Iron deficiency anemia, unspecified: Secondary | ICD-10-CM | POA: Diagnosis not present

## 2015-10-10 DIAGNOSIS — I129 Hypertensive chronic kidney disease with stage 1 through stage 4 chronic kidney disease, or unspecified chronic kidney disease: Secondary | ICD-10-CM | POA: Diagnosis not present

## 2015-10-10 DIAGNOSIS — N186 End stage renal disease: Secondary | ICD-10-CM | POA: Diagnosis not present

## 2015-10-10 DIAGNOSIS — N2581 Secondary hyperparathyroidism of renal origin: Secondary | ICD-10-CM | POA: Diagnosis not present

## 2015-10-12 DIAGNOSIS — N2581 Secondary hyperparathyroidism of renal origin: Secondary | ICD-10-CM | POA: Diagnosis not present

## 2015-10-12 DIAGNOSIS — N186 End stage renal disease: Secondary | ICD-10-CM | POA: Diagnosis not present

## 2015-10-12 DIAGNOSIS — D509 Iron deficiency anemia, unspecified: Secondary | ICD-10-CM | POA: Diagnosis not present

## 2015-10-12 DIAGNOSIS — I129 Hypertensive chronic kidney disease with stage 1 through stage 4 chronic kidney disease, or unspecified chronic kidney disease: Secondary | ICD-10-CM | POA: Diagnosis not present

## 2015-10-15 ENCOUNTER — Telehealth: Payer: Self-pay | Admitting: *Deleted

## 2015-10-15 DIAGNOSIS — N186 End stage renal disease: Secondary | ICD-10-CM | POA: Diagnosis not present

## 2015-10-15 DIAGNOSIS — Z992 Dependence on renal dialysis: Secondary | ICD-10-CM | POA: Diagnosis not present

## 2015-10-15 DIAGNOSIS — D509 Iron deficiency anemia, unspecified: Secondary | ICD-10-CM | POA: Diagnosis not present

## 2015-10-15 DIAGNOSIS — I120 Hypertensive chronic kidney disease with stage 5 chronic kidney disease or end stage renal disease: Secondary | ICD-10-CM | POA: Diagnosis not present

## 2015-10-15 DIAGNOSIS — I129 Hypertensive chronic kidney disease with stage 1 through stage 4 chronic kidney disease, or unspecified chronic kidney disease: Secondary | ICD-10-CM | POA: Diagnosis not present

## 2015-10-15 DIAGNOSIS — N2581 Secondary hyperparathyroidism of renal origin: Secondary | ICD-10-CM | POA: Diagnosis not present

## 2015-10-15 MED ORDER — ZOLPIDEM TARTRATE 10 MG PO TABS: 10.0000 mg | ORAL_TABLET | Freq: Every evening | ORAL | 0 refills | Status: DC | PRN

## 2015-10-15 NOTE — Telephone Encounter (Signed)
Rec'd fax pt requesting refill on his Zolpidem. Last filled 08/28/15 pls advise in MD absence...Raechel Chute

## 2015-10-15 NOTE — Telephone Encounter (Signed)
Rx faxed back to [facility]../lmb

## 2015-10-15 NOTE — Telephone Encounter (Signed)
30 day supply of medication supplied in PCP absence.

## 2015-10-16 ENCOUNTER — Encounter: Payer: Self-pay | Admitting: Internal Medicine

## 2015-10-17 ENCOUNTER — Other Ambulatory Visit: Payer: Self-pay | Admitting: Family

## 2015-10-17 DIAGNOSIS — N186 End stage renal disease: Secondary | ICD-10-CM | POA: Diagnosis not present

## 2015-10-17 DIAGNOSIS — D509 Iron deficiency anemia, unspecified: Secondary | ICD-10-CM | POA: Diagnosis not present

## 2015-10-17 DIAGNOSIS — I129 Hypertensive chronic kidney disease with stage 1 through stage 4 chronic kidney disease, or unspecified chronic kidney disease: Secondary | ICD-10-CM | POA: Diagnosis not present

## 2015-10-17 DIAGNOSIS — Z23 Encounter for immunization: Secondary | ICD-10-CM | POA: Diagnosis not present

## 2015-10-17 DIAGNOSIS — D631 Anemia in chronic kidney disease: Secondary | ICD-10-CM | POA: Diagnosis not present

## 2015-10-17 DIAGNOSIS — N2581 Secondary hyperparathyroidism of renal origin: Secondary | ICD-10-CM | POA: Diagnosis not present

## 2015-10-18 MED ORDER — ZOLPIDEM TARTRATE 10 MG PO TABS: 10.0000 mg | ORAL_TABLET | Freq: Every evening | ORAL | 0 refills | Status: DC | PRN

## 2015-10-18 NOTE — Telephone Encounter (Signed)
Faxed script back to [facility].../lmb 

## 2015-10-18 NOTE — Telephone Encounter (Signed)
Done hardcopy to Corinne  Pt due for ROV please 

## 2015-10-18 NOTE — Telephone Encounter (Signed)
Tried to contact patient.  No VM. °

## 2015-10-19 DIAGNOSIS — D631 Anemia in chronic kidney disease: Secondary | ICD-10-CM | POA: Diagnosis not present

## 2015-10-19 DIAGNOSIS — D509 Iron deficiency anemia, unspecified: Secondary | ICD-10-CM | POA: Diagnosis not present

## 2015-10-19 DIAGNOSIS — N2581 Secondary hyperparathyroidism of renal origin: Secondary | ICD-10-CM | POA: Diagnosis not present

## 2015-10-19 DIAGNOSIS — Z23 Encounter for immunization: Secondary | ICD-10-CM | POA: Diagnosis not present

## 2015-10-19 DIAGNOSIS — N186 End stage renal disease: Secondary | ICD-10-CM | POA: Diagnosis not present

## 2015-10-19 DIAGNOSIS — I129 Hypertensive chronic kidney disease with stage 1 through stage 4 chronic kidney disease, or unspecified chronic kidney disease: Secondary | ICD-10-CM | POA: Diagnosis not present

## 2015-10-22 DIAGNOSIS — N186 End stage renal disease: Secondary | ICD-10-CM | POA: Diagnosis not present

## 2015-10-22 DIAGNOSIS — I129 Hypertensive chronic kidney disease with stage 1 through stage 4 chronic kidney disease, or unspecified chronic kidney disease: Secondary | ICD-10-CM | POA: Diagnosis not present

## 2015-10-22 DIAGNOSIS — D509 Iron deficiency anemia, unspecified: Secondary | ICD-10-CM | POA: Diagnosis not present

## 2015-10-22 DIAGNOSIS — Z23 Encounter for immunization: Secondary | ICD-10-CM | POA: Diagnosis not present

## 2015-10-22 DIAGNOSIS — D631 Anemia in chronic kidney disease: Secondary | ICD-10-CM | POA: Diagnosis not present

## 2015-10-22 DIAGNOSIS — N2581 Secondary hyperparathyroidism of renal origin: Secondary | ICD-10-CM | POA: Diagnosis not present

## 2015-10-24 DIAGNOSIS — I129 Hypertensive chronic kidney disease with stage 1 through stage 4 chronic kidney disease, or unspecified chronic kidney disease: Secondary | ICD-10-CM | POA: Diagnosis not present

## 2015-10-24 DIAGNOSIS — N186 End stage renal disease: Secondary | ICD-10-CM | POA: Diagnosis not present

## 2015-10-24 DIAGNOSIS — Z23 Encounter for immunization: Secondary | ICD-10-CM | POA: Diagnosis not present

## 2015-10-24 DIAGNOSIS — N2581 Secondary hyperparathyroidism of renal origin: Secondary | ICD-10-CM | POA: Diagnosis not present

## 2015-10-24 DIAGNOSIS — D631 Anemia in chronic kidney disease: Secondary | ICD-10-CM | POA: Diagnosis not present

## 2015-10-24 DIAGNOSIS — D509 Iron deficiency anemia, unspecified: Secondary | ICD-10-CM | POA: Diagnosis not present

## 2015-10-26 DIAGNOSIS — I129 Hypertensive chronic kidney disease with stage 1 through stage 4 chronic kidney disease, or unspecified chronic kidney disease: Secondary | ICD-10-CM | POA: Diagnosis not present

## 2015-10-26 DIAGNOSIS — N186 End stage renal disease: Secondary | ICD-10-CM | POA: Diagnosis not present

## 2015-10-26 DIAGNOSIS — D631 Anemia in chronic kidney disease: Secondary | ICD-10-CM | POA: Diagnosis not present

## 2015-10-26 DIAGNOSIS — N2581 Secondary hyperparathyroidism of renal origin: Secondary | ICD-10-CM | POA: Diagnosis not present

## 2015-10-26 DIAGNOSIS — Z23 Encounter for immunization: Secondary | ICD-10-CM | POA: Diagnosis not present

## 2015-10-26 DIAGNOSIS — D509 Iron deficiency anemia, unspecified: Secondary | ICD-10-CM | POA: Diagnosis not present

## 2015-10-29 DIAGNOSIS — N2581 Secondary hyperparathyroidism of renal origin: Secondary | ICD-10-CM | POA: Diagnosis not present

## 2015-10-29 DIAGNOSIS — D509 Iron deficiency anemia, unspecified: Secondary | ICD-10-CM | POA: Diagnosis not present

## 2015-10-29 DIAGNOSIS — N186 End stage renal disease: Secondary | ICD-10-CM | POA: Diagnosis not present

## 2015-10-29 DIAGNOSIS — D631 Anemia in chronic kidney disease: Secondary | ICD-10-CM | POA: Diagnosis not present

## 2015-10-29 DIAGNOSIS — Z23 Encounter for immunization: Secondary | ICD-10-CM | POA: Diagnosis not present

## 2015-10-29 DIAGNOSIS — I129 Hypertensive chronic kidney disease with stage 1 through stage 4 chronic kidney disease, or unspecified chronic kidney disease: Secondary | ICD-10-CM | POA: Diagnosis not present

## 2015-10-31 ENCOUNTER — Encounter: Payer: Self-pay | Admitting: Internal Medicine

## 2015-10-31 ENCOUNTER — Ambulatory Visit (INDEPENDENT_AMBULATORY_CARE_PROVIDER_SITE_OTHER): Payer: Medicare Other | Admitting: Internal Medicine

## 2015-10-31 ENCOUNTER — Other Ambulatory Visit (INDEPENDENT_AMBULATORY_CARE_PROVIDER_SITE_OTHER): Payer: Medicare Other

## 2015-10-31 VITALS — BP 138/78 | HR 92 | Temp 98.7°F | Resp 20 | Wt 215.0 lb

## 2015-10-31 DIAGNOSIS — N320 Bladder-neck obstruction: Secondary | ICD-10-CM

## 2015-10-31 DIAGNOSIS — E785 Hyperlipidemia, unspecified: Secondary | ICD-10-CM | POA: Diagnosis not present

## 2015-10-31 DIAGNOSIS — I129 Hypertensive chronic kidney disease with stage 1 through stage 4 chronic kidney disease, or unspecified chronic kidney disease: Secondary | ICD-10-CM | POA: Diagnosis not present

## 2015-10-31 DIAGNOSIS — G478 Other sleep disorders: Secondary | ICD-10-CM

## 2015-10-31 DIAGNOSIS — N2581 Secondary hyperparathyroidism of renal origin: Secondary | ICD-10-CM | POA: Diagnosis not present

## 2015-10-31 DIAGNOSIS — Z23 Encounter for immunization: Secondary | ICD-10-CM | POA: Diagnosis not present

## 2015-10-31 DIAGNOSIS — Z1159 Encounter for screening for other viral diseases: Secondary | ICD-10-CM

## 2015-10-31 DIAGNOSIS — I208 Other forms of angina pectoris: Secondary | ICD-10-CM | POA: Diagnosis not present

## 2015-10-31 DIAGNOSIS — K220 Achalasia of cardia: Secondary | ICD-10-CM | POA: Diagnosis not present

## 2015-10-31 DIAGNOSIS — R7302 Impaired glucose tolerance (oral): Secondary | ICD-10-CM | POA: Diagnosis not present

## 2015-10-31 DIAGNOSIS — D509 Iron deficiency anemia, unspecified: Secondary | ICD-10-CM | POA: Diagnosis not present

## 2015-10-31 DIAGNOSIS — G4730 Sleep apnea, unspecified: Secondary | ICD-10-CM | POA: Insufficient documentation

## 2015-10-31 DIAGNOSIS — N186 End stage renal disease: Secondary | ICD-10-CM | POA: Diagnosis not present

## 2015-10-31 DIAGNOSIS — I10 Essential (primary) hypertension: Secondary | ICD-10-CM | POA: Diagnosis not present

## 2015-10-31 DIAGNOSIS — I63031 Cerebral infarction due to thrombosis of right carotid artery: Secondary | ICD-10-CM

## 2015-10-31 DIAGNOSIS — D631 Anemia in chronic kidney disease: Secondary | ICD-10-CM | POA: Diagnosis not present

## 2015-10-31 LAB — CBC WITH DIFFERENTIAL/PLATELET
BASOS ABS: 0.1 10*3/uL (ref 0.0–0.1)
Basophils Relative: 0.9 % (ref 0.0–3.0)
EOS ABS: 0.2 10*3/uL (ref 0.0–0.7)
Eosinophils Relative: 2.8 % (ref 0.0–5.0)
HCT: 35 % — ABNORMAL LOW (ref 39.0–52.0)
Hemoglobin: 11.9 g/dL — ABNORMAL LOW (ref 13.0–17.0)
Lymphocytes Relative: 20.4 % (ref 12.0–46.0)
Lymphs Abs: 1.4 10*3/uL (ref 0.7–4.0)
MCHC: 34.1 g/dL (ref 30.0–36.0)
MCV: 92.6 fl (ref 78.0–100.0)
MONOS PCT: 9.4 % (ref 3.0–12.0)
Monocytes Absolute: 0.7 10*3/uL (ref 0.1–1.0)
Neutro Abs: 4.7 10*3/uL (ref 1.4–7.7)
Neutrophils Relative %: 66.5 % (ref 43.0–77.0)
Platelets: 292 10*3/uL (ref 150.0–400.0)
RBC: 3.78 Mil/uL — AB (ref 4.22–5.81)
RDW: 15.2 % (ref 11.5–15.5)
WBC: 7.1 10*3/uL (ref 4.0–10.5)

## 2015-10-31 LAB — LIPID PANEL
Cholesterol: 274 mg/dL — ABNORMAL HIGH (ref 0–200)
HDL: 43.3 mg/dL (ref >39.00)
NonHDL: 231.13
TRIGLYCERIDES: 378 mg/dL — AB (ref 0.0–149.0)
Total CHOL/HDL Ratio: 6
VLDL: 75.6 mg/dL — ABNORMAL HIGH (ref 0.0–40.0)

## 2015-10-31 LAB — HEPATIC FUNCTION PANEL
ALBUMIN: 4.8 g/dL (ref 3.5–5.2)
ALK PHOS: 55 U/L (ref 39–117)
ALT: 9 U/L (ref 0–53)
AST: 11 U/L (ref 0–37)
Bilirubin, Direct: 0.1 mg/dL (ref 0.0–0.3)
TOTAL PROTEIN: 7.6 g/dL (ref 6.0–8.3)
Total Bilirubin: 0.5 mg/dL (ref 0.2–1.2)

## 2015-10-31 LAB — BASIC METABOLIC PANEL
BUN: 10 mg/dL (ref 6–23)
CALCIUM: 10.1 mg/dL (ref 8.4–10.5)
CO2: 34 meq/L — AB (ref 19–32)
CREATININE: 5.57 mg/dL — AB (ref 0.40–1.50)
Chloride: 93 mEq/L — ABNORMAL LOW (ref 96–112)
GFR: 13.73 mL/min — CL (ref >60.00)
GLUCOSE: 104 mg/dL — AB (ref 70–99)
Potassium: 3.4 mEq/L — ABNORMAL LOW (ref 3.5–5.1)
SODIUM: 138 meq/L (ref 135–145)

## 2015-10-31 LAB — TSH: TSH: 1.15 u[IU]/mL (ref 0.35–4.50)

## 2015-10-31 LAB — LDL CHOLESTEROL, DIRECT: Direct LDL: 166 mg/dL

## 2015-10-31 LAB — PSA: PSA: 5.82 ng/mL — AB (ref 0.10–4.00)

## 2015-10-31 LAB — HEMOGLOBIN A1C: HEMOGLOBIN A1C: 4.9 % (ref 4.6–6.5)

## 2015-10-31 MED ORDER — ZOLPIDEM TARTRATE 10 MG PO TABS: 10.0000 mg | ORAL_TABLET | Freq: Every evening | ORAL | 1 refills | Status: DC | PRN

## 2015-10-31 MED ORDER — TERAZOSIN HCL 10 MG PO CAPS: ORAL_CAPSULE | ORAL | 3 refills | Status: DC

## 2015-10-31 NOTE — Progress Notes (Signed)
Pre visit review using our clinic review tool, if applicable. No additional management support is needed unless otherwise documented below in the visit note. 

## 2015-10-31 NOTE — Progress Notes (Signed)
Subjective:    Patient ID: [name], male    DOB: Jun 01, 1960, 55 y.o.   MRN: [id number]  HPI  Here to f/u; overall doing ok,  Pt denies chest pain, increasing sob or doe, wheezing, orthopnea, PND, increased LE swelling, palpitations, dizziness or syncope.  Pt denies new neurological symptoms such as new headache, or facial or extremity weakness or numbness.  Pt denies polydipsia, polyuria, or low sugar episode.   Pt denies new neurological symptoms such as new headache, or facial or extremity weakness or numbness.   Pt states overall good compliance with meds, mostly trying to follow appropriate diet, with wt overall stable,  but little exercise however.  Denies worsening reflux, abd pain, n/v, bowel change or blood but still issues with swallowing, asks for GI f/u.  Also due for f/u with pulm for ? Sleep apnea, and neurology post cva - all lost to f/u.   Past Medical History:  Diagnosis Date  . ACHALASIA   . ANEMIA-NOS   . CAD (coronary artery disease), 3 vessel significant disease.    . CEREBROVASCULAR ACCIDENT, HX OF   . CKD (chronic kidney disease) stage 4, GFR 15-29 ml/min (HCC)   . Constipation   . DEGENERATIVE JOINT DISEASE, SHOULDER    knee  . DEPRESSION    2006- not depressed any longer  . Family history of adverse reaction to anesthesia    Mother, hard to awaken  . GERD    no meds required  . Gout    takes Uloric daily  . H/O hiatal hernia   . Headache(784.0)   . History of colon polyps   . History of diastolic dysfunction, grade 2 by echo   . HYPERLIPIDEMIA    takes crestor daily  . Hyperlipidemia   . HYPERTENSION    takes Norvasc,Catapress,Hydralazine,Imdur,and Metoprolol daily  . Impaired glucose tolerance   . Insomnia    takes Ambien nightly  . NSTEMI (non-ST elevated myocardial infarction), 06/30/11 07/01/2011  . RENAL CALCULUS, HX OF   . RENAL INSUFFICIENCY    12/2013- TThSat  . S/P coronary artery stent placement, PTCA/DES Resolute to mid-LAD via the  LIMA graft, and PTCA of apical 95% stenosis 07/05/11 07/04/2011  . S/P PTCA (percutaneous transluminal coronary angioplasty), 06/30/11 07/01/2011  . Seizures (HCC)    INSULIN INDUCED  . Sleep apnea    SLEEP STUDY IN [place] , NO MACHINE YET  1 YEAR  . Stroke (HCC)    93/2005/2006/2007;left sided weakness.  1993 12/2013   Past Surgical History:  Procedure Laterality Date  . ARCH AORTOGRAM N/A 02/19/2012   Procedure: ARCH AORTOGRAM;  Surgeon: Fransisco Hertz, MD;  Location: [facility] CATH LAB;  Service: Cardiovascular;  Laterality: N/A;  . AV FISTULA PLACEMENT  12/02/2011   Procedure: ARTERIOVENOUS (AV) FISTULA CREATION;  Surgeon: Fransisco Hertz, MD;  Location: [facility] OR;  Service: Vascular;  Laterality: Left;  Ultrasound Guided  . AV FISTULA PLACEMENT Left 06/08/2012   Procedure: ARTERIOVENOUS (AV) FISTULA CREATION;  Surgeon: Fransisco Hertz, MD;  Location: [facility] OR;  Service: Vascular;  Laterality: Left;  . BASCILIC VEIN TRANSPOSITION Left 09/06/2014   Procedure: FIRST STAGE BASILIC VEIN TRANSPOSITION ;  Surgeon: Fransisco Hertz, MD;  Location: [facility] OR;  Service: Vascular;  Laterality: Left;  . BASCILIC VEIN TRANSPOSITION Left 12/06/2014   Procedure: SECOND STAGE BRACHIAL VEIN TRANSPOSITION ;  Surgeon: Fransisco Hertz, MD;  Location[facility];  Service: Vascular;  Laterality: Left;  . CARDIAC CATHETERIZATION    .  COLONOSCOPY    . [facility]     [facility]  . CORONARY ARTERY BYPASS GRAFT  04/01/2011   Procedure: CORONARY ARTERY BYPASS GRAFTING (CABG);  Surgeon: Kathlee NationsPeter Van Suann Larryrigt III, MD;  [facility];  Service: Open Heart Surgery;  Laterality: N/A;  . HERNIA REPAIR  2013  . INSERTION OF DIALYSIS CATHETER  04/01/2011   Procedure: INSERTION OF DIALYSIS CATHETER;  Surgeon: Nilda SimmerBrian Liang-Yu Chen, MD;  [facility];  Service: Vascular;  Laterality: N/A;  . KNEE SURGERY Right 01/07/2011   arthroscopy  . LEFT HEART CATHETERIZATION WITH CORONARY ANGIOGRAM N/A 03/28/2011   Procedure: LEFT HEART CATHETERIZATION WITH  CORONARY ANGIOGRAM;  Surgeon: Lennette Biharihomas A Kelly, MD;  [facility];  Service: Cardiovascular;  Laterality: N/A;  pending creatnine  . LEFT HEART CATHETERIZATION WITH CORONARY/GRAFT ANGIOGRAM N/A 06/30/2011   Procedure: LEFT HEART CATHETERIZATION WITH Isabel CapriceORONARY/GRAFT ANGIOGRAM;  Surgeon: Thurmon FairMihai Croitoru, MD;  Location: [facility] CATH LAB;  Service: Cardiovascular;  Laterality: N/A;  . LIGATION OF ARTERIOVENOUS  FISTULA Left 06/08/2012   Procedure: LIGATION OF ARTERIOVENOUS  FISTULA;  Surgeon: Fransisco HertzBrian L Chen, MD;  [facility];  Service: Vascular;  Laterality: Left;  . NM MYOCAR PERF EJECTION FRACTION  06/25/2011   There is evidence of mild ischemia in the basal inferolateral and mid inferolateral regions. (Extent 7 %) The post-stress ejection fraction is 44%. There is mild hypocontractility in the distal inferoapical segment. baseline T wave inversion is noted in leads I, avL, II, V2. This is a low risk scan. The present perfusion study suggests significant myocardial salvage with mild residual ischemia .  Marland Kitchen. PERCUTANEOUS CORONARY INTERVENTION-BALLOON ONLY  06/30/2011   Procedure: PERCUTANEOUS CORONARY INTERVENTION-BALLOON ONLY;  Surgeon: Thurmon FairMihai Croitoru, MD;  Location: [facility] CATH LAB;  Service: Cardiovascular;;  . PERCUTANEOUS CORONARY STENT INTERVENTION (PCI-S) N/A 07/04/2011   Procedure: PERCUTANEOUS CORONARY STENT INTERVENTION (PCI-S);  Surgeon: Lennette Biharihomas A Kelly, MD;  [facility];  Service: Cardiovascular;  Laterality: N/A;  . REVISION OF ARTERIOVENOUS GORETEX GRAFT Left 12/06/2014   Procedure: REVISION OF ARTERIOVENOUS GORETEX GRAFT USING 526mmx10cm;  Surgeon: Fransisco HertzBrian L Chen, MD;  [facility];  Service: Vascular;  Laterality: Left;  . STOMACH SURGERY  2009   achalasia  . SUBCLAVIAN STENT PLACEMENT  12/20/2011  . TONSILLECTOMY      reports that he has never smoked. He quit smokeless tobacco use about 12 years ago. His smokeless tobacco use included Chew. He reports that he drinks about 1.2 oz of  alcohol per week . He reports that he does not use drugs. family history includes Cancer in his father and mother; Heart disease in his father and mother; Hyperlipidemia in his mother and other; Hypertension in his mother and other; Stroke in his other. Allergies  Allergen Reactions  . Shrimp [Shellfish Allergy] Shortness Of Breath  . Insulins Other (See Comments)    Patient unsure as to the kind of insulin but states that it has previously caused seizures. This occurred in the hospital in 2006; but in most recent hospitalization (Jan 2013) received insulin but did not have reaction  . Atorvastatin Other (See Comments)    weakness  . Simvastatin Other (See Comments)     abnormal liver tests  . Ultram [Tramadol] Other (See Comments)    Liver enzyme    Current Outpatient Prescriptions on File Prior to Visit  Medication Sig Dispense Refill  . acetaminophen (TYLENOL) 500 MG tablet Take 1,000 mg by mouth every 6 (six) hours  as needed (pain).    Marland Kitchen amLODipine (NORVASC) 5 MG tablet Take 1 tablet (5 mg total) by mouth daily. 90 tablet 3  . aspirin EC 81 MG EC tablet Take 1 tablet (81 mg total) by mouth daily.    Marland Kitchen atorvastatin (LIPITOR) 20 MG tablet Take 1 tablet (20 mg total) by mouth daily. 30 tablet 1  . cinacalcet (SENSIPAR) 30 MG tablet Take 60 mg by mouth at bedtime. Reported on 07/05/2015    . clopidogrel (PLAVIX) 75 MG tablet Take 1 tablet (75 mg total) by mouth daily. 90 tablet 1  . doxycycline (VIBRA-TABS) 100 MG tablet Take 1 tablet (100 mg total) by mouth 2 (two) times daily. 14 tablet 0  . febuxostat (ULORIC) 40 MG tablet Take 40 mg by mouth daily.     . isosorbide mononitrate (IMDUR) 60 MG 24 hr tablet Take 1/2 tablet twice daily 90 tablet 3  . lanthanum ([name]) 1000 MG chewable tablet Chew 2 tablets (2,000 mg total) by mouth 3 (three) times daily with meals. Yearly physical due in July must see MD for refills 540 tablet 0  . lidocaine-prilocaine (EMLA) cream Apply 1 application  topically as needed. Apply as needed for dialysis site    . metoprolol (LOPRESSOR) 50 MG tablet Take 1 tablet (50 mg total) by mouth 2 (two) times daily. 180 tablet 3  . NITROSTAT 0.4 MG SL tablet PLACE 1 TABLET UNDER TONGUE AS NEEDED FOR CHEST PAIN EVERY 5 MINUTES (MAX 3 DOSES) 25 tablet 4  . ticagrelor (BRILINTA) 90 MG TABS tablet Take 90 mg by mouth 2 (two) times daily.     No current facility-administered medications on file prior to visit.    Review of Systems  Constitutional: Negative for unusual diaphoresis or night sweats HENT: Negative for ear swelling or discharge Eyes: Negative for worsening visual haziness  Respiratory: Negative for choking and stridor.   Gastrointestinal: Negative for distension or worsening eructation Genitourinary: Negative for retention or change in urine volume.  Musculoskeletal: Negative for other MSK pain or swelling Skin: Negative for color change and worsening wound Neurological: Negative for tremors and numbness other than noted  Psychiatric/Behavioral: Negative for decreased concentration or agitation other than above       Objective:   Physical Exam BP 138/78   Pulse 92   Temp 98.7 F (37.1 C) (Oral)   Resp 20   Wt 215 lb (97.5 kg)   SpO2 97%   BMI 29.16 kg/m  BP 138/78   Pulse 92   Temp 98.7 F (37.1 C) (Oral)   Resp 20   Wt 215 lb (97.5 kg)   SpO2 97%   BMI 29.16 kg/m  VS noted,  Constitutional: Pt appears in no apparent distress HENT: Head: NCAT.  Right Ear: External ear normal.  Left Ear: External ear normal.  Eyes: . Pupils are equal, round, and reactive to light. Conjunctivae and EOM are normal Neck: Normal range of motion. Neck supple.  Cardiovascular: Normal rate and regular rhythm.   Pulmonary/Chest: Effort normal and breath sounds without rales or wheezing.  Abd:  Soft, NT, ND, + BS Neurological: Pt is alert. at baselne confused  Skin: Skin is warm. No rash, no LE edema Psychiatric: Pt behavior is normal. No  agitation.     Assessment & Plan:

## 2015-10-31 NOTE — Patient Instructions (Signed)
You had the flu shot today  Please continue all other medications as before, and refills have been done if requested.  Please have the pharmacy call with any other refills you may need.  Please continue your efforts at being more active, low cholesterol diet, and weight control.  You are otherwise up to date with prevention measures today.  Please keep your appointments with your specialists as you may have planned  You will be contacted regarding the referral for: Gastroenterology, Neurology, and Pulmonary  Please go to the LAB in the Basement (turn left off the elevator) for the tests to be done today  You will be contacted by phone if any changes need to be made immediately.  Otherwise, you will receive a letter about your results with an explanation, but please check with MyChart first.  Please remember to sign up for MyChart if you have not done so, as this will be important to you in the future with finding out test results, communicating by private email, and scheduling acute appointments online when needed.  Please return in 6 months, or sooner if needed

## 2015-11-01 LAB — HEPATITIS C ANTIBODY: HCV Ab: NEGATIVE

## 2015-11-02 DIAGNOSIS — N2581 Secondary hyperparathyroidism of renal origin: Secondary | ICD-10-CM | POA: Diagnosis not present

## 2015-11-02 DIAGNOSIS — D631 Anemia in chronic kidney disease: Secondary | ICD-10-CM | POA: Diagnosis not present

## 2015-11-02 DIAGNOSIS — I129 Hypertensive chronic kidney disease with stage 1 through stage 4 chronic kidney disease, or unspecified chronic kidney disease: Secondary | ICD-10-CM | POA: Diagnosis not present

## 2015-11-02 DIAGNOSIS — D509 Iron deficiency anemia, unspecified: Secondary | ICD-10-CM | POA: Diagnosis not present

## 2015-11-02 DIAGNOSIS — Z23 Encounter for immunization: Secondary | ICD-10-CM | POA: Diagnosis not present

## 2015-11-02 DIAGNOSIS — N186 End stage renal disease: Secondary | ICD-10-CM | POA: Diagnosis not present

## 2015-11-04 NOTE — Assessment & Plan Note (Signed)
Asympt, stable overall by history and exam, recent data reviewed with pt, and pt to continue medical treatment as before,  to f/u any worsening symptoms or concerns  Lab Results  Component Value Date   HGBA1C 4.9 10/31/2015

## 2015-11-04 NOTE — Assessment & Plan Note (Signed)
Ok for GI referral?  

## 2015-11-04 NOTE — Assessment & Plan Note (Signed)
Stable, for f/u neurology, cont same tx - asa, plavix Current Outpatient Prescriptions on File Prior to Visit  Medication Sig Dispense Refill  . acetaminophen (TYLENOL) 500 MG tablet Take 1,000 mg by mouth every 6 (six) hours as needed (pain).    Marland Kitchen amLODipine (NORVASC) 5 MG tablet Take 1 tablet (5 mg total) by mouth daily. 90 tablet 3  . aspirin EC 81 MG EC tablet Take 1 tablet (81 mg total) by mouth daily.    Marland Kitchen atorvastatin (LIPITOR) 20 MG tablet Take 1 tablet (20 mg total) by mouth daily. 30 tablet 1  . cinacalcet (SENSIPAR) 30 MG tablet Take 60 mg by mouth at bedtime. Reported on 07/05/2015    . clopidogrel (PLAVIX) 75 MG tablet Take 1 tablet (75 mg total) by mouth daily. 90 tablet 1  . doxycycline (VIBRA-TABS) 100 MG tablet Take 1 tablet (100 mg total) by mouth 2 (two) times daily. 14 tablet 0  . febuxostat (ULORIC) 40 MG tablet Take 40 mg by mouth daily.     . isosorbide mononitrate (IMDUR) 60 MG 24 hr tablet Take 1/2 tablet twice daily 90 tablet 3  . lanthanum ([name]) 1000 MG chewable tablet Chew 2 tablets (2,000 mg total) by mouth 3 (three) times daily with meals. Yearly physical due in July must see MD for refills 540 tablet 0  . lidocaine-prilocaine (EMLA) cream Apply 1 application topically as needed. Apply as needed for dialysis site    . metoprolol (LOPRESSOR) 50 MG tablet Take 1 tablet (50 mg total) by mouth 2 (two) times daily. 180 tablet 3  . NITROSTAT 0.4 MG SL tablet PLACE 1 TABLET UNDER TONGUE AS NEEDED FOR CHEST PAIN EVERY 5 MINUTES (MAX 3 DOSES) 25 tablet 4  . ticagrelor (BRILINTA) 90 MG TABS tablet Take 90 mg by mouth 2 (two) times daily.     No current facility-administered medications on file prior to visit.

## 2015-11-04 NOTE — Assessment & Plan Note (Signed)
stable overall by history and exam, recent data reviewed with pt, and pt to continue medical treatment as before,  to f/u any worsening symptoms or concerns Lab Results  Component Value Date   LDLCALC 89 12/30/2013

## 2015-11-04 NOTE — Assessment & Plan Note (Signed)
Also for pulm referral - r/o osa

## 2015-11-04 NOTE — Assessment & Plan Note (Signed)
stable overall by history and exam, recent data reviewed with pt, and pt to continue medical treatment as before,  to f/u any worsening symptoms or concerns BP Readings from Last 3 Encounters:  10/31/15 138/78  07/27/15 (!) 160/104  07/17/15 (!) 160/100

## 2015-11-04 NOTE — Assessment & Plan Note (Signed)
Also for psa as he is due 

## 2015-11-05 DIAGNOSIS — N2581 Secondary hyperparathyroidism of renal origin: Secondary | ICD-10-CM | POA: Diagnosis not present

## 2015-11-05 DIAGNOSIS — I129 Hypertensive chronic kidney disease with stage 1 through stage 4 chronic kidney disease, or unspecified chronic kidney disease: Secondary | ICD-10-CM | POA: Diagnosis not present

## 2015-11-05 DIAGNOSIS — Z23 Encounter for immunization: Secondary | ICD-10-CM | POA: Diagnosis not present

## 2015-11-05 DIAGNOSIS — D509 Iron deficiency anemia, unspecified: Secondary | ICD-10-CM | POA: Diagnosis not present

## 2015-11-05 DIAGNOSIS — D631 Anemia in chronic kidney disease: Secondary | ICD-10-CM | POA: Diagnosis not present

## 2015-11-05 DIAGNOSIS — N186 End stage renal disease: Secondary | ICD-10-CM | POA: Diagnosis not present

## 2015-11-07 DIAGNOSIS — Z23 Encounter for immunization: Secondary | ICD-10-CM | POA: Diagnosis not present

## 2015-11-07 DIAGNOSIS — I129 Hypertensive chronic kidney disease with stage 1 through stage 4 chronic kidney disease, or unspecified chronic kidney disease: Secondary | ICD-10-CM | POA: Diagnosis not present

## 2015-11-07 DIAGNOSIS — D509 Iron deficiency anemia, unspecified: Secondary | ICD-10-CM | POA: Diagnosis not present

## 2015-11-07 DIAGNOSIS — N2581 Secondary hyperparathyroidism of renal origin: Secondary | ICD-10-CM | POA: Diagnosis not present

## 2015-11-07 DIAGNOSIS — N186 End stage renal disease: Secondary | ICD-10-CM | POA: Diagnosis not present

## 2015-11-07 DIAGNOSIS — D631 Anemia in chronic kidney disease: Secondary | ICD-10-CM | POA: Diagnosis not present

## 2015-11-08 ENCOUNTER — Ambulatory Visit (INDEPENDENT_AMBULATORY_CARE_PROVIDER_SITE_OTHER): Payer: Medicare Other

## 2015-11-08 ENCOUNTER — Encounter: Payer: Self-pay | Admitting: Internal Medicine

## 2015-11-08 VITALS — BP 120/80 | HR 57 | Ht 72.0 in | Wt 213.2 lb

## 2015-11-08 DIAGNOSIS — Z0000 Encounter for general adult medical examination without abnormal findings: Secondary | ICD-10-CM

## 2015-11-08 NOTE — Patient Instructions (Addendum)
[name] , Thank you for taking time to come for your Medicare Wellness Visit. I appreciate your ongoing commitment to your health goals. Please review the following plan we discussed and let me know if I can assist you in the future.   Will check on scheduling colonoscopy upstairs today; Due now   Work on a fall plan;   Call [facility] [phone] and ask about med alert or lifeline in [place];   Personal safety issues reviewed:  1.  for risk such as safe community; Can start community watch per [facility] 2.  smoke detector and carbon monoxide detector should be in the home 3.  If you have firearms; keep them in a safe place 4. protection when in the sun; Always wear sunscreen or a hat; It is good to see Dermatologist or have your doctor check your skin annually  5. Driving safety; Keep in the right lane; stay 3 car lengths behind the car in front of you on the highway; look 3 times prior to pulling out; carry your cell phone everywhere you go!   Learn about the Yellow Dot program! Taken from the [facility] office online:  The program consist of a yellow dot that would be placed on the back left window of a motor vehicle and/or on the front door of a residence. The applicant then completes a document containing emergency information along with a photograph of the person of which the history is about. After completion the citizen would then place a copy of the Emergency Information Sheet, of which they have completed inside the glove compartment and/or on the refrigerator within the residence. The yellow dot identifies that the information is available to First Responders and the common designated locations allows them to know where to retrieve the vital medical information. This information allows First Responders to know how to handle a patient that may have a certain medical condition(s) and what medications the patient may be prescribed. Often times when  the patient does not have a family member present this information is unknown. Even patients involved in minor motor vehicle accidents in which the air bag has been deployed are stunned and cannot recall vital medical information for the emergency personnel. This program is also going one step further by allowing patients suffering from Dementia, Alzheimer's, Autism and Cognitive impairments to return a copy of the Information to the [facility] Office and we will add this information to the [employer] data banks. This will allow dispatchers to notify first responders of such situations as they are being dispatched to a residence. With this information being available to first responders it allows them to assist people in automobile accidents or those that may have an at home injury or an illness when time is of the essence. This often is referred to as the "[employer] of Emergency Care". Citizens requesting the Yellow Dot Packages should contact Master Corporal Gae Dry at the [facility] [phone] for the first week of the program and beginning the week after Easter citizens should contact their Water engineer.    These are the goals we discussed: Goals    . get a kidney (pt-stated)          Will discuss with doctor;      . medication adherence (pt-stated)          Will fup on mail order program to see if his meds will be more economical for his budget  Will try to stay on anti-clotting medication    . patient          Lose 10 lbs  Back to the Gym;  Portion control        This is a list of the screening recommended for you and due dates:  Health Maintenance  Topic Date Due  . Colon Cancer Screening  10/04/2015  . Tetanus Vaccine  05/23/2018  . Flu Shot  Completed  .  Hepatitis C: One time screening is recommended by Center for Disease Control  (CDC) for  adults born from [birth year] through 1965.   Completed  . HIV Screening  Completed       Colonoscopy A colonoscopy is an exam to look at the entire large intestine (colon). This exam can help find problems such as tumors, polyps, inflammation, and areas of bleeding. The exam takes about 1 hour.  LET [facility] PROVID[facility] KNOW ABOUT:   Any allergies you have.  All medicines you are taking, including vitamins, herbs, eye drops, creams, and over-the-counter medicines.  Previous problems you or members of your family have had with the use of anesthetics.  Any blood disorders you have.  Previous surgeries you have had.  Medical conditions you have. RISKS AND COMPLICATIONS  Generally, this is a safe procedure. However, as with any procedure, complications can occur. Possible complications include:  Bleeding.  Tearing or rupture of the colon wall.  Reaction to medicines given during the exam.  Infection (rare). BEFORE THE PROCEDURE   Ask your health care provider about changing or stopping your regular medicines.  You may be prescribed an oral bowel prep. This involves drinking a large amount of medicated liquid, starting the day before your procedure. The liquid will cause you to have multiple loose stools until your stool is almost clear or light green. This cleans out your colon in preparation for the procedure.  Do not eat or drink anything else once you have started the bowel prep, unless your health care provider tells you it is safe to do so.  Arrange for someone to drive you home after the procedure. PROCEDURE   You will be given medicine to help you relax (sedative).  You will lie on your side with your knees bent.  A long, flexible tube with a light and camera on the end (colonoscope) will be inserted through the rectum and into the colon. The camera sends video back to a computer screen as it moves through the colon. The colonoscope also releases carbon dioxide gas to inflate the colon. This helps your health care provider see the area  better.  During the exam, your health care provider may take a small tissue sample (biopsy) to be examined under a microscope if any abnormalities are found.  The exam is finished when the entire colon has been viewed. AFTER THE PROCEDURE   Do not drive for 24 hours after the exam.  You may have a small amount of blood in your stool.  You may pass moderate amounts of gas and have mild abdominal cramping or bloating. This is caused by the gas used to inflate your colon during the exam.  Ask when your test results will be ready and how you will get your results. Make sure you get your test results.   This information is not intended to replace advice given to you by your health care provider. Make sure you discuss any questions you have with your health care provider.  Document Released: 02/29/2000 Document Revised: 12/22/2012 Document Reviewed: 11/08/2012 Elsevier Interactive Patient Education 2016 [employer].   Fall Prevention in the Home  Falls can cause injuries. They can happen to people of all ages. There are many things you can do to make your home safe and to help prevent falls.  WHAT CAN I DO ON THE OUTSIDE OF MY HOME?  Regularly fix the edges of walkways and driveways and fix any cracks.  Remove anything that might make you trip as you walk through a door, such as a raised step or threshold.  Trim any bushes or trees on the path to your home.  Use bright outdoor lighting.  Clear any walking paths of anything that might make someone trip, such as rocks or tools.  Regularly check to see if handrails are loose or broken. Make sure that both sides of any steps have handrails.  Any raised decks and porches should have guardrails on the edges.  Have any leaves, snow, or ice cleared regularly.  Use sand or salt on walking paths during winter.  Clean up any spills in your garage right away. This includes oil or grease spills. WHAT CAN I DO IN THE BATHROOM?   Use  night lights.  Install grab bars by the toilet and in the tub and shower. Do not use towel bars as grab bars.  Use non-skid mats or decals in the tub or shower.  If you need to sit down in the shower, use a plastic, non-slip stool.  Keep the floor dry. Clean up any water that spills on the floor as soon as it happens.  Remove soap buildup in the tub or shower regularly.  Attach bath mats securely with double-sided non-slip rug tape.  Do not have throw rugs and other things on the floor that can make you trip. WHAT CAN I DO IN THE BEDROOM?  Use night lights.  Make sure that you have a light by your bed that is easy to reach.  Do not use any sheets or blankets that are too big for your bed. They should not hang down onto the floor.  Have a firm chair that has side arms. You can use this for support while you get dressed.  Do not have throw rugs and other things on the floor that can make you trip. WHAT CAN I DO IN THE KITCHEN?  Clean up any spills right away.  Avoid walking on wet floors.  Keep items that you use a lot in easy-to-reach places.  If you need to reach something above you, use a strong step stool that has a grab bar.  Keep electrical cords out of the way.  Do not use floor polish or wax that makes floors slippery. If you must use wax, use non-skid floor wax.  Do not have throw rugs and other things on the floor that can make you trip. WHAT CAN I DO WITH MY STAIRS?  Do not leave any items on the stairs.  Make sure that there are handrails on both sides of the stairs and use them. Fix handrails that are broken or loose. Make sure that handrails are as long as the stairways.  Check any carpeting to make sure that it is firmly attached to the stairs. Fix any carpet that is loose or worn.  Avoid having throw rugs at the top or bottom of the stairs. If you do have throw rugs, attach them to the floor with carpet tape.  Make sure  that you have a light switch at  the top of the stairs and the bottom of the stairs. If you do not have them, ask someone to add them for you. WHAT ELSE CAN I DO TO HELP PREVENT FALLS?  Wear shoes that:  Do not have high heels.  Have rubber bottoms.  Are comfortable and fit you well.  Are closed at the toe. Do not wear sandals.  If you use a stepladder:  Make sure that it is fully opened. Do not climb a closed stepladder.  Make sure that both sides of the stepladder are locked into place.  Ask someone to hold it for you, if possible.  Clearly mark and make sure that you can see:  Any grab bars or handrails.  First and last steps.  Where the edge of each step is.  Use tools that help you move around (mobility aids) if they are needed. These include:  Canes.  Walkers.  Scooters.  Crutches.  Turn on the lights when you go into a dark area. Replace any light bulbs as soon as they burn out.  Set up your furniture so you have a clear path. Avoid moving your furniture around.  If any of your floors are uneven, fix them.  If there are any pets around you, be aware of where they are.  Review your medicines with your doctor. Some medicines can make you feel dizzy. This can increase your chance of falling. Ask your doctor what other things that you can do to help prevent falls.   This information is not intended to replace advice given to you by your health care provider. Make sure you discuss any questions you have with your health care provider.   Document Released: 12/28/2008 Document Revised: 07/18/2014 Document Reviewed: 04/07/2014 Elsevier Interactive Patient Education 2016 [employer].  Health Maintenance, Male A healthy lifestyle and preventative care can promote health and wellness.  Maintain regular health, dental, and eye exams.  Eat a healthy diet. Foods like vegetables, fruits, whole grains, low-fat dairy products, and lean protein foods contain the nutrients you need and are low in  calories. Decrease your intake of foods high in solid fats, added sugars, and salt. Get information about a proper diet from your health care provider, if necessary.  Regular physical exercise is one of the most important things you can do for your health. Most adults should get at least 150 minutes of moderate-intensity exercise (any activity that increases your heart rate and causes you to sweat) each week. In addition, most adults need muscle-strengthening exercises on 2 or more days a week.   Maintain a healthy weight. The body mass index (BMI) is a screening tool to identify possible weight problems. It provides an estimate of body fat based on height and weight. Your health care provider can find your BMI and can help you achieve or maintain a healthy weight. For males 20 years and older:  A BMI below 18.5 is considered underweight.  A BMI of 18.5 to 24.9 is normal.  A BMI of 25 to 29.9 is considered overweight.  A BMI of 30 and above is considered obese.  Maintain normal blood lipids and cholesterol by exercising and minimizing your intake of saturated fat. Eat a balanced diet with plenty of fruits and vegetables. Blood tests for lipids and cholesterol should begin at age 60 and be repeated every 5 years. If your lipid or cholesterol levels are high, you are over age 47, or you are  at high risk for heart disease, you may need your cholesterol levels checked more frequently.Ongoing high lipid and cholesterol levels should be treated with medicines if diet and exercise are not working.  If you smoke, find out from your health care provider how to quit. If you do not use tobacco, do not start.  Lung cancer screening is recommended for adults aged 55-80 years who are at high risk for developing lung cancer because of a history of smoking. A yearly low-dose CT scan of the lungs is recommended for people who have at least a 30-pack-year history of smoking and are current smokers or have quit  within the past 15 years. A pack year of smoking is smoking an average of 1 pack of cigarettes a day for 1 year (for example, a 30-pack-year history of smoking could mean smoking 1 pack a day for 30 years or 2 packs a day for 15 years). Yearly screening should continue until the smoker has stopped smoking for at least 15 years. Yearly screening should be stopped for people who develop a health problem that would prevent them from having lung cancer treatment.  If you choose to drink alcohol, do not have more than 2 drinks per day. One drink is considered to be 12 oz (360 mL) of beer, 5 oz (150 mL) of wine, or 1.5 oz (45 mL) of liquor.  Avoid the use of street drugs. Do not share needles with anyone. Ask for help if you need support or instructions about stopping the use of drugs.  High blood pressure causes heart disease and increases the risk of stroke. High blood pressure is more likely to develop in:  People who have blood pressure in the end of the normal range (100-139/85-89 mm Hg).  People who are overweight or obese.  People who are African American.  If you are 42-42 years of age, have your blood pressure checked every 3-5 years. If you are 21 years of age or older, have your blood pressure checked every year. You should have your blood pressure measured twice--once when you are at a hospital or clinic, and once when you are not at a hospital or clinic. Record the average of the two measurements. To check your blood pressure when you are not at a hospital or clinic, you can use:  An automated blood pressure machine at a pharmacy.  A home blood pressure monitor.  If you are 35-48 years old, ask your health care provider if you should take aspirin to prevent heart disease.  Diabetes screening involves taking a blood sample to check your fasting blood sugar level. This should be done once every 3 years after age 71 if you are at a normal weight and without risk factors for diabetes.  Testing should be considered at a younger age or be carried out more frequently if you are overweight and have at least 1 risk factor for diabetes.  Colorectal cancer can be detected and often prevented. Most routine colorectal cancer screening begins at the age of 62 and continues through age 64. However, your health care provider may recommend screening at an earlier age if you have risk factors for colon cancer. On a yearly basis, your health care provider may provide home test kits to check for hidden blood in the stool. A small camera at the end of a tube may be used to directly examine the colon (sigmoidoscopy or colonoscopy) to detect the earliest forms of colorectal cancer. Talk to your health care  provider about this at age 57 when routine screening begins. A direct exam of the colon should be repeated every 5-10 years through age 35, unless early forms of precancerous polyps or small growths are found.  People who are at an increased risk for hepatitis B should be screened for this virus. You are considered at high risk for hepatitis B if:  You were born in a country where hepatitis B occurs often. Talk with your health care provider about which countries are considered high risk.  Your parents were born in a high-risk country and you have not received a shot to protect against hepatitis B (hepatitis B vaccine).  You have HIV or AIDS.  You use needles to inject street drugs.  You live with, or have sex with, someone who has hepatitis B.  You are a man who has sex with other men (MSM).  You get hemodialysis treatment.  You take certain medicines for conditions like cancer, organ transplantation, and autoimmune conditions.  Hepatitis C blood testing is recommended for all people born from [birth year] through 1965 and any individual with known risk factors for hepatitis C.  Healthy men should no longer receive prostate-specific antigen (PSA) blood tests as part of routine cancer screening.  Talk to your health care provider about prostate cancer screening.  Testicular cancer screening is not recommended for adolescents or adult males who have no symptoms. Screening includes self-exam, a health care provider exam, and other screening tests. Consult with your health care provider about any symptoms you have or any concerns you have about testicular cancer.  Practice safe sex. Use condoms and avoid high-risk sexual practices to reduce the spread of sexually transmitted infections (STIs).  You should be screened for STIs, including gonorrhea and chlamydia if:  You are sexually active and are younger than 24 years.  You are older than 24 years, and your health care provider tells you that you are at risk for this type of infection.  Your sexual activity has changed since you were last screened, and you are at an increased risk for chlamydia or gonorrhea. Ask your health care provider if you are at risk.  If you are at risk of being infected with HIV, it is recommended that you take a prescription medicine daily to prevent HIV infection. This is called pre-exposure prophylaxis (PrEP). You are considered at risk if:  You are a man who has sex with other men (MSM).  You are a heterosexual man who is sexually active with multiple partners.  You take drugs by injection.  You are sexually active with a partner who has HIV.  Talk with your health care provider about whether you are at high risk of being infected with HIV. If you choose to begin PrEP, you should first be tested for HIV. You should then be tested every 3 months for as long as you are taking PrEP.  Use sunscreen. Apply sunscreen liberally and repeatedly throughout the day. You should seek shade when your shadow is shorter than you. Protect yourself by wearing long sleeves, pants, a wide-brimmed hat, and sunglasses year round whenever you are outdoors.  Tell your health care provider of new moles or changes in moles,  especially if there is a change in shape or color. Also, tell your health care provider if a mole is larger than the size of a pencil eraser.  A one-time screening for abdominal aortic aneurysm (AAA) and surgical repair of large AAAs by ultrasound is recommended for men  aged 39-75 years who are current or former smokers.  Stay current with your vaccines (immunizations).   This information is not intended to replace advice given to you by your health care provider. Make sure you discuss any questions you have with your health care provider.   Document Released: 08/30/2007 Document Revised: 03/24/2014 Document Reviewed: 07/29/2010 Elsevier Interactive Patient Education [employer]

## 2015-11-08 NOTE — Progress Notes (Addendum)
Subjective:   DELDRICK Glenn is a 55 y.o. male who presents for Medicare Annual/Subsequent preventive examination.   HRA assessment completed during this visit with [name]  The Patient was informed that the wellness visit is to identify future health risk and educate and initiate measures that can reduce risk for increased disease through the lifespan.    NO ROS; Medicare Wellness Visit Feels health is good Getting better; has a great attitude; hopes to get a  kidney  What improved; body is getting used to diaylsis  since oct  Psychosocial: (mother had cancer; HD; chol; htn;  Father had cancer; HD ) Lipids; LDL 166 / Triglycerides 378 A1c 4.9  PSA slightly elevated   Medications reviewed for issues; compliance; otc meds  BMI: 28 Trying to waist down to 36; now 38  Recommended 5 to 10 lbs;    Diet Breakfast; take toasted crunch cereal Eggs and bacon Lunch; leftovers  Likes to cook; chicken, hamburger and corn Appetite is good; Watches sodium   Dental work: have dental work  Cytogeneticist; given resources for Engineer, petroleum;  Used to go to [employer];   Teacher, English as a foreign language presses; and leg presses and squats Riding bikes at [employer] Agrees to go back to exercise   HOME SAFETY Long term plan reviewed /  Lives at mother home; single family home on one level Home: level; barriers; or needs identified as bathroom railing or other review; In tub to shower  Fall hx; yes; legs gave way; in Living room; Weakness in left leg; oct 15 had stroke  Total of 6 strokes; 2 MI  Hx 4 falls due to left leg weakness; discussed fall safety and lifeline  Given information for the later   Given education on "Fall Prevention in the Home" for more safety tips the patient can apply as appropriate.   Personal safety issues reviewed:  1.  for risk such as safe community/communmity is safe 2. If he falls or emergency, he could call;  3.  smoke detector/  4.  firearms safety  if applicable   5. protection when in the sun; uses hats  6. driving safety for seniors or any recent accidents. NO    Risk for Depression reviewed: Any emotional problems? Anxious, depressed, irritable, sad or blue? no Denies feeling depressed or hopeless; voices pleasure in daily life How many social activities have you been engaged in within the last 2 weeks? no   Cognitive; memory  Manages checkbook, medications; no failures of task Ad8 score reviewed for issues;  Issues making decisions; some days; sister assist with financial issues   Less interest in hobbies / activities" no  Repeats questions, stories; family complaining: repeats himself sometimes  Trouble using ordinary gadgets; microwave; computer: no  Forgets the month or year: no  Mismanaging finances: yes ; sister helps   Missing apt: no but does write them down  Daily problems with thinking of memory NO Ad8 score is 0  MMSE not appropriate unless AD8 score is > 2  No failures of independent living; Encouraged to go back to the fitness center for socialization as well; sister states he "sleeps" a lot but the patient denies;  States he just rest when he is tired   Advanced Directive addressed; yes   Counseling Health Maintenance Gaps:  Colonoscopy; DUe 09/2015/ Has not heard from anyone but agreed to go upstairs today and check on scheduling  EKG: 07/2015  Prostate cancer screening: completed; was  on antibiotic but off now    Hearing: 4000hz  both ears Right ear  Ophthalmology exam/ once a year [name]   Immunizations Due: (Vaccines reviewed and educated regarding any overdue) already had his flu shot this year   Established and updated Risk reviewed and appropriate referral made or health recommendations:        Objective:    Vitals: BP 120/80   Pulse (!) 57   Ht 6' (1.829 m)   Wt 213 lb 4 oz (96.7 kg)   SpO2 93%   BMI 28.92 kg/m   Body mass index is 28.92  kg/m.  Tobacco History  Smoking Status  . Never Smoker  Smokeless Tobacco  . Former Neurosurgeon  . Types: Chew  . Quit date: 03/18/2003    Comment: chewed tobacco      Counseling given: Yes   Past Medical History:  Diagnosis Date  . ACHALASIA   . ANEMIA-NOS   . CAD (coronary artery disease), 3 vessel significant disease.    . CEREBROVASCULAR ACCIDENT, HX OF   . CKD (chronic kidney disease) stage 4, GFR 15-29 ml/min (HCC)   . Constipation   . DEGENERATIVE JOINT DISEASE, SHOULDER    knee  . DEPRESSION    2006- not depressed any longer  . Family history of adverse reaction to anesthesia    Mother, hard to awaken  . GERD    no meds required  . Gout    takes Uloric daily  . H/O hiatal hernia   . Headache(784.0)   . History of colon polyps   . History of diastolic dysfunction, grade 2 by echo   . HYPERLIPIDEMIA    takes crestor daily  . Hyperlipidemia   . HYPERTENSION    takes Norvasc,Catapress,Hydralazine,Imdur,and Metoprolol daily  . Impaired glucose tolerance   . Insomnia    takes Ambien nightly  . NSTEMI (non-ST elevated myocardial infarction), 06/30/11 07/01/2011  . RENAL CALCULUS, HX OF   . RENAL INSUFFICIENCY    12/2013- TThSat  . S/P coronary artery stent placement, PTCA/DES Resolute to mid-LAD via the LIMA graft, and PTCA of apical 95% stenosis 07/05/11 07/04/2011  . S/P PTCA (percutaneous transluminal coronary angioplasty), 06/30/11 07/01/2011  . Seizures (HCC)    INSULIN INDUCED  . Sleep apnea    SLEEP STUDY IN [place] , NO MACHINE YET  1 YEAR  . Stroke (HCC)    93/2005/2006/2007;left sided weakness.  1993 12/2013   Past Surgical History:  Procedure Laterality Date  . ARCH AORTOGRAM N/A 02/19/2012   Procedure: ARCH AORTOGRAM;  Surgeon: Fransisco Hertz, MD;  Location: [facility] CATH LAB;  Service: Cardiovascular;  Laterality: N/A;  . AV FISTULA PLACEMENT  12/02/2011   Procedure: ARTERIOVENOUS (AV) FISTULA CREATION;  Surgeon: Fransisco Hertz, MD;  Location: [facility] OR;  Service:  Vascular;  Laterality: Left;  Ultrasound Guided  . AV FISTULA PLACEMENT Left 06/08/2012   Procedure: ARTERIOVENOUS (AV) FISTULA CREATION;  Surgeon: Fransisco Hertz, MD;  Location: [facility] OR;  Service: Vascular;  Laterality: Left;  . BASCILIC VEIN TRANSPOSITION Left 09/06/2014   Procedure: FIRST STAGE BASILIC VEIN TRANSPOSITION ;  Surgeon: Fransisco Hertz, MD;  Location: [facility] OR;  Service: Vascular;  Laterality: Left;  . BASCILIC VEIN TRANSPOSITION Left 12/06/2014   Procedure: SECOND STAGE BRACHIAL VEIN TRANSPOSITION ;  Surgeon: Fransisco Hertz, MD;  Location: [facility] OR;  Service: Vascular;  Laterality: Left;  . CARDIAC CATHETERIZATION    . COLONOSCOPY    . CORONARY ANGIOPLASTY  06/30/11   AT [facility]  . CORONARY ARTERY BYPASS GRAFT  04/01/2011   Procedure: CORONARY ARTERY BYPASS GRAFTING (CABG);  Surgeon: Kathlee Nations Suann Larry, MD;  Location: [facility] OR;  Service: Open Heart Surgery;  Laterality: N/A;  . HERNIA REPAIR  2013  . INSERTION OF DIALYSIS CATHETER  04/01/2011   Procedure: INSERTION OF DIALYSIS CATHETER;  Surgeon: Nilda Simmer, MD;  Location: [facility] OR;  Service: Vascular;  Laterality: N/A;  . KNEE SURGERY Right 01/07/2011   arthroscopy  . LEFT HEART CATHETERIZATION WITH CORONARY ANGIOGRAM N/A 03/28/2011   Procedure: LEFT HEART CATHETERIZATION WITH CORONARY ANGIOGRAM;  Surgeon: Lennette Bihari, MD;  Location: [facility] CATH LAB;  Service: Cardiovascular;  Laterality: N/A;  pending creatnine  . LEFT HEART CATHETERIZATION WITH CORONARY/GRAFT ANGIOGRAM N/A 06/30/2011   Procedure: LEFT HEART CATHETERIZATION WITH Isabel Caprice;  Surgeon: Thurmon Fair, MD;  Location: [facility] CATH LAB;  Service: Cardiovascular;  Laterality: N/A;  . LIGATION OF ARTERIOVENOUS  FISTULA Left 06/08/2012   Procedure: LIGATION OF ARTERIOVENOUS  FISTULA;  Surgeon: Fransisco Hertz, MD;  Location: [facility] OR;  Service: Vascular;  Laterality: Left;  . NM MYOCAR PERF EJECTION FRACTION  06/25/2011   There is evidence of mild ischemia in the basal inferolateral  and mid inferolateral regions. (Extent 7 %) The post-stress ejection fraction is 44%. There is mild hypocontractility in the distal inferoapical segment. baseline T wave inversion is noted in leads I, avL, II, V2. This is a low risk scan. The present perfusion study suggests significant myocardial salvage with mild residual ischemia .  Marland Kitchen PERCUTANEOUS CORONARY INTERVENTION-BALLOON ONLY  06/30/2011   Procedure: PERCUTANEOUS CORONARY INTERVENTION-BALLOON ONLY;  Surgeon: Thurmon Fair, MD;  Location: [facility] CATH LAB;  Service: Cardiovascular;;  . PERCUTANEOUS CORONARY STENT INTERVENTION (PCI-S) N/A 07/04/2011   Procedure: PERCUTANEOUS CORONARY STENT INTERVENTION (PCI-S);  Surgeon: Lennette Bihari, MD;  Location: [facility] CATH LAB;  Service: Cardiovascular;  Laterality: N/A;  . REVISION OF ARTERIOVENOUS GORETEX GRAFT Left 12/06/2014   Procedure: REVISION OF ARTERIOVENOUS GORETEX GRAFT USING 28mmx10cm;  Surgeon: Fransisco Hertz, MD;  Location: [facility] OR;  Service: Vascular;  Laterality: Left;  . STOMACH SURGERY  2009   achalasia  . SUBCLAVIAN STENT PLACEMENT  12/20/2011  . TONSILLECTOMY     Family History  Problem Relation Age of Onset  . Cancer Mother   . Heart disease Mother   . Hyperlipidemia Mother   . Hypertension Mother   . Cancer Father   . Heart disease Father   . Hypertension Other   . Stroke Other   . Hyperlipidemia Other    History  Sexual Activity  . Sexual activity: Not Currently  . Partners: Male    Outpatient Encounter Prescriptions as of 11/08/2015  Medication Sig  . acetaminophen (TYLENOL) 500 MG tablet Take 1,000 mg by mouth every 6 (six) hours as needed (pain).  Marland Kitchen amLODipine (NORVASC) 5 MG tablet Take 1 tablet (5 mg total) by mouth daily.  Marland Kitchen aspirin EC 81 MG EC tablet Take 1 tablet (81 mg total) by mouth daily.  Marland Kitchen atorvastatin (LIPITOR) 20 MG tablet Take 1 tablet (20 mg total) by mouth daily.  . cinacalcet (SENSIPAR) 30 MG tablet Take 60 mg by mouth at bedtime. Reported on 07/05/2015  .  clopidogrel (PLAVIX) 75 MG tablet Take 1 tablet (75 mg total) by mouth daily.  . febuxostat (ULORIC) 40 MG tablet Take 40 mg by mouth daily.   . isosorbide mononitrate (IMDUR) 60 MG 24 hr tablet Take 1/2 tablet  twice daily  . lanthanum ([name]) 1000 MG chewable tablet Chew 2 tablets (2,000 mg total) by mouth 3 (three) times daily with meals. Yearly physical due in July must see MD for refills  . lidocaine-prilocaine (EMLA) cream Apply 1 application topically as needed. Apply as needed for dialysis site  . metoprolol (LOPRESSOR) 50 MG tablet Take 1 tablet (50 mg total) by mouth 2 (two) times daily.  Marland Kitchen. NITROSTAT 0.4 MG SL tablet PLACE 1 TABLET UNDER TONGUE AS NEEDED FOR CHEST PAIN EVERY 5 MINUTES (MAX 3 DOSES)  . terazosin (HYTRIN) 10 MG capsule TAKE 1 CAPSULE BY MOUTH DAILY AT BEDTIME  . ticagrelor (BRILINTA) 90 MG TABS tablet Take 90 mg by mouth 2 (two) times daily.  Marland Kitchen. zolpidem (AMBIEN) 10 MG tablet Take 1 tablet (10 mg total) by mouth at bedtime as needed for sleep. Please make return office visit for further refills  . doxycycline (VIBRA-TABS) 100 MG tablet Take 1 tablet (100 mg total) by mouth 2 (two) times daily. (Patient not taking: Reported on 11/08/2015)   No facility-administered encounter medications on file as of 11/08/2015.     Activities of Daily Living In your present state of health, do you have any difficulty performing the following activities: 12/07/2014 12/06/2014  Hearing? N N  Vision? N N  Difficulty concentrating or making decisions? N N  Walking or climbing stairs? N Y  Dressing or bathing? N N  Doing errands, shopping? N -  Some recent data might be hidden    Patient Care Team: Corwin LevinsJames W John, MD as PCP - General Elvis CoilMartin Webb, MD (Nephrology) Runell GessJonathan J Berry, MD as Attending Physician (Cardiology)   Assessment:    Educated regarding fall prevention and lifeline Referred to GI for colonoscopy, as they have not scheduled.  May not be appropriate with dialysis    Exercise Activities and Dietary recommendations Recommended he go back to the fitness center as he enjoyed the exercise     Goals    . get a kidney (pt-stated)          Will discuss with doctor;      . medication adherence (pt-stated)          Will fup on mail order program to see if his meds will be more economical for his budget  Will try to stay on anti-clotting medication    . patient          Lose 10 lbs  Back to the Gym;  Portion control       Fall Risk Fall Risk  10/31/2015 09/13/2014 01/18/2014 11/09/2013  Falls in the past year? Yes Yes Yes No  Number falls in past yr: 1 2 or more 2 or more -  Injury with Fall? No No - -  Risk Factor Category  - - High Fall Risk -  Risk for fall due to : - Other (Comment) - -  Risk for fall due to (comments): - states legs give way; restricted to 40oz of water per day - -  Follow up - Education provided - -   Depression Screen PHQ 2/9 Scores 10/31/2015 09/13/2014 01/18/2014 11/09/2013  PHQ - 2 Score 0 0 0 0    Cognitive Testing MMSE - Mini Mental State Exam 09/13/2014  Not completed: Unable to complete   No functional losses   Immunization History  Administered Date(s) Administered  . Influenza,inj,Quad PF,36+ Mos 11/09/2013, 10/31/2015  . Influenza-Unspecified 01/16/2015  . Td 05/22/2008   Screening Tests Health Maintenance  Topic Date Due  . COLONOSCOPY  10/04/2015  . TETANUS/TDAP  05/23/2018  . INFLUENZA VACCINE  Completed  . Hepatitis C Screening  Completed  . HIV Screening  Completed      Plan:     Referred to GI for discussion of colonoscopy Given information on fall safety;   During the course of the visit the patient was educated and counseled about the following appropriate screening and preventive services:   Vaccines to include Pneumoccal, Influenza, Hepatitis B, Td, Zostavax, HCV  Electrocardiogram  Cardiovascular Disease  Colorectal cancer screening  Diabetes screening- neg  Prostate Cancer  Screening  Glaucoma screening  Nutrition counseling   Smoking cessation counseling  Patient Instructions (the written plan) was given to the patient.    Montine Circle, RN  11/08/2015  Medical screening examination/treatment/procedure(s) were performed by non-physician practitioner and as supervising physician I was immediately available for consultation/collaboration. I agree with above. Oliver Barre, MD

## 2015-11-09 DIAGNOSIS — Z23 Encounter for immunization: Secondary | ICD-10-CM | POA: Diagnosis not present

## 2015-11-09 DIAGNOSIS — D509 Iron deficiency anemia, unspecified: Secondary | ICD-10-CM | POA: Diagnosis not present

## 2015-11-09 DIAGNOSIS — I129 Hypertensive chronic kidney disease with stage 1 through stage 4 chronic kidney disease, or unspecified chronic kidney disease: Secondary | ICD-10-CM | POA: Diagnosis not present

## 2015-11-09 DIAGNOSIS — N2581 Secondary hyperparathyroidism of renal origin: Secondary | ICD-10-CM | POA: Diagnosis not present

## 2015-11-09 DIAGNOSIS — N186 End stage renal disease: Secondary | ICD-10-CM | POA: Diagnosis not present

## 2015-11-09 DIAGNOSIS — D631 Anemia in chronic kidney disease: Secondary | ICD-10-CM | POA: Diagnosis not present

## 2015-11-12 DIAGNOSIS — I129 Hypertensive chronic kidney disease with stage 1 through stage 4 chronic kidney disease, or unspecified chronic kidney disease: Secondary | ICD-10-CM | POA: Diagnosis not present

## 2015-11-12 DIAGNOSIS — D631 Anemia in chronic kidney disease: Secondary | ICD-10-CM | POA: Diagnosis not present

## 2015-11-12 DIAGNOSIS — D509 Iron deficiency anemia, unspecified: Secondary | ICD-10-CM | POA: Diagnosis not present

## 2015-11-12 DIAGNOSIS — N186 End stage renal disease: Secondary | ICD-10-CM | POA: Diagnosis not present

## 2015-11-12 DIAGNOSIS — Z23 Encounter for immunization: Secondary | ICD-10-CM | POA: Diagnosis not present

## 2015-11-12 DIAGNOSIS — N2581 Secondary hyperparathyroidism of renal origin: Secondary | ICD-10-CM | POA: Diagnosis not present

## 2015-11-14 DIAGNOSIS — N186 End stage renal disease: Secondary | ICD-10-CM | POA: Diagnosis not present

## 2015-11-14 DIAGNOSIS — Z23 Encounter for immunization: Secondary | ICD-10-CM | POA: Diagnosis not present

## 2015-11-14 DIAGNOSIS — D509 Iron deficiency anemia, unspecified: Secondary | ICD-10-CM | POA: Diagnosis not present

## 2015-11-14 DIAGNOSIS — I129 Hypertensive chronic kidney disease with stage 1 through stage 4 chronic kidney disease, or unspecified chronic kidney disease: Secondary | ICD-10-CM | POA: Diagnosis not present

## 2015-11-14 DIAGNOSIS — D631 Anemia in chronic kidney disease: Secondary | ICD-10-CM | POA: Diagnosis not present

## 2015-11-14 DIAGNOSIS — N2581 Secondary hyperparathyroidism of renal origin: Secondary | ICD-10-CM | POA: Diagnosis not present

## 2015-11-15 DIAGNOSIS — N186 End stage renal disease: Secondary | ICD-10-CM | POA: Diagnosis not present

## 2015-11-15 DIAGNOSIS — Z992 Dependence on renal dialysis: Secondary | ICD-10-CM | POA: Diagnosis not present

## 2015-11-15 DIAGNOSIS — I120 Hypertensive chronic kidney disease with stage 5 chronic kidney disease or end stage renal disease: Secondary | ICD-10-CM | POA: Diagnosis not present

## 2015-11-16 DIAGNOSIS — Z283 Underimmunization status: Secondary | ICD-10-CM | POA: Diagnosis not present

## 2015-11-16 DIAGNOSIS — N186 End stage renal disease: Secondary | ICD-10-CM | POA: Diagnosis not present

## 2015-11-16 DIAGNOSIS — N2581 Secondary hyperparathyroidism of renal origin: Secondary | ICD-10-CM | POA: Diagnosis not present

## 2015-11-16 DIAGNOSIS — D631 Anemia in chronic kidney disease: Secondary | ICD-10-CM | POA: Diagnosis not present

## 2015-11-16 DIAGNOSIS — D509 Iron deficiency anemia, unspecified: Secondary | ICD-10-CM | POA: Diagnosis not present

## 2015-11-19 DIAGNOSIS — N186 End stage renal disease: Secondary | ICD-10-CM | POA: Diagnosis not present

## 2015-11-19 DIAGNOSIS — N2581 Secondary hyperparathyroidism of renal origin: Secondary | ICD-10-CM | POA: Diagnosis not present

## 2015-11-19 DIAGNOSIS — D509 Iron deficiency anemia, unspecified: Secondary | ICD-10-CM | POA: Diagnosis not present

## 2015-11-19 DIAGNOSIS — Z283 Underimmunization status: Secondary | ICD-10-CM | POA: Diagnosis not present

## 2015-11-19 DIAGNOSIS — D631 Anemia in chronic kidney disease: Secondary | ICD-10-CM | POA: Diagnosis not present

## 2015-11-21 DIAGNOSIS — N2581 Secondary hyperparathyroidism of renal origin: Secondary | ICD-10-CM | POA: Diagnosis not present

## 2015-11-21 DIAGNOSIS — D509 Iron deficiency anemia, unspecified: Secondary | ICD-10-CM | POA: Diagnosis not present

## 2015-11-21 DIAGNOSIS — N186 End stage renal disease: Secondary | ICD-10-CM | POA: Diagnosis not present

## 2015-11-21 DIAGNOSIS — Z283 Underimmunization status: Secondary | ICD-10-CM | POA: Diagnosis not present

## 2015-11-21 DIAGNOSIS — D631 Anemia in chronic kidney disease: Secondary | ICD-10-CM | POA: Diagnosis not present

## 2015-11-22 ENCOUNTER — Ambulatory Visit: Payer: Medicare Other | Admitting: Neurology

## 2015-11-22 ENCOUNTER — Telehealth: Payer: Self-pay | Admitting: *Deleted

## 2015-11-22 NOTE — Telephone Encounter (Signed)
No showed follow up appointment. 

## 2015-11-23 ENCOUNTER — Encounter: Payer: Self-pay | Admitting: Neurology

## 2015-11-23 DIAGNOSIS — D631 Anemia in chronic kidney disease: Secondary | ICD-10-CM | POA: Diagnosis not present

## 2015-11-23 DIAGNOSIS — Z283 Underimmunization status: Secondary | ICD-10-CM | POA: Diagnosis not present

## 2015-11-23 DIAGNOSIS — D509 Iron deficiency anemia, unspecified: Secondary | ICD-10-CM | POA: Diagnosis not present

## 2015-11-23 DIAGNOSIS — N2581 Secondary hyperparathyroidism of renal origin: Secondary | ICD-10-CM | POA: Diagnosis not present

## 2015-11-23 DIAGNOSIS — N186 End stage renal disease: Secondary | ICD-10-CM | POA: Diagnosis not present

## 2015-11-26 DIAGNOSIS — N186 End stage renal disease: Secondary | ICD-10-CM | POA: Diagnosis not present

## 2015-11-26 DIAGNOSIS — Z283 Underimmunization status: Secondary | ICD-10-CM | POA: Diagnosis not present

## 2015-11-26 DIAGNOSIS — N2581 Secondary hyperparathyroidism of renal origin: Secondary | ICD-10-CM | POA: Diagnosis not present

## 2015-11-26 DIAGNOSIS — D509 Iron deficiency anemia, unspecified: Secondary | ICD-10-CM | POA: Diagnosis not present

## 2015-11-26 DIAGNOSIS — D631 Anemia in chronic kidney disease: Secondary | ICD-10-CM | POA: Diagnosis not present

## 2015-11-28 DIAGNOSIS — N186 End stage renal disease: Secondary | ICD-10-CM | POA: Diagnosis not present

## 2015-11-28 DIAGNOSIS — Z283 Underimmunization status: Secondary | ICD-10-CM | POA: Diagnosis not present

## 2015-11-28 DIAGNOSIS — D631 Anemia in chronic kidney disease: Secondary | ICD-10-CM | POA: Diagnosis not present

## 2015-11-28 DIAGNOSIS — N2581 Secondary hyperparathyroidism of renal origin: Secondary | ICD-10-CM | POA: Diagnosis not present

## 2015-11-28 DIAGNOSIS — D509 Iron deficiency anemia, unspecified: Secondary | ICD-10-CM | POA: Diagnosis not present

## 2015-11-30 DIAGNOSIS — N2581 Secondary hyperparathyroidism of renal origin: Secondary | ICD-10-CM | POA: Diagnosis not present

## 2015-11-30 DIAGNOSIS — Z283 Underimmunization status: Secondary | ICD-10-CM | POA: Diagnosis not present

## 2015-11-30 DIAGNOSIS — D509 Iron deficiency anemia, unspecified: Secondary | ICD-10-CM | POA: Diagnosis not present

## 2015-11-30 DIAGNOSIS — N186 End stage renal disease: Secondary | ICD-10-CM | POA: Diagnosis not present

## 2015-11-30 DIAGNOSIS — D631 Anemia in chronic kidney disease: Secondary | ICD-10-CM | POA: Diagnosis not present

## 2015-12-03 DIAGNOSIS — N186 End stage renal disease: Secondary | ICD-10-CM | POA: Diagnosis not present

## 2015-12-03 DIAGNOSIS — Z283 Underimmunization status: Secondary | ICD-10-CM | POA: Diagnosis not present

## 2015-12-03 DIAGNOSIS — D631 Anemia in chronic kidney disease: Secondary | ICD-10-CM | POA: Diagnosis not present

## 2015-12-03 DIAGNOSIS — D509 Iron deficiency anemia, unspecified: Secondary | ICD-10-CM | POA: Diagnosis not present

## 2015-12-03 DIAGNOSIS — N2581 Secondary hyperparathyroidism of renal origin: Secondary | ICD-10-CM | POA: Diagnosis not present

## 2015-12-05 DIAGNOSIS — N2581 Secondary hyperparathyroidism of renal origin: Secondary | ICD-10-CM | POA: Diagnosis not present

## 2015-12-05 DIAGNOSIS — D509 Iron deficiency anemia, unspecified: Secondary | ICD-10-CM | POA: Diagnosis not present

## 2015-12-05 DIAGNOSIS — Z283 Underimmunization status: Secondary | ICD-10-CM | POA: Diagnosis not present

## 2015-12-05 DIAGNOSIS — N186 End stage renal disease: Secondary | ICD-10-CM | POA: Diagnosis not present

## 2015-12-05 DIAGNOSIS — D631 Anemia in chronic kidney disease: Secondary | ICD-10-CM | POA: Diagnosis not present

## 2015-12-07 DIAGNOSIS — N186 End stage renal disease: Secondary | ICD-10-CM | POA: Diagnosis not present

## 2015-12-07 DIAGNOSIS — Z283 Underimmunization status: Secondary | ICD-10-CM | POA: Diagnosis not present

## 2015-12-07 DIAGNOSIS — D631 Anemia in chronic kidney disease: Secondary | ICD-10-CM | POA: Diagnosis not present

## 2015-12-07 DIAGNOSIS — D509 Iron deficiency anemia, unspecified: Secondary | ICD-10-CM | POA: Diagnosis not present

## 2015-12-07 DIAGNOSIS — N2581 Secondary hyperparathyroidism of renal origin: Secondary | ICD-10-CM | POA: Diagnosis not present

## 2015-12-10 DIAGNOSIS — Z283 Underimmunization status: Secondary | ICD-10-CM | POA: Diagnosis not present

## 2015-12-10 DIAGNOSIS — N186 End stage renal disease: Secondary | ICD-10-CM | POA: Diagnosis not present

## 2015-12-10 DIAGNOSIS — N2581 Secondary hyperparathyroidism of renal origin: Secondary | ICD-10-CM | POA: Diagnosis not present

## 2015-12-10 DIAGNOSIS — D509 Iron deficiency anemia, unspecified: Secondary | ICD-10-CM | POA: Diagnosis not present

## 2015-12-10 DIAGNOSIS — D631 Anemia in chronic kidney disease: Secondary | ICD-10-CM | POA: Diagnosis not present

## 2015-12-11 ENCOUNTER — Ambulatory Visit (INDEPENDENT_AMBULATORY_CARE_PROVIDER_SITE_OTHER): Payer: Medicare Other | Admitting: Neurology

## 2015-12-11 ENCOUNTER — Encounter: Payer: Self-pay | Admitting: Neurology

## 2015-12-11 VITALS — BP 147/99 | HR 79 | Ht 72.0 in | Wt 213.0 lb

## 2015-12-11 DIAGNOSIS — G3184 Mild cognitive impairment, so stated: Secondary | ICD-10-CM | POA: Diagnosis not present

## 2015-12-11 DIAGNOSIS — I63522 Cerebral infarction due to unspecified occlusion or stenosis of left anterior cerebral artery: Secondary | ICD-10-CM | POA: Diagnosis not present

## 2015-12-11 DIAGNOSIS — I208 Other forms of angina pectoris: Secondary | ICD-10-CM | POA: Diagnosis not present

## 2015-12-11 DIAGNOSIS — I739 Peripheral vascular disease, unspecified: Secondary | ICD-10-CM | POA: Diagnosis not present

## 2015-12-11 DIAGNOSIS — R2689 Other abnormalities of gait and mobility: Secondary | ICD-10-CM | POA: Diagnosis not present

## 2015-12-11 DIAGNOSIS — I679 Cerebrovascular disease, unspecified: Secondary | ICD-10-CM | POA: Diagnosis not present

## 2015-12-11 NOTE — Progress Notes (Signed)
PATIENT: [name] DOB: October 03, 1960  Chief Complaint  Patient presents with  . Cerebrovascular Accident    MMSE 30/30 - 12 animals.  Last seen in 01/2016.  He is here with his sister, [name].  He has been under the care of his PCP, Dr. Melvyn Novas.  He was referred back here to discuss his worsening memory and forgetfulness.     HISTORICAL  [name] is a 55 years old right-handed male, accompanied by his sister [name], seen in refer by his primary care physician Dr. Oliver Barre for evaluation of memory loss on December 11 2015,  I have seen him previously in November 2015  He had past medical history of poorly controlled diabetes, end-stage renal disease, started dialysis in August 2015, he received dialysis in Tuesday, Thursday, Saturday, through the left upper extremity AV fistula.  He had a history of  Stroke, MRI of the brain in November 2013 showed chronic ischemic changes in periventricular white matters, chronic hemorrhagic infarction in the left putamen, anterior limb of internal capsule, chronic hemorrhagic infarction of right temporoparietal lobe. Chronic hemorrhagic infarction in the left paracentral pontine has progressed, basal ganglion, thalamus, chronic hemorrhagic infarction in the right frontal lobe.  Echocardiogram in 2013 showed mild concentric left ventricular hypertrophy, ejection fraction was 35%, moderate to severe septal hypokinesia, moderate apical wall hypokinesis.  He was admitted to the hospital in October 2015 for acute onset of left-sided weakness,,MRI of the brain showed Acute infarct head of the caudate on the left with multiple lacunar stroke at BG and pons. Infarct felt secondary to severer atherosclerotic disease aggravated by hypotension. His weakness on the left did not correlate with the acute stroke, but more consistent with the recrudescence of his old lacunar stroke. New acute stroke likely silent stroke caused by hypotension. MRA Severe  intracranial atherosclerotic disease, especially b/l MCAs. Carotid Doppler Preliminary report: Bilateral: 1-39% ICA stenosis. Vertebral artery flow is antegrade. 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  Patient tends to minimize his difficulty, sister reported that patient has become forgetful, tends to repeat himself, overspend, forgot to pay his bill, his light water has been cut off few times, his sister want his cognitive function to be fully evaluated, to facilitate the process of power of attorney assignment, his sister is now managing his finances, he also is much less motivated, does not cook, cleaning, no longer keep up his exercise routine, easily agitated, is depressed  I reviewed the laboratory evaluation in August 2017, LDL was elevated 166, cholesterol was 274, triglycerides right was 378, A1c was 4.9, PSA was elevated 5.82, normal TSH 1.15, normal CBC, with hemoglobin of 11 point 9, normal liver functional tests, BMP showed mildly decreased potassium 3.4, elevated creatinine 5.57, glucose was 104, RPR was negative  We have personally reviewed his most recent MRI of the brain in 2015, acute infarction in the left head of caudate, moderate supratentorium small vessel disease, severe intracranial atherosclerotic disease  Laboratory evaluation in August 2017, elevated LDL 166, elevated total cholesterol 274, triglycerides 378, A1c 4.9, negative hepatitis C antibody, normal TSH  1.15  REVIEW OF SYSTEMS: Full 14 system review of systems performed and notable only for memory loss, weakness, slurred speech,  snoring, too much sleep, feeling cold, runny nose, constipation, urination problems, weight loss, fatigue, rash, birthmark  ALLERGIES: Allergies  Allergen Reactions  . Shrimp [Shellfish Allergy] Shortness Of Breath  . Insulins Other (See Comments)    Patient unsure as to the kind of insulin but states that it has previously caused seizures. This occurred in the hospital in 2006; but in most recent hospitalization (Jan 2013) received insulin but did not have reaction  . Atorvastatin Other (See Comments)    weakness  . Simvastatin Other (See Comments)     abnormal liver tests  . Ultram [Tramadol] Other (See Comments)    Liver enzyme     HOME MEDICATIONS: Current Outpatient Prescriptions  Medication Sig Dispense Refill  . acetaminophen (TYLENOL) 500 MG tablet Take 1,000 mg by mouth every 6 (six) hours as needed (pain).    Marland Kitchen. amLODipine (NORVASC) 5 MG tablet Take 1 tablet (5 mg total) by mouth daily. 90 tablet 3  . aspirin EC 81 MG EC tablet Take 1 tablet (81 mg total) by mouth daily.    Marland Kitchen. atorvastatin (LIPITOR) 20 MG tablet Take 1 tablet (20 mg total) by mouth daily. 30 tablet 1  . cinacalcet (SENSIPAR) 30 MG tablet Take 60 mg by mouth at bedtime. Reported on 07/05/2015    . clopidogrel (PLAVIX) 75 MG tablet Take 1 tablet (75 mg total) by mouth daily. 90 tablet 1  . doxycycline (VIBRA-TABS) 100 MG tablet Take 1 tablet (100 mg total) by mouth 2 (two) times daily. 14 tablet 0  . febuxostat (ULORIC) 40 MG tablet Take 40 mg by mouth daily.     . isosorbide mononitrate (IMDUR) 60 MG 24 hr tablet Take 1/2 tablet twice daily 90 tablet 3  . lanthanum ([name]) 1000 MG chewable tablet Chew 2 tablets (2,000 mg total) by mouth 3 (three) times daily with meals. Yearly physical due in July must see MD for refills 540 tablet 0  . lidocaine-prilocaine (EMLA) cream Apply 1 application topically as needed. Apply as needed for dialysis site    . metoprolol (LOPRESSOR) 50 MG tablet Take 1 tablet (50 mg total)  by mouth 2 (two) times daily. 180 tablet 3  . NITROSTAT 0.4 MG SL tablet PLACE 1 TABLET UNDER TONGUE AS NEEDED FOR CHEST PAIN EVERY 5 MINUTES (MAX 3 DOSES) 25 tablet 4  . terazosin (HYTRIN) 10 MG capsule TAKE 1 CAPSULE BY MOUTH DAILY AT BEDTIME 90 capsule 3  . ticagrelor (BRILINTA) 90 MG TABS tablet Take 90 mg by mouth 2 (two) times daily.    Marland Kitchen. zolpidem (AMBIEN) 10 MG tablet Take 1 tablet (10 mg total) by mouth at bedtime as needed for sleep. Please make return office visit for further refills 90 tablet 1   No current facility-administered medications for this visit.     PAST MEDICAL HISTORY: Past Medical History:  Diagnosis Date  . ACHALASIA   . ANEMIA-NOS   . CAD (coronary artery disease), 3 vessel significant disease.    . CEREBROVASCULAR ACCIDENT, HX OF   . CKD (chronic kidney disease) stage 4, GFR 15-29 ml/min (HCC)   . Constipation   . DEGENERATIVE JOINT DISEASE, SHOULDER    knee  . DEPRESSION    2006- not depressed any longer  . Family history of adverse reaction to anesthesia    Mother, hard to awaken  . GERD  no meds required  . Gout    takes Uloric daily  . H/O hiatal hernia   . Headache(784.0)   . Heart attack (HCC)   . History of colon polyps   . History of diastolic dysfunction, grade 2 by echo   . HYPERLIPIDEMIA    takes crestor daily  . Hyperlipidemia   . HYPERTENSION    takes Norvasc,Catapress,Hydralazine,Imdur,and Metoprolol daily  . Impaired glucose tolerance   . Insomnia    takes Ambien nightly  . NSTEMI (non-ST elevated myocardial infarction), 06/30/11 07/01/2011  . RENAL CALCULUS, HX OF   . RENAL INSUFFICIENCY    12/2013- TThSat  . S/P coronary artery stent placement, PTCA/DES Resolute to mid-LAD via the LIMA graft, and PTCA of apical 95% stenosis 07/05/11 07/04/2011  . S/P PTCA (percutaneous transluminal coronary angioplasty), 06/30/11 07/01/2011  . Seizures (HCC)    INSULIN INDUCED  . Sleep apnea    SLEEP STUDY IN [place] , NO MACHINE YET  1 YEAR    . Stroke (HCC)    93/2005/2006/2007;left sided weakness.  1993 12/2013    PAST SURGICAL HISTORY: Past Surgical History:  Procedure Laterality Date  . ARCH AORTOGRAM N/A 02/19/2012   Procedure: ARCH AORTOGRAM;  Surgeon: Fransisco Hertz, MD;  Location: [facility] CATH LAB;  Service: Cardiovascular;  Laterality: N/A;  . AV FISTULA PLACEMENT  12/02/2011   Procedure: ARTERIOVENOUS (AV) FISTULA CREATION;  Surgeon: Fransisco Hertz, MD;  Location: [facility] OR;  Service: Vascular;  Laterality: Left;  Ultrasound Guided  . AV FISTULA PLACEMENT Left 06/08/2012   Procedure: ARTERIOVENOUS (AV) FISTULA CREATION;  Surgeon: Fransisco Hertz, MD;  Location: [facility] OR;  Service: Vascular;  Laterality: Left;  . BASCILIC VEIN TRANSPOSITION Left 09/06/2014   Procedure: FIRST STAGE BASILIC VEIN TRANSPOSITION ;  Surgeon: Fransisco Hertz, MD;  Location[facility] OR;  Service: Vascular;  Laterality: Left;  . BASCILIC VEIN TRANSPOSITION Left 12/06/2014   Procedure: SECOND STAGE BRACHIAL VEIN TRANSPOSITION ;  Surgeon: Fransisco Hertz, MD;  Location: [facility] OR;  Service: Vascular;  Laterality: Left;  . CARDIAC CATHETERIZATION    . COLONOSCOPY    . CORONARY ANGIOPLASTY     06/30/11   AT [facility]  . CORONARY ARTERY BYPASS GRAFT  04/01/2011   Procedure: CORONARY ARTERY BYPASS GRAFTING (CABG);  Surgeon: Kathlee Nations Suann Larry, MD;  Location: [facility] OR;  Service: Open Heart Surgery;  Laterality: N/A;  . HERNIA REPAIR  2013  . INSERTION OF DIALYSIS CATHETER  04/01/2011   Procedure: INSERTION OF DIALYSIS CATHETER;  Surgeon: Nilda Simmer, MD;  Location: [facility] OR;  Service: Vascular;  Laterality: N/A;  . KNEE SURGERY Right 01/07/2011   arthroscopy  . LEFT HEART CATHETERIZATION WITH CORONARY ANGIOGRAM N/A 03/28/2011   Procedure: LEFT HEART CATHETERIZATION WITH CORONARY ANGIOGRAM;  Surgeon: Lennette Bihari, MD;  Location: [facility] CATH LAB;  Service: Cardiovascular;  Laterality: N/A;  pending creatnine  . LEFT HEART CATHETERIZATION WITH CORONARY/GRAFT ANGIOGRAM N/A 06/30/2011   Procedure:  LEFT HEART CATHETERIZATION WITH Isabel Caprice;  Surgeon: Thurmon Fair, MD;  Location: [facility] CATH LAB;  Service: Cardiovascular;  Laterality: N/A;  . LIGATION OF ARTERIOVENOUS  FISTULA Left 06/08/2012   Procedure: LIGATION OF ARTERIOVENOUS  FISTULA;  Surgeon: Fransisco Hertz, MD;  Location: [facility] OR;  Service: Vascular;  Laterality: Left;  . NM MYOCAR PERF EJECTION FRACTION  06/25/2011   There is evidence of mild ischemia in the basal inferolateral and mid inferolateral regions. (Extent 7 %) The post-stress ejection fraction is 44%. There  is mild hypocontractility in the distal inferoapical segment. baseline T wave inversion is noted in leads I, avL, II, V2. This is a low risk scan. The present perfusion study suggests significant myocardial salvage with mild residual ischemia .  Marland Kitchen PERCUTANEOUS CORONARY INTERVENTION-BALLOON ONLY  06/30/2011   Procedure: PERCUTANEOUS CORONARY INTERVENTION-BALLOON ONLY;  Surgeon: Thurmon Fair, MD;  Location: [facility] CATH LAB;  Service: Cardiovascular;;  . PERCUTANEOUS CORONARY STENT INTERVENTION (PCI-S) N/A 07/04/2011   Procedure: PERCUTANEOUS CORONARY STENT INTERVENTION (PCI-S);  Surgeon: Lennette Bihari, MD;  Location: [facility] CATH LAB;  Service: Cardiovascular;  Laterality: N/A;  . REVISION OF ARTERIOVENOUS GORETEX GRAFT Left 12/06/2014   Procedure: REVISION OF ARTERIOVENOUS GORETEX GRAFT USING 64mmx10cm;  Surgeon: Fransisco Hertz, MD;  Location: [facility] OR;  Service: Vascular;  Laterality: Left;  . STOMACH SURGERY  2009   achalasia  . SUBCLAVIAN STENT PLACEMENT  12/20/2011  . TONSILLECTOMY      FAMILY HISTORY: Family History  Problem Relation Age of Onset  . Cancer Mother   . Heart disease Mother   . Hyperlipidemia Mother   . Hypertension Mother   . Alzheimer's disease Mother   . Cancer Father   . Heart disease Father   . Hypertension Other   . Stroke Other   . Hyperlipidemia Other     SOCIAL HISTORY:  Social History   Social History  . Marital status: Divorced     Spouse name: N/A  . Number of children: 1  . Years of education: Bachelors   Occupational History  . Disabled    Social History Main Topics  . Smoking status: Never Smoker  . Smokeless tobacco: Former Neurosurgeon    Types: Chew    Quit date: 03/18/2003     Comment: chewed tobacco   . Alcohol use 1.2 oz/week    2 Shots of liquor per week     Comment: rarely; beer  . Drug use: No  . Sexual activity: Not Currently    Partners: Male   Other Topics Concern  . Not on file   Social History Narrative   Lives at home alone.   Right-handed.   Occasional caffeine use.     PHYSICAL EXAM   Vitals:   12/11/15 0903  BP: (!) 147/99  Pulse: 79  Weight: 213 lb (96.6 kg)  Height: 6' (1.829 m)    Not recorded      Body mass index is 28.89 kg/m.  PHYSICAL EXAMNIATION:  Gen: NAD, conversant, well nourised, obese, well groomed                     Cardiovascular: Regular rate rhythm, no peripheral edema, warm, nontender. Eyes: Conjunctivae clear without exudates or hemorrhage Neck: Supple, no carotid bruise. Pulmonary: Clear to auscultation bilaterally   NEUROLOGICAL EXAM:  MENTAL STATUS: Speech:    Speech is normal; fluent and spontaneous with normal comprehension.  Cognition:Mini-Mental Status Examination 30/30, animal naming 12     Orientation to time, place and person     Normal recent and remote memory     Normal Attention span and concentration     Normal Language, naming, repeating,spontaneous speech     Fund of knowledge   CRANIAL NERVES: CN II: Visual fields are full to confrontation. Fundoscopic exam is normal with sharp discs and no vascular changes. Pupils are round equal and briskly reactive to light. CN III, IV, VI: extraocular movement are normal. No ptosis. CN V: Facial sensation is intact to pinprick in all  3 divisions bilaterally. Corneal responses are intact.  CN VII: Face is symmetric with normal eye closure and smile. CN VIII: Hearing is normal to rubbing  fingers CN IX, X: Palate elevates symmetrically. Phonation is normal. CN XI: Head turning and shoulder shrug are intact CN XII: Tongue is midline with normal movements and no atrophy.  MOTOR: There is no pronator drift of out-stretched arms. Muscle bulk and tone are normal. Muscle strength is normal.  REFLEXES: Reflexes are 2+ and symmetric at the biceps, triceps, knees, and ankles. Plantar responses are flexor.  SENSORY: Intact to light touch, pinprick, positional sensation and vibratory sensation are intact in fingers and toes.  COORDINATION: Rapid alternating movements and fine finger movements are intact. There is no dysmetria on finger-to-nose and heel-knee-shin.    GAIT/STANCE: Posture is normal. Gait is steady with normal steps, base, arm swing, and turning. Heel and toe walking are normal. Tandem gait is normal.  Romberg is absent.   DIAGNOSTIC DATA (LABS, IMAGING, TESTING) - I reviewed patient records, labs, notes, testing and imaging myself where available.   ASSESSMENT AND PLAN  [name] is a 55 y.o. male   Mild cognitive impairment Multifactorial, stroke, depression all contribute EEG MRI brain Vit B12 level Complicated past medical history, including stroke, and end-stage renal disease on hemodialysis   Levert Feinstein, M.D. Ph.D.  [facility] [address], [address] [place], [place] [address] Ph: [phone] Fax: [phone]  CC: Corwin Levins, MD

## 2015-12-12 DIAGNOSIS — D509 Iron deficiency anemia, unspecified: Secondary | ICD-10-CM | POA: Diagnosis not present

## 2015-12-12 DIAGNOSIS — Z283 Underimmunization status: Secondary | ICD-10-CM | POA: Diagnosis not present

## 2015-12-12 DIAGNOSIS — N186 End stage renal disease: Secondary | ICD-10-CM | POA: Diagnosis not present

## 2015-12-12 DIAGNOSIS — D631 Anemia in chronic kidney disease: Secondary | ICD-10-CM | POA: Diagnosis not present

## 2015-12-12 DIAGNOSIS — N2581 Secondary hyperparathyroidism of renal origin: Secondary | ICD-10-CM | POA: Diagnosis not present

## 2015-12-12 LAB — VITAMIN B12: Vitamin B-12: 653 pg/mL (ref 211–946)

## 2015-12-13 ENCOUNTER — Ambulatory Visit (INDEPENDENT_AMBULATORY_CARE_PROVIDER_SITE_OTHER): Payer: Medicare Other | Admitting: Neurology

## 2015-12-13 DIAGNOSIS — I679 Cerebrovascular disease, unspecified: Secondary | ICD-10-CM

## 2015-12-13 DIAGNOSIS — G3184 Mild cognitive impairment, so stated: Secondary | ICD-10-CM

## 2015-12-13 DIAGNOSIS — R410 Disorientation, unspecified: Secondary | ICD-10-CM

## 2015-12-13 DIAGNOSIS — Z992 Dependence on renal dialysis: Secondary | ICD-10-CM | POA: Diagnosis not present

## 2015-12-13 DIAGNOSIS — T82858D Stenosis of vascular prosthetic devices, implants and grafts, subsequent encounter: Secondary | ICD-10-CM | POA: Diagnosis not present

## 2015-12-13 DIAGNOSIS — I871 Compression of vein: Secondary | ICD-10-CM | POA: Diagnosis not present

## 2015-12-13 DIAGNOSIS — I63522 Cerebral infarction due to unspecified occlusion or stenosis of left anterior cerebral artery: Secondary | ICD-10-CM

## 2015-12-13 DIAGNOSIS — N186 End stage renal disease: Secondary | ICD-10-CM | POA: Diagnosis not present

## 2015-12-13 DIAGNOSIS — I739 Peripheral vascular disease, unspecified: Secondary | ICD-10-CM

## 2015-12-14 DIAGNOSIS — N186 End stage renal disease: Secondary | ICD-10-CM | POA: Diagnosis not present

## 2015-12-14 DIAGNOSIS — N2581 Secondary hyperparathyroidism of renal origin: Secondary | ICD-10-CM | POA: Diagnosis not present

## 2015-12-14 DIAGNOSIS — Z283 Underimmunization status: Secondary | ICD-10-CM | POA: Diagnosis not present

## 2015-12-14 DIAGNOSIS — D509 Iron deficiency anemia, unspecified: Secondary | ICD-10-CM | POA: Diagnosis not present

## 2015-12-14 DIAGNOSIS — D631 Anemia in chronic kidney disease: Secondary | ICD-10-CM | POA: Diagnosis not present

## 2015-12-15 DIAGNOSIS — Z992 Dependence on renal dialysis: Secondary | ICD-10-CM | POA: Diagnosis not present

## 2015-12-15 DIAGNOSIS — I120 Hypertensive chronic kidney disease with stage 5 chronic kidney disease or end stage renal disease: Secondary | ICD-10-CM | POA: Diagnosis not present

## 2015-12-15 DIAGNOSIS — N186 End stage renal disease: Secondary | ICD-10-CM | POA: Diagnosis not present

## 2015-12-17 DIAGNOSIS — N2581 Secondary hyperparathyroidism of renal origin: Secondary | ICD-10-CM | POA: Diagnosis not present

## 2015-12-17 DIAGNOSIS — N186 End stage renal disease: Secondary | ICD-10-CM | POA: Diagnosis not present

## 2015-12-19 DIAGNOSIS — N186 End stage renal disease: Secondary | ICD-10-CM | POA: Diagnosis not present

## 2015-12-19 DIAGNOSIS — N2581 Secondary hyperparathyroidism of renal origin: Secondary | ICD-10-CM | POA: Diagnosis not present

## 2015-12-19 NOTE — Procedures (Signed)
   HISTORY: 55 years old male, with history of progressive memory loss  TECHNIQUE:  16 channel EEG was performed based on standard 10-16 international system. One channel was dedicated to EKG, which has demonstrates normal sinus rhythm of 84 beats per minutes.  Upon awakening, the posterior background activity was well-developed, in alpha range, 9 Hz, reactive to eye opening and closure.  There was no evidence of epileptiform discharge.  Photic stimulation was performed, which induced a symmetric photic driving.  Hyperventilation was not performed.  Stage II sleep was achieved.  CONCLUSION: This is a  normal awake and asleep EEG.  There is no electrodiagnostic evidence of epileptiform discharge.  Levert FeinsteinYijun Krissi Willaims, M.D. Ph.D.  [facility] [address] [place], [place] [address]  Phone: [phone] Fax:      [phone]

## 2015-12-21 DIAGNOSIS — N2581 Secondary hyperparathyroidism of renal origin: Secondary | ICD-10-CM | POA: Diagnosis not present

## 2015-12-21 DIAGNOSIS — N186 End stage renal disease: Secondary | ICD-10-CM | POA: Diagnosis not present

## 2015-12-24 DIAGNOSIS — N186 End stage renal disease: Secondary | ICD-10-CM | POA: Diagnosis not present

## 2015-12-24 DIAGNOSIS — N2581 Secondary hyperparathyroidism of renal origin: Secondary | ICD-10-CM | POA: Diagnosis not present

## 2015-12-26 DIAGNOSIS — N186 End stage renal disease: Secondary | ICD-10-CM | POA: Diagnosis not present

## 2015-12-26 DIAGNOSIS — N2581 Secondary hyperparathyroidism of renal origin: Secondary | ICD-10-CM | POA: Diagnosis not present

## 2015-12-28 DIAGNOSIS — N2581 Secondary hyperparathyroidism of renal origin: Secondary | ICD-10-CM | POA: Diagnosis not present

## 2015-12-28 DIAGNOSIS — N186 End stage renal disease: Secondary | ICD-10-CM | POA: Diagnosis not present

## 2015-12-31 DIAGNOSIS — N2581 Secondary hyperparathyroidism of renal origin: Secondary | ICD-10-CM | POA: Diagnosis not present

## 2015-12-31 DIAGNOSIS — N186 End stage renal disease: Secondary | ICD-10-CM | POA: Diagnosis not present

## 2016-01-02 DIAGNOSIS — N2581 Secondary hyperparathyroidism of renal origin: Secondary | ICD-10-CM | POA: Diagnosis not present

## 2016-01-02 DIAGNOSIS — N186 End stage renal disease: Secondary | ICD-10-CM | POA: Diagnosis not present

## 2016-01-03 ENCOUNTER — Other Ambulatory Visit: Payer: Medicare Other

## 2016-01-04 DIAGNOSIS — N186 End stage renal disease: Secondary | ICD-10-CM | POA: Diagnosis not present

## 2016-01-04 DIAGNOSIS — N2581 Secondary hyperparathyroidism of renal origin: Secondary | ICD-10-CM | POA: Diagnosis not present

## 2016-01-07 DIAGNOSIS — N186 End stage renal disease: Secondary | ICD-10-CM | POA: Diagnosis not present

## 2016-01-07 DIAGNOSIS — N2581 Secondary hyperparathyroidism of renal origin: Secondary | ICD-10-CM | POA: Diagnosis not present

## 2016-01-08 ENCOUNTER — Ambulatory Visit (INDEPENDENT_AMBULATORY_CARE_PROVIDER_SITE_OTHER): Payer: Medicare Other | Admitting: Pulmonary Disease

## 2016-01-08 ENCOUNTER — Encounter: Payer: Self-pay | Admitting: Pulmonary Disease

## 2016-01-08 VITALS — BP 124/74 | HR 72 | Ht 72.0 in | Wt 221.6 lb

## 2016-01-08 DIAGNOSIS — G4733 Obstructive sleep apnea (adult) (pediatric): Secondary | ICD-10-CM | POA: Diagnosis not present

## 2016-01-08 NOTE — Patient Instructions (Signed)
It was a pleasure taking care of you today!  We will schedule you to have a sleep study to determine if you have sleep apnea.    We will get a lab sleep study.  You will be scheduled to have a lab sleep study in 4-6 weeks.  Someone from the sleep lab will call you in 2-3 days to schedule the study with you.  They usually have cancellations every night so most likely, they will have openings for a lab sleep study next week or so.  We encourage you to do your sleep study then if possible. Please give us a call in a week is no one from the sleep lab calls you in 2-3 days.   If the sleep study is positive, we will order you a CPAP  machine.  Please call the office if you do NOT receive your machine in the next 1-2 weeks.   Please make sure you use your CPAP device everytime you sleep.  We will monitor the usage of your machine per your insurance requirement.  Your insurance company may take the machine from you if you are not using it regularly.   Please clean the mask, tubings, filter, water reservoir with soapy water every week.  Please use distilled water for the water reservoir.   Please call the office or your machine provider (DME company) if you are having issues with the device.    Return to clinic in 6-8 weeks with Dr. De Dios or NP   

## 2016-01-08 NOTE — Progress Notes (Signed)
Subjective:    Patient ID: [name], male    DOB: June 10, 1960, 55 y.o.   MRN: [id number]  HPI   This is the case of TOA MIA, 55 y.o. Male, who was referred by Wendie Simmer  in consultation regarding regarding possible OSA.    As you very well know, patient is a non smoker, not known to have asthma or copd.   Patient had Colonoscopy 10 yrs ago and had o2 desatn and apneas.  Ended up having sleep study.  PSG was (+). Did not get a chance to be on cpap. Sx persisted.   Patient has snoring, has witnessed apneas, has gasping/choking. Sleep 6 hrs/night. Wakes up frequently during the night. Has unrefreshed sleep. Hypersomnia affects his fxnality. Naps daily. Has unrefreshed sleep despite napping. ESS 12.   (-) abnormal behavior in sleep.   Has had 6 CVAs.   Patient has ESRD, gets HD MWF.      Review of Systems  Constitutional: Negative.  Negative for fever and unexpected weight change.  HENT: Negative.  Negative for congestion, dental problem, ear pain, nosebleeds, postnasal drip, rhinorrhea, sinus pressure, sneezing, sore throat and trouble swallowing.   Eyes: Negative.  Negative for redness and itching.  Respiratory: Positive for cough. Negative for chest tightness, shortness of breath and wheezing.   Cardiovascular: Negative.  Negative for palpitations and leg swelling.  Gastrointestinal: Negative for nausea and vomiting.  Endocrine: Negative.   Genitourinary: Negative.  Negative for dysuria.  Musculoskeletal: Negative.  Negative for joint swelling.  Skin: Negative.  Negative for rash.  Allergic/Immunologic: Negative.   Neurological: Negative.  Negative for headaches.  Hematological: Bruises/bleeds easily.  Psychiatric/Behavioral: Negative.  Negative for dysphoric mood. The patient is not nervous/anxious.    Past Medical History:  Diagnosis Date  . ACHALASIA   . ANEMIA-NOS   . CAD (coronary artery disease), 3 vessel significant disease.    . CEREBROVASCULAR  ACCIDENT, HX OF   . CKD (chronic kidney disease) stage 4, GFR 15-29 ml/min (HCC)   . Constipation   . DEGENERATIVE JOINT DISEASE, SHOULDER    knee  . DEPRESSION    2006- not depressed any longer  . Family history of adverse reaction to anesthesia    Mother, hard to awaken  . GERD    no meds required  . Gout    takes Uloric daily  . H/O hiatal hernia   . Headache(784.0)   . Heart attack   . History of colon polyps   . History of diastolic dysfunction, grade 2 by echo   . HYPERLIPIDEMIA    takes crestor daily  . Hyperlipidemia   . HYPERTENSION    takes Norvasc,Catapress,Hydralazine,Imdur,and Metoprolol daily  . Impaired glucose tolerance   . Insomnia    takes Ambien nightly  . NSTEMI (non-ST elevated myocardial infarction), 06/30/11 07/01/2011  . RENAL CALCULUS, HX OF   . RENAL INSUFFICIENCY    12/2013- TThSat  . S/P coronary artery stent placement, PTCA/DES Resolute to mid-LAD via the LIMA graft, and PTCA of apical 95% stenosis 07/05/11 07/04/2011  . S/P PTCA (percutaneous transluminal coronary angioplasty), 06/30/11 07/01/2011  . Seizures (HCC)    INSULIN INDUCED  . Sleep apnea    SLEEP STUDY IN [place] , NO MACHINE YET  1 YEAR  . Stroke (HCC)    93/2005/2006/2007;left sided weakness.  1993 12/2013   (-) CA, DVT  Family History  Problem Relation Age of Onset  . Cancer Mother   . Heart disease  Mother   . Hyperlipidemia Mother   . Hypertension Mother   . Alzheimer's disease Mother   . Cancer Father   . Heart disease Father   . Hypertension Other   . Stroke Other   . Hyperlipidemia Other      Past Surgical History:  Procedure Laterality Date  . ARCH AORTOGRAM N/A 02/19/2012   Procedure: ARCH AORTOGRAM;  Surgeon: Fransisco HertzBrian L Chen, MD;  [facility];  Service: Cardiovascular;  Laterality: N/A;  . AV FISTULA PLACEMENT  12/02/2011   Procedure: ARTERIOVENOUS (AV) FISTULA CREATION;  Surgeon: Fransisco HertzBrian L Chen, MD;  [facility];  Service: Vascular;  Laterality: Left;   Ultrasound Guided  . AV FISTULA PLACEMENT Left 06/08/2012   Procedure: ARTERIOVENOUS (AV) FISTULA CREATION;  Surgeon: Fransisco HertzBrian L Chen, MD;  [facility];  Service: Vascular;  Laterality: Left;  . BASCILIC VEIN TRANSPOSITION Left 09/06/2014   Procedure: FIRST STAGE BASILIC VEIN TRANSPOSITION ;  Surgeon: Fransisco HertzBrian L Chen, MD;  [facility];  Service: Vascular;  Laterality: Left;  . BASCILIC VEIN TRANSPOSITION Left 12/06/2014   Procedure: SECOND STAGE BRACHIAL VEIN TRANSPOSITION ;  Surgeon: Fransisco HertzBrian L Chen, MD;  [facility];  Service: Vascular;  Laterality: Left;  . CARDIAC CATHETERIZATION    . COLONOSCOPY    . CORONARY [facility]     [facility]11   [facility]  . CORONARY ARTERY BYPASS GRAFT  04/01/2011   Procedure: CORONARY ARTERY BYPASS GRAFTING (CABG);  Surgeon: Kathlee NationsPeter Van Suann Larryrigt III, MD;  [facility];  Service: Open Heart Surgery;  Laterality: N/A;  . HERNIA REPAIR  2013  . INSERTION OF DIALYSIS CATHETER  04/01/2011   Procedure: INSERTION OF DIALYSIS CATHETER;  Surgeon: Nilda SimmerBrian Liang-Yu Chen, MD;  [facility];  Service: Vascular;  Laterality: N/A;  . KNEE SURGERY Right 01/07/2011   arthroscopy  . LEFT HEART CATHETERIZATION WITH CORONARY ANGIOGRAM N/A 03/28/2011   Procedure: LEFT HEART CATHETERIZATION WITH CORONARY ANGIOGRAM;  Surgeon: Lennette Biharihomas A Kelly, MD;  [facility];  Service: Cardiovascular;  Laterality: N/A;  pending creatnine  . LEFT HEART CATHETERIZATION WITH CORONARY/GRAFT ANGIOGRAM N/A 06/30/2011   Procedure: LEFT HEART CATHETERIZATION WITH Isabel CapriceORONARY/GRAFT ANGIOGRAM;  Surgeon: Thurmon FairMihai Croitoru, MD;  [facility];  Service: Cardiovascular;  Laterality: N/A;  . LIGATION OF ARTERIOVENOUS  FISTULA Left 06/08/2012   Procedure: LIGATION OF ARTERIOVENOUS  FISTULA;  Surgeon: Fransisco HertzBrian L Chen, MD;  [facility];  Service: Vascular;  Laterality: Left;  . NM MYOCAR PERF EJECTION FRACTION  06/25/2011   There is evidence of mild ischemia in the basal inferolateral and mid inferolateral regions.  (Extent 7 %) The post-stress ejection fraction is 44%. There is mild hypocontractility in the distal inferoapical segment. baseline T wave inversion is noted in leads I, avL, II, V2. This is a low risk scan. The present perfusion study suggests significant myocardial salvage with mild residual ischemia .  Marland Kitchen. PERCUTANEOUS CORONARY INTERVENTION-BALLOON ONLY  06/30/2011   Procedure: PERCUTANEOUS CORONARY INTERVENTION-BALLOON ONLY;  Surgeon: Thurmon FairMihai Croitoru, MD;  [facility]: [facility];  Service: Cardiovascular;;  . PERCUTANEOUS CORONARY STENT INTERVENTION (PCI-S) N/A 07/04/2011   Procedure: PERCUTANEOUS CORONARY STENT INTERVENTION (PCI-S);  Surgeon: Lennette Biharihomas A Kelly, MD;  [facility];  Service: Cardiovascular;  Laterality: N/A;  . REVISION OF ARTERIOVENOUS GORETEX GRAFT Left 12/06/2014   Procedure: REVISION OF ARTERIOVENOUS GORETEX GRAFT USING 246mmx10cm;  Surgeon: Fransisco HertzBrian L Chen, MD;  [facility];  Service: Vascular;  Laterality: Left;  . STOMACH SURGERY  2009   achalasia  . SUBCLAVIAN STENT PLACEMENT  12/20/2011  . TONSILLECTOMY      Social History   Social History  . Marital status: Divorced    Spouse name: N/A  . Number of children: 1  . Years of education: Bachelors   Occupational History  . Disabled    Social History Main Topics  . Smoking status: Never Smoker  . Smokeless tobacco: Former Neurosurgeon    Types: Chew    Quit date: 03/18/2003     Comment: chewed tobacco   . Alcohol use 1.2 oz/week    2 Shots of liquor per week     Comment: rarely; beer  . Drug use: No  . Sexual activity: Not Currently    Partners: Male   Other Topics Concern  . Not on file   Social History Narrative   Lives at home alone.   Right-handed.   Occasional caffeine use.   Divorced, 1 son, lives in [place]. occl ETOH.   Allergies  Allergen Reactions  . Shrimp [Shellfish Allergy] Shortness Of Breath  . Insulins Other (See Comments)    Patient unsure as to the kind of insulin but states that it has  previously caused seizures. This occurred in the hospital in 2006; but in most recent hospitalization (Jan 2013) received insulin but did not have reaction  . Atorvastatin Other (See Comments)    weakness  . Simvastatin Other (See Comments)     abnormal liver tests  . Ultram [Tramadol] Other (See Comments)    Liver enzyme      Outpatient Medications Prior to Visit  Medication Sig Dispense Refill  . acetaminophen (TYLENOL) 500 MG tablet Take 1,000 mg by mouth every 6 (six) hours as needed (pain).    Marland Kitchen amLODipine (NORVASC) 5 MG tablet Take 1 tablet (5 mg total) by mouth daily. 90 tablet 3  . aspirin EC 81 MG EC tablet Take 1 tablet (81 mg total) by mouth daily.    Marland Kitchen atorvastatin (LIPITOR) 20 MG tablet Take 1 tablet (20 mg total) by mouth daily. 30 tablet 1  . cinacalcet (SENSIPAR) 30 MG tablet Take 60 mg by mouth at bedtime. Reported on 07/05/2015    . clopidogrel (PLAVIX) 75 MG tablet Take 1 tablet (75 mg total) by mouth daily. 90 tablet 1  . febuxostat (ULORIC) 40 MG tablet Take 40 mg by mouth daily.     . isosorbide mononitrate (IMDUR) 60 MG 24 hr tablet Take 1/2 tablet twice daily 90 tablet 3  . lanthanum ([name]) 1000 MG chewable tablet Chew 2 tablets (2,000 mg total) by mouth 3 (three) times daily with meals. Yearly physical due in July must see MD for refills 540 tablet 0  . lidocaine-prilocaine (EMLA) cream Apply 1 application topically as needed. Apply as needed for dialysis site    . metoprolol (LOPRESSOR) 50 MG tablet Take 1 tablet (50 mg total) by mouth 2 (two) times daily. 180 tablet 3  . NITROSTAT 0.4 MG SL tablet PLACE 1 TABLET UNDER TONGUE AS NEEDED FOR CHEST PAIN EVERY 5 MINUTES (MAX 3 DOSES) 25 tablet 4  . terazosin (HYTRIN) 10 MG capsule TAKE 1 CAPSULE BY MOUTH DAILY AT BEDTIME 90 capsule 3  . ticagrelor (BRILINTA) 90 MG TABS tablet Take 90 mg by mouth 2 (two) times daily.    Marland Kitchen zolpidem (AMBIEN) 10 MG tablet Take 1 tablet (10 mg total) by mouth at bedtime as needed for  sleep. Please make return office visit for further refills (  Patient not taking: Reported on 01/08/2016) 90 tablet 1  . doxycycline (VIBRA-TABS) 100 MG tablet Take 1 tablet (100 mg total) by mouth 2 (two) times daily. (Patient not taking: Reported on 01/08/2016) 14 tablet 0   No facility-administered medications prior to visit.    No orders of the defined types were placed in this encounter.        Objective:   Physical Exam  Vitals:  Vitals:   01/08/16 1518  BP: 124/74  Pulse: 72  SpO2: 97%  Weight: 221 lb 9.6 oz (100.5 kg)  Height: 6' (1.829 m)    Constitutional/General:  Pleasant, well-nourished, well-developed, not in any distress,  Comfortably seating.  Well kempt  Body mass index is 30.05 kg/m. Wt Readings from Last 3 Encounters:  01/08/16 221 lb 9.6 oz (100.5 kg)  12/11/15 213 lb (96.6 kg)  11/08/15 213 lb 4 oz (96.7 kg)     HEENT: Pupils equal and reactive to light and accommodation. Anicteric sclerae. Normal nasal mucosa.   No oral  lesions,  mouth clear,  oropharynx clear, no postnasal drip. (-) Oral thrush. No dental caries.  Airway - Mallampati class IV  Neck: No masses. Midline trachea. No JVD, (-) LAD. (-) bruits appreciated.  Respiratory/Chest: Grossly normal chest. (-) deformity. (-) Accessory muscle use.  Symmetric expansion. (-) Tenderness on palpation.  Resonant on percussion.  Diminished BS on both lower lung zones. (-) wheezing, crackles, rhonchi (-) egophony  Cardiovascular: Regular rate and  rhythm, heart sounds normal, no murmur or gallops, no peripheral edema  Gastrointestinal:  Normal bowel sounds. Soft, non-tender. No hepatosplenomegaly.  (-) masses.   Musculoskeletal:  Normal muscle tone. Normal gait.   Extremities: Grossly normal. (-) clubbing, cyanosis.  (-) edema  Skin: (-) rash,lesions seen.   Neurological/Psychiatric : alert, oriented to time, place, person. Normal mood and affect          Assessment & Plan:  OSA  (obstructive sleep apnea) Patient had Colonoscopy 10 yrs ago and had o2 desatn and apneas.  Ended up having sleep study.  PSG was (+). Did not get a chance to be on cpap. Sx persisted.   Patient has snoring, has witnessed apneas, has gasping/choking. Sleep 6 hrs/night. Wakes up frequently during the night. Has unrefreshed sleep. Hypersomnia affects his fxnality. Naps daily. Has unrefreshed sleep despite napping. ESS 12.   (-) abnormal behavior in sleep.  Pt has ESRD, gets HD MWF.   Plan :  We discussed about the diagnosis of Obstructive Sleep Apnea (OSA) and implications of untreated OSA. We discussed about CPAP and BiPaP as possible treatment options.    We will schedule the patient for a sleep study. Plan for a lab sleep study. Anticipate no issues with cpap. His mother has OSA. Likely mod-severe. Has CKD on HD.    Patient was instructed to call the office if he/she has not heard back from the office 1-2 weeks after the sleep study.   Patient was instructed to call the office if he/she is having issues with the PAP device.   We discussed good sleep hygiene.   Patient was advised not to engage in activities requiring concentration and/or vigilance if he/she is sleepy.  Patient was advised not to drive if he/she is sleepy.       Thank you very much for letting me participate in this patient's care. Please do not hesitate to give me a call if you have any questions or concerns regarding the treatment plan.   Patient  will follow up with me in 6-8 weeks.     Pollie Meyer, MD 01/08/2016   3:53 PM Pulmonary and Critical Care Medicine [facility] Pager: [phone] Office: [phone], Fax: [phone]

## 2016-01-08 NOTE — Assessment & Plan Note (Signed)
Patient had Colonoscopy 10 yrs ago and had o2 desatn and apneas.  Ended up having sleep study.  PSG was (+). Did not get a chance to be on cpap. Sx persisted.   Patient has snoring, has witnessed apneas, has gasping/choking. Sleep 6 hrs/night. Wakes up frequently during the night. Has unrefreshed sleep. Hypersomnia affects his fxnality. Naps daily. Has unrefreshed sleep despite napping. ESS 12.   (-) abnormal behavior in sleep.  Pt has ESRD, gets HD MWF.   Plan :  We discussed about the diagnosis of Obstructive Sleep Apnea (OSA) and implications of untreated OSA. We discussed about CPAP and BiPaP as possible treatment options.    We will schedule the patient for a sleep study. Plan for a lab sleep study. Anticipate no issues with cpap. His mother has OSA. Likely mod-severe. Has CKD on HD.    Patient was instructed to call the office if he/she has not heard back from the office 1-2 weeks after the sleep study.   Patient was instructed to call the office if he/she is having issues with the PAP device.   We discussed good sleep hygiene.   Patient was advised not to engage in activities requiring concentration and/or vigilance if he/she is sleepy.  Patient was advised not to drive if he/she is sleepy.

## 2016-01-09 DIAGNOSIS — N2581 Secondary hyperparathyroidism of renal origin: Secondary | ICD-10-CM | POA: Diagnosis not present

## 2016-01-09 DIAGNOSIS — N186 End stage renal disease: Secondary | ICD-10-CM | POA: Diagnosis not present

## 2016-01-10 ENCOUNTER — Encounter: Payer: Self-pay | Admitting: Psychology

## 2016-01-10 ENCOUNTER — Ambulatory Visit (INDEPENDENT_AMBULATORY_CARE_PROVIDER_SITE_OTHER): Payer: Medicare Other | Admitting: Psychology

## 2016-01-10 DIAGNOSIS — R413 Other amnesia: Secondary | ICD-10-CM

## 2016-01-10 DIAGNOSIS — I679 Cerebrovascular disease, unspecified: Secondary | ICD-10-CM

## 2016-01-10 DIAGNOSIS — G3184 Mild cognitive impairment, so stated: Secondary | ICD-10-CM

## 2016-01-10 NOTE — Progress Notes (Signed)
NEUROPSYCHOLOGICAL INTERVIEW (CPT: [id number])  Name: [name] Date of Birth: 04/14/1960 Date of Interview: 01/10/2016  Reason for Referral:  [name] is a 55 y.o., right-handed, divorced male who is referred for neuropsychological evaluation by Dr. Terrace ArabiaYan of [facility] due to concerns about progressive memory loss and history of multiple strokes. This patient is accompanied in the office by his sister, [name], who supplements the history.  History of Presenting Problem:  [name] reported a history of six strokes. Medical records list strokes in 1993, 2005, 2006, 2007, and 2015. His most recent brain MRI on file is from 12/29/2013, when he was admitted for left sided weakness. This reportedly showed acute infarct head of the caudate on the left, moderate chronic ischemic changes, and severe intracranial atherosclerotic disease. An updated MRI of the brain has been ordered. The patient also has a history of coronary artery disease (s/p CABG 03/2011) and end stage renal disease (on dialysis 3x/week for over a year).   [name] was most recently seen by Dr. Terrace ArabiaYan on 12/11/2015. MMSE was 30/30.   At today's appointment, the patient did endorse memory difficulties, stating, "When my mind is not on something, I totally forget." He feels these difficulties have gradually come on over time rather than an abrupt onset. His sister agrees that there has been a gradual onset and progressive deterioration of memory over the past 10 years, with increased decline after each stroke.   Upon direct questioning, the patient and his sister reported the following:   Forgetting recent conversations/events: Patient says no; sister says yes, "he is very forgetful".  Repeating statements/questions: Yes, occasionally, per sister. Misplacing/losing items: Patient says no; sister says yes (eg watches and phone) and then he agrees with her. Forgetting appointments or other obligations: Sister  has to remind him of appointments. He is late for dialysis frequently. Forgetting to take medications: No problems per patient; sister says she has concerns about his management of medications. He does not use a weekly pillbox and instead takes directly from the bottles.  Difficulty concentrating: Sometimes, per patient. Starting but not finishing tasks: Yes Distracted easily: Yes Processing information more slowly: Yes  Word-finding difficulty: Yes Writing difficulty: No Spelling difficulty: No Comprehension difficulty: Yes  Getting lost when driving: Patient denies Making wrong turns when driving: patient denies Uncertain about directions when driving or passenger: Patient denies   There is a reported history of Alzheimer's disease in the patient's mother who currently lives in a nursing home. The patient's sister shared this information confidentially, noting that the patient does not yet know his mother has been diagnosed with AD.    Physically, the patient endorses fatigue on some days and regular napping. His balance is off and his ability to walk is variable depending on the day. He sometimes requires a cane and sometimes doesn't. His leg weakness comes and goes. He has had falls. He denies hitting his head or losing consciousness. He thinks his last fall was about a month ago.   The patient's sister reported concerns about his increased sleeping. He naps a lot and does not have any motivation to do much of anything. He spends most of his time either sleeping or watching television (sports). He reports that his mood is "happy, positive" but his sister reports he is apathetic. He denies any stress or anxiety. He denies reduced appetite but his sister does not think he eats well. He has apparently lost 20 lbs since May.  His sister noted that he demonstrates less cleanliness/orderliness in his home. He lives alone. He has to be cued to wash dishes, do laundry and do basic cleaning. She  reported this has been a "constant battle" for a couple of years.  The patient has minimal social interaction. His sister sees him regularly and clearly helps him a lot. He visits his mother in the nursing home once a week. He does not have relationships with his other siblings or his son (who lives in [place]).   Current Functioning: The patient is unemployed; he has been on disability since 2005.  He continues to drive, manage his medications and do some cooking on his own. He is independent for all basic ADLs. His sister had to take over management of his bills and finances over the past year because he had had several utilities shut off due to not paying bills. She also helps him with management of his appointments; she reminds him of appointments and usually goes with him.   Psychiatric History: The patient reported a history of depression. He reported he received treatment from [facility] over five years ago. He denied history of hallucinations, psychosis, anxiety or other mental health disorder. He denied past or present suicidal ideation/intention. He has never attempted suicide. He denied history of substance abuse or dependence.   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 He has been on disability since 2005.  Marital history: Married x1 - Divorced in 2009. Currently single. Children: One son, no relationship with him. Alcohol/Tobacco/Substances: Once in a while- minimal alcohol. Never a smoker. No substance abuse.  Medical History: Past Medical History:  Diagnosis Date  . ACHALASIA   . ANEMIA-NOS   . CAD (coronary artery disease), 3 vessel significant disease.    . CEREBROVASCULAR ACCIDENT, HX OF   . CKD (chronic kidney disease) stage 4, GFR 15-29 ml/min (HCC)   . Constipation   .  DEGENERATIVE JOINT DISEASE, SHOULDER    knee  . DEPRESSION    2006- not depressed any longer  . Family history of adverse reaction to anesthesia    Mother, hard to awaken  . GERD    no meds required  . Gout    takes Uloric daily  . H/O hiatal hernia   . Headache(784.0)   . Heart attack   . History of colon polyps   . History of diastolic dysfunction, grade 2 by echo   . HYPERLIPIDEMIA    takes crestor daily  . Hyperlipidemia   . HYPERTENSION    takes Norvasc,Catapress,Hydralazine,Imdur,and Metoprolol daily  . Impaired glucose tolerance   . Insomnia    takes Ambien nightly  . NSTEMI (non-ST elevated myocardial infarction), 06/30/11 07/01/2011  . RENAL CALCULUS, HX OF   . RENAL INSUFFICIENCY    12/2013- TThSat  . S/P coronary artery stent placement, PTCA/DES Resolute to mid-LAD via the LIMA graft, and PTCA of apical 95% stenosis 07/05/11 07/04/2011  . S/P PTCA (percutaneous transluminal coronary angioplasty), 06/30/11 07/01/2011  . Seizures (HCC)    INSULIN INDUCED  . Sleep apnea    SLEEP STUDY IN [place] , NO MACHINE YET  1 YEAR  . Stroke (HCC)    93/2005/2006/2007;left sided weakness.  1993 12/2013      Current Medications:  Outpatient Encounter Prescriptions as of 01/10/2016  Medication Sig  . acetaminophen (TYLENOL) 500  MG tablet Take 1,000 mg by mouth every 6 (six) hours as needed (pain).  Marland Kitchen amLODipine (NORVASC) 5 MG tablet Take 1 tablet (5 mg total) by mouth daily.  Marland Kitchen aspirin EC 81 MG EC tablet Take 1 tablet (81 mg total) by mouth daily.  Marland Kitchen atorvastatin (LIPITOR) 20 MG tablet Take 1 tablet (20 mg total) by mouth daily.  . cinacalcet (SENSIPAR) 30 MG tablet Take 60 mg by mouth at bedtime. Reported on 07/05/2015  . clopidogrel (PLAVIX) 75 MG tablet Take 1 tablet (75 mg total) by mouth daily.  . febuxostat (ULORIC) 40 MG tablet Take 40 mg by mouth daily.   . isosorbide mononitrate (IMDUR) 60 MG 24 hr tablet Take 1/2 tablet twice daily  . lanthanum ([name]) 1000 MG  chewable tablet Chew 2 tablets (2,000 mg total) by mouth 3 (three) times daily with meals. Yearly physical due in July must see MD for refills  . lidocaine-prilocaine (EMLA) cream Apply 1 application topically as needed. Apply as needed for dialysis site  . metoprolol (LOPRESSOR) 50 MG tablet Take 1 tablet (50 mg total) by mouth 2 (two) times daily.  Marland Kitchen NITROSTAT 0.4 MG SL tablet PLACE 1 TABLET UNDER TONGUE AS NEEDED FOR CHEST PAIN EVERY 5 MINUTES (MAX 3 DOSES)  . terazosin (HYTRIN) 10 MG capsule TAKE 1 CAPSULE BY MOUTH DAILY AT BEDTIME  . ticagrelor (BRILINTA) 90 MG TABS tablet Take 90 mg by mouth 2 (two) times daily.  Marland Kitchen zolpidem (AMBIEN) 10 MG tablet Take 1 tablet (10 mg total) by mouth at bedtime as needed for sleep. Please make return office visit for further refills (Patient not taking: Reported on 01/08/2016)   No facility-administered encounter medications on file as of 01/10/2016.      Behavioral Observations:   Appearance: Appropriately dressed and groomed Gait: Ambulated with a cane, no gross abnormalities observed Speech: Fluent; normal rate, rhythm and volume. Sparse speech; often one-word responses to questions.  Thought process: Linear Affect: Blunted Interpersonal: Pleasant, appropriate   TESTING: There is medical necessity to proceed with neuropsychological assessment as the results will be used to aid in differential diagnosis and clinical decision-making and to inform specific treatment recommendations. Per the patient, his sister and medical records reviewed, there has been a change in cognitive functioning and a reasonable suspicion of vascular dementia.   PLAN: The patient will return for a full battery of neuropsychological testing with a psychometrician under my supervision. Education regarding testing procedures was provided. Subsequently, the patient will see this provider for a follow-up session at which time his test performances and my impressions and treatment  recommendations will be reviewed in detail.   Full neuropsychological evaluation report to follow.

## 2016-01-14 DIAGNOSIS — N2581 Secondary hyperparathyroidism of renal origin: Secondary | ICD-10-CM | POA: Diagnosis not present

## 2016-01-14 DIAGNOSIS — N186 End stage renal disease: Secondary | ICD-10-CM | POA: Diagnosis not present

## 2016-01-15 ENCOUNTER — Ambulatory Visit (INDEPENDENT_AMBULATORY_CARE_PROVIDER_SITE_OTHER): Payer: Medicare Other | Admitting: Psychology

## 2016-01-15 DIAGNOSIS — R413 Other amnesia: Secondary | ICD-10-CM

## 2016-01-15 DIAGNOSIS — Z992 Dependence on renal dialysis: Secondary | ICD-10-CM | POA: Diagnosis not present

## 2016-01-15 DIAGNOSIS — N186 End stage renal disease: Secondary | ICD-10-CM | POA: Diagnosis not present

## 2016-01-15 DIAGNOSIS — I120 Hypertensive chronic kidney disease with stage 5 chronic kidney disease or end stage renal disease: Secondary | ICD-10-CM | POA: Diagnosis not present

## 2016-01-15 DIAGNOSIS — I679 Cerebrovascular disease, unspecified: Secondary | ICD-10-CM

## 2016-01-15 NOTE — Progress Notes (Signed)
   Neuropsychology Note  [name] returned today for 2 hours of neuropsychological testing with technician, Wallace Keller, BS, under the supervision of Dr. Elvis Coil. The patient did not appear overtly distressed by the testing session, per behavioral observation or via self-report to the technician. Rest breaks were offered. [name] will return within 2 weeks for a feedback session with Dr. Alinda Dooms at which time his test performances, clinical impressions and treatment recommendations will be reviewed in detail. The patient understands he can contact our office should he require our assistance before this time.  Full report to follow.

## 2016-01-16 DIAGNOSIS — D631 Anemia in chronic kidney disease: Secondary | ICD-10-CM | POA: Diagnosis not present

## 2016-01-16 DIAGNOSIS — N2581 Secondary hyperparathyroidism of renal origin: Secondary | ICD-10-CM | POA: Diagnosis not present

## 2016-01-16 DIAGNOSIS — N186 End stage renal disease: Secondary | ICD-10-CM | POA: Diagnosis not present

## 2016-01-16 DIAGNOSIS — D509 Iron deficiency anemia, unspecified: Secondary | ICD-10-CM | POA: Diagnosis not present

## 2016-01-17 ENCOUNTER — Ambulatory Visit (HOSPITAL_BASED_OUTPATIENT_CLINIC_OR_DEPARTMENT_OTHER): Payer: Medicare Other | Attending: Pulmonary Disease | Admitting: Pulmonary Disease

## 2016-01-17 DIAGNOSIS — R0683 Snoring: Secondary | ICD-10-CM | POA: Diagnosis not present

## 2016-01-17 DIAGNOSIS — I493 Ventricular premature depolarization: Secondary | ICD-10-CM | POA: Insufficient documentation

## 2016-01-17 DIAGNOSIS — G4733 Obstructive sleep apnea (adult) (pediatric): Secondary | ICD-10-CM | POA: Insufficient documentation

## 2016-01-17 DIAGNOSIS — Z79899 Other long term (current) drug therapy: Secondary | ICD-10-CM | POA: Insufficient documentation

## 2016-01-18 DIAGNOSIS — D509 Iron deficiency anemia, unspecified: Secondary | ICD-10-CM | POA: Diagnosis not present

## 2016-01-18 DIAGNOSIS — N2581 Secondary hyperparathyroidism of renal origin: Secondary | ICD-10-CM | POA: Diagnosis not present

## 2016-01-18 DIAGNOSIS — N186 End stage renal disease: Secondary | ICD-10-CM | POA: Diagnosis not present

## 2016-01-18 DIAGNOSIS — D631 Anemia in chronic kidney disease: Secondary | ICD-10-CM | POA: Diagnosis not present

## 2016-01-21 DIAGNOSIS — N186 End stage renal disease: Secondary | ICD-10-CM | POA: Diagnosis not present

## 2016-01-21 DIAGNOSIS — D631 Anemia in chronic kidney disease: Secondary | ICD-10-CM | POA: Diagnosis not present

## 2016-01-21 DIAGNOSIS — D509 Iron deficiency anemia, unspecified: Secondary | ICD-10-CM | POA: Diagnosis not present

## 2016-01-21 DIAGNOSIS — N2581 Secondary hyperparathyroidism of renal origin: Secondary | ICD-10-CM | POA: Diagnosis not present

## 2016-01-22 ENCOUNTER — Telehealth: Payer: Self-pay | Admitting: Pulmonary Disease

## 2016-01-22 DIAGNOSIS — G4733 Obstructive sleep apnea (adult) (pediatric): Secondary | ICD-10-CM | POA: Diagnosis not present

## 2016-01-22 NOTE — Telephone Encounter (Signed)
  Please call the pt and tell the pt the [facility] SLEEP STUDY  showed OSA .   Pt stops breathing 11   times an hour.   Please order autoCPAP 5-15 cm H2O. Patient will need a mask fitting session. Patient will need a 1 month download.   Patient needs to be seen by me or any of the NPs/APPs  4-6 weeks after obtaining the cpap machine. Let me know if you receive this.   Thanks!   J. Alexis FrockAngelo A de Dios, MD 01/22/2016, 6:44 AM

## 2016-01-22 NOTE — Procedures (Signed)
    NAME: Gregory CannerMichael B Glenn DATE OF BIRTH:  08/29/1960 MEDICAL RECORD NUMBER [id number]  LOCATION: [facility]  PHYSICIAN: Daneen SchickJose Angelo A De Dios  DATE OF STUDY: 01/17/2016  CLINICAL INFORMATION  Sleep Study Type: NPSG  Indication for sleep study: OSA  Epworth Sleepiness Score: 3   SLEEP STUDY TECHNIQUE  As per the AASM Manual for the Scoring of Sleep and Associated Events v2.3 (April 2016) with a hypopnea requiring 4% desaturations.  The channels recorded and monitored were frontal, central and occipital EEG, electrooculogram (EOG), submentalis EMG (chin), nasal and oral airflow, thoracic and abdominal wall motion, anterior tibialis EMG, snore microphone, electrocardiogram, and pulse oximetry.   MEDICATIONS  Medications self-administered by patient taken the night of the study : SENSIPAR   SLEEP ARCHITECTURE  The study was initiated at 9:45:35 PM and ended at 4:58:43 AM.  Sleep onset time was 77.0 minutes and the sleep efficiency was 73.7%. The total sleep time was 319.0 minutes.  Stage REM latency was 91.5 minutes.  The patient spent 9.09% of the night in stage N1 sleep, 66.77% in stage N2 sleep, 0.00% in stage N3 and 24.14% in REM.  Alpha intrusion was absent.  Supine sleep was 68.25%.   RESPIRATORY PARAMETERS  1. The overall apnea/hypopnea index (AHI) was 11.1 per hour. There were 27 total apneas, including 25 obstructive, 2 central and 0 mixed apneas. There were 32 hypopneas and 69 RERAs.  2. The AHI during Stage REM sleep was 35.1 per hour. 3. AHI while supine was 15.7 per hour.  4. The mean oxygen saturation was 94.68%. The minimum SpO2 during sleep was 85.00%.  5. Soft snoring was noted during this study.  CARDIAC DATA  The 2 lead EKG demonstrated sinus rhythm. The mean heart rate was 78.11 beats per minute. Other EKG findings include: PVCs.   LEG MOVEMENT DATA  The total PLMS were 0 with a resulting PLMS index of 0.00. Associated arousal with leg  movement index was 0.0 .  IMPRESSIONS  1. Mild obstructive sleep apnea occurred during this study (AHI = 11.1/h). 2. No significant central sleep apnea occurred during this study (CAI = 0.4/h). 3. Mild oxygen desaturation was noted during this study (Min O2 = 85.00%). 4. The patient snored with Soft snoring volume. 5. EKG findings include PVCs. 6. Clinically significant periodic limb movements did not occur during sleep. No significant associated arousals.  DIAGNOSIS  Obstructive Sleep Apnea (327.23 [G47.33 ICD-10]), worse during REM sleep.  RECOMMENDATIONS  1. Given patient's symptoms and co-morbidities, suggest trial with autocpap 5-15 cm water. He will need a mask fitting session to determine best mask fit. He will need a 1 month download to determine cpap efficacy. 2. Avoid alcohol, sedatives and other CNS depressants that may worsen sleep apnea and disrupt normal sleep architecture. 3. Sleep hygiene should be reviewed to assess factors that may improve sleep quality. 4. Weight management and regular exercise should be initiated or continued if appropriate. 5. Follow up in the office 4-6 weeks after obtaining cpap machine.  Pollie MeyerJ. Angelo A de Dios, MD 01/22/2016, 6:41 AM [facility] Pager [phone] After 3 pm or if no answer, call [phone]

## 2016-01-23 DIAGNOSIS — D509 Iron deficiency anemia, unspecified: Secondary | ICD-10-CM | POA: Diagnosis not present

## 2016-01-23 DIAGNOSIS — D631 Anemia in chronic kidney disease: Secondary | ICD-10-CM | POA: Diagnosis not present

## 2016-01-23 DIAGNOSIS — N2581 Secondary hyperparathyroidism of renal origin: Secondary | ICD-10-CM | POA: Diagnosis not present

## 2016-01-23 DIAGNOSIS — N186 End stage renal disease: Secondary | ICD-10-CM | POA: Diagnosis not present

## 2016-01-23 NOTE — Progress Notes (Signed)
NEUROPSYCHOLOGICAL EVALUATION   Name:    [name]  Date of Birth:   Sep 23, 1960 Date of Interview:  01/10/2016 Date of Testing:  01/15/2016  Date of Feedback:  01/24/2016      Background Information:  Reason for Referral:  [name] is a 55 y.o., right-handed, divorced male referred by Dr. Krista Blue of [facility] to assess his current level of cognitive functioning and assist in differential diagnosis. The current evaluation consisted of a review of available medical records, an interview with the patient and his sister, [name], and the completion of a neuropsychological testing battery. Informed consent was obtained.  History of Presenting Problem:  [name] reported a history of six strokes. Medical records list strokes in 1993, 2005, 2006, 2007, and 2015. His most recent brain MRI on file is from 12/29/2013, when he was admitted for left sided weakness. This reportedly showed acute infarct head of the caudate on the left, moderate chronic ischemic changes, and severe intracranial atherosclerotic disease. An updated MRI of the brain has been ordered. The patient also has a history of coronary artery disease (s/p CABG 03/2011) and end stage renal disease (on dialysis 3x/week for over a year).   [name] was most recently seen by Dr. Krista Blue on 12/11/2015. MMSE was 30/30.   At today's appointment, the patient did endorse memory difficulties, stating, "When my mind is not on something, I totally forget." He feels these difficulties have gradually come on over time rather than an abrupt onset. His sister agrees that there has been a gradual onset and progressive deterioration of memory over the past 10 years, with increased decline after each stroke.   Upon direct questioning, the patient and his sister reported the following:   Forgetting recent conversations/events: Patient says no; sister says yes, "he is very forgetful".  Repeating statements/questions: Yes, occasionally, per  sister. Misplacing/losing items: Patient says no; sister says yes (eg watches and phone) and then he agrees with her. Forgetting appointments or other obligations: Sister has to remind him of appointments. He is late for dialysis frequently. Forgetting to take medications: No problems per patient; sister says she has concerns about his management of medications. He does not use a weekly pillbox and instead takes directly from the bottles.  Difficulty concentrating: Sometimes, per patient. Starting but not finishing tasks: Yes Distracted easily: Yes Processing information more slowly: Yes  Word-finding difficulty: Yes Writing difficulty: No Spelling difficulty: No Comprehension difficulty: Yes  Getting lost when driving: Patient denies Making wrong turns when driving: patient denies Uncertain about directions when driving or passenger: Patient denies   There is a reported history of Alzheimer's disease in the patient's mother who currently lives in a nursing home. The patient's sister shared this information confidentially, noting that the patient does not yet know his mother has been diagnosed with AD.    Physically, the patient endorses fatigue on some days and regular napping. His balance is off and his ability to walk is variable depending on the day. He sometimes requires a cane and sometimes doesn't. His leg weakness comes and goes. He has had falls. He denies hitting his head or losing consciousness. He thinks his last fall was about a month ago.   A sleep study performed on 01/17/2016 revealed mild obstructive sleep apnea, requiring treatment with a CPAP. The patient's sister reported concerns about his increased sleeping. He naps a lot and does not have any motivation to do much of anything. He spends most of  his time either sleeping or watching television (sports). He reports that his mood is "happy, positive" but his sister reports he is apathetic. He denies any stress or  anxiety. He denies reduced appetite but his sister does not think he eats well. He has apparently lost 20 lbs since May.   His sister noted that he demonstrates less cleanliness/orderliness in his home. He lives alone. He has to be cued to wash dishes, do laundry and do basic cleaning. She reported this has been a "constant battle" for a couple of years.  The patient has minimal social interaction. His sister sees him regularly and clearly helps him a lot. He visits his mother in the nursing home once a week. He does not have relationships with his other siblings or his son (who lives in [place]).   Current Functioning: The patient is unemployed; he has been on disability since 2005.  He continues to drive, manage his medications and do some cooking on his own. He is independent for all basic ADLs. His sister had to take over management of his bills and finances over the past year because he had had several utilities shut off due to not paying bills. She also helps him with management of his appointments; she reminds him of appointments and usually goes with him.   Psychiatric History: The patient reported a history of depression. He reported he received treatment from [facility] over five years ago. He denied history of hallucinations, psychosis, anxiety or other mental health disorder. He denied past or present suicidal ideation/intention. He has never attempted suicide. He denied history of substance abuse or dependence.   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 He has been on disability since 2005.  Marital history: Married x1 - Divorced in 2009. Currently single. Children: One son, no relationship with him. Alcohol/Tobacco/Substances: Once in a while- minimal alcohol. Never a smoker. No  substance abuse.   Medical History:  Past Medical History:  Diagnosis Date  . ACHALASIA   . ANEMIA-NOS   . CAD (coronary artery disease), 3 vessel significant disease.    . CEREBROVASCULAR ACCIDENT, HX OF   . CKD (chronic kidney disease) stage 4, GFR 15-29 ml/min (HCC)   . Constipation   . DEGENERATIVE JOINT DISEASE, SHOULDER    knee  . DEPRESSION    2006- not depressed any longer  . Family history of adverse reaction to anesthesia    Mother, hard to awaken  . GERD    no meds required  . Gout    takes Uloric daily  . H/O hiatal hernia   . Headache(784.0)   . Heart attack   . History of colon polyps   . History of diastolic dysfunction, grade 2 by echo   . HYPERLIPIDEMIA    takes crestor daily  . Hyperlipidemia   . HYPERTENSION    takes Norvasc,Catapress,Hydralazine,Imdur,and Metoprolol daily  . Impaired glucose tolerance   . Insomnia    takes Ambien nightly  . NSTEMI (non-ST elevated myocardial infarction), 06/30/11 07/01/2011  . RENAL CALCULUS, HX OF   . RENAL INSUFFICIENCY    12/2013- TThSat  . S/P coronary artery stent placement, PTCA/DES Resolute to mid-LAD via the LIMA graft, and PTCA of apical 95% stenosis 07/05/11 07/04/2011  . S/P PTCA (percutaneous transluminal coronary angioplasty), 06/30/11 07/01/2011  . Seizures (HCC)    INSULIN  INDUCED  . Sleep apnea    SLEEP STUDY IN [place] , NO MACHINE YET  1 YEAR  . Stroke (HCC)    93/2005/2006/2007;left sided weakness.  1993 12/2013    Current medications:  Outpatient Encounter Prescriptions as of 01/24/2016  Medication Sig  . acetaminophen (TYLENOL) 500 MG tablet Take 1,000 mg by mouth every 6 (six) hours as needed (pain).  Marland Kitchen amLODipine (NORVASC) 5 MG tablet Take 1 tablet (5 mg total) by mouth daily.  Marland Kitchen aspirin EC 81 MG EC tablet Take 1 tablet (81 mg total) by mouth daily.  Marland Kitchen atorvastatin (LIPITOR) 20 MG tablet Take 1 tablet (20 mg total) by mouth daily.  . cinacalcet (SENSIPAR) 30 MG tablet Take 60 mg by mouth at  bedtime. Reported on 07/05/2015  . clopidogrel (PLAVIX) 75 MG tablet Take 1 tablet (75 mg total) by mouth daily.  . febuxostat (ULORIC) 40 MG tablet Take 40 mg by mouth daily.   . isosorbide mononitrate (IMDUR) 60 MG 24 hr tablet Take 1/2 tablet twice daily  . lanthanum ([name]) 1000 MG chewable tablet Chew 2 tablets (2,000 mg total) by mouth 3 (three) times daily with meals. Yearly physical due in July must see MD for refills  . lidocaine-prilocaine (EMLA) cream Apply 1 application topically as needed. Apply as needed for dialysis site  . metoprolol (LOPRESSOR) 50 MG tablet Take 1 tablet (50 mg total) by mouth 2 (two) times daily.  Marland Kitchen NITROSTAT 0.4 MG SL tablet PLACE 1 TABLET UNDER TONGUE AS NEEDED FOR CHEST PAIN EVERY 5 MINUTES (MAX 3 DOSES)  . terazosin (HYTRIN) 10 MG capsule TAKE 1 CAPSULE BY MOUTH DAILY AT BEDTIME  . ticagrelor (BRILINTA) 90 MG TABS tablet Take 90 mg by mouth 2 (two) times daily.  Marland Kitchen zolpidem (AMBIEN) 10 MG tablet Take 1 tablet (10 mg total) by mouth at bedtime as needed for sleep. Please make return office visit for further refills (Patient not taking: Reported on 01/08/2016)   No facility-administered encounter medications on file as of 01/24/2016.      Current Examination:  Behavioral Observations: Appearance: Appropriately dressed and groomed Gait: Ambulated with a cane, no gross abnormalities observed Speech: Fluent; normal rate, rhythm and volume. Sparse speech; often one-word responses to questions.  Thought process: Linear Affect: Blunted Interpersonal: Pleasant, appropriate Orientation: Oriented to person, place, and day and year. One day off on the date. Accurately named the current President and his predecessor.  Tests Administered: . Test of Premorbid Functioning (TOPF) . Wechsler Adult Intelligence Scale-Fourth Edition (WAIS-IV): Similarities, Block Design, Matrix Reasoning, Arithmetic, Symbol Search, Coding and Digit Span subtests . [place] Verbal  Learning Test - 2nd Edition (CVLT-2) Standard Form . [employer] (WCST) . Repeatable Battery for the Assessment of Neuropsychological Status (RBANS) Form A:  Figure Copy and Recall Subtests and Story Memory and Recall Subtests . Neuropsychological Assessment Battery (NAB) Language Module, Form 1:  Auditory Comprehension and Naming Subtests . Controlled Oral Word Association Test (COWAT) . Trail Making Test A and B . Clock Drawing Test . Reola Calkins Depression Inventory - Second edition (BDI-II) . Generalized Anxiety Disorder - 7 Item Screener (GAD-7)  Test Results: Note: Standardized scores are presented only for use by appropriately trained professionals and to allow for any future test-retest comparison. These scores should not be interpreted without consideration of all the information that is contained in the rest of the report. The most recent standardization samples from the test publisher or other sources were used whenever possible to derive  standard scores; scores were corrected for age, gender, ethnicity and education when available.   Test Scores:  Test Name Raw Score Standardized Score Descriptor  TOPF 31/70 SS=  90 Average  WAIS-IV Subtests     Similarities 22/36 ss= 8 Average  Block Design 20/66 ss= 6 Low average  Matrix Reasoning 12/26 ss= 8 Average  Arithmetic 10/22 ss= 7 Low average  Symbol Search 15/60 ss=  5 Borderline  Coding 48/135 ss= 8 Average  Digit Span 16/48 ss= 5 Borderline  CVLT-II Scores     Trial 1 5/16 Z= -1 Low average  Trial 5 9/16 Z= -1 Low average  Trials 1-5 total 38/80 T= 44 Average  SD Free Recall 7/16 Z= -0.5 Average  SD Cued Recall 8/16 Z= -1 Low average  LD Free Recall 8/16 Z=  -0.5 Average  LD Cued Recall 11/16 Z= 0 Average  Recognition Discriminability 16/16 hits, 3 false positives Z= 1 High average  Forced Choice Recognition 16/16  WNL  WCST     Total Errors 45 T= 23 Severely impaired  Perseverative Responses 55 T= <20 Severely  impaired  Perseverative Errors 40 T= <20 Severely impaired  Conceptual Level Responses 3 T= 20 Severely impaired  Categories Completed 0 2-5%   Trials to Complete 1st Category 65 <1%ile Severely impaired  Failure to Maintain Set 0  Impaired  RBANS Subtest     Figure Copy 15/20 Z= -2.3 Impaired  Figure Recall 16/20 Z= 0.8 High average  Story Memory  19/24 Z= 0.4 Average  Story Recall 9/12 Z=-0.1 Average  NAB Language Subtests     Auditory Comprehension 87/89 T= 49 Average  Naming 30/31 T= 51 Average  COWAT-FAS 15 T= 23 Severely impaired  COWAT-Animals 14 T= 35 Borderline  Trail Making Test A  44" 0 errors T= 42 Low average  Trail Making Test B  184" 1 error T= 5 Severely impaired  Clock Drawing   WNL  BDI-II  7/63 WNL  GAD-7  0/21 WNL     Description of Test Results:  Premorbid verbal intellectual abilities were estimated to have been within the average range based on a test of word reading. Psychomotor processing speed ranged from borderline to average. Auditory attention and working memory were borderline to low average. Visual-spatial construction was variable. His drawn reproduction of a complex geometric figure was impaired; all features were present but many were imprecise. Additionally, there was evidence of micrographia. His ability to manipulate three dimensional blocks to match a stimulus was low average. Language abilities were variable. Specifically, confrontation naming was average, and semantic verbal fluency was borderline impaired. Auditory comprehension was average. With regard to verbal memory, encoding and acquisition of non-contextual information (i.e., word list) was average across five learning trials. After an interference task, free recall was average. He recalled one additional word with semantic cueing (low average). After a 20 minute delay, free recall was average. He recalled three additional words with semantic cueing (average). Performance on a yes/no  recognition task was high average. On another verbal memory test, encoding and acquisition of contextual auditory information (i.e., short story) was average. After a delay, free recall was average. With regard to non-verbal memory, delayed free recall of visual information was high average. Executive functioning was variable. Mental flexibility and set-shifting were severely impaired on Trails B; he required significantly increased time to complete the task. Verbal fluency with phonemic search restrictions was severely impaired. Verbal abstract reasoning was average. Non-verbal abstract reasoning was average. Deductive reasoning  and problem-solving were severely impaired. Performance on a clock drawing task was within normal limits. On self-report questionnaires, the patient's responses were not  indicative of clinically significant depression or anxiety at the present time.   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 Results of this evaluation revealed deficits in auditory attention and working memory, verbal fluency, mental flexibility and set-shifting and deductive reasoning. Processing speed was variable, impaired at times. In addition to cognitive deficits demonstrated on objective testing, there is evidence that the patient's cognitive deficits are interfering with his ability to perform instrumental ADLs such as management of his finances and appointments. 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  The patient's history of multiple CVAs, with cognitive decline following each one, in combination with his cognitive profile reflecting frontal-subcortical dysfunction, are all in line with vascular etiology.  Given the significant number of strokes and other vascular risk factors, he is fortunate to maintain a number of cognitive strengths, including learning and memory, language comprehension, and naming abilities.  Also, at this time, there is no evidence to suggest a superimposed early onset Alzheimer's disease.  The patient denies overt depression and anxiety, but I do suspect he is experiencing at least some degree of apathy and reduced motivation, which is a common post-stroke symptom. This, in combination with poor executive functioning and slow processing speed, is also contributing to reduced functioning in his daily life.   Recommendations/Plan: Based on the findings of the present evaluation, the following recommendations are offered:  1. Increasing routine and structure in daily life will assist in compensating for executive dysfunction. 2. Continued assistance by his sister with finances and appointments is recommended. 3. Compensatory strategies for executive dysfunction were reviewed with the patient and written material was given. 4. The patient may benefit from cognitive rehabilitation through [facility] for his attention and executive function deficits, but referral for this service is deferred to his neurologist's judgment.  5. Treatment of sleep apnea with CPAP may enhance cognitive functioning. The patient was educated regarding the potential cognitive benefits of CPAP treatment. 6. Obviously, optimal control of vascular risk factors is of utmost importance in order to reduce the risk of future stroke, heart attack and further cognitive decline.   Feedback to Patient: [name] and his sister returned for a feedback appointment on 01/24/2016 to review the results of his neuropsychological evaluation with this provider. 20 minutes face-to-face time was spent reviewing his test results, my impressions and my recommendations as detailed above.    Total time spent on this patient's case: [id number] unit for interview with psychologist; [phone] units of testing by psychometrician under psychologist's supervision; [phone] units for medical record review, scoring of neuropsychological tests,  interpretation of test results, preparation of this report, and review of results to the patient by psychologist.      Thank you for your referral of [name]. Please feel free to contact me if you have any questions or concerns regarding this report.

## 2016-01-23 NOTE — Telephone Encounter (Signed)
LM for pt

## 2016-01-24 ENCOUNTER — Ambulatory Visit (INDEPENDENT_AMBULATORY_CARE_PROVIDER_SITE_OTHER): Payer: Medicare Other | Admitting: Psychology

## 2016-01-24 ENCOUNTER — Encounter: Payer: Self-pay | Admitting: Psychology

## 2016-01-24 DIAGNOSIS — F01A0 Vascular dementia, mild, without behavioral disturbance, psychotic disturbance, mood disturbance, and anxiety: Secondary | ICD-10-CM

## 2016-01-24 DIAGNOSIS — F0150 Vascular dementia without behavioral disturbance: Secondary | ICD-10-CM

## 2016-01-24 DIAGNOSIS — I679 Cerebrovascular disease, unspecified: Secondary | ICD-10-CM

## 2016-01-24 DIAGNOSIS — I63522 Cerebral infarction due to unspecified occlusion or stenosis of left anterior cerebral artery: Secondary | ICD-10-CM

## 2016-01-25 DIAGNOSIS — N2581 Secondary hyperparathyroidism of renal origin: Secondary | ICD-10-CM | POA: Diagnosis not present

## 2016-01-25 DIAGNOSIS — N186 End stage renal disease: Secondary | ICD-10-CM | POA: Diagnosis not present

## 2016-01-25 DIAGNOSIS — D631 Anemia in chronic kidney disease: Secondary | ICD-10-CM | POA: Diagnosis not present

## 2016-01-25 DIAGNOSIS — D509 Iron deficiency anemia, unspecified: Secondary | ICD-10-CM | POA: Diagnosis not present

## 2016-01-25 NOTE — Telephone Encounter (Signed)
Spoke with Pt. Sister, she wanted a little more details about the sleep study and had some concerns. The patient does have an appointment with Tammy on Tuesday and she wanted to know if she should still keep the appointment. I did express to her since she is still having some concerns to keep the appointment and hold off on placing the order for the CPAP machine in case if she does see Tammy she changes her mind. She felt like that was a good idea. Nothing further is needed.

## 2016-01-28 DIAGNOSIS — N2581 Secondary hyperparathyroidism of renal origin: Secondary | ICD-10-CM | POA: Diagnosis not present

## 2016-01-28 DIAGNOSIS — D509 Iron deficiency anemia, unspecified: Secondary | ICD-10-CM | POA: Diagnosis not present

## 2016-01-28 DIAGNOSIS — N186 End stage renal disease: Secondary | ICD-10-CM | POA: Diagnosis not present

## 2016-01-28 DIAGNOSIS — D631 Anemia in chronic kidney disease: Secondary | ICD-10-CM | POA: Diagnosis not present

## 2016-01-29 ENCOUNTER — Ambulatory Visit (INDEPENDENT_AMBULATORY_CARE_PROVIDER_SITE_OTHER): Payer: Medicare Other | Admitting: Adult Health

## 2016-01-29 ENCOUNTER — Encounter: Payer: Self-pay | Admitting: Adult Health

## 2016-01-29 VITALS — BP 120/76 | HR 65 | Ht 72.0 in | Wt 218.6 lb

## 2016-01-29 DIAGNOSIS — G4733 Obstructive sleep apnea (adult) (pediatric): Secondary | ICD-10-CM | POA: Diagnosis not present

## 2016-01-29 DIAGNOSIS — I208 Other forms of angina pectoris: Secondary | ICD-10-CM | POA: Diagnosis not present

## 2016-01-29 NOTE — Patient Instructions (Signed)
Begin CPAP At bedtime  Goal is to wear at least 4-6hr each night  Work on weight loss.  Do not drive if sleepy .  Follow up with Dr. Christene Slatese Dios in 6 weeks with CPAP download after starting .

## 2016-01-29 NOTE — Progress Notes (Signed)
Subjective:    Patient ID: [name], male    DOB: 1960/06/17, 55 y.o.   MRN: [id number]  HPI 55 yo male seen for sleep consult 01/08/16 for snoring and hypersomnia found to have mild OSA on sleep study.  Hx of CVA   TEST  Sleep study 01/17/16 AHI 11.1 /h Sat low 85%.    01/29/2016 Follow up : OSA  Pt returns for 1 month follow up for OSA . Pt was seen for sleep consult last month for snoring, witnessed apneic events and hypersomnia . He was set  Up for a sleep study that showed mild OSA with AHI at 11. We discussed his test results and treatment options with weight loss , oral appliance and  CPAP along w/ pt education on OSA . He was accompanied by his sister. He would like to proceed with CPAP.  He denies chest pain .edema or orthopnea.    Past Medical History:  Diagnosis Date  . ACHALASIA   . ANEMIA-NOS   . CAD (coronary artery disease), 3 vessel significant disease.    . CEREBROVASCULAR ACCIDENT, HX OF   . CKD (chronic kidney disease) stage 4, GFR 15-29 ml/min (HCC)   . Constipation   . DEGENERATIVE JOINT DISEASE, SHOULDER    knee  . DEPRESSION    2006- not depressed any longer  . Family history of adverse reaction to anesthesia    Mother, hard to awaken  . GERD    no meds required  . Gout    takes Uloric daily  . H/O hiatal hernia   . Headache(784.0)   . Heart attack   . History of colon polyps   . History of diastolic dysfunction, grade 2 by echo   . HYPERLIPIDEMIA    takes crestor daily  . Hyperlipidemia   . HYPERTENSION    takes Norvasc,Catapress,Hydralazine,Imdur,and Metoprolol daily  . Impaired glucose tolerance   . Insomnia    takes Ambien nightly  . NSTEMI (non-ST elevated myocardial infarction), 06/30/11 07/01/2011  . RENAL CALCULUS, HX OF   . RENAL INSUFFICIENCY    12/2013- TThSat  . S/P coronary artery stent placement, PTCA/DES Resolute to mid-LAD via the LIMA graft, and PTCA of apical 95% stenosis 07/05/11 07/04/2011  . S/P PTCA (percutaneous  transluminal coronary angioplasty), 06/30/11 07/01/2011  . Seizures (HCC)    INSULIN INDUCED  . Sleep apnea    SLEEP STUDY IN [place] , NO MACHINE YET  1 YEAR  . Stroke (HCC)    93/2005/2006/2007;left sided weakness.  1993 12/2013   Current Outpatient Prescriptions on File Prior to Visit  Medication Sig Dispense Refill  . acetaminophen (TYLENOL) 500 MG tablet Take 1,000 mg by mouth every 6 (six) hours as needed (pain).    Marland Kitchen amLODipine (NORVASC) 5 MG tablet Take 1 tablet (5 mg total) by mouth daily. 90 tablet 3  . aspirin EC 81 MG EC tablet Take 1 tablet (81 mg total) by mouth daily.    Marland Kitchen atorvastatin (LIPITOR) 20 MG tablet Take 1 tablet (20 mg total) by mouth daily. 30 tablet 1  . cinacalcet (SENSIPAR) 30 MG tablet Take 60 mg by mouth at bedtime. Reported on 07/05/2015    . clopidogrel (PLAVIX) 75 MG tablet Take 1 tablet (75 mg total) by mouth daily. 90 tablet 1  . febuxostat (ULORIC) 40 MG tablet Take 40 mg by mouth daily.     . isosorbide mononitrate (IMDUR) 60 MG 24 hr tablet Take 1/2 tablet twice daily 90 tablet  3  . lanthanum ([name]) 1000 MG chewable tablet Chew 2 tablets (2,000 mg total) by mouth 3 (three) times daily with meals. Yearly physical due in July must see MD for refills 540 tablet 0  . lidocaine-prilocaine (EMLA) cream Apply 1 application topically as needed. Apply as needed for dialysis site    . metoprolol (LOPRESSOR) 50 MG tablet Take 1 tablet (50 mg total) by mouth 2 (two) times daily. 180 tablet 3  . NITROSTAT 0.4 MG SL tablet PLACE 1 TABLET UNDER TONGUE AS NEEDED FOR CHEST PAIN EVERY 5 MINUTES (MAX 3 DOSES) 25 tablet 4  . terazosin (HYTRIN) 10 MG capsule TAKE 1 CAPSULE BY MOUTH DAILY AT BEDTIME 90 capsule 3  . ticagrelor (BRILINTA) 90 MG TABS tablet Take 90 mg by mouth 2 (two) times daily.    Marland Kitchen. zolpidem (AMBIEN) 10 MG tablet Take 1 tablet (10 mg total) by mouth at bedtime as needed for sleep. Please make return office visit for further refills 90 tablet 1   No current  facility-administered medications on file prior to visit.       Review of Systems Constitutional:   No  weight loss, night sweats,  Fevers, chills, fatigue, or  lassitude.  HEENT:   No headaches,  Difficulty swallowing,  Tooth/dental problems, or  Sore throat,                No sneezing, itching, ear ache, nasal congestion, post nasal drip,   CV:  No chest pain,  Orthopnea, PND, swelling in lower extremities, anasarca, dizziness, palpitations, syncope.   GI  No heartburn, indigestion, abdominal pain, nausea, vomiting, diarrhea, change in bowel habits, loss of appetite, bloody stools.   Resp: No shortness of breath with exertion or at rest.  No excess mucus, no productive cough,  No non-productive cough,  No coughing up of blood.  No change in color of mucus.  No wheezing.  No chest wall deformity  Skin: no rash or lesions.  GU: no dysuria, change in color of urine, no urgency or frequency.  No flank pain, no hematuria   MS:  No joint pain or swelling.  No decreased range of motion.  No back pain.  Psych:  No change in mood or affect. No depression or anxiety.  No memory loss.         Objective:   Physical Exam Vitals:   01/29/16 1054  BP: 120/76  Pulse: 65  SpO2: 97%  Weight: 218 lb 9.6 oz (99.2 kg)  Height: 6' (1.829 m)  Body mass index is 29.65 kg/m.   GEN: A/Ox3; pleasant , NAD, well nourished    HEENT:  [place]/AT,  EACs-clear, TMs-wnl, NOSE-clear, THROAT-clear, no lesions, no postnasal drip or exudate noted. Class 2-3 MP airway   NECK:  Supple w/ fair ROM; no JVD; normal carotid impulses w/o bruits; no thyromegaly or nodules palpated; no lymphadenopathy.    RESP  Clear  P & A; w/o, wheezes/ rales/ or rhonchi. no accessory muscle use, no dullness to percussion  CARD:  RRR, no m/r/g  , no peripheral edema, pulses intact, no cyanosis or clubbing.  GI:   Soft & nt; nml bowel sounds; no organomegaly or masses detected.   Musco: Warm bil, no deformities or joint  swelling noted.   Neuro: alert, no focal deficits noted.    Skin: Warm, no lesions or rashes  .Samanthia Howland NP-C  [facility]  01/29/2016        Assessment &  Plan:

## 2016-01-29 NOTE — Assessment & Plan Note (Signed)
Mild OSA  Pt education given   Plan  Patient Instructions  Begin CPAP At bedtime  Goal is to wear at least 4-6hr each night  Work on weight loss.  Do not drive if sleepy .  Follow up with Dr. Christene Slates in 6 weeks with CPAP download after starting .

## 2016-01-30 DIAGNOSIS — N186 End stage renal disease: Secondary | ICD-10-CM | POA: Diagnosis not present

## 2016-01-30 DIAGNOSIS — N2581 Secondary hyperparathyroidism of renal origin: Secondary | ICD-10-CM | POA: Diagnosis not present

## 2016-01-30 DIAGNOSIS — D509 Iron deficiency anemia, unspecified: Secondary | ICD-10-CM | POA: Diagnosis not present

## 2016-01-30 DIAGNOSIS — D631 Anemia in chronic kidney disease: Secondary | ICD-10-CM | POA: Diagnosis not present

## 2016-02-01 DIAGNOSIS — D631 Anemia in chronic kidney disease: Secondary | ICD-10-CM | POA: Diagnosis not present

## 2016-02-01 DIAGNOSIS — N186 End stage renal disease: Secondary | ICD-10-CM | POA: Diagnosis not present

## 2016-02-01 DIAGNOSIS — D509 Iron deficiency anemia, unspecified: Secondary | ICD-10-CM | POA: Diagnosis not present

## 2016-02-01 DIAGNOSIS — N2581 Secondary hyperparathyroidism of renal origin: Secondary | ICD-10-CM | POA: Diagnosis not present

## 2016-02-04 DIAGNOSIS — N186 End stage renal disease: Secondary | ICD-10-CM | POA: Diagnosis not present

## 2016-02-04 DIAGNOSIS — N2581 Secondary hyperparathyroidism of renal origin: Secondary | ICD-10-CM | POA: Diagnosis not present

## 2016-02-04 DIAGNOSIS — Z992 Dependence on renal dialysis: Secondary | ICD-10-CM | POA: Diagnosis not present

## 2016-02-05 ENCOUNTER — Ambulatory Visit: Payer: Medicare Other | Admitting: Internal Medicine

## 2016-02-06 DIAGNOSIS — N2581 Secondary hyperparathyroidism of renal origin: Secondary | ICD-10-CM | POA: Diagnosis not present

## 2016-02-06 DIAGNOSIS — N186 End stage renal disease: Secondary | ICD-10-CM | POA: Diagnosis not present

## 2016-02-06 DIAGNOSIS — Z992 Dependence on renal dialysis: Secondary | ICD-10-CM | POA: Diagnosis not present

## 2016-02-08 DIAGNOSIS — Z992 Dependence on renal dialysis: Secondary | ICD-10-CM | POA: Diagnosis not present

## 2016-02-08 DIAGNOSIS — N186 End stage renal disease: Secondary | ICD-10-CM | POA: Diagnosis not present

## 2016-02-09 DIAGNOSIS — N186 End stage renal disease: Secondary | ICD-10-CM | POA: Diagnosis not present

## 2016-02-09 DIAGNOSIS — N2581 Secondary hyperparathyroidism of renal origin: Secondary | ICD-10-CM | POA: Diagnosis not present

## 2016-02-11 DIAGNOSIS — Z992 Dependence on renal dialysis: Secondary | ICD-10-CM | POA: Diagnosis not present

## 2016-02-11 DIAGNOSIS — N186 End stage renal disease: Secondary | ICD-10-CM | POA: Diagnosis not present

## 2016-02-12 ENCOUNTER — Ambulatory Visit: Payer: Medicare Other | Admitting: Neurology

## 2016-02-12 DIAGNOSIS — N2581 Secondary hyperparathyroidism of renal origin: Secondary | ICD-10-CM | POA: Diagnosis not present

## 2016-02-12 DIAGNOSIS — N186 End stage renal disease: Secondary | ICD-10-CM | POA: Diagnosis not present

## 2016-02-13 DIAGNOSIS — Z992 Dependence on renal dialysis: Secondary | ICD-10-CM | POA: Diagnosis not present

## 2016-02-13 DIAGNOSIS — N186 End stage renal disease: Secondary | ICD-10-CM | POA: Diagnosis not present

## 2016-02-14 DIAGNOSIS — Z992 Dependence on renal dialysis: Secondary | ICD-10-CM | POA: Diagnosis not present

## 2016-02-14 DIAGNOSIS — N186 End stage renal disease: Secondary | ICD-10-CM | POA: Diagnosis not present

## 2016-02-14 DIAGNOSIS — N2581 Secondary hyperparathyroidism of renal origin: Secondary | ICD-10-CM | POA: Diagnosis not present

## 2016-02-14 DIAGNOSIS — I120 Hypertensive chronic kidney disease with stage 5 chronic kidney disease or end stage renal disease: Secondary | ICD-10-CM | POA: Diagnosis not present

## 2016-02-15 DIAGNOSIS — N186 End stage renal disease: Secondary | ICD-10-CM | POA: Diagnosis not present

## 2016-02-15 DIAGNOSIS — Z992 Dependence on renal dialysis: Secondary | ICD-10-CM | POA: Diagnosis not present

## 2016-02-16 DIAGNOSIS — N2581 Secondary hyperparathyroidism of renal origin: Secondary | ICD-10-CM | POA: Diagnosis not present

## 2016-02-16 DIAGNOSIS — N186 End stage renal disease: Secondary | ICD-10-CM | POA: Diagnosis not present

## 2016-02-18 DIAGNOSIS — N186 End stage renal disease: Secondary | ICD-10-CM | POA: Diagnosis not present

## 2016-02-18 DIAGNOSIS — N2581 Secondary hyperparathyroidism of renal origin: Secondary | ICD-10-CM | POA: Diagnosis not present

## 2016-02-19 ENCOUNTER — Encounter: Payer: Self-pay | Admitting: Physician Assistant

## 2016-02-19 ENCOUNTER — Ambulatory Visit (INDEPENDENT_AMBULATORY_CARE_PROVIDER_SITE_OTHER): Payer: Medicare Other | Admitting: Physician Assistant

## 2016-02-19 VITALS — BP 132/80 | HR 78 | Ht 72.0 in | Wt 218.1 lb

## 2016-02-19 DIAGNOSIS — Z1211 Encounter for screening for malignant neoplasm of colon: Secondary | ICD-10-CM | POA: Diagnosis not present

## 2016-02-19 NOTE — Patient Instructions (Signed)
Please call our office and ask for Ladene Allocca @ [phone] once you have information on where you had the colonoscopy and possibly the doctors name.   We can research the internet and hopefully find the information we need.

## 2016-02-19 NOTE — Progress Notes (Signed)
Subjective:    Patient ID: [name], male    DOB: [birth year], 55 y.o.   MRN: [id number]  HPI [name] is a pleasant 55 year old African-American male, new to GI today referred by Dr. Jonny Ruiz for colon cancer screening. Patient is accompanied by his sister. Patient has multiple serious medical issues and is being maintained on Plavix, aspirin and Brilinta. He has had a total of 6 prior CVAs last in October 2015. 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 Patient tells me that he did have a prior colonoscopy, initially he thought this had been in 2015 but his sister believes that this may have been 2012 or so while patient was living in [place]. He recalls being told that he had 4 polyps removed. We do not have those records. Currently patient has no complaints of abdominal discomfort, no problems with his bowels and no complaints of melena or hematochezia. Family history is negative for colon cancer, father did have prostate cancer.   Review of Systems Pertinent positive and negative review of systems were noted in the above HPI section.  All other review of systems was otherwise negative.  Outpatient Encounter Prescriptions as of 02/19/2016  Medication Sig  . acetaminophen (TYLENOL) 500 MG tablet Take 1,000 mg by mouth every 6 (six) hours as needed (pain).  Marland Kitchen amLODipine (NORVASC) 5 MG tablet Take 1 tablet (5 mg total) by mouth daily.  Marland Kitchen aspirin EC 81 MG EC tablet Take 1 tablet (81 mg total) by mouth daily.  Marland Kitchen atorvastatin (LIPITOR) 20 MG tablet Take 1 tablet (20 mg total) by mouth daily.  . cinacalcet (SENSIPAR) 30 MG tablet Take 60 mg by mouth at bedtime. Reported on 07/05/2015  . clopidogrel (PLAVIX) 75 MG tablet Take 1 tablet (75 mg total) by mouth daily.  . febuxostat (ULORIC) 40 MG tablet Take 40 mg by  mouth daily.   . isosorbide mononitrate (IMDUR) 60 MG 24 hr tablet Take 1/2 tablet twice daily  . lanthanum ([name]) 1000 MG chewable tablet Chew 2 tablets (2,000 mg total) by mouth 3 (three) times daily with meals. Yearly physical due in July must see MD for refills  . lidocaine-prilocaine (EMLA) cream Apply 1 application topically as needed. Apply as needed for dialysis site  . metoprolol (LOPRESSOR) 50 MG tablet Take 1 tablet (50 mg total) by mouth 2 (two) times daily.  Marland Kitchen NITROSTAT 0.4 MG SL tablet PLACE 1 TABLET UNDER TONGUE AS NEEDED FOR CHEST PAIN EVERY 5 MINUTES (MAX 3 DOSES)  . terazosin (HYTRIN) 10 MG capsule TAKE 1 CAPSULE BY MOUTH DAILY AT BEDTIME  . ticagrelor (BRILINTA) 90 MG TABS tablet Take 90 mg by mouth 2 (two) times daily.  Marland Kitchen zolpidem (AMBIEN) 10 MG tablet Take 1 tablet (10 mg total) by mouth at bedtime as needed for sleep. Please make return office visit for further refills   No facility-administered encounter medications on file as of 02/19/2016.    Allergies  Allergen Reactions  . Shrimp [Shellfish Allergy] Shortness Of Breath  . Insulins Other (See Comments)    Patient unsure as to the kind of insulin but states that it has previously caused seizures. This occurred in the hospital in 2006; but in most recent hospitalization (Jan 2013) received insulin but did not have reaction  . Atorvastatin Other (See Comments)    weakness  .  Simvastatin Other (See Comments)     abnormal liver tests  . Ultram [Tramadol] Other (See Comments)    Liver enzyme    Patient Active Problem List   Diagnosis Date Noted  . OSA (obstructive sleep apnea) 01/08/2016  . Mild cognitive impairment 12/11/2015  . Sleep disorder breathing 10/31/2015  . Urethritis 07/17/2015  . End stage renal disease on dialysis (HCC) 12/06/2014  . Pulse irregularity 09/13/2014  . History of stroke 04/15/2014  . Nail disease 01/22/2014  . CVD (cerebrovascular disease) 12/29/2013  . Acute ischemic left ACA  stroke (HCC) 12/29/2013  . Abnormality of gait 12/22/2013  . Exertional angina (HCC) 06/24/2013  . Venereal disease, unspecified 11/10/2012  . ESRD on dialysis (HCC) 07/09/2012  . Left-sided weakness 07/02/2012  . Subclavian artery stenosis (HCC) 03/26/2012  . Left leg weakness 02/04/2012  . Itching 02/04/2012  . Mouth abscess 11/11/2011  . Increased prostate specific antigen (PSA) velocity 11/11/2011  . Right ankle pain 09/10/2011  . S/P coronary artery stent placement, PTCA/DES Resolute to mid-LAD via the LIMA graft, and PTCA of apical 95% stenosis 06/2011 07/04/2011  . History of diastolic dysfunction, grade 2 by echo 07/03/2011  . S/P PTCA (percutaneous transluminal coronary angioplasty), 06/30/11 07/01/2011  . NSTEMI (non-ST elevated myocardial infarction), 06/30/11 07/01/2011  . Stable angina: with acute T wave inversion.And unstable angina 06/30/2011  . Impaired glucose tolerance 05/13/2011  . 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 Preventative health care 05/10/2011  . S/P CABG x 4: 04/01/11-(LIMA to LAD, SVG to PL branch of RCA, Sequential SVG to OM1/2) 04/07/2011  . CAD, 3 vessel significant disease at cath this admission. underwent CABG in Jan 2013 03/28/2011  . Gout 03/27/2011  . Anemia 05/22/2008  . PERIPHERAL VASCULAR DISEASE 05/22/2008  . ACHALASIA 05/22/2008  . GERD 05/22/2008  . Hyperlipidemia 10/25/2006  . Depression 10/25/2006  . Essential hypertension 10/25/2006  . HEMORRHOIDS 10/25/2006  . ALLERGIC RHINITIS 10/25/2006  . DEGENERATIVE JOINT DISEASE, SHOULDER 10/25/2006  . Headache(784.0) 10/25/2006  . CVA (cerebral infarction) 10/25/2006  . RENAL CALCULUS, HX OF 10/25/2006   Social History   Social History  . Marital status: Divorced    Spouse name: N/A  . Number of children: 1  . Years of education: Bachelors   Occupational History  . Disabled    Social History Main Topics  . Smoking status: Never Smoker  . Smokeless tobacco: Former NeurosurgeonUser    Types:  Chew    Quit date: 03/18/2003     Comment: chewed tobacco   . Alcohol use 1.2 oz/week    2 Shots of liquor per week     Comment: rarely; beer  . Drug use: No  . Sexual activity: Not Currently    Partners: Male   Other Topics Concern  . Not on file   Social History Narrative   Lives at home alone.   Right-handed.   Occasional caffeine use.    [name]'s family history includes Alzheimer's disease in his mother; Cancer in his father and mother; Heart disease in his father and mother; Hyperlipidemia in his mother and other; Hypertension in his mother and other; Stroke in his other.      Objective:    Vitals:   02/19/16 1338  BP: 132/80  Pulse: 78    Physical Exam well-developed older African-American male in no acute distress, accompanied by family member, blood pressure 132/80 pulse 78, height 6 foot, weight 218, BMI 29.5. HEENT; nontraumatic normocephalic EOMI PERRLA sclera anicteric, Cardiovascular ;regular rate  and rhythm rhythm with S1-S2 soft systolic murmur, Pulmonary; clear bilaterally, Abdomen; soft nontender nondistended bowel sounds are active there is no palpable mass or hepatosplenomegaly, Rectal; exam not done, Extremities ;no clubbing cyanosis or edema skin warm and dry, Neuropsych; mood and affect appropriate, memory is poor       Assessment & Plan:   #34 56 year old African-American male referred for colon cancer screening. Patient relates prior colonoscopy with removal of polyps. This may have been 5-6 years ago and was done in [place]. Patient is currently asymptomatic and is a poor screening candidate due to his comorbidities and need for dual antiplatelet therapy. #2 end-stage renal disease on dialysis #3 coronary artery disease status post MI and CABG 2013 #4 history of ischemic CVAs, patient has had a total of 6 prior CVAs the last in October 2015. He has memory issues as results #5 peripheral vascular disease #6 obstructive sleep apnea  Plan; patient  will sign a release, and he and his sister will obtain the physician's name who did his previous colonoscopy so that we may get those records.  He may not be due for follow-up colonoscopy. In any event he is a poor candidate for screening given above multitude of comorbidities  Addendum; after reviewing patient's chart further after patient had left the office Dr. Jonny Ruiz has mentioned in one of his notes that patient may have achalasia. He did have prior barium swallow with Dr. Leone Payor in 2007, there was no normal motility but spasm versus stricture at the GE junction. He has not had prior EGD. The patient, and his sister did not relate to me today that he has any issues with swallowing.   [name] S Robertt Buda PA-C 02/19/2016   Cc: Corwin Levins, MD

## 2016-02-20 DIAGNOSIS — N186 End stage renal disease: Secondary | ICD-10-CM | POA: Diagnosis not present

## 2016-02-20 DIAGNOSIS — N2581 Secondary hyperparathyroidism of renal origin: Secondary | ICD-10-CM | POA: Diagnosis not present

## 2016-02-23 NOTE — Progress Notes (Signed)
Agree with excellent evaluation by Ms. Esterwood. His co-morbidities make his life expectancy seem limited to < 10 yrs from now and also at higher risk of peri-procedural complications such that I would argue against routine screening for colon cancer and evaluate signs and symptoms only.  Iva Boop, MD, Clement[facility] Graham

## 2016-02-25 ENCOUNTER — Telehealth: Payer: Self-pay | Admitting: Physician Assistant

## 2016-02-25 DIAGNOSIS — N186 End stage renal disease: Secondary | ICD-10-CM | POA: Diagnosis not present

## 2016-02-25 DIAGNOSIS — N2581 Secondary hyperparathyroidism of renal origin: Secondary | ICD-10-CM | POA: Diagnosis not present

## 2016-02-25 NOTE — Telephone Encounter (Signed)
Looking for fax to come in for this patient. Records from outside source per the sister of the patient.

## 2016-02-27 DIAGNOSIS — N2581 Secondary hyperparathyroidism of renal origin: Secondary | ICD-10-CM | POA: Diagnosis not present

## 2016-02-27 DIAGNOSIS — N186 End stage renal disease: Secondary | ICD-10-CM | POA: Diagnosis not present

## 2016-02-28 ENCOUNTER — Encounter (HOSPITAL_BASED_OUTPATIENT_CLINIC_OR_DEPARTMENT_OTHER): Payer: Medicare Other

## 2016-02-29 DIAGNOSIS — N186 End stage renal disease: Secondary | ICD-10-CM | POA: Diagnosis not present

## 2016-02-29 DIAGNOSIS — N2581 Secondary hyperparathyroidism of renal origin: Secondary | ICD-10-CM | POA: Diagnosis not present

## 2016-03-03 DIAGNOSIS — N2581 Secondary hyperparathyroidism of renal origin: Secondary | ICD-10-CM | POA: Diagnosis not present

## 2016-03-03 DIAGNOSIS — N186 End stage renal disease: Secondary | ICD-10-CM | POA: Diagnosis not present

## 2016-03-05 DIAGNOSIS — N2581 Secondary hyperparathyroidism of renal origin: Secondary | ICD-10-CM | POA: Diagnosis not present

## 2016-03-05 DIAGNOSIS — N186 End stage renal disease: Secondary | ICD-10-CM | POA: Diagnosis not present

## 2016-03-07 DIAGNOSIS — N186 End stage renal disease: Secondary | ICD-10-CM | POA: Diagnosis not present

## 2016-03-07 DIAGNOSIS — N2581 Secondary hyperparathyroidism of renal origin: Secondary | ICD-10-CM | POA: Diagnosis not present

## 2016-03-09 DIAGNOSIS — N2581 Secondary hyperparathyroidism of renal origin: Secondary | ICD-10-CM | POA: Diagnosis not present

## 2016-03-09 DIAGNOSIS — N186 End stage renal disease: Secondary | ICD-10-CM | POA: Diagnosis not present

## 2016-03-12 DIAGNOSIS — N186 End stage renal disease: Secondary | ICD-10-CM | POA: Diagnosis not present

## 2016-03-12 DIAGNOSIS — N2581 Secondary hyperparathyroidism of renal origin: Secondary | ICD-10-CM | POA: Diagnosis not present

## 2016-03-14 DIAGNOSIS — N2581 Secondary hyperparathyroidism of renal origin: Secondary | ICD-10-CM | POA: Diagnosis not present

## 2016-03-14 DIAGNOSIS — N186 End stage renal disease: Secondary | ICD-10-CM | POA: Diagnosis not present

## 2016-03-16 DIAGNOSIS — I120 Hypertensive chronic kidney disease with stage 5 chronic kidney disease or end stage renal disease: Secondary | ICD-10-CM | POA: Diagnosis not present

## 2016-03-16 DIAGNOSIS — N2581 Secondary hyperparathyroidism of renal origin: Secondary | ICD-10-CM | POA: Diagnosis not present

## 2016-03-16 DIAGNOSIS — N186 End stage renal disease: Secondary | ICD-10-CM | POA: Diagnosis not present

## 2016-03-16 DIAGNOSIS — Z992 Dependence on renal dialysis: Secondary | ICD-10-CM | POA: Diagnosis not present

## 2016-03-19 DIAGNOSIS — D631 Anemia in chronic kidney disease: Secondary | ICD-10-CM | POA: Diagnosis not present

## 2016-03-19 DIAGNOSIS — D509 Iron deficiency anemia, unspecified: Secondary | ICD-10-CM | POA: Diagnosis not present

## 2016-03-19 DIAGNOSIS — Z992 Dependence on renal dialysis: Secondary | ICD-10-CM | POA: Diagnosis not present

## 2016-03-19 DIAGNOSIS — N2581 Secondary hyperparathyroidism of renal origin: Secondary | ICD-10-CM | POA: Diagnosis not present

## 2016-03-19 DIAGNOSIS — N186 End stage renal disease: Secondary | ICD-10-CM | POA: Diagnosis not present

## 2016-03-20 ENCOUNTER — Encounter: Payer: Self-pay | Admitting: Neurology

## 2016-03-20 ENCOUNTER — Other Ambulatory Visit: Payer: Self-pay | Admitting: *Deleted

## 2016-03-20 ENCOUNTER — Ambulatory Visit (INDEPENDENT_AMBULATORY_CARE_PROVIDER_SITE_OTHER): Payer: Medicare Other | Admitting: Neurology

## 2016-03-20 VITALS — BP 169/92 | HR 81 | Ht 72.0 in | Wt 223.0 lb

## 2016-03-20 DIAGNOSIS — R4189 Other symptoms and signs involving cognitive functions and awareness: Secondary | ICD-10-CM | POA: Diagnosis not present

## 2016-03-20 DIAGNOSIS — I63522 Cerebral infarction due to unspecified occlusion or stenosis of left anterior cerebral artery: Secondary | ICD-10-CM | POA: Diagnosis not present

## 2016-03-20 DIAGNOSIS — I679 Cerebrovascular disease, unspecified: Secondary | ICD-10-CM

## 2016-03-20 MED ORDER — MEMANTINE HCL 10 MG PO TABS
10.0000 mg | ORAL_TABLET | Freq: Two times a day (BID) | ORAL | 11 refills | Status: DC
Start: 2016-03-20 — End: 2017-05-12

## 2016-03-20 NOTE — Progress Notes (Signed)
PATIENT: [name] DOB: [birth year]  Chief Complaint  Patient presents with  . Memory Loss    MMSE 30/30 - 17 animals.  He is here with his sister, [name].  Says he has good and bad days with his memory.  He would like to review his EEG.  States he was never contacted to schedule his MRI but is agreeable to the scan.      HISTORICAL  [name] is a 56 years old right-handed male, accompanied by his sister [name], seen in refer by his primary care physician Dr. Cathlean Cower for evaluation of memory loss on December 11 2015,  I have seen him previously in November 2015  He had past medical history of poorly controlled diabetes, end-stage renal disease, started dialysis in August 2015, he received dialysis in Tuesday, Thursday, Saturday, through the left upper extremity AV fistula.  He had a history of  Stroke, MRI of the brain in November 2013 showed chronic ischemic changes in periventricular white matters, chronic hemorrhagic infarction in the left putamen, anterior limb of internal capsule, chronic hemorrhagic infarction of right temporoparietal lobe. Chronic hemorrhagic infarction in the left paracentral pontine has progressed, basal ganglion, thalamus, chronic hemorrhagic infarction in the right frontal lobe.  Echocardiogram in 2013 showed mild concentric left ventricular hypertrophy, ejection fraction was 35%, moderate to severe septal hypokinesia, moderate apical wall hypokinesis.  He was admitted to the hospital in October 2015 for acute onset of left-sided weakness,,MRI of the brain showed Acute infarct head of the caudate on the left with multiple lacunar stroke at BG and pons. Infarct felt secondary to severer atherosclerotic disease aggravated by hypotension. His weakness on the left did not correlate with the acute stroke, but more consistent with the recrudescence of his old lacunar stroke. New acute stroke likely silent stroke caused by hypotension. MRA Severe  intracranial atherosclerotic disease, especially b/l MCAs. Carotid Doppler Preliminary report: Bilateral: 1-39% ICA stenosis. Vertebral artery flow is antegrade. 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  Patient tends to minimize his difficulty, sister reported that patient has become forgetful, tends to repeat himself, overspend, forgot to pay his bill, his light water has been cut off few times, his sister want his cognitive function to be fully evaluated, to facilitate the process of power of attorney assignment, his sister is now managing his finances, he also is much less motivated, does not cook, cleaning, no longer keep up his exercise routine, easily agitated, is depressed  I reviewed the laboratory evaluation in August 2017, LDL was elevated 166, cholesterol was 274, triglycerides right was 378, A1c was 4.9, PSA was elevated 5.82, normal TSH 1.15, normal CBC, with hemoglobin of 11 point 9, normal liver functional tests, BMP showed mildly decreased potassium 3.4, elevated creatinine 5.57, glucose was 104, RPR was negative  We have personally reviewed his most recent MRI of the brain in 2015, acute infarction in the left head of caudate, moderate supratentorium small vessel disease, severe intracranial atherosclerotic disease  Laboratory evaluation in August 2017, elevated LDL 166, elevated total cholesterol 274, triglycerides 378, A1c 4.9,  negative hepatitis C antibody, normal TSH 1.15  UPDATE Jan 4th 2018: He had neuropsychiatric testing in Nov 2017 by Dr. Dimas Chyle, [facility], he drove to  [facility] himself, rest of times, he stayed at home, with a roommate, he felt lost,  Watches TV, goes to sleep,   " Results of this evaluation revealed deficits in auditory attention and working memory, verbal fluency, mental flexibility and set-shifting and deductive reasoning. Processing speed was variable, impaired at times. In addition to cognitive deficits demonstrated on objective testing, there is evidence that the patient's cognitive deficits are interfering with his ability to perform instrumental ADLs such as management of his finances and appointments. 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  The patient's history of multiple CVAs, with cognitive decline following each one, in combination with his cognitive profile reflecting frontal-subcortical dysfunction, are all in line with vascular etiology.  Given the significant number of strokes and other vascular risk factors, he is fortunate to maintain a number of cognitive strengths, including learning and memory, language comprehension, and naming abilities. Also, at this time, there is no evidence to suggest a superimposed early onset Alzheimer's disease. " EEG was normal in October 2017.  REVIEW OF SYSTEMS: Full 14 system review of systems performed and notable only for memory loss  ALLERGIES: Allergies  Allergen Reactions  . Shrimp [Shellfish Allergy] Shortness Of Breath  . Insulins Other (See Comments)    Patient unsure as to the kind of insulin but states that it has previously caused seizures. This occurred in the hospital in 2006; but in most recent hospitalization (Jan 2013) received insulin but did not have reaction  . Atorvastatin Other (See Comments)    weakness  . Simvastatin Other (See Comments)     abnormal liver tests  . Ultram [Tramadol] Other (See Comments)    Liver enzyme     HOME MEDICATIONS: Current Outpatient Prescriptions  Medication Sig Dispense Refill  . acetaminophen (TYLENOL) 500 MG  tablet Take 1,000 mg by mouth every 6 (six) hours as needed (pain).    Marland Kitchen amLODipine (NORVASC) 5 MG tablet Take 1 tablet (5 mg total) by mouth daily. 90 tablet 3  . aspirin EC 81 MG EC tablet Take 1 tablet (81 mg total) by mouth daily.    Marland Kitchen atorvastatin (LIPITOR) 20 MG tablet Take 1 tablet (20 mg total) by mouth daily. 30 tablet 1  . cinacalcet (SENSIPAR) 30 MG tablet Take 60 mg by mouth at bedtime. Reported on 07/05/2015    . clopidogrel (PLAVIX) 75 MG tablet Take 1 tablet (75 mg total) by mouth daily. 90 tablet 1  . febuxostat (ULORIC) 40 MG tablet Take 40 mg by mouth daily.     . isosorbide mononitrate (IMDUR) 60 MG 24 hr tablet Take 1/2 tablet twice daily 90 tablet 3  . lanthanum ([name]) 1000 MG chewable tablet Chew 2 tablets (2,000 mg total) by mouth 3 (three) times daily with meals. Yearly physical due in July must see MD for refills 540 tablet 0  . lidocaine-prilocaine (EMLA) cream Apply 1 application topically as needed. Apply as needed for dialysis site    . metoprolol (LOPRESSOR) 50 MG tablet Take 1 tablet (50 mg total) by mouth 2 (two) times daily. 180 tablet 3  . NITROSTAT 0.4 MG SL tablet PLACE 1 TABLET UNDER TONGUE AS NEEDED FOR CHEST PAIN EVERY 5 MINUTES (MAX 3 DOSES) 25 tablet 4  . terazosin (HYTRIN) 10 MG capsule TAKE  1 CAPSULE BY MOUTH DAILY AT BEDTIME 90 capsule 3  . ticagrelor (BRILINTA) 90 MG TABS tablet Take 90 mg by mouth 2 (two) times daily.    Marland Kitchen zolpidem (AMBIEN) 10 MG tablet Take 1 tablet (10 mg total) by mouth at bedtime as needed for sleep. Please make return office visit for further refills 90 tablet 1   No current facility-administered medications for this visit.     PAST MEDICAL HISTORY: Past Medical History:  Diagnosis Date  . ACHALASIA   . ANEMIA-NOS   . CAD (coronary artery disease), 3 vessel significant disease.    . CEREBROVASCULAR ACCIDENT, HX OF   . CKD (chronic kidney disease) stage 4, GFR 15-29 ml/min (HCC)   . Constipation   . DEGENERATIVE JOINT  DISEASE, SHOULDER    knee  . DEPRESSION    2006- not depressed any longer  . Family history of adverse reaction to anesthesia    Mother, hard to awaken  . GERD    no meds required  . Gout    takes Uloric daily  . H/O hiatal hernia   . Headache(784.0)   . Heart attack   . History of colon polyps   . History of diastolic dysfunction, grade 2 by echo   . HYPERLIPIDEMIA    takes crestor daily  . Hyperlipidemia   . HYPERTENSION    takes Norvasc,Catapress,Hydralazine,Imdur,and Metoprolol daily  . Impaired glucose tolerance   . Insomnia    takes Ambien nightly  . NSTEMI (non-ST elevated myocardial infarction), 06/30/11 07/01/2011  . RENAL CALCULUS, HX OF   . RENAL INSUFFICIENCY    12/2013- TThSat  . S/P coronary artery stent placement, PTCA/DES Resolute to mid-LAD via the LIMA graft, and PTCA of apical 95% stenosis 07/05/11 07/04/2011  . S/P PTCA (percutaneous transluminal coronary angioplasty), 06/30/11 07/01/2011  . Seizures (HCC)    INSULIN INDUCED  . Sleep apnea    SLEEP STUDY IN [place] , NO MACHINE YET  1 YEAR  . Stroke (HCC)    93/2005/2006/2007;left sided weakness.  1993 12/2013    PAST SURGICAL HISTORY: Past Surgical History:  Procedure Laterality Date  . ARCH AORTOGRAM N/A 02/19/2012   Procedure: ARCH AORTOGRAM;  Surgeon: Fransisco Hertz, MD;  Location: [facility] CATH LAB;  Service: Cardiovascular;  Laterality: N/A;  . AV FISTULA PLACEMENT  12/02/2011   Procedure: ARTERIOVENOUS (AV) FISTULA CREATION;  Surgeon: Fransisco Hertz, MD;  Location: [facility] OR;  Service: Vascular;  Laterality: Left;  Ultrasound Guided  . AV FISTULA PLACEMENT Left 06/08/2012   Procedure: ARTERIOVENOUS (AV) FISTULA CREATION;  Surgeon: Fransisco Hertz, MD;  Location: [facility] OR;  Service: Vascular;  Laterality: Left;  . BASCILIC VEIN TRANSPOSITION Left 09/06/2014   Procedure: FIRST STAGE BASILIC VEIN TRANSPOSITION ;  Surgeon: Fransisco Hertz, MD;  Location: [facility] OR;  Service: Vascular;  Laterality: Left;  . BASCILIC VEIN  TRANSPOSITION Left 12/06/2014   Procedure: SECOND STAGE BRACHIAL VEIN TRANSPOSITION ;  Surgeon: Fransisco Hertz, MD;  Location: [facility] OR;  Service: Vascular;  Laterality: Left;  . CARDIAC CATHETERIZATION    . COLONOSCOPY    . CORONARY ANGIOPLASTY     06/30/11   AT [facility]  . CORONARY ARTERY BYPASS GRAFT  04/01/2011   Procedure: CORONARY ARTERY BYPASS GRAFTING (CABG);  Surgeon: Kathlee Nations Suann Larry, MD;  Location: [facility] OR;  Service: Open Heart Surgery;  Laterality: N/A;  . HERNIA REPAIR  2013  . INSERTION OF DIALYSIS CATHETER  04/01/2011   Procedure: INSERTION OF DIALYSIS  CATHETER;  Surgeon: Hinda Lenis, MD;  Location: [place];  Service: Vascular;  Laterality: N/A;  . KNEE SURGERY Right 01/07/2011   arthroscopy  . LEFT HEART CATHETERIZATION WITH CORONARY ANGIOGRAM N/A 03/28/2011   Procedure: LEFT HEART CATHETERIZATION WITH CORONARY ANGIOGRAM;  Surgeon: Troy Sine, MD;  Location: [facility] CATH LAB;  Service: Cardiovascular;  Laterality: N/A;  pending creatnine  . LEFT HEART CATHETERIZATION WITH CORONARY/GRAFT ANGIOGRAM N/A 06/30/2011   Procedure: LEFT HEART CATHETERIZATION WITH Beatrix Fetters;  Surgeon: Sanda Klein, MD;  Location: [place] CATH LAB;  Service: Cardiovascular;  Laterality: N/A;  . LIGATION OF ARTERIOVENOUS  FISTULA Left 06/08/2012   Procedure: LIGATION OF ARTERIOVENOUS  FISTULA;  Surgeon: Conrad Arkoma, MD;  Location: [place];  Service: Vascular;  Laterality: Left;  . NM MYOCAR PERF EJECTION FRACTION  06/25/2011   There is evidence of mild ischemia in the basal inferolateral and mid inferolateral regions. (Extent 7 %) The post-stress ejection fraction is 44%. There is mild hypocontractility in the distal inferoapical segment. baseline T wave inversion is noted in leads I, avL, II, V2. This is a low risk scan. The present perfusion study suggests significant myocardial salvage with mild residual ischemia .  Marland Kitchen PERCUTANEOUS CORONARY INTERVENTION-BALLOON ONLY  06/30/2011   Procedure: PERCUTANEOUS  CORONARY INTERVENTION-BALLOON ONLY;  Surgeon: Sanda Klein, MD;  Location: [place] CATH LAB;  Service: Cardiovascular;;  . PERCUTANEOUS CORONARY STENT INTERVENTION (PCI-S) N/A 07/04/2011   Procedure: PERCUTANEOUS CORONARY STENT INTERVENTION (PCI-S);  Surgeon: Troy Sine, MD;  Location: [facility] CATH LAB;  Service: Cardiovascular;  Laterality: N/A;  . REVISION OF ARTERIOVENOUS GORETEX GRAFT Left 12/06/2014   Procedure: REVISION OF ARTERIOVENOUS GORETEX GRAFT USING 26mx10cm;  Surgeon: BConrad Satsuma MD;  [place]  Service: Vascular;  Laterality: Left;  . STOMACH SURGERY  2009   achalasia  . SUBCLAVIAN STENT PLACEMENT  12/20/2011  . TONSILLECTOMY      FAMILY HISTORY: Family History  Problem Relation Age of Onset  . Cancer Mother   . Heart disease Mother   . Hyperlipidemia Mother   . Hypertension Mother   . Alzheimer's disease Mother   . Cancer Father   . Heart disease Father   . Hypertension Other   . Stroke Other   . Hyperlipidemia Other     SOCIAL HISTORY:  Social History   Social History  . Marital status: Divorced    Spouse name: N/A  . Number of children: 1  . Years of education: Bachelors   Occupational History  . Disabled    Social History Main Topics  . Smoking status: Never Smoker  . Smokeless tobacco: Former [employer] developer   Types: Chew    Quit date: 03/18/2003     Comment: chewed tobacco   . Alcohol use 1.2 oz/week    2 Shots of liquor per week     Comment: rarely; beer  . Drug use: No  . Sexual activity: Not Currently    Partners: Male   Other Topics Concern  . Not on file   Social History Narrative   Lives at home alone.   Right-handed.   Occasional caffeine use.     PHYSICAL EXAM   Vitals:   03/20/16 1253  BP: (!) 169/92  Pulse: 81  Weight: 223 lb (101.2 kg)  Height: 6' (1.829 m)    Not recorded      Body mass index is 30.24 kg/m.  PHYSICAL EXAMNIATION:  Gen: NAD, conversant, well nourised, obese, well groomed  Cardiovascular: Regular rate rhythm, no peripheral edema, warm, nontender. Eyes: Conjunctivae clear without exudates or hemorrhage Neck: Supple, no carotid bruise. Pulmonary: Clear to auscultation bilaterally   NEUROLOGICAL EXAM:  MENTAL STATUS: Speech:    Speech is normal; fluent and spontaneous with normal comprehension.  Cognition:Mini-Mental Status Examination 30/30, animal naming 17     Orientation to time, place and person     Normal recent and remote memory     Normal Attention span and concentration     Normal Language, naming, repeating,spontaneous speech     Fund of knowledge   CRANIAL NERVES: CN II: Visual fields are full to confrontation. Fundoscopic exam is normal with sharp discs and no vascular changes. Pupils are round equal and briskly reactive to light. CN III, IV, VI: extraocular movement are normal. No ptosis. CN V: Facial sensation is intact to pinprick in all 3 divisions bilaterally. Corneal responses are intact.  CN VII: Face is symmetric with normal eye closure and smile. CN VIII: Hearing is normal to rubbing fingers CN IX, X: Palate elevates symmetrically. Phonation is normal. CN XI: Head turning and shoulder shrug are intact CN XII: Tongue is midline with normal movements and no atrophy.  MOTOR: There is no pronator drift of out-stretched arms. Muscle bulk and tone are normal. Muscle strength is normal.  REFLEXES: Reflexes are 2+ and symmetric at the biceps, triceps, knees, and ankles. Plantar responses are flexor.  SENSORY: Intact to light touch, pinprick, positional sensation and vibratory sensation are intact in fingers and toes.  COORDINATION: Rapid alternating movements and fine finger movements are intact. There is no dysmetria on finger-to-nose and heel-knee-shin.    GAIT/STANCE: Posture is normal. Gait is steady with normal steps, base, arm swing, and turning. Heel and toe walking are normal. Tandem gait is normal.  Romberg is  absent.   DIAGNOSTIC DATA (LABS, IMAGING, TESTING) - I reviewed patient records, labs, notes, testing and imaging myself where available.   ASSESSMENT AND PLAN  [name] is a 56 y.o. male   Cognitive impairment Multifactorial, stroke, depression also contribute EEG Started Namenda 10 mg twice a day I also suggested to him and his sister to finish power of attorney paperwork Complicated past medical history, including stroke, and end-stage renal disease on hemodialysis   [name], M.D. Ph.D.  [facility] [address], [place], [place] [address] Ph: [phone] Fax: [phone]  CC: Biagio Borg, MD

## 2016-03-21 DIAGNOSIS — Z992 Dependence on renal dialysis: Secondary | ICD-10-CM | POA: Diagnosis not present

## 2016-03-21 DIAGNOSIS — N186 End stage renal disease: Secondary | ICD-10-CM | POA: Diagnosis not present

## 2016-03-21 DIAGNOSIS — D631 Anemia in chronic kidney disease: Secondary | ICD-10-CM | POA: Diagnosis not present

## 2016-03-21 DIAGNOSIS — N2581 Secondary hyperparathyroidism of renal origin: Secondary | ICD-10-CM | POA: Diagnosis not present

## 2016-03-21 DIAGNOSIS — D509 Iron deficiency anemia, unspecified: Secondary | ICD-10-CM | POA: Diagnosis not present

## 2016-03-24 DIAGNOSIS — D509 Iron deficiency anemia, unspecified: Secondary | ICD-10-CM | POA: Diagnosis not present

## 2016-03-24 DIAGNOSIS — D631 Anemia in chronic kidney disease: Secondary | ICD-10-CM | POA: Diagnosis not present

## 2016-03-24 DIAGNOSIS — Z992 Dependence on renal dialysis: Secondary | ICD-10-CM | POA: Diagnosis not present

## 2016-03-24 DIAGNOSIS — N186 End stage renal disease: Secondary | ICD-10-CM | POA: Diagnosis not present

## 2016-03-24 DIAGNOSIS — N2581 Secondary hyperparathyroidism of renal origin: Secondary | ICD-10-CM | POA: Diagnosis not present

## 2016-03-26 DIAGNOSIS — N186 End stage renal disease: Secondary | ICD-10-CM | POA: Diagnosis not present

## 2016-03-26 DIAGNOSIS — Z992 Dependence on renal dialysis: Secondary | ICD-10-CM | POA: Diagnosis not present

## 2016-03-26 DIAGNOSIS — D509 Iron deficiency anemia, unspecified: Secondary | ICD-10-CM | POA: Diagnosis not present

## 2016-03-26 DIAGNOSIS — N2581 Secondary hyperparathyroidism of renal origin: Secondary | ICD-10-CM | POA: Diagnosis not present

## 2016-03-26 DIAGNOSIS — D631 Anemia in chronic kidney disease: Secondary | ICD-10-CM | POA: Diagnosis not present

## 2016-03-28 DIAGNOSIS — D509 Iron deficiency anemia, unspecified: Secondary | ICD-10-CM | POA: Diagnosis not present

## 2016-03-28 DIAGNOSIS — Z992 Dependence on renal dialysis: Secondary | ICD-10-CM | POA: Diagnosis not present

## 2016-03-28 DIAGNOSIS — N186 End stage renal disease: Secondary | ICD-10-CM | POA: Diagnosis not present

## 2016-03-28 DIAGNOSIS — N2581 Secondary hyperparathyroidism of renal origin: Secondary | ICD-10-CM | POA: Diagnosis not present

## 2016-03-28 DIAGNOSIS — D631 Anemia in chronic kidney disease: Secondary | ICD-10-CM | POA: Diagnosis not present

## 2016-03-31 DIAGNOSIS — D631 Anemia in chronic kidney disease: Secondary | ICD-10-CM | POA: Diagnosis not present

## 2016-03-31 DIAGNOSIS — N2581 Secondary hyperparathyroidism of renal origin: Secondary | ICD-10-CM | POA: Diagnosis not present

## 2016-03-31 DIAGNOSIS — D509 Iron deficiency anemia, unspecified: Secondary | ICD-10-CM | POA: Diagnosis not present

## 2016-03-31 DIAGNOSIS — Z992 Dependence on renal dialysis: Secondary | ICD-10-CM | POA: Diagnosis not present

## 2016-03-31 DIAGNOSIS — N186 End stage renal disease: Secondary | ICD-10-CM | POA: Diagnosis not present

## 2016-04-02 DIAGNOSIS — Z992 Dependence on renal dialysis: Secondary | ICD-10-CM | POA: Diagnosis not present

## 2016-04-02 DIAGNOSIS — N2581 Secondary hyperparathyroidism of renal origin: Secondary | ICD-10-CM | POA: Diagnosis not present

## 2016-04-02 DIAGNOSIS — N186 End stage renal disease: Secondary | ICD-10-CM | POA: Diagnosis not present

## 2016-04-02 DIAGNOSIS — D509 Iron deficiency anemia, unspecified: Secondary | ICD-10-CM | POA: Diagnosis not present

## 2016-04-02 DIAGNOSIS — D631 Anemia in chronic kidney disease: Secondary | ICD-10-CM | POA: Diagnosis not present

## 2016-04-04 DIAGNOSIS — Z992 Dependence on renal dialysis: Secondary | ICD-10-CM | POA: Diagnosis not present

## 2016-04-04 DIAGNOSIS — N2581 Secondary hyperparathyroidism of renal origin: Secondary | ICD-10-CM | POA: Diagnosis not present

## 2016-04-04 DIAGNOSIS — N186 End stage renal disease: Secondary | ICD-10-CM | POA: Diagnosis not present

## 2016-04-04 DIAGNOSIS — D631 Anemia in chronic kidney disease: Secondary | ICD-10-CM | POA: Diagnosis not present

## 2016-04-04 DIAGNOSIS — D509 Iron deficiency anemia, unspecified: Secondary | ICD-10-CM | POA: Diagnosis not present

## 2016-04-07 DIAGNOSIS — D509 Iron deficiency anemia, unspecified: Secondary | ICD-10-CM | POA: Diagnosis not present

## 2016-04-07 DIAGNOSIS — N186 End stage renal disease: Secondary | ICD-10-CM | POA: Diagnosis not present

## 2016-04-07 DIAGNOSIS — N2581 Secondary hyperparathyroidism of renal origin: Secondary | ICD-10-CM | POA: Diagnosis not present

## 2016-04-07 DIAGNOSIS — Z992 Dependence on renal dialysis: Secondary | ICD-10-CM | POA: Diagnosis not present

## 2016-04-07 DIAGNOSIS — D631 Anemia in chronic kidney disease: Secondary | ICD-10-CM | POA: Diagnosis not present

## 2016-04-09 DIAGNOSIS — D631 Anemia in chronic kidney disease: Secondary | ICD-10-CM | POA: Diagnosis not present

## 2016-04-09 DIAGNOSIS — D509 Iron deficiency anemia, unspecified: Secondary | ICD-10-CM | POA: Diagnosis not present

## 2016-04-09 DIAGNOSIS — Z992 Dependence on renal dialysis: Secondary | ICD-10-CM | POA: Diagnosis not present

## 2016-04-09 DIAGNOSIS — N2581 Secondary hyperparathyroidism of renal origin: Secondary | ICD-10-CM | POA: Diagnosis not present

## 2016-04-09 DIAGNOSIS — N186 End stage renal disease: Secondary | ICD-10-CM | POA: Diagnosis not present

## 2016-04-10 DIAGNOSIS — T82858A Stenosis of vascular prosthetic devices, implants and grafts, initial encounter: Secondary | ICD-10-CM | POA: Diagnosis not present

## 2016-04-10 DIAGNOSIS — Z992 Dependence on renal dialysis: Secondary | ICD-10-CM | POA: Diagnosis not present

## 2016-04-10 DIAGNOSIS — N186 End stage renal disease: Secondary | ICD-10-CM | POA: Diagnosis not present

## 2016-04-10 DIAGNOSIS — I871 Compression of vein: Secondary | ICD-10-CM | POA: Diagnosis not present

## 2016-04-12 DIAGNOSIS — D509 Iron deficiency anemia, unspecified: Secondary | ICD-10-CM | POA: Diagnosis not present

## 2016-04-12 DIAGNOSIS — Z992 Dependence on renal dialysis: Secondary | ICD-10-CM | POA: Diagnosis not present

## 2016-04-12 DIAGNOSIS — D631 Anemia in chronic kidney disease: Secondary | ICD-10-CM | POA: Diagnosis not present

## 2016-04-12 DIAGNOSIS — N2581 Secondary hyperparathyroidism of renal origin: Secondary | ICD-10-CM | POA: Diagnosis not present

## 2016-04-12 DIAGNOSIS — N186 End stage renal disease: Secondary | ICD-10-CM | POA: Diagnosis not present

## 2016-04-14 DIAGNOSIS — Z992 Dependence on renal dialysis: Secondary | ICD-10-CM | POA: Diagnosis not present

## 2016-04-14 DIAGNOSIS — N186 End stage renal disease: Secondary | ICD-10-CM | POA: Diagnosis not present

## 2016-04-14 DIAGNOSIS — D509 Iron deficiency anemia, unspecified: Secondary | ICD-10-CM | POA: Diagnosis not present

## 2016-04-14 DIAGNOSIS — N2581 Secondary hyperparathyroidism of renal origin: Secondary | ICD-10-CM | POA: Diagnosis not present

## 2016-04-14 DIAGNOSIS — D631 Anemia in chronic kidney disease: Secondary | ICD-10-CM | POA: Diagnosis not present

## 2016-04-16 DIAGNOSIS — I120 Hypertensive chronic kidney disease with stage 5 chronic kidney disease or end stage renal disease: Secondary | ICD-10-CM | POA: Diagnosis not present

## 2016-04-16 DIAGNOSIS — D631 Anemia in chronic kidney disease: Secondary | ICD-10-CM | POA: Diagnosis not present

## 2016-04-16 DIAGNOSIS — Z992 Dependence on renal dialysis: Secondary | ICD-10-CM | POA: Diagnosis not present

## 2016-04-16 DIAGNOSIS — N2581 Secondary hyperparathyroidism of renal origin: Secondary | ICD-10-CM | POA: Diagnosis not present

## 2016-04-16 DIAGNOSIS — N186 End stage renal disease: Secondary | ICD-10-CM | POA: Diagnosis not present

## 2016-04-16 DIAGNOSIS — D509 Iron deficiency anemia, unspecified: Secondary | ICD-10-CM | POA: Diagnosis not present

## 2016-04-18 DIAGNOSIS — D631 Anemia in chronic kidney disease: Secondary | ICD-10-CM | POA: Diagnosis not present

## 2016-04-18 DIAGNOSIS — N2581 Secondary hyperparathyroidism of renal origin: Secondary | ICD-10-CM | POA: Diagnosis not present

## 2016-04-18 DIAGNOSIS — N186 End stage renal disease: Secondary | ICD-10-CM | POA: Diagnosis not present

## 2016-04-21 DIAGNOSIS — N186 End stage renal disease: Secondary | ICD-10-CM | POA: Diagnosis not present

## 2016-04-21 DIAGNOSIS — N2581 Secondary hyperparathyroidism of renal origin: Secondary | ICD-10-CM | POA: Diagnosis not present

## 2016-04-21 DIAGNOSIS — D631 Anemia in chronic kidney disease: Secondary | ICD-10-CM | POA: Diagnosis not present

## 2016-04-23 DIAGNOSIS — N2581 Secondary hyperparathyroidism of renal origin: Secondary | ICD-10-CM | POA: Diagnosis not present

## 2016-04-23 DIAGNOSIS — N186 End stage renal disease: Secondary | ICD-10-CM | POA: Diagnosis not present

## 2016-04-23 DIAGNOSIS — D631 Anemia in chronic kidney disease: Secondary | ICD-10-CM | POA: Diagnosis not present

## 2016-04-25 DIAGNOSIS — N2581 Secondary hyperparathyroidism of renal origin: Secondary | ICD-10-CM | POA: Diagnosis not present

## 2016-04-25 DIAGNOSIS — D631 Anemia in chronic kidney disease: Secondary | ICD-10-CM | POA: Diagnosis not present

## 2016-04-25 DIAGNOSIS — N186 End stage renal disease: Secondary | ICD-10-CM | POA: Diagnosis not present

## 2016-04-27 ENCOUNTER — Ambulatory Visit
Admission: RE | Admit: 2016-04-27 | Discharge: 2016-04-27 | Disposition: A | Discharge status: Home / Self Care | Payer: Medicare Other | Source: Ambulatory Visit | Attending: Neurology | Admitting: Neurology

## 2016-04-27 DIAGNOSIS — I63522 Cerebral infarction due to unspecified occlusion or stenosis of left anterior cerebral artery: Secondary | ICD-10-CM

## 2016-04-27 DIAGNOSIS — R937 Abnormal findings on diagnostic imaging of other parts of musculoskeletal system: Secondary | ICD-10-CM | POA: Diagnosis not present

## 2016-04-27 DIAGNOSIS — I679 Cerebrovascular disease, unspecified: Secondary | ICD-10-CM | POA: Diagnosis not present

## 2016-04-27 DIAGNOSIS — G3184 Mild cognitive impairment, so stated: Secondary | ICD-10-CM

## 2016-04-27 DIAGNOSIS — I739 Peripheral vascular disease, unspecified: Secondary | ICD-10-CM

## 2016-04-27 DIAGNOSIS — G35 Multiple sclerosis: Secondary | ICD-10-CM | POA: Diagnosis not present

## 2016-04-28 DIAGNOSIS — D631 Anemia in chronic kidney disease: Secondary | ICD-10-CM | POA: Diagnosis not present

## 2016-04-28 DIAGNOSIS — N186 End stage renal disease: Secondary | ICD-10-CM | POA: Diagnosis not present

## 2016-04-28 DIAGNOSIS — N2581 Secondary hyperparathyroidism of renal origin: Secondary | ICD-10-CM | POA: Diagnosis not present

## 2016-04-30 DIAGNOSIS — D631 Anemia in chronic kidney disease: Secondary | ICD-10-CM | POA: Diagnosis not present

## 2016-04-30 DIAGNOSIS — N2581 Secondary hyperparathyroidism of renal origin: Secondary | ICD-10-CM | POA: Diagnosis not present

## 2016-04-30 DIAGNOSIS — N186 End stage renal disease: Secondary | ICD-10-CM | POA: Diagnosis not present

## 2016-05-02 DIAGNOSIS — D631 Anemia in chronic kidney disease: Secondary | ICD-10-CM | POA: Diagnosis not present

## 2016-05-02 DIAGNOSIS — N2581 Secondary hyperparathyroidism of renal origin: Secondary | ICD-10-CM | POA: Diagnosis not present

## 2016-05-02 DIAGNOSIS — N186 End stage renal disease: Secondary | ICD-10-CM | POA: Diagnosis not present

## 2016-05-05 DIAGNOSIS — N2581 Secondary hyperparathyroidism of renal origin: Secondary | ICD-10-CM | POA: Diagnosis not present

## 2016-05-05 DIAGNOSIS — N186 End stage renal disease: Secondary | ICD-10-CM | POA: Diagnosis not present

## 2016-05-05 DIAGNOSIS — D631 Anemia in chronic kidney disease: Secondary | ICD-10-CM | POA: Diagnosis not present

## 2016-05-06 ENCOUNTER — Other Ambulatory Visit (INDEPENDENT_AMBULATORY_CARE_PROVIDER_SITE_OTHER): Payer: Medicare Other

## 2016-05-06 ENCOUNTER — Encounter: Payer: Self-pay | Admitting: Internal Medicine

## 2016-05-06 ENCOUNTER — Ambulatory Visit (INDEPENDENT_AMBULATORY_CARE_PROVIDER_SITE_OTHER): Payer: Medicare Other | Admitting: Internal Medicine

## 2016-05-06 VITALS — BP 138/86 | HR 72 | Temp 97.6°F | Ht 72.0 in | Wt 217.0 lb

## 2016-05-06 DIAGNOSIS — R972 Elevated prostate specific antigen [PSA]: Secondary | ICD-10-CM

## 2016-05-06 DIAGNOSIS — I63522 Cerebral infarction due to unspecified occlusion or stenosis of left anterior cerebral artery: Secondary | ICD-10-CM

## 2016-05-06 DIAGNOSIS — R7302 Impaired glucose tolerance (oral): Secondary | ICD-10-CM

## 2016-05-06 DIAGNOSIS — R7989 Other specified abnormal findings of blood chemistry: Secondary | ICD-10-CM

## 2016-05-06 DIAGNOSIS — E785 Hyperlipidemia, unspecified: Secondary | ICD-10-CM

## 2016-05-06 LAB — LIPID PANEL
Cholesterol: 282 mg/dL — ABNORMAL HIGH (ref 0–200)
HDL: 41.2 mg/dL (ref >39.00)
NonHDL: 240.5
TRIGLYCERIDES: 243 mg/dL — AB (ref 0.0–149.0)
Total CHOL/HDL Ratio: 7
VLDL: 48.6 mg/dL — AB (ref 0.0–40.0)

## 2016-05-06 LAB — HEMOGLOBIN A1C: HEMOGLOBIN A1C: 5.2 % (ref 4.6–6.5)

## 2016-05-06 LAB — LDL CHOLESTEROL, DIRECT: Direct LDL: 178 mg/dL

## 2016-05-06 LAB — PSA: PSA: 4.37 ng/mL — AB (ref 0.10–4.00)

## 2016-05-06 NOTE — Patient Instructions (Signed)

## 2016-05-06 NOTE — Progress Notes (Signed)
Subjective:    Patient ID: [name], male    DOB: 1960-06-18, 55 y.o.   MRN: [id number]  HPI   Here to f/u; overall doing ok,  Pt denies chest pain, increasing sob or doe, wheezing, orthopnea, PND, increased LE swelling, palpitations, dizziness or syncope.  Pt denies new neurological symptoms such as new headache, or facial or extremity weakness or numbness.  Pt denies polydipsia, polyuria, or low sugar episode.   Pt denies new neurological symptoms such as new headache, or facial or extremity weakness or numbness.   Pt states overall good compliance with meds, mostly trying to follow appropriate diet, with wt overall stable,  but little exercise however. Wt Readings from Last 3 Encounters:  05/06/16 217 lb (98.4 kg)  03/20/16 223 lb (101.2 kg)  02/19/16 218 lb 1 oz (98.9 kg)   BP Readings from Last 3 Encounters:  05/06/16 138/86  03/20/16 (!) 169/92  02/19/16 132/80  Denies urinary symptoms such as dysuria, frequency, urgency, flank pain, hematuria or n/v, fever, chills. Past Medical History:  Diagnosis Date  . ACHALASIA   . ANEMIA-NOS   . CAD (coronary artery disease), 3 vessel significant disease.    . CEREBROVASCULAR ACCIDENT, HX OF   . CKD (chronic kidney disease) stage 4, GFR 15-29 ml/min (HCC)   . Constipation   . DEGENERATIVE JOINT DISEASE, SHOULDER    knee  . DEPRESSION    2006- not depressed any longer  . Family history of adverse reaction to anesthesia    Mother, hard to awaken  . GERD    no meds required  . Gout    takes Uloric daily  . H/O hiatal hernia   . Headache(784.0)   . Heart attack   . History of colon polyps   . History of diastolic dysfunction, grade 2 by echo   . HYPERLIPIDEMIA    takes crestor daily  . Hyperlipidemia   . HYPERTENSION    takes Norvasc,Catapress,Hydralazine,Imdur,and Metoprolol daily  . Impaired glucose tolerance   . Insomnia    takes Ambien nightly  . NSTEMI (non-ST elevated myocardial infarction), 06/30/11 07/01/2011  .  RENAL CALCULUS, HX OF   . RENAL INSUFFICIENCY    12/2013- TThSat  . S/P coronary artery stent placement, PTCA/DES Resolute to mid-LAD via the LIMA graft, and PTCA of apical 95% stenosis 07/05/11 07/04/2011  . S/P PTCA (percutaneous transluminal coronary angioplasty), 06/30/11 07/01/2011  . Seizures (HCC)    INSULIN INDUCED  . Sleep apnea    SLEEP STUDY IN [place] , NO MACHINE YET  1 YEAR  . Stroke (HCC)    93/2005/2006/2007;left sided weakness.  1993 12/2013   Past Surgical History:  Procedure Laterality Date  . ARCH AORTOGRAM N/A 02/19/2012   Procedure: ARCH AORTOGRAM;  Surgeon: Fransisco Hertz, MD;  Location: [facility] CATH LAB;  Service: Cardiovascular;  Laterality: N/A;  . AV FISTULA PLACEMENT  12/02/2011   Procedure: ARTERIOVENOUS (AV) FISTULA CREATION;  Surgeon: Fransisco Hertz, MD;  Location: [facility] OR;  Service: Vascular;  Laterality: Left;  Ultrasound Guided  . AV FISTULA PLACEMENT Left 06/08/2012   Procedure: ARTERIOVENOUS (AV) FISTULA CREATION;  Surgeon: Fransisco Hertz, MD;  Location: [facility] OR;  Service: Vascular;  Laterality: Left;  . BASCILIC VEIN TRANSPOSITION Left 09/06/2014   Procedure: FIRST STAGE BASILIC VEIN TRANSPOSITION ;  Surgeon: Fransisco Hertz, MD;  Location: [facility] OR;  Service: Vascular;  Laterality: Left;  . BASCILIC VEIN TRANSPOSITION Left 12/06/2014   Procedure: SECOND STAGE BRACHIAL VEIN  TRANSPOSITION ;  Surgeon: Fransisco Hertz, MD;  Location: [facility] OR;  Service: Vascular;  Laterality: Left;  . CARDIAC CATHETERIZATION    . COLONOSCOPY    . CORONARY ANGIOPLASTY     06/30/11   AT [facility]  . CORONARY ARTERY BYPASS GRAFT  04/01/2011   Procedure: CORONARY ARTERY BYPASS GRAFTING (CABG);  Surgeon: Kathlee Nations Suann Larry, MD;  Location[facility] OR;  Service: Open Heart Surgery;  Laterality: N/A;  . HERNIA REPAIR  2013  . INSERTION OF DIALYSIS CATHETER  04/01/2011   Procedure: INSERTION OF DIALYSIS CATHETER;  Surgeon: Nilda Simmer, MD;  Location[facility] OR;  Service: Vascular;  Laterality: N/A;  . KNEE SURGERY Right  01/07/2011   arthroscopy  . LEFT HEART CATHETERIZATION WITH CORONARY ANGIOGRAM N/A 03/28/2011   Procedure: LEFT HEART CATHETERIZATION WITH CORONARY ANGIOGRAM;  Surgeon: Lennette Bihari, MD;  Location: [facility] CATH LAB;  Service: Cardiovascular;  Laterality: N/A;  pending creatnine  . LEFT HEART CATHETERIZATION WITH CORONARY/GRAFT ANGIOGRAM N/A 06/30/2011   Procedure: LEFT HEART CATHETERIZATION WITH Isabel Caprice;  Surgeon: Thurmon Fair, MD;  Location: [facility] CATH LAB;  Service: Cardiovascular;  Laterality: N/A;  . LIGATION OF ARTERIOVENOUS  FISTULA Left 06/08/2012   Procedure: LIGATION OF ARTERIOVENOUS  FISTULA;  Surgeon: Fransisco Hertz, MD;  Location: [facility] OR;  Service: Vascular;  Laterality: Left;  . NM MYOCAR PERF EJECTION FRACTION  06/25/2011   There is evidence of mild ischemia in the basal inferolateral and mid inferolateral regions. (Extent 7 %) The post-stress ejection fraction is 44%. There is mild hypocontractility in the distal inferoapical segment. baseline T wave inversion is noted in leads I, avL, II, V2. This is a low risk scan. The present perfusion study suggests significant myocardial salvage with mild residual ischemia .  Marland Kitchen PERCUTANEOUS CORONARY INTERVENTION-BALLOON ONLY  06/30/2011   Procedure: PERCUTANEOUS CORONARY INTERVENTION-BALLOON ONLY;  Surgeon: Thurmon Fair, MD;  Location: [facility] CATH LAB;  Service: Cardiovascular;;  . PERCUTANEOUS CORONARY STENT INTERVENTION (PCI-S) N/A 07/04/2011   Procedure: PERCUTANEOUS CORONARY STENT INTERVENTION (PCI-S);  Surgeon: Lennette Bihari, MD;  Location: [facility] CATH LAB;  Service: Cardiovascular;  Laterality: N/A;  . REVISION OF ARTERIOVENOUS GORETEX GRAFT Left 12/06/2014   Procedure: REVISION OF ARTERIOVENOUS GORETEX GRAFT USING 15mmx10cm;  Surgeon: Fransisco Hertz, MD;  Location: [facility] OR;  Service: Vascular;  Laterality: Left;  . STOMACH SURGERY  2009   achalasia  . SUBCLAVIAN STENT PLACEMENT  12/20/2011  . TONSILLECTOMY      reports that he has never  smoked. He quit smokeless tobacco use about 13 years ago. His smokeless tobacco use included Chew. He reports that he drinks about 1.2 oz of alcohol per week . He reports that he does not use drugs. family history includes Alzheimer's disease in his mother; Cancer in his father and mother; Heart disease in his father and mother; Hyperlipidemia in his mother and other; Hypertension in his mother and other; Stroke in his other. Allergies  Allergen Reactions  . Shrimp [Shellfish Allergy] Shortness Of Breath  . Insulins Other (See Comments)    Patient unsure as to the kind of insulin but states that it has previously caused seizures. This occurred in the hospital in 2006; but in most recent hospitalization (Jan 2013) received insulin but did not have reaction  . Atorvastatin Other (See Comments)    weakness  . Simvastatin Other (See Comments)     abnormal liver tests  . Ultram [Tramadol] Other (See Comments)    Liver enzyme  Current Outpatient Prescriptions on File Prior to Visit  Medication Sig Dispense Refill  . acetaminophen (TYLENOL) 500 MG tablet Take 1,000 mg by mouth every 6 (six) hours as needed (pain).    Marland Kitchen amLODipine (NORVASC) 5 MG tablet Take 1 tablet (5 mg total) by mouth daily. 90 tablet 3  . aspirin EC 81 MG EC tablet Take 1 tablet (81 mg total) by mouth daily.    Marland Kitchen atorvastatin (LIPITOR) 20 MG tablet Take 1 tablet (20 mg total) by mouth daily. 30 tablet 1  . cinacalcet (SENSIPAR) 30 MG tablet Take 60 mg by mouth at bedtime. Reported on 07/05/2015    . clopidogrel (PLAVIX) 75 MG tablet Take 1 tablet (75 mg total) by mouth daily. 90 tablet 1  . febuxostat (ULORIC) 40 MG tablet Take 40 mg by mouth daily.     . isosorbide mononitrate (IMDUR) 60 MG 24 hr tablet Take 1/2 tablet twice daily 90 tablet 3  . lanthanum ([name]) 1000 MG chewable tablet Chew 2 tablets (2,000 mg total) by mouth 3 (three) times daily with meals. Yearly physical due in July must see MD for refills 540 tablet 0    . lidocaine-prilocaine (EMLA) cream Apply 1 application topically as needed. Apply as needed for dialysis site    . memantine (NAMENDA) 10 MG tablet Take 1 tablet (10 mg total) by mouth 2 (two) times daily. 60 tablet 11  . metoprolol (LOPRESSOR) 50 MG tablet Take 1 tablet (50 mg total) by mouth 2 (two) times daily. 180 tablet 3  . NITROSTAT 0.4 MG SL tablet PLACE 1 TABLET UNDER TONGUE AS NEEDED FOR CHEST PAIN EVERY 5 MINUTES (MAX 3 DOSES) 25 tablet 4  . terazosin (HYTRIN) 10 MG capsule TAKE 1 CAPSULE BY MOUTH DAILY AT BEDTIME 90 capsule 3  . ticagrelor (BRILINTA) 90 MG TABS tablet Take 90 mg by mouth 2 (two) times daily.    Marland Kitchen zolpidem (AMBIEN) 10 MG tablet Take 1 tablet (10 mg total) by mouth at bedtime as needed for sleep. Please make return office visit for further refills 90 tablet 1   No current facility-administered medications on file prior to visit.    Review of Systems  Constitutional: Negative for unusual diaphoresis or night sweats HENT: Negative for ear swelling or discharge Eyes: Negative for worsening visual haziness  Respiratory: Negative for choking and stridor.   Gastrointestinal: Negative for distension or worsening eructation Genitourinary: Negative for retention or change in urine volume.  Musculoskeletal: Negative for other MSK pain or swelling Skin: Negative for color change and worsening wound Neurological: Negative for tremors and numbness other than noted  Psychiatric/Behavioral: Negative for decreased concentration or agitation other than above   All other system neg per pt    Objective:   Physical Exam BP 138/86   Pulse 72   Temp 97.6 F (36.4 C)   Ht 6' (1.829 m)   Wt 217 lb (98.4 kg)   SpO2 96%   BMI 29.43 kg/m  VS noted, overwt Constitutional: Pt appears in no apparent distress HENT: Head: NCAT.  Right Ear: External ear normal.  Left Ear: External ear normal.  Eyes: . Pupils are equal, round, and reactive to light. Conjunctivae and EOM are  normal Neck: Normal range of motion. Neck supple.  Cardiovascular: Normal rate and regular rhythm.   Pulmonary/Chest: Effort normal and breath sounds without rales or wheezing.  Neurological: Pt is alert. Not confused , motor grossly intact Skin: Skin is warm. No rash, no LE edema Psychiatric:  Pt behavior is normal. No agitation.     Assessment & Plan:

## 2016-05-07 DIAGNOSIS — N186 End stage renal disease: Secondary | ICD-10-CM | POA: Diagnosis not present

## 2016-05-07 DIAGNOSIS — N2581 Secondary hyperparathyroidism of renal origin: Secondary | ICD-10-CM | POA: Diagnosis not present

## 2016-05-07 DIAGNOSIS — D631 Anemia in chronic kidney disease: Secondary | ICD-10-CM | POA: Diagnosis not present

## 2016-05-09 DIAGNOSIS — N186 End stage renal disease: Secondary | ICD-10-CM | POA: Diagnosis not present

## 2016-05-09 DIAGNOSIS — D631 Anemia in chronic kidney disease: Secondary | ICD-10-CM | POA: Diagnosis not present

## 2016-05-09 DIAGNOSIS — N2581 Secondary hyperparathyroidism of renal origin: Secondary | ICD-10-CM | POA: Diagnosis not present

## 2016-05-11 NOTE — Assessment & Plan Note (Signed)
Lab Results  Component Value Date   PSA 4.37 (H) 05/06/2016   PSA 5.82 (H) 10/31/2015   PSA 3.78 10/06/2014  overall stable, declines urology referral for now

## 2016-05-11 NOTE — Assessment & Plan Note (Signed)
stable overall by history and exam, recent data reviewed with pt, and pt to continue medical treatment as before,  to f/u any worsening symptoms or concerns Lab Results  Component Value Date   LDLCALC 89 12/30/2013   For f/u lab, cont'd low chol diet

## 2016-05-11 NOTE — Assessment & Plan Note (Signed)
stable overall by history and exam, recent data reviewed with pt, and pt to continue medical treatment as before,  to f/u any worsening symptoms or concerns Lab Results  Component Value Date   HGBA1C 5.2 05/06/2016

## 2016-05-12 DIAGNOSIS — N186 End stage renal disease: Secondary | ICD-10-CM | POA: Diagnosis not present

## 2016-05-12 DIAGNOSIS — D631 Anemia in chronic kidney disease: Secondary | ICD-10-CM | POA: Diagnosis not present

## 2016-05-12 DIAGNOSIS — N2581 Secondary hyperparathyroidism of renal origin: Secondary | ICD-10-CM | POA: Diagnosis not present

## 2016-05-14 ENCOUNTER — Other Ambulatory Visit: Payer: Self-pay | Admitting: Internal Medicine

## 2016-05-14 DIAGNOSIS — N186 End stage renal disease: Secondary | ICD-10-CM | POA: Diagnosis not present

## 2016-05-14 DIAGNOSIS — I120 Hypertensive chronic kidney disease with stage 5 chronic kidney disease or end stage renal disease: Secondary | ICD-10-CM | POA: Diagnosis not present

## 2016-05-14 DIAGNOSIS — D631 Anemia in chronic kidney disease: Secondary | ICD-10-CM | POA: Diagnosis not present

## 2016-05-14 DIAGNOSIS — N2581 Secondary hyperparathyroidism of renal origin: Secondary | ICD-10-CM | POA: Diagnosis not present

## 2016-05-14 DIAGNOSIS — Z992 Dependence on renal dialysis: Secondary | ICD-10-CM | POA: Diagnosis not present

## 2016-05-15 NOTE — Telephone Encounter (Signed)
Faxed script back to [facility].../lmb 

## 2016-05-15 NOTE — Telephone Encounter (Signed)
Done hardcopy to Anna  

## 2016-05-16 DIAGNOSIS — N186 End stage renal disease: Secondary | ICD-10-CM | POA: Diagnosis not present

## 2016-05-16 DIAGNOSIS — N2581 Secondary hyperparathyroidism of renal origin: Secondary | ICD-10-CM | POA: Diagnosis not present

## 2016-05-19 DIAGNOSIS — N2581 Secondary hyperparathyroidism of renal origin: Secondary | ICD-10-CM | POA: Diagnosis not present

## 2016-05-19 DIAGNOSIS — N186 End stage renal disease: Secondary | ICD-10-CM | POA: Diagnosis not present

## 2016-05-21 DIAGNOSIS — N186 End stage renal disease: Secondary | ICD-10-CM | POA: Diagnosis not present

## 2016-05-21 DIAGNOSIS — N2581 Secondary hyperparathyroidism of renal origin: Secondary | ICD-10-CM | POA: Diagnosis not present

## 2016-05-23 DIAGNOSIS — N186 End stage renal disease: Secondary | ICD-10-CM | POA: Diagnosis not present

## 2016-05-23 DIAGNOSIS — N2581 Secondary hyperparathyroidism of renal origin: Secondary | ICD-10-CM | POA: Diagnosis not present

## 2016-05-26 DIAGNOSIS — N2581 Secondary hyperparathyroidism of renal origin: Secondary | ICD-10-CM | POA: Diagnosis not present

## 2016-05-26 DIAGNOSIS — N186 End stage renal disease: Secondary | ICD-10-CM | POA: Diagnosis not present

## 2016-05-28 DIAGNOSIS — N186 End stage renal disease: Secondary | ICD-10-CM | POA: Diagnosis not present

## 2016-05-28 DIAGNOSIS — N2581 Secondary hyperparathyroidism of renal origin: Secondary | ICD-10-CM | POA: Diagnosis not present

## 2016-05-30 DIAGNOSIS — N2581 Secondary hyperparathyroidism of renal origin: Secondary | ICD-10-CM | POA: Diagnosis not present

## 2016-05-30 DIAGNOSIS — N186 End stage renal disease: Secondary | ICD-10-CM | POA: Diagnosis not present

## 2016-06-02 DIAGNOSIS — N2581 Secondary hyperparathyroidism of renal origin: Secondary | ICD-10-CM | POA: Diagnosis not present

## 2016-06-02 DIAGNOSIS — N186 End stage renal disease: Secondary | ICD-10-CM | POA: Diagnosis not present

## 2016-06-04 DIAGNOSIS — N186 End stage renal disease: Secondary | ICD-10-CM | POA: Diagnosis not present

## 2016-06-04 DIAGNOSIS — N2581 Secondary hyperparathyroidism of renal origin: Secondary | ICD-10-CM | POA: Diagnosis not present

## 2016-06-06 DIAGNOSIS — N2581 Secondary hyperparathyroidism of renal origin: Secondary | ICD-10-CM | POA: Diagnosis not present

## 2016-06-06 DIAGNOSIS — N186 End stage renal disease: Secondary | ICD-10-CM | POA: Diagnosis not present

## 2016-06-09 DIAGNOSIS — N2581 Secondary hyperparathyroidism of renal origin: Secondary | ICD-10-CM | POA: Diagnosis not present

## 2016-06-09 DIAGNOSIS — N186 End stage renal disease: Secondary | ICD-10-CM | POA: Diagnosis not present

## 2016-06-11 DIAGNOSIS — N186 End stage renal disease: Secondary | ICD-10-CM | POA: Diagnosis not present

## 2016-06-11 DIAGNOSIS — N2581 Secondary hyperparathyroidism of renal origin: Secondary | ICD-10-CM | POA: Diagnosis not present

## 2016-06-13 DIAGNOSIS — N186 End stage renal disease: Secondary | ICD-10-CM | POA: Diagnosis not present

## 2016-06-13 DIAGNOSIS — N2581 Secondary hyperparathyroidism of renal origin: Secondary | ICD-10-CM | POA: Diagnosis not present

## 2016-06-14 DIAGNOSIS — N186 End stage renal disease: Secondary | ICD-10-CM | POA: Diagnosis not present

## 2016-06-14 DIAGNOSIS — I120 Hypertensive chronic kidney disease with stage 5 chronic kidney disease or end stage renal disease: Secondary | ICD-10-CM | POA: Diagnosis not present

## 2016-06-14 DIAGNOSIS — Z992 Dependence on renal dialysis: Secondary | ICD-10-CM | POA: Diagnosis not present

## 2016-06-16 DIAGNOSIS — N2581 Secondary hyperparathyroidism of renal origin: Secondary | ICD-10-CM | POA: Diagnosis not present

## 2016-06-16 DIAGNOSIS — N186 End stage renal disease: Secondary | ICD-10-CM | POA: Diagnosis not present

## 2016-06-16 DIAGNOSIS — D509 Iron deficiency anemia, unspecified: Secondary | ICD-10-CM | POA: Diagnosis not present

## 2016-06-18 DIAGNOSIS — N186 End stage renal disease: Secondary | ICD-10-CM | POA: Diagnosis not present

## 2016-06-18 DIAGNOSIS — N2581 Secondary hyperparathyroidism of renal origin: Secondary | ICD-10-CM | POA: Diagnosis not present

## 2016-06-18 DIAGNOSIS — D509 Iron deficiency anemia, unspecified: Secondary | ICD-10-CM | POA: Diagnosis not present

## 2016-06-20 DIAGNOSIS — N2581 Secondary hyperparathyroidism of renal origin: Secondary | ICD-10-CM | POA: Diagnosis not present

## 2016-06-20 DIAGNOSIS — D509 Iron deficiency anemia, unspecified: Secondary | ICD-10-CM | POA: Diagnosis not present

## 2016-06-20 DIAGNOSIS — N186 End stage renal disease: Secondary | ICD-10-CM | POA: Diagnosis not present

## 2016-06-23 DIAGNOSIS — D509 Iron deficiency anemia, unspecified: Secondary | ICD-10-CM | POA: Diagnosis not present

## 2016-06-23 DIAGNOSIS — N186 End stage renal disease: Secondary | ICD-10-CM | POA: Diagnosis not present

## 2016-06-23 DIAGNOSIS — N2581 Secondary hyperparathyroidism of renal origin: Secondary | ICD-10-CM | POA: Diagnosis not present

## 2016-06-25 DIAGNOSIS — D509 Iron deficiency anemia, unspecified: Secondary | ICD-10-CM | POA: Diagnosis not present

## 2016-06-25 DIAGNOSIS — N2581 Secondary hyperparathyroidism of renal origin: Secondary | ICD-10-CM | POA: Diagnosis not present

## 2016-06-25 DIAGNOSIS — N186 End stage renal disease: Secondary | ICD-10-CM | POA: Diagnosis not present

## 2016-06-27 DIAGNOSIS — N186 End stage renal disease: Secondary | ICD-10-CM | POA: Diagnosis not present

## 2016-06-27 DIAGNOSIS — N2581 Secondary hyperparathyroidism of renal origin: Secondary | ICD-10-CM | POA: Diagnosis not present

## 2016-06-27 DIAGNOSIS — D509 Iron deficiency anemia, unspecified: Secondary | ICD-10-CM | POA: Diagnosis not present

## 2016-06-30 DIAGNOSIS — N186 End stage renal disease: Secondary | ICD-10-CM | POA: Diagnosis not present

## 2016-06-30 DIAGNOSIS — N2581 Secondary hyperparathyroidism of renal origin: Secondary | ICD-10-CM | POA: Diagnosis not present

## 2016-06-30 DIAGNOSIS — D509 Iron deficiency anemia, unspecified: Secondary | ICD-10-CM | POA: Diagnosis not present

## 2016-07-02 DIAGNOSIS — N2581 Secondary hyperparathyroidism of renal origin: Secondary | ICD-10-CM | POA: Diagnosis not present

## 2016-07-02 DIAGNOSIS — N186 End stage renal disease: Secondary | ICD-10-CM | POA: Diagnosis not present

## 2016-07-02 DIAGNOSIS — D509 Iron deficiency anemia, unspecified: Secondary | ICD-10-CM | POA: Diagnosis not present

## 2016-07-03 DIAGNOSIS — I871 Compression of vein: Secondary | ICD-10-CM | POA: Diagnosis not present

## 2016-07-03 DIAGNOSIS — N186 End stage renal disease: Secondary | ICD-10-CM | POA: Diagnosis not present

## 2016-07-03 DIAGNOSIS — T82858A Stenosis of vascular prosthetic devices, implants and grafts, initial encounter: Secondary | ICD-10-CM | POA: Diagnosis not present

## 2016-07-03 DIAGNOSIS — Z992 Dependence on renal dialysis: Secondary | ICD-10-CM | POA: Diagnosis not present

## 2016-07-04 DIAGNOSIS — D509 Iron deficiency anemia, unspecified: Secondary | ICD-10-CM | POA: Diagnosis not present

## 2016-07-04 DIAGNOSIS — N186 End stage renal disease: Secondary | ICD-10-CM | POA: Diagnosis not present

## 2016-07-04 DIAGNOSIS — N2581 Secondary hyperparathyroidism of renal origin: Secondary | ICD-10-CM | POA: Diagnosis not present

## 2016-07-07 DIAGNOSIS — N2581 Secondary hyperparathyroidism of renal origin: Secondary | ICD-10-CM | POA: Diagnosis not present

## 2016-07-07 DIAGNOSIS — D509 Iron deficiency anemia, unspecified: Secondary | ICD-10-CM | POA: Diagnosis not present

## 2016-07-07 DIAGNOSIS — N186 End stage renal disease: Secondary | ICD-10-CM | POA: Diagnosis not present

## 2016-07-09 DIAGNOSIS — D509 Iron deficiency anemia, unspecified: Secondary | ICD-10-CM | POA: Diagnosis not present

## 2016-07-09 DIAGNOSIS — N2581 Secondary hyperparathyroidism of renal origin: Secondary | ICD-10-CM | POA: Diagnosis not present

## 2016-07-09 DIAGNOSIS — N186 End stage renal disease: Secondary | ICD-10-CM | POA: Diagnosis not present

## 2016-07-11 DIAGNOSIS — D509 Iron deficiency anemia, unspecified: Secondary | ICD-10-CM | POA: Diagnosis not present

## 2016-07-11 DIAGNOSIS — N186 End stage renal disease: Secondary | ICD-10-CM | POA: Diagnosis not present

## 2016-07-11 DIAGNOSIS — N2581 Secondary hyperparathyroidism of renal origin: Secondary | ICD-10-CM | POA: Diagnosis not present

## 2016-07-14 DIAGNOSIS — D509 Iron deficiency anemia, unspecified: Secondary | ICD-10-CM | POA: Diagnosis not present

## 2016-07-14 DIAGNOSIS — I120 Hypertensive chronic kidney disease with stage 5 chronic kidney disease or end stage renal disease: Secondary | ICD-10-CM | POA: Diagnosis not present

## 2016-07-14 DIAGNOSIS — N186 End stage renal disease: Secondary | ICD-10-CM | POA: Diagnosis not present

## 2016-07-14 DIAGNOSIS — Z992 Dependence on renal dialysis: Secondary | ICD-10-CM | POA: Diagnosis not present

## 2016-07-14 DIAGNOSIS — N2581 Secondary hyperparathyroidism of renal origin: Secondary | ICD-10-CM | POA: Diagnosis not present

## 2016-07-16 DIAGNOSIS — N2581 Secondary hyperparathyroidism of renal origin: Secondary | ICD-10-CM | POA: Diagnosis not present

## 2016-07-16 DIAGNOSIS — D631 Anemia in chronic kidney disease: Secondary | ICD-10-CM | POA: Diagnosis not present

## 2016-07-16 DIAGNOSIS — N186 End stage renal disease: Secondary | ICD-10-CM | POA: Diagnosis not present

## 2016-07-16 DIAGNOSIS — D509 Iron deficiency anemia, unspecified: Secondary | ICD-10-CM | POA: Diagnosis not present

## 2016-07-18 DIAGNOSIS — D631 Anemia in chronic kidney disease: Secondary | ICD-10-CM | POA: Diagnosis not present

## 2016-07-18 DIAGNOSIS — D509 Iron deficiency anemia, unspecified: Secondary | ICD-10-CM | POA: Diagnosis not present

## 2016-07-18 DIAGNOSIS — N2581 Secondary hyperparathyroidism of renal origin: Secondary | ICD-10-CM | POA: Diagnosis not present

## 2016-07-18 DIAGNOSIS — N186 End stage renal disease: Secondary | ICD-10-CM | POA: Diagnosis not present

## 2016-07-21 DIAGNOSIS — D631 Anemia in chronic kidney disease: Secondary | ICD-10-CM | POA: Diagnosis not present

## 2016-07-21 DIAGNOSIS — N2581 Secondary hyperparathyroidism of renal origin: Secondary | ICD-10-CM | POA: Diagnosis not present

## 2016-07-21 DIAGNOSIS — N186 End stage renal disease: Secondary | ICD-10-CM | POA: Diagnosis not present

## 2016-07-21 DIAGNOSIS — D509 Iron deficiency anemia, unspecified: Secondary | ICD-10-CM | POA: Diagnosis not present

## 2016-07-23 DIAGNOSIS — D509 Iron deficiency anemia, unspecified: Secondary | ICD-10-CM | POA: Diagnosis not present

## 2016-07-23 DIAGNOSIS — D631 Anemia in chronic kidney disease: Secondary | ICD-10-CM | POA: Diagnosis not present

## 2016-07-23 DIAGNOSIS — N186 End stage renal disease: Secondary | ICD-10-CM | POA: Diagnosis not present

## 2016-07-23 DIAGNOSIS — N2581 Secondary hyperparathyroidism of renal origin: Secondary | ICD-10-CM | POA: Diagnosis not present

## 2016-07-25 DIAGNOSIS — N186 End stage renal disease: Secondary | ICD-10-CM | POA: Diagnosis not present

## 2016-07-25 DIAGNOSIS — D509 Iron deficiency anemia, unspecified: Secondary | ICD-10-CM | POA: Diagnosis not present

## 2016-07-25 DIAGNOSIS — D631 Anemia in chronic kidney disease: Secondary | ICD-10-CM | POA: Diagnosis not present

## 2016-07-25 DIAGNOSIS — N2581 Secondary hyperparathyroidism of renal origin: Secondary | ICD-10-CM | POA: Diagnosis not present

## 2016-07-28 DIAGNOSIS — D631 Anemia in chronic kidney disease: Secondary | ICD-10-CM | POA: Diagnosis not present

## 2016-07-28 DIAGNOSIS — N186 End stage renal disease: Secondary | ICD-10-CM | POA: Diagnosis not present

## 2016-07-28 DIAGNOSIS — D509 Iron deficiency anemia, unspecified: Secondary | ICD-10-CM | POA: Diagnosis not present

## 2016-07-28 DIAGNOSIS — N2581 Secondary hyperparathyroidism of renal origin: Secondary | ICD-10-CM | POA: Diagnosis not present

## 2016-07-30 DIAGNOSIS — N2581 Secondary hyperparathyroidism of renal origin: Secondary | ICD-10-CM | POA: Diagnosis not present

## 2016-07-30 DIAGNOSIS — D509 Iron deficiency anemia, unspecified: Secondary | ICD-10-CM | POA: Diagnosis not present

## 2016-07-30 DIAGNOSIS — N186 End stage renal disease: Secondary | ICD-10-CM | POA: Diagnosis not present

## 2016-07-30 DIAGNOSIS — D631 Anemia in chronic kidney disease: Secondary | ICD-10-CM | POA: Diagnosis not present

## 2016-08-01 DIAGNOSIS — N186 End stage renal disease: Secondary | ICD-10-CM | POA: Diagnosis not present

## 2016-08-01 DIAGNOSIS — D631 Anemia in chronic kidney disease: Secondary | ICD-10-CM | POA: Diagnosis not present

## 2016-08-01 DIAGNOSIS — N2581 Secondary hyperparathyroidism of renal origin: Secondary | ICD-10-CM | POA: Diagnosis not present

## 2016-08-01 DIAGNOSIS — D509 Iron deficiency anemia, unspecified: Secondary | ICD-10-CM | POA: Diagnosis not present

## 2016-08-04 DIAGNOSIS — N2581 Secondary hyperparathyroidism of renal origin: Secondary | ICD-10-CM | POA: Diagnosis not present

## 2016-08-04 DIAGNOSIS — D509 Iron deficiency anemia, unspecified: Secondary | ICD-10-CM | POA: Diagnosis not present

## 2016-08-04 DIAGNOSIS — N186 End stage renal disease: Secondary | ICD-10-CM | POA: Diagnosis not present

## 2016-08-04 DIAGNOSIS — D631 Anemia in chronic kidney disease: Secondary | ICD-10-CM | POA: Diagnosis not present

## 2016-08-06 DIAGNOSIS — D509 Iron deficiency anemia, unspecified: Secondary | ICD-10-CM | POA: Diagnosis not present

## 2016-08-06 DIAGNOSIS — N2581 Secondary hyperparathyroidism of renal origin: Secondary | ICD-10-CM | POA: Diagnosis not present

## 2016-08-06 DIAGNOSIS — D631 Anemia in chronic kidney disease: Secondary | ICD-10-CM | POA: Diagnosis not present

## 2016-08-06 DIAGNOSIS — N186 End stage renal disease: Secondary | ICD-10-CM | POA: Diagnosis not present

## 2016-08-08 DIAGNOSIS — N186 End stage renal disease: Secondary | ICD-10-CM | POA: Diagnosis not present

## 2016-08-08 DIAGNOSIS — D631 Anemia in chronic kidney disease: Secondary | ICD-10-CM | POA: Diagnosis not present

## 2016-08-08 DIAGNOSIS — N2581 Secondary hyperparathyroidism of renal origin: Secondary | ICD-10-CM | POA: Diagnosis not present

## 2016-08-08 DIAGNOSIS — D509 Iron deficiency anemia, unspecified: Secondary | ICD-10-CM | POA: Diagnosis not present

## 2016-08-11 DIAGNOSIS — N186 End stage renal disease: Secondary | ICD-10-CM | POA: Diagnosis not present

## 2016-08-11 DIAGNOSIS — N2581 Secondary hyperparathyroidism of renal origin: Secondary | ICD-10-CM | POA: Diagnosis not present

## 2016-08-11 DIAGNOSIS — D509 Iron deficiency anemia, unspecified: Secondary | ICD-10-CM | POA: Diagnosis not present

## 2016-08-11 DIAGNOSIS — D631 Anemia in chronic kidney disease: Secondary | ICD-10-CM | POA: Diagnosis not present

## 2016-08-13 DIAGNOSIS — N186 End stage renal disease: Secondary | ICD-10-CM | POA: Diagnosis not present

## 2016-08-13 DIAGNOSIS — N2581 Secondary hyperparathyroidism of renal origin: Secondary | ICD-10-CM | POA: Diagnosis not present

## 2016-08-13 DIAGNOSIS — D631 Anemia in chronic kidney disease: Secondary | ICD-10-CM | POA: Diagnosis not present

## 2016-08-13 DIAGNOSIS — D509 Iron deficiency anemia, unspecified: Secondary | ICD-10-CM | POA: Diagnosis not present

## 2016-08-14 DIAGNOSIS — Z992 Dependence on renal dialysis: Secondary | ICD-10-CM | POA: Diagnosis not present

## 2016-08-14 DIAGNOSIS — I120 Hypertensive chronic kidney disease with stage 5 chronic kidney disease or end stage renal disease: Secondary | ICD-10-CM | POA: Diagnosis not present

## 2016-08-14 DIAGNOSIS — N186 End stage renal disease: Secondary | ICD-10-CM | POA: Diagnosis not present

## 2016-08-15 DIAGNOSIS — N2581 Secondary hyperparathyroidism of renal origin: Secondary | ICD-10-CM | POA: Diagnosis not present

## 2016-08-15 DIAGNOSIS — D631 Anemia in chronic kidney disease: Secondary | ICD-10-CM | POA: Diagnosis not present

## 2016-08-15 DIAGNOSIS — D509 Iron deficiency anemia, unspecified: Secondary | ICD-10-CM | POA: Diagnosis not present

## 2016-08-15 DIAGNOSIS — N186 End stage renal disease: Secondary | ICD-10-CM | POA: Diagnosis not present

## 2016-08-18 DIAGNOSIS — N2581 Secondary hyperparathyroidism of renal origin: Secondary | ICD-10-CM | POA: Diagnosis not present

## 2016-08-18 DIAGNOSIS — N186 End stage renal disease: Secondary | ICD-10-CM | POA: Diagnosis not present

## 2016-08-18 DIAGNOSIS — D509 Iron deficiency anemia, unspecified: Secondary | ICD-10-CM | POA: Diagnosis not present

## 2016-08-18 DIAGNOSIS — D631 Anemia in chronic kidney disease: Secondary | ICD-10-CM | POA: Diagnosis not present

## 2016-08-20 DIAGNOSIS — D631 Anemia in chronic kidney disease: Secondary | ICD-10-CM | POA: Diagnosis not present

## 2016-08-20 DIAGNOSIS — D509 Iron deficiency anemia, unspecified: Secondary | ICD-10-CM | POA: Diagnosis not present

## 2016-08-20 DIAGNOSIS — N2581 Secondary hyperparathyroidism of renal origin: Secondary | ICD-10-CM | POA: Diagnosis not present

## 2016-08-20 DIAGNOSIS — N186 End stage renal disease: Secondary | ICD-10-CM | POA: Diagnosis not present

## 2016-08-22 DIAGNOSIS — N2581 Secondary hyperparathyroidism of renal origin: Secondary | ICD-10-CM | POA: Diagnosis not present

## 2016-08-22 DIAGNOSIS — D631 Anemia in chronic kidney disease: Secondary | ICD-10-CM | POA: Diagnosis not present

## 2016-08-22 DIAGNOSIS — N186 End stage renal disease: Secondary | ICD-10-CM | POA: Diagnosis not present

## 2016-08-22 DIAGNOSIS — D509 Iron deficiency anemia, unspecified: Secondary | ICD-10-CM | POA: Diagnosis not present

## 2016-08-25 DIAGNOSIS — D509 Iron deficiency anemia, unspecified: Secondary | ICD-10-CM | POA: Diagnosis not present

## 2016-08-25 DIAGNOSIS — N186 End stage renal disease: Secondary | ICD-10-CM | POA: Diagnosis not present

## 2016-08-25 DIAGNOSIS — N2581 Secondary hyperparathyroidism of renal origin: Secondary | ICD-10-CM | POA: Diagnosis not present

## 2016-08-25 DIAGNOSIS — D631 Anemia in chronic kidney disease: Secondary | ICD-10-CM | POA: Diagnosis not present

## 2016-08-27 DIAGNOSIS — N186 End stage renal disease: Secondary | ICD-10-CM | POA: Diagnosis not present

## 2016-08-27 DIAGNOSIS — N2581 Secondary hyperparathyroidism of renal origin: Secondary | ICD-10-CM | POA: Diagnosis not present

## 2016-08-27 DIAGNOSIS — D509 Iron deficiency anemia, unspecified: Secondary | ICD-10-CM | POA: Diagnosis not present

## 2016-08-27 DIAGNOSIS — D631 Anemia in chronic kidney disease: Secondary | ICD-10-CM | POA: Diagnosis not present

## 2016-08-29 DIAGNOSIS — D631 Anemia in chronic kidney disease: Secondary | ICD-10-CM | POA: Diagnosis not present

## 2016-08-29 DIAGNOSIS — N186 End stage renal disease: Secondary | ICD-10-CM | POA: Diagnosis not present

## 2016-08-29 DIAGNOSIS — D509 Iron deficiency anemia, unspecified: Secondary | ICD-10-CM | POA: Diagnosis not present

## 2016-08-29 DIAGNOSIS — N2581 Secondary hyperparathyroidism of renal origin: Secondary | ICD-10-CM | POA: Diagnosis not present

## 2016-09-01 DIAGNOSIS — D509 Iron deficiency anemia, unspecified: Secondary | ICD-10-CM | POA: Diagnosis not present

## 2016-09-01 DIAGNOSIS — N2581 Secondary hyperparathyroidism of renal origin: Secondary | ICD-10-CM | POA: Diagnosis not present

## 2016-09-01 DIAGNOSIS — N186 End stage renal disease: Secondary | ICD-10-CM | POA: Diagnosis not present

## 2016-09-01 DIAGNOSIS — D631 Anemia in chronic kidney disease: Secondary | ICD-10-CM | POA: Diagnosis not present

## 2016-09-03 DIAGNOSIS — D631 Anemia in chronic kidney disease: Secondary | ICD-10-CM | POA: Diagnosis not present

## 2016-09-03 DIAGNOSIS — D509 Iron deficiency anemia, unspecified: Secondary | ICD-10-CM | POA: Diagnosis not present

## 2016-09-03 DIAGNOSIS — N2581 Secondary hyperparathyroidism of renal origin: Secondary | ICD-10-CM | POA: Diagnosis not present

## 2016-09-03 DIAGNOSIS — N186 End stage renal disease: Secondary | ICD-10-CM | POA: Diagnosis not present

## 2016-09-05 DIAGNOSIS — D631 Anemia in chronic kidney disease: Secondary | ICD-10-CM | POA: Diagnosis not present

## 2016-09-05 DIAGNOSIS — N186 End stage renal disease: Secondary | ICD-10-CM | POA: Diagnosis not present

## 2016-09-05 DIAGNOSIS — N2581 Secondary hyperparathyroidism of renal origin: Secondary | ICD-10-CM | POA: Diagnosis not present

## 2016-09-05 DIAGNOSIS — D509 Iron deficiency anemia, unspecified: Secondary | ICD-10-CM | POA: Diagnosis not present

## 2016-09-08 DIAGNOSIS — N186 End stage renal disease: Secondary | ICD-10-CM | POA: Diagnosis not present

## 2016-09-08 DIAGNOSIS — N2581 Secondary hyperparathyroidism of renal origin: Secondary | ICD-10-CM | POA: Diagnosis not present

## 2016-09-08 DIAGNOSIS — D509 Iron deficiency anemia, unspecified: Secondary | ICD-10-CM | POA: Diagnosis not present

## 2016-09-08 DIAGNOSIS — D631 Anemia in chronic kidney disease: Secondary | ICD-10-CM | POA: Diagnosis not present

## 2016-09-10 DIAGNOSIS — N2581 Secondary hyperparathyroidism of renal origin: Secondary | ICD-10-CM | POA: Diagnosis not present

## 2016-09-10 DIAGNOSIS — D509 Iron deficiency anemia, unspecified: Secondary | ICD-10-CM | POA: Diagnosis not present

## 2016-09-10 DIAGNOSIS — D631 Anemia in chronic kidney disease: Secondary | ICD-10-CM | POA: Diagnosis not present

## 2016-09-10 DIAGNOSIS — N186 End stage renal disease: Secondary | ICD-10-CM | POA: Diagnosis not present

## 2016-09-12 DIAGNOSIS — D631 Anemia in chronic kidney disease: Secondary | ICD-10-CM | POA: Diagnosis not present

## 2016-09-12 DIAGNOSIS — N186 End stage renal disease: Secondary | ICD-10-CM | POA: Diagnosis not present

## 2016-09-12 DIAGNOSIS — N2581 Secondary hyperparathyroidism of renal origin: Secondary | ICD-10-CM | POA: Diagnosis not present

## 2016-09-12 DIAGNOSIS — D509 Iron deficiency anemia, unspecified: Secondary | ICD-10-CM | POA: Diagnosis not present

## 2016-09-13 DIAGNOSIS — Z992 Dependence on renal dialysis: Secondary | ICD-10-CM | POA: Diagnosis not present

## 2016-09-13 DIAGNOSIS — N186 End stage renal disease: Secondary | ICD-10-CM | POA: Diagnosis not present

## 2016-09-13 DIAGNOSIS — I120 Hypertensive chronic kidney disease with stage 5 chronic kidney disease or end stage renal disease: Secondary | ICD-10-CM | POA: Diagnosis not present

## 2016-09-15 DIAGNOSIS — N2581 Secondary hyperparathyroidism of renal origin: Secondary | ICD-10-CM | POA: Diagnosis not present

## 2016-09-15 DIAGNOSIS — D631 Anemia in chronic kidney disease: Secondary | ICD-10-CM | POA: Diagnosis not present

## 2016-09-15 DIAGNOSIS — N186 End stage renal disease: Secondary | ICD-10-CM | POA: Diagnosis not present

## 2016-09-15 DIAGNOSIS — D509 Iron deficiency anemia, unspecified: Secondary | ICD-10-CM | POA: Diagnosis not present

## 2016-09-17 DIAGNOSIS — N186 End stage renal disease: Secondary | ICD-10-CM | POA: Diagnosis not present

## 2016-09-17 DIAGNOSIS — N2581 Secondary hyperparathyroidism of renal origin: Secondary | ICD-10-CM | POA: Diagnosis not present

## 2016-09-17 DIAGNOSIS — D509 Iron deficiency anemia, unspecified: Secondary | ICD-10-CM | POA: Diagnosis not present

## 2016-09-17 DIAGNOSIS — D631 Anemia in chronic kidney disease: Secondary | ICD-10-CM | POA: Diagnosis not present

## 2016-09-18 DIAGNOSIS — Z992 Dependence on renal dialysis: Secondary | ICD-10-CM | POA: Diagnosis not present

## 2016-09-18 DIAGNOSIS — T82858A Stenosis of vascular prosthetic devices, implants and grafts, initial encounter: Secondary | ICD-10-CM | POA: Diagnosis not present

## 2016-09-18 DIAGNOSIS — N186 End stage renal disease: Secondary | ICD-10-CM | POA: Diagnosis not present

## 2016-09-18 DIAGNOSIS — I871 Compression of vein: Secondary | ICD-10-CM | POA: Diagnosis not present

## 2016-09-19 DIAGNOSIS — N2581 Secondary hyperparathyroidism of renal origin: Secondary | ICD-10-CM | POA: Diagnosis not present

## 2016-09-19 DIAGNOSIS — N186 End stage renal disease: Secondary | ICD-10-CM | POA: Diagnosis not present

## 2016-09-19 DIAGNOSIS — D509 Iron deficiency anemia, unspecified: Secondary | ICD-10-CM | POA: Diagnosis not present

## 2016-09-19 DIAGNOSIS — D631 Anemia in chronic kidney disease: Secondary | ICD-10-CM | POA: Diagnosis not present

## 2016-09-22 DIAGNOSIS — N186 End stage renal disease: Secondary | ICD-10-CM | POA: Diagnosis not present

## 2016-09-22 DIAGNOSIS — D509 Iron deficiency anemia, unspecified: Secondary | ICD-10-CM | POA: Diagnosis not present

## 2016-09-22 DIAGNOSIS — D631 Anemia in chronic kidney disease: Secondary | ICD-10-CM | POA: Diagnosis not present

## 2016-09-22 DIAGNOSIS — N2581 Secondary hyperparathyroidism of renal origin: Secondary | ICD-10-CM | POA: Diagnosis not present

## 2016-09-24 DIAGNOSIS — N2581 Secondary hyperparathyroidism of renal origin: Secondary | ICD-10-CM | POA: Diagnosis not present

## 2016-09-24 DIAGNOSIS — D631 Anemia in chronic kidney disease: Secondary | ICD-10-CM | POA: Diagnosis not present

## 2016-09-24 DIAGNOSIS — N186 End stage renal disease: Secondary | ICD-10-CM | POA: Diagnosis not present

## 2016-09-24 DIAGNOSIS — D509 Iron deficiency anemia, unspecified: Secondary | ICD-10-CM | POA: Diagnosis not present

## 2016-09-25 ENCOUNTER — Ambulatory Visit (INDEPENDENT_AMBULATORY_CARE_PROVIDER_SITE_OTHER): Payer: Medicare Other | Admitting: Vascular Surgery

## 2016-09-25 ENCOUNTER — Encounter: Payer: Self-pay | Admitting: Vascular Surgery

## 2016-09-25 ENCOUNTER — Ambulatory Visit: Payer: Medicare Other | Admitting: Neurology

## 2016-09-25 ENCOUNTER — Other Ambulatory Visit: Payer: Self-pay

## 2016-09-25 ENCOUNTER — Telehealth: Payer: Self-pay | Admitting: Cardiovascular Disease

## 2016-09-25 VITALS — BP 122/86 | HR 80 | Temp 98.4°F | Resp 16 | Ht 72.0 in | Wt 228.0 lb

## 2016-09-25 DIAGNOSIS — N186 End stage renal disease: Secondary | ICD-10-CM | POA: Diagnosis not present

## 2016-09-25 DIAGNOSIS — Z992 Dependence on renal dialysis: Secondary | ICD-10-CM

## 2016-09-25 DIAGNOSIS — I63522 Cerebral infarction due to unspecified occlusion or stenosis of left anterior cerebral artery: Secondary | ICD-10-CM

## 2016-09-25 NOTE — Progress Notes (Signed)
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 He had a brachial vein fistula created by Dr. Imogene Burn in 2016. He has not really had any problems with this until recently. He denies any bleeding episodes. His dialysis schedule is Monday Wednesday Friday.  He is on Plavix and aspirin.  Current Outpatient Prescriptions on File Prior to Visit  Medication Sig Dispense Refill  . acetaminophen (TYLENOL) 500 MG tablet Take 1,000 mg by mouth every 6 (six) hours as needed (pain).    Marland Kitchen amLODipine (NORVASC) 5 MG tablet Take 1 tablet (5 mg total) by mouth daily. 90 tablet 3  . aspirin EC 81 MG EC tablet Take 1 tablet (81 mg total) by mouth daily.    Marland Kitchen atorvastatin (LIPITOR) 20 MG tablet Take 1 tablet (20 mg total) by mouth daily. 30 tablet 1  . cinacalcet (SENSIPAR) 30 MG tablet Take 60 mg by mouth at bedtime. Reported on 07/05/2015    . clopidogrel (PLAVIX) 75 MG tablet Take 1 tablet (75 mg total) by mouth daily. 90 tablet 1  . febuxostat (ULORIC) 40 MG tablet Take 40 mg by mouth daily.     . isosorbide mononitrate (IMDUR) 60 MG 24 hr tablet Take 1/2 tablet twice daily 90 tablet 3  . lanthanum ([name]) 1000 MG chewable tablet Chew 2 tablets (2,000 mg total) by mouth 3 (three) times daily with meals. Yearly physical due in July must see MD for refills 540 tablet 0  . lidocaine-prilocaine (EMLA) cream Apply 1 application topically as needed. Apply as needed for dialysis site    . memantine (NAMENDA) 10 MG tablet Take 1 tablet (10 mg total) by mouth 2 (two) times daily. 60 tablet 11  . metoprolol (LOPRESSOR) 50 MG tablet Take 1 tablet (50 mg total) by mouth 2 (two) times daily. 180 tablet 3  . NITROSTAT 0.4 MG SL tablet PLACE 1 TABLET UNDER TONGUE AS NEEDED FOR CHEST PAIN EVERY 5 MINUTES (MAX 3 DOSES) 25 tablet 4  . terazosin (HYTRIN) 10 MG capsule TAKE 1 CAPSULE BY MOUTH DAILY AT BEDTIME 90 capsule 3  . ticagrelor (BRILINTA) 90  MG TABS tablet Take 90 mg by mouth 2 (two) times daily.    Marland Kitchen zolpidem (AMBIEN) 10 MG tablet 1 tab by mouth at bedtime as needed for sleep 90 tablet 1   No current facility-administered medications on file prior to visit.     Review of systems: He denies any shortness of breath. He denies chest pain.  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 There is a 3-4 mm ulceration over this.  Assessment: Ulceration left arm AV fistula at risk for bleeding.  Plan: Revision of AV fistula by Dr. Imogene Burn on Tuesday, July 17.  Fabienne Bruns, MD [facility] Office: [phone] Pager: [phone]

## 2016-09-25 NOTE — Telephone Encounter (Signed)
° °  [facility] Pre-operative Risk Assessment    Request for surgical clearance:  1. What type of surgery is being performed?Revision of The left Arm Arteriovenous Fistula  2. When is this surgery scheduled?09/30/16  3. Are there any medications that need to be held prior to surgery and how long?Plavix to hold 5 days prior ,starting today    4. Name of physician performing surgery? Dr.Brian Chen  5. What is your office phone and fax number?ph#[phone] fax# [phone]   Lutricia Horsfall 09/25/2016, 10:39 AM  _________________________________________________________________   (provider comments below)

## 2016-09-25 NOTE — Telephone Encounter (Signed)
Routing to Dr. Berry to advise. 

## 2016-09-26 DIAGNOSIS — D509 Iron deficiency anemia, unspecified: Secondary | ICD-10-CM | POA: Diagnosis not present

## 2016-09-26 DIAGNOSIS — N186 End stage renal disease: Secondary | ICD-10-CM | POA: Diagnosis not present

## 2016-09-26 DIAGNOSIS — N2581 Secondary hyperparathyroidism of renal origin: Secondary | ICD-10-CM | POA: Diagnosis not present

## 2016-09-26 DIAGNOSIS — D631 Anemia in chronic kidney disease: Secondary | ICD-10-CM | POA: Diagnosis not present

## 2016-09-28 NOTE — Telephone Encounter (Signed)
OK to interrupt anti platelet Rx for AVF by Dr Imogene Burn

## 2016-09-29 ENCOUNTER — Encounter (HOSPITAL_COMMUNITY): Payer: Self-pay | Admitting: Emergency Medicine

## 2016-09-29 ENCOUNTER — Encounter (HOSPITAL_COMMUNITY): Payer: Self-pay | Admitting: *Deleted

## 2016-09-29 ENCOUNTER — Telehealth: Payer: Self-pay | Admitting: Cardiovascular Disease

## 2016-09-29 DIAGNOSIS — D631 Anemia in chronic kidney disease: Secondary | ICD-10-CM | POA: Diagnosis not present

## 2016-09-29 DIAGNOSIS — D509 Iron deficiency anemia, unspecified: Secondary | ICD-10-CM | POA: Diagnosis not present

## 2016-09-29 DIAGNOSIS — N2581 Secondary hyperparathyroidism of renal origin: Secondary | ICD-10-CM | POA: Diagnosis not present

## 2016-09-29 DIAGNOSIS — N186 End stage renal disease: Secondary | ICD-10-CM | POA: Diagnosis not present

## 2016-09-29 NOTE — Telephone Encounter (Signed)
Please call,question about his medicine. °

## 2016-09-29 NOTE — Progress Notes (Signed)
Anesthesia Chart Review:  Pt is a same day work up.   Pt is a 56 year old male scheduled for revision of L upper arm AV fistula on 09/30/2016 with Leonides Sake, MD  - PCP is Oliver Barre, MD - Cardiologist is Nanetta Batty, MD who is aware of upcoming surgery and gave ok to hold plavix x5 days. Last office visit 07/27/15, 1 year f/u recommended.  - Neurologist is Levert Feinstein, MD  PMH includes:  CAD (s/p CABG 04/01/11; s/p DES to LAD via LIMA graft 07/05/11), CVA (12/29/13), HTN, hyperlipidemia, OSA, ESRD on hemodialysis, anemia, impaired glucose tolerance, seizures (medication induced), cognitive impairment. Never smoker. BMI 31. S/p multiple dialysis access procedures, most recent 12/06/14.   Medications include: Amlodipine, ASA 81 mg, Lipitor, clonidine, Plavix, Imdur, Namenda, metoprolol, Hytrin, Brilinta. Plavix to be held 5 days before surgery. Pt reports he ran out of brilinta 2 weeks ago.  - On review of records, it appears pt was switched to plavix from brilinta about a year ago, but pt reports he has been taking both.  I notified triage RN in Dr. Hazle Coca office of medication discrepancy.   Labs will be obtained DOS.   EKG will be obtained DOS.   Echo 12/31/13:  - Left ventricle: The cavity size was normal. Wall thickness wasincreased in a pattern of severe LVH. There was severe concentrichypertrophy. Systolic function was vigorous. The estimatedejection fraction was in the range of 65% to 70%. Wall motion was normal; there were no regional wall motion abnormalities. Dopplerparameters are consistent with abnormal left ventricularrelaxation (grade 1 diastolic dysfunction). - Aortic valve: There was trivial regurgitation. Valve area (Vmax):4.77 cm^2. - Left atrium: The atrium was moderately to severely dilated. - Right atrium: The atrium was mildly dilated. - Atrial septum: No defect or patent foramen ovale was identified. - Pericardium, extracardiac: A trivial pericardial effusion  wasidentified posterior to the heart. Features were not consistentwith tamponade physiology. - Impressions: No cardiac source of emboli was indentified.  Carotid duplex 12/30/13:  - The vertebral arteries appear patent with antegrade flow. - Findings consistent with 1-39 percent stenosis involving theright internal carotid artery and the left internal carotidartery. - Focal plaque with elevated velocities in left mid CCA suggestmoderate stenosis  Cardiac cath 07/04/11:  - LIMA was a large graft which anastomosed into the mid LAD.  Diffuse 90% stenoses in the LAD just beyond the anastomosis followed by 80% stenosis before a smaller diagonal vessel.  There was 70% stenosis just beyond this diagonal vessel.  The apical portion of the LAD was very small and diminutive and had 90% stenosis and then in the very inferior portion the LAD was diminutive and again was diffusely narrowed. PTCA and stenting of the mid to mid distal LAD from the LIMA anastomosis to beyond the diagonal vessel, the segmental stenoses were reduced to 0% with DES. The apical LAD lesion was reduced from 90-95% to approximately 50% with low level balloon dilatation purposefully not being aggressive.  No dilatation was made to the diffuse disease in the inferior aspect beyond the apex in a very small diffusely diseased distal LAD system supplying the apical inferior wall.  If labs and EKG acceptable DOS, I anticipate pt can proceed as scheduled.   Rica Mast, FNP-BC [facility]/Anesthesiology Phone: [phone] 09/29/2016 1:02 PM

## 2016-09-29 NOTE — Progress Notes (Signed)
Pt denies SOB and chest pain. Pt under the care of Dr. Allyson Sabal, Cardiology. Pt stated that his last dose of both Plavix and Brilinta was " about 3 weeks ago." Pt made aware to stop taking Plavix,vitamins, fish oil and herbal medications. Do not take any NSAIDs ie: Ibuprofen, Advil, Naproxen ( Aleve) , Motrin, BC and Goody Powder. Pt denies having an EKG within the last year but stated that a chest x ray was performed a year ago at [facility]; records requested. Pt verbalized understanding of all pre-op instructions. Anesthesia asked to review pt clearance note.

## 2016-09-29 NOTE — Telephone Encounter (Signed)
Returned call to Marylene Land, NP w/anesthesia Patient is scheduled to have surgery tomorrow and pharm tech reviewed medications and noted that patient takes Brilinta along with Plavix. She states she is unsure of the validity of this, but it needs to be addressed. Of note, patient has not seen MD in >1 year and will need appt with Dr. Allyson Sabal. Will send message to [facility] scheduling pool, Ladona Ridgel, CMA to follow up on.

## 2016-09-30 ENCOUNTER — Ambulatory Visit (HOSPITAL_COMMUNITY)
Admission: RE | Admit: 2016-09-30 | Discharge: 2016-09-30 | Disposition: A | Discharge status: Home / Self Care | Payer: Medicare Other | Source: Ambulatory Visit | Attending: Vascular Surgery | Admitting: Vascular Surgery

## 2016-09-30 ENCOUNTER — Telehealth: Payer: Self-pay | Admitting: Cardiovascular Disease

## 2016-09-30 ENCOUNTER — Encounter (HOSPITAL_COMMUNITY)
Admission: RE | Disposition: A | Discharge status: Home / Self Care | Payer: Self-pay | Source: Ambulatory Visit | Attending: Vascular Surgery

## 2016-09-30 ENCOUNTER — Encounter (HOSPITAL_COMMUNITY): Payer: Self-pay | Admitting: *Deleted

## 2016-09-30 DIAGNOSIS — E785 Hyperlipidemia, unspecified: Secondary | ICD-10-CM | POA: Diagnosis not present

## 2016-09-30 DIAGNOSIS — N186 End stage renal disease: Secondary | ICD-10-CM | POA: Diagnosis not present

## 2016-09-30 DIAGNOSIS — I120 Hypertensive chronic kidney disease with stage 5 chronic kidney disease or end stage renal disease: Secondary | ICD-10-CM | POA: Insufficient documentation

## 2016-09-30 DIAGNOSIS — Z7902 Long term (current) use of antithrombotics/antiplatelets: Secondary | ICD-10-CM | POA: Diagnosis not present

## 2016-09-30 DIAGNOSIS — G3184 Mild cognitive impairment, so stated: Secondary | ICD-10-CM | POA: Diagnosis not present

## 2016-09-30 DIAGNOSIS — R7302 Impaired glucose tolerance (oral): Secondary | ICD-10-CM | POA: Insufficient documentation

## 2016-09-30 DIAGNOSIS — Z5329 Procedure and treatment not carried out because of patient's decision for other reasons: Secondary | ICD-10-CM | POA: Insufficient documentation

## 2016-09-30 DIAGNOSIS — Z992 Dependence on renal dialysis: Secondary | ICD-10-CM | POA: Diagnosis not present

## 2016-09-30 DIAGNOSIS — D649 Anemia, unspecified: Secondary | ICD-10-CM | POA: Diagnosis not present

## 2016-09-30 DIAGNOSIS — I2510 Atherosclerotic heart disease of native coronary artery without angina pectoris: Secondary | ICD-10-CM | POA: Diagnosis not present

## 2016-09-30 DIAGNOSIS — G4733 Obstructive sleep apnea (adult) (pediatric): Secondary | ICD-10-CM | POA: Insufficient documentation

## 2016-09-30 DIAGNOSIS — Z951 Presence of aortocoronary bypass graft: Secondary | ICD-10-CM | POA: Insufficient documentation

## 2016-09-30 DIAGNOSIS — Z8673 Personal history of transient ischemic attack (TIA), and cerebral infarction without residual deficits: Secondary | ICD-10-CM | POA: Diagnosis not present

## 2016-09-30 DIAGNOSIS — Z79899 Other long term (current) drug therapy: Secondary | ICD-10-CM | POA: Insufficient documentation

## 2016-09-30 HISTORY — DX: Heart failure, unspecified: I50.9

## 2016-09-30 LAB — POCT I-STAT 4, (NA,K, GLUC, HGB,HCT)
GLUCOSE: 97 mg/dL (ref 65–99)
HCT: 37 % — ABNORMAL LOW (ref 39.0–52.0)
HEMOGLOBIN: 12.6 g/dL — AB (ref 13.0–17.0)
POTASSIUM: 4.6 mmol/L (ref 3.5–5.1)
SODIUM: 133 mmol/L — AB (ref 135–145)

## 2016-09-30 SURGERY — REVISON OF ARTERIOVENOUS FISTULA
Anesthesia: Monitor Anesthesia Care | Site: Arm Upper | Laterality: Left

## 2016-09-30 MED ORDER — ONDANSETRON HCL 4 MG/2ML IJ SOLN
INTRAMUSCULAR | Status: AC
  Filled 2016-09-30: qty 2

## 2016-09-30 MED ORDER — DEXTROSE 5 % IV SOLN
1.5000 g | INTRAVENOUS | Status: DC
  Filled 2016-09-30: qty 1.5

## 2016-09-30 MED ORDER — SODIUM CHLORIDE 0.9 % IV SOLN: INTRAVENOUS | Status: DC

## 2016-09-30 MED ORDER — EPHEDRINE 5 MG/ML INJ
INTRAVENOUS | Status: AC
  Filled 2016-09-30: qty 10

## 2016-09-30 MED ORDER — FENTANYL CITRATE (PF) 250 MCG/5ML IJ SOLN
INTRAMUSCULAR | Status: AC
  Filled 2016-09-30: qty 5

## 2016-09-30 MED ORDER — PROTAMINE SULFATE 10 MG/ML IV SOLN
INTRAVENOUS | Status: AC
  Filled 2016-09-30: qty 5

## 2016-09-30 MED ORDER — MIDAZOLAM HCL 2 MG/2ML IJ SOLN
INTRAMUSCULAR | Status: AC
  Filled 2016-09-30: qty 2

## 2016-09-30 MED ORDER — LIDOCAINE HCL (CARDIAC) 20 MG/ML IV SOLN
INTRAVENOUS | Status: AC
  Filled 2016-09-30: qty 5

## 2016-09-30 MED ORDER — ARTIFICIAL TEARS OPHTHALMIC OINT
TOPICAL_OINTMENT | OPHTHALMIC | Status: AC
  Filled 2016-09-30: qty 3.5

## 2016-09-30 MED ORDER — PROPOFOL 10 MG/ML IV BOLUS
INTRAVENOUS | Status: AC
  Filled 2016-09-30: qty 20

## 2016-09-30 MED ORDER — HEPARIN SODIUM (PORCINE) 1000 UNIT/ML IJ SOLN
INTRAMUSCULAR | Status: AC
  Filled 2016-09-30: qty 1

## 2016-09-30 MED ORDER — CHLORHEXIDINE GLUCONATE CLOTH 2 % EX PADS: 6.0000 | MEDICATED_PAD | Freq: Once | CUTANEOUS | Status: DC

## 2016-09-30 NOTE — Progress Notes (Signed)
Pt spoke with Dr. Imogene Burn, surgery cancelled for today. Patient left without escort.

## 2016-09-30 NOTE — Telephone Encounter (Signed)
New message    Pt c/o medication issue:  1. Name of Medication: ticagrelor (BRILINTA) 90 MG TABS tablet  2. How are you currently taking this medication (dosage and times per day)? 90mg   3. Are you having a reaction (difficulty breathing--STAT)? no  4. What is your medication issue? Should pt still be taking brilinta or another blood thinner?

## 2016-09-30 NOTE — Telephone Encounter (Signed)
Routed to # provided via Epic 

## 2016-09-30 NOTE — Telephone Encounter (Signed)
Cardiologist decision.

## 2016-09-30 NOTE — Progress Notes (Signed)
   Daily Progress Note   Pt's prior scab overlying AVF fell off yesterday.  The underlying skin is intact without obvious ulcer into AVF.  The patient has had no bleeding complication.  I discussed the options at this point and the patient elects to defer surgery at this time.  We discussed that any bleeding from the area in question will need likely plication of the underlying small pseudoaneurysm.  - Gave pt instructions in the event of any bleeding - Avoid use of traumatized segment for 2 weeks - Canceling case this AM   Leonides Sake, MD, FACS [facility] Office: [phone] Pager: [phone]  09/30/2016, 7:06 AM

## 2016-10-01 DIAGNOSIS — D509 Iron deficiency anemia, unspecified: Secondary | ICD-10-CM | POA: Diagnosis not present

## 2016-10-01 DIAGNOSIS — N2581 Secondary hyperparathyroidism of renal origin: Secondary | ICD-10-CM | POA: Diagnosis not present

## 2016-10-01 DIAGNOSIS — N186 End stage renal disease: Secondary | ICD-10-CM | POA: Diagnosis not present

## 2016-10-01 DIAGNOSIS — D631 Anemia in chronic kidney disease: Secondary | ICD-10-CM | POA: Diagnosis not present

## 2016-10-02 MED ORDER — AMLODIPINE BESYLATE 5 MG PO TABS: 5.0000 mg | ORAL_TABLET | Freq: Every day | ORAL | 1 refills | Status: DC

## 2016-10-02 MED ORDER — CLOPIDOGREL BISULFATE 75 MG PO TABS: 75.0000 mg | ORAL_TABLET | Freq: Every day | ORAL | 0 refills | Status: DC

## 2016-10-02 MED ORDER — CLONIDINE HCL 0.2 MG PO TABS: 0.2000 mg | ORAL_TABLET | Freq: Two times a day (BID) | ORAL | 1 refills | Status: DC

## 2016-10-02 NOTE — Telephone Encounter (Signed)
spoke with Jessie Foot (sister). She states that she does not know if pt is taking Brilinta or Plavix. Pt is currently only taking ASA 81 mg. She states that she does not know for how long he has not been taking these medications she states that pt has "memory issues" and she suspects that he has not been taking for at least 3 weeks.

## 2016-10-02 NOTE — Telephone Encounter (Signed)
Appt scheduled 8/21 with Dr. Allyson Sabal.

## 2016-10-02 NOTE — Telephone Encounter (Signed)
Pt still waiting to find out what to do about his blood thinners.

## 2016-10-02 NOTE — Telephone Encounter (Signed)
Spoke with Dr Herbie Baltimore he states that pt should restart Plavix daily until upcoming appt then discuss at that time.  Spoke with Jessie Foot (sister) she states that she will have pt restart Plavix today and will discuss with Dr Allyson Sabal at upcoming appt

## 2016-10-03 DIAGNOSIS — D631 Anemia in chronic kidney disease: Secondary | ICD-10-CM | POA: Diagnosis not present

## 2016-10-03 DIAGNOSIS — N186 End stage renal disease: Secondary | ICD-10-CM | POA: Diagnosis not present

## 2016-10-03 DIAGNOSIS — N2581 Secondary hyperparathyroidism of renal origin: Secondary | ICD-10-CM | POA: Diagnosis not present

## 2016-10-03 DIAGNOSIS — D509 Iron deficiency anemia, unspecified: Secondary | ICD-10-CM | POA: Diagnosis not present

## 2016-10-06 DIAGNOSIS — D509 Iron deficiency anemia, unspecified: Secondary | ICD-10-CM | POA: Diagnosis not present

## 2016-10-06 DIAGNOSIS — D631 Anemia in chronic kidney disease: Secondary | ICD-10-CM | POA: Diagnosis not present

## 2016-10-06 DIAGNOSIS — N186 End stage renal disease: Secondary | ICD-10-CM | POA: Diagnosis not present

## 2016-10-06 DIAGNOSIS — N2581 Secondary hyperparathyroidism of renal origin: Secondary | ICD-10-CM | POA: Diagnosis not present

## 2016-10-08 DIAGNOSIS — N186 End stage renal disease: Secondary | ICD-10-CM | POA: Diagnosis not present

## 2016-10-08 DIAGNOSIS — D509 Iron deficiency anemia, unspecified: Secondary | ICD-10-CM | POA: Diagnosis not present

## 2016-10-08 DIAGNOSIS — N2581 Secondary hyperparathyroidism of renal origin: Secondary | ICD-10-CM | POA: Diagnosis not present

## 2016-10-08 DIAGNOSIS — D631 Anemia in chronic kidney disease: Secondary | ICD-10-CM | POA: Diagnosis not present

## 2016-10-10 DIAGNOSIS — D509 Iron deficiency anemia, unspecified: Secondary | ICD-10-CM | POA: Diagnosis not present

## 2016-10-10 DIAGNOSIS — D631 Anemia in chronic kidney disease: Secondary | ICD-10-CM | POA: Diagnosis not present

## 2016-10-10 DIAGNOSIS — N2581 Secondary hyperparathyroidism of renal origin: Secondary | ICD-10-CM | POA: Diagnosis not present

## 2016-10-10 DIAGNOSIS — N186 End stage renal disease: Secondary | ICD-10-CM | POA: Diagnosis not present

## 2016-10-13 DIAGNOSIS — N2581 Secondary hyperparathyroidism of renal origin: Secondary | ICD-10-CM | POA: Diagnosis not present

## 2016-10-13 DIAGNOSIS — D509 Iron deficiency anemia, unspecified: Secondary | ICD-10-CM | POA: Diagnosis not present

## 2016-10-13 DIAGNOSIS — D631 Anemia in chronic kidney disease: Secondary | ICD-10-CM | POA: Diagnosis not present

## 2016-10-13 DIAGNOSIS — N186 End stage renal disease: Secondary | ICD-10-CM | POA: Diagnosis not present

## 2016-10-14 ENCOUNTER — Ambulatory Visit (INDEPENDENT_AMBULATORY_CARE_PROVIDER_SITE_OTHER): Payer: Medicare Other | Admitting: Neurology

## 2016-10-14 ENCOUNTER — Encounter: Payer: Self-pay | Admitting: Neurology

## 2016-10-14 VITALS — BP 102/66 | HR 58 | Ht 72.0 in | Wt 225.5 lb

## 2016-10-14 DIAGNOSIS — R269 Unspecified abnormalities of gait and mobility: Secondary | ICD-10-CM

## 2016-10-14 DIAGNOSIS — G3184 Mild cognitive impairment, so stated: Secondary | ICD-10-CM

## 2016-10-14 DIAGNOSIS — I120 Hypertensive chronic kidney disease with stage 5 chronic kidney disease or end stage renal disease: Secondary | ICD-10-CM | POA: Diagnosis not present

## 2016-10-14 DIAGNOSIS — I63522 Cerebral infarction due to unspecified occlusion or stenosis of left anterior cerebral artery: Secondary | ICD-10-CM | POA: Diagnosis not present

## 2016-10-14 DIAGNOSIS — Z992 Dependence on renal dialysis: Secondary | ICD-10-CM | POA: Diagnosis not present

## 2016-10-14 DIAGNOSIS — I679 Cerebrovascular disease, unspecified: Secondary | ICD-10-CM | POA: Diagnosis not present

## 2016-10-14 DIAGNOSIS — N186 End stage renal disease: Secondary | ICD-10-CM | POA: Diagnosis not present

## 2016-10-14 MED ORDER — CLOPIDOGREL BISULFATE 75 MG PO TABS: 75.0000 mg | ORAL_TABLET | Freq: Every day | ORAL | 4 refills | Status: DC

## 2016-10-14 MED ORDER — ATORVASTATIN CALCIUM 20 MG PO TABS: 20.0000 mg | ORAL_TABLET | Freq: Every day | ORAL | 4 refills | Status: DC

## 2016-10-14 NOTE — Progress Notes (Signed)
PATIENT: [name] DOB: 05-Feb-1961  No chief complaint on file.    HISTORICAL  [name] is a 56 years old right-handed male, accompanied by his sister [name], seen in refer by his primary care physician Dr. Cathlean Cower for evaluation of memory loss on December 11 2015,  I have seen him previously in November 2015  He had past medical history of end-stage renal disease, started dialysis in August 2015, he received dialysis in Tuesday, Thursday, Saturday, through the left upper extremity AV fistula.  He had a history of  Stroke, MRI of the brain in November 2013 showed chronic ischemic changes in periventricular white matters, chronic hemorrhagic infarction in the left putamen, anterior limb of internal capsule, chronic hemorrhagic infarction of right temporoparietal lobe. Chronic hemorrhagic infarction in the left paracentral pontine has progressed, basal ganglion, thalamus, chronic hemorrhagic infarction in the right frontal lobe.  Echocardiogram in 2013 showed mild concentric left ventricular hypertrophy, ejection fraction was 35%, moderate to severe septal hypokinesia, moderate apical wall hypokinesis.  He was admitted to the hospital in October 2015 for acute onset of left-sided weakness,,MRI of the brain showed Acute infarct head of the caudate on the left with multiple lacunar stroke at BG and pons. Infarct felt secondary to severer atherosclerotic disease aggravated by hypotension. His weakness on the left did not correlate with the acute stroke, but more consistent with the recrudescence of his old lacunar stroke. New acute stroke likely silent stroke caused by hypotension. MRA Severe intracranial atherosclerotic disease, especially b/l MCAs. Carotid Doppler Preliminary report: Bilateral: 1-39% ICA stenosis. Vertebral artery flow is antegrade. 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  Patient tends to minimize his difficulty, sister reported that patient has become forgetful, tends to repeat himself, overspend, forgot to pay his bill, his light water has been cut off few times, his sister want his cognitive function to be fully evaluated, to facilitate the process of power of attorney assignment, his sister is now managing his finances, he also is much less motivated, does not cook, cleaning, no longer keep up his exercise routine, easily agitated, is depressed  I reviewed the laboratory evaluation in August 2017, LDL was elevated 166, cholesterol was 274, triglycerides right was 378, A1c was 4.9, PSA was elevated 5.82, normal TSH 1.15, normal CBC, with hemoglobin of 11 point 9, normal liver functional tests, BMP showed mildly decreased potassium 3.4, elevated creatinine 5.57, glucose was 104, RPR was negative  We have personally reviewed his most recent MRI of the brain in 2015, acute infarction in the left head of caudate, moderate supratentorium small vessel disease, severe intracranial atherosclerotic disease  Laboratory evaluation in August 2017, elevated LDL 166, elevated total cholesterol 274, triglycerides 378, A1c 4.9, negative hepatitis C antibody, normal TSH 1.15  UPDATE Jan 4th 2018: He had neuropsychiatric testing in Nov 2017 by Dr. Bonita Quin, He still [facility], he drove to HD himself, rest of times, he stayed at home, with a roommate, he felt lost,  Watches TV, goes to sleep,   " Results of this evaluation  revealed deficits in auditory attention and working memory, verbal fluency, mental flexibility and set-shifting and deductive reasoning. Processing speed  was variable, impaired at times. In addition to cognitive deficits demonstrated on objective testing, there is evidence that the patient's cognitive deficits are interfering with his ability to perform instrumental ADLs such as management of his finances and appointments. 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  The patient's history of multiple CVAs, with cognitive decline following each one, in combination with his cognitive profile reflecting frontal-subcortical dysfunction, are all in line with vascular etiology.  Given the significant number of strokes and other vascular risk factors, he is fortunate to maintain a number of cognitive strengths, including learning and memory, language comprehension, and naming abilities. Also, at this time, there is no evidence to suggest a superimposed early onset Alzheimer's disease. "  EEG was normal in October 2017.  UPDATE October 14 2016: He drove to clinic by himself today, ambulate with a cane, complains of left leg weakness, Mini-Mental Status Examination 26/30, difficulty memory,  We have personally reviewed MRI of the brain in February 2018: Chronic stroke involving head of left caudate, also had the right posterolateral temporal lobe adjacent parietal lobe, chronic lacunar infarction in the left pons, right corpus callosum, right periventricular white matter, thalamus, moderate small vessel disease, mild to moderate generalized atrophy, no acute abnormality  He has run out of his blood pressure medicine, hyperlipidemia medicine Plavix, will have follow-up with his primary care physician Dr. Cathlean Cower November 04 2016  EEG was normal in October 2017.  REVIEW OF SYSTEMS: Full 14 system review of systems performed and notable only for memory loss  ALLERGIES: Allergies  Allergen Reactions  . Shrimp [Shellfish Allergy] Shortness Of Breath  . Atorvastatin Other (See Comments)    weakness  . Insulins Other (See Comments)     Patient unsure as to the kind of insulin but states that it has previously caused seizures.  Most recent hospitalization (Jan 2013) received insulin but did not have reaction. LIKELY DUE TO SIGNIFICANT HYPOGLYCEMIA  . Simvastatin Other (See Comments)     abnormal liver tests  . Ultram [Tramadol] Other (See Comments)    Liver enzyme     HOME MEDICATIONS: Current Outpatient Prescriptions  Medication Sig Dispense Refill  . acetaminophen (TYLENOL) 500 MG tablet Take 1,000 mg by mouth 2 (two) times daily as needed for mild pain.     Marland Kitchen amLODipine (NORVASC) 5 MG tablet Take 1 tablet (5 mg total) by mouth daily. 30 tablet 1  . aspirin EC 81 MG EC tablet Take 1 tablet (81 mg total) by mouth daily.    Marland Kitchen atorvastatin (LIPITOR) 20 MG tablet Take 1 tablet (20 mg total) by mouth daily. 30 tablet 1  . cinacalcet (SENSIPAR) 90 MG tablet Take 90 mg by mouth at bedtime.    . cloNIDine (CATAPRES) 0.2 MG tablet Take 1 tablet (0.2 mg total) by mouth 2 (two) times daily. 30 tablet 1  . clopidogrel (PLAVIX) 75 MG tablet Take 1 tablet (75 mg total) by mouth daily. 35 tablet 0  . febuxostat (ULORIC) 40 MG tablet Take 40 mg by mouth daily as needed (gout).     . isosorbide mononitrate (IMDUR) 60 MG 24 hr tablet Take 1/2 tablet twice daily (Patient not taking: Reported on 09/26/2016) 90 tablet 3  . isosorbide mononitrate (ISMO,MONOKET) 20 MG tablet Take 20 mg by mouth daily.    Marland Kitchen lanthanum ([name]) 1000 MG chewable tablet Chew  2 tablets (2,000 mg total) by mouth 3 (three) times daily with meals. Yearly physical due in July must see MD for refills 540 tablet 0  . lidocaine-prilocaine (EMLA) cream Apply 1 application topically as needed. Apply as needed for dialysis site    . memantine (NAMENDA) 10 MG tablet Take 1 tablet (10 mg total) by mouth 2 (two) times daily. 60 tablet 11  . metoprolol (LOPRESSOR) 50 MG tablet Take 1 tablet (50 mg total) by mouth 2 (two) times daily. 180 tablet 3  . NITROSTAT 0.4 MG SL tablet  PLACE 1 TABLET UNDER TONGUE AS NEEDED FOR CHEST PAIN EVERY 5 MINUTES (MAX 3 DOSES) 25 tablet 4  . terazosin (HYTRIN) 10 MG capsule TAKE 1 CAPSULE BY MOUTH DAILY AT BEDTIME (Patient taking differently: Take 10 mg by mouth at bedtime. TAKE 1 CAPSULE BY MOUTH DAILY AT BEDTIME) 90 capsule 3  . ticagrelor (BRILINTA) 90 MG TABS tablet Take 90 mg by mouth 2 (two) times daily.    Marland Kitchen zolpidem (AMBIEN) 10 MG tablet 1 tab by mouth at bedtime as needed for sleep 90 tablet 1   No current facility-administered medications for this visit.     PAST MEDICAL HISTORY: Past Medical History:  Diagnosis Date  . ACHALASIA   . ANEMIA-NOS   . CAD (coronary artery disease), 3 vessel significant disease.    . CEREBROVASCULAR ACCIDENT, HX OF   . CHF (congestive heart failure) ([place])   . CKD (chronic kidney disease) stage 4, GFR 15-29 ml/min (H[facility])   . Constipation   . DEGENERATIVE JOINT DISEASE, SHOULDER    knee  . DEPRESSION    2006- not depressed any longer  . Family history of adverse reaction to anesthesia    Mother, hard to awaken  . GERD    no meds required  . Gout    takes Uloric daily  . H/O hiatal hernia   . Headache(784.0)   . Heart attack ([place])   . History of colon polyps   . History of diastolic dysfunction, grade 2 by echo   . HYPERLIPIDEMIA    takes crestor daily  . Hyperlipidemia   . HYPERTENSION    takes Norvasc,Catapress,Hydralazine,Imdur,and Metoprolol daily  . Impaired glucose tolerance   . Insomnia    takes Ambien nightly  . NSTEMI (non-ST elevated myocardial infarction), 06/30/11 07/01/2011  . RENAL CALCULUS, HX OF   . RENAL INSUFFICIENCY    12/2013- TThSat  . S/P coronary artery stent placement, PTCA/DES Resolute to mid-LAD via the LIMA graft, and PTCA of apical 95% stenosis 07/05/11 07/04/2011  . S/P PTCA (percutaneous transluminal coronary angioplasty), 06/30/11 07/01/2011  . Seizures (H[facility])    INSULIN INDUCED  . Sleep apnea    SLEEP STUDY IN [place] , NO MACHINE YET  1 YEAR  .  Stroke ([place])    93/2005/2006/2007;left sided weakness.  1993 12/2013    PAST SURGICAL HISTORY: Past Surgical History:  Procedure Laterality Date  . ARCH AORTOGRAM N/A 02/19/2012   Procedure: ARCH AORTOGRAM;  Surgeon: Conrad [place], MD;  [facility] C[facility] LAB;  Service: Cardiovascular;  Laterality: N/A;  . AV FISTULA PLACEMENT  12/02/2011   Procedure: ARTERIOVENOUS (AV) FISTULA CREATION;  Surgeon: Conrad [place], MD;  Location: [place];  Service: Vascular;  Laterality: Left;  Ultrasound Guided  . AV FISTULA PLACEMENT Left 06/08/2012   Procedure: ARTERIOVENOUS (AV) FISTULA CREATION;  Surgeon: Conrad Malakoff, MD;  Location: [place];  Service: Vascular;  Laterality: Left;  . BASCILIC VEIN TRANSPOSITION  Left 09/06/2014   Procedure: FIRST STAGE BASILIC VEIN TRANSPOSITION ;  Surgeon: Conrad [place], MD;  Location: [place];  Service: Vascular;  Laterality: Left;  . BASCILIC VEIN TRANSPOSITION Left 12/06/2014   Procedure: SECOND STAGE BRACHIAL VEIN TRANSPOSITION ;  Surgeon: Conrad [place], MD;  Location: [place];  Service: Vascular;  Laterality: Left;  . CARDIAC CATHETERIZATION    . COLONOSCOPY    . CORONARY ANGIOPLASTY     06/30/11   AT [facility]  . CORONARY ARTERY BYPASS GRAFT  04/01/2011   Procedure: CORONARY ARTERY BYPASS GRAFTING (CABG);  Surgeon: Tharon Aquas Adelene Idler, MD;  Location: [place];  Service: Open Heart Surgery;  Laterality: N/A;  . HERNIA REPAIR  2013  . INSERTION OF DIALYSIS CATHETER  04/01/2011   Procedure: INSERTION OF DIALYSIS CATHETER;  Surgeon: Hinda Lenis, MD;  Location: [place];  Service: Vascular;  Laterality: N/A;  . KNEE SURGERY Right 01/07/2011   arthroscopy  . LEFT HEART CATHETERIZATION WITH CORONARY ANGIOGRAM N/A 03/28/2011   Procedure: LEFT HEART CATHETERIZATION WITH CORONARY ANGIOGRAM;  Surgeon: Troy Sine, MD;  Location: [facility] CATH LAB;  Service: Cardiovascular;  Laterality: N/A;  pending creatnine  . LEFT HEART CATHETERIZATION WITH CORONARY/GRAFT ANGIOGRAM N/A 06/30/2011   Procedure: LEFT  HEART CATHETERIZATION WITH Beatrix Fetters;  Surgeon: Sanda Klein, MD;  Location: [place] CATH LAB;  Service: Cardiovascular;  Laterality: N/A;  . LIGATION OF ARTERIOVENOUS  FISTULA Left 06/08/2012   Procedure: LIGATION OF ARTERIOVENOUS  FISTULA;  Surgeon: Conrad [place], MD;  Location: [place];  Service: Vascular;  Laterality: Left;  . NM MYOCAR PERF EJECTION FRACTION  06/25/2011   There is evidence of mild ischemia in the basal inferolateral and mid inferolateral regions. (Extent 7 %) The post-stress ejection fraction is 44%. There is mild hypocontractility in the distal inferoapical segment. baseline T wave inversion is noted in leads I, avL, II, V2. This is a low risk scan. The present perfusion study suggests significant myocardial salvage with mild residual ischemia .  Marland Kitchen PERCUTANEOUS CORONARY INTERVENTION-BALLOON ONLY  06/30/2011   Procedure: PERCUTANEOUS CORONARY INTERVENTION-BALLOON ONLY;  Surgeon: Sanda Klein, MD;  Location: [place] CATH LAB;  Service: Cardiovascular;;  . PERCUTANEOUS CORONARY STENT INTERVENTION (PCI-S) N/A 07/04/2011   Procedure: PERCUTANEOUS CORONARY STENT INTERVENTION (PCI-S);  Surgeon: Troy Sine, MD;  Location: [facility] CATH LAB;  Service: Cardiovascular;  Laterality: N/A;  . REVISION OF ARTERIOVENOUS GORETEX GRAFT Left 12/06/2014   Procedure: REVISION OF ARTERIOVENOUS GORETEX GRAFT USING 75mx10cm;  Surgeon: BConrad [place] MD;  [place]  Service: Vascular;  Laterality: Left;  . STOMACH SURGERY  2009   achalasia  . SUBCLAVIAN STENT PLACEMENT  12/20/2011  . TONSILLECTOMY    . TONSILLECTOMY      FAMILY HISTORY: Family History  Problem Relation Age of Onset  . Cancer Mother   . Heart disease Mother   . Hyperlipidemia Mother   . Hypertension Mother   . Alzheimer's disease Mother   . Cancer Father   . Heart disease Father   . Hypertension Other   . Stroke Other   . Hyperlipidemia Other     SOCIAL HISTORY:  Social History   Social History  . Marital  status: Divorced    Spouse name: N/A  . Number of children: 1  . Years of education: Bachelors   Occupational History  . Disabled    Social History Main Topics  . Smoking status: Never Smoker  . Smokeless tobacco: Former [employer] developer  Types: Sarina Ser    Quit date: 03/18/2003     Comment: chewed tobacco   . Alcohol use 1.2 oz/week    2 Shots of liquor per week     Comment: rarely; beer  . Drug use: No  . Sexual activity: Not Currently    Partners: Male   Other Topics Concern  . Not on file   Social History Narrative   Lives at home alone.   Right-handed.   Occasional caffeine use.     PHYSICAL EXAM   There were no vitals filed for this visit.  Not recorded      There is no height or weight on file to calculate BMI.  PHYSICAL EXAMNIATION:  Gen: NAD, conversant, well nourised, obese, well groomed                     Cardiovascular: Regular rate rhythm, no peripheral edema, warm, nontender. Eyes: Conjunctivae clear without exudates or hemorrhage Neck: Supple, no carotid bruise. Pulmonary: Clear to auscultation bilaterally   NEUROLOGICAL EXAM: MMSE - Mini Mental State Exam 10/14/2016 03/20/2016 12/11/2015  Not completed: - - -  Orientation to time '4 5 5  '$ Orientation to Place '5 5 5  '$ Registration '3 3 3  '$ Attention/ Calculation '2 5 5  '$ Recall '3 3 3  '$ Language- name 2 objects '2 2 2  '$ Language- repeat '1 1 1  '$ Language- follow 3 step command '3 3 3  '$ Language- read & follow direction '1 1 1  '$ Write a sentence '1 1 1  '$ Copy design '1 1 1  '$ Total score '26 30 30  '$ animal naming 10   CRANIAL NERVES: CN II: Visual fields are full to confrontation. Fundoscopic exam is normal with sharp discs and no vascular changes. Pupils are round equal and briskly reactive to light. CN III, IV, VI: extraocular movement are normal. No ptosis. CN V: Facial sensation is intact to pinprick in all 3 divisions bilaterally. Corneal responses are intact.  CN VII: Face is symmetric with normal eye closure and  smile. CN VIII: Hearing is normal to rubbing fingers CN IX, X: Palate elevates symmetrically. Phonation is normal. CN XI: Head turning and shoulder shrug are intact CN XII: Tongue is midline with normal movements and no atrophy.  MOTOR: Mild left leg proximal weakness, normal tone  REFLEXES: Reflexes are 2+ and symmetric at the biceps, triceps, knees, and ankles. Plantar responses are flexor.  SENSORY: Intact to light touch, pinprick, positional sensation and vibratory sensation are intact in fingers and toes.  COORDINATION: Rapid alternating movements and fine finger movements are intact. There is no dysmetria on finger-to-nose and heel-knee-shin.    GAIT/STANCE: Need to push up to get up from seated position, dragging his left leg, relies on his cane.   DIAGNOSTIC DATA (LABS, IMAGING, TESTING) - I reviewed patient records, labs, notes, testing and imaging myself where available.   ASSESSMENT AND PLAN  [name] is a 56 y.o. male   Cognitive impairment  Multifactorial, stroke, depression also contribute  MRI showed premature atrophy, multiple previous stroke, including hemorrhagic stroke at left caudate,   Current cognitive impairment is most likely related to his previous vascular event.  I have suggested him complying with his medications, continue follow-up with his primary care physician  Complicated past medical history, including stroke, and end-stage renal disease on hemodialysis   Marcial Pacas, M.D. Ph.D.  [facility] [address] [place], [place] [address] Ph: [phone] Fax: [phone]  CC: Biagio Borg, MD

## 2016-10-15 DIAGNOSIS — N2581 Secondary hyperparathyroidism of renal origin: Secondary | ICD-10-CM | POA: Diagnosis not present

## 2016-10-15 DIAGNOSIS — D631 Anemia in chronic kidney disease: Secondary | ICD-10-CM | POA: Diagnosis not present

## 2016-10-15 DIAGNOSIS — N186 End stage renal disease: Secondary | ICD-10-CM | POA: Diagnosis not present

## 2016-10-17 DIAGNOSIS — N2581 Secondary hyperparathyroidism of renal origin: Secondary | ICD-10-CM | POA: Diagnosis not present

## 2016-10-17 DIAGNOSIS — D631 Anemia in chronic kidney disease: Secondary | ICD-10-CM | POA: Diagnosis not present

## 2016-10-17 DIAGNOSIS — N186 End stage renal disease: Secondary | ICD-10-CM | POA: Diagnosis not present

## 2016-10-20 DIAGNOSIS — D631 Anemia in chronic kidney disease: Secondary | ICD-10-CM | POA: Diagnosis not present

## 2016-10-20 DIAGNOSIS — N186 End stage renal disease: Secondary | ICD-10-CM | POA: Diagnosis not present

## 2016-10-20 DIAGNOSIS — N2581 Secondary hyperparathyroidism of renal origin: Secondary | ICD-10-CM | POA: Diagnosis not present

## 2016-10-22 DIAGNOSIS — N186 End stage renal disease: Secondary | ICD-10-CM | POA: Diagnosis not present

## 2016-10-22 DIAGNOSIS — N2581 Secondary hyperparathyroidism of renal origin: Secondary | ICD-10-CM | POA: Diagnosis not present

## 2016-10-22 DIAGNOSIS — D631 Anemia in chronic kidney disease: Secondary | ICD-10-CM | POA: Diagnosis not present

## 2016-10-24 DIAGNOSIS — N2581 Secondary hyperparathyroidism of renal origin: Secondary | ICD-10-CM | POA: Diagnosis not present

## 2016-10-24 DIAGNOSIS — D631 Anemia in chronic kidney disease: Secondary | ICD-10-CM | POA: Diagnosis not present

## 2016-10-24 DIAGNOSIS — N186 End stage renal disease: Secondary | ICD-10-CM | POA: Diagnosis not present

## 2016-10-27 DIAGNOSIS — D631 Anemia in chronic kidney disease: Secondary | ICD-10-CM | POA: Diagnosis not present

## 2016-10-27 DIAGNOSIS — N186 End stage renal disease: Secondary | ICD-10-CM | POA: Diagnosis not present

## 2016-10-27 DIAGNOSIS — N2581 Secondary hyperparathyroidism of renal origin: Secondary | ICD-10-CM | POA: Diagnosis not present

## 2016-10-29 DIAGNOSIS — D631 Anemia in chronic kidney disease: Secondary | ICD-10-CM | POA: Diagnosis not present

## 2016-10-29 DIAGNOSIS — N186 End stage renal disease: Secondary | ICD-10-CM | POA: Diagnosis not present

## 2016-10-29 DIAGNOSIS — N2581 Secondary hyperparathyroidism of renal origin: Secondary | ICD-10-CM | POA: Diagnosis not present

## 2016-10-31 DIAGNOSIS — N186 End stage renal disease: Secondary | ICD-10-CM | POA: Diagnosis not present

## 2016-10-31 DIAGNOSIS — N2581 Secondary hyperparathyroidism of renal origin: Secondary | ICD-10-CM | POA: Diagnosis not present

## 2016-10-31 DIAGNOSIS — D631 Anemia in chronic kidney disease: Secondary | ICD-10-CM | POA: Diagnosis not present

## 2016-11-03 DIAGNOSIS — N2581 Secondary hyperparathyroidism of renal origin: Secondary | ICD-10-CM | POA: Diagnosis not present

## 2016-11-03 DIAGNOSIS — N186 End stage renal disease: Secondary | ICD-10-CM | POA: Diagnosis not present

## 2016-11-03 DIAGNOSIS — D631 Anemia in chronic kidney disease: Secondary | ICD-10-CM | POA: Diagnosis not present

## 2016-11-04 ENCOUNTER — Encounter: Payer: Self-pay | Admitting: Internal Medicine

## 2016-11-04 ENCOUNTER — Telehealth: Payer: Self-pay | Admitting: Internal Medicine

## 2016-11-04 ENCOUNTER — Ambulatory Visit (INDEPENDENT_AMBULATORY_CARE_PROVIDER_SITE_OTHER): Payer: Medicare Other | Admitting: Cardiovascular Disease

## 2016-11-04 ENCOUNTER — Ambulatory Visit (INDEPENDENT_AMBULATORY_CARE_PROVIDER_SITE_OTHER): Payer: Medicare Other | Admitting: Internal Medicine

## 2016-11-04 ENCOUNTER — Other Ambulatory Visit (INDEPENDENT_AMBULATORY_CARE_PROVIDER_SITE_OTHER): Payer: Medicare Other

## 2016-11-04 ENCOUNTER — Encounter: Payer: Self-pay | Admitting: Cardiovascular Disease

## 2016-11-04 VITALS — BP 126/78 | HR 67 | Temp 98.3°F | Ht 72.0 in | Wt 225.0 lb

## 2016-11-04 VITALS — BP 109/71 | HR 78 | Ht 72.0 in | Wt 226.0 lb

## 2016-11-04 DIAGNOSIS — R7302 Impaired glucose tolerance (oral): Secondary | ICD-10-CM | POA: Diagnosis not present

## 2016-11-04 DIAGNOSIS — I10 Essential (primary) hypertension: Secondary | ICD-10-CM

## 2016-11-04 DIAGNOSIS — I63522 Cerebral infarction due to unspecified occlusion or stenosis of left anterior cerebral artery: Secondary | ICD-10-CM

## 2016-11-04 DIAGNOSIS — E785 Hyperlipidemia, unspecified: Secondary | ICD-10-CM

## 2016-11-04 DIAGNOSIS — R972 Elevated prostate specific antigen [PSA]: Secondary | ICD-10-CM | POA: Insufficient documentation

## 2016-11-04 DIAGNOSIS — Z1211 Encounter for screening for malignant neoplasm of colon: Secondary | ICD-10-CM | POA: Diagnosis not present

## 2016-11-04 DIAGNOSIS — E7800 Pure hypercholesterolemia, unspecified: Secondary | ICD-10-CM | POA: Diagnosis not present

## 2016-11-04 DIAGNOSIS — I2510 Atherosclerotic heart disease of native coronary artery without angina pectoris: Secondary | ICD-10-CM

## 2016-11-04 LAB — HEMOGLOBIN A1C: Hgb A1c MFr Bld: 5.3 % (ref 4.6–6.5)

## 2016-11-04 LAB — CBC WITH DIFFERENTIAL/PLATELET
Basophils Absolute: 0.1 10*3/uL (ref 0.0–0.1)
Basophils Relative: 1.4 % (ref 0.0–3.0)
EOS PCT: 1.1 % (ref 0.0–5.0)
Eosinophils Absolute: 0.1 10*3/uL (ref 0.0–0.7)
HCT: 42.9 % (ref 39.0–52.0)
Hemoglobin: 14.2 g/dL (ref 13.0–17.0)
LYMPHS ABS: 1.4 10*3/uL (ref 0.7–4.0)
Lymphocytes Relative: 22.1 % (ref 12.0–46.0)
MCHC: 33 g/dL (ref 30.0–36.0)
MCV: 99.3 fl (ref 78.0–100.0)
MONO ABS: 0.6 10*3/uL (ref 0.1–1.0)
Monocytes Relative: 9.9 % (ref 3.0–12.0)
NEUTROS PCT: 65.5 % (ref 43.0–77.0)
Neutro Abs: 4.2 10*3/uL (ref 1.4–7.7)
Platelets: 259 10*3/uL (ref 150.0–400.0)
RBC: 4.32 Mil/uL (ref 4.22–5.81)
RDW: 16.5 % — AB (ref 11.5–15.5)
WBC: 6.5 10*3/uL (ref 4.0–10.5)

## 2016-11-04 LAB — HEPATIC FUNCTION PANEL
ALBUMIN: 4.2 g/dL (ref 3.5–5.2)
ALK PHOS: 63 U/L (ref 39–117)
ALT: 11 U/L (ref 0–53)
AST: 13 U/L (ref 0–37)
Bilirubin, Direct: 0.1 mg/dL (ref 0.0–0.3)
TOTAL PROTEIN: 7.4 g/dL (ref 6.0–8.3)
Total Bilirubin: 0.6 mg/dL (ref 0.2–1.2)

## 2016-11-04 LAB — LIPID PANEL
CHOL/HDL RATIO: 6
CHOLESTEROL: 236 mg/dL — AB (ref 0–200)
HDL: 37.7 mg/dL — AB (ref >39.00)
NONHDL: 197.81
TRIGLYCERIDES: 287 mg/dL — AB (ref 0.0–149.0)
VLDL: 57.4 mg/dL — AB (ref 0.0–40.0)

## 2016-11-04 LAB — BASIC METABOLIC PANEL
BUN: 33 mg/dL — ABNORMAL HIGH (ref 6–23)
CHLORIDE: 91 meq/L — AB (ref 96–112)
CO2: 28 meq/L (ref 19–32)
Calcium: 9.9 mg/dL (ref 8.4–10.5)
Creatinine, Ser: 10.53 mg/dL (ref 0.40–1.50)
GFR: 6.56 mL/min — AB (ref >60.00)
GLUCOSE: 87 mg/dL (ref 70–99)
POTASSIUM: 4.1 meq/L (ref 3.5–5.1)
SODIUM: 136 meq/L (ref 135–145)

## 2016-11-04 LAB — PSA: PSA: 5.82 ng/mL — AB (ref 0.10–4.00)

## 2016-11-04 LAB — LDL CHOLESTEROL, DIRECT: Direct LDL: 140 mg/dL

## 2016-11-04 LAB — TSH: TSH: 0.58 u[IU]/mL (ref 0.35–4.50)

## 2016-11-04 MED ORDER — ATORVASTATIN CALCIUM 40 MG PO TABS: 40.0000 mg | ORAL_TABLET | Freq: Every day | ORAL | 3 refills | Status: DC

## 2016-11-04 NOTE — Progress Notes (Signed)
Subjective:    Patient ID: [name], male    DOB: [birth year], 56 y.o.   MRN: [id number]  HPI  Here for yearly f/u;  Overall doing ok;  Pt denies Chest pain, worsening SOB, DOE, wheezing, orthopnea, PND, worsening LE edema, palpitations, dizziness or syncope.  Pt denies neurological change such as new headache, facial or extremity weakness.  Pt denies polydipsia, polyuria, or low sugar symptoms. Pt states overall good compliance with treatment and medications, good tolerability, and has been trying to follow appropriate diet.  Pt denies worsening depressive symptoms, suicidal ideation or panic. No fever, night sweats, wt loss, loss of appetite, or other constitutional symptoms.  Pt states good ability with ADL's, has low fall risk, home safety reviewed and adequate, no other significant changes in hearing or vision, and only occasionally active with exercise.  Did have rare trip and fall in the house last wk x 1 without injury.  Past Medical History:  Diagnosis Date  . ACHALASIA   . ANEMIA-NOS   . CAD (coronary artery disease), 3 vessel significant disease.    . CEREBROVASCULAR ACCIDENT, HX OF   . CHF (congestive heart failure) (HCC)   . CKD (chronic kidney disease) stage 4, GFR 15-29 ml/min (HCC)   . Constipation   . DEGENERATIVE JOINT DISEASE, SHOULDER    knee  . DEPRESSION    2006- not depressed any longer  . Family history of adverse reaction to anesthesia    Mother, hard to awaken  . GERD    no meds required  . Gout    takes Uloric daily  . H/O hiatal hernia   . Headache(784.0)   . Heart attack (HCC)   . History of colon polyps   . History of diastolic dysfunction, grade 2 by echo   . HYPERLIPIDEMIA    takes crestor daily  . Hyperlipidemia   . HYPERTENSION    takes Norvasc,Catapress,Hydralazine,Imdur,and Metoprolol daily  . Impaired glucose tolerance   . Insomnia    takes Ambien nightly  . NSTEMI (non-ST elevated myocardial infarction), 06/30/11 07/01/2011  . RENAL  CALCULUS, HX OF   . RENAL INSUFFICIENCY    12/2013- TThSat  . S/P coronary artery stent placement, PTCA/DES Resolute to mid-LAD via the LIMA graft, and PTCA of apical 95% stenosis 07/05/11 07/04/2011  . S/P PTCA (percutaneous transluminal coronary angioplasty), 06/30/11 07/01/2011  . Seizures (HCC)    INSULIN INDUCED  . Sleep apnea    SLEEP STUDY IN [place] , NO MACHINE YET  1 YEAR  . Stroke (HCC)    93/2005/2006/2007;left sided weakness.  1993 12/2013   Past Surgical History:  Procedure Laterality Date  . ARCH AORTOGRAM N/A 02/19/2012   Procedure: ARCH AORTOGRAM;  Surgeon: Fransisco Hertz, MD;  Location: [facility] CATH LAB;  Service: Cardiovascular;  Laterality: N/A;  . AV FISTULA PLACEMENT  12/02/2011   Procedure: ARTERIOVENOUS (AV) FISTULA CREATION;  Surgeon: Fransisco Hertz, MD;  Location: [facility] OR;  Service: Vascular;  Laterality: Left;  Ultrasound Guided  . AV FISTULA PLACEMENT Left 06/08/2012   Procedure: ARTERIOVENOUS (AV) FISTULA CREATION;  Surgeon: Fransisco Hertz, MD;  Location: [facility] OR;  Service: Vascular;  Laterality: Left;  . BASCILIC VEIN TRANSPOSITION Left 09/06/2014   Procedure: FIRST STAGE BASILIC VEIN TRANSPOSITION ;  Surgeon: Fransisco Hertz, MD;  Location: [facility] OR;  Service: Vascular;  Laterality: Left;  . BASCILIC VEIN TRANSPOSITION Left 12/06/2014   Procedure: SECOND STAGE BRACHIAL VEIN TRANSPOSITION ;  Surgeon: Fransisco Hertz, MD;  Location: [facility] OR;  Service: Vascular;  Laterality: Left;  . CARDIAC CATHETERIZATION    . COLONOSCOPY    . CORONARY ANGIOPLASTY     06/30/11   AT [facility]  . CORONARY ARTERY BYPASS GRAFT  04/01/2011   Procedure: CORONARY ARTERY BYPASS GRAFTING (CABG);  Surgeon: Kathlee Nations Suann Larry, MD;  Location: [facility] OR;  Service: Open Heart Surgery;  Laterality: N/A;  . HERNIA REPAIR  2013  . INSERTION OF DIALYSIS CATHETER  04/01/2011   Procedure: INSERTION OF DIALYSIS CATHETER;  Surgeon: Nilda Simmer, MD;  Location: [facility] OR;  Service: Vascular;  Laterality: N/A;  . KNEE SURGERY Right  01/07/2011   arthroscopy  . LEFT HEART CATHETERIZATION WITH CORONARY ANGIOGRAM N/A 03/28/2011   Procedure: LEFT HEART CATHETERIZATION WITH CORONARY ANGIOGRAM;  Surgeon: Lennette Bihari, MD;  Location: [facility] CATH LAB;  Service: Cardiovascular;  Laterality: N/A;  pending creatnine  . LEFT HEART CATHETERIZATION WITH CORONARY/GRAFT ANGIOGRAM N/A 06/30/2011   Procedure: LEFT HEART CATHETERIZATION WITH Isabel Caprice;  Surgeon: Thurmon Fair, MD;  Location: [facility] CATH LAB;  Service: Cardiovascular;  Laterality: N/A;  . LIGATION OF ARTERIOVENOUS  FISTULA Left 06/08/2012   Procedure: LIGATION OF ARTERIOVENOUS  FISTULA;  Surgeon: Fransisco Hertz, MD;  Location: [facility] OR;  Service: Vascular;  Laterality: Left;  . NM MYOCAR PERF EJECTION FRACTION  06/25/2011   There is evidence of mild ischemia in the basal inferolateral and mid inferolateral regions. (Extent 7 %) The post-stress ejection fraction is 44%. There is mild hypocontractility in the distal inferoapical segment. baseline T wave inversion is noted in leads I, avL, II, V2. This is a low risk scan. The present perfusion study suggests significant myocardial salvage with mild residual ischemia .  Marland Kitchen PERCUTANEOUS CORONARY INTERVENTION-BALLOON ONLY  06/30/2011   Procedure: PERCUTANEOUS CORONARY INTERVENTION-BALLOON ONLY;  Surgeon: Thurmon Fair, MD;  Location: [facility] CATH LAB;  Service: Cardiovascular;;  . PERCUTANEOUS CORONARY STENT INTERVENTION (PCI-S) N/A 07/04/2011   Procedure: PERCUTANEOUS CORONARY STENT INTERVENTION (PCI-S);  Surgeon: Lennette Bihari, MD;  Location: [facility] CATH LAB;  Service: Cardiovascular;  Laterality: N/A;  . REVISION OF ARTERIOVENOUS GORETEX GRAFT Left 12/06/2014   Procedure: REVISION OF ARTERIOVENOUS GORETEX GRAFT USING 52mmx10cm;  Surgeon: Fransisco Hertz, MD;  Location: [facility] OR;  Service: Vascular;  Laterality: Left;  . STOMACH SURGERY  2009   achalasia  . SUBCLAVIAN STENT PLACEMENT  12/20/2011  . TONSILLECTOMY    . TONSILLECTOMY      reports  that he has never smoked. He quit smokeless tobacco use about 13 years ago. His smokeless tobacco use included Chew. He reports that he drinks about 1.2 oz of alcohol per week . He reports that he does not use drugs. family history includes Alzheimer's disease in his mother; Cancer in his father and mother; Heart disease in his father and mother; Hyperlipidemia in his mother and other; Hypertension in his mother and other; Stroke in his other. Allergies  Allergen Reactions  . Shrimp [Shellfish Allergy] Shortness Of Breath  . Atorvastatin Other (See Comments)    weakness  . Insulins Other (See Comments)    Patient unsure as to the kind of insulin but states that it has previously caused seizures.  Most recent hospitalization (Jan 2013) received insulin but did not have reaction. LIKELY DUE TO SIGNIFICANT HYPOGLYCEMIA  . Simvastatin Other (See Comments)     abnormal liver tests  . Ultram [Tramadol] Other (See Comments)    Liver enzyme    Current Outpatient Prescriptions on File  Prior to Visit  Medication Sig Dispense Refill  . amLODipine (NORVASC) 5 MG tablet Take 1 tablet (5 mg total) by mouth daily. 30 tablet 1  . aspirin EC 81 MG EC tablet Take 1 tablet (81 mg total) by mouth daily.    . cinacalcet (SENSIPAR) 90 MG tablet Take 90 mg by mouth at bedtime.    . cloNIDine (CATAPRES) 0.2 MG tablet Take 1 tablet (0.2 mg total) by mouth 2 (two) times daily. 30 tablet 1  . clopidogrel (PLAVIX) 75 MG tablet Take 1 tablet (75 mg total) by mouth daily. 90 tablet 4  . febuxostat (ULORIC) 40 MG tablet Take 40 mg by mouth daily as needed (gout).     Marland Kitchen lanthanum ([name]) 1000 MG chewable tablet Chew 2 tablets (2,000 mg total) by mouth 3 (three) times daily with meals. Yearly physical due in July must see MD for refills 540 tablet 0  . lidocaine-prilocaine (EMLA) cream Apply 1 application topically as needed. Apply as needed for dialysis site    . memantine (NAMENDA) 10 MG tablet Take 1 tablet (10 mg  total) by mouth 2 (two) times daily. 60 tablet 11  . metoprolol (LOPRESSOR) 50 MG tablet Take 1 tablet (50 mg total) by mouth 2 (two) times daily. 180 tablet 3  . NITROSTAT 0.4 MG SL tablet PLACE 1 TABLET UNDER TONGUE AS NEEDED FOR CHEST PAIN EVERY 5 MINUTES (MAX 3 DOSES) 25 tablet 4  . terazosin (HYTRIN) 10 MG capsule TAKE 1 CAPSULE BY MOUTH DAILY AT BEDTIME (Patient taking differently: Take 10 mg by mouth at bedtime. TAKE 1 CAPSULE BY MOUTH DAILY AT BEDTIME) 90 capsule 3  . zolpidem (AMBIEN) 10 MG tablet 1 tab by mouth at bedtime as needed for sleep 90 tablet 1   No current facility-administered medications on file prior to visit.    Review of Systems Constitutional: Negative for other unusual diaphoresis, sweats, appetite or weight changes HENT: Negative for other worsening hearing loss, ear pain, facial swelling, mouth sores or neck stiffness.   Eyes: Negative for other worsening pain, redness or other visual disturbance.  Respiratory: Negative for other stridor or swelling Cardiovascular: Negative for other palpitations or other chest pain  Gastrointestinal: Negative for worsening diarrhea or loose stools, blood in stool, distention or other pain Genitourinary: Negative for hematuria, flank pain or other change in urine volume.  Musculoskeletal: Negative for myalgias or other joint swelling.  Skin: Negative for other color change, or other wound or worsening drainage.  Neurological: Negative for other syncope or numbness. Hematological: Negative for other adenopathy or swelling Psychiatric/Behavioral: Negative for hallucinations, other worsening agitation, SI, self-injury, or new decreased concentration All other system neg per pt    Objective:   Physical Exam BP 126/78   Pulse 67   Temp 98.3 F (36.8 C)   Ht 6' (1.829 m)   Wt 225 lb (102.1 kg)   SpO2 97%   BMI 30.52 kg/m  VS noted,  Constitutional: Pt is oriented to person, place, and time. Appears well-developed and  well-nourished, in no significant distress and comfortable Head: Normocephalic and atraumatic  Eyes: Conjunctivae and EOM are normal. Pupils are equal, round, and reactive to light Right Ear: External ear normal without discharge Left Ear: External ear normal without discharge Nose: Nose without discharge or deformity Mouth/Throat: Oropharynx is without other ulcerations and moist  Neck: Normal range of motion. Neck supple. No JVD present. No tracheal deviation present or significant neck LA or mass Cardiovascular: Normal rate, regular  rhythm, normal heart sounds and intact distal pulses.   Pulmonary/Chest: WOB normal and breath sounds without rales or wheezing  Abdominal: Soft. Bowel sounds are normal. NT. No HSM  Musculoskeletal: Normal range of motion. Exhibits no edema Lymphadenopathy: Has no other cervical adenopathy.  Neurological: Pt is alert and oriented to person, place, and time. Pt has normal reflexes. No cranial nerve deficit. Motor grossly intact, Gait intact Skin: Skin is warm and dry. No rash noted or new ulcerations Psychiatric:  Has normal mood and affect. Behavior is normal without agitation No other exam findings    Assessment & Plan:

## 2016-11-04 NOTE — Patient Instructions (Addendum)
Please continue all other medications as before, and refills have been done if requested.  Please have the pharmacy call with any other refills you may need.  Please continue your efforts at being more active, low cholesterol diet, and weight control.  You are otherwise up to date with prevention measures today.  Please keep your appointments with your specialists as you may have planned  You will be contacted regarding the referral for: colonoscopy  Please go to the LAB in the Basement (turn left off the elevator) for the tests to be done today  You will be contacted by phone if any changes need to be made immediately.  Otherwise, you will receive a letter about your results with an explanation, but please check with MyChart first.  Please remember to sign up for MyChart if you have not done so, as this will be important to you in the future with finding out test results, communicating by private email, and scheduling acute appointments online when needed.  Please return in 6 months, or sooner if needed 

## 2016-11-04 NOTE — Assessment & Plan Note (Signed)
History of hyperlipidemia currently not on statin therapy with recent lipid profile performed 05/06/16 revealing a total cholesterol of 282, troponin 1 level CCXLIII and LDL that could not be done. I'm going to begin him on atorvastatin 40 mg a day and will recheck a lipid liver profile in 2 months

## 2016-11-04 NOTE — Progress Notes (Signed)
11/04/2016 [name]   Feb 07, 1961  [id number]  Primary Physician Corwin Levins, MD Primary Cardiologist: Runell Gess MD [name], [place]  HPI:  [name] is a 56 y.o. male  mild to moderately overweight, divorced, African American male, father of one 26 year old son . I saw him 07/27/15. He is accompanied by his sister [name] today. He has a history of CAD status post bypass grafting by Dr. Kathlee Nations Trigt April 01, 2011 with a LIMA to his LAD, a vein to an obtuse marginal branch 1 and 2, and to the posterolateral branch. His EF was apparently normal pre and postoperatively. He also has difficult to control hypertension, chronic renal insufficiency, now stage 5, for which he sees Dr. Elvis Coil. He is scheduled to have an AV fistula placed by Dr. Imogene Burn sometime in the next several weeks in anticipation of dialysis at some point in the future.   He was admitted in April with substernal chest pain and anterolateral T-wave inversion and was cathed and intervened on by Dr. Tresa Endo. He had PCI of his OM vein graft insertion which was too small to stent, and stenting of the LAD beyond his LIMA insertion.  He started hemodialysis in October 2015 and apparently had a stroke at the same time as well. He denies chest pain or shortness of breath.    Current Meds  Medication Sig  . amLODipine (NORVASC) 5 MG tablet Take 1 tablet (5 mg total) by mouth daily.  Marland Kitchen aspirin EC 81 MG EC tablet Take 1 tablet (81 mg total) by mouth daily.  . cinacalcet (SENSIPAR) 90 MG tablet Take 90 mg by mouth at bedtime.  . cloNIDine (CATAPRES) 0.2 MG tablet Take 1 tablet (0.2 mg total) by mouth 2 (two) times daily.  . clopidogrel (PLAVIX) 75 MG tablet Take 1 tablet (75 mg total) by mouth daily.  . febuxostat (ULORIC) 40 MG tablet Take 40 mg by mouth daily as needed (gout).   Marland Kitchen lanthanum ([name]) 1000 MG chewable tablet Chew 2 tablets (2,000 mg total) by mouth 3 (three) times daily with meals.  Yearly physical due in July must see MD for refills  . lidocaine-prilocaine (EMLA) cream Apply 1 application topically as needed. Apply as needed for dialysis site  . memantine (NAMENDA) 10 MG tablet Take 1 tablet (10 mg total) by mouth 2 (two) times daily.  . metoprolol (LOPRESSOR) 50 MG tablet Take 1 tablet (50 mg total) by mouth 2 (two) times daily.  Marland Kitchen NITROSTAT 0.4 MG SL tablet PLACE 1 TABLET UNDER TONGUE AS NEEDED FOR CHEST PAIN EVERY 5 MINUTES (MAX 3 DOSES)  . terazosin (HYTRIN) 10 MG capsule TAKE 1 CAPSULE BY MOUTH DAILY AT BEDTIME (Patient taking differently: Take 10 mg by mouth at bedtime. TAKE 1 CAPSULE BY MOUTH DAILY AT BEDTIME)  . zolpidem (AMBIEN) 10 MG tablet 1 tab by mouth at bedtime as needed for sleep  . [DISCONTINUED] atorvastatin (LIPITOR) 20 MG tablet Take 1 tablet (20 mg total) by mouth daily.     Allergies  Allergen Reactions  . Shrimp [Shellfish Allergy] Shortness Of Breath  . Atorvastatin Other (See Comments)    weakness  . Insulins Other (See Comments)    Patient unsure as to the kind of insulin but states that it has previously caused seizures.  Most recent hospitalization (Jan 2013) received insulin but did not have reaction. LIKELY DUE TO SIGNIFICANT HYPOGLYCEMIA  . Simvastatin Other (See Comments)  abnormal liver tests  . Ultram [Tramadol] Other (See Comments)    Liver enzyme     Social History   Social History  . Marital status: Divorced    Spouse name: N/A  . Number of children: 1  . Years of education: Bachelors   Occupational History  . Disabled    Social History Main Topics  . Smoking status: Never Smoker  . Smokeless tobacco: Former Neurosurgeon    Types: Chew    Quit date: 03/18/2003     Comment: chewed tobacco   . Alcohol use 1.2 oz/week    2 Shots of liquor per week     Comment: rarely; beer  . Drug use: No  . Sexual activity: Not Currently    Partners: Male   Other Topics Concern  . Not on file   Social History Narrative   Lives at  home alone.   Right-handed.   Occasional caffeine use.     Review of Systems: General: negative for chills, fever, night sweats or weight changes.  Cardiovascular: negative for chest pain, dyspnea on exertion, edema, orthopnea, palpitations, paroxysmal nocturnal dyspnea or shortness of breath Dermatological: negative for rash Respiratory: negative for cough or wheezing Urologic: negative for hematuria Abdominal: negative for nausea, vomiting, diarrhea, bright red blood per rectum, melena, or hematemesis Neurologic: negative for visual changes, syncope, or dizziness All other systems reviewed and are otherwise negative except as noted above.    Blood pressure 109/71, pulse 78, height 6' (1.829 m), weight 226 lb (102.5 kg), SpO2 98 %.  General appearance: alert and no distress Neck: no adenopathy, no carotid bruit, no JVD, supple, symmetrical, trachea midline and thyroid not enlarged, symmetric, no tenderness/mass/nodules Lungs: clear to auscultation bilaterally Heart: regular rate and rhythm, S1, S2 normal, no murmur, click, rub or gallop Extremities: extremities normal, atraumatic, no cyanosis or edema  EKG not performed today  ASSESSMENT AND PLAN:   Hyperlipidemia History of hyperlipidemia currently not on statin therapy with recent lipid profile performed 05/06/16 revealing a total cholesterol of 282, troponin 1 level CCXLIII and LDL that could not be done. I'm going to begin him on atorvastatin 40 mg a day and will recheck a lipid liver profile in 2 months  Essential hypertension History of essential hypertension blood pressure measured 109/71. He is on amlodipine and metoprolol. Continue current meds at current dosing  CAD, 3 vessel significant disease at cath this admission. underwent CABG in Jan 2013 History of CAD status post bypass grafting by Dr. Kathlee Nations Trigt 04/01/11 with a LIMA to the LAD, vein to obtuse marginal branch 1 and 2 sequentially and to the posterior lateral  branch. EF was normal at that time. He did have an episode of unstable angina and underwent cath by Dr. Tresa Endo April 2016. He had PCI of the insertion of his OM vein graft which was too small to stent as well as stenting of his LAD beyond LIMA insertion. He denies chest pain or shortness of breath.      Runell Gess MD FACP,FACC,FAHA, [facility] 11/04/2016 4:17 PM

## 2016-11-04 NOTE — Assessment & Plan Note (Signed)
History of CAD status post bypass grafting by Dr. Kathlee Nations Trigt 04/01/11 with a LIMA to the LAD, vein to obtuse marginal branch 1 and 2 sequentially and to the posterior lateral branch. EF was normal at that time. He did have an episode of unstable angina and underwent cath by Dr. Tresa Endo April 2016. He had PCI of the insertion of his OM vein graft which was too small to stent as well as stenting of his LAD beyond LIMA insertion. He denies chest pain or shortness of breath.

## 2016-11-04 NOTE — Assessment & Plan Note (Signed)
History of essential hypertension blood pressure measured 109/71. He is on amlodipine and metoprolol. Continue current meds at current dosing

## 2016-11-04 NOTE — Patient Instructions (Signed)
Medication Instructions: START Atorvastatin 40 mg daily.   Labwork: Your physician recommends that you return for a FASTING lipid profile and hepatic function panel in 2 months.   Follow-Up: Your physician wants you to follow-up in: 1 year with Dr. Allyson Sabal. You will receive a reminder letter in the mail two months in advance. If you don't receive a letter, please call our office to schedule the follow-up appointment.  If you need a refill on your cardiac medications before your next appointment, please call your pharmacy.

## 2016-11-05 ENCOUNTER — Encounter: Payer: Self-pay | Admitting: Internal Medicine

## 2016-11-05 DIAGNOSIS — N2581 Secondary hyperparathyroidism of renal origin: Secondary | ICD-10-CM | POA: Diagnosis not present

## 2016-11-05 DIAGNOSIS — N186 End stage renal disease: Secondary | ICD-10-CM | POA: Diagnosis not present

## 2016-11-05 DIAGNOSIS — D631 Anemia in chronic kidney disease: Secondary | ICD-10-CM | POA: Diagnosis not present

## 2016-11-05 NOTE — Assessment & Plan Note (Signed)
stable overall by history and exam, recent data reviewed with pt, and pt to continue medical treatment as before,  to f/u any worsening symptoms or concerns Lab Results  Component Value Date   LDLCALC 89 12/30/2013    

## 2016-11-05 NOTE — Assessment & Plan Note (Signed)
Asympt, for f/u psa today,  to f/u any worsening symptoms or concerns

## 2016-11-05 NOTE — Assessment & Plan Note (Signed)
stable overall by history and exam, recent data reviewed with pt, and pt to continue medical treatment as before,  to f/u any worsening symptoms or concerns BP Readings from Last 3 Encounters:  11/04/16 109/71  11/04/16 126/78  10/14/16 102/66

## 2016-11-05 NOTE — Assessment & Plan Note (Signed)
stable overall by history and exam, recent data reviewed with pt, and pt to continue medical treatment as before,  to f/u any worsening symptoms or concerns Lab Results  Component Value Date   HGBA1C 5.2 05/06/2016   

## 2016-11-07 DIAGNOSIS — D631 Anemia in chronic kidney disease: Secondary | ICD-10-CM | POA: Diagnosis not present

## 2016-11-07 DIAGNOSIS — N2581 Secondary hyperparathyroidism of renal origin: Secondary | ICD-10-CM | POA: Diagnosis not present

## 2016-11-07 DIAGNOSIS — N186 End stage renal disease: Secondary | ICD-10-CM | POA: Diagnosis not present

## 2016-11-10 ENCOUNTER — Other Ambulatory Visit: Payer: Self-pay | Admitting: Internal Medicine

## 2016-11-10 DIAGNOSIS — D631 Anemia in chronic kidney disease: Secondary | ICD-10-CM | POA: Diagnosis not present

## 2016-11-10 DIAGNOSIS — N2581 Secondary hyperparathyroidism of renal origin: Secondary | ICD-10-CM | POA: Diagnosis not present

## 2016-11-10 DIAGNOSIS — N186 End stage renal disease: Secondary | ICD-10-CM | POA: Diagnosis not present

## 2016-11-12 ENCOUNTER — Other Ambulatory Visit: Payer: Self-pay | Admitting: Internal Medicine

## 2016-11-12 DIAGNOSIS — D631 Anemia in chronic kidney disease: Secondary | ICD-10-CM | POA: Diagnosis not present

## 2016-11-12 DIAGNOSIS — Z8679 Personal history of other diseases of the circulatory system: Secondary | ICD-10-CM

## 2016-11-12 DIAGNOSIS — N186 End stage renal disease: Secondary | ICD-10-CM | POA: Diagnosis not present

## 2016-11-12 DIAGNOSIS — N2581 Secondary hyperparathyroidism of renal origin: Secondary | ICD-10-CM | POA: Diagnosis not present

## 2016-11-12 NOTE — Telephone Encounter (Signed)
Done hardcopy to [place]  

## 2016-11-12 NOTE — Telephone Encounter (Signed)
faxed

## 2016-11-14 DIAGNOSIS — Z992 Dependence on renal dialysis: Secondary | ICD-10-CM | POA: Diagnosis not present

## 2016-11-14 DIAGNOSIS — N186 End stage renal disease: Secondary | ICD-10-CM | POA: Diagnosis not present

## 2016-11-14 DIAGNOSIS — I120 Hypertensive chronic kidney disease with stage 5 chronic kidney disease or end stage renal disease: Secondary | ICD-10-CM | POA: Diagnosis not present

## 2016-11-14 DIAGNOSIS — N2581 Secondary hyperparathyroidism of renal origin: Secondary | ICD-10-CM | POA: Diagnosis not present

## 2016-11-14 DIAGNOSIS — D631 Anemia in chronic kidney disease: Secondary | ICD-10-CM | POA: Diagnosis not present

## 2016-11-17 DIAGNOSIS — N186 End stage renal disease: Secondary | ICD-10-CM | POA: Diagnosis not present

## 2016-11-17 DIAGNOSIS — Z23 Encounter for immunization: Secondary | ICD-10-CM | POA: Diagnosis not present

## 2016-11-17 DIAGNOSIS — N2581 Secondary hyperparathyroidism of renal origin: Secondary | ICD-10-CM | POA: Diagnosis not present

## 2016-11-19 DIAGNOSIS — N186 End stage renal disease: Secondary | ICD-10-CM | POA: Diagnosis not present

## 2016-11-19 DIAGNOSIS — N2581 Secondary hyperparathyroidism of renal origin: Secondary | ICD-10-CM | POA: Diagnosis not present

## 2016-11-19 DIAGNOSIS — Z23 Encounter for immunization: Secondary | ICD-10-CM | POA: Diagnosis not present

## 2016-11-21 DIAGNOSIS — Z23 Encounter for immunization: Secondary | ICD-10-CM | POA: Diagnosis not present

## 2016-11-21 DIAGNOSIS — N186 End stage renal disease: Secondary | ICD-10-CM | POA: Diagnosis not present

## 2016-11-21 DIAGNOSIS — N2581 Secondary hyperparathyroidism of renal origin: Secondary | ICD-10-CM | POA: Diagnosis not present

## 2016-11-24 DIAGNOSIS — N186 End stage renal disease: Secondary | ICD-10-CM | POA: Diagnosis not present

## 2016-11-24 DIAGNOSIS — Z23 Encounter for immunization: Secondary | ICD-10-CM | POA: Diagnosis not present

## 2016-11-24 DIAGNOSIS — N2581 Secondary hyperparathyroidism of renal origin: Secondary | ICD-10-CM | POA: Diagnosis not present

## 2016-11-26 DIAGNOSIS — Z23 Encounter for immunization: Secondary | ICD-10-CM | POA: Diagnosis not present

## 2016-11-26 DIAGNOSIS — N186 End stage renal disease: Secondary | ICD-10-CM | POA: Diagnosis not present

## 2016-11-26 DIAGNOSIS — N2581 Secondary hyperparathyroidism of renal origin: Secondary | ICD-10-CM | POA: Diagnosis not present

## 2016-11-28 DIAGNOSIS — N186 End stage renal disease: Secondary | ICD-10-CM | POA: Diagnosis not present

## 2016-11-28 DIAGNOSIS — Z23 Encounter for immunization: Secondary | ICD-10-CM | POA: Diagnosis not present

## 2016-11-28 DIAGNOSIS — N2581 Secondary hyperparathyroidism of renal origin: Secondary | ICD-10-CM | POA: Diagnosis not present

## 2016-12-02 DIAGNOSIS — N186 End stage renal disease: Secondary | ICD-10-CM | POA: Diagnosis not present

## 2016-12-02 DIAGNOSIS — N2581 Secondary hyperparathyroidism of renal origin: Secondary | ICD-10-CM | POA: Diagnosis not present

## 2016-12-02 DIAGNOSIS — Z23 Encounter for immunization: Secondary | ICD-10-CM | POA: Diagnosis not present

## 2016-12-03 DIAGNOSIS — Z23 Encounter for immunization: Secondary | ICD-10-CM | POA: Diagnosis not present

## 2016-12-03 DIAGNOSIS — N2581 Secondary hyperparathyroidism of renal origin: Secondary | ICD-10-CM | POA: Diagnosis not present

## 2016-12-03 DIAGNOSIS — N186 End stage renal disease: Secondary | ICD-10-CM | POA: Diagnosis not present

## 2016-12-05 DIAGNOSIS — N186 End stage renal disease: Secondary | ICD-10-CM | POA: Diagnosis not present

## 2016-12-05 DIAGNOSIS — Z23 Encounter for immunization: Secondary | ICD-10-CM | POA: Diagnosis not present

## 2016-12-05 DIAGNOSIS — N2581 Secondary hyperparathyroidism of renal origin: Secondary | ICD-10-CM | POA: Diagnosis not present

## 2016-12-06 ENCOUNTER — Other Ambulatory Visit: Payer: Self-pay | Admitting: Cardiovascular Disease

## 2016-12-08 DIAGNOSIS — N2581 Secondary hyperparathyroidism of renal origin: Secondary | ICD-10-CM | POA: Diagnosis not present

## 2016-12-08 DIAGNOSIS — N186 End stage renal disease: Secondary | ICD-10-CM | POA: Diagnosis not present

## 2016-12-08 DIAGNOSIS — Z23 Encounter for immunization: Secondary | ICD-10-CM | POA: Diagnosis not present

## 2016-12-10 DIAGNOSIS — Z23 Encounter for immunization: Secondary | ICD-10-CM | POA: Diagnosis not present

## 2016-12-10 DIAGNOSIS — N186 End stage renal disease: Secondary | ICD-10-CM | POA: Diagnosis not present

## 2016-12-10 DIAGNOSIS — N2581 Secondary hyperparathyroidism of renal origin: Secondary | ICD-10-CM | POA: Diagnosis not present

## 2016-12-12 DIAGNOSIS — N2581 Secondary hyperparathyroidism of renal origin: Secondary | ICD-10-CM | POA: Diagnosis not present

## 2016-12-12 DIAGNOSIS — Z23 Encounter for immunization: Secondary | ICD-10-CM | POA: Diagnosis not present

## 2016-12-12 DIAGNOSIS — N186 End stage renal disease: Secondary | ICD-10-CM | POA: Diagnosis not present

## 2016-12-14 DIAGNOSIS — N186 End stage renal disease: Secondary | ICD-10-CM | POA: Diagnosis not present

## 2016-12-14 DIAGNOSIS — I120 Hypertensive chronic kidney disease with stage 5 chronic kidney disease or end stage renal disease: Secondary | ICD-10-CM | POA: Diagnosis not present

## 2016-12-14 DIAGNOSIS — Z992 Dependence on renal dialysis: Secondary | ICD-10-CM | POA: Diagnosis not present

## 2016-12-15 DIAGNOSIS — D509 Iron deficiency anemia, unspecified: Secondary | ICD-10-CM | POA: Diagnosis not present

## 2016-12-15 DIAGNOSIS — N186 End stage renal disease: Secondary | ICD-10-CM | POA: Diagnosis not present

## 2016-12-15 DIAGNOSIS — N2581 Secondary hyperparathyroidism of renal origin: Secondary | ICD-10-CM | POA: Diagnosis not present

## 2016-12-17 DIAGNOSIS — D509 Iron deficiency anemia, unspecified: Secondary | ICD-10-CM | POA: Diagnosis not present

## 2016-12-17 DIAGNOSIS — N186 End stage renal disease: Secondary | ICD-10-CM | POA: Diagnosis not present

## 2016-12-17 DIAGNOSIS — N2581 Secondary hyperparathyroidism of renal origin: Secondary | ICD-10-CM | POA: Diagnosis not present

## 2016-12-18 ENCOUNTER — Ambulatory Visit (INDEPENDENT_AMBULATORY_CARE_PROVIDER_SITE_OTHER): Payer: Medicare Other | Admitting: Podiatry

## 2016-12-18 ENCOUNTER — Encounter: Payer: Self-pay | Admitting: Podiatry

## 2016-12-18 DIAGNOSIS — B351 Tinea unguium: Secondary | ICD-10-CM

## 2016-12-18 DIAGNOSIS — M79676 Pain in unspecified toe(s): Secondary | ICD-10-CM

## 2016-12-18 DIAGNOSIS — B353 Tinea pedis: Secondary | ICD-10-CM

## 2016-12-18 MED ORDER — KETOCONAZOLE 2 % EX CREA: 1.0000 "application " | TOPICAL_CREAM | Freq: Every day | CUTANEOUS | 2 refills | Status: DC

## 2016-12-18 MED ORDER — UREA 40 % EX OINT: TOPICAL_OINTMENT | CUTANEOUS | 0 refills | Status: DC | PRN

## 2016-12-19 ENCOUNTER — Telehealth: Payer: Self-pay | Admitting: Podiatry

## 2016-12-19 DIAGNOSIS — D509 Iron deficiency anemia, unspecified: Secondary | ICD-10-CM | POA: Diagnosis not present

## 2016-12-19 DIAGNOSIS — N186 End stage renal disease: Secondary | ICD-10-CM | POA: Diagnosis not present

## 2016-12-19 DIAGNOSIS — N2581 Secondary hyperparathyroidism of renal origin: Secondary | ICD-10-CM | POA: Diagnosis not present

## 2016-12-19 NOTE — Telephone Encounter (Signed)
Left message informing pt the [facility] stated Gordons Urea Cream was out of production, and our office carried a 40% urea product [id number] that was applied daily to toenails and helped soften for filing thinner and making nails easier to cut which often helped with appearance. I told pt to call if he would like me to set aside a jar, it cost $22.00.

## 2016-12-19 NOTE — Telephone Encounter (Signed)
This is [facility] calling about Rx received for gordons urea ointment 40%. It looks like that is off the market, was trying to see if there was something else you could call in. Please call us back at [phone]. Thank you.

## 2016-12-22 ENCOUNTER — Telehealth: Payer: Self-pay | Admitting: Podiatry

## 2016-12-22 DIAGNOSIS — N2581 Secondary hyperparathyroidism of renal origin: Secondary | ICD-10-CM | POA: Diagnosis not present

## 2016-12-22 DIAGNOSIS — N186 End stage renal disease: Secondary | ICD-10-CM | POA: Diagnosis not present

## 2016-12-22 DIAGNOSIS — D509 Iron deficiency anemia, unspecified: Secondary | ICD-10-CM | POA: Diagnosis not present

## 2016-12-22 NOTE — Telephone Encounter (Signed)
Left message [facility] okaying switch to Urea 40% Cream.

## 2016-12-22 NOTE — Telephone Encounter (Signed)
This is [place] calling from [facility] about a Rx sent by Dr. Ardelle Anton on 04 October. It was for urea 40% ointment, that is currently off the market. I was calling to see if we could switch it to the cream. If you would, please call me back at [phone]. Thank you.

## 2016-12-23 NOTE — Progress Notes (Signed)
Patient ID: [name], male   DOB: 1961-01-13, 56 y.o.   MRN: [id number]  Subjective: 56 y.o. returnss the office today for painful, elongated, thickened toenails which he is unable to trim himself. Denies any redness or drainage around the nails.He is concerned about the thickening to his nails. He also has athlete's foot he states. Denies any acute changes since last appointment and no new complaints today. Denies any systemic complaints such as fevers, chills, nausea, vomiting.   Objective: AAO 3, NAD DP/PT pulses palpable, CRT less than 3 seconds Protective sensation intact with Simms Weinstein monofilament Nails hypertrophic, dystrophic, elongated, brittle, discolored 10. There is tenderness overlying the nails 1-5 bilaterally. There is no surrounding erythema or drainage along the nail sites. Evidence of tinea pedis interdigitally to the plantar aspect of the feet. There is no open sores. No open lesions or pre-ulcerative lesions are identified. No other areas of tenderness bilateral lower extremities. No overlying edema, erythema, increased warmth. No pain with calf compression, swelling, warmth, erythema.  Assessment: Patient presents with symptomatic onychomycosis; tinea pedis  Plan: -Treatment options including alternatives, risks, complications were discussed -Nails sharply debrided 10 without complication/bleeding. Prescribed urea cream to the thickening of the nails. Also discussed onychomycosis treatment -Prescribed ketoconazole for tinea pedis. -Discussed daily foot inspection. If there are any changes, to call the office immediately.  -Follow-up in 3 months or sooner if any problems are to arise. In the meantime, encouraged to call the office with any questions, concerns, changes symptoms.  Ovid Curd, DPM

## 2016-12-24 DIAGNOSIS — N2581 Secondary hyperparathyroidism of renal origin: Secondary | ICD-10-CM | POA: Diagnosis not present

## 2016-12-24 DIAGNOSIS — N186 End stage renal disease: Secondary | ICD-10-CM | POA: Diagnosis not present

## 2016-12-24 DIAGNOSIS — D509 Iron deficiency anemia, unspecified: Secondary | ICD-10-CM | POA: Diagnosis not present

## 2016-12-26 DIAGNOSIS — N2581 Secondary hyperparathyroidism of renal origin: Secondary | ICD-10-CM | POA: Diagnosis not present

## 2016-12-26 DIAGNOSIS — D509 Iron deficiency anemia, unspecified: Secondary | ICD-10-CM | POA: Diagnosis not present

## 2016-12-26 DIAGNOSIS — N186 End stage renal disease: Secondary | ICD-10-CM | POA: Diagnosis not present

## 2016-12-29 DIAGNOSIS — N2581 Secondary hyperparathyroidism of renal origin: Secondary | ICD-10-CM | POA: Diagnosis not present

## 2016-12-29 DIAGNOSIS — N186 End stage renal disease: Secondary | ICD-10-CM | POA: Diagnosis not present

## 2016-12-29 DIAGNOSIS — D509 Iron deficiency anemia, unspecified: Secondary | ICD-10-CM | POA: Diagnosis not present

## 2016-12-31 DIAGNOSIS — D509 Iron deficiency anemia, unspecified: Secondary | ICD-10-CM | POA: Diagnosis not present

## 2016-12-31 DIAGNOSIS — N186 End stage renal disease: Secondary | ICD-10-CM | POA: Diagnosis not present

## 2016-12-31 DIAGNOSIS — N2581 Secondary hyperparathyroidism of renal origin: Secondary | ICD-10-CM | POA: Diagnosis not present

## 2017-01-02 DIAGNOSIS — N186 End stage renal disease: Secondary | ICD-10-CM | POA: Diagnosis not present

## 2017-01-02 DIAGNOSIS — N2581 Secondary hyperparathyroidism of renal origin: Secondary | ICD-10-CM | POA: Diagnosis not present

## 2017-01-02 DIAGNOSIS — D509 Iron deficiency anemia, unspecified: Secondary | ICD-10-CM | POA: Diagnosis not present

## 2017-01-05 DIAGNOSIS — N2581 Secondary hyperparathyroidism of renal origin: Secondary | ICD-10-CM | POA: Diagnosis not present

## 2017-01-05 DIAGNOSIS — N186 End stage renal disease: Secondary | ICD-10-CM | POA: Diagnosis not present

## 2017-01-05 DIAGNOSIS — D509 Iron deficiency anemia, unspecified: Secondary | ICD-10-CM | POA: Diagnosis not present

## 2017-01-07 DIAGNOSIS — D509 Iron deficiency anemia, unspecified: Secondary | ICD-10-CM | POA: Diagnosis not present

## 2017-01-07 DIAGNOSIS — N186 End stage renal disease: Secondary | ICD-10-CM | POA: Diagnosis not present

## 2017-01-07 DIAGNOSIS — N2581 Secondary hyperparathyroidism of renal origin: Secondary | ICD-10-CM | POA: Diagnosis not present

## 2017-01-09 DIAGNOSIS — N2581 Secondary hyperparathyroidism of renal origin: Secondary | ICD-10-CM | POA: Diagnosis not present

## 2017-01-09 DIAGNOSIS — D509 Iron deficiency anemia, unspecified: Secondary | ICD-10-CM | POA: Diagnosis not present

## 2017-01-09 DIAGNOSIS — N186 End stage renal disease: Secondary | ICD-10-CM | POA: Diagnosis not present

## 2017-01-12 DIAGNOSIS — N186 End stage renal disease: Secondary | ICD-10-CM | POA: Diagnosis not present

## 2017-01-12 DIAGNOSIS — D509 Iron deficiency anemia, unspecified: Secondary | ICD-10-CM | POA: Diagnosis not present

## 2017-01-12 DIAGNOSIS — N2581 Secondary hyperparathyroidism of renal origin: Secondary | ICD-10-CM | POA: Diagnosis not present

## 2017-01-14 DIAGNOSIS — N186 End stage renal disease: Secondary | ICD-10-CM | POA: Diagnosis not present

## 2017-01-14 DIAGNOSIS — I120 Hypertensive chronic kidney disease with stage 5 chronic kidney disease or end stage renal disease: Secondary | ICD-10-CM | POA: Diagnosis not present

## 2017-01-14 DIAGNOSIS — N2581 Secondary hyperparathyroidism of renal origin: Secondary | ICD-10-CM | POA: Diagnosis not present

## 2017-01-14 DIAGNOSIS — D509 Iron deficiency anemia, unspecified: Secondary | ICD-10-CM | POA: Diagnosis not present

## 2017-01-14 DIAGNOSIS — Z992 Dependence on renal dialysis: Secondary | ICD-10-CM | POA: Diagnosis not present

## 2017-01-15 ENCOUNTER — Encounter: Payer: Medicare Other | Admitting: Internal Medicine

## 2017-01-16 DIAGNOSIS — N186 End stage renal disease: Secondary | ICD-10-CM | POA: Diagnosis not present

## 2017-01-16 DIAGNOSIS — Z992 Dependence on renal dialysis: Secondary | ICD-10-CM | POA: Diagnosis not present

## 2017-01-16 DIAGNOSIS — N2581 Secondary hyperparathyroidism of renal origin: Secondary | ICD-10-CM | POA: Diagnosis not present

## 2017-01-19 DIAGNOSIS — Z992 Dependence on renal dialysis: Secondary | ICD-10-CM | POA: Diagnosis not present

## 2017-01-19 DIAGNOSIS — N186 End stage renal disease: Secondary | ICD-10-CM | POA: Diagnosis not present

## 2017-01-19 DIAGNOSIS — N2581 Secondary hyperparathyroidism of renal origin: Secondary | ICD-10-CM | POA: Diagnosis not present

## 2017-01-21 ENCOUNTER — Telehealth: Payer: Self-pay | Admitting: Internal Medicine

## 2017-01-21 DIAGNOSIS — N2581 Secondary hyperparathyroidism of renal origin: Secondary | ICD-10-CM | POA: Diagnosis not present

## 2017-01-21 DIAGNOSIS — Z992 Dependence on renal dialysis: Secondary | ICD-10-CM | POA: Diagnosis not present

## 2017-01-21 DIAGNOSIS — N186 End stage renal disease: Secondary | ICD-10-CM | POA: Diagnosis not present

## 2017-01-21 NOTE — Telephone Encounter (Signed)
Copied from CRM #[id number]. Topic: Quick Communication - See Telephone Encounter >> Jan 21, 2017 11:46 AM Lynnell ChadFoskey, Sheneka L wrote: CRM for notification. See Telephone encounter for:   01/21/17.  Left message for patient to call back and schedule Medicare Annual Wellness Visit (AWV).  Last AWV 11/08/2015; please schedule on or after 11/07/2016. SF

## 2017-01-23 DIAGNOSIS — N2581 Secondary hyperparathyroidism of renal origin: Secondary | ICD-10-CM | POA: Diagnosis not present

## 2017-01-23 DIAGNOSIS — N186 End stage renal disease: Secondary | ICD-10-CM | POA: Diagnosis not present

## 2017-01-23 DIAGNOSIS — Z992 Dependence on renal dialysis: Secondary | ICD-10-CM | POA: Diagnosis not present

## 2017-01-26 DIAGNOSIS — N186 End stage renal disease: Secondary | ICD-10-CM | POA: Diagnosis not present

## 2017-01-26 DIAGNOSIS — N2581 Secondary hyperparathyroidism of renal origin: Secondary | ICD-10-CM | POA: Diagnosis not present

## 2017-01-26 DIAGNOSIS — Z992 Dependence on renal dialysis: Secondary | ICD-10-CM | POA: Diagnosis not present

## 2017-01-28 DIAGNOSIS — Z992 Dependence on renal dialysis: Secondary | ICD-10-CM | POA: Diagnosis not present

## 2017-01-28 DIAGNOSIS — N2581 Secondary hyperparathyroidism of renal origin: Secondary | ICD-10-CM | POA: Diagnosis not present

## 2017-01-28 DIAGNOSIS — N186 End stage renal disease: Secondary | ICD-10-CM | POA: Diagnosis not present

## 2017-01-30 DIAGNOSIS — N186 End stage renal disease: Secondary | ICD-10-CM | POA: Diagnosis not present

## 2017-01-30 DIAGNOSIS — N2581 Secondary hyperparathyroidism of renal origin: Secondary | ICD-10-CM | POA: Diagnosis not present

## 2017-01-30 DIAGNOSIS — Z992 Dependence on renal dialysis: Secondary | ICD-10-CM | POA: Diagnosis not present

## 2017-02-01 DIAGNOSIS — N186 End stage renal disease: Secondary | ICD-10-CM | POA: Diagnosis not present

## 2017-02-01 DIAGNOSIS — Z992 Dependence on renal dialysis: Secondary | ICD-10-CM | POA: Diagnosis not present

## 2017-02-01 DIAGNOSIS — N2581 Secondary hyperparathyroidism of renal origin: Secondary | ICD-10-CM | POA: Diagnosis not present

## 2017-02-03 DIAGNOSIS — N186 End stage renal disease: Secondary | ICD-10-CM | POA: Diagnosis not present

## 2017-02-03 DIAGNOSIS — Z992 Dependence on renal dialysis: Secondary | ICD-10-CM | POA: Diagnosis not present

## 2017-02-03 DIAGNOSIS — N2581 Secondary hyperparathyroidism of renal origin: Secondary | ICD-10-CM | POA: Diagnosis not present

## 2017-02-06 DIAGNOSIS — Z992 Dependence on renal dialysis: Secondary | ICD-10-CM | POA: Diagnosis not present

## 2017-02-06 DIAGNOSIS — N186 End stage renal disease: Secondary | ICD-10-CM | POA: Diagnosis not present

## 2017-02-06 DIAGNOSIS — N2581 Secondary hyperparathyroidism of renal origin: Secondary | ICD-10-CM | POA: Diagnosis not present

## 2017-02-12 DIAGNOSIS — I2510 Atherosclerotic heart disease of native coronary artery without angina pectoris: Secondary | ICD-10-CM | POA: Diagnosis present

## 2017-02-12 DIAGNOSIS — Z992 Dependence on renal dialysis: Secondary | ICD-10-CM | POA: Diagnosis not present

## 2017-02-12 DIAGNOSIS — N186 End stage renal disease: Secondary | ICD-10-CM | POA: Diagnosis not present

## 2017-02-12 DIAGNOSIS — I120 Hypertensive chronic kidney disease with stage 5 chronic kidney disease or end stage renal disease: Secondary | ICD-10-CM | POA: Diagnosis not present

## 2017-02-12 DIAGNOSIS — Z7902 Long term (current) use of antithrombotics/antiplatelets: Secondary | ICD-10-CM | POA: Diagnosis not present

## 2017-02-12 DIAGNOSIS — E875 Hyperkalemia: Secondary | ICD-10-CM | POA: Diagnosis not present

## 2017-02-12 DIAGNOSIS — Z79899 Other long term (current) drug therapy: Secondary | ICD-10-CM | POA: Diagnosis not present

## 2017-02-12 DIAGNOSIS — Z951 Presence of aortocoronary bypass graft: Secondary | ICD-10-CM | POA: Diagnosis not present

## 2017-02-12 DIAGNOSIS — Z9115 Patient's noncompliance with renal dialysis: Secondary | ICD-10-CM | POA: Diagnosis not present

## 2017-02-12 DIAGNOSIS — R0603 Acute respiratory distress: Secondary | ICD-10-CM | POA: Diagnosis not present

## 2017-02-12 DIAGNOSIS — R6510 Systemic inflammatory response syndrome (SIRS) of non-infectious origin without acute organ dysfunction: Secondary | ICD-10-CM | POA: Diagnosis present

## 2017-02-12 DIAGNOSIS — Z7982 Long term (current) use of aspirin: Secondary | ICD-10-CM | POA: Diagnosis not present

## 2017-02-12 DIAGNOSIS — E8770 Fluid overload, unspecified: Secondary | ICD-10-CM | POA: Diagnosis present

## 2017-02-12 DIAGNOSIS — R0602 Shortness of breath: Secondary | ICD-10-CM | POA: Diagnosis not present

## 2017-02-12 DIAGNOSIS — D631 Anemia in chronic kidney disease: Secondary | ICD-10-CM | POA: Diagnosis present

## 2017-02-12 DIAGNOSIS — I10 Essential (primary) hypertension: Secondary | ICD-10-CM | POA: Diagnosis not present

## 2017-02-12 DIAGNOSIS — E785 Hyperlipidemia, unspecified: Secondary | ICD-10-CM | POA: Diagnosis present

## 2017-02-12 DIAGNOSIS — E872 Acidosis: Secondary | ICD-10-CM | POA: Diagnosis not present

## 2017-02-12 DIAGNOSIS — R0902 Hypoxemia: Secondary | ICD-10-CM | POA: Diagnosis present

## 2017-02-12 DIAGNOSIS — R000 Tachycardia, unspecified: Secondary | ICD-10-CM | POA: Diagnosis present

## 2017-02-12 DIAGNOSIS — R0682 Tachypnea, not elsewhere classified: Secondary | ICD-10-CM | POA: Diagnosis present

## 2017-02-13 ENCOUNTER — Ambulatory Visit: Payer: Medicare Other | Admitting: Internal Medicine

## 2017-02-13 ENCOUNTER — Telehealth: Payer: Self-pay | Admitting: Internal Medicine

## 2017-02-13 DIAGNOSIS — I120 Hypertensive chronic kidney disease with stage 5 chronic kidney disease or end stage renal disease: Secondary | ICD-10-CM | POA: Diagnosis not present

## 2017-02-13 DIAGNOSIS — N186 End stage renal disease: Secondary | ICD-10-CM | POA: Diagnosis not present

## 2017-02-13 DIAGNOSIS — Z992 Dependence on renal dialysis: Secondary | ICD-10-CM | POA: Diagnosis not present

## 2017-02-13 NOTE — Telephone Encounter (Signed)
No charge. 

## 2017-02-21 DIAGNOSIS — Z992 Dependence on renal dialysis: Secondary | ICD-10-CM | POA: Diagnosis not present

## 2017-02-21 DIAGNOSIS — E875 Hyperkalemia: Secondary | ICD-10-CM | POA: Diagnosis not present

## 2017-02-21 DIAGNOSIS — N2581 Secondary hyperparathyroidism of renal origin: Secondary | ICD-10-CM | POA: Diagnosis not present

## 2017-02-21 DIAGNOSIS — N186 End stage renal disease: Secondary | ICD-10-CM | POA: Diagnosis not present

## 2017-02-21 DIAGNOSIS — D631 Anemia in chronic kidney disease: Secondary | ICD-10-CM | POA: Diagnosis not present

## 2017-02-25 DIAGNOSIS — Z992 Dependence on renal dialysis: Secondary | ICD-10-CM | POA: Diagnosis not present

## 2017-02-25 DIAGNOSIS — N2581 Secondary hyperparathyroidism of renal origin: Secondary | ICD-10-CM | POA: Diagnosis not present

## 2017-02-25 DIAGNOSIS — E875 Hyperkalemia: Secondary | ICD-10-CM | POA: Diagnosis not present

## 2017-02-25 DIAGNOSIS — D631 Anemia in chronic kidney disease: Secondary | ICD-10-CM | POA: Diagnosis not present

## 2017-02-25 DIAGNOSIS — N186 End stage renal disease: Secondary | ICD-10-CM | POA: Diagnosis not present

## 2017-03-02 DIAGNOSIS — Z992 Dependence on renal dialysis: Secondary | ICD-10-CM | POA: Diagnosis not present

## 2017-03-02 DIAGNOSIS — E875 Hyperkalemia: Secondary | ICD-10-CM | POA: Diagnosis not present

## 2017-03-02 DIAGNOSIS — N186 End stage renal disease: Secondary | ICD-10-CM | POA: Diagnosis not present

## 2017-03-02 DIAGNOSIS — N2581 Secondary hyperparathyroidism of renal origin: Secondary | ICD-10-CM | POA: Diagnosis not present

## 2017-03-02 DIAGNOSIS — D631 Anemia in chronic kidney disease: Secondary | ICD-10-CM | POA: Diagnosis not present

## 2017-03-04 ENCOUNTER — Emergency Department (HOSPITAL_COMMUNITY): Payer: Medicare Other

## 2017-03-04 ENCOUNTER — Inpatient Hospital Stay (HOSPITAL_COMMUNITY)
Admission: EM | Admit: 2017-03-04 | Discharge: 2017-03-06 | DRG: 280 | Disposition: A | Discharge status: Home / Self Care | Payer: Medicare Other | Attending: Internal Medicine | Admitting: Internal Medicine

## 2017-03-04 ENCOUNTER — Other Ambulatory Visit: Payer: Self-pay

## 2017-03-04 ENCOUNTER — Encounter (HOSPITAL_COMMUNITY): Payer: Self-pay | Admitting: Emergency Medicine

## 2017-03-04 DIAGNOSIS — N320 Bladder-neck obstruction: Secondary | ICD-10-CM | POA: Diagnosis present

## 2017-03-04 DIAGNOSIS — Z8679 Personal history of other diseases of the circulatory system: Secondary | ICD-10-CM

## 2017-03-04 DIAGNOSIS — R748 Abnormal levels of other serum enzymes: Secondary | ICD-10-CM | POA: Diagnosis not present

## 2017-03-04 DIAGNOSIS — N400 Enlarged prostate without lower urinary tract symptoms: Secondary | ICD-10-CM | POA: Diagnosis present

## 2017-03-04 DIAGNOSIS — R338 Other retention of urine: Secondary | ICD-10-CM | POA: Diagnosis not present

## 2017-03-04 DIAGNOSIS — Z955 Presence of coronary angioplasty implant and graft: Secondary | ICD-10-CM

## 2017-03-04 DIAGNOSIS — I5032 Chronic diastolic (congestive) heart failure: Secondary | ICD-10-CM | POA: Diagnosis not present

## 2017-03-04 DIAGNOSIS — F32A Depression, unspecified: Secondary | ICD-10-CM | POA: Diagnosis present

## 2017-03-04 DIAGNOSIS — Z7982 Long term (current) use of aspirin: Secondary | ICD-10-CM

## 2017-03-04 DIAGNOSIS — I25710 Atherosclerosis of autologous vein coronary artery bypass graft(s) with unstable angina pectoris: Secondary | ICD-10-CM

## 2017-03-04 DIAGNOSIS — D631 Anemia in chronic kidney disease: Secondary | ICD-10-CM | POA: Diagnosis not present

## 2017-03-04 DIAGNOSIS — G3184 Mild cognitive impairment, so stated: Secondary | ICD-10-CM | POA: Diagnosis present

## 2017-03-04 DIAGNOSIS — I120 Hypertensive chronic kidney disease with stage 5 chronic kidney disease or end stage renal disease: Secondary | ICD-10-CM | POA: Diagnosis not present

## 2017-03-04 DIAGNOSIS — G4733 Obstructive sleep apnea (adult) (pediatric): Secondary | ICD-10-CM | POA: Diagnosis not present

## 2017-03-04 DIAGNOSIS — N186 End stage renal disease: Secondary | ICD-10-CM

## 2017-03-04 DIAGNOSIS — I200 Unstable angina: Secondary | ICD-10-CM | POA: Diagnosis not present

## 2017-03-04 DIAGNOSIS — D649 Anemia, unspecified: Secondary | ICD-10-CM | POA: Diagnosis present

## 2017-03-04 DIAGNOSIS — R7989 Other specified abnormal findings of blood chemistry: Secondary | ICD-10-CM

## 2017-03-04 DIAGNOSIS — F419 Anxiety disorder, unspecified: Secondary | ICD-10-CM | POA: Diagnosis not present

## 2017-03-04 DIAGNOSIS — R52 Pain, unspecified: Secondary | ICD-10-CM

## 2017-03-04 DIAGNOSIS — R079 Chest pain, unspecified: Secondary | ICD-10-CM | POA: Diagnosis present

## 2017-03-04 DIAGNOSIS — I10 Essential (primary) hypertension: Secondary | ICD-10-CM | POA: Diagnosis not present

## 2017-03-04 DIAGNOSIS — E875 Hyperkalemia: Secondary | ICD-10-CM | POA: Diagnosis not present

## 2017-03-04 DIAGNOSIS — R0789 Other chest pain: Secondary | ICD-10-CM

## 2017-03-04 DIAGNOSIS — Z992 Dependence on renal dialysis: Secondary | ICD-10-CM

## 2017-03-04 DIAGNOSIS — R778 Other specified abnormalities of plasma proteins: Secondary | ICD-10-CM | POA: Diagnosis present

## 2017-03-04 DIAGNOSIS — E785 Hyperlipidemia, unspecified: Secondary | ICD-10-CM | POA: Diagnosis not present

## 2017-03-04 DIAGNOSIS — Z951 Presence of aortocoronary bypass graft: Secondary | ICD-10-CM

## 2017-03-04 DIAGNOSIS — Z888 Allergy status to other drugs, medicaments and biological substances status: Secondary | ICD-10-CM | POA: Diagnosis not present

## 2017-03-04 DIAGNOSIS — I151 Hypertension secondary to other renal disorders: Secondary | ICD-10-CM

## 2017-03-04 DIAGNOSIS — Z823 Family history of stroke: Secondary | ICD-10-CM | POA: Diagnosis not present

## 2017-03-04 DIAGNOSIS — D696 Thrombocytopenia, unspecified: Secondary | ICD-10-CM | POA: Diagnosis not present

## 2017-03-04 DIAGNOSIS — Z7902 Long term (current) use of antithrombotics/antiplatelets: Secondary | ICD-10-CM

## 2017-03-04 DIAGNOSIS — E7800 Pure hypercholesterolemia, unspecified: Secondary | ICD-10-CM | POA: Diagnosis not present

## 2017-03-04 DIAGNOSIS — N2581 Secondary hyperparathyroidism of renal origin: Secondary | ICD-10-CM | POA: Diagnosis not present

## 2017-03-04 DIAGNOSIS — Z9115 Patient's noncompliance with renal dialysis: Secondary | ICD-10-CM

## 2017-03-04 DIAGNOSIS — Z87891 Personal history of nicotine dependence: Secondary | ICD-10-CM

## 2017-03-04 DIAGNOSIS — Z8249 Family history of ischemic heart disease and other diseases of the circulatory system: Secondary | ICD-10-CM

## 2017-03-04 DIAGNOSIS — F0390 Unspecified dementia without behavioral disturbance: Secondary | ICD-10-CM | POA: Diagnosis not present

## 2017-03-04 DIAGNOSIS — I679 Cerebrovascular disease, unspecified: Secondary | ICD-10-CM | POA: Diagnosis present

## 2017-03-04 DIAGNOSIS — F329 Major depressive disorder, single episode, unspecified: Secondary | ICD-10-CM | POA: Diagnosis present

## 2017-03-04 DIAGNOSIS — I2510 Atherosclerotic heart disease of native coronary artery without angina pectoris: Secondary | ICD-10-CM | POA: Diagnosis present

## 2017-03-04 DIAGNOSIS — I214 Non-ST elevation (NSTEMI) myocardial infarction: Secondary | ICD-10-CM | POA: Diagnosis not present

## 2017-03-04 DIAGNOSIS — N2889 Other specified disorders of kidney and ureter: Secondary | ICD-10-CM

## 2017-03-04 DIAGNOSIS — K219 Gastro-esophageal reflux disease without esophagitis: Secondary | ICD-10-CM | POA: Diagnosis present

## 2017-03-04 DIAGNOSIS — N185 Chronic kidney disease, stage 5: Secondary | ICD-10-CM | POA: Diagnosis not present

## 2017-03-04 DIAGNOSIS — I132 Hypertensive heart and chronic kidney disease with heart failure and with stage 5 chronic kidney disease, or end stage renal disease: Secondary | ICD-10-CM | POA: Diagnosis present

## 2017-03-04 DIAGNOSIS — Z9861 Coronary angioplasty status: Secondary | ICD-10-CM

## 2017-03-04 DIAGNOSIS — R531 Weakness: Secondary | ICD-10-CM | POA: Diagnosis not present

## 2017-03-04 DIAGNOSIS — I21A1 Myocardial infarction type 2: Secondary | ICD-10-CM | POA: Diagnosis not present

## 2017-03-04 DIAGNOSIS — I351 Nonrheumatic aortic (valve) insufficiency: Secondary | ICD-10-CM | POA: Diagnosis not present

## 2017-03-04 DIAGNOSIS — Z8673 Personal history of transient ischemic attack (TIA), and cerebral infarction without residual deficits: Secondary | ICD-10-CM | POA: Diagnosis not present

## 2017-03-04 DIAGNOSIS — D638 Anemia in other chronic diseases classified elsewhere: Secondary | ICD-10-CM | POA: Diagnosis not present

## 2017-03-04 DIAGNOSIS — I252 Old myocardial infarction: Secondary | ICD-10-CM | POA: Diagnosis not present

## 2017-03-04 DIAGNOSIS — Z91013 Allergy to seafood: Secondary | ICD-10-CM

## 2017-03-04 LAB — PHOSPHORUS: PHOSPHORUS: 5 mg/dL — AB (ref 2.5–4.6)

## 2017-03-04 LAB — BASIC METABOLIC PANEL
Anion gap: 17 — ABNORMAL HIGH (ref 5–15)
BUN: 37 mg/dL — AB (ref 6–20)
CALCIUM: 6.8 mg/dL — AB (ref 8.9–10.3)
CO2: 22 mmol/L (ref 22–32)
Chloride: 93 mmol/L — ABNORMAL LOW (ref 101–111)
Creatinine, Ser: 14.24 mg/dL — ABNORMAL HIGH (ref 0.61–1.24)
GFR calc Af Amer: 4 mL/min — ABNORMAL LOW (ref >60)
GFR, EST NON AFRICAN AMERICAN: 3 mL/min — AB (ref >60)
GLUCOSE: 78 mg/dL (ref 65–99)
Potassium: 4.3 mmol/L (ref 3.5–5.1)
Sodium: 132 mmol/L — ABNORMAL LOW (ref 135–145)

## 2017-03-04 LAB — I-STAT TROPONIN, ED: TROPONIN I, POC: 0.41 ng/mL — AB (ref 0.00–0.08)

## 2017-03-04 LAB — CBC
HEMATOCRIT: 24 % — AB (ref 39.0–52.0)
Hemoglobin: 8 g/dL — ABNORMAL LOW (ref 13.0–17.0)
MCH: 32.4 pg (ref 26.0–34.0)
MCHC: 33.3 g/dL (ref 30.0–36.0)
MCV: 97.2 fL (ref 78.0–100.0)
Platelets: 83 10*3/uL — ABNORMAL LOW (ref 150–400)
RBC: 2.47 MIL/uL — ABNORMAL LOW (ref 4.22–5.81)
RDW: 13.5 % (ref 11.5–15.5)
WBC: 7.9 10*3/uL (ref 4.0–10.5)

## 2017-03-04 LAB — APTT
aPTT: 35 seconds (ref 24–36)
aPTT: 45 seconds — ABNORMAL HIGH (ref 24–36)

## 2017-03-04 LAB — MAGNESIUM: Magnesium: 2.3 mg/dL (ref 1.7–2.4)

## 2017-03-04 LAB — PROTIME-INR
INR: 1.48
Prothrombin Time: 17.8 seconds — ABNORMAL HIGH (ref 11.4–15.2)

## 2017-03-04 LAB — TROPONIN I
Troponin I: 5.11 ng/mL (ref <0.03)
Troponin I: 10.36 ng/mL (ref <0.03)

## 2017-03-04 LAB — BRAIN NATRIURETIC PEPTIDE: B Natriuretic Peptide: 960.1 pg/mL — ABNORMAL HIGH (ref 0.0–100.0)

## 2017-03-04 MED ORDER — ARGATROBAN 50 MG/50ML IV SOLN
0.6000 ug/kg/min | INTRAVENOUS | Status: DC
  Administered 2017-03-04: 0.5 ug/kg/min via INTRAVENOUS
  Administered 2017-03-05: 0.6 ug/kg/min via INTRAVENOUS
  Filled 2017-03-04 (×2): qty 50

## 2017-03-04 MED ORDER — SODIUM CHLORIDE 0.9% FLUSH
3.0000 mL | Freq: Two times a day (BID) | INTRAVENOUS | Status: DC
  Administered 2017-03-04: 3 mL via INTRAVENOUS

## 2017-03-04 MED ORDER — MEMANTINE HCL 5 MG PO TABS
10.0000 mg | ORAL_TABLET | Freq: Two times a day (BID) | ORAL | Status: DC
  Administered 2017-03-04 – 2017-03-06 (×4): 10 mg via ORAL
  Filled 2017-03-04: qty 1
  Filled 2017-03-04 (×4): qty 2
  Filled 2017-03-04: qty 1

## 2017-03-04 MED ORDER — SODIUM CHLORIDE 0.9 % IV SOLN
2.0000 g | Freq: Once | INTRAVENOUS | Status: AC
  Administered 2017-03-04: 2 g via INTRAVENOUS
  Filled 2017-03-04 (×2): qty 20

## 2017-03-04 MED ORDER — HEPARIN SODIUM (PORCINE) 5000 UNIT/ML IJ SOLN: 5000.0000 [IU] | Freq: Three times a day (TID) | INTRAMUSCULAR | Status: DC

## 2017-03-04 MED ORDER — FENTANYL CITRATE (PF) 100 MCG/2ML IJ SOLN: 50.0000 ug | Freq: Once | INTRAMUSCULAR | Status: DC

## 2017-03-04 MED ORDER — MORPHINE SULFATE (PF) 4 MG/ML IV SOLN
4.0000 mg | Freq: Once | INTRAVENOUS | Status: AC
  Administered 2017-03-04: 4 mg via INTRAVENOUS
  Filled 2017-03-04: qty 1

## 2017-03-04 MED ORDER — LANTHANUM CARBONATE 500 MG PO CHEW
2000.0000 mg | CHEWABLE_TABLET | Freq: Three times a day (TID) | ORAL | Status: DC
  Administered 2017-03-04 – 2017-03-06 (×3): 2000 mg via ORAL
  Filled 2017-03-04 (×3): qty 4

## 2017-03-04 MED ORDER — HYDRALAZINE HCL 20 MG/ML IJ SOLN: 5.0000 mg | Freq: Three times a day (TID) | INTRAMUSCULAR | Status: DC | PRN

## 2017-03-04 MED ORDER — ARGATROBAN 50 MG/50ML IV SOLN
0.5000 ug/kg/min | INTRAVENOUS | Status: DC
  Filled 2017-03-04: qty 50

## 2017-03-04 MED ORDER — DOXERCALCIFEROL 4 MCG/2ML IV SOLN
3.0000 ug | Freq: Once | INTRAVENOUS | Status: AC
  Administered 2017-03-05: 3 ug via INTRAVENOUS
  Filled 2017-03-04: qty 2

## 2017-03-04 MED ORDER — CINACALCET HCL 30 MG PO TABS: 90.0000 mg | ORAL_TABLET | Freq: Every day | ORAL | Status: DC

## 2017-03-04 MED ORDER — ASPIRIN 81 MG PO CHEW
81.0000 mg | CHEWABLE_TABLET | ORAL | Status: AC
  Administered 2017-03-05: 81 mg via ORAL
  Filled 2017-03-04: qty 1

## 2017-03-04 MED ORDER — CLOPIDOGREL BISULFATE 75 MG PO TABS
75.0000 mg | ORAL_TABLET | Freq: Every day | ORAL | Status: DC
  Administered 2017-03-04 – 2017-03-05 (×2): 75 mg via ORAL
  Filled 2017-03-04 (×2): qty 1

## 2017-03-04 MED ORDER — ALPRAZOLAM 0.25 MG PO TABS: 0.2500 mg | ORAL_TABLET | Freq: Two times a day (BID) | ORAL | Status: DC | PRN

## 2017-03-04 MED ORDER — SODIUM CHLORIDE 0.9 % IV SOLN
INTRAVENOUS | Status: DC
  Administered 2017-03-04: 17:00:00 via INTRAVENOUS

## 2017-03-04 MED ORDER — NITROGLYCERIN 2 % TD OINT: 1.0000 [in_us] | TOPICAL_OINTMENT | Freq: Once | TRANSDERMAL | Status: DC

## 2017-03-04 MED ORDER — SODIUM CHLORIDE 0.9 % IV SOLN: 250.0000 mL | INTRAVENOUS | Status: DC | PRN

## 2017-03-04 MED ORDER — DARBEPOETIN ALFA 100 MCG/0.5ML IJ SOSY
100.0000 ug | PREFILLED_SYRINGE | INTRAMUSCULAR | Status: AC
  Administered 2017-03-05: 100 ug via INTRAVENOUS
  Filled 2017-03-04: qty 0.5

## 2017-03-04 MED ORDER — TERAZOSIN HCL 5 MG PO CAPS
10.0000 mg | ORAL_CAPSULE | Freq: Every day | ORAL | Status: DC
  Administered 2017-03-04 – 2017-03-05 (×2): 10 mg via ORAL
  Filled 2017-03-04 (×3): qty 2

## 2017-03-04 MED ORDER — FEBUXOSTAT 40 MG PO TABS: 40.0000 mg | ORAL_TABLET | Freq: Every day | ORAL | Status: DC | PRN

## 2017-03-04 MED ORDER — ONDANSETRON HCL 4 MG/2ML IJ SOLN: 4.0000 mg | Freq: Four times a day (QID) | INTRAMUSCULAR | Status: DC | PRN

## 2017-03-04 MED ORDER — ACETAMINOPHEN 325 MG PO TABS: 650.0000 mg | ORAL_TABLET | ORAL | Status: DC | PRN

## 2017-03-04 MED ORDER — SODIUM CHLORIDE 0.9% FLUSH
3.0000 mL | INTRAVENOUS | Status: DC | PRN
  Filled 2017-03-04: qty 3

## 2017-03-04 MED ORDER — SODIUM CHLORIDE 0.9 % IV SOLN
INTRAVENOUS | Status: DC
  Administered 2017-03-05: 06:00:00 via INTRAVENOUS

## 2017-03-04 MED ORDER — NITROGLYCERIN 2 % TD OINT
0.5000 [in_us] | TOPICAL_OINTMENT | Freq: Once | TRANSDERMAL | Status: AC
  Administered 2017-03-04: 0.5 [in_us] via TOPICAL
  Filled 2017-03-04: qty 1

## 2017-03-04 MED ORDER — METOPROLOL TARTRATE 5 MG/5ML IV SOLN
5.0000 mg | Freq: Once | INTRAVENOUS | Status: AC
  Administered 2017-03-04: 5 mg via INTRAVENOUS
  Filled 2017-03-04: qty 5

## 2017-03-04 NOTE — Consult Note (Signed)
Cardiology Consultation:   Patient ID: PHILLIPPE ORLICK; [id number]; [birth year]   Admit date: 03/04/2017 Date of Consult: 03/04/2017  Primary Care Provider: Corwin Levins, MD Primary Cardiologist: Dr. Allyson Sabal Primary Electrophysiologist:     Patient Profile:   [name] is a 56 y.o. male with a hx of CAD s/p CABG (03/2011: LIMA to LAD, SVG to OM1, OM2 and posterior lateral branch), ESRD on HD, difficult to control HTN, hx of stroke, HLD, GERD, and anemia who is being seen today for the evaluation of chest pain at dialysis at the request of Dr. Donnald Garre.  History of Present Illness:   Mr. Wampole is known to this service and last saw Dr. Allyson Sabal on 11/04/16 in clinic. At that time, he was in his usual state of health. He started HD 12/2013 and had a stroke around the same time. He was started on lipitor at that visit. HTN was well-controlled, no medication changes.    CAD History: Coronary artery bypass grafting times four in January 2013. He has a LIMA to the LAD, SVG to the PL branch of the RCA a sequential SVG to the OM1 and 2.  06/2011 Cath revealed remarkably severe native coronary artery and  vein graft disease in the distribution of all 3 coronary arteries. A clear-cut culprit was not obvious but the distal limb of the saphenous vein graft to the OM1/OM 2 appeared to be the most likely candidate.  Successful PTCA was completed via the SVG of OM at anastamosis site (very small vessel) with 2.0 x 12 Emerge balloon dilated up to 2.03 mm with inflations up to 3 minutes: 99% to 20 - 30%.  Lopressor was increased to 50 mg every 8 hours.  Patient was hydrated vigorously after the first PCI procedure in 07/02/2011 patient's serum creatinine was 3.8. Then decided that the patient needed a back to the Cath Lab for additional PCI to the mid-distal LAD.  This was completed on 07/03/2011: successful PCI/Stent of mid-distal LAD via LIMA graft with diffuse stenosis of 90%, 80%, and 70% to 0 with 2.25x26  mm Resolute DES stent post dilated to 2.43 mm And PTCA of apical 95% stenosis to 50 - 60% very small distal vessel not amenable to stenting.  Myoview in 06/28/13 was low risk, echo at that time with normal LVEF.  Pt was brought to [facility] after complaints of chest pain while at HD today. After about 1 hr of dialysis, he reported shortness of breath followed by chest pain that was sharp and mid-sternal without radiation. He had associated nausea and 1 episode of vomiting, no diaphoresis. SOB was relieved with supplemental O2. En route, he received 324 mg ASA and SL nitro x 2 without relief of chest pain. He reports similar pain as when he had his first MI. Position and deep inspiration does not change the pain. He missed his Monday dialysis treatment, last full HD was on Friday 02/27/17.  Pt is a poor historian and changes his story several times during the interview. He has known disease and known to miss dialysis. It is unclear if he has been compliant with home medications. He states his chest pain was relieved with morphine. He states his chest pain was 5/10 before morphine and is now a 5/10, but relieved.   His last hospitalization was 02/12/17 for missed HD sessions.   Past Medical History:  Diagnosis Date  . ACHALASIA   . ANEMIA-NOS   . CAD (coronary artery disease), 3 vessel significant disease.    Marland Kitchen  CEREBROVASCULAR ACCIDENT, HX OF   . CHF (congestive heart failure) (HCC)   . CKD (chronic kidney disease) stage 4, GFR 15-29 ml/min (HCC)   . Constipation   . DEGENERATIVE JOINT DISEASE, SHOULDER    knee  . DEPRESSION    2006- not depressed any longer  . Family history of adverse reaction to anesthesia    Mother, hard to awaken  . GERD    no meds required  . Gout    takes Uloric daily  . H/O hiatal hernia   . Headache(784.0)   . Heart attack (HCC)   . History of colon polyps   . History of diastolic dysfunction, grade 2 by echo   . HYPERLIPIDEMIA    takes crestor daily  .  Hyperlipidemia   . HYPERTENSION    takes Norvasc,Catapress,Hydralazine,Imdur,and Metoprolol daily  . Impaired glucose tolerance   . Insomnia    takes Ambien nightly  . NSTEMI (non-ST elevated myocardial infarction), 06/30/11 07/01/2011  . RENAL CALCULUS, HX OF   . RENAL INSUFFICIENCY    12/2013- TThSat  . S/P coronary artery stent placement, PTCA/DES Resolute to mid-LAD via the LIMA graft, and PTCA of apical 95% stenosis 07/05/11 07/04/2011  . S/P PTCA (percutaneous transluminal coronary angioplasty), 06/30/11 07/01/2011  . Seizures (HCC)    INSULIN INDUCED  . Sleep apnea    SLEEP STUDY IN [place] , NO MACHINE YET  1 YEAR  . Stroke (HCC)    93/2005/2006/2007;left sided weakness.  1993 12/2013    Past Surgical History:  Procedure Laterality Date  . ARCH AORTOGRAM N/A 02/19/2012   Procedure: ARCH AORTOGRAM;  Surgeon: Fransisco Hertz, MD;  Location: [facility] CATH LAB;  Service: Cardiovascular;  Laterality: N/A;  . AV FISTULA PLACEMENT  12/02/2011   Procedure: ARTERIOVENOUS (AV) FISTULA CREATION;  Surgeon: Fransisco Hertz, MD;  Location: [facility] OR;  Service: Vascular;  Laterality: Left;  Ultrasound Guided  . AV FISTULA PLACEMENT Left 06/08/2012   Procedure: ARTERIOVENOUS (AV) FISTULA CREATION;  Surgeon: Fransisco Hertz, MD;  Location: [facility] OR;  Service: Vascular;  Laterality: Left;  . BASCILIC VEIN TRANSPOSITION Left 09/06/2014   Procedure: FIRST STAGE BASILIC VEIN TRANSPOSITION ;  Surgeon: Fransisco Hertz, MD;  Location: [facility] OR;  Service: Vascular;  Laterality: Left;  . BASCILIC VEIN TRANSPOSITION Left 12/06/2014   Procedure: SECOND STAGE BRACHIAL VEIN TRANSPOSITION ;  Surgeon: Fransisco Hertz, MD;  Location: [facility] OR;  Service: Vascular;  Laterality: Left;  . CARDIAC CATHETERIZATION    . COLONOSCOPY    . CORONARY ANGIOPLASTY     06/30/11   AT [facility]  . CORONARY ARTERY BYPASS GRAFT  04/01/2011   Procedure: CORONARY ARTERY BYPASS GRAFTING (CABG);  Surgeon: Kathlee Nations Suann Larry, MD;  Location: [facility] OR;  Service: Open Heart Surgery;   Laterality: N/A;  . HERNIA REPAIR  2013  . INSERTION OF DIALYSIS CATHETER  04/01/2011   Procedure: INSERTION OF DIALYSIS CATHETER;  Surgeon: Nilda Simmer, MD;  Location: [facility] OR;  Service: Vascular;  Laterality: N/A;  . KNEE SURGERY Right 01/07/2011   arthroscopy  . LEFT HEART CATHETERIZATION WITH CORONARY ANGIOGRAM N/A 03/28/2011   Procedure: LEFT HEART CATHETERIZATION WITH CORONARY ANGIOGRAM;  Surgeon: Lennette Bihari, MD;  Location: [facility] CATH LAB;  Service: Cardiovascular;  Laterality: N/A;  pending creatnine  . LEFT HEART CATHETERIZATION WITH CORONARY/GRAFT ANGIOGRAM N/A 06/30/2011   Procedure: LEFT HEART CATHETERIZATION WITH Isabel Caprice;  Surgeon: Thurmon Fair, MD;  Location: [facility] CATH LAB;  Service: Cardiovascular;  Laterality:  N/A;  . LIGATION OF ARTERIOVENOUS  FISTULA Left 06/08/2012   Procedure: LIGATION OF ARTERIOVENOUS  FISTULA;  Surgeon: Fransisco Hertz, MD;  Location: [facility] OR;  Service: Vascular;  Laterality: Left;  . NM MYOCAR PERF EJECTION FRACTION  06/25/2011   There is evidence of mild ischemia in the basal inferolateral and mid inferolateral regions. (Extent 7 %) The post-stress ejection fraction is 44%. There is mild hypocontractility in the distal inferoapical segment. baseline T wave inversion is noted in leads I, avL, II, V2. This is a low risk scan. The present perfusion study suggests significant myocardial salvage with mild residual ischemia .  Marland Kitchen PERCUTANEOUS CORONARY INTERVENTION-BALLOON ONLY  06/30/2011   Procedure: PERCUTANEOUS CORONARY INTERVENTION-BALLOON ONLY;  Surgeon: Thurmon Fair, MD;  Location: [facility] CATH LAB;  Service: Cardiovascular;;  . PERCUTANEOUS CORONARY STENT INTERVENTION (PCI-S) N/A 07/04/2011   Procedure: PERCUTANEOUS CORONARY STENT INTERVENTION (PCI-S);  Surgeon: Lennette Bihari, MD;  Location: [facility] CATH LAB;  Service: Cardiovascular;  Laterality: N/A;  . REVISION OF ARTERIOVENOUS GORETEX GRAFT Left 12/06/2014   Procedure: REVISION OF ARTERIOVENOUS  GORETEX GRAFT USING 46mmx10cm;  Surgeon: Fransisco Hertz, MD;  Location: [facility] OR;  Service: Vascular;  Laterality: Left;  . STOMACH SURGERY  2009   achalasia  . SUBCLAVIAN STENT PLACEMENT  12/20/2011  . TONSILLECTOMY    . TONSILLECTOMY       Home Medications:  Prior to Admission medications   Medication Sig Start Date End Date Taking? Authorizing Provider  amLODipine (NORVASC) 5 MG tablet TAKE ONE TABLET BY MOUTH ONE TIME DAILY  12/08/16  Yes Runell Gess, MD  aspirin EC 81 MG EC tablet Take 1 tablet (81 mg total) by mouth daily. 07/07/11  Yes Dwana Melena, PA-C  cinacalcet (SENSIPAR) 90 MG tablet Take 90 mg by mouth at bedtime.   Yes [provider]  cloNIDine (CATAPRES) 0.2 MG tablet TAKE ONE TABLET BY MOUTH TWICE DAILY  12/08/16  Yes Runell Gess, MD  clopidogrel (PLAVIX) 75 MG tablet Take 1 tablet (75 mg total) by mouth daily. 10/14/16  Yes Levert Feinstein, MD  ketoconazole (NIZORAL) 2 % cream Apply 1 application topically daily. 12/18/16  Yes Vivi Barrack, DPM  lanthanum ([name]) 1000 MG chewable tablet Chew 2 tablets (2,000 mg total) by mouth 3 (three) times daily with meals. Yearly physical due in July must see MD for refills 09/10/15  Yes Corwin Levins, MD  lidocaine-prilocaine (EMLA) cream Apply 1 application topically as needed. Apply as needed for dialysis site   Yes [provider]  memantine (NAMENDA) 10 MG tablet Take 1 tablet (10 mg total) by mouth 2 (two) times daily. 03/20/16  Yes Levert Feinstein, MD  metoprolol (LOPRESSOR) 50 MG tablet Take 1 tablet (50 mg total) by mouth 2 (two) times daily. 09/13/14  Yes Corwin Levins, MD  NITROSTAT 0.4 MG SL tablet PLACE 1 TABLET UNDER TONGUE AS NEEDED FOR CHEST PAIN EVERY 5 MINUTES (MAX 3 DOSES) 10/02/15  Yes Runell Gess, MD  terazosin (HYTRIN) 10 MG capsule TAKE 1 CAPSULE BY MOUTH DAILY AT BEDTIME 11/11/16  Yes Corwin Levins, MD  atorvastatin (LIPITOR) 40 MG tablet Take 1 tablet (40 mg total) by mouth daily. 11/04/16  02/02/17  Runell Gess, MD  febuxostat (ULORIC) 40 MG tablet Take 40 mg by mouth daily as needed (gout).  05/13/11   Corwin Levins, MD  urea (GORDONS UREA) 40 % ointment Apply topically as needed. Patient not taking: Reported on 03/04/2017  12/18/16   Vivi Barrack, DPM  zolpidem (AMBIEN) 10 MG tablet TAKE ONE TABLET BY MOUTH AT BEDTIME AS NEEDED FOR SLEEP Patient not taking: Reported on 03/04/2017 11/12/16   Corwin Levins, MD    Inpatient Medications: Scheduled Meds:  Continuous Infusions:  PRN Meds:   Allergies:    Allergies  Allergen Reactions  . Shrimp [Shellfish Allergy] Shortness Of Breath  . Atorvastatin Other (See Comments)    weakness  . Insulins Other (See Comments)    Patient unsure as to the kind of insulin but states that it has previously caused seizures.  Most recent hospitalization (Jan 2013) received insulin but did not have reaction. LIKELY DUE TO SIGNIFICANT HYPOGLYCEMIA  . Simvastatin Other (See Comments)     abnormal liver tests  . Ultram [Tramadol] Other (See Comments)    Liver enzyme     Social History:   Social History   Socioeconomic History  . Marital status: Divorced    Spouse name: Not on file  . Number of children: 1  . Years of education: Bachelors  . Highest education level: Not on file  Social Needs  . Financial resource strain: Not on file  . Food insecurity - worry: Not on file  . Food insecurity - inability: Not on file  . Transportation needs - medical: Not on file  . Transportation needs - non-medical: Not on file  Occupational History  . Occupation: Disabled  Tobacco Use  . Smoking status: Never Smoker  . Smokeless tobacco: Former Neurosurgeon    Types: Chew  . Tobacco comment: chewed tobacco   Substance and Sexual Activity  . Alcohol use: Yes    Alcohol/week: 1.2 oz    Types: 2 Shots of liquor per week    Comment: rarely; beer  . Drug use: No  . Sexual activity: Not Currently    Partners: Male  Other Topics Concern  .  Not on file  Social History Narrative   Lives at home alone.   Right-handed.   Occasional caffeine use.    Family History:    Family History  Problem Relation Age of Onset  . Cancer Mother   . Heart disease Mother   . Hyperlipidemia Mother   . Hypertension Mother   . Alzheimer's disease Mother   . Cancer Father   . Heart disease Father   . Hypertension Other   . Stroke Other   . Hyperlipidemia Other      ROS:  Please see the history of present illness.  Review of Systems  Musculoskeletal: Positive for myalgias.    All other ROS reviewed and negative.     Physical Exam/Data:   Vitals:   03/04/17 0816 03/04/17 0820 03/04/17 1100 03/04/17 1115  BP:  (!) 166/92 (!) 148/79 (!) 150/95  Pulse:  (!) 106    Resp:  19 19 (!) 25  Temp:  98.5 F (36.9 C)    TempSrc:  Oral    SpO2: 98% 99%    Weight:  226 lb (102.5 kg)     No intake or output data in the 24 hours ending 03/04/17 1220 Filed Weights   03/04/17 0820  Weight: 226 lb (102.5 kg)   Body mass index is 30.65 kg/m.  General:  Well nourished, well developed, in no acute distress HEENT: normal Neck: no JVD Vascular: No carotid bruits  Cardiac:  normal S1, S2; RRR; no murmur Lungs:  Coarse sounds throughout, respirations unlabored Abd: soft, nontender, no hepatomegaly  Ext: no  edema Musculoskeletal:  No deformities, BUE and BLE strength normal and equal Skin: warm and dry  Neuro:  CNs 2-12 intact, no focal abnormalities noted Psych:  Normal affect   EKG:  The EKG was personally reviewed and demonstrates:  Sinus with PVCs Telemetry:  Telemetry was personally reviewed and demonstrates:  sinus  Relevant CV Studies:  Echo 12/31/13: Study Conclusions - Left ventricle: The cavity size was normal. Wall thickness was increased in a pattern of severe LVH. There was severe concentric hypertrophy. Systolic function was vigorous. The estimated ejection fraction was in the range of 65% to 70%. Wall motion  was normal; there were no regional wall motion abnormalities. Doppler parameters are consistent with abnormal left ventricular relaxation (grade 1 diastolic dysfunction). - Aortic valve: There was trivial regurgitation. Valve area (Vmax): 4.77 cm^2. - Left atrium: The atrium was moderately to severely dilated. - Right atrium: The atrium was mildly dilated. - Atrial septum: No defect or patent foramen ovale was identified. - Pericardium, extracardiac: A trivial pericardial effusion was identified posterior to the heart. Features were not consistent with tamponade physiology.  Impressions: - No cardiac source of emboli was indentified.   Myoview 06/28/13: Low risk stress nuclear study Mild anterior ischemia.  LV Wall Motion:  Severe Global LV dysfunction   Laboratory Data:  Chemistry Recent Labs  Lab 03/04/17 0958  NA 132*  K 4.3  CL 93*  CO2 22  GLUCOSE 78  BUN 37*  CREATININE 14.24*  CALCIUM 6.8*  GFRNONAA 3*  GFRAA 4*  ANIONGAP 17*    No results for input(s): PROT, ALBUMIN, AST, ALT, ALKPHOS, BILITOT in the last 168 hours. Hematology Recent Labs  Lab 03/04/17 0958  WBC 7.9  RBC 2.47*  HGB 8.0*  HCT 24.0*  MCV 97.2  MCH 32.4  MCHC 33.3  RDW 13.5  PLT 83*   Cardiac EnzymesNo results for input(s): TROPONINI in the last 168 hours.  Recent Labs  Lab 03/04/17 1006  TROPIPOC 0.41*    BNP Recent Labs  Lab 03/04/17 0958  BNP 960.1*    DDimer No results for input(s): DDIMER in the last 168 hours.  Radiology/Studies:  Dg Chest 2 View  Result Date: 03/04/2017 CLINICAL DATA:  Onset of mid sternal chest pain after receiving 1 hour hemodialysis. EXAM: CHEST  2 VIEW COMPARISON:  PA and lateral chest x-ray dated December 29, 2013 FINDINGS: The lungs are adequately inflated. There is no focal infiltrate. The interstitial markings are mildly increased. The cardiac silhouette is enlarged. The central pulmonary vascularity is prominent and less  distinct than on the previous study. The patient has undergone previous CABG. The bony thorax exhibits no acute abnormality. IMPRESSION: Findings are consistent with low-grade interstitial edema. Stable cardiomegaly. No alveolar pneumonia. Electronically Signed   By: David  SwazilandJordan M.D.   On: 03/04/2017 09:27    Assessment and Plan:   1. Chest pain, elevated troponin, known CAD - troponin 0.41 - pt is a poor historian and has missed at least one dialysis session - elevated troponin in the setting of ESRD and missed dialysis in a patient with known CAD is difficult to interpret - recommend admitting to hospital medicine and we will follow with an ischemic evaluation - will discuss myoview vs repeat heart catheterization with attending  2. HTN - pressures have been moderately elevated here in the setting of missing his last HD sessio and only dialyzing 1 hr today - continue home medications for now: noravasc, clonidine, and lorpressor  3. HLD -  continue statin  4. ESRD on HD - per renal - will defer fluid balance to nephrology   For questions or updates, please contact [facility] Please consult [email or web address] for contact info under Cardiology/STEMI.   Signed, Marcelino Duster, PA  03/04/2017 12:20 PM   I have seen and examined the patient along with Marcelino Duster, PA.  I have reviewed the chart, notes and new data.  I agree with PA's note.  Key new complaints: currently pain free, denies dyspnea Key examination changes: at least mild volume overload, left arm AVF, S4 gallop present Key new findings / data: ECG with T wave inversion I and aVL worse than at baseline and prolonged QTc; mildly abnormal initial troponin at 0.4. LDL 140-178 this year.  PLAN: There is evidence of chronic noncompliance with meds and with HD (and I suspect with diet as well). Seems to have unstable angina, but it is unclear whether this is due to increased demand from missing HD and anemia (Hgb 8,  down from 14 in August) or a true new coronary event due to new stenosis/unstable plaque. Will order a nuclear perfusion study, plan cath on Friday if there is a large area of ischemia/high risk study. His coronary anatomy is sure to be very complicated. It may be difficult to identify a culprit vessel solely based on angiographic appearance.  Thurmon Fair, MD, [facility] [phone] 03/04/2017, 1:45 PM

## 2017-03-04 NOTE — Progress Notes (Signed)
Critical lab Troponin 5.11 called to NP [facility], EKG done. Prolonged QTc. Patient asymptomatic. Vital signs stable, patient resting. Will continue to follow up with any orders.

## 2017-03-04 NOTE — ED Provider Notes (Signed)
[facility] EMERGENCY DEPARTMENT Provider Note   CSN: [id number] Arrival date & time: 03/04/17  0807     History   Chief Complaint Chief Complaint  Patient presents with  . Chest Pain    HPI [name] is a 56 y.o. 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 Elvis Coil), hyperlipidemia and hypertension resents the ED today for chest pain.  Patient states that he was 1 hour into an his hemodialysis treatment when he started becoming short of breath.  Shortly after that he started having sharp, mid sternal chest pain without radiation to his back, neck, jaw, or either shoulders/arms.  He had associated nausea with this.  No diaphoresis.  He is placed on oxygen which improved his shortness of breath.  He does not wear home O2.  He was transported by EMS where he received 324 mg aspirin and 2 sublingual nitroglycerin with no relief.  His current pain level is 6/10.  He states this is very similar to his presentation when he had his first MI except for not as sharp in characteristic.  The pain is not positional or pleuritic in nature.  He is not sure if it is exertional as he is not ambulated since the start of the pain.  Does admit to missing his Monday dialysis treatment.  Last full dialysis on Friday, 02/27/2017. No lower extremity swelling or pain. Patient is followed by [facility]. He was last seen by cardiology on 11/04/16. Last 2D echo 12/31/2013 shows EF 65-70%.  Last heart catheterization on 06/30/2011 by Dr. Royann Shivers followed by PCI.  Patient did undergo 4 vessel CABG on 04/01/2011 by Dr. Donata Clay (LIMA to LAD, SVG to PLA branch of the RCA, sequential SVG to OM1 and OM2).   HPI  Past Medical History:  Diagnosis Date  . ACHALASIA   . ANEMIA-NOS   . CAD (coronary artery disease), 3 vessel significant disease.    . CEREBROVASCULAR ACCIDENT, HX OF   . CHF (congestive heart failure) (HCC)   . CKD (chronic kidney disease)  stage 4, GFR 15-29 ml/min (HCC)   . Constipation   . DEGENERATIVE JOINT DISEASE, SHOULDER    knee  . DEPRESSION    2006- not depressed any longer  . Family history of adverse reaction to anesthesia    Mother, hard to awaken  . GERD    no meds required  . Gout    takes Uloric daily  . H/O hiatal hernia   . Headache(784.0)   . Heart attack (HCC)   . History of colon polyps   . History of diastolic dysfunction, grade 2 by echo   . HYPERLIPIDEMIA    takes crestor daily  . Hyperlipidemia   . HYPERTENSION    takes Norvasc,Catapress,Hydralazine,Imdur,and Metoprolol daily  . Impaired glucose tolerance   . Insomnia    takes Ambien nightly  . NSTEMI (non-ST elevated myocardial infarction), 06/30/11 07/01/2011  . RENAL CALCULUS, HX OF   . RENAL INSUFFICIENCY    12/2013- TThSat  . S/P coronary artery stent placement, PTCA/DES Resolute to mid-LAD via the LIMA graft, and PTCA of apical 95% stenosis 07/05/11 07/04/2011  . S/P PTCA (percutaneous transluminal coronary angioplasty), 06/30/11 07/01/2011  . Seizures (HCC)    INSULIN INDUCED  . Sleep apnea    SLEEP STUDY IN [place] , NO MACHINE YET  1 YEAR  . Stroke (HCC)    93/2005/2006/2007;left sided weakness.  1993 12/2013  Patient Active Problem List   Diagnosis Date Noted  . Elevated PSA 11/04/2016  . OSA (obstructive sleep apnea) 01/08/2016  . Mild cognitive impairment 12/11/2015  . Sleep disorder breathing 10/31/2015  . Urethritis 07/17/2015  . End stage renal disease on dialysis (HCC) 12/06/2014  . Pulse irregularity 09/13/2014  . History of stroke 04/15/2014  . Nail disease 01/22/2014  . CVD (cerebrovascular disease) 12/29/2013  . Acute ischemic left ACA stroke (HCC) 12/29/2013  . Abnormality of gait 12/22/2013  . Exertional angina (HCC) 06/24/2013  . Venereal disease, unspecified 11/10/2012  . ESRD on dialysis (HCC) 07/09/2012  . Left-sided weakness 07/02/2012  . Subclavian artery stenosis (HCC) 03/26/2012  . Left leg  weakness 02/04/2012  . Itching 02/04/2012  . Mouth abscess 11/11/2011  . Increased prostate specific antigen (PSA) velocity 11/11/2011  . Right ankle pain 09/10/2011  . S/P coronary artery stent placement, PTCA/DES Resolute to mid-LAD via the LIMA graft, and PTCA of apical 95% stenosis 06/2011 07/04/2011  . History of diastolic dysfunction, grade 2 by echo 07/03/2011  . S/P PTCA (percutaneous transluminal coronary angioplasty), 06/30/11 07/01/2011  . NSTEMI (non-ST elevated myocardial infarction), 06/30/11 07/01/2011  . Stable angina: with acute T wave inversion.And unstable angina 06/30/2011  . Impaired glucose tolerance 05/13/2011  . 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 Preventative health care 05/10/2011  . S/P CABG x 4: 04/01/11-(LIMA to LAD, SVG to PL branch of RCA, Sequential SVG to OM1/2) 04/07/2011  . CAD, 3 vessel significant disease at cath this admission. underwent CABG in Jan 2013 03/28/2011  . Gout 03/27/2011  . Anemia 05/22/2008  . PERIPHERAL VASCULAR DISEASE 05/22/2008  . ACHALASIA 05/22/2008  . GERD 05/22/2008  . Hyperlipidemia 10/25/2006  . Depression 10/25/2006  . Essential hypertension 10/25/2006  . HEMORRHOIDS 10/25/2006  . ALLERGIC RHINITIS 10/25/2006  . DEGENERATIVE JOINT DISEASE, SHOULDER 10/25/2006  . Headache(784.0) 10/25/2006  . CVA (cerebral infarction) 10/25/2006  . RENAL CALCULUS, HX OF 10/25/2006    Past Surgical History:  Procedure Laterality Date  . ARCH AORTOGRAM N/A 02/19/2012   Procedure: ARCH AORTOGRAM;  Surgeon: Fransisco Hertz, MD;  Location: [facility] CATH LAB;  Service: Cardiovascular;  Laterality: N/A;  . AV FISTULA PLACEMENT  12/02/2011   Procedure: ARTERIOVENOUS (AV) FISTULA CREATION;  Surgeon: Fransisco Hertz, MD;  Location: [facility] OR;  Service: Vascular;  Laterality: Left;  Ultrasound Guided  . AV FISTULA PLACEMENT Left 06/08/2012   Procedure: ARTERIOVENOUS (AV) FISTULA CREATION;  Surgeon: Fransisco Hertz, MD;  [facility];  Service: Vascular;   Laterality: Left;  . BASCILIC VEIN TRANSPOSITION Left 09/06/2014   Procedure: FIRST STAGE BASILIC VEIN TRANSPOSITION ;  Surgeon: Fransisco Hertz, MD;  Location: [facility] OR;  Service: Vascular;  Laterality: Left;  . BASCILIC VEIN TRANSPOSITION Left 12/06/2014   Procedure: SECOND STAGE BRACHIAL VEIN TRANSPOSITION ;  Surgeon: Fransisco Hertz, MD;  Location: [facility] OR;  Service: Vascular;  Laterality: Left;  . CARDIAC CATHETERIZATION    . COLONOSCOPY    . CORONARY ANGIOPLASTY     06/30/11   AT [facility]  . CORONARY ARTERY BYPASS GRAFT  04/01/2011   Procedure: CORONARY ARTERY BYPASS GRAFTING (CABG);  Surgeon: Kathlee Nations Suann Larry, MD;  Location: [facility] OR;  Service: Open Heart Surgery;  Laterality: N/A;  . HERNIA REPAIR  2013  . INSERTION OF DIALYSIS CATHETER  04/01/2011   Procedure: INSERTION OF DIALYSIS CATHETER;  Surgeon: Nilda Simmer, MD;  Location: [facility] OR;  Service: Vascular;  Laterality: N/A;  . KNEE SURGERY  Right 01/07/2011   arthroscopy  . LEFT HEART CATHETERIZATION WITH CORONARY ANGIOGRAM N/A 03/28/2011   Procedure: LEFT HEART CATHETERIZATION WITH CORONARY ANGIOGRAM;  Surgeon: Lennette Bihari, MD;  Location: [facility] CATH LAB;  Service: Cardiovascular;  Laterality: N/A;  pending creatnine  . LEFT HEART CATHETERIZATION WITH CORONARY/GRAFT ANGIOGRAM N/A 06/30/2011   Procedure: LEFT HEART CATHETERIZATION WITH Isabel Caprice;  Surgeon: Thurmon Fair, MD;  Location: [facility] CATH LAB;  Service: Cardiovascular;  Laterality: N/A;  . LIGATION OF ARTERIOVENOUS  FISTULA Left 06/08/2012   Procedure: LIGATION OF ARTERIOVENOUS  FISTULA;  Surgeon: Fransisco Hertz, MD;  Location: [facility] OR;  Service: Vascular;  Laterality: Left;  . NM MYOCAR PERF EJECTION FRACTION  06/25/2011   There is evidence of mild ischemia in the basal inferolateral and mid inferolateral regions. (Extent 7 %) The post-stress ejection fraction is 44%. There is mild hypocontractility in the distal inferoapical segment. baseline T wave inversion is noted in leads I, avL,  II, V2. This is a low risk scan. The present perfusion study suggests significant myocardial salvage with mild residual ischemia .  Marland Kitchen PERCUTANEOUS CORONARY INTERVENTION-BALLOON ONLY  06/30/2011   Procedure: PERCUTANEOUS CORONARY INTERVENTION-BALLOON ONLY;  Surgeon: Thurmon Fair, MD;  Location: [facility] CATH LAB;  Service: Cardiovascular;;  . PERCUTANEOUS CORONARY STENT INTERVENTION (PCI-S) N/A 07/04/2011   Procedure: PERCUTANEOUS CORONARY STENT INTERVENTION (PCI-S);  Surgeon: Lennette Bihari, MD;  Location: [facility] CATH LAB;  Service: Cardiovascular;  Laterality: N/A;  . REVISION OF ARTERIOVENOUS GORETEX GRAFT Left 12/06/2014   Procedure: REVISION OF ARTERIOVENOUS GORETEX GRAFT USING 66mmx10cm;  Surgeon: Fransisco Hertz, MD;  Location: [facility] OR;  Service: Vascular;  Laterality: Left;  . STOMACH SURGERY  2009   achalasia  . SUBCLAVIAN STENT PLACEMENT  12/20/2011  . TONSILLECTOMY    . TONSILLECTOMY         Home Medications    Prior to Admission medications   Medication Sig Start Date End Date Taking? Authorizing Provider  amLODipine (NORVASC) 5 MG tablet TAKE ONE TABLET BY MOUTH ONE TIME DAILY  12/08/16   Runell Gess, MD  aspirin EC 81 MG EC tablet Take 1 tablet (81 mg total) by mouth daily. 07/07/11   Dwana Melena, PA-C  atorvastatin (LIPITOR) 40 MG tablet Take 1 tablet (40 mg total) by mouth daily. 11/04/16 02/02/17  Runell Gess, MD  cinacalcet (SENSIPAR) 90 MG tablet Take 90 mg by mouth at bedtime.    [provider]  cloNIDine (CATAPRES) 0.2 MG tablet TAKE ONE TABLET BY MOUTH TWICE DAILY  12/08/16   Runell Gess, MD  clopidogrel (PLAVIX) 75 MG tablet Take 1 tablet (75 mg total) by mouth daily. 10/14/16   Levert Feinstein, MD  febuxostat (ULORIC) 40 MG tablet Take 40 mg by mouth daily as needed (gout).  05/13/11   Corwin Levins, MD  ketoconazole (NIZORAL) 2 % cream Apply 1 application topically daily. 12/18/16   Vivi Barrack, DPM  lanthanum ([name]) 1000 MG chewable tablet Chew 2  tablets (2,000 mg total) by mouth 3 (three) times daily with meals. Yearly physical due in July must see MD for refills 09/10/15   Corwin Levins, MD  lidocaine-prilocaine (EMLA) cream Apply 1 application topically as needed. Apply as needed for dialysis site    [provider]  memantine (NAMENDA) 10 MG tablet Take 1 tablet (10 mg total) by mouth 2 (two) times daily. 03/20/16   Levert Feinstein, MD  metoprolol (LOPRESSOR) 50 MG tablet Take 1 tablet (50  mg total) by mouth 2 (two) times daily. 09/13/14   Corwin Levins, MD  NITROSTAT 0.4 MG SL tablet PLACE 1 TABLET UNDER TONGUE AS NEEDED FOR CHEST PAIN EVERY 5 MINUTES (MAX 3 DOSES) 10/02/15   Runell Gess, MD  terazosin (HYTRIN) 10 MG capsule TAKE 1 CAPSULE BY MOUTH DAILY AT BEDTIME 11/11/16   Corwin Levins, MD  urea (GORDONS UREA) 40 % ointment Apply topically as needed. 12/18/16   Vivi Barrack, DPM  zolpidem (AMBIEN) 10 MG tablet TAKE ONE TABLET BY MOUTH AT BEDTIME AS NEEDED FOR SLEEP 11/12/16   Corwin Levins, MD    Family History Family History  Problem Relation Age of Onset  . Cancer Mother   . Heart disease Mother   . Hyperlipidemia Mother   . Hypertension Mother   . Alzheimer's disease Mother   . Cancer Father   . Heart disease Father   . Hypertension Other   . Stroke Other   . Hyperlipidemia Other     Social History Social History   Tobacco Use  . Smoking status: Never Smoker  . Smokeless tobacco: Former Neurosurgeon    Types: Chew  . Tobacco comment: chewed tobacco   Substance Use Topics  . Alcohol use: Yes    Alcohol/week: 1.2 oz    Types: 2 Shots of liquor per week    Comment: rarely; beer  . Drug use: No     Allergies   Shrimp [shellfish allergy]; Atorvastatin; Insulins; Simvastatin; and Ultram [tramadol]   Review of Systems Review of Systems  All other systems reviewed and are negative.    Physical Exam Updated Vital Signs BP (!) 166/92 (BP Location: Right Arm)   Pulse (!) 106   Temp 98.5 F (36.9 C)  (Oral)   Resp 19   Wt 102.5 kg (226 lb)   SpO2 99%   BMI 30.65 kg/m   Physical Exam  Constitutional: He appears well-developed and well-nourished.  HENT:  Head: Normocephalic and atraumatic.  Right Ear: External ear normal.  Left Ear: External ear normal.  Nose: Nose normal.  Mouth/Throat: Uvula is midline, oropharynx is clear and moist and mucous membranes are normal. No tonsillar exudate.  Eyes: Pupils are equal, round, and reactive to light. Right eye exhibits no discharge. Left eye exhibits no discharge. No scleral icterus.  Neck: Trachea normal. Neck supple. No JVD present. No spinous process tenderness present. Carotid bruit is not present. No neck rigidity. Normal range of motion present.  Cardiovascular: Regular rhythm and intact distal pulses. Tachycardia present.  No murmur heard. Pulses:      Radial pulses are 2+ on the right side, and 2+ on the left side.       Dorsalis pedis pulses are 2+ on the right side, and 2+ on the left side.       Posterior tibial pulses are 2+ on the right side, and 2+ on the left side.  No lower extremity swelling or edema. Calves symmetric in size bilaterally. Good thrill of AV fistula.   Pulmonary/Chest: Effort normal. No accessory muscle usage. No respiratory distress. He has decreased breath sounds in the right lower field and the left lower field. He has rales in the right lower field and the left lower field. He exhibits no tenderness.  Prior CABG scar, healed.   Abdominal: Soft. Bowel sounds are normal. He exhibits distension. He exhibits no pulsatile midline mass. There is no tenderness. There is no rigidity, no rebound, no guarding and no  CVA tenderness.  Musculoskeletal: He exhibits no edema.  Lymphadenopathy:    He has no cervical adenopathy.  Neurological: He is alert.  Skin: Skin is warm and dry. No rash noted. He is not diaphoretic.  Psychiatric: He has a normal mood and affect.  Nursing note and vitals reviewed.    ED  Treatments / Results  Labs (all labs ordered are listed, but only abnormal results are displayed) Labs Reviewed  BASIC METABOLIC PANEL - Abnormal; Notable for the following components:      Result Value   Sodium 132 (*)    Chloride 93 (*)    BUN 37 (*)    Creatinine, Ser 14.24 (*)    Calcium 6.8 (*)    GFR calc non Af Amer 3 (*)    GFR calc Af Amer 4 (*)    Anion gap 17 (*)    All other components within normal limits  CBC - Abnormal; Notable for the following components:   RBC 2.47 (*)    Hemoglobin 8.0 (*)    HCT 24.0 (*)    Platelets 83 (*)    All other components within normal limits  BRAIN NATRIURETIC PEPTIDE - Abnormal; Notable for the following components:   B Natriuretic Peptide 960.1 (*)    All other components within normal limits  I-STAT TROPONIN, ED - Abnormal; Notable for the following components:   Troponin i, poc 0.41 (*)    All other components within normal limits  HIV ANTIBODY (ROUTINE TESTING)  TROPONIN I  TROPONIN I  TROPONIN I  PARATHYROID HORMONE, INTACT (NO CA)  PHOSPHORUS  VITAMIN D 25 HYDROXY (VIT D DEFICIENCY, FRACTURES)  OCCULT BLOOD X 1 CARD TO LAB, STOOL  CALCIUM, IONIZED  I-STAT TROPONIN, ED    EKG  EKG Interpretation  Date/Time:  Wednesday March 04 2017 08:14:57 EST Ventricular Rate:  104 PR Interval:  160 QRS Duration: 72 QT Interval:  408 QTC Calculation: 536 R Axis:   -21 Text Interpretation:  Sinus tachycardia with occasional Premature ventricular complexes Possible Inferior infarct , age undetermined Anterolateral infarct , age undetermined Prolonged QT Abnormal ECG no sig change from previous Confirmed by Arby BarrettePfeiffer, Marcy [id number]) on 03/04/2017 9:10:58 AM       Radiology Dg Chest 2 View  Result Date: 03/04/2017 CLINICAL DATA:  Onset of mid sternal chest pain after receiving 1 hour hemodialysis. EXAM: CHEST  2 VIEW COMPARISON:  PA and lateral chest x-ray dated December 29, 2013 FINDINGS: The lungs are adequately inflated.  There is no focal infiltrate. The interstitial markings are mildly increased. The cardiac silhouette is enlarged. The central pulmonary vascularity is prominent and less distinct than on the previous study. The patient has undergone previous CABG. The bony thorax exhibits no acute abnormality. IMPRESSION: Findings are consistent with low-grade interstitial edema. Stable cardiomegaly. No alveolar pneumonia. Electronically Signed   By: David  Swazil[place]Jordan M.D.   On: 03/04/2017 09:27    Procedures Procedures (including critical care time)  Medications Ordered in ED Medications  clopidogrel (PLAVIX) tablet 75 mg (not administered)  lanthanum ([name]) chewable tablet 2,000 mg (not administered)  terazosin (HYTRIN) capsule 10 mg (not administered)  memantine (NAMENDA) tablet 10 mg (not administered)  0.9 %  sodium chloride infusion (not administered)  acetaminophen (TYLENOL) tablet 650 mg (not administered)  ondansetron (ZOFRAN) injection 4 mg (not administered)  ALPRAZolam (XANAX) tablet 0.25 mg (not administered)  heparin injection 5,000 Units (not administered)  doxercalciferol (HECTOROL) injection 3 mcg (not administered)  Darbepoetin Alfa (ARANESP)  injection 100 mcg (not administered)  nitroGLYCERIN (NITROGLYN) 2 % ointment 0.5 inch (0.5 inches Topical Given 03/04/17 1145)  morphine 4 MG/ML injection 4 mg (4 mg Intravenous Given 03/04/17 1146)  metoprolol tartrate (LOPRESSOR) injection 5 mg (5 mg Intravenous Given 03/04/17 1144)     Initial Impression / Assessment and Plan / ED Course  I have reviewed the triage vital signs and the nursing notes.  Pertinent labs & imaging results that were available during my care of the patient were reviewed by me and considered in my medical decision making (see chart for details).     56 year old male with history of CAD, CVA, CHF, CKD on hemodialysis (M/W/F followed by Dr. Elvis Coil), hyperlipidemia and hypertension presents the ED today for chest  pain that began at 730am this morning.  1 hour into hemodialysis when he started becoming short of breath, nauseated and had central chest pain without radiation.  He describes this as being similar to his previous heart attack.  He received 324 mg of aspirin and 2 sublingual nitroglycerin in route by EMS.  He still currently at a 6/10 pain level. Patient is followed by Dr[facility]al of [facility]. He was last seen by cardiology on 11/04/16. Last 2D echo 12/31/2013 shows EF 65-70%.  Last heart catheterization on 06/30/2011 by Dr. Royann Shivers followed by PCI.  Patient did undergo 4 vessel CABG on 04/01/2011 by Dr. Donata Clay (LIMA to LAD, SVG to PLA branch of the RCA, sequential SVG to OM1 and OM2).  On exam the patient does not appear to be significantly fluid overloaded.  He does not have JVD or lower extremity swelling.  He does have some rales of the lower lung fields with decreased breath sounds.  Patient's ECG without significant change from prior.  Troponin noted to be elevated at 0.41.  With other labs pending it appears that the patient's symptoms are consistent with a NSTEMI as the patient has history of significant coronary disease and feels this is similar to pain he had when he had MI in the past. Will consult cardiology and review labs as they result.   Cardiology to consult on the patient.  Creatinine 14.24 with elevated BUN at 37. BNP 960.1. Hgb 8.0. Consideration of the NSTEMI vs demand ischemia versus renal failure causing elevated troponin. Patient also noted to have anion gap of 17, likely due to renal failure. Bicarb wnl. Glucose not suggestive of DKA. Low suspicion for other causes of anion gap acidosis at this time.   Cardiology recommends admitting to hospital medicine service and they will follow with an ischemic evaluation. He is currently chest pain free.   PA with Dr. Dartha Lodge to admit the patient. Requested consult to nephrology so patient can be placed on list for dialysis. They agreed to add  the patient onto dialysis list. Patient appears safe for admission.   Final Clinical Impressions(s) / ED Diagnoses   Final diagnoses:  Other chest pain  Stage 5 chronic kidney disease on chronic dialysis [facility])  Elevated troponin    ED Discharge Orders    None       Norma, [place] 03/04/17 1632    Arby Barrette, MD 03/07/17 0010

## 2017-03-04 NOTE — ED Notes (Signed)
Iv team at bedside  

## 2017-03-04 NOTE — ED Notes (Signed)
PT to do nuclear stress test tomm and cath if need be.

## 2017-03-04 NOTE — Progress Notes (Signed)
Patient arrived to unit, no chest pain. Vital signs stable. QTc is prolonged ranging 600-700. Paged MD Ogbata so he can be aware.

## 2017-03-04 NOTE — ED Notes (Signed)
No needs; denies pain

## 2017-03-04 NOTE — Progress Notes (Addendum)
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 Shrimp [Shellfish Allergy] Shortness Of Breath  . Atorvastatin Other (See Comments)    weakness  . Insulins Other (See Comments)    Patient unsure as to the kind of insulin but states that it has previously caused seizures.  Most recent hospitalization (Jan 2013) received insulin but did not have reaction. LIKELY DUE TO SIGNIFICANT HYPOGLYCEMIA  . Simvastatin Other (See Comments)     abnormal liver tests  . Ultram [Tramadol] Other (See Comments)    Liver enzyme     Patient Measurements: Height: 6' (182.9 cm) Weight: 225 lb 1.6 oz (102.1 kg) IBW/kg (Calculated) : 77.6 Argatroban Dosing Weight: 102.1 kg  Vital Signs: Temp: 98.2 F (36.8 C) (12/19 1616) Temp Source: Oral (12/19 1616) BP: 105/77 (12/19 1616) Pulse Rate: 83 (12/19 1616)  Labs: Recent Labs    03/04/17 0958  HGB 8.0*  HCT 24.0*  PLT 83*  CREATININE 14.24*    Estimated Creatinine Clearance: 7.2 mL/min (A) (by C-G formula based on SCr of 14.24 mg/dL (H)).   Medical History: Past Medical History:  Diagnosis Date  . ACHALASIA   . ANEMIA-NOS   . CAD (coronary artery disease), 3 vessel significant disease.    . CEREBROVASCULAR ACCIDENT, HX OF   . CHF (congestive heart failure) (HCC)   . CKD (chronic kidney disease) stage 4, GFR 15-29 ml/min (HCC)   . Constipation   . DEGENERATIVE JOINT DISEASE, SHOULDER    knee  . DEPRESSION    2006- not depressed any longer  . Family history of adverse reaction to anesthesia    Mother, hard to awaken  . GERD    no meds required  . Gout    takes Uloric daily  . H/O hiatal hernia   . Headache(784.0)   . Heart attack (HCC)   . History of colon polyps   . History of diastolic dysfunction, grade 2 by echo   . HYPERLIPIDEMIA    takes crestor daily  . Hyperlipidemia   . HYPERTENSION    takes  Norvasc,Catapress,Hydralazine,Imdur,and Metoprolol daily  . Impaired glucose tolerance   . Insomnia    takes Ambien nightly  . NSTEMI (non-ST elevated myocardial infarction), 06/30/11 07/01/2011  . RENAL CALCULUS, HX OF   . RENAL INSUFFICIENCY    12/2013- TThSat  . S/P coronary artery stent placement, PTCA/DES Resolute to mid-LAD via the LIMA graft, and PTCA of apical 95% stenosis 07/05/11 07/04/2011  . S/P PTCA (percutaneous transluminal coronary angioplasty), 06/30/11 07/01/2011  . Seizures (HCC)    INSULIN INDUCED  . Sleep apnea    [place] NO MACHINE YET  1 YEAR  . Stroke (HCC)    93/2005/2006/2007;left sided weakness.  1993 12/2013    Medications:  Medications Prior to Admission  Medication Sig Dispense Refill Last Dose  . amLODipine (NORVASC) 5 MG tablet TAKE ONE TABLET BY MOUTH ONE TIME DAILY  30 tablet 11 03/03/2017 at Unknown time  . aspirin EC 81 MG EC tablet Take 1 tablet (81 mg total) by mouth daily.   03/03/2017 at Unknown time  . cinacalcet (SENSIPAR) 90 MG tablet Take 90 mg by mouth at bedtime.   03/03/2017 at Unknown time  . cloNIDine (CATAPRES) 0.2 MG tablet TAKE ONE TABLET BY MOUTH TWICE DAILY  30 tablet 11 03/03/2017 at Unknown time  . clopidogrel (PLAVIX) 75 MG tablet Take 1 tablet (75 mg total)  by mouth daily. 90 tablet 4 03/03/2017 at Unknown time  . ketoconazole (NIZORAL) 2 % cream Apply 1 application topically daily. 60 g 2 03/03/2017 at Unknown time  . lanthanum ([name]) 1000 MG chewable tablet Chew 2 tablets (2,000 mg total) by mouth 3 (three) times daily with meals. Yearly physical due in July must see MD for refills 540 tablet 0 03/03/2017 at Unknown time  . lidocaine-prilocaine (EMLA) cream Apply 1 application topically as needed. Apply as needed for dialysis site   03/02/2017  . memantine (NAMENDA) 10 MG tablet Take 1 tablet (10 mg total) by mouth 2 (two) times daily. 60 tablet 11 03/03/2017 at Unknown time  . metoprolol (LOPRESSOR) 50 MG tablet  Take 1 tablet (50 mg total) by mouth 2 (two) times daily. 180 tablet 3 03/03/2017 at 2400  . NITROSTAT 0.4 MG SL tablet PLACE 1 TABLET UNDER TONGUE AS NEEDED FOR CHEST PAIN EVERY 5 MINUTES (MAX 3 DOSES) 25 tablet 4 03/04/2017 at prn  . terazosin (HYTRIN) 10 MG capsule TAKE 1 CAPSULE BY MOUTH DAILY AT BEDTIME 90 capsule 2 03/03/2017 at Unknown time  . atorvastatin (LIPITOR) 40 MG tablet Take 1 tablet (40 mg total) by mouth daily. 90 tablet 3   . febuxostat (ULORIC) 40 MG tablet Take 40 mg by mouth daily as needed (gout).    60 days ago at prn  . urea (GORDONS UREA) 40 % ointment Apply topically as needed. (Patient not taking: Reported on 03/04/2017) 30 g 0 Not Taking at Unknown time  . zolpidem (AMBIEN) 10 MG tablet TAKE ONE TABLET BY MOUTH AT BEDTIME AS NEEDED FOR SLEEP (Patient not taking: Reported on 03/04/2017) 90 tablet 1 Not Taking at Unknown time    Assessment: 71 YOM with a history of ESRD on M/W/F HD presented to the ED with significant chest pain. His platelet counts have dropped significantly down to 83k from a baseline of 297K on 10/24. Pharmacy consulted to start IV argatroban for concern for HIT. HIT panel has been sent out. H/H low at 8/24. BL apTT 35  Goal of Therapy:  aPTT 45-90 seconds  Monitor platelets by anticoagulation protocol: Yes   Plan:  -Given h/o of CKD, will start at lower dose of argatroban 0.5 mcg/kg/min -F/u Q 2 hr aPTT until aPTT's at goal. Increase infusion rate in increments of 20% until at goal range  -Monitor closely for s/s of bleeding.   Vinnie Level, PharmD., BCPS Clinical Pharmacist Pager [phone]  Addendum: Initial aPTT is on lower end of goal range at 45. Will increase argatroban to 0.6 mcg/kg/min and f/u aptt in 4 hours. If aPTT remains therapeutic, can consider checking q12h aPTTs.   Vinnie Level, PharmD., BCPS Clinical Pharmacist Pager [phone]

## 2017-03-04 NOTE — ED Notes (Signed)
Heart healthy renal lunch tray ordered at 1235

## 2017-03-04 NOTE — ED Provider Notes (Signed)
Medical screening examination/treatment/procedure(s) were conducted as a shared visit with non-physician practitioner(s) and myself.  I personally evaluated the patient during the encounter.   EKG Interpretation  Date/Time:  Wednesday March 04 2017 08:14:57 EST Ventricular Rate:  104 PR Interval:  160 QRS Duration: 72 QT Interval:  408 QTC Calculation: 536 R Axis:   -21 Text Interpretation:  Sinus tachycardia with occasional Premature ventricular complexes Possible Inferior infarct , age undetermined Anterolateral infarct , age undetermined Prolonged QT Abnormal ECG no sig change from previous Confirmed by Arby Barrette [phone]) on 03/04/2017 9:10:58 AM     Has significant medical history of severe comorbid illness.  He reports in dialysis on Monday he did develop chest pain but by the time he got home it had resolved.  He reports he was not having symptoms until he was undergoing dialysis today again.  He reports diffuse central chest pain and shortness of breath.  Persisted even after dialysis I completed.  Patient does report it feels similar to prior MI.  I see the patient after he has been treated with nitroglycerin and morphine.  Patient reported the pain is now "much better".  He still however had a difficult time quantifying the amount of change and if the pain was completely resolved.  Patient is alert and appropriate.  Deconditioned and central obesity.  Heart is mildly tachycardic with distant heart sounds.  Pulmonary auscultations for bibasilar rales.  Abdomen is soft and nontender.  Lower extremities appear to have some chronic 1-2+ pitting edema.  Skin thinning consistent with chronic venous stasis.  Physician extender has contacted cardiology for consultation.  Patient's troponin is elevated at 0.41.  Concern is for N STEMI.  Differential diagnosis also includes demand ischemia and renal failure.   Arby Barrette, MD 03/04/17 1049

## 2017-03-04 NOTE — H&P (Signed)
History and Physical    [name] ZOX:[id number] DOB: 25-Sep-1960 DOA: 03/04/2017   PCP: Corwin Levins, MD   Patient coming from:  Home    Chief Complaint: Chest pain and Shortness of breath   HPI: [name] is a 56 y.o. male with medical history significant for CAD status post CABG in 2013,ESRD on hemodialysis Monday Wednesday and Friday followed by Dr. Hyman Hopes, missing hemodialysis last Monday, hyperlipidemia, hypertension, presenting to the emergency department with significant chest pain. The patient reported that after having 1 hour of hemodialysis, he became short of breath, is well as spirits seem sharp, midsternal chest pain without radiation, associated with nausea. He denies any diaphoresis. The patient reports these symptoms being similar to those of his prior MIs. He was placed on oxygen, improving his shortness of breath. On transport, he received 324 mg of aspirin, and 2 sublingual nitroglycerin, without significant relief. On presentation, his pain was 6 out of 10. He denies any positional or pleuritic chest pain. He denies any lower extremity swelling pain.    ED Course:  BP 105/77 (BP Location: Right Arm)   Pulse 83   Temp 98.2 F (36.8 C) (Oral)   Resp 18   Ht 6' (1.829 m)   Wt 102.1 kg (225 lb 1.6 oz)   SpO2 100%   BMI 30.53 kg/m    Sinus tachycardia with occasional Premature ventricular complexes Possible Inferior infarct , age undetermined Anterolateral infarct , age undetermined Prolonged QT Abnormal ECG no sig change from previous On presentation, his troponin was 0.41, with EKG not showing significant changes. However, due to his findings, cardiology evaluation was requested "Last 2D echo 12/31/2013 shows EF 65-70%.  Last heart catheterization on 06/30/2011 by Dr. Royann Shivers followed by PCI.  Patient did undergo 4 vessel CABG on 04/01/2011 by Dr. Donata Clay (LIMA to LAD, SVG to PLA branch of the RCA, sequential SVG to OM1 and OM2)".  Dr. Royann Shivers , cardiology,  has seen and evaluated the patient, and recommended ischemic evaluation with Myoview in a.m., versus repeating heart catheterization. Appreciate their involvement. In addition, his creatinine was 14.4, with bun at 37, and due to incomplete dialysis today, nephrology consultation was requested as well. Dr. Hyman Hopes, nephrology, consulted on this patient, and recommended close monitoring, but at this time no additional treatment is needed. Appreciate their follow-up.  Review of Systems:  As per HPI otherwise all other systems reviewed and are negative  Past Medical History:  Diagnosis Date  . ACHALASIA   . ANEMIA-NOS   . CAD (coronary artery disease), 3 vessel significant disease.    . CEREBROVASCULAR ACCIDENT, HX OF   . CHF (congestive heart failure) (HCC)   . CKD (chronic kidney disease) stage 4, GFR 15-29 ml/min (HCC)   . Constipation   . DEGENERATIVE JOINT DISEASE, SHOULDER    knee  . DEPRESSION    2006- not depressed any longer  . Family history of adverse reaction to anesthesia    Mother, hard to awaken  . GERD    no meds required  . Gout    takes Uloric daily  . H/O hiatal hernia   . Headache(784.0)   . Heart attack (HCC)   . History of colon polyps   . History of diastolic dysfunction, grade 2 by echo   . HYPERLIPIDEMIA    takes crestor daily  . Hyperlipidemia   . HYPERTENSION    takes Norvasc,Catapress,Hydralazine,Imdur,and Metoprolol daily  . Impaired glucose tolerance   . Insomnia  takes Ambien nightly  . NSTEMI (non-ST elevated myocardial infarction), 06/30/11 07/01/2011  . RENAL CALCULUS, HX OF   . RENAL INSUFFICIENCY    12/2013- TThSat  . S/P coronary artery stent placement, PTCA/DES Resolute to mid-LAD via the LIMA graft, and PTCA of apical 95% stenosis 07/05/11 07/04/2011  . S/P PTCA (percutaneous transluminal coronary angioplasty), 06/30/11 07/01/2011  . Seizures (HCC)    INSULIN INDUCED  . Sleep apnea    [place] , NO MACHINE YET  1 YEAR  .  Stroke (HCC)    93/2005/2006/2007;left sided weakness.  1993 12/2013    Past Surgical History:  Procedure Laterality Date  . ARCH AORTOGRAM N/A 02/19/2012   Procedure: ARCH AORTOGRAM;  Surgeon: Fransisco HertzBrian L Chen, MD;  [facility];  Service: Cardiovascular;  Laterality: N/A;  . AV FISTULA PLACEMENT  12/02/2011   Procedure: ARTERIOVENOUS (AV) FISTULA CREATION;  Surgeon: Fransisco HertzBrian L Chen, MD;  [facility];  Service: Vascular;  Laterality: Left;  Ultrasound Guided  . AV FISTULA PLACEMENT Left 06/08/2012   Procedure: ARTERIOVENOUS (AV) FISTULA CREATION;  Surgeon: Fransisco HertzBrian L Chen, MD;  [facility];  Service: Vascular;  Laterality: Left;  . BASCILIC VEIN TRANSPOSITION Left 09/06/2014   Procedure: FIRST STAGE BASILIC VEIN TRANSPOSITION ;  Surgeon: Fransisco HertzBrian L Chen, MD;  [facility];  Service: Vascular;  Laterality: Left;  . BASCILIC VEIN TRANSPOSITION Left 12/06/2014   Procedure: SECOND STAGE BRACHIAL VEIN TRANSPOSITION ;  Surgeon: Fransisco HertzBrian L Chen, MD;  [facility];  Service: Vascular;  Laterality: Left;  . CARDIAC CATHETERIZATION    . COLONOSCOPY    . [facility]     [facility]  . CORONARY ARTERY BYPASS GRAFT  04/01/2011   Procedure: CORONARY ARTERY BYPASS GRAFTING (CABG);  Surgeon: Kathlee NationsPeter Van Suann Larryrigt III, MD;  [facility];  Service: Open Heart Surgery;  Laterality: N/A;  . HERNIA REPAIR  2013  . INSERTION OF DIALYSIS CATHETER  04/01/2011   Procedure: INSERTION OF DIALYSIS CATHETER;  Surgeon: Nilda SimmerBrian Liang-Yu Chen, MD;  [facility];  Service: Vascular;  Laterality: N/A;  . KNEE SURGERY Right 01/07/2011   arthroscopy  . LEFT HEART CATHETERIZATION WITH CORONARY ANGIOGRAM N/A 03/28/2011   Procedure: LEFT HEART CATHETERIZATION WITH CORONARY ANGIOGRAM;  Surgeon: Lennette Biharihomas A Kelly, MD;  [facility];  Service: Cardiovascular;  Laterality: N/A;  pending creatnine  . LEFT HEART CATHETERIZATION WITH CORONARY/GRAFT ANGIOGRAM N/A 06/30/2011   Procedure: LEFT HEART CATHETERIZATION  WITH Isabel CapriceORONARY/GRAFT ANGIOGRAM;  Surgeon: Thurmon FairMihai Croitoru, MD;  Location: [facility];  Service: Cardiovascular;  Laterality: N/A;  . LIGATION OF ARTERIOVENOUS  FISTULA Left 06/08/2012   Procedure: LIGATION OF ARTERIOVENOUS  FISTULA;  Surgeon: Fransisco HertzBrian L Chen, MD;  [facility];  Service: Vascular;  Laterality: Left;  . NM MYOCAR PERF EJECTION FRACTION  06/25/2011   There is evidence of mild ischemia in the basal inferolateral and mid inferolateral regions. (Extent 7 %) The post-stress ejection fraction is 44%. There is mild hypocontractility in the distal inferoapical segment. baseline T wave inversion is noted in leads I, avL, II, V2. This is a low risk scan. The present perfusion study suggests significant myocardial salvage with mild residual ischemia .  Marland Kitchen. PERCUTANEOUS CORONARY INTERVENTION-BALLOON ONLY  06/30/2011   Procedure: PERCUTANEOUS CORONARY INTERVENTION-BALLOON ONLY;  Surgeon: Thurmon FairMihai Croitoru, MD;  Location: [facility];  Service: Cardiovascular;;  . PERCUTANEOUS CORONARY STENT INTERVENTION (PCI-S) N/A 07/04/2011   Procedure: PERCUTANEOUS CORONARY STENT INTERVENTION (PCI-S);  Surgeon: Lennette Bihari, MD;  Location: [facility] CATH LAB;  Service: Cardiovascular;  Laterality: N/A;  . REVISION OF ARTERIOVENOUS GORETEX GRAFT Left 12/06/2014   Procedure: REVISION OF ARTERIOVENOUS GORETEX GRAFT USING 50mmx10cm;  Surgeon: Fransisco Hertz, MD;  [facility];  Service: Vascular;  Laterality: Left;  . STOMACH SURGERY  2009   achalasia  . SUBCLAVIAN STENT PLACEMENT  12/20/2011  . TONSILLECTOMY    . TONSILLECTOMY      Social History Social History   Socioeconomic History  . Marital status: Divorced    Spouse name: Not on file  . Number of children: 1  . Years of education: Bachelors  . Highest education level: Not on file  Social Needs  . Financial resource strain: Not on file  . Food insecurity - worry: Not on file  . Food insecurity - inability: Not on file  . Transportation needs - medical: Not on  file  . Transportation needs - non-medical: Not on file  Occupational History  . Occupation: Disabled  Tobacco Use  . Smoking status: Never Smoker  . Smokeless tobacco: Former Neurosurgeon    Types: Chew  . Tobacco comment: chewed tobacco   Substance and Sexual Activity  . Alcohol use: Yes    Alcohol/week: 1.2 oz    Types: 2 Shots of liquor per week    Comment: rarely; beer  . Drug use: No  . Sexual activity: Not Currently    Partners: Male  Other Topics Concern  . Not on file  Social History Narrative   Lives at home alone.   Right-handed.   Occasional caffeine use.     Allergies  Allergen Reactions  . Shrimp [Shellfish Allergy] Shortness Of Breath  . Atorvastatin Other (See Comments)    weakness  . Insulins Other (See Comments)    Patient unsure as to the kind of insulin but states that it has previously caused seizures.  Most recent hospitalization (Jan 2013) received insulin but did not have reaction. LIKELY DUE TO SIGNIFICANT HYPOGLYCEMIA  . Simvastatin Other (See Comments)     abnormal liver tests  . Ultram [Tramadol] Other (See Comments)    Liver enzyme     Family History  Problem Relation Age of Onset  . Cancer Mother   . Heart disease Mother   . Hyperlipidemia Mother   . Hypertension Mother   . Alzheimer's disease Mother   . Cancer Father   . Heart disease Father   . Hypertension Other   . Stroke Other   . Hyperlipidemia Other       Prior to Admission medications   Medication Sig Start Date End Date Taking? Authorizing Provider  amLODipine (NORVASC) 5 MG tablet TAKE ONE TABLET BY MOUTH ONE TIME DAILY  12/08/16  Yes Runell Gess, MD  aspirin EC 81 MG EC tablet Take 1 tablet (81 mg total) by mouth daily. 07/07/11  Yes Dwana Melena, PA-C  cinacalcet (SENSIPAR) 90 MG tablet Take 90 mg by mouth at bedtime.   Yes [provider]  cloNIDine (CATAPRES) 0.2 MG tablet TAKE ONE TABLET BY MOUTH TWICE DAILY  12/08/16  Yes Runell Gess, MD    clopidogrel (PLAVIX) 75 MG tablet Take 1 tablet (75 mg total) by mouth daily. 10/14/16  Yes Levert Feinstein, MD  ketoconazole (NIZORAL) 2 % cream Apply 1 application topically daily. 12/18/16  Yes Vivi Barrack, DPM  lanthanum ([name]) 1000 MG chewable tablet Chew 2 tablets (2,000 mg total) by mouth 3 (three)  times daily with meals. Yearly physical due in July must see MD for refills 09/10/15  Yes Corwin Levins, MD  lidocaine-prilocaine (EMLA) cream Apply 1 application topically as needed. Apply as needed for dialysis site   Yes [provider]  memantine (NAMENDA) 10 MG tablet Take 1 tablet (10 mg total) by mouth 2 (two) times daily. 03/20/16  Yes Levert Feinstein, MD  metoprolol (LOPRESSOR) 50 MG tablet Take 1 tablet (50 mg total) by mouth 2 (two) times daily. 09/13/14  Yes Corwin Levins, MD  NITROSTAT 0.4 MG SL tablet PLACE 1 TABLET UNDER TONGUE AS NEEDED FOR CHEST PAIN EVERY 5 MINUTES (MAX 3 DOSES) 10/02/15  Yes Runell Gess, MD  terazosin (HYTRIN) 10 MG capsule TAKE 1 CAPSULE BY MOUTH DAILY AT BEDTIME 11/11/16  Yes Corwin Levins, MD  atorvastatin (LIPITOR) 40 MG tablet Take 1 tablet (40 mg total) by mouth daily. 11/04/16 02/02/17  Runell Gess, MD  febuxostat (ULORIC) 40 MG tablet Take 40 mg by mouth daily as needed (gout).  05/13/11   Corwin Levins, MD  urea (GORDONS UREA) 40 % ointment Apply topically as needed. Patient not taking: Reported on 03/04/2017 12/18/16   Vivi Barrack, DPM  zolpidem (AMBIEN) 10 MG tablet TAKE ONE TABLET BY MOUTH AT BEDTIME AS NEEDED FOR SLEEP Patient not taking: Reported on 03/04/2017 11/12/16   Corwin Levins, MD    Physical Exam:  Vitals:   03/04/17 1330 03/04/17 1430 03/04/17 1500 03/04/17 1616  BP: (!) 145/90 134/87 120/78 105/77  Pulse: 94 88 86 83  Resp: (!) 22 16 19 18   Temp:    98.2 F (36.8 C)  TempSrc:    Oral  SpO2: 93% 92% 96% 100%  Weight:    102.1 kg (225 lb 1.6 oz)  Height:    6' (1.829 m)   Constitutional: NAD, calm,  chronically ill appearing Eyes: PERRL, lids and conjunctivae normal ENMT: Mucous membranes are moist, without exudate or lesions  Neck: normal, supple, no masses, no thyromegaly Respiratory: clear to auscultation bilaterally, no wheezing, no crackles. Normal respiratory effort  Cardiovascular: Regular rate and rhythm, 2/6 systolic murmur, rubs or gallops. No lower extremity edema. Left upper extremity, appears somewhat edematous, and the left axis is nontender. No erythema is noted in the area. 2+ pedal pulses. No carotid bruits.  Abdomen: Soft, non tender, No hepatosplenomegaly. Bowel sounds positive.  Musculoskeletal: no clubbing / cyanosis. Moves all extremities Skin: no jaundice, No lesions.  Neurologic: Sensation intact  Strength equal in all extremities Psychiatric:   Alert and oriented x 3. Normal mood.     Labs on Admission: I have personally reviewed following labs and imaging studies  CBC: Recent Labs  Lab 03/04/17 0958  WBC 7.9  HGB 8.0*  HCT 24.0*  MCV 97.2  PLT 83*    Basic Metabolic Panel: Recent Labs  Lab 03/04/17 0958  NA 132*  K 4.3  CL 93*  CO2 22  GLUCOSE 78  BUN 37*  CREATININE 14.24*  CALCIUM 6.8*    GFR: Estimated Creatinine Clearance: 7.2 mL/min (A) (by C-G formula based on SCr of 14.24 mg/dL (H)).  Liver Function Tests: No results for input(s): AST, ALT, ALKPHOS, BILITOT, PROT, ALBUMIN in the last 168 hours. No results for input(s): LIPASE, AMYLASE in the last 168 hours. No results for input(s): AMMONIA in the last 168 hours.  Coagulation Profile: No results for input(s): INR, PROTIME in the last 168 hours.  Cardiac Enzymes: No  results for input(s): CKTOTAL, CKMB, CKMBINDEX, TROPONINI in the last 168 hours.  BNP (last 3 results) No results for input(s): PROBNP in the last 8760 hours.  HbA1C: No results for input(s): HGBA1C in the last 72 hours.  CBG: No results for input(s): GLUCAP in the last 168 hours.  Lipid Profile: No  results for input(s): CHOL, HDL, LDLCALC, TRIG, CHOLHDL, LDLDIRECT in the last 72 hours.  Thyroid Function Tests: No results for input(s): TSH, T4TOTAL, FREET4, T3FREE, THYROIDAB in the last 72 hours.  Anemia Panel: No results for input(s): VITAMINB12, FOLATE, FERRITIN, TIBC, IRON, RETICCTPCT in the last 72 hours.  Urine analysis:    Component Value Date/Time   COLORURINE YELLOW 12/30/2013 1003   APPEARANCEUR HAZY (A) 12/30/2013 1003   LABSPEC 1.017 12/30/2013 1003   PHURINE 8.5 (H) 12/30/2013 1003   GLUCOSEU NEGATIVE 12/30/2013 1003   GLUCOSEU NEGATIVE 11/10/2012 0954   HGBUR SMALL (A) 12/30/2013 1003   BILIRUBINUR NEGATIVE 12/30/2013 1003   KETONESUR NEGATIVE 12/30/2013 1003   PROTEINUR >300 (A) 12/30/2013 1003   UROBILINOGEN 0.2 12/30/2013 1003   NITRITE NEGATIVE 12/30/2013 1003   LEUKOCYTESUR MODERATE (A) 12/30/2013 1003    Sepsis Labs: @LABRCNTIP (procalcitonin:4,lacticidven:4) )No results found for this or any previous visit (from the past 240 hour(s)).   Radiological Exams on Admission: Dg Chest 2 View  Result Date: 03/04/2017 CLINICAL DATA:  Onset of mid sternal chest pain after receiving 1 hour hemodialysis. EXAM: CHEST  2 VIEW COMPARISON:  PA and lateral chest x-ray dated December 29, 2013 FINDINGS: The lungs are adequately inflated. There is no focal infiltrate. The interstitial markings are mildly increased. The cardiac silhouette is enlarged. The central pulmonary vascularity is prominent and less distinct than on the previous study. The patient has undergone previous CABG. The bony thorax exhibits no acute abnormality. IMPRESSION: Findings are consistent with low-grade interstitial edema. Stable cardiomegaly. No alveolar pneumonia. Electronically Signed   By: David  [place] M.D.   On: 03/04/2017 09:27    EKG: Independently reviewed.  Assessment/Plan Active Problems:   Hyperlipidemia   Anemia   Depression   Essential hypertension   S/P CABG x 4: 04/01/11-(LIMA to  LAD, SVG to PL branch of RCA, Sequential SVG to OM1/2)   Bladder neck obstruction   S/P PTCA (percutaneous transluminal coronary angioplasty), 06/30/11   NSTEMI (non-ST elevated myocardial infarction), 06/30/11   History of diastolic dysfunction, grade 2 by echo   Increased prostate specific antigen (PSA) velocity   ESRD on dialysis [facility]   CVD (cerebrovascular disease)   History of stroke   Mild cognitive impairment   OSA (obstructive sleep apnea)   Chest pain   Thrombocytopenia (HCC)     Chest pain syndrome/known CAD s/p CABG in 2013, last PCI 2015  and s/p MI   HEART score 7-8. Troponin 0.41 , EKG without significant changes .Dr. Royann Shivers , cardiology, has seen and evaluated the patient, and recommended ischemic evaluation with Myoview in a.m., versus repeating heart catheterization. Last 2D echo 12/31/2013 shows EF 65-70%.  Last heart catheterization on 06/30/2011 by Dr. Royann Shivers followed by PCI.  CXR without significant fluid overload. BNP elevated at 960 in view of Cardiorenal issues, but no overt acute on chronic heart failure   Admit to SDU IP  Chest pain order set Cycle troponins EKG in am continue ASA, O2 and NTG as needed Check Lipid panel  Hb A1C  Hypertension BP 105/70 Pulse 83  Continue home anti-hypertensive medications after his myoview test and possible HD  Hydralazine  for BP 180/90 prn q 8 h     ESRD on HD MWF Cr 14 in view of incomplete HD today, and prior missing dialysis last Monday  Primary Nephrologist Dr. Hyman Hopes. NA 132 and K 4.3  Dr. Hyman Hopes, nephrology, consulted on this patient, and recommended close monitoring, but at this time no additional treatment is needed. Appreciate their follow-up. Renal Diet with fluid restriction  Other plans as per Nephrology Check CMET in am    Hypocalcemia Ca 6.8   on admission in the setting of ESRD as above  Check  Ionized Ca , PTH, Vit D Level  Recheck CMET in am Contninue Renal Vit and other renal replenishment     Anemia of chronic disease Hemoglobin on admission 8 . Hemoccult pending  Patient to receive Aranesp tomorrow as per Nephrology   Repeat CBC in am  No transfusion is indicated at this time  Ferritin is pending   Dementia Continue Namenda   Anxiety Continue Xanax   History of urinary retention and prostate enlargement Urology is to see patient in consultation once Cardiac and renal issues are controlled, per EDP notes Continue Hytrin    History of CVA, remote, no new events COntinue to monitor PT/OT  Continue ASA and Plavix   OSA Continue CPAP nightly  Thrombocytopenia, current count is 83k, last recorded plt count in this facility in 10/2016 was in the  200s . Spoke with Nephrology, his PLt count was 297 on 10/24  Denies bleed No transfusion is indicated at this time Monitor counts closely Transfuse 1 unit of platelets if count is less or equal than 10,000 or 20,000 if the patient is acutely bleeding Check HIT  Hold Heparin. Argatroban  per Pharmacy dose   DVT prophylaxis:Argatroban per Pharmacy    Code Status:    Full  Family Communication:  Discussed with patient Disposition Plan: Expect patient to be discharged to home after condition improves Consults called:    Cardiology and Nephrology per EDP  Admission status: Stepdown IP    Marlowe Kays, PA-C [facility]   03/04/2017, 4:41 PM

## 2017-03-04 NOTE — Progress Notes (Signed)
    Troponin elevated to 5.11. Dr. Royann Shiversroitoru notified. Stress test canceled. Plan for cardiac catheterization tomorrow. Added to cath schedule for 9am tentatively.  Pt informed of elevated troponin and plan to do cardiac cath to definitively evaluate coronary arteries and bypass grafts. He has had multiple caths in the past. The patient understands that risks included but are not limited to stroke (1 in 1000), death (1 in 1000), kidney failure [usually temporary] (1 in 500), bleeding (1 in 200), allergic reaction [possibly serious] (1 in 200).   He agrees with the plan. He is currently comfortable without chest pain.   Berton BonJanine Brodin Gelpi, AGNP-C [facility] 03/04/2017  7:25 PM Pager: [phone]

## 2017-03-04 NOTE — Consult Note (Signed)
Referring Provider: No ref. provider found Primary Care Physician:  Corwin Levins, MD Primary Nephrologist:  Dr. Hyman Hopes  Reason for Consultation:  ESRD  Medical management  Anemia and secondary hyperparathyroidism  HPI: CAD (s/p CABG 04/01/11; s/p DES to LAD via LIMA graft 07/05/11), CVA (12/29/13), HTN, hyperlipidemia, OSA, ESRD on hemodialysis, anemia, impaired glucose tolerance, seizures (medication induced), cognitive impairment He was admitted after complaining of shortness of breath and chest pain about 1 hour into his dialysis unit  He has had a progressively increase in anemia noted although denies and GI blood loss or any increase blood losses from his AVF He states the pain was relieved with the placement of a NTG patch He denied diaphoresis He states that he has been compliant with his dialysis treatments    Past Medical History:  Diagnosis Date  . ACHALASIA   . ANEMIA-NOS   . CAD (coronary artery disease), 3 vessel significant disease.    . CEREBROVASCULAR ACCIDENT, HX OF   . CHF (congestive heart failure) (HCC)   . CKD (chronic kidney disease) stage 4, GFR 15-29 ml/min (HCC)   . Constipation   . DEGENERATIVE JOINT DISEASE, SHOULDER    knee  . DEPRESSION    2006- not depressed any longer  . Family history of adverse reaction to anesthesia    Mother, hard to awaken  . GERD    no meds required  . Gout    takes Uloric daily  . H/O hiatal hernia   . Headache(784.0)   . Heart attack (HCC)   . History of colon polyps   . History of diastolic dysfunction, grade 2 by echo   . HYPERLIPIDEMIA    takes crestor daily  . Hyperlipidemia   . HYPERTENSION    takes Norvasc,Catapress,Hydralazine,Imdur,and Metoprolol daily  . Impaired glucose tolerance   . Insomnia    takes Ambien nightly  . NSTEMI (non-ST elevated myocardial infarction), 06/30/11 07/01/2011  . RENAL CALCULUS, HX OF   . RENAL INSUFFICIENCY    12/2013- TThSat  . S/P coronary artery stent placement, PTCA/DES  Resolute to mid-LAD via the LIMA graft, and PTCA of apical 95% stenosis 07/05/11 07/04/2011  . S/P PTCA (percutaneous transluminal coronary angioplasty), 06/30/11 07/01/2011  . Seizures (HCC)    INSULIN INDUCED  . Sleep apnea    SLEEP STUDY IN [place] , NO MACHINE YET  1 YEAR  . Stroke (HCC)    93/2005/2006/2007;left sided weakness.  1993 12/2013    Past Surgical History:  Procedure Laterality Date  . ARCH AORTOGRAM N/A 02/19/2012   Procedure: ARCH AORTOGRAM;  Surgeon: Fransisco Hertz, MD;  Location: [facility] CATH LAB;  Service: Cardiovascular;  Laterality: N/A;  . AV FISTULA PLACEMENT  12/02/2011   Procedure: ARTERIOVENOUS (AV) FISTULA CREATION;  Surgeon: Fransisco Hertz, MD;  Location: [facility] OR;  Service: Vascular;  Laterality: Left;  Ultrasound Guided  . AV FISTULA PLACEMENT Left 06/08/2012   Procedure: ARTERIOVENOUS (AV) FISTULA CREATION;  Surgeon: Fransisco Hertz, MD;  Location: [facility] OR;  Service: Vascular;  Laterality: Left;  . BASCILIC VEIN TRANSPOSITION Left 09/06/2014   Procedure: FIRST STAGE BASILIC VEIN TRANSPOSITION ;  Surgeon: Fransisco Hertz, MD;  Location: [facility] OR;  Service: Vascular;  Laterality: Left;  . BASCILIC VEIN TRANSPOSITION Left 12/06/2014   Procedure: SECOND STAGE BRACHIAL VEIN TRANSPOSITION ;  Surgeon: Fransisco Hertz, MD;  Location: [facility] OR;  Service: Vascular;  Laterality: Left;  . CARDIAC CATHETERIZATION    . COLONOSCOPY    . CORONARY  ANGIOPLASTY     06/30/11   AT [facility]  . CORONARY ARTERY BYPASS GRAFT  04/01/2011   Procedure: CORONARY ARTERY BYPASS GRAFTING (CABG);  Surgeon: Kathlee Nations Suann Larry, MD;  Location: [facility] OR;  Service: Open Heart Surgery;  Laterality: N/A;  . HERNIA REPAIR  2013  . INSERTION OF DIALYSIS CATHETER  04/01/2011   Procedure: INSERTION OF DIALYSIS CATHETER;  Surgeon: Nilda Simmer, MD;  Location: [facility] OR;  Service: Vascular;  Laterality: N/A;  . KNEE SURGERY Right 01/07/2011   arthroscopy  . LEFT HEART CATHETERIZATION WITH CORONARY ANGIOGRAM N/A 03/28/2011   Procedure: LEFT  HEART CATHETERIZATION WITH CORONARY ANGIOGRAM;  Surgeon: Lennette Bihari, MD;  Location: [facility] CATH LAB;  Service: Cardiovascular;  Laterality: N/A;  pending creatnine  . LEFT HEART CATHETERIZATION WITH CORONARY/GRAFT ANGIOGRAM N/A 06/30/2011   Procedure: LEFT HEART CATHETERIZATION WITH Isabel Caprice;  Surgeon: Thurmon Fair, MD;  Location: [facility] CATH LAB;  Service: Cardiovascular;  Laterality: N/A;  . LIGATION OF ARTERIOVENOUS  FISTULA Left 06/08/2012   Procedure: LIGATION OF ARTERIOVENOUS  FISTULA;  Surgeon: Fransisco Hertz, MD;  Location: [facility] OR;  Service: Vascular;  Laterality: Left;  . NM MYOCAR PERF EJECTION FRACTION  06/25/2011   There is evidence of mild ischemia in the basal inferolateral and mid inferolateral regions. (Extent 7 %) The post-stress ejection fraction is 44%. There is mild hypocontractility in the distal inferoapical segment. baseline T wave inversion is noted in leads I, avL, II, V2. This is a low risk scan. The present perfusion study suggests significant myocardial salvage with mild residual ischemia .  Marland Kitchen PERCUTANEOUS CORONARY INTERVENTION-BALLOON ONLY  06/30/2011   Procedure: PERCUTANEOUS CORONARY INTERVENTION-BALLOON ONLY;  Surgeon: Thurmon Fair, MD;  Location: [facility] CATH LAB;  Service: Cardiovascular;;  . PERCUTANEOUS CORONARY STENT INTERVENTION (PCI-S) N/A 07/04/2011   Procedure: PERCUTANEOUS CORONARY STENT INTERVENTION (PCI-S);  Surgeon: Lennette Bihari, MD;  Location: [facility] CATH LAB;  Service: Cardiovascular;  Laterality: N/A;  . REVISION OF ARTERIOVENOUS GORETEX GRAFT Left 12/06/2014   Procedure: REVISION OF ARTERIOVENOUS GORETEX GRAFT USING 85mmx10cm;  Surgeon: Fransisco Hertz, MD;  Location: [facility] OR;  Service: Vascular;  Laterality: Left;  . STOMACH SURGERY  2009   achalasia  . SUBCLAVIAN STENT PLACEMENT  12/20/2011  . TONSILLECTOMY    . TONSILLECTOMY      Prior to Admission medications   Medication Sig Start Date End Date Taking? Authorizing Provider  amLODipine (NORVASC) 5  MG tablet TAKE ONE TABLET BY MOUTH ONE TIME DAILY  12/08/16  Yes Runell Gess, MD  aspirin EC 81 MG EC tablet Take 1 tablet (81 mg total) by mouth daily. 07/07/11  Yes Dwana Melena, PA-C  cinacalcet (SENSIPAR) 90 MG tablet Take 90 mg by mouth at bedtime.   Yes [provider]  cloNIDine (CATAPRES) 0.2 MG tablet TAKE ONE TABLET BY MOUTH TWICE DAILY  12/08/16  Yes Runell Gess, MD  clopidogrel (PLAVIX) 75 MG tablet Take 1 tablet (75 mg total) by mouth daily. 10/14/16  Yes Levert Feinstein, MD  ketoconazole (NIZORAL) 2 % cream Apply 1 application topically daily. 12/18/16  Yes Vivi Barrack, DPM  lanthanum ([name]) 1000 MG chewable tablet Chew 2 tablets (2,000 mg total) by mouth 3 (three) times daily with meals. Yearly physical due in July must see MD for refills 09/10/15  Yes Corwin Levins, MD  lidocaine-prilocaine (EMLA) cream Apply 1 application topically as needed. Apply as needed for dialysis site   Yes [provider]  memantine (NAMENDA) 10 MG tablet Take 1 tablet (10 mg total) by mouth 2 (two) times daily. 03/20/16  Yes Levert Feinstein, MD  metoprolol (LOPRESSOR) 50 MG tablet Take 1 tablet (50 mg total) by mouth 2 (two) times daily. 09/13/14  Yes Corwin Levins, MD  NITROSTAT 0.4 MG SL tablet PLACE 1 TABLET UNDER TONGUE AS NEEDED FOR CHEST PAIN EVERY 5 MINUTES (MAX 3 DOSES) 10/02/15  Yes Runell Gess, MD  terazosin (HYTRIN) 10 MG capsule TAKE 1 CAPSULE BY MOUTH DAILY AT BEDTIME 11/11/16  Yes Corwin Levins, MD  atorvastatin (LIPITOR) 40 MG tablet Take 1 tablet (40 mg total) by mouth daily. 11/04/16 02/02/17  Runell Gess, MD  febuxostat (ULORIC) 40 MG tablet Take 40 mg by mouth daily as needed (gout).  05/13/11   Corwin Levins, MD  urea (GORDONS UREA) 40 % ointment Apply topically as needed. Patient not taking: Reported on 03/04/2017 12/18/16   Vivi Barrack, DPM  zolpidem (AMBIEN) 10 MG tablet TAKE ONE TABLET BY MOUTH AT BEDTIME AS NEEDED FOR SLEEP Patient not taking:  Reported on 03/04/2017 11/12/16   Corwin Levins, MD    Current Facility-Administered Medications  Medication Dose Route Frequency Provider Last Rate Last Dose  . 0.9 %  sodium chloride infusion   Intravenous Continuous Marcos Eke, PA-C      . acetaminophen (TYLENOL) tablet 650 mg  650 mg Oral Q4H PRN Marcos Eke, PA-C      . ALPRAZolam Prudy Feeler) tablet 0.25 mg  0.25 mg Oral BID PRN Marcos Eke, PA-C      . cinacalcet (SENSIPAR) tablet 90 mg  90 mg Oral QHS Wertman, Sara E, PA-C      . clopidogrel (PLAVIX) tablet 75 mg  75 mg Oral Daily Wertman, Sara E, PA-C      . [START ON 03/05/2017] Darbepoetin Alfa (ARANESP) injection 100 mcg  100 mcg Intravenous Once in dialysis Weston Settle, PA-C      . [START ON 03/05/2017] doxercalciferol (HECTOROL) injection 3 mcg  3 mcg Intravenous Once in dialysis Weston Settle, PA-C      . heparin injection 5,000 Units  5,000 Units Subcutaneous Q8H Berton Mount I, MD      . lanthanum [facility]) chewable tablet 2,000 mg  2,000 mg Oral TID WC Wertman, Sung Amabile, PA-C      . memantine (NAMENDA) tablet 10 mg  10 mg Oral BID Marcos Eke, PA-C      . ondansetron [facility]) injection 4 mg  4 mg Intravenous Q6H PRN Marcos Eke, PA-C      . terazosin (HYTRIN) capsule 10 mg  10 mg Oral QHS Marcos Eke, PA-C       Current Outpatient Medications  Medication Sig Dispense Refill  . amLODipine (NORVASC) 5 MG tablet TAKE ONE TABLET BY MOUTH ONE TIME DAILY  30 tablet 11  . aspirin EC 81 MG EC tablet Take 1 tablet (81 mg total) by mouth daily.    . cinacalcet (SENSIPAR) 90 MG tablet Take 90 mg by mouth at bedtime.    . cloNIDine (CATAPRES) 0.2 MG tablet TAKE ONE TABLET BY MOUTH TWICE DAILY  30 tablet 11  . clopidogrel (PLAVIX) 75 MG tablet Take 1 tablet (75 mg total) by mouth daily. 90 tablet 4  . ketoconazole (NIZORAL) 2 % cream Apply 1 application topically daily. 60 g 2  . lanthanum ([name]) 1000 MG chewable tablet Chew 2 tablets (2,000 mg total)  by mouth  3 (three) times daily with meals. Yearly physical due in July must see MD for refills 540 tablet 0  . lidocaine-prilocaine (EMLA) cream Apply 1 application topically as needed. Apply as needed for dialysis site    . memantine (NAMENDA) 10 MG tablet Take 1 tablet (10 mg total) by mouth 2 (two) times daily. 60 tablet 11  . metoprolol (LOPRESSOR) 50 MG tablet Take 1 tablet (50 mg total) by mouth 2 (two) times daily. 180 tablet 3  . NITROSTAT 0.4 MG SL tablet PLACE 1 TABLET UNDER TONGUE AS NEEDED FOR CHEST PAIN EVERY 5 MINUTES (MAX 3 DOSES) 25 tablet 4  . terazosin (HYTRIN) 10 MG capsule TAKE 1 CAPSULE BY MOUTH DAILY AT BEDTIME 90 capsule 2  . atorvastatin (LIPITOR) 40 MG tablet Take 1 tablet (40 mg total) by mouth daily. 90 tablet 3  . febuxostat (ULORIC) 40 MG tablet Take 40 mg by mouth daily as needed (gout).     . urea (GORDONS UREA) 40 % ointment Apply topically as needed. (Patient not taking: Reported on 03/04/2017) 30 g 0  . zolpidem (AMBIEN) 10 MG tablet TAKE ONE TABLET BY MOUTH AT BEDTIME AS NEEDED FOR SLEEP (Patient not taking: Reported on 03/04/2017) 90 tablet 1    Allergies as of 03/04/2017 - Review Complete 03/04/2017  Allergen Reaction Noted  . Shrimp [shellfish allergy] Shortness Of Breath 03/27/2011  . Atorvastatin Other (See Comments) 05/22/2008  . Insulins Other (See Comments) 04/01/2011  . Simvastatin Other (See Comments) 05/22/2008  . Ultram [tramadol] Other (See Comments) 09/10/2011    Family History  Problem Relation Age of Onset  . Cancer Mother   . Heart disease Mother   . Hyperlipidemia Mother   . Hypertension Mother   . Alzheimer's disease Mother   . Cancer Father   . Heart disease Father   . Hypertension Other   . Stroke Other   . Hyperlipidemia Other     Social History   Socioeconomic History  . Marital status: Divorced    Spouse name: Not on file  . Number of children: 1  . Years of education: Bachelors  . Highest education level: Not on  file  Social Needs  . Financial resource strain: Not on file  . Food insecurity - worry: Not on file  . Food insecurity - inability: Not on file  . Transportation needs - medical: Not on file  . Transportation needs - non-medical: Not on file  Occupational History  . Occupation: Disabled  Tobacco Use  . Smoking status: Never Smoker  . Smokeless tobacco: Former NeurosurgeonUser    Types: Chew  . Tobacco comment: chewed tobacco   Substance and Sexual Activity  . Alcohol use: Yes    Alcohol/week: 1.2 oz    Types: 2 Shots of liquor per week    Comment: rarely; beer  . Drug use: No  . Sexual activity: Not Currently    Partners: Male  Other Topics Concern  . Not on file  Social History Narrative   Lives at home alone.   Right-handed.   Occasional caffeine use.    Review of Systems: Gen: Denies any fever, chills, sweats, anorexia, fatigue, weakness, malaise, weight loss, and sleep disorder HEENT: No visual complaints, No history of Retinopathy. Normal external appearance No Epistaxis or Sore throat. No sinusitis.   CV: Admits to chest pain, sounds like angina,no palpitations, no syncope, no orthopnea, no PND, no peripheral edema, and no claudication. Resp: Denies dyspnea at rest, dyspnea with exercise, cough, sputum,  wheezing, coughing up blood, and pleurisy. GI: Denies vomiting blood, jaundice, and fecal incontinence.   Denies dysphagia or odynophagia. GU : ESRD  MS: Denies joint pain, limitation of movement, and swelling, stiffness, low back pain, extremity pain. Denies muscle weakness, cramps, atrophy.  No use of non steroidal antiinflammatory drugs. Derm: Denies rash, itching, dry skin, hives, moles, warts, or unhealing ulcers.  Psych: Denies depression, anxiety, memory loss, suicidal ideation, hallucinations, paranoia, and confusion. Heme: Denies bruising, bleeding, and enlarged lymph nodes. Neuro: No headache.  No diplopia. No dysarthria.  No dysphasia.  No history of CVA.  No Seizures.  No paresthesias.  No weakness. Endocrine No DM.  No Thyroid disease.  No Adrenal disease.  Physical Exam: Vital signs in last 24 hours: Temp:  [98.5 F (36.9 C)] 98.5 F (36.9 C) (12/19 0820) Pulse Rate:  [86-106] 86 (12/19 1500) Resp:  [14-25] 19 (12/19 1500) BP: (120-166)/(78-103) 120/78 (12/19 1500) SpO2:  [92 %-99 %] 96 % (12/19 1500) Weight:  [226 lb (102.5 kg)] 226 lb (102.5 kg) (12/19 0820)   General:   Alert,  Well-developed, well-nourished, pleasant and cooperative in NAD   Head:  Normocephalic and atraumatic. Eyes:  Sclera clear, no icterus.   Conjunctiva pink. Ears:  Normal auditory acuity. Nose:  No deformity, discharge,  or lesions. Mouth:  No deformity or lesions, dentition normal. Neck:  Supple; no masses or thyromegaly. JVP not elevated Lungs:  Clear throughout to auscultation.   No wheezes, crackles, or rhonchi. No acute distress. Heart:  Regular rate and rhythm; no murmurs, clicks, rubs,  or gallops. Abdomen:  Soft, nontender and nondistended. No masses, hepatosplenomegaly or hernias noted. Normal bowel sounds, without guarding, and without rebound.   Msk:  Symmetrical without gross deformities. Normal posture. Pulses:  No carotid, renal, femoral bruits. DP and PT symmetrical and equal Extremities:  Without clubbing or edema.  AVF with thrill and bruit Neurologic:  Alert and  oriented x4;  grossly normal neurologically. Skin:  Intact without significant lesions or rashes. Cervical Nodes:  No significant cervical adenopathy. Psych:  Alert and cooperative. Normal mood and affect.  Intake/Output from previous day: No intake/output data recorded. Intake/Output this shift: No intake/output data recorded.  Lab Results: Recent Labs    03/04/17 0958  WBC 7.9  HGB 8.0*  HCT 24.0*  PLT 83*   BMET Recent Labs    03/04/17 0958  NA 132*  K 4.3  CL 93*  CO2 22  GLUCOSE 78  BUN 37*  CREATININE 14.24*  CALCIUM 6.8*   LFT No results for input(s): PROT,  ALBUMIN, AST, ALT, ALKPHOS, BILITOT, BILIDIR, IBILI in the last 72 hours. PT/INR No results for input(s): LABPROT, INR in the last 72 hours. Hepatitis Panel No results for input(s): HEPBSAG, HCVAB, HEPAIGM, HEPBIGM in the last 72 hours.  Studies/Results: Dg Chest 2 View  Result Date: 03/04/2017 CLINICAL DATA:  Onset of mid sternal chest pain after receiving 1 hour hemodialysis. EXAM: CHEST  2 VIEW COMPARISON:  PA and lateral chest x-ray dated December 29, 2013 FINDINGS: The lungs are adequately inflated. There is no focal infiltrate. The interstitial markings are mildly increased. The cardiac silhouette is enlarged. The central pulmonary vascularity is prominent and less distinct than on the previous study. The patient has undergone previous CABG. The bony thorax exhibits no acute abnormality. IMPRESSION: Findings are consistent with low-grade interstitial edema. Stable cardiomegaly. No alveolar pneumonia. Electronically Signed   By: David  [place] M.D.   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

## 2017-03-04 NOTE — ED Triage Notes (Signed)
Pt to ED via [facility] -- from dialysis center-- received 1 hour treatment -- started having chest pain, midsternal-- received ASA 324mg  -- NTG x 2 sl with no relief,

## 2017-03-05 ENCOUNTER — Encounter (HOSPITAL_COMMUNITY): Payer: Self-pay | Admitting: Cardiovascular Disease

## 2017-03-05 ENCOUNTER — Other Ambulatory Visit (HOSPITAL_COMMUNITY): Payer: Medicare Other

## 2017-03-05 ENCOUNTER — Inpatient Hospital Stay (HOSPITAL_COMMUNITY)
Admission: EM | Disposition: A | Discharge status: Home / Self Care | Payer: Self-pay | Source: Home / Self Care | Attending: Internal Medicine

## 2017-03-05 ENCOUNTER — Other Ambulatory Visit: Payer: Self-pay

## 2017-03-05 DIAGNOSIS — I5032 Chronic diastolic (congestive) heart failure: Secondary | ICD-10-CM

## 2017-03-05 DIAGNOSIS — D638 Anemia in other chronic diseases classified elsewhere: Secondary | ICD-10-CM

## 2017-03-05 DIAGNOSIS — I214 Non-ST elevation (NSTEMI) myocardial infarction: Secondary | ICD-10-CM

## 2017-03-05 DIAGNOSIS — I200 Unstable angina: Secondary | ICD-10-CM

## 2017-03-05 DIAGNOSIS — I10 Essential (primary) hypertension: Secondary | ICD-10-CM

## 2017-03-05 DIAGNOSIS — N400 Enlarged prostate without lower urinary tract symptoms: Secondary | ICD-10-CM

## 2017-03-05 DIAGNOSIS — R079 Chest pain, unspecified: Secondary | ICD-10-CM

## 2017-03-05 DIAGNOSIS — G4733 Obstructive sleep apnea (adult) (pediatric): Secondary | ICD-10-CM

## 2017-03-05 DIAGNOSIS — R338 Other retention of urine: Secondary | ICD-10-CM

## 2017-03-05 HISTORY — PX: LEFT HEART CATH AND CORS/GRAFTS ANGIOGRAPHY: CATH118250

## 2017-03-05 LAB — COMPREHENSIVE METABOLIC PANEL
ALBUMIN: 3.2 g/dL — AB (ref 3.5–5.0)
ALT: 17 U/L (ref 17–63)
ANION GAP: 15 (ref 5–15)
AST: 39 U/L (ref 15–41)
Alkaline Phosphatase: 44 U/L (ref 38–126)
BILIRUBIN TOTAL: 0.9 mg/dL (ref 0.3–1.2)
BUN: 40 mg/dL — ABNORMAL HIGH (ref 6–20)
CO2: 25 mmol/L (ref 22–32)
Calcium: 7.1 mg/dL — ABNORMAL LOW (ref 8.9–10.3)
Chloride: 93 mmol/L — ABNORMAL LOW (ref 101–111)
Creatinine, Ser: 15.51 mg/dL — ABNORMAL HIGH (ref 0.61–1.24)
GFR, EST AFRICAN AMERICAN: 3 mL/min — AB (ref >60)
GFR, EST NON AFRICAN AMERICAN: 3 mL/min — AB (ref >60)
GLUCOSE: 110 mg/dL — AB (ref 65–99)
POTASSIUM: 4.2 mmol/L (ref 3.5–5.1)
Sodium: 133 mmol/L — ABNORMAL LOW (ref 135–145)
TOTAL PROTEIN: 6.2 g/dL — AB (ref 6.5–8.1)

## 2017-03-05 LAB — CBC
HCT: 24.1 % — ABNORMAL LOW (ref 39.0–52.0)
Hemoglobin: 7.9 g/dL — ABNORMAL LOW (ref 13.0–17.0)
MCH: 31.7 pg (ref 26.0–34.0)
MCHC: 32.8 g/dL (ref 30.0–36.0)
MCV: 96.8 fL (ref 78.0–100.0)
Platelets: 189 10*3/uL (ref 150–400)
RBC: 2.49 MIL/uL — ABNORMAL LOW (ref 4.22–5.81)
RDW: 13.4 % (ref 11.5–15.5)
WBC: 7.7 10*3/uL (ref 4.0–10.5)

## 2017-03-05 LAB — APTT
aPTT: 59 seconds — ABNORMAL HIGH (ref 24–36)
aPTT: 64 seconds — ABNORMAL HIGH (ref 24–36)

## 2017-03-05 LAB — IRON AND TIBC
Iron: 35 ug/dL — ABNORMAL LOW (ref 45–182)
Saturation Ratios: 19 % (ref 17.9–39.5)
TIBC: 182 ug/dL — ABNORMAL LOW (ref 250–450)
UIBC: 147 ug/dL

## 2017-03-05 LAB — PARATHYROID HORMONE, INTACT (NO CA): PTH: 315 pg/mL — ABNORMAL HIGH (ref 15–65)

## 2017-03-05 LAB — POCT ACTIVATED CLOTTING TIME: Activated Clotting Time: 180 seconds

## 2017-03-05 LAB — VITAMIN D 25 HYDROXY (VIT D DEFICIENCY, FRACTURES): VIT D 25 HYDROXY: 18.8 ng/mL — AB (ref 30.0–100.0)

## 2017-03-05 LAB — FERRITIN: Ferritin: 844 ng/mL — ABNORMAL HIGH (ref 24–336)

## 2017-03-05 LAB — PHOSPHORUS: Phosphorus: 5.1 mg/dL — ABNORMAL HIGH (ref 2.5–4.6)

## 2017-03-05 LAB — HIV ANTIBODY (ROUTINE TESTING W REFLEX): HIV Screen 4th Generation wRfx: NONREACTIVE

## 2017-03-05 LAB — CALCIUM, IONIZED: Calcium, Ionized, Serum: 3.6 mg/dL — ABNORMAL LOW (ref 4.5–5.6)

## 2017-03-05 SURGERY — LEFT HEART CATH AND CORS/GRAFTS ANGIOGRAPHY
Anesthesia: LOCAL

## 2017-03-05 MED ORDER — ALTEPLASE 2 MG IJ SOLR: 2.0000 mg | Freq: Once | INTRAMUSCULAR | Status: DC | PRN

## 2017-03-05 MED ORDER — FONDAPARINUX SODIUM 2.5 MG/0.5ML ~~LOC~~ SOLN
2.5000 mg | Freq: Every day | SUBCUTANEOUS | Status: DC
  Administered 2017-03-05: 2.5 mg via SUBCUTANEOUS
  Filled 2017-03-05 (×2): qty 0.5

## 2017-03-05 MED ORDER — IOPAMIDOL (ISOVUE-370) INJECTION 76%
INTRAVENOUS | Status: AC
  Filled 2017-03-05: qty 125

## 2017-03-05 MED ORDER — DARBEPOETIN ALFA 100 MCG/0.5ML IJ SOSY
PREFILLED_SYRINGE | INTRAMUSCULAR | Status: AC
  Administered 2017-03-05: 100 ug via INTRAVENOUS
  Filled 2017-03-05: qty 0.5

## 2017-03-05 MED ORDER — ONDANSETRON HCL 4 MG/2ML IJ SOLN: 4.0000 mg | Freq: Four times a day (QID) | INTRAMUSCULAR | Status: DC | PRN

## 2017-03-05 MED ORDER — HEPARIN SODIUM (PORCINE) 1000 UNIT/ML DIALYSIS
1000.0000 [IU] | INTRAMUSCULAR | Status: DC | PRN
Start: 2017-03-05 — End: 2017-03-05

## 2017-03-05 MED ORDER — DOXERCALCIFEROL 4 MCG/2ML IV SOLN: 3.0000 ug | INTRAVENOUS | Status: DC

## 2017-03-05 MED ORDER — HEPARIN SODIUM (PORCINE) 1000 UNIT/ML DIALYSIS: 20.0000 [IU]/kg | INTRAMUSCULAR | Status: DC | PRN

## 2017-03-05 MED ORDER — PENTAFLUOROPROP-TETRAFLUOROETH EX AERO: 1.0000 "application " | INHALATION_SPRAY | CUTANEOUS | Status: DC | PRN

## 2017-03-05 MED ORDER — FENTANYL CITRATE (PF) 100 MCG/2ML IJ SOLN
INTRAMUSCULAR | Status: AC
  Filled 2017-03-05: qty 2

## 2017-03-05 MED ORDER — MIDAZOLAM HCL 2 MG/2ML IJ SOLN
INTRAMUSCULAR | Status: DC | PRN
  Administered 2017-03-05: 1 mg via INTRAVENOUS

## 2017-03-05 MED ORDER — SODIUM CHLORIDE 0.9 % IV SOLN: 100.0000 mL | INTRAVENOUS | Status: DC | PRN

## 2017-03-05 MED ORDER — HEPARIN (PORCINE) IN NACL 2-0.9 UNIT/ML-% IJ SOLN: INTRAMUSCULAR | Status: AC | PRN

## 2017-03-05 MED ORDER — LIDOCAINE HCL (PF) 1 % IJ SOLN
INTRAMUSCULAR | Status: AC
  Filled 2017-03-05: qty 30

## 2017-03-05 MED ORDER — SODIUM CHLORIDE 0.9% FLUSH: 3.0000 mL | INTRAVENOUS | Status: DC | PRN

## 2017-03-05 MED ORDER — LIDOCAINE-PRILOCAINE 2.5-2.5 % EX CREA: 1.0000 "application " | TOPICAL_CREAM | CUTANEOUS | Status: DC | PRN

## 2017-03-05 MED ORDER — FENTANYL CITRATE (PF) 100 MCG/2ML IJ SOLN
INTRAMUSCULAR | Status: DC | PRN
  Administered 2017-03-05: 25 ug via INTRAVENOUS

## 2017-03-05 MED ORDER — ASPIRIN 81 MG PO CHEW
81.0000 mg | CHEWABLE_TABLET | Freq: Every day | ORAL | Status: DC
  Administered 2017-03-06: 81 mg via ORAL
  Filled 2017-03-05: qty 1

## 2017-03-05 MED ORDER — ACETAMINOPHEN 325 MG PO TABS: 650.0000 mg | ORAL_TABLET | ORAL | Status: DC | PRN

## 2017-03-05 MED ORDER — CLOPIDOGREL BISULFATE 75 MG PO TABS
75.0000 mg | ORAL_TABLET | Freq: Every day | ORAL | Status: DC
  Administered 2017-03-06: 75 mg via ORAL
  Filled 2017-03-05: qty 1

## 2017-03-05 MED ORDER — LIDOCAINE HCL (PF) 1 % IJ SOLN
INTRAMUSCULAR | Status: DC | PRN
  Administered 2017-03-05: 25 mL

## 2017-03-05 MED ORDER — MORPHINE SULFATE (PF) 2 MG/ML IV SOLN: 2.0000 mg | INTRAVENOUS | Status: DC | PRN

## 2017-03-05 MED ORDER — DOXERCALCIFEROL 4 MCG/2ML IV SOLN
5.0000 ug | INTRAVENOUS | Status: DC
  Administered 2017-03-06: 5 ug via INTRAVENOUS
  Filled 2017-03-05: qty 4

## 2017-03-05 MED ORDER — SODIUM CHLORIDE 0.9% FLUSH
3.0000 mL | Freq: Two times a day (BID) | INTRAVENOUS | Status: DC
  Administered 2017-03-05: 3 mL via INTRAVENOUS

## 2017-03-05 MED ORDER — SODIUM CHLORIDE 0.9 % IV SOLN
125.0000 mg | INTRAVENOUS | Status: DC
  Administered 2017-03-06: 125 mg via INTRAVENOUS
  Filled 2017-03-05 (×3): qty 10

## 2017-03-05 MED ORDER — RENA-VITE PO TABS
1.0000 | ORAL_TABLET | Freq: Every day | ORAL | Status: DC
  Administered 2017-03-05: 1 via ORAL
  Filled 2017-03-05: qty 1

## 2017-03-05 MED ORDER — MORPHINE SULFATE (PF) 10 MG/ML IV SOLN: 2.0000 mg | INTRAVENOUS | Status: DC | PRN

## 2017-03-05 MED ORDER — SODIUM CHLORIDE 0.9 % IV SOLN
125.0000 mg | Freq: Once | INTRAVENOUS | Status: AC
  Administered 2017-03-05: 125 mg via INTRAVENOUS
  Filled 2017-03-05 (×2): qty 10

## 2017-03-05 MED ORDER — DOXERCALCIFEROL 4 MCG/2ML IV SOLN
INTRAVENOUS | Status: AC
  Filled 2017-03-05: qty 2

## 2017-03-05 MED ORDER — SODIUM CHLORIDE 0.9 % IV SOLN: 250.0000 mL | INTRAVENOUS | Status: DC | PRN

## 2017-03-05 MED ORDER — MIDAZOLAM HCL 2 MG/2ML IJ SOLN
INTRAMUSCULAR | Status: AC
  Filled 2017-03-05: qty 2

## 2017-03-05 MED ORDER — IOPAMIDOL (ISOVUE-370) INJECTION 76%
INTRAVENOUS | Status: DC | PRN
  Administered 2017-03-05: 115 mL via INTRAVENOUS

## 2017-03-05 MED ORDER — LIDOCAINE HCL (PF) 1 % IJ SOLN: 5.0000 mL | INTRAMUSCULAR | Status: DC | PRN

## 2017-03-05 MED ORDER — HEPARIN (PORCINE) IN NACL 2-0.9 UNIT/ML-% IJ SOLN
INTRAMUSCULAR | Status: AC
  Filled 2017-03-05: qty 1000

## 2017-03-05 SURGICAL SUPPLY — 9 items
CATH INFINITI 5 FR LCB (CATHETERS) ×2 IMPLANT
CATH INFINITI 5 FR RCB (CATHETERS) ×2 IMPLANT
CATH INFINITI 5FR MULTPACK ANG (CATHETERS) ×2 IMPLANT
KIT HEART LEFT (KITS) ×2 IMPLANT
PACK CARDIAC CATHETERIZATION (CUSTOM PROCEDURE TRAY) ×2 IMPLANT
SHEATH PINNACLE 5F 10CM (SHEATH) ×2 IMPLANT
SYR MEDRAD MARK V 150ML (SYRINGE) ×2 IMPLANT
TRANSDUCER W/STOPCOCK (MISCELLANEOUS) ×2 IMPLANT
WIRE EMERALD 3MM-J .035X150CM (WIRE) ×2 IMPLANT

## 2017-03-05 NOTE — Progress Notes (Signed)
PT Cancellation Note  Patient Details Name: [name] MRN: [id number] DOB: Jan 17, 1961   Cancelled Treatment:    Reason Eval/Treat Not Completed: Patient at procedure or test/unavailable.  Pt just back from CATH lab, will be on bed rest for a while.  Will see later as able. 03/05/2017  [name], PT [phone]  (pager)   Eliseo Gum Caspar Favila 03/05/2017, 12:18 PM

## 2017-03-05 NOTE — Progress Notes (Signed)
[facility] Progress Note   Dialysis Orders: MWF [facility] 4.5 hours 400/800 EDW 100./5 2 K 2.25 Ca profile 4 AVF heparin 4000 + 2000 mid tmt hectorol 3 no ESA or Fe Recent labs: hgb stable in 12s for several months then abrupt drop to 9 12/8 36% sat in Oct - missed November labs; OOT Ca low 7.2 P 8.6 12/17 - ??related to taking sensipar iPTH 247 October Compliance with treatment: poor: 12/12 3.25 hr missed 12/14 12/17 3.5 hr  Assessment/Plan: 1. NSTEMI hx prior CABG+ trop 5> 10 12/19 - cath today showed:  Diffuse disease, patent stent/bypass vessels- nothing revascularizable- nothing that specifically explains etiology of NSTEMI - plan medical therapy; stressed the need for attending all HD treatments and running FULL HD treatments to help with heart disease. 2. ESRD -MWF- only had 1 hr of HD Wed - plan finish HD today and then back on schedule Friday  K 4.2 on 3 K 2.5 Ca bath- next outpatient HD would be Sunday due to holiday schedule Hold heparin Thursday Lower vol, and bp 3. Anemia - abrupt drop in hgb noted * yesterday 8 and 7.9 today - stable tsat 19% down from 38% in October - FOBT ordered - replete Fe x 4 doses and start Aranesp 4. Secondary hyperparathyroidism  with hypocalcemia  Ca 7.1- could well be due to patient taking sensipar 90- have stopped and monitor levels; continue hectorol - use 2.5 Ca bath with HD P ok ; on 2 fosrenol ac but outpatient P levels were high; ipTH 315-reassess Ca levels /Ca bath at discharge ^ Vit D 5. HTN/volume - mild interstitial edema on CXR -titrate EDW down while here 6. Nutrition - renal diet/vit 7. Platelet variance- bounced back today - doubt signifcant or heparin related.  Sheffield Slider, PA-C [facility] Beeper [phone] 03/05/2017,9:04 AM  LOS: 1 day   Subjective:   Understands results of heart cath. Tells me he will be d/c Friday  Objective Vitals:   03/04/17 1936 03/05/17 0021 03/05/17 0331 03/05/17 0808  BP:  117/77 (!) 158/98 136/89 (!) 165/101  Pulse: 94 92 100 (!) 102  Resp: 18 (!) 22 20 19   Temp: 98.9 F (37.2 C) 98.7 F (37.1 C) 98.5 F (36.9 C) 98.7 F (37.1 C)  TempSrc: Oral Oral Oral Oral  SpO2: 95% 92% 91% 99%  Weight:   101.4 kg (223 lb 8 oz)   Height:       Physical Exam General:NAD , sleepy, post cath Heart: S4, Gr2/6 M, 1+ edema Lungs: decreased bs, no R,R or W Abdomen:soft,pos bs, liver down 5 cm Extremities: AVF LUA,  1+ edema Dialysis Access: LUA AVF   Additional Objective Labs: Basic Metabolic Panel: Recent Labs  Lab 03/04/17 0958 03/04/17 1629 03/05/17 0106  NA 132*  --  133*  K 4.3  --  4.2  CL 93*  --  93*  CO2 22  --  25  GLUCOSE 78  --  110*  BUN 37*  --  40*  CREATININE 14.24*  --  15.51*  CALCIUM 6.8*  --  7.1*  PHOS  --  5.0* 5.1*   Liver Function Tests: Recent Labs  Lab 03/05/17 0106  AST 39  ALT 17  ALKPHOS 44  BILITOT 0.9  PROT 6.2*  ALBUMIN 3.2*   No results for input(s): LIPASE, AMYLASE in the last 168 hours. CBC: Recent Labs  Lab 03/04/17 0958 03/05/17 0106  WBC 7.9 7.7  HGB 8.0* 7.9*  HCT 24.0* 24.1*  MCV 97.2 96.8  PLT 83* 189   Blood Culture    Component Value Date/Time   SDES BLOOD RIGHT HAND 12/29/2013 2221   SPECREQUEST BOTTLES DRAWN AEROBIC ONLY 7CC 12/29/2013 2221   CULT  12/29/2013 2221    NO GROWTH 5 DAYS [facility]   REPTSTATUS 01/05/2014 FINAL 12/29/2013 2221    Cardiac Enzymes: Recent Labs  Lab 03/04/17 1629 03/04/17 2112  TROPONINI 5.11* 10.36*   CBG: No results for input(s): GLUCAP in the last 168 hours. Iron Studies:  Recent Labs    03/05/17 0106  IRON 35*  TIBC 182*  FERRITIN 844*   Lab Results  Component Value Date   INR 1.48 03/04/2017   INR 1.27 12/29/2013   INR 1.11 12/02/2011   Studies/Results: Dg Chest 2 View  Result Date: 03/04/2017 CLINICAL DATA:  Onset of mid sternal chest pain after receiving 1 hour hemodialysis. EXAM: CHEST  2 VIEW  COMPARISON:  PA and lateral chest x-ray dated December 29, 2013 FINDINGS: The lungs are adequately inflated. There is no focal infiltrate. The interstitial markings are mildly increased. The cardiac silhouette is enlarged. The central pulmonary vascularity is prominent and less distinct than on the previous study. The patient has undergone previous CABG. The bony thorax exhibits no acute abnormality. IMPRESSION: Findings are consistent with low-grade interstitial edema. Stable cardiomegaly. No alveolar pneumonia. Electronically Signed   By: David  [place] M.D.   On: 03/04/2017 09:27   Medications: . sodium chloride Stopped (03/05/17 0601)  . sodium chloride    . sodium chloride 10 mL/hr at 03/05/17 0601  . argatroban 0.6 mcg/kg/min (03/05/17 0521)   . [MAR Hold] clopidogrel  75 mg Oral Daily  . [MAR Hold] darbepoetin (ARANESP) injection - DIALYSIS  100 mcg Intravenous Q Thu-HD  . [MAR Hold] doxercalciferol  3 mcg Intravenous Once in dialysis  . [MAR Hold] lanthanum  2,000 mg Oral TID WC  . [MAR Hold] memantine  10 mg Oral BID  . sodium chloride flush  3 mL Intravenous Q12H  . [MAR Hold] terazosin  10 mg Oral QHS

## 2017-03-05 NOTE — Procedures (Signed)
I was present at this session.  I have reviewed the session itself and made appropriate changes.  Access press ok.. Both needles up so some recirc. bp ^, ^ UFR.    Fayrene Fearing Joia Doyle 12/20/20182:15 PM

## 2017-03-05 NOTE — Progress Notes (Addendum)
PROGRESS NOTE    [name]  BUL:[id number] DOB: 1961/01/05 DOA: 03/04/2017 PCP: Corwin Levins, MD   Brief Narrative:  56 y.o. BM PMHx Seizures, Depression,CVA,NSTEMI, HTN,CAD S/P CABG in 2013, S/P coronary artery stent placement, PTCA/DES Resolute to mid-LAD via the LIMA graft, and PTCA of apical 95% stenosis 07/05/11, ESRD on HD M/W/F followed by Dr.Webb missing hemodialysis last Monday, HLD,  ACHALASIA, Gout, OSA  Presenting to the emergency department with significant chest pain. The patient reported that after having 1 hour of hemodialysis, he became short of breath, is well as spirits seem sharp, midsternal chest pain without radiation, associated with nausea. He denies any diaphoresis. The patient reports these symptoms being similar to those of his prior MIs. He was placed on oxygen, improving his shortness of breath. On transport, he received 324 mg of aspirin, and 2 sublingual nitroglycerin, without significant relief. On presentation, his pain was 6 out of 10. He denies any positional or pleuritic chest pain. He denies any lower extremity swelling pain.     Subjective: 12/20 -O 4, negative CP, negative SOB, negative abdominal pain, negative N/V. Patient states Ms. 2 HD session secondary to being out of town.   Assessment & Plan:   Active Problems:   Hyperlipidemia   Anemia   Depression   Essential hypertension   S/P CABG x 4: 04/01/11-(LIMA to LAD, SVG to PL branch of RCA, Sequential SVG to OM1/2)   Bladder neck obstruction   S/P PTCA (percutaneous transluminal coronary angioplasty), 06/30/11   NSTEMI (non-ST elevated myocardial infarction), 06/30/11   History of diastolic dysfunction, grade 2 by echo   Increased prostate specific antigen (PSA) velocity   ESRD on dialysis [facility])   CVD (cerebrovascular disease)   History of stroke   Mild cognitive impairment   OSA (obstructive sleep apnea)   Chest pain   Thrombocytopenia (HCC)   Chest pain syndrome/ CAD (HEART score  7-8.) -s/p CABG in 2013, last PCI 2015  and s/p MI    -12/20 S/P cardiac cath  Chronic diastolic CHF -Echocardiogram:    Essential Hypertension BP 105/70 Pulse 83  -Continue home anti-hypertensive medications after his myoview test and possible HD  -Hydralazine for BP 180/90 prn q 8 h     ESRD on HD M/W/F  -HD per nephrology: Last HD 12/20     Hypocalcemia  -Ca 6.8   on admission in the setting of ESRD as above  -Check  Ionized Ca , PTH, Vit D Level   -Contninue Renal Vit and other renal replenishment    Anemia of chronic disease  -Hemoglobin on admission 8 . Hemoccult pending  Patient to receive Aranesp tomorrow as per    Dementia -Must be stringy mild  -Continue Namenda    Anxiety -Continue Xanax    History of urinary retention and prostate enlargement -Per admission note Urology is to see patient in consultation once Cardiac and renal issues are controlled, per EDP notes: May be better handled as outpatient issue -Continue Hytrin     History of CVA, remote,    OSA -Continue CPAP nightly   Thrombocytopenia, (admission count is 83k), - last recorded plt count in this facility in 10/2016 was in the  200s .  -Review of EMR shows admitting attending Spoke with Nephrology, his PLt count was 297 on 10/24  Denies bleed -No transfusion is indicated at this time -Transfuse 1 unit of platelets if count is less or equal than 10,000 or 20,000 if the patient is  acutely bleeding -Discussed case with pharmacist Cala Bradford discontinue Argatroban: Will start Fondaparinux per pharmacy for VTE prophylaxis -HIT panel pending   -Hold all heparin products Hold Heparin.     DVT prophylaxis: Fondaparinux per pharmacy Code Status: Full Family Communication: None Disposition Plan: TBD   Consultants:  Nephrology Cardiology   Procedures/Significant Events:  12/20 cardiac catheterization   I have personally reviewed and interpreted all radiology studies and my findings are as  above.  VENTILATOR SETTINGS:    Cultures   Antimicrobials:    Devices    LINES / TUBES:      Continuous Infusions: . sodium chloride Stopped (03/05/17 0601)  . sodium chloride    . sodium chloride 10 mL/hr at 03/05/17 0601  . argatroban 0.6 mcg/kg/min (03/05/17 0521)     Objective: Vitals:   03/04/17 1616 03/04/17 1936 03/05/17 0021 03/05/17 0331  BP: 105/77 117/77 (!) 158/98 136/89  Pulse: 83 94 92 100  Resp: 18 18 (!) 22 20  Temp: 98.2 F (36.8 C) 98.9 F (37.2 C) 98.7 F (37.1 C) 98.5 F (36.9 C)  TempSrc: Oral Oral Oral Oral  SpO2: 100% 95% 92% 91%  Weight: 225 lb 1.6 oz (102.1 kg)   223 lb 8 oz (101.4 kg)  Height: 6' (1.829 m)       Intake/Output Summary (Last 24 hours) at 03/05/2017 1610 Last data filed at 03/05/2017 9604 Gross per 24 hour  Intake 940.01 ml  Output -  Net 940.01 ml   Filed Weights   03/04/17 0820 03/04/17 1616 03/05/17 0331  Weight: 226 lb (102.5 kg) 225 lb 1.6 oz (102.1 kg) 223 lb 8 oz (101.4 kg)    Examination:  General: A/O 4, No acute respiratory distress Neck:  Negative scars, masses, torticollis, lymphadenopathy, JVD Lungs: Clear to auscultation bilaterally without wheezes or crackles Cardiovascular: Regular rate and rhythm without murmur gallop or rub normal S1 and S2 Abdomen: negative abdominal pain, nondistended, positive soft, bowel sounds, no rebound, no ascites, no appreciable mass Extremities: No significant cyanosis, clubbing, or edema bilateral lower extremities Skin: Negative rashes, lesions, ulcers Psychiatric:  Negative depression, negative anxiety, negative fatigue, negative mania  Central nervous system:  Cranial nerves II through XII intact, tongue/uvula midline, all extremities muscle strength 5/5, sensation intact throughout, negative dysarthria, negative expressive aphasia, negative receptive aphasia.  .     Data Reviewed: Care during the described time interval was provided by me .  I have  reviewed this patient's available data, including medical history, events of note, physical examination, and all test results as part of my evaluation.   CBC: Recent Labs  Lab 03/04/17 0958 03/05/17 0106  WBC 7.9 7.7  HGB 8.0* 7.9*  HCT 24.0* 24.1*  MCV 97.2 96.8  PLT 83* 189   Basic Metabolic Panel: Recent Labs  Lab 03/04/17 0958 03/04/17 1629 03/04/17 2112 03/05/17 0106  NA 132*  --   --  133*  K 4.3  --   --  4.2  CL 93*  --   --  93*  CO2 22  --   --  25  GLUCOSE 78  --   --  110*  BUN 37*  --   --  40*  CREATININE 14.24*  --   --  15.51*  CALCIUM 6.8*  --   --  7.1*  MG  --   --  2.3  --   PHOS  --  5.0*  --  5.1*   GFR: Estimated Creatinine Clearance: 6.6  mL/min (A) (by C-G formula based on SCr of 15.51 mg/dL (H)). Liver Function Tests: Recent Labs  Lab 03/05/17 0106  AST 39  ALT 17  ALKPHOS 44  BILITOT 0.9  PROT 6.2*  ALBUMIN 3.2*   No results for input(s): LIPASE, AMYLASE in the last 168 hours. No results for input(s): AMMONIA in the last 168 hours. Coagulation Profile: Recent Labs  Lab 03/04/17 2112  INR 1.48   Cardiac Enzymes: Recent Labs  Lab 03/04/17 1629 03/04/17 2112  TROPONINI 5.11* 10.36*   BNP (last 3 results) No results for input(s): PROBNP in the last 8760 hours. HbA1C: No results for input(s): HGBA1C in the last 72 hours. CBG: No results for input(s): GLUCAP in the last 168 hours. Lipid Profile: No results for input(s): CHOL, HDL, LDLCALC, TRIG, CHOLHDL, LDLDIRECT in the last 72 hours. Thyroid Function Tests: No results for input(s): TSH, T4TOTAL, FREET4, T3FREE, THYROIDAB in the last 72 hours. Anemia Panel: Recent Labs    03/05/17 0106  FERRITIN 844*  TIBC 182*  IRON 35*   Urine analysis:    Component Value Date/Time   COLORURINE YELLOW 12/30/2013 1003   APPEARANCEUR HAZY (A) 12/30/2013 1003   LABSPEC 1.017 12/30/2013 1003   PHURINE 8.5 (H) 12/30/2013 1003   GLUCOSEU NEGATIVE 12/30/2013 1003   GLUCOSEU NEGATIVE  11/10/2012 0954   HGBUR SMALL (A) 12/30/2013 1003   BILIRUBINUR NEGATIVE 12/30/2013 1003   KETONESUR NEGATIVE 12/30/2013 1003   PROTEINUR >300 (A) 12/30/2013 1003   UROBILINOGEN 0.2 12/30/2013 1003   NITRITE NEGATIVE 12/30/2013 1003   LEUKOCYTESUR MODERATE (A) 12/30/2013 1003   Sepsis Labs: @LABRCNTIP (procalcitonin:4,lacticidven:4)  )No results found for this or any previous visit (from the past 240 hour(s)).       Radiology Studies: Dg Chest 2 View  Result Date: 03/04/2017 CLINICAL DATA:  Onset of mid sternal chest pain after receiving 1 hour hemodialysis. EXAM: CHEST  2 VIEW COMPARISON:  PA and lateral chest x-ray dated December 29, 2013 FINDINGS: The lungs are adequately inflated. There is no focal infiltrate. The interstitial markings are mildly increased. The cardiac silhouette is enlarged. The central pulmonary vascularity is prominent and less distinct than on the previous study. The patient has undergone previous CABG. The bony thorax exhibits no acute abnormality. IMPRESSION: Findings are consistent with low-grade interstitial edema. Stable cardiomegaly. No alveolar pneumonia. Electronically Signed   By: David  SwazilandJordan M.D.   On: 03/04/2017 09:27        Scheduled Meds: . clopidogrel  75 mg Oral Daily  . darbepoetin (ARANESP) injection - DIALYSIS  100 mcg Intravenous Q Thu-HD  . doxercalciferol  3 mcg Intravenous Once in dialysis  . lanthanum  2,000 mg Oral TID WC  . memantine  10 mg Oral BID  . sodium chloride flush  3 mL Intravenous Q12H  . terazosin  10 mg Oral QHS   Continuous Infusions: . sodium chloride Stopped (03/05/17 0601)  . sodium chloride    . sodium chloride 10 mL/hr at 03/05/17 0601  . argatroban 0.6 mcg/kg/min (03/05/17 0521)     LOS: 1 day    Time spent: 15 minutes    Daelynn Blower, Roselind MessierURTIS J, MD [facility] Pager [phone]   If 7PM-7AM, please contact night-coverage [email or web address] Password [email or web address] 03/05/2017, 7:11 AM

## 2017-03-05 NOTE — Progress Notes (Addendum)
Site area: rt groin fa sheath Site Prior to Removal:  Level 0 Pressure Applied For:  30 minutes Manual:   yes Patient Status During Pull:  stable Post Pull Site:  Level  0  Post Pull Instructions Given:  yes Post Pull Pulses Present: palpable 1+ Dressing Applied:  Gauze and tegaderm Bedrest begins @ 1055 Comments: Patient stated that he had had a previous procedure in his rt groin and that he has some scar tissue there. Lateral and approx 1.5-2 inches  to the rt  the stick site(to the rt of the puncture site) there is an area that is firm and did not change with pressure held there.

## 2017-03-05 NOTE — Consult Note (Signed)
     [facility] CM Primary Care Navigator  03/05/2017  [name] [birth year] [id number]   Went to see patient in the room to identify possible discharge needs but he was in dialysis (HD) per RN report.  Will attempt to meet patient at another time when he is available in the room.     Addendum (03/06/17):   Went back to see patient at the bedside to identify possible discharge needs. Patient reports having"chest pain and difficulty breathing" thathad led to this admission.  PatientendorsesDr. Oliver Barre [facility] the[facility].   [facility] [address] and [facility] obtain medications without difficulty so far.   Patientreportsthat sister [name]) has been managinghis medications at home using "pill box" system filled weekly.  Patient verbalized that he was driving prior to admission to his doctors' appointments when able, but he also uses [facility] transportation to dialysis M-W-F. He reports that his next door neighbor [facility]) can also provide transportation if he needs it.    Patient's sister (lives closeby) will be his primary caregiver when discharged as stated.   Anticipated discharge plan is home according to patient.   Patient voiced understanding to call primary care provider's office when he returns home, for a post discharge follow-up appointment within 1-2 weeks or sooner if needs arise. Patient letter (with PCP's contact number) provided as a reminder.   Patient is 56 y.o.malewith medical history of Seizures, Depression,CVA, HTN, CADS/PCABG in 2013,S/P coronary artery stent placement, diastolic dysfunction, grade 2 by echo, End Stage Renal DiseaseonHemodialysis M-W-F,  HLD,ACHALASIA, Gout,and OSA.  He has diagnosis of chronic diastolic heart failure on this admission.   Patient verbalized lack of knowledge withthesigns and symptoms of HFthatwill need  medical assistance. He states not being aware of HF zones/ tool and has very little awareness of ways to manage heart failure. He reports having a weighing scale but not monitoring his weight at home and unsure of diet restrictions.  Explained to patient about [facility] care management services available for him and was interested with it but prefers phone calls over home visits. Heverbally agreed and opted for referralto [facility] Tele[facility] care management after discharge forfurther assessment of needs and provide information/ education on ways to manage HF.  Referral made to [facility] Telephonic care managementcoordinator forfollow-up of needs and  reinforce disease management of heart failure at home.  [facility] care management information provided for future needs that may arise.   For additional questions please contact:  Karin Golden A. Kiptyn Rafuse, BSN, RN-BC [facility] PRIMARY CARE Navigator Cell: [phone]

## 2017-03-05 NOTE — Progress Notes (Signed)
Reviewed coronary/bypass angio images with Dr. Allyson Sabal and discussed with the patient. While there are many areas of potential ischemia (particularly distal LAD territory), none these have the appearance of an acute athero-thrombotic lesion. Overall little change in anatomy since 2013. The culprit for current events was demand ischemia after missing hemodialysis with subsequent small subendocardial infarction due to increased demand.  None of his coronary lesions require (or are amenable) to PCI. The focus remains on risk factor management and compliance with dietary restrictions and regular dialysis. Due to the high risk for future events, recommend restarting long-term clopidogrel. Reinforced need for compliance with meds. Labs show poor compliance with statin therapy (LDL was 66-89 in 2015, 140-178 in 2018) Ready for DC from cardiac standpoint, once bedrest is complete.  Thurmon Fair, MD, [facility] [phone] office [phone] pager

## 2017-03-05 NOTE — Progress Notes (Signed)
ANTICOAGULATION CONSULT NOTE - Follow Up Consult  Pharmacy Consult for argatroban Indication: suspected HIT  Labs: Recent Labs    03/04/17 0958 03/04/17 1629 03/04/17 1815 03/04/17 2112 03/05/17 0106  HGB 8.0*  --   --   --  7.9*  HCT 24.0*  --   --   --  24.1*  PLT 83*  --   --   --  189  APTT  --   --  35 45* 59*  LABPROT  --   --   --  17.8*  --   INR  --   --   --  1.48  --   CREATININE 14.24*  --   --   --   --   TROPONINI  --  5.11*  --  10.36*  --     Assessment/Plan:  56yo male remains therapeutic on argatroban. Will continue gtt at current rate and confirm stable with q12h PTT.  Vernard GamblesVeronda Jaton Eilers, PharmD, BCPS  03/05/2017,2:20 AM

## 2017-03-05 NOTE — Progress Notes (Signed)
OT Cancellation Note  Patient Details Name: TAJA GABEL MRN: [id number] DOB: [birth year]   Cancelled Treatment:    Reason Eval/Treat Not Completed: Patient at procedure or test/ unavailable(Cath lab, will follow.)  Evern Bio 03/05/2017, 10:12 AM  03/05/2017 Martie Round, OTR/L Pager: [phone]

## 2017-03-05 NOTE — Progress Notes (Signed)
Received report. Pt is back from hemo. He is alert and oriented. Assessment completed.

## 2017-03-06 ENCOUNTER — Inpatient Hospital Stay (HOSPITAL_COMMUNITY): Payer: Medicare Other

## 2017-03-06 DIAGNOSIS — I351 Nonrheumatic aortic (valve) insufficiency: Secondary | ICD-10-CM

## 2017-03-06 DIAGNOSIS — R0789 Other chest pain: Secondary | ICD-10-CM

## 2017-03-06 DIAGNOSIS — Z992 Dependence on renal dialysis: Secondary | ICD-10-CM

## 2017-03-06 DIAGNOSIS — N320 Bladder-neck obstruction: Secondary | ICD-10-CM

## 2017-03-06 DIAGNOSIS — D696 Thrombocytopenia, unspecified: Secondary | ICD-10-CM

## 2017-03-06 DIAGNOSIS — Z8673 Personal history of transient ischemic attack (TIA), and cerebral infarction without residual deficits: Secondary | ICD-10-CM

## 2017-03-06 LAB — RENAL FUNCTION PANEL
Albumin: 3.1 g/dL — ABNORMAL LOW (ref 3.5–5.0)
Anion gap: 11 (ref 5–15)
BUN: 20 mg/dL (ref 6–20)
CO2: 27 mmol/L (ref 22–32)
Calcium: 7.9 mg/dL — ABNORMAL LOW (ref 8.9–10.3)
Chloride: 96 mmol/L — ABNORMAL LOW (ref 101–111)
Creatinine, Ser: 10.06 mg/dL — ABNORMAL HIGH (ref 0.61–1.24)
GFR calc Af Amer: 6 mL/min — ABNORMAL LOW (ref >60)
GFR calc non Af Amer: 5 mL/min — ABNORMAL LOW (ref >60)
Glucose, Bld: 98 mg/dL (ref 65–99)
Phosphorus: 4 mg/dL (ref 2.5–4.6)
Potassium: 4 mmol/L (ref 3.5–5.1)
Sodium: 134 mmol/L — ABNORMAL LOW (ref 135–145)

## 2017-03-06 LAB — CBC
HCT: 20.8 % — ABNORMAL LOW (ref 39.0–52.0)
Hemoglobin: 6.9 g/dL — CL (ref 13.0–17.0)
MCH: 32.1 pg (ref 26.0–34.0)
MCHC: 33.2 g/dL (ref 30.0–36.0)
MCV: 96.7 fL (ref 78.0–100.0)
Platelets: 160 K/uL (ref 150–400)
RBC: 2.15 MIL/uL — ABNORMAL LOW (ref 4.22–5.81)
RDW: 13.6 % (ref 11.5–15.5)
WBC: 6.1 K/uL (ref 4.0–10.5)

## 2017-03-06 LAB — ECHOCARDIOGRAM COMPLETE
HEIGHTINCHES: 72 in
WEIGHTICAEL: 3463.87 [oz_av]

## 2017-03-06 LAB — PREPARE RBC (CROSSMATCH)

## 2017-03-06 LAB — MRSA PCR SCREENING: MRSA BY PCR: NEGATIVE

## 2017-03-06 MED ORDER — HEPARIN SODIUM (PORCINE) 1000 UNIT/ML DIALYSIS: 20.0000 [IU]/kg | INTRAMUSCULAR | Status: DC | PRN

## 2017-03-06 MED ORDER — SODIUM CHLORIDE 0.9 % IV SOLN: Freq: Once | INTRAVENOUS | Status: DC

## 2017-03-06 MED ORDER — LIDOCAINE HCL (PF) 1 % IJ SOLN: 5.0000 mL | INTRAMUSCULAR | Status: DC | PRN

## 2017-03-06 MED ORDER — SODIUM CHLORIDE 0.9 % IV SOLN: 125.0000 mg | INTRAVENOUS | Status: DC

## 2017-03-06 MED ORDER — LIDOCAINE-PRILOCAINE 2.5-2.5 % EX CREA: 1.0000 "application " | TOPICAL_CREAM | CUTANEOUS | Status: DC | PRN

## 2017-03-06 MED ORDER — HEPARIN SODIUM (PORCINE) 1000 UNIT/ML DIALYSIS: 1000.0000 [IU] | INTRAMUSCULAR | Status: DC | PRN

## 2017-03-06 MED ORDER — DOXERCALCIFEROL 4 MCG/2ML IV SOLN: 5.0000 ug | INTRAVENOUS | Status: AC

## 2017-03-06 MED ORDER — ALTEPLASE 2 MG IJ SOLR: 2.0000 mg | Freq: Once | INTRAMUSCULAR | Status: DC | PRN

## 2017-03-06 MED ORDER — SODIUM CHLORIDE 0.9 % IV SOLN: 100.0000 mL | INTRAVENOUS | Status: DC | PRN

## 2017-03-06 MED ORDER — PERFLUTREN LIPID MICROSPHERE
1.0000 mL | INTRAVENOUS | Status: AC | PRN
  Administered 2017-03-06: 2 mL via INTRAVENOUS
  Filled 2017-03-06: qty 10

## 2017-03-06 MED ORDER — PENTAFLUOROPROP-TETRAFLUOROETH EX AERO: 1.0000 "application " | INHALATION_SPRAY | CUTANEOUS | Status: DC | PRN

## 2017-03-06 MED FILL — Heparin Sodium (Porcine) 2 Unit/ML in Sodium Chloride 0.9%: INTRAMUSCULAR | Qty: 1000 | Status: AC

## 2017-03-06 NOTE — Progress Notes (Signed)
  Echocardiogram 2D Echocardiogram has been performed.  [name] 03/06/2017, 3:33 PM

## 2017-03-06 NOTE — Procedures (Signed)
I was present at this dialysis session. I have reviewed the session itself and made appropriate changes.   Results of cardiac cath reveiwed, medical management  Hb 6.9 this AM, further reduced; given CAD and NSTEMI will transfuse 2u PRBC. NOt overtly Fe def but some IV Fe likely beneficial.  He denies overt losses including BRBPR, melena, hematemisis, epistaxis.    Rec aranesp yesterday,  and IV Fe as well.  He has not rec recent ESA at outpatient HD, so this could be a lack of ESA effect.    Weight 100kg this AM, his EDW, cont to probe down further.    Holding Hepairn.  PLT count stable.    UF goal 2L.     Filed Weights   03/05/17 1725 03/06/17 0441 03/06/17 0820  Weight: 99.4 kg (219 lb 2.2 oz) 100.2 kg (220 lb 14.4 oz) 100 kg (220 lb 7.4 oz)    Recent Labs  Lab 03/06/17 0727  NA 134*  K 4.0  CL 96*  CO2 27  GLUCOSE 98  BUN 20  CREATININE 10.06*  CALCIUM 7.9*  PHOS 4.0    Recent Labs  Lab 03/04/17 0958 03/05/17 0106 03/06/17 0727  WBC 7.9 7.7 6.1  HGB 8.0* 7.9* 6.9*  HCT 24.0* 24.1* 20.8*  MCV 97.2 96.8 96.7  PLT 83* 189 160    Scheduled Meds: . [MAR Hold] aspirin  81 mg Oral Daily  . [MAR Hold] clopidogrel  75 mg Oral Q breakfast  . [MAR Hold] doxercalciferol  5 mcg Intravenous Q M,W,F-HD  . [MAR Hold] fondaparinux (ARIXTRA) injection  2.5 mg Subcutaneous QHS  . [MAR Hold] lanthanum  2,000 mg Oral TID WC  . [MAR Hold] memantine  10 mg Oral BID  . [MAR Hold] multivitamin  1 tablet Oral QHS  . [MAR Hold] sodium chloride flush  3 mL Intravenous Q12H  . [MAR Hold] terazosin  10 mg Oral QHS   Continuous Infusions: . [MAR Hold] sodium chloride    . [MAR Hold] sodium chloride    . [MAR Hold] sodium chloride    . [MAR Hold] sodium chloride    . [MAR Hold] ferric gluconate (FERRLECIT/NULECIT) IV     PRN Meds:.[MAR Hold] sodium chloride, [MAR Hold] sodium chloride, [MAR Hold] sodium chloride, [MAR Hold] acetaminophen, [MAR Hold] ALPRAZolam, [MAR Hold]  alteplase, [MAR Hold] hydrALAZINE, [MAR Hold] lidocaine (PF), [MAR Hold] lidocaine-prilocaine, [MAR Hold]  morphine injection, [MAR Hold] ondansetron (ZOFRAN) IV, [MAR Hold] pentafluoroprop-tetrafluoroeth, [MAR Hold] sodium chloride flush   Gregory Heck  MD 03/06/2017, 8:51 AM

## 2017-03-06 NOTE — Progress Notes (Signed)
Pt refused cpap tonight. RT advised to call if he changed his mind.

## 2017-03-06 NOTE — Progress Notes (Signed)
Discharge order obtained.  IV removed intact, telemetry monitor removed.  Reviewed AVS with patient/family, including medications, activity/restrictions, follow-up appointments.  Patient and family verbalized understanding.  Questions asked and answered.  Belongings given to family/patient.  Copy of AVS signature form placed in chart.   

## 2017-03-06 NOTE — Progress Notes (Signed)
CRITICAL VALUE ALERT  Critical Value:  Hemoglobin 6.9  Date & Time Notied:  03/06/2017 0804  Provider Notified: Dr. Sharon Seller at [phone]  Orders Received/Actions taken: Also notified Dialysis RN, as patient is on his way to Dialysis.

## 2017-03-06 NOTE — Progress Notes (Signed)
OT Cancellation Note  Patient Details Name: [name] MRN: [id number] DOB: Aug 22, 1960   Cancelled Treatment:    Reason Eval/Treat Not Completed: OT screened, no needs identified, will sign off  [facility], OTR/L  [phone] 03/06/2017 03/06/2017, 3:47 PM

## 2017-03-06 NOTE — Discharge Summary (Addendum)
DISCHARGE SUMMARY  [name]  MR#: [id number]  DOB:1960-11-10  Date of Admission: 03/04/2017 Date of Discharge: 03/06/2017  Attending Physician:[name]  Patient's EHM:CNOB, Gregory Blalock, MD  Consults: Nephrology [facility] Cardiology   Disposition: D/C home   Follow-up Appts: Follow-up Information    [facility] Follow up on 03/19/2017.   Specialty:  Cardiology Why:  Hospital Follow-Up with Dr. Hazle Glenn Nurse Practitioner on 03/19/2017 at 10:30AM.  Contact information: [address] [place] [address] [phone]       Gregory Levins, MD. Schedule an appointment as soon as possible for a visit in 2 week(s).   Specialties:  Internal Medicine, Radiology Contact information: [address] Maggie Schwalbe [facility] [place] [address] [phone]           Tests Needing Follow-up: -assess Hgb -assess BP control   Discharge Diagnoses: Chest pain syndrome - demand ischemia  CAD Chronic diastolic CHF Essential Hypertension ESRD on HD M/W/F  Anemia of chronic kidney disease Dementia Anxiety History of urinary retention and prostate enlargement History of CVA, remote, OSA  Initial presentation: 56 y.o.M w/ a Hx Seizures, Depression,CVA, HTN, CAD S/P CABG in 2013, S/P coronary artery stent placement, PTCA/DES Resolute to mid-LAD via the LIMA graft, and PTCA of apical 95% stenosis 07/05/11, ESRDonHD M/W/F, HLD, ACHALASIA, Gout, and OSA who presented to the emergency department with chest pain.   Hospital Course:  Chest pain syndrome / CAD(HEART score 7-8.) -s/p CABG in 2013, last PCI 2015 and s/p MI  -12/20 S/P cardiac cath -Cards commented as follows: "While there are many areas of potential ischemia (particularly distal LAD territory), none these have the appearance of an acute athero-thrombotic lesion. Overall little change in anatomy since 2013. The culprit for current events was demand ischemia after missing  hemodialysis with subsequent small subendocardial infarction due to increased demand.  None of his coronary lesions require (or are amenable) to PCI. The focus remains on risk factor management and compliance with dietary restrictions and regular dialysis. Due to the high risk for future events, recommend restarting long-term clopidogrel. Reinforced need for compliance with meds. Labs show poor compliance with statin therapy (LDL was 66-89 in 2015, 140-178 in 2018) Ready for DC from cardiac standpoint, once bedrest is complete"  Chronic diastolic CHF Volume management per HD - no gross volume overload at time of d/c    Essential HypertensionBP   Encouraged strict adherence to medication regimen and HD   ESRD on HD M/W/F  HD per Nephrology  Anemia of chronic kidney disease No gross evidence of blood loss during this admit - given CAD pt was transfused 2U PRBC during this admit when his hgb dropped to 6.9 - will be followed at HD early next week - w/ no evidence of actual bleeding further w/u was not required as intpt   Dementia Continue Namenda - was alert and oriented at time of d/c   History of urinary retention and prostate enlargement continue Hytrin - f/u w/ Urology as an outpt as required   History of CVA, remote  OSA Continue CPAP nightly  Allergies as of 03/06/2017      Reactions   Shrimp [shellfish Allergy] Shortness Of Breath   Atorvastatin Other (See Comments)   weakness   Insulins Other (See Comments)   Patient unsure as to the kind of insulin but states that it has previously caused seizures.  Most recent hospitalization (Jan 2013) received insulin but did not have reaction. LIKELY  DUE TO SIGNIFICANT HYPOGLYCEMIA   Simvastatin Other (See Comments)    abnormal liver tests   Ultram [tramadol] Other (See Comments)   Liver enzyme       Medication List    STOP taking these medications   cinacalcet 90 MG tablet Commonly known as:  SENSIPAR   urea 40 %  ointment Commonly known as:  GORDONS UREA   zolpidem 10 MG tablet Commonly known as:  AMBIEN     TAKE these medications   amLODipine 5 MG tablet Commonly known as:  NORVASC TAKE ONE TABLET BY MOUTH ONE TIME DAILY   aspirin 81 MG EC tablet Take 1 tablet (81 mg total) by mouth daily.   atorvastatin 40 MG tablet Commonly known as:  LIPITOR Take 1 tablet (40 mg total) by mouth daily.   cloNIDine 0.2 MG tablet Commonly known as:  CATAPRES TAKE ONE TABLET BY MOUTH TWICE DAILY   clopidogrel 75 MG tablet Commonly known as:  PLAVIX Take 1 tablet (75 mg total) by mouth daily.   doxercalciferol 4 MCG/2ML injection Commonly known as:  HECTOROL Inject 2.5 mLs (5 mcg total) into the vein every Monday, Wednesday, and Friday with hemodialysis.   febuxostat 40 MG tablet Commonly known as:  ULORIC Take 40 mg by mouth daily as needed (gout).   ferric gluconate 125 mg in sodium chloride 0.9 % 100 mL Inject 125 mg into the vein every Monday, Wednesday, and Friday with hemodialysis.   ketoconazole 2 % cream Commonly known as:  NIZORAL Apply 1 application topically daily.   lanthanum 1000 MG chewable tablet Commonly known as:  [name] Chew 2 tablets (2,000 mg total) by mouth 3 (three) times daily with meals. Yearly physical due in July must see MD for refills   lidocaine-prilocaine cream Commonly known as:  EMLA Apply 1 application topically as needed. Apply as needed for dialysis site   memantine 10 MG tablet Commonly known as:  NAMENDA Take 1 tablet (10 mg total) by mouth 2 (two) times daily.   metoprolol tartrate 50 MG tablet Commonly known as:  LOPRESSOR Take 1 tablet (50 mg total) by mouth 2 (two) times daily.   NITROSTAT 0.4 MG SL tablet Generic drug:  nitroGLYCERIN PLACE 1 TABLET UNDER TONGUE AS NEEDED FOR CHEST PAIN EVERY 5 MINUTES (MAX 3 DOSES)   terazosin 10 MG capsule Commonly known as:  HYTRIN TAKE 1 CAPSULE BY MOUTH DAILY AT BEDTIME       Day of  Discharge BP (!) 155/90 (BP Location: Right Arm)   Pulse 99   Temp 98.4 F (36.9 C) (Oral)   Resp 19   Ht 6' (1.829 m)   Wt 98.2 kg (216 lb 7.9 oz)   SpO2 100%   BMI 29.36 kg/m   Physical Exam: General: No acute respiratory distress Lungs: Clear to auscultation bilaterally  Cardiovascular: Regular rate and rhythm  Abdomen: Nontender, nondistended, soft, bowel sounds positive Extremities: No significant edema bilateral lower extremities  Basic Metabolic Panel: Recent Labs  Lab 03/04/17 0958 03/04/17 1629 03/04/17 2112 03/05/17 0106 03/06/17 0727  NA 132*  --   --  133* 134*  K 4.3  --   --  4.2 4.0  CL 93*  --   --  93* 96*  CO2 22  --   --  25 27  GLUCOSE 78  --   --  110* 98  BUN 37*  --   --  40* 20  CREATININE 14.24*  --   --  15.51* 10.06*  CALCIUM 6.8*  --   --  7.1* 7.9*  MG  --   --  2.3  --   --   PHOS  --  5.0*  --  5.1* 4.0    Liver Function Tests: Recent Labs  Lab 03/05/17 0106 03/06/17 0727  AST 39  --   ALT 17  --   ALKPHOS 44  --   BILITOT 0.9  --   PROT 6.2*  --   ALBUMIN 3.2* 3.1*    Coags: Recent Labs  Lab 03/04/17 2112  INR 1.48    CBC: Recent Labs  Lab 03/04/17 0958 03/05/17 0106 03/06/17 0727  WBC 7.9 7.7 6.1  HGB 8.0* 7.9* 6.9*  HCT 24.0* 24.1* 20.8*  MCV 97.2 96.8 96.7  PLT 83* 189 160    Cardiac Enzymes: Recent Labs  Lab 03/04/17 1629 03/04/17 2112  TROPONINI 5.11* 10.36*   BNP (last 3 results) Recent Labs    03/04/17 0958  BNP 960.1*    Recent Results (from the past 240 hour(s))  MRSA PCR Screening     Status: None   Collection Time: 03/06/17  3:38 AM  Result Value Ref Range Status   MRSA by PCR NEGATIVE NEGATIVE Final    Comment:        The GeneXpert MRSA Assay (FDA approved for NASAL specimens only), is one component of a comprehensive MRSA colonization surveillance program. It is not intended to diagnose MRSA infection nor to guide or monitor treatment for MRSA infections.     Time spent  in discharge (includes decision making & examination of pt): 30 minutes  03/06/2017, 2:36 PM   Lonia BloodJeffrey T. Almas Rake, MD [facility] Office  [phone] Pager [phone]  On-Call/Text Page:      [email or web address]      password [email or web address]

## 2017-03-06 NOTE — Discharge Instructions (Signed)
Dialysis Dialysis is a procedure that replaces some of the work healthy kidneys do. It is done when you lose about 85-90% of your kidney function. It may also be done earlier if your symptoms may be improved by dialysis. During dialysis, wastes, salt, and extra water are removed from the blood, and the levels of certain chemicals in the blood (such as potassium) are maintained. Dialysis is done in sessions. Dialysis sessions are continued until the kidneys get better. If the kidneys cannot get better, such as in end-stage kidney disease, dialysis is continued for life or until you receive a new kidney (kidney transplant). There are two types of dialysis: hemodialysis and peritoneal dialysis. What is hemodialysis? Hemodialysis is a type of dialysis in which a machine called a dialyzer is used to filter the blood. Before beginning hemodialysis, you will have surgery to create a site where blood can be removed from the body and returned to the body (vascular access). There are three types of vascular accesses:  Arteriovenous fistula. To create this type of access, an artery is connected to a vein (usually in the arm). A fistula takes 1-6 months to develop after surgery. If it develops properly, it usually lasts longer than the other types of vascular accesses. It is also less likely to become infected and cause blood clots.  Arteriovenous graft. To create this type of access, an artery and a vein in the arm are connected with a tube. A graft may be used within 2-3 weeks of surgery.  A venous catheter. To create this type of access, a thin, flexible tube (catheter) is placed in a large vein in your neck, chest, or groin. A catheter may be used right away. It is usually used as a temporary access when dialysis needs to begin immediately.  During hemodialysis, blood leaves the body through your access. It travels through a tube to the dialyzer, where it is filtered. The blood then returns to your body through  another tube. Hemodialysis is usually performed by a health care provider at a hospital or dialysis center three times a week. Visits last about 3-4 hours. It may also be performed with the help of another person at home with training. What is peritoneal dialysis? Peritoneal dialysis is a type of dialysis in which the thin lining of the abdomen (peritoneum) is used as a filter. Before beginning peritoneal dialysis, you will have surgery to place a catheter in your abdomen. The catheter will be used to transfer a fluid called dialysate to and from your abdomen. At the start of a session, your abdomen is filled with dialysate. During the session, wastes, salt, and extra water in the blood pass through the peritoneum and into the dialysate. The dialysate is drained from the body at the end of the session. The process of filling and draining the dialysate is called an exchange. Exchanges are repeated until you have used up all the dialysate for the day. Peritoneal dialysis may be performed by you at home or at almost any other location. It is done every day. You may need up to five exchanges a day. The amount of time the dialysate is in your body between exchanges is called a dwell. The dwell depends on the number of exchanges needed and the characteristics of the peritoneum. It usually varies from 1.5-3 hours. You may go about your day normally between exchanges. Alternately, the exchanges may be done at night while you sleep, using a machine called a cycler. Which type of  dialysis should I choose? Both hemodialysis and peritoneal dialysis have advantages and disadvantages. Talk to your health care provider about which type of dialysis would be best for you. Your lifestyle and preferences should be considered along with your medical condition. In some cases, only one type of dialysis may be an option. Advantages of hemodialysis  It is done less often than peritoneal dialysis.  Someone else can do the  dialysis for you.  If you go to a dialysis center, your health care provider will be able to recognize any problems right away.  If you go to a dialysis center, you can interact with others who are having dialysis. This can provide you with emotional support.  Disadvantages of hemodialysis  Hemodialysis may cause cramps and low blood pressure. It may leave you feeling tired on the days you have the treatment.  If you go to a dialysis center, you will need to make weekly appointments and work around the centers schedule.  You will need to take extra care when traveling. If you go to a dialysis center, you will need to make special arrangements to visit a dialysis center near your destination. If you are having treatments at home, you will need to take the dialyzer with you to your destination.  You will need to avoid more foods than you would need to avoid on peritoneal dialysis.  Advantages of peritoneal dialysis  It is less likely than hemodialysis to cause cramps and low blood pressure.  You may do exchanges on your own wherever you are, including when you travel.  You do not need to avoid as many foods as you do on hemodialysis.  Disadvantages of peritoneal dialysis  It is done more often than hemodialysis.  Performing peritoneal dialysis requires you to have dexterity of the hands. You must also be able to lift bags.  You will have to learn sterilization techniques. You will need to practice them every day to reduce the risk of infection.  What changes will I need to make to my diet during dialysis? Both hemodialysis and peritoneal dialysis require you to make some changes to your diet. For example, you will need to limit your intake of foods high in the minerals phosphorus and potassium. You will also need to limit your fluid intake. Your dietitian can help you plan meals. A good meal plan can improve your dialysis and your health. What should I expect when beginning  dialysis? Adjusting to the dialysis treatment, schedule, and diet can take some time. You may need to stop working and may not be able to do some of the things you normally do. You may feel anxious or depressed when beginning dialysis. Eventually, many people feel better overall because of dialysis. Some people are able to return to work after making some changes, such as reducing work intensity. Where can I find more information?  National Kidney Foundation: www.kidney.org  American Association of Kidney Patients: ResidentialShow.is  American Kidney Fund: www.kidneyfund.org This information is not intended to replace advice given to you by your health care provider. Make sure you discuss any questions you have with your health care provider. Document Released: 05/24/2002 Document Revised: 08/09/2015 Document Reviewed: 04/27/2012 Elsevier Interactive Patient Education  2017 Elsevier Inc.   Nonspecific Chest Pain Chest pain can be caused by many different conditions. There is a chance that your pain could be related to something serious, such as a heart attack or a blood clot in your lungs. Chest pain can also be caused  by conditions that are not life-threatening. If you have chest pain, it is very important to follow up with your doctor. Follow these instructions at home: Medicines  If you were prescribed an antibiotic medicine, take it as told by your doctor. Do not stop taking the antibiotic even if you start to feel better.  Take over-the-counter and prescription medicines only as told by your doctor. Lifestyle  Do not use any products that contain nicotine or tobacco, such as cigarettes and e-cigarettes. If you need help quitting, ask your doctor.  Do not drink alcohol.  Make lifestyle changes as told by your doctor. These may include: ? Getting regular exercise. Ask your doctor for some activities that are safe for you. ? Eating a heart-healthy diet. A diet specialist (dietitian) can  help you to learn healthy eating options. ? Staying at a healthy weight. ? Managing diabetes, if needed. ? Lowering your stress, as with deep breathing or spending time in nature. General instructions  Avoid any activities that make you feel chest pain.  If your chest pain is because of heartburn: ? Raise (elevate) the head of your bed about 6 inches (15 cm). You can do this by putting blocks under the bed legs at the head of the bed. ? Do not sleep with extra pillows under your head. That does not help heartburn.  Keep all follow-up visits as told by your doctor. This is important. This includes any further testing if your chest pain does not go away. Contact a doctor if:  Your chest pain does not go away.  You have a rash with blisters on your chest.  You have a fever.  You have chills. Get help right away if:  Your chest pain is worse.  You have a cough that gets worse, or you cough up blood.  You have very bad (severe) pain in your belly (abdomen).  You are very weak.  You pass out (faint).  You have either of these for no clear reason: ? Sudden chest discomfort. ? Sudden discomfort in your arms, back, neck, or jaw.  You have shortness of breath at any time.  You suddenly start to sweat, or your skin gets clammy.  You feel sick to your stomach (nauseous).  You throw up (vomit).  You suddenly feel light-headed or dizzy.  Your heart starts to beat fast, or it feels like it is skipping beats. These symptoms may be an emergency. Do not wait to see if the symptoms will go away. Get medical help right away. Call your local emergency services (911 in the [place] Do not drive yourself to the hospital. This information is not intended to replace advice given to you by your health care provider. Make sure you discuss any questions you have with your health care provider. Document Released: 08/20/2007 Document Revised: 11/26/2015 Document Reviewed: 11/26/2015 Elsevier  Interactive Patient Education  2017 [employer]

## 2017-03-06 NOTE — Evaluation (Signed)
Physical Therapy Evaluation Patient Details Name: [name] MRN: [id number] DOB: 05/18/1960 Today's Date: 03/06/2017   History of Present Illness  pt is a 56 y/o male with pmh significant for stroke, CAD s/p PTCA/stenting, NSTEMI, HTN, CHF, admitted with CP and SOB associated with nausea.  Clinical Impression  Pt is at or close to baseline functioning and should be safe at home . There are no further acute PT needs.  Will sign off at this time.     Follow Up Recommendations No PT follow up    Equipment Recommendations  None recommended by PT    Recommendations for Other Services       Precautions / Restrictions Precautions Precautions: None      Mobility  Bed Mobility Overal bed mobility: Independent                Transfers Overall transfer level: Needs assistance   Transfers: Sit to/from Stand Sit to Stand: Supervision;Modified independent (Device/Increase time)         General transfer comment: supervision only due to significant postural orthostatics today.  Ambulation/Gait Ambulation/Gait assistance: Supervision;Modified independent (Device/Increase time) Ambulation Distance (Feet): 250 Feet Assistive device: Rolling walker (2 wheeled) Gait Pattern/deviations: Step-through pattern   Gait velocity interpretation: at or above normal speed for age/gender General Gait Details: steady with normalized gait in spite of pt's neuropathy.  Stairs Stairs: Yes Stairs assistance: Modified independent (Device/Increase time) Stair Management: One rail Left;Alternating pattern;Forwards Number of Stairs: 3 General stair comments: steady with rail  Wheelchair Mobility    Modified Rankin (Stroke Patients Only)       Balance Overall balance assessment: Needs assistance   Sitting balance-Leahy Scale: Normal     Standing balance support: No upper extremity supported Standing balance-Leahy Scale: Good                                Pertinent Vitals/Pain Pain Assessment: No/denies pain    Home Living Family/patient expects to be discharged to:: Private residence Living Arrangements: Alone Available Help at Discharge: Available PRN/intermittently;Family Type of Home: House       Home Layout: One level Home Equipment: Cane - single point      Prior Function Level of Independence: Independent               Hand Dominance        Extremity/Trunk Assessment   Upper Extremity Assessment Upper Extremity Assessment: Overall WFL for tasks assessed    Lower Extremity Assessment Lower Extremity Assessment: Overall WFL for tasks assessed       Communication   Communication: No difficulties  Cognition Arousal/Alertness: Awake/alert Behavior During Therapy: WFL for tasks assessed/performed Overall Cognitive Status: Within Functional Limits for tasks assessed                                        General Comments     Exercises     Assessment/Plan    PT Assessment Patent does not need any further PT services  PT Problem List         PT Treatment Interventions      PT Goals (Current goals can be found in the Care Plan section)  Acute Rehab PT Goals PT Goal Formulation: All assessment and education complete, DC therapy    Frequency     Barriers to discharge  Co-evaluation               AM-PAC PT "6 Clicks" Daily Activity  Outcome Measure Difficulty turning over in bed (including adjusting bedclothes, sheets and blankets)?: None Difficulty moving from lying on back to sitting on the side of the bed? : None Difficulty sitting down on and standing up from a chair with arms (e.g., wheelchair, bedside commode, etc,.)?: None Help needed moving to and from a bed to chair (including a wheelchair)?: None Help needed walking in hospital room?: None Help needed climbing 3-5 steps with a railing? : None 6 Click Score: 24    End of Session   Activity  Tolerance: Patient tolerated treatment well Patient left: in chair;with call bell/phone within reach Nurse Communication: Mobility status PT Visit Diagnosis: Other abnormalities of gait and mobility (R26.89)    Time: 1410-1430 PT Time Calculation (min) (ACUTE ONLY): 20 min   Charges:   PT Evaluation $PT Eval Moderate Complexity: 1 Mod     PT G Codes:        2017/03/23  [place] Bing, PT [phone]  (pager)  Eliseo Gum Dosia Yodice 2017/03/23, 4:25 PM

## 2017-03-06 NOTE — Progress Notes (Signed)
OT Cancellation    03/06/17 0900  OT Visit Information  Last OT Received On 03/06/17  Reason Eval/Treat Not Completed Patient at procedure or test/ unavailable (HD)  Will attempt later if able.  [facility], OT/L  [phone] 03/06/2017

## 2017-03-07 LAB — BPAM RBC
Blood Product Expiration Date: 201812282359
Blood Product Expiration Date: 201901092359
ISSUE DATE / TIME: 201812210957
ISSUE DATE / TIME: 201812210957
UNIT TYPE AND RH: 7300
Unit Type and Rh: 7300

## 2017-03-07 LAB — TYPE AND SCREEN
ABO/RH(D): B POS
Antibody Screen: NEGATIVE
UNIT DIVISION: 0
UNIT DIVISION: 0

## 2017-03-07 LAB — HEPARIN INDUCED PLATELET AB (HIT ANTIBODY): HEPARIN INDUCED PLT AB: 0.407 {OD_unit} — AB (ref 0.000–0.400)

## 2017-03-09 ENCOUNTER — Other Ambulatory Visit: Payer: Self-pay | Admitting: *Deleted

## 2017-03-09 NOTE — Patient Outreach (Signed)
[facility]) Care Management  03/09/2017  MARCKUS STEFANOVICH [birth year] [id number]  Received referral via Hospital Liaison for disease management/recent hospital stay with diagnosis-Chronic diastolic heart failure.[facility] stay from 12/19-12/21/2018;  Transition of care will be completed by primary care provider office who will refer to [facility] care management if needed.   Telephone call to patient -unable to leave message on primary contact; left message requesting call back on 2nd contact number.  Plan:  Will follow up.  Colleen Can, RN BSN CCM Care Management Coordinator [facility] Care Management  [phone]

## 2017-03-11 DIAGNOSIS — N2581 Secondary hyperparathyroidism of renal origin: Secondary | ICD-10-CM | POA: Diagnosis not present

## 2017-03-11 DIAGNOSIS — E875 Hyperkalemia: Secondary | ICD-10-CM | POA: Diagnosis not present

## 2017-03-11 DIAGNOSIS — N186 End stage renal disease: Secondary | ICD-10-CM | POA: Diagnosis not present

## 2017-03-11 DIAGNOSIS — D631 Anemia in chronic kidney disease: Secondary | ICD-10-CM | POA: Diagnosis not present

## 2017-03-11 DIAGNOSIS — Z992 Dependence on renal dialysis: Secondary | ICD-10-CM | POA: Diagnosis not present

## 2017-03-13 ENCOUNTER — Other Ambulatory Visit: Payer: Self-pay | Admitting: *Deleted

## 2017-03-13 DIAGNOSIS — N186 End stage renal disease: Secondary | ICD-10-CM | POA: Diagnosis not present

## 2017-03-13 DIAGNOSIS — Z992 Dependence on renal dialysis: Secondary | ICD-10-CM | POA: Diagnosis not present

## 2017-03-13 DIAGNOSIS — E875 Hyperkalemia: Secondary | ICD-10-CM | POA: Diagnosis not present

## 2017-03-13 DIAGNOSIS — D631 Anemia in chronic kidney disease: Secondary | ICD-10-CM | POA: Diagnosis not present

## 2017-03-13 DIAGNOSIS — N2581 Secondary hyperparathyroidism of renal origin: Secondary | ICD-10-CM | POA: Diagnosis not present

## 2017-03-13 NOTE — Patient Outreach (Signed)
[facility]) Care Management  03/13/2017  [name] 1960-04-15 [id number]  Received referral via Hospital Liaison for disease management/recent hospital stay with diagnosis-Chronic diastolic heart failure.[facility] Hospital stay from 12/19-12/21/2018;  Transition of care will be completed by primary care provider office who will refer to [facility] care management if needed.   Telephone call x 2; unable to leave voice mail.  Pl;an: Will follow up.  Colleen Can, RN BSN CCM Care Management Coordinator [facility] Care Management  [phone]

## 2017-03-15 DIAGNOSIS — N2581 Secondary hyperparathyroidism of renal origin: Secondary | ICD-10-CM | POA: Diagnosis not present

## 2017-03-15 DIAGNOSIS — Z992 Dependence on renal dialysis: Secondary | ICD-10-CM | POA: Diagnosis not present

## 2017-03-15 DIAGNOSIS — E875 Hyperkalemia: Secondary | ICD-10-CM | POA: Diagnosis not present

## 2017-03-15 DIAGNOSIS — N186 End stage renal disease: Secondary | ICD-10-CM | POA: Diagnosis not present

## 2017-03-15 DIAGNOSIS — D631 Anemia in chronic kidney disease: Secondary | ICD-10-CM | POA: Diagnosis not present

## 2017-03-16 ENCOUNTER — Other Ambulatory Visit: Payer: Self-pay | Admitting: *Deleted

## 2017-03-16 DIAGNOSIS — I120 Hypertensive chronic kidney disease with stage 5 chronic kidney disease or end stage renal disease: Secondary | ICD-10-CM | POA: Diagnosis not present

## 2017-03-16 DIAGNOSIS — N186 End stage renal disease: Secondary | ICD-10-CM | POA: Diagnosis not present

## 2017-03-16 DIAGNOSIS — Z992 Dependence on renal dialysis: Secondary | ICD-10-CM | POA: Diagnosis not present

## 2017-03-16 NOTE — Patient Outreach (Signed)
[facility] Care Management  03/16/2017  Eulis CannerMichael B Schweikert 07/02/1960 [id number]   Received referral via Hospital Liaison for disease management/recent hospital stay with diagnosis-Chronic diastolic heart failure.Marland Kitchen. Hospital stay from 12/19-12/21/2018;  Transition of care will be completed by primary care provider office who will refer [facility] if needed. Liaison referred due patient has knowledge deficit regarding self care of HF.   Outreach call x 3 to patient. Patient answered call & gave HIPPA  verification.  [facility].   Patient voices that he was in hospital recently & had stroke and has had strokes in the past. States he gets dialysis on Monday, Wednesday & Friday. Voices he lives alone & uses cane to assist with walking.   States he uses [facility] to get to MD appointments. Voices he has all of medicines & takes as directed.    Patient voices he [facility] & consents to services.  Recommendation for Health Coach for disease management.  Plan: Refer to care management assistant to assign to RN Health Coach for disease management.   Colleen CanLinda Manreet Kiernan, RN BSN CCM Care Management Coordinator [facility] Care [facility]  [phone]

## 2017-03-18 DIAGNOSIS — N186 End stage renal disease: Secondary | ICD-10-CM | POA: Diagnosis not present

## 2017-03-18 DIAGNOSIS — D509 Iron deficiency anemia, unspecified: Secondary | ICD-10-CM | POA: Diagnosis not present

## 2017-03-18 DIAGNOSIS — Z992 Dependence on renal dialysis: Secondary | ICD-10-CM | POA: Diagnosis not present

## 2017-03-18 DIAGNOSIS — N2581 Secondary hyperparathyroidism of renal origin: Secondary | ICD-10-CM | POA: Diagnosis not present

## 2017-03-18 DIAGNOSIS — D631 Anemia in chronic kidney disease: Secondary | ICD-10-CM | POA: Diagnosis not present

## 2017-03-18 NOTE — Progress Notes (Signed)
Cardiology Office Note   Date:  03/19/2017   ID:  [name], DOB 26-Dec-1960, MRN [id number]  PCP:  Corwin Levins, MD  Cardiologist:  Nanetta Batty MD  Chief Complaint  Patient presents with  . Follow-up    Left heart cath F/U.  Marland Kitchen Edema    Left leg.     History of Present Illness: [name] is a 57 y.o. male who presents for ongoing minimum coronary artery disease, history of CABG in 2013, PCI in 2015 status post MI, with post hospital follow-up.  He was recently admitted with recurrent chest pain requiring cardiac catheterization.  Conclusion     Ost 1st Mrg to 1st Mrg lesion is 80% stenosed.  1st Mrg lesion is 95% stenosed.  Post Atrio lesion is 100% stenosed.  Prox LAD lesion is 100% stenosed.  Mid LAD lesion is 25% stenosed.  Dist LAD lesion is 95% stenosed.  There is moderate left ventricular systolic dysfunction.  LV end diastolic pressure is moderately elevated.  The left ventricular ejection fraction is 35-45% by visual estimate.   He was counseled on medical compliance provided with Plavix, and statin therapy.  States that he has taken his medications daily since discharge.  He offers no new complaints of chest discomfort or weakness on the left.   Past Medical History:  Diagnosis Date  . ACHALASIA   . ANEMIA-NOS   . CAD (coronary artery disease), 3 vessel significant disease.    . CEREBROVASCULAR ACCIDENT, HX OF   . CHF (congestive heart failure) (HCC)   . CKD (chronic kidney disease) stage 4, GFR 15-29 ml/min (HCC)   . Constipation   . DEGENERATIVE JOINT DISEASE, SHOULDER    knee  . DEPRESSION    2006- not depressed any longer  . Family history of adverse reaction to anesthesia    Mother, hard to awaken  . GERD    no meds required  . Gout    takes Uloric daily  . H/O hiatal hernia   . Headache(784.0)   . Heart attack (HCC)   . History of colon polyps   . History of diastolic dysfunction, grade 2 by echo   . HYPERLIPIDEMIA    takes crestor daily  . Hyperlipidemia   . HYPERTENSION    takes Norvasc,Catapress,Hydralazine,Imdur,and Metoprolol daily  . Impaired glucose tolerance   . Insomnia    takes Ambien nightly  . NSTEMI (non-ST elevated myocardial infarction), 06/30/11 07/01/2011  . RENAL CALCULUS, HX OF   . RENAL INSUFFICIENCY    12/2013- TThSat  . S/P coronary artery stent placement, PTCA/DES Resolute to mid-LAD via the LIMA graft, and PTCA of apical 95% stenosis 07/05/11 07/04/2011  . S/P PTCA (percutaneous transluminal coronary angioplasty), 06/30/11 07/01/2011  . Seizures (HCC)    INSULIN INDUCED  . Sleep apnea    SLEEP STUDY IN [place] , NO MACHINE YET  1 YEAR  . Stroke (HCC)    93/2005/2006/2007;left sided weakness.  1993 12/2013  . Thyroid disease     Past Surgical History:  Procedure Laterality Date  . ARCH AORTOGRAM N/A 02/19/2012   Procedure: ARCH AORTOGRAM;  Surgeon: Fransisco Hertz, MD;  Location: [facility] CATH LAB;  Service: Cardiovascular;  Laterality: N/A;  . AV FISTULA PLACEMENT  12/02/2011   Procedure: ARTERIOVENOUS (AV) FISTULA CREATION;  Surgeon: Fransisco Hertz, MD;  Location: [facility] OR;  Service: Vascular;  Laterality: Left;  Ultrasound Guided  . AV FISTULA PLACEMENT Left 06/08/2012   Procedure: ARTERIOVENOUS (AV) FISTULA CREATION;  Surgeon: Fransisco Hertz, MD;  Location: [facility] OR;  Service: Vascular;  Laterality: Left;  . BASCILIC VEIN TRANSPOSITION Left 09/06/2014   Procedure: FIRST STAGE BASILIC VEIN TRANSPOSITION ;  Surgeon: Fransisco Hertz, MD;  Location: [facility] OR;  Service: Vascular;  Laterality: Left;  . BASCILIC VEIN TRANSPOSITION Left 12/06/2014   Procedure: SECOND STAGE BRACHIAL VEIN TRANSPOSITION ;  Surgeon: Fransisco Hertz, MD;  Location: [facility] OR;  Service: Vascular;  Laterality: Left;  . CARDIAC CATHETERIZATION    . COLONOSCOPY    . CORONARY ANGIOPLASTY     06/30/11   AT [facility]  . CORONARY ARTERY BYPASS GRAFT  04/01/2011   Procedure: CORONARY ARTERY BYPASS GRAFTING (CABG);  Surgeon: Kathlee Nations Suann Larry, MD;   Location: [facility] OR;  Service: Open Heart Surgery;  Laterality: N/A;  . HERNIA REPAIR  2013  . INSERTION OF DIALYSIS CATHETER  04/01/2011   Procedure: INSERTION OF DIALYSIS CATHETER;  Surgeon: Nilda Simmer, MD;  Location: [facility] OR;  Service: Vascular;  Laterality: N/A;  . KNEE SURGERY Right 01/07/2011   arthroscopy  . LEFT HEART CATH AND CORS/GRAFTS ANGIOGRAPHY N/A 03/05/2017   Procedure: LEFT HEART CATH AND CORS/GRAFTS ANGIOGRAPHY;  Surgeon: Runell Gess, MD;  Location: [facility] INVASIVE CV LAB;  Service: Cardiovascular;  Laterality: N/A;  . LEFT HEART CATHETERIZATION WITH CORONARY ANGIOGRAM N/A 03/28/2011   Procedure: LEFT HEART CATHETERIZATION WITH CORONARY ANGIOGRAM;  Surgeon: Lennette Bihari, MD;  Location: [facility] CATH LAB;  Service: Cardiovascular;  Laterality: N/A;  pending creatnine  . LEFT HEART CATHETERIZATION WITH CORONARY/GRAFT ANGIOGRAM N/A 06/30/2011   Procedure: LEFT HEART CATHETERIZATION WITH Isabel Caprice;  Surgeon: Thurmon Fair, MD;  Location: [facility] CATH LAB;  Service: Cardiovascular;  Laterality: N/A;  . LIGATION OF ARTERIOVENOUS  FISTULA Left 06/08/2012   Procedure: LIGATION OF ARTERIOVENOUS  FISTULA;  Surgeon: Fransisco Hertz, MD;  Location: [facility] OR;  Service: Vascular;  Laterality: Left;  . NM MYOCAR PERF EJECTION FRACTION  06/25/2011   There is evidence of mild ischemia in the basal inferolateral and mid inferolateral regions. (Extent 7 %) The post-stress ejection fraction is 44%. There is mild hypocontractility in the distal inferoapical segment. baseline T wave inversion is noted in leads I, avL, II, V2. This is a low risk scan. The present perfusion study suggests significant myocardial salvage with mild residual ischemia .  Marland Kitchen PERCUTANEOUS CORONARY INTERVENTION-BALLOON ONLY  06/30/2011   Procedure: PERCUTANEOUS CORONARY INTERVENTION-BALLOON ONLY;  Surgeon: Thurmon Fair, MD;  Location: [facility] CATH LAB;  Service: Cardiovascular;;  . PERCUTANEOUS CORONARY STENT INTERVENTION (PCI-S)  N/A 07/04/2011   Procedure: PERCUTANEOUS CORONARY STENT INTERVENTION (PCI-S);  Surgeon: Lennette Bihari, MD;  Location: [facility] CATH LAB;  Service: Cardiovascular;  Laterality: N/A;  . REVISION OF ARTERIOVENOUS GORETEX GRAFT Left 12/06/2014   Procedure: REVISION OF ARTERIOVENOUS GORETEX GRAFT USING 94mmx10cm;  Surgeon: Fransisco Hertz, MD;  Location: [facility] OR;  Service: Vascular;  Laterality: Left;  . STOMACH SURGERY  2009   achalasia  . SUBCLAVIAN STENT PLACEMENT  12/20/2011  . TONSILLECTOMY    . TONSILLECTOMY       Current Outpatient Medications  Medication Sig Dispense Refill  . amLODipine (NORVASC) 5 MG tablet TAKE ONE TABLET BY MOUTH ONE TIME DAILY  30 tablet 11  . aspirin EC 81 MG EC tablet Take 1 tablet (81 mg total) by mouth daily.    . cloNIDine (CATAPRES) 0.2 MG tablet Take 1 tablet (0.2 mg total) by mouth 2 (two) times daily. 30 tablet 3  .  clopidogrel (PLAVIX) 75 MG tablet Take 1 tablet (75 mg total) by mouth daily. 90 tablet 4  . doxercalciferol (HECTOROL) 4 MCG/2ML injection Inject 2.5 mLs (5 mcg total) into the vein every Monday, Wednesday, and Friday with hemodialysis. 2 mL   . febuxostat (ULORIC) 40 MG tablet Take 40 mg by mouth daily as needed (gout).     . ferric gluconate 125 mg in sodium chloride 0.9 % 100 mL Inject 125 mg into the vein every Monday, Wednesday, and Friday with hemodialysis.    Marland Kitchen ketoconazole (NIZORAL) 2 % cream Apply 1 application topically daily. 60 g 2  . lanthanum ([name]) 1000 MG chewable tablet Chew 2 tablets (2,000 mg total) by mouth 3 (three) times daily with meals. Yearly physical due in July must see MD for refills 540 tablet 0  . lidocaine-prilocaine (EMLA) cream Apply 1 application topically as needed. Apply as needed for dialysis site    . memantine (NAMENDA) 10 MG tablet Take 1 tablet (10 mg total) by mouth 2 (two) times daily. 60 tablet 11  . metoprolol tartrate (LOPRESSOR) 50 MG tablet Take 1 tablet (50 mg total) by mouth 2 (two) times daily. 180  tablet 3  . NITROSTAT 0.4 MG SL tablet PLACE 1 TABLET UNDER TONGUE AS NEEDED FOR CHEST PAIN EVERY 5 MINUTES (MAX 3 DOSES) 25 tablet 4  . terazosin (HYTRIN) 10 MG capsule TAKE 1 CAPSULE BY MOUTH DAILY AT BEDTIME 90 capsule 2  . atorvastatin (LIPITOR) 40 MG tablet Take 1 tablet (40 mg total) by mouth daily. 90 tablet 3   No current facility-administered medications for this visit.     Allergies:   Shrimp [shellfish allergy]; Atorvastatin; Insulins; Simvastatin; and Ultram [tramadol]    Social History:  The patient  reports that  has never smoked. He quit smokeless tobacco use about 14 years ago. His smokeless tobacco use included chew. He reports that he drinks about 1.2 oz of alcohol per week. He reports that he does not use drugs.   Family History:  The patient's family history includes Alzheimer's disease in his mother; Cancer in his father and mother; Heart disease in his father and mother; Hyperlipidemia in his mother and other; Hypertension in his mother and other; Stroke in his other.    ROS: All other systems are reviewed and negative. Unless otherwise mentioned in H&P    PHYSICAL EXAM: VS:  BP 124/76   Pulse 86   Ht 6' (1.829 m)   Wt 219 lb (99.3 kg)   BMI 29.70 kg/m  , BMI Body mass index is 29.7 kg/m. GEN: Well nourished, well developed, in no acute distress  HEENT: normal  Neck: no JVD, carotid bruits, or masses Cardiac: RRR; 1/6 systolic murmur, no rubs, or gallops,no edema  Respiratory:  clear to auscultation bilaterally, normal work of breathing GI: soft, nontender, nondistended, + BS MS: no deformity or atrophy  Skin: warm and dry, no rash Neuro:  Strength and sensation are diminished on the left.  Psych: euthymic mood, full affect  Recent Labs: 11/04/2016: TSH 0.58 03/04/2017: B Natriuretic Peptide 960.1; Magnesium 2.3 03/05/2017: ALT 17 03/06/2017: BUN 20; Creatinine, Ser 10.06; Hemoglobin 6.9; Platelets 160; Potassium 4.0; Sodium 134    Lipid Panel      Component Value Date/Time   CHOL 236 (H) 11/04/2016 1151   TRIG 287.0 (H) 11/04/2016 1151   HDL 37.70 (L) 11/04/2016 1151   CHOLHDL 6 11/04/2016 1151   VLDL 57.4 (H) 11/04/2016 1151   LDLCALC 89 12/30/2013  0530   LDLDIRECT 140.0 11/04/2016 1151      Wt Readings from Last 3 Encounters:  03/19/17 219 lb (99.3 kg)  04/02/2017 216 lb 7.9 oz (98.2 kg)  11/04/16 226 lb (102.5 kg)      Other studies Reviewed: Echocardiogram 2017/04/02 Left ventricle: The cavity size was normal. Wall thickness was   increased in a pattern of moderate LVH. Systolic function was   moderately reduced. The estimated ejection fraction was in the   range of 35% to 40%. There is akinesis of the anterolateral and   apical myocardium. - Aortic valve: There was mild regurgitation. - Aorta: Aortic root dimension: 40 mm (ED). - Ascending aorta: The ascending aorta was mildly dilated. - Mitral valve: There was moderate regurgitation.   ASSESSMENT AND PLAN:  1. CAD: Status post hospitalization he is being treated medically.  He has been restarted on clopidogrel 75 mg daily, metoprolol 50 mg daily.  States that he has been compliant with medication.  Follow-up with him in 3 months.  2.  Hypertension: Patient is continued on amlodipine, clonidine, along with cardiac meds.  Blood pressure is well controlled currently.  No changes  3.  Hypercholesterolemia: Continue statin therapy as directed.  Follow-up lipids and LFTs in 3 months.  4. Chronic Systolic Dysfunction: No evidence of decompensation.   Current medicines are reviewed at length with the patient today.    Labs/ tests ordered today include: Lipids and LFT's  Bettey Mare. Liborio Nixon, ANP, AACC   03/19/2017 12:31 PM    [facility] [address] [place], [place] [address] Phone: [phone]; Fax: [phone]

## 2017-03-19 ENCOUNTER — Ambulatory Visit (INDEPENDENT_AMBULATORY_CARE_PROVIDER_SITE_OTHER): Payer: Medicare Other | Admitting: Adult Health

## 2017-03-19 ENCOUNTER — Encounter: Payer: Self-pay | Admitting: Adult Health

## 2017-03-19 VITALS — BP 124/76 | HR 86 | Ht 72.0 in | Wt 219.0 lb

## 2017-03-19 DIAGNOSIS — I10 Essential (primary) hypertension: Secondary | ICD-10-CM | POA: Diagnosis not present

## 2017-03-19 DIAGNOSIS — I43 Cardiomyopathy in diseases classified elsewhere: Secondary | ICD-10-CM

## 2017-03-19 DIAGNOSIS — Z79899 Other long term (current) drug therapy: Secondary | ICD-10-CM

## 2017-03-19 DIAGNOSIS — I2510 Atherosclerotic heart disease of native coronary artery without angina pectoris: Secondary | ICD-10-CM | POA: Diagnosis not present

## 2017-03-19 MED ORDER — METOPROLOL TARTRATE 50 MG PO TABS: 50.0000 mg | ORAL_TABLET | Freq: Two times a day (BID) | ORAL | 3 refills | Status: DC

## 2017-03-19 MED ORDER — CLOPIDOGREL BISULFATE 75 MG PO TABS: 75.0000 mg | ORAL_TABLET | Freq: Every day | ORAL | 4 refills | Status: DC

## 2017-03-19 MED ORDER — CLONIDINE HCL 0.2 MG PO TABS: 0.2000 mg | ORAL_TABLET | Freq: Two times a day (BID) | ORAL | 3 refills | Status: DC

## 2017-03-19 NOTE — Patient Instructions (Addendum)
Medication Instructions:  NO CHANGES-Your physician recommends that you continue on your current medications as directed. Please refer to the Current Medication list given to you today.  If you need a refill on your cardiac medications before your next appointment, please call your pharmacy.  Labwork: FLP AND LFT TODAY HERE IN OUR OFFICE AT [facility]  Special Instructions: DISCUSS REFILL FOR NAMENDA WITH YOUR PCP  Follow-Up: Your physician wants you to follow-up in: 6 MONTHS WITH DR Allyson Sabal -OR- Joni Reining, DNP.   Thank you for choosing [facility] at [facility]!!

## 2017-03-20 DIAGNOSIS — D509 Iron deficiency anemia, unspecified: Secondary | ICD-10-CM | POA: Diagnosis not present

## 2017-03-20 DIAGNOSIS — N186 End stage renal disease: Secondary | ICD-10-CM | POA: Diagnosis not present

## 2017-03-20 DIAGNOSIS — D631 Anemia in chronic kidney disease: Secondary | ICD-10-CM | POA: Diagnosis not present

## 2017-03-20 DIAGNOSIS — Z992 Dependence on renal dialysis: Secondary | ICD-10-CM | POA: Diagnosis not present

## 2017-03-20 DIAGNOSIS — N2581 Secondary hyperparathyroidism of renal origin: Secondary | ICD-10-CM | POA: Diagnosis not present

## 2017-03-20 LAB — HEPATIC FUNCTION PANEL
ALBUMIN: 4.4 g/dL (ref 3.5–5.5)
ALT: 11 IU/L (ref 0–44)
AST: 16 IU/L (ref 0–40)
Alkaline Phosphatase: 50 IU/L (ref 39–117)
Bilirubin Total: 0.6 mg/dL (ref 0.0–1.2)
Bilirubin, Direct: 0.19 mg/dL (ref 0.00–0.40)
Total Protein: 7 g/dL (ref 6.0–8.5)

## 2017-03-20 LAB — LIPID PANEL
CHOL/HDL RATIO: 5.2 ratio — AB (ref 0.0–5.0)
Cholesterol, Total: 208 mg/dL — ABNORMAL HIGH (ref 100–199)
HDL: 40 mg/dL (ref >39)
LDL CALC: 139 mg/dL — AB (ref 0–99)
TRIGLYCERIDES: 145 mg/dL (ref 0–149)
VLDL Cholesterol Cal: 29 mg/dL (ref 5–40)

## 2017-03-23 ENCOUNTER — Other Ambulatory Visit: Payer: Self-pay

## 2017-03-23 DIAGNOSIS — Z79899 Other long term (current) drug therapy: Secondary | ICD-10-CM

## 2017-03-23 DIAGNOSIS — N2581 Secondary hyperparathyroidism of renal origin: Secondary | ICD-10-CM | POA: Diagnosis not present

## 2017-03-23 DIAGNOSIS — D631 Anemia in chronic kidney disease: Secondary | ICD-10-CM | POA: Diagnosis not present

## 2017-03-23 DIAGNOSIS — N186 End stage renal disease: Secondary | ICD-10-CM | POA: Diagnosis not present

## 2017-03-23 DIAGNOSIS — D509 Iron deficiency anemia, unspecified: Secondary | ICD-10-CM | POA: Diagnosis not present

## 2017-03-23 DIAGNOSIS — E7800 Pure hypercholesterolemia, unspecified: Secondary | ICD-10-CM

## 2017-03-23 DIAGNOSIS — Z992 Dependence on renal dialysis: Secondary | ICD-10-CM | POA: Diagnosis not present

## 2017-03-25 DIAGNOSIS — D509 Iron deficiency anemia, unspecified: Secondary | ICD-10-CM | POA: Diagnosis not present

## 2017-03-25 DIAGNOSIS — N186 End stage renal disease: Secondary | ICD-10-CM | POA: Diagnosis not present

## 2017-03-25 DIAGNOSIS — N2581 Secondary hyperparathyroidism of renal origin: Secondary | ICD-10-CM | POA: Diagnosis not present

## 2017-03-25 DIAGNOSIS — D631 Anemia in chronic kidney disease: Secondary | ICD-10-CM | POA: Diagnosis not present

## 2017-03-25 DIAGNOSIS — Z992 Dependence on renal dialysis: Secondary | ICD-10-CM | POA: Diagnosis not present

## 2017-03-27 ENCOUNTER — Encounter: Payer: Self-pay | Admitting: *Deleted

## 2017-03-27 DIAGNOSIS — D509 Iron deficiency anemia, unspecified: Secondary | ICD-10-CM | POA: Diagnosis not present

## 2017-03-27 DIAGNOSIS — D631 Anemia in chronic kidney disease: Secondary | ICD-10-CM | POA: Diagnosis not present

## 2017-03-27 DIAGNOSIS — N2581 Secondary hyperparathyroidism of renal origin: Secondary | ICD-10-CM | POA: Diagnosis not present

## 2017-03-27 DIAGNOSIS — Z992 Dependence on renal dialysis: Secondary | ICD-10-CM | POA: Diagnosis not present

## 2017-03-27 DIAGNOSIS — N186 End stage renal disease: Secondary | ICD-10-CM | POA: Diagnosis not present

## 2017-03-30 DIAGNOSIS — D631 Anemia in chronic kidney disease: Secondary | ICD-10-CM | POA: Diagnosis not present

## 2017-03-30 DIAGNOSIS — N2581 Secondary hyperparathyroidism of renal origin: Secondary | ICD-10-CM | POA: Diagnosis not present

## 2017-03-30 DIAGNOSIS — N186 End stage renal disease: Secondary | ICD-10-CM | POA: Diagnosis not present

## 2017-03-30 DIAGNOSIS — Z992 Dependence on renal dialysis: Secondary | ICD-10-CM | POA: Diagnosis not present

## 2017-03-30 DIAGNOSIS — D509 Iron deficiency anemia, unspecified: Secondary | ICD-10-CM | POA: Diagnosis not present

## 2017-04-01 DIAGNOSIS — N2581 Secondary hyperparathyroidism of renal origin: Secondary | ICD-10-CM | POA: Diagnosis not present

## 2017-04-01 DIAGNOSIS — N186 End stage renal disease: Secondary | ICD-10-CM | POA: Diagnosis not present

## 2017-04-01 DIAGNOSIS — D509 Iron deficiency anemia, unspecified: Secondary | ICD-10-CM | POA: Diagnosis not present

## 2017-04-01 DIAGNOSIS — Z992 Dependence on renal dialysis: Secondary | ICD-10-CM | POA: Diagnosis not present

## 2017-04-01 DIAGNOSIS — D631 Anemia in chronic kidney disease: Secondary | ICD-10-CM | POA: Diagnosis not present

## 2017-04-03 DIAGNOSIS — Z992 Dependence on renal dialysis: Secondary | ICD-10-CM | POA: Diagnosis not present

## 2017-04-03 DIAGNOSIS — N186 End stage renal disease: Secondary | ICD-10-CM | POA: Diagnosis not present

## 2017-04-03 DIAGNOSIS — D509 Iron deficiency anemia, unspecified: Secondary | ICD-10-CM | POA: Diagnosis not present

## 2017-04-03 DIAGNOSIS — N2581 Secondary hyperparathyroidism of renal origin: Secondary | ICD-10-CM | POA: Diagnosis not present

## 2017-04-03 DIAGNOSIS — D631 Anemia in chronic kidney disease: Secondary | ICD-10-CM | POA: Diagnosis not present

## 2017-04-06 DIAGNOSIS — D631 Anemia in chronic kidney disease: Secondary | ICD-10-CM | POA: Diagnosis not present

## 2017-04-06 DIAGNOSIS — N186 End stage renal disease: Secondary | ICD-10-CM | POA: Diagnosis not present

## 2017-04-06 DIAGNOSIS — N2581 Secondary hyperparathyroidism of renal origin: Secondary | ICD-10-CM | POA: Diagnosis not present

## 2017-04-06 DIAGNOSIS — Z992 Dependence on renal dialysis: Secondary | ICD-10-CM | POA: Diagnosis not present

## 2017-04-06 DIAGNOSIS — D509 Iron deficiency anemia, unspecified: Secondary | ICD-10-CM | POA: Diagnosis not present

## 2017-04-08 DIAGNOSIS — N2581 Secondary hyperparathyroidism of renal origin: Secondary | ICD-10-CM | POA: Diagnosis not present

## 2017-04-08 DIAGNOSIS — N186 End stage renal disease: Secondary | ICD-10-CM | POA: Diagnosis not present

## 2017-04-08 DIAGNOSIS — Z992 Dependence on renal dialysis: Secondary | ICD-10-CM | POA: Diagnosis not present

## 2017-04-08 DIAGNOSIS — D509 Iron deficiency anemia, unspecified: Secondary | ICD-10-CM | POA: Diagnosis not present

## 2017-04-08 DIAGNOSIS — D631 Anemia in chronic kidney disease: Secondary | ICD-10-CM | POA: Diagnosis not present

## 2017-04-09 ENCOUNTER — Other Ambulatory Visit: Payer: Self-pay | Admitting: *Deleted

## 2017-04-09 NOTE — Patient Outreach (Signed)
[facility]) Care Management  04/09/2017  [name] [birth year] [id number]   RN Health Coach attempted#1  follow up outreach call to patient.  Patient was unavailable. HIPPA compliance voicemail message left with return callback number.  Plan: RN will call patient again within 14 days.  Gean Maidens BSN RN [facility] Care Management [phone]

## 2017-04-10 DIAGNOSIS — N2581 Secondary hyperparathyroidism of renal origin: Secondary | ICD-10-CM | POA: Diagnosis not present

## 2017-04-10 DIAGNOSIS — D509 Iron deficiency anemia, unspecified: Secondary | ICD-10-CM | POA: Diagnosis not present

## 2017-04-10 DIAGNOSIS — D631 Anemia in chronic kidney disease: Secondary | ICD-10-CM | POA: Diagnosis not present

## 2017-04-10 DIAGNOSIS — N186 End stage renal disease: Secondary | ICD-10-CM | POA: Diagnosis not present

## 2017-04-10 DIAGNOSIS — Z992 Dependence on renal dialysis: Secondary | ICD-10-CM | POA: Diagnosis not present

## 2017-04-13 ENCOUNTER — Other Ambulatory Visit: Payer: Self-pay | Admitting: Internal Medicine

## 2017-04-13 DIAGNOSIS — N186 End stage renal disease: Secondary | ICD-10-CM | POA: Diagnosis not present

## 2017-04-13 DIAGNOSIS — D509 Iron deficiency anemia, unspecified: Secondary | ICD-10-CM | POA: Diagnosis not present

## 2017-04-13 DIAGNOSIS — Z992 Dependence on renal dialysis: Secondary | ICD-10-CM | POA: Diagnosis not present

## 2017-04-13 DIAGNOSIS — N2581 Secondary hyperparathyroidism of renal origin: Secondary | ICD-10-CM | POA: Diagnosis not present

## 2017-04-13 DIAGNOSIS — D631 Anemia in chronic kidney disease: Secondary | ICD-10-CM | POA: Diagnosis not present

## 2017-04-13 NOTE — Telephone Encounter (Signed)
Done erx 

## 2017-04-15 DIAGNOSIS — D631 Anemia in chronic kidney disease: Secondary | ICD-10-CM | POA: Diagnosis not present

## 2017-04-15 DIAGNOSIS — Z992 Dependence on renal dialysis: Secondary | ICD-10-CM | POA: Diagnosis not present

## 2017-04-15 DIAGNOSIS — N186 End stage renal disease: Secondary | ICD-10-CM | POA: Diagnosis not present

## 2017-04-15 DIAGNOSIS — D509 Iron deficiency anemia, unspecified: Secondary | ICD-10-CM | POA: Diagnosis not present

## 2017-04-15 DIAGNOSIS — N2581 Secondary hyperparathyroidism of renal origin: Secondary | ICD-10-CM | POA: Diagnosis not present

## 2017-04-16 ENCOUNTER — Other Ambulatory Visit: Payer: Self-pay | Admitting: *Deleted

## 2017-04-16 DIAGNOSIS — I120 Hypertensive chronic kidney disease with stage 5 chronic kidney disease or end stage renal disease: Secondary | ICD-10-CM | POA: Diagnosis not present

## 2017-04-16 DIAGNOSIS — Z992 Dependence on renal dialysis: Secondary | ICD-10-CM | POA: Diagnosis not present

## 2017-04-16 DIAGNOSIS — N186 End stage renal disease: Secondary | ICD-10-CM | POA: Diagnosis not present

## 2017-04-16 NOTE — Patient Outreach (Signed)
[facility]) Care Management  04/16/2017  MALIEK SCHELLHORN Sep 11, 1960 [id number]   RN Health Coach attempted #2 follow up outreach call to patient.  Patient was unavailable. No  voicemail message left. Mailbox is full Plan: RN will call patient again within 14 days.  Gean Maidens BSN RN [facility] Care Management [phone]

## 2017-04-17 DIAGNOSIS — N186 End stage renal disease: Secondary | ICD-10-CM | POA: Diagnosis not present

## 2017-04-17 DIAGNOSIS — Z992 Dependence on renal dialysis: Secondary | ICD-10-CM | POA: Diagnosis not present

## 2017-04-17 DIAGNOSIS — D631 Anemia in chronic kidney disease: Secondary | ICD-10-CM | POA: Diagnosis not present

## 2017-04-17 DIAGNOSIS — D509 Iron deficiency anemia, unspecified: Secondary | ICD-10-CM | POA: Diagnosis not present

## 2017-04-17 DIAGNOSIS — N2581 Secondary hyperparathyroidism of renal origin: Secondary | ICD-10-CM | POA: Diagnosis not present

## 2017-04-17 DIAGNOSIS — I120 Hypertensive chronic kidney disease with stage 5 chronic kidney disease or end stage renal disease: Secondary | ICD-10-CM | POA: Diagnosis not present

## 2017-04-20 DIAGNOSIS — D631 Anemia in chronic kidney disease: Secondary | ICD-10-CM | POA: Diagnosis not present

## 2017-04-20 DIAGNOSIS — D509 Iron deficiency anemia, unspecified: Secondary | ICD-10-CM | POA: Diagnosis not present

## 2017-04-20 DIAGNOSIS — N186 End stage renal disease: Secondary | ICD-10-CM | POA: Diagnosis not present

## 2017-04-20 DIAGNOSIS — N2581 Secondary hyperparathyroidism of renal origin: Secondary | ICD-10-CM | POA: Diagnosis not present

## 2017-04-20 DIAGNOSIS — Z992 Dependence on renal dialysis: Secondary | ICD-10-CM | POA: Diagnosis not present

## 2017-04-22 DIAGNOSIS — Z992 Dependence on renal dialysis: Secondary | ICD-10-CM | POA: Diagnosis not present

## 2017-04-22 DIAGNOSIS — D631 Anemia in chronic kidney disease: Secondary | ICD-10-CM | POA: Diagnosis not present

## 2017-04-22 DIAGNOSIS — D509 Iron deficiency anemia, unspecified: Secondary | ICD-10-CM | POA: Diagnosis not present

## 2017-04-22 DIAGNOSIS — N2581 Secondary hyperparathyroidism of renal origin: Secondary | ICD-10-CM | POA: Diagnosis not present

## 2017-04-22 DIAGNOSIS — N186 End stage renal disease: Secondary | ICD-10-CM | POA: Diagnosis not present

## 2017-04-24 DIAGNOSIS — N186 End stage renal disease: Secondary | ICD-10-CM | POA: Diagnosis not present

## 2017-04-24 DIAGNOSIS — Z992 Dependence on renal dialysis: Secondary | ICD-10-CM | POA: Diagnosis not present

## 2017-04-24 DIAGNOSIS — N2581 Secondary hyperparathyroidism of renal origin: Secondary | ICD-10-CM | POA: Diagnosis not present

## 2017-04-24 DIAGNOSIS — D631 Anemia in chronic kidney disease: Secondary | ICD-10-CM | POA: Diagnosis not present

## 2017-04-24 DIAGNOSIS — D509 Iron deficiency anemia, unspecified: Secondary | ICD-10-CM | POA: Diagnosis not present

## 2017-04-27 DIAGNOSIS — N186 End stage renal disease: Secondary | ICD-10-CM | POA: Diagnosis not present

## 2017-04-27 DIAGNOSIS — D631 Anemia in chronic kidney disease: Secondary | ICD-10-CM | POA: Diagnosis not present

## 2017-04-27 DIAGNOSIS — Z992 Dependence on renal dialysis: Secondary | ICD-10-CM | POA: Diagnosis not present

## 2017-04-27 DIAGNOSIS — N2581 Secondary hyperparathyroidism of renal origin: Secondary | ICD-10-CM | POA: Diagnosis not present

## 2017-04-27 DIAGNOSIS — D509 Iron deficiency anemia, unspecified: Secondary | ICD-10-CM | POA: Diagnosis not present

## 2017-04-29 DIAGNOSIS — Z992 Dependence on renal dialysis: Secondary | ICD-10-CM | POA: Diagnosis not present

## 2017-04-29 DIAGNOSIS — N186 End stage renal disease: Secondary | ICD-10-CM | POA: Diagnosis not present

## 2017-04-29 DIAGNOSIS — N2581 Secondary hyperparathyroidism of renal origin: Secondary | ICD-10-CM | POA: Diagnosis not present

## 2017-04-29 DIAGNOSIS — D509 Iron deficiency anemia, unspecified: Secondary | ICD-10-CM | POA: Diagnosis not present

## 2017-04-29 DIAGNOSIS — D631 Anemia in chronic kidney disease: Secondary | ICD-10-CM | POA: Diagnosis not present

## 2017-04-30 ENCOUNTER — Other Ambulatory Visit: Payer: Self-pay | Admitting: *Deleted

## 2017-04-30 DIAGNOSIS — Z992 Dependence on renal dialysis: Secondary | ICD-10-CM | POA: Diagnosis not present

## 2017-04-30 DIAGNOSIS — T82858A Stenosis of vascular prosthetic devices, implants and grafts, initial encounter: Secondary | ICD-10-CM | POA: Diagnosis not present

## 2017-04-30 DIAGNOSIS — N186 End stage renal disease: Secondary | ICD-10-CM | POA: Diagnosis not present

## 2017-04-30 DIAGNOSIS — I871 Compression of vein: Secondary | ICD-10-CM | POA: Diagnosis not present

## 2017-04-30 NOTE — Patient Outreach (Signed)
[facility]) Care Management  04/30/2017  JAQUAN COLLIVER November 06, 1960 [id number]   RN Health Coach attempted #3 follow up outreach call to patient.  Patient was unavailable. per voicemail the mailbox is full..  Plan: RN sent unsuccessful outreach letter out with closure within 10 business days.  Gean Maidens BSN RN [facility] Care Management [phone]

## 2017-05-01 DIAGNOSIS — N2581 Secondary hyperparathyroidism of renal origin: Secondary | ICD-10-CM | POA: Diagnosis not present

## 2017-05-01 DIAGNOSIS — Z992 Dependence on renal dialysis: Secondary | ICD-10-CM | POA: Diagnosis not present

## 2017-05-01 DIAGNOSIS — D631 Anemia in chronic kidney disease: Secondary | ICD-10-CM | POA: Diagnosis not present

## 2017-05-01 DIAGNOSIS — D509 Iron deficiency anemia, unspecified: Secondary | ICD-10-CM | POA: Diagnosis not present

## 2017-05-01 DIAGNOSIS — N186 End stage renal disease: Secondary | ICD-10-CM | POA: Diagnosis not present

## 2017-05-04 DIAGNOSIS — D631 Anemia in chronic kidney disease: Secondary | ICD-10-CM | POA: Diagnosis not present

## 2017-05-04 DIAGNOSIS — N2581 Secondary hyperparathyroidism of renal origin: Secondary | ICD-10-CM | POA: Diagnosis not present

## 2017-05-04 DIAGNOSIS — N186 End stage renal disease: Secondary | ICD-10-CM | POA: Diagnosis not present

## 2017-05-04 DIAGNOSIS — Z992 Dependence on renal dialysis: Secondary | ICD-10-CM | POA: Diagnosis not present

## 2017-05-04 DIAGNOSIS — D509 Iron deficiency anemia, unspecified: Secondary | ICD-10-CM | POA: Diagnosis not present

## 2017-05-06 DIAGNOSIS — D509 Iron deficiency anemia, unspecified: Secondary | ICD-10-CM | POA: Diagnosis not present

## 2017-05-06 DIAGNOSIS — N2581 Secondary hyperparathyroidism of renal origin: Secondary | ICD-10-CM | POA: Diagnosis not present

## 2017-05-06 DIAGNOSIS — D631 Anemia in chronic kidney disease: Secondary | ICD-10-CM | POA: Diagnosis not present

## 2017-05-06 DIAGNOSIS — N186 End stage renal disease: Secondary | ICD-10-CM | POA: Diagnosis not present

## 2017-05-06 DIAGNOSIS — Z992 Dependence on renal dialysis: Secondary | ICD-10-CM | POA: Diagnosis not present

## 2017-05-07 ENCOUNTER — Ambulatory Visit: Payer: Medicare Other | Admitting: Internal Medicine

## 2017-05-08 DIAGNOSIS — D509 Iron deficiency anemia, unspecified: Secondary | ICD-10-CM | POA: Diagnosis not present

## 2017-05-08 DIAGNOSIS — N186 End stage renal disease: Secondary | ICD-10-CM | POA: Diagnosis not present

## 2017-05-08 DIAGNOSIS — N2581 Secondary hyperparathyroidism of renal origin: Secondary | ICD-10-CM | POA: Diagnosis not present

## 2017-05-08 DIAGNOSIS — D631 Anemia in chronic kidney disease: Secondary | ICD-10-CM | POA: Diagnosis not present

## 2017-05-08 DIAGNOSIS — Z992 Dependence on renal dialysis: Secondary | ICD-10-CM | POA: Diagnosis not present

## 2017-05-12 ENCOUNTER — Ambulatory Visit (INDEPENDENT_AMBULATORY_CARE_PROVIDER_SITE_OTHER): Payer: Medicare Other | Admitting: Internal Medicine

## 2017-05-12 ENCOUNTER — Encounter: Payer: Self-pay | Admitting: Internal Medicine

## 2017-05-12 VITALS — BP 112/78 | HR 48 | Temp 98.3°F | Ht 72.0 in | Wt 208.0 lb

## 2017-05-12 DIAGNOSIS — Z992 Dependence on renal dialysis: Secondary | ICD-10-CM | POA: Diagnosis not present

## 2017-05-12 DIAGNOSIS — N2581 Secondary hyperparathyroidism of renal origin: Secondary | ICD-10-CM | POA: Diagnosis not present

## 2017-05-12 DIAGNOSIS — N186 End stage renal disease: Secondary | ICD-10-CM

## 2017-05-12 DIAGNOSIS — I2510 Atherosclerotic heart disease of native coronary artery without angina pectoris: Secondary | ICD-10-CM | POA: Diagnosis not present

## 2017-05-12 DIAGNOSIS — R7302 Impaired glucose tolerance (oral): Secondary | ICD-10-CM | POA: Diagnosis not present

## 2017-05-12 DIAGNOSIS — I10 Essential (primary) hypertension: Secondary | ICD-10-CM | POA: Diagnosis not present

## 2017-05-12 DIAGNOSIS — D631 Anemia in chronic kidney disease: Secondary | ICD-10-CM | POA: Diagnosis not present

## 2017-05-12 DIAGNOSIS — N320 Bladder-neck obstruction: Secondary | ICD-10-CM | POA: Diagnosis not present

## 2017-05-12 DIAGNOSIS — D509 Iron deficiency anemia, unspecified: Secondary | ICD-10-CM | POA: Diagnosis not present

## 2017-05-12 DIAGNOSIS — I429 Cardiomyopathy, unspecified: Secondary | ICD-10-CM | POA: Diagnosis not present

## 2017-05-12 DIAGNOSIS — E7800 Pure hypercholesterolemia, unspecified: Secondary | ICD-10-CM

## 2017-05-12 HISTORY — DX: Cardiomyopathy, unspecified: I42.9

## 2017-05-12 MED ORDER — TERAZOSIN HCL 10 MG PO CAPS: ORAL_CAPSULE | ORAL | 3 refills | Status: AC

## 2017-05-12 MED ORDER — ZOSTER VAC RECOMB ADJUVANTED 50 MCG/0.5ML IM SUSR: 0.5000 mL | Freq: Once | INTRAMUSCULAR | 1 refills | Status: AC

## 2017-05-12 MED ORDER — NITROGLYCERIN 0.4 MG SL SUBL: SUBLINGUAL_TABLET | SUBLINGUAL | 4 refills | Status: AC

## 2017-05-12 MED ORDER — ATORVASTATIN CALCIUM 40 MG PO TABS: 40.0000 mg | ORAL_TABLET | Freq: Every day | ORAL | 3 refills | Status: DC

## 2017-05-12 MED ORDER — METOPROLOL TARTRATE 50 MG PO TABS: 50.0000 mg | ORAL_TABLET | Freq: Two times a day (BID) | ORAL | 3 refills | Status: DC

## 2017-05-12 MED ORDER — ZOLPIDEM TARTRATE 10 MG PO TABS: 10.0000 mg | ORAL_TABLET | Freq: Every evening | ORAL | 1 refills | Status: DC | PRN

## 2017-05-12 MED ORDER — MEMANTINE HCL 10 MG PO TABS: 10.0000 mg | ORAL_TABLET | Freq: Two times a day (BID) | ORAL | 3 refills | Status: DC

## 2017-05-12 NOTE — Patient Instructions (Signed)
Your Shingles Shot was sent to the pharmacy, but be sure to ask about the cost  OK to restart the Lipitor at 40 mg per day  Please continue all other medications as before, and refills have been done if requested for the other medications  Please have the pharmacy call with any other refills you may need.  Please continue your efforts at being more active, low cholesterol diet, and weight control.  You are otherwise up to date with prevention measures today.  Please keep your appointments with your specialists as you may have planned  Please go to the LAB in the Basement (turn left off the elevator) for the tests to be done today  You will be contacted by phone if any changes need to be made immediately.  Otherwise, you will receive a letter about your results with an explanation, but please check with MyChart first.  Please remember to sign up for MyChart if you have not done so, as this will be important to you in the future with finding out test results, communicating by private email, and scheduling acute appointments online when needed.  Wynelle Link return in 6 months, or sooner if needed

## 2017-05-12 NOTE — Progress Notes (Signed)
Subjective:    Patient ID: [name], male    DOB: [birth year], 57 y.o.   MRN: [id number]  HPI  Here to f/u; overall doing ok,  Pt denies chest pain, increasing sob or doe, wheezing, orthopnea, PND, increased LE swelling, palpitations, dizziness or syncope.  Pt denies new neurological symptoms such as new headache, or facial or extremity weakness or numbness.  Pt denies polydipsia, polyuria, or low sugar episode.  Pt states overall good compliance with meds, mostly trying to follow appropriate diet, with wt overall stable, without regular exercise. Out of lipitor for several wks, asks for refill.  Last hgb on emr is 6.9 now s/p transfusion, due for f/u. Walks with cane, but no falls. Cont's to follow with cards, renal and neurology.   Pt denies fever, wt loss, night sweats, loss of appetite, or other constitutional symptoms  No other interval hx or new complaints Past Medical History:  Diagnosis Date  . ACHALASIA   . ANEMIA-NOS   . CAD (coronary artery disease), 3 vessel significant disease.    . Cardiomyopathy (HCC) 05/12/2017  . CEREBROVASCULAR ACCIDENT, HX OF   . CHF (congestive heart failure) (HCC)   . CKD (chronic kidney disease) stage 4, GFR 15-29 ml/min (HCC)   . Constipation   . DEGENERATIVE JOINT DISEASE, SHOULDER    knee  . DEPRESSION    2006- not depressed any longer  . Family history of adverse reaction to anesthesia    Mother, hard to awaken  . GERD    no meds required  . Gout    takes Uloric daily  . H/O hiatal hernia   . Headache(784.0)   . Heart attack (HCC)   . History of colon polyps   . History of diastolic dysfunction, grade 2 by echo   . HYPERLIPIDEMIA    takes crestor daily  . Hyperlipidemia   . HYPERTENSION    takes Norvasc,Catapress,Hydralazine,Imdur,and Metoprolol daily  . Impaired glucose tolerance   . Insomnia    takes Ambien nightly  . NSTEMI (non-ST elevated myocardial infarction), 06/30/11 07/01/2011  . RENAL CALCULUS, HX OF   . RENAL  INSUFFICIENCY    12/2013- TThSat  . S/P coronary artery stent placement, PTCA/DES Resolute to mid-LAD via the LIMA graft, and PTCA of apical 95% stenosis 07/05/11 07/04/2011  . S/P PTCA (percutaneous transluminal coronary angioplasty), 06/30/11 07/01/2011  . Seizures (HCC)    INSULIN INDUCED  . Sleep apnea    SLEEP STUDY IN [place] , NO MACHINE YET  1 YEAR  . Stroke (HCC)    93/2005/2006/2007;left sided weakness.  1993 12/2013  . Thyroid disease    Past Surgical History:  Procedure Laterality Date  . ARCH AORTOGRAM N/A 02/19/2012   Procedure: ARCH AORTOGRAM;  Surgeon: Fransisco Hertz, MD;  Location: [facility] CATH LAB;  Service: Cardiovascular;  Laterality: N/A;  . AV FISTULA PLACEMENT  12/02/2011   Procedure: ARTERIOVENOUS (AV) FISTULA CREATION;  Surgeon: Fransisco Hertz, MD;  Location: [facility] OR;  Service: Vascular;  Laterality: Left;  Ultrasound Guided  . AV FISTULA PLACEMENT Left 06/08/2012   Procedure: ARTERIOVENOUS (AV) FISTULA CREATION;  Surgeon: Fransisco Hertz, MD;  Location: [facility] OR;  Service: Vascular;  Laterality: Left;  . BASCILIC VEIN TRANSPOSITION Left 09/06/2014   Procedure: FIRST STAGE BASILIC VEIN TRANSPOSITION ;  Surgeon: Fransisco Hertz, MD;  Location: [facility] OR;  Service: Vascular;  Laterality: Left;  . BASCILIC VEIN TRANSPOSITION Left 12/06/2014   Procedure: SECOND STAGE BRACHIAL VEIN TRANSPOSITION ;  Surgeon: Fransisco Hertz, MD;  Location: [facility] OR;  Service: Vascular;  Laterality: Left;  . CARDIAC CATHETERIZATION    . COLONOSCOPY    . CORONARY ANGIOPLASTY     06/30/11   AT [facility]  . CORONARY ARTERY BYPASS GRAFT  04/01/2011   Procedure: CORONARY ARTERY BYPASS GRAFTING (CABG);  Surgeon: Kathlee Nations Suann Larry, MD;  Location: [facility] OR;  Service: Open Heart Surgery;  Laterality: N/A;  . HERNIA REPAIR  2013  . INSERTION OF DIALYSIS CATHETER  04/01/2011   Procedure: INSERTION OF DIALYSIS CATHETER;  Surgeon: Nilda Simmer, MD;  Location: [facility] OR;  Service: Vascular;  Laterality: N/A;  . KNEE SURGERY Right 01/07/2011     arthroscopy  . LEFT HEART CATH AND CORS/GRAFTS ANGIOGRAPHY N/A 03/05/2017   Procedure: LEFT HEART CATH AND CORS/GRAFTS ANGIOGRAPHY;  Surgeon: Runell Gess, MD;  Location: [facility] INVASIVE CV LAB;  Service: Cardiovascular;  Laterality: N/A;  . LEFT HEART CATHETERIZATION WITH CORONARY ANGIOGRAM N/A 03/28/2011   Procedure: LEFT HEART CATHETERIZATION WITH CORONARY ANGIOGRAM;  Surgeon: Lennette Bihari, MD;  Location: [facility] CATH LAB;  Service: Cardiovascular;  Laterality: N/A;  pending creatnine  . LEFT HEART CATHETERIZATION WITH CORONARY/GRAFT ANGIOGRAM N/A 06/30/2011   Procedure: LEFT HEART CATHETERIZATION WITH Isabel Caprice;  Surgeon: Thurmon Fair, MD;  Location: [facility] CATH LAB;  Service: Cardiovascular;  Laterality: N/A;  . LIGATION OF ARTERIOVENOUS  FISTULA Left 06/08/2012   Procedure: LIGATION OF ARTERIOVENOUS  FISTULA;  Surgeon: Fransisco Hertz, MD;  Location: [facility] OR;  Service: Vascular;  Laterality: Left;  . NM MYOCAR PERF EJECTION FRACTION  06/25/2011   There is evidence of mild ischemia in the basal inferolateral and mid inferolateral regions. (Extent 7 %) The post-stress ejection fraction is 44%. There is mild hypocontractility in the distal inferoapical segment. baseline T wave inversion is noted in leads I, avL, II, V2. This is a low risk scan. The present perfusion study suggests significant myocardial salvage with mild residual ischemia .  Marland Kitchen PERCUTANEOUS CORONARY INTERVENTION-BALLOON ONLY  06/30/2011   Procedure: PERCUTANEOUS CORONARY INTERVENTION-BALLOON ONLY;  Surgeon: Thurmon Fair, MD;  Location: [facility] CATH LAB;  Service: Cardiovascular;;  . PERCUTANEOUS CORONARY STENT INTERVENTION (PCI-S) N/A 07/04/2011   Procedure: PERCUTANEOUS CORONARY STENT INTERVENTION (PCI-S);  Surgeon: Lennette Bihari, MD;  Location: [facility] CATH LAB;  Service: Cardiovascular;  Laterality: N/A;  . REVISION OF ARTERIOVENOUS GORETEX GRAFT Left 12/06/2014   Procedure: REVISION OF ARTERIOVENOUS GORETEX GRAFT USING 27mmx10cm;   Surgeon: Fransisco Hertz, MD;  Location: [facility] OR;  Service: Vascular;  Laterality: Left;  . STOMACH SURGERY  2009   achalasia  . SUBCLAVIAN STENT PLACEMENT  12/20/2011  . TONSILLECTOMY    . TONSILLECTOMY      reports that  has never smoked. He quit smokeless tobacco use about 14 years ago. His smokeless tobacco use included chew. He reports that he drinks about 1.2 oz of alcohol per week. He reports that he does not use drugs. family history includes Alzheimer's disease in his mother; Cancer in his father and mother; Heart disease in his father and mother; Hyperlipidemia in his mother and other; Hypertension in his mother and other; Stroke in his other. Allergies  Allergen Reactions  . Shrimp [Shellfish Allergy] Shortness Of Breath  . Atorvastatin Other (See Comments)    weakness  . Insulins Other (See Comments)    Patient unsure as to the kind of insulin but states that it has previously caused seizures.  Most recent hospitalization (Jan 2013) received insulin  but did not have reaction. LIKELY DUE TO SIGNIFICANT HYPOGLYCEMIA  . Simvastatin Other (See Comments)     abnormal liver tests  . Ultram [Tramadol] Other (See Comments)    Liver enzyme    Current Outpatient Medications on File Prior to Visit  Medication Sig Dispense Refill  . amLODipine (NORVASC) 5 MG tablet TAKE ONE TABLET BY MOUTH ONE TIME DAILY  30 tablet 11  . aspirin EC 81 MG EC tablet Take 1 tablet (81 mg total) by mouth daily.    . cloNIDine (CATAPRES) 0.2 MG tablet Take 1 tablet (0.2 mg total) by mouth 2 (two) times daily. 30 tablet 3  . clopidogrel (PLAVIX) 75 MG tablet Take 1 tablet (75 mg total) by mouth daily. 90 tablet 4  . doxercalciferol (HECTOROL) 4 MCG/2ML injection Inject 2.5 mLs (5 mcg total) into the vein every Monday, Wednesday, and Friday with hemodialysis. 2 mL   . febuxostat (ULORIC) 40 MG tablet Take 40 mg by mouth daily as needed (gout).     . ferric gluconate 125 mg in sodium chloride 0.9 % 100 mL Inject 125  mg into the vein every Monday, Wednesday, and Friday with hemodialysis.    Marland Kitchen ketoconazole (NIZORAL) 2 % cream Apply 1 application topically daily. 60 g 2  . lanthanum ([name]) 1000 MG chewable tablet Chew 2 tablets (2,000 mg total) by mouth 3 (three) times daily with meals. Yearly physical due in July must see MD for refills 540 tablet 0  . lidocaine-prilocaine (EMLA) cream Apply 1 application topically as needed. Apply as needed for dialysis site     No current facility-administered medications on file prior to visit.    Review of Systems  Constitutional: Negative for other unusual diaphoresis or sweats HENT: Negative for ear discharge or swelling Eyes: Negative for other worsening visual disturbances Respiratory: Negative for stridor or other swelling  Gastrointestinal: Negative for worsening distension or other blood Genitourinary: Negative for retention or other urinary change Musculoskeletal: Negative for other MSK pain or swelling Skin: Negative for color change or other new lesions Neurological: Negative for worsening tremors and other numbness  Psychiatric/Behavioral: Negative for worsening agitation or other fatigue All other system neg per pt    Objective:   Physical Exam BP 112/78   Pulse (!) 48   Temp 98.3 F (36.8 C) (Oral)   Ht 6' (1.829 m)   Wt 208 lb (94.3 kg)   SpO2 96%   BMI 28.21 kg/m  VS noted,  Constitutional: Pt appears in NAD HENT: Head: NCAT.  Right Ear: External ear normal.  Left Ear: External ear normal.  Eyes: . Pupils are equal, round, and reactive to light. Conjunctivae and EOM are normal Nose: without d/c or deformity Neck: Neck supple. Gross normal ROM Cardiovascular: Normal rate and regular rhythm.   Pulmonary/Chest: Effort normal and breath sounds without rales or wheezing.  Abd:  Soft, NT, ND, + BS, no organomegaly Neurological: Pt is alert. At baseline orientation, motor grossly intact Skin: Skin is warm. No rashes, other new lesions, no  LE edema Psychiatric: Pt behavior is normal without agitation  No other exam findings       Assessment & Plan:

## 2017-05-13 DIAGNOSIS — D509 Iron deficiency anemia, unspecified: Secondary | ICD-10-CM | POA: Diagnosis not present

## 2017-05-13 DIAGNOSIS — Z992 Dependence on renal dialysis: Secondary | ICD-10-CM | POA: Diagnosis not present

## 2017-05-13 DIAGNOSIS — D631 Anemia in chronic kidney disease: Secondary | ICD-10-CM | POA: Diagnosis not present

## 2017-05-13 DIAGNOSIS — N2581 Secondary hyperparathyroidism of renal origin: Secondary | ICD-10-CM | POA: Diagnosis not present

## 2017-05-13 DIAGNOSIS — N186 End stage renal disease: Secondary | ICD-10-CM | POA: Diagnosis not present

## 2017-05-14 NOTE — Assessment & Plan Note (Signed)
Lab Results  Component Value Date   HGBA1C 5.3 11/04/2016  stable overall by history and exam, recent data reviewed with pt, and pt to continue medical treatment as before,  to f/u any worsening symptoms or concerns

## 2017-05-14 NOTE — Assessment & Plan Note (Signed)
stable overall by history and exam, and pt to continue medical treatment as before,  to f/u any worsening symptoms or concerns 

## 2017-05-14 NOTE — Assessment & Plan Note (Signed)
For f/u with labs,  to f/u any worsening symptoms or concerns

## 2017-05-14 NOTE — Assessment & Plan Note (Signed)
stable overall by history and exam, recent data reviewed with pt,  to f/u any worsening symptoms or concerns, for restart lipitor 40 qd

## 2017-05-14 NOTE — Assessment & Plan Note (Signed)
stable overall by history and exam, recent data reviewed with pt, and pt to continue medical treatment as before,  to f/u any worsening symptoms or concerns BP Readings from Last 3 Encounters:  05/12/17 112/78  03/19/17 124/76  03/06/17 (!) 155/90

## 2017-05-14 NOTE — Assessment & Plan Note (Signed)
Also for psa as he is due 

## 2017-05-15 ENCOUNTER — Encounter: Payer: Self-pay | Admitting: *Deleted

## 2017-05-15 ENCOUNTER — Other Ambulatory Visit: Payer: Self-pay | Admitting: *Deleted

## 2017-05-15 DIAGNOSIS — N2581 Secondary hyperparathyroidism of renal origin: Secondary | ICD-10-CM | POA: Diagnosis not present

## 2017-05-15 DIAGNOSIS — I120 Hypertensive chronic kidney disease with stage 5 chronic kidney disease or end stage renal disease: Secondary | ICD-10-CM | POA: Diagnosis not present

## 2017-05-15 DIAGNOSIS — D509 Iron deficiency anemia, unspecified: Secondary | ICD-10-CM | POA: Diagnosis not present

## 2017-05-15 DIAGNOSIS — R4182 Altered mental status, unspecified: Secondary | ICD-10-CM | POA: Diagnosis not present

## 2017-05-15 DIAGNOSIS — R509 Fever, unspecified: Secondary | ICD-10-CM | POA: Diagnosis not present

## 2017-05-15 DIAGNOSIS — Z992 Dependence on renal dialysis: Secondary | ICD-10-CM | POA: Diagnosis not present

## 2017-05-15 DIAGNOSIS — D631 Anemia in chronic kidney disease: Secondary | ICD-10-CM | POA: Diagnosis not present

## 2017-05-15 DIAGNOSIS — N186 End stage renal disease: Secondary | ICD-10-CM | POA: Diagnosis not present

## 2017-05-18 DIAGNOSIS — D509 Iron deficiency anemia, unspecified: Secondary | ICD-10-CM | POA: Diagnosis not present

## 2017-05-18 DIAGNOSIS — N2581 Secondary hyperparathyroidism of renal origin: Secondary | ICD-10-CM | POA: Diagnosis not present

## 2017-05-18 DIAGNOSIS — Z992 Dependence on renal dialysis: Secondary | ICD-10-CM | POA: Diagnosis not present

## 2017-05-18 DIAGNOSIS — N186 End stage renal disease: Secondary | ICD-10-CM | POA: Diagnosis not present

## 2017-05-18 DIAGNOSIS — D631 Anemia in chronic kidney disease: Secondary | ICD-10-CM | POA: Diagnosis not present

## 2017-05-18 DIAGNOSIS — R4182 Altered mental status, unspecified: Secondary | ICD-10-CM | POA: Diagnosis not present

## 2017-05-20 DIAGNOSIS — D631 Anemia in chronic kidney disease: Secondary | ICD-10-CM | POA: Diagnosis not present

## 2017-05-20 DIAGNOSIS — N2581 Secondary hyperparathyroidism of renal origin: Secondary | ICD-10-CM | POA: Diagnosis not present

## 2017-05-20 DIAGNOSIS — R4182 Altered mental status, unspecified: Secondary | ICD-10-CM | POA: Diagnosis not present

## 2017-05-20 DIAGNOSIS — N186 End stage renal disease: Secondary | ICD-10-CM | POA: Diagnosis not present

## 2017-05-20 DIAGNOSIS — D509 Iron deficiency anemia, unspecified: Secondary | ICD-10-CM | POA: Diagnosis not present

## 2017-05-20 DIAGNOSIS — Z992 Dependence on renal dialysis: Secondary | ICD-10-CM | POA: Diagnosis not present

## 2017-05-22 DIAGNOSIS — D509 Iron deficiency anemia, unspecified: Secondary | ICD-10-CM | POA: Diagnosis not present

## 2017-05-22 DIAGNOSIS — D631 Anemia in chronic kidney disease: Secondary | ICD-10-CM | POA: Diagnosis not present

## 2017-05-22 DIAGNOSIS — N2581 Secondary hyperparathyroidism of renal origin: Secondary | ICD-10-CM | POA: Diagnosis not present

## 2017-05-22 DIAGNOSIS — Z992 Dependence on renal dialysis: Secondary | ICD-10-CM | POA: Diagnosis not present

## 2017-05-22 DIAGNOSIS — R4182 Altered mental status, unspecified: Secondary | ICD-10-CM | POA: Diagnosis not present

## 2017-05-22 DIAGNOSIS — N186 End stage renal disease: Secondary | ICD-10-CM | POA: Diagnosis not present

## 2017-05-27 DIAGNOSIS — N2581 Secondary hyperparathyroidism of renal origin: Secondary | ICD-10-CM | POA: Diagnosis not present

## 2017-05-27 DIAGNOSIS — D631 Anemia in chronic kidney disease: Secondary | ICD-10-CM | POA: Diagnosis not present

## 2017-05-27 DIAGNOSIS — N186 End stage renal disease: Secondary | ICD-10-CM | POA: Diagnosis not present

## 2017-05-27 DIAGNOSIS — R4182 Altered mental status, unspecified: Secondary | ICD-10-CM | POA: Diagnosis not present

## 2017-05-27 DIAGNOSIS — Z992 Dependence on renal dialysis: Secondary | ICD-10-CM | POA: Diagnosis not present

## 2017-05-27 DIAGNOSIS — D509 Iron deficiency anemia, unspecified: Secondary | ICD-10-CM | POA: Diagnosis not present

## 2017-05-30 DIAGNOSIS — N186 End stage renal disease: Secondary | ICD-10-CM | POA: Diagnosis not present

## 2017-05-30 DIAGNOSIS — Z992 Dependence on renal dialysis: Secondary | ICD-10-CM | POA: Diagnosis not present

## 2017-05-30 DIAGNOSIS — D631 Anemia in chronic kidney disease: Secondary | ICD-10-CM | POA: Diagnosis not present

## 2017-05-30 DIAGNOSIS — R4182 Altered mental status, unspecified: Secondary | ICD-10-CM | POA: Diagnosis not present

## 2017-05-30 DIAGNOSIS — D509 Iron deficiency anemia, unspecified: Secondary | ICD-10-CM | POA: Diagnosis not present

## 2017-05-30 DIAGNOSIS — N2581 Secondary hyperparathyroidism of renal origin: Secondary | ICD-10-CM | POA: Diagnosis not present

## 2017-06-01 DIAGNOSIS — D631 Anemia in chronic kidney disease: Secondary | ICD-10-CM | POA: Diagnosis not present

## 2017-06-01 DIAGNOSIS — N186 End stage renal disease: Secondary | ICD-10-CM | POA: Diagnosis not present

## 2017-06-01 DIAGNOSIS — Z992 Dependence on renal dialysis: Secondary | ICD-10-CM | POA: Diagnosis not present

## 2017-06-01 DIAGNOSIS — R4182 Altered mental status, unspecified: Secondary | ICD-10-CM | POA: Diagnosis not present

## 2017-06-01 DIAGNOSIS — D509 Iron deficiency anemia, unspecified: Secondary | ICD-10-CM | POA: Diagnosis not present

## 2017-06-01 DIAGNOSIS — N2581 Secondary hyperparathyroidism of renal origin: Secondary | ICD-10-CM | POA: Diagnosis not present

## 2017-06-03 DIAGNOSIS — R4182 Altered mental status, unspecified: Secondary | ICD-10-CM | POA: Diagnosis not present

## 2017-06-03 DIAGNOSIS — Z992 Dependence on renal dialysis: Secondary | ICD-10-CM | POA: Diagnosis not present

## 2017-06-03 DIAGNOSIS — N2581 Secondary hyperparathyroidism of renal origin: Secondary | ICD-10-CM | POA: Diagnosis not present

## 2017-06-03 DIAGNOSIS — N186 End stage renal disease: Secondary | ICD-10-CM | POA: Diagnosis not present

## 2017-06-03 DIAGNOSIS — D631 Anemia in chronic kidney disease: Secondary | ICD-10-CM | POA: Diagnosis not present

## 2017-06-03 DIAGNOSIS — D509 Iron deficiency anemia, unspecified: Secondary | ICD-10-CM | POA: Diagnosis not present

## 2017-06-05 DIAGNOSIS — R4182 Altered mental status, unspecified: Secondary | ICD-10-CM | POA: Diagnosis not present

## 2017-06-05 DIAGNOSIS — D631 Anemia in chronic kidney disease: Secondary | ICD-10-CM | POA: Diagnosis not present

## 2017-06-05 DIAGNOSIS — Z992 Dependence on renal dialysis: Secondary | ICD-10-CM | POA: Diagnosis not present

## 2017-06-05 DIAGNOSIS — N186 End stage renal disease: Secondary | ICD-10-CM | POA: Diagnosis not present

## 2017-06-05 DIAGNOSIS — N2581 Secondary hyperparathyroidism of renal origin: Secondary | ICD-10-CM | POA: Diagnosis not present

## 2017-06-05 DIAGNOSIS — D509 Iron deficiency anemia, unspecified: Secondary | ICD-10-CM | POA: Diagnosis not present

## 2017-06-08 DIAGNOSIS — D509 Iron deficiency anemia, unspecified: Secondary | ICD-10-CM | POA: Diagnosis not present

## 2017-06-08 DIAGNOSIS — D631 Anemia in chronic kidney disease: Secondary | ICD-10-CM | POA: Diagnosis not present

## 2017-06-08 DIAGNOSIS — N2581 Secondary hyperparathyroidism of renal origin: Secondary | ICD-10-CM | POA: Diagnosis not present

## 2017-06-08 DIAGNOSIS — R4182 Altered mental status, unspecified: Secondary | ICD-10-CM | POA: Diagnosis not present

## 2017-06-08 DIAGNOSIS — Z992 Dependence on renal dialysis: Secondary | ICD-10-CM | POA: Diagnosis not present

## 2017-06-08 DIAGNOSIS — N186 End stage renal disease: Secondary | ICD-10-CM | POA: Diagnosis not present

## 2017-06-10 DIAGNOSIS — R4182 Altered mental status, unspecified: Secondary | ICD-10-CM | POA: Diagnosis not present

## 2017-06-10 DIAGNOSIS — N2581 Secondary hyperparathyroidism of renal origin: Secondary | ICD-10-CM | POA: Diagnosis not present

## 2017-06-10 DIAGNOSIS — D509 Iron deficiency anemia, unspecified: Secondary | ICD-10-CM | POA: Diagnosis not present

## 2017-06-10 DIAGNOSIS — N186 End stage renal disease: Secondary | ICD-10-CM | POA: Diagnosis not present

## 2017-06-10 DIAGNOSIS — D631 Anemia in chronic kidney disease: Secondary | ICD-10-CM | POA: Diagnosis not present

## 2017-06-10 DIAGNOSIS — Z992 Dependence on renal dialysis: Secondary | ICD-10-CM | POA: Diagnosis not present

## 2017-06-12 DIAGNOSIS — D509 Iron deficiency anemia, unspecified: Secondary | ICD-10-CM | POA: Diagnosis not present

## 2017-06-12 DIAGNOSIS — Z992 Dependence on renal dialysis: Secondary | ICD-10-CM | POA: Diagnosis not present

## 2017-06-12 DIAGNOSIS — N2581 Secondary hyperparathyroidism of renal origin: Secondary | ICD-10-CM | POA: Diagnosis not present

## 2017-06-12 DIAGNOSIS — R4182 Altered mental status, unspecified: Secondary | ICD-10-CM | POA: Diagnosis not present

## 2017-06-12 DIAGNOSIS — N186 End stage renal disease: Secondary | ICD-10-CM | POA: Diagnosis not present

## 2017-06-12 DIAGNOSIS — D631 Anemia in chronic kidney disease: Secondary | ICD-10-CM | POA: Diagnosis not present

## 2017-06-15 DIAGNOSIS — N186 End stage renal disease: Secondary | ICD-10-CM | POA: Diagnosis not present

## 2017-06-15 DIAGNOSIS — Z992 Dependence on renal dialysis: Secondary | ICD-10-CM | POA: Diagnosis not present

## 2017-06-15 DIAGNOSIS — D631 Anemia in chronic kidney disease: Secondary | ICD-10-CM | POA: Diagnosis not present

## 2017-06-15 DIAGNOSIS — I120 Hypertensive chronic kidney disease with stage 5 chronic kidney disease or end stage renal disease: Secondary | ICD-10-CM | POA: Diagnosis not present

## 2017-06-15 DIAGNOSIS — N2581 Secondary hyperparathyroidism of renal origin: Secondary | ICD-10-CM | POA: Diagnosis not present

## 2017-06-15 DIAGNOSIS — D509 Iron deficiency anemia, unspecified: Secondary | ICD-10-CM | POA: Diagnosis not present

## 2017-06-17 DIAGNOSIS — D631 Anemia in chronic kidney disease: Secondary | ICD-10-CM | POA: Diagnosis not present

## 2017-06-17 DIAGNOSIS — N186 End stage renal disease: Secondary | ICD-10-CM | POA: Diagnosis not present

## 2017-06-17 DIAGNOSIS — N2581 Secondary hyperparathyroidism of renal origin: Secondary | ICD-10-CM | POA: Diagnosis not present

## 2017-06-17 DIAGNOSIS — D509 Iron deficiency anemia, unspecified: Secondary | ICD-10-CM | POA: Diagnosis not present

## 2017-06-19 DIAGNOSIS — D631 Anemia in chronic kidney disease: Secondary | ICD-10-CM | POA: Diagnosis not present

## 2017-06-19 DIAGNOSIS — N2581 Secondary hyperparathyroidism of renal origin: Secondary | ICD-10-CM | POA: Diagnosis not present

## 2017-06-19 DIAGNOSIS — N186 End stage renal disease: Secondary | ICD-10-CM | POA: Diagnosis not present

## 2017-06-19 DIAGNOSIS — D509 Iron deficiency anemia, unspecified: Secondary | ICD-10-CM | POA: Diagnosis not present

## 2017-06-23 DIAGNOSIS — N2581 Secondary hyperparathyroidism of renal origin: Secondary | ICD-10-CM | POA: Diagnosis not present

## 2017-06-23 DIAGNOSIS — D509 Iron deficiency anemia, unspecified: Secondary | ICD-10-CM | POA: Diagnosis not present

## 2017-06-23 DIAGNOSIS — N186 End stage renal disease: Secondary | ICD-10-CM | POA: Diagnosis not present

## 2017-06-23 DIAGNOSIS — D631 Anemia in chronic kidney disease: Secondary | ICD-10-CM | POA: Diagnosis not present

## 2017-06-24 DIAGNOSIS — N2581 Secondary hyperparathyroidism of renal origin: Secondary | ICD-10-CM | POA: Diagnosis not present

## 2017-06-24 DIAGNOSIS — D509 Iron deficiency anemia, unspecified: Secondary | ICD-10-CM | POA: Diagnosis not present

## 2017-06-24 DIAGNOSIS — D631 Anemia in chronic kidney disease: Secondary | ICD-10-CM | POA: Diagnosis not present

## 2017-06-24 DIAGNOSIS — N186 End stage renal disease: Secondary | ICD-10-CM | POA: Diagnosis not present

## 2017-06-24 NOTE — Telephone Encounter (Signed)
This encounter was created in error - please disregard.

## 2017-06-25 ENCOUNTER — Other Ambulatory Visit: Payer: Self-pay

## 2017-06-25 NOTE — Patient Outreach (Signed)
[facility]) Care Management  06/25/2017  [facility] BLOODWORTH 1960-09-10 [id number]   Telephone Screen  Referral Date: 06/23/17 Referral Source: Clinical wellness Coach-Population Health Referral Reason: "request that [facility] be involved with patient ongoing. Has history of mini strokes, staff at dialysis center concerned with changes in mental status, forgetting to arrange transportation to dialysis on wrong days, increasingly disinhibited behaviors, pt displayed bowel incontinence episode at clinic and had no apparent awareness, pt has dialysis M,W,F morning shift" Insurance: Medicare   Outreach attempt # 1 to patient. No answer at present. RN CM left HIPAA compliant voicemail message along with contact info. Outreach attempt to patient's sister ([name] Harbuck-ROI on file). Number disconnected. RN CM attempted alternate number and voicemail message left.     Plan: RN CM will make outreach attempt to patient within 3-4 business days. RN CM will send unsuccessful outreach letter to patient.   [name], RN,BSN,CCM [facility] Care Management Telephonic Care Management Coordinator Direct Phone: [phone] Toll Free: [phone] Fax: [phone]

## 2017-06-26 ENCOUNTER — Other Ambulatory Visit: Payer: Self-pay

## 2017-06-26 DIAGNOSIS — D509 Iron deficiency anemia, unspecified: Secondary | ICD-10-CM | POA: Diagnosis not present

## 2017-06-26 DIAGNOSIS — N2581 Secondary hyperparathyroidism of renal origin: Secondary | ICD-10-CM | POA: Diagnosis not present

## 2017-06-26 DIAGNOSIS — N186 End stage renal disease: Secondary | ICD-10-CM | POA: Diagnosis not present

## 2017-06-26 DIAGNOSIS — D631 Anemia in chronic kidney disease: Secondary | ICD-10-CM | POA: Diagnosis not present

## 2017-06-26 NOTE — Patient Outreach (Signed)
[facility]) Care Management  06/26/2017  [name] [birth year] [id number]     Telephone Screen  Referral Date: 06/23/17 Referral Source: Clinical wellness Coach-Population Health Referral Reason: "request that [facility] be involved with patient ongoing. Has history of mini strokes, staff at dialysis center concerned with changes in mental status, forgetting to arrange transportation to dialysis on wrong days, increasingly disinhibited behaviors, pt displayed bowel incontinence episode at clinic and had no apparent awareness, pt has dialysis M,W,F morning shift" Insurance: Medicare   Voicemail message received from patient' sister. Return call placed. No answer at present.    Plan: RN CM will make outreach attempt within three business days.   [name], RN,BSN,CCM [facility] Care Management Telephonic Care Management Coordinator Direct Phone: [phone] Toll Free: [phone] Fax: [phone]

## 2017-06-29 DIAGNOSIS — N186 End stage renal disease: Secondary | ICD-10-CM | POA: Diagnosis not present

## 2017-06-29 DIAGNOSIS — D631 Anemia in chronic kidney disease: Secondary | ICD-10-CM | POA: Diagnosis not present

## 2017-06-29 DIAGNOSIS — N2581 Secondary hyperparathyroidism of renal origin: Secondary | ICD-10-CM | POA: Diagnosis not present

## 2017-06-29 DIAGNOSIS — D509 Iron deficiency anemia, unspecified: Secondary | ICD-10-CM | POA: Diagnosis not present

## 2017-06-30 ENCOUNTER — Other Ambulatory Visit: Payer: Self-pay

## 2017-06-30 NOTE — Patient Outreach (Signed)
[facility]) Care Management  06/30/2017  [name] [birth year] [id number]   Telephone Screen  Referral Date:06/23/17 Referral Source:Clinical wellness Coach-Population Health Referral Reason: "request that [facility] be involved with patient ongoing. Has history of mini strokes, staff at dialysis center concerned with changes in mental status, forgetting to arrange transportation to dialysis on wrong days, increasingly disinhibited behaviors, pt displayed bowel incontinence episode at clinic and had no apparent awareness, pt has dialysis M,W,F morning shift" Insurance:Medicare    Outreach attempt #2 to patient. No answer. RN CM left HIPAA compliant voicemail message along with contact info. RN CM attempted to reach patient's sister but no answer and voicemail message left.        Plan: RN CM will make outreach attempt within 3-4 business days.  [name], RN,BSN,CCM [facility] Care Management Telephonic Care Management Coordinator Direct Phone: [phone] Toll Free: [phone] Fax: [phone]

## 2017-07-01 DIAGNOSIS — N186 End stage renal disease: Secondary | ICD-10-CM | POA: Diagnosis not present

## 2017-07-01 DIAGNOSIS — N2581 Secondary hyperparathyroidism of renal origin: Secondary | ICD-10-CM | POA: Diagnosis not present

## 2017-07-01 DIAGNOSIS — D631 Anemia in chronic kidney disease: Secondary | ICD-10-CM | POA: Diagnosis not present

## 2017-07-01 DIAGNOSIS — D509 Iron deficiency anemia, unspecified: Secondary | ICD-10-CM | POA: Diagnosis not present

## 2017-07-02 ENCOUNTER — Other Ambulatory Visit: Payer: Self-pay

## 2017-07-02 NOTE — Patient Outreach (Signed)
[facility]) Care Management  07/02/2017  [name] [birth year] [id number]     Telephone Screen  Referral Date:06/23/17 Referral Source:Clinical wellness Coach-Population Health Referral Reason: "request that [facility] be involved with patient ongoing. Has history of mini strokes, staff at dialysis center concerned with changes in mental status, forgetting to arrange transportation to dialysis on wrong days, increasingly disinhibited behaviors, pt displayed bowel incontinence episode at clinic and had no apparent awareness, pt has dialysis M,W,F morning shift" Insurance:Medicare   Outreach attempt #3. RN CM placed call to both patient and his sister and unable to reach either one of them.      Plan: RN CM will close case if no response from letter mailed to patient.    [name], RN,BSN,CCM [facility] Care Management Telephonic Care Management Coordinator Direct Phone: [phone] Toll Free: [phone] Fax: [phone]

## 2017-07-03 DIAGNOSIS — N186 End stage renal disease: Secondary | ICD-10-CM | POA: Diagnosis not present

## 2017-07-03 DIAGNOSIS — N2581 Secondary hyperparathyroidism of renal origin: Secondary | ICD-10-CM | POA: Diagnosis not present

## 2017-07-03 DIAGNOSIS — D631 Anemia in chronic kidney disease: Secondary | ICD-10-CM | POA: Diagnosis not present

## 2017-07-03 DIAGNOSIS — D509 Iron deficiency anemia, unspecified: Secondary | ICD-10-CM | POA: Diagnosis not present

## 2017-07-05 ENCOUNTER — Inpatient Hospital Stay (HOSPITAL_COMMUNITY): Payer: Medicare Other | Admitting: Anesthesiology

## 2017-07-05 ENCOUNTER — Emergency Department (HOSPITAL_COMMUNITY): Payer: Medicare Other

## 2017-07-05 ENCOUNTER — Encounter (HOSPITAL_COMMUNITY): Payer: Self-pay | Admitting: Anesthesiology

## 2017-07-05 ENCOUNTER — Inpatient Hospital Stay (HOSPITAL_COMMUNITY): Payer: Medicare Other

## 2017-07-05 ENCOUNTER — Inpatient Hospital Stay (HOSPITAL_COMMUNITY)
Admission: EM | Admit: 2017-07-05 | Discharge: 2017-07-17 | DRG: 064 | Disposition: A | Discharge status: Skilled Nursing Facility | Payer: Medicare Other | Attending: Neurology | Admitting: Neurology

## 2017-07-05 ENCOUNTER — Encounter (HOSPITAL_COMMUNITY)
Admission: EM | Disposition: A | Discharge status: Skilled Nursing Facility | Payer: Self-pay | Source: Home / Self Care | Attending: Neurology

## 2017-07-05 DIAGNOSIS — I5189 Other ill-defined heart diseases: Secondary | ICD-10-CM | POA: Diagnosis not present

## 2017-07-05 DIAGNOSIS — I214 Non-ST elevation (NSTEMI) myocardial infarction: Secondary | ICD-10-CM

## 2017-07-05 DIAGNOSIS — I6522 Occlusion and stenosis of left carotid artery: Secondary | ICD-10-CM | POA: Diagnosis present

## 2017-07-05 DIAGNOSIS — G8101 Flaccid hemiplegia affecting right dominant side: Secondary | ICD-10-CM | POA: Diagnosis present

## 2017-07-05 DIAGNOSIS — I120 Hypertensive chronic kidney disease with stage 5 chronic kidney disease or end stage renal disease: Secondary | ICD-10-CM | POA: Diagnosis not present

## 2017-07-05 DIAGNOSIS — I69320 Aphasia following cerebral infarction: Secondary | ICD-10-CM | POA: Diagnosis not present

## 2017-07-05 DIAGNOSIS — I5022 Chronic systolic (congestive) heart failure: Secondary | ICD-10-CM | POA: Diagnosis not present

## 2017-07-05 DIAGNOSIS — E785 Hyperlipidemia, unspecified: Secondary | ICD-10-CM | POA: Diagnosis not present

## 2017-07-05 DIAGNOSIS — Z885 Allergy status to narcotic agent status: Secondary | ICD-10-CM

## 2017-07-05 DIAGNOSIS — Z91013 Allergy to seafood: Secondary | ICD-10-CM

## 2017-07-05 DIAGNOSIS — D631 Anemia in chronic kidney disease: Secondary | ICD-10-CM | POA: Diagnosis present

## 2017-07-05 DIAGNOSIS — I63419 Cerebral infarction due to embolism of unspecified middle cerebral artery: Secondary | ICD-10-CM

## 2017-07-05 DIAGNOSIS — Z7902 Long term (current) use of antithrombotics/antiplatelets: Secondary | ICD-10-CM

## 2017-07-05 DIAGNOSIS — R1310 Dysphagia, unspecified: Secondary | ICD-10-CM | POA: Diagnosis not present

## 2017-07-05 DIAGNOSIS — I5043 Acute on chronic combined systolic (congestive) and diastolic (congestive) heart failure: Secondary | ICD-10-CM | POA: Diagnosis not present

## 2017-07-05 DIAGNOSIS — Z951 Presence of aortocoronary bypass graft: Secondary | ICD-10-CM

## 2017-07-05 DIAGNOSIS — I132 Hypertensive heart and chronic kidney disease with heart failure and with stage 5 chronic kidney disease, or end stage renal disease: Secondary | ICD-10-CM | POA: Diagnosis present

## 2017-07-05 DIAGNOSIS — I63512 Cerebral infarction due to unspecified occlusion or stenosis of left middle cerebral artery: Secondary | ICD-10-CM | POA: Diagnosis present

## 2017-07-05 DIAGNOSIS — D638 Anemia in other chronic diseases classified elsewhere: Secondary | ICD-10-CM | POA: Diagnosis not present

## 2017-07-05 DIAGNOSIS — Z6827 Body mass index (BMI) 27.0-27.9, adult: Secondary | ICD-10-CM

## 2017-07-05 DIAGNOSIS — Z955 Presence of coronary angioplasty implant and graft: Secondary | ICD-10-CM | POA: Diagnosis not present

## 2017-07-05 DIAGNOSIS — I63312 Cerebral infarction due to thrombosis of left middle cerebral artery: Secondary | ICD-10-CM | POA: Diagnosis present

## 2017-07-05 DIAGNOSIS — I959 Hypotension, unspecified: Secondary | ICD-10-CM | POA: Diagnosis not present

## 2017-07-05 DIAGNOSIS — R278 Other lack of coordination: Secondary | ICD-10-CM | POA: Diagnosis not present

## 2017-07-05 DIAGNOSIS — R001 Bradycardia, unspecified: Secondary | ICD-10-CM | POA: Diagnosis not present

## 2017-07-05 DIAGNOSIS — I509 Heart failure, unspecified: Secondary | ICD-10-CM | POA: Diagnosis not present

## 2017-07-05 DIAGNOSIS — Z7982 Long term (current) use of aspirin: Secondary | ICD-10-CM

## 2017-07-05 DIAGNOSIS — I6389 Other cerebral infarction: Secondary | ICD-10-CM | POA: Diagnosis not present

## 2017-07-05 DIAGNOSIS — M6281 Muscle weakness (generalized): Secondary | ICD-10-CM | POA: Diagnosis not present

## 2017-07-05 DIAGNOSIS — R29818 Other symptoms and signs involving the nervous system: Secondary | ICD-10-CM | POA: Diagnosis not present

## 2017-07-05 DIAGNOSIS — I236 Thrombosis of atrium, auricular appendage, and ventricle as current complications following acute myocardial infarction: Secondary | ICD-10-CM

## 2017-07-05 DIAGNOSIS — I672 Cerebral atherosclerosis: Secondary | ICD-10-CM | POA: Diagnosis present

## 2017-07-05 DIAGNOSIS — I5042 Chronic combined systolic (congestive) and diastolic (congestive) heart failure: Secondary | ICD-10-CM | POA: Diagnosis present

## 2017-07-05 DIAGNOSIS — I6523 Occlusion and stenosis of bilateral carotid arteries: Secondary | ICD-10-CM | POA: Diagnosis not present

## 2017-07-05 DIAGNOSIS — Z823 Family history of stroke: Secondary | ICD-10-CM

## 2017-07-05 DIAGNOSIS — I10 Essential (primary) hypertension: Secondary | ICD-10-CM | POA: Diagnosis present

## 2017-07-05 DIAGNOSIS — H518 Other specified disorders of binocular movement: Secondary | ICD-10-CM | POA: Diagnosis present

## 2017-07-05 DIAGNOSIS — I454 Nonspecific intraventricular block: Secondary | ICD-10-CM | POA: Diagnosis not present

## 2017-07-05 DIAGNOSIS — R4701 Aphasia: Secondary | ICD-10-CM | POA: Diagnosis present

## 2017-07-05 DIAGNOSIS — R279 Unspecified lack of coordination: Secondary | ICD-10-CM | POA: Diagnosis not present

## 2017-07-05 DIAGNOSIS — Z8249 Family history of ischemic heart disease and other diseases of the circulatory system: Secondary | ICD-10-CM

## 2017-07-05 DIAGNOSIS — Z888 Allergy status to other drugs, medicaments and biological substances status: Secondary | ICD-10-CM

## 2017-07-05 DIAGNOSIS — K625 Hemorrhage of anus and rectum: Secondary | ICD-10-CM | POA: Diagnosis not present

## 2017-07-05 DIAGNOSIS — N186 End stage renal disease: Secondary | ICD-10-CM

## 2017-07-05 DIAGNOSIS — Z992 Dependence on renal dialysis: Secondary | ICD-10-CM

## 2017-07-05 DIAGNOSIS — I472 Ventricular tachycardia: Secondary | ICD-10-CM | POA: Diagnosis not present

## 2017-07-05 DIAGNOSIS — E669 Obesity, unspecified: Secondary | ICD-10-CM | POA: Diagnosis present

## 2017-07-05 DIAGNOSIS — I2510 Atherosclerotic heart disease of native coronary artery without angina pectoris: Secondary | ICD-10-CM | POA: Diagnosis present

## 2017-07-05 DIAGNOSIS — I679 Cerebrovascular disease, unspecified: Secondary | ICD-10-CM | POA: Diagnosis not present

## 2017-07-05 DIAGNOSIS — I639 Cerebral infarction, unspecified: Secondary | ICD-10-CM

## 2017-07-05 DIAGNOSIS — I252 Old myocardial infarction: Secondary | ICD-10-CM

## 2017-07-05 DIAGNOSIS — R29810 Facial weakness: Secondary | ICD-10-CM | POA: Diagnosis present

## 2017-07-05 DIAGNOSIS — R2689 Other abnormalities of gait and mobility: Secondary | ICD-10-CM | POA: Diagnosis not present

## 2017-07-05 DIAGNOSIS — Z8673 Personal history of transient ischemic attack (TIA), and cerebral infarction without residual deficits: Secondary | ICD-10-CM | POA: Diagnosis not present

## 2017-07-05 DIAGNOSIS — Z87891 Personal history of nicotine dependence: Secondary | ICD-10-CM

## 2017-07-05 DIAGNOSIS — R29722 NIHSS score 22: Secondary | ICD-10-CM | POA: Diagnosis present

## 2017-07-05 DIAGNOSIS — N2581 Secondary hyperparathyroidism of renal origin: Secondary | ICD-10-CM | POA: Diagnosis present

## 2017-07-05 DIAGNOSIS — I6612 Occlusion and stenosis of left anterior cerebral artery: Secondary | ICD-10-CM | POA: Diagnosis not present

## 2017-07-05 DIAGNOSIS — Z743 Need for continuous supervision: Secondary | ICD-10-CM | POA: Diagnosis not present

## 2017-07-05 DIAGNOSIS — R4189 Other symptoms and signs involving cognitive functions and awareness: Secondary | ICD-10-CM | POA: Diagnosis present

## 2017-07-05 DIAGNOSIS — R531 Weakness: Secondary | ICD-10-CM | POA: Diagnosis not present

## 2017-07-05 DIAGNOSIS — I6602 Occlusion and stenosis of left middle cerebral artery: Secondary | ICD-10-CM | POA: Diagnosis not present

## 2017-07-05 DIAGNOSIS — I69351 Hemiplegia and hemiparesis following cerebral infarction affecting right dominant side: Secondary | ICD-10-CM | POA: Diagnosis not present

## 2017-07-05 DIAGNOSIS — I63411 Cerebral infarction due to embolism of right middle cerebral artery: Secondary | ICD-10-CM | POA: Diagnosis present

## 2017-07-05 DIAGNOSIS — I69391 Dysphagia following cerebral infarction: Secondary | ICD-10-CM

## 2017-07-05 DIAGNOSIS — Z8349 Family history of other endocrine, nutritional and metabolic diseases: Secondary | ICD-10-CM

## 2017-07-05 DIAGNOSIS — K219 Gastro-esophageal reflux disease without esophagitis: Secondary | ICD-10-CM | POA: Diagnosis present

## 2017-07-05 DIAGNOSIS — I429 Cardiomyopathy, unspecified: Secondary | ICD-10-CM | POA: Diagnosis present

## 2017-07-05 DIAGNOSIS — I255 Ischemic cardiomyopathy: Secondary | ICD-10-CM | POA: Diagnosis not present

## 2017-07-05 DIAGNOSIS — I351 Nonrheumatic aortic (valve) insufficiency: Secondary | ICD-10-CM | POA: Diagnosis not present

## 2017-07-05 DIAGNOSIS — E7800 Pure hypercholesterolemia, unspecified: Secondary | ICD-10-CM | POA: Diagnosis not present

## 2017-07-05 DIAGNOSIS — R000 Tachycardia, unspecified: Secondary | ICD-10-CM | POA: Diagnosis not present

## 2017-07-05 DIAGNOSIS — G4733 Obstructive sleep apnea (adult) (pediatric): Secondary | ICD-10-CM | POA: Diagnosis present

## 2017-07-05 DIAGNOSIS — I6789 Other cerebrovascular disease: Secondary | ICD-10-CM | POA: Diagnosis not present

## 2017-07-05 HISTORY — PX: RADIOLOGY WITH ANESTHESIA: SHX6223

## 2017-07-05 LAB — COMPREHENSIVE METABOLIC PANEL
ALBUMIN: 3.9 g/dL (ref 3.5–5.0)
ALT: 9 U/L — ABNORMAL LOW (ref 17–63)
ANION GAP: 22 — AB (ref 5–15)
AST: 15 U/L (ref 15–41)
Alkaline Phosphatase: 40 U/L (ref 38–126)
BUN: 39 mg/dL — ABNORMAL HIGH (ref 6–20)
CALCIUM: 10.7 mg/dL — AB (ref 8.9–10.3)
CO2: 20 mmol/L — ABNORMAL LOW (ref 22–32)
Chloride: 94 mmol/L — ABNORMAL LOW (ref 101–111)
Creatinine, Ser: 14.46 mg/dL — ABNORMAL HIGH (ref 0.61–1.24)
GFR, EST AFRICAN AMERICAN: 4 mL/min — AB (ref >60)
GFR, EST NON AFRICAN AMERICAN: 3 mL/min — AB (ref >60)
GLUCOSE: 135 mg/dL — AB (ref 65–99)
POTASSIUM: 4 mmol/L (ref 3.5–5.1)
Sodium: 136 mmol/L (ref 135–145)
Total Bilirubin: 1 mg/dL (ref 0.3–1.2)
Total Protein: 7.4 g/dL (ref 6.5–8.1)

## 2017-07-05 LAB — I-STAT CHEM 8, ED
BUN: 48 mg/dL — ABNORMAL HIGH (ref 6–20)
CALCIUM ION: 1.09 mmol/L — AB (ref 1.15–1.40)
CHLORIDE: 98 mmol/L — AB (ref 101–111)
Creatinine, Ser: 14.8 mg/dL — ABNORMAL HIGH (ref 0.61–1.24)
GLUCOSE: 130 mg/dL — AB (ref 65–99)
HCT: 37 % — ABNORMAL LOW (ref 39.0–52.0)
HEMOGLOBIN: 12.6 g/dL — AB (ref 13.0–17.0)
Potassium: 4.8 mmol/L (ref 3.5–5.1)
SODIUM: 133 mmol/L — AB (ref 135–145)
TCO2: 21 mmol/L — AB (ref 22–32)

## 2017-07-05 LAB — CBC
HCT: 38.9 % — ABNORMAL LOW (ref 39.0–52.0)
Hemoglobin: 13.2 g/dL (ref 13.0–17.0)
MCH: 31.4 pg (ref 26.0–34.0)
MCHC: 33.9 g/dL (ref 30.0–36.0)
MCV: 92.4 fL (ref 78.0–100.0)
PLATELETS: 191 10*3/uL (ref 150–400)
RBC: 4.21 MIL/uL — ABNORMAL LOW (ref 4.22–5.81)
RDW: 14.3 % (ref 11.5–15.5)
WBC: 14.8 10*3/uL — ABNORMAL HIGH (ref 4.0–10.5)

## 2017-07-05 LAB — DIFFERENTIAL
Basophils Absolute: 0 10*3/uL (ref 0.0–0.1)
Basophils Relative: 0 %
EOS ABS: 0.1 10*3/uL (ref 0.0–0.7)
EOS PCT: 1 %
LYMPHS ABS: 1.7 10*3/uL (ref 0.7–4.0)
Lymphocytes Relative: 11 %
MONO ABS: 0.3 10*3/uL (ref 0.1–1.0)
Monocytes Relative: 2 %
NEUTROS PCT: 86 %
Neutro Abs: 12.7 10*3/uL — ABNORMAL HIGH (ref 1.7–7.7)

## 2017-07-05 LAB — PROTIME-INR
INR: 1.16
PROTHROMBIN TIME: 14.7 s (ref 11.4–15.2)

## 2017-07-05 LAB — I-STAT TROPONIN, ED: TROPONIN I, POC: 0.08 ng/mL (ref 0.00–0.08)

## 2017-07-05 LAB — CBG MONITORING, ED: GLUCOSE-CAPILLARY: 125 mg/dL — AB (ref 65–99)

## 2017-07-05 LAB — APTT: aPTT: 22 seconds — ABNORMAL LOW (ref 24–36)

## 2017-07-05 SURGERY — RADIOLOGY WITH ANESTHESIA
Anesthesia: General

## 2017-07-05 MED ORDER — PROPOFOL 10 MG/ML IV BOLUS
INTRAVENOUS | Status: DC | PRN
  Administered 2017-07-05: 90 mg via INTRAVENOUS
  Administered 2017-07-05: 20 mg via INTRAVENOUS

## 2017-07-05 MED ORDER — SODIUM CHLORIDE 0.9 % IV SOLN
INTRAVENOUS | Status: DC
  Administered 2017-07-05 – 2017-07-06 (×2): via INTRAVENOUS

## 2017-07-05 MED ORDER — EPTIFIBATIDE 20 MG/10ML IV SOLN
INTRAVENOUS | Status: AC
  Administered 2017-07-05 – 2017-07-06 (×2): 1.8 mg via INTRA_ARTERIAL
  Filled 2017-07-05: qty 10

## 2017-07-05 MED ORDER — PHENYLEPHRINE HCL 10 MG/ML IJ SOLN
INTRAVENOUS | Status: DC | PRN
  Administered 2017-07-05: 50 ug/min via INTRAVENOUS

## 2017-07-05 MED ORDER — IOPAMIDOL (ISOVUE-370) INJECTION 76%
INTRAVENOUS | Status: AC
  Filled 2017-07-05: qty 100

## 2017-07-05 MED ORDER — IOPAMIDOL (ISOVUE-370) INJECTION 76%
100.0000 mL | Freq: Once | INTRAVENOUS | Status: AC | PRN
  Administered 2017-07-05: 100 mL via INTRAVENOUS

## 2017-07-05 MED ORDER — SUCCINYLCHOLINE CHLORIDE 20 MG/ML IJ SOLN
INTRAMUSCULAR | Status: DC | PRN
  Administered 2017-07-05: 130 mg via INTRAVENOUS

## 2017-07-05 MED ORDER — ACETAMINOPHEN 160 MG/5ML PO SOLN: 650.0000 mg | ORAL | Status: DC | PRN

## 2017-07-05 MED ORDER — CEFAZOLIN SODIUM-DEXTROSE 2-3 GM-%(50ML) IV SOLR
INTRAVENOUS | Status: DC | PRN
  Administered 2017-07-05: 2 g via INTRAVENOUS

## 2017-07-05 MED ORDER — ACETAMINOPHEN 650 MG RE SUPP: 650.0000 mg | RECTAL | Status: DC | PRN

## 2017-07-05 MED ORDER — ACETAMINOPHEN 325 MG PO TABS
650.0000 mg | ORAL_TABLET | ORAL | Status: DC | PRN
  Administered 2017-07-08 – 2017-07-10 (×2): 650 mg via ORAL
  Filled 2017-07-05 (×2): qty 2

## 2017-07-05 MED ORDER — CLOPIDOGREL BISULFATE 300 MG PO TABS
ORAL_TABLET | ORAL | Status: AC
  Filled 2017-07-05: qty 1

## 2017-07-05 MED ORDER — TICAGRELOR 90 MG PO TABS
ORAL_TABLET | ORAL | Status: AC
  Filled 2017-07-05: qty 2

## 2017-07-05 MED ORDER — FENTANYL CITRATE (PF) 100 MCG/2ML IJ SOLN
INTRAMUSCULAR | Status: DC | PRN
  Administered 2017-07-05 – 2017-07-06 (×2): 25 ug via INTRAVENOUS

## 2017-07-05 MED ORDER — LIDOCAINE HCL 1 % IJ SOLN
INTRAMUSCULAR | Status: AC
  Filled 2017-07-05: qty 20

## 2017-07-05 MED ORDER — ASPIRIN 325 MG PO TABS
ORAL_TABLET | ORAL | Status: AC
  Filled 2017-07-05: qty 1

## 2017-07-05 MED ORDER — CISATRACURIUM BESYLATE (PF) 200 MG/20ML IV SOLN
10.0000 mg | Freq: Once | INTRAVENOUS | Status: AC
  Administered 2017-07-05: 8 mg via INTRAVENOUS
  Administered 2017-07-05: 4 mg via INTRAVENOUS
  Filled 2017-07-05: qty 20

## 2017-07-05 MED ORDER — FENTANYL CITRATE (PF) 100 MCG/2ML IJ SOLN
INTRAMUSCULAR | Status: AC
  Filled 2017-07-05: qty 2

## 2017-07-05 MED ORDER — IOPAMIDOL (ISOVUE-300) INJECTION 61%
INTRAVENOUS | Status: AC
Start: 2017-07-05 — End: 2017-07-06
  Administered 2017-07-06: 75 mL
  Filled 2017-07-05: qty 300

## 2017-07-05 MED ORDER — NITROGLYCERIN 1 MG/10 ML FOR IR/CATH LAB
INTRA_ARTERIAL | Status: AC
  Filled 2017-07-05: qty 10

## 2017-07-05 MED ORDER — TIROFIBAN HCL IN NACL 5-0.9 MG/100ML-% IV SOLN
INTRAVENOUS | Status: AC
  Filled 2017-07-05: qty 100

## 2017-07-05 MED ORDER — LIDOCAINE HCL (CARDIAC) PF 100 MG/5ML IV SOSY
PREFILLED_SYRINGE | INTRAVENOUS | Status: DC | PRN
  Administered 2017-07-05: 40 mg via INTRAVENOUS

## 2017-07-05 MED ORDER — STROKE: EARLY STAGES OF RECOVERY BOOK
Freq: Once | Status: DC
  Filled 2017-07-05: qty 1

## 2017-07-05 MED ORDER — CEFAZOLIN SODIUM-DEXTROSE 2-4 GM/100ML-% IV SOLN
INTRAVENOUS | Status: AC
  Filled 2017-07-05: qty 100

## 2017-07-05 MED ORDER — PHENYLEPHRINE HCL 10 MG/ML IJ SOLN
INTRAMUSCULAR | Status: DC | PRN
  Administered 2017-07-05 (×2): 40 ug via INTRAVENOUS

## 2017-07-05 NOTE — ED Provider Notes (Signed)
[facility] EMERGENCY DEPARTMENT Provider Note   CSN: [id number] Arrival date & time: 07/05/17  2131   LEVEL 5 CAVEAT - ALTERED MENTAL STATUS  History   Chief Complaint Chief Complaint  Patient presents with  . Code Stroke    HPI [name] is a 57 y.o. male.  HPI  57 year old male with a prior history of stroke, CHF, chronic kidney disease presents with acute strokelike symptoms.  History is very limited and taken from EMS.  He was last seen normal about 2 days ago but then around 6 PM people were talking to him and stated he sounded normal blood through the door and they did not actually see him.  Then at 930 he was found on the floor and has had right-sided weakness.  Activated as a code stroke.  No further history obtained as the patient is a phasic and unable to talk to me.  Past Medical History:  Diagnosis Date  . ACHALASIA   . ANEMIA-NOS   . CAD (coronary artery disease), 3 vessel significant disease.    . Cardiomyopathy (HCC) 05/12/2017  . CEREBROVASCULAR ACCIDENT, HX OF   . CHF (congestive heart failure) (HCC)   . CKD (chronic kidney disease) stage 4, GFR 15-29 ml/min (HCC)   . Constipation   . DEGENERATIVE JOINT DISEASE, SHOULDER    knee  . DEPRESSION    2006- not depressed any longer  . Family history of adverse reaction to anesthesia    Mother, hard to awaken  . GERD    no meds required  . Gout    takes Uloric daily  . H/O hiatal hernia   . Headache(784.0)   . Heart attack (HCC)   . History of colon polyps   . History of diastolic dysfunction, grade 2 by echo   . HYPERLIPIDEMIA    takes crestor daily  . Hyperlipidemia   . HYPERTENSION    takes Norvasc,Catapress,Hydralazine,Imdur,and Metoprolol daily  . Impaired glucose tolerance   . Insomnia    takes Ambien nightly  . NSTEMI (non-ST elevated myocardial infarction), 06/30/11 07/01/2011  . RENAL CALCULUS, HX OF   . RENAL INSUFFICIENCY    12/2013- TThSat  . S/P coronary artery  stent placement, PTCA/DES Resolute to mid-LAD via the LIMA graft, and PTCA of apical 95% stenosis 07/05/11 07/04/2011  . S/P PTCA (percutaneous transluminal coronary angioplasty), 06/30/11 07/01/2011  . Seizures (HCC)    INSULIN INDUCED  . Sleep apnea    SLEEP STUDY IN [place] , NO MACHINE YET  1 YEAR  . Stroke (HCC)    93/2005/2006/2007;left sided weakness.  1993 12/2013  . Thyroid disease     Patient Active Problem List   Diagnosis Date Noted  . Stroke (cerebrum) (HCC) 07/05/2017  . Cardiomyopathy (HCC) 05/12/2017  . Thrombocytopenia (HCC) 03/04/2017  . Elevated PSA 11/04/2016  . OSA (obstructive sleep apnea) 01/08/2016  . Mild cognitive impairment 12/11/2015  . Sleep disorder breathing 10/31/2015  . Urethritis 07/17/2015  . End stage renal disease on dialysis (HCC) 12/06/2014  . Pulse irregularity 09/13/2014  . History of stroke 04/15/2014  . Nail disease 01/22/2014  . CVD (cerebrovascular disease) 12/29/2013  . Acute ischemic left ACA stroke (HCC) 12/29/2013  . Abnormality of gait 12/22/2013  . Exertional angina (HCC) 06/24/2013  . Venereal disease, unspecified 11/10/2012  . ESRD on dialysis (HCC) 07/09/2012  . Left-sided weakness 07/02/2012  . Subclavian artery stenosis (HCC) 03/26/2012  . Left leg weakness 02/04/2012  . Itching 02/04/2012  .  Mouth abscess 11/11/2011  . Increased prostate specific antigen (PSA) velocity 11/11/2011  . Right ankle pain 09/10/2011  . S/P coronary artery stent placement, PTCA/DES Resolute to mid-LAD via the LIMA graft, and PTCA of apical 95% stenosis 06/2011 07/04/2011  . History of diastolic dysfunction, grade 2 by echo 07/03/2011  . S/P PTCA (percutaneous transluminal coronary angioplasty), 06/30/11 07/01/2011  . Stable angina: with acute T wave inversion.And unstable angina 06/30/2011  . Impaired glucose tolerance 05/13/2011  . 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 Preventative health care 05/10/2011  . S/P CABG x 4: 04/01/11-(LIMA to LAD,  SVG to PL branch of RCA, Sequential SVG to OM1/2) 04/07/2011  . CAD, 3 vessel significant disease at cath this admission. underwent CABG in Jan 2013 03/28/2011  . Gout 03/27/2011  . Anemia 05/22/2008  . PERIPHERAL VASCULAR DISEASE 05/22/2008  . ACHALASIA 05/22/2008  . GERD 05/22/2008  . Hyperlipidemia 10/25/2006  . Depression 10/25/2006  . Essential hypertension 10/25/2006  . HEMORRHOIDS 10/25/2006  . ALLERGIC RHINITIS 10/25/2006  . DEGENERATIVE JOINT DISEASE, SHOULDER 10/25/2006  . Headache(784.0) 10/25/2006  . CVA (cerebral infarction) 10/25/2006  . RENAL CALCULUS, HX OF 10/25/2006    Past Surgical History:  Procedure Laterality Date  . ARCH AORTOGRAM N/A 02/19/2012   Procedure: ARCH AORTOGRAM;  Surgeon: Fransisco Hertz, MD;  Location: [facility] CATH LAB;  Service: Cardiovascular;  Laterality: N/A;  . AV FISTULA PLACEMENT  12/02/2011   Procedure: ARTERIOVENOUS (AV) FISTULA CREATION;  Surgeon: Fransisco Hertz, MD;  Location: [facility] OR;  Service: Vascular;  Laterality: Left;  Ultrasound Guided  . AV FISTULA PLACEMENT Left 06/08/2012   Procedure: ARTERIOVENOUS (AV) FISTULA CREATION;  Surgeon: Fransisco Hertz, MD;  Location: [facility] OR;  Service: Vascular;  Laterality: Left;  . BASCILIC VEIN TRANSPOSITION Left 09/06/2014   Procedure: FIRST STAGE BASILIC VEIN TRANSPOSITION ;  Surgeon: Fransisco Hertz, MD;  Location: [facility] OR;  Service: Vascular;  Laterality: Left;  . BASCILIC VEIN TRANSPOSITION Left 12/06/2014   Procedure: SECOND STAGE BRACHIAL VEIN TRANSPOSITION ;  Surgeon: Fransisco Hertz, MD;  Location: [facility] OR;  Service: Vascular;  Laterality: Left;  . CARDIAC CATHETERIZATION    . COLONOSCOPY    . CORONARY ANGIOPLASTY     06/30/11   AT [facility]  . CORONARY ARTERY BYPASS GRAFT  04/01/2011   Procedure: CORONARY ARTERY BYPASS GRAFTING (CABG);  Surgeon: Kathlee Nations Suann Larry, MD;  Location: [facility] OR;  Service: Open Heart Surgery;  Laterality: N/A;  . HERNIA REPAIR  2013  . INSERTION OF DIALYSIS CATHETER  04/01/2011   Procedure:  INSERTION OF DIALYSIS CATHETER;  Surgeon: Nilda Simmer, MD;  Location: [facility] OR;  Service: Vascular;  Laterality: N/A;  . KNEE SURGERY Right 01/07/2011   arthroscopy  . LEFT HEART CATH AND CORS/GRAFTS ANGIOGRAPHY N/A 03/05/2017   Procedure: LEFT HEART CATH AND CORS/GRAFTS ANGIOGRAPHY;  Surgeon: Runell Gess, MD;  Location: [facility] INVASIVE CV LAB;  Service: Cardiovascular;  Laterality: N/A;  . LEFT HEART CATHETERIZATION WITH CORONARY ANGIOGRAM N/A 03/28/2011   Procedure: LEFT HEART CATHETERIZATION WITH CORONARY ANGIOGRAM;  Surgeon: Lennette Bihari, MD;  Location: [facility] CATH LAB;  Service: Cardiovascular;  Laterality: N/A;  pending creatnine  . LEFT HEART CATHETERIZATION WITH CORONARY/GRAFT ANGIOGRAM N/A 06/30/2011   Procedure: LEFT HEART CATHETERIZATION WITH Isabel Caprice;  Surgeon: Thurmon Fair, MD;  Location: [facility] CATH LAB;  Service: Cardiovascular;  Laterality: N/A;  . LIGATION OF ARTERIOVENOUS  FISTULA Left 06/08/2012   Procedure: LIGATION OF ARTERIOVENOUS  FISTULA;  Surgeon: Arlys John  Rolena Infante, MD;  Location: [facility] OR;  Service: Vascular;  Laterality: Left;  . NM MYOCAR PERF EJECTION FRACTION  06/25/2011   There is evidence of mild ischemia in the basal inferolateral and mid inferolateral regions. (Extent 7 %) The post-stress ejection fraction is 44%. There is mild hypocontractility in the distal inferoapical segment. baseline T wave inversion is noted in leads I, avL, II, V2. This is a low risk scan. The present perfusion study suggests significant myocardial salvage with mild residual ischemia .  Marland Kitchen PERCUTANEOUS CORONARY INTERVENTION-BALLOON ONLY  06/30/2011   Procedure: PERCUTANEOUS CORONARY INTERVENTION-BALLOON ONLY;  Surgeon: Thurmon Fair, MD;  Location: [facility] CATH LAB;  Service: Cardiovascular;;  . PERCUTANEOUS CORONARY STENT INTERVENTION (PCI-S) N/A 07/04/2011   Procedure: PERCUTANEOUS CORONARY STENT INTERVENTION (PCI-S);  Surgeon: Lennette Bihari, MD;  Location: [facility] CATH LAB;  Service:  Cardiovascular;  Laterality: N/A;  . REVISION OF ARTERIOVENOUS GORETEX GRAFT Left 12/06/2014   Procedure: REVISION OF ARTERIOVENOUS GORETEX GRAFT USING 67mmx10cm;  Surgeon: Fransisco Hertz, MD;  Location: [facility] OR;  Service: Vascular;  Laterality: Left;  . STOMACH SURGERY  2009   achalasia  . SUBCLAVIAN STENT PLACEMENT  12/20/2011  . TONSILLECTOMY    . TONSILLECTOMY          Home Medications    Prior to Admission medications   Medication Sig Start Date End Date Taking? Authorizing Provider  amLODipine (NORVASC) 5 MG tablet TAKE ONE TABLET BY MOUTH ONE TIME DAILY  12/08/16   Runell Gess, MD  aspirin EC 81 MG EC tablet Take 1 tablet (81 mg total) by mouth daily. 07/07/11   Dwana Melena, PA-C  atorvastatin (LIPITOR) 40 MG tablet Take 1 tablet (40 mg total) by mouth daily. 05/12/17 08/10/17  Corwin Levins, MD  cloNIDine (CATAPRES) 0.2 MG tablet Take 1 tablet (0.2 mg total) by mouth 2 (two) times daily. 03/19/17   Jodelle Gross, NP  clopidogrel (PLAVIX) 75 MG tablet Take 1 tablet (75 mg total) by mouth daily. 03/19/17   Jodelle Gross, NP  doxercalciferol (HECTOROL) 4 MCG/2ML injection Inject 2.5 mLs (5 mcg total) into the vein every Monday, Wednesday, and Friday with hemodialysis. 03/06/17   Lonia Blood, MD  febuxostat (ULORIC) 40 MG tablet Take 40 mg by mouth daily as needed (gout).  05/13/11   Corwin Levins, MD  ferric gluconate 125 mg in sodium chloride 0.9 % 100 mL Inject 125 mg into the vein every Monday, Wednesday, and Friday with hemodialysis. 03/06/17   Lonia Blood, MD  ketoconazole (NIZORAL) 2 % cream Apply 1 application topically daily. 12/18/16   Vivi Barrack, DPM  lanthanum ([name]) 1000 MG chewable tablet Chew 2 tablets (2,000 mg total) by mouth 3 (three) times daily with meals. Yearly physical due in July must see MD for refills 09/10/15   Corwin Levins, MD  lidocaine-prilocaine (EMLA) cream Apply 1 application topically as needed. Apply as needed for  dialysis site    [provider]  memantine (NAMENDA) 10 MG tablet Take 1 tablet (10 mg total) by mouth 2 (two) times daily. 05/12/17   Corwin Levins, MD  metoprolol tartrate (LOPRESSOR) 50 MG tablet Take 1 tablet (50 mg total) by mouth 2 (two) times daily. 05/12/17   Corwin Levins, MD  nitroGLYCERIN (NITROSTAT) 0.4 MG SL tablet PLACE 1 TABLET UNDER TONGUE AS NEEDED FOR CHEST PAIN EVERY 5 MINUTES (MAX 3 DOSES) 05/12/17   Corwin Levins, MD  terazosin (HYTRIN) 10  MG capsule TAKE 1 CAPSULE BY MOUTH DAILY AT BEDTIME 05/12/17   Corwin Levins, MD  zolpidem (AMBIEN) 10 MG tablet Take 1 tablet (10 mg total) by mouth at bedtime as needed. for sleep 05/12/17   Corwin Levins, MD    Family History Family History  Problem Relation Age of Onset  . Cancer Mother   . Heart disease Mother   . Hyperlipidemia Mother   . Hypertension Mother   . Alzheimer's disease Mother   . Cancer Father   . Heart disease Father   . Hypertension Other   . Stroke Other   . Hyperlipidemia Other     Social History Social History   Tobacco Use  . Smoking status: Never Smoker  . Smokeless tobacco: Former Neurosurgeon    Types: Chew  . Tobacco comment: chewed tobacco   Substance Use Topics  . Alcohol use: Yes    Alcohol/week: 1.2 oz    Types: 2 Shots of liquor per week    Comment: rarely; beer  . Drug use: No     Allergies   Shrimp [shellfish allergy]; Atorvastatin; Insulins; Simvastatin; and Ultram [tramadol]   Review of Systems Review of Systems  Unable to perform ROS: Mental status change     Physical Exam Updated Vital Signs BP (!) 143/73   Resp 18  SPO2 96%  Physical Exam  Constitutional: He appears well-developed and well-nourished. He appears lethargic.  HENT:  Head: Normocephalic and atraumatic.  Right Ear: External ear normal.  Left Ear: External ear normal.  Nose: Nose normal.  Eyes: Right eye exhibits no discharge. Left eye exhibits no discharge.  Neck: Neck supple.  Cardiovascular:  Normal rate, regular rhythm and normal heart sounds.  Pulmonary/Chest: Effort normal and breath sounds normal.  Abdominal: Soft. There is no tenderness.  Musculoskeletal: He exhibits no edema.  Neurological: He appears lethargic.  Facial droop, right sided. Unable to understand his speech. Right sided flaccidity. Left side with strength but doesn't follow commands well.  Skin: Skin is warm and dry. He is not diaphoretic.  Nursing note and vitals reviewed.    ED Treatments / Results  Labs (all labs ordered are listed, but only abnormal results are displayed) Labs Reviewed  APTT - Abnormal; Notable for the following components:      Result Value   aPTT 22 (*)    All other components within normal limits  CBC - Abnormal; Notable for the following components:   WBC 14.8 (*)    RBC 4.21 (*)    HCT 38.9 (*)    All other components within normal limits  DIFFERENTIAL - Abnormal; Notable for the following components:   Neutro Abs 12.7 (*)    All other components within normal limits  COMPREHENSIVE METABOLIC PANEL - Abnormal; Notable for the following components:   Chloride 94 (*)    CO2 20 (*)    Glucose, Bld 135 (*)    BUN 39 (*)    Creatinine, Ser 14.46 (*)    Calcium 10.7 (*)    ALT 9 (*)    GFR calc non Af Amer 3 (*)    GFR calc Af Amer 4 (*)    Anion gap 22 (*)    All other components within normal limits  CBG MONITORING, ED - Abnormal; Notable for the following components:   Glucose-Capillary 125 (*)    All other components within normal limits  I-STAT CHEM 8, ED - Abnormal; Notable for the following components:   Sodium  133 (*)    Chloride 98 (*)    BUN 48 (*)    Creatinine, Ser 14.80 (*)    Glucose, Bld 130 (*)    Calcium, Ion 1.09 (*)    TCO2 21 (*)    Hemoglobin 12.6 (*)    HCT 37.0 (*)    All other components within normal limits  PROTIME-INR  HEMOGLOBIN A1C  LIPID PANEL  I-STAT TROPONIN, ED    EKG None  Radiology Ct Angio Head W Or Wo  Contrast  Result Date: 07/05/2017 CLINICAL DATA:  Right hemiparesis and difficulty speaking. EXAM: CT ANGIOGRAPHY HEAD AND NECK CT PERFUSION BRAIN TECHNIQUE: Multidetector CT imaging of the head and neck was performed using the standard protocol during bolus administration of intravenous contrast. Multiplanar CT image reconstructions and MIPs were obtained to evaluate the vascular anatomy. Carotid stenosis measurements (when applicable) are obtained utilizing NASCET criteria, using the distal internal carotid diameter as the denominator. Multiphase CT imaging of the brain was performed following IV bolus contrast injection. Subsequent parametric perfusion maps were calculated using RAPID software. CONTRAST:  ISOVUE-370 IOPAMIDOL (ISOVUE-370) INJECTION 76% COMPARISON:  Head MRA 12/29/2013.  Neck MRA 06/30/2005 FINDINGS: CTA NECK FINDINGS Aortic arch: Standard 3 vessel aortic arch. Widely patent arch vessel origins. Right carotid system: Patent with predominantly soft plaque in the common carotid artery resulting in less than 50% narrowing. Minimal calcified plaque at the carotid bifurcation. No ICA stenosis. Left carotid system: Patent with predominantly soft plaque in the distal common carotid artery resulting in 50% stenosis. Moderate calcified plaque about the carotid bifurcation. No ICA stenosis. Vertebral arteries: Patent with the left being slightly dominant. Mild proximal right V2 stenosis due to calcified plaque. Calcified and soft plaque in the left V1 segment with mild multifocal stenoses. Skeleton: Mild cervical disc degeneration. Other neck: Multiple thyroid nodules including a 2.9 cm exophytic. Nodule extending inferiorly from the isthmus. Upper chest: Clear lung apices. Review of the MIP images confirms the above findings CTA HEAD FINDINGS Anterior circulation: The internal carotid arteries are patent from skull base to carotid termini with calcified plaque resulting in mild cavernous and  supraclinoid stenosis on the right. The M1 segments are patent with mild stenosis on the right. The left M2 inferior division is occluded at its origin with some reconstitution of more distal M2 and M3 branches, however there are numerous missing distal branches in the left parietal region corresponding to the acute infarct. The prior MRA also suggested a severe stenosis or possibly short segment occlusion of the left M2 inferior division, however motion artifact on that study makes it difficult to make a detailed assessment of interval changes. There are also likely some missing distal left MCA superior division branch vessels. There also severe proximal right M2 stenoses which are at least partially chronic. The right A1 segment is occluded. The left A1 segment is patent but demonstrates a severe proximal stenosis. Both A2 segments are heavily diseased with multifocal severe stenoses and or short segment occlusions. The appearance of the ACAs is grossly similar to the prior motion degraded MRA. No aneurysm. Posterior circulation: The intracranial vertebral arteries are patent to the basilar. There is right greater than left V4 segment calcified plaque without significant stenosis. Patent SCA origins are identified bilaterally. PICA and AICA origins are not well evaluated. The basilar artery is widely patent. Posterior communicating arteries are diminutive or absent. PCAs are patent with mild left P2 stenosis. No aneurysm. Venous sinuses: Inadequately assessed due to arterial phase  contrast timing. Anatomic variants: None. Delayed phase: Not performed. Review of the MIP images confirms the above findings CT Brain Perfusion Findings: CBF (<30%) Volume: 28mL Perfusion (Tmax>6.0s) volume: , however a substantial amount of this is located in the right cerebral hemisphere Mismatch Volume: though with the caveat that this is artifactually inflated due to (likely chronic) prolonged perfusion in the right  cerebral hemisphere. There is still however an apparent sizable penumbra in the posterior left cerebral hemisphere. Infarction Location:Predominantly left parietal lobe with a small amount of posterior left frontal lobe, posterior left temporal lobe, and posterior insula involvement. IMPRESSION: 1. 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 2. Left M2 inferior division occlusion at its origin with some distal reconstitution. Severe stenosis (or possibly short segment occlusion) in this location on a prior MRA. Numerous missing distal branch vessels in the left parietal region corresponding to the acute infarct. 3. Advanced intracranial atherosclerosis with numerous severe stenoses as above. 4. 50% left common carotid artery stenosis. 5.  Aortic Atherosclerosis (ICD10-I70.0). 6. Thyroid nodules measuring up to 2.9 cm. Nonemergent thyroid ultrasound recommended for further evaluation. These results were communicated to Dr. Amada Jupiter at 10:18 pm on 07/05/2017 by text page via the [facility] messaging system. Electronically Signed   By: Sebastian Ache M.D.   On: 07/05/2017 22:47   Ct Angio Neck W Or Wo Contrast  Result Date: 07/05/2017 CLINICAL DATA:  Right hemiparesis and difficulty speaking. EXAM: CT ANGIOGRAPHY HEAD AND NECK CT PERFUSION BRAIN TECHNIQUE: Multidetector CT imaging of the head and neck was performed using the standard protocol during bolus administration of intravenous contrast. Multiplanar CT image reconstructions and MIPs were obtained to evaluate the vascular anatomy. Carotid stenosis measurements (when applicable) are obtained utilizing NASCET criteria, using the distal internal carotid diameter as the denominator. Multiphase CT imaging of the brain was performed following IV bolus contrast injection. Subsequent parametric perfusion maps were calculated using RAPID software. CONTRAST:  ISOVUE-370 IOPAMIDOL (ISOVUE-370) INJECTION 76% COMPARISON:  Head MRA 12/29/2013.  Neck MRA  06/30/2005 FINDINGS: CTA NECK FINDINGS Aortic arch: Standard 3 vessel aortic arch. Widely patent arch vessel origins. Right carotid system: Patent with predominantly soft plaque in the common carotid artery resulting in less than 50% narrowing. Minimal calcified plaque at the carotid bifurcation. No ICA stenosis. Left carotid system: Patent with predominantly soft plaque in the distal common carotid artery resulting in 50% stenosis. Moderate calcified plaque about the carotid bifurcation. No ICA stenosis. Vertebral arteries: Patent with the left being slightly dominant. Mild proximal right V2 stenosis due to calcified plaque. Calcified and soft plaque in the left V1 segment with mild multifocal stenoses. Skeleton: Mild cervical disc degeneration. Other neck: Multiple thyroid nodules including a 2.9 cm exophytic. Nodule extending inferiorly from the isthmus. Upper chest: Clear lung apices. Review of the MIP images confirms the above findings CTA HEAD FINDINGS Anterior circulation: The internal carotid arteries are patent from skull base to carotid termini with calcified plaque resulting in mild cavernous and supraclinoid stenosis on the right. The M1 segments are patent with mild stenosis on the right. The left M2 inferior division is occluded at its origin with some reconstitution of more distal M2 and M3 branches, however there are numerous missing distal branches in the left parietal region corresponding to the acute infarct. The prior MRA also suggested a severe stenosis or possibly short segment occlusion of the left M2 inferior division, however motion artifact on that study makes it difficult to make a detailed assessment of  interval changes. There are also likely some missing distal left MCA superior division branch vessels. There also severe proximal right M2 stenoses which are at least partially chronic. The right A1 segment is occluded. The left A1 segment is patent but demonstrates a severe proximal  stenosis. Both A2 segments are heavily diseased with multifocal severe stenoses and or short segment occlusions. The appearance of the ACAs is grossly similar to the prior motion degraded MRA. No aneurysm. Posterior circulation: The intracranial vertebral arteries are patent to the basilar. There is right greater than left V4 segment calcified plaque without significant stenosis. Patent SCA origins are identified bilaterally. PICA and AICA origins are not well evaluated. The basilar artery is widely patent. Posterior communicating arteries are diminutive or absent. PCAs are patent with mild left P2 stenosis. No aneurysm. Venous sinuses: Inadequately assessed due to arterial phase contrast timing. Anatomic variants: None. Delayed phase: Not performed. Review of the MIP images confirms the above findings CT Brain Perfusion Findings: CBF (<30%) Volume: 28mL Perfusion (Tmax>6.0s) volume: , however a substantial amount of this is located in the right cerebral hemisphere Mismatch Volume: though with the caveat that this is artifactually inflated due to (likely chronic) prolonged perfusion in the right cerebral hemisphere. There is still however an apparent sizable penumbra in the posterior left cerebral hemisphere. Infarction Location:Predominantly left parietal lobe with a small amount of posterior left frontal lobe, posterior left temporal lobe, and posterior insula involvement. IMPRESSION: 1. 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 2. Left M2 inferior division occlusion at its origin with some distal reconstitution. Severe stenosis (or possibly short segment occlusion) in this location on a prior MRA. Numerous missing distal branch vessels in the left parietal region corresponding to the acute infarct. 3. Advanced intracranial atherosclerosis with numerous severe stenoses as above. 4. 50% left common carotid artery stenosis. 5.  Aortic Atherosclerosis (ICD10-I70.0). 6. Thyroid nodules  measuring up to 2.9 cm. Nonemergent thyroid ultrasound recommended for further evaluation. These results were communicated to Dr. Amada Jupiter at 10:18 pm on 07/05/2017 by text page via the [facility] messaging system. Electronically Signed   By: Sebastian Ache M.D.   On: 07/05/2017 22:47   Ct Cerebral Perfusion W Contrast  Result Date: 07/05/2017 CLINICAL DATA:  Right hemiparesis and difficulty speaking. EXAM: CT ANGIOGRAPHY HEAD AND NECK CT PERFUSION BRAIN TECHNIQUE: Multidetector CT imaging of the head and neck was performed using the standard protocol during bolus administration of intravenous contrast. Multiplanar CT image reconstructions and MIPs were obtained to evaluate the vascular anatomy. Carotid stenosis measurements (when applicable) are obtained utilizing NASCET criteria, using the distal internal carotid diameter as the denominator. Multiphase CT imaging of the brain was performed following IV bolus contrast injection. Subsequent parametric perfusion maps were calculated using RAPID software. CONTRAST:  ISOVUE-370 IOPAMIDOL (ISOVUE-370) INJECTION 76% COMPARISON:  Head MRA 12/29/2013.  Neck MRA 06/30/2005 FINDINGS: CTA NECK FINDINGS Aortic arch: Standard 3 vessel aortic arch. Widely patent arch vessel origins. Right carotid system: Patent with predominantly soft plaque in the common carotid artery resulting in less than 50% narrowing. Minimal calcified plaque at the carotid bifurcation. No ICA stenosis. Left carotid system: Patent with predominantly soft plaque in the distal common carotid artery resulting in 50% stenosis. Moderate calcified plaque about the carotid bifurcation. No ICA stenosis. Vertebral arteries: Patent with the left being slightly dominant. Mild proximal right V2 stenosis due to calcified plaque. Calcified and soft plaque in the left V1 segment with mild multifocal stenoses. Skeleton: Mild cervical  disc degeneration. Other neck: Multiple thyroid nodules including a 2.9 cm  exophytic. Nodule extending inferiorly from the isthmus. Upper chest: Clear lung apices. Review of the MIP images confirms the above findings CTA HEAD FINDINGS Anterior circulation: The internal carotid arteries are patent from skull base to carotid termini with calcified plaque resulting in mild cavernous and supraclinoid stenosis on the right. The M1 segments are patent with mild stenosis on the right. The left M2 inferior division is occluded at its origin with some reconstitution of more distal M2 and M3 branches, however there are numerous missing distal branches in the left parietal region corresponding to the acute infarct. The prior MRA also suggested a severe stenosis or possibly short segment occlusion of the left M2 inferior division, however motion artifact on that study makes it difficult to make a detailed assessment of interval changes. There are also likely some missing distal left MCA superior division branch vessels. There also severe proximal right M2 stenoses which are at least partially chronic. The right A1 segment is occluded. The left A1 segment is patent but demonstrates a severe proximal stenosis. Both A2 segments are heavily diseased with multifocal severe stenoses and or short segment occlusions. The appearance of the ACAs is grossly similar to the prior motion degraded MRA. No aneurysm. Posterior circulation: The intracranial vertebral arteries are patent to the basilar. There is right greater than left V4 segment calcified plaque without significant stenosis. Patent SCA origins are identified bilaterally. PICA and AICA origins are not well evaluated. The basilar artery is widely patent. Posterior communicating arteries are diminutive or absent. PCAs are patent with mild left P2 stenosis. No aneurysm. Venous sinuses: Inadequately assessed due to arterial phase contrast timing. Anatomic variants: None. Delayed phase: Not performed. Review of the MIP images confirms the above findings CT  Brain Perfusion Findings: CBF (<30%) Volume: 28mL Perfusion (Tmax>6.0s) volume: , however a substantial amount of this is located in the right cerebral hemisphere Mismatch Volume: though with the caveat that this is artifactually inflated due to (likely chronic) prolonged perfusion in the right cerebral hemisphere. There is still however an apparent sizable penumbra in the posterior left cerebral hemisphere. Infarction Location:Predominantly left parietal lobe with a small amount of posterior left frontal lobe, posterior left temporal lobe, and posterior insula involvement. IMPRESSION: 1. 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 2. Left M2 inferior division occlusion at its origin with some distal reconstitution. Severe stenosis (or possibly short segment occlusion) in this location on a prior MRA. Numerous missing distal branch vessels in the left parietal region corresponding to the acute infarct. 3. Advanced intracranial atherosclerosis with numerous severe stenoses as above. 4. 50% left common carotid artery stenosis. 5.  Aortic Atherosclerosis (ICD10-I70.0). 6. Thyroid nodules measuring up to 2.9 cm. Nonemergent thyroid ultrasound recommended for further evaluation. These results were communicated to Dr. Amada Jupiter at 10:18 pm on 07/05/2017 by text page via the [facility] messaging system. Electronically Signed   By: Sebastian Ache M.D.   On: 07/05/2017 22:47   Ct Head Code Stroke Wo Contrast  Result Date: 07/05/2017 CLINICAL DATA:  Code stroke. Right hemiparesis and difficulty speaking. EXAM: CT HEAD WITHOUT CONTRAST TECHNIQUE: Contiguous axial images were obtained from the base of the skull through the vertex without intravenous contrast. COMPARISON:  Brain MRI 04/27/2016 FINDINGS: Brain: There is loss of gray-white differentiation consistent with acute infarction involving the posterior left insula and left parietal lobe extending towards the vertex. No acute intracranial  hemorrhage, mass, midline shift,  or extra-axial fluid collection is identified. Chronic lacunar infarcts are present in the basal ganglia and thalami bilaterally. There is a chronic right temporoparietal infarct. There is a small chronic left cerebellar infarct. Patchy cerebral white matter hypodensities are nonspecific but compatible with chronic small vessel ischemic disease, moderately advanced for age. Mild cerebral atrophy is advanced for age. Vascular: Calcified atherosclerosis at the skull base. No hyperdense vessel. Skull: No fracture or focal osseous lesion. Sinuses/Orbits: Visualized paranasal sinuses and mastoid air cells are clear. Orbits are unremarkable. Other: None. ASPECTS [facility] Stroke Program Early CT Score) - Ganglionic level infarction (caudate, lentiform nuclei, internal capsule, insula, M1-M3 cortex): 5-6 - Supraganglionic infarction (M4-M6 cortex): 2 Total score (0-10 with 10 being normal): 7-8 IMPRESSION: 1. Acute left MCA infarct involving the insula and parietal lobe. No hemorrhage. 2. ASPECTS is 7-8. 3. Age advanced chronic ischemia. These results were communicated to Dr. Amada Jupiter at 9:54 pm on 07/05/2017 by text page via the [facility] messaging system. Electronically Signed   By: Sebastian Ache M.D.   On: 07/05/2017 21:59    Procedures .Critical Care Performed by: Pricilla Loveless, MD Authorized by: Pricilla Loveless, MD   Critical care provider statement:    Critical care time (minutes):  30   Critical care time was exclusive of:  Separately billable procedures and treating other patients   Critical care was necessary to treat or prevent imminent or life-threatening deterioration of the following conditions:  CNS failure or compromise   Critical care was time spent personally by me on the following activities:  Development of treatment plan with patient or surrogate, discussions with consultants, evaluation of patient's response to treatment, examination of patient, obtaining  history from patient or surrogate, ordering and performing treatments and interventions, ordering and review of laboratory studies, ordering and review of radiographic studies, pulse oximetry, re-evaluation of patient's condition and review of old charts   (including critical care time)  Medications Ordered in ED Medications  iopamidol (ISOVUE-370) 76 % injection (has no administration in time range)   stroke: mapping our early stages of recovery book (has no administration in time range)  0.9 %  sodium chloride infusion ( Intravenous New Bag/Given 07/05/17 2247)  acetaminophen (TYLENOL) tablet 650 mg (has no administration in time range)    Or  acetaminophen (TYLENOL) solution 650 mg (has no administration in time range)    Or  acetaminophen (TYLENOL) suppository 650 mg (has no administration in time range)  tirofiban (AGGRASTAT) 5-0.9 MG/100ML-% injection (has no administration in time range)  ticagrelor (BRILINTA) 90 MG tablet (has no administration in time range)  aspirin 325 MG tablet (has no administration in time range)  clopidogrel (PLAVIX) 300 MG tablet (has no administration in time range)  lidocaine (XYLOCAINE) 1 % (with pres) injection (has no administration in time range)  nitroGLYCERIN 100 MCG/ML intra-arterial injection (has no administration in time range)  ceFAZolin (ANCEF) 2-4 GM/100ML-% IVPB (has no administration in time range)  iopamidol (ISOVUE-370) 76 % injection 100 mL (100 mLs Intravenous Contrast Given 07/05/17 2154)  fentaNYL (SUBLIMAZE) 100 MCG/2ML injection (  Override pull for Anesthesia 07/05/17 2320)  iopamidol (ISOVUE-300) 61 % injection (75 mLs  Contrast Given 07/06/17 0013)  eptifibatide (INTEGRILIN) 20 MG/10ML injection (1.8 mg Intra-arterial Given 07/06/17 0009)  cisatracurium (NIMBEX) injection 10 mg (4 mg Intravenous Given 07/05/17 2348)  iopamidol (ISOVUE-300) 61 % injection (15 mLs  Contrast Given 07/06/17 0021)     Initial Impression / Assessment and  Plan / ED Course  I  have reviewed the triage vital signs and the nursing notes.  Pertinent labs & imaging results that were available during my care of the patient were reviewed by me and considered in my medical decision making (see chart for details).     Patient was taken straight to the CT scan.  I placed an IV given poor IV access.  He is outside of the code stroke window given poor last known well but CT angiography shows acute M2 occlusion.  Dr. Amada Jupiter has discussed with interventional radiology and they will decide to take patient to the IR suite for emergent clot retrieval.  The patient is significantly ill but at this time protecting his airway.  Admit to neurology.  Final Clinical Impressions(s) / ED Diagnoses   Final diagnoses:  Stroke [facility])  Stroke (cerebrum) (HCC)  Cerebrovascular accident (CVA) due to embolism of middle cerebral artery, unspecified blood vessel laterality [facility])    ED Discharge Orders    None       Pricilla Loveless, MD 07/06/17 [id number]

## 2017-07-05 NOTE — H&P (Addendum)
Neurology history and physical  CC: Rigth sided weakness  History is obtained from:patient  HPI: [name] is a 57 y.o. male with a history of ESRD, previous stroke who does live alone presenting with signs of L MCA syndrome. He was alst seen well on Friday, but at 6pm someone talked to him through a door. He said he was getting up to come to the door, but never made it. Unclear if he was actually well at that time.   He is a dialysis patient Monday Wednesday Friday and therefore will need dialysis tomorrow.  LKW: Friday tpa given?: no, out of window.  Modified Rankin Scale: 2-Slight disability-UNABLE to perform all activities but does not need assistance  ROS: Unable to obtain due to altered mental status.   Past Medical History:  Diagnosis Date  . ACHALASIA   . ANEMIA-NOS   . CAD (coronary artery disease), 3 vessel significant disease.    . Cardiomyopathy (HCC) 05/12/2017  . CEREBROVASCULAR ACCIDENT, HX OF   . CHF (congestive heart failure) (HCC)   . CKD (chronic kidney disease) stage 4, GFR 15-29 ml/min (HCC)   . Constipation   . DEGENERATIVE JOINT DISEASE, SHOULDER    knee  . DEPRESSION    2006- not depressed any longer  . Family history of adverse reaction to anesthesia    Mother, hard to awaken  . GERD    no meds required  . Gout    takes Uloric daily  . H/O hiatal hernia   . Headache(784.0)   . Heart attack (HCC)   . History of colon polyps   . History of diastolic dysfunction, grade 2 by echo   . HYPERLIPIDEMIA    takes crestor daily  . Hyperlipidemia   . HYPERTENSION    takes Norvasc,Catapress,Hydralazine,Imdur,and Metoprolol daily  . Impaired glucose tolerance   . Insomnia    takes Ambien nightly  . NSTEMI (non-ST elevated myocardial infarction), 06/30/11 07/01/2011  . RENAL CALCULUS, HX OF   . RENAL INSUFFICIENCY    12/2013- TThSat  . S/P coronary artery stent placement, PTCA/DES Resolute to mid-LAD via the LIMA graft, and PTCA of apical 95%  stenosis 07/05/11 07/04/2011  . S/P PTCA (percutaneous transluminal coronary angioplasty), 06/30/11 07/01/2011  . Seizures (HCC)    INSULIN INDUCED  . Sleep apnea    SLEEP STUDY IN [place] , NO MACHINE YET  1 YEAR  . Stroke (HCC)    93/2005/2006/2007;left sided weakness.  1993 12/2013  . Thyroid disease      Family History  Problem Relation Age of Onset  . Cancer Mother   . Heart disease Mother   . Hyperlipidemia Mother   . Hypertension Mother   . Alzheimer's disease Mother   . Cancer Father   . Heart disease Father   . Hypertension Other   . Stroke Other   . Hyperlipidemia Other      Social History:  reports that he has never smoked. He quit smokeless tobacco use about 14 years ago. His smokeless tobacco use included chew. He reports that he drinks about 1.2 oz of alcohol per week. He reports that he does not use drugs.   Exam: Current vital signs: There were no vitals taken for this visit. Vital signs in last 24 hours:    Physical Exam  Constitutional: Appears well-developed and well-nourished.  Psych: Affect appropriate to situation Eyes: No scleral injection HENT: No OP obstrucion Head: Normocephalic.  Cardiovascular: Normal rate and regular rhythm.  Respiratory: Effort normal  and breath sounds normal to anterior ascultation GI: Soft.  No distension. There is no tenderness.  Skin: WDI  Neuro: Mental Status: Patient is drowsy but easily arousable, he is densely aphasic Cranial Nerves: II: Does not blink to threat from the right, does from the left pupils are equal, round, and reactive to light.   III,IV, VI: He has a left gaze preference V: Does not respond as much on the right is left VII: Facial movement is weak on the right Motor: He has flicker of movement in the right arm flexion to pain with the right leg, moves his left side voluntarily and well. Sensory: He does respond to noxious stimuli in all 4 extremities, the less on the right than the  left Cerebellar: He does not perform  I have reviewed labs in epic and the results pertinent to this consultation are: Potassium-normal sodium 133 Creatinine 14.8  I have reviewed the images obtained:CT P- large area of penumbra with smaller area of infarct.   Primary Diagnosis:  Cerebral infarction due to embolism of right middle cerebral artery  Secondary Diagnosis: ESRD   Impression: 57 year old male with multiple previous strokes who presents with right-sided hemiplegia and aphasia in the setting of M2 occlusion on the left.  He is being taken for interventional radiology for possible embolectomy.  Unfortunately, his last known well is unclear and therefore he is not a candidate for IV TPA.  Recommendations: 1. HgbA1c, fasting lipid panel 2. MRI of the brain without contrast 3. Frequent neuro checks 4. Echocardiogram 5. Prophylactic therapy-Antiplatelet med: Aspirin - dose 325mg  PO or 300mg  PR also will continue home Plavix once able to take it p.o. 6. Risk factor modification 7. Telemetry monitoring 8. PT consult, OT consult, Speech consult 9.  Nephrology will need to be consulted for end-stage renal disease. 10.  Continue home Lipitor  This patient is critically ill and at significant risk of neurological worsening, death and care requires constant monitoring of vital signs, hemodynamics,respiratory and cardiac monitoring, neurological assessment, discussion with family, other specialists and medical decision making of high complexity. I spent 60 minutes of neurocritical care time  in the care of  this patient.  Ritta Slot, MD [facility] [phone]  If 7pm- 7am, please page neurology on call as listed in AMION. 07/05/2017  9:44 PM

## 2017-07-05 NOTE — Anesthesia Procedure Notes (Signed)
Procedure Name: Intubation Date/Time: 07/05/2017 11:10 PM Performed by: Edmonia Caprio, CRNA Pre-anesthesia Checklist: Patient identified, Emergency Drugs available, Suction available, Patient being monitored and Timeout performed Patient Re-evaluated:Patient Re-evaluated prior to induction Oxygen Delivery Method: Circle system utilized Preoxygenation: Pre-oxygenation with 100% oxygen Induction Type: IV induction and Rapid sequence Laryngoscope Size: Miller and 2 Grade View: Grade I Tube type: Subglottic suction tube Tube size: 7.5 mm Number of attempts: 1 Airway Equipment and Method: Stylet Placement Confirmation: ETT inserted through vocal cords under direct vision,  positive ETCO2 and breath sounds checked- equal and bilateral Secured at: 21 cm Tube secured with: Tape Dental Injury: Teeth and Oropharynx as per pre-operative assessment

## 2017-07-05 NOTE — ED Notes (Signed)
Pt arrived by EMS as a Code Stroke from EMS. Neighbors went to pts home to check on patient and patient was able to tell them he was coming to the door, but pt never actually came to the door. The last time pt was seen by anyone was on Friday. Pt arrives with dysphagia and R sided flaccidity. Dialysis fistula noted to L upper arm; EMS started IV in L hand.

## 2017-07-05 NOTE — Anesthesia Preprocedure Evaluation (Signed)
Anesthesia Evaluation  Patient identified by MRN, date of birth, ID band Patient confused    Reviewed: Allergy & Precautions, Patient's Chart, lab work & pertinent test results, Unable to perform ROS - Chart review only  Airway Mallampati: III       Dental  (+) Teeth Intact   Pulmonary    breath sounds clear to auscultation       Cardiovascular hypertension,  Rhythm:Regular Rate:Normal     Neuro/Psych    GI/Hepatic   Endo/Other    Renal/GU      Musculoskeletal   Abdominal   Peds  Hematology   Anesthesia Other Findings   Reproductive/Obstetrics                             Anesthesia Physical Anesthesia Plan  ASA: IV and emergent  Anesthesia Plan: General   Post-op Pain Management:    Induction: Intravenous  PONV Risk Score and Plan: Ondansetron  Airway Management Planned: Oral ETT  Additional Equipment: Arterial line  Intra-op Plan:   Post-operative Plan: Possible Post-op intubation/ventilation  Informed Consent: I have reviewed the patients History and Physical, chart, labs and discussed the procedure including the risks, benefits and alternatives for the proposed anesthesia with the patient or authorized representative who has indicated his/her understanding and acceptance.     Plan Discussed with: CRNA and Anesthesiologist  Anesthesia Plan Comments:         Anesthesia Quick Evaluation

## 2017-07-06 ENCOUNTER — Inpatient Hospital Stay (HOSPITAL_COMMUNITY): Payer: Medicare Other

## 2017-07-06 ENCOUNTER — Encounter (HOSPITAL_COMMUNITY): Payer: Self-pay | Admitting: Interventional Radiology

## 2017-07-06 ENCOUNTER — Other Ambulatory Visit: Payer: Self-pay

## 2017-07-06 DIAGNOSIS — E785 Hyperlipidemia, unspecified: Secondary | ICD-10-CM

## 2017-07-06 DIAGNOSIS — I351 Nonrheumatic aortic (valve) insufficiency: Secondary | ICD-10-CM

## 2017-07-06 DIAGNOSIS — I5022 Chronic systolic (congestive) heart failure: Secondary | ICD-10-CM

## 2017-07-06 DIAGNOSIS — I6602 Occlusion and stenosis of left middle cerebral artery: Secondary | ICD-10-CM | POA: Insufficient documentation

## 2017-07-06 HISTORY — PX: IR PERCUTANEOUS ART THROMBECTOMY/INFUSION INTRACRANIAL INC DIAG ANGIO: IMG6087

## 2017-07-06 HISTORY — PX: IR CT HEAD LTD: IMG2386

## 2017-07-06 HISTORY — PX: IR ANGIO VERTEBRAL SEL VERTEBRAL BILAT MOD SED: IMG5369

## 2017-07-06 HISTORY — PX: IR ANGIO INTRA EXTRACRAN SEL COM CAROTID INNOMINATE UNI R MOD SED: IMG5359

## 2017-07-06 LAB — CBC WITH DIFFERENTIAL/PLATELET
Band Neutrophils: 0 %
Basophils Absolute: 0 K/uL (ref 0.0–0.1)
Basophils Relative: 0 %
Blasts: 0 %
Eosinophils Absolute: 0 K/uL (ref 0.0–0.7)
Eosinophils Relative: 0 %
HCT: 32.9 % — ABNORMAL LOW (ref 39.0–52.0)
Hemoglobin: 11.4 g/dL — ABNORMAL LOW (ref 13.0–17.0)
Lymphocytes Relative: 15 %
Lymphs Abs: 1.8 K/uL (ref 0.7–4.0)
MCH: 31.6 pg (ref 26.0–34.0)
MCHC: 34.7 g/dL (ref 30.0–36.0)
MCV: 91.1 fL (ref 78.0–100.0)
Metamyelocytes Relative: 0 %
Monocytes Absolute: 1.2 K/uL — ABNORMAL HIGH (ref 0.1–1.0)
Monocytes Relative: 10 %
Myelocytes: 0 %
Neutro Abs: 9 K/uL — ABNORMAL HIGH (ref 1.7–7.7)
Neutrophils Relative %: 75 %
Other: 0 %
Platelets: 197 K/uL (ref 150–400)
Promyelocytes Relative: 0 %
RBC: 3.61 MIL/uL — ABNORMAL LOW (ref 4.22–5.81)
RDW: 14.7 % (ref 11.5–15.5)
WBC: 12 K/uL — ABNORMAL HIGH (ref 4.0–10.5)
nRBC: 0 /100{WBCs}

## 2017-07-06 LAB — BASIC METABOLIC PANEL
ANION GAP: 14 (ref 5–15)
BUN: 35 mg/dL — ABNORMAL HIGH (ref 6–20)
CO2: 18 mmol/L — ABNORMAL LOW (ref 22–32)
Calcium: 9.6 mg/dL (ref 8.9–10.3)
Chloride: 101 mmol/L (ref 101–111)
Creatinine, Ser: 13.49 mg/dL — ABNORMAL HIGH (ref 0.61–1.24)
GFR calc Af Amer: 4 mL/min — ABNORMAL LOW (ref >60)
GFR, EST NON AFRICAN AMERICAN: 4 mL/min — AB (ref >60)
Glucose, Bld: 106 mg/dL — ABNORMAL HIGH (ref 65–99)
POTASSIUM: 3.5 mmol/L (ref 3.5–5.1)
SODIUM: 133 mmol/L — AB (ref 135–145)

## 2017-07-06 LAB — LIPID PANEL
CHOLESTEROL: 180 mg/dL (ref 0–200)
HDL: 29 mg/dL — AB (ref >40)
LDL Cholesterol: 131 mg/dL — ABNORMAL HIGH (ref 0–99)
TRIGLYCERIDES: 98 mg/dL (ref <150)
Total CHOL/HDL Ratio: 6.2 RATIO
VLDL: 20 mg/dL (ref 0–40)

## 2017-07-06 LAB — MRSA PCR SCREENING: MRSA by PCR: POSITIVE — AB

## 2017-07-06 LAB — ECHOCARDIOGRAM COMPLETE
Height: 71 in
Weight: 3061.75 oz

## 2017-07-06 LAB — HEMOGLOBIN A1C
HEMOGLOBIN A1C: 4.6 % — AB (ref 4.8–5.6)
MEAN PLASMA GLUCOSE: 85.32 mg/dL

## 2017-07-06 LAB — GLUCOSE, CAPILLARY: Glucose-Capillary: 97 mg/dL (ref 65–99)

## 2017-07-06 MED ORDER — CHLORHEXIDINE GLUCONATE CLOTH 2 % EX PADS
6.0000 | MEDICATED_PAD | Freq: Every day | CUTANEOUS | Status: AC
  Administered 2017-07-07 – 2017-07-10 (×4): 6 via TOPICAL

## 2017-07-06 MED ORDER — MUPIROCIN 2 % EX OINT
1.0000 "application " | TOPICAL_OINTMENT | Freq: Two times a day (BID) | CUTANEOUS | Status: AC
  Administered 2017-07-06 – 2017-07-10 (×10): 1 via NASAL
  Filled 2017-07-06 (×2): qty 22

## 2017-07-06 MED ORDER — ACETAMINOPHEN 650 MG RE SUPP: 650.0000 mg | RECTAL | Status: DC | PRN

## 2017-07-06 MED ORDER — PERFLUTREN LIPID MICROSPHERE
1.0000 mL | INTRAVENOUS | Status: AC | PRN
  Administered 2017-07-06: 4 mL via INTRAVENOUS
  Filled 2017-07-06: qty 10

## 2017-07-06 MED ORDER — CLONIDINE HCL 0.1 MG PO TABS
0.1000 mg | ORAL_TABLET | Freq: Two times a day (BID) | ORAL | Status: DC
  Administered 2017-07-07: 0.1 mg via ORAL
  Filled 2017-07-06: qty 1

## 2017-07-06 MED ORDER — ATORVASTATIN CALCIUM 80 MG PO TABS
80.0000 mg | ORAL_TABLET | Freq: Every day | ORAL | Status: DC
  Administered 2017-07-06 – 2017-07-17 (×12): 80 mg via ORAL
  Filled 2017-07-06 (×12): qty 1

## 2017-07-06 MED ORDER — ATORVASTATIN CALCIUM 40 MG PO TABS: 40.0000 mg | ORAL_TABLET | Freq: Every day | ORAL | Status: DC

## 2017-07-06 MED ORDER — ONDANSETRON HCL 4 MG/2ML IJ SOLN: 4.0000 mg | Freq: Once | INTRAMUSCULAR | Status: DC | PRN

## 2017-07-06 MED ORDER — CLOPIDOGREL BISULFATE 75 MG PO TABS
75.0000 mg | ORAL_TABLET | Freq: Every day | ORAL | Status: DC
  Administered 2017-07-07 – 2017-07-10 (×4): 75 mg via ORAL
  Filled 2017-07-06 (×4): qty 1

## 2017-07-06 MED ORDER — CHLORHEXIDINE GLUCONATE CLOTH 2 % EX PADS
6.0000 | MEDICATED_PAD | Freq: Every day | CUTANEOUS | Status: DC
  Administered 2017-07-06: 6 via TOPICAL

## 2017-07-06 MED ORDER — GLYCOPYRROLATE 0.2 MG/ML IJ SOLN
INTRAMUSCULAR | Status: DC | PRN
  Administered 2017-07-06 (×2): 0.2 mg via INTRAVENOUS

## 2017-07-06 MED ORDER — FEBUXOSTAT 40 MG PO TABS
40.0000 mg | ORAL_TABLET | Freq: Every day | ORAL | Status: DC
  Administered 2017-07-06 – 2017-07-17 (×12): 40 mg via ORAL
  Filled 2017-07-06 (×12): qty 1

## 2017-07-06 MED ORDER — DOXERCALCIFEROL 4 MCG/2ML IV SOLN
2.0000 ug | INTRAVENOUS | Status: DC
  Administered 2017-07-07: 2 ug via INTRAVENOUS
  Filled 2017-07-06 (×3): qty 2

## 2017-07-06 MED ORDER — LABETALOL HCL 5 MG/ML IV SOLN
10.0000 mg | INTRAVENOUS | Status: DC | PRN
  Administered 2017-07-09 (×2): 10 mg via INTRAVENOUS
  Filled 2017-07-06 (×2): qty 4

## 2017-07-06 MED ORDER — ACETAMINOPHEN 325 MG PO TABS: 650.0000 mg | ORAL_TABLET | ORAL | Status: DC | PRN

## 2017-07-06 MED ORDER — CLONIDINE HCL 0.1 MG PO TABS
0.2000 mg | ORAL_TABLET | Freq: Two times a day (BID) | ORAL | Status: DC
  Filled 2017-07-06: qty 2

## 2017-07-06 MED ORDER — MEMANTINE HCL 10 MG PO TABS
10.0000 mg | ORAL_TABLET | Freq: Two times a day (BID) | ORAL | Status: DC
  Administered 2017-07-06 – 2017-07-17 (×23): 10 mg via ORAL
  Filled 2017-07-06 (×26): qty 1

## 2017-07-06 MED ORDER — ASPIRIN EC 325 MG PO TBEC
325.0000 mg | DELAYED_RELEASE_TABLET | Freq: Every day | ORAL | Status: DC
  Administered 2017-07-06 – 2017-07-10 (×5): 325 mg via ORAL
  Filled 2017-07-06 (×5): qty 1

## 2017-07-06 MED ORDER — ACETAMINOPHEN 160 MG/5ML PO SOLN: 650.0000 mg | ORAL | Status: DC | PRN

## 2017-07-06 MED ORDER — SODIUM CHLORIDE 0.9 % IV BOLUS
1000.0000 mL | Freq: Once | INTRAVENOUS | Status: AC
  Administered 2017-07-06: 1000 mL via INTRAVENOUS

## 2017-07-06 MED ORDER — FENTANYL CITRATE (PF) 100 MCG/2ML IJ SOLN
25.0000 ug | INTRAMUSCULAR | Status: DC | PRN
  Administered 2017-07-06: 25 ug via INTRAVENOUS
  Filled 2017-07-06 (×2): qty 2

## 2017-07-06 MED ORDER — NICARDIPINE HCL IN NACL 20-0.86 MG/200ML-% IV SOLN: 0.0000 mg/h | INTRAVENOUS | Status: DC

## 2017-07-06 MED ORDER — METOPROLOL TARTRATE 25 MG PO TABS
25.0000 mg | ORAL_TABLET | Freq: Two times a day (BID) | ORAL | Status: DC
  Administered 2017-07-07: 25 mg via ORAL
  Filled 2017-07-06: qty 1

## 2017-07-06 MED ORDER — IOPAMIDOL (ISOVUE-300) INJECTION 61%
INTRAVENOUS | Status: AC
  Filled 2017-07-06: qty 50

## 2017-07-06 MED ORDER — METOPROLOL TARTRATE 50 MG PO TABS
50.0000 mg | ORAL_TABLET | Freq: Two times a day (BID) | ORAL | Status: DC
  Administered 2017-07-06: 50 mg via ORAL
  Filled 2017-07-06: qty 1

## 2017-07-06 MED ORDER — ENOXAPARIN SODIUM 30 MG/0.3ML ~~LOC~~ SOLN
30.0000 mg | SUBCUTANEOUS | Status: DC
  Administered 2017-07-06 – 2017-07-08 (×3): 30 mg via SUBCUTANEOUS
  Filled 2017-07-06 (×3): qty 0.3

## 2017-07-06 MED ORDER — ORAL CARE MOUTH RINSE
15.0000 mL | Freq: Two times a day (BID) | OROMUCOSAL | Status: DC
  Administered 2017-07-06 – 2017-07-17 (×23): 15 mL via OROMUCOSAL

## 2017-07-06 MED ORDER — NEOSTIGMINE METHYLSULFATE 10 MG/10ML IV SOLN
INTRAVENOUS | Status: DC | PRN
  Administered 2017-07-06 (×3): 1 mg via INTRAVENOUS

## 2017-07-06 MED ORDER — IOPAMIDOL (ISOVUE-300) INJECTION 61%
INTRAVENOUS | Status: AC
  Administered 2017-07-06: 15 mL
  Filled 2017-07-06: qty 50

## 2017-07-06 NOTE — Procedures (Signed)
S/P 4 vessel cerebral arteriogram,followed by IA infusion of 3.6mg  of superselective integrelin  In to  occluded inferior division  of   Lt MCA and mechanical thrombolysis  wth microguidewire. S.Pahola Dimmitt MD

## 2017-07-06 NOTE — Evaluation (Signed)
Physical Therapy Evaluation Patient Details Name: [name] MRN: [id number] DOB: Jul 24, 1960 Today's Date: 07/06/2017   History of Present Illness  57 y.o. male with a history of ESRD, previous stroke. Presenting with new right weakness, aphasia, and neglect. MRI showing acute left MCA territory cortical infarct   Clinical Impression  Orders received for PT evaluation. Patient demonstrates deficits in functional mobility as indicated below. Will benefit from continued skilled PT to address deficits and maximize function. Will see as indicated and progress as tolerated.  Patient currently requiring increased +2 physical assist for all aspects of mobility, feel patient would benefit from post acute rehabilitation to maximize functional recovery.    Follow Up Recommendations CIR, Supervision/Assistance - 24 hour     Equipment Recommendations  (TBD)    Recommendations for Other Services Rehab consult     Precautions / Restrictions Precautions Precautions: Fall      Mobility  Bed Mobility Overal bed mobility: Needs Assistance Bed Mobility: Supine to Sit     Supine to sit: +2 for physical assistance;Mod assist     General bed mobility comments: Mod A +2 for bringing BLEs towards EOB and then elevating trunk. Pt with decreased initiation and requiring Max tactile cues  Transfers Overall transfer level: Needs assistance Equipment used: 2 person hand held assist Transfers: Sit to/from [employer] Sit to Stand: Min assist;+2 physical assistance Stand pivot transfers: Mod assist;+2 physical assistance       General transfer comment: Min A +2 for steadying and balance during sit<>stand. Pt requiring Mod A +2 for stand pivot to recliner with tactile cues to initiate movement of LLE and physical A to prevent left lean.  Ambulation/Gait                Stairs            Wheelchair Mobility    Modified Rankin (Stroke Patients Only) Modified  Rankin (Stroke Patients Only) Pre-Morbid Rankin Score: No symptoms Modified Rankin: Severe disability     Balance Overall balance assessment: Needs assistance Sitting-balance support: No upper extremity supported;Feet supported Sitting balance-Leahy Scale: Fair     Standing balance support: No upper extremity supported;During functional activity Standing balance-Leahy Scale: Poor Standing balance comment: Requring physical A for right lateral lean                             Pertinent Vitals/Pain Faces Pain Scale: Hurts little more Pain Location: Moving RUE    Home Living Family/patient expects to be discharged to:: Private residence Living Arrangements: Alone               Additional Comments: Pt poor historian. Pt confirms he lives alone. Unsure of accuracy.    Prior Function Level of Independence: Independent         Comments: Poor historian     Hand Dominance   Dominant Hand: Right(From chart review)    Extremity/Trunk Assessment   Upper Extremity Assessment RUE Deficits / Details: Pt spontaneously moving RUE with decreased cooridnation and ROM. Pt not moving RUE with cues and demonstrating poor motor control and coorindation. Neglect of right UE RUE Coordination: decreased fine motor;decreased gross motor LUE Deficits / Details: Decreased coordination and strength. Able to bring hand to mouth during self feeding task and drinking.  LUE Coordination: decreased fine motor;decreased gross motor    Lower Extremity Assessment Lower Extremity Assessment: LLE deficits/detail LLE Coordination: decreased fine motor;decreased gross motor  Cervical / Trunk Assessment Cervical / Trunk Assessment: Other exceptions Cervical / Trunk Exceptions: Lateral lean to right  Communication   Communication: No difficulties  Cognition Arousal/Alertness: Awake/alert Behavior During Therapy: Flat affect Overall Cognitive Status: Impaired/Different from  baseline Area of Impairment: Attention;Memory;Following commands;Safety/judgement;Awareness;Problem solving                   Current Attention Level: Focused Memory: Decreased short-term memory;Decreased recall of precautions Following Commands: Follows one step commands inconsistently Safety/Judgement: Decreased awareness of safety;Decreased awareness of deficits Awareness: Intellectual Problem Solving: Slow processing;Decreased initiation;Difficulty sequencing;Requires verbal cues;Requires tactile cues General Comments: Difficulty following simple, direct commands. Pt able to perform AAROM. However, not initiating movement with verbal cues.      General Comments      Exercises     Assessment/Plan    PT Assessment Patient needs continued PT services  PT Problem List Decreased strength;Decreased activity tolerance;Decreased balance;Decreased mobility;Decreased coordination;Decreased cognition;Decreased safety awareness       PT Treatment Interventions DME instruction;Gait training;Functional mobility training;Therapeutic activities;Therapeutic exercise;Balance training;Neuromuscular re-education;Cognitive remediation;Patient/family education    PT Goals (Current goals can be found in the Care Plan section)  Acute Rehab PT Goals Patient Stated Goal: Unstated' PT Goal Formulation: Patient unable to participate in goal setting Time For Goal Achievement: 07/20/17 Potential to Achieve Goals: Fair    Frequency Min 4X/week   Barriers to discharge        Co-evaluation PT/OT/SLP Co-Evaluation/Treatment: Yes Reason for Co-Treatment: Complexity of the patient's impairments (multi-system involvement) PT goals addressed during session: Mobility/safety with mobility         AM-PAC PT "6 Clicks" Daily Activity  Outcome Measure Difficulty turning over in bed (including adjusting bedclothes, sheets and blankets)?: Unable Difficulty moving from lying on back to sitting on  the side of the bed? : Unable Difficulty sitting down on and standing up from a chair with arms (e.g., wheelchair, bedside commode, etc,.)?: Unable Help needed moving to and from a bed to chair (including a wheelchair)?: A Little Help needed walking in hospital room?: A Lot Help needed climbing 3-5 steps with a railing? : A Lot 6 Click Score: 10    End of Session   Activity Tolerance: Patient tolerated treatment well Patient left: in chair;with call bell/phone within reach;with chair alarm set Nurse Communication: Mobility status PT Visit Diagnosis: Unsteadiness on feet (R26.81);Difficulty in walking, not elsewhere classified (R26.2);Other symptoms and signs involving the nervous system (R29.898)    Time: 1610-9604 PT Time Calculation (min) (ACUTE ONLY): 31 min   Charges:         PT G Codes:        Charlotte Crumb, PT DPT  Board Certified Neurologic Specialist [phone]   Fabio Asa 07/06/2017, 5:34 PM

## 2017-07-06 NOTE — Progress Notes (Signed)
Patient ID: [name], male   DOB: [birth year], 58 y.o.   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 New onset of Rt sided weakness  And confusion. Premorbid mRSS  2. CT brain No ICH ASPECTS 7. CTA Occluded Inferior division. CTP CBF < 30 % vol 28 ml  Tmax > 6.0s vol 221 ML mismatch 193 ml. Mismatch 7.9   Option of endovascular treatment of Lt MCA inf division discussed with sister. Procedure,reasons ,risks alternatives discussed fully. Risks of ICH of 10 %,worsening neurological deficit ,vent dependency, death,inability to revascularize and  Vessel injury were reviewed .Questions were answered to her satisfaction. Informed witnessed consent was obtained.Marland Kitchen S.Adrielle Polakowski MD

## 2017-07-06 NOTE — Transfer of Care (Signed)
Immediate Anesthesia Transfer of Care Note  Patient: [name]  Procedure(s) Performed: RADIOLOGY WITH ANESTHESIA (N/A )  Patient Location: ICU  Anesthesia Type:General  Level of Consciousness: awake, alert  and patient cooperative  Airway & Oxygen Therapy: Patient Spontanous Breathing and Patient connected to face mask oxygen  Post-op Assessment: Report given to RN, Post -op Vital signs reviewed and stable and Patient moving all extremities X 4.  Right side sluggish but moves to commands.  Patient able to say name and answer "yes" to questions.  Post vital signs: Reviewed and stable  Last Vitals:  Vitals Value Taken Time  BP    Temp 36.1 C 07/06/2017  1:21 AM  Pulse 87 07/06/2017  1:21 AM  Resp    SpO2      Last Pain: There were no vitals filed for this visit.       Complications: No apparent anesthesia complications

## 2017-07-06 NOTE — Progress Notes (Signed)
Assisted admission to [id number]. [phone]

## 2017-07-06 NOTE — Consult Note (Signed)
[facility] Renal Consultation Note    Indication for Consultation:  Management of ESRD/hemodialysis; anemia, hypertension/volume and secondary hyperparathyroidism  WUJ:WJXB, Hunt Oris, MD  HPI: [name] is a 57 y.o. male. 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  Past medical history significant for HTN, CAD s/p CABG 03/2011, Hx multiple CVAs, and OSA.  Patient has been admitted for evaluation and management of L MCA infarct.  Unable to obtain history due to patient current mental status.  Answers "yes" to every question.  Patient was transported to [facility] ED Friday evening when he did not come to the door and was found with new R sided weakness, confusion and aphasia.  He was taken to IR for intervention of a new stroke.   Patient last dialysis was on 4/19, where his dry weight was met.  Notes from his outpatient center document more confusion recently, stating on Friday he arrived at the wrong time and thought he had already received dialysis.    Past Medical History:  Diagnosis Date  . ACHALASIA   . ANEMIA-NOS   . CAD (coronary artery disease), 3 vessel significant disease.    . Cardiomyopathy ([place]) 05/12/2017  . CEREBROVASCULAR ACCIDENT, HX OF   . CHF (congestive heart failure) ([place])   . CKD (chronic kidney disease) stage 4, GFR 15-29 ml/min (HCC)   . Constipation   . DEGENERATIVE JOINT DISEASE, SHOULDER    knee  . DEPRESSION    2006- not depressed any longer  . Family history of adverse reaction to anesthesia    Mother, hard to awaken  . GERD    no meds required  . Gout    takes Uloric daily  . H/O hiatal hernia   . Headache(784.0)   . Heart attack ([place])   . History of colon polyps   . History of diastolic dysfunction, grade 2 by echo   . HYPERLIPIDEMIA    takes crestor daily  . Hyperlipidemia   . HYPERTENSION    takes Norvasc,Catapress,Hydralazine,Imdur,and Metoprolol daily  . Impaired glucose tolerance   . Insomnia    takes  Ambien nightly  . NSTEMI (non-ST elevated myocardial infarction), 06/30/11 07/01/2011  . RENAL CALCULUS, HX OF   . RENAL INSUFFICIENCY    12/2013- TThSat  . S/P coronary artery stent placement, PTCA/DES Resolute to mid-LAD via the LIMA graft, and PTCA of apical 95% stenosis 07/05/11 07/04/2011  . S/P PTCA (percutaneous transluminal coronary angioplasty), 06/30/11 07/01/2011  . Seizures (HCC)    INSULIN INDUCED  . Sleep apnea    SLEEP STUDY IN [place] , NO MACHINE YET  1 YEAR  . Stroke ([place])    93/2005/2006/2007;left sided weakness.  1993 12/2013  . Thyroid disease    Past Surgical History:  Procedure Laterality Date  . ARCH AORTOGRAM N/A 02/19/2012   Procedure: ARCH AORTOGRAM;  Surgeon: Conrad Buena Vista, MD;  Location: [facility] CATH LAB;  Service: Cardiovascular;  Laterality: N/A;  . AV FISTULA PLACEMENT  12/02/2011   Procedure: ARTERIOVENOUS (AV) FISTULA CREATION;  Surgeon: Conrad Amagon, MD;  Location: [place];  Service: Vascular;  Laterality: Left;  Ultrasound Guided  . AV FISTULA PLACEMENT Left 06/08/2012   Procedure: ARTERIOVENOUS (AV) FISTULA CREATION;  Surgeon: Conrad El Rancho, MD;  Location: [place];  Service: Vascular;  Laterality: Left;  . BASCILIC VEIN TRANSPOSITION Left 09/06/2014   Procedure: FIRST STAGE BASILIC VEIN TRANSPOSITION ;  Surgeon: Conrad Newhalen, MD;  Location: [place];  Service:  Vascular;  Laterality: Left;  . BASCILIC VEIN TRANSPOSITION Left 12/06/2014   Procedure: SECOND STAGE BRACHIAL VEIN TRANSPOSITION ;  Surgeon: Conrad Forbestown, MD;  Location: [place];  Service: Vascular;  Laterality: Left;  . CARDIAC CATHETERIZATION    . COLONOSCOPY    . CORONARY ANGIOPLASTY     06/30/11   AT [facility]  . CORONARY ARTERY BYPASS GRAFT  04/01/2011   Procedure: CORONARY ARTERY BYPASS GRAFTING (CABG);  Surgeon: Tharon Aquas Adelene Idler, MD;  Location: [place];  Service: Open Heart Surgery;  Laterality: N/A;  . HERNIA REPAIR  2013  . INSERTION OF DIALYSIS CATHETER  04/01/2011   Procedure: INSERTION OF DIALYSIS CATHETER;   Surgeon: Hinda Lenis, MD;  Location: [place];  Service: Vascular;  Laterality: N/A;  . KNEE SURGERY Right 01/07/2011   arthroscopy  . LEFT HEART CATH AND CORS/GRAFTS ANGIOGRAPHY N/A 03/05/2017   Procedure: LEFT HEART CATH AND CORS/GRAFTS ANGIOGRAPHY;  Surgeon: Lorretta Harp, MD;  Location: [place] CV LAB;  Service: Cardiovascular;  Laterality: N/A;  . LEFT HEART CATHETERIZATION WITH CORONARY ANGIOGRAM N/A 03/28/2011   Procedure: LEFT HEART CATHETERIZATION WITH CORONARY ANGIOGRAM;  Surgeon: Troy Sine, MD;  Location: [facility] CATH LAB;  Service: Cardiovascular;  Laterality: N/A;  pending creatnine  . LEFT HEART CATHETERIZATION WITH CORONARY/GRAFT ANGIOGRAM N/A 06/30/2011   Procedure: LEFT HEART CATHETERIZATION WITH Beatrix Fetters;  Surgeon: Sanda Klein, MD;  Location: [place] CATH LAB;  Service: Cardiovascular;  Laterality: N/A;  . LIGATION OF ARTERIOVENOUS  FISTULA Left 06/08/2012   Procedure: LIGATION OF ARTERIOVENOUS  FISTULA;  Surgeon: Conrad [place], MD;  Location: [place];  Service: Vascular;  Laterality: Left;  . NM MYOCAR PERF EJECTION FRACTION  06/25/2011   There is evidence of mild ischemia in the basal inferolateral and mid inferolateral regions. (Extent 7 %) The post-stress ejection fraction is 44%. There is mild hypocontractility in the distal inferoapical segment. baseline T wave inversion is noted in leads I, avL, II, V2. This is a low risk scan. The present perfusion study suggests significant myocardial salvage with mild residual ischemia .  Marland Kitchen PERCUTANEOUS CORONARY INTERVENTION-BALLOON ONLY  06/30/2011   Procedure: PERCUTANEOUS CORONARY INTERVENTION-BALLOON ONLY;  Surgeon: Sanda Klein, MD;  Location: [place] CATH LAB;  Service: Cardiovascular;;  . PERCUTANEOUS CORONARY STENT INTERVENTION (PCI-S) N/A 07/04/2011   Procedure: PERCUTANEOUS CORONARY STENT INTERVENTION (PCI-S);  Surgeon: Troy Sine, MD;  Location: [facility] CATH LAB;  Service: Cardiovascular;  Laterality: N/A;  .  REVISION OF ARTERIOVENOUS GORETEX GRAFT Left 12/06/2014   Procedure: REVISION OF ARTERIOVENOUS GORETEX GRAFT USING 36mx10cm;  Surgeon: BConrad Myrtle MD;  [place]  Service: Vascular;  Laterality: Left;  . STOMACH SURGERY  2009   achalasia  . SUBCLAVIAN STENT PLACEMENT  12/20/2011  . TONSILLECTOMY    . TONSILLECTOMY     Family History  Problem Relation Age of Onset  . Cancer Mother   . Heart disease Mother   . Hyperlipidemia Mother   . Hypertension Mother   . Alzheimer's disease Mother   . Cancer Father   . Heart disease Father   . Hypertension Other   . Stroke Other   . Hyperlipidemia Other    Social History:  reports that he has never smoked. He quit smokeless tobacco use about 14 years ago. His smokeless tobacco use included chew. He reports that he drinks about 1.2 oz of alcohol per week. He reports that he does not use drugs. Allergies  Allergen Reactions  . Shrimp [  Shellfish Allergy] Shortness Of Breath  . Atorvastatin Other (See Comments)    weakness  . Insulins Other (See Comments)    Patient unsure as to the kind of insulin but states that it has previously caused seizures.  Most recent hospitalization (Jan 2013) received insulin but did not have reaction. LIKELY DUE TO SIGNIFICANT HYPOGLYCEMIA  . Simvastatin Other (See Comments)     abnormal liver tests  . Ultram [Tramadol] Other (See Comments)    Liver enzyme    Prior to Admission medications   Medication Sig Start Date End Date Taking? Authorizing Provider  amLODipine (NORVASC) 5 MG tablet TAKE ONE TABLET BY MOUTH ONE TIME DAILY  12/08/16   Lorretta Harp, MD  aspirin EC 81 MG EC tablet Take 1 tablet (81 mg total) by mouth daily. 07/07/11   Brett Canales, PA-C  atorvastatin (LIPITOR) 40 MG tablet Take 1 tablet (40 mg total) by mouth daily. 05/12/17 08/10/17  Biagio Borg, MD  cinacalcet (SENSIPAR) 90 MG tablet Take 90 mg by mouth daily with supper. 06/26/17   [provider]  cloNIDine (CATAPRES)  0.2 MG tablet Take 1 tablet (0.2 mg total) by mouth 2 (two) times daily. 03/19/17   Lendon Colonel, NP  clopidogrel (PLAVIX) 75 MG tablet Take 1 tablet (75 mg total) by mouth daily. 03/19/17   Lendon Colonel, NP  doxercalciferol (HECTOROL) 4 MCG/2ML injection Inject 2.5 mLs (5 mcg total) into the vein every Monday, Wednesday, and Friday with hemodialysis. 03/06/17   Cherene Altes, MD  febuxostat (ULORIC) 40 MG tablet Take 40 mg by mouth daily as needed (gout).  05/13/11   Biagio Borg, MD  ferric gluconate 125 mg in sodium chloride 0.9 % 100 mL Inject 125 mg into the vein every Monday, Wednesday, and Friday with hemodialysis. 03/06/17   Cherene Altes, MD  ketoconazole (NIZORAL) 2 % cream Apply 1 application topically daily. 12/18/16   Trula Slade, DPM  lanthanum ([name]) 1000 MG chewable tablet Chew 2 tablets (2,000 mg total) by mouth 3 (three) times daily with meals. Yearly physical due in July must see MD for refills 09/10/15   Biagio Borg, MD  lidocaine-prilocaine (EMLA) cream Apply 1 application topically as needed. Apply as needed for dialysis site    [provider]  memantine (NAMENDA) 10 MG tablet Take 1 tablet (10 mg total) by mouth 2 (two) times daily. 05/12/17   Biagio Borg, MD  metoprolol tartrate (LOPRESSOR) 50 MG tablet Take 1 tablet (50 mg total) by mouth 2 (two) times daily. 05/12/17   Biagio Borg, MD  nitroGLYCERIN (NITROSTAT) 0.4 MG SL tablet PLACE 1 TABLET UNDER TONGUE AS NEEDED FOR CHEST PAIN EVERY 5 MINUTES (MAX 3 DOSES) 05/12/17   Biagio Borg, MD  terazosin (HYTRIN) 10 MG capsule TAKE 1 CAPSULE BY MOUTH DAILY AT BEDTIME 05/12/17   Biagio Borg, MD  zolpidem (AMBIEN) 10 MG tablet Take 1 tablet (10 mg total) by mouth at bedtime as needed. for sleep 05/12/17   Biagio Borg, MD   Current Facility-Administered Medications  Medication Dose Route Frequency Provider Last Rate Last Dose  .  stroke: mapping our early stages of recovery book   Does not  apply Once Greta Doom, MD      . 0.9 %  sodium chloride infusion   Intravenous Continuous Rosalin Hawking, MD 30 mL/hr at 07/06/17 1008    . acetaminophen (TYLENOL) tablet 650 mg  650 mg  Oral Q4H PRN Greta Doom, MD       Or  . acetaminophen (TYLENOL) solution 650 mg  650 mg Per Tube Q4H PRN Greta Doom, MD       Or  . acetaminophen (TYLENOL) suppository 650 mg  650 mg Rectal Q4H PRN Greta Doom, MD      . atorvastatin (LIPITOR) tablet 80 mg  80 mg Oral Daily Rosalin Hawking, MD   80 mg at 07/06/17 1010  . Chlorhexidine Gluconate Cloth 2 % PADS 6 each  6 each Topical Q0600 Rosalin Hawking, MD      . cloNIDine (CATAPRES) tablet 0.2 mg  0.2 mg Oral BID Rosalin Hawking, MD      . Derrill Memo ON 07/07/2017] clopidogrel (PLAVIX) tablet 75 mg  75 mg Oral Daily Greta Doom, MD      . febuxostat (ULORIC) tablet 40 mg  40 mg Oral Daily Greta Doom, MD   40 mg at 07/06/17 1009  . fentaNYL (SUBLIMAZE) injection 25-50 mcg  25-50 mcg Intravenous Q5 min PRN Roberts Gaudy, MD      . labetalol (NORMODYNE,TRANDATE) injection 10-20 mg  10-20 mg Intravenous Q10 min PRN Rosalin Hawking, MD      . MEDLINE mouth rinse  15 mL Mouth Rinse BID Greta Doom, MD   15 mL at 07/06/17 1011  . memantine (NAMENDA) tablet 10 mg  10 mg Oral BID Greta Doom, MD   10 mg at 07/06/17 1010  . metoprolol tartrate (LOPRESSOR) tablet 50 mg  50 mg Oral BID Rosalin Hawking, MD   50 mg at 07/06/17 1009  . mupirocin ointment (BACTROBAN) 2 % 1 application  1 application Nasal BID Rosalin Hawking, MD   1 application at 41/96/22 1010  . nicardipine (CARDENE) '20mg'$  in 0.86% saline 245m IV infusion (0.1 mg/ml)  0-15 mg/hr Intravenous Continuous Deveshwar, Sanjeev, MD      . ondansetron (ZOFRAN) injection 4 mg  4 mg Intravenous Once PRN JRoberts Gaudy MD       Labs: Basic Metabolic Panel: Recent Labs  Lab 07/05/17 2215 07/05/17 2232 07/06/17 0510  NA 136 133* 133*  K 4.0 4.8 3.5  CL  94* 98* 101  CO2 20*  --  18*  GLUCOSE 135* 130* 106*  BUN 39* 48* 35*  CREATININE 14.46* 14.80* 13.49*  CALCIUM 10.7*  --  9.6   Liver Function Tests: Recent Labs  Lab 07/05/17 2215  AST 15  ALT 9*  ALKPHOS 40  BILITOT 1.0  PROT 7.4  ALBUMIN 3.9   CBC: Recent Labs  Lab 07/05/17 2215 07/05/17 2232 07/06/17 0510  WBC 14.8*  --  12.0*  NEUTROABS 12.7*  --  9.0*  HGB 13.2 12.6* 11.4*  HCT 38.9* 37.0* 32.9*  MCV 92.4  --  91.1  PLT 191  --  197   CBG: Recent Labs  Lab 07/05/17 2228 07/06/17 0158  GLUCAP 125* 97   Studies/Results: Ct Angio Head W Or Wo Contrast  Result Date: 07/05/2017 CLINICAL DATA:  Right hemiparesis and difficulty speaking. EXAM: CT ANGIOGRAPHY HEAD AND NECK CT PERFUSION BRAIN TECHNIQUE: Multidetector CT imaging of the head and neck was performed using the standard protocol during bolus administration of intravenous contrast. Multiplanar CT image reconstructions and MIPs were obtained to evaluate the vascular anatomy. Carotid stenosis measurements (when applicable) are obtained utilizing NASCET criteria, using the distal internal carotid diameter as the denominator. Multiphase CT imaging of the brain was performed following IV bolus contrast  injection. Subsequent parametric perfusion maps were calculated using RAPID software. CONTRAST:  17m ISOVUE-370 IOPAMIDOL (ISOVUE-370) INJECTION 76% COMPARISON:  Head MRA 12/29/2013.  Neck MRA 06/30/2005 FINDINGS: CTA NECK FINDINGS Aortic arch: Standard 3 vessel aortic arch. Widely patent arch vessel origins. Right carotid system: Patent with predominantly soft plaque in the common carotid artery resulting in less than 50% narrowing. Minimal calcified plaque at the carotid bifurcation. No ICA stenosis. Left carotid system: Patent with predominantly soft plaque in the distal common carotid artery resulting in 50% stenosis. Moderate calcified plaque about the carotid bifurcation. No ICA stenosis. Vertebral arteries: Patent  with the left being slightly dominant. Mild proximal right V2 stenosis due to calcified plaque. Calcified and soft plaque in the left V1 segment with mild multifocal stenoses. Skeleton: Mild cervical disc degeneration. Other neck: Multiple thyroid nodules including a 2.9 cm exophytic. Nodule extending inferiorly from the isthmus. Upper chest: Clear lung apices. Review of the MIP images confirms the above findings CTA HEAD FINDINGS Anterior circulation: The internal carotid arteries are patent from skull base to carotid termini with calcified plaque resulting in mild cavernous and supraclinoid stenosis on the right. The M1 segments are patent with mild stenosis on the right. The left M2 inferior division is occluded at its origin with some reconstitution of more distal M2 and M3 branches, however there are numerous missing distal branches in the left parietal region corresponding to the acute infarct. The prior MRA also suggested a severe stenosis or possibly short segment occlusion of the left M2 inferior division, however motion artifact on that study makes it difficult to make a detailed assessment of interval changes. There are also likely some missing distal left MCA superior division branch vessels. There also severe proximal right M2 stenoses which are at least partially chronic. The right A1 segment is occluded. The left A1 segment is patent but demonstrates a severe proximal stenosis. Both A2 segments are heavily diseased with multifocal severe stenoses and or short segment occlusions. The appearance of the ACAs is grossly similar to the prior motion degraded MRA. No aneurysm. Posterior circulation: The intracranial vertebral arteries are patent to the basilar. There is right greater than left V4 segment calcified plaque without significant stenosis. Patent SCA origins are identified bilaterally. PICA and AICA origins are not well evaluated. The basilar artery is widely patent. Posterior communicating  arteries are diminutive or absent. PCAs are patent with mild left P2 stenosis. No aneurysm. Venous sinuses: Inadequately assessed due to arterial phase contrast timing. Anatomic variants: None. Delayed phase: Not performed. Review of the MIP images confirms the above findings CT Brain Perfusion Findings: CBF (<30%) Volume: 233mPerfusion (Tmax>6.0s) volume: 22133mhowever a substantial amount of this is located in the right cerebral hemisphere Mismatch Volume: 193m38mough with the caveat that this is artifactually inflated due to (likely chronic) prolonged perfusion in the right cerebral hemisphere. There is still however an apparent sizable penumbra in the posterior left cerebral hemisphere. Infarction Location:Predominantly left parietal lobe with a small amount of posterior left frontal lobe, posterior left temporal lobe, and posterior insula involvement. IMPRESSION: 1. 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 2. Left M2 inferior division occlusion at its origin with some distal reconstitution. Severe stenosis (or possibly short segment occlusion) in this location on a prior MRA. Numerous missing distal branch vessels in the left parietal region corresponding to the acute infarct. 3. Advanced intracranial atherosclerosis with numerous severe stenoses as above. 4. 50% left common carotid artery stenosis. 5.  Aortic  Atherosclerosis (ICD10-I70.0). 6. Thyroid nodules measuring up to 2.9 cm. Nonemergent thyroid ultrasound recommended for further evaluation. These results were communicated to Dr. Leonel Ramsay at 10:18 pm on 07/05/2017 by text page via the [facility] messaging system. Electronically Signed   By: Logan Bores M.D.   On: 07/05/2017 22:47   Ct Head Wo Contrast  Result Date: 07/06/2017 CLINICAL DATA:  Left MCA infarct status post intervention EXAM: CT HEAD WITHOUT CONTRAST TECHNIQUE: Contiguous axial images were obtained from the base of the skull through the vertex without intravenous  contrast. COMPARISON:  Cerebral angiogram 07/05/2017 CTA head neck 07/05/2017 FINDINGS: Brain: There is hyperdensity within the left basal ganglia, likely indicating contrast staining. No midline shift or mass effect. No hydrocephalus. Unchanged right temporal lobe encephalomalacia. Vascular: There is intravascular contrast material related to prior an angiographic study. Skull: Normal visualized skull base, calvarium and extracranial soft tissues. Sinuses/Orbits: No sinus fluid levels or advanced mucosal thickening. No mastoid effusion. Normal orbits. IMPRESSION: Hyperdensity of the left basal ganglia is favored to represent contrast staining over petechial hemorrhage. Electronically Signed   By: Ulyses Jarred M.D.   On: 07/06/2017 05:56   Ct Angio Neck W Or Wo Contrast  Result Date: 07/05/2017 CLINICAL DATA:  Right hemiparesis and difficulty speaking. EXAM: CT ANGIOGRAPHY HEAD AND NECK CT PERFUSION BRAIN TECHNIQUE: Multidetector CT imaging of the head and neck was performed using the standard protocol during bolus administration of intravenous contrast. Multiplanar CT image reconstructions and MIPs were obtained to evaluate the vascular anatomy. Carotid stenosis measurements (when applicable) are obtained utilizing NASCET criteria, using the distal internal carotid diameter as the denominator. Multiphase CT imaging of the brain was performed following IV bolus contrast injection. Subsequent parametric perfusion maps were calculated using RAPID software. CONTRAST:  180m ISOVUE-370 IOPAMIDOL (ISOVUE-370) INJECTION 76% COMPARISON:  Head MRA 12/29/2013.  Neck MRA 06/30/2005 FINDINGS: CTA NECK FINDINGS Aortic arch: Standard 3 vessel aortic arch. Widely patent arch vessel origins. Right carotid system: Patent with predominantly soft plaque in the common carotid artery resulting in less than 50% narrowing. Minimal calcified plaque at the carotid bifurcation. No ICA stenosis. Left carotid system: Patent with  predominantly soft plaque in the distal common carotid artery resulting in 50% stenosis. Moderate calcified plaque about the carotid bifurcation. No ICA stenosis. Vertebral arteries: Patent with the left being slightly dominant. Mild proximal right V2 stenosis due to calcified plaque. Calcified and soft plaque in the left V1 segment with mild multifocal stenoses. Skeleton: Mild cervical disc degeneration. Other neck: Multiple thyroid nodules including a 2.9 cm exophytic. Nodule extending inferiorly from the isthmus. Upper chest: Clear lung apices. Review of the MIP images confirms the above findings CTA HEAD FINDINGS Anterior circulation: The internal carotid arteries are patent from skull base to carotid termini with calcified plaque resulting in mild cavernous and supraclinoid stenosis on the right. The M1 segments are patent with mild stenosis on the right. The left M2 inferior division is occluded at its origin with some reconstitution of more distal M2 and M3 branches, however there are numerous missing distal branches in the left parietal region corresponding to the acute infarct. The prior MRA also suggested a severe stenosis or possibly short segment occlusion of the left M2 inferior division, however motion artifact on that study makes it difficult to make a detailed assessment of interval changes. There are also likely some missing distal left MCA superior division branch vessels. There also severe proximal right M2 stenoses which are at least partially chronic. The right  A1 segment is occluded. The left A1 segment is patent but demonstrates a severe proximal stenosis. Both A2 segments are heavily diseased with multifocal severe stenoses and or short segment occlusions. The appearance of the ACAs is grossly similar to the prior motion degraded MRA. No aneurysm. Posterior circulation: The intracranial vertebral arteries are patent to the basilar. There is right greater than left V4 segment calcified plaque  without significant stenosis. Patent SCA origins are identified bilaterally. PICA and AICA origins are not well evaluated. The basilar artery is widely patent. Posterior communicating arteries are diminutive or absent. PCAs are patent with mild left P2 stenosis. No aneurysm. Venous sinuses: Inadequately assessed due to arterial phase contrast timing. Anatomic variants: None. Delayed phase: Not performed. Review of the MIP images confirms the above findings CT Brain Perfusion Findings: CBF (<30%) Volume: 86m Perfusion (Tmax>6.0s) volume: 2223m however a substantial amount of this is located in the right cerebral hemisphere Mismatch Volume: 19337mhough with the caveat that this is artifactually inflated due to (likely chronic) prolonged perfusion in the right cerebral hemisphere. There is still however an apparent sizable penumbra in the posterior left cerebral hemisphere. Infarction Location:Predominantly left parietal lobe with a small amount of posterior left frontal lobe, posterior left temporal lobe, and posterior insula involvement. IMPRESSION: 1. 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 2. Left M2 inferior division occlusion at its origin with some distal reconstitution. Severe stenosis (or possibly short segment occlusion) in this location on a prior MRA. Numerous missing distal branch vessels in the left parietal region corresponding to the acute infarct. 3. Advanced intracranial atherosclerosis with numerous severe stenoses as above. 4. 50% left common carotid artery stenosis. 5.  Aortic Atherosclerosis (ICD10-I70.0). 6. Thyroid nodules measuring up to 2.9 cm. Nonemergent thyroid ultrasound recommended for further evaluation. These results were communicated to Dr. KirLeonel Ramsay 10:18 pm on 07/05/2017 by text page via the [facility] system. Electronically Signed   By: AllLogan BoresD.   On: 07/05/2017 22:47   Ct Cerebral Perfusion W Contrast  Result Date: 07/05/2017 CLINICAL  DATA:  Right hemiparesis and difficulty speaking. EXAM: CT ANGIOGRAPHY HEAD AND NECK CT PERFUSION BRAIN TECHNIQUE: Multidetector CT imaging of the head and neck was performed using the standard protocol during bolus administration of intravenous contrast. Multiplanar CT image reconstructions and MIPs were obtained to evaluate the vascular anatomy. Carotid stenosis measurements (when applicable) are obtained utilizing NASCET criteria, using the distal internal carotid diameter as the denominator. Multiphase CT imaging of the brain was performed following IV bolus contrast injection. Subsequent parametric perfusion maps were calculated using RAPID software. CONTRAST:  100m64mOVUE-370 IOPAMIDOL (ISOVUE-370) INJECTION 76% COMPARISON:  Head MRA 12/29/2013.  Neck MRA 06/30/2005 FINDINGS: CTA NECK FINDINGS Aortic arch: Standard 3 vessel aortic arch. Widely patent arch vessel origins. Right carotid system: Patent with predominantly soft plaque in the common carotid artery resulting in less than 50% narrowing. Minimal calcified plaque at the carotid bifurcation. No ICA stenosis. Left carotid system: Patent with predominantly soft plaque in the distal common carotid artery resulting in 50% stenosis. Moderate calcified plaque about the carotid bifurcation. No ICA stenosis. Vertebral arteries: Patent with the left being slightly dominant. Mild proximal right V2 stenosis due to calcified plaque. Calcified and soft plaque in the left V1 segment with mild multifocal stenoses. Skeleton: Mild cervical disc degeneration. Other neck: Multiple thyroid nodules including a 2.9 cm exophytic. Nodule extending inferiorly from the isthmus. Upper chest: Clear lung apices. Review of the MIP images confirms the  above findings CTA HEAD FINDINGS Anterior circulation: The internal carotid arteries are patent from skull base to carotid termini with calcified plaque resulting in mild cavernous and supraclinoid stenosis on the right. The M1 segments  are patent with mild stenosis on the right. The left M2 inferior division is occluded at its origin with some reconstitution of more distal M2 and M3 branches, however there are numerous missing distal branches in the left parietal region corresponding to the acute infarct. The prior MRA also suggested a severe stenosis or possibly short segment occlusion of the left M2 inferior division, however motion artifact on that study makes it difficult to make a detailed assessment of interval changes. There are also likely some missing distal left MCA superior division branch vessels. There also severe proximal right M2 stenoses which are at least partially chronic. The right A1 segment is occluded. The left A1 segment is patent but demonstrates a severe proximal stenosis. Both A2 segments are heavily diseased with multifocal severe stenoses and or short segment occlusions. The appearance of the ACAs is grossly similar to the prior motion degraded MRA. No aneurysm. Posterior circulation: The intracranial vertebral arteries are patent to the basilar. There is right greater than left V4 segment calcified plaque without significant stenosis. Patent SCA origins are identified bilaterally. PICA and AICA origins are not well evaluated. The basilar artery is widely patent. Posterior communicating arteries are diminutive or absent. PCAs are patent with mild left P2 stenosis. No aneurysm. Venous sinuses: Inadequately assessed due to arterial phase contrast timing. Anatomic variants: None. Delayed phase: Not performed. Review of the MIP images confirms the above findings CT Brain Perfusion Findings: CBF (<30%) Volume: 96m Perfusion (Tmax>6.0s) volume: 2220m however a substantial amount of this is located in the right cerebral hemisphere Mismatch Volume: 19369mhough with the caveat that this is artifactually inflated due to (likely chronic) prolonged perfusion in the right cerebral hemisphere. There is still however an apparent  sizable penumbra in the posterior left cerebral hemisphere. Infarction Location:Predominantly left parietal lobe with a small amount of posterior left frontal lobe, posterior left temporal lobe, and posterior insula involvement. IMPRESSION: 1. 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 2. Left M2 inferior division occlusion at its origin with some distal reconstitution. Severe stenosis (or possibly short segment occlusion) in this location on a prior MRA. Numerous missing distal branch vessels in the left parietal region corresponding to the acute infarct. 3. Advanced intracranial atherosclerosis with numerous severe stenoses as above. 4. 50% left common carotid artery stenosis. 5.  Aortic Atherosclerosis (ICD10-I70.0). 6. Thyroid nodules measuring up to 2.9 cm. Nonemergent thyroid ultrasound recommended for further evaluation. These results were communicated to Dr. KirLeonel Ramsay 10:18 pm on 07/05/2017 by text page via the [facility] system. Electronically Signed   By: AllLogan BoresD.   On: 07/05/2017 22:47   Dg Chest Port 1 View  Result Date: 07/06/2017 CLINICAL DATA:  Cardiomyopathy, coronary artery disease with stent placement, chronic renal insufficiency. EXAM: PORTABLE CHEST 1 VIEW COMPARISON:  Chest x-ray of March 04, 2017 FINDINGS: The lungs are borderline hypoinflated but clear. The heart is top-normal in size. The pulmonary vascularity is normal. There is no pleural effusion. There are post CABG changes. There is a vascular graft in the left axillary region. The bony thorax exhibits no acute abnormality. IMPRESSION: Top-normal cardiac size without pulmonary edema or pleural effusion. No pneumonia. Electronically Signed   By: David  JorMartiniqueD.   On: 07/06/2017 07:57   Ct Head  Code Stroke Wo Contrast  Result Date: 07/05/2017 CLINICAL DATA:  Code stroke. Right hemiparesis and difficulty speaking. EXAM: CT HEAD WITHOUT CONTRAST TECHNIQUE: Contiguous axial images were obtained  from the base of the skull through the vertex without intravenous contrast. COMPARISON:  Brain MRI 04/27/2016 FINDINGS: Brain: There is loss of gray-white differentiation consistent with acute infarction involving the posterior left insula and left parietal lobe extending towards the vertex. No acute intracranial hemorrhage, mass, midline shift, or extra-axial fluid collection is identified. Chronic lacunar infarcts are present in the basal ganglia and thalami bilaterally. There is a chronic right temporoparietal infarct. There is a small chronic left cerebellar infarct. Patchy cerebral white matter hypodensities are nonspecific but compatible with chronic small vessel ischemic disease, moderately advanced for age. Mild cerebral atrophy is advanced for age. Vascular: Calcified atherosclerosis at the skull base. No hyperdense vessel. Skull: No fracture or focal osseous lesion. Sinuses/Orbits: Visualized paranasal sinuses and mastoid air cells are clear. Orbits are unremarkable. Other: None. ASPECTS [facility] Stroke Program Early CT Score) - Ganglionic level infarction (caudate, lentiform nuclei, internal capsule, insula, M1-M3 cortex): 5-6 - Supraganglionic infarction (M4-M6 cortex): 2 Total score (0-10 with 10 being normal): 7-8 IMPRESSION: 1. Acute left MCA infarct involving the insula and parietal lobe. No hemorrhage. 2. ASPECTS is 7-8. 3. Age advanced chronic ischemia. These results were communicated to Dr. Leonel Ramsay at 9:54 pm on 07/05/2017 by text page via the [facility] messaging system. Electronically Signed   By: Logan Bores M.D.   On: 07/05/2017 21:59    ROS: Unable to complete ROS due to patient mental status.    Physical Exam: Vitals:   07/06/17 0800 07/06/17 0900 07/06/17 1009 07/06/17 1100  BP: (!) 138/93 (!) 154/92 139/70 123/84  Pulse: 82 82 72 (!) 56  Resp: 18 18  (!) 23  Temp: (!) 97.4 F (36.3 C)     TempSrc: Oral     SpO2: 100% 96%  90%  Weight:      Height:         General: NAD,  chronically ill appearing male Head: NCAT, sclera not icteric, MMM Neck: Supple. No lymphadenopathy. No JVD Lungs: CTA bilaterally. No wheeze, rales or rhonchi. Breathing is unlabored. Heart: Regular rate, irregular rhythm. No murmur, rubs or gallops.  Abdomen: soft, nontender, +BS, no guarding, no rebound tenderness  Lower extremities:no edema, ischemic changes, or open wounds  Neuro: Alert. Moves all extremities spontaneously.. Dialysis Access: LU AVF +b/t  Dialysis Orders:  MWF - [facility]  4.5hrs, BFR 400, DFR 800,  EDW 89kg, 2K/ 2.5Ca  Access: LU AVF  Heparin 4000 unit bolus w/2000 Unit midrun Venofer '50mg'$  IV qwk Hectorol 3 mcg IV qHD    Home meds: ASA/atorvastatin/clopidogrel/nitro Clonidine 0.2 BID/metoprolol tartrate '25mg'$  BID/terazosin [name] '1000mg'$  2AC TID, and 1BID snacks Uloric '40mg'$  qd   Assessment/Plan: 1.  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 Estanislado Pandy. CTA showed diffuse intracranial stenosis. Post intervention MRI and MRA completed today. Per neuro/IR. 2.  ESRD -  HD orders written for today, continue on regular MWF schedule. K 3.5. Holding heparin. 3.  Hypertension/volume  - BP variable, mostly well controlled. Continue current meds.  Has been losing weight, EDW just lowered as OP, 2kg under that wt if recorded weights correct. Titrate down volume as tolerated.  4.  Anemia of CKD - Hgb 11.4, no ESA indicated at this time.  5.  Secondary Hyperparathyroidism -  Ca  elevated. Lower VDRA. Continue binders.  6.  Nutrition - Renal diet w/fluid restrictions.  7. CHF w/cardiomyopathy - EF 35-40% in 02/2107.  TEE pending.  8. HLD - now on Lipitor - per primary  Jen Mow, PA-C [facility] Pager: [phone] 07/06/2017, 12:56 PM

## 2017-07-06 NOTE — Plan of Care (Signed)
Patient was cleared by speech for a renal diet.  Patient needs assistance with meals. Will continue to monitor.  Heloise Purpura RN

## 2017-07-06 NOTE — Evaluation (Signed)
Occupational Therapy Evaluation Patient Details Name: [name] MRN: [id number] DOB: [birth year] Today's Date: 07/06/2017    History of Present Illness 57 y.o. male with a history of ESRD, previous stroke. Presenting with new right weakness, aphasia, and neglect. MRI showing acute left MCA territory cortical infarct    Clinical Impression   PTA, pt was living alone and was independent per chart review. Pt poor historian and unable to provide home set up and PLOF due to cognition and communication deficits. Pt currently requiring Mod A for grooming, Max A for bathing/dressing, and Mod A +2 for functional transfers. Pt presenting with decreased cognition, balance, strength, and functional use of RUE. Pt will required further acute OT to facilitate safe dc and increase independence with ADLs. Recommend dc to CIR for intensive OT to optimize safety and independence with ADLs and functional mobility as well as decrease caregiver burden.    Follow Up Recommendations  CIR;Supervision/Assistance - 24 hour    Equipment Recommendations  Other (comment)(Defer to next venue)    Recommendations for Other Services Rehab consult;PT consult;Speech consult     Precautions / Restrictions Precautions Precautions: Fall      Mobility Bed Mobility Overal bed mobility: Needs Assistance Bed Mobility: Supine to Sit     Supine to sit: +2 for physical assistance;Mod assist     General bed mobility comments: Mod A +2 for bringing BLEs towards EOB and then elevating trunk. Pt with decreased initiation and requiring Max tactile cues  Transfers Overall transfer level: Needs assistance Equipment used: 2 person hand held assist Transfers: Sit to/from [employer] Sit to Stand: Min assist;+2 physical assistance Stand pivot transfers: Mod assist;+2 physical assistance       General transfer comment: Min A +2 for steadying and balance during sit<>stand. Pt requiring Mod A +2 for stand  pivot to recliner with tactile cues to initiate movement of LLE and physical A to prevent left lean.    Balance Overall balance assessment: Needs assistance Sitting-balance support: No upper extremity supported;Feet supported Sitting balance-Leahy Scale: Fair     Standing balance support: No upper extremity supported;During functional activity Standing balance-Leahy Scale: Poor Standing balance comment: Requring physical A for right lateral lean                           ADL either performed or assessed with clinical judgement   ADL Overall ADL's : Needs assistance/impaired Eating/Feeding: Minimal assistance;Sitting Eating/Feeding Details (indicate cue type and reason): Pt performing self feeding and sinking with Minimal assistance. Pt bringing spoon to mouth to eat pudding with LUE. Pt pocketing food; RN notified Grooming: Moderate assistance;Sitting   Upper Body Bathing: Maximal assistance;Sitting   Lower Body Bathing: Maximal assistance;Sit to/from stand   Upper Body Dressing : Maximal assistance;Sitting   Lower Body Dressing: Maximal assistance;Sit to/from stand Lower Body Dressing Details (indicate cue type and reason): Max A to don socks at bed level and elevating his feet. Mod A for standing balance Toilet Transfer: Moderate assistance;+2 for physical assistance;Stand-pivot Toilet Transfer Details (indicate cue type and reason): Pt performing stand pivot to recliner with Mod A +2 with physical A to weight shift.          Functional mobility during ADLs: Moderate assistance;+2 for physical assistance(stand pivot only) General ADL Comments: Pt with poor cognition, balance, strength, and activity tolerance.     Vision Baseline Vision/History: (Pt reports he wears glasses for reading. Unsure of accuracy) Additional Comments:  Difficult to assess due to cognition. Need to further assess     Perception     Praxis      Pertinent Vitals/Pain Pain Assessment:  Faces Faces Pain Scale: Hurts little more Pain Location: Moving RUE Pain Intervention(s): Monitored during session;Limited activity within patient's tolerance;Repositioned     Hand Dominance Right(From chart review)   Extremity/Trunk Assessment Upper Extremity Assessment Upper Extremity Assessment: LUE deficits/detail;RUE deficits/detail RUE Deficits / Details: Pt spontaneously moving RUE with decreased cooridnation and ROM. Pt not moving RUE with cues and demonstrating poor motor control and coorindation. Neglect of right UE RUE Coordination: decreased fine motor;decreased gross motor LUE Deficits / Details: Decreased coordination and strength. Able to bring hand to mouth during self feeding task and drinking.  LUE Coordination: decreased fine motor;decreased gross motor   Lower Extremity Assessment Lower Extremity Assessment: Defer to PT evaluation   Cervical / Trunk Assessment Cervical / Trunk Assessment: Other exceptions Cervical / Trunk Exceptions: Lateral lean to right   Communication Communication Communication: No difficulties   Cognition Arousal/Alertness: Awake/alert Behavior During Therapy: Flat affect Overall Cognitive Status: Impaired/Different from baseline Area of Impairment: Attention;Memory;Following commands;Safety/judgement;Awareness;Problem solving                   Current Attention Level: Focused Memory: Decreased short-term memory;Decreased recall of precautions Following Commands: Follows one step commands inconsistently Safety/Judgement: Decreased awareness of safety;Decreased awareness of deficits Awareness: Intellectual Problem Solving: Slow processing;Decreased initiation;Difficulty sequencing;Requires verbal cues;Requires tactile cues General Comments: Difficulty following simple, direct commands. Pt able to perform AAROM. However, not initiating movement with verbal cues.   General Comments       Exercises     Shoulder Instructions       Home Living Family/patient expects to be discharged to:: Private residence Living Arrangements: Alone                               Additional Comments: Pt poor historian. Pt confirms he lives alone. Unsure of accuracy.      Prior Functioning/Environment Level of Independence: Independent        Comments: Poor historian        OT Problem List: Decreased strength;Decreased range of motion;Decreased activity tolerance;Impaired balance (sitting and/or standing);Decreased coordination;Decreased cognition;Decreased safety awareness;Decreased knowledge of use of DME or AE;Decreased knowledge of precautions;Impaired UE functional use;Pain      OT Treatment/Interventions: Self-care/ADL training;Therapeutic exercise;Energy conservation;DME and/or AE instruction;Therapeutic activities;Patient/family education;Cognitive remediation/compensation;Balance training    OT Goals(Current goals can be found in the care plan section) Acute Rehab OT Goals Patient Stated Goal: Unstated' OT Goal Formulation: Patient unable to participate in goal setting Time For Goal Achievement: 07/20/17 Potential to Achieve Goals: Good  OT Frequency: Min 2X/week   Barriers to D/C:            Co-evaluation              AM-PAC PT "6 Clicks" Daily Activity     Outcome Measure Help from another person eating meals?: A Little Help from another person taking care of personal grooming?: A Lot Help from another person toileting, which includes using toliet, bedpan, or urinal?: A Lot Help from another person bathing (including washing, rinsing, drying)?: A Lot Help from another person to put on and taking off regular upper body clothing?: A Little Help from another person to put on and taking off regular lower body clothing?: A Lot 6 Click Score: 14  End of Session Nurse Communication: Mobility status  Activity Tolerance: Patient tolerated treatment well Patient left: in chair;with call  bell/phone within reach;with chair alarm set  OT Visit Diagnosis: Unsteadiness on feet (R26.81);Other abnormalities of gait and mobility (R26.89);Other symptoms and signs involving cognitive function                Time: 1336-1402 OT Time Calculation (min): 26 min Charges:  OT General Charges $OT Visit: 1 Visit OT Evaluation $OT Eval Moderate Complexity: 1 Mod G-Codes:     Dominiq Fontaine MSOT, OTR/L Acute Rehab Pager: [phone] Office: [phone]  Theodoro Grist Aizley Stenseth 07/06/2017, 2:58 PM

## 2017-07-06 NOTE — Progress Notes (Signed)
STROKE TEAM PROGRESS NOTE   SUBJECTIVE (INTERVAL HISTORY) His RN is at the bedside.  Extubated and tolerating well.  Overall he feels his condition is gradually improving. He still has right hemiparesis, global aphasia.  However, passing swallow.  Nephrology plan for dialysis today.   OBJECTIVE Temp:  [97 F (36.1 C)-97.6 F (36.4 C)] 97.4 F (36.3 C) (04/22 0800) Pulse Rate:  [62-147] 82 (04/22 0800) Cardiac Rhythm: Normal sinus rhythm (04/22 0400) Resp:  [13-23] 18 (04/22 0800) BP: (111-162)/(73-99) 138/93 (04/22 0800) SpO2:  [91 %-100 %] 100 % (04/22 0800) Arterial Line BP: (103-138)/(59-103) 138/80 (04/22 0800) Weight:  [191 lb 5.8 oz (86.8 kg)] 191 lb 5.8 oz (86.8 kg) (04/22 0145)  Recent Labs  Lab 07/05/17 2228 07/06/17 0158  GLUCAP 125* 97   Recent Labs  Lab 07/05/17 2215 07/05/17 2232 07/06/17 0510  NA 136 133* 133*  K 4.0 4.8 3.5  CL 94* 98* 101  CO2 20*  --  18*  GLUCOSE 135* 130* 106*  BUN 39* 48* 35*  CREATININE 14.46* 14.80* 13.49*  CALCIUM 10.7*  --  9.6   Recent Labs  Lab 07/05/17 2215  AST 15  ALT 9*  ALKPHOS 40  BILITOT 1.0  PROT 7.4  ALBUMIN 3.9   Recent Labs  Lab 07/05/17 2215 07/05/17 2232 07/06/17 0510  WBC 14.8*  --  12.0*  NEUTROABS 12.7*  --  9.0*  HGB 13.2 12.6* 11.4*  HCT 38.9* 37.0* 32.9*  MCV 92.4  --  91.1  PLT 191  --  197   No results for input(s): CKTOTAL, CKMB, CKMBINDEX, TROPONINI in the last 168 hours. Recent Labs    07/05/17 2215  LABPROT 14.7  INR 1.16   No results for input(s): COLORURINE, LABSPEC, PHURINE, GLUCOSEU, HGBUR, BILIRUBINUR, KETONESUR, PROTEINUR, UROBILINOGEN, NITRITE, LEUKOCYTESUR in the last 72 hours.  Invalid input(s): APPERANCEUR     Component Value Date/Time   CHOL 180 07/06/2017 0510   CHOL 208 (H) 03/19/2017 1152   TRIG 98 07/06/2017 0510   HDL 29 (L) 07/06/2017 0510   HDL 40 03/19/2017 1152   CHOLHDL 6.2 07/06/2017 0510   VLDL 20 07/06/2017 0510   LDLCALC 131 (H) 07/06/2017 0510    LDLCALC 139 (H) 03/19/2017 1152   Lab Results  Component Value Date   HGBA1C 4.6 (L) 07/06/2017      Component Value Date/Time   LABOPIA NONE DETECTED 12/30/2013 1003   COCAINSCRNUR NONE DETECTED 12/30/2013 1003   LABBENZ POSITIVE (A) 12/30/2013 1003   AMPHETMU NONE DETECTED 12/30/2013 1003   THCU NONE DETECTED 12/30/2013 1003   LABBARB NONE DETECTED 12/30/2013 1003    No results for input(s): ETH in the last 168 hours.  I have personally reviewed the radiological images below and agree with the radiology interpretations.  Ct Angio Head W Or Wo Contrast  Result Date: 07/05/2017 CLINICAL DATA:  Right hemiparesis and difficulty speaking. EXAM: CT ANGIOGRAPHY HEAD AND NECK CT PERFUSION BRAIN TECHNIQUE: Multidetector CT imaging of the head and neck was performed using the standard protocol during bolus administration of intravenous contrast. Multiplanar CT image reconstructions and MIPs were obtained to evaluate the vascular anatomy. Carotid stenosis measurements (when applicable) are obtained utilizing NASCET criteria, using the distal internal carotid diameter as the denominator. Multiphase CT imaging of the brain was performed following IV bolus contrast injection. Subsequent parametric perfusion maps were calculated using RAPID software. CONTRAST:  ISOVUE-370 IOPAMIDOL (ISOVUE-370) INJECTION 76% COMPARISON:  Head MRA 12/29/2013.  Neck MRA 06/30/2005 FINDINGS:  CTA NECK FINDINGS Aortic arch: Standard 3 vessel aortic arch. Widely patent arch vessel origins. Right carotid system: Patent with predominantly soft plaque in the common carotid artery resulting in less than 50% narrowing. Minimal calcified plaque at the carotid bifurcation. No ICA stenosis. Left carotid system: Patent with predominantly soft plaque in the distal common carotid artery resulting in 50% stenosis. Moderate calcified plaque about the carotid bifurcation. No ICA stenosis. Vertebral arteries: Patent with the left being  slightly dominant. Mild proximal right V2 stenosis due to calcified plaque. Calcified and soft plaque in the left V1 segment with mild multifocal stenoses. Skeleton: Mild cervical disc degeneration. Other neck: Multiple thyroid nodules including a 2.9 cm exophytic. Nodule extending inferiorly from the isthmus. Upper chest: Clear lung apices. Review of the MIP images confirms the above findings CTA HEAD FINDINGS Anterior circulation: The internal carotid arteries are patent from skull base to carotid termini with calcified plaque resulting in mild cavernous and supraclinoid stenosis on the right. The M1 segments are patent with mild stenosis on the right. The left M2 inferior division is occluded at its origin with some reconstitution of more distal M2 and M3 branches, however there are numerous missing distal branches in the left parietal region corresponding to the acute infarct. The prior MRA also suggested a severe stenosis or possibly short segment occlusion of the left M2 inferior division, however motion artifact on that study makes it difficult to make a detailed assessment of interval changes. There are also likely some missing distal left MCA superior division branch vessels. There also severe proximal right M2 stenoses which are at least partially chronic. The right A1 segment is occluded. The left A1 segment is patent but demonstrates a severe proximal stenosis. Both A2 segments are heavily diseased with multifocal severe stenoses and or short segment occlusions. The appearance of the ACAs is grossly similar to the prior motion degraded MRA. No aneurysm. Posterior circulation: The intracranial vertebral arteries are patent to the basilar. There is right greater than left V4 segment calcified plaque without significant stenosis. Patent SCA origins are identified bilaterally. PICA and AICA origins are not well evaluated. The basilar artery is widely patent. Posterior communicating arteries are diminutive or  absent. PCAs are patent with mild left P2 stenosis. No aneurysm. Venous sinuses: Inadequately assessed due to arterial phase contrast timing. Anatomic variants: None. Delayed phase: Not performed. Review of the MIP images confirms the above findings CT Brain Perfusion Findings: CBF (<30%) Volume: 28mL Perfusion (Tmax>6.0s) volume: , however a substantial amount of this is located in the right cerebral hemisphere Mismatch Volume: though with the caveat that this is artifactually inflated due to (likely chronic) prolonged perfusion in the right cerebral hemisphere. There is still however an apparent sizable penumbra in the posterior left cerebral hemisphere. Infarction Location:Predominantly left parietal lobe with a small amount of posterior left frontal lobe, posterior left temporal lobe, and posterior insula involvement. IMPRESSION: 1. 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 2. Left M2 inferior division occlusion at its origin with some distal reconstitution. Severe stenosis (or possibly short segment occlusion) in this location on a prior MRA. Numerous missing distal branch vessels in the left parietal region corresponding to the acute infarct. 3. Advanced intracranial atherosclerosis with numerous severe stenoses as above. 4. 50% left common carotid artery stenosis. 5.  Aortic Atherosclerosis (ICD10-I70.0). 6. Thyroid nodules measuring up to 2.9 cm. Nonemergent thyroid ultrasound recommended for further evaluation. These results were communicated to Dr. Amada Jupiter at 10:18 pm on  07/05/2017 by text page via the [facility] messaging system. Electronically Signed   By: Sebastian Ache M.D.   On: 07/05/2017 22:47   Ct Head Wo Contrast  Result Date: 07/06/2017 CLINICAL DATA:  Left MCA infarct status post intervention EXAM: CT HEAD WITHOUT CONTRAST TECHNIQUE: Contiguous axial images were obtained from the base of the skull through the vertex without intravenous contrast. COMPARISON:  Cerebral  angiogram 07/05/2017 CTA head neck 07/05/2017 FINDINGS: Brain: There is hyperdensity within the left basal ganglia, likely indicating contrast staining. No midline shift or mass effect. No hydrocephalus. Unchanged right temporal lobe encephalomalacia. Vascular: There is intravascular contrast material related to prior an angiographic study. Skull: Normal visualized skull base, calvarium and extracranial soft tissues. Sinuses/Orbits: No sinus fluid levels or advanced mucosal thickening. No mastoid effusion. Normal orbits. IMPRESSION: Hyperdensity of the left basal ganglia is favored to represent contrast staining over petechial hemorrhage. Electronically Signed   By: Deatra Robinson M.D.   On: 07/06/2017 05:56   Ct Angio Neck W Or Wo Contrast  Result Date: 07/05/2017 CLINICAL DATA:  Right hemiparesis and difficulty speaking. EXAM: CT ANGIOGRAPHY HEAD AND NECK CT PERFUSION BRAIN TECHNIQUE: Multidetector CT imaging of the head and neck was performed using the standard protocol during bolus administration of intravenous contrast. Multiplanar CT image reconstructions and MIPs were obtained to evaluate the vascular anatomy. Carotid stenosis measurements (when applicable) are obtained utilizing NASCET criteria, using the distal internal carotid diameter as the denominator. Multiphase CT imaging of the brain was performed following IV bolus contrast injection. Subsequent parametric perfusion maps were calculated using RAPID software. CONTRAST:  ISOVUE-370 IOPAMIDOL (ISOVUE-370) INJECTION 76% COMPARISON:  Head MRA 12/29/2013.  Neck MRA 06/30/2005 FINDINGS: CTA NECK FINDINGS Aortic arch: Standard 3 vessel aortic arch. Widely patent arch vessel origins. Right carotid system: Patent with predominantly soft plaque in the common carotid artery resulting in less than 50% narrowing. Minimal calcified plaque at the carotid bifurcation. No ICA stenosis. Left carotid system: Patent with predominantly soft plaque in the distal  common carotid artery resulting in 50% stenosis. Moderate calcified plaque about the carotid bifurcation. No ICA stenosis. Vertebral arteries: Patent with the left being slightly dominant. Mild proximal right V2 stenosis due to calcified plaque. Calcified and soft plaque in the left V1 segment with mild multifocal stenoses. Skeleton: Mild cervical disc degeneration. Other neck: Multiple thyroid nodules including a 2.9 cm exophytic. Nodule extending inferiorly from the isthmus. Upper chest: Clear lung apices. Review of the MIP images confirms the above findings CTA HEAD FINDINGS Anterior circulation: The internal carotid arteries are patent from skull base to carotid termini with calcified plaque resulting in mild cavernous and supraclinoid stenosis on the right. The M1 segments are patent with mild stenosis on the right. The left M2 inferior division is occluded at its origin with some reconstitution of more distal M2 and M3 branches, however there are numerous missing distal branches in the left parietal region corresponding to the acute infarct. The prior MRA also suggested a severe stenosis or possibly short segment occlusion of the left M2 inferior division, however motion artifact on that study makes it difficult to make a detailed assessment of interval changes. There are also likely some missing distal left MCA superior division branch vessels. There also severe proximal right M2 stenoses which are at least partially chronic. The right A1 segment is occluded. The left A1 segment is patent but demonstrates a severe proximal stenosis. Both A2 segments are heavily diseased with multifocal severe stenoses and or  short segment occlusions. The appearance of the ACAs is grossly similar to the prior motion degraded MRA. No aneurysm. Posterior circulation: The intracranial vertebral arteries are patent to the basilar. There is right greater than left V4 segment calcified plaque without significant stenosis. Patent SCA  origins are identified bilaterally. PICA and AICA origins are not well evaluated. The basilar artery is widely patent. Posterior communicating arteries are diminutive or absent. PCAs are patent with mild left P2 stenosis. No aneurysm. Venous sinuses: Inadequately assessed due to arterial phase contrast timing. Anatomic variants: None. Delayed phase: Not performed. Review of the MIP images confirms the above findings CT Brain Perfusion Findings: CBF (<30%) Volume: 28mL Perfusion (Tmax>6.0s) volume: , however a substantial amount of this is located in the right cerebral hemisphere Mismatch Volume: though with the caveat that this is artifactually inflated due to (likely chronic) prolonged perfusion in the right cerebral hemisphere. There is still however an apparent sizable penumbra in the posterior left cerebral hemisphere. Infarction Location:Predominantly left parietal lobe with a small amount of posterior left frontal lobe, posterior left temporal lobe, and posterior insula involvement. IMPRESSION: 1. 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 2. Left M2 inferior division occlusion at its origin with some distal reconstitution. Severe stenosis (or possibly short segment occlusion) in this location on a prior MRA. Numerous missing distal branch vessels in the left parietal region corresponding to the acute infarct. 3. Advanced intracranial atherosclerosis with numerous severe stenoses as above. 4. 50% left common carotid artery stenosis. 5.  Aortic Atherosclerosis (ICD10-I70.0). 6. Thyroid nodules measuring up to 2.9 cm. Nonemergent thyroid ultrasound recommended for further evaluation. These results were communicated to Dr. Amada Jupiter at 10:18 pm on 07/05/2017 by text page via the [facility] messaging system. Electronically Signed   By: Sebastian Ache M.D.   On: 07/05/2017 22:47   Ct Cerebral Perfusion W Contrast  Result Date: 07/05/2017 CLINICAL DATA:  Right hemiparesis and difficulty  speaking. EXAM: CT ANGIOGRAPHY HEAD AND NECK CT PERFUSION BRAIN TECHNIQUE: Multidetector CT imaging of the head and neck was performed using the standard protocol during bolus administration of intravenous contrast. Multiplanar CT image reconstructions and MIPs were obtained to evaluate the vascular anatomy. Carotid stenosis measurements (when applicable) are obtained utilizing NASCET criteria, using the distal internal carotid diameter as the denominator. Multiphase CT imaging of the brain was performed following IV bolus contrast injection. Subsequent parametric perfusion maps were calculated using RAPID software. CONTRAST:  ISOVUE-370 IOPAMIDOL (ISOVUE-370) INJECTION 76% COMPARISON:  Head MRA 12/29/2013.  Neck MRA 06/30/2005 FINDINGS: CTA NECK FINDINGS Aortic arch: Standard 3 vessel aortic arch. Widely patent arch vessel origins. Right carotid system: Patent with predominantly soft plaque in the common carotid artery resulting in less than 50% narrowing. Minimal calcified plaque at the carotid bifurcation. No ICA stenosis. Left carotid system: Patent with predominantly soft plaque in the distal common carotid artery resulting in 50% stenosis. Moderate calcified plaque about the carotid bifurcation. No ICA stenosis. Vertebral arteries: Patent with the left being slightly dominant. Mild proximal right V2 stenosis due to calcified plaque. Calcified and soft plaque in the left V1 segment with mild multifocal stenoses. Skeleton: Mild cervical disc degeneration. Other neck: Multiple thyroid nodules including a 2.9 cm exophytic. Nodule extending inferiorly from the isthmus. Upper chest: Clear lung apices. Review of the MIP images confirms the above findings CTA HEAD FINDINGS Anterior circulation: The internal carotid arteries are patent from skull base to carotid termini with calcified plaque resulting in mild cavernous and supraclinoid  stenosis on the right. The M1 segments are patent with mild stenosis on the  right. The left M2 inferior division is occluded at its origin with some reconstitution of more distal M2 and M3 branches, however there are numerous missing distal branches in the left parietal region corresponding to the acute infarct. The prior MRA also suggested a severe stenosis or possibly short segment occlusion of the left M2 inferior division, however motion artifact on that study makes it difficult to make a detailed assessment of interval changes. There are also likely some missing distal left MCA superior division branch vessels. There also severe proximal right M2 stenoses which are at least partially chronic. The right A1 segment is occluded. The left A1 segment is patent but demonstrates a severe proximal stenosis. Both A2 segments are heavily diseased with multifocal severe stenoses and or short segment occlusions. The appearance of the ACAs is grossly similar to the prior motion degraded MRA. No aneurysm. Posterior circulation: The intracranial vertebral arteries are patent to the basilar. There is right greater than left V4 segment calcified plaque without significant stenosis. Patent SCA origins are identified bilaterally. PICA and AICA origins are not well evaluated. The basilar artery is widely patent. Posterior communicating arteries are diminutive or absent. PCAs are patent with mild left P2 stenosis. No aneurysm. Venous sinuses: Inadequately assessed due to arterial phase contrast timing. Anatomic variants: None. Delayed phase: Not performed. Review of the MIP images confirms the above findings CT Brain Perfusion Findings: CBF (<30%) Volume: 28mL Perfusion (Tmax>6.0s) volume: , however a substantial amount of this is located in the right cerebral hemisphere Mismatch Volume: though with the caveat that this is artifactually inflated due to (likely chronic) prolonged perfusion in the right cerebral hemisphere. There is still however an apparent sizable penumbra in the posterior left  cerebral hemisphere. Infarction Location:Predominantly left parietal lobe with a small amount of posterior left frontal lobe, posterior left temporal lobe, and posterior insula involvement. IMPRESSION: 1. 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 2. Left M2 inferior division occlusion at its origin with some distal reconstitution. Severe stenosis (or possibly short segment occlusion) in this location on a prior MRA. Numerous missing distal branch vessels in the left parietal region corresponding to the acute infarct. 3. Advanced intracranial atherosclerosis with numerous severe stenoses as above. 4. 50% left common carotid artery stenosis. 5.  Aortic Atherosclerosis (ICD10-I70.0). 6. Thyroid nodules measuring up to 2.9 cm. Nonemergent thyroid ultrasound recommended for further evaluation. These results were communicated to Dr. Amada Jupiter at 10:18 pm on 07/05/2017 by text page via the [facility] messaging system. Electronically Signed   By: Sebastian Ache M.D.   On: 07/05/2017 22:47   Dg Chest Port 1 View  Result Date: 07/06/2017 CLINICAL DATA:  Cardiomyopathy, coronary artery disease with stent placement, chronic renal insufficiency. EXAM: PORTABLE CHEST 1 VIEW COMPARISON:  Chest x-ray of March 04, 2017 FINDINGS: The lungs are borderline hypoinflated but clear. The heart is top-normal in size. The pulmonary vascularity is normal. There is no pleural effusion. There are post CABG changes. There is a vascular graft in the left axillary region. The bony thorax exhibits no acute abnormality. IMPRESSION: Top-normal cardiac size without pulmonary edema or pleural effusion. No pneumonia. Electronically Signed   By: David  [place] M.D.   On: 07/06/2017 07:57   Ct Head Code Stroke Wo Contrast  Result Date: 07/05/2017 CLINICAL DATA:  Code stroke. Right hemiparesis and difficulty speaking. EXAM: CT HEAD WITHOUT CONTRAST TECHNIQUE: Contiguous axial images were  obtained from the base of the skull through the  vertex without intravenous contrast. COMPARISON:  Brain MRI 04/27/2016 FINDINGS: Brain: There is loss of gray-white differentiation consistent with acute infarction involving the posterior left insula and left parietal lobe extending towards the vertex. No acute intracranial hemorrhage, mass, midline shift, or extra-axial fluid collection is identified. Chronic lacunar infarcts are present in the basal ganglia and thalami bilaterally. There is a chronic right temporoparietal infarct. There is a small chronic left cerebellar infarct. Patchy cerebral white matter hypodensities are nonspecific but compatible with chronic small vessel ischemic disease, moderately advanced for age. Mild cerebral atrophy is advanced for age. Vascular: Calcified atherosclerosis at the skull base. No hyperdense vessel. Skull: No fracture or focal osseous lesion. Sinuses/Orbits: Visualized paranasal sinuses and mastoid air cells are clear. Orbits are unremarkable. Other: None. ASPECTS [facility] Stroke Program Early CT Score) - Ganglionic level infarction (caudate, lentiform nuclei, internal capsule, insula, M1-M3 cortex): 5-6 - Supraganglionic infarction (M4-M6 cortex): 2 Total score (0-10 with 10 being normal): 7-8 IMPRESSION: 1. Acute left MCA infarct involving the insula and parietal lobe. No hemorrhage. 2. ASPECTS is 7-8. 3. Age advanced chronic ischemia. These results were communicated to Dr. Amada Jupiter at 9:54 pm on 07/05/2017 by text page via the [facility] messaging system. Electronically Signed   By: Sebastian Ache M.D.   On: 07/05/2017 21:59   MRI and MRA pending  TTE pending   PHYSICAL EXAM  Temp:  [97 F (36.1 C)-97.6 F (36.4 C)] 97.4 F (36.3 C) (04/22 0800) Pulse Rate:  [62-147] 82 (04/22 0800) Resp:  [13-23] 18 (04/22 0800) BP: (111-162)/(73-99) 138/93 (04/22 0800) SpO2:  [91 %-100 %] 100 % (04/22 0800) Arterial Line BP: (103-138)/(59-103) 138/80 (04/22 0800) Weight:  [191 lb 5.8 oz (86.8 kg)] 191 lb 5.8 oz (86.8  kg) (04/22 0145)  General - Well nourished, well developed, in no apparent distress.  Ophthalmologic - fundi not visualized due to noncooperation.  Cardiovascular - Regular rate and rhythm with no murmur.  Neuro -awake alert, global aphasia, able to say yes and no only, able to follow commands on eye closing and opening, did not follow any other commands.  Left gaze preference, able to cross midline and attending to right side, bilateral gaze incomplete.  Blinking to visual threat on the left, however not blinking to the right.  No significant right-sided neglect however.  PERRL, right facial droop.  Tongue midline inside mouth.  Right UE flaccid on pain stimulation. RLE 2/5 withdrawal on pain summation. LUE and LLE purposeful movement and against gravity.  DTR 1+ and no Babinski. Sensation, coordination not cooperative and gait not tested.   ASSESSMENT/PLAN [name] is a 57 y.o. male with history of CAD/MI s/p stenting and CABG, cardiomyopathy with EF 35-40%, hypertension, hyperlipidemia, cognitive impairment, obesity, seizure, OSA, ESRD on hemodialysis admitted for AMS and right-sided weakness. No tPA given due to outside window.    Stroke:  left MCA infarct, likely due to large vessel atherosclerosis.  Chronic intracranial stenosis including bilateral MCAs since 2015.   Resultant global aphasia, right hemiplegia  CT left MCA infarct  CTA head and neck left CCA 50% stenosis left M2 cutoff and diffuse intracranial stenosis  IR s/p IA Integrilin and mechanical thrombolysis with microguidewire  MRI pending  MRA pending  2D Echo pending  LDL 131  HgbA1c 4.6  SCDs for VTE prophylaxis  Fall precautions  Diet regular Room service appropriate? Yes; Fluid consistency: Thin   aspirin 81 mg daily  and clopidogrel 75 mg daily prior to admission, now on clopidogrel 75 mg daily.   Patient counseled to be compliant with his antithrombotic medications  Ongoing aggressive  stroke risk factor management  Therapy recommendations: Pending  Disposition: Pending  History of stroke  12/2013, admitted for hypotension after HD, MRI showed left caudate head acute infarct, chronic bilateral BG and pontine infarcts.  MRA severe ICAD involving bilateral MCAs.  Carotid Doppler negative.  LDL 89 and A1c 5.6.  Discharged with dual antiplatelet and Crestor 40  Follow with Dr. Terrace Arabia at [facility] after  CHF with cardiomyopathy  EF 35 to 40% in 02/2017  TTE pending this time  Gentle hydration and HD as planned  ESRD on HD  Nephrology on board  HD scheduled MWF  Plan for HD today  Cre 13.49  Hypertension Stable BP goal 120-140 postprocedure  On Cardene  Off Cardene as able  Resume p.o. clonidine and metoprolol  Hyperlipidemia  Home meds: Lipitor 40  LDL 131, goal < 70  Now on Lipitor 80  Continue statin at discharge  Other Stroke Risk Factors  CAD/MI - s/p stenting and CABG  Obstructive sleep apnea, on CPAP at home  Other Active Problems  Cognitive impairment - following with Dr. Terrace Arabia at [facility]  ?? Seizure, not on meds  Hospital day # 1  This patient is critically ill due to left MCA stroke, ESRD on HD, CHF, severe intracranial stenosis and at significant risk of neurological worsening, death form recurrent stroke, hemorrhagic conversion, seizure, renal failure, heart failure. This patient's care requires constant monitoring of vital signs, hemodynamics, respiratory and cardiac monitoring, review of multiple databases, neurological assessment, discussion with family, other specialists and medical decision making of high complexity. I spent 45 minutes of neurocritical care time in the care of this patient.  Marvel Plan, MD PhD Stroke Neurology 07/06/2017 9:35 AM    To contact Stroke Continuity provider, please refer to [email or web address]. After hours, contact General Neurology

## 2017-07-06 NOTE — Code Documentation (Signed)
57 yo code stroke from home via [facility] to [facility] ED with new R weakness, aphasia and neglect.  LSW was Friday 4/19.  Reportedly pt lives alone.  Someone went to check on him at 1800 tonight and he said he would be right there and did not come to the door.  Dr. Amada Jupiter at bedside. LSW unknown.  NIHSS 22.  LVO +.  CTA/P done.  IR team activated.  Pt transported to IR.  Sister at bedside and updated by Dr. Amada Jupiter.

## 2017-07-06 NOTE — Progress Notes (Signed)
  Echocardiogram 2D Echocardiogram with definity has been performed.  Leta Jungling M 07/06/2017, 10:04 AM

## 2017-07-06 NOTE — Patient Outreach (Signed)
[facility]) Care Management  07/06/2017  MOHSEN HELMER 29-Aug-1960 [id number]    Telephone Screen  Referral Date:06/23/17 Referral Source:Clinical wellness Coach-Population Health Referral Reason: "request that [facility] be involved with patient ongoing. Has history of mini strokes, staff at dialysis center concerned with changes in mental status, forgetting to arrange transportation to dialysis on wrong days, increasingly disinhibited behaviors, pt displayed bowel incontinence episode at clinic and had no apparent awareness, pt has dialysis M,W,F morning shift" Insurance:Medicare    RN CM received Hospital ADT alert that patient admitted to hospital on 07/05/17 fr Code Stroke. Notification sent to [facility] hospital liaisons to request follow up. RN CM will close case out at this time as patient inpatient.   [name], RN,BSN,CCM [facility] Care Management Telephonic Care Management Coordinator Direct Phone: [phone] Toll Free: [phone] Fax: [phone]

## 2017-07-06 NOTE — Progress Notes (Addendum)
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 Patient laying comfortably in bed, demonstrates global aphasia and right facial droop. No spontaneous movement of right upper extremity, right lower extremity withdraws from pain. Right groin incision c/d/i.  CT head 07/06/2017: 1. Hyperdensity of the left basal ganglia is favored to represent contrast staining over petechial hemorrhage.  Allergies: Shrimp [shellfish allergy]; Atorvastatin; Insulins; Simvastatin; and Ultram [tramadol]  Medications: Prior to Admission medications   Medication Sig Start Date End Date Taking? Authorizing Provider  amLODipine (NORVASC) 5 MG tablet TAKE ONE TABLET BY MOUTH ONE TIME DAILY  12/08/16   Runell Gess, MD  aspirin EC 81 MG EC tablet Take 1 tablet (81 mg total) by mouth daily. 07/07/11   Dwana Melena, PA-C  atorvastatin (LIPITOR) 40 MG tablet Take 1 tablet (40 mg total) by mouth daily. 05/12/17 08/10/17  Corwin Levins, MD  cloNIDine (CATAPRES) 0.2 MG tablet Take 1 tablet (0.2 mg total) by mouth 2 (two) times daily. 03/19/17   Jodelle Gross, NP  clopidogrel (PLAVIX) 75 MG tablet Take 1 tablet (75 mg total) by mouth daily. 03/19/17   Jodelle Gross, NP  doxercalciferol (HECTOROL) 4 MCG/2ML injection Inject 2.5 mLs (5 mcg total) into the vein every Monday, Wednesday, and Friday with hemodialysis. 03/06/17   Lonia Blood, MD  febuxostat (ULORIC) 40 MG tablet Take 40 mg by mouth daily as needed (gout).  05/13/11   Corwin Levins, MD  ferric gluconate 125 mg in sodium chloride 0.9 % 100 mL Inject 125 mg into the vein every Monday, Wednesday, and Friday with hemodialysis. 03/06/17   Lonia Blood, MD    ketoconazole (NIZORAL) 2 % cream Apply 1 application topically daily. 12/18/16   Vivi Barrack, DPM  lanthanum ([name]) 1000 MG chewable tablet Chew 2 tablets (2,000 mg total) by mouth 3 (three) times daily with meals. Yearly physical due in July must see MD for refills 09/10/15   Corwin Levins, MD  lidocaine-prilocaine (EMLA) cream Apply 1 application topically as needed. Apply as needed for dialysis site    [provider]  memantine (NAMENDA) 10 MG tablet Take 1 tablet (10 mg total) by mouth 2 (two) times daily. 05/12/17   Corwin Levins, MD  metoprolol tartrate (LOPRESSOR) 50 MG tablet Take 1 tablet (50 mg total) by mouth 2 (two) times daily. 05/12/17   Corwin Levins, MD  nitroGLYCERIN (NITROSTAT) 0.4 MG SL tablet PLACE 1 TABLET UNDER TONGUE AS NEEDED FOR CHEST PAIN EVERY 5 MINUTES (MAX 3 DOSES) 05/12/17   Corwin Levins, MD  terazosin (HYTRIN) 10 MG capsule TAKE 1 CAPSULE BY MOUTH DAILY AT BEDTIME 05/12/17   Corwin Levins, MD  zolpidem (AMBIEN) 10 MG tablet Take 1 tablet (10 mg total) by mouth at bedtime as needed. for sleep 05/12/17   Corwin Levins, MD     Vital Signs: BP (!) 154/92   Pulse 82   Temp (!) 97.4 F (36.3 C) (Oral)   Resp 18   Ht 5\' 11"  (1.803 m)   Wt 191 lb 5.8 oz (86.8 kg)   SpO2 96%   BMI 26.69 kg/m   Physical Exam  Constitutional: He appears well-developed and well-nourished. No distress.  Cardiovascular: Normal rate, regular rhythm  and normal heart sounds.  No murmur heard. Pulmonary/Chest: Effort normal and breath sounds normal. He has no wheezes.  Neurological:  Alert and awake, demonstrates global aphasia. PERRL bilaterally. Demonstrates left gaze preference. Visual fields not assessed. Right facial droop. Tongue midline. No spontaneous movement of right upper extremity, right lower extremity withdraws from pain, left upper and lower extremities demonstrate spontaneous movement. Pronator drift not assessed. Fine motor and coordination not  assessed. Gait not assessed. Romberg not assessed. Heel to toe not assessed. Distal pulses 2+ bilaterally.  Skin: Skin is warm and dry.  Right groin incision soft without active bleeding or hematoma.  Psychiatric: He has a normal mood and affect. His behavior is normal. Judgment and thought content normal.  Nursing note and vitals reviewed.   Imaging: Ct Angio Head W Or Wo Contrast  Result Date: 07/05/2017 CLINICAL DATA:  Right hemiparesis and difficulty speaking. EXAM: CT ANGIOGRAPHY HEAD AND NECK CT PERFUSION BRAIN TECHNIQUE: Multidetector CT imaging of the head and neck was performed using the standard protocol during bolus administration of intravenous contrast. Multiplanar CT image reconstructions and MIPs were obtained to evaluate the vascular anatomy. Carotid stenosis measurements (when applicable) are obtained utilizing NASCET criteria, using the distal internal carotid diameter as the denominator. Multiphase CT imaging of the brain was performed following IV bolus contrast injection. Subsequent parametric perfusion maps were calculated using RAPID software. CONTRAST:  ISOVUE-370 IOPAMIDOL (ISOVUE-370) INJECTION 76% COMPARISON:  Head MRA 12/29/2013.  Neck MRA 06/30/2005 FINDINGS: CTA NECK FINDINGS Aortic arch: Standard 3 vessel aortic arch. Widely patent arch vessel origins. Right carotid system: Patent with predominantly soft plaque in the common carotid artery resulting in less than 50% narrowing. Minimal calcified plaque at the carotid bifurcation. No ICA stenosis. Left carotid system: Patent with predominantly soft plaque in the distal common carotid artery resulting in 50% stenosis. Moderate calcified plaque about the carotid bifurcation. No ICA stenosis. Vertebral arteries: Patent with the left being slightly dominant. Mild proximal right V2 stenosis due to calcified plaque. Calcified and soft plaque in the left V1 segment with mild multifocal stenoses. Skeleton: Mild cervical disc  degeneration. Other neck: Multiple thyroid nodules including a 2.9 cm exophytic. Nodule extending inferiorly from the isthmus. Upper chest: Clear lung apices. Review of the MIP images confirms the above findings CTA HEAD FINDINGS Anterior circulation: The internal carotid arteries are patent from skull base to carotid termini with calcified plaque resulting in mild cavernous and supraclinoid stenosis on the right. The M1 segments are patent with mild stenosis on the right. The left M2 inferior division is occluded at its origin with some reconstitution of more distal M2 and M3 branches, however there are numerous missing distal branches in the left parietal region corresponding to the acute infarct. The prior MRA also suggested a severe stenosis or possibly short segment occlusion of the left M2 inferior division, however motion artifact on that study makes it difficult to make a detailed assessment of interval changes. There are also likely some missing distal left MCA superior division branch vessels. There also severe proximal right M2 stenoses which are at least partially chronic. The right A1 segment is occluded. The left A1 segment is patent but demonstrates a severe proximal stenosis. Both A2 segments are heavily diseased with multifocal severe stenoses and or short segment occlusions. The appearance of the ACAs is grossly similar to the prior motion degraded MRA. No aneurysm. Posterior circulation: The intracranial vertebral arteries are patent to the basilar. There is right greater than left  V4 segment calcified plaque without significant stenosis. Patent SCA origins are identified bilaterally. PICA and AICA origins are not well evaluated. The basilar artery is widely patent. Posterior communicating arteries are diminutive or absent. PCAs are patent with mild left P2 stenosis. No aneurysm. Venous sinuses: Inadequately assessed due to arterial phase contrast timing. Anatomic variants: None. Delayed phase:  Not performed. Review of the MIP images confirms the above findings CT Brain Perfusion Findings: CBF (<30%) Volume: 28mL Perfusion (Tmax>6.0s) volume: , however a substantial amount of this is located in the right cerebral hemisphere Mismatch Volume: though with the caveat that this is artifactually inflated due to (likely chronic) prolonged perfusion in the right cerebral hemisphere. There is still however an apparent sizable penumbra in the posterior left cerebral hemisphere. Infarction Location:Predominantly left parietal lobe with a small amount of posterior left frontal lobe, posterior left temporal lobe, and posterior insula involvement. IMPRESSION: 1. 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 2. Left M2 inferior division occlusion at its origin with some distal reconstitution. Severe stenosis (or possibly short segment occlusion) in this location on a prior MRA. Numerous missing distal branch vessels in the left parietal region corresponding to the acute infarct. 3. Advanced intracranial atherosclerosis with numerous severe stenoses as above. 4. 50% left common carotid artery stenosis. 5.  Aortic Atherosclerosis (ICD10-I70.0). 6. Thyroid nodules measuring up to 2.9 cm. Nonemergent thyroid ultrasound recommended for further evaluation. These results were communicated to Dr. Amada Jupiter at 10:18 pm on 07/05/2017 by text page via the [facility] messaging system. Electronically Signed   By: Sebastian Ache M.D.   On: 07/05/2017 22:47   Ct Head Wo Contrast  Result Date: 07/06/2017 CLINICAL DATA:  Left MCA infarct status post intervention EXAM: CT HEAD WITHOUT CONTRAST TECHNIQUE: Contiguous axial images were obtained from the base of the skull through the vertex without intravenous contrast. COMPARISON:  Cerebral angiogram 07/05/2017 CTA head neck 07/05/2017 FINDINGS: Brain: There is hyperdensity within the left basal ganglia, likely indicating contrast staining. No midline shift or mass  effect. No hydrocephalus. Unchanged right temporal lobe encephalomalacia. Vascular: There is intravascular contrast material related to prior an angiographic study. Skull: Normal visualized skull base, calvarium and extracranial soft tissues. Sinuses/Orbits: No sinus fluid levels or advanced mucosal thickening. No mastoid effusion. Normal orbits. IMPRESSION: Hyperdensity of the left basal ganglia is favored to represent contrast staining over petechial hemorrhage. Electronically Signed   By: Deatra Robinson M.D.   On: 07/06/2017 05:56   Ct Angio Neck W Or Wo Contrast  Result Date: 07/05/2017 CLINICAL DATA:  Right hemiparesis and difficulty speaking. EXAM: CT ANGIOGRAPHY HEAD AND NECK CT PERFUSION BRAIN TECHNIQUE: Multidetector CT imaging of the head and neck was performed using the standard protocol during bolus administration of intravenous contrast. Multiplanar CT image reconstructions and MIPs were obtained to evaluate the vascular anatomy. Carotid stenosis measurements (when applicable) are obtained utilizing NASCET criteria, using the distal internal carotid diameter as the denominator. Multiphase CT imaging of the brain was performed following IV bolus contrast injection. Subsequent parametric perfusion maps were calculated using RAPID software. CONTRAST:  ISOVUE-370 IOPAMIDOL (ISOVUE-370) INJECTION 76% COMPARISON:  Head MRA 12/29/2013.  Neck MRA 06/30/2005 FINDINGS: CTA NECK FINDINGS Aortic arch: Standard 3 vessel aortic arch. Widely patent arch vessel origins. Right carotid system: Patent with predominantly soft plaque in the common carotid artery resulting in less than 50% narrowing. Minimal calcified plaque at the carotid bifurcation. No ICA stenosis. Left carotid system: Patent with predominantly soft plaque in the  distal common carotid artery resulting in 50% stenosis. Moderate calcified plaque about the carotid bifurcation. No ICA stenosis. Vertebral arteries: Patent with the left being slightly  dominant. Mild proximal right V2 stenosis due to calcified plaque. Calcified and soft plaque in the left V1 segment with mild multifocal stenoses. Skeleton: Mild cervical disc degeneration. Other neck: Multiple thyroid nodules including a 2.9 cm exophytic. Nodule extending inferiorly from the isthmus. Upper chest: Clear lung apices. Review of the MIP images confirms the above findings CTA HEAD FINDINGS Anterior circulation: The internal carotid arteries are patent from skull base to carotid termini with calcified plaque resulting in mild cavernous and supraclinoid stenosis on the right. The M1 segments are patent with mild stenosis on the right. The left M2 inferior division is occluded at its origin with some reconstitution of more distal M2 and M3 branches, however there are numerous missing distal branches in the left parietal region corresponding to the acute infarct. The prior MRA also suggested a severe stenosis or possibly short segment occlusion of the left M2 inferior division, however motion artifact on that study makes it difficult to make a detailed assessment of interval changes. There are also likely some missing distal left MCA superior division branch vessels. There also severe proximal right M2 stenoses which are at least partially chronic. The right A1 segment is occluded. The left A1 segment is patent but demonstrates a severe proximal stenosis. Both A2 segments are heavily diseased with multifocal severe stenoses and or short segment occlusions. The appearance of the ACAs is grossly similar to the prior motion degraded MRA. No aneurysm. Posterior circulation: The intracranial vertebral arteries are patent to the basilar. There is right greater than left V4 segment calcified plaque without significant stenosis. Patent SCA origins are identified bilaterally. PICA and AICA origins are not well evaluated. The basilar artery is widely patent. Posterior communicating arteries are diminutive or absent.  PCAs are patent with mild left P2 stenosis. No aneurysm. Venous sinuses: Inadequately assessed due to arterial phase contrast timing. Anatomic variants: None. Delayed phase: Not performed. Review of the MIP images confirms the above findings CT Brain Perfusion Findings: CBF (<30%) Volume: 28mL Perfusion (Tmax>6.0s) volume: , however a substantial amount of this is located in the right cerebral hemisphere Mismatch Volume: though with the caveat that this is artifactually inflated due to (likely chronic) prolonged perfusion in the right cerebral hemisphere. There is still however an apparent sizable penumbra in the posterior left cerebral hemisphere. Infarction Location:Predominantly left parietal lobe with a small amount of posterior left frontal lobe, posterior left temporal lobe, and posterior insula involvement. IMPRESSION: 1. 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 2. Left M2 inferior division occlusion at its origin with some distal reconstitution. Severe stenosis (or possibly short segment occlusion) in this location on a prior MRA. Numerous missing distal branch vessels in the left parietal region corresponding to the acute infarct. 3. Advanced intracranial atherosclerosis with numerous severe stenoses as above. 4. 50% left common carotid artery stenosis. 5.  Aortic Atherosclerosis (ICD10-I70.0). 6. Thyroid nodules measuring up to 2.9 cm. Nonemergent thyroid ultrasound recommended for further evaluation. These results were communicated to Dr. Amada Jupiter at 10:18 pm on 07/05/2017 by text page via the [facility] messaging system. Electronically Signed   By: Sebastian Ache M.D.   On: 07/05/2017 22:47   Ct Cerebral Perfusion W Contrast  Result Date: 07/05/2017 CLINICAL DATA:  Right hemiparesis and difficulty speaking. EXAM: CT ANGIOGRAPHY HEAD AND NECK CT PERFUSION BRAIN TECHNIQUE: Multidetector CT  imaging of the head and neck was performed using the standard protocol during bolus  administration of intravenous contrast. Multiplanar CT image reconstructions and MIPs were obtained to evaluate the vascular anatomy. Carotid stenosis measurements (when applicable) are obtained utilizing NASCET criteria, using the distal internal carotid diameter as the denominator. Multiphase CT imaging of the brain was performed following IV bolus contrast injection. Subsequent parametric perfusion maps were calculated using RAPID software. CONTRAST:  ISOVUE-370 IOPAMIDOL (ISOVUE-370) INJECTION 76% COMPARISON:  Head MRA 12/29/2013.  Neck MRA 06/30/2005 FINDINGS: CTA NECK FINDINGS Aortic arch: Standard 3 vessel aortic arch. Widely patent arch vessel origins. Right carotid system: Patent with predominantly soft plaque in the common carotid artery resulting in less than 50% narrowing. Minimal calcified plaque at the carotid bifurcation. No ICA stenosis. Left carotid system: Patent with predominantly soft plaque in the distal common carotid artery resulting in 50% stenosis. Moderate calcified plaque about the carotid bifurcation. No ICA stenosis. Vertebral arteries: Patent with the left being slightly dominant. Mild proximal right V2 stenosis due to calcified plaque. Calcified and soft plaque in the left V1 segment with mild multifocal stenoses. Skeleton: Mild cervical disc degeneration. Other neck: Multiple thyroid nodules including a 2.9 cm exophytic. Nodule extending inferiorly from the isthmus. Upper chest: Clear lung apices. Review of the MIP images confirms the above findings CTA HEAD FINDINGS Anterior circulation: The internal carotid arteries are patent from skull base to carotid termini with calcified plaque resulting in mild cavernous and supraclinoid stenosis on the right. The M1 segments are patent with mild stenosis on the right. The left M2 inferior division is occluded at its origin with some reconstitution of more distal M2 and M3 branches, however there are numerous missing distal branches in  the left parietal region corresponding to the acute infarct. The prior MRA also suggested a severe stenosis or possibly short segment occlusion of the left M2 inferior division, however motion artifact on that study makes it difficult to make a detailed assessment of interval changes. There are also likely some missing distal left MCA superior division branch vessels. There also severe proximal right M2 stenoses which are at least partially chronic. The right A1 segment is occluded. The left A1 segment is patent but demonstrates a severe proximal stenosis. Both A2 segments are heavily diseased with multifocal severe stenoses and or short segment occlusions. The appearance of the ACAs is grossly similar to the prior motion degraded MRA. No aneurysm. Posterior circulation: The intracranial vertebral arteries are patent to the basilar. There is right greater than left V4 segment calcified plaque without significant stenosis. Patent SCA origins are identified bilaterally. PICA and AICA origins are not well evaluated. The basilar artery is widely patent. Posterior communicating arteries are diminutive or absent. PCAs are patent with mild left P2 stenosis. No aneurysm. Venous sinuses: Inadequately assessed due to arterial phase contrast timing. Anatomic variants: None. Delayed phase: Not performed. Review of the MIP images confirms the above findings CT Brain Perfusion Findings: CBF (<30%) Volume: 28mL Perfusion (Tmax>6.0s) volume: , however a substantial amount of this is located in the right cerebral hemisphere Mismatch Volume: though with the caveat that this is artifactually inflated due to (likely chronic) prolonged perfusion in the right cerebral hemisphere. There is still however an apparent sizable penumbra in the posterior left cerebral hemisphere. Infarction Location:Predominantly left parietal lobe with a small amount of posterior left frontal lobe, posterior left temporal lobe, and posterior insula  involvement. IMPRESSION: 1. Left MCA infarct predominantly involving the parietal  lobe. 2. Left M2 inferior division occlusion at its origin with some distal reconstitution. Severe stenosis (or possibly short segment occlusion) in this location on a prior MRA. Numerous missing distal branch vessels in the left parietal region corresponding to the acute infarct. 3. Advanced intracranial atherosclerosis with numerous severe stenoses as above. 4. 50% left common carotid artery stenosis. 5.  Aortic Atherosclerosis (ICD10-I70.0). 6. Thyroid nodules measuring up to 2.9 cm. Nonemergent thyroid ultrasound recommended for further evaluation. These results were communicated to Dr. Amada Jupiter at 10:18 pm on 07/05/2017 by text page via the [facility] messaging system. Electronically Signed   By: Sebastian Ache M.D.   On: 07/05/2017 22:47   Dg Chest Port 1 View  Result Date: 07/06/2017 CLINICAL DATA:  Cardiomyopathy, coronary artery disease with stent placement, chronic renal insufficiency. EXAM: PORTABLE CHEST 1 VIEW COMPARISON:  Chest x-ray of March 04, 2017 FINDINGS: The lungs are borderline hypoinflated but clear. The heart is top-normal in size. The pulmonary vascularity is normal. There is no pleural effusion. There are post CABG changes. There is a vascular graft in the left axillary region. The bony thorax exhibits no acute abnormality. IMPRESSION: Top-normal cardiac size without pulmonary edema or pleural effusion. No pneumonia. Electronically Signed   By: David  [place] M.D.   On: 07/06/2017 07:57   Ct Head Code Stroke Wo Contrast  Result Date: 07/05/2017 CLINICAL DATA:  Code stroke. Right hemiparesis and difficulty speaking. EXAM: CT HEAD WITHOUT CONTRAST TECHNIQUE: Contiguous axial images were obtained from the base of the skull through the vertex without intravenous contrast. COMPARISON:  Brain MRI 04/27/2016 FINDINGS: Brain: There is loss of gray-white differentiation consistent with acute infarction involving  the posterior left insula and left parietal lobe extending towards the vertex. No acute intracranial hemorrhage, mass, midline shift, or extra-axial fluid collection is identified. Chronic lacunar infarcts are present in the basal ganglia and thalami bilaterally. There is a chronic right temporoparietal infarct. There is a small chronic left cerebellar infarct. Patchy cerebral white matter hypodensities are nonspecific but compatible with chronic small vessel ischemic disease, moderately advanced for age. Mild cerebral atrophy is advanced for age. Vascular: Calcified atherosclerosis at the skull base. No hyperdense vessel. Skull: No fracture or focal osseous lesion. Sinuses/Orbits: Visualized paranasal sinuses and mastoid air cells are clear. Orbits are unremarkable. Other: None. ASPECTS [facility] Stroke Program Early CT Score) - Ganglionic level infarction (caudate, lentiform nuclei, internal capsule, insula, M1-M3 cortex): 5-6 - Supraganglionic infarction (M4-M6 cortex): 2 Total score (0-10 with 10 being normal): 7-8 IMPRESSION: 1. Acute left MCA infarct involving the insula and parietal lobe. No hemorrhage. 2. ASPECTS is 7-8. 3. Age advanced chronic ischemia. These results were communicated to Dr. Amada Jupiter at 9:54 pm on 07/05/2017 by text page via the [facility] messaging system. Electronically Signed   By: Sebastian Ache M.D.   On: 07/05/2017 21:59    Labs:  CBC: Recent Labs    03/05/17 0106 03/06/17 0727 07/05/17 2215 07/05/17 2232 07/06/17 0510  WBC 7.7 6.1 14.8*  --  12.0*  HGB 7.9* 6.9* 13.2 12.6* 11.4*  HCT 24.1* 20.8* 38.9* 37.0* 32.9*  PLT 189 160 191  --  197    COAGS: Recent Labs    03/04/17 2112 03/05/17 0106 03/05/17 0619 07/05/17 2215  INR 1.48  --   --  1.16  APTT 45* 59* 64* 22*    BMP: Recent Labs    03/05/17 0106 03/06/17 0727 07/05/17 2215 07/05/17 2232 07/06/17 0510  NA 133* 134* 136 133* 133*  K 4.2 4.0 4.0 4.8 3.5  CL 93* 96* 94* 98* 101  CO2 25 27 20*  --   18*  GLUCOSE 110* 98 135* 130* 106*  BUN 40* 20 39* 48* 35*  CALCIUM 7.1* 7.9* 10.7*  --  9.6  CREATININE 15.51* 10.06* 14.46* 14.80* 13.49*  GFRNONAA 3* 5* 3*  --  4*  GFRAA 3* 6* 4*  --  4*    LIVER FUNCTION TESTS: Recent Labs    11/04/16 1151 03/05/17 0106 03/06/17 0727 03/19/17 1152 07/05/17 2215  BILITOT 0.6 0.9  --  0.6 1.0  AST 13 39  --  16 15  ALT 11 17  --  11 9*  ALKPHOS 63 44  --  50 40  PROT 7.4 6.2*  --  7.0 7.4  ALBUMIN 4.2 3.2* 3.1* 4.4 3.9    Assessment and Plan:  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 Condition improving- no spontaneous movement of right upper extremity, right lower extremity withdraws from pain, global aphasia noted. Awaiting MRI this AM. Right groin stable. Appreciate and agree with neurology management.  Electronically Signed: Elwin Mocha, PA-C 07/06/2017, 9:45 AM   I spent a total of 15 Minutes at the the patient's bedside AND on the patient's hospital floor or unit, greater than 50% of which was counseling/coordinating care for left MCA inferior division occlusion s/p revascularization.

## 2017-07-06 NOTE — Evaluation (Signed)
Speech Language Pathology Evaluation Patient Details Name: [name] MRN: [id number] DOB: May 05, 1960 Today's Date: 07/06/2017 Time: 4098-1191 SLP Time Calculation (min) (ACUTE ONLY): 21 min  Problem List:  Patient Active Problem List   Diagnosis Date Noted  . 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 Stroke (cerebrum) (HCC) 07/05/2017  . Cardiomyopathy (HCC) 05/12/2017  . Thrombocytopenia (HCC) 03/04/2017  . Elevated PSA 11/04/2016  . OSA (obstructive sleep apnea) 01/08/2016  . Mild cognitive impairment 12/11/2015  . Sleep disorder breathing 10/31/2015  . Urethritis 07/17/2015  . End stage renal disease on dialysis (HCC) 12/06/2014  . Pulse irregularity 09/13/2014  . History of stroke 04/15/2014  . Nail disease 01/22/2014  . CVD (cerebrovascular disease) 12/29/2013  . Acute ischemic left ACA stroke (HCC) 12/29/2013  . Abnormality of gait 12/22/2013  . Exertional angina (HCC) 06/24/2013  . Venereal disease, unspecified 11/10/2012  . ESRD on dialysis (HCC) 07/09/2012  . Left-sided weakness 07/02/2012  . Subclavian artery stenosis (HCC) 03/26/2012  . Left leg weakness 02/04/2012  . Itching 02/04/2012  . Mouth abscess 11/11/2011  . Increased prostate specific antigen (PSA) velocity 11/11/2011  . Right ankle pain 09/10/2011  . S/P coronary artery stent placement, PTCA/DES Resolute to mid-LAD via the LIMA graft, and PTCA of apical 95% stenosis 06/2011 07/04/2011  . History of diastolic dysfunction, grade 2 by echo 07/03/2011  . S/P PTCA (percutaneous transluminal coronary angioplasty), 06/30/11 07/01/2011  . Stable angina: with acute T wave inversion.And unstable angina 06/30/2011  . Impaired glucose tolerance 05/13/2011  . 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 Preventative health care 05/10/2011  . S/P CABG x 4: 04/01/11-(LIMA to LAD, SVG to PL branch of RCA, Sequential SVG to OM1/2) 04/07/2011  . CAD, 3 vessel significant disease at cath this admission. underwent  CABG in Jan 2013 03/28/2011  . Gout 03/27/2011  . Anemia 05/22/2008  . PERIPHERAL VASCULAR DISEASE 05/22/2008  . ACHALASIA 05/22/2008  . GERD 05/22/2008  . Hyperlipidemia 10/25/2006  . Depression 10/25/2006  . Essential hypertension 10/25/2006  . HEMORRHOIDS 10/25/2006  . ALLERGIC RHINITIS 10/25/2006  . DEGENERATIVE JOINT DISEASE, SHOULDER 10/25/2006  . Headache(784.0) 10/25/2006  . CVA (cerebral infarction) 10/25/2006  . RENAL CALCULUS, HX OF 10/25/2006   Past Medical History:  Past Medical History:  Diagnosis Date  . ACHALASIA   . ANEMIA-NOS   . CAD (coronary artery disease), 3 vessel significant disease.    . Cardiomyopathy (HCC) 05/12/2017  . CEREBROVASCULAR ACCIDENT, HX OF   . CHF (congestive heart failure) (HCC)   . CKD (chronic kidney disease) stage 4, GFR 15-29 ml/min (HCC)   . Constipation   . DEGENERATIVE JOINT DISEASE, SHOULDER    knee  . DEPRESSION    2006- not depressed any longer  . Family history of adverse reaction to anesthesia    Mother, hard to awaken  . GERD    no meds required  . Gout    takes Uloric daily  . H/O hiatal hernia   . Headache(784.0)   . Heart attack (HCC)   . History of colon polyps   . History of diastolic dysfunction, grade 2 by echo   . HYPERLIPIDEMIA    takes crestor daily  . Hyperlipidemia   . HYPERTENSION    takes Norvasc,Catapress,Hydralazine,Imdur,and Metoprolol daily  . Impaired glucose tolerance   . Insomnia    takes Ambien nightly  . NSTEMI (non-ST elevated myocardial infarction), 06/30/11 07/01/2011  . RENAL CALCULUS, HX OF   . RENAL INSUFFICIENCY    12/2013-  TThSat  . S/P coronary artery stent placement, PTCA/DES Resolute to mid-LAD via the LIMA graft, and PTCA of apical 95% stenosis 07/05/11 07/04/2011  . S/P PTCA (percutaneous transluminal coronary angioplasty), 06/30/11 07/01/2011  . Seizures (HCC)    INSULIN INDUCED  . Sleep apnea    SLEEP STUDY IN [place] , NO MACHINE YET  1 YEAR  . Stroke (HCC)     93/2005/2006/2007;left sided weakness.  1993 12/2013  . Thyroid disease    Past Surgical History:  Past Surgical History:  Procedure Laterality Date  . ARCH AORTOGRAM N/A 02/19/2012   Procedure: ARCH AORTOGRAM;  Surgeon: Fransisco Hertz, MD;  Location: [facility] CATH LAB;  Service: Cardiovascular;  Laterality: N/A;  . AV FISTULA PLACEMENT  12/02/2011   Procedure: ARTERIOVENOUS (AV) FISTULA CREATION;  Surgeon: Fransisco Hertz, MD;  Location[facility] OR;  Service: Vascular;  Laterality: Left;  Ultrasound Guided  . AV FISTULA PLACEMENT Left 06/08/2012   Procedure: ARTERIOVENOUS (AV) FISTULA CREATION;  Surgeon: Fransisco Hertz, MD;  Location: [facility] OR;  Service: Vascular;  Laterality: Left;  . BASCILIC VEIN TRANSPOSITION Left 09/06/2014   Procedure: FIRST STAGE BASILIC VEIN TRANSPOSITION ;  Surgeon: Fransisco Hertz, MD;  Location: [facility] OR;  Service: Vascular;  Laterality: Left;  . BASCILIC VEIN TRANSPOSITION Left 12/06/2014   Procedure: SECOND STAGE BRACHIAL VEIN TRANSPOSITION ;  Surgeon: Fransisco Hertz, MD;  Location: [facility] OR;  Service: Vascular;  Laterality: Left;  . CARDIAC CATHETERIZATION    . COLONOSCOPY    . CORONARY ANGIOPLASTY     06/30/11   AT [facility]  . CORONARY ARTERY BYPASS GRAFT  04/01/2011   Procedure: CORONARY ARTERY BYPASS GRAFTING (CABG);  Surgeon: Kathlee Nations Suann Larry, MD;  Location: [facility] OR;  Service: Open Heart Surgery;  Laterality: N/A;  . HERNIA REPAIR  2013  . INSERTION OF DIALYSIS CATHETER  04/01/2011   Procedure: INSERTION OF DIALYSIS CATHETER;  Surgeon: Nilda Simmer, MD;  Location: [facility] OR;  Service: Vascular;  Laterality: N/A;  . KNEE SURGERY Right 01/07/2011   arthroscopy  . LEFT HEART CATH AND CORS/GRAFTS ANGIOGRAPHY N/A 03/05/2017   Procedure: LEFT HEART CATH AND CORS/GRAFTS ANGIOGRAPHY;  Surgeon: Runell Gess, MD;  Location: [facility] INVASIVE CV LAB;  Service: Cardiovascular;  Laterality: N/A;  . LEFT HEART CATHETERIZATION WITH CORONARY ANGIOGRAM N/A 03/28/2011   Procedure: LEFT HEART CATHETERIZATION WITH  CORONARY ANGIOGRAM;  Surgeon: Lennette Bihari, MD;  Location: [facility] CATH LAB;  Service: Cardiovascular;  Laterality: N/A;  pending creatnine  . LEFT HEART CATHETERIZATION WITH CORONARY/GRAFT ANGIOGRAM N/A 06/30/2011   Procedure: LEFT HEART CATHETERIZATION WITH Isabel Caprice;  Surgeon: Thurmon Fair, MD;  Location: [facility] CATH LAB;  Service: Cardiovascular;  Laterality: N/A;  . LIGATION OF ARTERIOVENOUS  FISTULA Left 06/08/2012   Procedure: LIGATION OF ARTERIOVENOUS  FISTULA;  Surgeon: Fransisco Hertz, MD;  Location: [facility] OR;  Service: Vascular;  Laterality: Left;  . NM MYOCAR PERF EJECTION FRACTION  06/25/2011   There is evidence of mild ischemia in the basal inferolateral and mid inferolateral regions. (Extent 7 %) The post-stress ejection fraction is 44%. There is mild hypocontractility in the distal inferoapical segment. baseline T wave inversion is noted in leads I, avL, II, V2. This is a low risk scan. The present perfusion study suggests significant myocardial salvage with mild residual ischemia .  Marland Kitchen PERCUTANEOUS CORONARY INTERVENTION-BALLOON ONLY  06/30/2011   Procedure: PERCUTANEOUS CORONARY INTERVENTION-BALLOON ONLY;  Surgeon: Thurmon Fair, MD;  Location: [facility] CATH LAB;  Service: Cardiovascular;;  .  PERCUTANEOUS CORONARY STENT INTERVENTION (PCI-S) N/A 07/04/2011   Procedure: PERCUTANEOUS CORONARY STENT INTERVENTION (PCI-S);  Surgeon: Lennette Bihari, MD;  Location: [facility] CATH LAB;  Service: Cardiovascular;  Laterality: N/A;  . REVISION OF ARTERIOVENOUS GORETEX GRAFT Left 12/06/2014   Procedure: REVISION OF ARTERIOVENOUS GORETEX GRAFT USING 45mmx10cm;  Surgeon: Fransisco Hertz, MD;  Location: [facility] OR;  Service: Vascular;  Laterality: Left;  . STOMACH SURGERY  2009   achalasia  . SUBCLAVIAN STENT PLACEMENT  12/20/2011  . TONSILLECTOMY    . TONSILLECTOMY     HPI:  57 yo code stroke [facility] to [facility] ED with new R weakness, aphasia and neglect. Head CT: Hyperdensity of the left basal ganglia is  favored to represent   Assessment / Plan / Recommendation Clinical Impression  Patient presents with a severe global aphasia impacting both receptive and expressive communication. Ability to follow basic 1-step commands minimally improved in the context of a functional task. Verbal output limited to single automatic words ("Ok" and "yeah") and otherwise unintelligible with significant dysfluency. Patient will benefit from acute SLP f/u to maximize basic communication of needs/wants. Recommend CIR as next venue of care.     SLP Assessment  SLP Recommendation/Assessment: Patient needs continued Speech Lanaguage Pathology Services SLP Visit Diagnosis: Dysphagia, oral phase (R13.11);Aphasia (R47.01)    Follow Up Recommendations  Inpatient Rehab(for language only)    Frequency and Duration min 2x/week  2 weeks      SLP Evaluation Cognition  Arousal/Alertness: Awake/alert Orientation Level: Oriented to person;Oriented to place Attention: Sustained Sustained Attention: Impaired Sustained Attention Impairment: Verbal basic;Functional basic Awareness: Impaired Awareness Impairment: Intellectual impairment Comments: difficulty to fully assess due to level of aphasia       Comprehension  Auditory Comprehension Overall Auditory Comprehension: Impaired Yes/No Questions: Impaired Basic Biographical Questions: 0-25% accurate Commands: Impaired EffectiveTechniques: Other (Comment);Visual/Gestural cues(contextual cues) Visual Recognition/Discrimination Discrimination: Exceptions to [facility] Common Objects: Unable to indentify Reading Comprehension Reading Status: Impaired Word level: Impaired    Expression Expression Primary Mode of Expression: Verbal Verbal Expression Overall Verbal Expression: Impaired Initiation: Impaired Automatic Speech: Social Response("ok", "yeah") Level of Generative/Spontaneous Verbalization: Word Repetition: Impaired Level of Impairment: Word level Naming:  Impairment Responsive: 0-25% accurate Confrontation: Impaired Common Objects: Unable to indentify Convergent: 0-24% accurate Divergent: 0-24% accurate   Oral / Motor  Oral Motor/Sensory Function Overall Oral Motor/Sensory Function: Mild impairment Facial ROM: Within Functional Limits Facial Symmetry: Abnormal symmetry right;Suspected CN VII (facial) dysfunction Facial Strength: Within Functional Limits Facial Sensation: (TBD) Lingual ROM: Within Functional Limits Lingual Symmetry: (unable to assess) Motor Speech Overall Motor Speech: (TBD)   GO            Ferdinand Lango MA, CCC-SLP [phone]          Ferdinand Lango Meryl 07/06/2017, 9:16 AM

## 2017-07-06 NOTE — Anesthesia Postprocedure Evaluation (Signed)
Anesthesia Post Note  Patient: [name]  Procedure(s) Performed: RADIOLOGY WITH ANESTHESIA (N/A )     Patient location during evaluation: NICU Anesthesia Type: General Level of consciousness: awake, lethargic and patient cooperative Pain management: pain level controlled Vital Signs Assessment: post-procedure vital signs reviewed and stable Respiratory status: spontaneous breathing and respiratory function stable Cardiovascular status: blood pressure returned to baseline Anesthetic complications: no    Last Vitals:  Vitals:   07/06/17 0140 07/06/17 0145  BP: 132/87 (!) 111/95  Pulse: 84 90  Resp: 16 (!) 23  Temp: 36.4 C   SpO2: 100% 100%    Last Pain:  Vitals:   07/06/17 0140  TempSrc: Oral                 Derya Dettmann COKER

## 2017-07-06 NOTE — Evaluation (Signed)
Clinical/Bedside Swallow Evaluation Patient Details  Name: [name] MRN: [id number] Date of Birth: 1960/10/22  Today's Date: 07/06/2017 Time: SLP Start Time (ACUTE ONLY): 0843 SLP Stop Time (ACUTE ONLY): 0904 SLP Time Calculation (min) (ACUTE ONLY): 21 min  Past Medical History:  Past Medical History:  Diagnosis Date  . ACHALASIA   . ANEMIA-NOS   . CAD (coronary artery disease), 3 vessel significant disease.    . Cardiomyopathy (HCC) 05/12/2017  . CEREBROVASCULAR ACCIDENT, HX OF   . CHF (congestive heart failure) (HCC)   . CKD (chronic kidney disease) stage 4, GFR 15-29 ml/min (HCC)   . Constipation   . DEGENERATIVE JOINT DISEASE, SHOULDER    knee  . DEPRESSION    2006- not depressed any longer  . Family history of adverse reaction to anesthesia    Mother, hard to awaken  . GERD    no meds required  . Gout    takes Uloric daily  . H/O hiatal hernia   . Headache(784.0)   . Heart attack (HCC)   . History of colon polyps   . History of diastolic dysfunction, grade 2 by echo   . HYPERLIPIDEMIA    takes crestor daily  . Hyperlipidemia   . HYPERTENSION    takes Norvasc,Catapress,Hydralazine,Imdur,and Metoprolol daily  . Impaired glucose tolerance   . Insomnia    takes Ambien nightly  . NSTEMI (non-ST elevated myocardial infarction), 06/30/11 07/01/2011  . RENAL CALCULUS, HX OF   . RENAL INSUFFICIENCY    12/2013- TThSat  . S/P coronary artery stent placement, PTCA/DES Resolute to mid-LAD via the LIMA graft, and PTCA of apical 95% stenosis 07/05/11 07/04/2011  . S/P PTCA (percutaneous transluminal coronary angioplasty), 06/30/11 07/01/2011  . Seizures (HCC)    INSULIN INDUCED  . Sleep apnea    SLEEP STUDY IN [place] , NO MACHINE YET  1 YEAR  . Stroke (HCC)    93/2005/2006/2007;left sided weakness.  1993 12/2013  . Thyroid disease    Past Surgical History:  Past Surgical History:  Procedure Laterality Date  . ARCH AORTOGRAM N/A 02/19/2012   Procedure: ARCH AORTOGRAM;   Surgeon: Fransisco Hertz, MD;  Location: [facility] CATH LAB;  Service: Cardiovascular;  Laterality: N/A;  . AV FISTULA PLACEMENT  12/02/2011   Procedure: ARTERIOVENOUS (AV) FISTULA CREATION;  Surgeon: Fransisco Hertz, MD;  Location: [facility] OR;  Service: Vascular;  Laterality: Left;  Ultrasound Guided  . AV FISTULA PLACEMENT Left 06/08/2012   Procedure: ARTERIOVENOUS (AV) FISTULA CREATION;  Surgeon: Fransisco Hertz, MD;  Location: [facility] OR;  Service: Vascular;  Laterality: Left;  . BASCILIC VEIN TRANSPOSITION Left 09/06/2014   Procedure: FIRST STAGE BASILIC VEIN TRANSPOSITION ;  Surgeon: Fransisco Hertz, MD;  Location[facility] OR;  Service: Vascular;  Laterality: Left;  . BASCILIC VEIN TRANSPOSITION Left 12/06/2014   Procedure: SECOND STAGE BRACHIAL VEIN TRANSPOSITION ;  Surgeon: Fransisco Hertz, MD;  Location: [facility] OR;  Service: Vascular;  Laterality: Left;  . CARDIAC CATHETERIZATION    . COLONOSCOPY    . CORONARY ANGIOPLASTY     06/30/11   AT [facility]  . CORONARY ARTERY BYPASS GRAFT  04/01/2011   Procedure: CORONARY ARTERY BYPASS GRAFTING (CABG);  Surgeon: Kathlee Nations Suann Larry, MD;  Location: [facility] OR;  Service: Open Heart Surgery;  Laterality: N/A;  . HERNIA REPAIR  2013  . INSERTION OF DIALYSIS CATHETER  04/01/2011   Procedure: INSERTION OF DIALYSIS CATHETER;  Surgeon: Nilda Simmer, MD;  Location: [facility] OR;  Service:  Vascular;  Laterality: N/A;  . KNEE SURGERY Right 01/07/2011   arthroscopy  . LEFT HEART CATH AND CORS/GRAFTS ANGIOGRAPHY N/A 03/05/2017   Procedure: LEFT HEART CATH AND CORS/GRAFTS ANGIOGRAPHY;  Surgeon: Runell Gess, MD;  Location: [facility] INVASIVE CV LAB;  Service: Cardiovascular;  Laterality: N/A;  . LEFT HEART CATHETERIZATION WITH CORONARY ANGIOGRAM N/A 03/28/2011   Procedure: LEFT HEART CATHETERIZATION WITH CORONARY ANGIOGRAM;  Surgeon: Lennette Bihari, MD;  Location: [facility] CATH LAB;  Service: Cardiovascular;  Laterality: N/A;  pending creatnine  . LEFT HEART CATHETERIZATION WITH CORONARY/GRAFT ANGIOGRAM N/A 06/30/2011    Procedure: LEFT HEART CATHETERIZATION WITH Isabel Caprice;  Surgeon: Thurmon Fair, MD;  Location: [facility] CATH LAB;  Service: Cardiovascular;  Laterality: N/A;  . LIGATION OF ARTERIOVENOUS  FISTULA Left 06/08/2012   Procedure: LIGATION OF ARTERIOVENOUS  FISTULA;  Surgeon: Fransisco Hertz, MD;  Location: [facility] OR;  Service: Vascular;  Laterality: Left;  . NM MYOCAR PERF EJECTION FRACTION  06/25/2011   There is evidence of mild ischemia in the basal inferolateral and mid inferolateral regions. (Extent 7 %) The post-stress ejection fraction is 44%. There is mild hypocontractility in the distal inferoapical segment. baseline T wave inversion is noted in leads I, avL, II, V2. This is a low risk scan. The present perfusion study suggests significant myocardial salvage with mild residual ischemia .  Marland Kitchen PERCUTANEOUS CORONARY INTERVENTION-BALLOON ONLY  06/30/2011   Procedure: PERCUTANEOUS CORONARY INTERVENTION-BALLOON ONLY;  Surgeon: Thurmon Fair, MD;  Location: [facility] CATH LAB;  Service: Cardiovascular;;  . PERCUTANEOUS CORONARY STENT INTERVENTION (PCI-S) N/A 07/04/2011   Procedure: PERCUTANEOUS CORONARY STENT INTERVENTION (PCI-S);  Surgeon: Lennette Bihari, MD;  Location: [facility] CATH LAB;  Service: Cardiovascular;  Laterality: N/A;  . REVISION OF ARTERIOVENOUS GORETEX GRAFT Left 12/06/2014   Procedure: REVISION OF ARTERIOVENOUS GORETEX GRAFT USING 59mmx10cm;  Surgeon: Fransisco Hertz, MD;  Location: [facility] OR;  Service: Vascular;  Laterality: Left;  . STOMACH SURGERY  2009   achalasia  . SUBCLAVIAN STENT PLACEMENT  12/20/2011  . TONSILLECTOMY    . TONSILLECTOMY     HPI:  57 yo code stroke [facility] ED with new R weakness, aphasia and neglect. Head CT: Hyperdensity of the left basal ganglia is favored to represent   Assessment / Plan / Recommendation Clinical Impression  Patient presents with a functional oropharyngeal swallow characterized by swift oral transit of bolus, consistent initiation of  swallow, and seemingly intact airway protection with clear vocal quality across trials, even when challenged with 3 ounces of thin liquid via consecutive large boluses. Mild anterior labial spillage noted with thin liquids only when provided via cup. Will initiate a po diet and f/u for tolerance given increased risk of aspiration with acute CVA.  SLP Visit Diagnosis: Dysphagia, oral phase (R13.11)    Aspiration Risk  Mild aspiration risk    Diet Recommendation Regular;Thin liquid   Liquid Administration via: Cup;Straw Medication Administration: Whole meds with liquid Supervision: Patient able to self feed;Full supervision/cueing for compensatory strategies Compensations: Slow rate;Small sips/bites Postural Changes: Seated upright at 90 degrees    Other  Recommendations Oral Care Recommendations: Oral care BID   Follow up Recommendations Inpatient Rehab(for language only)      Frequency and Duration min 1 x/week  1 week           Swallow Study   General HPI: 57 yo code stroke from [facility] to [facility] ED with new R weakness, aphasia and neglect. Head CT:  Hyperdensity of the left basal ganglia is favored to represent Type of Study: Bedside Swallow Evaluation Previous Swallow Assessment: none noted Diet Prior to this Study: NPO Temperature Spikes Noted: No Respiratory Status: Room air History of Recent Intubation: Yes Length of Intubations (days): (for procedure only) Behavior/Cognition: Alert;Cooperative;Pleasant mood(global aphasia) Oral Cavity Assessment: Within Functional Limits Oral Care Completed by SLP: Yes Oral Cavity - Dentition: Adequate natural dentition Vision: Functional for self-feeding Self-Feeding Abilities: Able to feed self Patient Positioning: Upright in bed Baseline Vocal Quality: Normal Volitional Cough: Cognitively unable to elicit Volitional Swallow: Unable to elicit    Oral/Motor/Sensory Function Overall Oral Motor/Sensory Function: Mild  impairment Facial ROM: Within Functional Limits Facial Symmetry: Abnormal symmetry right;Suspected CN VII (facial) dysfunction Facial Strength: Within Functional Limits Facial Sensation: (TBD) Lingual ROM: Within Functional Limits Lingual Symmetry: (unable to assess)   Ice Chips Ice chips: Within functional limits Presentation: Spoon   Thin Liquid Thin Liquid: Impaired Presentation: Cup;Self Fed;Straw Oral Phase Impairments: Reduced labial seal Oral Phase Functional Implications: Right anterior spillage    Nectar Thick Nectar Thick Liquid: Not tested   Honey Thick Honey Thick Liquid: Not tested   Puree Puree: Within functional limits Presentation: Spoon   Solid   Gregory Villalon MA, CCC-SLP [phone]    Solid: Within functional limits Presentation: Self Fed        [name] 07/06/2017,9:10 AM

## 2017-07-07 ENCOUNTER — Encounter (HOSPITAL_COMMUNITY): Payer: Self-pay | Admitting: Interventional Radiology

## 2017-07-07 DIAGNOSIS — I679 Cerebrovascular disease, unspecified: Secondary | ICD-10-CM

## 2017-07-07 DIAGNOSIS — I959 Hypotension, unspecified: Secondary | ICD-10-CM

## 2017-07-07 LAB — RENAL FUNCTION PANEL
ALBUMIN: 3.3 g/dL — AB (ref 3.5–5.0)
Anion gap: 14 (ref 5–15)
BUN: 26 mg/dL — AB (ref 6–20)
CO2: 21 mmol/L — ABNORMAL LOW (ref 22–32)
CREATININE: 10.4 mg/dL — AB (ref 0.61–1.24)
Calcium: 9.2 mg/dL (ref 8.9–10.3)
Chloride: 100 mmol/L — ABNORMAL LOW (ref 101–111)
GFR, EST AFRICAN AMERICAN: 6 mL/min — AB (ref >60)
GFR, EST NON AFRICAN AMERICAN: 5 mL/min — AB (ref >60)
Glucose, Bld: 89 mg/dL (ref 65–99)
PHOSPHORUS: 4.5 mg/dL (ref 2.5–4.6)
POTASSIUM: 3.4 mmol/L — AB (ref 3.5–5.1)
Sodium: 135 mmol/L (ref 135–145)

## 2017-07-07 LAB — RAPID URINE DRUG SCREEN, HOSP PERFORMED
Amphetamines: NOT DETECTED
BENZODIAZEPINES: NOT DETECTED
Barbiturates: NOT DETECTED
COCAINE: NOT DETECTED
Opiates: NOT DETECTED
Tetrahydrocannabinol: NOT DETECTED

## 2017-07-07 LAB — CBC
HCT: 34.9 % — ABNORMAL LOW (ref 39.0–52.0)
Hemoglobin: 11.7 g/dL — ABNORMAL LOW (ref 13.0–17.0)
MCH: 30.5 pg (ref 26.0–34.0)
MCHC: 33.5 g/dL (ref 30.0–36.0)
MCV: 90.9 fL (ref 78.0–100.0)
PLATELETS: 187 10*3/uL (ref 150–400)
RBC: 3.84 MIL/uL — AB (ref 4.22–5.81)
RDW: 14.5 % (ref 11.5–15.5)
WBC: 7.5 10*3/uL (ref 4.0–10.5)

## 2017-07-07 MED ORDER — SODIUM CHLORIDE 0.9 % IV SOLN
100.0000 mL | INTRAVENOUS | Status: DC | PRN
Start: 2017-07-07 — End: 2017-07-11

## 2017-07-07 MED ORDER — ALTEPLASE 2 MG IJ SOLR: 2.0000 mg | Freq: Once | INTRAMUSCULAR | Status: DC | PRN

## 2017-07-07 MED ORDER — DOXERCALCIFEROL 4 MCG/2ML IV SOLN
2.0000 ug | Freq: Once | INTRAVENOUS | Status: AC
  Filled 2017-07-07: qty 2

## 2017-07-07 MED ORDER — LIDOCAINE HCL (PF) 1 % IJ SOLN: 5.0000 mL | INTRAMUSCULAR | Status: DC | PRN

## 2017-07-07 MED ORDER — PENTAFLUOROPROP-TETRAFLUOROETH EX AERO: 1.0000 "application " | INHALATION_SPRAY | CUTANEOUS | Status: DC | PRN

## 2017-07-07 MED ORDER — LIDOCAINE-PRILOCAINE 2.5-2.5 % EX CREA
1.0000 "application " | TOPICAL_CREAM | CUTANEOUS | Status: DC | PRN
  Filled 2017-07-07: qty 5

## 2017-07-07 MED ORDER — NEPRO/CARBSTEADY PO LIQD
237.0000 mL | Freq: Two times a day (BID) | ORAL | Status: DC
  Administered 2017-07-08 – 2017-07-17 (×15): 237 mL via ORAL
  Filled 2017-07-07 (×23): qty 237

## 2017-07-07 MED ORDER — HEPARIN SODIUM (PORCINE) 1000 UNIT/ML DIALYSIS: 1000.0000 [IU] | INTRAMUSCULAR | Status: DC | PRN

## 2017-07-07 NOTE — Progress Notes (Signed)
Assessment/Plan: 1.  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 Corliss Skains. CTA showed diffuse intracranial stenosis. Post intervention MRI and MRA completed today. Per neuro/IR. 2.  ESRD -  HD todaty with some labile BP will reduce UF next HD.  Will plan next HD off sched on Thursd. 3. CHF w/cardiomyopathy -   Subjective: Interval History: Had HD today  Objective: Vital signs in last 24 hours: Temp:  [97.5 F (36.4 C)-98.4 F (36.9 C)] 97.7 F (36.5 C) (04/23 1138) Pulse Rate:  [29-149] 65 (04/23 1500) Resp:  [12-25] 12 (04/23 1600) BP: (83-150)/(55-114) 105/55 (04/23 1600) SpO2:  [80 %-100 %] 100 % (04/23 1500) Weight:  [89.3 kg (196 lb 13.9 oz)] 89.3 kg (196 lb 13.9 oz) (04/23 0700) Weight change: 2.5 kg (5 lb 8.2 oz)  Intake/Output from previous day: 04/22 0701 - 04/23 0700 In: 936 [I.V.:936] Out: -  Intake/Output this shift: Total I/O In: 270 [I.V.:270] Out: 2334 [Urine:420; Other:1914]  General appearance: alert and aphasic  Being fed by sister some inattentiveness Abd soft. Moves LUE ok and understands  Lab Results: Recent Labs    07/06/17 0510 07/07/17 0806  WBC 12.0* 7.5  HGB 11.4* 11.7*  HCT 32.9* 34.9*  PLT 197 187   BMET:  Recent Labs    07/06/17 0510 07/07/17 0804  NA 133* 135  K 3.5 3.4*  CL 101 100*  CO2 18* 21*  GLUCOSE 106* 89  BUN 35* 26*  CREATININE 13.49* 10.40*  CALCIUM 9.6 9.2   No results for input(s): PTH in the last 72 hours. Iron Studies: No results for input(s): IRON, TIBC, TRANSFERRIN, FERRITIN in the last 72 hours. Studies/Results: Ct Angio Head W Or Wo Contrast  Result Date: 07/05/2017 CLINICAL DATA:  Right hemiparesis and difficulty speaking. EXAM: CT ANGIOGRAPHY HEAD AND NECK CT PERFUSION BRAIN TECHNIQUE: Multidetector CT imaging of the head and neck was performed using the standard protocol during bolus administration of intravenous  contrast. Multiplanar CT image reconstructions and MIPs were obtained to evaluate the vascular anatomy. Carotid stenosis measurements (when applicable) are obtained utilizing NASCET criteria, using the distal internal carotid diameter as the denominator. Multiphase CT imaging of the brain was performed following IV bolus contrast injection. Subsequent parametric perfusion maps were calculated using RAPID software. CONTRAST:  ISOVUE-370 IOPAMIDOL (ISOVUE-370) INJECTION 76% COMPARISON:  Head MRA 12/29/2013.  Neck MRA 06/30/2005 FINDINGS: CTA NECK FINDINGS Aortic arch: Standard 3 vessel aortic arch. Widely patent arch vessel origins. Right carotid system: Patent with predominantly soft plaque in the common carotid artery resulting in less than 50% narrowing. Minimal calcified plaque at the carotid bifurcation. No ICA stenosis. Left carotid system: Patent with predominantly soft plaque in the distal common carotid artery resulting in 50% stenosis. Moderate calcified plaque about the carotid bifurcation. No ICA stenosis. Vertebral arteries: Patent with the left being slightly dominant. Mild proximal right V2 stenosis due to calcified plaque. Calcified and soft plaque in the left V1 segment with mild multifocal stenoses. Skeleton: Mild cervical disc degeneration. Other neck: Multiple thyroid nodules including a 2.9 cm exophytic. Nodule extending inferiorly from the isthmus. Upper chest: Clear lung apices. Review of the MIP images confirms the above findings CTA HEAD FINDINGS Anterior circulation: The internal carotid arteries are patent from skull base to carotid termini with calcified plaque resulting in mild cavernous and supraclinoid stenosis on the right. The M1 segments are patent with mild stenosis on the right. The  left M2 inferior division is occluded at its origin with some reconstitution of more distal M2 and M3 branches, however there are numerous missing distal branches in the left parietal region  corresponding to the acute infarct. The prior MRA also suggested a severe stenosis or possibly short segment occlusion of the left M2 inferior division, however motion artifact on that study makes it difficult to make a detailed assessment of interval changes. There are also likely some missing distal left MCA superior division branch vessels. There also severe proximal right M2 stenoses which are at least partially chronic. The right A1 segment is occluded. The left A1 segment is patent but demonstrates a severe proximal stenosis. Both A2 segments are heavily diseased with multifocal severe stenoses and or short segment occlusions. The appearance of the ACAs is grossly similar to the prior motion degraded MRA. No aneurysm. Posterior circulation: The intracranial vertebral arteries are patent to the basilar. There is right greater than left V4 segment calcified plaque without significant stenosis. Patent SCA origins are identified bilaterally. PICA and AICA origins are not well evaluated. The basilar artery is widely patent. Posterior communicating arteries are diminutive or absent. PCAs are patent with mild left P2 stenosis. No aneurysm. Venous sinuses: Inadequately assessed due to arterial phase contrast timing. Anatomic variants: None. Delayed phase: Not performed. Review of the MIP images confirms the above findings CT Brain Perfusion Findings: CBF (<30%) Volume: 28mL Perfusion (Tmax>6.0s) volume: , however a substantial amount of this is located in the right cerebral hemisphere Mismatch Volume: though with the caveat that this is artifactually inflated due to (likely chronic) prolonged perfusion in the right cerebral hemisphere. There is still however an apparent sizable penumbra in the posterior left cerebral hemisphere. Infarction Location:Predominantly left parietal lobe with a small amount of posterior left frontal lobe, posterior left temporal lobe, and posterior insula involvement. IMPRESSION:  1. 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 2. Left M2 inferior division occlusion at its origin with some distal reconstitution. Severe stenosis (or possibly short segment occlusion) in this location on a prior MRA. Numerous missing distal branch vessels in the left parietal region corresponding to the acute infarct. 3. Advanced intracranial atherosclerosis with numerous severe stenoses as above. 4. 50% left common carotid artery stenosis. 5.  Aortic Atherosclerosis (ICD10-I70.0). 6. Thyroid nodules measuring up to 2.9 cm. Nonemergent thyroid ultrasound recommended for further evaluation. These results were communicated to Dr. Amada Jupiter at 10:18 pm on 07/05/2017 by text page via the [facility] messaging system. Electronically Signed   By: Sebastian Ache M.D.   On: 07/05/2017 22:47   Ct Head Wo Contrast  Result Date: 07/06/2017 CLINICAL DATA:  Stroke follow-up EXAM: CT HEAD WITHOUT CONTRAST TECHNIQUE: Contiguous axial images were obtained from the base of the skull through the vertex without intravenous contrast. COMPARISON:  Brain MRI 07/06/2017 Head CT 07/06/2017 at 5:35 a.m. FINDINGS: Brain: Decreased density of material of the left basal ganglia, consistent with clearing of contrast staining. Right temporal encephalomalacia is unchanged. No acute hemorrhage or mass effect. No hydrocephalus. Vascular: Small amount of residual contrast material within the vessels. Skull: Normal visualized skull base, calvarium and extracranial soft tissues. Sinuses/Orbits: No sinus fluid levels or advanced mucosal thickening. No mastoid effusion. Normal orbits. IMPRESSION: Decreased density of hyperdense material at the left basal ganglia, consistent with clearing of contrast staining. No new abnormality. Electronically Signed   By: Deatra Robinson M.D.   On: 07/06/2017 23:24   Ct Head Wo Contrast  Result Date: 07/06/2017  CLINICAL DATA:  Left MCA infarct status post intervention EXAM: CT HEAD WITHOUT CONTRAST  TECHNIQUE: Contiguous axial images were obtained from the base of the skull through the vertex without intravenous contrast. COMPARISON:  Cerebral angiogram 07/05/2017 CTA head neck 07/05/2017 FINDINGS: Brain: There is hyperdensity within the left basal ganglia, likely indicating contrast staining. No midline shift or mass effect. No hydrocephalus. Unchanged right temporal lobe encephalomalacia. Vascular: There is intravascular contrast material related to prior an angiographic study. Skull: Normal visualized skull base, calvarium and extracranial soft tissues. Sinuses/Orbits: No sinus fluid levels or advanced mucosal thickening. No mastoid effusion. Normal orbits. IMPRESSION: Hyperdensity of the left basal ganglia is favored to represent contrast staining over petechial hemorrhage. Electronically Signed   By: Deatra Robinson M.D.   On: 07/06/2017 05:56   Ct Angio Neck W Or Wo Contrast  Result Date: 07/05/2017 CLINICAL DATA:  Right hemiparesis and difficulty speaking. EXAM: CT ANGIOGRAPHY HEAD AND NECK CT PERFUSION BRAIN TECHNIQUE: Multidetector CT imaging of the head and neck was performed using the standard protocol during bolus administration of intravenous contrast. Multiplanar CT image reconstructions and MIPs were obtained to evaluate the vascular anatomy. Carotid stenosis measurements (when applicable) are obtained utilizing NASCET criteria, using the distal internal carotid diameter as the denominator. Multiphase CT imaging of the brain was performed following IV bolus contrast injection. Subsequent parametric perfusion maps were calculated using RAPID software. CONTRAST:  ISOVUE-370 IOPAMIDOL (ISOVUE-370) INJECTION 76% COMPARISON:  Head MRA 12/29/2013.  Neck MRA 06/30/2005 FINDINGS: CTA NECK FINDINGS Aortic arch: Standard 3 vessel aortic arch. Widely patent arch vessel origins. Right carotid system: Patent with predominantly soft plaque in the common carotid artery resulting in less than 50%  narrowing. Minimal calcified plaque at the carotid bifurcation. No ICA stenosis. Left carotid system: Patent with predominantly soft plaque in the distal common carotid artery resulting in 50% stenosis. Moderate calcified plaque about the carotid bifurcation. No ICA stenosis. Vertebral arteries: Patent with the left being slightly dominant. Mild proximal right V2 stenosis due to calcified plaque. Calcified and soft plaque in the left V1 segment with mild multifocal stenoses. Skeleton: Mild cervical disc degeneration. Other neck: Multiple thyroid nodules including a 2.9 cm exophytic. Nodule extending inferiorly from the isthmus. Upper chest: Clear lung apices. Review of the MIP images confirms the above findings CTA HEAD FINDINGS Anterior circulation: The internal carotid arteries are patent from skull base to carotid termini with calcified plaque resulting in mild cavernous and supraclinoid stenosis on the right. The M1 segments are patent with mild stenosis on the right. The left M2 inferior division is occluded at its origin with some reconstitution of more distal M2 and M3 branches, however there are numerous missing distal branches in the left parietal region corresponding to the acute infarct. The prior MRA also suggested a severe stenosis or possibly short segment occlusion of the left M2 inferior division, however motion artifact on that study makes it difficult to make a detailed assessment of interval changes. There are also likely some missing distal left MCA superior division branch vessels. There also severe proximal right M2 stenoses which are at least partially chronic. The right A1 segment is occluded. The left A1 segment is patent but demonstrates a severe proximal stenosis. Both A2 segments are heavily diseased with multifocal severe stenoses and or short segment occlusions. The appearance of the ACAs is grossly similar to the prior motion degraded MRA. No aneurysm. Posterior circulation: The  intracranial vertebral arteries are patent to the basilar. There is  right greater than left V4 segment calcified plaque without significant stenosis. Patent SCA origins are identified bilaterally. PICA and AICA origins are not well evaluated. The basilar artery is widely patent. Posterior communicating arteries are diminutive or absent. PCAs are patent with mild left P2 stenosis. No aneurysm. Venous sinuses: Inadequately assessed due to arterial phase contrast timing. Anatomic variants: None. Delayed phase: Not performed. Review of the MIP images confirms the above findings CT Brain Perfusion Findings: CBF (<30%) Volume: 28mL Perfusion (Tmax>6.0s) volume: , however a substantial amount of this is located in the right cerebral hemisphere Mismatch Volume: though with the caveat that this is artifactually inflated due to (likely chronic) prolonged perfusion in the right cerebral hemisphere. There is still however an apparent sizable penumbra in the posterior left cerebral hemisphere. Infarction Location:Predominantly left parietal lobe with a small amount of posterior left frontal lobe, posterior left temporal lobe, and posterior insula involvement. IMPRESSION: 1. 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 2. Left M2 inferior division occlusion at its origin with some distal reconstitution. Severe stenosis (or possibly short segment occlusion) in this location on a prior MRA. Numerous missing distal branch vessels in the left parietal region corresponding to the acute infarct. 3. Advanced intracranial atherosclerosis with numerous severe stenoses as above. 4. 50% left common carotid artery stenosis. 5.  Aortic Atherosclerosis (ICD10-I70.0). 6. Thyroid nodules measuring up to 2.9 cm. Nonemergent thyroid ultrasound recommended for further evaluation. These results were communicated to Dr. Amada Jupiter at 10:18 pm on 07/05/2017 by text page via the [facility] messaging system. Electronically Signed    By: Sebastian Ache M.D.   On: 07/05/2017 22:47   Mr Maxine Glenn Head Wo Contrast  Result Date: 07/06/2017 CLINICAL DATA:  Stroke follow-up EXAM: MRI HEAD WITHOUT CONTRAST MRA HEAD WITHOUT CONTRAST TECHNIQUE: Multiplanar, multiecho pulse sequences of the brain and surrounding structures were obtained without intravenous contrast. Angiographic images of the head were obtained using MRA technique without contrast. COMPARISON:  CT, CTA, and CTP 07/05/2017.  Brain MRI 04/27/2016 FINDINGS: MRI HEAD FINDINGS Brain: Cortically based restricted diffusion in the posterior left insula, superior temporal lobe, parietal lobe, and minimally involving the posterior left frontal lobe. There is good correlation with infarct by CT perfusion. Small remote infarcts in the left superior cerebellum, left paramedian pons, left basal ganglia/internal capsule, and right temporal parietal cortex. There is chronic blood products at the remote left basal ganglia infarct. No hemorrhage seen at the acute infarct. No hydrocephalus or masslike finding. Vascular: Arterial findings below. Normal dural venous sinus flow voids. Skull and upper cervical spine: Negative Sinuses/Orbits: Negative MRA HEAD FINDINGS Symmetric carotid and vertebral arteries. The vertebral and basilar arteries are smooth and diffusely patent. Atheromatous irregularity of bilateral posterior cerebral arteries with high-grade left P2 segment narrowing. Essentially nonvisualized bilateral ACA attributed to advanced atheromatous narrowing based on prior CTA. Less robust flow signal seen in the left MCA with continued nonvisualization of the inferior division. The left superior division and right MCA vasculature show diffuse atheromatous irregularity. IMPRESSION: 1. Acute left MCA territory cortical infarct without detected progression when compared to CTP from yesterday. No hemorrhagic conversion. 2. Continued non visualization of the left MCA inferior division. 3. There is advanced  intracranial atherosclerosis with nonvisualized ACAs and severe left P2 segment stenosis. 4. Remote small vessel infarcts as described. Electronically Signed   By: Marnee Spring M.D.   On: 07/06/2017 12:59   Mr Brain Wo Contrast  Result Date: 07/06/2017 CLINICAL DATA:  Stroke follow-up EXAM: MRI  HEAD WITHOUT CONTRAST MRA HEAD WITHOUT CONTRAST TECHNIQUE: Multiplanar, multiecho pulse sequences of the brain and surrounding structures were obtained without intravenous contrast. Angiographic images of the head were obtained using MRA technique without contrast. COMPARISON:  CT, CTA, and CTP 07/05/2017.  Brain MRI 04/27/2016 FINDINGS: MRI HEAD FINDINGS Brain: Cortically based restricted diffusion in the posterior left insula, superior temporal lobe, parietal lobe, and minimally involving the posterior left frontal lobe. There is good correlation with infarct by CT perfusion. Small remote infarcts in the left superior cerebellum, left paramedian pons, left basal ganglia/internal capsule, and right temporal parietal cortex. There is chronic blood products at the remote left basal ganglia infarct. No hemorrhage seen at the acute infarct. No hydrocephalus or masslike finding. Vascular: Arterial findings below. Normal dural venous sinus flow voids. Skull and upper cervical spine: Negative Sinuses/Orbits: Negative MRA HEAD FINDINGS Symmetric carotid and vertebral arteries. The vertebral and basilar arteries are smooth and diffusely patent. Atheromatous irregularity of bilateral posterior cerebral arteries with high-grade left P2 segment narrowing. Essentially nonvisualized bilateral ACA attributed to advanced atheromatous narrowing based on prior CTA. Less robust flow signal seen in the left MCA with continued nonvisualization of the inferior division. The left superior division and right MCA vasculature show diffuse atheromatous irregularity. IMPRESSION: 1. Acute left MCA territory cortical infarct without detected  progression when compared to CTP from yesterday. No hemorrhagic conversion. 2. Continued non visualization of the left MCA inferior division. 3. There is advanced intracranial atherosclerosis with nonvisualized ACAs and severe left P2 segment stenosis. 4. Remote small vessel infarcts as described. Electronically Signed   By: Marnee Spring M.D.   On: 07/06/2017 12:59   Ir Ct Head Ltd  Result Date: 07/07/2017 INDICATION: Flaccid right and arm leg. Left gaze deviation. Dysarthria. Occluded inferior division of the left middle cerebral artery. EXAM: 1. EMERGENT LARGE VESSEL OCCLUSION THROMBOLYSIS (anterior CIRCULATION) COMPARISON:  CT angiogram of the head and neck of 07/05/2017. MEDICATIONS: Ancef 2 g IV antibiotic was administered within 1 hour of the procedure. ANESTHESIA/SEDATION: General anesthesia. CONTRAST:  Isovue 300 approximately 70 cc. FLUOROSCOPY TIME:  Fluoroscopy Time: 27 minutes 12 seconds (2951 mGy). COMPLICATIONS: None immediate. TECHNIQUE: Following a full explanation of the procedure along with the potential associated complications, an informed witnessed consent was obtained. The risks of intracranial hemorrhage of 10%, worsening neurological deficit, ventilator dependency, death and inability to revascularize were all reviewed in detail with the patient's family. The patient was then put under general anesthesia by the Department of Anesthesiology at [facility]. The right groin was prepped and draped in the usual sterile fashion. Thereafter using modified Seldinger technique, transfemoral access into the right common femoral artery was obtained without difficulty. Over a 0.035 inch guidewire a 5 French Pinnacle sheath was inserted. Through this, and also over a 0.035 inch guidewire a 5 [place] JB 1 catheter was advanced to the aortic arch region and selectively positioned in the right common carotid artery, the right vertebral artery, the left vertebral artery and the left common  carotid artery. FINDINGS: The left common carotid arteriogram demonstrates approximately 50% stenosis of the mid segment of the left common carotid artery. The left external carotid artery and its major branches are widely patent. The left internal carotid artery at the bulb demonstrates wide patency. More distally, there is mild decreased caliber of the left internal carotid artery in its entirety. The petrous, cavernous and supraclinoid segments demonstrate wide patency. The left anterior cerebral artery demonstrates complete occlusion distal to the left  A1 segment. This is associated with caliber irregularity in the A1 segment. The left middle cerebral artery M1 segment proximally demonstrates mild caliber irregularity. There is patency of the superior division of the left middle cerebral artery with delayed reconstitution of the distal inferior division of the left middle cerebral artery in the M2 M3 regions. Distal to this the M3 branches and the M4 branches demonstrated gradual ascent of contrast. The inferior division appears to be completely occluded associated with a stump. A diminutive anterior temporal branch is noted. A prominent anterior choroidal artery on the left is noted with delayed arterial blush noted in the region of the splenium of the corpus callosum. The whole head delayed arterial phase in the lateral projection demonstrates a prominent area of hypoperfusion involving the left parietal cortical and subcortical regions. The left vertebral artery origin is from the thyrocervical trunk. The vessel is seen to opacify to the cranial skull base. Wide patency is seen of the left vertebrobasilar junction and the left posterior inferior cerebellar artery. The basilar artery, the posterior cerebral arteries, the superior cerebellar arteries and the anterior-inferior cerebellar arteries opacify into the capillary and venous phases. Prominent collaterals are seen in the P3 region of the left posterior  cerebral artery retrogradely filling the posterior 1/3 of the left parietal cortical and subcortical areas. The delayed arterial phase then demonstrates retrograde opacification of the distal pericallosal artery in the splenium of the corpus callosum from the lateral thalamo perforator branches. Also demonstrated is retrograde opacification of the posterior half of the anterior temporal region from the posterior temporal branches of the P1 P2 region of the left posterior cerebral artery. The right common carotid arteriogram demonstrates mild narrowing in the mid third of the right common carotid artery. The right external carotid artery and its major branches are widely patent. The right internal carotid artery at the bulb to the cranial skull base is seen to opacify unimpeded. The petrous segment is normal. There is mild narrowing of the caval cavernous segment of the right internal carotid artery. Distal to this the supraclinoid segment demonstrates mild decrease in caliber. The right middle cerebral artery demonstrates a 50% stenosis in the distal right M1 segment. The proximal superior and the inferior divisions demonstrate moderate stenosis. Again demonstrated is a prominent anterior choroidal artery on the right side. The delayed arterial phase demonstrates hypoperfusion of the parietal cortical and subcortical regions. The right vertebral artery origin is from the right thyrocervical trunk. Again this vessel is seen to opacify to the cranial skull base. Wide patency is seen of the right vertebrobasilar junction and the right posterior-inferior cerebellar artery. Multiple focal areas of caliber irregularity are seen involving the posterior-inferior cerebellar artery. The opacified portion of the basilar artery, the proximal posterior cerebral arteries, the superior cerebellar arteries and the anterior-inferior cerebellar arteries are seen to opacify into the capillary and venous phases. Extensive collaterals  are again seen from the P3 regions of both posterior cerebral arteries contributing to the posterior parietal middle cerebral artery distributions bilaterally as described earlier. PROCEDURE: ENDOVASCULAR TREATMENT OF OCCLUDED DOMINANT INFERIOR DIVISION OF THE LEFT MIDDLE CEREBRAL ARTERY The diagnostic JB 1 catheter in the left common carotid artery was exchanged over a 0.035 inch 8 French 55 cm Brite tip neurovascular sheath using biplane roadmap technique and constant fluoroscopic guidance. Good aspiration obtained from the hub of the 8 [place] Brite tip neurovascular sheath. A gentle contrast injection demonstrated no evidence of spasms, dissections or of intraluminal filling defects. This was  then connected to continuous heparinized saline infusion. Over the [facility] exchange guidewire, an 85 cm 8 [place] of balloon FlowGate guide catheter which had been prepped with 50% contrast and 50% heparinized saline infusion was advanced and positioned just proximal to the left common carotid bifurcation. The guidewire was removed. Good aspiration was obtained at the hub of the [facility] guide catheter. Over a 0.035 inch Roadrunner guidewire, using biplane roadmap technique and constant fluoroscopic guidance, the FlowGate guide catheter was then advanced to the mid cervical left ICA. The guidewire was removed. Again good aspiration was obtained from the hub of the 8 [place] FlowGate guide catheter. A gentle control arteriogram performed centered extracranially and intracranially demonstrated no change in the intracranial circulation. Over a 0.014 inch Softip Synchro micro guidewire, a Trevo ProVue 021 microcatheter was advanced to the distal end of the of FlowGate guide catheter. With the micro guidewire leading with a J-tip configuration, the combination was navigated to the supraclinoid left ICA. The micro guidewire was then advanced gently into the inferior division origin followed by the microcatheter. A gentle manipulation  of the micro guidewire was then performed using a torque device to encourage advancement through the occluded left middle cerebral artery. The microcatheter was then brought into close proximity to the site of the occlusion. Further manipulation was performed with partial advancement of the micro guidewire through the occluded left middle cerebral artery inferior division. To avoid potential perforation of the more distal inferior division the micro guidewire was retrieved. Good aspiration obtained from the hub of the microcatheter. Approximately 3.6 mg of super selective intracranial intra-arterial Integrilin was infused over a couple of minutes. Thereafter, further attempts were made to encourage advancement of a very soft microguidewire again with only partial success. 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 The procedure was then stopped. The final control arteriogram performed through the [facility] guide catheter in the left internal carotid artery continued to demonstrate no significant change in the intracranial circulation. No angiographic evidence of extravasation or mass-effect or midline shift was seen. A Dyna CT of the head performed revealed no evidence of hemorrhage or change in the ventricular size or mass effect or midline shift. The [facility] guide catheter and the neurovascular sheath were then exchanged over a J-tip 0.035 guidewire for an 8 [place] Pinnacle sheath. This was then removed with successful hemostasis being achieved by manual compression. At the end of the procedure, the patient's right groin appeared soft without evidence of bleeding or hematoma. Distal pulses continued to be palpable in the dorsalis pedis, and the posterior tibial regions unchanged from prior to the procedure. The patient's general anesthesia was then reversed. The patient was extubated without difficulty. Upon recovery the patient was noted to be able to raise his right arm, and also bend his right  knee. He responded appropriately to simple commands. He was then transferred to the PACU and then to neuro ICU for further management. IMPRESSION: Status post attempted revascularization of occluded dominant inferior division of the right middle cerebral artery using 3.6 mg of super selective intracranial intra-arterial Integrilin and mechanical thrombolysis with a micro guidewire. PLAN: Further management as per stroke team. Electronically Signed   By: Julieanne Cotton M.D.   On: 07/06/2017 10:05   Ct Cerebral Perfusion W Contrast  Result Date: 07/05/2017 CLINICAL DATA:  Right hemiparesis and difficulty speaking. EXAM: CT ANGIOGRAPHY HEAD AND NECK CT PERFUSION BRAIN TECHNIQUE: Multidetector CT imaging of the head and neck was performed using the standard  protocol during bolus administration of intravenous contrast. Multiplanar CT image reconstructions and MIPs were obtained to evaluate the vascular anatomy. Carotid stenosis measurements (when applicable) are obtained utilizing NASCET criteria, using the distal internal carotid diameter as the denominator. Multiphase CT imaging of the brain was performed following IV bolus contrast injection. Subsequent parametric perfusion maps were calculated using RAPID software. CONTRAST:  ISOVUE-370 IOPAMIDOL (ISOVUE-370) INJECTION 76% COMPARISON:  Head MRA 12/29/2013.  Neck MRA 06/30/2005 FINDINGS: CTA NECK FINDINGS Aortic arch: Standard 3 vessel aortic arch. Widely patent arch vessel origins. Right carotid system: Patent with predominantly soft plaque in the common carotid artery resulting in less than 50% narrowing. Minimal calcified plaque at the carotid bifurcation. No ICA stenosis. Left carotid system: Patent with predominantly soft plaque in the distal common carotid artery resulting in 50% stenosis. Moderate calcified plaque about the carotid bifurcation. No ICA stenosis. Vertebral arteries: Patent with the left being slightly dominant. Mild proximal right  V2 stenosis due to calcified plaque. Calcified and soft plaque in the left V1 segment with mild multifocal stenoses. Skeleton: Mild cervical disc degeneration. Other neck: Multiple thyroid nodules including a 2.9 cm exophytic. Nodule extending inferiorly from the isthmus. Upper chest: Clear lung apices. Review of the MIP images confirms the above findings CTA HEAD FINDINGS Anterior circulation: The internal carotid arteries are patent from skull base to carotid termini with calcified plaque resulting in mild cavernous and supraclinoid stenosis on the right. The M1 segments are patent with mild stenosis on the right. The left M2 inferior division is occluded at its origin with some reconstitution of more distal M2 and M3 branches, however there are numerous missing distal branches in the left parietal region corresponding to the acute infarct. The prior MRA also suggested a severe stenosis or possibly short segment occlusion of the left M2 inferior division, however motion artifact on that study makes it difficult to make a detailed assessment of interval changes. There are also likely some missing distal left MCA superior division branch vessels. There also severe proximal right M2 stenoses which are at least partially chronic. The right A1 segment is occluded. The left A1 segment is patent but demonstrates a severe proximal stenosis. Both A2 segments are heavily diseased with multifocal severe stenoses and or short segment occlusions. The appearance of the ACAs is grossly similar to the prior motion degraded MRA. No aneurysm. Posterior circulation: The intracranial vertebral arteries are patent to the basilar. There is right greater than left V4 segment calcified plaque without significant stenosis. Patent SCA origins are identified bilaterally. PICA and AICA origins are not well evaluated. The basilar artery is widely patent. Posterior communicating arteries are diminutive or absent. PCAs are patent with mild left  P2 stenosis. No aneurysm. Venous sinuses: Inadequately assessed due to arterial phase contrast timing. Anatomic variants: None. Delayed phase: Not performed. Review of the MIP images confirms the above findings CT Brain Perfusion Findings: CBF (<30%) Volume: 28mL Perfusion (Tmax>6.0s) volume: , however a substantial amount of this is located in the right cerebral hemisphere Mismatch Volume: though with the caveat that this is artifactually inflated due to (likely chronic) prolonged perfusion in the right cerebral hemisphere. There is still however an apparent sizable penumbra in the posterior left cerebral hemisphere. Infarction Location:Predominantly left parietal lobe with a small amount of posterior left frontal lobe, posterior left temporal lobe, and posterior insula involvement. IMPRESSION: 1. 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 2. Left M2 inferior division occlusion at its origin with some  distal reconstitution. Severe stenosis (or possibly short segment occlusion) in this location on a prior MRA. Numerous missing distal branch vessels in the left parietal region corresponding to the acute infarct. 3. Advanced intracranial atherosclerosis with numerous severe stenoses as above. 4. 50% left common carotid artery stenosis. 5.  Aortic Atherosclerosis (ICD10-I70.0). 6. Thyroid nodules measuring up to 2.9 cm. Nonemergent thyroid ultrasound recommended for further evaluation. These results were communicated to Dr. Amada Jupiter at 10:18 pm on 07/05/2017 by text page via the [facility] messaging system. Electronically Signed   By: Sebastian Ache M.D.   On: 07/05/2017 22:47   Dg Chest Port 1 View  Result Date: 07/06/2017 CLINICAL DATA:  Cardiomyopathy, coronary artery disease with stent placement, chronic renal insufficiency. EXAM: PORTABLE CHEST 1 VIEW COMPARISON:  Chest x-ray of March 04, 2017 FINDINGS: The lungs are borderline hypoinflated but clear. The heart is top-normal in size.  The pulmonary vascularity is normal. There is no pleural effusion. There are post CABG changes. There is a vascular graft in the left axillary region. The bony thorax exhibits no acute abnormality. IMPRESSION: Top-normal cardiac size without pulmonary edema or pleural effusion. No pneumonia. Electronically Signed   By: David  [place] M.D.   On: 07/06/2017 07:57   Ir Percutaneous Art Thrombectomy/infusion Intracranial Inc Diag Angio  Result Date: 07/07/2017 INDICATION: Flaccid right and arm leg. Left gaze deviation. Dysarthria. Occluded inferior division of the left middle cerebral artery. EXAM: 1. EMERGENT LARGE VESSEL OCCLUSION THROMBOLYSIS (anterior CIRCULATION) COMPARISON:  CT angiogram of the head and neck of 07/05/2017. MEDICATIONS: Ancef 2 g IV antibiotic was administered within 1 hour of the procedure. ANESTHESIA/SEDATION: General anesthesia. CONTRAST:  Isovue 300 approximately 70 cc. FLUOROSCOPY TIME:  Fluoroscopy Time: 27 minutes 12 seconds (2951 mGy). COMPLICATIONS: None immediate. TECHNIQUE: Following a full explanation of the procedure along with the potential associated complications, an informed witnessed consent was obtained. The risks of intracranial hemorrhage of 10%, worsening neurological deficit, ventilator dependency, death and inability to revascularize were all reviewed in detail with the patient's family. The patient was then put under general anesthesia by the Department of Anesthesiology at [facility]. The right groin was prepped and draped in the usual sterile fashion. Thereafter using modified Seldinger technique, transfemoral access into the right common femoral artery was obtained without difficulty. Over a 0.035 inch guidewire a 5 French Pinnacle sheath was inserted. Through this, and also over a 0.035 inch guidewire a 5 [place] JB 1 catheter was advanced to the aortic arch region and selectively positioned in the right common carotid artery, the right vertebral artery, the  left vertebral artery and the left common carotid artery. FINDINGS: The left common carotid arteriogram demonstrates approximately 50% stenosis of the mid segment of the left common carotid artery. The left external carotid artery and its major branches are widely patent. The left internal carotid artery at the bulb demonstrates wide patency. More distally, there is mild decreased caliber of the left internal carotid artery in its entirety. The petrous, cavernous and supraclinoid segments demonstrate wide patency. The left anterior cerebral artery demonstrates complete occlusion distal to the left A1 segment. This is associated with caliber irregularity in the A1 segment. The left middle cerebral artery M1 segment proximally demonstrates mild caliber irregularity. There is patency of the superior division of the left middle cerebral artery with delayed reconstitution of the distal inferior division of the left middle cerebral artery in the M2 M3 regions. Distal to this the M3 branches and the M4 branches demonstrated  gradual ascent of contrast. The inferior division appears to be completely occluded associated with a stump. A diminutive anterior temporal branch is noted. A prominent anterior choroidal artery on the left is noted with delayed arterial blush noted in the region of the splenium of the corpus callosum. The whole head delayed arterial phase in the lateral projection demonstrates a prominent area of hypoperfusion involving the left parietal cortical and subcortical regions. The left vertebral artery origin is from the thyrocervical trunk. The vessel is seen to opacify to the cranial skull base. Wide patency is seen of the left vertebrobasilar junction and the left posterior inferior cerebellar artery. The basilar artery, the posterior cerebral arteries, the superior cerebellar arteries and the anterior-inferior cerebellar arteries opacify into the capillary and venous phases. Prominent collaterals are  seen in the P3 region of the left posterior cerebral artery retrogradely filling the posterior 1/3 of the left parietal cortical and subcortical areas. The delayed arterial phase then demonstrates retrograde opacification of the distal pericallosal artery in the splenium of the corpus callosum from the lateral thalamo perforator branches. Also demonstrated is retrograde opacification of the posterior half of the anterior temporal region from the posterior temporal branches of the P1 P2 region of the left posterior cerebral artery. The right common carotid arteriogram demonstrates mild narrowing in the mid third of the right common carotid artery. The right external carotid artery and its major branches are widely patent. The right internal carotid artery at the bulb to the cranial skull base is seen to opacify unimpeded. The petrous segment is normal. There is mild narrowing of the caval cavernous segment of the right internal carotid artery. Distal to this the supraclinoid segment demonstrates mild decrease in caliber. The right middle cerebral artery demonstrates a 50% stenosis in the distal right M1 segment. The proximal superior and the inferior divisions demonstrate moderate stenosis. Again demonstrated is a prominent anterior choroidal artery on the right side. The delayed arterial phase demonstrates hypoperfusion of the parietal cortical and subcortical regions. The right vertebral artery origin is from the right thyrocervical trunk. Again this vessel is seen to opacify to the cranial skull base. Wide patency is seen of the right vertebrobasilar junction and the right posterior-inferior cerebellar artery. Multiple focal areas of caliber irregularity are seen involving the posterior-inferior cerebellar artery. The opacified portion of the basilar artery, the proximal posterior cerebral arteries, the superior cerebellar arteries and the anterior-inferior cerebellar arteries are seen to opacify into the  capillary and venous phases. Extensive collaterals are again seen from the P3 regions of both posterior cerebral arteries contributing to the posterior parietal middle cerebral artery distributions bilaterally as described earlier. PROCEDURE: ENDOVASCULAR TREATMENT OF OCCLUDED DOMINANT INFERIOR DIVISION OF THE LEFT MIDDLE CEREBRAL ARTERY The diagnostic JB 1 catheter in the left common carotid artery was exchanged over a 0.035 inch 8 [place] 55 cm Brite tip neurovascular sheath using biplane roadmap technique and constant fluoroscopic guidance. Good aspiration obtained from the hub of the 8 [place] Brite tip neurovascular sheath. A gentle contrast injection demonstrated no evidence of spasms, dissections or of intraluminal filling defects. This was then connected to continuous heparinized saline infusion. Over the [facility] exchange guidewire, an 85 cm 8 [place] of balloon FlowGate guide catheter which had been prepped with 50% contrast and 50% heparinized saline infusion was advanced and positioned just proximal to the left common carotid bifurcation. The guidewire was removed. Good aspiration was obtained at the hub of the [facility] guide catheter. Over a 0.035 inch Roadrunner  guidewire, using biplane roadmap technique and constant fluoroscopic guidance, the FlowGate guide catheter was then advanced to the mid cervical left ICA. The guidewire was removed. Again good aspiration was obtained from the hub of the 8 [place] FlowGate guide catheter. A gentle control arteriogram performed centered extracranially and intracranially demonstrated no change in the intracranial circulation. Over a 0.014 inch Softip Synchro micro guidewire, a Trevo ProVue 021 microcatheter was advanced to the distal end of the of FlowGate guide catheter. With the micro guidewire leading with a J-tip configuration, the combination was navigated to the supraclinoid left ICA. The micro guidewire was then advanced gently into the inferior division origin  followed by the microcatheter. A gentle manipulation of the micro guidewire was then performed using a torque device to encourage advancement through the occluded left middle cerebral artery. The microcatheter was then brought into close proximity to the site of the occlusion. Further manipulation was performed with partial advancement of the micro guidewire through the occluded left middle cerebral artery inferior division. To avoid potential perforation of the more distal inferior division the micro guidewire was retrieved. Good aspiration obtained from the hub of the microcatheter. Approximately 3.6 mg of super selective intracranial intra-arterial Integrilin was infused over a couple of minutes. Thereafter, further attempts were made to encourage advancement of a very soft microguidewire again with only partial success. 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 The procedure was then stopped. The final control arteriogram performed through the [facility] guide catheter in the left internal carotid artery continued to demonstrate no significant change in the intracranial circulation. No angiographic evidence of extravasation or mass-effect or midline shift was seen. A Dyna CT of the head performed revealed no evidence of hemorrhage or change in the ventricular size or mass effect or midline shift. The [facility] guide catheter and the neurovascular sheath were then exchanged over a J-tip 0.035 guidewire for an 8 [place] Pinnacle sheath. This was then removed with successful hemostasis being achieved by manual compression. At the end of the procedure, the patient's right groin appeared soft without evidence of bleeding or hematoma. Distal pulses continued to be palpable in the dorsalis pedis, and the posterior tibial regions unchanged from prior to the procedure. The patient's general anesthesia was then reversed. The patient was extubated without difficulty. Upon recovery the patient was noted to be  able to raise his right arm, and also bend his right knee. He responded appropriately to simple commands. He was then transferred to the PACU and then to neuro ICU for further management. IMPRESSION: Status post attempted revascularization of occluded dominant inferior division of the right middle cerebral artery using 3.6 mg of super selective intracranial intra-arterial Integrilin and mechanical thrombolysis with a micro guidewire. PLAN: Further management as per stroke team. Electronically Signed   By: Julieanne Cotton M.D.   On: 07/06/2017 10:05   Ct Head Code Stroke Wo Contrast  Result Date: 07/05/2017 CLINICAL DATA:  Code stroke. Right hemiparesis and difficulty speaking. EXAM: CT HEAD WITHOUT CONTRAST TECHNIQUE: Contiguous axial images were obtained from the base of the skull through the vertex without intravenous contrast. COMPARISON:  Brain MRI 04/27/2016 FINDINGS: Brain: There is loss of gray-white differentiation consistent with acute infarction involving the posterior left insula and left parietal lobe extending towards the vertex. No acute intracranial hemorrhage, mass, midline shift, or extra-axial fluid collection is identified. Chronic lacunar infarcts are present in the basal ganglia and thalami bilaterally. There is a chronic right temporoparietal infarct. There is  a small chronic left cerebellar infarct. Patchy cerebral white matter hypodensities are nonspecific but compatible with chronic small vessel ischemic disease, moderately advanced for age. Mild cerebral atrophy is advanced for age. Vascular: Calcified atherosclerosis at the skull base. No hyperdense vessel. Skull: No fracture or focal osseous lesion. Sinuses/Orbits: Visualized paranasal sinuses and mastoid air cells are clear. Orbits are unremarkable. Other: None. ASPECTS [facility] Stroke Program Early CT Score) - Ganglionic level infarction (caudate, lentiform nuclei, internal capsule, insula, M1-M3 cortex): 5-6 - Supraganglionic  infarction (M4-M6 cortex): 2 Total score (0-10 with 10 being normal): 7-8 IMPRESSION: 1. Acute left MCA infarct involving the insula and parietal lobe. No hemorrhage. 2. ASPECTS is 7-8. 3. Age advanced chronic ischemia. These results were communicated to Dr. Amada Jupiter at 9:54 pm on 07/05/2017 by text page via the [facility] messaging system. Electronically Signed   By: Sebastian Ache M.D.   On: 07/05/2017 21:59   Ir Angio Intra Extracran Sel Com Carotid Innominate Uni R Mod Sed  Result Date: 07/07/2017 INDICATION: Flaccid right and arm leg. Left gaze deviation. Dysarthria. Occluded inferior division of the left middle cerebral artery. EXAM: 1. EMERGENT LARGE VESSEL OCCLUSION THROMBOLYSIS (anterior CIRCULATION) COMPARISON:  CT angiogram of the head and neck of 07/05/2017. MEDICATIONS: Ancef 2 g IV antibiotic was administered within 1 hour of the procedure. ANESTHESIA/SEDATION: General anesthesia. CONTRAST:  Isovue 300 approximately 70 cc. FLUOROSCOPY TIME:  Fluoroscopy Time: 27 minutes 12 seconds (2951 mGy). COMPLICATIONS: None immediate. TECHNIQUE: Following a full explanation of the procedure along with the potential associated complications, an informed witnessed consent was obtained. The risks of intracranial hemorrhage of 10%, worsening neurological deficit, ventilator dependency, death and inability to revascularize were all reviewed in detail with the patient's family. The patient was then put under general anesthesia by the Department of Anesthesiology at [facility]. The right groin was prepped and draped in the usual sterile fashion. Thereafter using modified Seldinger technique, transfemoral access into the right common femoral artery was obtained without difficulty. Over a 0.035 inch guidewire a 5 French Pinnacle sheath was inserted. Through this, and also over a 0.035 inch guidewire a 5 [place] JB 1 catheter was advanced to the aortic arch region and selectively positioned in the right common  carotid artery, the right vertebral artery, the left vertebral artery and the left common carotid artery. FINDINGS: The left common carotid arteriogram demonstrates approximately 50% stenosis of the mid segment of the left common carotid artery. The left external carotid artery and its major branches are widely patent. The left internal carotid artery at the bulb demonstrates wide patency. More distally, there is mild decreased caliber of the left internal carotid artery in its entirety. The petrous, cavernous and supraclinoid segments demonstrate wide patency. The left anterior cerebral artery demonstrates complete occlusion distal to the left A1 segment. This is associated with caliber irregularity in the A1 segment. The left middle cerebral artery M1 segment proximally demonstrates mild caliber irregularity. There is patency of the superior division of the left middle cerebral artery with delayed reconstitution of the distal inferior division of the left middle cerebral artery in the M2 M3 regions. Distal to this the M3 branches and the M4 branches demonstrated gradual ascent of contrast. The inferior division appears to be completely occluded associated with a stump. A diminutive anterior temporal branch is noted. A prominent anterior choroidal artery on the left is noted with delayed arterial blush noted in the region of the splenium of the corpus callosum. The whole head delayed  arterial phase in the lateral projection demonstrates a prominent area of hypoperfusion involving the left parietal cortical and subcortical regions. The left vertebral artery origin is from the thyrocervical trunk. The vessel is seen to opacify to the cranial skull base. Wide patency is seen of the left vertebrobasilar junction and the left posterior inferior cerebellar artery. The basilar artery, the posterior cerebral arteries, the superior cerebellar arteries and the anterior-inferior cerebellar arteries opacify into the capillary  and venous phases. Prominent collaterals are seen in the P3 region of the left posterior cerebral artery retrogradely filling the posterior 1/3 of the left parietal cortical and subcortical areas. The delayed arterial phase then demonstrates retrograde opacification of the distal pericallosal artery in the splenium of the corpus callosum from the lateral thalamo perforator branches. Also demonstrated is retrograde opacification of the posterior half of the anterior temporal region from the posterior temporal branches of the P1 P2 region of the left posterior cerebral artery. The right common carotid arteriogram demonstrates mild narrowing in the mid third of the right common carotid artery. The right external carotid artery and its major branches are widely patent. The right internal carotid artery at the bulb to the cranial skull base is seen to opacify unimpeded. The petrous segment is normal. There is mild narrowing of the caval cavernous segment of the right internal carotid artery. Distal to this the supraclinoid segment demonstrates mild decrease in caliber. The right middle cerebral artery demonstrates a 50% stenosis in the distal right M1 segment. The proximal superior and the inferior divisions demonstrate moderate stenosis. Again demonstrated is a prominent anterior choroidal artery on the right side. The delayed arterial phase demonstrates hypoperfusion of the parietal cortical and subcortical regions. The right vertebral artery origin is from the right thyrocervical trunk. Again this vessel is seen to opacify to the cranial skull base. Wide patency is seen of the right vertebrobasilar junction and the right posterior-inferior cerebellar artery. Multiple focal areas of caliber irregularity are seen involving the posterior-inferior cerebellar artery. The opacified portion of the basilar artery, the proximal posterior cerebral arteries, the superior cerebellar arteries and the anterior-inferior cerebellar  arteries are seen to opacify into the capillary and venous phases. Extensive collaterals are again seen from the P3 regions of both posterior cerebral arteries contributing to the posterior parietal middle cerebral artery distributions bilaterally as described earlier. PROCEDURE: ENDOVASCULAR TREATMENT OF OCCLUDED DOMINANT INFERIOR DIVISION OF THE LEFT MIDDLE CEREBRAL ARTERY The diagnostic JB 1 catheter in the left common carotid artery was exchanged over a 0.035 inch 8 French 55 cm Brite tip neurovascular sheath using biplane roadmap technique and constant fluoroscopic guidance. Good aspiration obtained from the hub of the 8 [place] Brite tip neurovascular sheath. A gentle contrast injection demonstrated no evidence of spasms, dissections or of intraluminal filling defects. This was then connected to continuous heparinized saline infusion. Over the [facility] exchange guidewire, an 85 cm 8 [place] of balloon FlowGate guide catheter which had been prepped with 50% contrast and 50% heparinized saline infusion was advanced and positioned just proximal to the left common carotid bifurcation. The guidewire was removed. Good aspiration was obtained at the hub of the [facility] guide catheter. Over a 0.035 inch Roadrunner guidewire, using biplane roadmap technique and constant fluoroscopic guidance, the FlowGate guide catheter was then advanced to the mid cervical left ICA. The guidewire was removed. Again good aspiration was obtained from the hub of the 8 [place] FlowGate guide catheter. A gentle control arteriogram performed centered extracranially and intracranially demonstrated no  change in the intracranial circulation. Over a 0.014 inch Softip Synchro micro guidewire, a Trevo ProVue 021 microcatheter was advanced to the distal end of the of FlowGate guide catheter. With the micro guidewire leading with a J-tip configuration, the combination was navigated to the supraclinoid left ICA. The micro guidewire was then advanced  gently into the inferior division origin followed by the microcatheter. A gentle manipulation of the micro guidewire was then performed using a torque device to encourage advancement through the occluded left middle cerebral artery. The microcatheter was then brought into close proximity to the site of the occlusion. Further manipulation was performed with partial advancement of the micro guidewire through the occluded left middle cerebral artery inferior division. To avoid potential perforation of the more distal inferior division the micro guidewire was retrieved. Good aspiration obtained from the hub of the microcatheter. Approximately 3.6 mg of super selective intracranial intra-arterial Integrilin was infused over a couple of minutes. Thereafter, further attempts were made to encourage advancement of a very soft microguidewire again with only partial success. 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 The procedure was then stopped. The final control arteriogram performed through the [facility] guide catheter in the left internal carotid artery continued to demonstrate no significant change in the intracranial circulation. No angiographic evidence of extravasation or mass-effect or midline shift was seen. A Dyna CT of the head performed revealed no evidence of hemorrhage or change in the ventricular size or mass effect or midline shift. The [facility] guide catheter and the neurovascular sheath were then exchanged over a J-tip 0.035 guidewire for an 8 [place] Pinnacle sheath. This was then removed with successful hemostasis being achieved by manual compression. At the end of the procedure, the patient's right groin appeared soft without evidence of bleeding or hematoma. Distal pulses continued to be palpable in the dorsalis pedis, and the posterior tibial regions unchanged from prior to the procedure. The patient's general anesthesia was then reversed. The patient was extubated without difficulty.  Upon recovery the patient was noted to be able to raise his right arm, and also bend his right knee. He responded appropriately to simple commands. He was then transferred to the PACU and then to neuro ICU for further management. IMPRESSION: Status post attempted revascularization of occluded dominant inferior division of the right middle cerebral artery using 3.6 mg of super selective intracranial intra-arterial Integrilin and mechanical thrombolysis with a micro guidewire. PLAN: Further management as per stroke team. Electronically Signed   By: Julieanne Cotton M.D.   On: 07/06/2017 10:05   Ir Angio Vertebral Sel Vertebral Bilat Mod Sed  Result Date: 07/07/2017 INDICATION: Flaccid right and arm leg. Left gaze deviation. Dysarthria. Occluded inferior division of the left middle cerebral artery. EXAM: 1. EMERGENT LARGE VESSEL OCCLUSION THROMBOLYSIS (anterior CIRCULATION) COMPARISON:  CT angiogram of the head and neck of 07/05/2017. MEDICATIONS: Ancef 2 g IV antibiotic was administered within 1 hour of the procedure. ANESTHESIA/SEDATION: General anesthesia. CONTRAST:  Isovue 300 approximately 70 cc. FLUOROSCOPY TIME:  Fluoroscopy Time: 27 minutes 12 seconds (2951 mGy). COMPLICATIONS: None immediate. TECHNIQUE: Following a full explanation of the procedure along with the potential associated complications, an informed witnessed consent was obtained. The risks of intracranial hemorrhage of 10%, worsening neurological deficit, ventilator dependency, death and inability to revascularize were all reviewed in detail with the patient's family. The patient was then put under general anesthesia by the Department of Anesthesiology at [facility]. The right groin was prepped and draped  in the usual sterile fashion. Thereafter using modified Seldinger technique, transfemoral access into the right common femoral artery was obtained without difficulty. Over a 0.035 inch guidewire a 5 French Pinnacle sheath was  inserted. Through this, and also over a 0.035 inch guidewire a 5 [place] JB 1 catheter was advanced to the aortic arch region and selectively positioned in the right common carotid artery, the right vertebral artery, the left vertebral artery and the left common carotid artery. FINDINGS: The left common carotid arteriogram demonstrates approximately 50% stenosis of the mid segment of the left common carotid artery. The left external carotid artery and its major branches are widely patent. The left internal carotid artery at the bulb demonstrates wide patency. More distally, there is mild decreased caliber of the left internal carotid artery in its entirety. The petrous, cavernous and supraclinoid segments demonstrate wide patency. The left anterior cerebral artery demonstrates complete occlusion distal to the left A1 segment. This is associated with caliber irregularity in the A1 segment. The left middle cerebral artery M1 segment proximally demonstrates mild caliber irregularity. There is patency of the superior division of the left middle cerebral artery with delayed reconstitution of the distal inferior division of the left middle cerebral artery in the M2 M3 regions. Distal to this the M3 branches and the M4 branches demonstrated gradual ascent of contrast. The inferior division appears to be completely occluded associated with a stump. A diminutive anterior temporal branch is noted. A prominent anterior choroidal artery on the left is noted with delayed arterial blush noted in the region of the splenium of the corpus callosum. The whole head delayed arterial phase in the lateral projection demonstrates a prominent area of hypoperfusion involving the left parietal cortical and subcortical regions. The left vertebral artery origin is from the thyrocervical trunk. The vessel is seen to opacify to the cranial skull base. Wide patency is seen of the left vertebrobasilar junction and the left posterior inferior  cerebellar artery. The basilar artery, the posterior cerebral arteries, the superior cerebellar arteries and the anterior-inferior cerebellar arteries opacify into the capillary and venous phases. Prominent collaterals are seen in the P3 region of the left posterior cerebral artery retrogradely filling the posterior 1/3 of the left parietal cortical and subcortical areas. The delayed arterial phase then demonstrates retrograde opacification of the distal pericallosal artery in the splenium of the corpus callosum from the lateral thalamo perforator branches. Also demonstrated is retrograde opacification of the posterior half of the anterior temporal region from the posterior temporal branches of the P1 P2 region of the left posterior cerebral artery. The right common carotid arteriogram demonstrates mild narrowing in the mid third of the right common carotid artery. The right external carotid artery and its major branches are widely patent. The right internal carotid artery at the bulb to the cranial skull base is seen to opacify unimpeded. The petrous segment is normal. There is mild narrowing of the caval cavernous segment of the right internal carotid artery. Distal to this the supraclinoid segment demonstrates mild decrease in caliber. The right middle cerebral artery demonstrates a 50% stenosis in the distal right M1 segment. The proximal superior and the inferior divisions demonstrate moderate stenosis. Again demonstrated is a prominent anterior choroidal artery on the right side. The delayed arterial phase demonstrates hypoperfusion of the parietal cortical and subcortical regions. The right vertebral artery origin is from the right thyrocervical trunk. Again this vessel is seen to opacify to the cranial skull base. Wide patency is seen of  the right vertebrobasilar junction and the right posterior-inferior cerebellar artery. Multiple focal areas of caliber irregularity are seen involving the posterior-inferior  cerebellar artery. The opacified portion of the basilar artery, the proximal posterior cerebral arteries, the superior cerebellar arteries and the anterior-inferior cerebellar arteries are seen to opacify into the capillary and venous phases. Extensive collaterals are again seen from the P3 regions of both posterior cerebral arteries contributing to the posterior parietal middle cerebral artery distributions bilaterally as described earlier. PROCEDURE: ENDOVASCULAR TREATMENT OF OCCLUDED DOMINANT INFERIOR DIVISION OF THE LEFT MIDDLE CEREBRAL ARTERY The diagnostic JB 1 catheter in the left common carotid artery was exchanged over a 0.035 inch 8 French 55 cm Brite tip neurovascular sheath using biplane roadmap technique and constant fluoroscopic guidance. Good aspiration obtained from the hub of the 8 [place] Brite tip neurovascular sheath. A gentle contrast injection demonstrated no evidence of spasms, dissections or of intraluminal filling defects. This was then connected to continuous heparinized saline infusion. Over the [facility] exchange guidewire, an 85 cm 8 [place] of balloon FlowGate guide catheter which had been prepped with 50% contrast and 50% heparinized saline infusion was advanced and positioned just proximal to the left common carotid bifurcation. The guidewire was removed. Good aspiration was obtained at the hub of the [facility] guide catheter. Over a 0.035 inch Roadrunner guidewire, using biplane roadmap technique and constant fluoroscopic guidance, the FlowGate guide catheter was then advanced to the mid cervical left ICA. The guidewire was removed. Again good aspiration was obtained from the hub of the 8 [place] FlowGate guide catheter. A gentle control arteriogram performed centered extracranially and intracranially demonstrated no change in the intracranial circulation. Over a 0.014 inch Softip Synchro micro guidewire, a Trevo ProVue 021 microcatheter was advanced to the distal end of the of FlowGate  guide catheter. With the micro guidewire leading with a J-tip configuration, the combination was navigated to the supraclinoid left ICA. The micro guidewire was then advanced gently into the inferior division origin followed by the microcatheter. A gentle manipulation of the micro guidewire was then performed using a torque device to encourage advancement through the occluded left middle cerebral artery. The microcatheter was then brought into close proximity to the site of the occlusion. Further manipulation was performed with partial advancement of the micro guidewire through the occluded left middle cerebral artery inferior division. To avoid potential perforation of the more distal inferior division the micro guidewire was retrieved. Good aspiration obtained from the hub of the microcatheter. Approximately 3.6 mg of super selective intracranial intra-arterial Integrilin was infused over a couple of minutes. Thereafter, further attempts were made to encourage advancement of a very soft microguidewire again with only partial success. 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 The procedure was then stopped. The final control arteriogram performed through the [facility] guide catheter in the left internal carotid artery continued to demonstrate no significant change in the intracranial circulation. No angiographic evidence of extravasation or mass-effect or midline shift was seen. A Dyna CT of the head performed revealed no evidence of hemorrhage or change in the ventricular size or mass effect or midline shift. The [facility] guide catheter and the neurovascular sheath were then exchanged over a J-tip 0.035 guidewire for an 8 [place] Pinnacle sheath. This was then removed with successful hemostasis being achieved by manual compression. At the end of the procedure, the patient's right groin appeared soft without evidence of bleeding or hematoma. Distal pulses continued to be palpable in the dorsalis  pedis,  and the posterior tibial regions unchanged from prior to the procedure. The patient's general anesthesia was then reversed. The patient was extubated without difficulty. Upon recovery the patient was noted to be able to raise his right arm, and also bend his right knee. He responded appropriately to simple commands. He was then transferred to the PACU and then to neuro ICU for further management. IMPRESSION: Status post attempted revascularization of occluded dominant inferior division of the right middle cerebral artery using 3.6 mg of super selective intracranial intra-arterial Integrilin and mechanical thrombolysis with a micro guidewire. PLAN: Further management as per stroke team. Electronically Signed   By: Julieanne Cotton M.D.   On: 07/06/2017 10:05    Scheduled: .  stroke: mapping our early stages of recovery book   Does not apply Once  . aspirin EC  325 mg Oral Daily  . atorvastatin  80 mg Oral Daily  . Chlorhexidine Gluconate Cloth  6 each Topical Q0600  . clopidogrel  75 mg Oral Daily  . [START ON 07/08/2017] doxercalciferol  2 mcg Intravenous Q M,W,F-HD  . enoxaparin (LOVENOX) injection  30 mg Subcutaneous Q24H  . febuxostat  40 mg Oral Daily  . feeding supplement (NEPRO CARB STEADY)  237 mL Oral BID BM  . mouth rinse  15 mL Mouth Rinse BID  . memantine  10 mg Oral BID  . mupirocin ointment  1 application Nasal BID      LOS: 2 days   Lauris Poag 07/07/2017,6:53 PM

## 2017-07-07 NOTE — Progress Notes (Signed)
STROKE TEAM PROGRESS NOTE   SUBJECTIVE (INTERVAL HISTORY) His HD tech is at the bedside. Pt is undergoing HD at this time. BP dropping to 90s. And put pt in supine position and decreased fluid taken off from HD. SBP improved to 115. 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    OBJECTIVE Temp:  [97.5 F (36.4 C)-98.4 F (36.9 C)] 97.5 F (36.4 C) (04/23 0800) Pulse Rate:  [29-149] 96 (04/23 1115) Cardiac Rhythm: Normal sinus rhythm (04/23 0800) Resp:  [14-25] 21 (04/23 1115) BP: (91-150)/(61-114) 92/80 (04/23 1115) SpO2:  [80 %-100 %] 100 % (04/23 1100) Weight:  [196 lb 13.9 oz (89.3 kg)] 196 lb 13.9 oz (89.3 kg) (04/23 0700)  Recent Labs  Lab 07/05/17 2228 07/06/17 0158  GLUCAP 125* 97   Recent Labs  Lab 07/05/17 2215 07/05/17 2232 07/06/17 0510 07/07/17 0804  NA 136 133* 133* 135  K 4.0 4.8 3.5 3.4*  CL 94* 98* 101 100*  CO2 20*  --  18* 21*  GLUCOSE 135* 130* 106* 89  BUN 39* 48* 35* 26*  CREATININE 14.46* 14.80* 13.49* 10.40*  CALCIUM 10.7*  --  9.6 9.2  PHOS  --   --   --  4.5   Recent Labs  Lab 07/05/17 2215 07/07/17 0804  AST 15  --   ALT 9*  --   ALKPHOS 40  --   BILITOT 1.0  --   PROT 7.4  --   ALBUMIN 3.9 3.3*   Recent Labs  Lab 07/05/17 2215 07/05/17 2232 07/06/17 0510 07/07/17 0806  WBC 14.8*  --  12.0* 7.5  NEUTROABS 12.7*  --  9.0*  --   HGB 13.2 12.6* 11.4* 11.7*  HCT 38.9* 37.0* 32.9* 34.9*  MCV 92.4  --  91.1 90.9  PLT 191  --  197 187   No results for input(s): CKTOTAL, CKMB, CKMBINDEX, TROPONINI in the last 168 hours. Recent Labs    07/05/17 2215  LABPROT 14.7  INR 1.16   No results for input(s): COLORURINE, LABSPEC, PHURINE, GLUCOSEU, HGBUR, BILIRUBINUR, KETONESUR, PROTEINUR, UROBILINOGEN, NITRITE, LEUKOCYTESUR in the last 72 hours.  Invalid input(s): APPERANCEUR     Component Value Date/Time   CHOL 180 07/06/2017 0510   CHOL 208 (H) 03/19/2017 1152   TRIG 98 07/06/2017 0510   HDL 29 (L) 07/06/2017 0510   HDL 40  03/19/2017 1152   CHOLHDL 6.2 07/06/2017 0510   VLDL 20 07/06/2017 0510   LDLCALC 131 (H) 07/06/2017 0510   LDLCALC 139 (H) 03/19/2017 1152   Lab Results  Component Value Date   HGBA1C 4.6 (L) 07/06/2017      Component Value Date/Time   LABOPIA NONE DETECTED 12/30/2013 1003   COCAINSCRNUR NONE DETECTED 12/30/2013 1003   LABBENZ POSITIVE (A) 12/30/2013 1003   AMPHETMU NONE DETECTED 12/30/2013 1003   THCU NONE DETECTED 12/30/2013 1003   LABBARB NONE DETECTED 12/30/2013 1003    No results for input(s): ETH in the last 168 hours.  I have personally reviewed the radiological images below and agree with the radiology interpretations.  Ct Angio Head W Or Wo Contrast  Result Date: 07/05/2017 CLINICAL DATA:  Right hemiparesis and difficulty speaking. EXAM: CT ANGIOGRAPHY HEAD AND NECK CT PERFUSION BRAIN TECHNIQUE: Multidetector CT imaging of the head and neck was performed using the standard protocol during bolus administration of intravenous contrast. Multiplanar CT image reconstructions and MIPs were obtained to evaluate the vascular anatomy. Carotid stenosis measurements (when applicable) are obtained utilizing NASCET criteria, using  the distal internal carotid diameter as the denominator. Multiphase CT imaging of the brain was performed following IV bolus contrast injection. Subsequent parametric perfusion maps were calculated using RAPID software. CONTRAST:  ISOVUE-370 IOPAMIDOL (ISOVUE-370) INJECTION 76% COMPARISON:  Head MRA 12/29/2013.  Neck MRA 06/30/2005 FINDINGS: CTA NECK FINDINGS Aortic arch: Standard 3 vessel aortic arch. Widely patent arch vessel origins. Right carotid system: Patent with predominantly soft plaque in the common carotid artery resulting in less than 50% narrowing. Minimal calcified plaque at the carotid bifurcation. No ICA stenosis. Left carotid system: Patent with predominantly soft plaque in the distal common carotid artery resulting in 50% stenosis. Moderate  calcified plaque about the carotid bifurcation. No ICA stenosis. Vertebral arteries: Patent with the left being slightly dominant. Mild proximal right V2 stenosis due to calcified plaque. Calcified and soft plaque in the left V1 segment with mild multifocal stenoses. Skeleton: Mild cervical disc degeneration. Other neck: Multiple thyroid nodules including a 2.9 cm exophytic. Nodule extending inferiorly from the isthmus. Upper chest: Clear lung apices. Review of the MIP images confirms the above findings CTA HEAD FINDINGS Anterior circulation: The internal carotid arteries are patent from skull base to carotid termini with calcified plaque resulting in mild cavernous and supraclinoid stenosis on the right. The M1 segments are patent with mild stenosis on the right. The left M2 inferior division is occluded at its origin with some reconstitution of more distal M2 and M3 branches, however there are numerous missing distal branches in the left parietal region corresponding to the acute infarct. The prior MRA also suggested a severe stenosis or possibly short segment occlusion of the left M2 inferior division, however motion artifact on that study makes it difficult to make a detailed assessment of interval changes. There are also likely some missing distal left MCA superior division branch vessels. There also severe proximal right M2 stenoses which are at least partially chronic. The right A1 segment is occluded. The left A1 segment is patent but demonstrates a severe proximal stenosis. Both A2 segments are heavily diseased with multifocal severe stenoses and or short segment occlusions. The appearance of the ACAs is grossly similar to the prior motion degraded MRA. No aneurysm. Posterior circulation: The intracranial vertebral arteries are patent to the basilar. There is right greater than left V4 segment calcified plaque without significant stenosis. Patent SCA origins are identified bilaterally. PICA and AICA origins  are not well evaluated. The basilar artery is widely patent. Posterior communicating arteries are diminutive or absent. PCAs are patent with mild left P2 stenosis. No aneurysm. Venous sinuses: Inadequately assessed due to arterial phase contrast timing. Anatomic variants: None. Delayed phase: Not performed. Review of the MIP images confirms the above findings CT Brain Perfusion Findings: CBF (<30%) Volume: 28mL Perfusion (Tmax>6.0s) volume: , however a substantial amount of this is located in the right cerebral hemisphere Mismatch Volume: though with the caveat that this is artifactually inflated due to (likely chronic) prolonged perfusion in the right cerebral hemisphere. There is still however an apparent sizable penumbra in the posterior left cerebral hemisphere. Infarction Location:Predominantly left parietal lobe with a small amount of posterior left frontal lobe, posterior left temporal lobe, and posterior insula involvement. IMPRESSION: 1. 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 2. Left M2 inferior division occlusion at its origin with some distal reconstitution. Severe stenosis (or possibly short segment occlusion) in this location on a prior MRA. Numerous missing distal branch vessels in the left parietal region corresponding to the acute infarct.  3. Advanced intracranial atherosclerosis with numerous severe stenoses as above. 4. 50% left common carotid artery stenosis. 5.  Aortic Atherosclerosis (ICD10-I70.0). 6. Thyroid nodules measuring up to 2.9 cm. Nonemergent thyroid ultrasound recommended for further evaluation. These results were communicated to Dr. Amada Jupiter at 10:18 pm on 07/05/2017 by text page via the [facility] messaging system. Electronically Signed   By: Sebastian Ache M.D.   On: 07/05/2017 22:47   Ct Head Wo Contrast  Result Date: 07/06/2017 CLINICAL DATA:  Stroke follow-up EXAM: CT HEAD WITHOUT CONTRAST TECHNIQUE: Contiguous axial images were obtained from the  base of the skull through the vertex without intravenous contrast. COMPARISON:  Brain MRI 07/06/2017 Head CT 07/06/2017 at 5:35 a.m. FINDINGS: Brain: Decreased density of material of the left basal ganglia, consistent with clearing of contrast staining. Right temporal encephalomalacia is unchanged. No acute hemorrhage or mass effect. No hydrocephalus. Vascular: Small amount of residual contrast material within the vessels. Skull: Normal visualized skull base, calvarium and extracranial soft tissues. Sinuses/Orbits: No sinus fluid levels or advanced mucosal thickening. No mastoid effusion. Normal orbits. IMPRESSION: Decreased density of hyperdense material at the left basal ganglia, consistent with clearing of contrast staining. No new abnormality. Electronically Signed   By: Deatra Robinson M.D.   On: 07/06/2017 23:24   Ct Head Wo Contrast  Result Date: 07/06/2017 CLINICAL DATA:  Left MCA infarct status post intervention EXAM: CT HEAD WITHOUT CONTRAST TECHNIQUE: Contiguous axial images were obtained from the base of the skull through the vertex without intravenous contrast. COMPARISON:  Cerebral angiogram 07/05/2017 CTA head neck 07/05/2017 FINDINGS: Brain: There is hyperdensity within the left basal ganglia, likely indicating contrast staining. No midline shift or mass effect. No hydrocephalus. Unchanged right temporal lobe encephalomalacia. Vascular: There is intravascular contrast material related to prior an angiographic study. Skull: Normal visualized skull base, calvarium and extracranial soft tissues. Sinuses/Orbits: No sinus fluid levels or advanced mucosal thickening. No mastoid effusion. Normal orbits. IMPRESSION: Hyperdensity of the left basal ganglia is favored to represent contrast staining over petechial hemorrhage. Electronically Signed   By: Deatra Robinson M.D.   On: 07/06/2017 05:56   Ct Angio Neck W Or Wo Contrast  Result Date: 07/05/2017 CLINICAL DATA:  Right hemiparesis and difficulty  speaking. EXAM: CT ANGIOGRAPHY HEAD AND NECK CT PERFUSION BRAIN TECHNIQUE: Multidetector CT imaging of the head and neck was performed using the standard protocol during bolus administration of intravenous contrast. Multiplanar CT image reconstructions and MIPs were obtained to evaluate the vascular anatomy. Carotid stenosis measurements (when applicable) are obtained utilizing NASCET criteria, using the distal internal carotid diameter as the denominator. Multiphase CT imaging of the brain was performed following IV bolus contrast injection. Subsequent parametric perfusion maps were calculated using RAPID software. CONTRAST:  ISOVUE-370 IOPAMIDOL (ISOVUE-370) INJECTION 76% COMPARISON:  Head MRA 12/29/2013.  Neck MRA 06/30/2005 FINDINGS: CTA NECK FINDINGS Aortic arch: Standard 3 vessel aortic arch. Widely patent arch vessel origins. Right carotid system: Patent with predominantly soft plaque in the common carotid artery resulting in less than 50% narrowing. Minimal calcified plaque at the carotid bifurcation. No ICA stenosis. Left carotid system: Patent with predominantly soft plaque in the distal common carotid artery resulting in 50% stenosis. Moderate calcified plaque about the carotid bifurcation. No ICA stenosis. Vertebral arteries: Patent with the left being slightly dominant. Mild proximal right V2 stenosis due to calcified plaque. Calcified and soft plaque in the left V1 segment with mild multifocal stenoses. Skeleton: Mild cervical disc degeneration. Other neck: Multiple thyroid nodules  including a 2.9 cm exophytic. Nodule extending inferiorly from the isthmus. Upper chest: Clear lung apices. Review of the MIP images confirms the above findings CTA HEAD FINDINGS Anterior circulation: The internal carotid arteries are patent from skull base to carotid termini with calcified plaque resulting in mild cavernous and supraclinoid stenosis on the right. The M1 segments are patent with mild stenosis on the  right. The left M2 inferior division is occluded at its origin with some reconstitution of more distal M2 and M3 branches, however there are numerous missing distal branches in the left parietal region corresponding to the acute infarct. The prior MRA also suggested a severe stenosis or possibly short segment occlusion of the left M2 inferior division, however motion artifact on that study makes it difficult to make a detailed assessment of interval changes. There are also likely some missing distal left MCA superior division branch vessels. There also severe proximal right M2 stenoses which are at least partially chronic. The right A1 segment is occluded. The left A1 segment is patent but demonstrates a severe proximal stenosis. Both A2 segments are heavily diseased with multifocal severe stenoses and or short segment occlusions. The appearance of the ACAs is grossly similar to the prior motion degraded MRA. No aneurysm. Posterior circulation: The intracranial vertebral arteries are patent to the basilar. There is right greater than left V4 segment calcified plaque without significant stenosis. Patent SCA origins are identified bilaterally. PICA and AICA origins are not well evaluated. The basilar artery is widely patent. Posterior communicating arteries are diminutive or absent. PCAs are patent with mild left P2 stenosis. No aneurysm. Venous sinuses: Inadequately assessed due to arterial phase contrast timing. Anatomic variants: None. Delayed phase: Not performed. Review of the MIP images confirms the above findings CT Brain Perfusion Findings: CBF (<30%) Volume: 28mL Perfusion (Tmax>6.0s) volume: , however a substantial amount of this is located in the right cerebral hemisphere Mismatch Volume: though with the caveat that this is artifactually inflated due to (likely chronic) prolonged perfusion in the right cerebral hemisphere. There is still however an apparent sizable penumbra in the posterior left  cerebral hemisphere. Infarction Location:Predominantly left parietal lobe with a small amount of posterior left frontal lobe, posterior left temporal lobe, and posterior insula involvement. IMPRESSION: 1. 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 2. Left M2 inferior division occlusion at its origin with some distal reconstitution. Severe stenosis (or possibly short segment occlusion) in this location on a prior MRA. Numerous missing distal branch vessels in the left parietal region corresponding to the acute infarct. 3. Advanced intracranial atherosclerosis with numerous severe stenoses as above. 4. 50% left common carotid artery stenosis. 5.  Aortic Atherosclerosis (ICD10-I70.0). 6. Thyroid nodules measuring up to 2.9 cm. Nonemergent thyroid ultrasound recommended for further evaluation. These results were communicated to Dr. Amada Jupiter at 10:18 pm on 07/05/2017 by text page via the [facility] messaging system. Electronically Signed   By: Sebastian Ache M.D.   On: 07/05/2017 22:47   Mr Maxine Glenn Head Wo Contrast  Result Date: 07/06/2017 CLINICAL DATA:  Stroke follow-up EXAM: MRI HEAD WITHOUT CONTRAST MRA HEAD WITHOUT CONTRAST TECHNIQUE: Multiplanar, multiecho pulse sequences of the brain and surrounding structures were obtained without intravenous contrast. Angiographic images of the head were obtained using MRA technique without contrast. COMPARISON:  CT, CTA, and CTP 07/05/2017.  Brain MRI 04/27/2016 FINDINGS: MRI HEAD FINDINGS Brain: Cortically based restricted diffusion in the posterior left insula, superior temporal lobe, parietal lobe, and minimally involving the posterior left frontal  lobe. There is good correlation with infarct by CT perfusion. Small remote infarcts in the left superior cerebellum, left paramedian pons, left basal ganglia/internal capsule, and right temporal parietal cortex. There is chronic blood products at the remote left basal ganglia infarct. No hemorrhage seen at the acute  infarct. No hydrocephalus or masslike finding. Vascular: Arterial findings below. Normal dural venous sinus flow voids. Skull and upper cervical spine: Negative Sinuses/Orbits: Negative MRA HEAD FINDINGS Symmetric carotid and vertebral arteries. The vertebral and basilar arteries are smooth and diffusely patent. Atheromatous irregularity of bilateral posterior cerebral arteries with high-grade left P2 segment narrowing. Essentially nonvisualized bilateral ACA attributed to advanced atheromatous narrowing based on prior CTA. Less robust flow signal seen in the left MCA with continued nonvisualization of the inferior division. The left superior division and right MCA vasculature show diffuse atheromatous irregularity. IMPRESSION: 1. Acute left MCA territory cortical infarct without detected progression when compared to CTP from yesterday. No hemorrhagic conversion. 2. Continued non visualization of the left MCA inferior division. 3. There is advanced intracranial atherosclerosis with nonvisualized ACAs and severe left P2 segment stenosis. 4. Remote small vessel infarcts as described. Electronically Signed   By: Marnee Spring M.D.   On: 07/06/2017 12:59   Mr Brain Wo Contrast  Result Date: 07/06/2017 CLINICAL DATA:  Stroke follow-up EXAM: MRI HEAD WITHOUT CONTRAST MRA HEAD WITHOUT CONTRAST TECHNIQUE: Multiplanar, multiecho pulse sequences of the brain and surrounding structures were obtained without intravenous contrast. Angiographic images of the head were obtained using MRA technique without contrast. COMPARISON:  CT, CTA, and CTP 07/05/2017.  Brain MRI 04/27/2016 FINDINGS: MRI HEAD FINDINGS Brain: Cortically based restricted diffusion in the posterior left insula, superior temporal lobe, parietal lobe, and minimally involving the posterior left frontal lobe. There is good correlation with infarct by CT perfusion. Small remote infarcts in the left superior cerebellum, left paramedian pons, left basal  ganglia/internal capsule, and right temporal parietal cortex. There is chronic blood products at the remote left basal ganglia infarct. No hemorrhage seen at the acute infarct. No hydrocephalus or masslike finding. Vascular: Arterial findings below. Normal dural venous sinus flow voids. Skull and upper cervical spine: Negative Sinuses/Orbits: Negative MRA HEAD FINDINGS Symmetric carotid and vertebral arteries. The vertebral and basilar arteries are smooth and diffusely patent. Atheromatous irregularity of bilateral posterior cerebral arteries with high-grade left P2 segment narrowing. Essentially nonvisualized bilateral ACA attributed to advanced atheromatous narrowing based on prior CTA. Less robust flow signal seen in the left MCA with continued nonvisualization of the inferior division. The left superior division and right MCA vasculature show diffuse atheromatous irregularity. IMPRESSION: 1. Acute left MCA territory cortical infarct without detected progression when compared to CTP from yesterday. No hemorrhagic conversion. 2. Continued non visualization of the left MCA inferior division. 3. There is advanced intracranial atherosclerosis with nonvisualized ACAs and severe left P2 segment stenosis. 4. Remote small vessel infarcts as described. Electronically Signed   By: Marnee Spring M.D.   On: 07/06/2017 12:59   Ct Cerebral Perfusion W Contrast  Result Date: 07/05/2017 CLINICAL DATA:  Right hemiparesis and difficulty speaking. EXAM: CT ANGIOGRAPHY HEAD AND NECK CT PERFUSION BRAIN TECHNIQUE: Multidetector CT imaging of the head and neck was performed using the standard protocol during bolus administration of intravenous contrast. Multiplanar CT image reconstructions and MIPs were obtained to evaluate the vascular anatomy. Carotid stenosis measurements (when applicable) are obtained utilizing NASCET criteria, using the distal internal carotid diameter as the denominator. Multiphase CT imaging of the brain  was  performed following IV bolus contrast injection. Subsequent parametric perfusion maps were calculated using RAPID software. CONTRAST:  ISOVUE-370 IOPAMIDOL (ISOVUE-370) INJECTION 76% COMPARISON:  Head MRA 12/29/2013.  Neck MRA 06/30/2005 FINDINGS: CTA NECK FINDINGS Aortic arch: Standard 3 vessel aortic arch. Widely patent arch vessel origins. Right carotid system: Patent with predominantly soft plaque in the common carotid artery resulting in less than 50% narrowing. Minimal calcified plaque at the carotid bifurcation. No ICA stenosis. Left carotid system: Patent with predominantly soft plaque in the distal common carotid artery resulting in 50% stenosis. Moderate calcified plaque about the carotid bifurcation. No ICA stenosis. Vertebral arteries: Patent with the left being slightly dominant. Mild proximal right V2 stenosis due to calcified plaque. Calcified and soft plaque in the left V1 segment with mild multifocal stenoses. Skeleton: Mild cervical disc degeneration. Other neck: Multiple thyroid nodules including a 2.9 cm exophytic. Nodule extending inferiorly from the isthmus. Upper chest: Clear lung apices. Review of the MIP images confirms the above findings CTA HEAD FINDINGS Anterior circulation: The internal carotid arteries are patent from skull base to carotid termini with calcified plaque resulting in mild cavernous and supraclinoid stenosis on the right. The M1 segments are patent with mild stenosis on the right. The left M2 inferior division is occluded at its origin with some reconstitution of more distal M2 and M3 branches, however there are numerous missing distal branches in the left parietal region corresponding to the acute infarct. The prior MRA also suggested a severe stenosis or possibly short segment occlusion of the left M2 inferior division, however motion artifact on that study makes it difficult to make a detailed assessment of interval changes. There are also likely some missing  distal left MCA superior division branch vessels. There also severe proximal right M2 stenoses which are at least partially chronic. The right A1 segment is occluded. The left A1 segment is patent but demonstrates a severe proximal stenosis. Both A2 segments are heavily diseased with multifocal severe stenoses and or short segment occlusions. The appearance of the ACAs is grossly similar to the prior motion degraded MRA. No aneurysm. Posterior circulation: The intracranial vertebral arteries are patent to the basilar. There is right greater than left V4 segment calcified plaque without significant stenosis. Patent SCA origins are identified bilaterally. PICA and AICA origins are not well evaluated. The basilar artery is widely patent. Posterior communicating arteries are diminutive or absent. PCAs are patent with mild left P2 stenosis. No aneurysm. Venous sinuses: Inadequately assessed due to arterial phase contrast timing. Anatomic variants: None. Delayed phase: Not performed. Review of the MIP images confirms the above findings CT Brain Perfusion Findings: CBF (<30%) Volume: 28mL Perfusion (Tmax>6.0s) volume: , however a substantial amount of this is located in the right cerebral hemisphere Mismatch Volume: though with the caveat that this is artifactually inflated due to (likely chronic) prolonged perfusion in the right cerebral hemisphere. There is still however an apparent sizable penumbra in the posterior left cerebral hemisphere. Infarction Location:Predominantly left parietal lobe with a small amount of posterior left frontal lobe, posterior left temporal lobe, and posterior insula involvement. IMPRESSION: 1. 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 2. Left M2 inferior division occlusion at its origin with some distal reconstitution. Severe stenosis (or possibly short segment occlusion) in this location on a prior MRA. Numerous missing distal branch vessels in the left parietal  region corresponding to the acute infarct. 3. Advanced intracranial atherosclerosis with numerous severe stenoses as above. 4. 50% left common carotid  artery stenosis. 5.  Aortic Atherosclerosis (ICD10-I70.0). 6. Thyroid nodules measuring up to 2.9 cm. Nonemergent thyroid ultrasound recommended for further evaluation. These results were communicated to Dr. Amada Jupiter at 10:18 pm on 07/05/2017 by text page via the [facility] messaging system. Electronically Signed   By: Sebastian Ache M.D.   On: 07/05/2017 22:47   Dg Chest Port 1 View  Result Date: 07/06/2017 CLINICAL DATA:  Cardiomyopathy, coronary artery disease with stent placement, chronic renal insufficiency. EXAM: PORTABLE CHEST 1 VIEW COMPARISON:  Chest x-ray of March 04, 2017 FINDINGS: The lungs are borderline hypoinflated but clear. The heart is top-normal in size. The pulmonary vascularity is normal. There is no pleural effusion. There are post CABG changes. There is a vascular graft in the left axillary region. The bony thorax exhibits no acute abnormality. IMPRESSION: Top-normal cardiac size without pulmonary edema or pleural effusion. No pneumonia. Electronically Signed   By: David  [place] M.D.   On: 07/06/2017 07:57   Ct Head Code Stroke Wo Contrast  Result Date: 07/05/2017 CLINICAL DATA:  Code stroke. Right hemiparesis and difficulty speaking. EXAM: CT HEAD WITHOUT CONTRAST TECHNIQUE: Contiguous axial images were obtained from the base of the skull through the vertex without intravenous contrast. COMPARISON:  Brain MRI 04/27/2016 FINDINGS: Brain: There is loss of gray-white differentiation consistent with acute infarction involving the posterior left insula and left parietal lobe extending towards the vertex. No acute intracranial hemorrhage, mass, midline shift, or extra-axial fluid collection is identified. Chronic lacunar infarcts are present in the basal ganglia and thalami bilaterally. There is a chronic right temporoparietal infarct. There is  a small chronic left cerebellar infarct. Patchy cerebral white matter hypodensities are nonspecific but compatible with chronic small vessel ischemic disease, moderately advanced for age. Mild cerebral atrophy is advanced for age. Vascular: Calcified atherosclerosis at the skull base. No hyperdense vessel. Skull: No fracture or focal osseous lesion. Sinuses/Orbits: Visualized paranasal sinuses and mastoid air cells are clear. Orbits are unremarkable. Other: None. ASPECTS [facility] Stroke Program Early CT Score) - Ganglionic level infarction (caudate, lentiform nuclei, internal capsule, insula, M1-M3 cortex): 5-6 - Supraganglionic infarction (M4-M6 cortex): 2 Total score (0-10 with 10 being normal): 7-8 IMPRESSION: 1. Acute left MCA infarct involving the insula and parietal lobe. No hemorrhage. 2. ASPECTS is 7-8. 3. Age advanced chronic ischemia. These results were communicated to Dr. Amada Jupiter at 9:54 pm on 07/05/2017 by text page via the [facility] messaging system. Electronically Signed   By: Sebastian Ache M.D.   On: 07/05/2017 21:59   TTE pending - Left ventricle: Wall thickness was increased in a pattern of mild   LVH. Systolic function was mildly to moderately reduced. The   estimated ejection fraction was in the range of 40% to 45%.   Akinesis of the apicalanteroseptal, anterolateral, and apical   myocardium. Doppler parameters are consistent with abnormal left   ventricular relaxation (grade 1 diastolic dysfunction). - Aortic valve: There was mild regurgitation.   PHYSICAL EXAM  Temp:  [97.5 F (36.4 C)-98.4 F (36.9 C)] 97.5 F (36.4 C) (04/23 0800) Pulse Rate:  [29-149] 96 (04/23 1115) Resp:  [14-25] 21 (04/23 1115) BP: (91-150)/(61-114) 92/80 (04/23 1115) SpO2:  [80 %-100 %] 100 % (04/23 1100) Weight:  [196 lb 13.9 oz (89.3 kg)] 196 lb 13.9 oz (89.3 kg) (04/23 0700)  General - Well nourished, well developed, in no apparent distress.  Ophthalmologic - fundi not visualized due to  noncooperation.  Cardiovascular - Regular rate and rhythm with no murmur.  Neuro -  awake alert, global aphasia, able to say yes and no only, able to follow commands on eye closing and opening, did not follow any other commands.  Left gaze preference, able to cross midline and attending to right side, bilateral gaze incomplete.  Blinking to visual threat on the left, however not blinking to the right.  No significant right-sided neglect however.  PERRL, right facial droop.  Tongue midline inside mouth.  Right UE flaccid on pain stimulation. RLE 2/5 withdrawal on pain summation. LUE and LLE purposeful movement and against gravity.  DTR 1+ and no Babinski. Sensation, coordination not cooperative and gait not tested.   ASSESSMENT/PLAN [name] is a 57 y.o. male with history of CAD/MI s/p stenting and CABG, cardiomyopathy with EF 35-40%, hypertension, hyperlipidemia, cognitive impairment, obesity, seizure, OSA, ESRD on hemodialysis admitted for AMS and right-sided weakness. No tPA given due to outside window.    Stroke:  left MCA infarct, likely due to progressive large vessel atherosclerosis.  Chronic intracranial stenosis including bilateral MCAs since 2015.   Resultant global aphasia, right hemiplegia  CT left MCA infarct  CTA head and neck left CCA 50% stenosis left M2 cutoff and diffuse intracranial stenosis  IR s/p IA Integrilin and mechanical thrombolysis with microguidewire  MRI left MCA infarct   MRA left M2 occulsion, diffuse athero including left MCA left P2 and b/l ACAs  2D Echo EF 40-45%  LDL 131  HgbA1c 4.6  SCDs for VTE prophylaxis Fall precautions  DIET - DYS 1 Room service appropriate? Yes; Fluid consistency: Thin   aspirin 81 mg daily and clopidogrel 75 mg daily prior to admission, now on ASA 325mg  clopidogrel 75 mg daily. Continue ASA 325 and plavix for 3 months and then plavix alone given intracranial stenosis  Patient counseled to be compliant with his  antithrombotic medications  Ongoing aggressive stroke risk factor management  Therapy recommendations: CIR  Disposition: Pending  History of stroke  12/2013, admitted for hypotension after HD, MRI showed left caudate head acute infarct, chronic bilateral BG and pontine infarcts.  MRA severe ICAD involving bilateral MCAs.  Carotid Doppler negative.  LDL 89 and A1c 5.6.  Discharged with dual antiplatelet and Crestor 40  Follows with Dr. Terrace Arabia at [facility]  CHF with cardiomyopathy  EF 35 to 40% in 02/2017  TTE EF 40-45% this time  Gentle hydration given hypotension on HD  ESRD on HD  Nephrology on board  HD scheduled MWF  On HD today  Cre 13.49->10.40  Gentle hydration given hypotension on HD  Hypertension / hypotension Stable BP goal 120-140 postprocedure  Off Cardene  Low BP on HD  Decreased p.o. clonidine to 0.1mg  bid and metoprolol to 25mg  bid yesterday but now DC due to continued hypotension  Encourage po intake.  Close monitoring  BP goal 120-150 given severe intracranial stenosis  Hyperlipidemia  Home meds: Lipitor 40  LDL 131, goal < 70  Now on Lipitor 80  Continue statin at discharge  Other Stroke Risk Factors  CAD/MI - s/p stenting and CABG  Obstructive sleep apnea, on CPAP at home  Other Active Problems  Cognitive impairment - following with Dr. Terrace Arabia at [facility]  ?? Seizure, not on meds  Hospital day # 2  This patient is critically ill due to left MCA stroke, ESRD on HD, hypotension, CHF, severe intracranial stenosis and at significant risk of neurological worsening, death form recurrent stroke, hemorrhagic conversion, seizure, renal failure, heart failure. This patient's care requires constant monitoring of vital  signs, hemodynamics, respiratory and cardiac monitoring, review of multiple databases, neurological assessment, discussion with family, other specialists and medical decision making of high complexity. I spent 40 minutes of neurocritical  care time in the care of this patient.  Marvel Plan, MD PhD Stroke Neurology 07/07/2017 11:29 AM    To contact Stroke Continuity provider, please refer to [email or web address]. After hours, contact General Neurology

## 2017-07-07 NOTE — Progress Notes (Signed)
Initial Nutrition Assessment  DOCUMENTATION CODES:   Not applicable  INTERVENTION:   Nepro Shake po BID, each supplement provides 425 kcal and 19 grams protein  Recommend Rena-vit daily   NUTRITION DIAGNOSIS:   Inadequate oral intake related to dysphagia as evidenced by meal completion < 50%.  GOAL:   Patient will meet greater than or equal to 90% of their needs  MONITOR:   PO intake, Supplement acceptance  REASON FOR ASSESSMENT:   Malnutrition Screening Tool    ASSESSMENT:   Pt with PMH of ESRD on HD MWF, CHF, HTN, HLD, COPD, and CAD s/p CABG who was admitted with L MCA infarct per MD due to progressive large vessel atherosclerosis s/p IR revascularization 4/22.    Pt seen for positive MST, per chart review pt's weight has appeared to decrease since the end of 2018 but unable to confirm weight loss. Per Renal notes this admission EDW 89 kg(195 lb) PTA. Per last admission 12/18 EDW 100 kg. During this admission it was noted that pt was non-compliant with HD sessions by cutting them short. EDW has decreased by 11 kg since end of 2018 (11% weight loss x 5 months). It would appear that pt has muscle depletion as well but unsure if EDW decreased due to better compliance or true weight loss without any input from pt or family.   Pt discussed during ICU rounds and with RN.  Per RN pt is pocketing solids and does better with liquids  Pt with R hemiplegia and aphasia.  Diet adjusted to Dysphagia 1 with thin liquids Pt states yes to all questions and gives thumbs up on L side Will try nepro and adjust based on preference  Medications reviewed Labs reviewed: K+ 3.4 (L), PO4 4.5 WNL   EDW 89 kg Admission weight: 86.8 kg 2/19: 94.3 kg 1/19: 99.3 kg 8/18: 102.5 kg    NUTRITION - FOCUSED PHYSICAL EXAM: Mobility status unknown No movement in R side Noted mild-moderate depletion of muscle mass    Most Recent Value  Orbital Region  No depletion  Upper Arm Region  Mild  depletion [RUE]  Thoracic and Lumbar Region  No depletion  Buccal Region  No depletion  Temple Region  Moderate depletion  Clavicle Bone Region  Mild depletion  Clavicle and Acromion Bone Region  Mild depletion  Scapular Bone Region  Unable to assess  Dorsal Hand  Mild depletion [R hand]  Patellar Region  Mild depletion  Anterior Thigh Region  Moderate depletion  Posterior Calf Region  Mild depletion  Edema (RD Assessment)  None  Hair  Reviewed  Eyes  Reviewed  Mouth  Reviewed  Skin  Reviewed  Nails  Reviewed       Diet Order:  Fall precautions DIET - DYS 1 Room service appropriate? Yes; Fluid consistency: Thin  EDUCATION NEEDS:   No education needs have been identified at this time  Skin:  Skin Assessment: Reviewed RN Assessment  Last BM:  unknown  Height:   Ht Readings from Last 1 Encounters:  07/06/17 5\' 11"  (1.803 m)    Weight:   Wt Readings from Last 1 Encounters:  07/07/17 196 lb 13.9 oz (89.3 kg)    Ideal Body Weight:  78.1 kg  BMI:  Body mass index is 27.46 kg/m.  Estimated Nutritional Needs:   Kcal:  2200-2500  Protein:  106-125 grams  Fluid:  1.2 L/day  Kendell Bane RD, LDN, CNSC [phone] Pager [phone] After Hours Pager

## 2017-07-07 NOTE — Progress Notes (Signed)
  Speech Language Pathology Treatment: Dysphagia;Cognitive-Linquistic  Patient Details Name: [name] MRN: [id number] DOB: 25-Feb-1961 Today's Date: 07/07/2017 Time: 0950-1009 SLP Time Calculation (min) (ACUTE ONLY): 19 min  Assessment / Plan / Recommendation Clinical Impression  Pt seen following decline in function yesterday, less responsive, no acute change on CT. Pt was reportedly holding liquids and solids, unable to recognize use of straw. Today pt having HD in the room, BP soft, unable to sustain fully upright posture. Pt able to take sips from straw of thin liquids in reclined position without signs of aspiration. Prolonged mastication, lack of posterior propulsion of solid boluses consistently observed. Liquid wash and max verbal/visual cues occasionally beneficial but boluses also removed at end of session. Recommend downgrade to puree/thin, meds crushed. Posterior tilt may have been helpful.  Pt still globally aphasic, only follows commands with max visual and tactile cues. Occasionally responsive with spontaneous social speech - Hi good morning!. Nods yes to all questions. Will continue efforts.   HPI HPI: 57 yo code stroke from home via [facility] to [facility] ED with new R weakness, aphasia and neglect. Head CT: Hyperdensity of the left basal ganglia is favored to represent      SLP Plan  Continue with current plan of care       Recommendations  Diet recommendations: Dysphagia 1 (puree);Thin liquid Liquids provided via: Straw Medication Administration: Crushed with puree Compensations: Slow rate;Small sips/bites;Lingual sweep for clearance of pocketing;Minimize environmental distractions;Follow solids with liquid                Oral Care Recommendations: Oral care BID Follow up Recommendations: Inpatient Rehab Plan: Continue with current plan of care       GO               [facility], [place] CCC-SLP [phone]  [name] 07/07/2017, 10:20 AM

## 2017-07-07 NOTE — Progress Notes (Signed)
Spoke with Neuro MD, orders to keep plavix as is for administration tomorrow AM. Patient more lethargic, not communicating at all, significant facial droop and pocketing of oral fluids. Stat CT now and will keep patient NPO for tonight. Will continue to monitor and update patient status.

## 2017-07-07 NOTE — Progress Notes (Signed)
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 Patient laying in bed receiving dialysis. Still globally aphasic, but able to nod yes or no. No change in motor function- still no spontaneous movement of right upper extremity, right lower extremity withdraws from pain. Still demonstrates left gaze preference and right facial droop. Right groin incision c/d/i.  CT head 07/06/2017: 1. Decreased density of hyperdense material at the left basal ganglia, consistent with clearing of contrast staining. No new abnormality.  MRI/MRA brain/head 07/06/2017: 1. Acute left MCA territory cortical infarct without detected progression when compared to CTP from yesterday. No hemorrhagic conversion. 2. Continued non visualization of the left MCA inferior division. 3. There is advanced intracranial atherosclerosis with nonvisualized ACAs and severe left P2 segment stenosis. 4. Remote small vessel infarcts as described.  Allergies: Shrimp [shellfish allergy]; Atorvastatin; Insulins; Simvastatin; and Ultram [tramadol]  Medications: Prior to Admission medications   Medication Sig Start Date End Date Taking? Authorizing Provider  amLODipine (NORVASC) 5 MG tablet TAKE ONE TABLET BY MOUTH ONE TIME DAILY  12/08/16   Runell Gess, MD  aspirin EC 81 MG EC tablet Take 1 tablet (81 mg total) by mouth daily. 07/07/11   Dwana Melena, PA-C  atorvastatin (LIPITOR) 40 MG tablet Take 1 tablet (40 mg total) by mouth daily. 05/12/17 08/10/17  Corwin Levins, MD  cinacalcet (SENSIPAR) 90 MG tablet Take 90 mg by mouth daily with supper. 06/26/17   [provider]  cloNIDine (CATAPRES) 0.2 MG tablet Take 1 tablet (0.2 mg total) by mouth 2  (two) times daily. 03/19/17   Jodelle Gross, NP  clopidogrel (PLAVIX) 75 MG tablet Take 1 tablet (75 mg total) by mouth daily. 03/19/17   Jodelle Gross, NP  doxercalciferol (HECTOROL) 4 MCG/2ML injection Inject 2.5 mLs (5 mcg total) into the vein every Monday, Wednesday, and Friday with hemodialysis. 03/06/17   Lonia Blood, MD  febuxostat (ULORIC) 40 MG tablet Take 40 mg by mouth daily as needed (gout).  05/13/11   Corwin Levins, MD  ferric gluconate 125 mg in sodium chloride 0.9 % 100 mL Inject 125 mg into the vein every Monday, Wednesday, and Friday with hemodialysis. 03/06/17   Lonia Blood, MD  ketoconazole (NIZORAL) 2 % cream Apply 1 application topically daily. 12/18/16   Vivi Barrack, DPM  lanthanum ([name]) 1000 MG chewable tablet Chew 2 tablets (2,000 mg total) by mouth 3 (three) times daily with meals. Yearly physical due in July must see MD for refills 09/10/15   Corwin Levins, MD  lidocaine-prilocaine (EMLA) cream Apply 1 application topically as needed. Apply as needed for dialysis site    [provider]  memantine (NAMENDA) 10 MG tablet Take 1 tablet (10 mg total) by mouth 2 (two) times daily. 05/12/17   Corwin Levins, MD  metoprolol tartrate (LOPRESSOR) 50 MG tablet Take 1 tablet (50 mg total) by mouth 2 (two) times daily. 05/12/17   Corwin Levins, MD  nitroGLYCERIN (NITROSTAT) 0.4 MG SL tablet PLACE 1 TABLET UNDER TONGUE AS NEEDED FOR CHEST PAIN EVERY 5 MINUTES (MAX 3 DOSES) 05/12/17   Corwin Levins, MD  terazosin (HYTRIN) 10 MG capsule TAKE 1 CAPSULE BY MOUTH DAILY AT BEDTIME 05/12/17   Corwin Levins, MD  zolpidem (AMBIEN) 10 MG tablet Take 1 tablet (10 mg total) by mouth at bedtime as needed. for sleep 05/12/17   Corwin Levins, MD     Vital Signs: BP 115/79   Pulse 88   Temp (!) 97.5 F (36.4 C) (Oral)   Resp (!) 24   Ht 5\' 11"  (1.803 m)   Wt 196 lb 13.9 oz (89.3 kg)   SpO2 (!) 81%   BMI 27.46 kg/m   Physical Exam  Constitutional: He  appears well-developed and well-nourished. No distress.  Cardiovascular: Normal rate, regular rhythm and normal heart sounds.  No murmur heard. Pulmonary/Chest: Effort normal and breath sounds normal. He has no wheezes.  Neurological:  Alert and awake, demonstrates global aphasia but able to nod yes or no. PERRL bilaterally. Demonstrates left gaze preference. Visual fields not assessed. Right facial droop. Tongue midline. No spontaneous movement of right upper extremity, right lower extremity withdraws from pain, left upper and lower extremities demonstrate spontaneous movement. Pronator drift not assessed. Fine motor and coordination not assessed. Gait not assessed. Romberg not assessed. Heel to toe not assessed. Distal pulses 2+ bilaterally.   Skin: Skin is warm and dry.  Right groin incision soft without hematoma or active bleeding.  Psychiatric: He has a normal mood and affect. His behavior is normal. Judgment and thought content normal.  Nursing note and vitals reviewed.   Imaging: Ct Angio Head W Or Wo Contrast  Result Date: 07/05/2017 CLINICAL DATA:  Right hemiparesis and difficulty speaking. EXAM: CT ANGIOGRAPHY HEAD AND NECK CT PERFUSION BRAIN TECHNIQUE: Multidetector CT imaging of the head and neck was performed using the standard protocol during bolus administration of intravenous contrast. Multiplanar CT image reconstructions and MIPs were obtained to evaluate the vascular anatomy. Carotid stenosis measurements (when applicable) are obtained utilizing NASCET criteria, using the distal internal carotid diameter as the denominator. Multiphase CT imaging of the brain was performed following IV bolus contrast injection. Subsequent parametric perfusion maps were calculated using RAPID software. CONTRAST:  ISOVUE-370 IOPAMIDOL (ISOVUE-370) INJECTION 76% COMPARISON:  Head MRA 12/29/2013.  Neck MRA 06/30/2005 FINDINGS: CTA NECK FINDINGS Aortic arch: Standard 3 vessel aortic arch.  Widely patent arch vessel origins. Right carotid system: Patent with predominantly soft plaque in the common carotid artery resulting in less than 50% narrowing. Minimal calcified plaque at the carotid bifurcation. No ICA stenosis. Left carotid system: Patent with predominantly soft plaque in the distal common carotid artery resulting in 50% stenosis. Moderate calcified plaque about the carotid bifurcation. No ICA stenosis. Vertebral arteries: Patent with the left being slightly dominant. Mild proximal right V2 stenosis due to calcified plaque. Calcified and soft plaque in the left V1 segment with mild multifocal stenoses. Skeleton: Mild cervical disc degeneration. Other neck: Multiple thyroid nodules including a 2.9 cm exophytic. Nodule extending inferiorly from the isthmus. Upper chest: Clear lung apices. Review of the MIP images confirms the above findings CTA HEAD FINDINGS Anterior circulation: The internal carotid arteries are patent from skull base to carotid termini with calcified plaque resulting in mild cavernous and supraclinoid stenosis on the right. The M1 segments are patent with mild stenosis on the right. The left M2 inferior division is occluded at its origin with some reconstitution of more distal M2 and M3 branches, however there are numerous missing distal branches in the left parietal region corresponding to the acute infarct. The prior MRA also suggested a severe stenosis or possibly short segment occlusion of the left M2 inferior division, however motion artifact on  that study makes it difficult to make a detailed assessment of interval changes. There are also likely some missing distal left MCA superior division branch vessels. There also severe proximal right M2 stenoses which are at least partially chronic. The right A1 segment is occluded. The left A1 segment is patent but demonstrates a severe proximal stenosis. Both A2 segments are heavily diseased with multifocal severe stenoses and or  short segment occlusions. The appearance of the ACAs is grossly similar to the prior motion degraded MRA. No aneurysm. Posterior circulation: The intracranial vertebral arteries are patent to the basilar. There is right greater than left V4 segment calcified plaque without significant stenosis. Patent SCA origins are identified bilaterally. PICA and AICA origins are not well evaluated. The basilar artery is widely patent. Posterior communicating arteries are diminutive or absent. PCAs are patent with mild left P2 stenosis. No aneurysm. Venous sinuses: Inadequately assessed due to arterial phase contrast timing. Anatomic variants: None. Delayed phase: Not performed. Review of the MIP images confirms the above findings CT Brain Perfusion Findings: CBF (<30%) Volume: 13mL Perfusion (Tmax>6.0s) volume: , however a substantial amount of this is located in the right cerebral hemisphere Mismatch Volume: though with the caveat that this is artifactually inflated due to (likely chronic) prolonged perfusion in the right cerebral hemisphere. There is still however an apparent sizable penumbra in the posterior left cerebral hemisphere. Infarction Location:Predominantly left parietal lobe with a small amount of posterior left frontal lobe, posterior left temporal lobe, and posterior insula involvement. IMPRESSION: 1. 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 2. Left M2 inferior division occlusion at its origin with some distal reconstitution. Severe stenosis (or possibly short segment occlusion) in this location on a prior MRA. Numerous missing distal branch vessels in the left parietal region corresponding to the acute infarct. 3. Advanced intracranial atherosclerosis with numerous severe stenoses as above. 4. 50% left common carotid artery stenosis. 5.  Aortic Atherosclerosis (ICD10-I70.0). 6. Thyroid nodules measuring up to 2.9 cm. Nonemergent thyroid ultrasound recommended for further evaluation.  These results were communicated to Dr. Amada Jupiter at 10:18 pm on 07/05/2017 by text page via the [facility] messaging system. Electronically Signed   By: Sebastian Ache M.D.   On: 07/05/2017 22:47   Ct Head Wo Contrast  Result Date: 07/06/2017 CLINICAL DATA:  Stroke follow-up EXAM: CT HEAD WITHOUT CONTRAST TECHNIQUE: Contiguous axial images were obtained from the base of the skull through the vertex without intravenous contrast. COMPARISON:  Brain MRI 07/06/2017 Head CT 07/06/2017 at 5:35 a.m. FINDINGS: Brain: Decreased density of material of the left basal ganglia, consistent with clearing of contrast staining. Right temporal encephalomalacia is unchanged. No acute hemorrhage or mass effect. No hydrocephalus. Vascular: Small amount of residual contrast material within the vessels. Skull: Normal visualized skull base, calvarium and extracranial soft tissues. Sinuses/Orbits: No sinus fluid levels or advanced mucosal thickening. No mastoid effusion. Normal orbits. IMPRESSION: Decreased density of hyperdense material at the left basal ganglia, consistent with clearing of contrast staining. No new abnormality. Electronically Signed   By: Deatra Robinson M.D.   On: 07/06/2017 23:24   Ct Head Wo Contrast  Result Date: 07/06/2017 CLINICAL DATA:  Left MCA infarct status post intervention EXAM: CT HEAD WITHOUT CONTRAST TECHNIQUE: Contiguous axial images were obtained from the base of the skull through the vertex without intravenous contrast. COMPARISON:  Cerebral angiogram 07/05/2017 CTA head neck 07/05/2017 FINDINGS: Brain: There is hyperdensity within the left basal ganglia, likely indicating contrast staining. No midline shift or  mass effect. No hydrocephalus. Unchanged right temporal lobe encephalomalacia. Vascular: There is intravascular contrast material related to prior an angiographic study. Skull: Normal visualized skull base, calvarium and extracranial soft tissues. Sinuses/Orbits: No sinus fluid levels or advanced  mucosal thickening. No mastoid effusion. Normal orbits. IMPRESSION: Hyperdensity of the left basal ganglia is favored to represent contrast staining over petechial hemorrhage. Electronically Signed   By: Deatra Robinson M.D.   On: 07/06/2017 05:56   Ct Angio Neck W Or Wo Contrast  Result Date: 07/05/2017 CLINICAL DATA:  Right hemiparesis and difficulty speaking. EXAM: CT ANGIOGRAPHY HEAD AND NECK CT PERFUSION BRAIN TECHNIQUE: Multidetector CT imaging of the head and neck was performed using the standard protocol during bolus administration of intravenous contrast. Multiplanar CT image reconstructions and MIPs were obtained to evaluate the vascular anatomy. Carotid stenosis measurements (when applicable) are obtained utilizing NASCET criteria, using the distal internal carotid diameter as the denominator. Multiphase CT imaging of the brain was performed following IV bolus contrast injection. Subsequent parametric perfusion maps were calculated using RAPID software. CONTRAST:  ISOVUE-370 IOPAMIDOL (ISOVUE-370) INJECTION 76% COMPARISON:  Head MRA 12/29/2013.  Neck MRA 06/30/2005 FINDINGS: CTA NECK FINDINGS Aortic arch: Standard 3 vessel aortic arch. Widely patent arch vessel origins. Right carotid system: Patent with predominantly soft plaque in the common carotid artery resulting in less than 50% narrowing. Minimal calcified plaque at the carotid bifurcation. No ICA stenosis. Left carotid system: Patent with predominantly soft plaque in the distal common carotid artery resulting in 50% stenosis. Moderate calcified plaque about the carotid bifurcation. No ICA stenosis. Vertebral arteries: Patent with the left being slightly dominant. Mild proximal right V2 stenosis due to calcified plaque. Calcified and soft plaque in the left V1 segment with mild multifocal stenoses. Skeleton: Mild cervical disc degeneration. Other neck: Multiple thyroid nodules including a 2.9 cm exophytic. Nodule extending inferiorly from  the isthmus. Upper chest: Clear lung apices. Review of the MIP images confirms the above findings CTA HEAD FINDINGS Anterior circulation: The internal carotid arteries are patent from skull base to carotid termini with calcified plaque resulting in mild cavernous and supraclinoid stenosis on the right. The M1 segments are patent with mild stenosis on the right. The left M2 inferior division is occluded at its origin with some reconstitution of more distal M2 and M3 branches, however there are numerous missing distal branches in the left parietal region corresponding to the acute infarct. The prior MRA also suggested a severe stenosis or possibly short segment occlusion of the left M2 inferior division, however motion artifact on that study makes it difficult to make a detailed assessment of interval changes. There are also likely some missing distal left MCA superior division branch vessels. There also severe proximal right M2 stenoses which are at least partially chronic. The right A1 segment is occluded. The left A1 segment is patent but demonstrates a severe proximal stenosis. Both A2 segments are heavily diseased with multifocal severe stenoses and or short segment occlusions. The appearance of the ACAs is grossly similar to the prior motion degraded MRA. No aneurysm. Posterior circulation: The intracranial vertebral arteries are patent to the basilar. There is right greater than left V4 segment calcified plaque without significant stenosis. Patent SCA origins are identified bilaterally. PICA and AICA origins are not well evaluated. The basilar artery is widely patent. Posterior communicating arteries are diminutive or absent. PCAs are patent with mild left P2 stenosis. No aneurysm. Venous sinuses: Inadequately assessed due to arterial phase contrast timing. Anatomic variants:  None. Delayed phase: Not performed. Review of the MIP images confirms the above findings CT Brain Perfusion Findings: CBF (<30%) Volume:  28mL Perfusion (Tmax>6.0s) volume: , however a substantial amount of this is located in the right cerebral hemisphere Mismatch Volume: though with the caveat that this is artifactually inflated due to (likely chronic) prolonged perfusion in the right cerebral hemisphere. There is still however an apparent sizable penumbra in the posterior left cerebral hemisphere. Infarction Location:Predominantly left parietal lobe with a small amount of posterior left frontal lobe, posterior left temporal lobe, and posterior insula involvement. IMPRESSION: 1. 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 2. Left M2 inferior division occlusion at its origin with some distal reconstitution. Severe stenosis (or possibly short segment occlusion) in this location on a prior MRA. Numerous missing distal branch vessels in the left parietal region corresponding to the acute infarct. 3. Advanced intracranial atherosclerosis with numerous severe stenoses as above. 4. 50% left common carotid artery stenosis. 5.  Aortic Atherosclerosis (ICD10-I70.0). 6. Thyroid nodules measuring up to 2.9 cm. Nonemergent thyroid ultrasound recommended for further evaluation. These results were communicated to Dr. Amada Jupiter at 10:18 pm on 07/05/2017 by text page via the [facility] messaging system. Electronically Signed   By: Sebastian Ache M.D.   On: 07/05/2017 22:47   Mr Maxine Glenn Head Wo Contrast  Result Date: 07/06/2017 CLINICAL DATA:  Stroke follow-up EXAM: MRI HEAD WITHOUT CONTRAST MRA HEAD WITHOUT CONTRAST TECHNIQUE: Multiplanar, multiecho pulse sequences of the brain and surrounding structures were obtained without intravenous contrast. Angiographic images of the head were obtained using MRA technique without contrast. COMPARISON:  CT, CTA, and CTP 07/05/2017.  Brain MRI 04/27/2016 FINDINGS: MRI HEAD FINDINGS Brain: Cortically based restricted diffusion in the posterior left insula, superior temporal lobe, parietal lobe, and minimally  involving the posterior left frontal lobe. There is good correlation with infarct by CT perfusion. Small remote infarcts in the left superior cerebellum, left paramedian pons, left basal ganglia/internal capsule, and right temporal parietal cortex. There is chronic blood products at the remote left basal ganglia infarct. No hemorrhage seen at the acute infarct. No hydrocephalus or masslike finding. Vascular: Arterial findings below. Normal dural venous sinus flow voids. Skull and upper cervical spine: Negative Sinuses/Orbits: Negative MRA HEAD FINDINGS Symmetric carotid and vertebral arteries. The vertebral and basilar arteries are smooth and diffusely patent. Atheromatous irregularity of bilateral posterior cerebral arteries with high-grade left P2 segment narrowing. Essentially nonvisualized bilateral ACA attributed to advanced atheromatous narrowing based on prior CTA. Less robust flow signal seen in the left MCA with continued nonvisualization of the inferior division. The left superior division and right MCA vasculature show diffuse atheromatous irregularity. IMPRESSION: 1. Acute left MCA territory cortical infarct without detected progression when compared to CTP from yesterday. No hemorrhagic conversion. 2. Continued non visualization of the left MCA inferior division. 3. There is advanced intracranial atherosclerosis with nonvisualized ACAs and severe left P2 segment stenosis. 4. Remote small vessel infarcts as described. Electronically Signed   By: Marnee Spring M.D.   On: 07/06/2017 12:59   Mr Brain Wo Contrast  Result Date: 07/06/2017 CLINICAL DATA:  Stroke follow-up EXAM: MRI HEAD WITHOUT CONTRAST MRA HEAD WITHOUT CONTRAST TECHNIQUE: Multiplanar, multiecho pulse sequences of the brain and surrounding structures were obtained without intravenous contrast. Angiographic images of the head were obtained using MRA technique without contrast. COMPARISON:  CT, CTA, and CTP 07/05/2017.  Brain MRI  04/27/2016 FINDINGS: MRI HEAD FINDINGS Brain: Cortically based restricted diffusion in the posterior left  insula, superior temporal lobe, parietal lobe, and minimally involving the posterior left frontal lobe. There is good correlation with infarct by CT perfusion. Small remote infarcts in the left superior cerebellum, left paramedian pons, left basal ganglia/internal capsule, and right temporal parietal cortex. There is chronic blood products at the remote left basal ganglia infarct. No hemorrhage seen at the acute infarct. No hydrocephalus or masslike finding. Vascular: Arterial findings below. Normal dural venous sinus flow voids. Skull and upper cervical spine: Negative Sinuses/Orbits: Negative MRA HEAD FINDINGS Symmetric carotid and vertebral arteries. The vertebral and basilar arteries are smooth and diffusely patent. Atheromatous irregularity of bilateral posterior cerebral arteries with high-grade left P2 segment narrowing. Essentially nonvisualized bilateral ACA attributed to advanced atheromatous narrowing based on prior CTA. Less robust flow signal seen in the left MCA with continued nonvisualization of the inferior division. The left superior division and right MCA vasculature show diffuse atheromatous irregularity. IMPRESSION: 1. Acute left MCA territory cortical infarct without detected progression when compared to CTP from yesterday. No hemorrhagic conversion. 2. Continued non visualization of the left MCA inferior division. 3. There is advanced intracranial atherosclerosis with nonvisualized ACAs and severe left P2 segment stenosis. 4. Remote small vessel infarcts as described. Electronically Signed   By: Marnee Spring M.D.   On: 07/06/2017 12:59   Ct Cerebral Perfusion W Contrast  Result Date: 07/05/2017 CLINICAL DATA:  Right hemiparesis and difficulty speaking. EXAM: CT ANGIOGRAPHY HEAD AND NECK CT PERFUSION BRAIN TECHNIQUE: Multidetector CT imaging of the head and neck was performed using  the standard protocol during bolus administration of intravenous contrast. Multiplanar CT image reconstructions and MIPs were obtained to evaluate the vascular anatomy. Carotid stenosis measurements (when applicable) are obtained utilizing NASCET criteria, using the distal internal carotid diameter as the denominator. Multiphase CT imaging of the brain was performed following IV bolus contrast injection. Subsequent parametric perfusion maps were calculated using RAPID software. CONTRAST:  ISOVUE-370 IOPAMIDOL (ISOVUE-370) INJECTION 76% COMPARISON:  Head MRA 12/29/2013.  Neck MRA 06/30/2005 FINDINGS: CTA NECK FINDINGS Aortic arch: Standard 3 vessel aortic arch. Widely patent arch vessel origins. Right carotid system: Patent with predominantly soft plaque in the common carotid artery resulting in less than 50% narrowing. Minimal calcified plaque at the carotid bifurcation. No ICA stenosis. Left carotid system: Patent with predominantly soft plaque in the distal common carotid artery resulting in 50% stenosis. Moderate calcified plaque about the carotid bifurcation. No ICA stenosis. Vertebral arteries: Patent with the left being slightly dominant. Mild proximal right V2 stenosis due to calcified plaque. Calcified and soft plaque in the left V1 segment with mild multifocal stenoses. Skeleton: Mild cervical disc degeneration. Other neck: Multiple thyroid nodules including a 2.9 cm exophytic. Nodule extending inferiorly from the isthmus. Upper chest: Clear lung apices. Review of the MIP images confirms the above findings CTA HEAD FINDINGS Anterior circulation: The internal carotid arteries are patent from skull base to carotid termini with calcified plaque resulting in mild cavernous and supraclinoid stenosis on the right. The M1 segments are patent with mild stenosis on the right. The left M2 inferior division is occluded at its origin with some reconstitution of more distal M2 and M3 branches, however there are  numerous missing distal branches in the left parietal region corresponding to the acute infarct. The prior MRA also suggested a severe stenosis or possibly short segment occlusion of the left M2 inferior division, however motion artifact on that study makes it difficult to make a detailed assessment of interval changes. There are  also likely some missing distal left MCA superior division branch vessels. There also severe proximal right M2 stenoses which are at least partially chronic. The right A1 segment is occluded. The left A1 segment is patent but demonstrates a severe proximal stenosis. Both A2 segments are heavily diseased with multifocal severe stenoses and or short segment occlusions. The appearance of the ACAs is grossly similar to the prior motion degraded MRA. No aneurysm. Posterior circulation: The intracranial vertebral arteries are patent to the basilar. There is right greater than left V4 segment calcified plaque without significant stenosis. Patent SCA origins are identified bilaterally. PICA and AICA origins are not well evaluated. The basilar artery is widely patent. Posterior communicating arteries are diminutive or absent. PCAs are patent with mild left P2 stenosis. No aneurysm. Venous sinuses: Inadequately assessed due to arterial phase contrast timing. Anatomic variants: None. Delayed phase: Not performed. Review of the MIP images confirms the above findings CT Brain Perfusion Findings: CBF (<30%) Volume: 28mL Perfusion (Tmax>6.0s) volume: , however a substantial amount of this is located in the right cerebral hemisphere Mismatch Volume: though with the caveat that this is artifactually inflated due to (likely chronic) prolonged perfusion in the right cerebral hemisphere. There is still however an apparent sizable penumbra in the posterior left cerebral hemisphere. Infarction Location:Predominantly left parietal lobe with a small amount of posterior left frontal lobe, posterior left  temporal lobe, and posterior insula involvement. IMPRESSION: 1. 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 2. Left M2 inferior division occlusion at its origin with some distal reconstitution. Severe stenosis (or possibly short segment occlusion) in this location on a prior MRA. Numerous missing distal branch vessels in the left parietal region corresponding to the acute infarct. 3. Advanced intracranial atherosclerosis with numerous severe stenoses as above. 4. 50% left common carotid artery stenosis. 5.  Aortic Atherosclerosis (ICD10-I70.0). 6. Thyroid nodules measuring up to 2.9 cm. Nonemergent thyroid ultrasound recommended for further evaluation. These results were communicated to Dr. Amada Jupiter at 10:18 pm on 07/05/2017 by text page via the [facility] messaging system. Electronically Signed   By: Sebastian Ache M.D.   On: 07/05/2017 22:47   Dg Chest Port 1 View  Result Date: 07/06/2017 CLINICAL DATA:  Cardiomyopathy, coronary artery disease with stent placement, chronic renal insufficiency. EXAM: PORTABLE CHEST 1 VIEW COMPARISON:  Chest x-ray of March 04, 2017 FINDINGS: The lungs are borderline hypoinflated but clear. The heart is top-normal in size. The pulmonary vascularity is normal. There is no pleural effusion. There are post CABG changes. There is a vascular graft in the left axillary region. The bony thorax exhibits no acute abnormality. IMPRESSION: Top-normal cardiac size without pulmonary edema or pleural effusion. No pneumonia. Electronically Signed   By: David  [place] M.D.   On: 07/06/2017 07:57   Ct Head Code Stroke Wo Contrast  Result Date: 07/05/2017 CLINICAL DATA:  Code stroke. Right hemiparesis and difficulty speaking. EXAM: CT HEAD WITHOUT CONTRAST TECHNIQUE: Contiguous axial images were obtained from the base of the skull through the vertex without intravenous contrast. COMPARISON:  Brain MRI 04/27/2016 FINDINGS: Brain: There is loss of gray-white differentiation  consistent with acute infarction involving the posterior left insula and left parietal lobe extending towards the vertex. No acute intracranial hemorrhage, mass, midline shift, or extra-axial fluid collection is identified. Chronic lacunar infarcts are present in the basal ganglia and thalami bilaterally. There is a chronic right temporoparietal infarct. There is a small chronic left cerebellar infarct. Patchy cerebral white matter hypodensities are nonspecific  but compatible with chronic small vessel ischemic disease, moderately advanced for age. Mild cerebral atrophy is advanced for age. Vascular: Calcified atherosclerosis at the skull base. No hyperdense vessel. Skull: No fracture or focal osseous lesion. Sinuses/Orbits: Visualized paranasal sinuses and mastoid air cells are clear. Orbits are unremarkable. Other: None. ASPECTS [facility] Stroke Program Early CT Score) - Ganglionic level infarction (caudate, lentiform nuclei, internal capsule, insula, M1-M3 cortex): 5-6 - Supraganglionic infarction (M4-M6 cortex): 2 Total score (0-10 with 10 being normal): 7-8 IMPRESSION: 1. Acute left MCA infarct involving the insula and parietal lobe. No hemorrhage. 2. ASPECTS is 7-8. 3. Age advanced chronic ischemia. These results were communicated to Dr. Amada Jupiter at 9:54 pm on 07/05/2017 by text page via the [facility] messaging system. Electronically Signed   By: Sebastian Ache M.D.   On: 07/05/2017 21:59    Labs:  CBC: Recent Labs    03/06/17 0727 07/05/17 2215 07/05/17 2232 07/06/17 0510 07/07/17 0806  WBC 6.1 14.8*  --  12.0* 7.5  HGB 6.9* 13.2 12.6* 11.4* 11.7*  HCT 20.8* 38.9* 37.0* 32.9* 34.9*  PLT 160 191  --  197 187    COAGS: Recent Labs    03/04/17 2112 03/05/17 0106 03/05/17 0619 07/05/17 2215  INR 1.48  --   --  1.16  APTT 45* 59* 64* 22*    BMP: Recent Labs    03/06/17 0727 07/05/17 2215 07/05/17 2232 07/06/17 0510 07/07/17 0804  NA 134* 136 133* 133* 135  K 4.0 4.0 4.8 3.5 3.4*    CL 96* 94* 98* 101 100*  CO2 27 20*  --  18* 21*  GLUCOSE 98 135* 130* 106* 89  BUN 20 39* 48* 35* 26*  CALCIUM 7.9* 10.7*  --  9.6 9.2  CREATININE 10.06* 14.46* 14.80* 13.49* 10.40*  GFRNONAA 5* 3*  --  4* 5*  GFRAA 6* 4*  --  4* 6*    LIVER FUNCTION TESTS: Recent Labs    11/04/16 1151 03/05/17 0106 03/06/17 0727 03/19/17 1152 07/05/17 2215 07/07/17 0804  BILITOT 0.6 0.9  --  0.6 1.0  --   AST 13 39  --  16 15  --   ALT 11 17  --  11 9*  --   ALKPHOS 63 44  --  50 40  --   PROT 7.4 6.2*  --  7.0 7.4  --   ALBUMIN 4.2 3.2* 3.1* 4.4 3.9 3.3*    Assessment and Plan:  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 Condition stable- still demonstrates global aphasia, left gaze preference, right facial droop, no spontaneous movement of right upper extremity, and right lower extremity withdraws from pain. Right groin incision stable. Continue taking Plavix 75 mg once daily and Aspirin 325 mg daily. Appreciate and agree with neurology management.  Electronically Signed: Elwin Mocha, PA-C 07/07/2017, 9:42 AM   I spent a total of 15 Minutes at the the patient's bedside AND on the patient's hospital floor or unit, greater than 50% of which was counseling/coordinating care for left MCA inferior division occlusion s/p revascularization.

## 2017-07-08 ENCOUNTER — Inpatient Hospital Stay (HOSPITAL_COMMUNITY): Payer: Medicare Other

## 2017-07-08 DIAGNOSIS — Z8673 Personal history of transient ischemic attack (TIA), and cerebral infarction without residual deficits: Secondary | ICD-10-CM

## 2017-07-08 DIAGNOSIS — I69391 Dysphagia following cerebral infarction: Secondary | ICD-10-CM

## 2017-07-08 DIAGNOSIS — I63512 Cerebral infarction due to unspecified occlusion or stenosis of left middle cerebral artery: Secondary | ICD-10-CM

## 2017-07-08 DIAGNOSIS — I255 Ischemic cardiomyopathy: Secondary | ICD-10-CM

## 2017-07-08 DIAGNOSIS — I10 Essential (primary) hypertension: Secondary | ICD-10-CM

## 2017-07-08 DIAGNOSIS — G4733 Obstructive sleep apnea (adult) (pediatric): Secondary | ICD-10-CM

## 2017-07-08 DIAGNOSIS — R000 Tachycardia, unspecified: Secondary | ICD-10-CM

## 2017-07-08 DIAGNOSIS — N186 End stage renal disease: Secondary | ICD-10-CM

## 2017-07-08 DIAGNOSIS — I63419 Cerebral infarction due to embolism of unspecified middle cerebral artery: Secondary | ICD-10-CM

## 2017-07-08 DIAGNOSIS — I5042 Chronic combined systolic (congestive) and diastolic (congestive) heart failure: Secondary | ICD-10-CM | POA: Diagnosis present

## 2017-07-08 DIAGNOSIS — Z992 Dependence on renal dialysis: Secondary | ICD-10-CM

## 2017-07-08 DIAGNOSIS — I5189 Other ill-defined heart diseases: Secondary | ICD-10-CM

## 2017-07-08 DIAGNOSIS — I6602 Occlusion and stenosis of left middle cerebral artery: Secondary | ICD-10-CM

## 2017-07-08 DIAGNOSIS — D638 Anemia in other chronic diseases classified elsewhere: Secondary | ICD-10-CM

## 2017-07-08 LAB — BASIC METABOLIC PANEL
Anion gap: 18 — ABNORMAL HIGH (ref 5–15)
BUN: 16 mg/dL (ref 6–20)
CALCIUM: 10.1 mg/dL (ref 8.9–10.3)
CO2: 21 mmol/L — ABNORMAL LOW (ref 22–32)
CREATININE: 9.17 mg/dL — AB (ref 0.61–1.24)
Chloride: 96 mmol/L — ABNORMAL LOW (ref 101–111)
GFR calc Af Amer: 6 mL/min — ABNORMAL LOW (ref >60)
GFR calc non Af Amer: 6 mL/min — ABNORMAL LOW (ref >60)
Glucose, Bld: 86 mg/dL (ref 65–99)
Potassium: 3.7 mmol/L (ref 3.5–5.1)
SODIUM: 135 mmol/L (ref 135–145)

## 2017-07-08 LAB — CBC WITH DIFFERENTIAL/PLATELET
BASOS PCT: 1 %
Basophils Absolute: 0 10*3/uL (ref 0.0–0.1)
EOS ABS: 0.2 10*3/uL (ref 0.0–0.7)
Eosinophils Relative: 3 %
HCT: 38.9 % — ABNORMAL LOW (ref 39.0–52.0)
HEMOGLOBIN: 13.1 g/dL (ref 13.0–17.0)
LYMPHS ABS: 1.1 10*3/uL (ref 0.7–4.0)
Lymphocytes Relative: 14 %
MCH: 31.3 pg (ref 26.0–34.0)
MCHC: 33.7 g/dL (ref 30.0–36.0)
MCV: 93.1 fL (ref 78.0–100.0)
MONO ABS: 0.4 10*3/uL (ref 0.1–1.0)
MONOS PCT: 6 %
NEUTROS PCT: 76 %
Neutro Abs: 5.7 10*3/uL (ref 1.7–7.7)
Platelets: 229 10*3/uL (ref 150–400)
RBC: 4.18 MIL/uL — ABNORMAL LOW (ref 4.22–5.81)
RDW: 14.6 % (ref 11.5–15.5)
WBC: 7.5 10*3/uL (ref 4.0–10.5)

## 2017-07-08 LAB — COMPREHENSIVE METABOLIC PANEL
ALK PHOS: 49 U/L (ref 38–126)
ALT: <5 U/L — ABNORMAL LOW (ref 17–63)
ANION GAP: 16 — AB (ref 5–15)
AST: 21 U/L (ref 15–41)
Albumin: 3.6 g/dL (ref 3.5–5.0)
BUN: 23 mg/dL — ABNORMAL HIGH (ref 6–20)
CO2: 25 mmol/L (ref 22–32)
Calcium: 10.4 mg/dL — ABNORMAL HIGH (ref 8.9–10.3)
Chloride: 95 mmol/L — ABNORMAL LOW (ref 101–111)
Creatinine, Ser: 11.2 mg/dL — ABNORMAL HIGH (ref 0.61–1.24)
GFR calc non Af Amer: 4 mL/min — ABNORMAL LOW (ref >60)
GFR, EST AFRICAN AMERICAN: 5 mL/min — AB (ref >60)
Glucose, Bld: 147 mg/dL — ABNORMAL HIGH (ref 65–99)
POTASSIUM: 3.4 mmol/L — AB (ref 3.5–5.1)
SODIUM: 136 mmol/L (ref 135–145)
TOTAL PROTEIN: 7 g/dL (ref 6.5–8.1)
Total Bilirubin: 0.9 mg/dL (ref 0.3–1.2)

## 2017-07-08 LAB — CBC
HCT: 39.7 % (ref 39.0–52.0)
Hemoglobin: 13.2 g/dL (ref 13.0–17.0)
MCH: 30.9 pg (ref 26.0–34.0)
MCHC: 33.2 g/dL (ref 30.0–36.0)
MCV: 93 fL (ref 78.0–100.0)
PLATELETS: 220 10*3/uL (ref 150–400)
RBC: 4.27 MIL/uL (ref 4.22–5.81)
RDW: 14.7 % (ref 11.5–15.5)
WBC: 8.5 10*3/uL (ref 4.0–10.5)

## 2017-07-08 LAB — TROPONIN I: Troponin I: 0.62 ng/mL (ref <0.03)

## 2017-07-08 LAB — GLUCOSE, CAPILLARY
GLUCOSE-CAPILLARY: 102 mg/dL — AB (ref 65–99)
Glucose-Capillary: 110 mg/dL — ABNORMAL HIGH (ref 65–99)
Glucose-Capillary: 149 mg/dL — ABNORMAL HIGH (ref 65–99)

## 2017-07-08 LAB — MAGNESIUM: Magnesium: 2.1 mg/dL (ref 1.7–2.4)

## 2017-07-08 MED ORDER — AMIODARONE HCL IN DEXTROSE 360-4.14 MG/200ML-% IV SOLN
INTRAVENOUS | Status: AC
  Filled 2017-07-08: qty 200

## 2017-07-08 MED ORDER — OXYCODONE HCL 5 MG PO TABS
10.0000 mg | ORAL_TABLET | Freq: Once | ORAL | Status: AC
  Administered 2017-07-08: 10 mg via ORAL
  Filled 2017-07-08: qty 2

## 2017-07-08 MED ORDER — METOPROLOL TARTRATE 5 MG/5ML IV SOLN
INTRAVENOUS | Status: AC
Start: 2017-07-08 — End: 2017-07-09
  Filled 2017-07-08: qty 5

## 2017-07-08 MED ORDER — CLONIDINE HCL 0.1 MG/24HR TD PTWK
0.1000 mg | MEDICATED_PATCH | TRANSDERMAL | Status: DC
Start: 2017-07-08 — End: 2017-07-09
  Administered 2017-07-08: 0.1 mg via TRANSDERMAL
  Filled 2017-07-08: qty 1

## 2017-07-08 MED ORDER — METOPROLOL TARTRATE 25 MG PO TABS
25.0000 mg | ORAL_TABLET | Freq: Two times a day (BID) | ORAL | Status: DC
  Administered 2017-07-08: 25 mg via ORAL
  Filled 2017-07-08: qty 1

## 2017-07-08 MED ORDER — METOPROLOL TARTRATE 5 MG/5ML IV SOLN
5.0000 mg | Freq: Once | INTRAVENOUS | Status: AC
  Administered 2017-07-08: 5 mg via INTRAVENOUS

## 2017-07-08 NOTE — Progress Notes (Signed)
Pulled of fentanyl up to give to patient but ended up not having to use. Wasted of fentanyl in sink with Renato Gails, RN.

## 2017-07-08 NOTE — Consult Note (Signed)
Physical Medicine and Rehabilitation Consult Reason for Consult: Right side weakness with aphasia Referring Physician: Dr.Xu   HPI: [name] is a 57 y.o. right-handed male with history of end-stage renal disease with hemodialysis, previous CVAs followed by Dr. Terrace Arabia, hypertension.  Per chart review, patient lives alone.  Reported to be independent prior to admission.  Presented 07/05/2017 with right-sided weakness and aphasia.  Cranial CT scan reviewed, showing left MCA infarction. Per report, no hemorrhage.  CT angiogram of head and neck as well as CT cerebral perfusion scan showed left MCA infarction.  Left M2 inferior division occlusion at its origin.  50% left common carotid artery stenosis.  Patient did not receive TPA.  MRI again with left MCA territory cortical infarct as well as remote small vessel infarcts.  Echocardiogram with ejection fraction of 45% grade 1 diastolic dysfunction.  Underwent revascularization of left MCA inferior division occlusion 07/06/2017 per interventional radiology.  Patient currently remains on aspirin and Plavix for CVA prophylaxis x3 months then Plavix alone.  Subcutaneous Lovenox for DVT prophylaxis.  MRSA PCR screening positive with contact precautions hemodialysis ongoing as per renal services.  Dysphagia #1 thin liquid diet.  Physical therapy evaluation completed with recommendations of physical medicine rehab consult.  Review of Systems  Unable to perform ROS: Acuity of condition   Past Medical History:  Diagnosis Date  . ACHALASIA   . ANEMIA-NOS   . CAD (coronary artery disease), 3 vessel significant disease.    . Cardiomyopathy (HCC) 05/12/2017  . CEREBROVASCULAR ACCIDENT, HX OF   . CHF (congestive heart failure) (HCC)   . CKD (chronic kidney disease) stage 4, GFR 15-29 ml/min (HCC)   . Constipation   . DEGENERATIVE JOINT DISEASE, SHOULDER    knee  . DEPRESSION    2006- not depressed any longer  . Family history of adverse reaction to  anesthesia    Mother, hard to awaken  . GERD    no meds required  . Gout    takes Uloric daily  . H/O hiatal hernia   . Headache(784.0)   . Heart attack (HCC)   . History of colon polyps   . History of diastolic dysfunction, grade 2 by echo   . HYPERLIPIDEMIA    takes crestor daily  . Hyperlipidemia   . HYPERTENSION    takes Norvasc,Catapress,Hydralazine,Imdur,and Metoprolol daily  . Impaired glucose tolerance   . Insomnia    takes Ambien nightly  . NSTEMI (non-ST elevated myocardial infarction), 06/30/11 07/01/2011  . RENAL CALCULUS, HX OF   . RENAL INSUFFICIENCY    12/2013- TThSat  . S/P coronary artery stent placement, PTCA/DES Resolute to mid-LAD via the LIMA graft, and PTCA of apical 95% stenosis 07/05/11 07/04/2011  . S/P PTCA (percutaneous transluminal coronary angioplasty), 06/30/11 07/01/2011  . Seizures (HCC)    INSULIN INDUCED  . Sleep apnea    SLEEP STUDY IN [place] , NO MACHINE YET  1 YEAR  . Stroke (HCC)    93/2005/2006/2007;left sided weakness.  1993 12/2013  . Thyroid disease    Past Surgical History:  Procedure Laterality Date  . ARCH AORTOGRAM N/A 02/19/2012   Procedure: ARCH AORTOGRAM;  Surgeon: Fransisco Hertz, MD;  Location: [facility] CATH LAB;  Service: Cardiovascular;  Laterality: N/A;  . AV FISTULA PLACEMENT  12/02/2011   Procedure: ARTERIOVENOUS (AV) FISTULA CREATION;  Surgeon: Fransisco Hertz, MD;  Location: [facility] OR;  Service: Vascular;  Laterality: Left;  Ultrasound Guided  . AV FISTULA PLACEMENT  Left 06/08/2012   Procedure: ARTERIOVENOUS (AV) FISTULA CREATION;  Surgeon: Fransisco Hertz, MD;  Location: [facility] OR;  Service: Vascular;  Laterality: Left;  . BASCILIC VEIN TRANSPOSITION Left 09/06/2014   Procedure: FIRST STAGE BASILIC VEIN TRANSPOSITION ;  Surgeon: Fransisco Hertz, MD;  Location: [facility] OR;  Service: Vascular;  Laterality: Left;  . BASCILIC VEIN TRANSPOSITION Left 12/06/2014   Procedure: SECOND STAGE BRACHIAL VEIN TRANSPOSITION ;  Surgeon: Fransisco Hertz, MD;  Location: [facility]  OR;  Service: Vascular;  Laterality: Left;  . CARDIAC CATHETERIZATION    . COLONOSCOPY    . CORONARY ANGIOPLASTY     06/30/11   AT [facility]  . CORONARY ARTERY BYPASS GRAFT  04/01/2011   Procedure: CORONARY ARTERY BYPASS GRAFTING (CABG);  Surgeon: Kathlee Nations Suann Larry, MD;  Location: [facility] OR;  Service: Open Heart Surgery;  Laterality: N/A;  . HERNIA REPAIR  2013  . INSERTION OF DIALYSIS CATHETER  04/01/2011   Procedure: INSERTION OF DIALYSIS CATHETER;  Surgeon: Nilda Simmer, MD;  Location: [facility] OR;  Service: Vascular;  Laterality: N/A;  . IR ANGIO INTRA EXTRACRAN SEL COM CAROTID INNOMINATE UNI R MOD SED  07/06/2017  . IR ANGIO VERTEBRAL SEL VERTEBRAL BILAT MOD SED  07/06/2017  . IR CT HEAD LTD  07/06/2017  . IR PERCUTANEOUS ART THROMBECTOMY/INFUSION INTRACRANIAL INC DIAG ANGIO  07/06/2017  . KNEE SURGERY Right 01/07/2011   arthroscopy  . LEFT HEART CATH AND CORS/GRAFTS ANGIOGRAPHY N/A 03/05/2017   Procedure: LEFT HEART CATH AND CORS/GRAFTS ANGIOGRAPHY;  Surgeon: Runell Gess, MD;  Location: [facility] INVASIVE CV LAB;  Service: Cardiovascular;  Laterality: N/A;  . LEFT HEART CATHETERIZATION WITH CORONARY ANGIOGRAM N/A 03/28/2011   Procedure: LEFT HEART CATHETERIZATION WITH CORONARY ANGIOGRAM;  Surgeon: Lennette Bihari, MD;  Location: [facility] CATH LAB;  Service: Cardiovascular;  Laterality: N/A;  pending creatnine  . LEFT HEART CATHETERIZATION WITH CORONARY/GRAFT ANGIOGRAM N/A 06/30/2011   Procedure: LEFT HEART CATHETERIZATION WITH Isabel Caprice;  Surgeon: Thurmon Fair, MD;  Location: [facility] CATH LAB;  Service: Cardiovascular;  Laterality: N/A;  . LIGATION OF ARTERIOVENOUS  FISTULA Left 06/08/2012   Procedure: LIGATION OF ARTERIOVENOUS  FISTULA;  Surgeon: Fransisco Hertz, MD;  Location: [facility] OR;  Service: Vascular;  Laterality: Left;  . NM MYOCAR PERF EJECTION FRACTION  06/25/2011   There is evidence of mild ischemia in the basal inferolateral and mid inferolateral regions. (Extent 7 %) The post-stress ejection  fraction is 44%. There is mild hypocontractility in the distal inferoapical segment. baseline T wave inversion is noted in leads I, avL, II, V2. This is a low risk scan. The present perfusion study suggests significant myocardial salvage with mild residual ischemia .  Marland Kitchen PERCUTANEOUS CORONARY INTERVENTION-BALLOON ONLY  06/30/2011   Procedure: PERCUTANEOUS CORONARY INTERVENTION-BALLOON ONLY;  Surgeon: Thurmon Fair, MD;  Location: [facility] CATH LAB;  Service: Cardiovascular;;  . PERCUTANEOUS CORONARY STENT INTERVENTION (PCI-S) N/A 07/04/2011   Procedure: PERCUTANEOUS CORONARY STENT INTERVENTION (PCI-S);  Surgeon: Lennette Bihari, MD;  Location: [facility] CATH LAB;  Service: Cardiovascular;  Laterality: N/A;  . RADIOLOGY WITH ANESTHESIA N/A 07/05/2017   Procedure: RADIOLOGY WITH ANESTHESIA;  Surgeon: Julieanne Cotton, MD;  Location: [facility] OR;  Service: Radiology;  Laterality: N/A;  . REVISION OF ARTERIOVENOUS GORETEX GRAFT Left 12/06/2014   Procedure: REVISION OF ARTERIOVENOUS GORETEX GRAFT USING 51mmx10cm;  Surgeon: Fransisco Hertz, MD;  Location: [facility] OR;  Service: Vascular;  Laterality: Left;  . STOMACH SURGERY  2009   achalasia  . SUBCLAVIAN STENT  PLACEMENT  12/20/2011  . TONSILLECTOMY    . TONSILLECTOMY     Family History  Problem Relation Age of Onset  . Cancer Mother   . Heart disease Mother   . Hyperlipidemia Mother   . Hypertension Mother   . Alzheimer's disease Mother   . Cancer Father   . Heart disease Father   . Hypertension Other   . Stroke Other   . Hyperlipidemia Other    Social History:  reports that he has never smoked. He quit smokeless tobacco use about 14 years ago. His smokeless tobacco use included chew. He reports that he drinks about 1.2 oz of alcohol per week. He reports that he does not use drugs. Allergies:  Allergies  Allergen Reactions  . Shrimp [Shellfish Allergy] Shortness Of Breath  . Atorvastatin Other (See Comments)    weakness  . Insulins Other (See Comments)    Patient  unsure as to the kind of insulin but states that it has previously caused seizures.  Most recent hospitalization (Jan 2013) received insulin but did not have reaction. LIKELY DUE TO SIGNIFICANT HYPOGLYCEMIA  . Simvastatin Other (See Comments)     abnormal liver tests  . Ultram [Tramadol] Other (See Comments)    Liver enzyme    Medications Prior to Admission  Medication Sig Dispense Refill  . amLODipine (NORVASC) 5 MG tablet TAKE ONE TABLET BY MOUTH ONE TIME DAILY  30 tablet 11  . atorvastatin (LIPITOR) 40 MG tablet Take 1 tablet (40 mg total) by mouth daily. 90 tablet 3  . cloNIDine (CATAPRES) 0.2 MG tablet Take 1 tablet (0.2 mg total) by mouth 2 (two) times daily. 30 tablet 3  . clopidogrel (PLAVIX) 75 MG tablet Take 1 tablet (75 mg total) by mouth daily. 90 tablet 4  . doxercalciferol (HECTOROL) 4 MCG/2ML injection Inject 2.5 mLs (5 mcg total) into the vein every Monday, Wednesday, and Friday with hemodialysis. 2 mL   . ferric gluconate 125 mg in sodium chloride 0.9 % 100 mL Inject 125 mg into the vein every Monday, Wednesday, and Friday with hemodialysis.    Marland Kitchen memantine (NAMENDA) 10 MG tablet Take 1 tablet (10 mg total) by mouth 2 (two) times daily. 180 tablet 3  . metoprolol tartrate (LOPRESSOR) 50 MG tablet Take 1 tablet (50 mg total) by mouth 2 (two) times daily. 180 tablet 3  . nitroGLYCERIN (NITROSTAT) 0.4 MG SL tablet PLACE 1 TABLET UNDER TONGUE AS NEEDED FOR CHEST PAIN EVERY 5 MINUTES (MAX 3 DOSES) 25 tablet 4  . terazosin (HYTRIN) 10 MG capsule TAKE 1 CAPSULE BY MOUTH DAILY AT BEDTIME 90 capsule 3  . zolpidem (AMBIEN) 10 MG tablet Take 1 tablet (10 mg total) by mouth at bedtime as needed. for sleep 90 tablet 1  . aspirin EC 81 MG EC tablet Take 1 tablet (81 mg total) by mouth daily.    Marland Kitchen ketoconazole (NIZORAL) 2 % cream Apply 1 application topically daily. 60 g 2  . lanthanum ([name]) 1000 MG chewable tablet Chew 2 tablets (2,000 mg total) by mouth 3 (three) times daily with meals.  Yearly physical due in July must see MD for refills 540 tablet 0    Home: Home Living Family/patient expects to be discharged to:: Private residence Living Arrangements: Alone Type of Home: House Additional Comments: Pt poor historian. Pt confirms he lives alone. Unsure of accuracy.  Functional History: Prior Function Level of Independence: Independent Comments: Poor historian Functional Status:  Mobility: Bed Mobility Overal bed mobility: Needs  Assistance Bed Mobility: Supine to Sit Supine to sit: +2 for physical assistance, Mod assist General bed mobility comments: up in the chair on arrival Transfers Overall transfer level: Needs assistance Equipment used: 2 person hand held assist Transfers: Sit to/from Stand Sit to Stand: Mod assist, +2 physical assistance Stand pivot transfers: Mod assist, +2 physical assistance General transfer comment: mod assist to attain a symmetrical posture/effort during the sit to stand.. Ambulation/Gait Ambulation/Gait assistance: Max assist, +2 physical assistance Ambulation Distance (Feet): 5 Feet(x2) Assistive device: Rolling walker (2 wheeled) Gait Pattern/deviations: Step-to pattern, Decreased step length - right, Decreased step length - left, Decreased stance time - right, Decreased stride length, Wide base of support General Gait Details: Decidedly hemiparetic in nature.  Pt needed maximal assist to shift weight left, total assist to advance R LE, maximal support at R knee during w/bearing and more trunk control, walker control and R hand control than 1 other therapist could manage effectively.    ADL: ADL Overall ADL's : Needs assistance/impaired Eating/Feeding: Minimal assistance, Sitting Eating/Feeding Details (indicate cue type and reason): Pt performing self feeding and sinking with Minimal assistance. Pt bringing spoon to mouth to eat pudding with LUE. Pt pocketing food; RN notified Grooming: Wash/dry face, Sitting Upper Body  Bathing: Maximal assistance, Sitting Lower Body Bathing: Maximal assistance, Sit to/from stand Upper Body Dressing : Maximal assistance, Sitting Lower Body Dressing: Maximal assistance, Sit to/from stand Lower Body Dressing Details (indicate cue type and reason): Max A to don socks at bed level and elevating his feet. Mod A for standing balance Toilet Transfer: Maximal assistance, +2 for physical assistance, Stand-pivot Toilet Transfer Details (indicate cue type and reason): A to weight shift, keep RUE On walker and block RLE; sit<>stand recliner; take steps, sit back in recliner Functional mobility during ADLs: Moderate assistance, +2 for physical assistance(stand pivot only) General ADL Comments: Pt with poor cognition, balance, strength, and activity tolerance.  Cognition: Cognition Overall Cognitive Status: Impaired/Different from baseline Arousal/Alertness: Awake/alert Orientation Level: Oriented to person(global aphasia, mute) Attention: Sustained Sustained Attention: Impaired Sustained Attention Impairment: Verbal basic, Functional basic Awareness: Impaired Awareness Impairment: Intellectual impairment Comments: difficulty to fully assess due to level of aphasia Cognition Arousal/Alertness: Awake/alert Behavior During Therapy: Flat affect Overall Cognitive Status: Impaired/Different from baseline Area of Impairment: Attention, Memory, Following commands, Safety/judgement, Awareness, Problem solving Current Attention Level: Focused Memory: Decreased short-term memory, Decreased recall of precautions Following Commands: Follows one step commands inconsistently Safety/Judgement: Decreased awareness of safety, Decreased awareness of deficits Awareness: Intellectual Problem Solving: Slow processing, Decreased initiation, Difficulty sequencing, Requires verbal cues, Requires tactile cues General Comments: No attention given to the R side even with direct hand over hand rubbing of  right arm with Left hand., No attempts to attend to R side with multimodal cues given by therapist.  Blood pressure 113/79, pulse 79, temperature 97.9 F (36.6 C), temperature source Oral, resp. rate 15, height 5\' 11"  (1.803 m), weight 87.8 kg (193 lb 9 oz), SpO2 100 %. Physical Exam  Vitals reviewed. Constitutional: He appears well-developed and well-nourished.  57 year old African-American male sitting up in bed.  HENT:  Head: Normocephalic and atraumatic.  Eyes: Right eye exhibits no discharge. Left eye exhibits no discharge.  Pupils reactive to light  Neck: Normal range of motion. Neck supple. No thyromegaly present.  Cardiovascular: Normal rate, regular rhythm and normal heart sounds.  Respiratory: Effort normal and breath sounds normal. No respiratory distress.  GI: Soft. Bowel sounds are normal. He exhibits no distension.  Musculoskeletal:  No edema or tenderness in extremities  Neurological: He is alert.  Patient was nonverbal throughout exam.   Made eye contact with examiner. Exam limited due to global aphasia Spontaneously moving LUE/LLE slightly, no movement noted in RUE/RLE Facial weakness  Skin: Skin is warm and dry.  Psychiatric:  Unable to assess due to aphasia    Results for orders placed or performed during the hospital encounter of 07/05/17 (from the past 24 hour(s))  Rapid urine drug screen (hospital performed)     Status: None   Collection Time: 07/07/17  5:52 PM  Result Value Ref Range   Opiates NONE DETECTED NONE DETECTED   Cocaine NONE DETECTED NONE DETECTED   Benzodiazepines NONE DETECTED NONE DETECTED   Amphetamines NONE DETECTED NONE DETECTED   Tetrahydrocannabinol NONE DETECTED NONE DETECTED   Barbiturates NONE DETECTED NONE DETECTED  Basic metabolic panel     Status: Abnormal   Collection Time: 07/08/17  4:06 AM  Result Value Ref Range   Sodium 135 135 - 145 mmol/L   Potassium 3.7 3.5 - 5.1 mmol/L   Chloride 96 (L) 101 - 111 mmol/L   CO2 21 (L)  22 - 32 mmol/L   Glucose, Bld 86 65 - 99 mg/dL   BUN 16 6 - 20 mg/dL   Creatinine, Ser 1.61 (H) 0.61 - 1.24 mg/dL   Calcium 09.6 8.9 - 04.5 mg/dL   GFR calc non Af Amer 6 (L) >60 mL/min   GFR calc Af Amer 6 (L) >60 mL/min   Anion gap 18 (H) 5 - 15  CBC     Status: None   Collection Time: 07/08/17  4:06 AM  Result Value Ref Range   WBC 8.5 4.0 - 10.5 K/uL   RBC 4.27 4.22 - 5.81 MIL/uL   Hemoglobin 13.2 13.0 - 17.0 g/dL   HCT 40.9 81.1 - 91.4 %   MCV 93.0 78.0 - 100.0 fL   MCH 30.9 26.0 - 34.0 pg   MCHC 33.2 30.0 - 36.0 g/dL   RDW 78.2 95.6 - 21.3 %   Platelets 220 150 - 400 K/uL  Glucose, capillary     Status: Abnormal   Collection Time: 07/08/17 11:49 AM  Result Value Ref Range   Glucose-Capillary 102 (H) 65 - 99 mg/dL   Comment 1 Notify RN    Comment 2 Document in Chart    Ct Head Wo Contrast  Result Date: 07/06/2017 CLINICAL DATA:  Stroke follow-up EXAM: CT HEAD WITHOUT CONTRAST TECHNIQUE: Contiguous axial images were obtained from the base of the skull through the vertex without intravenous contrast. COMPARISON:  Brain MRI 07/06/2017 Head CT 07/06/2017 at 5:35 a.m. FINDINGS: Brain: Decreased density of material of the left basal ganglia, consistent with clearing of contrast staining. Right temporal encephalomalacia is unchanged. No acute hemorrhage or mass effect. No hydrocephalus. Vascular: Small amount of residual contrast material within the vessels. Skull: Normal visualized skull base, calvarium and extracranial soft tissues. Sinuses/Orbits: No sinus fluid levels or advanced mucosal thickening. No mastoid effusion. Normal orbits. IMPRESSION: Decreased density of hyperdense material at the left basal ganglia, consistent with clearing of contrast staining. No new abnormality. Electronically Signed   By: Deatra Robinson M.D.   On: 07/06/2017 23:24   Assessment/Plan: Diagnosis: Left MCA infarct Labs and images independently reviewed.  Records reviewed and summated  above. Stroke: Continue secondary stroke prophylaxis and Risk Factor Modification listed below:   Antiplatelet therapy:   Blood Pressure Management:  Continue current medication with prn's with permisive  HTN per primary team Statin Agent:   Left sided hemiparesis: fit for orthosis to prevent contractures (resting hand splint for day, wrist cock up splint at night, PRAFO, etc) Motor recovery: Fluoxetine   1. Does the need for close, 24 hr/day medical supervision in concert with the patient's rehab needs make it unreasonable for this patient to be served in a less intensive setting? Yes  2. Co-Morbidities requiring supervision/potential complications:  End-stage renal disease with hemodialysis (recs per [facility]ph[facility]), previous CVAs, HTN (monitor and provide prns in accordance with increased physical exertion and pain), post-stroke dysphagia (advance diet as tolerated0< diastolic dysfunction (monitor for signs/symptoms of fluid overload), anemia of chronic disease (transfuse if necessary to ensure appropriate perfusion for increased activity tolerance) 3. Due to bladder management, bowel management, safety, skin/wound care, disease management, medication administration and patient education, does the patient require 24 hr/day rehab nursing? Yes 4. Does the patient require coordinated care of a physician, rehab nurse, PT (1-2 hrs/day, 5 days/week), OT (1-2 hrs/day, 5 days/week) and SLP (1-2 hrs/day, 5 days/week) to address physical and functional deficits in the context of the above medical diagnosis(es)? Yes Addressing deficits in the following areas: balance, endurance, locomotion, strength, transferring, bowel/bladder control, bathing, dressing, feeding, grooming, toileting, cognition, speech, language, swallowing and psychosocial support 5. Can the patient actively participate in an intensive therapy program of at least 3 hrs of therapy per day at least 5 days per week? Yes 6. The potential for patient to  make measurable gains while on inpatient rehab is excellent 7. Anticipated functional outcomes upon discharge from inpatient rehab are min assist  with PT, min assist with OT, min assist with SLP. 8. Estimated rehab length of stay to reach the above functional goals is: 24-28 days. 9. Anticipated D/C setting: TBD 10. Anticipated post D/C treatments: HH therapy and Home excercise program 11. Overall Rehab/Functional Prognosis: good  RECOMMENDATIONS: This patient's condition is appropriate for continued rehabilitative care in the following setting: CIR if adequate caregiver support available upon discharge, otherwise recommend SNF with PM&R outpatient follow up. Patient has agreed to participate in recommended program. Potentially Note that insurance prior authorization may be required for reimbursement for recommended care.  Comment: Rehab Admissions Coordinator to follow up.   I have personally performed a face to face diagnostic evaluation, including, but not limited to relevant history and physical exam findings, of this patient and developed relevant assessment and plan.  Additionally, I have reviewed and concur with the physician assistant's documentation above.   Maryla Morrow, MD, ABPMR Gregory Rossetti Angiulli, PA-C 07/08/2017

## 2017-07-08 NOTE — Consult Note (Addendum)
   [facility] Inpatient Consult   07/08/2017  [name] 01-13-1961 [id number]    Multiple patient outreach attempts were attempted by [facility] Telephonic RNCM prior to hospitalization. However, [facility] Telephonic RNCM was unable to reach [name].  Received request from [facility] c3 nurse that writer follow up with [name] for [employer].  Went to bedside to speak with [name]. He is currently in ICU but has orders to transfer to floor. [name] was resting upon bedside visit. Spoke with nursing. [name] also has global aphasia.   Will follow back up with [name] at later time.   Raiford Noble, MSN-Ed, RN,BSN [facility] Liaison [phone]

## 2017-07-08 NOTE — Progress Notes (Addendum)
Physical Therapy Treatment Patient Details Name: [name] MRN: [id number] DOB: 01/08/1961 Today's Date: 07/08/2017    History of Present Illness 57 y.o. male with a history of ESRD, previous stroke. Presenting with new right weakness, aphasia, and neglect. MRI showing acute left MCA territory cortical infarct     PT Comments    Pt making slow improvements.  Some difficulties included assisting pt's Left side to not overpower the right, helping pt stay in good alignment while working in standing, pre-gait and short distance gait training.   Follow Up Recommendations  CIR     Equipment Recommendations  Other (comment)(TBA)    Recommendations for Other Services Rehab consult     Precautions / Restrictions Precautions Precautions: Fall Restrictions Weight Bearing Restrictions: No    Mobility  Bed Mobility               General bed mobility comments: up in the chair on arrival  Transfers Overall transfer level: Needs assistance   Transfers: Sit to/from Stand Sit to Stand: Mod assist;+2 physical assistance         General transfer comment: mod assist to attain a symmetrical posture/effort during the sit to stand..  Ambulation/Gait Ambulation/Gait assistance: Max assist;+2 physical assistance Ambulation Distance (Feet): 5 Feet(x2) Assistive device: Rolling walker (2 wheeled) Gait Pattern/deviations: Step-to pattern;Decreased step length - right;Decreased step length - left;Decreased stance time - right;Decreased stride length;Wide base of support     General Gait Details: Decidedly hemiparetic in nature.  Pt needed maximal assist to shift weight left, total assist to advance R LE, maximal support at R knee during w/bearing and more trunk control, walker control and R hand control than 1 other therapist could manage effectively.   Stairs             Wheelchair Mobility    Modified Rankin (Stroke Patients Only) Modified Rankin (Stroke Patients  Only) Pre-Morbid Rankin Score: No symptoms Modified Rankin: Severe disability     Balance Overall balance assessment: Needs assistance Sitting-balance support: No upper extremity supported;Feet supported;Single extremity supported Sitting balance-Leahy Scale: Fair     Standing balance support: During functional activity;Single extremity supported;Bilateral upper extremity supported Standing balance-Leahy Scale: Poor Standing balance comment: stood x3 in RW working on upright posture, Right knee supported, but also working on assisting pt to attain symetrical posture in the upper trunk                            Cognition Arousal/Alertness: Awake/alert Behavior During Therapy: Flat affect Overall Cognitive Status: Impaired/Different from baseline Area of Impairment: Attention;Memory;Following commands;Safety/judgement;Awareness;Problem solving                   Current Attention Level: Focused Memory: Decreased short-term memory;Decreased recall of precautions Following Commands: Follows one step commands inconsistently Safety/Judgement: Decreased awareness of safety;Decreased awareness of deficits Awareness: Intellectual Problem Solving: Slow processing;Decreased initiation;Difficulty sequencing;Requires verbal cues;Requires tactile cues General Comments: No attention given to the R side even with direct hand over hand rubbing of right arm with Left hand., No attempts to attend to R side with multimodal cues given by therapist.      Exercises      General Comments General comments (skin integrity, edema, etc.): pulse in the 110's to 120's, sats in the low 90's overall      Pertinent Vitals/Pain Pain Assessment: Faces Faces Pain Scale: No hurt Pain Intervention(s): Monitored during session    Home Living  Prior Function            PT Goals (current goals can now be found in the care plan section) Acute Rehab PT  Goals Patient Stated Goal: Unstated' PT Goal Formulation: Patient unable to participate in goal setting Time For Goal Achievement: 07/20/17 Potential to Achieve Goals: Fair Progress towards PT goals: Progressing toward goals    Frequency    Min 4X/week      PT Plan Current plan remains appropriate    Co-evaluation PT/OT/SLP Co-Evaluation/Treatment: Yes Reason for Co-Treatment: Complexity of the patient's impairments (multi-system involvement) PT goals addressed during session: Mobility/safety with mobility        AM-PAC PT "6 Clicks" Daily Activity  Outcome Measure  Difficulty turning over in bed (including adjusting bedclothes, sheets and blankets)?: Unable Difficulty moving from lying on back to sitting on the side of the bed? : Unable Difficulty sitting down on and standing up from a chair with arms (e.g., wheelchair, bedside commode, etc,.)?: Unable Help needed moving to and from a bed to chair (including a wheelchair)?: A Lot Help needed walking in hospital room?: A Lot Help needed climbing 3-5 steps with a railing? : A Lot 6 Click Score: 9    End of Session Equipment Utilized During Treatment: Other (comment)(warm up bil LE ROM) Activity Tolerance: Patient tolerated treatment well Patient left: in chair;with call bell/phone within reach;with chair alarm set Nurse Communication: Mobility status PT Visit Diagnosis: Unsteadiness on feet (R26.81);Difficulty in walking, not elsewhere classified (R26.2);Other symptoms and signs involving the nervous system (R29.898)     Time: 1610-9604 PT Time Calculation (min) (ACUTE ONLY): 38 min  Charges:  $Gait Training: 8-22 mins $Therapeutic Activity: 8-22 mins                    G Codes:       07-21-2017  [place] Bing, PT [phone]  (pager)   Eliseo Gum Maxten Shuler 07/21/17, 11:41 AM

## 2017-07-08 NOTE — Progress Notes (Signed)
Sudden onset of severe hypertension and tachycardia. Patient clutching head. No venous or arterial access currently. ICU nurses unable to utilize dialysis fistula for access. NIHSS has been 17.   A/R: Large left MCA stroke s/p failed thrombectomy attempt. Now with severe HTN, tachycardia and headache 1. Obtaining STAT CT. 2. CCM has been consulted for central line, BP management and HR management.   Electronically signed: Dr. Caryl Pina

## 2017-07-08 NOTE — Consult Note (Addendum)
Initial Pulmonary/Critical Care Consultation  Name: [name] MRN: [id number] DOB: 02/18/1961    ADMISSION DATE:  07/05/2017 CONSULTATION DATE:  4/24  REFERRING MD:  Dr. Otelia Limes  CHIEF COMPLAINT:  Hypotension  HISTORY OF PRESENT ILLNESS:   This 57 year old African-American male is seen in consultation at the request of Dr. Caryl Pina for recommendations and further evaluation and management of hypertension, tachycardia and consideration for central line placement.  He was admitted 4/24 with right sided weakness and was found to have left M2 occlusion CVA. He was outside the window for tPA and was taken for neuro-IR mechanical thrombolysis. He slowly improved after the procedure gaining some strength back, started swallowing some thickened liquids and pureed food. He was preparing for transfer to CIR. Then, earlier this evening evening (4/24) he developed severe hypertension and tachycardia. He was clutching his head. He was sent for STAT CT head, which does not demonstrate any new acute intracranial issues. He lost all IV access. PCCM consulted for further evaluation.   Review of his home medication list reveals that he is on amlodipine 5 mg p.o. daily, clonidine 0.2 mg p.o. twice daily, Lopressor 50 mg p.o. twice daily, terazosin 10 mg p.o. nightly nitroglycerin 0.4 mg sublingual every 5 minutes as needed for chest pain.  He has not received these medications during his current hospitalization.  Last dosage administration of these medications is unknown at this time.  With PMH as below, which is significant for OSA (not on CPAP), CAD s/p PCA and CABG, hypertension, hyperlipidemia, and ESRD with HD (MWF).   PAST MEDICAL HISTORY :  He  has a past medical history of ACHALASIA, ANEMIA-NOS, CAD (coronary artery disease), 3 vessel significant disease. , Cardiomyopathy (HCC) (05/12/2017), CEREBROVASCULAR ACCIDENT, HX OF, CHF (congestive heart failure) (HCC), CKD (chronic kidney disease) stage 4,  GFR 15-29 ml/min (HCC), Constipation, DEGENERATIVE JOINT DISEASE, SHOULDER, DEPRESSION, Family history of adverse reaction to anesthesia, GERD, Gout, H/O hiatal hernia, Headache(784.0), Heart attack (HCC), History of colon polyps, History of diastolic dysfunction, grade 2 by echo, HYPERLIPIDEMIA, Hyperlipidemia, HYPERTENSION, Impaired glucose tolerance, Insomnia, NSTEMI (non-ST elevated myocardial infarction), 06/30/11 (07/01/2011), RENAL CALCULUS, HX OF, RENAL INSUFFICIENCY, S/P coronary artery stent placement, PTCA/DES Resolute to mid-LAD via the LIMA graft, and PTCA of apical 95% stenosis 07/05/11 (07/04/2011), S/P PTCA (percutaneous transluminal coronary angioplasty), 06/30/11 (07/01/2011), Seizures (HCC), Sleep apnea, Stroke (HCC), and Thyroid disease.  PAST SURGICAL HISTORY: He  has a past surgical history that includes Tonsillectomy; Stomach surgery (2009); Knee surgery (Right, 01/07/2011); Insertion of dialysis catheter (04/01/2011); Cardiac catheterization; AV fistula placement (12/02/2011); Subclavian stent placement (12/20/2011); Coronary artery bypass graft (04/01/2011); Coronary angioplasty; Colonoscopy; Ligation of arteriovenous  fistula (Left, 06/08/2012); AV fistula placement (Left, 06/08/2012); left heart catheterization with coronary angiogram (N/A, 03/28/2011); left heart catheterization with coronary/graft angiogram (N/A, 06/30/2011); Percutaneous coronary intervention-balloon only (06/30/2011); percutaneous coronary stent intervention (pci-s) (N/A, 07/04/2011); arch aortogram (N/A, 02/19/2012); Hernia repair (2013); Bascilic vein transposition (Left, 09/06/2014); NM MYOCAR PERF EJECTION FRACTION (06/25/2011); Bascilic vein transposition (Left, 12/06/2014); Revision of arteriovenous goretex graft (Left, 12/06/2014); Tonsillectomy; LEFT HEART CATH AND CORS/GRAFTS ANGIOGRAPHY (N/A, 03/05/2017); Radiology with anesthesia (N/A, 07/05/2017); IR ANGIO INTRA EXTRACRAN SEL COM CAROTID INNOMINATE UNI R MOD SED (07/06/2017);  IR ANGIO VERTEBRAL SEL VERTEBRAL BILAT MOD SED (07/06/2017); IR PERCUTANEOUS ART THROMBECTOMY/INFUSION INTRACRANIAL INC DIAG ANGIO (07/06/2017); and IR CT Head Ltd (07/06/2017).  Allergies  Allergen Reactions  . Shrimp [Shellfish Allergy] Shortness Of Breath  . Atorvastatin Other (See Comments)    weakness  .  Insulins Other (See Comments)    Patient unsure as to the kind of insulin but states that it has previously caused seizures.  Most recent hospitalization (Jan 2013) received insulin but did not have reaction. LIKELY DUE TO SIGNIFICANT HYPOGLYCEMIA  . Simvastatin Other (See Comments)     abnormal liver tests  . Ultram [Tramadol] Other (See Comments)    Liver enzyme     No current facility-administered medications on file prior to encounter.    Current Outpatient Medications on File Prior to Encounter  Medication Sig  . amLODipine (NORVASC) 5 MG tablet TAKE ONE TABLET BY MOUTH ONE TIME DAILY   . atorvastatin (LIPITOR) 40 MG tablet Take 1 tablet (40 mg total) by mouth daily.  . cloNIDine (CATAPRES) 0.2 MG tablet Take 1 tablet (0.2 mg total) by mouth 2 (two) times daily.  . clopidogrel (PLAVIX) 75 MG tablet Take 1 tablet (75 mg total) by mouth daily.  Marland Kitchen doxercalciferol (HECTOROL) 4 MCG/2ML injection Inject 2.5 mLs (5 mcg total) into the vein every Monday, Wednesday, and Friday with hemodialysis.  . ferric gluconate 125 mg in sodium chloride 0.9 % 100 mL Inject 125 mg into the vein every Monday, Wednesday, and Friday with hemodialysis.  Marland Kitchen memantine (NAMENDA) 10 MG tablet Take 1 tablet (10 mg total) by mouth 2 (two) times daily.  . metoprolol tartrate (LOPRESSOR) 50 MG tablet Take 1 tablet (50 mg total) by mouth 2 (two) times daily.  . nitroGLYCERIN (NITROSTAT) 0.4 MG SL tablet PLACE 1 TABLET UNDER TONGUE AS NEEDED FOR CHEST PAIN EVERY 5 MINUTES (MAX 3 DOSES)  . terazosin (HYTRIN) 10 MG capsule TAKE 1 CAPSULE BY MOUTH DAILY AT BEDTIME  . zolpidem (AMBIEN) 10 MG tablet Take 1 tablet (10 mg  total) by mouth at bedtime as needed. for sleep  . aspirin EC 81 MG EC tablet Take 1 tablet (81 mg total) by mouth daily.  Marland Kitchen ketoconazole (NIZORAL) 2 % cream Apply 1 application topically daily.  Marland Kitchen lanthanum ([name]) 1000 MG chewable tablet Chew 2 tablets (2,000 mg total) by mouth 3 (three) times daily with meals. Yearly physical due in July must see MD for refills    FAMILY HISTORY:  His indicated that his mother is alive. He indicated that his father is alive. He indicated that the status of his other is unknown.   SOCIAL HISTORY: He  reports that he has never smoked. He quit smokeless tobacco use about 14 years ago. His smokeless tobacco use included chew. He reports that he drinks about 1.2 oz of alcohol per week. He reports that he does not use drugs.  REVIEW OF SYSTEMS:   Unobtainable due to aphasia.   VITAL SIGNS: BP (!) 162/112   Pulse (!) 109   Temp 98.1 F (36.7 C) (Oral)   Resp 12   Ht 5\' 11"  (1.803 m)   Wt 87.8 kg (193 lb 9 oz)   SpO2 100%   BMI 27.00 kg/m   INTAKE / OUTPUT: I/O last 3 completed shifts: In: 1667 [P.O.:680; I.V.:750; NG/GT:237] Out: 2334 [Urine:420; Other:1914]  PHYSICAL EXAMINATION: General: No acute distress.  Aphasic.  Moves left side without difficulty.  Right hemiplegia.  Does not follow commands.   Neuro: Facial symmetry.  Able to swallow pills with applesauce without difficulty.  Right Babinski equivocal.  Left Babinski present.  DTR: Right biceps 2+; left biceps 2+; right patellar 2+; left patellar 2+.  Right hemiplegia.   HEENT:  Normocephalic and atraumatic.  PERRLA.  EOMI.  Nares  patent.  No drainage.  No oral thrush.  Mucous membranes moist. Cardiovascular: Regular S1-S2 without murmur, rub or gallop.  Tachycardia. Lungs: Shallow inspirations.  Good air entry.  No crackles.  No wheezes. Abdomen: Soft, flat.  Bowel sounds present.  Nontender, nondistended.  No rebound.  No guarding. Musculoskeletal: No bulk atrophy.  No overt joint  deformities. Extremities: No edema.  No clubbing.  No cyanosis.  Left upper arm HD access (AV fistula). Skin: Dry.  LABS:  BMET Recent Labs  Lab 07/06/17 0510 07/07/17 0804 07/08/17 0406  NA 133* 135 135  K 3.5 3.4* 3.7  CL 101 100* 96*  CO2 18* 21* 21*  BUN 35* 26* 16  CREATININE 13.49* 10.40* 9.17*  GLUCOSE 106* 89 86    Electrolytes Recent Labs  Lab 07/06/17 0510 07/07/17 0804 07/08/17 0406  CALCIUM 9.6 9.2 10.1  PHOS  --  4.5  --     CBC Recent Labs  Lab 07/06/17 0510 07/07/17 0806 07/08/17 0406  WBC 12.0* 7.5 8.5  HGB 11.4* 11.7* 13.2  HCT 32.9* 34.9* 39.7  PLT 197 187 220    Coag's Recent Labs  Lab 07/05/17 2215  APTT 22*  INR 1.16    Sepsis Markers No results for input(s): LATICACIDVEN, PROCALCITON, O2SATVEN in the last 168 hours.  ABG No results for input(s): PHART, PCO2ART, PO2ART in the last 168 hours.  Liver Enzymes Recent Labs  Lab 07/05/17 2215 07/07/17 0804  AST 15  --   ALT 9*  --   ALKPHOS 40  --   BILITOT 1.0  --   ALBUMIN 3.9 3.3*    Cardiac Enzymes No results for input(s): TROPONINI, PROBNP in the last 168 hours.  Glucose Recent Labs  Lab 07/05/17 2228 07/06/17 0158 07/08/17 1149 07/08/17 1708  GLUCAP 125* 97 102* 110*    Imaging No results found.   STUDIES:  CT head (4/24, 1923) shows stable left parietal infarct on a background of multiple chronic infarcts, microvascular ischemic changes and parenchymal volume loss of the brain.  No acute intracranial abnormality was identified.  SIGNIFICANT EVENTS: 4/19: Admitted with right M2 CVA.   DISCUSSION: This 57 year old African-American male with left M2 occlusion is status post neuro IR mechanical thrombolysis.  He is experiencing hypertension and tachycardia, off of his clonidine, metoprolol and other blood pressure medications.  He also complained of headache, but repeat CT shows no acute changes.  Of note, he is able to swallow medications without  difficulty.  As he was approaching discharge to CIR, we will start him on an antihypertensive regimen.  EJ access will be obtained.  We will monitor his clinical progress to determine whether or not he really needs central venous catheter placement.   ASSESSMENT / PLAN:  NEUROLOGIC  Left MCA stroke Case discussed with Dr. Caryl Pina.  CARDIOVASCULAR  Hypertension, multifactorial: On a background of essential hypertension, this patient is probably experiencing withdrawal of his antihypertensive regimen.  Tachycardia Lopressor 5 mg IV single dose now. Start metoprolol tartrate 25 mg p.o. twice daily. Catapres-TTS-1.  RENAL   End-stage renal disease, on HD HD per nephrology  GASTROINTESTINAL  GI prophylaxis: Protonix p.o. Patient is on a statin; however, he has simvastatin and atorvastatin listed in his allergies, albeit the documented reactions do not appear to be true medication allergies.  Check LFTs.  HEMATOLOGIC  Normocytic anemia, improved   Marcelle Smiling, MD Board Certified by the ABIM, Pulmonary Diseases & Critical Care Medicine  [facility] Pager: [phone]  07/08/2017, 7:34 PM  Critical care time: 45 minutes. The treatment and management of the patient's condition was required based on the threat of imminent deterioration. This time reflects time spent by the physician evaluating, providing care and managing the critically ill patient's care. The time was spent at the immediate bedside (or on the same floor/unit and dedicated to this patient's care). Time involved in separately billable procedures is NOT included int he critical care time indicated above. Family meeting and update time may be included above if and only if the patient is unable/incompetent to participate in clinical interview and/or decision making, and the discussion was necessary to determining treatment decisions.  Marcelle Smiling, MD

## 2017-07-08 NOTE — Progress Notes (Signed)
LB PCCM interval note.  20 gauge Left external jugular IV placed by me.   Joneen Roach, AGACNP-BC [facility] Pulmonology/Critical Care Pager [phone] or [phone]  07/08/2017 8:16 PM

## 2017-07-08 NOTE — Care Management Note (Signed)
Case Management Note  Patient Details  Name: [name] MRN: [id number] Date of Birth: [birth year]  Subjective/Objective:  57 y.o. male with a history of ESRD, previous stroke presented on 07/05/17 with new right weakness, aphasia, and neglect. MRI showed acute left MCA territory cortical infarct.  PTA, pt independent, lives alone.                     Action/Plan: PT/OT recommending CIR; will request rehab consult.  Will follow for discharge planning as pt progresses.  Expected Discharge Date:                  Expected Discharge Plan:  IP Rehab Facility  In-House Referral:     Discharge planning Services  CM Consult  Post Acute Care Choice:    Choice offered to:     DME Arranged:    DME Agency:     HH Arranged:    HH Agency:     Status of Service:  In process, will continue to follow  If discussed at Long Length of Stay Meetings, dates discussed:    Additional Comments:  Quintella Baton, RN, BSN  Trauma/Neuro ICU Case Manager [phone]

## 2017-07-08 NOTE — Progress Notes (Signed)
Patient's HR elevated into the 140's sustaining with several PVC's. He had complained of a bad headache and was holding his head. Blood pressure had gone into the 180's (up from the 120's during most of the day). Dr. Otelia Limes notified and stat head CT ordered. CCM consulted for placement of a line. A peripheral EJ was accessed. CCM at the bedside for BP and HR control. An EKG was completed. Results handed to CCM MD and PA. Sible Straley, [place] Scrape, RN

## 2017-07-08 NOTE — Progress Notes (Signed)
Assessment/Plan: 1. 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 Corliss Skains. CTA showed diffuse intracranial stenosis. Post intervention MRI and MRA completed today. Per neuro/IR. 2. ESRD - HD Thursday with limited UF to maintain hemodynamics  Subjective: Interval History: Eval for Rehab  Objective: Vital signs in last 24 hours: Temp:  [97.7 F (36.5 C)-98.2 F (36.8 C)] 98.1 F (36.7 C) (04/24 1600) Pulse Rate:  [38-81] 70 (04/24 1600) Resp:  [14-23] 16 (04/24 1600) BP: (77-150)/(57-100) 111/70 (04/24 1600) SpO2:  [97 %-100 %] 100 % (04/24 1600) Weight change: -1.5 kg (-3 lb 4.9 oz)  Intake/Output from previous day: 04/23 0701 - 04/24 0700 In: 960 [P.O.:240; I.V.:720] Out: 2334 [Urine:420] Intake/Output this shift: Total I/O In: 587 [P.O.:320; I.V.:30; NG/GT:237] Out: -   General appearance: alert and cooperative Extremities: extremities normal, atraumatic, no cyanosis or edema Neurologic: Right hemiplegia, gives me the thumbs up  Lab Results: Recent Labs    07/07/17 0806 07/08/17 0406  WBC 7.5 8.5  HGB 11.7* 13.2  HCT 34.9* 39.7  PLT 187 220   BMET:  Recent Labs    07/07/17 0804 07/08/17 0406  NA 135 135  K 3.4* 3.7  CL 100* 96*  CO2 21* 21*  GLUCOSE 89 86  BUN 26* 16  CREATININE 10.40* 9.17*  CALCIUM 9.2 10.1   No results for input(s): PTH in the last 72 hours. Iron Studies: No results for input(s): IRON, TIBC, TRANSFERRIN, FERRITIN in the last 72 hours. Studies/Results: Ct Head Wo Contrast  Result Date: 07/06/2017 CLINICAL DATA:  Stroke follow-up EXAM: CT HEAD WITHOUT CONTRAST TECHNIQUE: Contiguous axial images were obtained from the base of the skull through the vertex without intravenous contrast. COMPARISON:  Brain MRI 07/06/2017 Head CT 07/06/2017 at 5:35 a.m. FINDINGS: Brain: Decreased density of material of the left basal ganglia, consistent with clearing of  contrast staining. Right temporal encephalomalacia is unchanged. No acute hemorrhage or mass effect. No hydrocephalus. Vascular: Small amount of residual contrast material within the vessels. Skull: Normal visualized skull base, calvarium and extracranial soft tissues. Sinuses/Orbits: No sinus fluid levels or advanced mucosal thickening. No mastoid effusion. Normal orbits. IMPRESSION: Decreased density of hyperdense material at the left basal ganglia, consistent with clearing of contrast staining. No new abnormality. Electronically Signed   By: Deatra Robinson M.D.   On: 07/06/2017 23:24    Scheduled: .  stroke: mapping our early stages of recovery book   Does not apply Once  . aspirin EC  325 mg Oral Daily  . atorvastatin  80 mg Oral Daily  . Chlorhexidine Gluconate Cloth  6 each Topical Q0600  . clopidogrel  75 mg Oral Daily  . doxercalciferol  2 mcg Intravenous Q M,W,F-HD  . enoxaparin (LOVENOX) injection  30 mg Subcutaneous Q24H  . febuxostat  40 mg Oral Daily  . feeding supplement (NEPRO CARB STEADY)  237 mL Oral BID BM  . mouth rinse  15 mL Mouth Rinse BID  . memantine  10 mg Oral BID  . mupirocin ointment  1 application Nasal BID     LOS: 3 days   Lauris Poag 07/08/2017,5:30 PM

## 2017-07-08 NOTE — Progress Notes (Signed)
CT head shows no hemorrhage or malignant edema.   CT head report conclusions: 1. Left parietal region of acute infarction is stable in comparison with prior MRI given differences in technique. No new acute intracranial abnormality identified. 2. Stable background of chronic infarctions, microvascular ischemic changes, parenchymal volume loss of the brain.  Electronically signed: Dr. Caryl Pina

## 2017-07-08 NOTE — Progress Notes (Signed)
Occupational Therapy Treatment Patient Details Name: [name] MRN: [id number] DOB: [birth year] Today's Date: 07/08/2017    History of present illness 57 y.o. male with a history of ESRD, previous stroke. Presenting with new right weakness, aphasia, and neglect. MRI showing acute left MCA territory cortical infarct    OT comments  This 57 yo male admitted with above presents to acute OT with willing to participate. He requires increased time and multi-modal cues to follow commands with sometimes still needing hand over hand or other physical A to do what is asked. Pt appeared be trying to communicate intermittently with thumbs up/down, but was not consistent with this. He will continue to benefit from acute OT with follow up on CIR.  Follow Up Recommendations  CIR;Supervision/Assistance - 24 hour    Equipment Recommendations  Other (comment)(TBD next venue)       Precautions / Restrictions Precautions Precautions: Fall Restrictions Weight Bearing Restrictions: No       Mobility Bed Mobility               General bed mobility comments: up in the chair on arrival  Transfers Overall transfer level: Needs assistance   Transfers: Sit to/from Stand Sit to Stand: Mod assist;+2 physical assistance         General transfer comment: mod assist to attain a symmetrical posture/effort during the sit to stand..    Balance Overall balance assessment: Needs assistance Sitting-balance support: No upper extremity supported;Feet supported;Single extremity supported Sitting balance-Leahy Scale: Fair     Standing balance support: During functional activity;Single extremity supported;Bilateral upper extremity supported Standing balance-Leahy Scale: Poor Standing balance comment: stood x3 in RW working on upright posture, Right knee supported, but also working on assisting pt to attain symetrical posture in the upper trunk                           ADL either  performed or assessed with clinical judgement   ADL Overall ADL's : Needs assistance/impaired     Grooming: Wash/dry Producer, television/film/video: Maximal assistance;+2 for physical assistance;Stand-pivot Toilet Transfer Details (indicate cue type and reason): A to weight shift, keep RUE On walker and block RLE; sit<>stand recliner; take steps, sit back in recliner                 Vision   Additional Comments: Pt only regarded his LUE visually x1 with multimodal cues (verbal, tacile, attempting to get him to follow his LUE to reach for RUE; did not see pt cross midline visually throughout session          Cognition Arousal/Alertness: Awake/alert Behavior During Therapy: Flat affect Overall Cognitive Status: Impaired/Different from baseline Area of Impairment: Attention;Memory;Following commands;Safety/judgement;Awareness;Problem solving                   Current Attention Level: Focused Memory: Decreased short-term memory;Decreased recall of precautions Following Commands: Follows one step commands inconsistently Safety/Judgement: Decreased awareness of safety;Decreased awareness of deficits Awareness: Intellectual Problem Solving: Slow processing;Decreased initiation;Difficulty sequencing;Requires verbal cues;Requires tactile cues General Comments: No attention given to the R side even with direct hand over hand rubbing of right arm with Left hand., No attempts to attend to R side with multimodal cues given by therapist.              General Comments pulse in the  110's to 120's, sats in the low 90's overall    Pertinent Vitals/ Pain       Pain Assessment: Faces Faces Pain Scale: No hurt Pain Intervention(s): Monitored during session         Frequency  Min 2X/week        Progress Toward Goals  OT Goals(current goals can now be found in the care plan section)  Progress towards OT goals: Progressing toward goals  Acute  Rehab OT Goals Patient Stated Goal: Unstated'  Plan Discharge plan remains appropriate    Co-evaluation    PT/OT/SLP Co-Evaluation/Treatment: Yes Reason for Co-Treatment: Complexity of the patient's impairments (multi-system involvement) PT goals addressed during session: Mobility/safety with mobility OT goals addressed during session: Strengthening/ROM;ADL's and self-care      AM-PAC PT "6 Clicks" Daily Activity     Outcome Measure   Help from another person eating meals?: A Lot Help from another person taking care of personal grooming?: A Lot Help from another person toileting, which includes using toliet, bedpan, or urinal?: A Lot Help from another person bathing (including washing, rinsing, drying)?: A Lot Help from another person to put on and taking off regular upper body clothing?: A Lot Help from another person to put on and taking off regular lower body clothing?: Total 6 Click Score: 11    End of Session Equipment Utilized During Treatment: Gait belt;Rolling walker  OT Visit Diagnosis: Unsteadiness on feet (R26.81);Other abnormalities of gait and mobility (R26.89);Other symptoms and signs involving cognitive function;Low vision, both eyes (H54.2)   Activity Tolerance Patient tolerated treatment well   Patient Left in chair;with call bell/phone within reach;with chair alarm set   Nurse Communication (RN had already gotten pt up earlier so was aware of mobility status)        Time: 0865-7846 OT Time Calculation (min): 38 min  Charges: OT General Charges $OT Visit: 1 Visit OT Treatments $Therapeutic Activity: 8-22 mins  Ignacia Palma, OTR/L [phone] 07/08/2017

## 2017-07-08 NOTE — Progress Notes (Signed)
IV team unable to get a peripheral IV this am. Will make MD aware. Dashun Borre, [place] Scrape, RN

## 2017-07-08 NOTE — Progress Notes (Signed)
CRITICAL VALUE ALERT  Critical Value:  Troponin 0.62  Date & Time Notied:  07/08/17 2205  Provider Notified: E-Link MD  Orders Received/Actions taken: No new orders at this time.

## 2017-07-08 NOTE — Progress Notes (Signed)
STROKE TEAM PROGRESS NOTE   SUBJECTIVE (INTERVAL HISTORY) His RN is at the bedside. Pt tolerated HD yesterday although BP was low. His BP much improved today. However, he lost his IV access, and IV team can not get peripheral IV. Since his BP is much improved, and he has fistula access during dialysis, and he is going to CIR soon, I am hesitant for a central line at this time.     OBJECTIVE Temp:  [97.7 F (36.5 C)-98.2 F (36.8 C)] 97.8 F (36.6 C) (04/24 0835) Pulse Rate:  [38-110] 69 (04/24 0835) Cardiac Rhythm: Normal sinus rhythm;Sinus bradycardia (04/24 0800) Resp:  [12-24] 19 (04/24 0900) BP: (77-145)/(55-94) 136/83 (04/24 0900) SpO2:  [95 %-100 %] 100 % (04/24 0900) Weight:  [193 lb 9 oz (87.8 kg)] 193 lb 9 oz (87.8 kg) (04/23 1138)  Recent Labs  Lab 07/05/17 2228 07/06/17 0158  GLUCAP 125* 97   Recent Labs  Lab 07/05/17 2215 07/05/17 2232 07/06/17 0510 07/07/17 0804 07/08/17 0406  NA 136 133* 133* 135 135  K 4.0 4.8 3.5 3.4* 3.7  CL 94* 98* 101 100* 96*  CO2 20*  --  18* 21* 21*  GLUCOSE 135* 130* 106* 89 86  BUN 39* 48* 35* 26* 16  CREATININE 14.46* 14.80* 13.49* 10.40* 9.17*  CALCIUM 10.7*  --  9.6 9.2 10.1  PHOS  --   --   --  4.5  --    Recent Labs  Lab 07/05/17 2215 07/07/17 0804  AST 15  --   ALT 9*  --   ALKPHOS 40  --   BILITOT 1.0  --   PROT 7.4  --   ALBUMIN 3.9 3.3*   Recent Labs  Lab 07/05/17 2215 07/05/17 2232 07/06/17 0510 07/07/17 0806 07/08/17 0406  WBC 14.8*  --  12.0* 7.5 8.5  NEUTROABS 12.7*  --  9.0*  --   --   HGB 13.2 12.6* 11.4* 11.7* 13.2  HCT 38.9* 37.0* 32.9* 34.9* 39.7  MCV 92.4  --  91.1 90.9 93.0  PLT 191  --  197 187 220   No results for input(s): CKTOTAL, CKMB, CKMBINDEX, TROPONINI in the last 168 hours. Recent Labs    07/05/17 2215  LABPROT 14.7  INR 1.16   No results for input(s): COLORURINE, LABSPEC, PHURINE, GLUCOSEU, HGBUR, BILIRUBINUR, KETONESUR, PROTEINUR, UROBILINOGEN, NITRITE, LEUKOCYTESUR in the  last 72 hours.  Invalid input(s): APPERANCEUR     Component Value Date/Time   CHOL 180 07/06/2017 0510   CHOL 208 (H) 03/19/2017 1152   TRIG 98 07/06/2017 0510   HDL 29 (L) 07/06/2017 0510   HDL 40 03/19/2017 1152   CHOLHDL 6.2 07/06/2017 0510   VLDL 20 07/06/2017 0510   LDLCALC 131 (H) 07/06/2017 0510   LDLCALC 139 (H) 03/19/2017 1152   Lab Results  Component Value Date   HGBA1C 4.6 (L) 07/06/2017      Component Value Date/Time   LABOPIA NONE DETECTED 07/07/2017 1752   COCAINSCRNUR NONE DETECTED 07/07/2017 1752   LABBENZ NONE DETECTED 07/07/2017 1752   AMPHETMU NONE DETECTED 07/07/2017 1752   THCU NONE DETECTED 07/07/2017 1752   LABBARB NONE DETECTED 07/07/2017 1752    No results for input(s): ETH in the last 168 hours.  I have personally reviewed the radiological images below and agree with the radiology interpretations.  Ct Angio Head W Or Wo Contrast  Result Date: 07/05/2017 CLINICAL DATA:  Right hemiparesis and difficulty speaking. EXAM: CT ANGIOGRAPHY HEAD AND NECK CT  PERFUSION BRAIN TECHNIQUE: Multidetector CT imaging of the head and neck was performed using the standard protocol during bolus administration of intravenous contrast. Multiplanar CT image reconstructions and MIPs were obtained to evaluate the vascular anatomy. Carotid stenosis measurements (when applicable) are obtained utilizing NASCET criteria, using the distal internal carotid diameter as the denominator. Multiphase CT imaging of the brain was performed following IV bolus contrast injection. Subsequent parametric perfusion maps were calculated using RAPID software. CONTRAST:  ISOVUE-370 IOPAMIDOL (ISOVUE-370) INJECTION 76% COMPARISON:  Head MRA 12/29/2013.  Neck MRA 06/30/2005 FINDINGS: CTA NECK FINDINGS Aortic arch: Standard 3 vessel aortic arch. Widely patent arch vessel origins. Right carotid system: Patent with predominantly soft plaque in the common carotid artery resulting in less than 50%  narrowing. Minimal calcified plaque at the carotid bifurcation. No ICA stenosis. Left carotid system: Patent with predominantly soft plaque in the distal common carotid artery resulting in 50% stenosis. Moderate calcified plaque about the carotid bifurcation. No ICA stenosis. Vertebral arteries: Patent with the left being slightly dominant. Mild proximal right V2 stenosis due to calcified plaque. Calcified and soft plaque in the left V1 segment with mild multifocal stenoses. Skeleton: Mild cervical disc degeneration. Other neck: Multiple thyroid nodules including a 2.9 cm exophytic. Nodule extending inferiorly from the isthmus. Upper chest: Clear lung apices. Review of the MIP images confirms the above findings CTA HEAD FINDINGS Anterior circulation: The internal carotid arteries are patent from skull base to carotid termini with calcified plaque resulting in mild cavernous and supraclinoid stenosis on the right. The M1 segments are patent with mild stenosis on the right. The left M2 inferior division is occluded at its origin with some reconstitution of more distal M2 and M3 branches, however there are numerous missing distal branches in the left parietal region corresponding to the acute infarct. The prior MRA also suggested a severe stenosis or possibly short segment occlusion of the left M2 inferior division, however motion artifact on that study makes it difficult to make a detailed assessment of interval changes. There are also likely some missing distal left MCA superior division branch vessels. There also severe proximal right M2 stenoses which are at least partially chronic. The right A1 segment is occluded. The left A1 segment is patent but demonstrates a severe proximal stenosis. Both A2 segments are heavily diseased with multifocal severe stenoses and or short segment occlusions. The appearance of the ACAs is grossly similar to the prior motion degraded MRA. No aneurysm. Posterior circulation: The  intracranial vertebral arteries are patent to the basilar. There is right greater than left V4 segment calcified plaque without significant stenosis. Patent SCA origins are identified bilaterally. PICA and AICA origins are not well evaluated. The basilar artery is widely patent. Posterior communicating arteries are diminutive or absent. PCAs are patent with mild left P2 stenosis. No aneurysm. Venous sinuses: Inadequately assessed due to arterial phase contrast timing. Anatomic variants: None. Delayed phase: Not performed. Review of the MIP images confirms the above findings CT Brain Perfusion Findings: CBF (<30%) Volume: 28mL Perfusion (Tmax>6.0s) volume: , however a substantial amount of this is located in the right cerebral hemisphere Mismatch Volume: though with the caveat that this is artifactually inflated due to (likely chronic) prolonged perfusion in the right cerebral hemisphere. There is still however an apparent sizable penumbra in the posterior left cerebral hemisphere. Infarction Location:Predominantly left parietal lobe with a small amount of posterior left frontal lobe, posterior left temporal lobe, and posterior insula involvement. IMPRESSION: 1. Left MCA infarct  predominantly involving the parietal lobe. 2. Left M2 inferior division occlusion at its origin with some distal reconstitution. Severe stenosis (or possibly short segment occlusion) in this location on a prior MRA. Numerous missing distal branch vessels in the left parietal region corresponding to the acute infarct. 3. Advanced intracranial atherosclerosis with numerous severe stenoses as above. 4. 50% left common carotid artery stenosis. 5.  Aortic Atherosclerosis (ICD10-I70.0). 6. Thyroid nodules measuring up to 2.9 cm. Nonemergent thyroid ultrasound recommended for further evaluation. These results were communicated to Dr. Amada Jupiter at 10:18 pm on 07/05/2017 by text page via the [facility] messaging system. Electronically Signed    By: Sebastian Ache M.D.   On: 07/05/2017 22:47   Ct Head Wo Contrast  Result Date: 07/06/2017 CLINICAL DATA:  Stroke follow-up EXAM: CT HEAD WITHOUT CONTRAST TECHNIQUE: Contiguous axial images were obtained from the base of the skull through the vertex without intravenous contrast. COMPARISON:  Brain MRI 07/06/2017 Head CT 07/06/2017 at 5:35 a.m. FINDINGS: Brain: Decreased density of material of the left basal ganglia, consistent with clearing of contrast staining. Right temporal encephalomalacia is unchanged. No acute hemorrhage or mass effect. No hydrocephalus. Vascular: Small amount of residual contrast material within the vessels. Skull: Normal visualized skull base, calvarium and extracranial soft tissues. Sinuses/Orbits: No sinus fluid levels or advanced mucosal thickening. No mastoid effusion. Normal orbits. IMPRESSION: Decreased density of hyperdense material at the left basal ganglia, consistent with clearing of contrast staining. No new abnormality. Electronically Signed   By: Deatra Robinson M.D.   On: 07/06/2017 23:24   Ct Head Wo Contrast  Result Date: 07/06/2017 CLINICAL DATA:  Left MCA infarct status post intervention EXAM: CT HEAD WITHOUT CONTRAST TECHNIQUE: Contiguous axial images were obtained from the base of the skull through the vertex without intravenous contrast. COMPARISON:  Cerebral angiogram 07/05/2017 CTA head neck 07/05/2017 FINDINGS: Brain: There is hyperdensity within the left basal ganglia, likely indicating contrast staining. No midline shift or mass effect. No hydrocephalus. Unchanged right temporal lobe encephalomalacia. Vascular: There is intravascular contrast material related to prior an angiographic study. Skull: Normal visualized skull base, calvarium and extracranial soft tissues. Sinuses/Orbits: No sinus fluid levels or advanced mucosal thickening. No mastoid effusion. Normal orbits. IMPRESSION: Hyperdensity of the left basal ganglia is favored to represent contrast  staining over petechial hemorrhage. Electronically Signed   By: Deatra Robinson M.D.   On: 07/06/2017 05:56   Ct Angio Neck W Or Wo Contrast  Result Date: 07/05/2017 CLINICAL DATA:  Right hemiparesis and difficulty speaking. EXAM: CT ANGIOGRAPHY HEAD AND NECK CT PERFUSION BRAIN TECHNIQUE: Multidetector CT imaging of the head and neck was performed using the standard protocol during bolus administration of intravenous contrast. Multiplanar CT image reconstructions and MIPs were obtained to evaluate the vascular anatomy. Carotid stenosis measurements (when applicable) are obtained utilizing NASCET criteria, using the distal internal carotid diameter as the denominator. Multiphase CT imaging of the brain was performed following IV bolus contrast injection. Subsequent parametric perfusion maps were calculated using RAPID software. CONTRAST:  ISOVUE-370 IOPAMIDOL (ISOVUE-370) INJECTION 76% COMPARISON:  Head MRA 12/29/2013.  Neck MRA 06/30/2005 FINDINGS: CTA NECK FINDINGS Aortic arch: Standard 3 vessel aortic arch. Widely patent arch vessel origins. Right carotid system: Patent with predominantly soft plaque in the common carotid artery resulting in less than 50% narrowing. Minimal calcified plaque at the carotid bifurcation. No ICA stenosis. Left carotid system: Patent with predominantly soft plaque in the distal common carotid artery resulting in 50% stenosis. Moderate calcified plaque about  the carotid bifurcation. No ICA stenosis. Vertebral arteries: Patent with the left being slightly dominant. Mild proximal right V2 stenosis due to calcified plaque. Calcified and soft plaque in the left V1 segment with mild multifocal stenoses. Skeleton: Mild cervical disc degeneration. Other neck: Multiple thyroid nodules including a 2.9 cm exophytic. Nodule extending inferiorly from the isthmus. Upper chest: Clear lung apices. Review of the MIP images confirms the above findings CTA HEAD FINDINGS Anterior circulation: The  internal carotid arteries are patent from skull base to carotid termini with calcified plaque resulting in mild cavernous and supraclinoid stenosis on the right. The M1 segments are patent with mild stenosis on the right. The left M2 inferior division is occluded at its origin with some reconstitution of more distal M2 and M3 branches, however there are numerous missing distal branches in the left parietal region corresponding to the acute infarct. The prior MRA also suggested a severe stenosis or possibly short segment occlusion of the left M2 inferior division, however motion artifact on that study makes it difficult to make a detailed assessment of interval changes. There are also likely some missing distal left MCA superior division branch vessels. There also severe proximal right M2 stenoses which are at least partially chronic. The right A1 segment is occluded. The left A1 segment is patent but demonstrates a severe proximal stenosis. Both A2 segments are heavily diseased with multifocal severe stenoses and or short segment occlusions. The appearance of the ACAs is grossly similar to the prior motion degraded MRA. No aneurysm. Posterior circulation: The intracranial vertebral arteries are patent to the basilar. There is right greater than left V4 segment calcified plaque without significant stenosis. Patent SCA origins are identified bilaterally. PICA and AICA origins are not well evaluated. The basilar artery is widely patent. Posterior communicating arteries are diminutive or absent. PCAs are patent with mild left P2 stenosis. No aneurysm. Venous sinuses: Inadequately assessed due to arterial phase contrast timing. Anatomic variants: None. Delayed phase: Not performed. Review of the MIP images confirms the above findings CT Brain Perfusion Findings: CBF (<30%) Volume: 28mL Perfusion (Tmax>6.0s) volume: , however a substantial amount of this is located in the right cerebral hemisphere Mismatch Volume:  though with the caveat that this is artifactually inflated due to (likely chronic) prolonged perfusion in the right cerebral hemisphere. There is still however an apparent sizable penumbra in the posterior left cerebral hemisphere. Infarction Location:Predominantly left parietal lobe with a small amount of posterior left frontal lobe, posterior left temporal lobe, and posterior insula involvement. IMPRESSION: 1. 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 2. Left M2 inferior division occlusion at its origin with some distal reconstitution. Severe stenosis (or possibly short segment occlusion) in this location on a prior MRA. Numerous missing distal branch vessels in the left parietal region corresponding to the acute infarct. 3. Advanced intracranial atherosclerosis with numerous severe stenoses as above. 4. 50% left common carotid artery stenosis. 5.  Aortic Atherosclerosis (ICD10-I70.0). 6. Thyroid nodules measuring up to 2.9 cm. Nonemergent thyroid ultrasound recommended for further evaluation. These results were communicated to Dr. Amada Jupiter at 10:18 pm on 07/05/2017 by text page via the [facility] messaging system. Electronically Signed   By: Sebastian Ache M.D.   On: 07/05/2017 22:47   Mr Maxine Glenn Head Wo Contrast  Result Date: 07/06/2017 CLINICAL DATA:  Stroke follow-up EXAM: MRI HEAD WITHOUT CONTRAST MRA HEAD WITHOUT CONTRAST TECHNIQUE: Multiplanar, multiecho pulse sequences of the brain and surrounding structures were obtained without intravenous contrast. Angiographic images  of the head were obtained using MRA technique without contrast. COMPARISON:  CT, CTA, and CTP 07/05/2017.  Brain MRI 04/27/2016 FINDINGS: MRI HEAD FINDINGS Brain: Cortically based restricted diffusion in the posterior left insula, superior temporal lobe, parietal lobe, and minimally involving the posterior left frontal lobe. There is good correlation with infarct by CT perfusion. Small remote infarcts in the left  superior cerebellum, left paramedian pons, left basal ganglia/internal capsule, and right temporal parietal cortex. There is chronic blood products at the remote left basal ganglia infarct. No hemorrhage seen at the acute infarct. No hydrocephalus or masslike finding. Vascular: Arterial findings below. Normal dural venous sinus flow voids. Skull and upper cervical spine: Negative Sinuses/Orbits: Negative MRA HEAD FINDINGS Symmetric carotid and vertebral arteries. The vertebral and basilar arteries are smooth and diffusely patent. Atheromatous irregularity of bilateral posterior cerebral arteries with high-grade left P2 segment narrowing. Essentially nonvisualized bilateral ACA attributed to advanced atheromatous narrowing based on prior CTA. Less robust flow signal seen in the left MCA with continued nonvisualization of the inferior division. The left superior division and right MCA vasculature show diffuse atheromatous irregularity. IMPRESSION: 1. Acute left MCA territory cortical infarct without detected progression when compared to CTP from yesterday. No hemorrhagic conversion. 2. Continued non visualization of the left MCA inferior division. 3. There is advanced intracranial atherosclerosis with nonvisualized ACAs and severe left P2 segment stenosis. 4. Remote small vessel infarcts as described. Electronically Signed   By: Marnee Spring M.D.   On: 07/06/2017 12:59   Mr Brain Wo Contrast  Result Date: 07/06/2017 CLINICAL DATA:  Stroke follow-up EXAM: MRI HEAD WITHOUT CONTRAST MRA HEAD WITHOUT CONTRAST TECHNIQUE: Multiplanar, multiecho pulse sequences of the brain and surrounding structures were obtained without intravenous contrast. Angiographic images of the head were obtained using MRA technique without contrast. COMPARISON:  CT, CTA, and CTP 07/05/2017.  Brain MRI 04/27/2016 FINDINGS: MRI HEAD FINDINGS Brain: Cortically based restricted diffusion in the posterior left insula, superior temporal lobe,  parietal lobe, and minimally involving the posterior left frontal lobe. There is good correlation with infarct by CT perfusion. Small remote infarcts in the left superior cerebellum, left paramedian pons, left basal ganglia/internal capsule, and right temporal parietal cortex. There is chronic blood products at the remote left basal ganglia infarct. No hemorrhage seen at the acute infarct. No hydrocephalus or masslike finding. Vascular: Arterial findings below. Normal dural venous sinus flow voids. Skull and upper cervical spine: Negative Sinuses/Orbits: Negative MRA HEAD FINDINGS Symmetric carotid and vertebral arteries. The vertebral and basilar arteries are smooth and diffusely patent. Atheromatous irregularity of bilateral posterior cerebral arteries with high-grade left P2 segment narrowing. Essentially nonvisualized bilateral ACA attributed to advanced atheromatous narrowing based on prior CTA. Less robust flow signal seen in the left MCA with continued nonvisualization of the inferior division. The left superior division and right MCA vasculature show diffuse atheromatous irregularity. IMPRESSION: 1. Acute left MCA territory cortical infarct without detected progression when compared to CTP from yesterday. No hemorrhagic conversion. 2. Continued non visualization of the left MCA inferior division. 3. There is advanced intracranial atherosclerosis with nonvisualized ACAs and severe left P2 segment stenosis. 4. Remote small vessel infarcts as described. Electronically Signed   By: Marnee Spring M.D.   On: 07/06/2017 12:59   Ir Ct Head Ltd  Result Date: 07/07/2017 INDICATION: Flaccid right and arm leg. Left gaze deviation. Dysarthria. Occluded inferior division of the left middle cerebral artery. EXAM: 1. EMERGENT LARGE VESSEL OCCLUSION THROMBOLYSIS (anterior CIRCULATION) COMPARISON:  CT angiogram  of the head and neck of 07/05/2017. MEDICATIONS: Ancef 2 g IV antibiotic was administered within 1 hour of the  procedure. ANESTHESIA/SEDATION: General anesthesia. CONTRAST:  Isovue 300 approximately 70 cc. FLUOROSCOPY TIME:  Fluoroscopy Time: 27 minutes 12 seconds (2951 mGy). COMPLICATIONS: None immediate. TECHNIQUE: Following a full explanation of the procedure along with the potential associated complications, an informed witnessed consent was obtained. The risks of intracranial hemorrhage of 10%, worsening neurological deficit, ventilator dependency, death and inability to revascularize were all reviewed in detail with the patient's family. The patient was then put under general anesthesia by the Department of Anesthesiology at [facility]. The right groin was prepped and draped in the usual sterile fashion. Thereafter using modified Seldinger technique, transfemoral access into the right common femoral artery was obtained without difficulty. Over a 0.035 inch guidewire a 5 French Pinnacle sheath was inserted. Through this, and also over a 0.035 inch guidewire a 5 [place] JB 1 catheter was advanced to the aortic arch region and selectively positioned in the right common carotid artery, the right vertebral artery, the left vertebral artery and the left common carotid artery. FINDINGS: The left common carotid arteriogram demonstrates approximately 50% stenosis of the mid segment of the left common carotid artery. The left external carotid artery and its major branches are widely patent. The left internal carotid artery at the bulb demonstrates wide patency. More distally, there is mild decreased caliber of the left internal carotid artery in its entirety. The petrous, cavernous and supraclinoid segments demonstrate wide patency. The left anterior cerebral artery demonstrates complete occlusion distal to the left A1 segment. This is associated with caliber irregularity in the A1 segment. The left middle cerebral artery M1 segment proximally demonstrates mild caliber irregularity. There is patency of the superior  division of the left middle cerebral artery with delayed reconstitution of the distal inferior division of the left middle cerebral artery in the M2 M3 regions. Distal to this the M3 branches and the M4 branches demonstrated gradual ascent of contrast. The inferior division appears to be completely occluded associated with a stump. A diminutive anterior temporal branch is noted. A prominent anterior choroidal artery on the left is noted with delayed arterial blush noted in the region of the splenium of the corpus callosum. The whole head delayed arterial phase in the lateral projection demonstrates a prominent area of hypoperfusion involving the left parietal cortical and subcortical regions. The left vertebral artery origin is from the thyrocervical trunk. The vessel is seen to opacify to the cranial skull base. Wide patency is seen of the left vertebrobasilar junction and the left posterior inferior cerebellar artery. The basilar artery, the posterior cerebral arteries, the superior cerebellar arteries and the anterior-inferior cerebellar arteries opacify into the capillary and venous phases. Prominent collaterals are seen in the P3 region of the left posterior cerebral artery retrogradely filling the posterior 1/3 of the left parietal cortical and subcortical areas. The delayed arterial phase then demonstrates retrograde opacification of the distal pericallosal artery in the splenium of the corpus callosum from the lateral thalamo perforator branches. Also demonstrated is retrograde opacification of the posterior half of the anterior temporal region from the posterior temporal branches of the P1 P2 region of the left posterior cerebral artery. The right common carotid arteriogram demonstrates mild narrowing in the mid third of the right common carotid artery. The right external carotid artery and its major branches are widely patent. The right internal carotid artery at the bulb to the cranial skull  base is seen  to opacify unimpeded. The petrous segment is normal. There is mild narrowing of the caval cavernous segment of the right internal carotid artery. Distal to this the supraclinoid segment demonstrates mild decrease in caliber. The right middle cerebral artery demonstrates a 50% stenosis in the distal right M1 segment. The proximal superior and the inferior divisions demonstrate moderate stenosis. Again demonstrated is a prominent anterior choroidal artery on the right side. The delayed arterial phase demonstrates hypoperfusion of the parietal cortical and subcortical regions. The right vertebral artery origin is from the right thyrocervical trunk. Again this vessel is seen to opacify to the cranial skull base. Wide patency is seen of the right vertebrobasilar junction and the right posterior-inferior cerebellar artery. Multiple focal areas of caliber irregularity are seen involving the posterior-inferior cerebellar artery. The opacified portion of the basilar artery, the proximal posterior cerebral arteries, the superior cerebellar arteries and the anterior-inferior cerebellar arteries are seen to opacify into the capillary and venous phases. Extensive collaterals are again seen from the P3 regions of both posterior cerebral arteries contributing to the posterior parietal middle cerebral artery distributions bilaterally as described earlier. PROCEDURE: ENDOVASCULAR TREATMENT OF OCCLUDED DOMINANT INFERIOR DIVISION OF THE LEFT MIDDLE CEREBRAL ARTERY The diagnostic JB 1 catheter in the left common carotid artery was exchanged over a 0.035 inch 8 French 55 cm Brite tip neurovascular sheath using biplane roadmap technique and constant fluoroscopic guidance. Good aspiration obtained from the hub of the 8 [place] Brite tip neurovascular sheath. A gentle contrast injection demonstrated no evidence of spasms, dissections or of intraluminal filling defects. This was then connected to continuous heparinized saline infusion. Over  the [facility] exchange guidewire, an 85 cm 8 [place] of balloon FlowGate guide catheter which had been prepped with 50% contrast and 50% heparinized saline infusion was advanced and positioned just proximal to the left common carotid bifurcation. The guidewire was removed. Good aspiration was obtained at the hub of the [facility] guide catheter. Over a 0.035 inch Roadrunner guidewire, using biplane roadmap technique and constant fluoroscopic guidance, the FlowGate guide catheter was then advanced to the mid cervical left ICA. The guidewire was removed. Again good aspiration was obtained from the hub of the 8 [place] FlowGate guide catheter. A gentle control arteriogram performed centered extracranially and intracranially demonstrated no change in the intracranial circulation. Over a 0.014 inch Softip Synchro micro guidewire, a Trevo ProVue 021 microcatheter was advanced to the distal end of the of FlowGate guide catheter. With the micro guidewire leading with a J-tip configuration, the combination was navigated to the supraclinoid left ICA. The micro guidewire was then advanced gently into the inferior division origin followed by the microcatheter. A gentle manipulation of the micro guidewire was then performed using a torque device to encourage advancement through the occluded left middle cerebral artery. The microcatheter was then brought into close proximity to the site of the occlusion. Further manipulation was performed with partial advancement of the micro guidewire through the occluded left middle cerebral artery inferior division. To avoid potential perforation of the more distal inferior division the micro guidewire was retrieved. Good aspiration obtained from the hub of the microcatheter. Approximately 3.6 mg of super selective intracranial intra-arterial Integrilin was infused over a couple of minutes. Thereafter, further attempts were made to encourage advancement of a very soft microguidewire again with only  partial success. 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 The procedure was then stopped. The final control arteriogram performed through the [facility] guide  catheter in the left internal carotid artery continued to demonstrate no significant change in the intracranial circulation. No angiographic evidence of extravasation or mass-effect or midline shift was seen. A Dyna CT of the head performed revealed no evidence of hemorrhage or change in the ventricular size or mass effect or midline shift. The [facility] guide catheter and the neurovascular sheath were then exchanged over a J-tip 0.035 guidewire for an 8 [place] Pinnacle sheath. This was then removed with successful hemostasis being achieved by manual compression. At the end of the procedure, the patient's right groin appeared soft without evidence of bleeding or hematoma. Distal pulses continued to be palpable in the dorsalis pedis, and the posterior tibial regions unchanged from prior to the procedure. The patient's general anesthesia was then reversed. The patient was extubated without difficulty. Upon recovery the patient was noted to be able to raise his right arm, and also bend his right knee. He responded appropriately to simple commands. He was then transferred to the PACU and then to neuro ICU for further management. IMPRESSION: Status post attempted revascularization of occluded dominant inferior division of the right middle cerebral artery using 3.6 mg of super selective intracranial intra-arterial Integrilin and mechanical thrombolysis with a micro guidewire. PLAN: Further management as per stroke team. Electronically Signed   By: Julieanne Cotton M.D.   On: 07/06/2017 10:05   Ct Cerebral Perfusion W Contrast  Result Date: 07/05/2017 CLINICAL DATA:  Right hemiparesis and difficulty speaking. EXAM: CT ANGIOGRAPHY HEAD AND NECK CT PERFUSION BRAIN TECHNIQUE: Multidetector CT imaging of the head and neck was performed using the  standard protocol during bolus administration of intravenous contrast. Multiplanar CT image reconstructions and MIPs were obtained to evaluate the vascular anatomy. Carotid stenosis measurements (when applicable) are obtained utilizing NASCET criteria, using the distal internal carotid diameter as the denominator. Multiphase CT imaging of the brain was performed following IV bolus contrast injection. Subsequent parametric perfusion maps were calculated using RAPID software. CONTRAST:  ISOVUE-370 IOPAMIDOL (ISOVUE-370) INJECTION 76% COMPARISON:  Head MRA 12/29/2013.  Neck MRA 06/30/2005 FINDINGS: CTA NECK FINDINGS Aortic arch: Standard 3 vessel aortic arch. Widely patent arch vessel origins. Right carotid system: Patent with predominantly soft plaque in the common carotid artery resulting in less than 50% narrowing. Minimal calcified plaque at the carotid bifurcation. No ICA stenosis. Left carotid system: Patent with predominantly soft plaque in the distal common carotid artery resulting in 50% stenosis. Moderate calcified plaque about the carotid bifurcation. No ICA stenosis. Vertebral arteries: Patent with the left being slightly dominant. Mild proximal right V2 stenosis due to calcified plaque. Calcified and soft plaque in the left V1 segment with mild multifocal stenoses. Skeleton: Mild cervical disc degeneration. Other neck: Multiple thyroid nodules including a 2.9 cm exophytic. Nodule extending inferiorly from the isthmus. Upper chest: Clear lung apices. Review of the MIP images confirms the above findings CTA HEAD FINDINGS Anterior circulation: The internal carotid arteries are patent from skull base to carotid termini with calcified plaque resulting in mild cavernous and supraclinoid stenosis on the right. The M1 segments are patent with mild stenosis on the right. The left M2 inferior division is occluded at its origin with some reconstitution of more distal M2 and M3 branches, however there are  numerous missing distal branches in the left parietal region corresponding to the acute infarct. The prior MRA also suggested a severe stenosis or possibly short segment occlusion of the left M2 inferior division, however motion artifact on that study makes it difficult  to make a detailed assessment of interval changes. There are also likely some missing distal left MCA superior division branch vessels. There also severe proximal right M2 stenoses which are at least partially chronic. The right A1 segment is occluded. The left A1 segment is patent but demonstrates a severe proximal stenosis. Both A2 segments are heavily diseased with multifocal severe stenoses and or short segment occlusions. The appearance of the ACAs is grossly similar to the prior motion degraded MRA. No aneurysm. Posterior circulation: The intracranial vertebral arteries are patent to the basilar. There is right greater than left V4 segment calcified plaque without significant stenosis. Patent SCA origins are identified bilaterally. PICA and AICA origins are not well evaluated. The basilar artery is widely patent. Posterior communicating arteries are diminutive or absent. PCAs are patent with mild left P2 stenosis. No aneurysm. Venous sinuses: Inadequately assessed due to arterial phase contrast timing. Anatomic variants: None. Delayed phase: Not performed. Review of the MIP images confirms the above findings CT Brain Perfusion Findings: CBF (<30%) Volume: 28mL Perfusion (Tmax>6.0s) volume: , however a substantial amount of this is located in the right cerebral hemisphere Mismatch Volume: though with the caveat that this is artifactually inflated due to (likely chronic) prolonged perfusion in the right cerebral hemisphere. There is still however an apparent sizable penumbra in the posterior left cerebral hemisphere. Infarction Location:Predominantly left parietal lobe with a small amount of posterior left frontal lobe, posterior left  temporal lobe, and posterior insula involvement. IMPRESSION: 1. 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 2. Left M2 inferior division occlusion at its origin with some distal reconstitution. Severe stenosis (or possibly short segment occlusion) in this location on a prior MRA. Numerous missing distal branch vessels in the left parietal region corresponding to the acute infarct. 3. Advanced intracranial atherosclerosis with numerous severe stenoses as above. 4. 50% left common carotid artery stenosis. 5.  Aortic Atherosclerosis (ICD10-I70.0). 6. Thyroid nodules measuring up to 2.9 cm. Nonemergent thyroid ultrasound recommended for further evaluation. These results were communicated to Dr. Amada Jupiter at 10:18 pm on 07/05/2017 by text page via the [facility] messaging system. Electronically Signed   By: Sebastian Ache M.D.   On: 07/05/2017 22:47   Dg Chest Port 1 View  Result Date: 07/06/2017 CLINICAL DATA:  Cardiomyopathy, coronary artery disease with stent placement, chronic renal insufficiency. EXAM: PORTABLE CHEST 1 VIEW COMPARISON:  Chest x-ray of March 04, 2017 FINDINGS: The lungs are borderline hypoinflated but clear. The heart is top-normal in size. The pulmonary vascularity is normal. There is no pleural effusion. There are post CABG changes. There is a vascular graft in the left axillary region. The bony thorax exhibits no acute abnormality. IMPRESSION: Top-normal cardiac size without pulmonary edema or pleural effusion. No pneumonia. Electronically Signed   By: David  [place] M.D.   On: 07/06/2017 07:57   Ir Percutaneous Art Thrombectomy/infusion Intracranial Inc Diag Angio  Result Date: 07/07/2017 INDICATION: Flaccid right and arm leg. Left gaze deviation. Dysarthria. Occluded inferior division of the left middle cerebral artery. EXAM: 1. EMERGENT LARGE VESSEL OCCLUSION THROMBOLYSIS (anterior CIRCULATION) COMPARISON:  CT angiogram of the head and neck of 07/05/2017. MEDICATIONS: Ancef  2 g IV antibiotic was administered within 1 hour of the procedure. ANESTHESIA/SEDATION: General anesthesia. CONTRAST:  Isovue 300 approximately 70 cc. FLUOROSCOPY TIME:  Fluoroscopy Time: 27 minutes 12 seconds (2951 mGy). COMPLICATIONS: None immediate. TECHNIQUE: Following a full explanation of the procedure along with the potential associated complications, an informed witnessed consent was obtained. The  risks of intracranial hemorrhage of 10%, worsening neurological deficit, ventilator dependency, death and inability to revascularize were all reviewed in detail with the patient's family. The patient was then put under general anesthesia by the Department of Anesthesiology at [facility]. The right groin was prepped and draped in the usual sterile fashion. Thereafter using modified Seldinger technique, transfemoral access into the right common femoral artery was obtained without difficulty. Over a 0.035 inch guidewire a 5 French Pinnacle sheath was inserted. Through this, and also over a 0.035 inch guidewire a 5 [place] JB 1 catheter was advanced to the aortic arch region and selectively positioned in the right common carotid artery, the right vertebral artery, the left vertebral artery and the left common carotid artery. FINDINGS: The left common carotid arteriogram demonstrates approximately 50% stenosis of the mid segment of the left common carotid artery. The left external carotid artery and its major branches are widely patent. The left internal carotid artery at the bulb demonstrates wide patency. More distally, there is mild decreased caliber of the left internal carotid artery in its entirety. The petrous, cavernous and supraclinoid segments demonstrate wide patency. The left anterior cerebral artery demonstrates complete occlusion distal to the left A1 segment. This is associated with caliber irregularity in the A1 segment. The left middle cerebral artery M1 segment proximally demonstrates mild  caliber irregularity. There is patency of the superior division of the left middle cerebral artery with delayed reconstitution of the distal inferior division of the left middle cerebral artery in the M2 M3 regions. Distal to this the M3 branches and the M4 branches demonstrated gradual ascent of contrast. The inferior division appears to be completely occluded associated with a stump. A diminutive anterior temporal branch is noted. A prominent anterior choroidal artery on the left is noted with delayed arterial blush noted in the region of the splenium of the corpus callosum. The whole head delayed arterial phase in the lateral projection demonstrates a prominent area of hypoperfusion involving the left parietal cortical and subcortical regions. The left vertebral artery origin is from the thyrocervical trunk. The vessel is seen to opacify to the cranial skull base. Wide patency is seen of the left vertebrobasilar junction and the left posterior inferior cerebellar artery. The basilar artery, the posterior cerebral arteries, the superior cerebellar arteries and the anterior-inferior cerebellar arteries opacify into the capillary and venous phases. Prominent collaterals are seen in the P3 region of the left posterior cerebral artery retrogradely filling the posterior 1/3 of the left parietal cortical and subcortical areas. The delayed arterial phase then demonstrates retrograde opacification of the distal pericallosal artery in the splenium of the corpus callosum from the lateral thalamo perforator branches. Also demonstrated is retrograde opacification of the posterior half of the anterior temporal region from the posterior temporal branches of the P1 P2 region of the left posterior cerebral artery. The right common carotid arteriogram demonstrates mild narrowing in the mid third of the right common carotid artery. The right external carotid artery and its major branches are widely patent. The right internal  carotid artery at the bulb to the cranial skull base is seen to opacify unimpeded. The petrous segment is normal. There is mild narrowing of the caval cavernous segment of the right internal carotid artery. Distal to this the supraclinoid segment demonstrates mild decrease in caliber. The right middle cerebral artery demonstrates a 50% stenosis in the distal right M1 segment. The proximal superior and the inferior divisions demonstrate moderate stenosis. Again demonstrated is a  prominent anterior choroidal artery on the right side. The delayed arterial phase demonstrates hypoperfusion of the parietal cortical and subcortical regions. The right vertebral artery origin is from the right thyrocervical trunk. Again this vessel is seen to opacify to the cranial skull base. Wide patency is seen of the right vertebrobasilar junction and the right posterior-inferior cerebellar artery. Multiple focal areas of caliber irregularity are seen involving the posterior-inferior cerebellar artery. The opacified portion of the basilar artery, the proximal posterior cerebral arteries, the superior cerebellar arteries and the anterior-inferior cerebellar arteries are seen to opacify into the capillary and venous phases. Extensive collaterals are again seen from the P3 regions of both posterior cerebral arteries contributing to the posterior parietal middle cerebral artery distributions bilaterally as described earlier. PROCEDURE: ENDOVASCULAR TREATMENT OF OCCLUDED DOMINANT INFERIOR DIVISION OF THE LEFT MIDDLE CEREBRAL ARTERY The diagnostic JB 1 catheter in the left common carotid artery was exchanged over a 0.035 inch 8 French 55 cm Brite tip neurovascular sheath using biplane roadmap technique and constant fluoroscopic guidance. Good aspiration obtained from the hub of the 8 [place] Brite tip neurovascular sheath. A gentle contrast injection demonstrated no evidence of spasms, dissections or of intraluminal filling defects. This was  then connected to continuous heparinized saline infusion. Over the [facility] exchange guidewire, an 85 cm 8 [place] of balloon FlowGate guide catheter which had been prepped with 50% contrast and 50% heparinized saline infusion was advanced and positioned just proximal to the left common carotid bifurcation. The guidewire was removed. Good aspiration was obtained at the hub of the [facility] guide catheter. Over a 0.035 inch Roadrunner guidewire, using biplane roadmap technique and constant fluoroscopic guidance, the FlowGate guide catheter was then advanced to the mid cervical left ICA. The guidewire was removed. Again good aspiration was obtained from the hub of the 8 [place] FlowGate guide catheter. A gentle control arteriogram performed centered extracranially and intracranially demonstrated no change in the intracranial circulation. Over a 0.014 inch Softip Synchro micro guidewire, a Trevo ProVue 021 microcatheter was advanced to the distal end of the of FlowGate guide catheter. With the micro guidewire leading with a J-tip configuration, the combination was navigated to the supraclinoid left ICA. The micro guidewire was then advanced gently into the inferior division origin followed by the microcatheter. A gentle manipulation of the micro guidewire was then performed using a torque device to encourage advancement through the occluded left middle cerebral artery. The microcatheter was then brought into close proximity to the site of the occlusion. Further manipulation was performed with partial advancement of the micro guidewire through the occluded left middle cerebral artery inferior division. To avoid potential perforation of the more distal inferior division the micro guidewire was retrieved. Good aspiration obtained from the hub of the microcatheter. Approximately 3.6 mg of super selective intracranial intra-arterial Integrilin was infused over a couple of minutes. Thereafter, further attempts were made to  encourage advancement of a very soft microguidewire again with only partial success. 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 The procedure was then stopped. The final control arteriogram performed through the [facility] guide catheter in the left internal carotid artery continued to demonstrate no significant change in the intracranial circulation. No angiographic evidence of extravasation or mass-effect or midline shift was seen. A Dyna CT of the head performed revealed no evidence of hemorrhage or change in the ventricular size or mass effect or midline shift. The [facility] guide catheter and the neurovascular sheath were then exchanged over a J-tip  0.035 guidewire for an 8 [place] Pinnacle sheath. This was then removed with successful hemostasis being achieved by manual compression. At the end of the procedure, the patient's right groin appeared soft without evidence of bleeding or hematoma. Distal pulses continued to be palpable in the dorsalis pedis, and the posterior tibial regions unchanged from prior to the procedure. The patient's general anesthesia was then reversed. The patient was extubated without difficulty. Upon recovery the patient was noted to be able to raise his right arm, and also bend his right knee. He responded appropriately to simple commands. He was then transferred to the PACU and then to neuro ICU for further management. IMPRESSION: Status post attempted revascularization of occluded dominant inferior division of the right middle cerebral artery using 3.6 mg of super selective intracranial intra-arterial Integrilin and mechanical thrombolysis with a micro guidewire. PLAN: Further management as per stroke team. Electronically Signed   By: Julieanne Cotton M.D.   On: 07/06/2017 10:05   Ct Head Code Stroke Wo Contrast  Result Date: 07/05/2017 CLINICAL DATA:  Code stroke. Right hemiparesis and difficulty speaking. EXAM: CT HEAD WITHOUT CONTRAST TECHNIQUE: Contiguous axial  images were obtained from the base of the skull through the vertex without intravenous contrast. COMPARISON:  Brain MRI 04/27/2016 FINDINGS: Brain: There is loss of gray-white differentiation consistent with acute infarction involving the posterior left insula and left parietal lobe extending towards the vertex. No acute intracranial hemorrhage, mass, midline shift, or extra-axial fluid collection is identified. Chronic lacunar infarcts are present in the basal ganglia and thalami bilaterally. There is a chronic right temporoparietal infarct. There is a small chronic left cerebellar infarct. Patchy cerebral white matter hypodensities are nonspecific but compatible with chronic small vessel ischemic disease, moderately advanced for age. Mild cerebral atrophy is advanced for age. Vascular: Calcified atherosclerosis at the skull base. No hyperdense vessel. Skull: No fracture or focal osseous lesion. Sinuses/Orbits: Visualized paranasal sinuses and mastoid air cells are clear. Orbits are unremarkable. Other: None. ASPECTS [facility] Stroke Program Early CT Score) - Ganglionic level infarction (caudate, lentiform nuclei, internal capsule, insula, M1-M3 cortex): 5-6 - Supraganglionic infarction (M4-M6 cortex): 2 Total score (0-10 with 10 being normal): 7-8 IMPRESSION: 1. Acute left MCA infarct involving the insula and parietal lobe. No hemorrhage. 2. ASPECTS is 7-8. 3. Age advanced chronic ischemia. These results were communicated to Dr. Amada Jupiter at 9:54 pm on 07/05/2017 by text page via the [facility] messaging system. Electronically Signed   By: Sebastian Ache M.D.   On: 07/05/2017 21:59   Ir Angio Intra Extracran Sel Com Carotid Innominate Uni R Mod Sed  Result Date: 07/07/2017 INDICATION: Flaccid right and arm leg. Left gaze deviation. Dysarthria. Occluded inferior division of the left middle cerebral artery. EXAM: 1. EMERGENT LARGE VESSEL OCCLUSION THROMBOLYSIS (anterior CIRCULATION) COMPARISON:  CT angiogram of the  head and neck of 07/05/2017. MEDICATIONS: Ancef 2 g IV antibiotic was administered within 1 hour of the procedure. ANESTHESIA/SEDATION: General anesthesia. CONTRAST:  Isovue 300 approximately 70 cc. FLUOROSCOPY TIME:  Fluoroscopy Time: 27 minutes 12 seconds (2951 mGy). COMPLICATIONS: None immediate. TECHNIQUE: Following a full explanation of the procedure along with the potential associated complications, an informed witnessed consent was obtained. The risks of intracranial hemorrhage of 10%, worsening neurological deficit, ventilator dependency, death and inability to revascularize were all reviewed in detail with the patient's family. The patient was then put under general anesthesia by the Department of Anesthesiology at [facility]. The right groin was prepped and draped in the usual  sterile fashion. Thereafter using modified Seldinger technique, transfemoral access into the right common femoral artery was obtained without difficulty. Over a 0.035 inch guidewire a 5 French Pinnacle sheath was inserted. Through this, and also over a 0.035 inch guidewire a 5 [place] JB 1 catheter was advanced to the aortic arch region and selectively positioned in the right common carotid artery, the right vertebral artery, the left vertebral artery and the left common carotid artery. FINDINGS: The left common carotid arteriogram demonstrates approximately 50% stenosis of the mid segment of the left common carotid artery. The left external carotid artery and its major branches are widely patent. The left internal carotid artery at the bulb demonstrates wide patency. More distally, there is mild decreased caliber of the left internal carotid artery in its entirety. The petrous, cavernous and supraclinoid segments demonstrate wide patency. The left anterior cerebral artery demonstrates complete occlusion distal to the left A1 segment. This is associated with caliber irregularity in the A1 segment. The left middle cerebral  artery M1 segment proximally demonstrates mild caliber irregularity. There is patency of the superior division of the left middle cerebral artery with delayed reconstitution of the distal inferior division of the left middle cerebral artery in the M2 M3 regions. Distal to this the M3 branches and the M4 branches demonstrated gradual ascent of contrast. The inferior division appears to be completely occluded associated with a stump. A diminutive anterior temporal branch is noted. A prominent anterior choroidal artery on the left is noted with delayed arterial blush noted in the region of the splenium of the corpus callosum. The whole head delayed arterial phase in the lateral projection demonstrates a prominent area of hypoperfusion involving the left parietal cortical and subcortical regions. The left vertebral artery origin is from the thyrocervical trunk. The vessel is seen to opacify to the cranial skull base. Wide patency is seen of the left vertebrobasilar junction and the left posterior inferior cerebellar artery. The basilar artery, the posterior cerebral arteries, the superior cerebellar arteries and the anterior-inferior cerebellar arteries opacify into the capillary and venous phases. Prominent collaterals are seen in the P3 region of the left posterior cerebral artery retrogradely filling the posterior 1/3 of the left parietal cortical and subcortical areas. The delayed arterial phase then demonstrates retrograde opacification of the distal pericallosal artery in the splenium of the corpus callosum from the lateral thalamo perforator branches. Also demonstrated is retrograde opacification of the posterior half of the anterior temporal region from the posterior temporal branches of the P1 P2 region of the left posterior cerebral artery. The right common carotid arteriogram demonstrates mild narrowing in the mid third of the right common carotid artery. The right external carotid artery and its major  branches are widely patent. The right internal carotid artery at the bulb to the cranial skull base is seen to opacify unimpeded. The petrous segment is normal. There is mild narrowing of the caval cavernous segment of the right internal carotid artery. Distal to this the supraclinoid segment demonstrates mild decrease in caliber. The right middle cerebral artery demonstrates a 50% stenosis in the distal right M1 segment. The proximal superior and the inferior divisions demonstrate moderate stenosis. Again demonstrated is a prominent anterior choroidal artery on the right side. The delayed arterial phase demonstrates hypoperfusion of the parietal cortical and subcortical regions. The right vertebral artery origin is from the right thyrocervical trunk. Again this vessel is seen to opacify to the cranial skull base. Wide patency is seen of the right vertebrobasilar  junction and the right posterior-inferior cerebellar artery. Multiple focal areas of caliber irregularity are seen involving the posterior-inferior cerebellar artery. The opacified portion of the basilar artery, the proximal posterior cerebral arteries, the superior cerebellar arteries and the anterior-inferior cerebellar arteries are seen to opacify into the capillary and venous phases. Extensive collaterals are again seen from the P3 regions of both posterior cerebral arteries contributing to the posterior parietal middle cerebral artery distributions bilaterally as described earlier. PROCEDURE: ENDOVASCULAR TREATMENT OF OCCLUDED DOMINANT INFERIOR DIVISION OF THE LEFT MIDDLE CEREBRAL ARTERY The diagnostic JB 1 catheter in the left common carotid artery was exchanged over a 0.035 inch 8 French 55 cm Brite tip neurovascular sheath using biplane roadmap technique and constant fluoroscopic guidance. Good aspiration obtained from the hub of the 8 [place] Brite tip neurovascular sheath. A gentle contrast injection demonstrated no evidence of spasms, dissections  or of intraluminal filling defects. This was then connected to continuous heparinized saline infusion. Over the [facility] exchange guidewire, an 85 cm 8 [place] of balloon FlowGate guide catheter which had been prepped with 50% contrast and 50% heparinized saline infusion was advanced and positioned just proximal to the left common carotid bifurcation. The guidewire was removed. Good aspiration was obtained at the hub of the [facility] guide catheter. Over a 0.035 inch Roadrunner guidewire, using biplane roadmap technique and constant fluoroscopic guidance, the FlowGate guide catheter was then advanced to the mid cervical left ICA. The guidewire was removed. Again good aspiration was obtained from the hub of the 8 [place] FlowGate guide catheter. A gentle control arteriogram performed centered extracranially and intracranially demonstrated no change in the intracranial circulation. Over a 0.014 inch Softip Synchro micro guidewire, a Trevo ProVue 021 microcatheter was advanced to the distal end of the of FlowGate guide catheter. With the micro guidewire leading with a J-tip configuration, the combination was navigated to the supraclinoid left ICA. The micro guidewire was then advanced gently into the inferior division origin followed by the microcatheter. A gentle manipulation of the micro guidewire was then performed using a torque device to encourage advancement through the occluded left middle cerebral artery. The microcatheter was then brought into close proximity to the site of the occlusion. Further manipulation was performed with partial advancement of the micro guidewire through the occluded left middle cerebral artery inferior division. To avoid potential perforation of the more distal inferior division the micro guidewire was retrieved. Good aspiration obtained from the hub of the microcatheter. Approximately 3.6 mg of super selective intracranial intra-arterial Integrilin was infused over a couple of minutes.  Thereafter, further attempts were made to encourage advancement of a very soft microguidewire again with only partial success. 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 The procedure was then stopped. The final control arteriogram performed through the [facility] guide catheter in the left internal carotid artery continued to demonstrate no significant change in the intracranial circulation. No angiographic evidence of extravasation or mass-effect or midline shift was seen. A Dyna CT of the head performed revealed no evidence of hemorrhage or change in the ventricular size or mass effect or midline shift. The [facility] guide catheter and the neurovascular sheath were then exchanged over a J-tip 0.035 guidewire for an 8 [place] Pinnacle sheath. This was then removed with successful hemostasis being achieved by manual compression. At the end of the procedure, the patient's right groin appeared soft without evidence of bleeding or hematoma. Distal pulses continued to be palpable in the dorsalis pedis, and the posterior tibial  regions unchanged from prior to the procedure. The patient's general anesthesia was then reversed. The patient was extubated without difficulty. Upon recovery the patient was noted to be able to raise his right arm, and also bend his right knee. He responded appropriately to simple commands. He was then transferred to the PACU and then to neuro ICU for further management. IMPRESSION: Status post attempted revascularization of occluded dominant inferior division of the right middle cerebral artery using 3.6 mg of super selective intracranial intra-arterial Integrilin and mechanical thrombolysis with a micro guidewire. PLAN: Further management as per stroke team. Electronically Signed   By: Julieanne Cotton M.D.   On: 07/06/2017 10:05   Ir Angio Vertebral Sel Vertebral Bilat Mod Sed  Result Date: 07/07/2017 INDICATION: Flaccid right and arm leg. Left gaze deviation. Dysarthria.  Occluded inferior division of the left middle cerebral artery. EXAM: 1. EMERGENT LARGE VESSEL OCCLUSION THROMBOLYSIS (anterior CIRCULATION) COMPARISON:  CT angiogram of the head and neck of 07/05/2017. MEDICATIONS: Ancef 2 g IV antibiotic was administered within 1 hour of the procedure. ANESTHESIA/SEDATION: General anesthesia. CONTRAST:  Isovue 300 approximately 70 cc. FLUOROSCOPY TIME:  Fluoroscopy Time: 27 minutes 12 seconds (2951 mGy). COMPLICATIONS: None immediate. TECHNIQUE: Following a full explanation of the procedure along with the potential associated complications, an informed witnessed consent was obtained. The risks of intracranial hemorrhage of 10%, worsening neurological deficit, ventilator dependency, death and inability to revascularize were all reviewed in detail with the patient's family. The patient was then put under general anesthesia by the Department of Anesthesiology at [facility]. The right groin was prepped and draped in the usual sterile fashion. Thereafter using modified Seldinger technique, transfemoral access into the right common femoral artery was obtained without difficulty. Over a 0.035 inch guidewire a 5 French Pinnacle sheath was inserted. Through this, and also over a 0.035 inch guidewire a 5 [place] JB 1 catheter was advanced to the aortic arch region and selectively positioned in the right common carotid artery, the right vertebral artery, the left vertebral artery and the left common carotid artery. FINDINGS: The left common carotid arteriogram demonstrates approximately 50% stenosis of the mid segment of the left common carotid artery. The left external carotid artery and its major branches are widely patent. The left internal carotid artery at the bulb demonstrates wide patency. More distally, there is mild decreased caliber of the left internal carotid artery in its entirety. The petrous, cavernous and supraclinoid segments demonstrate wide patency. The left  anterior cerebral artery demonstrates complete occlusion distal to the left A1 segment. This is associated with caliber irregularity in the A1 segment. The left middle cerebral artery M1 segment proximally demonstrates mild caliber irregularity. There is patency of the superior division of the left middle cerebral artery with delayed reconstitution of the distal inferior division of the left middle cerebral artery in the M2 M3 regions. Distal to this the M3 branches and the M4 branches demonstrated gradual ascent of contrast. The inferior division appears to be completely occluded associated with a stump. A diminutive anterior temporal branch is noted. A prominent anterior choroidal artery on the left is noted with delayed arterial blush noted in the region of the splenium of the corpus callosum. The whole head delayed arterial phase in the lateral projection demonstrates a prominent area of hypoperfusion involving the left parietal cortical and subcortical regions. The left vertebral artery origin is from the thyrocervical trunk. The vessel is seen to opacify to the cranial skull base. Wide patency is seen  of the left vertebrobasilar junction and the left posterior inferior cerebellar artery. The basilar artery, the posterior cerebral arteries, the superior cerebellar arteries and the anterior-inferior cerebellar arteries opacify into the capillary and venous phases. Prominent collaterals are seen in the P3 region of the left posterior cerebral artery retrogradely filling the posterior 1/3 of the left parietal cortical and subcortical areas. The delayed arterial phase then demonstrates retrograde opacification of the distal pericallosal artery in the splenium of the corpus callosum from the lateral thalamo perforator branches. Also demonstrated is retrograde opacification of the posterior half of the anterior temporal region from the posterior temporal branches of the P1 P2 region of the left posterior cerebral  artery. The right common carotid arteriogram demonstrates mild narrowing in the mid third of the right common carotid artery. The right external carotid artery and its major branches are widely patent. The right internal carotid artery at the bulb to the cranial skull base is seen to opacify unimpeded. The petrous segment is normal. There is mild narrowing of the caval cavernous segment of the right internal carotid artery. Distal to this the supraclinoid segment demonstrates mild decrease in caliber. The right middle cerebral artery demonstrates a 50% stenosis in the distal right M1 segment. The proximal superior and the inferior divisions demonstrate moderate stenosis. Again demonstrated is a prominent anterior choroidal artery on the right side. The delayed arterial phase demonstrates hypoperfusion of the parietal cortical and subcortical regions. The right vertebral artery origin is from the right thyrocervical trunk. Again this vessel is seen to opacify to the cranial skull base. Wide patency is seen of the right vertebrobasilar junction and the right posterior-inferior cerebellar artery. Multiple focal areas of caliber irregularity are seen involving the posterior-inferior cerebellar artery. The opacified portion of the basilar artery, the proximal posterior cerebral arteries, the superior cerebellar arteries and the anterior-inferior cerebellar arteries are seen to opacify into the capillary and venous phases. Extensive collaterals are again seen from the P3 regions of both posterior cerebral arteries contributing to the posterior parietal middle cerebral artery distributions bilaterally as described earlier. PROCEDURE: ENDOVASCULAR TREATMENT OF OCCLUDED DOMINANT INFERIOR DIVISION OF THE LEFT MIDDLE CEREBRAL ARTERY The diagnostic JB 1 catheter in the left common carotid artery was exchanged over a 0.035 inch 8 French 55 cm Brite tip neurovascular sheath using biplane roadmap technique and constant  fluoroscopic guidance. Good aspiration obtained from the hub of the 8 [place] Brite tip neurovascular sheath. A gentle contrast injection demonstrated no evidence of spasms, dissections or of intraluminal filling defects. This was then connected to continuous heparinized saline infusion. Over the [facility] exchange guidewire, an 85 cm 8 [place] of balloon FlowGate guide catheter which had been prepped with 50% contrast and 50% heparinized saline infusion was advanced and positioned just proximal to the left common carotid bifurcation. The guidewire was removed. Good aspiration was obtained at the hub of the [facility] guide catheter. Over a 0.035 inch Roadrunner guidewire, using biplane roadmap technique and constant fluoroscopic guidance, the FlowGate guide catheter was then advanced to the mid cervical left ICA. The guidewire was removed. Again good aspiration was obtained from the hub of the 8 [place] FlowGate guide catheter. A gentle control arteriogram performed centered extracranially and intracranially demonstrated no change in the intracranial circulation. Over a 0.014 inch Softip Synchro micro guidewire, a Trevo ProVue 021 microcatheter was advanced to the distal end of the of FlowGate guide catheter. With the micro guidewire leading with a J-tip configuration, the combination was navigated to the  supraclinoid left ICA. The micro guidewire was then advanced gently into the inferior division origin followed by the microcatheter. A gentle manipulation of the micro guidewire was then performed using a torque device to encourage advancement through the occluded left middle cerebral artery. The microcatheter was then brought into close proximity to the site of the occlusion. Further manipulation was performed with partial advancement of the micro guidewire through the occluded left middle cerebral artery inferior division. To avoid potential perforation of the more distal inferior division the micro guidewire was  retrieved. Good aspiration obtained from the hub of the microcatheter. Approximately 3.6 mg of super selective intracranial intra-arterial Integrilin was infused over a couple of minutes. Thereafter, further attempts were made to encourage advancement of a very soft microguidewire again with only partial success. 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 The procedure was then stopped. The final control arteriogram performed through the [facility] guide catheter in the left internal carotid artery continued to demonstrate no significant change in the intracranial circulation. No angiographic evidence of extravasation or mass-effect or midline shift was seen. A Dyna CT of the head performed revealed no evidence of hemorrhage or change in the ventricular size or mass effect or midline shift. The [facility] guide catheter and the neurovascular sheath were then exchanged over a J-tip 0.035 guidewire for an 8 [place] Pinnacle sheath. This was then removed with successful hemostasis being achieved by manual compression. At the end of the procedure, the patient's right groin appeared soft without evidence of bleeding or hematoma. Distal pulses continued to be palpable in the dorsalis pedis, and the posterior tibial regions unchanged from prior to the procedure. The patient's general anesthesia was then reversed. The patient was extubated without difficulty. Upon recovery the patient was noted to be able to raise his right arm, and also bend his right knee. He responded appropriately to simple commands. He was then transferred to the PACU and then to neuro ICU for further management. IMPRESSION: Status post attempted revascularization of occluded dominant inferior division of the right middle cerebral artery using 3.6 mg of super selective intracranial intra-arterial Integrilin and mechanical thrombolysis with a micro guidewire. PLAN: Further management as per stroke team. Electronically Signed   By: Julieanne Cotton M.D.   On: 07/06/2017 10:05   TTE pending - Left ventricle: Wall thickness was increased in a pattern of mild   LVH. Systolic function was mildly to moderately reduced. The   estimated ejection fraction was in the range of 40% to 45%.   Akinesis of the apicalanteroseptal, anterolateral, and apical   myocardium. Doppler parameters are consistent with abnormal left   ventricular relaxation (grade 1 diastolic dysfunction). - Aortic valve: There was mild regurgitation.   PHYSICAL EXAM  Temp:  [97.7 F (36.5 C)-98.2 F (36.8 C)] 97.8 F (36.6 C) (04/24 0835) Pulse Rate:  [38-110] 69 (04/24 0835) Resp:  [12-24] 19 (04/24 0900) BP: (77-145)/(55-94) 136/83 (04/24 0900) SpO2:  [95 %-100 %] 100 % (04/24 0900) Weight:  [193 lb 9 oz (87.8 kg)] 193 lb 9 oz (87.8 kg) (04/23 1138)  General - Well nourished, well developed, in no apparent distress.  Ophthalmologic - fundi not visualized due to noncooperation.  Cardiovascular - Regular rate and rhythm with no murmur.  Neuro -awake alert, global aphasia, able to say "yes" and "fine" only, able to follow commands on eye closing and opening, did not follow any other commands.  Left gaze preference, able to cross midline and attending to  right side, bilateral gaze incomplete.  Blinking to visual threat on the left, however inconsistently blinking to the right.  No significant right-sided neglect however.  PERRL, right facial droop.  Tongue midline.  Right UE flaccid on pain stimulation. RLE 2/5 withdrawal on pain summation. LUE and LLE purposeful movement and against gravity.  DTR 1+ and no Babinski. Sensation, coordination not cooperative and gait not tested.   ASSESSMENT/PLAN Mr. JEFFERIE HOLSTON is a 57 y.o. male with history of CAD/MI s/p stenting and CABG, cardiomyopathy with EF 35-40%, hypertension, hyperlipidemia, cognitive impairment, obesity, seizure, OSA, ESRD on hemodialysis admitted for AMS and right-sided weakness. No tPA given  due to outside window.    Stroke:  left MCA infarct, likely due to progressive large vessel atherosclerosis.  Chronic intracranial stenosis including bilateral MCAs since 2015.   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 Yes; Fluid consistency: Thin   aspirin 81 mg daily and clopidogrel 75 mg daily prior to admission, now on ASA 325mg  clopidogrel 75 mg daily. Continue ASA 325 and plavix for 3 months and then plavix alone given intracranial stenosis.   Patient counseled to be compliant with his antithrombotic medications  Ongoing aggressive stroke risk factor management  Therapy recommendations: CIR  Disposition: Pending  History of stroke  12/2013, admitted for hypotension after HD, MRI showed left caudate head acute infarct, chronic bilateral BG and pontine infarcts.  MRA severe ICAD involving bilateral MCAs.  Carotid Doppler negative.  LDL 89 and A1c 5.6.  Discharged with dual antiplatelet and Crestor 40  Follows with Dr. Terrace Arabia at [facility]  CHF with cardiomyopathy  EF 35 to 40% in 02/2017  TTE EF 40-45% this time  Continue outpt cardiology follow up  ESRD on HD  Nephrology on board  HD scheduled MWF PTA  Had HD yesterday  Cre 13.49->10.40 ->9.17  Hypertension / hypotension Stable  Off Cardene  Low BP on HD yesterday  Hold off home BP meds due to hypotension  Close monitoring  BP goal 120-150 given severe intracranial stenosis  Hyperlipidemia  Home meds: Lipitor 40  LDL 131, goal < 70  Now on Lipitor  80  Continue statin at discharge  Other Stroke Risk Factors  CAD/MI - s/p stenting and CABG  Obstructive sleep apnea, on CPAP at home  Other Active Problems  Cognitive impairment - following with Dr. Terrace Arabia at [facility]  ?? Seizure, not on meds  Hospital day # 3  This patient is critically ill due to left MCA stroke, ESRD on HD, hypotension, CHF, severe intracranial stenosis and at significant risk of neurological worsening, death form recurrent stroke, hemorrhagic conversion, seizure, renal failure, heart failure. This patient's care requires constant monitoring of vital signs, hemodynamics, respiratory and cardiac monitoring, review of multiple databases, neurological assessment, discussion with family, other specialists and medical decision making of high complexity. I spent 35 minutes of neurocritical care time in the care of this patient.  Marvel Plan, MD PhD Stroke Neurology 07/08/2017 9:58 AM    To contact Stroke Continuity  provider, please refer to [email or web address] After hours, contact General Neurology

## 2017-07-08 NOTE — Progress Notes (Signed)
  Speech Language Pathology Treatment: Dysphagia;Cognitive-Linquistic  Patient Details Name: [name] MRN: [id number] DOB: September 02, 1960 Today's Date: 07/08/2017 Time: 5621-3086 SLP Time Calculation (min) (ACUTE ONLY): 18 min  Assessment / Plan / Recommendation Clinical Impression  Pt tolerated purees and puddings, thin liquids without signs of aspriatino, but decreased autmaticity, awareness, right oral weakness and sensory deficit all lead to prolonged oral phase and ongoing risk of holding and pocketing. Utilized max verbal, visual and tactile cues to facilitate self feeding and automaticity. Pt does not sustain attention to task, needs constant cues to pick up spoon, dip in pudding, etc. Elicited minimal communicative response; eye contact sustained for several seconds, pt occasionally responds to a smile by smiling or given a thumbs up to questions or social interactions. Could not carry this over to follow thumbs up/down on command or to use and Y/N response with max to total assist. No articulation with singing counting, articulatory tactile cues. Will continue efforts.   HPI HPI: 57 yo code stroke from home via [facility] to [facility] ED with new R weakness, aphasia and neglect. MRI shows MRI shows cortically based restricted diffusion in the posterior left insula, superior temporal lobe, parietal lobe, and minimally involving the posterior left frontal lobe. Small remote infarcts in the left superior cerebellum, left paramedian pons, left basal ganglia/internal capsule, and right temporal parietal cortex. There is chronic blood products at the remote left basal ganglia infarct. No hemorrhage seen at the acute infarct.      SLP Plan  Continue with current plan of care       Recommendations  Diet recommendations: Dysphagia 1 (puree);Thin liquid Liquids provided via: Straw Medication Administration: Crushed with puree Supervision: Staff to assist with self feeding;Full supervision/cueing for  compensatory strategies Compensations: Slow rate;Small sips/bites;Lingual sweep for clearance of pocketing;Minimize environmental distractions;Follow solids with liquid                General recommendations: Rehab consult Oral Care Recommendations: Oral care BID Follow up Recommendations: Inpatient Rehab SLP Visit Diagnosis: Dysphagia, oral phase (R13.11);Aphasia (R47.01) Plan: Continue with current plan of care       GO               [facility], [place] CCC-SLP [phone]  Claudine Mouton 07/08/2017, 12:36 PM

## 2017-07-09 ENCOUNTER — Inpatient Hospital Stay (HOSPITAL_COMMUNITY): Payer: Medicare Other

## 2017-07-09 DIAGNOSIS — I2510 Atherosclerotic heart disease of native coronary artery without angina pectoris: Secondary | ICD-10-CM

## 2017-07-09 DIAGNOSIS — E7800 Pure hypercholesterolemia, unspecified: Secondary | ICD-10-CM

## 2017-07-09 DIAGNOSIS — I5042 Chronic combined systolic (congestive) and diastolic (congestive) heart failure: Secondary | ICD-10-CM

## 2017-07-09 DIAGNOSIS — R001 Bradycardia, unspecified: Secondary | ICD-10-CM

## 2017-07-09 DIAGNOSIS — I472 Ventricular tachycardia: Secondary | ICD-10-CM

## 2017-07-09 LAB — CBC
HEMATOCRIT: 39.6 % (ref 39.0–52.0)
HEMOGLOBIN: 13 g/dL (ref 13.0–17.0)
MCH: 30.7 pg (ref 26.0–34.0)
MCHC: 32.8 g/dL (ref 30.0–36.0)
MCV: 93.6 fL (ref 78.0–100.0)
Platelets: 169 10*3/uL (ref 150–400)
RBC: 4.23 MIL/uL (ref 4.22–5.81)
RDW: 14.8 % (ref 11.5–15.5)
WBC: 9.3 10*3/uL (ref 4.0–10.5)

## 2017-07-09 LAB — GLUCOSE, CAPILLARY
GLUCOSE-CAPILLARY: 95 mg/dL (ref 65–99)
Glucose-Capillary: 92 mg/dL (ref 65–99)
Glucose-Capillary: 94 mg/dL (ref 65–99)
Glucose-Capillary: 78 mg/dL (ref 65–99)

## 2017-07-09 LAB — BASIC METABOLIC PANEL
ANION GAP: 18 — AB (ref 5–15)
BUN: 27 mg/dL — ABNORMAL HIGH (ref 6–20)
CHLORIDE: 98 mmol/L — AB (ref 101–111)
CO2: 21 mmol/L — AB (ref 22–32)
Calcium: 10.5 mg/dL — ABNORMAL HIGH (ref 8.9–10.3)
Creatinine, Ser: 12.24 mg/dL — ABNORMAL HIGH (ref 0.61–1.24)
GFR calc Af Amer: 5 mL/min — ABNORMAL LOW (ref >60)
GFR calc non Af Amer: 4 mL/min — ABNORMAL LOW (ref >60)
Glucose, Bld: 95 mg/dL (ref 65–99)
POTASSIUM: 4 mmol/L (ref 3.5–5.1)
Sodium: 137 mmol/L (ref 135–145)

## 2017-07-09 LAB — TROPONIN I: Troponin I: 11.47 ng/mL (ref <0.03)

## 2017-07-09 LAB — HEPARIN LEVEL (UNFRACTIONATED): Heparin Unfractionated: 0.51 IU/mL (ref 0.30–0.70)

## 2017-07-09 MED ORDER — PERFLUTREN LIPID MICROSPHERE
1.0000 mL | INTRAVENOUS | Status: AC | PRN
  Administered 2017-07-09: 4 mL via INTRAVENOUS
  Filled 2017-07-09 (×2): qty 10

## 2017-07-09 MED ORDER — METOPROLOL TARTRATE 12.5 MG HALF TABLET
12.5000 mg | ORAL_TABLET | Freq: Two times a day (BID) | ORAL | Status: DC
  Administered 2017-07-09 – 2017-07-13 (×9): 12.5 mg via ORAL
  Filled 2017-07-09 (×11): qty 1

## 2017-07-09 MED ORDER — HYDRALAZINE HCL 20 MG/ML IJ SOLN
5.0000 mg | INTRAMUSCULAR | Status: DC | PRN
  Administered 2017-07-10 – 2017-07-17 (×2): 10 mg via INTRAVENOUS
  Filled 2017-07-09 (×2): qty 1

## 2017-07-09 MED ORDER — LABETALOL HCL 5 MG/ML IV SOLN: 10.0000 mg | INTRAVENOUS | Status: DC | PRN

## 2017-07-09 MED ORDER — NITROGLYCERIN 2 % TD OINT
1.0000 [in_us] | TOPICAL_OINTMENT | Freq: Four times a day (QID) | TRANSDERMAL | Status: DC
  Administered 2017-07-09 – 2017-07-14 (×19): 1 [in_us] via TOPICAL
  Filled 2017-07-09: qty 30

## 2017-07-09 MED ORDER — HEPARIN (PORCINE) IN NACL 100-0.45 UNIT/ML-% IJ SOLN
1350.0000 [IU]/h | INTRAMUSCULAR | Status: DC
  Administered 2017-07-09: 1150 [IU]/h via INTRAVENOUS
  Administered 2017-07-10: 1100 [IU]/h via INTRAVENOUS
  Administered 2017-07-11 – 2017-07-12 (×2): 1000 [IU]/h via INTRAVENOUS
  Administered 2017-07-15 – 2017-07-16 (×3): 1350 [IU]/h via INTRAVENOUS
  Filled 2017-07-09 (×9): qty 250

## 2017-07-09 NOTE — Progress Notes (Signed)
I contacted pt's sister by phone, Jessie Foot. She states pt lives alone now that his Mom is receiving SNF care at [facility]. Pt does not have any caregiver support at home and sister prefers pt to go to SNF at [facility] at discharge. I have notified SW, Verdon Cummins, and we will sign off at this time. [phone]

## 2017-07-09 NOTE — Progress Notes (Addendum)
ANTICOAGULATION CONSULT NOTE  Pharmacy Consult for heparin Indication: chest pain/ACS  Heparin Dosing Weight: 86.8 kg  Labs: Recent Labs    07/08/17 0406 07/08/17 2100 07/09/17 0447 07/09/17 0750  HGB 13.2 13.1 13.0  --   HCT 39.7 38.9* 39.6  --   PLT 220 229 169  --   CREATININE 9.17* 11.20*  --  12.24*  TROPONINI  --  0.62*  --  11.47*    Assessment: 5 yom presenting with CVA (did not receive TPA, s/p mechanical thrombectomy) with subsequent tachyarrhythmias and elevation in troponin. Pharmacy consulted to dose heparin for ACS. CCM spoke with Neuro and no contraindication to anticoagulation. On asa/plavix PTA but not on anticoagulation. CBC wnl. No bleed documented. Previously on Lovenox ppx dosing - last dose given last night.  Goal of Therapy:  Heparin level 0.3-0.5 units/ml, no boluses Monitor platelets by anticoagulation protocol: Yes   Plan:  No boluses with acute CVA Start heparin at 1150 units/h 8h heparin level Daily heparin level/CBC Monitor s/sx bleeding  Babs Bertin, PharmD, BCPS Clinical Pharmacist 07/09/2017 10:46 AM

## 2017-07-09 NOTE — Consult Note (Addendum)
Cardiology Consult    Patient ID: [name] MRN: [id number], DOB/AGE: October 30, 1960   Admit date: 07/05/2017 Date of Consult: 07/09/2017  Primary Physician: Corwin Levins, MD Primary Cardiologist: Dr. Allyson Sabal Requesting Provider: Dr. Roda Shutters Reason for Consultation: + trop, and NSVT  [name] is a 57 y.o. male who is being seen today for the evaluation of + trop/NSVT at the request of Dr. Roda Shutters.   Patient Profile    57 yo male with PMH of CAD s/p CABG ('13), ESRD on HD, HTN, Stroke, HLD, GERD and anemia who presented as Code Stroke. Underwent IA intergrilin and mechanical thrombolysis. Developed elevated HR, hypertension and now with rising Trop.   Past Medical History   Past Medical History:  Diagnosis Date  . ACHALASIA   . ANEMIA-NOS   . CAD (coronary artery disease), 3 vessel significant disease.    . Cardiomyopathy (HCC) 05/12/2017  . CEREBROVASCULAR ACCIDENT, HX OF   . CHF (congestive heart failure) (HCC)   . CKD (chronic kidney disease) stage 4, GFR 15-29 ml/min (HCC)   . Constipation   . DEGENERATIVE JOINT DISEASE, SHOULDER    knee  . DEPRESSION    2006- not depressed any longer  . Family history of adverse reaction to anesthesia    Mother, hard to awaken  . GERD    no meds required  . Gout    takes Uloric daily  . H/O hiatal hernia   . Headache(784.0)   . Heart attack (HCC)   . History of colon polyps   . History of diastolic dysfunction, grade 2 by echo   . HYPERLIPIDEMIA    takes crestor daily  . Hyperlipidemia   . HYPERTENSION    takes Norvasc,Catapress,Hydralazine,Imdur,and Metoprolol daily  . Impaired glucose tolerance   . Insomnia    takes Ambien nightly  . NSTEMI (non-ST elevated myocardial infarction), 06/30/11 07/01/2011  . RENAL CALCULUS, HX OF   . RENAL INSUFFICIENCY    12/2013- TThSat  . S/P coronary artery stent placement, PTCA/DES Resolute to mid-LAD via the LIMA graft, and PTCA of apical 95% stenosis 07/05/11 07/04/2011  . S/P PTCA  (percutaneous transluminal coronary angioplasty), 06/30/11 07/01/2011  . Seizures (HCC)    INSULIN INDUCED  . Sleep apnea    SLEEP STUDY IN [place] , NO MACHINE YET  1 YEAR  . Stroke (HCC)    93/2005/2006/2007;left sided weakness.  1993 12/2013  . Thyroid disease     Past Surgical History:  Procedure Laterality Date  . ARCH AORTOGRAM N/A 02/19/2012   Procedure: ARCH AORTOGRAM;  Surgeon: Fransisco Hertz, MD;  Location: [facility] CATH LAB;  Service: Cardiovascular;  Laterality: N/A;  . AV FISTULA PLACEMENT  12/02/2011   Procedure: ARTERIOVENOUS (AV) FISTULA CREATION;  Surgeon: Fransisco Hertz, MD;  Location: [facility] OR;  Service: Vascular;  Laterality: Left;  Ultrasound Guided  . AV FISTULA PLACEMENT Left 06/08/2012   Procedure: ARTERIOVENOUS (AV) FISTULA CREATION;  Surgeon: Fransisco Hertz, MD;  Location: [facility] OR;  Service: Vascular;  Laterality: Left;  . BASCILIC VEIN TRANSPOSITION Left 09/06/2014   Procedure: FIRST STAGE BASILIC VEIN TRANSPOSITION ;  Surgeon: Fransisco Hertz, MD;  Location: [facility] OR;  Service: Vascular;  Laterality: Left;  . BASCILIC VEIN TRANSPOSITION Left 12/06/2014   Procedure: SECOND STAGE BRACHIAL VEIN TRANSPOSITION ;  Surgeon: Fransisco Hertz, MD;  Location: [facility] OR;  Service: Vascular;  Laterality: Left;  . CARDIAC CATHETERIZATION    . COLONOSCOPY    . CORONARY ANGIOPLASTY  06/30/11   AT [facility]  . CORONARY ARTERY BYPASS GRAFT  04/01/2011   Procedure: CORONARY ARTERY BYPASS GRAFTING (CABG);  Surgeon: Kathlee Nations Suann Larry, MD;  Location: [facility] OR;  Service: Open Heart Surgery;  Laterality: N/A;  . HERNIA REPAIR  2013  . INSERTION OF DIALYSIS CATHETER  04/01/2011   Procedure: INSERTION OF DIALYSIS CATHETER;  Surgeon: Nilda Simmer, MD;  Location: [facility] OR;  Service: Vascular;  Laterality: N/A;  . IR ANGIO INTRA EXTRACRAN SEL COM CAROTID INNOMINATE UNI R MOD SED  07/06/2017  . IR ANGIO VERTEBRAL SEL VERTEBRAL BILAT MOD SED  07/06/2017  . IR CT HEAD LTD  07/06/2017  . IR PERCUTANEOUS ART THROMBECTOMY/INFUSION  INTRACRANIAL INC DIAG ANGIO  07/06/2017  . KNEE SURGERY Right 01/07/2011   arthroscopy  . LEFT HEART CATH AND CORS/GRAFTS ANGIOGRAPHY N/A 03/05/2017   Procedure: LEFT HEART CATH AND CORS/GRAFTS ANGIOGRAPHY;  Surgeon: Runell Gess, MD;  Location: [facility] INVASIVE CV LAB;  Service: Cardiovascular;  Laterality: N/A;  . LEFT HEART CATHETERIZATION WITH CORONARY ANGIOGRAM N/A 03/28/2011   Procedure: LEFT HEART CATHETERIZATION WITH CORONARY ANGIOGRAM;  Surgeon: Lennette Bihari, MD;  Location: [facility] CATH LAB;  Service: Cardiovascular;  Laterality: N/A;  pending creatnine  . LEFT HEART CATHETERIZATION WITH CORONARY/GRAFT ANGIOGRAM N/A 06/30/2011   Procedure: LEFT HEART CATHETERIZATION WITH Isabel Caprice;  Surgeon: Thurmon Fair, MD;  Location: [facility] CATH LAB;  Service: Cardiovascular;  Laterality: N/A;  . LIGATION OF ARTERIOVENOUS  FISTULA Left 06/08/2012   Procedure: LIGATION OF ARTERIOVENOUS  FISTULA;  Surgeon: Fransisco Hertz, MD;  Location: [facility] OR;  Service: Vascular;  Laterality: Left;  . NM MYOCAR PERF EJECTION FRACTION  06/25/2011   There is evidence of mild ischemia in the basal inferolateral and mid inferolateral regions. (Extent 7 %) The post-stress ejection fraction is 44%. There is mild hypocontractility in the distal inferoapical segment. baseline T wave inversion is noted in leads I, avL, II, V2. This is a low risk scan. The present perfusion study suggests significant myocardial salvage with mild residual ischemia .  Marland Kitchen PERCUTANEOUS CORONARY INTERVENTION-BALLOON ONLY  06/30/2011   Procedure: PERCUTANEOUS CORONARY INTERVENTION-BALLOON ONLY;  Surgeon: Thurmon Fair, MD;  Location: [facility] CATH LAB;  Service: Cardiovascular;;  . PERCUTANEOUS CORONARY STENT INTERVENTION (PCI-S) N/A 07/04/2011   Procedure: PERCUTANEOUS CORONARY STENT INTERVENTION (PCI-S);  Surgeon: Lennette Bihari, MD;  Location: [facility] CATH LAB;  Service: Cardiovascular;  Laterality: N/A;  . RADIOLOGY WITH ANESTHESIA N/A 07/05/2017   Procedure:  RADIOLOGY WITH ANESTHESIA;  Surgeon: Julieanne Cotton, MD;  Location: [facility] OR;  Service: Radiology;  Laterality: N/A;  . REVISION OF ARTERIOVENOUS GORETEX GRAFT Left 12/06/2014   Procedure: REVISION OF ARTERIOVENOUS GORETEX GRAFT USING 18mmx10cm;  Surgeon: Fransisco Hertz, MD;  Location: [facility] OR;  Service: Vascular;  Laterality: Left;  . STOMACH SURGERY  2009   achalasia  . SUBCLAVIAN STENT PLACEMENT  12/20/2011  . TONSILLECTOMY    . TONSILLECTOMY       Allergies  Allergies  Allergen Reactions  . Shrimp [Shellfish Allergy] Shortness Of Breath  . Atorvastatin Other (See Comments)    weakness  . Insulins Other (See Comments)    Patient unsure as to the kind of insulin but states that it has previously caused seizures.  Most recent hospitalization (Jan 2013) received insulin but did not have reaction. LIKELY DUE TO SIGNIFICANT HYPOGLYCEMIA  . Simvastatin Other (See Comments)     abnormal liver tests  . Ultram [Tramadol] Other (See Comments)    Liver  enzyme     History of Present Illness    [name] is a 57 yo male with PMH of CAD s/p CABG ('13), ESRD on HD, HTN, Stroke, HLD, GERD and anemia. He presented back in 12/18 with reports of chest pain. Noted to have an elevated trop of 0.41 and underwent cath noting patent 3/3 grafts with several areas of possible ischemia but no change from cath back in 2013. It was felt his culprit for ischemia was likely related to missing HD. He was restarted on plavix this admission. Follow up in the office on 03/19/17 with Joni Reining and reported overall improvement. Stated he has been compliant with his home medications.   He presented on 07/05/17 with sign of LMCA stroke. Was last seen normal on Friday. He was not given TPA as he was out of the window. Taken to IR with Dr. Corliss Skains with 4 vessel cerebral arteriogram followed by IA infusion of 3.6mg  of superselective integrelin of the L MCA with failed mechanical thrombolysis. He was extubated the following  day. 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 He was cleared by speech for a diet. Underwent HD on 4/23 with drop in BP. Decreased the amount of fluid removed from HD and blood pressures stabilized. Echo on 4/22 showed EF of 40-45% with akinesis of the apicalanteroseptal, anterolateral and apical myocardium, with G1DD. This is a mild improvement from echo back in 12/18 but with new WMA in the anteroseptal wall. CIR was consulted for admission once patient ready for discharge.   Last evening around 7pm had a sudden onset of severe hypertension and tachycardia. Patient was reported clutching his head. Stat CT head showed no acute changes. Trop cycled 0.62>>11.47. In review of telemetry he had episodes of bradycardia with long runs of VT. Unfortunately patient remains global aphasic and when asked questions gives a thumbs up for everything. Repeat echo has been ordered by primary.   Inpatient Medications    .  stroke: mapping our early stages of recovery book   Does not apply Once  . aspirin EC  325 mg Oral Daily  . atorvastatin  80 mg Oral Daily  . Chlorhexidine Gluconate Cloth  6 each Topical Q0600  . clopidogrel  75 mg Oral Daily  . doxercalciferol  2 mcg Intravenous Q M,W,F-HD  . enoxaparin (LOVENOX) injection  30 mg Subcutaneous Q24H  . febuxostat  40 mg Oral Daily  . feeding supplement (NEPRO CARB STEADY)  237 mL Oral BID BM  . mouth rinse  15 mL Mouth Rinse BID  . memantine  10 mg Oral BID  . metoprolol tartrate  12.5 mg Oral BID  . mupirocin ointment  1 application Nasal BID    Family History    Family History  Problem Relation Age of Onset  . Cancer Mother   . Heart disease Mother   . Hyperlipidemia Mother   . Hypertension Mother   . Alzheimer's disease Mother   . Cancer Father   . Heart disease Father   . Hypertension Other   . Stroke Other   . Hyperlipidemia Other     Social History    Social History   Socioeconomic History  . Marital status: Divorced     Spouse name: Not on file  . Number of children: 1  . Years of education: Bachelors  . Highest education level: Not on file  Occupational History  . Occupation: Disabled  Social Needs  . Financial resource strain: Not on file  .  Food insecurity:    Worry: Not on file    Inability: Not on file  . Transportation needs:    Medical: Not on file    Non-medical: Not on file  Tobacco Use  . Smoking status: Never Smoker  . Smokeless tobacco: Former Neurosurgeon    Types: Chew  . Tobacco comment: chewed tobacco   Substance and Sexual Activity  . Alcohol use: Yes    Alcohol/week: 1.2 oz    Types: 2 Shots of liquor per week    Comment: rarely; beer  . Drug use: No  . Sexual activity: Not Currently    Partners: Male  Lifestyle  . Physical activity:    Days per week: Not on file    Minutes per session: Not on file  . Stress: Not on file  Relationships  . Social connections:    Talks on phone: Not on file    Gets together: Not on file    Attends religious service: Not on file    Active member of club or organization: Not on file    Attends meetings of clubs or organizations: Not on file    Relationship status: Not on file  . Intimate partner violence:    Fear of current or ex partner: Not on file    Emotionally abused: Not on file    Physically abused: Not on file    Forced sexual activity: Not on file  Other Topics Concern  . Not on file  Social History Narrative   Lives at home alone.   Right-handed.   Occasional caffeine use.     Review of Systems    General:  No chills, fever, night sweats or weight changes.  Cardiovascular:  No chest pain, dyspnea on exertion, edema, orthopnea, palpitations, paroxysmal nocturnal dyspnea. Dermatological: No rash, lesions/masses Respiratory: No cough, dyspnea Urologic: No hematuria, dysuria Abdominal:   No nausea, vomiting, diarrhea, bright red blood per rectum, melena, or hematemesis Neurologic:  No visual changes, wkns, changes in mental  status. All other systems reviewed and are otherwise negative except as noted above.  Physical Exam    Blood pressure (!) 165/85, pulse (!) 31, temperature 98 F (36.7 C), temperature source Oral, resp. rate 19, height 5\' 11"  (1.803 m), weight 193 lb 9 oz (87.8 kg), SpO2 100 %.  General: AAM, aphasic, NAD Psych: Normal affect. Neuro: Alert. Right sided hemiparesis HEENT: Normal  Neck: Supple without bruits or JVD. Lungs:  Resp regular and unlabored, CTA. Heart: RRR no s3, s4, or murmurs. Abdomen: Soft, non-tender, non-distended, BS + x 4.  Extremities: No clubbing, cyanosis or edema. DP/PT/Radials 2+ and equal bilaterally.  Labs    Troponin (Point of Care Test) No results for input(s): TROPIPOC in the last 72 hours. Recent Labs    07/08/17 2100 07/09/17 0750  TROPONINI 0.62* 11.47*   Lab Results  Component Value Date   WBC 9.3 07/09/2017   HGB 13.0 07/09/2017   HCT 39.6 07/09/2017   MCV 93.6 07/09/2017   PLT 169 07/09/2017    Recent Labs  Lab 07/08/17 2100 07/09/17 0750  NA 136 137  K 3.4* 4.0  CL 95* 98*  CO2 25 21*  BUN 23* 27*  CREATININE 11.20* 12.24*  CALCIUM 10.4* 10.5*  PROT 7.0  --   BILITOT 0.9  --   ALKPHOS 49  --   ALT <5*  --   AST 21  --   GLUCOSE 147* 95   Lab Results  Component Value Date  CHOL 180 07/06/2017   HDL 29 (L) 07/06/2017   LDLCALC 131 (H) 07/06/2017   TRIG 98 07/06/2017   No results found for: [facility]   Radiology Studies    Ct Angio Head W Or Wo Contrast  Result Date: 07/05/2017 CLINICAL DATA:  Right hemiparesis and difficulty speaking. EXAM: CT ANGIOGRAPHY HEAD AND NECK CT PERFUSION BRAIN TECHNIQUE: Multidetector CT imaging of the head and neck was performed using the standard protocol during bolus administration of intravenous contrast. Multiplanar CT image reconstructions and MIPs were obtained to evaluate the vascular anatomy. Carotid stenosis measurements (when applicable) are obtained utilizing NASCET criteria, using  the distal internal carotid diameter as the denominator. Multiphase CT imaging of the brain was performed following IV bolus contrast injection. Subsequent parametric perfusion maps were calculated using RAPID software. CONTRAST:  ISOVUE-370 IOPAMIDOL (ISOVUE-370) INJECTION 76% COMPARISON:  Head MRA 12/29/2013.  Neck MRA 06/30/2005 FINDINGS: CTA NECK FINDINGS Aortic arch: Standard 3 vessel aortic arch. Widely patent arch vessel origins. Right carotid system: Patent with predominantly soft plaque in the common carotid artery resulting in less than 50% narrowing. Minimal calcified plaque at the carotid bifurcation. No ICA stenosis. Left carotid system: Patent with predominantly soft plaque in the distal common carotid artery resulting in 50% stenosis. Moderate calcified plaque about the carotid bifurcation. No ICA stenosis. Vertebral arteries: Patent with the left being slightly dominant. Mild proximal right V2 stenosis due to calcified plaque. Calcified and soft plaque in the left V1 segment with mild multifocal stenoses. Skeleton: Mild cervical disc degeneration. Other neck: Multiple thyroid nodules including a 2.9 cm exophytic. Nodule extending inferiorly from the isthmus. Upper chest: Clear lung apices. Review of the MIP images confirms the above findings CTA HEAD FINDINGS Anterior circulation: The internal carotid arteries are patent from skull base to carotid termini with calcified plaque resulting in mild cavernous and supraclinoid stenosis on the right. The M1 segments are patent with mild stenosis on the right. The left M2 inferior division is occluded at its origin with some reconstitution of more distal M2 and M3 branches, however there are numerous missing distal branches in the left parietal region corresponding to the acute infarct. The prior MRA also suggested a severe stenosis or possibly short segment occlusion of the left M2 inferior division, however motion artifact on that study makes it  difficult to make a detailed assessment of interval changes. There are also likely some missing distal left MCA superior division branch vessels. There also severe proximal right M2 stenoses which are at least partially chronic. The right A1 segment is occluded. The left A1 segment is patent but demonstrates a severe proximal stenosis. Both A2 segments are heavily diseased with multifocal severe stenoses and or short segment occlusions. The appearance of the ACAs is grossly similar to the prior motion degraded MRA. No aneurysm. Posterior circulation: The intracranial vertebral arteries are patent to the basilar. There is right greater than left V4 segment calcified plaque without significant stenosis. Patent SCA origins are identified bilaterally. PICA and AICA origins are not well evaluated. The basilar artery is widely patent. Posterior communicating arteries are diminutive or absent. PCAs are patent with mild left P2 stenosis. No aneurysm. Venous sinuses: Inadequately assessed due to arterial phase contrast timing. Anatomic variants: None. Delayed phase: Not performed. Review of the MIP images confirms the above findings CT Brain Perfusion Findings: CBF (<30%) Volume: 28mL Perfusion (Tmax>6.0s) volume: , however a substantial amount of this is located in the right cerebral hemisphere Mismatch Volume: though  with the caveat that this is artifactually inflated due to (likely chronic) prolonged perfusion in the right cerebral hemisphere. There is still however an apparent sizable penumbra in the posterior left cerebral hemisphere. Infarction Location:Predominantly left parietal lobe with a small amount of posterior left frontal lobe, posterior left temporal lobe, and posterior insula involvement. IMPRESSION: 1. 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 2. Left M2 inferior division occlusion at its origin with some distal reconstitution. Severe stenosis (or possibly short segment  occlusion) in this location on a prior MRA. Numerous missing distal branch vessels in the left parietal region corresponding to the acute infarct. 3. Advanced intracranial atherosclerosis with numerous severe stenoses as above. 4. 50% left common carotid artery stenosis. 5.  Aortic Atherosclerosis (ICD10-I70.0). 6. Thyroid nodules measuring up to 2.9 cm. Nonemergent thyroid ultrasound recommended for further evaluation. These results were communicated to Dr. Amada Jupiter at 10:18 pm on 07/05/2017 by text page via the [facility] messaging system. Electronically Signed   By: Sebastian Ache M.D.   On: 07/05/2017 22:47   Ct Head Wo Contrast  Result Date: 07/08/2017 CLINICAL DATA:  57 y/o M; stroke with sudden increase in blood pressure. EXAM: CT HEAD WITHOUT CONTRAST TECHNIQUE: Contiguous axial images were obtained from the base of the skull through the vertex without intravenous contrast. COMPARISON:  07/06/2017 CT head. FINDINGS: Brain: Small area of loss of gray-white differentiation within the left parietal lobe is similar in distribution comparison with area of infarction on MRI and is compatible with evolving late acute to early subacute infarction. No new area of acute infarction, hemorrhage, or focal mass effect identified. Stable small chronic infarction in the right posterior temporal lobe, left hemi pons, left inferior cerebellum, and lacunar infarctions within bilateral anterior basal ganglia. Stable background of chronic microvascular ischemic changes and parenchymal volume loss of the brain. Vascular: Persistent contrast within the venous and arterial systems. Skull: Normal. Negative for fracture or focal lesion. Sinuses/Orbits: No acute finding. Other: None. IMPRESSION: 1. Left parietal region of acute infarction is stable in comparison with prior MRI given differences in technique. No new acute intracranial abnormality identified. 2. Stable background of chronic infarctions, microvascular ischemic changes,  parenchymal volume loss of the brain. Electronically Signed   By: Mitzi Hansen M.D.   On: 07/08/2017 20:03   Ct Head Wo Contrast  Result Date: 07/06/2017 CLINICAL DATA:  Stroke follow-up EXAM: CT HEAD WITHOUT CONTRAST TECHNIQUE: Contiguous axial images were obtained from the base of the skull through the vertex without intravenous contrast. COMPARISON:  Brain MRI 07/06/2017 Head CT 07/06/2017 at 5:35 a.m. FINDINGS: Brain: Decreased density of material of the left basal ganglia, consistent with clearing of contrast staining. Right temporal encephalomalacia is unchanged. No acute hemorrhage or mass effect. No hydrocephalus. Vascular: Small amount of residual contrast material within the vessels. Skull: Normal visualized skull base, calvarium and extracranial soft tissues. Sinuses/Orbits: No sinus fluid levels or advanced mucosal thickening. No mastoid effusion. Normal orbits. IMPRESSION: Decreased density of hyperdense material at the left basal ganglia, consistent with clearing of contrast staining. No new abnormality. Electronically Signed   By: Deatra Robinson M.D.   On: 07/06/2017 23:24   Ct Head Wo Contrast  Result Date: 07/06/2017 CLINICAL DATA:  Left MCA infarct status post intervention EXAM: CT HEAD WITHOUT CONTRAST TECHNIQUE: Contiguous axial images were obtained from the base of the skull through the vertex without intravenous contrast. COMPARISON:  Cerebral angiogram 07/05/2017 CTA head neck 07/05/2017 FINDINGS: Brain: There is hyperdensity within the left  basal ganglia, likely indicating contrast staining. No midline shift or mass effect. No hydrocephalus. Unchanged right temporal lobe encephalomalacia. Vascular: There is intravascular contrast material related to prior an angiographic study. Skull: Normal visualized skull base, calvarium and extracranial soft tissues. Sinuses/Orbits: No sinus fluid levels or advanced mucosal thickening. No mastoid effusion. Normal orbits. IMPRESSION:  Hyperdensity of the left basal ganglia is favored to represent contrast staining over petechial hemorrhage. Electronically Signed   By: Deatra Robinson M.D.   On: 07/06/2017 05:56   Ct Angio Neck W Or Wo Contrast  Result Date: 07/05/2017 CLINICAL DATA:  Right hemiparesis and difficulty speaking. EXAM: CT ANGIOGRAPHY HEAD AND NECK CT PERFUSION BRAIN TECHNIQUE: Multidetector CT imaging of the head and neck was performed using the standard protocol during bolus administration of intravenous contrast. Multiplanar CT image reconstructions and MIPs were obtained to evaluate the vascular anatomy. Carotid stenosis measurements (when applicable) are obtained utilizing NASCET criteria, using the distal internal carotid diameter as the denominator. Multiphase CT imaging of the brain was performed following IV bolus contrast injection. Subsequent parametric perfusion maps were calculated using RAPID software. CONTRAST:  ISOVUE-370 IOPAMIDOL (ISOVUE-370) INJECTION 76% COMPARISON:  Head MRA 12/29/2013.  Neck MRA 06/30/2005 FINDINGS: CTA NECK FINDINGS Aortic arch: Standard 3 vessel aortic arch. Widely patent arch vessel origins. Right carotid system: Patent with predominantly soft plaque in the common carotid artery resulting in less than 50% narrowing. Minimal calcified plaque at the carotid bifurcation. No ICA stenosis. Left carotid system: Patent with predominantly soft plaque in the distal common carotid artery resulting in 50% stenosis. Moderate calcified plaque about the carotid bifurcation. No ICA stenosis. Vertebral arteries: Patent with the left being slightly dominant. Mild proximal right V2 stenosis due to calcified plaque. Calcified and soft plaque in the left V1 segment with mild multifocal stenoses. Skeleton: Mild cervical disc degeneration. Other neck: Multiple thyroid nodules including a 2.9 cm exophytic. Nodule extending inferiorly from the isthmus. Upper chest: Clear lung apices. Review of the MIP images  confirms the above findings CTA HEAD FINDINGS Anterior circulation: The internal carotid arteries are patent from skull base to carotid termini with calcified plaque resulting in mild cavernous and supraclinoid stenosis on the right. The M1 segments are patent with mild stenosis on the right. The left M2 inferior division is occluded at its origin with some reconstitution of more distal M2 and M3 branches, however there are numerous missing distal branches in the left parietal region corresponding to the acute infarct. The prior MRA also suggested a severe stenosis or possibly short segment occlusion of the left M2 inferior division, however motion artifact on that study makes it difficult to make a detailed assessment of interval changes. There are also likely some missing distal left MCA superior division branch vessels. There also severe proximal right M2 stenoses which are at least partially chronic. The right A1 segment is occluded. The left A1 segment is patent but demonstrates a severe proximal stenosis. Both A2 segments are heavily diseased with multifocal severe stenoses and or short segment occlusions. The appearance of the ACAs is grossly similar to the prior motion degraded MRA. No aneurysm. Posterior circulation: The intracranial vertebral arteries are patent to the basilar. There is right greater than left V4 segment calcified plaque without significant stenosis. Patent SCA origins are identified bilaterally. PICA and AICA origins are not well evaluated. The basilar artery is widely patent. Posterior communicating arteries are diminutive or absent. PCAs are patent with mild left P2 stenosis. No aneurysm. Venous sinuses:  Inadequately assessed due to arterial phase contrast timing. Anatomic variants: None. Delayed phase: Not performed. Review of the MIP images confirms the above findings CT Brain Perfusion Findings: CBF (<30%) Volume: 28mL Perfusion (Tmax>6.0s) volume: , however a substantial amount  of this is located in the right cerebral hemisphere Mismatch Volume: though with the caveat that this is artifactually inflated due to (likely chronic) prolonged perfusion in the right cerebral hemisphere. There is still however an apparent sizable penumbra in the posterior left cerebral hemisphere. Infarction Location:Predominantly left parietal lobe with a small amount of posterior left frontal lobe, posterior left temporal lobe, and posterior insula involvement. IMPRESSION: 1. 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 2. Left M2 inferior division occlusion at its origin with some distal reconstitution. Severe stenosis (or possibly short segment occlusion) in this location on a prior MRA. Numerous missing distal branch vessels in the left parietal region corresponding to the acute infarct. 3. Advanced intracranial atherosclerosis with numerous severe stenoses as above. 4. 50% left common carotid artery stenosis. 5.  Aortic Atherosclerosis (ICD10-I70.0). 6. Thyroid nodules measuring up to 2.9 cm. Nonemergent thyroid ultrasound recommended for further evaluation. These results were communicated to Dr. Amada Jupiter at 10:18 pm on 07/05/2017 by text page via the [facility] messaging system. Electronically Signed   By: Sebastian Ache M.D.   On: 07/05/2017 22:47   Mr Maxine Glenn Head Wo Contrast  Result Date: 07/06/2017 CLINICAL DATA:  Stroke follow-up EXAM: MRI HEAD WITHOUT CONTRAST MRA HEAD WITHOUT CONTRAST TECHNIQUE: Multiplanar, multiecho pulse sequences of the brain and surrounding structures were obtained without intravenous contrast. Angiographic images of the head were obtained using MRA technique without contrast. COMPARISON:  CT, CTA, and CTP 07/05/2017.  Brain MRI 04/27/2016 FINDINGS: MRI HEAD FINDINGS Brain: Cortically based restricted diffusion in the posterior left insula, superior temporal lobe, parietal lobe, and minimally involving the posterior left frontal lobe. There is good correlation  with infarct by CT perfusion. Small remote infarcts in the left superior cerebellum, left paramedian pons, left basal ganglia/internal capsule, and right temporal parietal cortex. There is chronic blood products at the remote left basal ganglia infarct. No hemorrhage seen at the acute infarct. No hydrocephalus or masslike finding. Vascular: Arterial findings below. Normal dural venous sinus flow voids. Skull and upper cervical spine: Negative Sinuses/Orbits: Negative MRA HEAD FINDINGS Symmetric carotid and vertebral arteries. The vertebral and basilar arteries are smooth and diffusely patent. Atheromatous irregularity of bilateral posterior cerebral arteries with high-grade left P2 segment narrowing. Essentially nonvisualized bilateral ACA attributed to advanced atheromatous narrowing based on prior CTA. Less robust flow signal seen in the left MCA with continued nonvisualization of the inferior division. The left superior division and right MCA vasculature show diffuse atheromatous irregularity. IMPRESSION: 1. Acute left MCA territory cortical infarct without detected progression when compared to CTP from yesterday. No hemorrhagic conversion. 2. Continued non visualization of the left MCA inferior division. 3. There is advanced intracranial atherosclerosis with nonvisualized ACAs and severe left P2 segment stenosis. 4. Remote small vessel infarcts as described. Electronically Signed   By: Marnee Spring M.D.   On: 07/06/2017 12:59   Mr Brain Wo Contrast  Result Date: 07/06/2017 CLINICAL DATA:  Stroke follow-up EXAM: MRI HEAD WITHOUT CONTRAST MRA HEAD WITHOUT CONTRAST TECHNIQUE: Multiplanar, multiecho pulse sequences of the brain and surrounding structures were obtained without intravenous contrast. Angiographic images of the head were obtained using MRA technique without contrast. COMPARISON:  CT, CTA, and CTP 07/05/2017.  Brain MRI 04/27/2016 FINDINGS: MRI HEAD FINDINGS  Brain: Cortically based restricted  diffusion in the posterior left insula, superior temporal lobe, parietal lobe, and minimally involving the posterior left frontal lobe. There is good correlation with infarct by CT perfusion. Small remote infarcts in the left superior cerebellum, left paramedian pons, left basal ganglia/internal capsule, and right temporal parietal cortex. There is chronic blood products at the remote left basal ganglia infarct. No hemorrhage seen at the acute infarct. No hydrocephalus or masslike finding. Vascular: Arterial findings below. Normal dural venous sinus flow voids. Skull and upper cervical spine: Negative Sinuses/Orbits: Negative MRA HEAD FINDINGS Symmetric carotid and vertebral arteries. The vertebral and basilar arteries are smooth and diffusely patent. Atheromatous irregularity of bilateral posterior cerebral arteries with high-grade left P2 segment narrowing. Essentially nonvisualized bilateral ACA attributed to advanced atheromatous narrowing based on prior CTA. Less robust flow signal seen in the left MCA with continued nonvisualization of the inferior division. The left superior division and right MCA vasculature show diffuse atheromatous irregularity. IMPRESSION: 1. Acute left MCA territory cortical infarct without detected progression when compared to CTP from yesterday. No hemorrhagic conversion. 2. Continued non visualization of the left MCA inferior division. 3. There is advanced intracranial atherosclerosis with nonvisualized ACAs and severe left P2 segment stenosis. 4. Remote small vessel infarcts as described. Electronically Signed   By: Marnee Spring M.D.   On: 07/06/2017 12:59   Ir Ct Head Ltd  Result Date: 07/07/2017 INDICATION: Flaccid right and arm leg. Left gaze deviation. Dysarthria. Occluded inferior division of the left middle cerebral artery. EXAM: 1. EMERGENT LARGE VESSEL OCCLUSION THROMBOLYSIS (anterior CIRCULATION) COMPARISON:  CT angiogram of the head and neck of 07/05/2017.  MEDICATIONS: Ancef 2 g IV antibiotic was administered within 1 hour of the procedure. ANESTHESIA/SEDATION: General anesthesia. CONTRAST:  Isovue 300 approximately 70 cc. FLUOROSCOPY TIME:  Fluoroscopy Time: 27 minutes 12 seconds (2951 mGy). COMPLICATIONS: None immediate. TECHNIQUE: Following a full explanation of the procedure along with the potential associated complications, an informed witnessed consent was obtained. The risks of intracranial hemorrhage of 10%, worsening neurological deficit, ventilator dependency, death and inability to revascularize were all reviewed in detail with the patient's family. The patient was then put under general anesthesia by the Department of Anesthesiology at [facility]. The right groin was prepped and draped in the usual sterile fashion. Thereafter using modified Seldinger technique, transfemoral access into the right common femoral artery was obtained without difficulty. Over a 0.035 inch guidewire a 5 French Pinnacle sheath was inserted. Through this, and also over a 0.035 inch guidewire a 5 [place] JB 1 catheter was advanced to the aortic arch region and selectively positioned in the right common carotid artery, the right vertebral artery, the left vertebral artery and the left common carotid artery. FINDINGS: The left common carotid arteriogram demonstrates approximately 50% stenosis of the mid segment of the left common carotid artery. The left external carotid artery and its major branches are widely patent. The left internal carotid artery at the bulb demonstrates wide patency. More distally, there is mild decreased caliber of the left internal carotid artery in its entirety. The petrous, cavernous and supraclinoid segments demonstrate wide patency. The left anterior cerebral artery demonstrates complete occlusion distal to the left A1 segment. This is associated with caliber irregularity in the A1 segment. The left middle cerebral artery M1 segment proximally  demonstrates mild caliber irregularity. There is patency of the superior division of the left middle cerebral artery with delayed reconstitution of the distal inferior division of the left middle  cerebral artery in the M2 M3 regions. Distal to this the M3 branches and the M4 branches demonstrated gradual ascent of contrast. The inferior division appears to be completely occluded associated with a stump. A diminutive anterior temporal branch is noted. A prominent anterior choroidal artery on the left is noted with delayed arterial blush noted in the region of the splenium of the corpus callosum. The whole head delayed arterial phase in the lateral projection demonstrates a prominent area of hypoperfusion involving the left parietal cortical and subcortical regions. The left vertebral artery origin is from the thyrocervical trunk. The vessel is seen to opacify to the cranial skull base. Wide patency is seen of the left vertebrobasilar junction and the left posterior inferior cerebellar artery. The basilar artery, the posterior cerebral arteries, the superior cerebellar arteries and the anterior-inferior cerebellar arteries opacify into the capillary and venous phases. Prominent collaterals are seen in the P3 region of the left posterior cerebral artery retrogradely filling the posterior 1/3 of the left parietal cortical and subcortical areas. The delayed arterial phase then demonstrates retrograde opacification of the distal pericallosal artery in the splenium of the corpus callosum from the lateral thalamo perforator branches. Also demonstrated is retrograde opacification of the posterior half of the anterior temporal region from the posterior temporal branches of the P1 P2 region of the left posterior cerebral artery. The right common carotid arteriogram demonstrates mild narrowing in the mid third of the right common carotid artery. The right external carotid artery and its major branches are widely patent. The  right internal carotid artery at the bulb to the cranial skull base is seen to opacify unimpeded. The petrous segment is normal. There is mild narrowing of the caval cavernous segment of the right internal carotid artery. Distal to this the supraclinoid segment demonstrates mild decrease in caliber. The right middle cerebral artery demonstrates a 50% stenosis in the distal right M1 segment. The proximal superior and the inferior divisions demonstrate moderate stenosis. Again demonstrated is a prominent anterior choroidal artery on the right side. The delayed arterial phase demonstrates hypoperfusion of the parietal cortical and subcortical regions. The right vertebral artery origin is from the right thyrocervical trunk. Again this vessel is seen to opacify to the cranial skull base. Wide patency is seen of the right vertebrobasilar junction and the right posterior-inferior cerebellar artery. Multiple focal areas of caliber irregularity are seen involving the posterior-inferior cerebellar artery. The opacified portion of the basilar artery, the proximal posterior cerebral arteries, the superior cerebellar arteries and the anterior-inferior cerebellar arteries are seen to opacify into the capillary and venous phases. Extensive collaterals are again seen from the P3 regions of both posterior cerebral arteries contributing to the posterior parietal middle cerebral artery distributions bilaterally as described earlier. PROCEDURE: ENDOVASCULAR TREATMENT OF OCCLUDED DOMINANT INFERIOR DIVISION OF THE LEFT MIDDLE CEREBRAL ARTERY The diagnostic JB 1 catheter in the left common carotid artery was exchanged over a 0.035 inch 8 French 55 cm Brite tip neurovascular sheath using biplane roadmap technique and constant fluoroscopic guidance. Good aspiration obtained from the hub of the 8 [place] Brite tip neurovascular sheath. A gentle contrast injection demonstrated no evidence of spasms, dissections or of intraluminal filling  defects. This was then connected to continuous heparinized saline infusion. Over the [facility] exchange guidewire, an 85 cm 8 [place] of balloon FlowGate guide catheter which had been prepped with 50% contrast and 50% heparinized saline infusion was advanced and positioned just proximal to the left common carotid bifurcation. The guidewire was  removed. Good aspiration was obtained at the hub of the [facility] guide catheter. Over a 0.035 inch Roadrunner guidewire, using biplane roadmap technique and constant fluoroscopic guidance, the FlowGate guide catheter was then advanced to the mid cervical left ICA. The guidewire was removed. Again good aspiration was obtained from the hub of the 8 [place] FlowGate guide catheter. A gentle control arteriogram performed centered extracranially and intracranially demonstrated no change in the intracranial circulation. Over a 0.014 inch Softip Synchro micro guidewire, a Trevo ProVue 021 microcatheter was advanced to the distal end of the of FlowGate guide catheter. With the micro guidewire leading with a J-tip configuration, the combination was navigated to the supraclinoid left ICA. The micro guidewire was then advanced gently into the inferior division origin followed by the microcatheter. A gentle manipulation of the micro guidewire was then performed using a torque device to encourage advancement through the occluded left middle cerebral artery. The microcatheter was then brought into close proximity to the site of the occlusion. Further manipulation was performed with partial advancement of the micro guidewire through the occluded left middle cerebral artery inferior division. To avoid potential perforation of the more distal inferior division the micro guidewire was retrieved. Good aspiration obtained from the hub of the microcatheter. Approximately 3.6 mg of super selective intracranial intra-arterial Integrilin was infused over a couple of minutes. Thereafter, further attempts  were made to encourage advancement of a very soft microguidewire again with only partial success. 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 The procedure was then stopped. The final control arteriogram performed through the [facility] guide catheter in the left internal carotid artery continued to demonstrate no significant change in the intracranial circulation. No angiographic evidence of extravasation or mass-effect or midline shift was seen. A Dyna CT of the head performed revealed no evidence of hemorrhage or change in the ventricular size or mass effect or midline shift. The [facility] guide catheter and the neurovascular sheath were then exchanged over a J-tip 0.035 guidewire for an 8 [place] Pinnacle sheath. This was then removed with successful hemostasis being achieved by manual compression. At the end of the procedure, the patient's right groin appeared soft without evidence of bleeding or hematoma. Distal pulses continued to be palpable in the dorsalis pedis, and the posterior tibial regions unchanged from prior to the procedure. The patient's general anesthesia was then reversed. The patient was extubated without difficulty. Upon recovery the patient was noted to be able to raise his right arm, and also bend his right knee. He responded appropriately to simple commands. He was then transferred to the PACU and then to neuro ICU for further management. IMPRESSION: Status post attempted revascularization of occluded dominant inferior division of the right middle cerebral artery using 3.6 mg of super selective intracranial intra-arterial Integrilin and mechanical thrombolysis with a micro guidewire. PLAN: Further management as per stroke team. Electronically Signed   By: Julieanne Cotton M.D.   On: 07/06/2017 10:05   Ct Cerebral Perfusion W Contrast  Result Date: 07/05/2017 CLINICAL DATA:  Right hemiparesis and difficulty speaking. EXAM: CT ANGIOGRAPHY HEAD AND NECK CT PERFUSION BRAIN  TECHNIQUE: Multidetector CT imaging of the head and neck was performed using the standard protocol during bolus administration of intravenous contrast. Multiplanar CT image reconstructions and MIPs were obtained to evaluate the vascular anatomy. Carotid stenosis measurements (when applicable) are obtained utilizing NASCET criteria, using the distal internal carotid diameter as the denominator. Multiphase CT imaging of the brain was performed following IV  bolus contrast injection. Subsequent parametric perfusion maps were calculated using RAPID software. CONTRAST:  ISOVUE-370 IOPAMIDOL (ISOVUE-370) INJECTION 76% COMPARISON:  Head MRA 12/29/2013.  Neck MRA 06/30/2005 FINDINGS: CTA NECK FINDINGS Aortic arch: Standard 3 vessel aortic arch. Widely patent arch vessel origins. Right carotid system: Patent with predominantly soft plaque in the common carotid artery resulting in less than 50% narrowing. Minimal calcified plaque at the carotid bifurcation. No ICA stenosis. Left carotid system: Patent with predominantly soft plaque in the distal common carotid artery resulting in 50% stenosis. Moderate calcified plaque about the carotid bifurcation. No ICA stenosis. Vertebral arteries: Patent with the left being slightly dominant. Mild proximal right V2 stenosis due to calcified plaque. Calcified and soft plaque in the left V1 segment with mild multifocal stenoses. Skeleton: Mild cervical disc degeneration. Other neck: Multiple thyroid nodules including a 2.9 cm exophytic. Nodule extending inferiorly from the isthmus. Upper chest: Clear lung apices. Review of the MIP images confirms the above findings CTA HEAD FINDINGS Anterior circulation: The internal carotid arteries are patent from skull base to carotid termini with calcified plaque resulting in mild cavernous and supraclinoid stenosis on the right. The M1 segments are patent with mild stenosis on the right. The left M2 inferior division is occluded at its origin  with some reconstitution of more distal M2 and M3 branches, however there are numerous missing distal branches in the left parietal region corresponding to the acute infarct. The prior MRA also suggested a severe stenosis or possibly short segment occlusion of the left M2 inferior division, however motion artifact on that study makes it difficult to make a detailed assessment of interval changes. There are also likely some missing distal left MCA superior division branch vessels. There also severe proximal right M2 stenoses which are at least partially chronic. The right A1 segment is occluded. The left A1 segment is patent but demonstrates a severe proximal stenosis. Both A2 segments are heavily diseased with multifocal severe stenoses and or short segment occlusions. The appearance of the ACAs is grossly similar to the prior motion degraded MRA. No aneurysm. Posterior circulation: The intracranial vertebral arteries are patent to the basilar. There is right greater than left V4 segment calcified plaque without significant stenosis. Patent SCA origins are identified bilaterally. PICA and AICA origins are not well evaluated. The basilar artery is widely patent. Posterior communicating arteries are diminutive or absent. PCAs are patent with mild left P2 stenosis. No aneurysm. Venous sinuses: Inadequately assessed due to arterial phase contrast timing. Anatomic variants: None. Delayed phase: Not performed. Review of the MIP images confirms the above findings CT Brain Perfusion Findings: CBF (<30%) Volume: 28mL Perfusion (Tmax>6.0s) volume: , however a substantial amount of this is located in the right cerebral hemisphere Mismatch Volume: though with the caveat that this is artifactually inflated due to (likely chronic) prolonged perfusion in the right cerebral hemisphere. There is still however an apparent sizable penumbra in the posterior left cerebral hemisphere. Infarction Location:Predominantly left  parietal lobe with a small amount of posterior left frontal lobe, posterior left temporal lobe, and posterior insula involvement. IMPRESSION: 1. 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 2. Left M2 inferior division occlusion at its origin with some distal reconstitution. Severe stenosis (or possibly short segment occlusion) in this location on a prior MRA. Numerous missing distal branch vessels in the left parietal region corresponding to the acute infarct. 3. Advanced intracranial atherosclerosis with numerous severe stenoses as above. 4. 50% left common carotid artery stenosis. 5.  Aortic Atherosclerosis (ICD10-I70.0). 6. Thyroid nodules measuring up to 2.9 cm. Nonemergent thyroid ultrasound recommended for further evaluation. These results were communicated to Dr. Amada Jupiter at 10:18 pm on 07/05/2017 by text page via the [facility] messaging system. Electronically Signed   By: Sebastian Ache M.D.   On: 07/05/2017 22:47   Dg Chest Port 1 View  Result Date: 07/06/2017 CLINICAL DATA:  Cardiomyopathy, coronary artery disease with stent placement, chronic renal insufficiency. EXAM: PORTABLE CHEST 1 VIEW COMPARISON:  Chest x-ray of March 04, 2017 FINDINGS: The lungs are borderline hypoinflated but clear. The heart is top-normal in size. The pulmonary vascularity is normal. There is no pleural effusion. There are post CABG changes. There is a vascular graft in the left axillary region. The bony thorax exhibits no acute abnormality. IMPRESSION: Top-normal cardiac size without pulmonary edema or pleural effusion. No pneumonia. Electronically Signed   By: David  [place] M.D.   On: 07/06/2017 07:57   Ir Percutaneous Art Thrombectomy/infusion Intracranial Inc Diag Angio  Result Date: 07/07/2017 INDICATION: Flaccid right and arm leg. Left gaze deviation. Dysarthria. Occluded inferior division of the left middle cerebral artery. EXAM: 1. EMERGENT LARGE VESSEL OCCLUSION THROMBOLYSIS (anterior CIRCULATION)  COMPARISON:  CT angiogram of the head and neck of 07/05/2017. MEDICATIONS: Ancef 2 g IV antibiotic was administered within 1 hour of the procedure. ANESTHESIA/SEDATION: General anesthesia. CONTRAST:  Isovue 300 approximately 70 cc. FLUOROSCOPY TIME:  Fluoroscopy Time: 27 minutes 12 seconds (2951 mGy). COMPLICATIONS: None immediate. TECHNIQUE: Following a full explanation of the procedure along with the potential associated complications, an informed witnessed consent was obtained. The risks of intracranial hemorrhage of 10%, worsening neurological deficit, ventilator dependency, death and inability to revascularize were all reviewed in detail with the patient's family. The patient was then put under general anesthesia by the Department of Anesthesiology at [facility]. The right groin was prepped and draped in the usual sterile fashion. Thereafter using modified Seldinger technique, transfemoral access into the right common femoral artery was obtained without difficulty. Over a 0.035 inch guidewire a 5 French Pinnacle sheath was inserted. Through this, and also over a 0.035 inch guidewire a 5 [place] JB 1 catheter was advanced to the aortic arch region and selectively positioned in the right common carotid artery, the right vertebral artery, the left vertebral artery and the left common carotid artery. FINDINGS: The left common carotid arteriogram demonstrates approximately 50% stenosis of the mid segment of the left common carotid artery. The left external carotid artery and its major branches are widely patent. The left internal carotid artery at the bulb demonstrates wide patency. More distally, there is mild decreased caliber of the left internal carotid artery in its entirety. The petrous, cavernous and supraclinoid segments demonstrate wide patency. The left anterior cerebral artery demonstrates complete occlusion distal to the left A1 segment. This is associated with caliber irregularity in the A1  segment. The left middle cerebral artery M1 segment proximally demonstrates mild caliber irregularity. There is patency of the superior division of the left middle cerebral artery with delayed reconstitution of the distal inferior division of the left middle cerebral artery in the M2 M3 regions. Distal to this the M3 branches and the M4 branches demonstrated gradual ascent of contrast. The inferior division appears to be completely occluded associated with a stump. A diminutive anterior temporal branch is noted. A prominent anterior choroidal artery on the left is noted with delayed arterial blush noted in the region of the splenium of the corpus callosum. The whole  head delayed arterial phase in the lateral projection demonstrates a prominent area of hypoperfusion involving the left parietal cortical and subcortical regions. The left vertebral artery origin is from the thyrocervical trunk. The vessel is seen to opacify to the cranial skull base. Wide patency is seen of the left vertebrobasilar junction and the left posterior inferior cerebellar artery. The basilar artery, the posterior cerebral arteries, the superior cerebellar arteries and the anterior-inferior cerebellar arteries opacify into the capillary and venous phases. Prominent collaterals are seen in the P3 region of the left posterior cerebral artery retrogradely filling the posterior 1/3 of the left parietal cortical and subcortical areas. The delayed arterial phase then demonstrates retrograde opacification of the distal pericallosal artery in the splenium of the corpus callosum from the lateral thalamo perforator branches. Also demonstrated is retrograde opacification of the posterior half of the anterior temporal region from the posterior temporal branches of the P1 P2 region of the left posterior cerebral artery. The right common carotid arteriogram demonstrates mild narrowing in the mid third of the right common carotid artery. The right external  carotid artery and its major branches are widely patent. The right internal carotid artery at the bulb to the cranial skull base is seen to opacify unimpeded. The petrous segment is normal. There is mild narrowing of the caval cavernous segment of the right internal carotid artery. Distal to this the supraclinoid segment demonstrates mild decrease in caliber. The right middle cerebral artery demonstrates a 50% stenosis in the distal right M1 segment. The proximal superior and the inferior divisions demonstrate moderate stenosis. Again demonstrated is a prominent anterior choroidal artery on the right side. The delayed arterial phase demonstrates hypoperfusion of the parietal cortical and subcortical regions. The right vertebral artery origin is from the right thyrocervical trunk. Again this vessel is seen to opacify to the cranial skull base. Wide patency is seen of the right vertebrobasilar junction and the right posterior-inferior cerebellar artery. Multiple focal areas of caliber irregularity are seen involving the posterior-inferior cerebellar artery. The opacified portion of the basilar artery, the proximal posterior cerebral arteries, the superior cerebellar arteries and the anterior-inferior cerebellar arteries are seen to opacify into the capillary and venous phases. Extensive collaterals are again seen from the P3 regions of both posterior cerebral arteries contributing to the posterior parietal middle cerebral artery distributions bilaterally as described earlier. PROCEDURE: ENDOVASCULAR TREATMENT OF OCCLUDED DOMINANT INFERIOR DIVISION OF THE LEFT MIDDLE CEREBRAL ARTERY The diagnostic JB 1 catheter in the left common carotid artery was exchanged over a 0.035 inch 8 French 55 cm Brite tip neurovascular sheath using biplane roadmap technique and constant fluoroscopic guidance. Good aspiration obtained from the hub of the 8 [place] Brite tip neurovascular sheath. A gentle contrast injection demonstrated no  evidence of spasms, dissections or of intraluminal filling defects. This was then connected to continuous heparinized saline infusion. Over the [facility] exchange guidewire, an 85 cm 8 [place] of balloon FlowGate guide catheter which had been prepped with 50% contrast and 50% heparinized saline infusion was advanced and positioned just proximal to the left common carotid bifurcation. The guidewire was removed. Good aspiration was obtained at the hub of the [facility] guide catheter. Over a 0.035 inch Roadrunner guidewire, using biplane roadmap technique and constant fluoroscopic guidance, the FlowGate guide catheter was then advanced to the mid cervical left ICA. The guidewire was removed. Again good aspiration was obtained from the hub of the 8 [place] FlowGate guide catheter. A gentle control arteriogram performed centered extracranially and intracranially  demonstrated no change in the intracranial circulation. Over a 0.014 inch Softip Synchro micro guidewire, a Trevo ProVue 021 microcatheter was advanced to the distal end of the of FlowGate guide catheter. With the micro guidewire leading with a J-tip configuration, the combination was navigated to the supraclinoid left ICA. The micro guidewire was then advanced gently into the inferior division origin followed by the microcatheter. A gentle manipulation of the micro guidewire was then performed using a torque device to encourage advancement through the occluded left middle cerebral artery. The microcatheter was then brought into close proximity to the site of the occlusion. Further manipulation was performed with partial advancement of the micro guidewire through the occluded left middle cerebral artery inferior division. To avoid potential perforation of the more distal inferior division the micro guidewire was retrieved. Good aspiration obtained from the hub of the microcatheter. Approximately 3.6 mg of super selective intracranial intra-arterial Integrilin was  infused over a couple of minutes. Thereafter, further attempts were made to encourage advancement of a very soft microguidewire again with only partial success. 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 The procedure was then stopped. The final control arteriogram performed through the [facility] guide catheter in the left internal carotid artery continued to demonstrate no significant change in the intracranial circulation. No angiographic evidence of extravasation or mass-effect or midline shift was seen. A Dyna CT of the head performed revealed no evidence of hemorrhage or change in the ventricular size or mass effect or midline shift. The [facility] guide catheter and the neurovascular sheath were then exchanged over a J-tip 0.035 guidewire for an 8 [place] Pinnacle sheath. This was then removed with successful hemostasis being achieved by manual compression. At the end of the procedure, the patient's right groin appeared soft without evidence of bleeding or hematoma. Distal pulses continued to be palpable in the dorsalis pedis, and the posterior tibial regions unchanged from prior to the procedure. The patient's general anesthesia was then reversed. The patient was extubated without difficulty. Upon recovery the patient was noted to be able to raise his right arm, and also bend his right knee. He responded appropriately to simple commands. He was then transferred to the PACU and then to neuro ICU for further management. IMPRESSION: Status post attempted revascularization of occluded dominant inferior division of the right middle cerebral artery using 3.6 mg of super selective intracranial intra-arterial Integrilin and mechanical thrombolysis with a micro guidewire. PLAN: Further management as per stroke team. Electronically Signed   By: Julieanne Cotton M.D.   On: 07/06/2017 10:05   Ct Head Code Stroke Wo Contrast  Result Date: 07/05/2017 CLINICAL DATA:  Code stroke. Right hemiparesis and  difficulty speaking. EXAM: CT HEAD WITHOUT CONTRAST TECHNIQUE: Contiguous axial images were obtained from the base of the skull through the vertex without intravenous contrast. COMPARISON:  Brain MRI 04/27/2016 FINDINGS: Brain: There is loss of gray-white differentiation consistent with acute infarction involving the posterior left insula and left parietal lobe extending towards the vertex. No acute intracranial hemorrhage, mass, midline shift, or extra-axial fluid collection is identified. Chronic lacunar infarcts are present in the basal ganglia and thalami bilaterally. There is a chronic right temporoparietal infarct. There is a small chronic left cerebellar infarct. Patchy cerebral white matter hypodensities are nonspecific but compatible with chronic small vessel ischemic disease, moderately advanced for age. Mild cerebral atrophy is advanced for age. Vascular: Calcified atherosclerosis at the skull base. No hyperdense vessel. Skull: No fracture or focal osseous lesion. Sinuses/Orbits:  Visualized paranasal sinuses and mastoid air cells are clear. Orbits are unremarkable. Other: None. ASPECTS [facility] Stroke Program Early CT Score) - Ganglionic level infarction (caudate, lentiform nuclei, internal capsule, insula, M1-M3 cortex): 5-6 - Supraganglionic infarction (M4-M6 cortex): 2 Total score (0-10 with 10 being normal): 7-8 IMPRESSION: 1. Acute left MCA infarct involving the insula and parietal lobe. No hemorrhage. 2. ASPECTS is 7-8. 3. Age advanced chronic ischemia. These results were communicated to Dr. Amada Jupiter at 9:54 pm on 07/05/2017 by text page via the [facility] messaging system. Electronically Signed   By: Sebastian Ache M.D.   On: 07/05/2017 21:59   Ir Angio Intra Extracran Sel Com Carotid Innominate Uni R Mod Sed  Result Date: 07/07/2017 INDICATION: Flaccid right and arm leg. Left gaze deviation. Dysarthria. Occluded inferior division of the left middle cerebral artery. EXAM: 1. EMERGENT LARGE VESSEL  OCCLUSION THROMBOLYSIS (anterior CIRCULATION) COMPARISON:  CT angiogram of the head and neck of 07/05/2017. MEDICATIONS: Ancef 2 g IV antibiotic was administered within 1 hour of the procedure. ANESTHESIA/SEDATION: General anesthesia. CONTRAST:  Isovue 300 approximately 70 cc. FLUOROSCOPY TIME:  Fluoroscopy Time: 27 minutes 12 seconds (2951 mGy). COMPLICATIONS: None immediate. TECHNIQUE: Following a full explanation of the procedure along with the potential associated complications, an informed witnessed consent was obtained. The risks of intracranial hemorrhage of 10%, worsening neurological deficit, ventilator dependency, death and inability to revascularize were all reviewed in detail with the patient's family. The patient was then put under general anesthesia by the Department of Anesthesiology at [facility]. The right groin was prepped and draped in the usual sterile fashion. Thereafter using modified Seldinger technique, transfemoral access into the right common femoral artery was obtained without difficulty. Over a 0.035 inch guidewire a 5 French Pinnacle sheath was inserted. Through this, and also over a 0.035 inch guidewire a 5 [place] JB 1 catheter was advanced to the aortic arch region and selectively positioned in the right common carotid artery, the right vertebral artery, the left vertebral artery and the left common carotid artery. FINDINGS: The left common carotid arteriogram demonstrates approximately 50% stenosis of the mid segment of the left common carotid artery. The left external carotid artery and its major branches are widely patent. The left internal carotid artery at the bulb demonstrates wide patency. More distally, there is mild decreased caliber of the left internal carotid artery in its entirety. The petrous, cavernous and supraclinoid segments demonstrate wide patency. The left anterior cerebral artery demonstrates complete occlusion distal to the left A1 segment. This is  associated with caliber irregularity in the A1 segment. The left middle cerebral artery M1 segment proximally demonstrates mild caliber irregularity. There is patency of the superior division of the left middle cerebral artery with delayed reconstitution of the distal inferior division of the left middle cerebral artery in the M2 M3 regions. Distal to this the M3 branches and the M4 branches demonstrated gradual ascent of contrast. The inferior division appears to be completely occluded associated with a stump. A diminutive anterior temporal branch is noted. A prominent anterior choroidal artery on the left is noted with delayed arterial blush noted in the region of the splenium of the corpus callosum. The whole head delayed arterial phase in the lateral projection demonstrates a prominent area of hypoperfusion involving the left parietal cortical and subcortical regions. The left vertebral artery origin is from the thyrocervical trunk. The vessel is seen to opacify to the cranial skull base. Wide patency is seen of the left vertebrobasilar junction  and the left posterior inferior cerebellar artery. The basilar artery, the posterior cerebral arteries, the superior cerebellar arteries and the anterior-inferior cerebellar arteries opacify into the capillary and venous phases. Prominent collaterals are seen in the P3 region of the left posterior cerebral artery retrogradely filling the posterior 1/3 of the left parietal cortical and subcortical areas. The delayed arterial phase then demonstrates retrograde opacification of the distal pericallosal artery in the splenium of the corpus callosum from the lateral thalamo perforator branches. Also demonstrated is retrograde opacification of the posterior half of the anterior temporal region from the posterior temporal branches of the P1 P2 region of the left posterior cerebral artery. The right common carotid arteriogram demonstrates mild narrowing in the mid third of the  right common carotid artery. The right external carotid artery and its major branches are widely patent. The right internal carotid artery at the bulb to the cranial skull base is seen to opacify unimpeded. The petrous segment is normal. There is mild narrowing of the caval cavernous segment of the right internal carotid artery. Distal to this the supraclinoid segment demonstrates mild decrease in caliber. The right middle cerebral artery demonstrates a 50% stenosis in the distal right M1 segment. The proximal superior and the inferior divisions demonstrate moderate stenosis. Again demonstrated is a prominent anterior choroidal artery on the right side. The delayed arterial phase demonstrates hypoperfusion of the parietal cortical and subcortical regions. The right vertebral artery origin is from the right thyrocervical trunk. Again this vessel is seen to opacify to the cranial skull base. Wide patency is seen of the right vertebrobasilar junction and the right posterior-inferior cerebellar artery. Multiple focal areas of caliber irregularity are seen involving the posterior-inferior cerebellar artery. The opacified portion of the basilar artery, the proximal posterior cerebral arteries, the superior cerebellar arteries and the anterior-inferior cerebellar arteries are seen to opacify into the capillary and venous phases. Extensive collaterals are again seen from the P3 regions of both posterior cerebral arteries contributing to the posterior parietal middle cerebral artery distributions bilaterally as described earlier. PROCEDURE: ENDOVASCULAR TREATMENT OF OCCLUDED DOMINANT INFERIOR DIVISION OF THE LEFT MIDDLE CEREBRAL ARTERY The diagnostic JB 1 catheter in the left common carotid artery was exchanged over a 0.035 inch 8 French 55 cm Brite tip neurovascular sheath using biplane roadmap technique and constant fluoroscopic guidance. Good aspiration obtained from the hub of the 8 [place] Brite tip neurovascular sheath.  A gentle contrast injection demonstrated no evidence of spasms, dissections or of intraluminal filling defects. This was then connected to continuous heparinized saline infusion. Over the [facility] exchange guidewire, an 85 cm 8 [place] of balloon FlowGate guide catheter which had been prepped with 50% contrast and 50% heparinized saline infusion was advanced and positioned just proximal to the left common carotid bifurcation. The guidewire was removed. Good aspiration was obtained at the hub of the [facility] guide catheter. Over a 0.035 inch Roadrunner guidewire, using biplane roadmap technique and constant fluoroscopic guidance, the FlowGate guide catheter was then advanced to the mid cervical left ICA. The guidewire was removed. Again good aspiration was obtained from the hub of the 8 [place] FlowGate guide catheter. A gentle control arteriogram performed centered extracranially and intracranially demonstrated no change in the intracranial circulation. Over a 0.014 inch Softip Synchro micro guidewire, a Trevo ProVue 021 microcatheter was advanced to the distal end of the of FlowGate guide catheter. With the micro guidewire leading with a J-tip configuration, the combination was navigated to the supraclinoid left ICA. The micro  guidewire was then advanced gently into the inferior division origin followed by the microcatheter. A gentle manipulation of the micro guidewire was then performed using a torque device to encourage advancement through the occluded left middle cerebral artery. The microcatheter was then brought into close proximity to the site of the occlusion. Further manipulation was performed with partial advancement of the micro guidewire through the occluded left middle cerebral artery inferior division. To avoid potential perforation of the more distal inferior division the micro guidewire was retrieved. Good aspiration obtained from the hub of the microcatheter. Approximately 3.6 mg of super selective  intracranial intra-arterial Integrilin was infused over a couple of minutes. Thereafter, further attempts were made to encourage advancement of a very soft microguidewire again with only partial success. 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 The procedure was then stopped. The final control arteriogram performed through the [facility] guide catheter in the left internal carotid artery continued to demonstrate no significant change in the intracranial circulation. No angiographic evidence of extravasation or mass-effect or midline shift was seen. A Dyna CT of the head performed revealed no evidence of hemorrhage or change in the ventricular size or mass effect or midline shift. The [facility] guide catheter and the neurovascular sheath were then exchanged over a J-tip 0.035 guidewire for an 8 [place] Pinnacle sheath. This was then removed with successful hemostasis being achieved by manual compression. At the end of the procedure, the patient's right groin appeared soft without evidence of bleeding or hematoma. Distal pulses continued to be palpable in the dorsalis pedis, and the posterior tibial regions unchanged from prior to the procedure. The patient's general anesthesia was then reversed. The patient was extubated without difficulty. Upon recovery the patient was noted to be able to raise his right arm, and also bend his right knee. He responded appropriately to simple commands. He was then transferred to the PACU and then to neuro ICU for further management. IMPRESSION: Status post attempted revascularization of occluded dominant inferior division of the right middle cerebral artery using 3.6 mg of super selective intracranial intra-arterial Integrilin and mechanical thrombolysis with a micro guidewire. PLAN: Further management as per stroke team. Electronically Signed   By: Julieanne Cotton M.D.   On: 07/06/2017 10:05   Ir Angio Vertebral Sel Vertebral Bilat Mod Sed  Result Date:  07/07/2017 INDICATION: Flaccid right and arm leg. Left gaze deviation. Dysarthria. Occluded inferior division of the left middle cerebral artery. EXAM: 1. EMERGENT LARGE VESSEL OCCLUSION THROMBOLYSIS (anterior CIRCULATION) COMPARISON:  CT angiogram of the head and neck of 07/05/2017. MEDICATIONS: Ancef 2 g IV antibiotic was administered within 1 hour of the procedure. ANESTHESIA/SEDATION: General anesthesia. CONTRAST:  Isovue 300 approximately 70 cc. FLUOROSCOPY TIME:  Fluoroscopy Time: 27 minutes 12 seconds (2951 mGy). COMPLICATIONS: None immediate. TECHNIQUE: Following a full explanation of the procedure along with the potential associated complications, an informed witnessed consent was obtained. The risks of intracranial hemorrhage of 10%, worsening neurological deficit, ventilator dependency, death and inability to revascularize were all reviewed in detail with the patient's family. The patient was then put under general anesthesia by the Department of Anesthesiology at [facility]. The right groin was prepped and draped in the usual sterile fashion. Thereafter using modified Seldinger technique, transfemoral access into the right common femoral artery was obtained without difficulty. Over a 0.035 inch guidewire a 5 French Pinnacle sheath was inserted. Through this, and also over a 0.035 inch guidewire a 5 [place] JB 1 catheter was  advanced to the aortic arch region and selectively positioned in the right common carotid artery, the right vertebral artery, the left vertebral artery and the left common carotid artery. FINDINGS: The left common carotid arteriogram demonstrates approximately 50% stenosis of the mid segment of the left common carotid artery. The left external carotid artery and its major branches are widely patent. The left internal carotid artery at the bulb demonstrates wide patency. More distally, there is mild decreased caliber of the left internal carotid artery in its entirety. The  petrous, cavernous and supraclinoid segments demonstrate wide patency. The left anterior cerebral artery demonstrates complete occlusion distal to the left A1 segment. This is associated with caliber irregularity in the A1 segment. The left middle cerebral artery M1 segment proximally demonstrates mild caliber irregularity. There is patency of the superior division of the left middle cerebral artery with delayed reconstitution of the distal inferior division of the left middle cerebral artery in the M2 M3 regions. Distal to this the M3 branches and the M4 branches demonstrated gradual ascent of contrast. The inferior division appears to be completely occluded associated with a stump. A diminutive anterior temporal branch is noted. A prominent anterior choroidal artery on the left is noted with delayed arterial blush noted in the region of the splenium of the corpus callosum. The whole head delayed arterial phase in the lateral projection demonstrates a prominent area of hypoperfusion involving the left parietal cortical and subcortical regions. The left vertebral artery origin is from the thyrocervical trunk. The vessel is seen to opacify to the cranial skull base. Wide patency is seen of the left vertebrobasilar junction and the left posterior inferior cerebellar artery. The basilar artery, the posterior cerebral arteries, the superior cerebellar arteries and the anterior-inferior cerebellar arteries opacify into the capillary and venous phases. Prominent collaterals are seen in the P3 region of the left posterior cerebral artery retrogradely filling the posterior 1/3 of the left parietal cortical and subcortical areas. The delayed arterial phase then demonstrates retrograde opacification of the distal pericallosal artery in the splenium of the corpus callosum from the lateral thalamo perforator branches. Also demonstrated is retrograde opacification of the posterior half of the anterior temporal region from the  posterior temporal branches of the P1 P2 region of the left posterior cerebral artery. The right common carotid arteriogram demonstrates mild narrowing in the mid third of the right common carotid artery. The right external carotid artery and its major branches are widely patent. The right internal carotid artery at the bulb to the cranial skull base is seen to opacify unimpeded. The petrous segment is normal. There is mild narrowing of the caval cavernous segment of the right internal carotid artery. Distal to this the supraclinoid segment demonstrates mild decrease in caliber. The right middle cerebral artery demonstrates a 50% stenosis in the distal right M1 segment. The proximal superior and the inferior divisions demonstrate moderate stenosis. Again demonstrated is a prominent anterior choroidal artery on the right side. The delayed arterial phase demonstrates hypoperfusion of the parietal cortical and subcortical regions. The right vertebral artery origin is from the right thyrocervical trunk. Again this vessel is seen to opacify to the cranial skull base. Wide patency is seen of the right vertebrobasilar junction and the right posterior-inferior cerebellar artery. Multiple focal areas of caliber irregularity are seen involving the posterior-inferior cerebellar artery. The opacified portion of the basilar artery, the proximal posterior cerebral arteries, the superior cerebellar arteries and the anterior-inferior cerebellar arteries are seen to opacify into the  capillary and venous phases. Extensive collaterals are again seen from the P3 regions of both posterior cerebral arteries contributing to the posterior parietal middle cerebral artery distributions bilaterally as described earlier. PROCEDURE: ENDOVASCULAR TREATMENT OF OCCLUDED DOMINANT INFERIOR DIVISION OF THE LEFT MIDDLE CEREBRAL ARTERY The diagnostic JB 1 catheter in the left common carotid artery was exchanged over a 0.035 inch 8 French 55 cm Brite  tip neurovascular sheath using biplane roadmap technique and constant fluoroscopic guidance. Good aspiration obtained from the hub of the 8 [place] Brite tip neurovascular sheath. A gentle contrast injection demonstrated no evidence of spasms, dissections or of intraluminal filling defects. This was then connected to continuous heparinized saline infusion. Over the [facility] exchange guidewire, an 85 cm 8 [place] of balloon FlowGate guide catheter which had been prepped with 50% contrast and 50% heparinized saline infusion was advanced and positioned just proximal to the left common carotid bifurcation. The guidewire was removed. Good aspiration was obtained at the hub of the [facility] guide catheter. Over a 0.035 inch Roadrunner guidewire, using biplane roadmap technique and constant fluoroscopic guidance, the FlowGate guide catheter was then advanced to the mid cervical left ICA. The guidewire was removed. Again good aspiration was obtained from the hub of the 8 [place] FlowGate guide catheter. A gentle control arteriogram performed centered extracranially and intracranially demonstrated no change in the intracranial circulation. Over a 0.014 inch Softip Synchro micro guidewire, a Trevo ProVue 021 microcatheter was advanced to the distal end of the of FlowGate guide catheter. With the micro guidewire leading with a J-tip configuration, the combination was navigated to the supraclinoid left ICA. The micro guidewire was then advanced gently into the inferior division origin followed by the microcatheter. A gentle manipulation of the micro guidewire was then performed using a torque device to encourage advancement through the occluded left middle cerebral artery. The microcatheter was then brought into close proximity to the site of the occlusion. Further manipulation was performed with partial advancement of the micro guidewire through the occluded left middle cerebral artery inferior division. To avoid potential  perforation of the more distal inferior division the micro guidewire was retrieved. Good aspiration obtained from the hub of the microcatheter. Approximately 3.6 mg of super selective intracranial intra-arterial Integrilin was infused over a couple of minutes. Thereafter, further attempts were made to encourage advancement of a very soft microguidewire again with only partial success. 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 The procedure was then stopped. The final control arteriogram performed through the [facility] guide catheter in the left internal carotid artery continued to demonstrate no significant change in the intracranial circulation. No angiographic evidence of extravasation or mass-effect or midline shift was seen. A Dyna CT of the head performed revealed no evidence of hemorrhage or change in the ventricular size or mass effect or midline shift. The [facility] guide catheter and the neurovascular sheath were then exchanged over a J-tip 0.035 guidewire for an 8 [place] Pinnacle sheath. This was then removed with successful hemostasis being achieved by manual compression. At the end of the procedure, the patient's right groin appeared soft without evidence of bleeding or hematoma. Distal pulses continued to be palpable in the dorsalis pedis, and the posterior tibial regions unchanged from prior to the procedure. The patient's general anesthesia was then reversed. The patient was extubated without difficulty. Upon recovery the patient was noted to be able to raise his right arm, and also bend his right knee. He responded appropriately to simple commands. He  was then transferred to the PACU and then to neuro ICU for further management. IMPRESSION: Status post attempted revascularization of occluded dominant inferior division of the right middle cerebral artery using 3.6 mg of super selective intracranial intra-arterial Integrilin and mechanical thrombolysis with a micro guidewire. PLAN:  Further management as per stroke team. Electronically Signed   By: Julieanne Cotton M.D.   On: 07/06/2017 10:05    ECG & Cardiac Imaging    EKG:  The EKG was personally reviewed and demonstrates 4/24 ST with PVCs, 4/25 SB with PVCs  Echo: 07/06/17  Study Conclusions  - Left ventricle: Wall thickness was increased in a pattern of mild   LVH. Systolic function was mildly to moderately reduced. The   estimated ejection fraction was in the range of 40% to 45%.   Akinesis of the apicalanteroseptal, anterolateral, and apical   myocardium. Doppler parameters are consistent with abnormal left   ventricular relaxation (grade 1 diastolic dysfunction). - Aortic valve: There was mild regurgitation.  Cath: 03/05/17  Conclusion     Ost 1st Mrg to 1st Mrg lesion is 80% stenosed.  1st Mrg lesion is 95% stenosed.  Post Atrio lesion is 100% stenosed.  Prox LAD lesion is 100% stenosed.  Mid LAD lesion is 25% stenosed.  Dist LAD lesion is 95% stenosed.  There is moderate left ventricular systolic dysfunction.  LV end diastolic pressure is moderately elevated.  The left ventricular ejection fraction is 35-45% by visual estimate.     IMPRESSION: Mr. Ringel has a patent graft to the PLA branch, pitting graft to 8 circumflex marginal branch and patent LIMA to the LAD with a patent stent beyond LIMA insertion. He has severe apical LAD disease as well. There is no vessel responsible for his non-STEMI although he does have an anteroapical wall motion and LV. His apical LAD is not revascularizable because of the diffuse disease nature of the disease in size of the vessel. I recommend continued medical therapy. The sheath was removed and pressure will be held on the groin to achieve hemostasis.  Nanetta Batty. MD, [facility]  Assessment & Plan    57 yo male with PMH of CAD s/p CABG ('13), ESRD on HD, HTN, Stroke, HLD, GERD and anemia who presented as Code Stroke. Underwent IA intergrilin and  mechanical thrombolysis. Developed elevated HR and now with rising Trop.   1. NSTEMI: Trop 0.67>>11.47. EKG last evening showed ST with PVCs, with repeat this morning showing SB with PVCs. Unable to assess symptoms as he is globally aphasic. HR and BP have stabilized this morning, but review of telemetry showed several ling runs of VT last evening. Echo  Today LVEF 40 to 45%  Apical akinesis Filling defect susp for thrombus   Would recomm NOAC when feasible from neuro standpoint  2. Tachycardiac with runs of VT: Rhythm has stabilized some   Still frequent PVCs. Has been on BB therapy,   Follow BP, esp with dialysis. -- if recurrent VT consider amiodarone   3. L MCA stroke:  failed thrombectomy with IR.  -- on ASA, plavix and statin  4. ERSD on HD: Followed by nephro. Planned for HD today.  5. HTN: blood pressures are stable this mornng    6. Chronic combined HF: Volume is OK on exam Signed, Laverda Page, NP-C Pager [phone] 07/09/2017, 10:01 AM   Pt seen and examined  I agree with findings as noted above by L Roberts    Pt is a 57 yo with known  CAD (s/p CABG in 2013), ESRD, HTN, CVA, HL  PResented on 4/21 with R sided weakness  Not clear when it occurred  Failed mechanical thrombectom y  Given Integrelin.     Underwent HD    Last night developped severe HTN and elevated HR.  CLutched  Head.  CT neg  Trop spiked to 11.47    Tele with bradycardia and long runs of VT    PT minimally responsive   On exam:  Neck:  JVP normal   Lungs are rel clear.  Cardiac exam:   RRR  No S3  No signif murmurs   Ext without edema   Feet warm   2=+ pulses Neuro Deferred  I have reviewed echo   LVEF 40 to 45%   LV apex is akinetic  IT is susp for thrombus     Would recommend, once OK from neurologic standpoin,  place on NOAC   For NSTEMI continue supportive care including b blockade, statin Follow BP    Will continue to follow.  Dietrich Pates

## 2017-07-09 NOTE — Progress Notes (Signed)
Assessment/Plan: 1. 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 Corliss Skains. CTA showed diffuse intracranial stenosis. Post intervention MRI and MRA completed today. Per neuro/IR. 2. ESRD - HDThursday with limited UF to maintain hemodynamics 3. Acute hypertension and tachycardia yesterday, ?? Rebound ofBP meds  Subjective: Interval History: Spike in HR and BP yest  Objective: Vital signs in last 24 hours: Temp:  [98 F (36.7 C)-98.5 F (36.9 C)] 98 F (36.7 C) (04/25 0800) Pulse Rate:  [28-121] 31 (04/25 0900) Resp:  [12-26] 18 (04/25 1200) BP: (87-168)/(55-112) 167/107 (04/25 1200) SpO2:  [94 %-100 %] 100 % (04/25 0900) Weight change:   Intake/Output from previous day: 04/24 0701 - 04/25 0700 In: 707 [P.O.:440; I.V.:30; NG/GT:237] Out: -  Intake/Output this shift: Total I/O In: 1.2 [I.V.:1.2] Out: -   General appearance: resting Extremities: extremities normal, atraumatic, no cyanosis or edema R hemiplegia  Lab Results: Recent Labs    07/08/17 2100 07/09/17 0447  WBC 7.5 9.3  HGB 13.1 13.0  HCT 38.9* 39.6  PLT 229 169   BMET:  Recent Labs    07/08/17 2100 07/09/17 0750  NA 136 137  K 3.4* 4.0  CL 95* 98*  CO2 25 21*  GLUCOSE 147* 95  BUN 23* 27*  CREATININE 11.20* 12.24*  CALCIUM 10.4* 10.5*   No results for input(s): PTH in the last 72 hours. Iron Studies: No results for input(s): IRON, TIBC, TRANSFERRIN, FERRITIN in the last 72 hours. Studies/Results: Ct Head Wo Contrast  Result Date: 07/08/2017 CLINICAL DATA:  57 y/o M; stroke with sudden increase in blood pressure. EXAM: CT HEAD WITHOUT CONTRAST TECHNIQUE: Contiguous axial images were obtained from the base of the skull through the vertex without intravenous contrast. COMPARISON:  07/06/2017 CT head. FINDINGS: Brain: Small area of loss of gray-white differentiation within the left parietal lobe is similar in  distribution comparison with area of infarction on MRI and is compatible with evolving late acute to early subacute infarction. No new area of acute infarction, hemorrhage, or focal mass effect identified. Stable small chronic infarction in the right posterior temporal lobe, left hemi pons, left inferior cerebellum, and lacunar infarctions within bilateral anterior basal ganglia. Stable background of chronic microvascular ischemic changes and parenchymal volume loss of the brain. Vascular: Persistent contrast within the venous and arterial systems. Skull: Normal. Negative for fracture or focal lesion. Sinuses/Orbits: No acute finding. Other: None. IMPRESSION: 1. Left parietal region of acute infarction is stable in comparison with prior MRI given differences in technique. No new acute intracranial abnormality identified. 2. Stable background of chronic infarctions, microvascular ischemic changes, parenchymal volume loss of the brain. Electronically Signed   By: Mitzi Hansen M.D.   On: 07/08/2017 20:03    Scheduled: .  stroke: mapping our early stages of recovery book   Does not apply Once  . aspirin EC  325 mg Oral Daily  . atorvastatin  80 mg Oral Daily  . Chlorhexidine Gluconate Cloth  6 each Topical Q0600  . clopidogrel  75 mg Oral Daily  . doxercalciferol  2 mcg Intravenous Q M,W,F-HD  . febuxostat  40 mg Oral Daily  . feeding supplement (NEPRO CARB STEADY)  237 mL Oral BID BM  . mouth rinse  15 mL Mouth Rinse BID  . memantine  10 mg Oral BID  . metoprolol tartrate  12.5 mg Oral BID  . mupirocin ointment  1 application Nasal BID  .  nitroGLYCERIN  1 inch Topical Q6H     LOS: 4 days   Lauris Poag 07/09/2017,1:06 PM

## 2017-07-09 NOTE — Progress Notes (Signed)
Dialysis treatment completed.  1500 mL ultrafiltrated and net fluid removal 1000 mL.    Patient status unchanged. Lung sounds diminished to ausculation in all fields. No edema. Cardiac: NSR.  Disconnected lines and removed needles.  Pressure held for 10 minutes and band aid/gauze dressing applied.  Report given to bedside RN, [place].

## 2017-07-09 NOTE — Progress Notes (Signed)
OVERNIGHT COVERAGE PROGRESS NOTE  Review of vital sign flowsheet reveals hypotension and bradycardia.  Most notably, the patient becomes profoundly bradycardic during sleep.  Heart rate does promptly improve upon awakening.  Restarted clonidine and metoprolol earlier this evening.  Will continue clonidine at this time.  Hold next dose of metoprolol and reduce subsequent doses to 12.5 mg p.o. twice daily.  Plan discussed with nurse.  Marcelle Smiling, MD

## 2017-07-09 NOTE — Progress Notes (Signed)
  Echocardiogram 2D Echocardiogram with definity has been performed.  Leta Jungling M 07/09/2017, 11:25 AM

## 2017-07-09 NOTE — Progress Notes (Addendum)
ANTICOAGULATION CONSULT NOTE  Pharmacy Consult for heparin Indication: chest pain/ACS  Heparin Dosing Weight: 86.8 kg  Labs: Recent Labs    07/08/17 0406 07/08/17 2100 07/09/17 0447 07/09/17 0750 07/09/17 1650  HGB 13.2 13.1 13.0  --   --   HCT 39.7 38.9* 39.6  --   --   PLT 220 229 169  --   --   HEPARINUNFRC  --   --   --   --  0.51  CREATININE 9.17* 11.20*  --  12.24*  --   TROPONINI  --  0.62*  --  11.47*  --     Assessment: 1 yom presenting with CVA (did not receive TPA, s/p mechanical thrombectomy) with subsequent tachyarrhythmias and elevation in troponin. Pharmacy consulted to dose heparin for ACS. CCM spoke with Neuro and no contraindication to anticoagulation. On asa/plavix PTA but not on anticoagulation. CBC wnl. No bleed documented. Previously on Lovenox ppx dosing - last dose given last night.  Heparin level 0.51  Goal of Therapy:  Heparin level 0.3-0.5 units/ml, no boluses Monitor platelets by anticoagulation protocol: Yes   Plan:   Decrease heparin to 1100 units/h Heparin level with AM labs Daily heparin level/CBC Monitor s/sx bleeding  Quashon Jesus A. Jeanella Craze, PharmD, BCPS Clinical Pharmacist [facility] Pager: [phone]  07/09/2017 5:51 PM

## 2017-07-09 NOTE — Progress Notes (Addendum)
STROKE TEAM PROGRESS NOTE   SUBJECTIVE (INTERVAL HISTORY) His RN is at the bedside. Pt overnight had event with head clenching, hypertension and tachycardia. Tele also showed 30sec or so Vtach. Had metoprolol IV injection for BP and HR. Repeat CT stable. This am his neuro stable but seems in bradycardia with PACs bigeminy and his Troponin up from 0.62 to 11.47. Cardiology consulted.    OBJECTIVE Temp:  [97.9 F (36.6 C)-98.5 F (36.9 C)] 98 F (36.7 C) (04/25 0800) Pulse Rate:  [28-121] 31 (04/25 0900) Cardiac Rhythm: Sinus bradycardia (04/25 0400) Resp:  [12-26] 19 (04/25 0900) BP: (87-168)/(55-112) 165/85 (04/25 0900) SpO2:  [94 %-100 %] 100 % (04/25 0900)  Recent Labs  Lab 07/06/17 0158 07/08/17 1149 07/08/17 1708 07/08/17 2153 07/09/17 0901  GLUCAP 97 102* 110* 149* 92   Recent Labs  Lab 07/06/17 0510 07/07/17 0804 07/08/17 0406 07/08/17 2100 07/09/17 0750  NA 133* 135 135 136 137  K 3.5 3.4* 3.7 3.4* 4.0  CL 101 100* 96* 95* 98*  CO2 18* 21* 21* 25 21*  GLUCOSE 106* 89 86 147* 95  BUN 35* 26* 16 23* 27*  CREATININE 13.49* 10.40* 9.17* 11.20* 12.24*  CALCIUM 9.6 9.2 10.1 10.4* 10.5*  MG  --   --   --  2.1  --   PHOS  --  4.5  --   --   --    Recent Labs  Lab 07/05/17 2215 07/07/17 0804 07/08/17 2100  AST 15  --  21  ALT 9*  --  <5*  ALKPHOS 40  --  49  BILITOT 1.0  --  0.9  PROT 7.4  --  7.0  ALBUMIN 3.9 3.3* 3.6   Recent Labs  Lab 07/05/17 2215  07/06/17 0510 07/07/17 0806 07/08/17 0406 07/08/17 2100 07/09/17 0447  WBC 14.8*  --  12.0* 7.5 8.5 7.5 9.3  NEUTROABS 12.7*  --  9.0*  --   --  5.7  --   HGB 13.2   < > 11.4* 11.7* 13.2 13.1 13.0  HCT 38.9*   < > 32.9* 34.9* 39.7 38.9* 39.6  MCV 92.4  --  91.1 90.9 93.0 93.1 93.6  PLT 191  --  197 187 220 229 169   < > = values in this interval not displayed.   Recent Labs  Lab 07/08/17 2100 07/09/17 0750  TROPONINI 0.62* 11.47*   No results for input(s): LABPROT, INR in the last 72  hours. No results for input(s): COLORURINE, LABSPEC, PHURINE, GLUCOSEU, HGBUR, BILIRUBINUR, KETONESUR, PROTEINUR, UROBILINOGEN, NITRITE, LEUKOCYTESUR in the last 72 hours.  Invalid input(s): APPERANCEUR     Component Value Date/Time   CHOL 180 07/06/2017 0510   CHOL 208 (H) 03/19/2017 1152   TRIG 98 07/06/2017 0510   HDL 29 (L) 07/06/2017 0510   HDL 40 03/19/2017 1152   CHOLHDL 6.2 07/06/2017 0510   VLDL 20 07/06/2017 0510   LDLCALC 131 (H) 07/06/2017 0510   LDLCALC 139 (H) 03/19/2017 1152   Lab Results  Component Value Date   HGBA1C 4.6 (L) 07/06/2017      Component Value Date/Time   LABOPIA NONE DETECTED 07/07/2017 1752   COCAINSCRNUR NONE DETECTED 07/07/2017 1752   LABBENZ NONE DETECTED 07/07/2017 1752   AMPHETMU NONE DETECTED 07/07/2017 1752   THCU NONE DETECTED 07/07/2017 1752   LABBARB NONE DETECTED 07/07/2017 1752    No results for input(s): ETH in the last 168 hours.  I have personally reviewed the radiological images below  and agree with the radiology interpretations.  Ct Angio Head W Or Wo Contrast  Result Date: 07/05/2017 CLINICAL DATA:  Right hemiparesis and difficulty speaking. EXAM: CT ANGIOGRAPHY HEAD AND NECK CT PERFUSION BRAIN TECHNIQUE: Multidetector CT imaging of the head and neck was performed using the standard protocol during bolus administration of intravenous contrast. Multiplanar CT image reconstructions and MIPs were obtained to evaluate the vascular anatomy. Carotid stenosis measurements (when applicable) are obtained utilizing NASCET criteria, using the distal internal carotid diameter as the denominator. Multiphase CT imaging of the brain was performed following IV bolus contrast injection. Subsequent parametric perfusion maps were calculated using RAPID software. CONTRAST:  ISOVUE-370 IOPAMIDOL (ISOVUE-370) INJECTION 76% COMPARISON:  Head MRA 12/29/2013.  Neck MRA 06/30/2005 FINDINGS: CTA NECK FINDINGS Aortic arch: Standard 3 vessel aortic arch.  Widely patent arch vessel origins. Right carotid system: Patent with predominantly soft plaque in the common carotid artery resulting in less than 50% narrowing. Minimal calcified plaque at the carotid bifurcation. No ICA stenosis. Left carotid system: Patent with predominantly soft plaque in the distal common carotid artery resulting in 50% stenosis. Moderate calcified plaque about the carotid bifurcation. No ICA stenosis. Vertebral arteries: Patent with the left being slightly dominant. Mild proximal right V2 stenosis due to calcified plaque. Calcified and soft plaque in the left V1 segment with mild multifocal stenoses. Skeleton: Mild cervical disc degeneration. Other neck: Multiple thyroid nodules including a 2.9 cm exophytic. Nodule extending inferiorly from the isthmus. Upper chest: Clear lung apices. Review of the MIP images confirms the above findings CTA HEAD FINDINGS Anterior circulation: The internal carotid arteries are patent from skull base to carotid termini with calcified plaque resulting in mild cavernous and supraclinoid stenosis on the right. The M1 segments are patent with mild stenosis on the right. The left M2 inferior division is occluded at its origin with some reconstitution of more distal M2 and M3 branches, however there are numerous missing distal branches in the left parietal region corresponding to the acute infarct. The prior MRA also suggested a severe stenosis or possibly short segment occlusion of the left M2 inferior division, however motion artifact on that study makes it difficult to make a detailed assessment of interval changes. There are also likely some missing distal left MCA superior division branch vessels. There also severe proximal right M2 stenoses which are at least partially chronic. The right A1 segment is occluded. The left A1 segment is patent but demonstrates a severe proximal stenosis. Both A2 segments are heavily diseased with multifocal severe stenoses and or  short segment occlusions. The appearance of the ACAs is grossly similar to the prior motion degraded MRA. No aneurysm. Posterior circulation: The intracranial vertebral arteries are patent to the basilar. There is right greater than left V4 segment calcified plaque without significant stenosis. Patent SCA origins are identified bilaterally. PICA and AICA origins are not well evaluated. The basilar artery is widely patent. Posterior communicating arteries are diminutive or absent. PCAs are patent with mild left P2 stenosis. No aneurysm. Venous sinuses: Inadequately assessed due to arterial phase contrast timing. Anatomic variants: None. Delayed phase: Not performed. Review of the MIP images confirms the above findings CT Brain Perfusion Findings: CBF (<30%) Volume: 28mL Perfusion (Tmax>6.0s) volume: , however a substantial amount of this is located in the right cerebral hemisphere Mismatch Volume: though with the caveat that this is artifactually inflated due to (likely chronic) prolonged perfusion in the right cerebral hemisphere. There is still however an apparent sizable penumbra  in the posterior left cerebral hemisphere. Infarction Location:Predominantly left parietal lobe with a small amount of posterior left frontal lobe, posterior left temporal lobe, and posterior insula involvement. IMPRESSION: 1. 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 2. Left M2 inferior division occlusion at its origin with some distal reconstitution. Severe stenosis (or possibly short segment occlusion) in this location on a prior MRA. Numerous missing distal branch vessels in the left parietal region corresponding to the acute infarct. 3. Advanced intracranial atherosclerosis with numerous severe stenoses as above. 4. 50% left common carotid artery stenosis. 5.  Aortic Atherosclerosis (ICD10-I70.0). 6. Thyroid nodules measuring up to 2.9 cm. Nonemergent thyroid ultrasound recommended for further evaluation.  These results were communicated to Dr. Amada Jupiter at 10:18 pm on 07/05/2017 by text page via the [facility] messaging system. Electronically Signed   By: Sebastian Ache M.D.   On: 07/05/2017 22:47   Ct Head Wo Contrast  Result Date: 07/08/2017 CLINICAL DATA:  57 y/o M; stroke with sudden increase in blood pressure. EXAM: CT HEAD WITHOUT CONTRAST TECHNIQUE: Contiguous axial images were obtained from the base of the skull through the vertex without intravenous contrast. COMPARISON:  07/06/2017 CT head. FINDINGS: Brain: Small area of loss of gray-white differentiation within the left parietal lobe is similar in distribution comparison with area of infarction on MRI and is compatible with evolving late acute to early subacute infarction. No new area of acute infarction, hemorrhage, or focal mass effect identified. Stable small chronic infarction in the right posterior temporal lobe, left hemi pons, left inferior cerebellum, and lacunar infarctions within bilateral anterior basal ganglia. Stable background of chronic microvascular ischemic changes and parenchymal volume loss of the brain. Vascular: Persistent contrast within the venous and arterial systems. Skull: Normal. Negative for fracture or focal lesion. Sinuses/Orbits: No acute finding. Other: None. IMPRESSION: 1. Left parietal region of acute infarction is stable in comparison with prior MRI given differences in technique. No new acute intracranial abnormality identified. 2. Stable background of chronic infarctions, microvascular ischemic changes, parenchymal volume loss of the brain. Electronically Signed   By: Mitzi Hansen M.D.   On: 07/08/2017 20:03   Ct Head Wo Contrast  Result Date: 07/06/2017 CLINICAL DATA:  Stroke follow-up EXAM: CT HEAD WITHOUT CONTRAST TECHNIQUE: Contiguous axial images were obtained from the base of the skull through the vertex without intravenous contrast. COMPARISON:  Brain MRI 07/06/2017 Head CT 07/06/2017 at 5:35 a.m.  FINDINGS: Brain: Decreased density of material of the left basal ganglia, consistent with clearing of contrast staining. Right temporal encephalomalacia is unchanged. No acute hemorrhage or mass effect. No hydrocephalus. Vascular: Small amount of residual contrast material within the vessels. Skull: Normal visualized skull base, calvarium and extracranial soft tissues. Sinuses/Orbits: No sinus fluid levels or advanced mucosal thickening. No mastoid effusion. Normal orbits. IMPRESSION: Decreased density of hyperdense material at the left basal ganglia, consistent with clearing of contrast staining. No new abnormality. Electronically Signed   By: Deatra Robinson M.D.   On: 07/06/2017 23:24   Ct Head Wo Contrast  Result Date: 07/06/2017 CLINICAL DATA:  Left MCA infarct status post intervention EXAM: CT HEAD WITHOUT CONTRAST TECHNIQUE: Contiguous axial images were obtained from the base of the skull through the vertex without intravenous contrast. COMPARISON:  Cerebral angiogram 07/05/2017 CTA head neck 07/05/2017 FINDINGS: Brain: There is hyperdensity within the left basal ganglia, likely indicating contrast staining. No midline shift or mass effect. No hydrocephalus. Unchanged right temporal lobe encephalomalacia. Vascular: There is intravascular contrast material related to  prior an angiographic study. Skull: Normal visualized skull base, calvarium and extracranial soft tissues. Sinuses/Orbits: No sinus fluid levels or advanced mucosal thickening. No mastoid effusion. Normal orbits. IMPRESSION: Hyperdensity of the left basal ganglia is favored to represent contrast staining over petechial hemorrhage. Electronically Signed   By: Deatra Robinson M.D.   On: 07/06/2017 05:56   Ct Angio Neck W Or Wo Contrast  Result Date: 07/05/2017 CLINICAL DATA:  Right hemiparesis and difficulty speaking. EXAM: CT ANGIOGRAPHY HEAD AND NECK CT PERFUSION BRAIN TECHNIQUE: Multidetector CT imaging of the head and neck was performed  using the standard protocol during bolus administration of intravenous contrast. Multiplanar CT image reconstructions and MIPs were obtained to evaluate the vascular anatomy. Carotid stenosis measurements (when applicable) are obtained utilizing NASCET criteria, using the distal internal carotid diameter as the denominator. Multiphase CT imaging of the brain was performed following IV bolus contrast injection. Subsequent parametric perfusion maps were calculated using RAPID software. CONTRAST:  ISOVUE-370 IOPAMIDOL (ISOVUE-370) INJECTION 76% COMPARISON:  Head MRA 12/29/2013.  Neck MRA 06/30/2005 FINDINGS: CTA NECK FINDINGS Aortic arch: Standard 3 vessel aortic arch. Widely patent arch vessel origins. Right carotid system: Patent with predominantly soft plaque in the common carotid artery resulting in less than 50% narrowing. Minimal calcified plaque at the carotid bifurcation. No ICA stenosis. Left carotid system: Patent with predominantly soft plaque in the distal common carotid artery resulting in 50% stenosis. Moderate calcified plaque about the carotid bifurcation. No ICA stenosis. Vertebral arteries: Patent with the left being slightly dominant. Mild proximal right V2 stenosis due to calcified plaque. Calcified and soft plaque in the left V1 segment with mild multifocal stenoses. Skeleton: Mild cervical disc degeneration. Other neck: Multiple thyroid nodules including a 2.9 cm exophytic. Nodule extending inferiorly from the isthmus. Upper chest: Clear lung apices. Review of the MIP images confirms the above findings CTA HEAD FINDINGS Anterior circulation: The internal carotid arteries are patent from skull base to carotid termini with calcified plaque resulting in mild cavernous and supraclinoid stenosis on the right. The M1 segments are patent with mild stenosis on the right. The left M2 inferior division is occluded at its origin with some reconstitution of more distal M2 and M3 branches, however there  are numerous missing distal branches in the left parietal region corresponding to the acute infarct. The prior MRA also suggested a severe stenosis or possibly short segment occlusion of the left M2 inferior division, however motion artifact on that study makes it difficult to make a detailed assessment of interval changes. There are also likely some missing distal left MCA superior division branch vessels. There also severe proximal right M2 stenoses which are at least partially chronic. The right A1 segment is occluded. The left A1 segment is patent but demonstrates a severe proximal stenosis. Both A2 segments are heavily diseased with multifocal severe stenoses and or short segment occlusions. The appearance of the ACAs is grossly similar to the prior motion degraded MRA. No aneurysm. Posterior circulation: The intracranial vertebral arteries are patent to the basilar. There is right greater than left V4 segment calcified plaque without significant stenosis. Patent SCA origins are identified bilaterally. PICA and AICA origins are not well evaluated. The basilar artery is widely patent. Posterior communicating arteries are diminutive or absent. PCAs are patent with mild left P2 stenosis. No aneurysm. Venous sinuses: Inadequately assessed due to arterial phase contrast timing. Anatomic variants: None. Delayed phase: Not performed. Review of the MIP images confirms the above findings CT Brain Perfusion  Findings: CBF (<30%) Volume: 28mL Perfusion (Tmax>6.0s) volume: , however a substantial amount of this is located in the right cerebral hemisphere Mismatch Volume: though with the caveat that this is artifactually inflated due to (likely chronic) prolonged perfusion in the right cerebral hemisphere. There is still however an apparent sizable penumbra in the posterior left cerebral hemisphere. Infarction Location:Predominantly left parietal lobe with a small amount of posterior left frontal lobe, posterior  left temporal lobe, and posterior insula involvement. IMPRESSION: 1. 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 2. Left M2 inferior division occlusion at its origin with some distal reconstitution. Severe stenosis (or possibly short segment occlusion) in this location on a prior MRA. Numerous missing distal branch vessels in the left parietal region corresponding to the acute infarct. 3. Advanced intracranial atherosclerosis with numerous severe stenoses as above. 4. 50% left common carotid artery stenosis. 5.  Aortic Atherosclerosis (ICD10-I70.0). 6. Thyroid nodules measuring up to 2.9 cm. Nonemergent thyroid ultrasound recommended for further evaluation. These results were communicated to Dr. Amada Jupiter at 10:18 pm on 07/05/2017 by text page via the [facility] messaging system. Electronically Signed   By: Sebastian Ache M.D.   On: 07/05/2017 22:47   Mr Maxine Glenn Head Wo Contrast  Result Date: 07/06/2017 CLINICAL DATA:  Stroke follow-up EXAM: MRI HEAD WITHOUT CONTRAST MRA HEAD WITHOUT CONTRAST TECHNIQUE: Multiplanar, multiecho pulse sequences of the brain and surrounding structures were obtained without intravenous contrast. Angiographic images of the head were obtained using MRA technique without contrast. COMPARISON:  CT, CTA, and CTP 07/05/2017.  Brain MRI 04/27/2016 FINDINGS: MRI HEAD FINDINGS Brain: Cortically based restricted diffusion in the posterior left insula, superior temporal lobe, parietal lobe, and minimally involving the posterior left frontal lobe. There is good correlation with infarct by CT perfusion. Small remote infarcts in the left superior cerebellum, left paramedian pons, left basal ganglia/internal capsule, and right temporal parietal cortex. There is chronic blood products at the remote left basal ganglia infarct. No hemorrhage seen at the acute infarct. No hydrocephalus or masslike finding. Vascular: Arterial findings below. Normal dural venous sinus flow voids. Skull and upper  cervical spine: Negative Sinuses/Orbits: Negative MRA HEAD FINDINGS Symmetric carotid and vertebral arteries. The vertebral and basilar arteries are smooth and diffusely patent. Atheromatous irregularity of bilateral posterior cerebral arteries with high-grade left P2 segment narrowing. Essentially nonvisualized bilateral ACA attributed to advanced atheromatous narrowing based on prior CTA. Less robust flow signal seen in the left MCA with continued nonvisualization of the inferior division. The left superior division and right MCA vasculature show diffuse atheromatous irregularity. IMPRESSION: 1. Acute left MCA territory cortical infarct without detected progression when compared to CTP from yesterday. No hemorrhagic conversion. 2. Continued non visualization of the left MCA inferior division. 3. There is advanced intracranial atherosclerosis with nonvisualized ACAs and severe left P2 segment stenosis. 4. Remote small vessel infarcts as described. Electronically Signed   By: Marnee Spring M.D.   On: 07/06/2017 12:59   Mr Brain Wo Contrast  Result Date: 07/06/2017 CLINICAL DATA:  Stroke follow-up EXAM: MRI HEAD WITHOUT CONTRAST MRA HEAD WITHOUT CONTRAST TECHNIQUE: Multiplanar, multiecho pulse sequences of the brain and surrounding structures were obtained without intravenous contrast. Angiographic images of the head were obtained using MRA technique without contrast. COMPARISON:  CT, CTA, and CTP 07/05/2017.  Brain MRI 04/27/2016 FINDINGS: MRI HEAD FINDINGS Brain: Cortically based restricted diffusion in the posterior left insula, superior temporal lobe, parietal lobe, and minimally involving the posterior left frontal lobe. There is good correlation  with infarct by CT perfusion. Small remote infarcts in the left superior cerebellum, left paramedian pons, left basal ganglia/internal capsule, and right temporal parietal cortex. There is chronic blood products at the remote left basal ganglia infarct. No  hemorrhage seen at the acute infarct. No hydrocephalus or masslike finding. Vascular: Arterial findings below. Normal dural venous sinus flow voids. Skull and upper cervical spine: Negative Sinuses/Orbits: Negative MRA HEAD FINDINGS Symmetric carotid and vertebral arteries. The vertebral and basilar arteries are smooth and diffusely patent. Atheromatous irregularity of bilateral posterior cerebral arteries with high-grade left P2 segment narrowing. Essentially nonvisualized bilateral ACA attributed to advanced atheromatous narrowing based on prior CTA. Less robust flow signal seen in the left MCA with continued nonvisualization of the inferior division. The left superior division and right MCA vasculature show diffuse atheromatous irregularity. IMPRESSION: 1. Acute left MCA territory cortical infarct without detected progression when compared to CTP from yesterday. No hemorrhagic conversion. 2. Continued non visualization of the left MCA inferior division. 3. There is advanced intracranial atherosclerosis with nonvisualized ACAs and severe left P2 segment stenosis. 4. Remote small vessel infarcts as described. Electronically Signed   By: Marnee Spring M.D.   On: 07/06/2017 12:59   Ir Ct Head Ltd  Result Date: 07/07/2017 INDICATION: Flaccid right and arm leg. Left gaze deviation. Dysarthria. Occluded inferior division of the left middle cerebral artery. EXAM: 1. EMERGENT LARGE VESSEL OCCLUSION THROMBOLYSIS (anterior CIRCULATION) COMPARISON:  CT angiogram of the head and neck of 07/05/2017. MEDICATIONS: Ancef 2 g IV antibiotic was administered within 1 hour of the procedure. ANESTHESIA/SEDATION: General anesthesia. CONTRAST:  Isovue 300 approximately 70 cc. FLUOROSCOPY TIME:  Fluoroscopy Time: 27 minutes 12 seconds (2951 mGy). COMPLICATIONS: None immediate. TECHNIQUE: Following a full explanation of the procedure along with the potential associated complications, an informed witnessed consent was obtained. The  risks of intracranial hemorrhage of 10%, worsening neurological deficit, ventilator dependency, death and inability to revascularize were all reviewed in detail with the patient's family. The patient was then put under general anesthesia by the Department of Anesthesiology at [facility]. The right groin was prepped and draped in the usual sterile fashion. Thereafter using modified Seldinger technique, transfemoral access into the right common femoral artery was obtained without difficulty. Over a 0.035 inch guidewire a 5 French Pinnacle sheath was inserted. Through this, and also over a 0.035 inch guidewire a 5 [place] JB 1 catheter was advanced to the aortic arch region and selectively positioned in the right common carotid artery, the right vertebral artery, the left vertebral artery and the left common carotid artery. FINDINGS: The left common carotid arteriogram demonstrates approximately 50% stenosis of the mid segment of the left common carotid artery. The left external carotid artery and its major branches are widely patent. The left internal carotid artery at the bulb demonstrates wide patency. More distally, there is mild decreased caliber of the left internal carotid artery in its entirety. The petrous, cavernous and supraclinoid segments demonstrate wide patency. The left anterior cerebral artery demonstrates complete occlusion distal to the left A1 segment. This is associated with caliber irregularity in the A1 segment. The left middle cerebral artery M1 segment proximally demonstrates mild caliber irregularity. There is patency of the superior division of the left middle cerebral artery with delayed reconstitution of the distal inferior division of the left middle cerebral artery in the M2 M3 regions. Distal to this the M3 branches and the M4 branches demonstrated gradual ascent of contrast. The inferior division appears to be  completely occluded associated with a stump. A diminutive anterior  temporal branch is noted. A prominent anterior choroidal artery on the left is noted with delayed arterial blush noted in the region of the splenium of the corpus callosum. The whole head delayed arterial phase in the lateral projection demonstrates a prominent area of hypoperfusion involving the left parietal cortical and subcortical regions. The left vertebral artery origin is from the thyrocervical trunk. The vessel is seen to opacify to the cranial skull base. Wide patency is seen of the left vertebrobasilar junction and the left posterior inferior cerebellar artery. The basilar artery, the posterior cerebral arteries, the superior cerebellar arteries and the anterior-inferior cerebellar arteries opacify into the capillary and venous phases. Prominent collaterals are seen in the P3 region of the left posterior cerebral artery retrogradely filling the posterior 1/3 of the left parietal cortical and subcortical areas. The delayed arterial phase then demonstrates retrograde opacification of the distal pericallosal artery in the splenium of the corpus callosum from the lateral thalamo perforator branches. Also demonstrated is retrograde opacification of the posterior half of the anterior temporal region from the posterior temporal branches of the P1 P2 region of the left posterior cerebral artery. The right common carotid arteriogram demonstrates mild narrowing in the mid third of the right common carotid artery. The right external carotid artery and its major branches are widely patent. The right internal carotid artery at the bulb to the cranial skull base is seen to opacify unimpeded. The petrous segment is normal. There is mild narrowing of the caval cavernous segment of the right internal carotid artery. Distal to this the supraclinoid segment demonstrates mild decrease in caliber. The right middle cerebral artery demonstrates a 50% stenosis in the distal right M1 segment. The proximal superior and the inferior  divisions demonstrate moderate stenosis. Again demonstrated is a prominent anterior choroidal artery on the right side. The delayed arterial phase demonstrates hypoperfusion of the parietal cortical and subcortical regions. The right vertebral artery origin is from the right thyrocervical trunk. Again this vessel is seen to opacify to the cranial skull base. Wide patency is seen of the right vertebrobasilar junction and the right posterior-inferior cerebellar artery. Multiple focal areas of caliber irregularity are seen involving the posterior-inferior cerebellar artery. The opacified portion of the basilar artery, the proximal posterior cerebral arteries, the superior cerebellar arteries and the anterior-inferior cerebellar arteries are seen to opacify into the capillary and venous phases. Extensive collaterals are again seen from the P3 regions of both posterior cerebral arteries contributing to the posterior parietal middle cerebral artery distributions bilaterally as described earlier. PROCEDURE: ENDOVASCULAR TREATMENT OF OCCLUDED DOMINANT INFERIOR DIVISION OF THE LEFT MIDDLE CEREBRAL ARTERY The diagnostic JB 1 catheter in the left common carotid artery was exchanged over a 0.035 inch 8 French 55 cm Brite tip neurovascular sheath using biplane roadmap technique and constant fluoroscopic guidance. Good aspiration obtained from the hub of the 8 [place] Brite tip neurovascular sheath. A gentle contrast injection demonstrated no evidence of spasms, dissections or of intraluminal filling defects. This was then connected to continuous heparinized saline infusion. Over the [facility] exchange guidewire, an 85 cm 8 [place] of balloon FlowGate guide catheter which had been prepped with 50% contrast and 50% heparinized saline infusion was advanced and positioned just proximal to the left common carotid bifurcation. The guidewire was removed. Good aspiration was obtained at the hub of the [facility] guide catheter. Over a 0.035  inch Roadrunner guidewire, using biplane roadmap technique and constant fluoroscopic guidance,  the [facility] guide catheter was then advanced to the mid cervical left ICA. The guidewire was removed. Again good aspiration was obtained from the hub of the 8 [place] FlowGate guide catheter. A gentle control arteriogram performed centered extracranially and intracranially demonstrated no change in the intracranial circulation. Over a 0.014 inch Softip Synchro micro guidewire, a Trevo ProVue 021 microcatheter was advanced to the distal end of the of FlowGate guide catheter. With the micro guidewire leading with a J-tip configuration, the combination was navigated to the supraclinoid left ICA. The micro guidewire was then advanced gently into the inferior division origin followed by the microcatheter. A gentle manipulation of the micro guidewire was then performed using a torque device to encourage advancement through the occluded left middle cerebral artery. The microcatheter was then brought into close proximity to the site of the occlusion. Further manipulation was performed with partial advancement of the micro guidewire through the occluded left middle cerebral artery inferior division. To avoid potential perforation of the more distal inferior division the micro guidewire was retrieved. Good aspiration obtained from the hub of the microcatheter. Approximately 3.6 mg of super selective intracranial intra-arterial Integrilin was infused over a couple of minutes. Thereafter, further attempts were made to encourage advancement of a very soft microguidewire again with only partial success. 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 The procedure was then stopped. The final control arteriogram performed through the [facility] guide catheter in the left internal carotid artery continued to demonstrate no significant change in the intracranial circulation. No angiographic evidence of extravasation or  mass-effect or midline shift was seen. A Dyna CT of the head performed revealed no evidence of hemorrhage or change in the ventricular size or mass effect or midline shift. The [facility] guide catheter and the neurovascular sheath were then exchanged over a J-tip 0.035 guidewire for an 8 [place] Pinnacle sheath. This was then removed with successful hemostasis being achieved by manual compression. At the end of the procedure, the patient's right groin appeared soft without evidence of bleeding or hematoma. Distal pulses continued to be palpable in the dorsalis pedis, and the posterior tibial regions unchanged from prior to the procedure. The patient's general anesthesia was then reversed. The patient was extubated without difficulty. Upon recovery the patient was noted to be able to raise his right arm, and also bend his right knee. He responded appropriately to simple commands. He was then transferred to the PACU and then to neuro ICU for further management. IMPRESSION: Status post attempted revascularization of occluded dominant inferior division of the right middle cerebral artery using 3.6 mg of super selective intracranial intra-arterial Integrilin and mechanical thrombolysis with a micro guidewire. PLAN: Further management as per stroke team. Electronically Signed   By: Julieanne Cotton M.D.   On: 07/06/2017 10:05   Ct Cerebral Perfusion W Contrast  Result Date: 07/05/2017 CLINICAL DATA:  Right hemiparesis and difficulty speaking. EXAM: CT ANGIOGRAPHY HEAD AND NECK CT PERFUSION BRAIN TECHNIQUE: Multidetector CT imaging of the head and neck was performed using the standard protocol during bolus administration of intravenous contrast. Multiplanar CT image reconstructions and MIPs were obtained to evaluate the vascular anatomy. Carotid stenosis measurements (when applicable) are obtained utilizing NASCET criteria, using the distal internal carotid diameter as the denominator. Multiphase CT imaging of the  brain was performed following IV bolus contrast injection. Subsequent parametric perfusion maps were calculated using RAPID software. CONTRAST:  ISOVUE-370 IOPAMIDOL (ISOVUE-370) INJECTION 76% COMPARISON:  Head MRA 12/29/2013.  Neck  MRA 06/30/2005 FINDINGS: CTA NECK FINDINGS Aortic arch: Standard 3 vessel aortic arch. Widely patent arch vessel origins. Right carotid system: Patent with predominantly soft plaque in the common carotid artery resulting in less than 50% narrowing. Minimal calcified plaque at the carotid bifurcation. No ICA stenosis. Left carotid system: Patent with predominantly soft plaque in the distal common carotid artery resulting in 50% stenosis. Moderate calcified plaque about the carotid bifurcation. No ICA stenosis. Vertebral arteries: Patent with the left being slightly dominant. Mild proximal right V2 stenosis due to calcified plaque. Calcified and soft plaque in the left V1 segment with mild multifocal stenoses. Skeleton: Mild cervical disc degeneration. Other neck: Multiple thyroid nodules including a 2.9 cm exophytic. Nodule extending inferiorly from the isthmus. Upper chest: Clear lung apices. Review of the MIP images confirms the above findings CTA HEAD FINDINGS Anterior circulation: The internal carotid arteries are patent from skull base to carotid termini with calcified plaque resulting in mild cavernous and supraclinoid stenosis on the right. The M1 segments are patent with mild stenosis on the right. The left M2 inferior division is occluded at its origin with some reconstitution of more distal M2 and M3 branches, however there are numerous missing distal branches in the left parietal region corresponding to the acute infarct. The prior MRA also suggested a severe stenosis or possibly short segment occlusion of the left M2 inferior division, however motion artifact on that study makes it difficult to make a detailed assessment of interval changes. There are also likely some  missing distal left MCA superior division branch vessels. There also severe proximal right M2 stenoses which are at least partially chronic. The right A1 segment is occluded. The left A1 segment is patent but demonstrates a severe proximal stenosis. Both A2 segments are heavily diseased with multifocal severe stenoses and or short segment occlusions. The appearance of the ACAs is grossly similar to the prior motion degraded MRA. No aneurysm. Posterior circulation: The intracranial vertebral arteries are patent to the basilar. There is right greater than left V4 segment calcified plaque without significant stenosis. Patent SCA origins are identified bilaterally. PICA and AICA origins are not well evaluated. The basilar artery is widely patent. Posterior communicating arteries are diminutive or absent. PCAs are patent with mild left P2 stenosis. No aneurysm. Venous sinuses: Inadequately assessed due to arterial phase contrast timing. Anatomic variants: None. Delayed phase: Not performed. Review of the MIP images confirms the above findings CT Brain Perfusion Findings: CBF (<30%) Volume: 28mL Perfusion (Tmax>6.0s) volume: , however a substantial amount of this is located in the right cerebral hemisphere Mismatch Volume: though with the caveat that this is artifactually inflated due to (likely chronic) prolonged perfusion in the right cerebral hemisphere. There is still however an apparent sizable penumbra in the posterior left cerebral hemisphere. Infarction Location:Predominantly left parietal lobe with a small amount of posterior left frontal lobe, posterior left temporal lobe, and posterior insula involvement. IMPRESSION: 1. 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 2. Left M2 inferior division occlusion at its origin with some distal reconstitution. Severe stenosis (or possibly short segment occlusion) in this location on a prior MRA. Numerous missing distal branch vessels in the left  parietal region corresponding to the acute infarct. 3. Advanced intracranial atherosclerosis with numerous severe stenoses as above. 4. 50% left common carotid artery stenosis. 5.  Aortic Atherosclerosis (ICD10-I70.0). 6. Thyroid nodules measuring up to 2.9 cm. Nonemergent thyroid ultrasound recommended for further evaluation. These results were communicated to Dr. Amada Jupiter at  10:18 pm on 07/05/2017 by text page via the [facility] messaging system. Electronically Signed   By: Sebastian Ache M.D.   On: 07/05/2017 22:47   Dg Chest Port 1 View  Result Date: 07/06/2017 CLINICAL DATA:  Cardiomyopathy, coronary artery disease with stent placement, chronic renal insufficiency. EXAM: PORTABLE CHEST 1 VIEW COMPARISON:  Chest x-ray of March 04, 2017 FINDINGS: The lungs are borderline hypoinflated but clear. The heart is top-normal in size. The pulmonary vascularity is normal. There is no pleural effusion. There are post CABG changes. There is a vascular graft in the left axillary region. The bony thorax exhibits no acute abnormality. IMPRESSION: Top-normal cardiac size without pulmonary edema or pleural effusion. No pneumonia. Electronically Signed   By: David  [place] M.D.   On: 07/06/2017 07:57   Ir Percutaneous Art Thrombectomy/infusion Intracranial Inc Diag Angio  Result Date: 07/07/2017 INDICATION: Flaccid right and arm leg. Left gaze deviation. Dysarthria. Occluded inferior division of the left middle cerebral artery. EXAM: 1. EMERGENT LARGE VESSEL OCCLUSION THROMBOLYSIS (anterior CIRCULATION) COMPARISON:  CT angiogram of the head and neck of 07/05/2017. MEDICATIONS: Ancef 2 g IV antibiotic was administered within 1 hour of the procedure. ANESTHESIA/SEDATION: General anesthesia. CONTRAST:  Isovue 300 approximately 70 cc. FLUOROSCOPY TIME:  Fluoroscopy Time: 27 minutes 12 seconds (2951 mGy). COMPLICATIONS: None immediate. TECHNIQUE: Following a full explanation of the procedure along with the potential associated  complications, an informed witnessed consent was obtained. The risks of intracranial hemorrhage of 10%, worsening neurological deficit, ventilator dependency, death and inability to revascularize were all reviewed in detail with the patient's family. The patient was then put under general anesthesia by the Department of Anesthesiology at [facility]. The right groin was prepped and draped in the usual sterile fashion. Thereafter using modified Seldinger technique, transfemoral access into the right common femoral artery was obtained without difficulty. Over a 0.035 inch guidewire a 5 French Pinnacle sheath was inserted. Through this, and also over a 0.035 inch guidewire a 5 [place] JB 1 catheter was advanced to the aortic arch region and selectively positioned in the right common carotid artery, the right vertebral artery, the left vertebral artery and the left common carotid artery. FINDINGS: The left common carotid arteriogram demonstrates approximately 50% stenosis of the mid segment of the left common carotid artery. The left external carotid artery and its major branches are widely patent. The left internal carotid artery at the bulb demonstrates wide patency. More distally, there is mild decreased caliber of the left internal carotid artery in its entirety. The petrous, cavernous and supraclinoid segments demonstrate wide patency. The left anterior cerebral artery demonstrates complete occlusion distal to the left A1 segment. This is associated with caliber irregularity in the A1 segment. The left middle cerebral artery M1 segment proximally demonstrates mild caliber irregularity. There is patency of the superior division of the left middle cerebral artery with delayed reconstitution of the distal inferior division of the left middle cerebral artery in the M2 M3 regions. Distal to this the M3 branches and the M4 branches demonstrated gradual ascent of contrast. The inferior division appears to be  completely occluded associated with a stump. A diminutive anterior temporal branch is noted. A prominent anterior choroidal artery on the left is noted with delayed arterial blush noted in the region of the splenium of the corpus callosum. The whole head delayed arterial phase in the lateral projection demonstrates a prominent area of hypoperfusion involving the left parietal cortical and subcortical regions. The left vertebral artery  origin is from the thyrocervical trunk. The vessel is seen to opacify to the cranial skull base. Wide patency is seen of the left vertebrobasilar junction and the left posterior inferior cerebellar artery. The basilar artery, the posterior cerebral arteries, the superior cerebellar arteries and the anterior-inferior cerebellar arteries opacify into the capillary and venous phases. Prominent collaterals are seen in the P3 region of the left posterior cerebral artery retrogradely filling the posterior 1/3 of the left parietal cortical and subcortical areas. The delayed arterial phase then demonstrates retrograde opacification of the distal pericallosal artery in the splenium of the corpus callosum from the lateral thalamo perforator branches. Also demonstrated is retrograde opacification of the posterior half of the anterior temporal region from the posterior temporal branches of the P1 P2 region of the left posterior cerebral artery. The right common carotid arteriogram demonstrates mild narrowing in the mid third of the right common carotid artery. The right external carotid artery and its major branches are widely patent. The right internal carotid artery at the bulb to the cranial skull base is seen to opacify unimpeded. The petrous segment is normal. There is mild narrowing of the caval cavernous segment of the right internal carotid artery. Distal to this the supraclinoid segment demonstrates mild decrease in caliber. The right middle cerebral artery demonstrates a 50% stenosis in  the distal right M1 segment. The proximal superior and the inferior divisions demonstrate moderate stenosis. Again demonstrated is a prominent anterior choroidal artery on the right side. The delayed arterial phase demonstrates hypoperfusion of the parietal cortical and subcortical regions. The right vertebral artery origin is from the right thyrocervical trunk. Again this vessel is seen to opacify to the cranial skull base. Wide patency is seen of the right vertebrobasilar junction and the right posterior-inferior cerebellar artery. Multiple focal areas of caliber irregularity are seen involving the posterior-inferior cerebellar artery. The opacified portion of the basilar artery, the proximal posterior cerebral arteries, the superior cerebellar arteries and the anterior-inferior cerebellar arteries are seen to opacify into the capillary and venous phases. Extensive collaterals are again seen from the P3 regions of both posterior cerebral arteries contributing to the posterior parietal middle cerebral artery distributions bilaterally as described earlier. PROCEDURE: ENDOVASCULAR TREATMENT OF OCCLUDED DOMINANT INFERIOR DIVISION OF THE LEFT MIDDLE CEREBRAL ARTERY The diagnostic JB 1 catheter in the left common carotid artery was exchanged over a 0.035 inch 8 French 55 cm Brite tip neurovascular sheath using biplane roadmap technique and constant fluoroscopic guidance. Good aspiration obtained from the hub of the 8 [place] Brite tip neurovascular sheath. A gentle contrast injection demonstrated no evidence of spasms, dissections or of intraluminal filling defects. This was then connected to continuous heparinized saline infusion. Over the [facility] exchange guidewire, an 85 cm 8 [place] of balloon FlowGate guide catheter which had been prepped with 50% contrast and 50% heparinized saline infusion was advanced and positioned just proximal to the left common carotid bifurcation. The guidewire was removed. Good aspiration was  obtained at the hub of the [facility] guide catheter. Over a 0.035 inch Roadrunner guidewire, using biplane roadmap technique and constant fluoroscopic guidance, the FlowGate guide catheter was then advanced to the mid cervical left ICA. The guidewire was removed. Again good aspiration was obtained from the hub of the 8 [place] FlowGate guide catheter. A gentle control arteriogram performed centered extracranially and intracranially demonstrated no change in the intracranial circulation. Over a 0.014 inch Softip Synchro micro guidewire, a Trevo ProVue 021 microcatheter was advanced to the distal end  of the of FlowGate guide catheter. With the micro guidewire leading with a J-tip configuration, the combination was navigated to the supraclinoid left ICA. The micro guidewire was then advanced gently into the inferior division origin followed by the microcatheter. A gentle manipulation of the micro guidewire was then performed using a torque device to encourage advancement through the occluded left middle cerebral artery. The microcatheter was then brought into close proximity to the site of the occlusion. Further manipulation was performed with partial advancement of the micro guidewire through the occluded left middle cerebral artery inferior division. To avoid potential perforation of the more distal inferior division the micro guidewire was retrieved. Good aspiration obtained from the hub of the microcatheter. Approximately 3.6 mg of super selective intracranial intra-arterial Integrilin was infused over a couple of minutes. Thereafter, further attempts were made to encourage advancement of a very soft microguidewire again with only partial success. 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 The procedure was then stopped. The final control arteriogram performed through the [facility] guide catheter in the left internal carotid artery continued to demonstrate no significant change in the intracranial  circulation. No angiographic evidence of extravasation or mass-effect or midline shift was seen. A Dyna CT of the head performed revealed no evidence of hemorrhage or change in the ventricular size or mass effect or midline shift. The [facility] guide catheter and the neurovascular sheath were then exchanged over a J-tip 0.035 guidewire for an 8 [place] Pinnacle sheath. This was then removed with successful hemostasis being achieved by manual compression. At the end of the procedure, the patient's right groin appeared soft without evidence of bleeding or hematoma. Distal pulses continued to be palpable in the dorsalis pedis, and the posterior tibial regions unchanged from prior to the procedure. The patient's general anesthesia was then reversed. The patient was extubated without difficulty. Upon recovery the patient was noted to be able to raise his right arm, and also bend his right knee. He responded appropriately to simple commands. He was then transferred to the PACU and then to neuro ICU for further management. IMPRESSION: Status post attempted revascularization of occluded dominant inferior division of the right middle cerebral artery using 3.6 mg of super selective intracranial intra-arterial Integrilin and mechanical thrombolysis with a micro guidewire. PLAN: Further management as per stroke team. Electronically Signed   By: Julieanne Cotton M.D.   On: 07/06/2017 10:05   Ct Head Code Stroke Wo Contrast  Result Date: 07/05/2017 CLINICAL DATA:  Code stroke. Right hemiparesis and difficulty speaking. EXAM: CT HEAD WITHOUT CONTRAST TECHNIQUE: Contiguous axial images were obtained from the base of the skull through the vertex without intravenous contrast. COMPARISON:  Brain MRI 04/27/2016 FINDINGS: Brain: There is loss of gray-white differentiation consistent with acute infarction involving the posterior left insula and left parietal lobe extending towards the vertex. No acute intracranial hemorrhage, mass,  midline shift, or extra-axial fluid collection is identified. Chronic lacunar infarcts are present in the basal ganglia and thalami bilaterally. There is a chronic right temporoparietal infarct. There is a small chronic left cerebellar infarct. Patchy cerebral white matter hypodensities are nonspecific but compatible with chronic small vessel ischemic disease, moderately advanced for age. Mild cerebral atrophy is advanced for age. Vascular: Calcified atherosclerosis at the skull base. No hyperdense vessel. Skull: No fracture or focal osseous lesion. Sinuses/Orbits: Visualized paranasal sinuses and mastoid air cells are clear. Orbits are unremarkable. Other: None. ASPECTS [facility] Stroke Program Early CT Score) - Ganglionic level infarction (caudate,  lentiform nuclei, internal capsule, insula, M1-M3 cortex): 5-6 - Supraganglionic infarction (M4-M6 cortex): 2 Total score (0-10 with 10 being normal): 7-8 IMPRESSION: 1. Acute left MCA infarct involving the insula and parietal lobe. No hemorrhage. 2. ASPECTS is 7-8. 3. Age advanced chronic ischemia. These results were communicated to Dr. Amada Jupiter at 9:54 pm on 07/05/2017 by text page via the [facility] messaging system. Electronically Signed   By: Sebastian Ache M.D.   On: 07/05/2017 21:59   Ir Angio Intra Extracran Sel Com Carotid Innominate Uni R Mod Sed  Result Date: 07/07/2017 INDICATION: Flaccid right and arm leg. Left gaze deviation. Dysarthria. Occluded inferior division of the left middle cerebral artery. EXAM: 1. EMERGENT LARGE VESSEL OCCLUSION THROMBOLYSIS (anterior CIRCULATION) COMPARISON:  CT angiogram of the head and neck of 07/05/2017. MEDICATIONS: Ancef 2 g IV antibiotic was administered within 1 hour of the procedure. ANESTHESIA/SEDATION: General anesthesia. CONTRAST:  Isovue 300 approximately 70 cc. FLUOROSCOPY TIME:  Fluoroscopy Time: 27 minutes 12 seconds (2951 mGy). COMPLICATIONS: None immediate. TECHNIQUE: Following a full explanation of the procedure  along with the potential associated complications, an informed witnessed consent was obtained. The risks of intracranial hemorrhage of 10%, worsening neurological deficit, ventilator dependency, death and inability to revascularize were all reviewed in detail with the patient's family. The patient was then put under general anesthesia by the Department of Anesthesiology at [facility]. The right groin was prepped and draped in the usual sterile fashion. Thereafter using modified Seldinger technique, transfemoral access into the right common femoral artery was obtained without difficulty. Over a 0.035 inch guidewire a 5 French Pinnacle sheath was inserted. Through this, and also over a 0.035 inch guidewire a 5 [place] JB 1 catheter was advanced to the aortic arch region and selectively positioned in the right common carotid artery, the right vertebral artery, the left vertebral artery and the left common carotid artery. FINDINGS: The left common carotid arteriogram demonstrates approximately 50% stenosis of the mid segment of the left common carotid artery. The left external carotid artery and its major branches are widely patent. The left internal carotid artery at the bulb demonstrates wide patency. More distally, there is mild decreased caliber of the left internal carotid artery in its entirety. The petrous, cavernous and supraclinoid segments demonstrate wide patency. The left anterior cerebral artery demonstrates complete occlusion distal to the left A1 segment. This is associated with caliber irregularity in the A1 segment. The left middle cerebral artery M1 segment proximally demonstrates mild caliber irregularity. There is patency of the superior division of the left middle cerebral artery with delayed reconstitution of the distal inferior division of the left middle cerebral artery in the M2 M3 regions. Distal to this the M3 branches and the M4 branches demonstrated gradual ascent of contrast. The  inferior division appears to be completely occluded associated with a stump. A diminutive anterior temporal branch is noted. A prominent anterior choroidal artery on the left is noted with delayed arterial blush noted in the region of the splenium of the corpus callosum. The whole head delayed arterial phase in the lateral projection demonstrates a prominent area of hypoperfusion involving the left parietal cortical and subcortical regions. The left vertebral artery origin is from the thyrocervical trunk. The vessel is seen to opacify to the cranial skull base. Wide patency is seen of the left vertebrobasilar junction and the left posterior inferior cerebellar artery. The basilar artery, the posterior cerebral arteries, the superior cerebellar arteries and the anterior-inferior cerebellar arteries opacify into the  capillary and venous phases. Prominent collaterals are seen in the P3 region of the left posterior cerebral artery retrogradely filling the posterior 1/3 of the left parietal cortical and subcortical areas. The delayed arterial phase then demonstrates retrograde opacification of the distal pericallosal artery in the splenium of the corpus callosum from the lateral thalamo perforator branches. Also demonstrated is retrograde opacification of the posterior half of the anterior temporal region from the posterior temporal branches of the P1 P2 region of the left posterior cerebral artery. The right common carotid arteriogram demonstrates mild narrowing in the mid third of the right common carotid artery. The right external carotid artery and its major branches are widely patent. The right internal carotid artery at the bulb to the cranial skull base is seen to opacify unimpeded. The petrous segment is normal. There is mild narrowing of the caval cavernous segment of the right internal carotid artery. Distal to this the supraclinoid segment demonstrates mild decrease in caliber. The right middle cerebral artery  demonstrates a 50% stenosis in the distal right M1 segment. The proximal superior and the inferior divisions demonstrate moderate stenosis. Again demonstrated is a prominent anterior choroidal artery on the right side. The delayed arterial phase demonstrates hypoperfusion of the parietal cortical and subcortical regions. The right vertebral artery origin is from the right thyrocervical trunk. Again this vessel is seen to opacify to the cranial skull base. Wide patency is seen of the right vertebrobasilar junction and the right posterior-inferior cerebellar artery. Multiple focal areas of caliber irregularity are seen involving the posterior-inferior cerebellar artery. The opacified portion of the basilar artery, the proximal posterior cerebral arteries, the superior cerebellar arteries and the anterior-inferior cerebellar arteries are seen to opacify into the capillary and venous phases. Extensive collaterals are again seen from the P3 regions of both posterior cerebral arteries contributing to the posterior parietal middle cerebral artery distributions bilaterally as described earlier. PROCEDURE: ENDOVASCULAR TREATMENT OF OCCLUDED DOMINANT INFERIOR DIVISION OF THE LEFT MIDDLE CEREBRAL ARTERY The diagnostic JB 1 catheter in the left common carotid artery was exchanged over a 0.035 inch 8 French 55 cm Brite tip neurovascular sheath using biplane roadmap technique and constant fluoroscopic guidance. Good aspiration obtained from the hub of the 8 [place] Brite tip neurovascular sheath. A gentle contrast injection demonstrated no evidence of spasms, dissections or of intraluminal filling defects. This was then connected to continuous heparinized saline infusion. Over the [facility] exchange guidewire, an 85 cm 8 [place] of balloon FlowGate guide catheter which had been prepped with 50% contrast and 50% heparinized saline infusion was advanced and positioned just proximal to the left common carotid bifurcation. The guidewire  was removed. Good aspiration was obtained at the hub of the [facility] guide catheter. Over a 0.035 inch Roadrunner guidewire, using biplane roadmap technique and constant fluoroscopic guidance, the FlowGate guide catheter was then advanced to the mid cervical left ICA. The guidewire was removed. Again good aspiration was obtained from the hub of the 8 [place] FlowGate guide catheter. A gentle control arteriogram performed centered extracranially and intracranially demonstrated no change in the intracranial circulation. Over a 0.014 inch Softip Synchro micro guidewire, a Trevo ProVue 021 microcatheter was advanced to the distal end of the of FlowGate guide catheter. With the micro guidewire leading with a J-tip configuration, the combination was navigated to the supraclinoid left ICA. The micro guidewire was then advanced gently into the inferior division origin followed by the microcatheter. A gentle manipulation of the micro guidewire was then performed using a  torque device to encourage advancement through the occluded left middle cerebral artery. The microcatheter was then brought into close proximity to the site of the occlusion. Further manipulation was performed with partial advancement of the micro guidewire through the occluded left middle cerebral artery inferior division. To avoid potential perforation of the more distal inferior division the micro guidewire was retrieved. Good aspiration obtained from the hub of the microcatheter. Approximately 3.6 mg of super selective intracranial intra-arterial Integrilin was infused over a couple of minutes. Thereafter, further attempts were made to encourage advancement of a very soft microguidewire again with only partial success. 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 The procedure was then stopped. The final control arteriogram performed through the [facility] guide catheter in the left internal carotid artery continued to demonstrate no  significant change in the intracranial circulation. No angiographic evidence of extravasation or mass-effect or midline shift was seen. A Dyna CT of the head performed revealed no evidence of hemorrhage or change in the ventricular size or mass effect or midline shift. The [facility] guide catheter and the neurovascular sheath were then exchanged over a J-tip 0.035 guidewire for an 8 [place] Pinnacle sheath. This was then removed with successful hemostasis being achieved by manual compression. At the end of the procedure, the patient's right groin appeared soft without evidence of bleeding or hematoma. Distal pulses continued to be palpable in the dorsalis pedis, and the posterior tibial regions unchanged from prior to the procedure. The patient's general anesthesia was then reversed. The patient was extubated without difficulty. Upon recovery the patient was noted to be able to raise his right arm, and also bend his right knee. He responded appropriately to simple commands. He was then transferred to the PACU and then to neuro ICU for further management. IMPRESSION: Status post attempted revascularization of occluded dominant inferior division of the right middle cerebral artery using 3.6 mg of super selective intracranial intra-arterial Integrilin and mechanical thrombolysis with a micro guidewire. PLAN: Further management as per stroke team. Electronically Signed   By: Julieanne Cotton M.D.   On: 07/06/2017 10:05   Ir Angio Vertebral Sel Vertebral Bilat Mod Sed  Result Date: 07/07/2017 INDICATION: Flaccid right and arm leg. Left gaze deviation. Dysarthria. Occluded inferior division of the left middle cerebral artery. EXAM: 1. EMERGENT LARGE VESSEL OCCLUSION THROMBOLYSIS (anterior CIRCULATION) COMPARISON:  CT angiogram of the head and neck of 07/05/2017. MEDICATIONS: Ancef 2 g IV antibiotic was administered within 1 hour of the procedure. ANESTHESIA/SEDATION: General anesthesia. CONTRAST:  Isovue 300  approximately 70 cc. FLUOROSCOPY TIME:  Fluoroscopy Time: 27 minutes 12 seconds (2951 mGy). COMPLICATIONS: None immediate. TECHNIQUE: Following a full explanation of the procedure along with the potential associated complications, an informed witnessed consent was obtained. The risks of intracranial hemorrhage of 10%, worsening neurological deficit, ventilator dependency, death and inability to revascularize were all reviewed in detail with the patient's family. The patient was then put under general anesthesia by the Department of Anesthesiology at [facility]. The right groin was prepped and draped in the usual sterile fashion. Thereafter using modified Seldinger technique, transfemoral access into the right common femoral artery was obtained without difficulty. Over a 0.035 inch guidewire a 5 French Pinnacle sheath was inserted. Through this, and also over a 0.035 inch guidewire a 5 [place] JB 1 catheter was advanced to the aortic arch region and selectively positioned in the right common carotid artery, the right vertebral artery, the left vertebral artery and the left  common carotid artery. FINDINGS: The left common carotid arteriogram demonstrates approximately 50% stenosis of the mid segment of the left common carotid artery. The left external carotid artery and its major branches are widely patent. The left internal carotid artery at the bulb demonstrates wide patency. More distally, there is mild decreased caliber of the left internal carotid artery in its entirety. The petrous, cavernous and supraclinoid segments demonstrate wide patency. The left anterior cerebral artery demonstrates complete occlusion distal to the left A1 segment. This is associated with caliber irregularity in the A1 segment. The left middle cerebral artery M1 segment proximally demonstrates mild caliber irregularity. There is patency of the superior division of the left middle cerebral artery with delayed reconstitution of the  distal inferior division of the left middle cerebral artery in the M2 M3 regions. Distal to this the M3 branches and the M4 branches demonstrated gradual ascent of contrast. The inferior division appears to be completely occluded associated with a stump. A diminutive anterior temporal branch is noted. A prominent anterior choroidal artery on the left is noted with delayed arterial blush noted in the region of the splenium of the corpus callosum. The whole head delayed arterial phase in the lateral projection demonstrates a prominent area of hypoperfusion involving the left parietal cortical and subcortical regions. The left vertebral artery origin is from the thyrocervical trunk. The vessel is seen to opacify to the cranial skull base. Wide patency is seen of the left vertebrobasilar junction and the left posterior inferior cerebellar artery. The basilar artery, the posterior cerebral arteries, the superior cerebellar arteries and the anterior-inferior cerebellar arteries opacify into the capillary and venous phases. Prominent collaterals are seen in the P3 region of the left posterior cerebral artery retrogradely filling the posterior 1/3 of the left parietal cortical and subcortical areas. The delayed arterial phase then demonstrates retrograde opacification of the distal pericallosal artery in the splenium of the corpus callosum from the lateral thalamo perforator branches. Also demonstrated is retrograde opacification of the posterior half of the anterior temporal region from the posterior temporal branches of the P1 P2 region of the left posterior cerebral artery. The right common carotid arteriogram demonstrates mild narrowing in the mid third of the right common carotid artery. The right external carotid artery and its major branches are widely patent. The right internal carotid artery at the bulb to the cranial skull base is seen to opacify unimpeded. The petrous segment is normal. There is mild narrowing of  the caval cavernous segment of the right internal carotid artery. Distal to this the supraclinoid segment demonstrates mild decrease in caliber. The right middle cerebral artery demonstrates a 50% stenosis in the distal right M1 segment. The proximal superior and the inferior divisions demonstrate moderate stenosis. Again demonstrated is a prominent anterior choroidal artery on the right side. The delayed arterial phase demonstrates hypoperfusion of the parietal cortical and subcortical regions. The right vertebral artery origin is from the right thyrocervical trunk. Again this vessel is seen to opacify to the cranial skull base. Wide patency is seen of the right vertebrobasilar junction and the right posterior-inferior cerebellar artery. Multiple focal areas of caliber irregularity are seen involving the posterior-inferior cerebellar artery. The opacified portion of the basilar artery, the proximal posterior cerebral arteries, the superior cerebellar arteries and the anterior-inferior cerebellar arteries are seen to opacify into the capillary and venous phases. Extensive collaterals are again seen from the P3 regions of both posterior cerebral arteries contributing to the posterior parietal middle cerebral artery  distributions bilaterally as described earlier. PROCEDURE: ENDOVASCULAR TREATMENT OF OCCLUDED DOMINANT INFERIOR DIVISION OF THE LEFT MIDDLE CEREBRAL ARTERY The diagnostic JB 1 catheter in the left common carotid artery was exchanged over a 0.035 inch 8 French 55 cm Brite tip neurovascular sheath using biplane roadmap technique and constant fluoroscopic guidance. Good aspiration obtained from the hub of the 8 [place] Brite tip neurovascular sheath. A gentle contrast injection demonstrated no evidence of spasms, dissections or of intraluminal filling defects. This was then connected to continuous heparinized saline infusion. Over the [facility] exchange guidewire, an 85 cm 8 [place] of balloon FlowGate guide  catheter which had been prepped with 50% contrast and 50% heparinized saline infusion was advanced and positioned just proximal to the left common carotid bifurcation. The guidewire was removed. Good aspiration was obtained at the hub of the [facility] guide catheter. Over a 0.035 inch Roadrunner guidewire, using biplane roadmap technique and constant fluoroscopic guidance, the FlowGate guide catheter was then advanced to the mid cervical left ICA. The guidewire was removed. Again good aspiration was obtained from the hub of the 8 [place] FlowGate guide catheter. A gentle control arteriogram performed centered extracranially and intracranially demonstrated no change in the intracranial circulation. Over a 0.014 inch Softip Synchro micro guidewire, a Trevo ProVue 021 microcatheter was advanced to the distal end of the of FlowGate guide catheter. With the micro guidewire leading with a J-tip configuration, the combination was navigated to the supraclinoid left ICA. The micro guidewire was then advanced gently into the inferior division origin followed by the microcatheter. A gentle manipulation of the micro guidewire was then performed using a torque device to encourage advancement through the occluded left middle cerebral artery. The microcatheter was then brought into close proximity to the site of the occlusion. Further manipulation was performed with partial advancement of the micro guidewire through the occluded left middle cerebral artery inferior division. To avoid potential perforation of the more distal inferior division the micro guidewire was retrieved. Good aspiration obtained from the hub of the microcatheter. Approximately 3.6 mg of super selective intracranial intra-arterial Integrilin was infused over a couple of minutes. Thereafter, further attempts were made to encourage advancement of a very soft microguidewire again with only partial success. 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  The procedure was then stopped. The final control arteriogram performed through the [facility] guide catheter in the left internal carotid artery continued to demonstrate no significant change in the intracranial circulation. No angiographic evidence of extravasation or mass-effect or midline shift was seen. A Dyna CT of the head performed revealed no evidence of hemorrhage or change in the ventricular size or mass effect or midline shift. The [facility] guide catheter and the neurovascular sheath were then exchanged over a J-tip 0.035 guidewire for an 8 [place] Pinnacle sheath. This was then removed with successful hemostasis being achieved by manual compression. At the end of the procedure, the patient's right groin appeared soft without evidence of bleeding or hematoma. Distal pulses continued to be palpable in the dorsalis pedis, and the posterior tibial regions unchanged from prior to the procedure. The patient's general anesthesia was then reversed. The patient was extubated without difficulty. Upon recovery the patient was noted to be able to raise his right arm, and also bend his right knee. He responded appropriately to simple commands. He was then transferred to the PACU and then to neuro ICU for further management. IMPRESSION: Status post attempted revascularization of occluded dominant inferior division of the  right middle cerebral artery using 3.6 mg of super selective intracranial intra-arterial Integrilin and mechanical thrombolysis with a micro guidewire. PLAN: Further management as per stroke team. Electronically Signed   By: Julieanne Cotton M.D.   On: 07/06/2017 10:05   TTE  - Left ventricle: Wall thickness was increased in a pattern of mild   LVH. Systolic function was mildly to moderately reduced. The   estimated ejection fraction was in the range of 40% to 45%.   Akinesis of the apicalanteroseptal, anterolateral, and apical   myocardium. Doppler parameters are consistent with abnormal  left   ventricular relaxation (grade 1 diastolic dysfunction). - Aortic valve: There was mild regurgitation.   PHYSICAL EXAM  Temp:  [97.9 F (36.6 C)-98.5 F (36.9 C)] 98 F (36.7 C) (04/25 0800) Pulse Rate:  [28-121] 31 (04/25 0900) Resp:  [12-26] 19 (04/25 0900) BP: (87-168)/(55-112) 165/85 (04/25 0900) SpO2:  [94 %-100 %] 100 % (04/25 0900)  General - Well nourished, well developed, in no apparent distress.  Ophthalmologic - fundi not visualized due to noncooperation.  Cardiovascular - bradycardia and irregular, but on tele with bigeminy PACs.  Neuro - awake alert, global aphasia, able to say "yes" only, able to mimic some gesture, able to do thumb up on the left, did not follow simple commands.  Left gaze preference, able to cross midline and attending to right side, bilateral gaze incomplete.  Blinking to visual threat on the left, however not blinking to the right.  PERRL, right facial droop.  Tongue midline.  Right UE flaccid even on pain stimulation. RLE 1/5 on pain summation. LUE and LLE purposeful movement and against gravity.  DTR 1+ and no Babinski. Sensation, coordination not cooperative and gait not tested.   ASSESSMENT/PLAN [name] is a 57 y.o. male with history of CAD/MI s/p stenting and CABG, cardiomyopathy with EF 35-40%, hypertension, hyperlipidemia, cognitive impairment, obesity, seizure, OSA, ESRD on hemodialysis admitted for AMS and right-sided weakness. No tPA given due to outside window.    NSTEMI  Hx of CAD/MI - s/p stenting and CABG  Overnight event with tachy and hypertension   EEG showed PVCs, tele long period of PVCs, today brady with bigeminy PACs  Troponin 0.62->11.47  K 3.4->4.0  Cardiology consulted  On ASA and plavix - continue   OK to heparinize if needed - moderate left MCA infarct now 5 days out  Stroke:  left MCA infarct, likely due to progressive large vessel atherosclerosis.  Chronic intracranial stenosis including  bilateral MCAs since 2015.   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 Yes; Fluid consistency: Thin   aspirin 81 mg daily and clopidogrel 75 mg daily prior to admission, now on ASA 325mg  clopidogrel 75 mg daily. Continue ASA 325 and plavix for 3 months and then plavix alone given intracranial stenosis.   Patient counseled to be compliant with his antithrombotic medications  Ongoing aggressive stroke risk factor management  Therapy recommendations: CIR  Disposition: Pending  History of stroke  12/2013, admitted for  hypotension after HD, MRI showed left caudate head acute infarct, chronic bilateral BG and pontine infarcts.  MRA severe ICAD involving bilateral MCAs.  Carotid Doppler negative.  LDL 89 and A1c 5.6.  Discharged with dual antiplatelet and Crestor 40  Follows with Dr. Terrace Arabia at [facility]  CHF with cardiomyopathy  EF 35 to 40% in 02/2017  TTE EF 40-45% this time  Overnight event with tachy and VTach  Cardiology consulted  On ASA and plavix now  ESRD on HD  Nephrology on board  HD scheduled MWF PTA  Plan to HD today  Cre 13.49->10.40 ->9.17->12.24  Hypertension / hypotension Overnight high BP - metoprolol IV  Off Cardene  Low BP on HD   Now on metoprolol 12.5mg  bid  Hydralazine IV PRN given bradycardia  BP goal 120-150 given severe intracranial stenosis  Hyperlipidemia  Home meds: Lipitor 40  LDL 131, goal < 70  Now on Lipitor 80  Continue statin at discharge  Other Stroke Risk  Factors  Obstructive sleep apnea, on CPAP at home  Other Active Problems  Cognitive impairment - following with Dr. Terrace Arabia at [facility]  ?? Seizure, not on meds  Difficulty IV access - now has left EJ  Hospital day # 4  This patient is critically ill due to NSTEMI, Vtach, left MCA stroke, ESRD on HD, hypotension, CHF, severe intracranial stenosis and at significant risk of neurological worsening, death form recurrent stroke, hemorrhagic conversion, seizure, renal failure, heart failure, cardiac arrest, cardiogenic shock. This patient's care requires constant monitoring of vital signs, hemodynamics, respiratory and cardiac monitoring, review of multiple databases, neurological assessment, discussion with family, other specialists and medical decision making of high complexity. I spent 45 minutes of neurocritical care time in the care of this patient. I discussed with Dr. Jillyn Hidden in neuro ICU.   Marvel Plan, MD PhD Stroke Neurology 07/09/2017 9:44 AM    To contact Stroke Continuity provider, please refer to [email or web address]. After hours, contact General Neurology

## 2017-07-09 NOTE — Progress Notes (Signed)
Physical Therapy Treatment Patient Details Name: [name] MRN: [id number] DOB: 24-Jan-1961 Today's Date: 07/09/2017    History of Present Illness 57 y.o. male with a history of ESRD, previous stroke. Presenting with new right weakness, aphasia, and neglect. MRI showing acute left MCA territory cortical infarct     PT Comments    Pt making slow progress, limited mostly by his inattention and global aphasia.  Emphasis on sitting balance and standing balance.   Follow Up Recommendations  CIR     Equipment Recommendations  Other (comment)(TBA)    Recommendations for Other Services Rehab consult     Precautions / Restrictions Precautions Precautions: Fall    Mobility  Bed Mobility Overal bed mobility: Needs Assistance Bed Mobility: Supine to Sit;Sit to Supine     Supine to sit: +2 for physical assistance;Mod assist Sit to supine: Mod assist;+2 for physical assistance      Transfers Overall transfer level: Needs assistance Equipment used: (chairback) Transfers: Sit to/from Stand Sit to Stand: Mod assist;+2 physical assistance         General transfer comment: cues for hand placement, assist to both come forward and up  Ambulation/Gait                 Stairs             Wheelchair Mobility    Modified Rankin (Stroke Patients Only) Modified Rankin (Stroke Patients Only) Pre-Morbid Rankin Score: No symptoms Modified Rankin: Severe disability     Balance Overall balance assessment: Needs assistance Sitting-balance support: No upper extremity supported;Feet supported;Single extremity supported Sitting balance-Leahy Scale: Poor Sitting balance - Comments: today, pt leaning moderately heavily to the right.  with multimodal cues, pt sometimes understands and makes attempts to correct to midline with assist.  Unable to maintain without support.   Standing balance support: During functional activity;Single extremity supported;Bilateral upper  extremity supported Standing balance-Leahy Scale: Poor Standing balance comment: stood x3 using chair back for support, working on balance, upright posture in midline, symetrical stance                            Cognition Arousal/Alertness: Awake/alert Behavior During Therapy: Flat affect Overall Cognitive Status: Impaired/Different from baseline Area of Impairment: Attention;Memory;Following commands;Safety/judgement;Awareness;Problem solving                   Current Attention Level: Focused;Sustained;Selective Memory: Decreased recall of precautions Following Commands: Follows multi-step commands consistently Safety/Judgement: Decreased awareness of safety;Decreased awareness of deficits Awareness: Intellectual Problem Solving: Slow processing;Decreased initiation;Difficulty sequencing;Requires verbal cues;Requires tactile cues General Comments: little to no attention directed to the right side unless maximal cues to direct attention.      Exercises      General Comments General comments (skin integrity, edema, etc.): HR in the 120's otherwise vss      Pertinent Vitals/Pain Pain Assessment: Faces Faces Pain Scale: No hurt Pain Intervention(s): Monitored during session    Home Living                      Prior Function            PT Goals (current goals can now be found in the care plan section) Acute Rehab PT Goals Patient Stated Goal: Unstated' PT Goal Formulation: Patient unable to participate in goal setting Time For Goal Achievement: 07/20/17 Potential to Achieve Goals: Fair Progress towards PT goals: Progressing toward goals  Frequency    Min 4X/week      PT Plan Current plan remains appropriate    Co-evaluation              AM-PAC PT "6 Clicks" Daily Activity  Outcome Measure  Difficulty turning over in bed (including adjusting bedclothes, sheets and blankets)?: Unable Difficulty moving from lying on back to  sitting on the side of the bed? : Unable Difficulty sitting down on and standing up from a chair with arms (e.g., wheelchair, bedside commode, etc,.)?: Unable Help needed moving to and from a bed to chair (including a wheelchair)?: A Lot Help needed walking in hospital room?: Total Help needed climbing 3-5 steps with a railing? : Total 6 Click Score: 7    End of Session Equipment Utilized During Treatment: Other (comment) Activity Tolerance: Patient tolerated treatment well Patient left: in bed;with call bell/phone within reach;with bed alarm set;Other (comment)(going imminently to HD) Nurse Communication: Mobility status PT Visit Diagnosis: Other symptoms and signs involving the nervous system (R29.898);Other abnormalities of gait and mobility (R26.89);Unsteadiness on feet (R26.81)     Time: 1610-9604 PT Time Calculation (min) (ACUTE ONLY): 33 min  Charges:  $Therapeutic Activity: 8-22 mins $Neuromuscular Re-education: 8-22 mins                    G Codes:       07/29/2017  [place] Bing, PT [phone]  (pager)   Eliseo Gum Stark Aguinaga Jul 29, 2017, 5:43 PM

## 2017-07-09 NOTE — Progress Notes (Signed)
PULMONARY / CRITICAL CARE MEDICINE   Name: [name] MRN: [id number] DOB: 04/19/1960    ADMISSION DATE:  07/05/2017   CHIEF COMPLAINT: CVA with subsequent tachyarrhythmias and elevation in troponin.  HISTORY OF PRESENT ILLNESS:        This is a 57 year old with end-stage renal disease on dialysis, a history of coronary artery disease, and history of cognitive impairment who presented with new onset right hemiplegia.  He was outside of the window for TPA.  He was found to have a left M2 occlusion and underwent mechanical thrombectomy.  Overnight last night he had some difficulties with intermittent bradycardia and hypertension and has had one nonsustained run of an undefined tachycardia.  Unfortunately the patient is a phasic and he gives me no consistent history as to whether he is having chest pain nausea vomiting radiation of pain or shortness of breath.  Troponin has gone up to 11.47 but this is in the setting of end-stage renal disease.  PAST MEDICAL HISTORY :  He  has a past medical history of ACHALASIA, ANEMIA-NOS, CAD (coronary artery disease), 3 vessel significant disease. , Cardiomyopathy (HCC) (05/12/2017), CEREBROVASCULAR ACCIDENT, HX OF, CHF (congestive heart failure) (HCC), CKD (chronic kidney disease) stage 4, GFR 15-29 ml/min (HCC), Constipation, DEGENERATIVE JOINT DISEASE, SHOULDER, DEPRESSION, Family history of adverse reaction to anesthesia, GERD, Gout, H/O hiatal hernia, Headache(784.0), Heart attack (HCC), History of colon polyps, History of diastolic dysfunction, grade 2 by echo, HYPERLIPIDEMIA, Hyperlipidemia, HYPERTENSION, Impaired glucose tolerance, Insomnia, NSTEMI (non-ST elevated myocardial infarction), 06/30/11 (07/01/2011), RENAL CALCULUS, HX OF, RENAL INSUFFICIENCY, S/P coronary artery stent placement, PTCA/DES Resolute to mid-LAD via the LIMA graft, and PTCA of apical 95% stenosis 07/05/11 (07/04/2011), S/P PTCA (percutaneous transluminal coronary angioplasty), 06/30/11  (07/01/2011), Seizures (HCC), Sleep apnea, Stroke (HCC), and Thyroid disease.  PAST SURGICAL HISTORY: He  has a past surgical history that includes Tonsillectomy; Stomach surgery (2009); Knee surgery (Right, 01/07/2011); Insertion of dialysis catheter (04/01/2011); Cardiac catheterization; AV fistula placement (12/02/2011); Subclavian stent placement (12/20/2011); Coronary artery bypass graft (04/01/2011); Coronary angioplasty; Colonoscopy; Ligation of arteriovenous  fistula (Left, 06/08/2012); AV fistula placement (Left, 06/08/2012); left heart catheterization with coronary angiogram (N/A, 03/28/2011); left heart catheterization with coronary/graft angiogram (N/A, 06/30/2011); Percutaneous coronary intervention-balloon only (06/30/2011); percutaneous coronary stent intervention (pci-s) (N/A, 07/04/2011); arch aortogram (N/A, 02/19/2012); Hernia repair (2013); Bascilic vein transposition (Left, 09/06/2014); NM MYOCAR PERF EJECTION FRACTION (06/25/2011); Bascilic vein transposition (Left, 12/06/2014); Revision of arteriovenous goretex graft (Left, 12/06/2014); Tonsillectomy; LEFT HEART CATH AND CORS/GRAFTS ANGIOGRAPHY (N/A, 03/05/2017); Radiology with anesthesia (N/A, 07/05/2017); IR ANGIO INTRA EXTRACRAN SEL COM CAROTID INNOMINATE UNI R MOD SED (07/06/2017); IR ANGIO VERTEBRAL SEL VERTEBRAL BILAT MOD SED (07/06/2017); IR PERCUTANEOUS ART THROMBECTOMY/INFUSION INTRACRANIAL INC DIAG ANGIO (07/06/2017); and IR CT Head Ltd (07/06/2017).  Allergies  Allergen Reactions  . Shrimp [Shellfish Allergy] Shortness Of Breath  . Atorvastatin Other (See Comments)    weakness  . Insulins Other (See Comments)    Patient unsure as to the kind of insulin but states that it has previously caused seizures.  Most recent hospitalization (Jan 2013) received insulin but did not have reaction. LIKELY DUE TO SIGNIFICANT HYPOGLYCEMIA  . Simvastatin Other (See Comments)     abnormal liver tests  . Ultram [Tramadol] Other (See Comments)    Liver  enzyme     No current facility-administered medications on file prior to encounter.    Current Outpatient Medications on File Prior to Encounter  Medication Sig  . amLODipine (NORVASC) 5 MG tablet TAKE  ONE TABLET BY MOUTH ONE TIME DAILY   . atorvastatin (LIPITOR) 40 MG tablet Take 1 tablet (40 mg total) by mouth daily.  . cloNIDine (CATAPRES) 0.2 MG tablet Take 1 tablet (0.2 mg total) by mouth 2 (two) times daily.  . clopidogrel (PLAVIX) 75 MG tablet Take 1 tablet (75 mg total) by mouth daily.  Marland Kitchen doxercalciferol (HECTOROL) 4 MCG/2ML injection Inject 2.5 mLs (5 mcg total) into the vein every Monday, Wednesday, and Friday with hemodialysis.  . ferric gluconate 125 mg in sodium chloride 0.9 % 100 mL Inject 125 mg into the vein every Monday, Wednesday, and Friday with hemodialysis.  Marland Kitchen memantine (NAMENDA) 10 MG tablet Take 1 tablet (10 mg total) by mouth 2 (two) times daily.  . metoprolol tartrate (LOPRESSOR) 50 MG tablet Take 1 tablet (50 mg total) by mouth 2 (two) times daily.  . nitroGLYCERIN (NITROSTAT) 0.4 MG SL tablet PLACE 1 TABLET UNDER TONGUE AS NEEDED FOR CHEST PAIN EVERY 5 MINUTES (MAX 3 DOSES)  . terazosin (HYTRIN) 10 MG capsule TAKE 1 CAPSULE BY MOUTH DAILY AT BEDTIME  . zolpidem (AMBIEN) 10 MG tablet Take 1 tablet (10 mg total) by mouth at bedtime as needed. for sleep  . aspirin EC 81 MG EC tablet Take 1 tablet (81 mg total) by mouth daily.  Marland Kitchen ketoconazole (NIZORAL) 2 % cream Apply 1 application topically daily.  Marland Kitchen lanthanum ([name]) 1000 MG chewable tablet Chew 2 tablets (2,000 mg total) by mouth 3 (three) times daily with meals. Yearly physical due in July must see MD for refills    FAMILY HISTORY:  His indicated that his mother is alive. He indicated that his father is alive. He indicated that the status of his other is unknown.   SOCIAL HISTORY: He  reports that he has never smoked. He quit smokeless tobacco use about 14 years ago. His smokeless tobacco use included chew.  He reports that he drinks about 1.2 oz of alcohol per week. He reports that he does not use drugs.  REVIEW OF SYSTEMS:   Not obtainable  SUBJECTIVE:  Not obtainable  VITAL SIGNS: BP (!) 143/77   Pulse (!) 31   Temp 98 F (36.7 C) (Oral)   Resp (!) 22   Ht 5\' 11"  (1.803 m)   Wt 193 lb 9 oz (87.8 kg)   SpO2 100%   BMI 27.00 kg/m   HEMODYNAMICS:    VENTILATOR SETTINGS:    INTAKE / OUTPUT: I/O last 3 completed shifts: In: 1307 [P.O.:680; I.V.:390; NG/GT:237] Out: -   PHYSICAL EXAMINATION: General: Breathing comfortably without assistance and in no obvious distress. Neuro: He is nonverbal for me, he occasionally nods to questions but not with any consistency.  He is not following any instructions.  He does have spontaneous movement of the left but not the right side.  Pupils appear to be equal and EOMs appear to be full Cardiovascular: S1 and S2 are currently regular without murmur rub or gallop.  He has 2+ dorsalis pedis pulses the feet are warm there is no dependent edema Lungs: Respirations are unlabored there is symmetric air movement, no dependent rales. Abdomen: Abdomen is soft without any organomegaly masses or tenderness.   LABS:  BMET Recent Labs  Lab 07/08/17 0406 07/08/17 2100 07/09/17 0750  NA 135 136 137  K 3.7 3.4* 4.0  CL 96* 95* 98*  CO2 21* 25 21*  BUN 16 23* 27*  CREATININE 9.17* 11.20* 12.24*  GLUCOSE 86 147* 95  Electrolytes Recent Labs  Lab 07/07/17 0804 07/08/17 0406 07/08/17 2100 07/09/17 0750  CALCIUM 9.2 10.1 10.4* 10.5*  MG  --   --  2.1  --   PHOS 4.5  --   --   --     CBC Recent Labs  Lab 07/08/17 0406 07/08/17 2100 07/09/17 0447  WBC 8.5 7.5 9.3  HGB 13.2 13.1 13.0  HCT 39.7 38.9* 39.6  PLT 220 229 169    Coag's Recent Labs  Lab 07/05/17 2215  APTT 22*  INR 1.16    Sepsis Markers No results for input(s): LATICACIDVEN, PROCALCITON, O2SATVEN in the last 168 hours.  ABG No results for input(s): PHART,  PCO2ART, PO2ART in the last 168 hours.  Liver Enzymes Recent Labs  Lab 07/05/17 2215 07/07/17 0804 07/08/17 2100  AST 15  --  21  ALT 9*  --  <5*  ALKPHOS 40  --  49  BILITOT 1.0  --  0.9  ALBUMIN 3.9 3.3* 3.6    Cardiac Enzymes Recent Labs  Lab 07/08/17 2100 07/09/17 0750  TROPONINI 0.62* 11.47*    Glucose Recent Labs  Lab 07/05/17 2228 07/06/17 0158 07/08/17 1149 07/08/17 1708 07/08/17 2153 07/09/17 0901  GLUCAP 125* 97 102* 110* 149* 92    Imaging Ct Head Wo Contrast  Result Date: 07/08/2017 CLINICAL DATA:  57 y/o M; stroke with sudden increase in blood pressure. EXAM: CT HEAD WITHOUT CONTRAST TECHNIQUE: Contiguous axial images were obtained from the base of the skull through the vertex without intravenous contrast. COMPARISON:  07/06/2017 CT head. FINDINGS: Brain: Small area of loss of Mallory Enriques-white differentiation within the left parietal lobe is similar in distribution comparison with area of infarction on MRI and is compatible with evolving late acute to early subacute infarction. No new area of acute infarction, hemorrhage, or focal mass effect identified. Stable small chronic infarction in the right posterior temporal lobe, left hemi pons, left inferior cerebellum, and lacunar infarctions within bilateral anterior basal ganglia. Stable background of chronic microvascular ischemic changes and parenchymal volume loss of the brain. Vascular: Persistent contrast within the venous and arterial systems. Skull: Normal. Negative for fracture or focal lesion. Sinuses/Orbits: No acute finding. Other: None. IMPRESSION: 1. Left parietal region of acute infarction is stable in comparison with prior MRI given differences in technique. No new acute intracranial abnormality identified. 2. Stable background of chronic infarctions, microvascular ischemic changes, parenchymal volume loss of the brain. Electronically Signed   By: Mitzi Hansen M.D.   On: 07/08/2017 20:03      DISCUSSION:      This is a 57 year old with end-stage renal disease and a prior history of coronary artery disease who presented with a new left MCA territory infarct and right hemiplegia.  He underwent a mechanical thrombectomy.  Overnight he has had some hemodynamic instability and nonsustained arrhythmias and this morning he has a troponin elevated to 11.45.  EKG is abnormal at baseline but to my eye there is some slight lateral ST elevation which was not present on 02/22/2017.  ASSESSMENT / PLAN:  PULMONARY A: Currently  no active issues   CARDIOVASCULAR A: New cardiac ischemia.  He is already on aspirin Plavix and a statin.  I discussed this with neurology who feels there is no contraindication to full dose anticoagulation.  I have added some topical nitrates for blood pressure control.  Radiology has been consulted.  RENAL A: End-stage renal disease on dialysis    NEUROLOGIC A: Left MCA territory infarct with  subsequent aphasia and right hemiparesis.   Penny Pia, MD  Critical Care Medicine [facility] Pager: [phone]  07/09/2017, 10:30 AM

## 2017-07-09 NOTE — Progress Notes (Signed)
CRITICAL VALUE ALERT  Critical Value:  Troponin 11.47  Date & Time Notied:  07/09/17  0912   Provider Notified: Dr Roda Shutters  Orders Received/Actions taken: MD notified, cardiology consult requested

## 2017-07-09 NOTE — Progress Notes (Signed)
Patient keeps having frequent episodes where he will brady down into the low 30s on the monitor and coming back up into the 60s-70s shortly after. E-Link MD notified of irregular heart rhythm. No new orders at this time. Will continue to monitor.

## 2017-07-09 NOTE — Progress Notes (Signed)
Arrived to patient room 4N-24 at 1500.  Reviewed treatment plan and this RN agrees.  Report received from bedside RN, Darl Pikes.  Consent verified.  Patient Alert, aphasic. Lung sounds diminished and clear to ausculation in all fields. Generalized non pitting edema. Cardiac: NSR to SB.  Prepped LUAVF with alcohol and cannulated with two 16 gauge needles.  Pulsation of blood noted.  Flushed access well with saline per protocol.  Connected and secured lines and initiated tx at 1520.  UF goal of 1500 mL and net fluid removal of 1000 mL.  Will continue to monitor.

## 2017-07-10 DIAGNOSIS — I214 Non-ST elevation (NSTEMI) myocardial infarction: Secondary | ICD-10-CM

## 2017-07-10 DIAGNOSIS — I236 Thrombosis of atrium, auricular appendage, and ventricle as current complications following acute myocardial infarction: Secondary | ICD-10-CM

## 2017-07-10 LAB — BASIC METABOLIC PANEL
ANION GAP: 14 (ref 5–15)
BUN: 13 mg/dL (ref 6–20)
CALCIUM: 9.8 mg/dL (ref 8.9–10.3)
CO2: 26 mmol/L (ref 22–32)
CREATININE: 7.71 mg/dL — AB (ref 0.61–1.24)
Chloride: 97 mmol/L — ABNORMAL LOW (ref 101–111)
GFR, EST AFRICAN AMERICAN: 8 mL/min — AB (ref >60)
GFR, EST NON AFRICAN AMERICAN: 7 mL/min — AB (ref >60)
Glucose, Bld: 107 mg/dL — ABNORMAL HIGH (ref 65–99)
Potassium: 3.8 mmol/L (ref 3.5–5.1)
SODIUM: 137 mmol/L (ref 135–145)

## 2017-07-10 LAB — CBC
HCT: 38.3 % — ABNORMAL LOW (ref 39.0–52.0)
HEMOGLOBIN: 12.7 g/dL — AB (ref 13.0–17.0)
MCH: 31.1 pg (ref 26.0–34.0)
MCHC: 33.2 g/dL (ref 30.0–36.0)
MCV: 93.9 fL (ref 78.0–100.0)
PLATELETS: 250 10*3/uL (ref 150–400)
RBC: 4.08 MIL/uL — AB (ref 4.22–5.81)
RDW: 14.9 % (ref 11.5–15.5)
WBC: 7.4 10*3/uL (ref 4.0–10.5)

## 2017-07-10 LAB — ECHOCARDIOGRAM COMPLETE
HEIGHTINCHES: 71 in
WEIGHTICAEL: 3097.02 [oz_av]

## 2017-07-10 LAB — GLUCOSE, CAPILLARY
GLUCOSE-CAPILLARY: 91 mg/dL (ref 65–99)
Glucose-Capillary: 91 mg/dL (ref 65–99)
Glucose-Capillary: 90 mg/dL (ref 65–99)

## 2017-07-10 LAB — HEPARIN LEVEL (UNFRACTIONATED)
Heparin Unfractionated: 0.28 IU/mL — ABNORMAL LOW (ref 0.30–0.70)
Heparin Unfractionated: <0.1 IU/mL — ABNORMAL LOW (ref 0.30–0.70)

## 2017-07-10 NOTE — Progress Notes (Signed)
Physical Therapy Treatment Patient Details Name: [name] MRN: [id number] DOB: 10-24-1960 Today's Date: 07/10/2017    History of Present Illness 57 y.o. male with a history of ESRD, previous stroke. Presenting with new right weakness, aphasia, and neglect. MRI showing acute left MCA territory cortical infarct     PT Comments    Pt continues to make slow progress, limited by inattention and weakness on right side and global aphasia.   Follow Up Recommendations  SNF;Other (comment)(pt will have no one that can stay with him after d/c)     Equipment Recommendations  Other (comment)(TBA)    Recommendations for Other Services Rehab consult     Precautions / Restrictions Precautions Precautions: Fall    Mobility  Bed Mobility Overal bed mobility: Needs Assistance Bed Mobility: Supine to Sit;Sit to Supine     Supine to sit: Mod assist;+2 for safety/equipment        Transfers Overall transfer level: Needs assistance Equipment used: (chairback) Transfers: Sit to/from [employer] Sit to Stand: Mod assist;+2 physical assistance   Squat pivot transfers: Max assist;+2 safety/equipment     General transfer comment: cues for hand placement, assist to both come forward and up  Ambulation/Gait                 Stairs             Wheelchair Mobility    Modified Rankin (Stroke Patients Only) Modified Rankin (Stroke Patients Only) Pre-Morbid Rankin Score: No symptoms Modified Rankin: Severe disability     Balance Overall balance assessment: Needs assistance Sitting-balance support: No upper extremity supported;Feet supported;Single extremity supported Sitting balance-Leahy Scale: Poor Sitting balance - Comments: pt initially leaning heavily right.  Over 10 minutes of sitting between standing trials and with some left sided inhibition, pt can be assisted toward midline.  He was unable to hold midline without significant assist    Standing balance support: During functional activity;Single extremity supported;Bilateral upper extremity supported Standing balance-Leahy Scale: Poor Standing balance comment: stood x3 using chair back for support, working on balance, upright posture in midline, symetrical stance.  Pt was up for minutes at a time each trial.  Attempted reaching tasks to the far left of midline with return to midline in attempt to get patient allow easy assist left or at least not resisting comeing left.                            Cognition Arousal/Alertness: Awake/alert Behavior During Therapy: Flat affect;WFL for tasks assessed/performed(occasional smile to therapist jokes or pt expelling gas.) Overall Cognitive Status: Impaired/Different from baseline Area of Impairment: Attention;Memory;Following commands;Safety/judgement;Awareness;Problem solving                   Current Attention Level: Focused;Sustained;Selective Memory: Decreased recall of precautions Following Commands: Follows multi-step commands consistently Safety/Judgement: Decreased awareness of safety;Decreased awareness of deficits Awareness: Intellectual Problem Solving: Slow processing;Decreased initiation;Difficulty sequencing;Requires verbal cues;Requires tactile cues General Comments: little to no attention directed to the right side unless maximal cues to direct attention.      Exercises      General Comments General comments (skin integrity, edema, etc.): pt's attention still wholly directed right, but with calling his name he can bring his attention to the left.      Pertinent Vitals/Pain Pain Assessment: Faces Faces Pain Scale: No hurt    Home Living  Prior Function            PT Goals (current goals can now be found in the care plan section) Acute Rehab PT Goals Patient Stated Goal: Unstated' PT Goal Formulation: Patient unable to participate in goal setting Time For  Goal Achievement: 07/20/17 Potential to Achieve Goals: Fair    Frequency    Min 3X/week      PT Plan Discharge plan needs to be updated;Frequency needs to be updated    Co-evaluation              AM-PAC PT "6 Clicks" Daily Activity  Outcome Measure  Difficulty turning over in bed (including adjusting bedclothes, sheets and blankets)?: Unable Difficulty moving from lying on back to sitting on the side of the bed? : Unable Difficulty sitting down on and standing up from a chair with arms (e.g., wheelchair, bedside commode, etc,.)?: Unable Help needed moving to and from a bed to chair (including a wheelchair)?: A Lot Help needed walking in hospital room?: Total Help needed climbing 3-5 steps with a railing? : Total 6 Click Score: 7    End of Session Equipment Utilized During Treatment: Other (comment) Activity Tolerance: Patient tolerated treatment well Patient left: in bed;with call bell/phone within reach;with bed alarm set;Other (comment)(going imminently to HD) Nurse Communication: Mobility status PT Visit Diagnosis: Other symptoms and signs involving the nervous system (R29.898);Other abnormalities of gait and mobility (R26.89);Unsteadiness on feet (R26.81)     Time: 1610-9604 PT Time Calculation (min) (ACUTE ONLY): 31 min  Charges:  $Therapeutic Activity: 8-22 mins $Neuromuscular Re-education: 8-22 mins                    G Codes:       07-22-17  [place] Bing, PT [phone]  (pager)   [name] Jul 22, 2017, 6:06 PM

## 2017-07-10 NOTE — Consult Note (Signed)
   [facility] Inpatient Consult   07/10/2017  [name] 1961-03-09 [id number]    [facility] Care Management follow up.   Chart reviewed. [name] remains on stepdown unit with orders to transfer to progressive care unit.   Will continue to follow for disposition plans and engage family at later time for potential [facility] Care Management follow up.    Raiford Noble, MSN-Ed, RN,BSN [facility] Liaison [phone]

## 2017-07-10 NOTE — Progress Notes (Signed)
Progress Note  Patient Name: [name] Date of Encounter: 07/10/2017  Primary Cardiologist: No primary care provider on file.   Subjective   Pt minnimally communicative   (Thumbs up)   Appears comfortabe   No CP   Inpatient Medications    Scheduled Meds: .  stroke: mapping our early stages of recovery book   Does not apply Once  . aspirin EC  325 mg Oral Daily  . atorvastatin  80 mg Oral Daily  . Chlorhexidine Gluconate Cloth  6 each Topical Q0600  . clopidogrel  75 mg Oral Daily  . doxercalciferol  2 mcg Intravenous Q M,W,F-HD  . febuxostat  40 mg Oral Daily  . feeding supplement (NEPRO CARB STEADY)  237 mL Oral BID BM  . mouth rinse  15 mL Mouth Rinse BID  . memantine  10 mg Oral BID  . metoprolol tartrate  12.5 mg Oral BID  . mupirocin ointment  1 application Nasal BID  . nitroGLYCERIN  1 inch Topical Q6H   Continuous Infusions: . sodium chloride    . sodium chloride    . heparin 1,100 Units/hr (07/10/17 0600)   PRN Meds: sodium chloride, sodium chloride, acetaminophen **OR** acetaminophen (TYLENOL) oral liquid 160 mg/5 mL **OR** acetaminophen, alteplase, heparin, hydrALAZINE, lidocaine (PF), lidocaine-prilocaine, ondansetron (ZOFRAN) IV, pentafluoroprop-tetrafluoroeth   Vital Signs    Vitals:   07/10/17 0300 07/10/17 0400 07/10/17 0500 07/10/17 0600  BP: 124/85 (!) 143/72 (!) 122/97 (!) 145/51  Pulse: 69 74 83 81  Resp: 14 15 13 20   Temp:  98.1 F (36.7 C)    TempSrc:  Oral    SpO2: 100% 100% 100% 100%  Weight:      Height:        Intake/Output Summary (Last 24 hours) at 07/10/2017 0759 Last data filed at 07/10/2017 0500 Gross per 24 hour  Intake 927.65 ml  Output 1000 ml  Net -72.35 ml   Filed Weights   07/07/17 1138 07/09/17 1513 07/09/17 1920  Weight: 193 lb 9 oz (87.8 kg) 198 lb 6.6 oz (90 kg) 196 lb 3.4 oz (89 kg)    Telemetry    SR  - Personally Reviewed  ECG      Physical Exam   GEN: 57 yo in nAD   Neck: No JVD Cardiac:  RRR, no murmurs, rubs, or gallops.  Respiratory: Clear to auscultation bilaterally. GI: Soft, nontender, non-distended  MS: No edema; No deformity. Neuro:  R sided weakness Psych: Normal affect   Labs    Chemistry Recent Labs  Lab 07/05/17 2215  07/07/17 0804  07/08/17 2100 07/09/17 0750 07/10/17 0403  NA 136   < > 135   < > 136 137 137  K 4.0   < > 3.4*   < > 3.4* 4.0 3.8  CL 94*   < > 100*   < > 95* 98* 97*  CO2 20*   < > 21*   < > 25 21* 26  GLUCOSE 135*   < > 89   < > 147* 95 107*  BUN 39*   < > 26*   < > 23* 27* 13  CREATININE 14.46*   < > 10.40*   < > 11.20* 12.24* 7.71*  CALCIUM 10.7*   < > 9.2   < > 10.4* 10.5* 9.8  PROT 7.4  --   --   --  7.0  --   --   ALBUMIN 3.9  --  3.3*  --  3.6  --   --   AST 15  --   --   --  21  --   --   ALT 9*  --   --   --  <5*  --   --   ALKPHOS 40  --   --   --  49  --   --   BILITOT 1.0  --   --   --  0.9  --   --   GFRNONAA 3*   < > 5*   < > 4* 4* 7*  GFRAA 4*   < > 6*   < > 5* 5* 8*  ANIONGAP 22*   < > 14   < > 16* 18* 14   < > = values in this interval not displayed.     Hematology Recent Labs  Lab 07/08/17 2100 07/09/17 0447 07/10/17 0403  WBC 7.5 9.3 7.4  RBC 4.18* 4.23 4.08*  HGB 13.1 13.0 12.7*  HCT 38.9* 39.6 38.3*  MCV 93.1 93.6 93.9  MCH 31.3 30.7 31.1  MCHC 33.7 32.8 33.2  RDW 14.6 14.8 14.9  PLT 229 169 250    Cardiac Enzymes Recent Labs  Lab 07/08/17 2100 07/09/17 0750  TROPONINI 0.62* 11.47*    Recent Labs  Lab 07/05/17 2231  TROPIPOC 0.08     BNPNo results for input(s): BNP, PROBNP in the last 168 hours.   DDimer No results for input(s): DDIMER in the last 168 hours.   Radiology    Ct Head Wo Contrast  Result Date: 07/08/2017 CLINICAL DATA:  57 y/o M; stroke with sudden increase in blood pressure. EXAM: CT HEAD WITHOUT CONTRAST TECHNIQUE: Contiguous axial images were obtained from the base of the skull through the vertex without intravenous contrast. COMPARISON:  07/06/2017 CT head.  FINDINGS: Brain: Small area of loss of gray-white differentiation within the left parietal lobe is similar in distribution comparison with area of infarction on MRI and is compatible with evolving late acute to early subacute infarction. No new area of acute infarction, hemorrhage, or focal mass effect identified. Stable small chronic infarction in the right posterior temporal lobe, left hemi pons, left inferior cerebellum, and lacunar infarctions within bilateral anterior basal ganglia. Stable background of chronic microvascular ischemic changes and parenchymal volume loss of the brain. Vascular: Persistent contrast within the venous and arterial systems. Skull: Normal. Negative for fracture or focal lesion. Sinuses/Orbits: No acute finding. Other: None. IMPRESSION: 1. Left parietal region of acute infarction is stable in comparison with prior MRI given differences in technique. No new acute intracranial abnormality identified. 2. Stable background of chronic infarctions, microvascular ischemic changes, parenchymal volume loss of the brain. Electronically Signed   By: Mitzi Hansen M.D.   On: 07/08/2017 20:03    Cardiac Studies   Echo 07/06/17    Study Conclusions  - Left ventricle: Wall thickness was increased in a pattern of mild LVH. Systolic function was mildly to moderately reduced. The estimated ejection fraction was in the range of 40% to 45%. Akinesis of the apicalanteroseptal, anterolateral, and apical myocardium. Doppler parameters are consistent with abnormal left ventricular relaxation (grade 1 diastolic dysfunction). - Aortic valve: There was mild regurgitation.     Patient Profile     57 y.o. male with history of CAD (s/p CABG in 2013), ESRD, HTN, CVA, HL, GERD and anemia   Admitted with CVA with R sided hemiplegia   Assessment & Plan    1.  CAD  Pt had NSTEMI  Troponin 11.47  Appears comfortable now   Doesn't communicate well.  Echo as noted above   IOn my review of echo there is small clot at apex  It is akinetic there   Needs anticoagulation  On heparin      2  Rhyhm   Remains in SR with Pvcs  3  Neuro  S/P L MCA CVA   Failed thrombectomy  Neuro is following  4  HTN   Defer BP control to neuro for now  5  Acute on chronic systolic  CHF  Volume is not bad.   For questions or updates, please contact [facility] Please consult [email or web address] for contact info under Cardiology/STEMI.      Signed, Dietrich Pates, MD  07/10/2017, 7:59 AM

## 2017-07-10 NOTE — Progress Notes (Addendum)
ANTICOAGULATION CONSULT NOTE  Pharmacy Consult:  Heparin Indication: chest pain/ACS  Heparin Dosing Weight: 86.8 kg  Labs: Recent Labs    07/08/17 2100 07/09/17 0447 07/09/17 0750 07/09/17 1650 07/10/17 0403 07/10/17 1438  HGB 13.1 13.0  --   --  12.7*  --   HCT 38.9* 39.6  --   --  38.3*  --   PLT 229 169  --   --  250  --   HEPARINUNFRC  --   --   --  0.51 0.28* <0.10*  CREATININE 11.20*  --  12.24*  --  7.71*  --   TROPONINI 0.62*  --  11.47*  --   --   --     Assessment: 47 YOM presented with CVA (did not receive TPA, s/p mechanical thrombectomy) and subsequent tachyarrhythmias and elevation in troponin. Pharmacy dosing IV heparin for ACS. CCM spoke with Neuro and no contraindication to anticoagulation.  Repeat heparin level is undetectable.  RN reported that patient has poor access and the pump has been beeping all day, likely not getting heparin.  RN assessment likely accurate given trend of previous levels.  RN will contact IV team to place a new line.    Goal of Therapy:  Heparin level 0.3-0.5 units/ml, no boluses Monitor platelets by anticoagulation protocol: Yes     Plan:  Continue heparin gtt at 1100 units/hr RN will update pharmacy of access issues   Trevyn Lumpkin D. Laney Potash, PharmD, BCPS, BCCCP Pager:  [phone] 07/10/2017, 4:22 PM   ==========================  Addendum: New line placed and heparin resumed at 1800, no change to rate Check 8 hr heparin level   Dorine Duffey D. Laney Potash, PharmD, BCPS, BCCCP Pager:  [phone] 07/10/2017, 6:51 PM

## 2017-07-10 NOTE — Care Management Note (Signed)
Case Management Note  Patient Details  Name: [name] MRN: [id number] Date of Birth: 1960-09-20  Subjective/Objective:  57 y.o. male with a history of ESRD, previous stroke presented on 07/05/17 with new right weakness, aphasia, and neglect. MRI showed acute left MCA territory cortical infarct.  PTA, pt independent, lives alone.                     Action/Plan: PT/OT recommending CIR; will request rehab consult.  Will follow for discharge planning as pt progresses.  Expected Discharge Date:                  Expected Discharge Plan:  Skilled Nursing Facility  In-House Referral:  Clinical Social Work  Discharge planning Services  CM Consult  Post Acute Care Choice:    Choice offered to:     DME Arranged:    DME Agency:     HH Arranged:    HH Agency:     Status of Service:  In process, will continue to follow  If discussed at Long Length of Stay Meetings, dates discussed:    Additional Comments:  07/10/17 J. [name], RN, BSN CIR has signed off, as pt does not have caregiver support at discharge.  Per pt's sister, they prefer [facility], as mother is there.  CSW aware.    Quintella Baton, RN, BSN  Trauma/Neuro ICU Case Manager [phone]

## 2017-07-10 NOTE — Progress Notes (Signed)
Assessment/Plan: 1. 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 Corliss Skains. CTA showed diffuse intracranial stenosis. 2. ESRD - HDThursday with limited UF to maintain hemodynamics 3. Acute hypertension and tachycardia, improved on BP meds  Subjective: Had 1000cc off with HD yesterday without hemodynamic issues  Objective: Vital signs in last 24 hours: Temp:  [97.6 F (36.4 C)-98.5 F (36.9 C)] 98.3 F (36.8 C) (04/26 0800) Pulse Rate:  [36-88] 73 (04/26 0800) Resp:  [13-22] 16 (04/26 0800) BP: (89-167)/(51-119) 142/57 (04/26 0800) SpO2:  [95 %-100 %] 100 % (04/26 0800) Weight:  [89 kg (196 lb 3.4 oz)-90 kg (198 lb 6.6 oz)] 89 kg (196 lb 3.4 oz) (04/25 1920) Weight change:   Intake/Output from previous day: 04/25 0701 - 04/26 0700 In: 927.7 [P.O.:487; I.V.:190.7] Out: 1000  Intake/Output this shift: No intake/output data recorded.  General appearance: alert and cooperative  Moving left side Right hemiplegia  Lab Results: Recent Labs    07/09/17 0447 07/10/17 0403  WBC 9.3 7.4  HGB 13.0 12.7*  HCT 39.6 38.3*  PLT 169 250   BMET:  Recent Labs    07/09/17 0750 07/10/17 0403  NA 137 137  K 4.0 3.8  CL 98* 97*  CO2 21* 26  GLUCOSE 95 107*  BUN 27* 13  CREATININE 12.24* 7.71*  CALCIUM 10.5* 9.8   No results for input(s): PTH in the last 72 hours. Iron Studies: No results for input(s): IRON, TIBC, TRANSFERRIN, FERRITIN in the last 72 hours. Studies/Results: Ct Head Wo Contrast  Result Date: 07/08/2017 CLINICAL DATA:  57 y/o M; stroke with sudden increase in blood pressure. EXAM: CT HEAD WITHOUT CONTRAST TECHNIQUE: Contiguous axial images were obtained from the base of the skull through the vertex without intravenous contrast. COMPARISON:  07/06/2017 CT head. FINDINGS: Brain: Small area of loss of gray-white differentiation within the left parietal lobe is similar in distribution  comparison with area of infarction on MRI and is compatible with evolving late acute to early subacute infarction. No new area of acute infarction, hemorrhage, or focal mass effect identified. Stable small chronic infarction in the right posterior temporal lobe, left hemi pons, left inferior cerebellum, and lacunar infarctions within bilateral anterior basal ganglia. Stable background of chronic microvascular ischemic changes and parenchymal volume loss of the brain. Vascular: Persistent contrast within the venous and arterial systems. Skull: Normal. Negative for fracture or focal lesion. Sinuses/Orbits: No acute finding. Other: None. IMPRESSION: 1. Left parietal region of acute infarction is stable in comparison with prior MRI given differences in technique. No new acute intracranial abnormality identified. 2. Stable background of chronic infarctions, microvascular ischemic changes, parenchymal volume loss of the brain. Electronically Signed   By: Mitzi Hansen M.D.   On: 07/08/2017 20:03    Scheduled: .  stroke: mapping our early stages of recovery book   Does not apply Once  . aspirin EC  325 mg Oral Daily  . atorvastatin  80 mg Oral Daily  . Chlorhexidine Gluconate Cloth  6 each Topical Q0600  . clopidogrel  75 mg Oral Daily  . doxercalciferol  2 mcg Intravenous Q M,W,F-HD  . febuxostat  40 mg Oral Daily  . feeding supplement (NEPRO CARB STEADY)  237 mL Oral BID BM  . mouth rinse  15 mL Mouth Rinse BID  . memantine  10 mg Oral BID  . metoprolol tartrate  12.5 mg Oral BID  . mupirocin ointment  1  application Nasal BID  . nitroGLYCERIN  1 inch Topical Q6H   Continuous: . sodium chloride    . sodium chloride    . heparin 1,100 Units/hr (07/10/17 0833)      LOS: 5 days   Lauris Poag 07/10/2017,9:13 AM

## 2017-07-10 NOTE — Progress Notes (Signed)
ANTICOAGULATION CONSULT NOTE  Pharmacy Consult for heparin Indication: chest pain/ACS  Heparin Dosing Weight: 86.8 kg  Labs: Recent Labs    07/08/17 2100 07/09/17 0447 07/09/17 0750 07/09/17 1650 07/10/17 0403  HGB 13.1 13.0  --   --  12.7*  HCT 38.9* 39.6  --   --  38.3*  PLT 229 169  --   --  250  HEPARINUNFRC  --   --   --  0.51 0.28*  CREATININE 11.20*  --  12.24*  --  7.71*  TROPONINI 0.62*  --  11.47*  --   --     Assessment: 51 yom presenting with CVA (did not receive TPA, s/p mechanical thrombectomy) with subsequent tachyarrhythmias and elevation in troponin. Pharmacy dosing IV heparin for ACS. CCM spoke with Neuro and no contraindication to anticoagulation.  Heparin level this AM is essentially therapeutic at 0.28. She was supratherapeutic on an infusion of 1150 units/hr. H/H slightly low. Plt wnl   Goal of Therapy:  Heparin level 0.3-0.5 units/ml, no boluses Monitor platelets by anticoagulation protocol: Yes   Plan:  Continue heparin at 1100 units/h F/u 8 hr HL  Daily heparin level/CBC Monitor s/sx bleeding  Vinnie Level, PharmD., BCPS Clinical Pharmacist Clinical phone for 07/10/17 until 3:30pm: [phone] If after 3:30pm, please call main pharmacy at: [phone]

## 2017-07-10 NOTE — Progress Notes (Signed)
PULMONARY / CRITICAL CARE MEDICINE   Name: [name] MRN: [id number] DOB: 1961-01-22    ADMISSION DATE:  07/05/2017   CHIEF COMPLAINT: CVA with subsequent tachyarrhythmias and elevation in troponin.  HISTORY OF PRESENT ILLNESS:        This is a 57 year old with end-stage renal disease on dialysis, a history of coronary artery disease, and history of cognitive impairment who presented with new onset right hemiplegia.  He was outside of the window for TPA.  He was found to have a left M2 occlusion and underwent mechanical thrombectomy.  Overnight last night he had some difficulties with intermittent bradycardia and hypertension and has had one nonsustained run of an undefined tachycardia.  Unfortunately the patient is aphasic and he gives me no consistent history as to whether he is having chest pain nausea vomiting radiation of pain or shortness of breath.  Troponin has gone up to 11.47 but this is in the setting of end-stage renal disease.      He has a persistent dense aphasia and is not answering any of my questions today.   PAST MEDICAL HISTORY :  He  has a past medical history of ACHALASIA, ANEMIA-NOS, CAD (coronary artery disease), 3 vessel significant disease. , Cardiomyopathy (HCC) (05/12/2017), CEREBROVASCULAR ACCIDENT, HX OF, CHF (congestive heart failure) (HCC), CKD (chronic kidney disease) stage 4, GFR 15-29 ml/min (HCC), Constipation, DEGENERATIVE JOINT DISEASE, SHOULDER, DEPRESSION, Family history of adverse reaction to anesthesia, GERD, Gout, H/O hiatal hernia, Headache(784.0), Heart attack (HCC), History of colon polyps, History of diastolic dysfunction, grade 2 by echo, HYPERLIPIDEMIA, Hyperlipidemia, HYPERTENSION, Impaired glucose tolerance, Insomnia, NSTEMI (non-ST elevated myocardial infarction), 06/30/11 (07/01/2011), RENAL CALCULUS, HX OF, RENAL INSUFFICIENCY, S/P coronary artery stent placement, PTCA/DES Resolute to mid-LAD via the LIMA graft, and PTCA of apical 95% stenosis  07/05/11 (07/04/2011), S/P PTCA (percutaneous transluminal coronary angioplasty), 06/30/11 (07/01/2011), Seizures (HCC), Sleep apnea, Stroke (HCC), and Thyroid disease.  PAST SURGICAL HISTORY: He  has a past surgical history that includes Tonsillectomy; Stomach surgery (2009); Knee surgery (Right, 01/07/2011); Insertion of dialysis catheter (04/01/2011); Cardiac catheterization; AV fistula placement (12/02/2011); Subclavian stent placement (12/20/2011); Coronary artery bypass graft (04/01/2011); Coronary angioplasty; Colonoscopy; Ligation of arteriovenous  fistula (Left, 06/08/2012); AV fistula placement (Left, 06/08/2012); left heart catheterization with coronary angiogram (N/A, 03/28/2011); left heart catheterization with coronary/graft angiogram (N/A, 06/30/2011); Percutaneous coronary intervention-balloon only (06/30/2011); percutaneous coronary stent intervention (pci-s) (N/A, 07/04/2011); arch aortogram (N/A, 02/19/2012); Hernia repair (2013); Bascilic vein transposition (Left, 09/06/2014); NM MYOCAR PERF EJECTION FRACTION (06/25/2011); Bascilic vein transposition (Left, 12/06/2014); Revision of arteriovenous goretex graft (Left, 12/06/2014); Tonsillectomy; LEFT HEART CATH AND CORS/GRAFTS ANGIOGRAPHY (N/A, 03/05/2017); Radiology with anesthesia (N/A, 07/05/2017); IR ANGIO INTRA EXTRACRAN SEL COM CAROTID INNOMINATE UNI R MOD SED (07/06/2017); IR ANGIO VERTEBRAL SEL VERTEBRAL BILAT MOD SED (07/06/2017); IR PERCUTANEOUS ART THROMBECTOMY/INFUSION INTRACRANIAL INC DIAG ANGIO (07/06/2017); and IR CT Head Ltd (07/06/2017).  Allergies  Allergen Reactions  . Shrimp [Shellfish Allergy] Shortness Of Breath  . Atorvastatin Other (See Comments)    weakness  . Insulins Other (See Comments)    Patient unsure as to the kind of insulin but states that it has previously caused seizures.  Most recent hospitalization (Jan 2013) received insulin but did not have reaction. LIKELY DUE TO SIGNIFICANT HYPOGLYCEMIA  . Simvastatin Other (See  Comments)     abnormal liver tests  . Ultram [Tramadol] Other (See Comments)    Liver enzyme     No current facility-administered medications on file prior to encounter.  Current Outpatient Medications on File Prior to Encounter  Medication Sig  . amLODipine (NORVASC) 5 MG tablet TAKE ONE TABLET BY MOUTH ONE TIME DAILY   . atorvastatin (LIPITOR) 40 MG tablet Take 1 tablet (40 mg total) by mouth daily.  . cloNIDine (CATAPRES) 0.2 MG tablet Take 1 tablet (0.2 mg total) by mouth 2 (two) times daily.  . clopidogrel (PLAVIX) 75 MG tablet Take 1 tablet (75 mg total) by mouth daily.  Marland Kitchen doxercalciferol (HECTOROL) 4 MCG/2ML injection Inject 2.5 mLs (5 mcg total) into the vein every Monday, Wednesday, and Friday with hemodialysis.  . ferric gluconate 125 mg in sodium chloride 0.9 % 100 mL Inject 125 mg into the vein every Monday, Wednesday, and Friday with hemodialysis.  Marland Kitchen memantine (NAMENDA) 10 MG tablet Take 1 tablet (10 mg total) by mouth 2 (two) times daily.  . metoprolol tartrate (LOPRESSOR) 50 MG tablet Take 1 tablet (50 mg total) by mouth 2 (two) times daily.  . nitroGLYCERIN (NITROSTAT) 0.4 MG SL tablet PLACE 1 TABLET UNDER TONGUE AS NEEDED FOR CHEST PAIN EVERY 5 MINUTES (MAX 3 DOSES)  . terazosin (HYTRIN) 10 MG capsule TAKE 1 CAPSULE BY MOUTH DAILY AT BEDTIME  . zolpidem (AMBIEN) 10 MG tablet Take 1 tablet (10 mg total) by mouth at bedtime as needed. for sleep  . aspirin EC 81 MG EC tablet Take 1 tablet (81 mg total) by mouth daily.  Marland Kitchen ketoconazole (NIZORAL) 2 % cream Apply 1 application topically daily.  Marland Kitchen lanthanum ([name]) 1000 MG chewable tablet Chew 2 tablets (2,000 mg total) by mouth 3 (three) times daily with meals. Yearly physical due in July must see MD for refills    FAMILY HISTORY:  His indicated that his mother is alive. He indicated that his father is alive. He indicated that the status of his other is unknown.   SOCIAL HISTORY: He  reports that he has never smoked. He  quit smokeless tobacco use about 14 years ago. His smokeless tobacco use included chew. He reports that he drinks about 1.2 oz of alcohol per week. He reports that he does not use drugs.  REVIEW OF SYSTEMS:   Not obtainable  SUBJECTIVE:  Not obtainable  VITAL SIGNS: BP (!) 160/63   Pulse 77   Temp 98.3 F (36.8 C) (Oral)   Resp 16   Ht 5\' 11"  (1.803 m)   Wt 196 lb 3.4 oz (89 kg)   SpO2 (!) 89%   BMI 27.37 kg/m   HEMODYNAMICS:    VENTILATOR SETTINGS:    INTAKE / OUTPUT: I/O last 3 completed shifts: In: 927.7 [P.O.:487; I.V.:190.7; Other:250] Out: 1000 [Other:1000]  PHYSICAL EXAMINATION: General: Breathing comfortably without assistance and in no obvious distress. Neuro: He is nonverbal and does not follow any instructions.  He is purposeful with the left and using the left upper extremity he has no movement on the right and has a right facial droop.  Pupils appear to be equal. Cardiovascular: S1 and S2 are currently regular without murmur rub or gallop.  I did not see any ectopy during my visit.   Lungs: Respirations are unlabored, there is symmetric air movement, no dependent rales.   Abdomen: Abdomen is soft without any organomegaly masses or tenderness.   LABS:  BMET Recent Labs  Lab 07/08/17 2100 07/09/17 0750 07/10/17 0403  NA 136 137 137  K 3.4* 4.0 3.8  CL 95* 98* 97*  CO2 25 21* 26  BUN 23* 27* 13  CREATININE 11.20* 12.24*  7.71*  GLUCOSE 147* 95 107*    Electrolytes Recent Labs  Lab 07/07/17 0804  07/08/17 2100 07/09/17 0750 07/10/17 0403  CALCIUM 9.2   < > 10.4* 10.5* 9.8  MG  --   --  2.1  --   --   PHOS 4.5  --   --   --   --    < > = values in this interval not displayed.    CBC Recent Labs  Lab 07/08/17 2100 07/09/17 0447 07/10/17 0403  WBC 7.5 9.3 7.4  HGB 13.1 13.0 12.7*  HCT 38.9* 39.6 38.3*  PLT 229 169 250    Coag's Recent Labs  Lab 07/05/17 2215  APTT 22*  INR 1.16    Sepsis Markers No results for input(s):  LATICACIDVEN, PROCALCITON, O2SATVEN in the last 168 hours.  ABG No results for input(s): PHART, PCO2ART, PO2ART in the last 168 hours.  Liver Enzymes Recent Labs  Lab 07/05/17 2215 07/07/17 0804 07/08/17 2100  AST 15  --  21  ALT 9*  --  <5*  ALKPHOS 40  --  49  BILITOT 1.0  --  0.9  ALBUMIN 3.9 3.3* 3.6    Cardiac Enzymes Recent Labs  Lab 07/08/17 2100 07/09/17 0750  TROPONINI 0.62* 11.47*    Glucose Recent Labs  Lab 07/09/17 0901 07/09/17 1241 07/09/17 1556 07/09/17 2118 07/10/17 0838 07/10/17 1230  GLUCAP 92 95 94 78 91 90    Imaging No results found.   DISCUSSION:      This is a 57 year old with end-stage renal disease and a prior history of coronary artery disease who presented with a new left MCA territory infarct and right hemiplegia.  He underwent a mechanical thrombectomy.  Overnight on the night of 4/24 he had some hemodynamic instability and nonsustained arrhythmias and in the morning had a troponin elevated to 11.45.  View of old cath films shows that he was well revascularized except for his distal LAD which had diffuse disease and it appears to be cardiology's opinion that only medical intervention would be appropriate at this time.  He has remained stable overnight, he is on aspirin Plavix and a statin as well as a beta-blocker and is currently on full dose heparin for a non-STEMI.  Repeat echocardiogram yesterday done with [facility] did not show evidence of an intracavitary thrombus which had been suspected on previous studies so it appears we do not have a long-term indication for full dose heparin.  He has been hemodynamically stable overnight and is appropriate for transfer out of the intensive care unit.  The critical care service will not routinely follow, please reconsult [place] if needed.   Penny Pia, MD  Critical Care Medicine [facility] Pager: [phone]  07/10/2017, 1:29 PM

## 2017-07-10 NOTE — Progress Notes (Addendum)
STROKE TEAM PROGRESS NOTE   SUBJECTIVE (INTERVAL HISTORY) Neuro exam stable. Appreciate Cardiology consult. Heparin gtt started for NSTEMI. No further arrhythmias overnight.   OBJECTIVE Temp:  [97.6 F (36.4 C)-98.5 F (36.9 C)] 98.3 F (36.8 C) (04/26 0800) Pulse Rate:  [36-88] 73 (04/26 0800) Cardiac Rhythm: Normal sinus rhythm (04/26 0400) Resp:  [13-22] 16 (04/26 0800) BP: (89-167)/(51-119) 142/57 (04/26 0800) SpO2:  [95 %-100 %] 100 % (04/26 0800) Weight:  [89 kg (196 lb 3.4 oz)-90 kg (198 lb 6.6 oz)] 89 kg (196 lb 3.4 oz) (04/25 1920)  Recent Labs  Lab 07/09/17 0901 07/09/17 1241 07/09/17 1556 07/09/17 2118 07/10/17 0838  GLUCAP 92 95 94 78 91   Recent Labs  Lab 07/07/17 0804 07/08/17 0406 07/08/17 2100 07/09/17 0750 07/10/17 0403  NA 135 135 136 137 137  K 3.4* 3.7 3.4* 4.0 3.8  CL 100* 96* 95* 98* 97*  CO2 21* 21* 25 21* 26  GLUCOSE 89 86 147* 95 107*  BUN 26* 16 23* 27* 13  CREATININE 10.40* 9.17* 11.20* 12.24* 7.71*  CALCIUM 9.2 10.1 10.4* 10.5* 9.8  MG  --   --  2.1  --   --   PHOS 4.5  --   --   --   --    Recent Labs  Lab 07/05/17 2215 07/07/17 0804 07/08/17 2100  AST 15  --  21  ALT 9*  --  <5*  ALKPHOS 40  --  49  BILITOT 1.0  --  0.9  PROT 7.4  --  7.0  ALBUMIN 3.9 3.3* 3.6   Recent Labs  Lab 07/05/17 2215  07/06/17 0510 07/07/17 0806 07/08/17 0406 07/08/17 2100 07/09/17 0447 07/10/17 0403  WBC 14.8*  --  12.0* 7.5 8.5 7.5 9.3 7.4  NEUTROABS 12.7*  --  9.0*  --   --  5.7  --   --   HGB 13.2   < > 11.4* 11.7* 13.2 13.1 13.0 12.7*  HCT 38.9*   < > 32.9* 34.9* 39.7 38.9* 39.6 38.3*  MCV 92.4  --  91.1 90.9 93.0 93.1 93.6 93.9  PLT 191  --  197 187 220 229 169 250   < > = values in this interval not displayed.   Recent Labs  Lab 07/08/17 2100 07/09/17 0750  TROPONINI 0.62* 11.47*       Component Value Date/Time   CHOL 180 07/06/2017 0510   CHOL 208 (H) 03/19/2017 1152   TRIG 98 07/06/2017 0510   HDL 29 (L) 07/06/2017 0510    HDL 40 03/19/2017 1152   CHOLHDL 6.2 07/06/2017 0510   VLDL 20 07/06/2017 0510   LDLCALC 131 (H) 07/06/2017 0510   LDLCALC 139 (H) 03/19/2017 1152   Lab Results  Component Value Date   HGBA1C 4.6 (L) 07/06/2017      Component Value Date/Time   LABOPIA NONE DETECTED 07/07/2017 1752   COCAINSCRNUR NONE DETECTED 07/07/2017 1752   LABBENZ NONE DETECTED 07/07/2017 1752   AMPHETMU NONE DETECTED 07/07/2017 1752   THCU NONE DETECTED 07/07/2017 1752   LABBARB NONE DETECTED 07/07/2017 1752   I have personally reviewed the radiological images below and agree with the radiology interpretations.  Ct Angio Head W Or Wo Contrast  Result Date: 07/05/2017 CLINICAL DATA:  Right hemiparesis and difficulty speaking. EXAM: CT ANGIOGRAPHY HEAD AND NECK CT PERFUSION BRAIN TECHNIQUE: Multidetector CT imaging of the head and neck was performed using the standard protocol during bolus administration of intravenous contrast. Multiplanar  CT image reconstructions and MIPs were obtained to evaluate the vascular anatomy. Carotid stenosis measurements (when applicable) are obtained utilizing NASCET criteria, using the distal internal carotid diameter as the denominator. Multiphase CT imaging of the brain was performed following IV bolus contrast injection. Subsequent parametric perfusion maps were calculated using RAPID software. CONTRAST:  ISOVUE-370 IOPAMIDOL (ISOVUE-370) INJECTION 76% COMPARISON:  Head MRA 12/29/2013.  Neck MRA 06/30/2005 FINDINGS: CTA NECK FINDINGS Aortic arch: Standard 3 vessel aortic arch. Widely patent arch vessel origins. Right carotid system: Patent with predominantly soft plaque in the common carotid artery resulting in less than 50% narrowing. Minimal calcified plaque at the carotid bifurcation. No ICA stenosis. Left carotid system: Patent with predominantly soft plaque in the distal common carotid artery resulting in 50% stenosis. Moderate calcified plaque about the carotid bifurcation.  No ICA stenosis. Vertebral arteries: Patent with the left being slightly dominant. Mild proximal right V2 stenosis due to calcified plaque. Calcified and soft plaque in the left V1 segment with mild multifocal stenoses. Skeleton: Mild cervical disc degeneration. Other neck: Multiple thyroid nodules including a 2.9 cm exophytic. Nodule extending inferiorly from the isthmus. Upper chest: Clear lung apices. Review of the MIP images confirms the above findings CTA HEAD FINDINGS Anterior circulation: The internal carotid arteries are patent from skull base to carotid termini with calcified plaque resulting in mild cavernous and supraclinoid stenosis on the right. The M1 segments are patent with mild stenosis on the right. The left M2 inferior division is occluded at its origin with some reconstitution of more distal M2 and M3 branches, however there are numerous missing distal branches in the left parietal region corresponding to the acute infarct. The prior MRA also suggested a severe stenosis or possibly short segment occlusion of the left M2 inferior division, however motion artifact on that study makes it difficult to make a detailed assessment of interval changes. There are also likely some missing distal left MCA superior division branch vessels. There also severe proximal right M2 stenoses which are at least partially chronic. The right A1 segment is occluded. The left A1 segment is patent but demonstrates a severe proximal stenosis. Both A2 segments are heavily diseased with multifocal severe stenoses and or short segment occlusions. The appearance of the ACAs is grossly similar to the prior motion degraded MRA. No aneurysm. Posterior circulation: The intracranial vertebral arteries are patent to the basilar. There is right greater than left V4 segment calcified plaque without significant stenosis. Patent SCA origins are identified bilaterally. PICA and AICA origins are not well evaluated. The basilar artery is  widely patent. Posterior communicating arteries are diminutive or absent. PCAs are patent with mild left P2 stenosis. No aneurysm. Venous sinuses: Inadequately assessed due to arterial phase contrast timing. Anatomic variants: None. Delayed phase: Not performed. Review of the MIP images confirms the above findings CT Brain Perfusion Findings: CBF (<30%) Volume: 28mL Perfusion (Tmax>6.0s) volume: , however a substantial amount of this is located in the right cerebral hemisphere Mismatch Volume: though with the caveat that this is artifactually inflated due to (likely chronic) prolonged perfusion in the right cerebral hemisphere. There is still however an apparent sizable penumbra in the posterior left cerebral hemisphere. Infarction Location:Predominantly left parietal lobe with a small amount of posterior left frontal lobe, posterior left temporal lobe, and posterior insula involvement. IMPRESSION: 1. 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 2. Left M2 inferior division occlusion at its origin with some distal reconstitution. Severe stenosis (or possibly short segment  occlusion) in this location on a prior MRA. Numerous missing distal branch vessels in the left parietal region corresponding to the acute infarct. 3. Advanced intracranial atherosclerosis with numerous severe stenoses as above. 4. 50% left common carotid artery stenosis. 5.  Aortic Atherosclerosis (ICD10-I70.0). 6. Thyroid nodules measuring up to 2.9 cm. Nonemergent thyroid ultrasound recommended for further evaluation. These results were communicated to Dr. Amada Jupiter at 10:18 pm on 07/05/2017 by text page via the [facility] messaging system. Electronically Signed   By: Sebastian Ache M.D.   On: 07/05/2017 22:47   Ct Head Wo Contrast  Result Date: 07/08/2017 CLINICAL DATA:  57 y/o M; stroke with sudden increase in blood pressure. EXAM: CT HEAD WITHOUT CONTRAST TECHNIQUE: Contiguous axial images were obtained from the base of  the skull through the vertex without intravenous contrast. COMPARISON:  07/06/2017 CT head. FINDINGS: Brain: Small area of loss of gray-white differentiation within the left parietal lobe is similar in distribution comparison with area of infarction on MRI and is compatible with evolving late acute to early subacute infarction. No new area of acute infarction, hemorrhage, or focal mass effect identified. Stable small chronic infarction in the right posterior temporal lobe, left hemi pons, left inferior cerebellum, and lacunar infarctions within bilateral anterior basal ganglia. Stable background of chronic microvascular ischemic changes and parenchymal volume loss of the brain. Vascular: Persistent contrast within the venous and arterial systems. Skull: Normal. Negative for fracture or focal lesion. Sinuses/Orbits: No acute finding. Other: None. IMPRESSION: 1. Left parietal region of acute infarction is stable in comparison with prior MRI given differences in technique. No new acute intracranial abnormality identified. 2. Stable background of chronic infarctions, microvascular ischemic changes, parenchymal volume loss of the brain. Electronically Signed   By: Mitzi Hansen M.D.   On: 07/08/2017 20:03   Ct Head Wo Contrast  Result Date: 07/06/2017 CLINICAL DATA:  Stroke follow-up EXAM: CT HEAD WITHOUT CONTRAST TECHNIQUE: Contiguous axial images were obtained from the base of the skull through the vertex without intravenous contrast. COMPARISON:  Brain MRI 07/06/2017 Head CT 07/06/2017 at 5:35 a.m. FINDINGS: Brain: Decreased density of material of the left basal ganglia, consistent with clearing of contrast staining. Right temporal encephalomalacia is unchanged. No acute hemorrhage or mass effect. No hydrocephalus. Vascular: Small amount of residual contrast material within the vessels. Skull: Normal visualized skull base, calvarium and extracranial soft tissues. Sinuses/Orbits: No sinus fluid levels or  advanced mucosal thickening. No mastoid effusion. Normal orbits. IMPRESSION: Decreased density of hyperdense material at the left basal ganglia, consistent with clearing of contrast staining. No new abnormality. Electronically Signed   By: Deatra Robinson M.D.   On: 07/06/2017 23:24   Ct Head Wo Contrast  Result Date: 07/06/2017 CLINICAL DATA:  Left MCA infarct status post intervention EXAM: CT HEAD WITHOUT CONTRAST TECHNIQUE: Contiguous axial images were obtained from the base of the skull through the vertex without intravenous contrast. COMPARISON:  Cerebral angiogram 07/05/2017 CTA head neck 07/05/2017 FINDINGS: Brain: There is hyperdensity within the left basal ganglia, likely indicating contrast staining. No midline shift or mass effect. No hydrocephalus. Unchanged right temporal lobe encephalomalacia. Vascular: There is intravascular contrast material related to prior an angiographic study. Skull: Normal visualized skull base, calvarium and extracranial soft tissues. Sinuses/Orbits: No sinus fluid levels or advanced mucosal thickening. No mastoid effusion. Normal orbits. IMPRESSION: Hyperdensity of the left basal ganglia is favored to represent contrast staining over petechial hemorrhage. Electronically Signed   By: Deatra Robinson M.D.   On: 07/06/2017  05:56   Ct Angio Neck W Or Wo Contrast  Result Date: 07/05/2017 CLINICAL DATA:  Right hemiparesis and difficulty speaking. EXAM: CT ANGIOGRAPHY HEAD AND NECK CT PERFUSION BRAIN TECHNIQUE: Multidetector CT imaging of the head and neck was performed using the standard protocol during bolus administration of intravenous contrast. Multiplanar CT image reconstructions and MIPs were obtained to evaluate the vascular anatomy. Carotid stenosis measurements (when applicable) are obtained utilizing NASCET criteria, using the distal internal carotid diameter as the denominator. Multiphase CT imaging of the brain was performed following IV bolus contrast injection.  Subsequent parametric perfusion maps were calculated using RAPID software. CONTRAST:  ISOVUE-370 IOPAMIDOL (ISOVUE-370) INJECTION 76% COMPARISON:  Head MRA 12/29/2013.  Neck MRA 06/30/2005 FINDINGS: CTA NECK FINDINGS Aortic arch: Standard 3 vessel aortic arch. Widely patent arch vessel origins. Right carotid system: Patent with predominantly soft plaque in the common carotid artery resulting in less than 50% narrowing. Minimal calcified plaque at the carotid bifurcation. No ICA stenosis. Left carotid system: Patent with predominantly soft plaque in the distal common carotid artery resulting in 50% stenosis. Moderate calcified plaque about the carotid bifurcation. No ICA stenosis. Vertebral arteries: Patent with the left being slightly dominant. Mild proximal right V2 stenosis due to calcified plaque. Calcified and soft plaque in the left V1 segment with mild multifocal stenoses. Skeleton: Mild cervical disc degeneration. Other neck: Multiple thyroid nodules including a 2.9 cm exophytic. Nodule extending inferiorly from the isthmus. Upper chest: Clear lung apices. Review of the MIP images confirms the above findings CTA HEAD FINDINGS Anterior circulation: The internal carotid arteries are patent from skull base to carotid termini with calcified plaque resulting in mild cavernous and supraclinoid stenosis on the right. The M1 segments are patent with mild stenosis on the right. The left M2 inferior division is occluded at its origin with some reconstitution of more distal M2 and M3 branches, however there are numerous missing distal branches in the left parietal region corresponding to the acute infarct. The prior MRA also suggested a severe stenosis or possibly short segment occlusion of the left M2 inferior division, however motion artifact on that study makes it difficult to make a detailed assessment of interval changes. There are also likely some missing distal left MCA superior division branch vessels.  There also severe proximal right M2 stenoses which are at least partially chronic. The right A1 segment is occluded. The left A1 segment is patent but demonstrates a severe proximal stenosis. Both A2 segments are heavily diseased with multifocal severe stenoses and or short segment occlusions. The appearance of the ACAs is grossly similar to the prior motion degraded MRA. No aneurysm. Posterior circulation: The intracranial vertebral arteries are patent to the basilar. There is right greater than left V4 segment calcified plaque without significant stenosis. Patent SCA origins are identified bilaterally. PICA and AICA origins are not well evaluated. The basilar artery is widely patent. Posterior communicating arteries are diminutive or absent. PCAs are patent with mild left P2 stenosis. No aneurysm. Venous sinuses: Inadequately assessed due to arterial phase contrast timing. Anatomic variants: None. Delayed phase: Not performed. Review of the MIP images confirms the above findings CT Brain Perfusion Findings: CBF (<30%) Volume: 28mL Perfusion (Tmax>6.0s) volume: , however a substantial amount of this is located in the right cerebral hemisphere Mismatch Volume: though with the caveat that this is artifactually inflated due to (likely chronic) prolonged perfusion in the right cerebral hemisphere. There is still however an apparent sizable penumbra in the posterior left  cerebral hemisphere. Infarction Location:Predominantly left parietal lobe with a small amount of posterior left frontal lobe, posterior left temporal lobe, and posterior insula involvement. IMPRESSION: 1. 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 2. Left M2 inferior division occlusion at its origin with some distal reconstitution. Severe stenosis (or possibly short segment occlusion) in this location on a prior MRA. Numerous missing distal branch vessels in the left parietal region corresponding to the acute infarct. 3.  Advanced intracranial atherosclerosis with numerous severe stenoses as above. 4. 50% left common carotid artery stenosis. 5.  Aortic Atherosclerosis (ICD10-I70.0). 6. Thyroid nodules measuring up to 2.9 cm. Nonemergent thyroid ultrasound recommended for further evaluation. These results were communicated to Dr. Amada Jupiter at 10:18 pm on 07/05/2017 by text page via the [facility] messaging system. Electronically Signed   By: Sebastian Ache M.D.   On: 07/05/2017 22:47   Mr Maxine Glenn Head Wo Contrast  Result Date: 07/06/2017 CLINICAL DATA:  Stroke follow-up EXAM: MRI HEAD WITHOUT CONTRAST MRA HEAD WITHOUT CONTRAST TECHNIQUE: Multiplanar, multiecho pulse sequences of the brain and surrounding structures were obtained without intravenous contrast. Angiographic images of the head were obtained using MRA technique without contrast. COMPARISON:  CT, CTA, and CTP 07/05/2017.  Brain MRI 04/27/2016 FINDINGS: MRI HEAD FINDINGS Brain: Cortically based restricted diffusion in the posterior left insula, superior temporal lobe, parietal lobe, and minimally involving the posterior left frontal lobe. There is good correlation with infarct by CT perfusion. Small remote infarcts in the left superior cerebellum, left paramedian pons, left basal ganglia/internal capsule, and right temporal parietal cortex. There is chronic blood products at the remote left basal ganglia infarct. No hemorrhage seen at the acute infarct. No hydrocephalus or masslike finding. Vascular: Arterial findings below. Normal dural venous sinus flow voids. Skull and upper cervical spine: Negative Sinuses/Orbits: Negative MRA HEAD FINDINGS Symmetric carotid and vertebral arteries. The vertebral and basilar arteries are smooth and diffusely patent. Atheromatous irregularity of bilateral posterior cerebral arteries with high-grade left P2 segment narrowing. Essentially nonvisualized bilateral ACA attributed to advanced atheromatous narrowing based on prior CTA. Less robust flow  signal seen in the left MCA with continued nonvisualization of the inferior division. The left superior division and right MCA vasculature show diffuse atheromatous irregularity. IMPRESSION: 1. Acute left MCA territory cortical infarct without detected progression when compared to CTP from yesterday. No hemorrhagic conversion. 2. Continued non visualization of the left MCA inferior division. 3. There is advanced intracranial atherosclerosis with nonvisualized ACAs and severe left P2 segment stenosis. 4. Remote small vessel infarcts as described. Electronically Signed   By: Marnee Spring M.D.   On: 07/06/2017 12:59   Mr Brain Wo Contrast  Result Date: 07/06/2017 CLINICAL DATA:  Stroke follow-up EXAM: MRI HEAD WITHOUT CONTRAST MRA HEAD WITHOUT CONTRAST TECHNIQUE: Multiplanar, multiecho pulse sequences of the brain and surrounding structures were obtained without intravenous contrast. Angiographic images of the head were obtained using MRA technique without contrast. COMPARISON:  CT, CTA, and CTP 07/05/2017.  Brain MRI 04/27/2016 FINDINGS: MRI HEAD FINDINGS Brain: Cortically based restricted diffusion in the posterior left insula, superior temporal lobe, parietal lobe, and minimally involving the posterior left frontal lobe. There is good correlation with infarct by CT perfusion. Small remote infarcts in the left superior cerebellum, left paramedian pons, left basal ganglia/internal capsule, and right temporal parietal cortex. There is chronic blood products at the remote left basal ganglia infarct. No hemorrhage seen at the acute infarct. No hydrocephalus or masslike finding. Vascular: Arterial findings below. Normal dural venous sinus  flow voids. Skull and upper cervical spine: Negative Sinuses/Orbits: Negative MRA HEAD FINDINGS Symmetric carotid and vertebral arteries. The vertebral and basilar arteries are smooth and diffusely patent. Atheromatous irregularity of bilateral posterior cerebral arteries with  high-grade left P2 segment narrowing. Essentially nonvisualized bilateral ACA attributed to advanced atheromatous narrowing based on prior CTA. Less robust flow signal seen in the left MCA with continued nonvisualization of the inferior division. The left superior division and right MCA vasculature show diffuse atheromatous irregularity. IMPRESSION: 1. Acute left MCA territory cortical infarct without detected progression when compared to CTP from yesterday. No hemorrhagic conversion. 2. Continued non visualization of the left MCA inferior division. 3. There is advanced intracranial atherosclerosis with nonvisualized ACAs and severe left P2 segment stenosis. 4. Remote small vessel infarcts as described. Electronically Signed   By: Marnee Spring M.D.   On: 07/06/2017 12:59   Ir Ct Head Ltd  Result Date: 07/07/2017 INDICATION: Flaccid right and arm leg. Left gaze deviation. Dysarthria. Occluded inferior division of the left middle cerebral artery. EXAM: 1. EMERGENT LARGE VESSEL OCCLUSION THROMBOLYSIS (anterior CIRCULATION) COMPARISON:  CT angiogram of the head and neck of 07/05/2017. MEDICATIONS: Ancef 2 g IV antibiotic was administered within 1 hour of the procedure. ANESTHESIA/SEDATION: General anesthesia. CONTRAST:  Isovue 300 approximately 70 cc. FLUOROSCOPY TIME:  Fluoroscopy Time: 27 minutes 12 seconds (2951 mGy). COMPLICATIONS: None immediate. TECHNIQUE: Following a full explanation of the procedure along with the potential associated complications, an informed witnessed consent was obtained. The risks of intracranial hemorrhage of 10%, worsening neurological deficit, ventilator dependency, death and inability to revascularize were all reviewed in detail with the patient's family. The patient was then put under general anesthesia by the Department of Anesthesiology at [facility]. The right groin was prepped and draped in the usual sterile fashion. Thereafter using modified Seldinger technique,  transfemoral access into the right common femoral artery was obtained without difficulty. Over a 0.035 inch guidewire a 5 French Pinnacle sheath was inserted. Through this, and also over a 0.035 inch guidewire a 5 [place] JB 1 catheter was advanced to the aortic arch region and selectively positioned in the right common carotid artery, the right vertebral artery, the left vertebral artery and the left common carotid artery. FINDINGS: The left common carotid arteriogram demonstrates approximately 50% stenosis of the mid segment of the left common carotid artery. The left external carotid artery and its major branches are widely patent. The left internal carotid artery at the bulb demonstrates wide patency. More distally, there is mild decreased caliber of the left internal carotid artery in its entirety. The petrous, cavernous and supraclinoid segments demonstrate wide patency. The left anterior cerebral artery demonstrates complete occlusion distal to the left A1 segment. This is associated with caliber irregularity in the A1 segment. The left middle cerebral artery M1 segment proximally demonstrates mild caliber irregularity. There is patency of the superior division of the left middle cerebral artery with delayed reconstitution of the distal inferior division of the left middle cerebral artery in the M2 M3 regions. Distal to this the M3 branches and the M4 branches demonstrated gradual ascent of contrast. The inferior division appears to be completely occluded associated with a stump. A diminutive anterior temporal branch is noted. A prominent anterior choroidal artery on the left is noted with delayed arterial blush noted in the region of the splenium of the corpus callosum. The whole head delayed arterial phase in the lateral projection demonstrates a prominent area of hypoperfusion involving the left  parietal cortical and subcortical regions. The left vertebral artery origin is from the thyrocervical trunk. The  vessel is seen to opacify to the cranial skull base. Wide patency is seen of the left vertebrobasilar junction and the left posterior inferior cerebellar artery. The basilar artery, the posterior cerebral arteries, the superior cerebellar arteries and the anterior-inferior cerebellar arteries opacify into the capillary and venous phases. Prominent collaterals are seen in the P3 region of the left posterior cerebral artery retrogradely filling the posterior 1/3 of the left parietal cortical and subcortical areas. The delayed arterial phase then demonstrates retrograde opacification of the distal pericallosal artery in the splenium of the corpus callosum from the lateral thalamo perforator branches. Also demonstrated is retrograde opacification of the posterior half of the anterior temporal region from the posterior temporal branches of the P1 P2 region of the left posterior cerebral artery. The right common carotid arteriogram demonstrates mild narrowing in the mid third of the right common carotid artery. The right external carotid artery and its major branches are widely patent. The right internal carotid artery at the bulb to the cranial skull base is seen to opacify unimpeded. The petrous segment is normal. There is mild narrowing of the caval cavernous segment of the right internal carotid artery. Distal to this the supraclinoid segment demonstrates mild decrease in caliber. The right middle cerebral artery demonstrates a 50% stenosis in the distal right M1 segment. The proximal superior and the inferior divisions demonstrate moderate stenosis. Again demonstrated is a prominent anterior choroidal artery on the right side. The delayed arterial phase demonstrates hypoperfusion of the parietal cortical and subcortical regions. The right vertebral artery origin is from the right thyrocervical trunk. Again this vessel is seen to opacify to the cranial skull base. Wide patency is seen of the right vertebrobasilar  junction and the right posterior-inferior cerebellar artery. Multiple focal areas of caliber irregularity are seen involving the posterior-inferior cerebellar artery. The opacified portion of the basilar artery, the proximal posterior cerebral arteries, the superior cerebellar arteries and the anterior-inferior cerebellar arteries are seen to opacify into the capillary and venous phases. Extensive collaterals are again seen from the P3 regions of both posterior cerebral arteries contributing to the posterior parietal middle cerebral artery distributions bilaterally as described earlier. PROCEDURE: ENDOVASCULAR TREATMENT OF OCCLUDED DOMINANT INFERIOR DIVISION OF THE LEFT MIDDLE CEREBRAL ARTERY The diagnostic JB 1 catheter in the left common carotid artery was exchanged over a 0.035 inch 8 French 55 cm Brite tip neurovascular sheath using biplane roadmap technique and constant fluoroscopic guidance. Good aspiration obtained from the hub of the 8 [place] Brite tip neurovascular sheath. A gentle contrast injection demonstrated no evidence of spasms, dissections or of intraluminal filling defects. This was then connected to continuous heparinized saline infusion. Over the [facility] exchange guidewire, an 85 cm 8 [place] of balloon FlowGate guide catheter which had been prepped with 50% contrast and 50% heparinized saline infusion was advanced and positioned just proximal to the left common carotid bifurcation. The guidewire was removed. Good aspiration was obtained at the hub of the [facility] guide catheter. Over a 0.035 inch Roadrunner guidewire, using biplane roadmap technique and constant fluoroscopic guidance, the FlowGate guide catheter was then advanced to the mid cervical left ICA. The guidewire was removed. Again good aspiration was obtained from the hub of the 8 [place] FlowGate guide catheter. A gentle control arteriogram performed centered extracranially and intracranially demonstrated no change in the intracranial  circulation. Over a 0.014 inch Softip Synchro micro guidewire, a  Trevo ProVue 021 microcatheter was advanced to the distal end of the of FlowGate guide catheter. With the micro guidewire leading with a J-tip configuration, the combination was navigated to the supraclinoid left ICA. The micro guidewire was then advanced gently into the inferior division origin followed by the microcatheter. A gentle manipulation of the micro guidewire was then performed using a torque device to encourage advancement through the occluded left middle cerebral artery. The microcatheter was then brought into close proximity to the site of the occlusion. Further manipulation was performed with partial advancement of the micro guidewire through the occluded left middle cerebral artery inferior division. To avoid potential perforation of the more distal inferior division the micro guidewire was retrieved. Good aspiration obtained from the hub of the microcatheter. Approximately 3.6 mg of super selective intracranial intra-arterial Integrilin was infused over a couple of minutes. Thereafter, further attempts were made to encourage advancement of a very soft microguidewire again with only partial success. 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 The procedure was then stopped. The final control arteriogram performed through the [facility] guide catheter in the left internal carotid artery continued to demonstrate no significant change in the intracranial circulation. No angiographic evidence of extravasation or mass-effect or midline shift was seen. A Dyna CT of the head performed revealed no evidence of hemorrhage or change in the ventricular size or mass effect or midline shift. The [facility] guide catheter and the neurovascular sheath were then exchanged over a J-tip 0.035 guidewire for an 8 [place] Pinnacle sheath. This was then removed with successful hemostasis being achieved by manual compression. At the end of the  procedure, the patient's right groin appeared soft without evidence of bleeding or hematoma. Distal pulses continued to be palpable in the dorsalis pedis, and the posterior tibial regions unchanged from prior to the procedure. The patient's general anesthesia was then reversed. The patient was extubated without difficulty. Upon recovery the patient was noted to be able to raise his right arm, and also bend his right knee. He responded appropriately to simple commands. He was then transferred to the PACU and then to neuro ICU for further management. IMPRESSION: Status post attempted revascularization of occluded dominant inferior division of the right middle cerebral artery using 3.6 mg of super selective intracranial intra-arterial Integrilin and mechanical thrombolysis with a micro guidewire. PLAN: Further management as per stroke team. Electronically Signed   By: Julieanne Cotton M.D.   On: 07/06/2017 10:05   Ct Cerebral Perfusion W Contrast  Result Date: 07/05/2017 CLINICAL DATA:  Right hemiparesis and difficulty speaking. EXAM: CT ANGIOGRAPHY HEAD AND NECK CT PERFUSION BRAIN TECHNIQUE: Multidetector CT imaging of the head and neck was performed using the standard protocol during bolus administration of intravenous contrast. Multiplanar CT image reconstructions and MIPs were obtained to evaluate the vascular anatomy. Carotid stenosis measurements (when applicable) are obtained utilizing NASCET criteria, using the distal internal carotid diameter as the denominator. Multiphase CT imaging of the brain was performed following IV bolus contrast injection. Subsequent parametric perfusion maps were calculated using RAPID software. CONTRAST:  ISOVUE-370 IOPAMIDOL (ISOVUE-370) INJECTION 76% COMPARISON:  Head MRA 12/29/2013.  Neck MRA 06/30/2005 FINDINGS: CTA NECK FINDINGS Aortic arch: Standard 3 vessel aortic arch. Widely patent arch vessel origins. Right carotid system: Patent with predominantly soft plaque  in the common carotid artery resulting in less than 50% narrowing. Minimal calcified plaque at the carotid bifurcation. No ICA stenosis. Left carotid system: Patent with predominantly soft plaque in the  distal common carotid artery resulting in 50% stenosis. Moderate calcified plaque about the carotid bifurcation. No ICA stenosis. Vertebral arteries: Patent with the left being slightly dominant. Mild proximal right V2 stenosis due to calcified plaque. Calcified and soft plaque in the left V1 segment with mild multifocal stenoses. Skeleton: Mild cervical disc degeneration. Other neck: Multiple thyroid nodules including a 2.9 cm exophytic. Nodule extending inferiorly from the isthmus. Upper chest: Clear lung apices. Review of the MIP images confirms the above findings CTA HEAD FINDINGS Anterior circulation: The internal carotid arteries are patent from skull base to carotid termini with calcified plaque resulting in mild cavernous and supraclinoid stenosis on the right. The M1 segments are patent with mild stenosis on the right. The left M2 inferior division is occluded at its origin with some reconstitution of more distal M2 and M3 branches, however there are numerous missing distal branches in the left parietal region corresponding to the acute infarct. The prior MRA also suggested a severe stenosis or possibly short segment occlusion of the left M2 inferior division, however motion artifact on that study makes it difficult to make a detailed assessment of interval changes. There are also likely some missing distal left MCA superior division branch vessels. There also severe proximal right M2 stenoses which are at least partially chronic. The right A1 segment is occluded. The left A1 segment is patent but demonstrates a severe proximal stenosis. Both A2 segments are heavily diseased with multifocal severe stenoses and or short segment occlusions. The appearance of the ACAs is grossly similar to the prior motion  degraded MRA. No aneurysm. Posterior circulation: The intracranial vertebral arteries are patent to the basilar. There is right greater than left V4 segment calcified plaque without significant stenosis. Patent SCA origins are identified bilaterally. PICA and AICA origins are not well evaluated. The basilar artery is widely patent. Posterior communicating arteries are diminutive or absent. PCAs are patent with mild left P2 stenosis. No aneurysm. Venous sinuses: Inadequately assessed due to arterial phase contrast timing. Anatomic variants: None. Delayed phase: Not performed. Review of the MIP images confirms the above findings CT Brain Perfusion Findings: CBF (<30%) Volume: 28mL Perfusion (Tmax>6.0s) volume: , however a substantial amount of this is located in the right cerebral hemisphere Mismatch Volume: though with the caveat that this is artifactually inflated due to (likely chronic) prolonged perfusion in the right cerebral hemisphere. There is still however an apparent sizable penumbra in the posterior left cerebral hemisphere. Infarction Location:Predominantly left parietal lobe with a small amount of posterior left frontal lobe, posterior left temporal lobe, and posterior insula involvement. IMPRESSION: 1. 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 2. Left M2 inferior division occlusion at its origin with some distal reconstitution. Severe stenosis (or possibly short segment occlusion) in this location on a prior MRA. Numerous missing distal branch vessels in the left parietal region corresponding to the acute infarct. 3. Advanced intracranial atherosclerosis with numerous severe stenoses as above. 4. 50% left common carotid artery stenosis. 5.  Aortic Atherosclerosis (ICD10-I70.0). 6. Thyroid nodules measuring up to 2.9 cm. Nonemergent thyroid ultrasound recommended for further evaluation. These results were communicated to Dr. Amada Jupiter at 10:18 pm on 07/05/2017 by text page  via the [facility] messaging system. Electronically Signed   By: Sebastian Ache M.D.   On: 07/05/2017 22:47   Dg Chest Port 1 View  Result Date: 07/06/2017 CLINICAL DATA:  Cardiomyopathy, coronary artery disease with stent placement, chronic renal insufficiency. EXAM: PORTABLE CHEST 1 VIEW COMPARISON:  Chest x-ray of March 04, 2017 FINDINGS: The lungs are borderline hypoinflated but clear. The heart is top-normal in size. The pulmonary vascularity is normal. There is no pleural effusion. There are post CABG changes. There is a vascular graft in the left axillary region. The bony thorax exhibits no acute abnormality. IMPRESSION: Top-normal cardiac size without pulmonary edema or pleural effusion. No pneumonia. Electronically Signed   By: David  [place] M.D.   On: 07/06/2017 07:57   Ir Percutaneous Art Thrombectomy/infusion Intracranial Inc Diag Angio  Result Date: 07/07/2017 INDICATION: Flaccid right and arm leg. Left gaze deviation. Dysarthria. Occluded inferior division of the left middle cerebral artery. EXAM: 1. EMERGENT LARGE VESSEL OCCLUSION THROMBOLYSIS (anterior CIRCULATION) COMPARISON:  CT angiogram of the head and neck of 07/05/2017. MEDICATIONS: Ancef 2 g IV antibiotic was administered within 1 hour of the procedure. ANESTHESIA/SEDATION: General anesthesia. CONTRAST:  Isovue 300 approximately 70 cc. FLUOROSCOPY TIME:  Fluoroscopy Time: 27 minutes 12 seconds (2951 mGy). COMPLICATIONS: None immediate. TECHNIQUE: Following a full explanation of the procedure along with the potential associated complications, an informed witnessed consent was obtained. The risks of intracranial hemorrhage of 10%, worsening neurological deficit, ventilator dependency, death and inability to revascularize were all reviewed in detail with the patient's family. The patient was then put under general anesthesia by the Department of Anesthesiology at [facility]. The right groin was prepped and draped in the usual sterile  fashion. Thereafter using modified Seldinger technique, transfemoral access into the right common femoral artery was obtained without difficulty. Over a 0.035 inch guidewire a 5 French Pinnacle sheath was inserted. Through this, and also over a 0.035 inch guidewire a 5 [place] JB 1 catheter was advanced to the aortic arch region and selectively positioned in the right common carotid artery, the right vertebral artery, the left vertebral artery and the left common carotid artery. FINDINGS: The left common carotid arteriogram demonstrates approximately 50% stenosis of the mid segment of the left common carotid artery. The left external carotid artery and its major branches are widely patent. The left internal carotid artery at the bulb demonstrates wide patency. More distally, there is mild decreased caliber of the left internal carotid artery in its entirety. The petrous, cavernous and supraclinoid segments demonstrate wide patency. The left anterior cerebral artery demonstrates complete occlusion distal to the left A1 segment. This is associated with caliber irregularity in the A1 segment. The left middle cerebral artery M1 segment proximally demonstrates mild caliber irregularity. There is patency of the superior division of the left middle cerebral artery with delayed reconstitution of the distal inferior division of the left middle cerebral artery in the M2 M3 regions. Distal to this the M3 branches and the M4 branches demonstrated gradual ascent of contrast. The inferior division appears to be completely occluded associated with a stump. A diminutive anterior temporal branch is noted. A prominent anterior choroidal artery on the left is noted with delayed arterial blush noted in the region of the splenium of the corpus callosum. The whole head delayed arterial phase in the lateral projection demonstrates a prominent area of hypoperfusion involving the left parietal cortical and subcortical regions. The left  vertebral artery origin is from the thyrocervical trunk. The vessel is seen to opacify to the cranial skull base. Wide patency is seen of the left vertebrobasilar junction and the left posterior inferior cerebellar artery. The basilar artery, the posterior cerebral arteries, the superior cerebellar arteries and the anterior-inferior cerebellar arteries opacify into the capillary and venous phases. Prominent  collaterals are seen in the P3 region of the left posterior cerebral artery retrogradely filling the posterior 1/3 of the left parietal cortical and subcortical areas. The delayed arterial phase then demonstrates retrograde opacification of the distal pericallosal artery in the splenium of the corpus callosum from the lateral thalamo perforator branches. Also demonstrated is retrograde opacification of the posterior half of the anterior temporal region from the posterior temporal branches of the P1 P2 region of the left posterior cerebral artery. The right common carotid arteriogram demonstrates mild narrowing in the mid third of the right common carotid artery. The right external carotid artery and its major branches are widely patent. The right internal carotid artery at the bulb to the cranial skull base is seen to opacify unimpeded. The petrous segment is normal. There is mild narrowing of the caval cavernous segment of the right internal carotid artery. Distal to this the supraclinoid segment demonstrates mild decrease in caliber. The right middle cerebral artery demonstrates a 50% stenosis in the distal right M1 segment. The proximal superior and the inferior divisions demonstrate moderate stenosis. Again demonstrated is a prominent anterior choroidal artery on the right side. The delayed arterial phase demonstrates hypoperfusion of the parietal cortical and subcortical regions. The right vertebral artery origin is from the right thyrocervical trunk. Again this vessel is seen to opacify to the cranial skull  base. Wide patency is seen of the right vertebrobasilar junction and the right posterior-inferior cerebellar artery. Multiple focal areas of caliber irregularity are seen involving the posterior-inferior cerebellar artery. The opacified portion of the basilar artery, the proximal posterior cerebral arteries, the superior cerebellar arteries and the anterior-inferior cerebellar arteries are seen to opacify into the capillary and venous phases. Extensive collaterals are again seen from the P3 regions of both posterior cerebral arteries contributing to the posterior parietal middle cerebral artery distributions bilaterally as described earlier. PROCEDURE: ENDOVASCULAR TREATMENT OF OCCLUDED DOMINANT INFERIOR DIVISION OF THE LEFT MIDDLE CEREBRAL ARTERY The diagnostic JB 1 catheter in the left common carotid artery was exchanged over a 0.035 inch 8 French 55 cm Brite tip neurovascular sheath using biplane roadmap technique and constant fluoroscopic guidance. Good aspiration obtained from the hub of the 8 [place] Brite tip neurovascular sheath. A gentle contrast injection demonstrated no evidence of spasms, dissections or of intraluminal filling defects. This was then connected to continuous heparinized saline infusion. Over the [facility] exchange guidewire, an 85 cm 8 [place] of balloon FlowGate guide catheter which had been prepped with 50% contrast and 50% heparinized saline infusion was advanced and positioned just proximal to the left common carotid bifurcation. The guidewire was removed. Good aspiration was obtained at the hub of the [facility] guide catheter. Over a 0.035 inch Roadrunner guidewire, using biplane roadmap technique and constant fluoroscopic guidance, the FlowGate guide catheter was then advanced to the mid cervical left ICA. The guidewire was removed. Again good aspiration was obtained from the hub of the 8 [place] FlowGate guide catheter. A gentle control arteriogram performed centered extracranially and  intracranially demonstrated no change in the intracranial circulation. Over a 0.014 inch Softip Synchro micro guidewire, a Trevo ProVue 021 microcatheter was advanced to the distal end of the of FlowGate guide catheter. With the micro guidewire leading with a J-tip configuration, the combination was navigated to the supraclinoid left ICA. The micro guidewire was then advanced gently into the inferior division origin followed by the microcatheter. A gentle manipulation of the micro guidewire was then performed using a torque device to encourage advancement  through the occluded left middle cerebral artery. The microcatheter was then brought into close proximity to the site of the occlusion. Further manipulation was performed with partial advancement of the micro guidewire through the occluded left middle cerebral artery inferior division. To avoid potential perforation of the more distal inferior division the micro guidewire was retrieved. Good aspiration obtained from the hub of the microcatheter. Approximately 3.6 mg of super selective intracranial intra-arterial Integrilin was infused over a couple of minutes. Thereafter, further attempts were made to encourage advancement of a very soft microguidewire again with only partial success. 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 The procedure was then stopped. The final control arteriogram performed through the [facility] guide catheter in the left internal carotid artery continued to demonstrate no significant change in the intracranial circulation. No angiographic evidence of extravasation or mass-effect or midline shift was seen. A Dyna CT of the head performed revealed no evidence of hemorrhage or change in the ventricular size or mass effect or midline shift. The [facility] guide catheter and the neurovascular sheath were then exchanged over a J-tip 0.035 guidewire for an 8 [place] Pinnacle sheath. This was then removed with successful hemostasis  being achieved by manual compression. At the end of the procedure, the patient's right groin appeared soft without evidence of bleeding or hematoma. Distal pulses continued to be palpable in the dorsalis pedis, and the posterior tibial regions unchanged from prior to the procedure. The patient's general anesthesia was then reversed. The patient was extubated without difficulty. Upon recovery the patient was noted to be able to raise his right arm, and also bend his right knee. He responded appropriately to simple commands. He was then transferred to the PACU and then to neuro ICU for further management. IMPRESSION: Status post attempted revascularization of occluded dominant inferior division of the right middle cerebral artery using 3.6 mg of super selective intracranial intra-arterial Integrilin and mechanical thrombolysis with a micro guidewire. PLAN: Further management as per stroke team. Electronically Signed   By: Julieanne Cotton M.D.   On: 07/06/2017 10:05   Ct Head Code Stroke Wo Contrast  Result Date: 07/05/2017 CLINICAL DATA:  Code stroke. Right hemiparesis and difficulty speaking. EXAM: CT HEAD WITHOUT CONTRAST TECHNIQUE: Contiguous axial images were obtained from the base of the skull through the vertex without intravenous contrast. COMPARISON:  Brain MRI 04/27/2016 FINDINGS: Brain: There is loss of gray-white differentiation consistent with acute infarction involving the posterior left insula and left parietal lobe extending towards the vertex. No acute intracranial hemorrhage, mass, midline shift, or extra-axial fluid collection is identified. Chronic lacunar infarcts are present in the basal ganglia and thalami bilaterally. There is a chronic right temporoparietal infarct. There is a small chronic left cerebellar infarct. Patchy cerebral white matter hypodensities are nonspecific but compatible with chronic small vessel ischemic disease, moderately advanced for age. Mild cerebral atrophy is  advanced for age. Vascular: Calcified atherosclerosis at the skull base. No hyperdense vessel. Skull: No fracture or focal osseous lesion. Sinuses/Orbits: Visualized paranasal sinuses and mastoid air cells are clear. Orbits are unremarkable. Other: None. ASPECTS [facility] Stroke Program Early CT Score) - Ganglionic level infarction (caudate, lentiform nuclei, internal capsule, insula, M1-M3 cortex): 5-6 - Supraganglionic infarction (M4-M6 cortex): 2 Total score (0-10 with 10 being normal): 7-8 IMPRESSION: 1. Acute left MCA infarct involving the insula and parietal lobe. No hemorrhage. 2. ASPECTS is 7-8. 3. Age advanced chronic ischemia. These results were communicated to Dr. Amada Jupiter at 9:54 pm on 07/05/2017  by text page via the [facility] messaging system. Electronically Signed   By: Sebastian Ache M.D.   On: 07/05/2017 21:59   Ir Angio Intra Extracran Sel Com Carotid Innominate Uni R Mod Sed  Result Date: 07/07/2017 INDICATION: Flaccid right and arm leg. Left gaze deviation. Dysarthria. Occluded inferior division of the left middle cerebral artery. EXAM: 1. EMERGENT LARGE VESSEL OCCLUSION THROMBOLYSIS (anterior CIRCULATION) COMPARISON:  CT angiogram of the head and neck of 07/05/2017. MEDICATIONS: Ancef 2 g IV antibiotic was administered within 1 hour of the procedure. ANESTHESIA/SEDATION: General anesthesia. CONTRAST:  Isovue 300 approximately 70 cc. FLUOROSCOPY TIME:  Fluoroscopy Time: 27 minutes 12 seconds (2951 mGy). COMPLICATIONS: None immediate. TECHNIQUE: Following a full explanation of the procedure along with the potential associated complications, an informed witnessed consent was obtained. The risks of intracranial hemorrhage of 10%, worsening neurological deficit, ventilator dependency, death and inability to revascularize were all reviewed in detail with the patient's family. The patient was then put under general anesthesia by the Department of Anesthesiology at [facility]. The right groin was  prepped and draped in the usual sterile fashion. Thereafter using modified Seldinger technique, transfemoral access into the right common femoral artery was obtained without difficulty. Over a 0.035 inch guidewire a 5 French Pinnacle sheath was inserted. Through this, and also over a 0.035 inch guidewire a 5 [place] JB 1 catheter was advanced to the aortic arch region and selectively positioned in the right common carotid artery, the right vertebral artery, the left vertebral artery and the left common carotid artery. FINDINGS: The left common carotid arteriogram demonstrates approximately 50% stenosis of the mid segment of the left common carotid artery. The left external carotid artery and its major branches are widely patent. The left internal carotid artery at the bulb demonstrates wide patency. More distally, there is mild decreased caliber of the left internal carotid artery in its entirety. The petrous, cavernous and supraclinoid segments demonstrate wide patency. The left anterior cerebral artery demonstrates complete occlusion distal to the left A1 segment. This is associated with caliber irregularity in the A1 segment. The left middle cerebral artery M1 segment proximally demonstrates mild caliber irregularity. There is patency of the superior division of the left middle cerebral artery with delayed reconstitution of the distal inferior division of the left middle cerebral artery in the M2 M3 regions. Distal to this the M3 branches and the M4 branches demonstrated gradual ascent of contrast. The inferior division appears to be completely occluded associated with a stump. A diminutive anterior temporal branch is noted. A prominent anterior choroidal artery on the left is noted with delayed arterial blush noted in the region of the splenium of the corpus callosum. The whole head delayed arterial phase in the lateral projection demonstrates a prominent area of hypoperfusion involving the left parietal cortical  and subcortical regions. The left vertebral artery origin is from the thyrocervical trunk. The vessel is seen to opacify to the cranial skull base. Wide patency is seen of the left vertebrobasilar junction and the left posterior inferior cerebellar artery. The basilar artery, the posterior cerebral arteries, the superior cerebellar arteries and the anterior-inferior cerebellar arteries opacify into the capillary and venous phases. Prominent collaterals are seen in the P3 region of the left posterior cerebral artery retrogradely filling the posterior 1/3 of the left parietal cortical and subcortical areas. The delayed arterial phase then demonstrates retrograde opacification of the distal pericallosal artery in the splenium of the corpus callosum from the lateral thalamo perforator branches.  Also demonstrated is retrograde opacification of the posterior half of the anterior temporal region from the posterior temporal branches of the P1 P2 region of the left posterior cerebral artery. The right common carotid arteriogram demonstrates mild narrowing in the mid third of the right common carotid artery. The right external carotid artery and its major branches are widely patent. The right internal carotid artery at the bulb to the cranial skull base is seen to opacify unimpeded. The petrous segment is normal. There is mild narrowing of the caval cavernous segment of the right internal carotid artery. Distal to this the supraclinoid segment demonstrates mild decrease in caliber. The right middle cerebral artery demonstrates a 50% stenosis in the distal right M1 segment. The proximal superior and the inferior divisions demonstrate moderate stenosis. Again demonstrated is a prominent anterior choroidal artery on the right side. The delayed arterial phase demonstrates hypoperfusion of the parietal cortical and subcortical regions. The right vertebral artery origin is from the right thyrocervical trunk. Again this vessel is  seen to opacify to the cranial skull base. Wide patency is seen of the right vertebrobasilar junction and the right posterior-inferior cerebellar artery. Multiple focal areas of caliber irregularity are seen involving the posterior-inferior cerebellar artery. The opacified portion of the basilar artery, the proximal posterior cerebral arteries, the superior cerebellar arteries and the anterior-inferior cerebellar arteries are seen to opacify into the capillary and venous phases. Extensive collaterals are again seen from the P3 regions of both posterior cerebral arteries contributing to the posterior parietal middle cerebral artery distributions bilaterally as described earlier. PROCEDURE: ENDOVASCULAR TREATMENT OF OCCLUDED DOMINANT INFERIOR DIVISION OF THE LEFT MIDDLE CEREBRAL ARTERY The diagnostic JB 1 catheter in the left common carotid artery was exchanged over a 0.035 inch 8 French 55 cm Brite tip neurovascular sheath using biplane roadmap technique and constant fluoroscopic guidance. Good aspiration obtained from the hub of the 8 [place] Brite tip neurovascular sheath. A gentle contrast injection demonstrated no evidence of spasms, dissections or of intraluminal filling defects. This was then connected to continuous heparinized saline infusion. Over the [facility] exchange guidewire, an 85 cm 8 [place] of balloon FlowGate guide catheter which had been prepped with 50% contrast and 50% heparinized saline infusion was advanced and positioned just proximal to the left common carotid bifurcation. The guidewire was removed. Good aspiration was obtained at the hub of the [facility] guide catheter. Over a 0.035 inch Roadrunner guidewire, using biplane roadmap technique and constant fluoroscopic guidance, the FlowGate guide catheter was then advanced to the mid cervical left ICA. The guidewire was removed. Again good aspiration was obtained from the hub of the 8 [place] FlowGate guide catheter. A gentle control arteriogram  performed centered extracranially and intracranially demonstrated no change in the intracranial circulation. Over a 0.014 inch Softip Synchro micro guidewire, a Trevo ProVue 021 microcatheter was advanced to the distal end of the of FlowGate guide catheter. With the micro guidewire leading with a J-tip configuration, the combination was navigated to the supraclinoid left ICA. The micro guidewire was then advanced gently into the inferior division origin followed by the microcatheter. A gentle manipulation of the micro guidewire was then performed using a torque device to encourage advancement through the occluded left middle cerebral artery. The microcatheter was then brought into close proximity to the site of the occlusion. Further manipulation was performed with partial advancement of the micro guidewire through the occluded left middle cerebral artery inferior division. To avoid potential perforation of the more distal inferior division the  micro guidewire was retrieved. Good aspiration obtained from the hub of the microcatheter. Approximately 3.6 mg of super selective intracranial intra-arterial Integrilin was infused over a couple of minutes. Thereafter, further attempts were made to encourage advancement of a very soft microguidewire again with only partial success. 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 The procedure was then stopped. The final control arteriogram performed through the [facility] guide catheter in the left internal carotid artery continued to demonstrate no significant change in the intracranial circulation. No angiographic evidence of extravasation or mass-effect or midline shift was seen. A Dyna CT of the head performed revealed no evidence of hemorrhage or change in the ventricular size or mass effect or midline shift. The [facility] guide catheter and the neurovascular sheath were then exchanged over a J-tip 0.035 guidewire for an 8 [place] Pinnacle sheath. This was then  removed with successful hemostasis being achieved by manual compression. At the end of the procedure, the patient's right groin appeared soft without evidence of bleeding or hematoma. Distal pulses continued to be palpable in the dorsalis pedis, and the posterior tibial regions unchanged from prior to the procedure. The patient's general anesthesia was then reversed. The patient was extubated without difficulty. Upon recovery the patient was noted to be able to raise his right arm, and also bend his right knee. He responded appropriately to simple commands. He was then transferred to the PACU and then to neuro ICU for further management. IMPRESSION: Status post attempted revascularization of occluded dominant inferior division of the right middle cerebral artery using 3.6 mg of super selective intracranial intra-arterial Integrilin and mechanical thrombolysis with a micro guidewire. PLAN: Further management as per stroke team. Electronically Signed   By: Julieanne Cotton M.D.   On: 07/06/2017 10:05   Ir Angio Vertebral Sel Vertebral Bilat Mod Sed  Result Date: 07/07/2017 INDICATION: Flaccid right and arm leg. Left gaze deviation. Dysarthria. Occluded inferior division of the left middle cerebral artery. EXAM: 1. EMERGENT LARGE VESSEL OCCLUSION THROMBOLYSIS (anterior CIRCULATION) COMPARISON:  CT angiogram of the head and neck of 07/05/2017. MEDICATIONS: Ancef 2 g IV antibiotic was administered within 1 hour of the procedure. ANESTHESIA/SEDATION: General anesthesia. CONTRAST:  Isovue 300 approximately 70 cc. FLUOROSCOPY TIME:  Fluoroscopy Time: 27 minutes 12 seconds (2951 mGy). COMPLICATIONS: None immediate. TECHNIQUE: Following a full explanation of the procedure along with the potential associated complications, an informed witnessed consent was obtained. The risks of intracranial hemorrhage of 10%, worsening neurological deficit, ventilator dependency, death and inability to revascularize were all reviewed in  detail with the patient's family. The patient was then put under general anesthesia by the Department of Anesthesiology at [facility]. The right groin was prepped and draped in the usual sterile fashion. Thereafter using modified Seldinger technique, transfemoral access into the right common femoral artery was obtained without difficulty. Over a 0.035 inch guidewire a 5 French Pinnacle sheath was inserted. Through this, and also over a 0.035 inch guidewire a 5 [place] JB 1 catheter was advanced to the aortic arch region and selectively positioned in the right common carotid artery, the right vertebral artery, the left vertebral artery and the left common carotid artery. FINDINGS: The left common carotid arteriogram demonstrates approximately 50% stenosis of the mid segment of the left common carotid artery. The left external carotid artery and its major branches are widely patent. The left internal carotid artery at the bulb demonstrates wide patency. More distally, there is mild decreased caliber of the left internal  carotid artery in its entirety. The petrous, cavernous and supraclinoid segments demonstrate wide patency. The left anterior cerebral artery demonstrates complete occlusion distal to the left A1 segment. This is associated with caliber irregularity in the A1 segment. The left middle cerebral artery M1 segment proximally demonstrates mild caliber irregularity. There is patency of the superior division of the left middle cerebral artery with delayed reconstitution of the distal inferior division of the left middle cerebral artery in the M2 M3 regions. Distal to this the M3 branches and the M4 branches demonstrated gradual ascent of contrast. The inferior division appears to be completely occluded associated with a stump. A diminutive anterior temporal branch is noted. A prominent anterior choroidal artery on the left is noted with delayed arterial blush noted in the region of the splenium of the  corpus callosum. The whole head delayed arterial phase in the lateral projection demonstrates a prominent area of hypoperfusion involving the left parietal cortical and subcortical regions. The left vertebral artery origin is from the thyrocervical trunk. The vessel is seen to opacify to the cranial skull base. Wide patency is seen of the left vertebrobasilar junction and the left posterior inferior cerebellar artery. The basilar artery, the posterior cerebral arteries, the superior cerebellar arteries and the anterior-inferior cerebellar arteries opacify into the capillary and venous phases. Prominent collaterals are seen in the P3 region of the left posterior cerebral artery retrogradely filling the posterior 1/3 of the left parietal cortical and subcortical areas. The delayed arterial phase then demonstrates retrograde opacification of the distal pericallosal artery in the splenium of the corpus callosum from the lateral thalamo perforator branches. Also demonstrated is retrograde opacification of the posterior half of the anterior temporal region from the posterior temporal branches of the P1 P2 region of the left posterior cerebral artery. The right common carotid arteriogram demonstrates mild narrowing in the mid third of the right common carotid artery. The right external carotid artery and its major branches are widely patent. The right internal carotid artery at the bulb to the cranial skull base is seen to opacify unimpeded. The petrous segment is normal. There is mild narrowing of the caval cavernous segment of the right internal carotid artery. Distal to this the supraclinoid segment demonstrates mild decrease in caliber. The right middle cerebral artery demonstrates a 50% stenosis in the distal right M1 segment. The proximal superior and the inferior divisions demonstrate moderate stenosis. Again demonstrated is a prominent anterior choroidal artery on the right side. The delayed arterial phase  demonstrates hypoperfusion of the parietal cortical and subcortical regions. The right vertebral artery origin is from the right thyrocervical trunk. Again this vessel is seen to opacify to the cranial skull base. Wide patency is seen of the right vertebrobasilar junction and the right posterior-inferior cerebellar artery. Multiple focal areas of caliber irregularity are seen involving the posterior-inferior cerebellar artery. The opacified portion of the basilar artery, the proximal posterior cerebral arteries, the superior cerebellar arteries and the anterior-inferior cerebellar arteries are seen to opacify into the capillary and venous phases. Extensive collaterals are again seen from the P3 regions of both posterior cerebral arteries contributing to the posterior parietal middle cerebral artery distributions bilaterally as described earlier. PROCEDURE: ENDOVASCULAR TREATMENT OF OCCLUDED DOMINANT INFERIOR DIVISION OF THE LEFT MIDDLE CEREBRAL ARTERY The diagnostic JB 1 catheter in the left common carotid artery was exchanged over a 0.035 inch 8 French 55 cm Brite tip neurovascular sheath using biplane roadmap technique and constant fluoroscopic guidance. Good aspiration obtained from the  hub of the 8 [place] Brite tip neurovascular sheath. A gentle contrast injection demonstrated no evidence of spasms, dissections or of intraluminal filling defects. This was then connected to continuous heparinized saline infusion. Over the [facility] exchange guidewire, an 85 cm 8 [place] of balloon FlowGate guide catheter which had been prepped with 50% contrast and 50% heparinized saline infusion was advanced and positioned just proximal to the left common carotid bifurcation. The guidewire was removed. Good aspiration was obtained at the hub of the [facility] guide catheter. Over a 0.035 inch Roadrunner guidewire, using biplane roadmap technique and constant fluoroscopic guidance, the FlowGate guide catheter was then advanced to the  mid cervical left ICA. The guidewire was removed. Again good aspiration was obtained from the hub of the 8 [place] FlowGate guide catheter. A gentle control arteriogram performed centered extracranially and intracranially demonstrated no change in the intracranial circulation. Over a 0.014 inch Softip Synchro micro guidewire, a Trevo ProVue 021 microcatheter was advanced to the distal end of the of FlowGate guide catheter. With the micro guidewire leading with a J-tip configuration, the combination was navigated to the supraclinoid left ICA. The micro guidewire was then advanced gently into the inferior division origin followed by the microcatheter. A gentle manipulation of the micro guidewire was then performed using a torque device to encourage advancement through the occluded left middle cerebral artery. The microcatheter was then brought into close proximity to the site of the occlusion. Further manipulation was performed with partial advancement of the micro guidewire through the occluded left middle cerebral artery inferior division. To avoid potential perforation of the more distal inferior division the micro guidewire was retrieved. Good aspiration obtained from the hub of the microcatheter. Approximately 3.6 mg of super selective intracranial intra-arterial Integrilin was infused over a couple of minutes. Thereafter, further attempts were made to encourage advancement of a very soft microguidewire again with only partial success. 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 The procedure was then stopped. The final control arteriogram performed through the [facility] guide catheter in the left internal carotid artery continued to demonstrate no significant change in the intracranial circulation. No angiographic evidence of extravasation or mass-effect or midline shift was seen. A Dyna CT of the head performed revealed no evidence of hemorrhage or change in the ventricular size or mass effect or  midline shift. The [facility] guide catheter and the neurovascular sheath were then exchanged over a J-tip 0.035 guidewire for an 8 [place] Pinnacle sheath. This was then removed with successful hemostasis being achieved by manual compression. At the end of the procedure, the patient's right groin appeared soft without evidence of bleeding or hematoma. Distal pulses continued to be palpable in the dorsalis pedis, and the posterior tibial regions unchanged from prior to the procedure. The patient's general anesthesia was then reversed. The patient was extubated without difficulty. Upon recovery the patient was noted to be able to raise his right arm, and also bend his right knee. He responded appropriately to simple commands. He was then transferred to the PACU and then to neuro ICU for further management. IMPRESSION: Status post attempted revascularization of occluded dominant inferior division of the right middle cerebral artery using 3.6 mg of super selective intracranial intra-arterial Integrilin and mechanical thrombolysis with a micro guidewire. PLAN: Further management as per stroke team. Electronically Signed   By: Julieanne Cotton M.D.   On: 07/06/2017 10:05   TTE  - Left ventricle: Wall thickness was increased in a pattern of mild  LVH. Systolic function was mildly to moderately reduced. The   estimated ejection fraction was in the range of 40% to 45%.   Akinesis of the apicalanteroseptal, anterolateral, and apical   myocardium. Doppler parameters are consistent with abnormal left   ventricular relaxation (grade 1 diastolic dysfunction). - Aortic valve: There was mild regurgitation.   PHYSICAL EXAM  Temp:  [97.6 F (36.4 C)-98.5 F (36.9 C)] 98.3 F (36.8 C) (04/26 0800) Pulse Rate:  [36-88] 73 (04/26 0800) Resp:  [13-22] 16 (04/26 0800) BP: (89-167)/(51-119) 142/57 (04/26 0800) SpO2:  [95 %-100 %] 100 % (04/26 0800) Weight:  [89 kg (196 lb 3.4 oz)-90 kg (198 lb 6.6 oz)] 89 kg (196 lb  3.4 oz) (04/25 1920)  General - Well nourished, well developed, in no apparent distress.  Cardiovascular - bradycardia and irregular. Tele shows bigeminy PACs.  Neuro - awake alert, global aphasia, able to say "yes" and "OK" unreliably to questions. Mimics some, but not all gestures.Keeps showing a thumb up on the left, but not on command. did not follow simple commands.  Gaze conjugate this am, crosses midline and tracks me in room to both sides,attending to right side..  Blinking to visual threat on the left, however not blinking to the right.  PERRL, moderate right facial droop.  Tongue midline.  Right UE flaccid even on pain stimulation. RLE 1/5 on pain summation. LUE and LLE purposeful movement and against gravity.  DTR 1+ and no Babinski. Sensation is decreased to pain stimuli on RUE, coordination not cooperative and gait not tested.   ASSESSMENT/PLAN Mr. KENYATTE CHATMON is a 57 y.o. male with history of CAD/MI s/p stenting and CABG, known cardiomyopathy with EF 35-40% (now improved on current TTE), hypertension, hyperlipidemia, cognitive impairment, obesity, seizure, OSA, ESRD on hemodialysis admitted for AMS and right-sided weakness. No tPA given due to outside window. S/p IA thrombectomy on 4/21  NSTEMI  Hx of CAD/MI - s/p stenting and CABG  Arrythmia and hypertension on evening of 4/24  EEG showed PVCs, tele long period of PVCs, today brady with bigeminy PACs  Troponin 0.62->11.47  K 3.4->4.0  Cardiology recommends DOAC, but currently on Heparin gtt for 48h per NSTEMI protocol  On ASA and plavix - will dc plavix and continue ASA  Stroke:  left MCA infarct, likely etiology is progressive large vessel atherosclerosis.  Chronic intracranial stenosis including bilateral MCAs since 2015.   Resultant global aphasia, right hemiplegia, ongoing rehab efforts needed.  CT left MCA infarct  CTA head and neck left CCA 50% stenosis left M2 cutoff and diffuse intracranial stenosis. Out  pt f/u with Vasc Sx recommended.   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  LDL 131  HgbA1c 4.6  SCDs for VTE prophylaxis Fall precautions  DIET - DYS 1 Room service appropriate? Yes; Fluid consistency: Thin   aspirin 81 mg daily and clopidogrel 75 mg daily prior to admission, now on ASA 325mg  clopidogrel 75 mg daily. Since pt is on heparin IV now, will dc plavix but continue ASA.   Patient counseled to be compliant with his antithrombotic medications  Ongoing aggressive stroke risk factor management, stroke education  Therapy recommendations: SNF vs CIR  Disposition: Pending  History of  stroke  12/2013, admitted for hypotension after HD, MRI showed left caudate head acute infarct, chronic bilateral BG and pontine infarcts.  MRA severe ICAD involving bilateral MCAs.  Carotid Doppler negative.  LDL 89 and A1c 5.6.  Discharged with dual antiplatelet and Crestor 40  Follows with Dr. Terrace Arabia at [facility]  CHF with cardiomyopathy  EF 35 to 40% in 02/2017  TTE EF 40-45% this time- ?thrombus w/akinesis  2/24 pm event with tachy and [facility]  Cardiology consulted, following  On Heparin gtt for now, then poss transition to DOAC? Need to clarify with Cards for long term plan  ESRD on HD  Nephrology on board  HD scheduled MWF PTA  Plan to HD today  Cre 13.49->10.40 ->9.17->12.24->7.71  Hypertension / hypotension Overnight high BP - metoprolol IV  Off Cardene  Low BP on HD   Now on metoprolol 12.5mg  bid  Hydralazine IV PRN given bradycardia  BP goal 120-150 given severe intracranial stenosis  Hyperlipidemia  Home meds: Lipitor 40  LDL 131, goal < 70  Now on Lipitor 80  Continue statin at  discharge  Other Stroke Risk Factors  Obstructive sleep apnea, on CPAP at home  Other Active Problems  Cognitive impairment - following with Dr. Terrace Arabia at [facility]  ?? Seizure, not on meds  Difficulty IV access - now has left EJ  Hospital day # 6  -Continue heparin gtt for next 24h for NSTEMI, d/w Dr Wallace Cullens, CCM in neuro ICU. -Echo read there is no thrombus, but per cardiology note thrombus mentioned and DOAC recommended. Need to clarify for long term goals. -Called Cardiology x2, no call back yet -Continue ASA for intracranial stenosis, there is a hemorrhagic conversion of stroke risk while also on heparin + ASA, as d/w pharmacy.  -This patient is stabilizing post NSTEMI, but patient's care continues to require constant monitoring of vital signs, hemodynamics, respiratory and cardiac monitoring, review of multiple databases, neurological assessment, discussion with family, other specialists and medical decision making of high complexity. -Transfer to progressive cardiac care unit for NSTEMI and arrythmia monitoring -Seen with RN. No family present to updated. I spent 45 minutes of neurocritical care time in the care of this patient. I discussed with Dr. Roda Shutters and Dr Jillyn Hidden in neuro ICU. Calls placed to discuss care with Pharmacy and Cardiology.  Desiree Metzger-Cihelka, ARNP Stroke Neurology 07/10/2017 10:22 AM  ATTENDING NOTE: I reviewed above note and agree with the assessment and plan. I have made any additions or clarifications directly to the above note. Pt was seen and examined.   Pt no acute event overnight, had HD yesterday and Cre much improved. HR stable. Cardiology on board, currently on heparin IV for NSTEMI and also for apical LV thrombus. Will continue anticoagulation. Will dc plavix and continue ASA.   Marvel Plan, MD PhD Stroke Neurology 07/10/2017 4:47 PM     To contact Stroke Continuity provider, please refer to [email or web address]. After hours, contact General Neurology

## 2017-07-11 DIAGNOSIS — I214 Non-ST elevation (NSTEMI) myocardial infarction: Secondary | ICD-10-CM

## 2017-07-11 DIAGNOSIS — I236 Thrombosis of atrium, auricular appendage, and ventricle as current complications following acute myocardial infarction: Secondary | ICD-10-CM

## 2017-07-11 LAB — GLUCOSE, CAPILLARY
GLUCOSE-CAPILLARY: 101 mg/dL — AB (ref 65–99)
GLUCOSE-CAPILLARY: 110 mg/dL — AB (ref 65–99)
Glucose-Capillary: 112 mg/dL — ABNORMAL HIGH (ref 65–99)
Glucose-Capillary: 123 mg/dL — ABNORMAL HIGH (ref 65–99)
Glucose-Capillary: 99 mg/dL (ref 65–99)

## 2017-07-11 LAB — CBC
HEMATOCRIT: 40.4 % (ref 39.0–52.0)
HEMOGLOBIN: 13.6 g/dL (ref 13.0–17.0)
MCH: 31.6 pg (ref 26.0–34.0)
MCHC: 33.7 g/dL (ref 30.0–36.0)
MCV: 93.7 fL (ref 78.0–100.0)
Platelets: 297 10*3/uL (ref 150–400)
RBC: 4.31 MIL/uL (ref 4.22–5.81)
RDW: 14.7 % (ref 11.5–15.5)
WBC: 11.2 10*3/uL — AB (ref 4.0–10.5)

## 2017-07-11 LAB — BASIC METABOLIC PANEL
Anion gap: 17 — ABNORMAL HIGH (ref 5–15)
BUN: 27 mg/dL — ABNORMAL HIGH (ref 6–20)
CHLORIDE: 93 mmol/L — AB (ref 101–111)
CO2: 27 mmol/L (ref 22–32)
Calcium: 10.8 mg/dL — ABNORMAL HIGH (ref 8.9–10.3)
Creatinine, Ser: 10.3 mg/dL — ABNORMAL HIGH (ref 0.61–1.24)
GFR calc Af Amer: 6 mL/min — ABNORMAL LOW (ref >60)
GFR calc non Af Amer: 5 mL/min — ABNORMAL LOW (ref >60)
GLUCOSE: 110 mg/dL — AB (ref 65–99)
POTASSIUM: 3.6 mmol/L (ref 3.5–5.1)
SODIUM: 137 mmol/L (ref 135–145)

## 2017-07-11 LAB — HEPARIN LEVEL (UNFRACTIONATED): Heparin Unfractionated: 0.57 IU/mL (ref 0.30–0.70)

## 2017-07-11 MED ORDER — SODIUM CHLORIDE 0.9 % IV SOLN: 100.0000 mL | INTRAVENOUS | Status: DC | PRN

## 2017-07-11 MED ORDER — PENTAFLUOROPROP-TETRAFLUOROETH EX AERO: 1.0000 "application " | INHALATION_SPRAY | CUTANEOUS | Status: DC | PRN

## 2017-07-11 MED ORDER — HEPARIN SODIUM (PORCINE) 1000 UNIT/ML DIALYSIS: 1000.0000 [IU] | INTRAMUSCULAR | Status: DC | PRN

## 2017-07-11 MED ORDER — LIDOCAINE HCL (PF) 1 % IJ SOLN: 5.0000 mL | INTRAMUSCULAR | Status: DC | PRN

## 2017-07-11 MED ORDER — HEPARIN SODIUM (PORCINE) 1000 UNIT/ML DIALYSIS: 20.0000 [IU]/kg | INTRAMUSCULAR | Status: DC | PRN

## 2017-07-11 MED ORDER — LIDOCAINE-PRILOCAINE 2.5-2.5 % EX CREA: 1.0000 "application " | TOPICAL_CREAM | CUTANEOUS | Status: DC | PRN

## 2017-07-11 MED ORDER — ASPIRIN 81 MG PO CHEW
324.0000 mg | CHEWABLE_TABLET | Freq: Every day | ORAL | Status: DC
  Administered 2017-07-11 – 2017-07-16 (×6): 324 mg via ORAL
  Filled 2017-07-11 (×6): qty 4

## 2017-07-11 MED ORDER — ALTEPLASE 2 MG IJ SOLR: 2.0000 mg | Freq: Once | INTRAMUSCULAR | Status: DC | PRN

## 2017-07-11 MED ORDER — CINACALCET HCL 30 MG PO TABS
90.0000 mg | ORAL_TABLET | Freq: Every day | ORAL | Status: DC
  Administered 2017-07-14 – 2017-07-16 (×3): 90 mg via ORAL
  Filled 2017-07-11 (×7): qty 3

## 2017-07-11 NOTE — Progress Notes (Signed)
[facility] Progress Note   Dialysis Orders: MWF - [facility]  4.5hrs, BFR 400, DFR 800,  EDW 89kg, 2K/ 2.5Ca  Access: LU AVF  Heparin 4000 unit bolus w/2000 Unit midrun Venofer 50mg  IV qwk Hectorol 3 mcg IV qHD   Home meds: ASA/atorvastatin/clopidogrel/nitro Clonidine 0.2 BID/metoprolol tartrate 25mg  BID/terazosin [name] 1000mg  2AC TID, and 1BID snacks Uloric 40mg  qd   Assessment/Plan: 1. 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 Corliss Skains. CTA showed diffuse intracranial stenosis. 2. ESRD MWF - He has received HD T and Th this week - patient load heavy today - will write for HD - can do late today or tomorrow. 3.   Anemia -  hgb 13.6 no esa -  4.   Secondary hyperparathyroidism - hypercalcemia Ca 10.8- not clear why Hectorol reduced to 2 from three upon admission - Hold dose today and resume next week - plan resume sensipar 90 which may be why calcium is high - usual Ca ~10 5.   HTN/volume -BP elevated much more so than out - our med list had clonidine 0.2 bid and MTP 25 bid but no refills documented. Try volume reduction with HD - if BP doesn't come down would ^ MTP back to 25 bid 6.   Nutrition -D1 diet/vits - right handed - at baseline - unable to use 2/2  Paresis - at risk for inadequate intake/nepro ordered bid   Sheffield Slider, PA-C [facility] Beeper [phone] 07/11/2017,10:44 AM  LOS: 6 days   Subjective:   Thumbs up  Objective Vitals:   07/11/17 0605 07/11/17 0700 07/11/17 0800 07/11/17 0805  BP: (!) 150/85 (!) 156/112  (!) 155/102  Pulse:  (!) 114    Resp:  17  18  Temp:   98.1 F (36.7 C)   TempSrc:   Oral   SpO2:  99%  99%  Weight:      Height:       Physical Exam General: supine in bed breathing easily  Heart: RRR Lungs: no rales Abdomen: soft NT Extremities: no LE edema right sided paresis - Dialysis Access: left AVF +  bruit   Additional Objective Labs: Basic Metabolic Panel: Recent Labs  Lab 07/07/17 0804  07/09/17 0750 07/10/17 0403 07/11/17 0613  NA 135   < > 137 137 137  K 3.4*   < > 4.0 3.8 3.6  CL 100*   < > 98* 97* 93*  CO2 21*   < > 21* 26 27  GLUCOSE 89   < > 95 107* 110*  BUN 26*   < > 27* 13 27*  CREATININE 10.40*   < > 12.24* 7.71* 10.30*  CALCIUM 9.2   < > 10.5* 9.8 10.8*  PHOS 4.5  --   --   --   --    < > = values in this interval not displayed.   Liver Function Tests: Recent Labs  Lab 07/05/17 2215 07/07/17 0804 07/08/17 2100  AST 15  --  21  ALT 9*  --  <5*  ALKPHOS 40  --  49  BILITOT 1.0  --  0.9  PROT 7.4  --  7.0  ALBUMIN 3.9 3.3* 3.6   No results for input(s): LIPASE, AMYLASE in the last 168 hours. CBC: Recent Labs  Lab 07/05/17 2215  07/06/17 0510  07/08/17 0406 07/08/17 2100 07/09/17 0447 07/10/17 0403 07/11/17 0613  WBC 14.8*  --  12.0*   < > 8.5 7.5 9.3 7.4 11.2*  NEUTROABS 12.7*  --  9.0*  --   --  5.7  --   --   --   HGB 13.2   < > 11.4*   < > 13.2 13.1 13.0 12.7* 13.6  HCT 38.9*   < > 32.9*   < > 39.7 38.9* 39.6 38.3* 40.4  MCV 92.4  --  91.1   < > 93.0 93.1 93.6 93.9 93.7  PLT 191  --  197   < > 220 229 169 250 297   < > = values in this interval not displayed.   Blood Culture    Component Value Date/Time   SDES BLOOD RIGHT HAND 12/29/2013 2221   SPECREQUEST BOTTLES DRAWN AEROBIC ONLY 7CC 12/29/2013 2221   CULT  12/29/2013 2221    NO GROWTH 5 DAYS Performed at [facility]   REPTSTATUS 01/05/2014 FINAL 12/29/2013 2221    Cardiac Enzymes: Recent Labs  Lab 07/08/17 2100 07/09/17 0750  TROPONINI 0.62* 11.47*   CBG: Recent Labs  Lab 07/09/17 2118 07/10/17 0838 07/10/17 1230 07/10/17 1746 07/11/17 0813  GLUCAP 78 91 90 91 99   Iron Studies: No results for input(s): IRON, TIBC, TRANSFERRIN, FERRITIN in the last 72 hours. Lab Results  Component Value Date   INR 1.16 07/05/2017   INR 1.48 03/04/2017   INR 1.27  12/29/2013   Studies/Results: No results found. Medications: . sodium chloride    . sodium chloride    . heparin 1,000 Units/hr (07/11/17 0725)   .  stroke: mapping our early stages of recovery book   Does not apply Once  . aspirin EC  325 mg Oral Daily  . atorvastatin  80 mg Oral Daily  . doxercalciferol  2 mcg Intravenous Q M,W,F-HD  . febuxostat  40 mg Oral Daily  . feeding supplement (NEPRO CARB STEADY)  237 mL Oral BID BM  . mouth rinse  15 mL Mouth Rinse BID  . memantine  10 mg Oral BID  . metoprolol tartrate  12.5 mg Oral BID  . nitroGLYCERIN  1 inch Topical Q6H

## 2017-07-11 NOTE — Progress Notes (Signed)
Progress Note  Patient Name: [name] Date of Encounter: 07/11/2017  Primary Cardiologist: No primary care provider on file.   Subjective   Does not communicate verbally but gives a thumbs up that he is feeling ok and no CP or SOB  Inpatient Medications    Scheduled Meds: .  stroke: mapping our early stages of recovery book   Does not apply Once  . aspirin  324 mg Oral Daily  . atorvastatin  80 mg Oral Daily  . doxercalciferol  2 mcg Intravenous Q M,W,F-HD  . febuxostat  40 mg Oral Daily  . feeding supplement (NEPRO CARB STEADY)  237 mL Oral BID BM  . mouth rinse  15 mL Mouth Rinse BID  . memantine  10 mg Oral BID  . metoprolol tartrate  12.5 mg Oral BID  . nitroGLYCERIN  1 inch Topical Q6H   Continuous Infusions: . sodium chloride    . sodium chloride    . heparin 1,000 Units/hr (07/11/17 0725)   PRN Meds: sodium chloride, sodium chloride, acetaminophen **OR** acetaminophen (TYLENOL) oral liquid 160 mg/5 mL **OR** acetaminophen, alteplase, heparin, heparin, hydrALAZINE, lidocaine (PF), lidocaine (PF), lidocaine (PF), lidocaine-prilocaine, ondansetron (ZOFRAN) IV, pentafluoroprop-tetrafluoroeth   Vital Signs    Vitals:   07/11/17 0605 07/11/17 0700 07/11/17 0800 07/11/17 0805  BP: (!) 150/85 (!) 156/112  (!) 155/102  Pulse:  (!) 114    Resp:  17  18  Temp:   98.1 F (36.7 C)   TempSrc:   Oral   SpO2:  99%  99%  Weight:      Height:        Intake/Output Summary (Last 24 hours) at 07/11/2017 1116 Last data filed at 07/11/2017 0900 Gross per 24 hour  Intake 229.34 ml  Output -  Net 229.34 ml   Filed Weights   07/07/17 1138 07/09/17 1513 07/09/17 1920  Weight: 193 lb 9 oz (87.8 kg) 198 lb 6.6 oz (90 kg) 196 lb 3.4 oz (89 kg)    Telemetry    NSR - Personally Reviewed  ECG    No new EKG to review - Personally Reviewed  Physical Exam   GEN: No acute distress.   Neck: No JVD Cardiac: RRR, no murmurs, rubs, or gallops.  Respiratory: Clear to  auscultation bilaterally. GI: Soft, nontender, non-distended  MS: No edema; No deformity. Neuro:  Nonfocal  Psych: cannot assess  Labs    Chemistry Recent Labs  Lab 07/05/17 2215  07/07/17 0804  07/08/17 2100 07/09/17 0750 07/10/17 0403 07/11/17 0613  NA 136   < > 135   < > 136 137 137 137  K 4.0   < > 3.4*   < > 3.4* 4.0 3.8 3.6  CL 94*   < > 100*   < > 95* 98* 97* 93*  CO2 20*   < > 21*   < > 25 21* 26 27  GLUCOSE 135*   < > 89   < > 147* 95 107* 110*  BUN 39*   < > 26*   < > 23* 27* 13 27*  CREATININE 14.46*   < > 10.40*   < > 11.20* 12.24* 7.71* 10.30*  CALCIUM 10.7*   < > 9.2   < > 10.4* 10.5* 9.8 10.8*  PROT 7.4  --   --   --  7.0  --   --   --   ALBUMIN 3.9  --  3.3*  --  3.6  --   --   --  AST 15  --   --   --  21  --   --   --   ALT 9*  --   --   --  <5*  --   --   --   ALKPHOS 40  --   --   --  49  --   --   --   BILITOT 1.0  --   --   --  0.9  --   --   --   GFRNONAA 3*   < > 5*   < > 4* 4* 7* 5*  GFRAA 4*   < > 6*   < > 5* 5* 8* 6*  ANIONGAP 22*   < > 14   < > 16* 18* 14 17*   < > = values in this interval not displayed.     Hematology Recent Labs  Lab 07/09/17 0447 07/10/17 0403 07/11/17 0613  WBC 9.3 7.4 11.2*  RBC 4.23 4.08* 4.31  HGB 13.0 12.7* 13.6  HCT 39.6 38.3* 40.4  MCV 93.6 93.9 93.7  MCH 30.7 31.1 31.6  MCHC 32.8 33.2 33.7  RDW 14.8 14.9 14.7  PLT 169 250 297    Cardiac Enzymes Recent Labs  Lab 07/08/17 2100 07/09/17 0750  TROPONINI 0.62* 11.47*    Recent Labs  Lab 07/05/17 2231  TROPIPOC 0.08     BNPNo results for input(s): BNP, PROBNP in the last 168 hours.   DDimer No results for input(s): DDIMER in the last 168 hours.   Radiology    No results found.  Cardiac Studies   Echo 07/06/17  Study Conclusions  - Left ventricle: Wall thickness was increased in a pattern of mild LVH. Systolic function was mildly to moderately reduced. The estimated ejection fraction was in the range of 40% to 45%. Akinesis of  the apicalanteroseptal, anterolateral, and apical myocardium. Doppler parameters are consistent with abnormal left ventricular relaxation (grade 1 diastolic dysfunction). - Aortic valve: There was mild regurgitation.  Patient Profile     57 y.o. male  with history of CAD (s/p CABG in 2013), ESRD, HTN, CVA, HL, GERD and anemia   Admitted with CVA with R sided hemiplegia Assessment & Plan    1.  NSTEMI   --Troponin 11.47 but has not peaked --continue to check trop until peak has occurred --Doesn't communicate well but no CP --mildly reduced LVF on echo with apical anterolateral, anterolateral and apical myocardium and likely small LV thrombus by Dr. Tenny Craw review --Now on IV Heparin gtt and ultimately will nee Coumadin --medical management for now in setting of acute CVA  2. S/P L MCA CVA   Failed thrombectomy   -- per Neuro  3. HTN   --BP still running high at 155 or 102 mmHg --BP parameters per neuro with passive HTN --consider increasing dose of Carvedilol as he has mild tachycardia as well  5  Acute on chronic systolic CHF --EF 40-45% with apical AK --volume managed by nephrology --continue BB --would benefit from ACE I if ok with neuro and renal  I have spent a total of 35 minutes with patient reviewing echo , telemetry, EKGs, labs and examining patient as well as establishing an assessment and plan that was discussed with the patient.  > 50% of time was spent in direct patient care.    For questions or updates, please contact [facility] Please consult [email or web address] for contact info under Cardiology/STEMI.      Signed, Armanda Magic,  MD  07/11/2017, 11:16 AM

## 2017-07-11 NOTE — Progress Notes (Signed)
STROKE TEAM PROGRESS NOTE   SUBJECTIVE (INTERVAL HISTORY) Neuro exam stable. Heparin gtt still going for NSTEMI. Seems to not be having any CP or any discomforts, difficult to assess d/t global aphasia. No further arrhythmias overnight, tele is now NSR. Medication list reviewed. No role in triple tx w/Heparin, ASA + Plavix, thus dropped the Plavix at this time.  OBJECTIVE Temp:  [97.2 F (36.2 C)-98.5 F (36.9 C)] 98.1 F (36.7 C) (04/27 0800) Pulse Rate:  [24-114] 114 (04/27 0700) Cardiac Rhythm: Normal sinus rhythm (04/27 0805) Resp:  [14-25] 18 (04/27 0805) BP: (114-179)/(68-142) 155/102 (04/27 0805) SpO2:  [97 %-100 %] 99 % (04/27 0805)  Recent Labs  Lab 07/09/17 2118 07/10/17 0838 07/10/17 1230 07/10/17 1746 07/11/17 0813  GLUCAP 78 91 90 91 99   Recent Labs  Lab 07/07/17 0804 07/08/17 0406 07/08/17 2100 07/09/17 0750 07/10/17 0403 07/11/17 0613  NA 135 135 136 137 137 137  K 3.4* 3.7 3.4* 4.0 3.8 3.6  CL 100* 96* 95* 98* 97* 93*  CO2 21* 21* 25 21* 26 27  GLUCOSE 89 86 147* 95 107* 110*  BUN 26* 16 23* 27* 13 27*  CREATININE 10.40* 9.17* 11.20* 12.24* 7.71* 10.30*  CALCIUM 9.2 10.1 10.4* 10.5* 9.8 10.8*  MG  --   --  2.1  --   --   --   PHOS 4.5  --   --   --   --   --    Recent Labs  Lab 07/05/17 2215 07/07/17 0804 07/08/17 2100  AST 15  --  21  ALT 9*  --  <5*  ALKPHOS 40  --  49  BILITOT 1.0  --  0.9  PROT 7.4  --  7.0  ALBUMIN 3.9 3.3* 3.6   Recent Labs  Lab 07/05/17 2215  07/06/17 0510  07/08/17 0406 07/08/17 2100 07/09/17 0447 07/10/17 0403 07/11/17 0613  WBC 14.8*  --  12.0*   < > 8.5 7.5 9.3 7.4 11.2*  NEUTROABS 12.7*  --  9.0*  --   --  5.7  --   --   --   HGB 13.2   < > 11.4*   < > 13.2 13.1 13.0 12.7* 13.6  HCT 38.9*   < > 32.9*   < > 39.7 38.9* 39.6 38.3* 40.4  MCV 92.4  --  91.1   < > 93.0 93.1 93.6 93.9 93.7  PLT 191  --  197   < > 220 229 169 250 297   < > = values in this interval not displayed.   Recent Labs  Lab  07/08/17 2100 07/09/17 0750  TROPONINI 0.62* 11.47*       Component Value Date/Time   CHOL 180 07/06/2017 0510   CHOL 208 (H) 03/19/2017 1152   TRIG 98 07/06/2017 0510   HDL 29 (L) 07/06/2017 0510   HDL 40 03/19/2017 1152   CHOLHDL 6.2 07/06/2017 0510   VLDL 20 07/06/2017 0510   LDLCALC 131 (H) 07/06/2017 0510   LDLCALC 139 (H) 03/19/2017 1152   Lab Results  Component Value Date   HGBA1C 4.6 (L) 07/06/2017    I have personally reviewed the radiological images below and agree with the radiology interpretations.  Ct Angio Head W Or Wo Contrast  Result Date: 07/05/2017 CLINICAL DATA:  Right hemiparesis and difficulty speaking. EXAM: CT ANGIOGRAPHY HEAD AND NECK CT PERFUSION BRAIN TECHNIQUE: Multidetector CT imaging of the head and neck was performed using the standard protocol during  bolus administration of intravenous contrast. Multiplanar CT image reconstructions and MIPs were obtained to evaluate the vascular anatomy. Carotid stenosis measurements (when applicable) are obtained utilizing NASCET criteria, using the distal internal carotid diameter as the denominator. Multiphase CT imaging of the brain was performed following IV bolus contrast injection. Subsequent parametric perfusion maps were calculated using RAPID software. CONTRAST:  ISOVUE-370 IOPAMIDOL (ISOVUE-370) INJECTION 76% COMPARISON:  Head MRA 12/29/2013.  Neck MRA 06/30/2005 FINDINGS: CTA NECK FINDINGS Aortic arch: Standard 3 vessel aortic arch. Widely patent arch vessel origins. Right carotid system: Patent with predominantly soft plaque in the common carotid artery resulting in less than 50% narrowing. Minimal calcified plaque at the carotid bifurcation. No ICA stenosis. Left carotid system: Patent with predominantly soft plaque in the distal common carotid artery resulting in 50% stenosis. Moderate calcified plaque about the carotid bifurcation. No ICA stenosis. Vertebral arteries: Patent with the left being slightly  dominant. Mild proximal right V2 stenosis due to calcified plaque. Calcified and soft plaque in the left V1 segment with mild multifocal stenoses. Skeleton: Mild cervical disc degeneration. Other neck: Multiple thyroid nodules including a 2.9 cm exophytic. Nodule extending inferiorly from the isthmus. Upper chest: Clear lung apices. Review of the MIP images confirms the above findings CTA HEAD FINDINGS Anterior circulation: The internal carotid arteries are patent from skull base to carotid termini with calcified plaque resulting in mild cavernous and supraclinoid stenosis on the right. The M1 segments are patent with mild stenosis on the right. The left M2 inferior division is occluded at its origin with some reconstitution of more distal M2 and M3 branches, however there are numerous missing distal branches in the left parietal region corresponding to the acute infarct. The prior MRA also suggested a severe stenosis or possibly short segment occlusion of the left M2 inferior division, however motion artifact on that study makes it difficult to make a detailed assessment of interval changes. There are also likely some missing distal left MCA superior division branch vessels. There also severe proximal right M2 stenoses which are at least partially chronic. The right A1 segment is occluded. The left A1 segment is patent but demonstrates a severe proximal stenosis. Both A2 segments are heavily diseased with multifocal severe stenoses and or short segment occlusions. The appearance of the ACAs is grossly similar to the prior motion degraded MRA. No aneurysm. Posterior circulation: The intracranial vertebral arteries are patent to the basilar. There is right greater than left V4 segment calcified plaque without significant stenosis. Patent SCA origins are identified bilaterally. PICA and AICA origins are not well evaluated. The basilar artery is widely patent. Posterior communicating arteries are diminutive or absent.  PCAs are patent with mild left P2 stenosis. No aneurysm. Venous sinuses: Inadequately assessed due to arterial phase contrast timing. Anatomic variants: None. Delayed phase: Not performed. Review of the MIP images confirms the above findings CT Brain Perfusion Findings: CBF (<30%) Volume: 28mL Perfusion (Tmax>6.0s) volume: , however a substantial amount of this is located in the right cerebral hemisphere Mismatch Volume: though with the caveat that this is artifactually inflated due to (likely chronic) prolonged perfusion in the right cerebral hemisphere. There is still however an apparent sizable penumbra in the posterior left cerebral hemisphere. Infarction Location:Predominantly left parietal lobe with a small amount of posterior left frontal lobe, posterior left temporal lobe, and posterior insula involvement. IMPRESSION: 1. 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 2. Left M2 inferior division occlusion at its origin with some distal reconstitution.  Severe stenosis (or possibly short segment occlusion) in this location on a prior MRA. Numerous missing distal branch vessels in the left parietal region corresponding to the acute infarct. 3. Advanced intracranial atherosclerosis with numerous severe stenoses as above. 4. 50% left common carotid artery stenosis. 5.  Aortic Atherosclerosis (ICD10-I70.0). 6. Thyroid nodules measuring up to 2.9 cm. Nonemergent thyroid ultrasound recommended for further evaluation. These results were communicated to Dr. Amada Jupiter at 10:18 pm on 07/05/2017 by text page via the [facility] messaging system. Electronically Signed   By: Sebastian Ache M.D.   On: 07/05/2017 22:47   Ct Head Wo Contrast  Result Date: 07/08/2017 CLINICAL DATA:  57 y/o M; stroke with sudden increase in blood pressure. EXAM: CT HEAD WITHOUT CONTRAST TECHNIQUE: Contiguous axial images were obtained from the base of the skull through the vertex without intravenous contrast. COMPARISON:   07/06/2017 CT head. FINDINGS: Brain: Small area of loss of gray-white differentiation within the left parietal lobe is similar in distribution comparison with area of infarction on MRI and is compatible with evolving late acute to early subacute infarction. No new area of acute infarction, hemorrhage, or focal mass effect identified. Stable small chronic infarction in the right posterior temporal lobe, left hemi pons, left inferior cerebellum, and lacunar infarctions within bilateral anterior basal ganglia. Stable background of chronic microvascular ischemic changes and parenchymal volume loss of the brain. Vascular: Persistent contrast within the venous and arterial systems. Skull: Normal. Negative for fracture or focal lesion. Sinuses/Orbits: No acute finding. Other: None. IMPRESSION: 1. Left parietal region of acute infarction is stable in comparison with prior MRI given differences in technique. No new acute intracranial abnormality identified. 2. Stable background of chronic infarctions, microvascular ischemic changes, parenchymal volume loss of the brain. Electronically Signed   By: Mitzi Hansen M.D.   On: 07/08/2017 20:03   Ct Head Wo Contrast  Result Date: 07/06/2017 CLINICAL DATA:  Stroke follow-up EXAM: CT HEAD WITHOUT CONTRAST TECHNIQUE: Contiguous axial images were obtained from the base of the skull through the vertex without intravenous contrast. COMPARISON:  Brain MRI 07/06/2017 Head CT 07/06/2017 at 5:35 a.m. FINDINGS: Brain: Decreased density of material of the left basal ganglia, consistent with clearing of contrast staining. Right temporal encephalomalacia is unchanged. No acute hemorrhage or mass effect. No hydrocephalus. Vascular: Small amount of residual contrast material within the vessels. Skull: Normal visualized skull base, calvarium and extracranial soft tissues. Sinuses/Orbits: No sinus fluid levels or advanced mucosal thickening. No mastoid effusion. Normal orbits.  IMPRESSION: Decreased density of hyperdense material at the left basal ganglia, consistent with clearing of contrast staining. No new abnormality. Electronically Signed   By: Deatra Robinson M.D.   On: 07/06/2017 23:24   Ct Head Wo Contrast  Result Date: 07/06/2017 CLINICAL DATA:  Left MCA infarct status post intervention EXAM: CT HEAD WITHOUT CONTRAST TECHNIQUE: Contiguous axial images were obtained from the base of the skull through the vertex without intravenous contrast. COMPARISON:  Cerebral angiogram 07/05/2017 CTA head neck 07/05/2017 FINDINGS: Brain: There is hyperdensity within the left basal ganglia, likely indicating contrast staining. No midline shift or mass effect. No hydrocephalus. Unchanged right temporal lobe encephalomalacia. Vascular: There is intravascular contrast material related to prior an angiographic study. Skull: Normal visualized skull base, calvarium and extracranial soft tissues. Sinuses/Orbits: No sinus fluid levels or advanced mucosal thickening. No mastoid effusion. Normal orbits. IMPRESSION: Hyperdensity of the left basal ganglia is favored to represent contrast staining over petechial hemorrhage. Electronically Signed   By: Caryn Bee  Chase Picket M.D.   On: 07/06/2017 05:56   Ct Angio Neck W Or Wo Contrast  Result Date: 07/05/2017 CLINICAL DATA:  Right hemiparesis and difficulty speaking. EXAM: CT ANGIOGRAPHY HEAD AND NECK CT PERFUSION BRAIN TECHNIQUE: Multidetector CT imaging of the head and neck was performed using the standard protocol during bolus administration of intravenous contrast. Multiplanar CT image reconstructions and MIPs were obtained to evaluate the vascular anatomy. Carotid stenosis measurements (when applicable) are obtained utilizing NASCET criteria, using the distal internal carotid diameter as the denominator. Multiphase CT imaging of the brain was performed following IV bolus contrast injection. Subsequent parametric perfusion maps were calculated using RAPID  software. CONTRAST:  ISOVUE-370 IOPAMIDOL (ISOVUE-370) INJECTION 76% COMPARISON:  Head MRA 12/29/2013.  Neck MRA 06/30/2005 FINDINGS: CTA NECK FINDINGS Aortic arch: Standard 3 vessel aortic arch. Widely patent arch vessel origins. Right carotid system: Patent with predominantly soft plaque in the common carotid artery resulting in less than 50% narrowing. Minimal calcified plaque at the carotid bifurcation. No ICA stenosis. Left carotid system: Patent with predominantly soft plaque in the distal common carotid artery resulting in 50% stenosis. Moderate calcified plaque about the carotid bifurcation. No ICA stenosis. Vertebral arteries: Patent with the left being slightly dominant. Mild proximal right V2 stenosis due to calcified plaque. Calcified and soft plaque in the left V1 segment with mild multifocal stenoses. Skeleton: Mild cervical disc degeneration. Other neck: Multiple thyroid nodules including a 2.9 cm exophytic. Nodule extending inferiorly from the isthmus. Upper chest: Clear lung apices. Review of the MIP images confirms the above findings CTA HEAD FINDINGS Anterior circulation: The internal carotid arteries are patent from skull base to carotid termini with calcified plaque resulting in mild cavernous and supraclinoid stenosis on the right. The M1 segments are patent with mild stenosis on the right. The left M2 inferior division is occluded at its origin with some reconstitution of more distal M2 and M3 branches, however there are numerous missing distal branches in the left parietal region corresponding to the acute infarct. The prior MRA also suggested a severe stenosis or possibly short segment occlusion of the left M2 inferior division, however motion artifact on that study makes it difficult to make a detailed assessment of interval changes. There are also likely some missing distal left MCA superior division branch vessels. There also severe proximal right M2 stenoses which are at least  partially chronic. The right A1 segment is occluded. The left A1 segment is patent but demonstrates a severe proximal stenosis. Both A2 segments are heavily diseased with multifocal severe stenoses and or short segment occlusions. The appearance of the ACAs is grossly similar to the prior motion degraded MRA. No aneurysm. Posterior circulation: The intracranial vertebral arteries are patent to the basilar. There is right greater than left V4 segment calcified plaque without significant stenosis. Patent SCA origins are identified bilaterally. PICA and AICA origins are not well evaluated. The basilar artery is widely patent. Posterior communicating arteries are diminutive or absent. PCAs are patent with mild left P2 stenosis. No aneurysm. Venous sinuses: Inadequately assessed due to arterial phase contrast timing. Anatomic variants: None. Delayed phase: Not performed. Review of the MIP images confirms the above findings CT Brain Perfusion Findings: CBF (<30%) Volume: 28mL Perfusion (Tmax>6.0s) volume: , however a substantial amount of this is located in the right cerebral hemisphere Mismatch Volume: though with the caveat that this is artifactually inflated due to (likely chronic) prolonged perfusion in the right cerebral hemisphere. There is still however an apparent  sizable penumbra in the posterior left cerebral hemisphere. Infarction Location:Predominantly left parietal lobe with a small amount of posterior left frontal lobe, posterior left temporal lobe, and posterior insula involvement. IMPRESSION: 1. 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 2. Left M2 inferior division occlusion at its origin with some distal reconstitution. Severe stenosis (or possibly short segment occlusion) in this location on a prior MRA. Numerous missing distal branch vessels in the left parietal region corresponding to the acute infarct. 3. Advanced intracranial atherosclerosis with numerous severe stenoses  as above. 4. 50% left common carotid artery stenosis. 5.  Aortic Atherosclerosis (ICD10-I70.0). 6. Thyroid nodules measuring up to 2.9 cm. Nonemergent thyroid ultrasound recommended for further evaluation. These results were communicated to Dr. Amada Jupiter at 10:18 pm on 07/05/2017 by text page via the [facility] messaging system. Electronically Signed   By: Sebastian Ache M.D.   On: 07/05/2017 22:47   Mr Maxine Glenn Head Wo Contrast  Result Date: 07/06/2017 CLINICAL DATA:  Stroke follow-up EXAM: MRI HEAD WITHOUT CONTRAST MRA HEAD WITHOUT CONTRAST TECHNIQUE: Multiplanar, multiecho pulse sequences of the brain and surrounding structures were obtained without intravenous contrast. Angiographic images of the head were obtained using MRA technique without contrast. COMPARISON:  CT, CTA, and CTP 07/05/2017.  Brain MRI 04/27/2016 FINDINGS: MRI HEAD FINDINGS Brain: Cortically based restricted diffusion in the posterior left insula, superior temporal lobe, parietal lobe, and minimally involving the posterior left frontal lobe. There is good correlation with infarct by CT perfusion. Small remote infarcts in the left superior cerebellum, left paramedian pons, left basal ganglia/internal capsule, and right temporal parietal cortex. There is chronic blood products at the remote left basal ganglia infarct. No hemorrhage seen at the acute infarct. No hydrocephalus or masslike finding. Vascular: Arterial findings below. Normal dural venous sinus flow voids. Skull and upper cervical spine: Negative Sinuses/Orbits: Negative MRA HEAD FINDINGS Symmetric carotid and vertebral arteries. The vertebral and basilar arteries are smooth and diffusely patent. Atheromatous irregularity of bilateral posterior cerebral arteries with high-grade left P2 segment narrowing. Essentially nonvisualized bilateral ACA attributed to advanced atheromatous narrowing based on prior CTA. Less robust flow signal seen in the left MCA with continued nonvisualization of the  inferior division. The left superior division and right MCA vasculature show diffuse atheromatous irregularity. IMPRESSION: 1. Acute left MCA territory cortical infarct without detected progression when compared to CTP from yesterday. No hemorrhagic conversion. 2. Continued non visualization of the left MCA inferior division. 3. There is advanced intracranial atherosclerosis with nonvisualized ACAs and severe left P2 segment stenosis. 4. Remote small vessel infarcts as described. Electronically Signed   By: Marnee Spring M.D.   On: 07/06/2017 12:59   Mr Brain Wo Contrast  Result Date: 07/06/2017 CLINICAL DATA:  Stroke follow-up EXAM: MRI HEAD WITHOUT CONTRAST MRA HEAD WITHOUT CONTRAST TECHNIQUE: Multiplanar, multiecho pulse sequences of the brain and surrounding structures were obtained without intravenous contrast. Angiographic images of the head were obtained using MRA technique without contrast. COMPARISON:  CT, CTA, and CTP 07/05/2017.  Brain MRI 04/27/2016 FINDINGS: MRI HEAD FINDINGS Brain: Cortically based restricted diffusion in the posterior left insula, superior temporal lobe, parietal lobe, and minimally involving the posterior left frontal lobe. There is good correlation with infarct by CT perfusion. Small remote infarcts in the left superior cerebellum, left paramedian pons, left basal ganglia/internal capsule, and right temporal parietal cortex. There is chronic blood products at the remote left basal ganglia infarct. No hemorrhage seen at the acute infarct. No hydrocephalus or masslike finding. Vascular: Arterial  findings below. Normal dural venous sinus flow voids. Skull and upper cervical spine: Negative Sinuses/Orbits: Negative MRA HEAD FINDINGS Symmetric carotid and vertebral arteries. The vertebral and basilar arteries are smooth and diffusely patent. Atheromatous irregularity of bilateral posterior cerebral arteries with high-grade left P2 segment narrowing. Essentially nonvisualized  bilateral ACA attributed to advanced atheromatous narrowing based on prior CTA. Less robust flow signal seen in the left MCA with continued nonvisualization of the inferior division. The left superior division and right MCA vasculature show diffuse atheromatous irregularity. IMPRESSION: 1. Acute left MCA territory cortical infarct without detected progression when compared to CTP from yesterday. No hemorrhagic conversion. 2. Continued non visualization of the left MCA inferior division. 3. There is advanced intracranial atherosclerosis with nonvisualized ACAs and severe left P2 segment stenosis. 4. Remote small vessel infarcts as described. Electronically Signed   By: Marnee Spring M.D.   On: 07/06/2017 12:59   Ir Ct Head Ltd  Result Date: 07/07/2017 INDICATION: Flaccid right and arm leg. Left gaze deviation. Dysarthria. Occluded inferior division of the left middle cerebral artery. EXAM: 1. EMERGENT LARGE VESSEL OCCLUSION THROMBOLYSIS (anterior CIRCULATION) COMPARISON:  CT angiogram of the head and neck of 07/05/2017. MEDICATIONS: Ancef 2 g IV antibiotic was administered within 1 hour of the procedure. ANESTHESIA/SEDATION: General anesthesia. CONTRAST:  Isovue 300 approximately 70 cc. FLUOROSCOPY TIME:  Fluoroscopy Time: 27 minutes 12 seconds (2951 mGy). COMPLICATIONS: None immediate. TECHNIQUE: Following a full explanation of the procedure along with the potential associated complications, an informed witnessed consent was obtained. The risks of intracranial hemorrhage of 10%, worsening neurological deficit, ventilator dependency, death and inability to revascularize were all reviewed in detail with the patient's family. The patient was then put under general anesthesia by the Department of Anesthesiology at [facility]. The right groin was prepped and draped in the usual sterile fashion. Thereafter using modified Seldinger technique, transfemoral access into the right common femoral artery was  obtained without difficulty. Over a 0.035 inch guidewire a 5 French Pinnacle sheath was inserted. Through this, and also over a 0.035 inch guidewire a 5 [place] JB 1 catheter was advanced to the aortic arch region and selectively positioned in the right common carotid artery, the right vertebral artery, the left vertebral artery and the left common carotid artery. FINDINGS: The left common carotid arteriogram demonstrates approximately 50% stenosis of the mid segment of the left common carotid artery. The left external carotid artery and its major branches are widely patent. The left internal carotid artery at the bulb demonstrates wide patency. More distally, there is mild decreased caliber of the left internal carotid artery in its entirety. The petrous, cavernous and supraclinoid segments demonstrate wide patency. The left anterior cerebral artery demonstrates complete occlusion distal to the left A1 segment. This is associated with caliber irregularity in the A1 segment. The left middle cerebral artery M1 segment proximally demonstrates mild caliber irregularity. There is patency of the superior division of the left middle cerebral artery with delayed reconstitution of the distal inferior division of the left middle cerebral artery in the M2 M3 regions. Distal to this the M3 branches and the M4 branches demonstrated gradual ascent of contrast. The inferior division appears to be completely occluded associated with a stump. A diminutive anterior temporal branch is noted. A prominent anterior choroidal artery on the left is noted with delayed arterial blush noted in the region of the splenium of the corpus callosum. The whole head delayed arterial phase in the lateral projection demonstrates a prominent  area of hypoperfusion involving the left parietal cortical and subcortical regions. The left vertebral artery origin is from the thyrocervical trunk. The vessel is seen to opacify to the cranial skull base. Wide  patency is seen of the left vertebrobasilar junction and the left posterior inferior cerebellar artery. The basilar artery, the posterior cerebral arteries, the superior cerebellar arteries and the anterior-inferior cerebellar arteries opacify into the capillary and venous phases. Prominent collaterals are seen in the P3 region of the left posterior cerebral artery retrogradely filling the posterior 1/3 of the left parietal cortical and subcortical areas. The delayed arterial phase then demonstrates retrograde opacification of the distal pericallosal artery in the splenium of the corpus callosum from the lateral thalamo perforator branches. Also demonstrated is retrograde opacification of the posterior half of the anterior temporal region from the posterior temporal branches of the P1 P2 region of the left posterior cerebral artery. The right common carotid arteriogram demonstrates mild narrowing in the mid third of the right common carotid artery. The right external carotid artery and its major branches are widely patent. The right internal carotid artery at the bulb to the cranial skull base is seen to opacify unimpeded. The petrous segment is normal. There is mild narrowing of the caval cavernous segment of the right internal carotid artery. Distal to this the supraclinoid segment demonstrates mild decrease in caliber. The right middle cerebral artery demonstrates a 50% stenosis in the distal right M1 segment. The proximal superior and the inferior divisions demonstrate moderate stenosis. Again demonstrated is a prominent anterior choroidal artery on the right side. The delayed arterial phase demonstrates hypoperfusion of the parietal cortical and subcortical regions. The right vertebral artery origin is from the right thyrocervical trunk. Again this vessel is seen to opacify to the cranial skull base. Wide patency is seen of the right vertebrobasilar junction and the right posterior-inferior cerebellar artery.  Multiple focal areas of caliber irregularity are seen involving the posterior-inferior cerebellar artery. The opacified portion of the basilar artery, the proximal posterior cerebral arteries, the superior cerebellar arteries and the anterior-inferior cerebellar arteries are seen to opacify into the capillary and venous phases. Extensive collaterals are again seen from the P3 regions of both posterior cerebral arteries contributing to the posterior parietal middle cerebral artery distributions bilaterally as described earlier. PROCEDURE: ENDOVASCULAR TREATMENT OF OCCLUDED DOMINANT INFERIOR DIVISION OF THE LEFT MIDDLE CEREBRAL ARTERY The diagnostic JB 1 catheter in the left common carotid artery was exchanged over a 0.035 inch 8 French 55 cm Brite tip neurovascular sheath using biplane roadmap technique and constant fluoroscopic guidance. Good aspiration obtained from the hub of the 8 [place] Brite tip neurovascular sheath. A gentle contrast injection demonstrated no evidence of spasms, dissections or of intraluminal filling defects. This was then connected to continuous heparinized saline infusion. Over the [facility] exchange guidewire, an 85 cm 8 [place] of balloon FlowGate guide catheter which had been prepped with 50% contrast and 50% heparinized saline infusion was advanced and positioned just proximal to the left common carotid bifurcation. The guidewire was removed. Good aspiration was obtained at the hub of the [facility] guide catheter. Over a 0.035 inch Roadrunner guidewire, using biplane roadmap technique and constant fluoroscopic guidance, the FlowGate guide catheter was then advanced to the mid cervical left ICA. The guidewire was removed. Again good aspiration was obtained from the hub of the 8 [place] FlowGate guide catheter. A gentle control arteriogram performed centered extracranially and intracranially demonstrated no change in the intracranial circulation. Over a 0.014 inch  Softip Synchro micro guidewire,  a Trevo ProVue 021 microcatheter was advanced to the distal end of the of FlowGate guide catheter. With the micro guidewire leading with a J-tip configuration, the combination was navigated to the supraclinoid left ICA. The micro guidewire was then advanced gently into the inferior division origin followed by the microcatheter. A gentle manipulation of the micro guidewire was then performed using a torque device to encourage advancement through the occluded left middle cerebral artery. The microcatheter was then brought into close proximity to the site of the occlusion. Further manipulation was performed with partial advancement of the micro guidewire through the occluded left middle cerebral artery inferior division. To avoid potential perforation of the more distal inferior division the micro guidewire was retrieved. Good aspiration obtained from the hub of the microcatheter. Approximately 3.6 mg of super selective intracranial intra-arterial Integrilin was infused over a couple of minutes. Thereafter, further attempts were made to encourage advancement of a very soft microguidewire again with only partial success. 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 The procedure was then stopped. The final control arteriogram performed through the [facility] guide catheter in the left internal carotid artery continued to demonstrate no significant change in the intracranial circulation. No angiographic evidence of extravasation or mass-effect or midline shift was seen. A Dyna CT of the head performed revealed no evidence of hemorrhage or change in the ventricular size or mass effect or midline shift. The [facility] guide catheter and the neurovascular sheath were then exchanged over a J-tip 0.035 guidewire for an 8 [place] Pinnacle sheath. This was then removed with successful hemostasis being achieved by manual compression. At the end of the procedure, the patient's right groin appeared soft without evidence  of bleeding or hematoma. Distal pulses continued to be palpable in the dorsalis pedis, and the posterior tibial regions unchanged from prior to the procedure. The patient's general anesthesia was then reversed. The patient was extubated without difficulty. Upon recovery the patient was noted to be able to raise his right arm, and also bend his right knee. He responded appropriately to simple commands. He was then transferred to the PACU and then to neuro ICU for further management. IMPRESSION: Status post attempted revascularization of occluded dominant inferior division of the right middle cerebral artery using 3.6 mg of super selective intracranial intra-arterial Integrilin and mechanical thrombolysis with a micro guidewire. PLAN: Further management as per stroke team. Electronically Signed   By: Julieanne Cotton M.D.   On: 07/06/2017 10:05   Ct Cerebral Perfusion W Contrast  Result Date: 07/05/2017 CLINICAL DATA:  Right hemiparesis and difficulty speaking. EXAM: CT ANGIOGRAPHY HEAD AND NECK CT PERFUSION BRAIN TECHNIQUE: Multidetector CT imaging of the head and neck was performed using the standard protocol during bolus administration of intravenous contrast. Multiplanar CT image reconstructions and MIPs were obtained to evaluate the vascular anatomy. Carotid stenosis measurements (when applicable) are obtained utilizing NASCET criteria, using the distal internal carotid diameter as the denominator. Multiphase CT imaging of the brain was performed following IV bolus contrast injection. Subsequent parametric perfusion maps were calculated using RAPID software. CONTRAST:  ISOVUE-370 IOPAMIDOL (ISOVUE-370) INJECTION 76% COMPARISON:  Head MRA 12/29/2013.  Neck MRA 06/30/2005 FINDINGS: CTA NECK FINDINGS Aortic arch: Standard 3 vessel aortic arch. Widely patent arch vessel origins. Right carotid system: Patent with predominantly soft plaque in the common carotid artery resulting in less than 50% narrowing.  Minimal calcified plaque at the carotid bifurcation. No ICA stenosis. Left carotid system: Patent  with predominantly soft plaque in the distal common carotid artery resulting in 50% stenosis. Moderate calcified plaque about the carotid bifurcation. No ICA stenosis. Vertebral arteries: Patent with the left being slightly dominant. Mild proximal right V2 stenosis due to calcified plaque. Calcified and soft plaque in the left V1 segment with mild multifocal stenoses. Skeleton: Mild cervical disc degeneration. Other neck: Multiple thyroid nodules including a 2.9 cm exophytic. Nodule extending inferiorly from the isthmus. Upper chest: Clear lung apices. Review of the MIP images confirms the above findings CTA HEAD FINDINGS Anterior circulation: The internal carotid arteries are patent from skull base to carotid termini with calcified plaque resulting in mild cavernous and supraclinoid stenosis on the right. The M1 segments are patent with mild stenosis on the right. The left M2 inferior division is occluded at its origin with some reconstitution of more distal M2 and M3 branches, however there are numerous missing distal branches in the left parietal region corresponding to the acute infarct. The prior MRA also suggested a severe stenosis or possibly short segment occlusion of the left M2 inferior division, however motion artifact on that study makes it difficult to make a detailed assessment of interval changes. There are also likely some missing distal left MCA superior division branch vessels. There also severe proximal right M2 stenoses which are at least partially chronic. The right A1 segment is occluded. The left A1 segment is patent but demonstrates a severe proximal stenosis. Both A2 segments are heavily diseased with multifocal severe stenoses and or short segment occlusions. The appearance of the ACAs is grossly similar to the prior motion degraded MRA. No aneurysm. Posterior circulation: The intracranial  vertebral arteries are patent to the basilar. There is right greater than left V4 segment calcified plaque without significant stenosis. Patent SCA origins are identified bilaterally. PICA and AICA origins are not well evaluated. The basilar artery is widely patent. Posterior communicating arteries are diminutive or absent. PCAs are patent with mild left P2 stenosis. No aneurysm. Venous sinuses: Inadequately assessed due to arterial phase contrast timing. Anatomic variants: None. Delayed phase: Not performed. Review of the MIP images confirms the above findings CT Brain Perfusion Findings: CBF (<30%) Volume: 28mL Perfusion (Tmax>6.0s) volume: , however a substantial amount of this is located in the right cerebral hemisphere Mismatch Volume: though with the caveat that this is artifactually inflated due to (likely chronic) prolonged perfusion in the right cerebral hemisphere. There is still however an apparent sizable penumbra in the posterior left cerebral hemisphere. Infarction Location:Predominantly left parietal lobe with a small amount of posterior left frontal lobe, posterior left temporal lobe, and posterior insula involvement. IMPRESSION: 1. 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 2. Left M2 inferior division occlusion at its origin with some distal reconstitution. Severe stenosis (or possibly short segment occlusion) in this location on a prior MRA. Numerous missing distal branch vessels in the left parietal region corresponding to the acute infarct. 3. Advanced intracranial atherosclerosis with numerous severe stenoses as above. 4. 50% left common carotid artery stenosis. 5.  Aortic Atherosclerosis (ICD10-I70.0). 6. Thyroid nodules measuring up to 2.9 cm. Nonemergent thyroid ultrasound recommended for further evaluation. These results were communicated to Dr. Amada Jupiter at 10:18 pm on 07/05/2017 by text page via the [facility] messaging system. Electronically Signed   By: Sebastian Ache M.D.   On: 07/05/2017 22:47   Dg Chest Port 1 View  Result Date: 07/06/2017 CLINICAL DATA:  Cardiomyopathy, coronary artery disease with stent placement, chronic renal insufficiency. EXAM:  PORTABLE CHEST 1 VIEW COMPARISON:  Chest x-ray of March 04, 2017 FINDINGS: The lungs are borderline hypoinflated but clear. The heart is top-normal in size. The pulmonary vascularity is normal. There is no pleural effusion. There are post CABG changes. There is a vascular graft in the left axillary region. The bony thorax exhibits no acute abnormality. IMPRESSION: Top-normal cardiac size without pulmonary edema or pleural effusion. No pneumonia. Electronically Signed   By: David  [place] M.D.   On: 07/06/2017 07:57   Ir Percutaneous Art Thrombectomy/infusion Intracranial Inc Diag Angio  Result Date: 07/07/2017 INDICATION: Flaccid right and arm leg. Left gaze deviation. Dysarthria. Occluded inferior division of the left middle cerebral artery. EXAM: 1. EMERGENT LARGE VESSEL OCCLUSION THROMBOLYSIS (anterior CIRCULATION) COMPARISON:  CT angiogram of the head and neck of 07/05/2017. MEDICATIONS: Ancef 2 g IV antibiotic was administered within 1 hour of the procedure. ANESTHESIA/SEDATION: General anesthesia. CONTRAST:  Isovue 300 approximately 70 cc. FLUOROSCOPY TIME:  Fluoroscopy Time: 27 minutes 12 seconds (2951 mGy). COMPLICATIONS: None immediate. TECHNIQUE: Following a full explanation of the procedure along with the potential associated complications, an informed witnessed consent was obtained. The risks of intracranial hemorrhage of 10%, worsening neurological deficit, ventilator dependency, death and inability to revascularize were all reviewed in detail with the patient's family. The patient was then put under general anesthesia by the Department of Anesthesiology at [facility]. The right groin was prepped and draped in the usual sterile fashion. Thereafter using modified Seldinger technique,  transfemoral access into the right common femoral artery was obtained without difficulty. Over a 0.035 inch guidewire a 5 French Pinnacle sheath was inserted. Through this, and also over a 0.035 inch guidewire a 5 [place] JB 1 catheter was advanced to the aortic arch region and selectively positioned in the right common carotid artery, the right vertebral artery, the left vertebral artery and the left common carotid artery. FINDINGS: The left common carotid arteriogram demonstrates approximately 50% stenosis of the mid segment of the left common carotid artery. The left external carotid artery and its major branches are widely patent. The left internal carotid artery at the bulb demonstrates wide patency. More distally, there is mild decreased caliber of the left internal carotid artery in its entirety. The petrous, cavernous and supraclinoid segments demonstrate wide patency. The left anterior cerebral artery demonstrates complete occlusion distal to the left A1 segment. This is associated with caliber irregularity in the A1 segment. The left middle cerebral artery M1 segment proximally demonstrates mild caliber irregularity. There is patency of the superior division of the left middle cerebral artery with delayed reconstitution of the distal inferior division of the left middle cerebral artery in the M2 M3 regions. Distal to this the M3 branches and the M4 branches demonstrated gradual ascent of contrast. The inferior division appears to be completely occluded associated with a stump. A diminutive anterior temporal branch is noted. A prominent anterior choroidal artery on the left is noted with delayed arterial blush noted in the region of the splenium of the corpus callosum. The whole head delayed arterial phase in the lateral projection demonstrates a prominent area of hypoperfusion involving the left parietal cortical and subcortical regions. The left vertebral artery origin is from the thyrocervical trunk. The  vessel is seen to opacify to the cranial skull base. Wide patency is seen of the left vertebrobasilar junction and the left posterior inferior cerebellar artery. The basilar artery, the posterior cerebral arteries, the superior cerebellar arteries and the anterior-inferior cerebellar arteries opacify into  the capillary and venous phases. Prominent collaterals are seen in the P3 region of the left posterior cerebral artery retrogradely filling the posterior 1/3 of the left parietal cortical and subcortical areas. The delayed arterial phase then demonstrates retrograde opacification of the distal pericallosal artery in the splenium of the corpus callosum from the lateral thalamo perforator branches. Also demonstrated is retrograde opacification of the posterior half of the anterior temporal region from the posterior temporal branches of the P1 P2 region of the left posterior cerebral artery. The right common carotid arteriogram demonstrates mild narrowing in the mid third of the right common carotid artery. The right external carotid artery and its major branches are widely patent. The right internal carotid artery at the bulb to the cranial skull base is seen to opacify unimpeded. The petrous segment is normal. There is mild narrowing of the caval cavernous segment of the right internal carotid artery. Distal to this the supraclinoid segment demonstrates mild decrease in caliber. The right middle cerebral artery demonstrates a 50% stenosis in the distal right M1 segment. The proximal superior and the inferior divisions demonstrate moderate stenosis. Again demonstrated is a prominent anterior choroidal artery on the right side. The delayed arterial phase demonstrates hypoperfusion of the parietal cortical and subcortical regions. The right vertebral artery origin is from the right thyrocervical trunk. Again this vessel is seen to opacify to the cranial skull base. Wide patency is seen of the right vertebrobasilar  junction and the right posterior-inferior cerebellar artery. Multiple focal areas of caliber irregularity are seen involving the posterior-inferior cerebellar artery. The opacified portion of the basilar artery, the proximal posterior cerebral arteries, the superior cerebellar arteries and the anterior-inferior cerebellar arteries are seen to opacify into the capillary and venous phases. Extensive collaterals are again seen from the P3 regions of both posterior cerebral arteries contributing to the posterior parietal middle cerebral artery distributions bilaterally as described earlier. PROCEDURE: ENDOVASCULAR TREATMENT OF OCCLUDED DOMINANT INFERIOR DIVISION OF THE LEFT MIDDLE CEREBRAL ARTERY The diagnostic JB 1 catheter in the left common carotid artery was exchanged over a 0.035 inch 8 French 55 cm Brite tip neurovascular sheath using biplane roadmap technique and constant fluoroscopic guidance. Good aspiration obtained from the hub of the 8 [place] Brite tip neurovascular sheath. A gentle contrast injection demonstrated no evidence of spasms, dissections or of intraluminal filling defects. This was then connected to continuous heparinized saline infusion. Over the [facility] exchange guidewire, an 85 cm 8 [place] of balloon FlowGate guide catheter which had been prepped with 50% contrast and 50% heparinized saline infusion was advanced and positioned just proximal to the left common carotid bifurcation. The guidewire was removed. Good aspiration was obtained at the hub of the [facility] guide catheter. Over a 0.035 inch Roadrunner guidewire, using biplane roadmap technique and constant fluoroscopic guidance, the FlowGate guide catheter was then advanced to the mid cervical left ICA. The guidewire was removed. Again good aspiration was obtained from the hub of the 8 [place] FlowGate guide catheter. A gentle control arteriogram performed centered extracranially and intracranially demonstrated no change in the intracranial  circulation. Over a 0.014 inch Softip Synchro micro guidewire, a Trevo ProVue 021 microcatheter was advanced to the distal end of the of FlowGate guide catheter. With the micro guidewire leading with a J-tip configuration, the combination was navigated to the supraclinoid left ICA. The micro guidewire was then advanced gently into the inferior division origin followed by the microcatheter. A gentle manipulation of the micro guidewire was then performed using  a torque device to encourage advancement through the occluded left middle cerebral artery. The microcatheter was then brought into close proximity to the site of the occlusion. Further manipulation was performed with partial advancement of the micro guidewire through the occluded left middle cerebral artery inferior division. To avoid potential perforation of the more distal inferior division the micro guidewire was retrieved. Good aspiration obtained from the hub of the microcatheter. Approximately 3.6 mg of super selective intracranial intra-arterial Integrilin was infused over a couple of minutes. Thereafter, further attempts were made to encourage advancement of a very soft microguidewire again with only partial success. 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 The procedure was then stopped. The final control arteriogram performed through the [facility] guide catheter in the left internal carotid artery continued to demonstrate no significant change in the intracranial circulation. No angiographic evidence of extravasation or mass-effect or midline shift was seen. A Dyna CT of the head performed revealed no evidence of hemorrhage or change in the ventricular size or mass effect or midline shift. The [facility] guide catheter and the neurovascular sheath were then exchanged over a J-tip 0.035 guidewire for an 8 [place] Pinnacle sheath. This was then removed with successful hemostasis being achieved by manual compression. At the end of the  procedure, the patient's right groin appeared soft without evidence of bleeding or hematoma. Distal pulses continued to be palpable in the dorsalis pedis, and the posterior tibial regions unchanged from prior to the procedure. The patient's general anesthesia was then reversed. The patient was extubated without difficulty. Upon recovery the patient was noted to be able to raise his right arm, and also bend his right knee. He responded appropriately to simple commands. He was then transferred to the PACU and then to neuro ICU for further management. IMPRESSION: Status post attempted revascularization of occluded dominant inferior division of the right middle cerebral artery using 3.6 mg of super selective intracranial intra-arterial Integrilin and mechanical thrombolysis with a micro guidewire. PLAN: Further management as per stroke team. Electronically Signed   By: Julieanne Cotton M.D.   On: 07/06/2017 10:05   Ct Head Code Stroke Wo Contrast  Result Date: 07/05/2017 CLINICAL DATA:  Code stroke. Right hemiparesis and difficulty speaking. EXAM: CT HEAD WITHOUT CONTRAST TECHNIQUE: Contiguous axial images were obtained from the base of the skull through the vertex without intravenous contrast. COMPARISON:  Brain MRI 04/27/2016 FINDINGS: Brain: There is loss of gray-white differentiation consistent with acute infarction involving the posterior left insula and left parietal lobe extending towards the vertex. No acute intracranial hemorrhage, mass, midline shift, or extra-axial fluid collection is identified. Chronic lacunar infarcts are present in the basal ganglia and thalami bilaterally. There is a chronic right temporoparietal infarct. There is a small chronic left cerebellar infarct. Patchy cerebral white matter hypodensities are nonspecific but compatible with chronic small vessel ischemic disease, moderately advanced for age. Mild cerebral atrophy is advanced for age. Vascular: Calcified atherosclerosis at  the skull base. No hyperdense vessel. Skull: No fracture or focal osseous lesion. Sinuses/Orbits: Visualized paranasal sinuses and mastoid air cells are clear. Orbits are unremarkable. Other: None. ASPECTS [facility] Stroke Program Early CT Score) - Ganglionic level infarction (caudate, lentiform nuclei, internal capsule, insula, M1-M3 cortex): 5-6 - Supraganglionic infarction (M4-M6 cortex): 2 Total score (0-10 with 10 being normal): 7-8 IMPRESSION: 1. Acute left MCA infarct involving the insula and parietal lobe. No hemorrhage. 2. ASPECTS is 7-8. 3. Age advanced chronic ischemia. These results were communicated to Dr.  Kirkpatrick at 9:54 pm on 07/05/2017 by text page via the [facility] messaging system. Electronically Signed   By: Sebastian Ache M.D.   On: 07/05/2017 21:59   Ir Angio Intra Extracran Sel Com Carotid Innominate Uni R Mod Sed  Result Date: 07/07/2017 INDICATION: Flaccid right and arm leg. Left gaze deviation. Dysarthria. Occluded inferior division of the left middle cerebral artery. EXAM: 1. EMERGENT LARGE VESSEL OCCLUSION THROMBOLYSIS (anterior CIRCULATION) COMPARISON:  CT angiogram of the head and neck of 07/05/2017. MEDICATIONS: Ancef 2 g IV antibiotic was administered within 1 hour of the procedure. ANESTHESIA/SEDATION: General anesthesia. CONTRAST:  Isovue 300 approximately 70 cc. FLUOROSCOPY TIME:  Fluoroscopy Time: 27 minutes 12 seconds (2951 mGy). COMPLICATIONS: None immediate. TECHNIQUE: Following a full explanation of the procedure along with the potential associated complications, an informed witnessed consent was obtained. The risks of intracranial hemorrhage of 10%, worsening neurological deficit, ventilator dependency, death and inability to revascularize were all reviewed in detail with the patient's family. The patient was then put under general anesthesia by the Department of Anesthesiology at [facility]. The right groin was prepped and draped in the usual sterile fashion.  Thereafter using modified Seldinger technique, transfemoral access into the right common femoral artery was obtained without difficulty. Over a 0.035 inch guidewire a 5 French Pinnacle sheath was inserted. Through this, and also over a 0.035 inch guidewire a 5 [place] JB 1 catheter was advanced to the aortic arch region and selectively positioned in the right common carotid artery, the right vertebral artery, the left vertebral artery and the left common carotid artery. FINDINGS: The left common carotid arteriogram demonstrates approximately 50% stenosis of the mid segment of the left common carotid artery. The left external carotid artery and its major branches are widely patent. The left internal carotid artery at the bulb demonstrates wide patency. More distally, there is mild decreased caliber of the left internal carotid artery in its entirety. The petrous, cavernous and supraclinoid segments demonstrate wide patency. The left anterior cerebral artery demonstrates complete occlusion distal to the left A1 segment. This is associated with caliber irregularity in the A1 segment. The left middle cerebral artery M1 segment proximally demonstrates mild caliber irregularity. There is patency of the superior division of the left middle cerebral artery with delayed reconstitution of the distal inferior division of the left middle cerebral artery in the M2 M3 regions. Distal to this the M3 branches and the M4 branches demonstrated gradual ascent of contrast. The inferior division appears to be completely occluded associated with a stump. A diminutive anterior temporal branch is noted. A prominent anterior choroidal artery on the left is noted with delayed arterial blush noted in the region of the splenium of the corpus callosum. The whole head delayed arterial phase in the lateral projection demonstrates a prominent area of hypoperfusion involving the left parietal cortical and subcortical regions. The left vertebral  artery origin is from the thyrocervical trunk. The vessel is seen to opacify to the cranial skull base. Wide patency is seen of the left vertebrobasilar junction and the left posterior inferior cerebellar artery. The basilar artery, the posterior cerebral arteries, the superior cerebellar arteries and the anterior-inferior cerebellar arteries opacify into the capillary and venous phases. Prominent collaterals are seen in the P3 region of the left posterior cerebral artery retrogradely filling the posterior 1/3 of the left parietal cortical and subcortical areas. The delayed arterial phase then demonstrates retrograde opacification of the distal pericallosal artery in the splenium of the corpus callosum  from the lateral thalamo perforator branches. Also demonstrated is retrograde opacification of the posterior half of the anterior temporal region from the posterior temporal branches of the P1 P2 region of the left posterior cerebral artery. The right common carotid arteriogram demonstrates mild narrowing in the mid third of the right common carotid artery. The right external carotid artery and its major branches are widely patent. The right internal carotid artery at the bulb to the cranial skull base is seen to opacify unimpeded. The petrous segment is normal. There is mild narrowing of the caval cavernous segment of the right internal carotid artery. Distal to this the supraclinoid segment demonstrates mild decrease in caliber. The right middle cerebral artery demonstrates a 50% stenosis in the distal right M1 segment. The proximal superior and the inferior divisions demonstrate moderate stenosis. Again demonstrated is a prominent anterior choroidal artery on the right side. The delayed arterial phase demonstrates hypoperfusion of the parietal cortical and subcortical regions. The right vertebral artery origin is from the right thyrocervical trunk. Again this vessel is seen to opacify to the cranial skull base.  Wide patency is seen of the right vertebrobasilar junction and the right posterior-inferior cerebellar artery. Multiple focal areas of caliber irregularity are seen involving the posterior-inferior cerebellar artery. The opacified portion of the basilar artery, the proximal posterior cerebral arteries, the superior cerebellar arteries and the anterior-inferior cerebellar arteries are seen to opacify into the capillary and venous phases. Extensive collaterals are again seen from the P3 regions of both posterior cerebral arteries contributing to the posterior parietal middle cerebral artery distributions bilaterally as described earlier. PROCEDURE: ENDOVASCULAR TREATMENT OF OCCLUDED DOMINANT INFERIOR DIVISION OF THE LEFT MIDDLE CEREBRAL ARTERY The diagnostic JB 1 catheter in the left common carotid artery was exchanged over a 0.035 inch 8 French 55 cm Brite tip neurovascular sheath using biplane roadmap technique and constant fluoroscopic guidance. Good aspiration obtained from the hub of the 8 [place] Brite tip neurovascular sheath. A gentle contrast injection demonstrated no evidence of spasms, dissections or of intraluminal filling defects. This was then connected to continuous heparinized saline infusion. Over the [facility] exchange guidewire, an 85 cm 8 [place] of balloon FlowGate guide catheter which had been prepped with 50% contrast and 50% heparinized saline infusion was advanced and positioned just proximal to the left common carotid bifurcation. The guidewire was removed. Good aspiration was obtained at the hub of the [facility] guide catheter. Over a 0.035 inch Roadrunner guidewire, using biplane roadmap technique and constant fluoroscopic guidance, the FlowGate guide catheter was then advanced to the mid cervical left ICA. The guidewire was removed. Again good aspiration was obtained from the hub of the 8 [place] FlowGate guide catheter. A gentle control arteriogram performed centered extracranially and  intracranially demonstrated no change in the intracranial circulation. Over a 0.014 inch Softip Synchro micro guidewire, a Trevo ProVue 021 microcatheter was advanced to the distal end of the of FlowGate guide catheter. With the micro guidewire leading with a J-tip configuration, the combination was navigated to the supraclinoid left ICA. The micro guidewire was then advanced gently into the inferior division origin followed by the microcatheter. A gentle manipulation of the micro guidewire was then performed using a torque device to encourage advancement through the occluded left middle cerebral artery. The microcatheter was then brought into close proximity to the site of the occlusion. Further manipulation was performed with partial advancement of the micro guidewire through the occluded left middle cerebral artery inferior division. To avoid potential perforation of  the more distal inferior division the micro guidewire was retrieved. Good aspiration obtained from the hub of the microcatheter. Approximately 3.6 mg of super selective intracranial intra-arterial Integrilin was infused over a couple of minutes. Thereafter, further attempts were made to encourage advancement of a very soft microguidewire again with only partial success. 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 The procedure was then stopped. The final control arteriogram performed through the [facility] guide catheter in the left internal carotid artery continued to demonstrate no significant change in the intracranial circulation. No angiographic evidence of extravasation or mass-effect or midline shift was seen. A Dyna CT of the head performed revealed no evidence of hemorrhage or change in the ventricular size or mass effect or midline shift. The [facility] guide catheter and the neurovascular sheath were then exchanged over a J-tip 0.035 guidewire for an 8 [place] Pinnacle sheath. This was then removed with successful hemostasis  being achieved by manual compression. At the end of the procedure, the patient's right groin appeared soft without evidence of bleeding or hematoma. Distal pulses continued to be palpable in the dorsalis pedis, and the posterior tibial regions unchanged from prior to the procedure. The patient's general anesthesia was then reversed. The patient was extubated without difficulty. Upon recovery the patient was noted to be able to raise his right arm, and also bend his right knee. He responded appropriately to simple commands. He was then transferred to the PACU and then to neuro ICU for further management. IMPRESSION: Status post attempted revascularization of occluded dominant inferior division of the right middle cerebral artery using 3.6 mg of super selective intracranial intra-arterial Integrilin and mechanical thrombolysis with a micro guidewire. PLAN: Further management as per stroke team. Electronically Signed   By: Julieanne Cotton M.D.   On: 07/06/2017 10:05   Ir Angio Vertebral Sel Vertebral Bilat Mod Sed  Result Date: 07/07/2017 INDICATION: Flaccid right and arm leg. Left gaze deviation. Dysarthria. Occluded inferior division of the left middle cerebral artery. EXAM: 1. EMERGENT LARGE VESSEL OCCLUSION THROMBOLYSIS (anterior CIRCULATION) COMPARISON:  CT angiogram of the head and neck of 07/05/2017. MEDICATIONS: Ancef 2 g IV antibiotic was administered within 1 hour of the procedure. ANESTHESIA/SEDATION: General anesthesia. CONTRAST:  Isovue 300 approximately 70 cc. FLUOROSCOPY TIME:  Fluoroscopy Time: 27 minutes 12 seconds (2951 mGy). COMPLICATIONS: None immediate. TECHNIQUE: Following a full explanation of the procedure along with the potential associated complications, an informed witnessed consent was obtained. The risks of intracranial hemorrhage of 10%, worsening neurological deficit, ventilator dependency, death and inability to revascularize were all reviewed in detail with the patient's family.  The patient was then put under general anesthesia by the Department of Anesthesiology at [facility]. The right groin was prepped and draped in the usual sterile fashion. Thereafter using modified Seldinger technique, transfemoral access into the right common femoral artery was obtained without difficulty. Over a 0.035 inch guidewire a 5 French Pinnacle sheath was inserted. Through this, and also over a 0.035 inch guidewire a 5 [place] JB 1 catheter was advanced to the aortic arch region and selectively positioned in the right common carotid artery, the right vertebral artery, the left vertebral artery and the left common carotid artery. FINDINGS: The left common carotid arteriogram demonstrates approximately 50% stenosis of the mid segment of the left common carotid artery. The left external carotid artery and its major branches are widely patent. The left internal carotid artery at the bulb demonstrates wide patency. More distally, there is mild  decreased caliber of the left internal carotid artery in its entirety. The petrous, cavernous and supraclinoid segments demonstrate wide patency. The left anterior cerebral artery demonstrates complete occlusion distal to the left A1 segment. This is associated with caliber irregularity in the A1 segment. The left middle cerebral artery M1 segment proximally demonstrates mild caliber irregularity. There is patency of the superior division of the left middle cerebral artery with delayed reconstitution of the distal inferior division of the left middle cerebral artery in the M2 M3 regions. Distal to this the M3 branches and the M4 branches demonstrated gradual ascent of contrast. The inferior division appears to be completely occluded associated with a stump. A diminutive anterior temporal branch is noted. A prominent anterior choroidal artery on the left is noted with delayed arterial blush noted in the region of the splenium of the corpus callosum. The whole head  delayed arterial phase in the lateral projection demonstrates a prominent area of hypoperfusion involving the left parietal cortical and subcortical regions. The left vertebral artery origin is from the thyrocervical trunk. The vessel is seen to opacify to the cranial skull base. Wide patency is seen of the left vertebrobasilar junction and the left posterior inferior cerebellar artery. The basilar artery, the posterior cerebral arteries, the superior cerebellar arteries and the anterior-inferior cerebellar arteries opacify into the capillary and venous phases. Prominent collaterals are seen in the P3 region of the left posterior cerebral artery retrogradely filling the posterior 1/3 of the left parietal cortical and subcortical areas. The delayed arterial phase then demonstrates retrograde opacification of the distal pericallosal artery in the splenium of the corpus callosum from the lateral thalamo perforator branches. Also demonstrated is retrograde opacification of the posterior half of the anterior temporal region from the posterior temporal branches of the P1 P2 region of the left posterior cerebral artery. The right common carotid arteriogram demonstrates mild narrowing in the mid third of the right common carotid artery. The right external carotid artery and its major branches are widely patent. The right internal carotid artery at the bulb to the cranial skull base is seen to opacify unimpeded. The petrous segment is normal. There is mild narrowing of the caval cavernous segment of the right internal carotid artery. Distal to this the supraclinoid segment demonstrates mild decrease in caliber. The right middle cerebral artery demonstrates a 50% stenosis in the distal right M1 segment. The proximal superior and the inferior divisions demonstrate moderate stenosis. Again demonstrated is a prominent anterior choroidal artery on the right side. The delayed arterial phase demonstrates hypoperfusion of the  parietal cortical and subcortical regions. The right vertebral artery origin is from the right thyrocervical trunk. Again this vessel is seen to opacify to the cranial skull base. Wide patency is seen of the right vertebrobasilar junction and the right posterior-inferior cerebellar artery. Multiple focal areas of caliber irregularity are seen involving the posterior-inferior cerebellar artery. The opacified portion of the basilar artery, the proximal posterior cerebral arteries, the superior cerebellar arteries and the anterior-inferior cerebellar arteries are seen to opacify into the capillary and venous phases. Extensive collaterals are again seen from the P3 regions of both posterior cerebral arteries contributing to the posterior parietal middle cerebral artery distributions bilaterally as described earlier. PROCEDURE: ENDOVASCULAR TREATMENT OF OCCLUDED DOMINANT INFERIOR DIVISION OF THE LEFT MIDDLE CEREBRAL ARTERY The diagnostic JB 1 catheter in the left common carotid artery was exchanged over a 0.035 inch 8 French 55 cm Brite tip neurovascular sheath using biplane roadmap technique and constant fluoroscopic  guidance. Good aspiration obtained from the hub of the 8 [place] Brite tip neurovascular sheath. A gentle contrast injection demonstrated no evidence of spasms, dissections or of intraluminal filling defects. This was then connected to continuous heparinized saline infusion. Over the [facility] exchange guidewire, an 85 cm 8 [place] of balloon FlowGate guide catheter which had been prepped with 50% contrast and 50% heparinized saline infusion was advanced and positioned just proximal to the left common carotid bifurcation. The guidewire was removed. Good aspiration was obtained at the hub of the [facility] guide catheter. Over a 0.035 inch Roadrunner guidewire, using biplane roadmap technique and constant fluoroscopic guidance, the FlowGate guide catheter was then advanced to the mid cervical left ICA. The guidewire  was removed. Again good aspiration was obtained from the hub of the 8 [place] FlowGate guide catheter. A gentle control arteriogram performed centered extracranially and intracranially demonstrated no change in the intracranial circulation. Over a 0.014 inch Softip Synchro micro guidewire, a Trevo ProVue 021 microcatheter was advanced to the distal end of the of FlowGate guide catheter. With the micro guidewire leading with a J-tip configuration, the combination was navigated to the supraclinoid left ICA. The micro guidewire was then advanced gently into the inferior division origin followed by the microcatheter. A gentle manipulation of the micro guidewire was then performed using a torque device to encourage advancement through the occluded left middle cerebral artery. The microcatheter was then brought into close proximity to the site of the occlusion. Further manipulation was performed with partial advancement of the micro guidewire through the occluded left middle cerebral artery inferior division. To avoid potential perforation of the more distal inferior division the micro guidewire was retrieved. Good aspiration obtained from the hub of the microcatheter. Approximately 3.6 mg of super selective intracranial intra-arterial Integrilin was infused over a couple of minutes. Thereafter, further attempts were made to encourage advancement of a very soft microguidewire again with only partial success. 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 The procedure was then stopped. The final control arteriogram performed through the [facility] guide catheter in the left internal carotid artery continued to demonstrate no significant change in the intracranial circulation. No angiographic evidence of extravasation or mass-effect or midline shift was seen. A Dyna CT of the head performed revealed no evidence of hemorrhage or change in the ventricular size or mass effect or midline shift. The [facility] guide  catheter and the neurovascular sheath were then exchanged over a J-tip 0.035 guidewire for an 8 [place] Pinnacle sheath. This was then removed with successful hemostasis being achieved by manual compression. At the end of the procedure, the patient's right groin appeared soft without evidence of bleeding or hematoma. Distal pulses continued to be palpable in the dorsalis pedis, and the posterior tibial regions unchanged from prior to the procedure. The patient's general anesthesia was then reversed. The patient was extubated without difficulty. Upon recovery the patient was noted to be able to raise his right arm, and also bend his right knee. He responded appropriately to simple commands. He was then transferred to the PACU and then to neuro ICU for further management. IMPRESSION: Status post attempted revascularization of occluded dominant inferior division of the right middle cerebral artery using 3.6 mg of super selective intracranial intra-arterial Integrilin and mechanical thrombolysis with a micro guidewire. PLAN: Further management as per stroke team. Electronically Signed   By: Julieanne Cotton M.D.   On: 07/06/2017 10:05   TTE  - Left ventricle: Wall thickness was  increased in a pattern of mild   LVH. Systolic function was mildly to moderately reduced. The   estimated ejection fraction was in the range of 40% to 45%.   Akinesis of the apicalanteroseptal, anterolateral, and apical   myocardium. Doppler parameters are consistent with abnormal left   ventricular relaxation (grade 1 diastolic dysfunction). - Aortic valve: There was mild regurgitation.   PHYSICAL EXAM  Temp:  [97.2 F (36.2 C)-98.5 F (36.9 C)] 98.1 F (36.7 C) (04/27 0800) Pulse Rate:  [24-114] 114 (04/27 0700) Resp:  [14-25] 18 (04/27 0805) BP: (114-179)/(68-142) 155/102 (04/27 0805) SpO2:  [97 %-100 %] 99 % (04/27 0805)  General - Well nourished, well developed, in no apparent distress.  Cardiovascular - pulse and  heart tones are reg today. Tele reviewed at bedside is NSR.  Neuro - awake alert, global aphasia, able to say "yes" and "OK" unreliably to all questions. Mimics some, but not all gestures.Keeps showing a thumb up and points finger on the left, but not on command. Did not follow any simple commands.  Gaze conjugate this am, crosses midline and tracks me in room to both sides,attending to right side. Blinking to visual threat on the left, however not blinking to the right.  PERRL, moderate right facial droop.  Tongue midline.  Right UE flaccid even on pain stimulation. RLE 1/5 on pain summation. LUE and LLE purposeful movement and against gravity.  DTR 1+ and no Babinski. Sensation is decreased to pain stimuli on RUE, coordination not cooperative and gait no able to test.   ASSESSMENT/PLAN [name] is a 57 y.o. male with history of CAD/MI s/p stenting and CABG, known cardiomyopathy with EF 35-40% (now improved on current TTE), hypertension, hyperlipidemia, cognitive impairment, obesity, seizure, OSA, ESRD on hemodialysis admitted for AMS and right-sided weakness. No tPA given due to outside window. S/p IA thrombectomy on 4/21  NSTEMI and Arrythmias  Hx of CAD/MI - s/p stenting and CABG in past  Arrythmia and hypertension on evening of 4/24  EKG showed PVCs, VT, then on 4/25 remained in sbrady with bigeminy PACs. Today seems to be NSR, few PVS  Troponin 0.62->11.47 (last done on 4/25, ?peak)  K 3.4->4.0, replace as needed  Cardiology recommends long term anticoagulation, currently on Heparin gtt for 48h per NSTEMI protocol  Currently on ASA as well (was on DAPT for intracranial stenosis)  Stroke:  left MCA infarct, likely etiology is progressive large vessel atherosclerosis.  Chronic intracranial stenosis including bilateral MCAs since 2015.   Resultant global aphasia, right hemiplegia, ongoing rehab efforts needed. Recovery will likely be incomplete despite best rehab efforts.    CT left MCA infarct  CTA head and neck left CCA 50% stenosis left M2 cutoff and diffuse intracranial stenosis. Out pt f/u with Vasc Sx recommended.   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  LDL 131  HgbA1c 4.6  SCDs for VTE prophylaxis Fall precautions  DIET - DYS 1 Room service appropriate? Yes; Fluid consistency: Thin   DAPT was started, and this would be indicated indefinitely for this chronic intracranial stenosis, however if long term anticoagulation is indicated from cardio standpoint, then would favor ASA 81mg  only in addition  to anticoag.  Plavix d/c on 4/26  Ongoing aggressive stroke risk factor management, stroke education when/if able  Therapy recommendations: SNF vs CIR  Disposition: Pending  History of stroke  12/2013, admitted for hypotension after HD, MRI showed left caudate head acute infarct, chronic bilateral BG and pontine infarcts.  MRA severe ICAD involving bilateral MCAs.  Carotid Doppler negative.  LDL 89 and A1c 5.6.  Discharged with dual antiplatelet and Crestor 40  Follows with Dr. Terrace Arabia at [facility]  CHF with cardiomyopathy  EF 35 to 40% in 02/2017  TTE EF 40-45% this time- ?thrombus w/akinesis  2/24 pm event with tachy and [facility]  Cardiology consulted, following  On Heparin gtt for now, then poss transition to anticoagulation? Need to clarify with Cards for long term plan  ESRD on HD  Nephrology on board  HD scheduled MWF PTA  Plan to HD today  Cre 13.49->10.40 ->9.17->12.24->7.71  Hypertension / hypotension  Off Cardene now  Low BP noted during HD; avoid hypotension. No further role in augmentation of bp  Now on metoprolol 12.5mg   bid  Hydralazine IV PRN if there is bradycardia  SBP goal given severe intracranial stenosis: 120-150s   Hyperlipidemia  Home meds: Lipitor 40, increased to max dose 80mg  qhs  LDL 131, goal < 70  Other Stroke Risk Factors  Obstructive sleep apnea, on CPAP at home  Other Active Problems  Cognitive impairment - following with Dr. Terrace Arabia at [facility], continue home meds  ?? Seizure, not on meds  Difficulty IV access  Hospital day # 8  - Improved and stable with no further arrhythmias, ok to transfer to neuro floor now w/tele -Cardiology now recommending long term anticoagulation  -Continue heparin gtt per cardiology, then transition to [facility] of their choice -Continue ASA for intracranial stenosis (no role in triple therapy w/OAC + ASA+ Plavix) -d/c Plavix on 4/26, continue on 81mg  ASA only -This patient is stabilizing post NSTEMI, but patient's care continues to require constant monitoring of vital signs, hemodynamics, respiratory and cardiac monitoring, review of multiple databases, neurological assessment, discussion with family, other specialists and medical decision making of high complexity. -Seen with RN. No family present to updated. I spent 35 minutes of neurocritical care time in the care of this patient. I discussed with Dr. Desmond Lope, attending neurologist  [name], ARNP Stroke Neurology 07/11/2017 11:53 AM   To contact Stroke Continuity provider, please refer to [email or web address]. After hours, contact General Neurology

## 2017-07-11 NOTE — Progress Notes (Signed)
ANTICOAGULATION CONSULT NOTE  Pharmacy Consult:  Heparin Indication: chest pain/ACS  Heparin Dosing Weight: 86.8 kg  Labs: Recent Labs    07/08/17 2100 07/09/17 0447 07/09/17 0750  07/10/17 0403 07/10/17 1438 07/11/17 0613  HGB 13.1 13.0  --   --  12.7*  --  13.6  HCT 38.9* 39.6  --   --  38.3*  --  40.4  PLT 229 169  --   --  250  --  297  HEPARINUNFRC  --   --   --    < > 0.28* <0.10* 0.57  CREATININE 11.20*  --  12.24*  --  7.71*  --   --   TROPONINI 0.62*  --  11.47*  --   --   --   --    < > = values in this interval not displayed.    Assessment: 17  Male with recent CVA and elevated troponins for heparin  Goal of Therapy:  Heparin level 0.3-0.5 units/ml, no boluses Monitor platelets by anticoagulation protocol: Yes     Plan:  Decrease Heparin 1000 units/hr  Geannie Risen, PharmD, BCPS

## 2017-07-12 LAB — CBC
HCT: 33.8 % — ABNORMAL LOW (ref 39.0–52.0)
Hemoglobin: 11.2 g/dL — ABNORMAL LOW (ref 13.0–17.0)
MCH: 30.7 pg (ref 26.0–34.0)
MCHC: 33.1 g/dL (ref 30.0–36.0)
MCV: 92.6 fL (ref 78.0–100.0)
Platelets: 263 10*3/uL (ref 150–400)
RBC: 3.65 MIL/uL — ABNORMAL LOW (ref 4.22–5.81)
RDW: 14.4 % (ref 11.5–15.5)
WBC: 10.1 10*3/uL (ref 4.0–10.5)

## 2017-07-12 LAB — GLUCOSE, CAPILLARY
GLUCOSE-CAPILLARY: 101 mg/dL — AB (ref 65–99)
GLUCOSE-CAPILLARY: 102 mg/dL — AB (ref 65–99)
Glucose-Capillary: 93 mg/dL (ref 65–99)

## 2017-07-12 LAB — RENAL FUNCTION PANEL
Albumin: 3 g/dL — ABNORMAL LOW (ref 3.5–5.0)
Anion gap: 12 (ref 5–15)
BUN: 44 mg/dL — ABNORMAL HIGH (ref 6–20)
CO2: 26 mmol/L (ref 22–32)
Calcium: 10.2 mg/dL (ref 8.9–10.3)
Chloride: 98 mmol/L — ABNORMAL LOW (ref 101–111)
Creatinine, Ser: 12.88 mg/dL — ABNORMAL HIGH (ref 0.61–1.24)
GFR calc Af Amer: 4 mL/min — ABNORMAL LOW (ref >60)
GFR calc non Af Amer: 4 mL/min — ABNORMAL LOW (ref >60)
Glucose, Bld: 104 mg/dL — ABNORMAL HIGH (ref 65–99)
Phosphorus: 7.9 mg/dL — ABNORMAL HIGH (ref 2.5–4.6)
Potassium: 4.3 mmol/L (ref 3.5–5.1)
Sodium: 136 mmol/L (ref 135–145)

## 2017-07-12 LAB — HEPARIN LEVEL (UNFRACTIONATED): HEPARIN UNFRACTIONATED: 0.34 [IU]/mL (ref 0.30–0.70)

## 2017-07-12 LAB — TROPONIN I
TROPONIN I: 1.84 ng/mL — AB (ref <0.03)
Troponin I: 1.88 ng/mL (ref <0.03)

## 2017-07-12 MED ORDER — SODIUM CHLORIDE 0.9 % IV SOLN: 100.0000 mL | INTRAVENOUS | Status: DC | PRN

## 2017-07-12 MED ORDER — HEPARIN SODIUM (PORCINE) 1000 UNIT/ML DIALYSIS
20.0000 [IU]/kg | INTRAMUSCULAR | Status: DC | PRN
  Filled 2017-07-12: qty 2

## 2017-07-12 MED ORDER — LIDOCAINE HCL (PF) 1 % IJ SOLN: 5.0000 mL | INTRAMUSCULAR | Status: DC | PRN

## 2017-07-12 MED ORDER — HEPARIN SODIUM (PORCINE) 1000 UNIT/ML DIALYSIS
1000.0000 [IU] | INTRAMUSCULAR | Status: DC | PRN
  Filled 2017-07-12: qty 1

## 2017-07-12 MED ORDER — LIDOCAINE-PRILOCAINE 2.5-2.5 % EX CREA: 1.0000 "application " | TOPICAL_CREAM | CUTANEOUS | Status: DC | PRN

## 2017-07-12 MED ORDER — DOXERCALCIFEROL 4 MCG/2ML IV SOLN
INTRAVENOUS | Status: AC
  Filled 2017-07-12: qty 2

## 2017-07-12 MED ORDER — PENTAFLUOROPROP-TETRAFLUOROETH EX AERO: 1.0000 "application " | INHALATION_SPRAY | CUTANEOUS | Status: DC | PRN

## 2017-07-12 MED ORDER — SEVELAMER CARBONATE 2.4 G PO PACK
2.4000 g | PACK | Freq: Three times a day (TID) | ORAL | Status: DC
  Administered 2017-07-12 – 2017-07-16 (×12): 2.4 g via ORAL
  Filled 2017-07-12 (×16): qty 1

## 2017-07-12 MED ORDER — ALTEPLASE 2 MG IJ SOLR
2.0000 mg | Freq: Once | INTRAMUSCULAR | Status: DC | PRN
  Filled 2017-07-12: qty 2

## 2017-07-12 NOTE — Progress Notes (Signed)
STROKE TEAM PROGRESS NOTE   SUBJECTIVE (INTERVAL HISTORY) Neuro exam stable, slowly improving. Heparin gtt per cardiology for NSTEMI. Seems to not be having any CP or any discomforts, difficult to assess d/t global aphasia. No further arrhythmias. Awaiting a bed on neuro floor, stable and ready to transfer out of ICU  OBJECTIVE Temp:  [98.3 F (36.8 C)-98.9 F (37.2 C)] 98.3 F (36.8 C) (04/28 0900) Pulse Rate:  [33-99] 79 (04/28 1145) Cardiac Rhythm: Normal sinus rhythm (04/28 0800) Resp:  [8-24] 19 (04/28 1145) BP: (90-173)/(58-103) 101/76 (04/28 1145) SpO2:  [93 %-100 %] 98 % (04/28 1145)  Recent Labs  Lab 07/11/17 1706 07/11/17 1946 07/11/17 2338 07/12/17 0834 07/12/17 1150  GLUCAP 110* 112* 123* 101* 93   Recent Labs  Lab 07/07/17 0804  07/08/17 2100 07/09/17 0750 07/10/17 0403 07/11/17 0613 07/12/17 0916  NA 135   < > 136 137 137 137 136  K 3.4*   < > 3.4* 4.0 3.8 3.6 4.3  CL 100*   < > 95* 98* 97* 93* 98*  CO2 21*   < > 25 21* 26 27 26   GLUCOSE 89   < > 147* 95 107* 110* 104*  BUN 26*   < > 23* 27* 13 27* 44*  CREATININE 10.40*   < > 11.20* 12.24* 7.71* 10.30* 12.88*  CALCIUM 9.2   < > 10.4* 10.5* 9.8 10.8* 10.2  MG  --   --  2.1  --   --   --   --   PHOS 4.5  --   --   --   --   --  7.9*   < > = values in this interval not displayed.   Recent Labs  Lab 07/05/17 2215 07/07/17 0804 07/08/17 2100 07/12/17 0916  AST 15  --  21  --   ALT 9*  --  <5*  --   ALKPHOS 40  --  49  --   BILITOT 1.0  --  0.9  --   PROT 7.4  --  7.0  --   ALBUMIN 3.9 3.3* 3.6 3.0*   Recent Labs  Lab 07/05/17 2215  07/06/17 0510  07/08/17 2100 07/09/17 0447 07/10/17 0403 07/11/17 0613 07/12/17 0917  WBC 14.8*  --  12.0*   < > 7.5 9.3 7.4 11.2* 10.1  NEUTROABS 12.7*  --  9.0*  --  5.7  --   --   --   --   HGB 13.2   < > 11.4*   < > 13.1 13.0 12.7* 13.6 11.2*  HCT 38.9*   < > 32.9*   < > 38.9* 39.6 38.3* 40.4 33.8*  MCV 92.4  --  91.1   < > 93.1 93.6 93.9 93.7 92.6  PLT  191  --  197   < > 229 169 250 297 263   < > = values in this interval not displayed.   Recent Labs  Lab 07/08/17 2100 07/09/17 0750  TROPONINI 0.62* 11.47*       Component Value Date/Time   CHOL 180 07/06/2017 0510   CHOL 208 (H) 03/19/2017 1152   TRIG 98 07/06/2017 0510   HDL 29 (L) 07/06/2017 0510   HDL 40 03/19/2017 1152   CHOLHDL 6.2 07/06/2017 0510   VLDL 20 07/06/2017 0510   LDLCALC 131 (H) 07/06/2017 0510   LDLCALC 139 (H) 03/19/2017 1152   Lab Results  Component Value Date   HGBA1C 4.6 (L) 07/06/2017    I  have personally reviewed the radiological images below and agree with the radiology interpretations.  Ct Angio Head W Or Wo Contrast  Result Date: 07/05/2017 CLINICAL DATA:  Right hemiparesis and difficulty speaking. EXAM: CT ANGIOGRAPHY HEAD AND NECK CT PERFUSION BRAIN TECHNIQUE: Multidetector CT imaging of the head and neck was performed using the standard protocol during bolus administration of intravenous contrast. Multiplanar CT image reconstructions and MIPs were obtained to evaluate the vascular anatomy. Carotid stenosis measurements (when applicable) are obtained utilizing NASCET criteria, using the distal internal carotid diameter as the denominator. Multiphase CT imaging of the brain was performed following IV bolus contrast injection. Subsequent parametric perfusion maps were calculated using RAPID software. CONTRAST:  ISOVUE-370 IOPAMIDOL (ISOVUE-370) INJECTION 76% COMPARISON:  Head MRA 12/29/2013.  Neck MRA 06/30/2005 FINDINGS: CTA NECK FINDINGS Aortic arch: Standard 3 vessel aortic arch. Widely patent arch vessel origins. Right carotid system: Patent with predominantly soft plaque in the common carotid artery resulting in less than 50% narrowing. Minimal calcified plaque at the carotid bifurcation. No ICA stenosis. Left carotid system: Patent with predominantly soft plaque in the distal common carotid artery resulting in 50% stenosis. Moderate calcified  plaque about the carotid bifurcation. No ICA stenosis. Vertebral arteries: Patent with the left being slightly dominant. Mild proximal right V2 stenosis due to calcified plaque. Calcified and soft plaque in the left V1 segment with mild multifocal stenoses. Skeleton: Mild cervical disc degeneration. Other neck: Multiple thyroid nodules including a 2.9 cm exophytic. Nodule extending inferiorly from the isthmus. Upper chest: Clear lung apices. Review of the MIP images confirms the above findings CTA HEAD FINDINGS Anterior circulation: The internal carotid arteries are patent from skull base to carotid termini with calcified plaque resulting in mild cavernous and supraclinoid stenosis on the right. The M1 segments are patent with mild stenosis on the right. The left M2 inferior division is occluded at its origin with some reconstitution of more distal M2 and M3 branches, however there are numerous missing distal branches in the left parietal region corresponding to the acute infarct. The prior MRA also suggested a severe stenosis or possibly short segment occlusion of the left M2 inferior division, however motion artifact on that study makes it difficult to make a detailed assessment of interval changes. There are also likely some missing distal left MCA superior division branch vessels. There also severe proximal right M2 stenoses which are at least partially chronic. The right A1 segment is occluded. The left A1 segment is patent but demonstrates a severe proximal stenosis. Both A2 segments are heavily diseased with multifocal severe stenoses and or short segment occlusions. The appearance of the ACAs is grossly similar to the prior motion degraded MRA. No aneurysm. Posterior circulation: The intracranial vertebral arteries are patent to the basilar. There is right greater than left V4 segment calcified plaque without significant stenosis. Patent SCA origins are identified bilaterally. PICA and AICA origins are not  well evaluated. The basilar artery is widely patent. Posterior communicating arteries are diminutive or absent. PCAs are patent with mild left P2 stenosis. No aneurysm. Venous sinuses: Inadequately assessed due to arterial phase contrast timing. Anatomic variants: None. Delayed phase: Not performed. Review of the MIP images confirms the above findings CT Brain Perfusion Findings: CBF (<30%) Volume: 28mL Perfusion (Tmax>6.0s) volume: , however a substantial amount of this is located in the right cerebral hemisphere Mismatch Volume: though with the caveat that this is artifactually inflated due to (likely chronic) prolonged perfusion in the right cerebral hemisphere. There  is still however an apparent sizable penumbra in the posterior left cerebral hemisphere. Infarction Location:Predominantly left parietal lobe with a small amount of posterior left frontal lobe, posterior left temporal lobe, and posterior insula involvement. IMPRESSION: 1. 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 2. Left M2 inferior division occlusion at its origin with some distal reconstitution. Severe stenosis (or possibly short segment occlusion) in this location on a prior MRA. Numerous missing distal branch vessels in the left parietal region corresponding to the acute infarct. 3. Advanced intracranial atherosclerosis with numerous severe stenoses as above. 4. 50% left common carotid artery stenosis. 5.  Aortic Atherosclerosis (ICD10-I70.0). 6. Thyroid nodules measuring up to 2.9 cm. Nonemergent thyroid ultrasound recommended for further evaluation. These results were communicated to Dr. Amada Jupiter at 10:18 pm on 07/05/2017 by text page via the [facility] messaging system. Electronically Signed   By: Sebastian Ache M.D.   On: 07/05/2017 22:47   Ct Head Wo Contrast  Result Date: 07/08/2017 CLINICAL DATA:  57 y/o M; stroke with sudden increase in blood pressure. EXAM: CT HEAD WITHOUT CONTRAST TECHNIQUE: Contiguous axial  images were obtained from the base of the skull through the vertex without intravenous contrast. COMPARISON:  07/06/2017 CT head. FINDINGS: Brain: Small area of loss of gray-white differentiation within the left parietal lobe is similar in distribution comparison with area of infarction on MRI and is compatible with evolving late acute to early subacute infarction. No new area of acute infarction, hemorrhage, or focal mass effect identified. Stable small chronic infarction in the right posterior temporal lobe, left hemi pons, left inferior cerebellum, and lacunar infarctions within bilateral anterior basal ganglia. Stable background of chronic microvascular ischemic changes and parenchymal volume loss of the brain. Vascular: Persistent contrast within the venous and arterial systems. Skull: Normal. Negative for fracture or focal lesion. Sinuses/Orbits: No acute finding. Other: None. IMPRESSION: 1. Left parietal region of acute infarction is stable in comparison with prior MRI given differences in technique. No new acute intracranial abnormality identified. 2. Stable background of chronic infarctions, microvascular ischemic changes, parenchymal volume loss of the brain. Electronically Signed   By: Mitzi Hansen M.D.   On: 07/08/2017 20:03   Ct Head Wo Contrast  Result Date: 07/06/2017 CLINICAL DATA:  Stroke follow-up EXAM: CT HEAD WITHOUT CONTRAST TECHNIQUE: Contiguous axial images were obtained from the base of the skull through the vertex without intravenous contrast. COMPARISON:  Brain MRI 07/06/2017 Head CT 07/06/2017 at 5:35 a.m. FINDINGS: Brain: Decreased density of material of the left basal ganglia, consistent with clearing of contrast staining. Right temporal encephalomalacia is unchanged. No acute hemorrhage or mass effect. No hydrocephalus. Vascular: Small amount of residual contrast material within the vessels. Skull: Normal visualized skull base, calvarium and extracranial soft tissues.  Sinuses/Orbits: No sinus fluid levels or advanced mucosal thickening. No mastoid effusion. Normal orbits. IMPRESSION: Decreased density of hyperdense material at the left basal ganglia, consistent with clearing of contrast staining. No new abnormality. Electronically Signed   By: Deatra Robinson M.D.   On: 07/06/2017 23:24   Ct Head Wo Contrast  Result Date: 07/06/2017 CLINICAL DATA:  Left MCA infarct status post intervention EXAM: CT HEAD WITHOUT CONTRAST TECHNIQUE: Contiguous axial images were obtained from the base of the skull through the vertex without intravenous contrast. COMPARISON:  Cerebral angiogram 07/05/2017 CTA head neck 07/05/2017 FINDINGS: Brain: There is hyperdensity within the left basal ganglia, likely indicating contrast staining. No midline shift or mass effect. No hydrocephalus. Unchanged right temporal lobe encephalomalacia. Vascular:  There is intravascular contrast material related to prior an angiographic study. Skull: Normal visualized skull base, calvarium and extracranial soft tissues. Sinuses/Orbits: No sinus fluid levels or advanced mucosal thickening. No mastoid effusion. Normal orbits. IMPRESSION: Hyperdensity of the left basal ganglia is favored to represent contrast staining over petechial hemorrhage. Electronically Signed   By: Deatra Robinson M.D.   On: 07/06/2017 05:56   Ct Angio Neck W Or Wo Contrast  Result Date: 07/05/2017 CLINICAL DATA:  Right hemiparesis and difficulty speaking. EXAM: CT ANGIOGRAPHY HEAD AND NECK CT PERFUSION BRAIN TECHNIQUE: Multidetector CT imaging of the head and neck was performed using the standard protocol during bolus administration of intravenous contrast. Multiplanar CT image reconstructions and MIPs were obtained to evaluate the vascular anatomy. Carotid stenosis measurements (when applicable) are obtained utilizing NASCET criteria, using the distal internal carotid diameter as the denominator. Multiphase CT imaging of the brain was performed  following IV bolus contrast injection. Subsequent parametric perfusion maps were calculated using RAPID software. CONTRAST:  ISOVUE-370 IOPAMIDOL (ISOVUE-370) INJECTION 76% COMPARISON:  Head MRA 12/29/2013.  Neck MRA 06/30/2005 FINDINGS: CTA NECK FINDINGS Aortic arch: Standard 3 vessel aortic arch. Widely patent arch vessel origins. Right carotid system: Patent with predominantly soft plaque in the common carotid artery resulting in less than 50% narrowing. Minimal calcified plaque at the carotid bifurcation. No ICA stenosis. Left carotid system: Patent with predominantly soft plaque in the distal common carotid artery resulting in 50% stenosis. Moderate calcified plaque about the carotid bifurcation. No ICA stenosis. Vertebral arteries: Patent with the left being slightly dominant. Mild proximal right V2 stenosis due to calcified plaque. Calcified and soft plaque in the left V1 segment with mild multifocal stenoses. Skeleton: Mild cervical disc degeneration. Other neck: Multiple thyroid nodules including a 2.9 cm exophytic. Nodule extending inferiorly from the isthmus. Upper chest: Clear lung apices. Review of the MIP images confirms the above findings CTA HEAD FINDINGS Anterior circulation: The internal carotid arteries are patent from skull base to carotid termini with calcified plaque resulting in mild cavernous and supraclinoid stenosis on the right. The M1 segments are patent with mild stenosis on the right. The left M2 inferior division is occluded at its origin with some reconstitution of more distal M2 and M3 branches, however there are numerous missing distal branches in the left parietal region corresponding to the acute infarct. The prior MRA also suggested a severe stenosis or possibly short segment occlusion of the left M2 inferior division, however motion artifact on that study makes it difficult to make a detailed assessment of interval changes. There are also likely some missing distal left  MCA superior division branch vessels. There also severe proximal right M2 stenoses which are at least partially chronic. The right A1 segment is occluded. The left A1 segment is patent but demonstrates a severe proximal stenosis. Both A2 segments are heavily diseased with multifocal severe stenoses and or short segment occlusions. The appearance of the ACAs is grossly similar to the prior motion degraded MRA. No aneurysm. Posterior circulation: The intracranial vertebral arteries are patent to the basilar. There is right greater than left V4 segment calcified plaque without significant stenosis. Patent SCA origins are identified bilaterally. PICA and AICA origins are not well evaluated. The basilar artery is widely patent. Posterior communicating arteries are diminutive or absent. PCAs are patent with mild left P2 stenosis. No aneurysm. Venous sinuses: Inadequately assessed due to arterial phase contrast timing. Anatomic variants: None. Delayed phase: Not performed. Review of the MIP images  confirms the above findings CT Brain Perfusion Findings: CBF (<30%) Volume: 28mL Perfusion (Tmax>6.0s) volume: , however a substantial amount of this is located in the right cerebral hemisphere Mismatch Volume: though with the caveat that this is artifactually inflated due to (likely chronic) prolonged perfusion in the right cerebral hemisphere. There is still however an apparent sizable penumbra in the posterior left cerebral hemisphere. Infarction Location:Predominantly left parietal lobe with a small amount of posterior left frontal lobe, posterior left temporal lobe, and posterior insula involvement. IMPRESSION: 1. 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 2. Left M2 inferior division occlusion at its origin with some distal reconstitution. Severe stenosis (or possibly short segment occlusion) in this location on a prior MRA. Numerous missing distal branch vessels in the left parietal region  corresponding to the acute infarct. 3. Advanced intracranial atherosclerosis with numerous severe stenoses as above. 4. 50% left common carotid artery stenosis. 5.  Aortic Atherosclerosis (ICD10-I70.0). 6. Thyroid nodules measuring up to 2.9 cm. Nonemergent thyroid ultrasound recommended for further evaluation. These results were communicated to Dr. Amada Jupiter at 10:18 pm on 07/05/2017 by text page via the [facility] messaging system. Electronically Signed   By: Sebastian Ache M.D.   On: 07/05/2017 22:47   Mr Maxine Glenn Head Wo Contrast  Result Date: 07/06/2017 CLINICAL DATA:  Stroke follow-up EXAM: MRI HEAD WITHOUT CONTRAST MRA HEAD WITHOUT CONTRAST TECHNIQUE: Multiplanar, multiecho pulse sequences of the brain and surrounding structures were obtained without intravenous contrast. Angiographic images of the head were obtained using MRA technique without contrast. COMPARISON:  CT, CTA, and CTP 07/05/2017.  Brain MRI 04/27/2016 FINDINGS: MRI HEAD FINDINGS Brain: Cortically based restricted diffusion in the posterior left insula, superior temporal lobe, parietal lobe, and minimally involving the posterior left frontal lobe. There is good correlation with infarct by CT perfusion. Small remote infarcts in the left superior cerebellum, left paramedian pons, left basal ganglia/internal capsule, and right temporal parietal cortex. There is chronic blood products at the remote left basal ganglia infarct. No hemorrhage seen at the acute infarct. No hydrocephalus or masslike finding. Vascular: Arterial findings below. Normal dural venous sinus flow voids. Skull and upper cervical spine: Negative Sinuses/Orbits: Negative MRA HEAD FINDINGS Symmetric carotid and vertebral arteries. The vertebral and basilar arteries are smooth and diffusely patent. Atheromatous irregularity of bilateral posterior cerebral arteries with high-grade left P2 segment narrowing. Essentially nonvisualized bilateral ACA attributed to advanced atheromatous  narrowing based on prior CTA. Less robust flow signal seen in the left MCA with continued nonvisualization of the inferior division. The left superior division and right MCA vasculature show diffuse atheromatous irregularity. IMPRESSION: 1. Acute left MCA territory cortical infarct without detected progression when compared to CTP from yesterday. No hemorrhagic conversion. 2. Continued non visualization of the left MCA inferior division. 3. There is advanced intracranial atherosclerosis with nonvisualized ACAs and severe left P2 segment stenosis. 4. Remote small vessel infarcts as described. Electronically Signed   By: Marnee Spring M.D.   On: 07/06/2017 12:59   Mr Brain Wo Contrast  Result Date: 07/06/2017 CLINICAL DATA:  Stroke follow-up EXAM: MRI HEAD WITHOUT CONTRAST MRA HEAD WITHOUT CONTRAST TECHNIQUE: Multiplanar, multiecho pulse sequences of the brain and surrounding structures were obtained without intravenous contrast. Angiographic images of the head were obtained using MRA technique without contrast. COMPARISON:  CT, CTA, and CTP 07/05/2017.  Brain MRI 04/27/2016 FINDINGS: MRI HEAD FINDINGS Brain: Cortically based restricted diffusion in the posterior left insula, superior temporal lobe, parietal lobe, and minimally involving the posterior  left frontal lobe. There is good correlation with infarct by CT perfusion. Small remote infarcts in the left superior cerebellum, left paramedian pons, left basal ganglia/internal capsule, and right temporal parietal cortex. There is chronic blood products at the remote left basal ganglia infarct. No hemorrhage seen at the acute infarct. No hydrocephalus or masslike finding. Vascular: Arterial findings below. Normal dural venous sinus flow voids. Skull and upper cervical spine: Negative Sinuses/Orbits: Negative MRA HEAD FINDINGS Symmetric carotid and vertebral arteries. The vertebral and basilar arteries are smooth and diffusely patent. Atheromatous irregularity of  bilateral posterior cerebral arteries with high-grade left P2 segment narrowing. Essentially nonvisualized bilateral ACA attributed to advanced atheromatous narrowing based on prior CTA. Less robust flow signal seen in the left MCA with continued nonvisualization of the inferior division. The left superior division and right MCA vasculature show diffuse atheromatous irregularity. IMPRESSION: 1. Acute left MCA territory cortical infarct without detected progression when compared to CTP from yesterday. No hemorrhagic conversion. 2. Continued non visualization of the left MCA inferior division. 3. There is advanced intracranial atherosclerosis with nonvisualized ACAs and severe left P2 segment stenosis. 4. Remote small vessel infarcts as described. Electronically Signed   By: Marnee Spring M.D.   On: 07/06/2017 12:59   Ir Ct Head Ltd  Result Date: 07/07/2017 INDICATION: Flaccid right and arm leg. Left gaze deviation. Dysarthria. Occluded inferior division of the left middle cerebral artery. EXAM: 1. EMERGENT LARGE VESSEL OCCLUSION THROMBOLYSIS (anterior CIRCULATION) COMPARISON:  CT angiogram of the head and neck of 07/05/2017. MEDICATIONS: Ancef 2 g IV antibiotic was administered within 1 hour of the procedure. ANESTHESIA/SEDATION: General anesthesia. CONTRAST:  Isovue 300 approximately 70 cc. FLUOROSCOPY TIME:  Fluoroscopy Time: 27 minutes 12 seconds (2951 mGy). COMPLICATIONS: None immediate. TECHNIQUE: Following a full explanation of the procedure along with the potential associated complications, an informed witnessed consent was obtained. The risks of intracranial hemorrhage of 10%, worsening neurological deficit, ventilator dependency, death and inability to revascularize were all reviewed in detail with the patient's family. The patient was then put under general anesthesia by the Department of Anesthesiology at [facility]. The right groin was prepped and draped in the usual sterile fashion.  Thereafter using modified Seldinger technique, transfemoral access into the right common femoral artery was obtained without difficulty. Over a 0.035 inch guidewire a 5 French Pinnacle sheath was inserted. Through this, and also over a 0.035 inch guidewire a 5 [place] JB 1 catheter was advanced to the aortic arch region and selectively positioned in the right common carotid artery, the right vertebral artery, the left vertebral artery and the left common carotid artery. FINDINGS: The left common carotid arteriogram demonstrates approximately 50% stenosis of the mid segment of the left common carotid artery. The left external carotid artery and its major branches are widely patent. The left internal carotid artery at the bulb demonstrates wide patency. More distally, there is mild decreased caliber of the left internal carotid artery in its entirety. The petrous, cavernous and supraclinoid segments demonstrate wide patency. The left anterior cerebral artery demonstrates complete occlusion distal to the left A1 segment. This is associated with caliber irregularity in the A1 segment. The left middle cerebral artery M1 segment proximally demonstrates mild caliber irregularity. There is patency of the superior division of the left middle cerebral artery with delayed reconstitution of the distal inferior division of the left middle cerebral artery in the M2 M3 regions. Distal to this the M3 branches and the M4 branches demonstrated gradual ascent of  contrast. The inferior division appears to be completely occluded associated with a stump. A diminutive anterior temporal branch is noted. A prominent anterior choroidal artery on the left is noted with delayed arterial blush noted in the region of the splenium of the corpus callosum. The whole head delayed arterial phase in the lateral projection demonstrates a prominent area of hypoperfusion involving the left parietal cortical and subcortical regions. The left vertebral  artery origin is from the thyrocervical trunk. The vessel is seen to opacify to the cranial skull base. Wide patency is seen of the left vertebrobasilar junction and the left posterior inferior cerebellar artery. The basilar artery, the posterior cerebral arteries, the superior cerebellar arteries and the anterior-inferior cerebellar arteries opacify into the capillary and venous phases. Prominent collaterals are seen in the P3 region of the left posterior cerebral artery retrogradely filling the posterior 1/3 of the left parietal cortical and subcortical areas. The delayed arterial phase then demonstrates retrograde opacification of the distal pericallosal artery in the splenium of the corpus callosum from the lateral thalamo perforator branches. Also demonstrated is retrograde opacification of the posterior half of the anterior temporal region from the posterior temporal branches of the P1 P2 region of the left posterior cerebral artery. The right common carotid arteriogram demonstrates mild narrowing in the mid third of the right common carotid artery. The right external carotid artery and its major branches are widely patent. The right internal carotid artery at the bulb to the cranial skull base is seen to opacify unimpeded. The petrous segment is normal. There is mild narrowing of the caval cavernous segment of the right internal carotid artery. Distal to this the supraclinoid segment demonstrates mild decrease in caliber. The right middle cerebral artery demonstrates a 50% stenosis in the distal right M1 segment. The proximal superior and the inferior divisions demonstrate moderate stenosis. Again demonstrated is a prominent anterior choroidal artery on the right side. The delayed arterial phase demonstrates hypoperfusion of the parietal cortical and subcortical regions. The right vertebral artery origin is from the right thyrocervical trunk. Again this vessel is seen to opacify to the cranial skull base.  Wide patency is seen of the right vertebrobasilar junction and the right posterior-inferior cerebellar artery. Multiple focal areas of caliber irregularity are seen involving the posterior-inferior cerebellar artery. The opacified portion of the basilar artery, the proximal posterior cerebral arteries, the superior cerebellar arteries and the anterior-inferior cerebellar arteries are seen to opacify into the capillary and venous phases. Extensive collaterals are again seen from the P3 regions of both posterior cerebral arteries contributing to the posterior parietal middle cerebral artery distributions bilaterally as described earlier. PROCEDURE: ENDOVASCULAR TREATMENT OF OCCLUDED DOMINANT INFERIOR DIVISION OF THE LEFT MIDDLE CEREBRAL ARTERY The diagnostic JB 1 catheter in the left common carotid artery was exchanged over a 0.035 inch 8 French 55 cm Brite tip neurovascular sheath using biplane roadmap technique and constant fluoroscopic guidance. Good aspiration obtained from the hub of the 8 [place] Brite tip neurovascular sheath. A gentle contrast injection demonstrated no evidence of spasms, dissections or of intraluminal filling defects. This was then connected to continuous heparinized saline infusion. Over the [facility] exchange guidewire, an 85 cm 8 [place] of balloon FlowGate guide catheter which had been prepped with 50% contrast and 50% heparinized saline infusion was advanced and positioned just proximal to the left common carotid bifurcation. The guidewire was removed. Good aspiration was obtained at the hub of the [facility] guide catheter. Over a 0.035 inch Roadrunner guidewire, using biplane  roadmap technique and constant fluoroscopic guidance, the FlowGate guide catheter was then advanced to the mid cervical left ICA. The guidewire was removed. Again good aspiration was obtained from the hub of the 8 [place] FlowGate guide catheter. A gentle control arteriogram performed centered extracranially and  intracranially demonstrated no change in the intracranial circulation. Over a 0.014 inch Softip Synchro micro guidewire, a Trevo ProVue 021 microcatheter was advanced to the distal end of the of FlowGate guide catheter. With the micro guidewire leading with a J-tip configuration, the combination was navigated to the supraclinoid left ICA. The micro guidewire was then advanced gently into the inferior division origin followed by the microcatheter. A gentle manipulation of the micro guidewire was then performed using a torque device to encourage advancement through the occluded left middle cerebral artery. The microcatheter was then brought into close proximity to the site of the occlusion. Further manipulation was performed with partial advancement of the micro guidewire through the occluded left middle cerebral artery inferior division. To avoid potential perforation of the more distal inferior division the micro guidewire was retrieved. Good aspiration obtained from the hub of the microcatheter. Approximately 3.6 mg of super selective intracranial intra-arterial Integrilin was infused over a couple of minutes. Thereafter, further attempts were made to encourage advancement of a very soft microguidewire again with only partial success. 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 The procedure was then stopped. The final control arteriogram performed through the [facility] guide catheter in the left internal carotid artery continued to demonstrate no significant change in the intracranial circulation. No angiographic evidence of extravasation or mass-effect or midline shift was seen. A Dyna CT of the head performed revealed no evidence of hemorrhage or change in the ventricular size or mass effect or midline shift. The [facility] guide catheter and the neurovascular sheath were then exchanged over a J-tip 0.035 guidewire for an 8 [place] Pinnacle sheath. This was then removed with successful hemostasis  being achieved by manual compression. At the end of the procedure, the patient's right groin appeared soft without evidence of bleeding or hematoma. Distal pulses continued to be palpable in the dorsalis pedis, and the posterior tibial regions unchanged from prior to the procedure. The patient's general anesthesia was then reversed. The patient was extubated without difficulty. Upon recovery the patient was noted to be able to raise his right arm, and also bend his right knee. He responded appropriately to simple commands. He was then transferred to the PACU and then to neuro ICU for further management. IMPRESSION: Status post attempted revascularization of occluded dominant inferior division of the right middle cerebral artery using 3.6 mg of super selective intracranial intra-arterial Integrilin and mechanical thrombolysis with a micro guidewire. PLAN: Further management as per stroke team. Electronically Signed   By: Julieanne Cotton M.D.   On: 07/06/2017 10:05   Ct Cerebral Perfusion W Contrast  Result Date: 07/05/2017 CLINICAL DATA:  Right hemiparesis and difficulty speaking. EXAM: CT ANGIOGRAPHY HEAD AND NECK CT PERFUSION BRAIN TECHNIQUE: Multidetector CT imaging of the head and neck was performed using the standard protocol during bolus administration of intravenous contrast. Multiplanar CT image reconstructions and MIPs were obtained to evaluate the vascular anatomy. Carotid stenosis measurements (when applicable) are obtained utilizing NASCET criteria, using the distal internal carotid diameter as the denominator. Multiphase CT imaging of the brain was performed following IV bolus contrast injection. Subsequent parametric perfusion maps were calculated using RAPID software. CONTRAST:  ISOVUE-370 IOPAMIDOL (ISOVUE-370) INJECTION 76% COMPARISON:  Head MRA 12/29/2013.  Neck MRA 06/30/2005 FINDINGS: CTA NECK FINDINGS Aortic arch: Standard 3 vessel aortic arch. Widely patent arch vessel origins.  Right carotid system: Patent with predominantly soft plaque in the common carotid artery resulting in less than 50% narrowing. Minimal calcified plaque at the carotid bifurcation. No ICA stenosis. Left carotid system: Patent with predominantly soft plaque in the distal common carotid artery resulting in 50% stenosis. Moderate calcified plaque about the carotid bifurcation. No ICA stenosis. Vertebral arteries: Patent with the left being slightly dominant. Mild proximal right V2 stenosis due to calcified plaque. Calcified and soft plaque in the left V1 segment with mild multifocal stenoses. Skeleton: Mild cervical disc degeneration. Other neck: Multiple thyroid nodules including a 2.9 cm exophytic. Nodule extending inferiorly from the isthmus. Upper chest: Clear lung apices. Review of the MIP images confirms the above findings CTA HEAD FINDINGS Anterior circulation: The internal carotid arteries are patent from skull base to carotid termini with calcified plaque resulting in mild cavernous and supraclinoid stenosis on the right. The M1 segments are patent with mild stenosis on the right. The left M2 inferior division is occluded at its origin with some reconstitution of more distal M2 and M3 branches, however there are numerous missing distal branches in the left parietal region corresponding to the acute infarct. The prior MRA also suggested a severe stenosis or possibly short segment occlusion of the left M2 inferior division, however motion artifact on that study makes it difficult to make a detailed assessment of interval changes. There are also likely some missing distal left MCA superior division branch vessels. There also severe proximal right M2 stenoses which are at least partially chronic. The right A1 segment is occluded. The left A1 segment is patent but demonstrates a severe proximal stenosis. Both A2 segments are heavily diseased with multifocal severe stenoses and or short segment occlusions. The  appearance of the ACAs is grossly similar to the prior motion degraded MRA. No aneurysm. Posterior circulation: The intracranial vertebral arteries are patent to the basilar. There is right greater than left V4 segment calcified plaque without significant stenosis. Patent SCA origins are identified bilaterally. PICA and AICA origins are not well evaluated. The basilar artery is widely patent. Posterior communicating arteries are diminutive or absent. PCAs are patent with mild left P2 stenosis. No aneurysm. Venous sinuses: Inadequately assessed due to arterial phase contrast timing. Anatomic variants: None. Delayed phase: Not performed. Review of the MIP images confirms the above findings CT Brain Perfusion Findings: CBF (<30%) Volume: 28mL Perfusion (Tmax>6.0s) volume: , however a substantial amount of this is located in the right cerebral hemisphere Mismatch Volume: though with the caveat that this is artifactually inflated due to (likely chronic) prolonged perfusion in the right cerebral hemisphere. There is still however an apparent sizable penumbra in the posterior left cerebral hemisphere. Infarction Location:Predominantly left parietal lobe with a small amount of posterior left frontal lobe, posterior left temporal lobe, and posterior insula involvement. IMPRESSION: 1. 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 2. Left M2 inferior division occlusion at its origin with some distal reconstitution. Severe stenosis (or possibly short segment occlusion) in this location on a prior MRA. Numerous missing distal branch vessels in the left parietal region corresponding to the acute infarct. 3. Advanced intracranial atherosclerosis with numerous severe stenoses as above. 4. 50% left common carotid artery stenosis. 5.  Aortic Atherosclerosis (ICD10-I70.0). 6. Thyroid nodules measuring up to 2.9 cm. Nonemergent thyroid ultrasound recommended for further evaluation. These results were  communicated to Dr. Amada Jupiter at 10:18 pm on 07/05/2017 by text page via the [facility] messaging system. Electronically Signed   By: Sebastian Ache M.D.   On: 07/05/2017 22:47   Dg Chest Port 1 View  Result Date: 07/06/2017 CLINICAL DATA:  Cardiomyopathy, coronary artery disease with stent placement, chronic renal insufficiency. EXAM: PORTABLE CHEST 1 VIEW COMPARISON:  Chest x-ray of March 04, 2017 FINDINGS: The lungs are borderline hypoinflated but clear. The heart is top-normal in size. The pulmonary vascularity is normal. There is no pleural effusion. There are post CABG changes. There is a vascular graft in the left axillary region. The bony thorax exhibits no acute abnormality. IMPRESSION: Top-normal cardiac size without pulmonary edema or pleural effusion. No pneumonia. Electronically Signed   By: David  [place] M.D.   On: 07/06/2017 07:57   Ir Percutaneous Art Thrombectomy/infusion Intracranial Inc Diag Angio  Result Date: 07/07/2017 INDICATION: Flaccid right and arm leg. Left gaze deviation. Dysarthria. Occluded inferior division of the left middle cerebral artery. EXAM: 1. EMERGENT LARGE VESSEL OCCLUSION THROMBOLYSIS (anterior CIRCULATION) COMPARISON:  CT angiogram of the head and neck of 07/05/2017. MEDICATIONS: Ancef 2 g IV antibiotic was administered within 1 hour of the procedure. ANESTHESIA/SEDATION: General anesthesia. CONTRAST:  Isovue 300 approximately 70 cc. FLUOROSCOPY TIME:  Fluoroscopy Time: 27 minutes 12 seconds (2951 mGy). COMPLICATIONS: None immediate. TECHNIQUE: Following a full explanation of the procedure along with the potential associated complications, an informed witnessed consent was obtained. The risks of intracranial hemorrhage of 10%, worsening neurological deficit, ventilator dependency, death and inability to revascularize were all reviewed in detail with the patient's family. The patient was then put under general anesthesia by the Department of Anesthesiology at [facility]. The right groin was prepped and draped in the usual sterile fashion. Thereafter using modified Seldinger technique, transfemoral access into the right common femoral artery was obtained without difficulty. Over a 0.035 inch guidewire a 5 French Pinnacle sheath was inserted. Through this, and also over a 0.035 inch guidewire a 5 [place] JB 1 catheter was advanced to the aortic arch region and selectively positioned in the right common carotid artery, the right vertebral artery, the left vertebral artery and the left common carotid artery. FINDINGS: The left common carotid arteriogram demonstrates approximately 50% stenosis of the mid segment of the left common carotid artery. The left external carotid artery and its major branches are widely patent. The left internal carotid artery at the bulb demonstrates wide patency. More distally, there is mild decreased caliber of the left internal carotid artery in its entirety. The petrous, cavernous and supraclinoid segments demonstrate wide patency. The left anterior cerebral artery demonstrates complete occlusion distal to the left A1 segment. This is associated with caliber irregularity in the A1 segment. The left middle cerebral artery M1 segment proximally demonstrates mild caliber irregularity. There is patency of the superior division of the left middle cerebral artery with delayed reconstitution of the distal inferior division of the left middle cerebral artery in the M2 M3 regions. Distal to this the M3 branches and the M4 branches demonstrated gradual ascent of contrast. The inferior division appears to be completely occluded associated with a stump. A diminutive anterior temporal branch is noted. A prominent anterior choroidal artery on the left is noted with delayed arterial blush noted in the region of the splenium of the corpus callosum. The whole head delayed arterial phase in the lateral projection demonstrates a prominent area of hypoperfusion  involving the left parietal cortical and subcortical  regions. The left vertebral artery origin is from the thyrocervical trunk. The vessel is seen to opacify to the cranial skull base. Wide patency is seen of the left vertebrobasilar junction and the left posterior inferior cerebellar artery. The basilar artery, the posterior cerebral arteries, the superior cerebellar arteries and the anterior-inferior cerebellar arteries opacify into the capillary and venous phases. Prominent collaterals are seen in the P3 region of the left posterior cerebral artery retrogradely filling the posterior 1/3 of the left parietal cortical and subcortical areas. The delayed arterial phase then demonstrates retrograde opacification of the distal pericallosal artery in the splenium of the corpus callosum from the lateral thalamo perforator branches. Also demonstrated is retrograde opacification of the posterior half of the anterior temporal region from the posterior temporal branches of the P1 P2 region of the left posterior cerebral artery. The right common carotid arteriogram demonstrates mild narrowing in the mid third of the right common carotid artery. The right external carotid artery and its major branches are widely patent. The right internal carotid artery at the bulb to the cranial skull base is seen to opacify unimpeded. The petrous segment is normal. There is mild narrowing of the caval cavernous segment of the right internal carotid artery. Distal to this the supraclinoid segment demonstrates mild decrease in caliber. The right middle cerebral artery demonstrates a 50% stenosis in the distal right M1 segment. The proximal superior and the inferior divisions demonstrate moderate stenosis. Again demonstrated is a prominent anterior choroidal artery on the right side. The delayed arterial phase demonstrates hypoperfusion of the parietal cortical and subcortical regions. The right vertebral artery origin is from the right  thyrocervical trunk. Again this vessel is seen to opacify to the cranial skull base. Wide patency is seen of the right vertebrobasilar junction and the right posterior-inferior cerebellar artery. Multiple focal areas of caliber irregularity are seen involving the posterior-inferior cerebellar artery. The opacified portion of the basilar artery, the proximal posterior cerebral arteries, the superior cerebellar arteries and the anterior-inferior cerebellar arteries are seen to opacify into the capillary and venous phases. Extensive collaterals are again seen from the P3 regions of both posterior cerebral arteries contributing to the posterior parietal middle cerebral artery distributions bilaterally as described earlier. PROCEDURE: ENDOVASCULAR TREATMENT OF OCCLUDED DOMINANT INFERIOR DIVISION OF THE LEFT MIDDLE CEREBRAL ARTERY The diagnostic JB 1 catheter in the left common carotid artery was exchanged over a 0.035 inch 8 French 55 cm Brite tip neurovascular sheath using biplane roadmap technique and constant fluoroscopic guidance. Good aspiration obtained from the hub of the 8 [place] Brite tip neurovascular sheath. A gentle contrast injection demonstrated no evidence of spasms, dissections or of intraluminal filling defects. This was then connected to continuous heparinized saline infusion. Over the [facility] exchange guidewire, an 85 cm 8 [place] of balloon FlowGate guide catheter which had been prepped with 50% contrast and 50% heparinized saline infusion was advanced and positioned just proximal to the left common carotid bifurcation. The guidewire was removed. Good aspiration was obtained at the hub of the [facility] guide catheter. Over a 0.035 inch Roadrunner guidewire, using biplane roadmap technique and constant fluoroscopic guidance, the FlowGate guide catheter was then advanced to the mid cervical left ICA. The guidewire was removed. Again good aspiration was obtained from the hub of the 8 [place] FlowGate guide  catheter. A gentle control arteriogram performed centered extracranially and intracranially demonstrated no change in the intracranial circulation. Over a 0.014 inch Softip Synchro micro guidewire, a Trevo ProVue 021 microcatheter was  advanced to the distal end of the of FlowGate guide catheter. With the micro guidewire leading with a J-tip configuration, the combination was navigated to the supraclinoid left ICA. The micro guidewire was then advanced gently into the inferior division origin followed by the microcatheter. A gentle manipulation of the micro guidewire was then performed using a torque device to encourage advancement through the occluded left middle cerebral artery. The microcatheter was then brought into close proximity to the site of the occlusion. Further manipulation was performed with partial advancement of the micro guidewire through the occluded left middle cerebral artery inferior division. To avoid potential perforation of the more distal inferior division the micro guidewire was retrieved. Good aspiration obtained from the hub of the microcatheter. Approximately 3.6 mg of super selective intracranial intra-arterial Integrilin was infused over a couple of minutes. Thereafter, further attempts were made to encourage advancement of a very soft microguidewire again with only partial success. 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 The procedure was then stopped. The final control arteriogram performed through the [facility] guide catheter in the left internal carotid artery continued to demonstrate no significant change in the intracranial circulation. No angiographic evidence of extravasation or mass-effect or midline shift was seen. A Dyna CT of the head performed revealed no evidence of hemorrhage or change in the ventricular size or mass effect or midline shift. The [facility] guide catheter and the neurovascular sheath were then exchanged over a J-tip 0.035 guidewire for an 8  [place] Pinnacle sheath. This was then removed with successful hemostasis being achieved by manual compression. At the end of the procedure, the patient's right groin appeared soft without evidence of bleeding or hematoma. Distal pulses continued to be palpable in the dorsalis pedis, and the posterior tibial regions unchanged from prior to the procedure. The patient's general anesthesia was then reversed. The patient was extubated without difficulty. Upon recovery the patient was noted to be able to raise his right arm, and also bend his right knee. He responded appropriately to simple commands. He was then transferred to the PACU and then to neuro ICU for further management. IMPRESSION: Status post attempted revascularization of occluded dominant inferior division of the right middle cerebral artery using 3.6 mg of super selective intracranial intra-arterial Integrilin and mechanical thrombolysis with a micro guidewire. PLAN: Further management as per stroke team. Electronically Signed   By: Julieanne Cotton M.D.   On: 07/06/2017 10:05   Ct Head Code Stroke Wo Contrast  Result Date: 07/05/2017 CLINICAL DATA:  Code stroke. Right hemiparesis and difficulty speaking. EXAM: CT HEAD WITHOUT CONTRAST TECHNIQUE: Contiguous axial images were obtained from the base of the skull through the vertex without intravenous contrast. COMPARISON:  Brain MRI 04/27/2016 FINDINGS: Brain: There is loss of gray-white differentiation consistent with acute infarction involving the posterior left insula and left parietal lobe extending towards the vertex. No acute intracranial hemorrhage, mass, midline shift, or extra-axial fluid collection is identified. Chronic lacunar infarcts are present in the basal ganglia and thalami bilaterally. There is a chronic right temporoparietal infarct. There is a small chronic left cerebellar infarct. Patchy cerebral white matter hypodensities are nonspecific but compatible with chronic small vessel  ischemic disease, moderately advanced for age. Mild cerebral atrophy is advanced for age. Vascular: Calcified atherosclerosis at the skull base. No hyperdense vessel. Skull: No fracture or focal osseous lesion. Sinuses/Orbits: Visualized paranasal sinuses and mastoid air cells are clear. Orbits are unremarkable. Other: None. ASPECTS [facility] Stroke Program Early CT Score) -  Ganglionic level infarction (caudate, lentiform nuclei, internal capsule, insula, M1-M3 cortex): 5-6 - Supraganglionic infarction (M4-M6 cortex): 2 Total score (0-10 with 10 being normal): 7-8 IMPRESSION: 1. Acute left MCA infarct involving the insula and parietal lobe. No hemorrhage. 2. ASPECTS is 7-8. 3. Age advanced chronic ischemia. These results were communicated to Dr. Amada Jupiter at 9:54 pm on 07/05/2017 by text page via the [facility] messaging system. Electronically Signed   By: Sebastian Ache M.D.   On: 07/05/2017 21:59   Ir Angio Intra Extracran Sel Com Carotid Innominate Uni R Mod Sed  Result Date: 07/07/2017 INDICATION: Flaccid right and arm leg. Left gaze deviation. Dysarthria. Occluded inferior division of the left middle cerebral artery. EXAM: 1. EMERGENT LARGE VESSEL OCCLUSION THROMBOLYSIS (anterior CIRCULATION) COMPARISON:  CT angiogram of the head and neck of 07/05/2017. MEDICATIONS: Ancef 2 g IV antibiotic was administered within 1 hour of the procedure. ANESTHESIA/SEDATION: General anesthesia. CONTRAST:  Isovue 300 approximately 70 cc. FLUOROSCOPY TIME:  Fluoroscopy Time: 27 minutes 12 seconds (2951 mGy). COMPLICATIONS: None immediate. TECHNIQUE: Following a full explanation of the procedure along with the potential associated complications, an informed witnessed consent was obtained. The risks of intracranial hemorrhage of 10%, worsening neurological deficit, ventilator dependency, death and inability to revascularize were all reviewed in detail with the patient's family. The patient was then put under general anesthesia by the  Department of Anesthesiology at [facility]. The right groin was prepped and draped in the usual sterile fashion. Thereafter using modified Seldinger technique, transfemoral access into the right common femoral artery was obtained without difficulty. Over a 0.035 inch guidewire a 5 French Pinnacle sheath was inserted. Through this, and also over a 0.035 inch guidewire a 5 [place] JB 1 catheter was advanced to the aortic arch region and selectively positioned in the right common carotid artery, the right vertebral artery, the left vertebral artery and the left common carotid artery. FINDINGS: The left common carotid arteriogram demonstrates approximately 50% stenosis of the mid segment of the left common carotid artery. The left external carotid artery and its major branches are widely patent. The left internal carotid artery at the bulb demonstrates wide patency. More distally, there is mild decreased caliber of the left internal carotid artery in its entirety. The petrous, cavernous and supraclinoid segments demonstrate wide patency. The left anterior cerebral artery demonstrates complete occlusion distal to the left A1 segment. This is associated with caliber irregularity in the A1 segment. The left middle cerebral artery M1 segment proximally demonstrates mild caliber irregularity. There is patency of the superior division of the left middle cerebral artery with delayed reconstitution of the distal inferior division of the left middle cerebral artery in the M2 M3 regions. Distal to this the M3 branches and the M4 branches demonstrated gradual ascent of contrast. The inferior division appears to be completely occluded associated with a stump. A diminutive anterior temporal branch is noted. A prominent anterior choroidal artery on the left is noted with delayed arterial blush noted in the region of the splenium of the corpus callosum. The whole head delayed arterial phase in the lateral projection  demonstrates a prominent area of hypoperfusion involving the left parietal cortical and subcortical regions. The left vertebral artery origin is from the thyrocervical trunk. The vessel is seen to opacify to the cranial skull base. Wide patency is seen of the left vertebrobasilar junction and the left posterior inferior cerebellar artery. The basilar artery, the posterior cerebral arteries, the superior cerebellar arteries and the anterior-inferior cerebellar  arteries opacify into the capillary and venous phases. Prominent collaterals are seen in the P3 region of the left posterior cerebral artery retrogradely filling the posterior 1/3 of the left parietal cortical and subcortical areas. The delayed arterial phase then demonstrates retrograde opacification of the distal pericallosal artery in the splenium of the corpus callosum from the lateral thalamo perforator branches. Also demonstrated is retrograde opacification of the posterior half of the anterior temporal region from the posterior temporal branches of the P1 P2 region of the left posterior cerebral artery. The right common carotid arteriogram demonstrates mild narrowing in the mid third of the right common carotid artery. The right external carotid artery and its major branches are widely patent. The right internal carotid artery at the bulb to the cranial skull base is seen to opacify unimpeded. The petrous segment is normal. There is mild narrowing of the caval cavernous segment of the right internal carotid artery. Distal to this the supraclinoid segment demonstrates mild decrease in caliber. The right middle cerebral artery demonstrates a 50% stenosis in the distal right M1 segment. The proximal superior and the inferior divisions demonstrate moderate stenosis. Again demonstrated is a prominent anterior choroidal artery on the right side. The delayed arterial phase demonstrates hypoperfusion of the parietal cortical and subcortical regions. The right  vertebral artery origin is from the right thyrocervical trunk. Again this vessel is seen to opacify to the cranial skull base. Wide patency is seen of the right vertebrobasilar junction and the right posterior-inferior cerebellar artery. Multiple focal areas of caliber irregularity are seen involving the posterior-inferior cerebellar artery. The opacified portion of the basilar artery, the proximal posterior cerebral arteries, the superior cerebellar arteries and the anterior-inferior cerebellar arteries are seen to opacify into the capillary and venous phases. Extensive collaterals are again seen from the P3 regions of both posterior cerebral arteries contributing to the posterior parietal middle cerebral artery distributions bilaterally as described earlier. PROCEDURE: ENDOVASCULAR TREATMENT OF OCCLUDED DOMINANT INFERIOR DIVISION OF THE LEFT MIDDLE CEREBRAL ARTERY The diagnostic JB 1 catheter in the left common carotid artery was exchanged over a 0.035 inch 8 French 55 cm Brite tip neurovascular sheath using biplane roadmap technique and constant fluoroscopic guidance. Good aspiration obtained from the hub of the 8 [place] Brite tip neurovascular sheath. A gentle contrast injection demonstrated no evidence of spasms, dissections or of intraluminal filling defects. This was then connected to continuous heparinized saline infusion. Over the [facility] exchange guidewire, an 85 cm 8 [place] of balloon FlowGate guide catheter which had been prepped with 50% contrast and 50% heparinized saline infusion was advanced and positioned just proximal to the left common carotid bifurcation. The guidewire was removed. Good aspiration was obtained at the hub of the [facility] guide catheter. Over a 0.035 inch Roadrunner guidewire, using biplane roadmap technique and constant fluoroscopic guidance, the FlowGate guide catheter was then advanced to the mid cervical left ICA. The guidewire was removed. Again good aspiration was obtained from  the hub of the 8 [place] FlowGate guide catheter. A gentle control arteriogram performed centered extracranially and intracranially demonstrated no change in the intracranial circulation. Over a 0.014 inch Softip Synchro micro guidewire, a Trevo ProVue 021 microcatheter was advanced to the distal end of the of FlowGate guide catheter. With the micro guidewire leading with a J-tip configuration, the combination was navigated to the supraclinoid left ICA. The micro guidewire was then advanced gently into the inferior division origin followed by the microcatheter. A gentle manipulation of the micro guidewire was  then performed using a torque device to encourage advancement through the occluded left middle cerebral artery. The microcatheter was then brought into close proximity to the site of the occlusion. Further manipulation was performed with partial advancement of the micro guidewire through the occluded left middle cerebral artery inferior division. To avoid potential perforation of the more distal inferior division the micro guidewire was retrieved. Good aspiration obtained from the hub of the microcatheter. Approximately 3.6 mg of super selective intracranial intra-arterial Integrilin was infused over a couple of minutes. Thereafter, further attempts were made to encourage advancement of a very soft microguidewire again with only partial success. 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 The procedure was then stopped. The final control arteriogram performed through the [facility] guide catheter in the left internal carotid artery continued to demonstrate no significant change in the intracranial circulation. No angiographic evidence of extravasation or mass-effect or midline shift was seen. A Dyna CT of the head performed revealed no evidence of hemorrhage or change in the ventricular size or mass effect or midline shift. The [facility] guide catheter and the neurovascular sheath were then  exchanged over a J-tip 0.035 guidewire for an 8 [place] Pinnacle sheath. This was then removed with successful hemostasis being achieved by manual compression. At the end of the procedure, the patient's right groin appeared soft without evidence of bleeding or hematoma. Distal pulses continued to be palpable in the dorsalis pedis, and the posterior tibial regions unchanged from prior to the procedure. The patient's general anesthesia was then reversed. The patient was extubated without difficulty. Upon recovery the patient was noted to be able to raise his right arm, and also bend his right knee. He responded appropriately to simple commands. He was then transferred to the PACU and then to neuro ICU for further management. IMPRESSION: Status post attempted revascularization of occluded dominant inferior division of the right middle cerebral artery using 3.6 mg of super selective intracranial intra-arterial Integrilin and mechanical thrombolysis with a micro guidewire. PLAN: Further management as per stroke team. Electronically Signed   By: Julieanne Cotton M.D.   On: 07/06/2017 10:05   Ir Angio Vertebral Sel Vertebral Bilat Mod Sed  Result Date: 07/07/2017 INDICATION: Flaccid right and arm leg. Left gaze deviation. Dysarthria. Occluded inferior division of the left middle cerebral artery. EXAM: 1. EMERGENT LARGE VESSEL OCCLUSION THROMBOLYSIS (anterior CIRCULATION) COMPARISON:  CT angiogram of the head and neck of 07/05/2017. MEDICATIONS: Ancef 2 g IV antibiotic was administered within 1 hour of the procedure. ANESTHESIA/SEDATION: General anesthesia. CONTRAST:  Isovue 300 approximately 70 cc. FLUOROSCOPY TIME:  Fluoroscopy Time: 27 minutes 12 seconds (2951 mGy). COMPLICATIONS: None immediate. TECHNIQUE: Following a full explanation of the procedure along with the potential associated complications, an informed witnessed consent was obtained. The risks of intracranial hemorrhage of 10%, worsening neurological  deficit, ventilator dependency, death and inability to revascularize were all reviewed in detail with the patient's family. The patient was then put under general anesthesia by the Department of Anesthesiology at [facility]. The right groin was prepped and draped in the usual sterile fashion. Thereafter using modified Seldinger technique, transfemoral access into the right common femoral artery was obtained without difficulty. Over a 0.035 inch guidewire a 5 French Pinnacle sheath was inserted. Through this, and also over a 0.035 inch guidewire a 5 [place] JB 1 catheter was advanced to the aortic arch region and selectively positioned in the right common carotid artery, the right vertebral artery, the left vertebral  artery and the left common carotid artery. FINDINGS: The left common carotid arteriogram demonstrates approximately 50% stenosis of the mid segment of the left common carotid artery. The left external carotid artery and its major branches are widely patent. The left internal carotid artery at the bulb demonstrates wide patency. More distally, there is mild decreased caliber of the left internal carotid artery in its entirety. The petrous, cavernous and supraclinoid segments demonstrate wide patency. The left anterior cerebral artery demonstrates complete occlusion distal to the left A1 segment. This is associated with caliber irregularity in the A1 segment. The left middle cerebral artery M1 segment proximally demonstrates mild caliber irregularity. There is patency of the superior division of the left middle cerebral artery with delayed reconstitution of the distal inferior division of the left middle cerebral artery in the M2 M3 regions. Distal to this the M3 branches and the M4 branches demonstrated gradual ascent of contrast. The inferior division appears to be completely occluded associated with a stump. A diminutive anterior temporal branch is noted. A prominent anterior choroidal artery on  the left is noted with delayed arterial blush noted in the region of the splenium of the corpus callosum. The whole head delayed arterial phase in the lateral projection demonstrates a prominent area of hypoperfusion involving the left parietal cortical and subcortical regions. The left vertebral artery origin is from the thyrocervical trunk. The vessel is seen to opacify to the cranial skull base. Wide patency is seen of the left vertebrobasilar junction and the left posterior inferior cerebellar artery. The basilar artery, the posterior cerebral arteries, the superior cerebellar arteries and the anterior-inferior cerebellar arteries opacify into the capillary and venous phases. Prominent collaterals are seen in the P3 region of the left posterior cerebral artery retrogradely filling the posterior 1/3 of the left parietal cortical and subcortical areas. The delayed arterial phase then demonstrates retrograde opacification of the distal pericallosal artery in the splenium of the corpus callosum from the lateral thalamo perforator branches. Also demonstrated is retrograde opacification of the posterior half of the anterior temporal region from the posterior temporal branches of the P1 P2 region of the left posterior cerebral artery. The right common carotid arteriogram demonstrates mild narrowing in the mid third of the right common carotid artery. The right external carotid artery and its major branches are widely patent. The right internal carotid artery at the bulb to the cranial skull base is seen to opacify unimpeded. The petrous segment is normal. There is mild narrowing of the caval cavernous segment of the right internal carotid artery. Distal to this the supraclinoid segment demonstrates mild decrease in caliber. The right middle cerebral artery demonstrates a 50% stenosis in the distal right M1 segment. The proximal superior and the inferior divisions demonstrate moderate stenosis. Again demonstrated is a  prominent anterior choroidal artery on the right side. The delayed arterial phase demonstrates hypoperfusion of the parietal cortical and subcortical regions. The right vertebral artery origin is from the right thyrocervical trunk. Again this vessel is seen to opacify to the cranial skull base. Wide patency is seen of the right vertebrobasilar junction and the right posterior-inferior cerebellar artery. Multiple focal areas of caliber irregularity are seen involving the posterior-inferior cerebellar artery. The opacified portion of the basilar artery, the proximal posterior cerebral arteries, the superior cerebellar arteries and the anterior-inferior cerebellar arteries are seen to opacify into the capillary and venous phases. Extensive collaterals are again seen from the P3 regions of both posterior cerebral arteries contributing to the posterior  parietal middle cerebral artery distributions bilaterally as described earlier. PROCEDURE: ENDOVASCULAR TREATMENT OF OCCLUDED DOMINANT INFERIOR DIVISION OF THE LEFT MIDDLE CEREBRAL ARTERY The diagnostic JB 1 catheter in the left common carotid artery was exchanged over a 0.035 inch 8 French 55 cm Brite tip neurovascular sheath using biplane roadmap technique and constant fluoroscopic guidance. Good aspiration obtained from the hub of the 8 [place] Brite tip neurovascular sheath. A gentle contrast injection demonstrated no evidence of spasms, dissections or of intraluminal filling defects. This was then connected to continuous heparinized saline infusion. Over the [facility] exchange guidewire, an 85 cm 8 [place] of balloon FlowGate guide catheter which had been prepped with 50% contrast and 50% heparinized saline infusion was advanced and positioned just proximal to the left common carotid bifurcation. The guidewire was removed. Good aspiration was obtained at the hub of the [facility] guide catheter. Over a 0.035 inch Roadrunner guidewire, using biplane roadmap technique and  constant fluoroscopic guidance, the FlowGate guide catheter was then advanced to the mid cervical left ICA. The guidewire was removed. Again good aspiration was obtained from the hub of the 8 [place] FlowGate guide catheter. A gentle control arteriogram performed centered extracranially and intracranially demonstrated no change in the intracranial circulation. Over a 0.014 inch Softip Synchro micro guidewire, a Trevo ProVue 021 microcatheter was advanced to the distal end of the of FlowGate guide catheter. With the micro guidewire leading with a J-tip configuration, the combination was navigated to the supraclinoid left ICA. The micro guidewire was then advanced gently into the inferior division origin followed by the microcatheter. A gentle manipulation of the micro guidewire was then performed using a torque device to encourage advancement through the occluded left middle cerebral artery. The microcatheter was then brought into close proximity to the site of the occlusion. Further manipulation was performed with partial advancement of the micro guidewire through the occluded left middle cerebral artery inferior division. To avoid potential perforation of the more distal inferior division the micro guidewire was retrieved. Good aspiration obtained from the hub of the microcatheter. Approximately 3.6 mg of super selective intracranial intra-arterial Integrilin was infused over a couple of minutes. Thereafter, further attempts were made to encourage advancement of a very soft microguidewire again with only partial success. 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 The procedure was then stopped. The final control arteriogram performed through the [place] guide catheter in the left internal carotid artery continued to demonstrate no significant change in the intracranial circulation. No angiographic evidence of extravasation or mass-effect or midline shift was seen. A Dyna CT of the head performed  revealed no evidence of hemorrhage or change in the ventricular size or mass effect or midline shift. The [facility] guide catheter and the neurovascular sheath were then exchanged over a J-tip 0.035 guidewire for an 8 [place] Pinnacle sheath. This was then removed with successful hemostasis being achieved by manual compression. At the end of the procedure, the patient's right groin appeared soft without evidence of bleeding or hematoma. Distal pulses continued to be palpable in the dorsalis pedis, and the posterior tibial regions unchanged from prior to the procedure. The patient's general anesthesia was then reversed. The patient was extubated without difficulty. Upon recovery the patient was noted to be able to raise his right arm, and also bend his right knee. He responded appropriately to simple commands. He was then transferred to the PACU and then to neuro ICU for further management. IMPRESSION: Status post attempted revascularization of occluded dominant  inferior division of the right middle cerebral artery using 3.6 mg of super selective intracranial intra-arterial Integrilin and mechanical thrombolysis with a micro guidewire. PLAN: Further management as per stroke team. Electronically Signed   By: Julieanne Cotton M.D.   On: 07/06/2017 10:05   TTE  - Left ventricle: Wall thickness was increased in a pattern of mild   LVH. Systolic function was mildly to moderately reduced. The   estimated ejection fraction was in the range of 40% to 45%.   Akinesis of the apicalanteroseptal, anterolateral, and apical   myocardium. Doppler parameters are consistent with abnormal left   ventricular relaxation (grade 1 diastolic dysfunction). - Aortic valve: There was mild regurgitation.   PHYSICAL EXAM  Temp:  [98.3 F (36.8 C)-98.9 F (37.2 C)] 98.3 F (36.8 C) (04/28 0900) Pulse Rate:  [33-99] 79 (04/28 1145) Resp:  [8-24] 19 (04/28 1145) BP: (90-173)/(58-103) 101/76 (04/28 1145) SpO2:  [93 %-100 %] 98  % (04/28 1145)  General - Well nourished, well developed, in no apparent distress.  Cardiovascular - pulse and heart tones are reg today. Tele reviewed at bedside is NSR.  Neuro - awake alert, global aphasia, able to say "yes" and "OK" unreliably to all questions. Mimics some, but not all gestures.Keeps showing a thumb up and points finger on the left, but not on command. Did not follow any simple commands.  Gaze conjugate this am, crosses midline and tracks me in room to both sides,attending to right side. Blinking to visual threat on the left, however not blinking to the right.  PERRL, moderate right facial droop.  Tongue midline.  Right UE flaccid even on pain stimulation. RLE 1/5 on pain summation. LUE and LLE purposeful movement and against gravity.  DTR 1+ and no Babinski. Sensation is decreased to pain stimuli on RUE, coordination not cooperative and gait no able to test.   ASSESSMENT/PLAN Mr. STOY STROUSE is a 57 y.o. male with history of CAD/MI s/p stenting and CABG, known cardiomyopathy with EF 35-40% (now improved on current TTE), hypertension, hyperlipidemia, cognitive impairment, obesity, seizure, OSA, ESRD on hemodialysis admitted for AMS and right-sided weakness. No tPA given due to outside window. S/p IA thrombectomy on 4/21  NSTEMI and Arrythmias  Hx of CAD/MI - s/p stenting and CABG in past  Arrythmia and hypertension on evening of 4/24  EKG showed PVCs, VT, then on 4/25 remained in sbrady with bigeminy PACs. Today seems to be NSR, few PVS  Troponin 0.62->11.47 (last done on 4/25, ?peak)  K 3.4->4.0, replace as needed  Cardiology recommends long term anticoagulation, currently on Heparin gtt for 48h per NSTEMI protocol  Currently on ASA as well (was on DAPT for intracranial stenosis)  Stroke:  left MCA infarct, likely etiology is progressive large vessel atherosclerosis.  Chronic intracranial stenosis including bilateral MCAs since 2015.   Resultant global aphasia,  right hemiplegia, ongoing rehab efforts needed. Recovery will likely be incomplete despite best rehab efforts.   CT left MCA infarct  CTA head and neck left CCA 50% stenosis left M2 cutoff and diffuse intracranial stenosis. Out pt f/u with Vasc Sx recommended.   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  LDL 131  HgbA1c 4.6 Fall precautions  DIET - DYS 1 Room service appropriate? Yes; Fluid consistency: Thin   Medication list reviewed. No role in triple tx w/Heparin, ASA + Plavix, thus dropped the Plavix on 4/26  DAPT was initially started, and this would be indicated indefinitely for this chronic intracranial stenosis, however if long term anticoagulation is indicated from cardio standpoint, then would favor ASA 81mg  only in addition to anticoag.  Ongoing aggressive stroke risk factor management, stroke education when/if able (currently limited d/t global aphasia)  Therapy recommendations: SNF vs CIR  Disposition: Pending  History of stroke  12/2013, admitted for hypotension after HD, MRI showed left caudate head acute infarct, chronic bilateral BG and pontine infarcts.  MRA severe ICAD involving bilateral MCAs.  Carotid Doppler negative.  LDL 89 and A1c 5.6.  Discharged with dual antiplatelet and Crestor 40  [facility]s with [facility]. Terrace [facility] [facility]  CHF with cardiomyopathy, NSTEMI  EF 35 to 40% in 02/2017  TTE EF 40-45% this time- No thrombus noted in report. There is akinesis  2/24 pm event with tachy and VTach and other arrythmias  Trop peaked 11.7, no intervention planned at this time  Cardiology consulted, following  On Heparin gtt per cardiology, then poss transition to  anticoagulation? Need to clarify with Cards for long term plan and indication  ESRD on HD  Nephrology on board  HD scheduled MWF PTA  Following CRT  Hypertension / hypotension  Off Cardene now  Low BP noted during HD; avoid hypotension. No further role in augmentation of bp  Now on metoprolol 12.5mg  bid  Hydralazine IV PRN if there is bradycardia  SBP goal given severe intracranial stenosis: 120-150s   Hyperlipidemia  Home meds: Lipitor 40, increased to max dose 80mg  qhs  LDL 131, goal < 70  Other Stroke Risk Factors  Obstructive sleep apnea, on CPAP at home  Other Active Problems  Cognitive impairment - following with Dr. Terrace Arabia at [facility], continue home meds. High risk for delirium   ?? Seizure, not on meds  Difficulty IV access  Hospital day # 9  - Improved and stable with no further arrhythmias, ok to transfer to neuro floor now w/tele. Currently awaiting a bed on neuro floor -Cardiology now recommending long term anticoagulation  -Continue heparin gtt per cardiology, then transition to [facility] of their choice -Continue ASA for intracranial stenosis (no role in triple therapy w/OAC + ASA+ Plavix) See above. -This patient is stabilizing post NSTEMI, but patient's care continues to require constant monitoring of vital signs, hemodynamics, respiratory and cardiac monitoring, review of multiple databases, neurological assessment, discussion with family, other specialists and medical decision making of high complexity. -Seen with RN. No family present to updated. I spent 35 minutes of face-face time and coordination of care time in the care of this patient. I discussed with Dr. Desmond Lope, attending neurologist  Farheen Pfahler Metzger-Cihelka, ARNP Stroke Neurology 07/12/2017 12:03 PM   To contact Stroke Continuity provider, please refer to [email or web address]. After hours, contact General Neurology

## 2017-07-12 NOTE — Plan of Care (Signed)
Pt now in low bed with safety mats placed on the floor. Pt more calm tonight. Will continue to monitor

## 2017-07-12 NOTE — Progress Notes (Signed)
[facility] Progress Note  Dialysis Orders: MWF -[facility] 4.5hrs, BFR400, JEH631, EDW 89kg,2K/2.5Ca  Access:LU AVF Heparin4000 unit bolus w/2000 Unit midrun Venofer 50mg  IV qwk Hectorol67mcg IV qHD  Home meds: ASA/atorvastatin/clopidogrel/nitro Clonidine 0.2 BID/metoprolol tartrate 25mg  BID/terazosin [name] 1000mg  2AC TID, and 1BID snacks Uloric 40mg  qd   Assessment/Plan: 1. 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 Corliss Skains. CTA showed diffuse intracranial stenosis. 2. ESRD MWF - He has received HD T and Th this week - patient load heavy today - will write for HD - Sat HD postponed to today due to high patient load.  K 4.3 - RN changed to 2 K but will place on 3 K due to poor intake 3.   Anemia -  hgb 13.6 no esa -  4.   Secondary hyperparathyroidism - hypercalcemia Ca 10.8 > 10.2  not clear why.  Hectorol had been reduced to 2 from 3 recently- Hold dose today and resume next week if Ca comes down - plan resume sensipar 90 which may be why calcium is high - usual Ca ~10 P elevated - previously on fosrenol - might not be good with current D1 diet - try renvela powder 5.   HTN/volume -BP elevated much more so than out - our med list had clonidine 0.2 bid and MTP 25 bid but no refills documented. Try volume reduction with HD - avoid low BP 6.   Nutrition -D1 diet/vits - right handed - at baseline - unable to use 2/2  Paresis - at risk for inadequate intake/nepro ordered bid; intake inadequate  7. NSTEMI -cards following    Sheffield Slider, PA-C [facility] Beeper [phone] 07/12/2017,11:49 AM  LOS: 7 days   Subjective:   Thumbs up. No breakfast eaten.  On HD  Objective Vitals:   07/12/17 1100 07/12/17 1115 07/12/17 1130 07/12/17 1145  BP: (!) 123/93 115/71 101/75 101/76  Pulse: 90 91 83 79  Resp: 19 18 17 19   Temp:      TempSrc:      SpO2:  98%  97% 98%  Weight:      Height:       Physical Exam General: NAD breathing easily  Heart: RRR Lungs: no rales Abdomen: soft NT Extremities: no LE edema, toes very cool/left sided paresis Dialysis Access:     Additional Objective Labs: Basic Metabolic Panel: Recent Labs  Lab 07/07/17 0804  07/10/17 0403 07/11/17 0613 07/12/17 0916  NA 135   < > 137 137 136  K 3.4*   < > 3.8 3.6 4.3  CL 100*   < > 97* 93* 98*  CO2 21*   < > 26 27 26   GLUCOSE 89   < > 107* 110* 104*  BUN 26*   < > 13 27* 44*  CREATININE 10.40*   < > 7.71* 10.30* 12.88*  CALCIUM 9.2   < > 9.8 10.8* 10.2  PHOS 4.5  --   --   --  7.9*   < > = values in this interval not displayed.   Liver Function Tests: Recent Labs  Lab 07/05/17 2215 07/07/17 0804 07/08/17 2100 07/12/17 0916  AST 15  --  21  --   ALT 9*  --  <5*  --   ALKPHOS 40  --  49  --   BILITOT 1.0  --  0.9  --   PROT 7.4  --  7.0  --  ALBUMIN 3.9 3.3* 3.6 3.0*   No results for input(s): LIPASE, AMYLASE in the last 168 hours. CBC: Recent Labs  Lab 07/05/17 2215  07/06/17 0510  07/08/17 2100 07/09/17 0447 07/10/17 0403 07/11/17 0613 07/12/17 0917  WBC 14.8*  --  12.0*   < > 7.5 9.3 7.4 11.2* 10.1  NEUTROABS 12.7*  --  9.0*  --  5.7  --   --   --   --   HGB 13.2   < > 11.4*   < > 13.1 13.0 12.7* 13.6 11.2*  HCT 38.9*   < > 32.9*   < > 38.9* 39.6 38.3* 40.4 33.8*  MCV 92.4  --  91.1   < > 93.1 93.6 93.9 93.7 92.6  PLT 191  --  197   < > 229 169 250 297 263   < > = values in this interval not displayed.   Blood Culture    Component Value Date/Time   SDES BLOOD RIGHT HAND 12/29/2013 2221   SPECREQUEST BOTTLES DRAWN AEROBIC ONLY 7CC 12/29/2013 2221   CULT  12/29/2013 2221    NO GROWTH 5 DAYS Performed at [facility]   REPTSTATUS 01/05/2014 FINAL 12/29/2013 2221    Cardiac Enzymes: Recent Labs  Lab 07/08/17 2100 07/09/17 0750  TROPONINI 0.62* 11.47*   CBG: Recent Labs  Lab 07/11/17 1220 07/11/17 1706  07/11/17 1946 07/11/17 2338 07/12/17 0834  GLUCAP 101* 110* 112* 123* 101*   Iron Studies: No results for input(s): IRON, TIBC, TRANSFERRIN, FERRITIN in the last 72 hours. Lab Results  Component Value Date   INR 1.16 07/05/2017   INR 1.48 03/04/2017   INR 1.27 12/29/2013   Studies/Results: No results found. Medications: . sodium chloride    . sodium chloride    . sodium chloride    . sodium chloride    . heparin 1,000 Units/hr (07/12/17 0900)   .  stroke: mapping our early stages of recovery book   Does not apply Once  . aspirin  324 mg Oral Daily  . atorvastatin  80 mg Oral Daily  . cinacalcet  90 mg Oral Q supper  . doxercalciferol      . febuxostat  40 mg Oral Daily  . feeding supplement (NEPRO CARB STEADY)  237 mL Oral BID BM  . mouth rinse  15 mL Mouth Rinse BID  . memantine  10 mg Oral BID  . metoprolol tartrate  12.5 mg Oral BID  . nitroGLYCERIN  1 inch Topical Q6H

## 2017-07-12 NOTE — Progress Notes (Signed)
Called for troponin 1.88. This was checked by Dr. Mayford Knife to assess trend; this is down from 11.47. Continue plan as outlined in her note. Geana Walts PA-C

## 2017-07-12 NOTE — Progress Notes (Addendum)
Progress Note  Patient Name: [name] Date of Encounter: 07/12/2017  Primary Cardiologist: No primary care provider on file.   Subjective   No verbal communication.  Cannot assess whether he has CP or SOB  Inpatient Medications    Scheduled Meds: .  stroke: mapping our early stages of recovery book   Does not apply Once  . aspirin  324 mg Oral Daily  . atorvastatin  80 mg Oral Daily  . cinacalcet  90 mg Oral Q supper  . doxercalciferol      . febuxostat  40 mg Oral Daily  . feeding supplement (NEPRO CARB STEADY)  237 mL Oral BID BM  . mouth rinse  15 mL Mouth Rinse BID  . memantine  10 mg Oral BID  . metoprolol tartrate  12.5 mg Oral BID  . nitroGLYCERIN  1 inch Topical Q6H   Continuous Infusions: . sodium chloride    . sodium chloride    . sodium chloride    . sodium chloride    . heparin 1,000 Units/hr (07/12/17 0900)   PRN Meds: sodium chloride, sodium chloride, sodium chloride, sodium chloride, acetaminophen **OR** acetaminophen (TYLENOL) oral liquid 160 mg/5 mL **OR** acetaminophen, alteplase, heparin, heparin, hydrALAZINE, lidocaine (PF), lidocaine-prilocaine, ondansetron (ZOFRAN) IV, pentafluoroprop-tetrafluoroeth   Vital Signs    Vitals:   07/12/17 0900 07/12/17 0907 07/12/17 0915 07/12/17 0930  BP: (!) 148/88 (!) 128/103 (!) 170/100 (!) 131/97  Pulse: 74 78 85 79  Resp: 10 11 (!) 22 13  Temp: 98.3 F (36.8 C)     TempSrc: Oral     SpO2: 100%   100%  Weight:      Height:        Intake/Output Summary (Last 24 hours) at 07/12/2017 0946 Last data filed at 07/12/2017 0900 Gross per 24 hour  Intake 390 ml  Output -  Net 390 ml   Filed Weights   07/07/17 1138 07/09/17 1513 07/09/17 1920  Weight: 193 lb 9 oz (87.8 kg) 198 lb 6.6 oz (90 kg) 196 lb 3.4 oz (89 kg)    Telemetry    NSR - Personally Reviewed  ECG    No new EKG to review- Personally Reviewed  Physical Exam   GEN: No acute distress.   Neck: No JVD Cardiac: RRR, no murmurs,  rubs, or gallops.  Respiratory: Clear to auscultation bilaterally. GI: Soft, nontender, non-distended  MS: No edema; No deformity. Neuro:   Unable to assess Psych: Unable to assess  Labs    Chemistry Recent Labs  Lab 07/05/17 2215  07/07/17 0804  07/08/17 2100 07/09/17 0750 07/10/17 0403 07/11/17 0613  NA 136   < > 135   < > 136 137 137 137  K 4.0   < > 3.4*   < > 3.4* 4.0 3.8 3.6  CL 94*   < > 100*   < > 95* 98* 97* 93*  CO2 20*   < > 21*   < > 25 21* 26 27  GLUCOSE 135*   < > 89   < > 147* 95 107* 110*  BUN 39*   < > 26*   < > 23* 27* 13 27*  CREATININE 14.46*   < > 10.40*   < > 11.20* 12.24* 7.71* 10.30*  CALCIUM 10.7*   < > 9.2   < > 10.4* 10.5* 9.8 10.8*  PROT 7.4  --   --   --  7.0  --   --   --  ALBUMIN 3.9  --  3.3*  --  3.6  --   --   --   AST 15  --   --   --  21  --   --   --   ALT 9*  --   --   --  <5*  --   --   --   ALKPHOS 40  --   --   --  49  --   --   --   BILITOT 1.0  --   --   --  0.9  --   --   --   GFRNONAA 3*   < > 5*   < > 4* 4* 7* 5*  GFRAA 4*   < > 6*   < > 5* 5* 8* 6*  ANIONGAP 22*   < > 14   < > 16* 18* 14 17*   < > = values in this interval not displayed.     Hematology Recent Labs  Lab 07/10/17 0403 07/11/17 0613 07/12/17 0917  WBC 7.4 11.2* 10.1  RBC 4.08* 4.31 3.65*  HGB 12.7* 13.6 11.2*  HCT 38.3* 40.4 33.8*  MCV 93.9 93.7 92.6  MCH 31.1 31.6 30.7  MCHC 33.2 33.7 33.1  RDW 14.9 14.7 14.4  PLT 250 297 263    Cardiac Enzymes Recent Labs  Lab 07/08/17 2100 07/09/17 0750  TROPONINI 0.62* 11.47*    Recent Labs  Lab 07/05/17 2231  TROPIPOC 0.08     BNPNo results for input(s): BNP, PROBNP in the last 168 hours.   DDimer No results for input(s): DDIMER in the last 168 hours.   Radiology    No results found.  Cardiac Studies   Echo 07/06/17  Study Conclusions  - Left ventricle: Wall thickness was increased in a pattern of mild LVH. Systolic function was mildly to moderately reduced. The estimated  ejection fraction was in the range of 40% to 45%. Akinesis of the apicalanteroseptal, anterolateral, and apical myocardium. Doppler parameters are consistent with abnormal left ventricular relaxation (grade 1 diastolic dysfunction). - Aortic valve: There was mild regurgitation.  Patient Profile     57 y.o. male with history of CAD (s/p CABG in 2013), ESRD, HTN, CVA, HL, GERD and anemia Admitted with CVA with R sided hemiplegia  Assessment & Plan    1.  NSTEMI  --Troponin 11.47 but has not peaked --continue to check trop until peak has occurred --Doesn't communicate verbally so I cannot assess for sx of angina --mildly reduced LVF on echo with apical anterolateral, anterolateral and apical myocardium and likely small LV thrombus by Dr. Tenny Craw review --Now on IV Heparin gtt and ultimately will needs to be started on Coumadin for LV thrombus --Continue aspirin, beta-blocker and high-dose statin --medical management for now in setting of acute CVA  2. S/P L MCA CVA Failed thrombectomy  -- per Neuro  3. HTN  --BP still running high -BP earlier this morning was 170/100 and now 131/97 mmHg --BP parameters per neuro with passive HTN (per neuro SBP goal 120-150s given severe intracranial stenosis) --continue Lopressor 12.5 mg twice daily  5 Acute on chronic systolic CHF --EF 40-45% with apical AK --volume managed by nephrology --continue BB --would benefit from ACE I if ok with neuro and renal    For questions or updates, please contact [facility] Please consult [email or web address] for contact info under Cardiology/STEMI.      Signed, Armanda Magic, MD  07/12/2017, 9:46 AM

## 2017-07-12 NOTE — Procedures (Signed)
Usual HD treatment. BP dropped during treatment with mild UF. Will stop UF and give volume given comorbidities. D/W RN at bedside Casimiro Needle, MD

## 2017-07-12 NOTE — Progress Notes (Signed)
ANTICOAGULATION CONSULT NOTE  Pharmacy Consult for heparin Indication: LV thrombus/CVA   Heparin Dosing Weight: 86.8 kg  Labs: Recent Labs    07/10/17 0403 07/10/17 1438 07/11/17 0613 07/12/17 0916 07/12/17 0917 07/12/17 1138  HGB 12.7*  --  13.6  --  11.2*  --   HCT 38.3*  --  40.4  --  33.8*  --   PLT 250  --  297  --  263  --   HEPARINUNFRC 0.28* <0.10* 0.57  --   --  0.34  CREATININE 7.71*  --  10.30* 12.88*  --   --     Assessment: 20 yom presenting with CVA (did not receive TPA, s/p mechanical thrombectomy) with subsequent tachyarrhythmias and elevation in troponin. Pharmacy dosing IV heparin for ACS. CCM spoke with Neuro and no contraindication to anticoagulation.  Heparin level this AM is therapeutic at 0.34 on 1000 units/hr. The level was drawn at the start of HD. H/H down. Plt wnl    Goal of Therapy:  Heparin level 0.3-0.5 units/ml, no boluses Monitor platelets by anticoagulation protocol: Yes   Plan:  Continue heparin at 1000 units/h Daily heparin level/CBC Monitor s/sx bleeding  Vinnie Level, PharmD., BCPS Clinical Pharmacist Clinical phone for 07/10/17 until 3:30pm: [phone] If after 3:30pm, please call main pharmacy at: [phone]

## 2017-07-13 LAB — GLUCOSE, CAPILLARY
GLUCOSE-CAPILLARY: 123 mg/dL — AB (ref 65–99)
Glucose-Capillary: 71 mg/dL (ref 65–99)
Glucose-Capillary: 94 mg/dL (ref 65–99)
Glucose-Capillary: 92 mg/dL (ref 65–99)

## 2017-07-13 LAB — RENAL FUNCTION PANEL
Albumin: 2.9 g/dL — ABNORMAL LOW (ref 3.5–5.0)
Anion gap: 9 (ref 5–15)
BUN: 23 mg/dL — AB (ref 6–20)
CALCIUM: 9.7 mg/dL (ref 8.9–10.3)
CHLORIDE: 97 mmol/L — AB (ref 101–111)
CO2: 29 mmol/L (ref 22–32)
CREATININE: 8.04 mg/dL — AB (ref 0.61–1.24)
GFR calc Af Amer: 8 mL/min — ABNORMAL LOW (ref >60)
GFR calc non Af Amer: 7 mL/min — ABNORMAL LOW (ref >60)
Glucose, Bld: 85 mg/dL (ref 65–99)
Phosphorus: 5.1 mg/dL — ABNORMAL HIGH (ref 2.5–4.6)
Potassium: 4.2 mmol/L (ref 3.5–5.1)
SODIUM: 135 mmol/L (ref 135–145)

## 2017-07-13 LAB — PROTIME-INR
INR: 1
PROTHROMBIN TIME: 13.1 s (ref 11.4–15.2)

## 2017-07-13 LAB — HEPARIN LEVEL (UNFRACTIONATED)
HEPARIN UNFRACTIONATED: 0.28 [IU]/mL — AB (ref 0.30–0.70)
HEPARIN UNFRACTIONATED: 0.25 [IU]/mL — AB (ref 0.30–0.70)
Heparin Unfractionated: 0.39 IU/mL (ref 0.30–0.70)

## 2017-07-13 LAB — TROPONIN I: TROPONIN I: 1.66 ng/mL — AB (ref <0.03)

## 2017-07-13 MED ORDER — WARFARIN - PHARMACIST DOSING INPATIENT
Freq: Every day | Status: DC
  Administered 2017-07-15 – 2017-07-16 (×2)

## 2017-07-13 MED ORDER — IOHEXOL 300 MG/ML  SOLN: 100.0000 mL | Freq: Once | INTRAMUSCULAR | Status: DC | PRN

## 2017-07-13 MED ORDER — WARFARIN SODIUM 5 MG PO TABS
5.0000 mg | ORAL_TABLET | Freq: Once | ORAL | Status: AC
  Administered 2017-07-13: 5 mg via ORAL
  Filled 2017-07-13: qty 1

## 2017-07-13 NOTE — Progress Notes (Signed)
ANTICOAGULATION CONSULT NOTE  Pharmacy Consult for heparin Indication: LV thrombus/CVA   Heparin Dosing Weight: 86.8 kg  Labs: Recent Labs    07/11/17 0613 07/12/17 0916 07/12/17 0917  07/12/17 1432 07/12/17 2139 07/13/17 0226 07/13/17 0734 07/13/17 1419 07/13/17 2018  HGB 13.6  --  11.2*  --   --   --   --   --   --   --   HCT 40.4  --  33.8*  --   --   --   --   --   --   --   PLT 297  --  263  --   --   --   --   --   --   --   LABPROT  --   --   --   --   --   --   --   --  13.1  --   INR  --   --   --   --   --   --   --   --  1.00  --   HEPARINUNFRC 0.57  --   --    < >  --   --  0.28*  --  0.39 0.25*  CREATININE 10.30* 12.88*  --   --   --   --   --  8.04*  --   --   TROPONINI  --   --   --   --  1.88* 1.84* 1.66*  --   --   --    < > = values in this interval not displayed.    Assessment: [name] presenting with CVA (did not receive TPA, s/p mechanical thrombectomy) with subsequent tachyarrhythmias and elevation in troponin. Pharmacy dosing IV heparin for ACS. CCM spoke with Neuro and no contraindication to anticoagulation.  Heparin level is down to 0.25 on 1050 units/hr. Also to start coumadin for LV thrombus, INR 1.0. No interruption with infusion per RN  Goal of Therapy:  Heparin level 0.3-0.5 units/ml, no boluses Monitor platelets by anticoagulation protocol: Yes   Plan:  Increase heparin rate to 1150 units/hr Coumadin 5mg  tonight F/u heparin level at 0700 Daily heparin level/CBC and PT/INR Monitor s/sx bleeding  Bayard Hugger, PharmD, BCPS  Clinical Pharmacist  Pager: [phone]

## 2017-07-13 NOTE — Progress Notes (Addendum)
ANTICOAGULATION CONSULT NOTE  Pharmacy Consult for heparin Indication: LV thrombus/CVA   Heparin Dosing Weight: 86.8 kg  Labs: Recent Labs    07/11/17 0613 07/12/17 0916 07/12/17 0917 07/12/17 1138 07/12/17 1432 07/12/17 2139 07/13/17 0226  HGB 13.6  --  11.2*  --   --   --   --   HCT 40.4  --  33.8*  --   --   --   --   PLT 297  --  263  --   --   --   --   HEPARINUNFRC 0.57  --   --  0.34  --   --  0.28*  CREATININE 10.30* 12.88*  --   --   --   --   --   TROPONINI  --   --   --   --  1.88* 1.84* 1.66*    Assessment: 57 yom presenting with CVA (did not receive TPA, s/p mechanical thrombectomy) with subsequent tachyarrhythmias and elevation in troponin. Pharmacy dosing IV heparin for ACS. CCM spoke with Neuro and no contraindication to anticoagulation.  Heparin level this AM is subtherapeutic at 0.28 on 1000 units/hr. Plt wnl    Goal of Therapy:  Heparin level 0.3-0.5 units/ml, no boluses Monitor platelets by anticoagulation protocol: Yes   Plan:  Increase heparin to 1050 units/h HL in 6 hours Daily heparin level/CBC Monitor s/sx bleeding  Tracee Mccreery A. Jeanella Craze, PharmD, BCPS Clinical Pharmacist [place] Pager: [phone]  Clinical phone for 07/10/17 until 3:30pm: [phone] If after 3:30pm, please call main pharmacy at: [phone]   Addendum: 6 hour post-increase heparin level=0.39, Maintain current rate of 1050 units/hr and get confirmatory heparin level in 6 hours.

## 2017-07-13 NOTE — Consult Note (Signed)
   [facility] Inpatient Consult   07/13/2017  [name] [birth year] [id number]    [facility] Care Management follow up.   Spoke with patient's sister, Quatavious Rossa about [facility] Care Management program. Discussed [facility] Care Management LCSW follow up while at [facility]. [facility] Care Management written consent obtained by Jessie Foot (sister). Mr. Ditter has aphasia.  Also discussed long term plans with sister. [name] states she is not sure what the long term plan is but she knows Mr. Cochrane is not able to stay home alone any longer. Not sure what SNF facility he will go to. [name] states she prefers [facility]. Explained that inpatient LCSW facilitates SNF transfers from hospital.    [facility] Care Management program and folder provided. Will await to make [facility] LCSW referral once disposition facility is known.   Of note, Fox Salminen (sister) is primary contact at [phone].  Raiford Noble, MSN-Ed, RN,BSN [facility] Care Liaison [phone]

## 2017-07-13 NOTE — Progress Notes (Signed)
[facility] Progress Note  Subjective: no c/o, silent, gives thumbs up to questions  Vitals:   07/12/17 2027 07/12/17 2337 07/13/17 0418 07/13/17 0827  BP: (!) 147/102 131/85 (!) 155/86 (!) 125/92  Pulse: 92 71 75 93  Resp: 20 18 18 20   Temp: 98.7 F (37.1 C) 98.6 F (37 C) 97.8 F (36.6 C)   TempSrc: Oral Oral Oral   SpO2: 100% 92% 100%   Weight:      Height:        Inpatient medications: .  stroke: mapping our early stages of recovery book   Does not apply Once  . aspirin  324 mg Oral Daily  . atorvastatin  80 mg Oral Daily  . cinacalcet  90 mg Oral Q supper  . febuxostat  40 mg Oral Daily  . feeding supplement (NEPRO CARB STEADY)  237 mL Oral BID BM  . mouth rinse  15 mL Mouth Rinse BID  . memantine  10 mg Oral BID  . metoprolol tartrate  12.5 mg Oral BID  . nitroGLYCERIN  1 inch Topical Q6H  . sevelamer carbonate  2.4 g Oral TID WC   . sodium chloride    . sodium chloride    . sodium chloride    . sodium chloride    . heparin 1,050 Units/hr (07/13/17 0813)   sodium chloride, sodium chloride, sodium chloride, sodium chloride, acetaminophen **OR** acetaminophen (TYLENOL) oral liquid 160 mg/5 mL **OR** acetaminophen, alteplase, heparin, heparin, hydrALAZINE, lidocaine (PF), lidocaine-prilocaine, pentafluoroprop-tetrafluoroeth  Exam: Alert, aphasic, gives the thumbs-up response to most questions No jvd Chest clear bilat Cor reg no rg Abd soft ntnd no mass or ascites Ext no edema Ox 3, right hemiparesis dense Left upper avf w bruit   Dialysis: [facility] 4.5h  400/800  89kg  2/2.5 bath  Left upper avf  Hep 4000 then 2000 midrun Venofer 50mg  IV qwk Hectorol46mcg IV qHD  Home meds: ASA/atorvastatin/clopidogrel/nitro Clonidine 0.2 BID/metoprolol tartrate 25mg  BID/terazosin [name] 1000mg  2AC TID, and 1BID snacks Uloric 40mg  qd      Impression: 1  Left brain cva w/ right hemiparesis and aphasia 2  esrd on hd mwf 3  Anemia ckd Hb up 13, no  esa 4  mbd ckd calc levels high, holding vdra 5  Htn/ vol is at dry wt, bp's up, cont BB (home clon/ terazosin on hold) 6  nstemi per cardiology   Plan - dialysis today   Vinson Moselle MD [facility] pager [phone]   07/13/2017, 2:42 PM   Recent Labs  Lab 07/07/17 0804  07/11/17 0613 07/12/17 0916 07/13/17 0734  NA 135   < > 137 136 135  K 3.4*   < > 3.6 4.3 4.2  CL 100*   < > 93* 98* 97*  CO2 21*   < > 27 26 29   GLUCOSE 89   < > 110* 104* 85  BUN 26*   < > 27* 44* 23*  CREATININE 10.40*   < > 10.30* 12.88* 8.04*  CALCIUM 9.2   < > 10.8* 10.2 9.7  PHOS 4.5  --   --  7.9* 5.1*   < > = values in this interval not displayed.   Recent Labs  Lab 07/08/17 2100 07/12/17 0916 07/13/17 0734  AST 21  --   --   ALT <5*  --   --   ALKPHOS 49  --   --   BILITOT 0.9  --   --  PROT 7.0  --   --   ALBUMIN 3.6 3.0* 2.9*   Recent Labs  Lab 07/08/17 2100  07/10/17 0403 07/11/17 0613 07/12/17 0917  WBC 7.5   < > 7.4 11.2* 10.1  NEUTROABS 5.7  --   --   --   --   HGB 13.1   < > 12.7* 13.6 11.2*  HCT 38.9*   < > 38.3* 40.4 33.8*  MCV 93.1   < > 93.9 93.7 92.6  PLT 229   < > 250 297 263   < > = values in this interval not displayed.   Iron/TIBC/Ferritin/ %Sat    Component Value Date/Time   IRON 35 (L) 03/05/2017 0106   TIBC 182 (L) 03/05/2017 0106   FERRITIN 844 (H) 03/05/2017 0106   IRONPCTSAT 19 03/05/2017 0106

## 2017-07-13 NOTE — Progress Notes (Signed)
Physical Therapy Treatment Patient Details Name: [name] MRN: [id number] DOB: 1960/04/03 Today's Date: 07/13/2017    History of Present Illness 57 y.o. male with a history of ESRD, previous stroke. Presenting with new right weakness, aphasia, and neglect. MRI showing acute left MCA territory cortical infarct     PT Comments    +2 max assist for bed to recliner transfer. Pt non verbal and has R sided neglect. No functional movement noted in RUE/LE. Performed PROM RUE/LE.   Follow Up Recommendations  SNF(pt will have no one that can stay with him after d/c)     Equipment Recommendations  Other (comment)(TBA)    Recommendations for Other Services Rehab consult     Precautions / Restrictions Precautions Precautions: Fall Restrictions Weight Bearing Restrictions: No    Mobility  Bed Mobility Overal bed mobility: Needs Assistance Bed Mobility: Supine to Sit     Supine to sit: Mod assist;+2 for safety/equipment     General bed mobility comments: assist to advance LEs, raise trunk, pad used to pivot hips to edge of bed  Transfers Overall transfer level: Needs assistance Equipment used: None(chairback) Transfers: Sit to/from [employer] Sit to Stand: Mod assist;+2 physical assistance Stand pivot transfers: Max assist;+2 physical assistance       General transfer comment: max A for balance and to pivot on LLE, RLE buckles with attempted weight bearing  Ambulation/Gait                 Stairs             Wheelchair Mobility    Modified Rankin (Stroke Patients Only) Modified Rankin (Stroke Patients Only) Pre-Morbid Rankin Score: No symptoms Modified Rankin: Severe disability     Balance Overall balance assessment: Needs assistance Sitting-balance support: No upper extremity supported;Feet supported;Single extremity supported Sitting balance-Leahy Scale: Fair Sitting balance - Comments: pt initially leaning heavily right.   Over 10 minutes of sitting between standing trials and with some left sided inhibition, pt can be assisted toward midline.  He was unable to hold midline without significant assist   Standing balance support: During functional activity;Single extremity supported Standing balance-Leahy Scale: Poor Standing balance comment: assist needed for standing balance                            Cognition Arousal/Alertness: Awake/alert Behavior During Therapy: Flat affect Overall Cognitive Status: Impaired/Different from baseline Area of Impairment: Attention;Memory;Following commands;Safety/judgement;Awareness;Problem solving                   Current Attention Level: Focused;Sustained;Selective Memory: Decreased recall of precautions Following Commands: Follows multi-step commands consistently Safety/Judgement: Decreased awareness of safety;Decreased awareness of deficits Awareness: Intellectual Problem Solving: Slow processing;Decreased initiation;Difficulty sequencing;Requires verbal cues;Requires tactile cues General Comments: little to no attention directed to the right side unless maximal cues to direct attention. Pt did not verbalize during session but did respond to questions using L hand for thumbs up/down and shook head for yes/no.       Exercises General Exercises - Upper Extremity Shoulder Flexion: PROM;Right;10 reps;Seated General Exercises - Lower Extremity Ankle Circles/Pumps: PROM;Right;10 reps;Supine Long Arc Quad: PROM;Right;10 reps;Seated    General Comments        Pertinent Vitals/Pain Pain Assessment: No/denies pain Faces Pain Scale: No hurt    Home Living                      Prior Function  PT Goals (current goals can now be found in the care plan section) Acute Rehab PT Goals Patient Stated Goal: none stated PT Goal Formulation: Patient unable to participate in goal setting Time For Goal Achievement: 07/20/17 Potential  to Achieve Goals: Fair Progress towards PT goals: Progressing toward goals    Frequency    Min 3X/week      PT Plan Current plan remains appropriate    Co-evaluation              AM-PAC PT "6 Clicks" Daily Activity  Outcome Measure  Difficulty turning over in bed (including adjusting bedclothes, sheets and blankets)?: Unable Difficulty moving from lying on back to sitting on the side of the bed? : Unable Difficulty sitting down on and standing up from a chair with arms (e.g., wheelchair, bedside commode, etc,.)?: Unable Help needed moving to and from a bed to chair (including a wheelchair)?: A Lot Help needed walking in hospital room?: Total Help needed climbing 3-5 steps with a railing? : Total 6 Click Score: 7    End of Session Equipment Utilized During Treatment: Gait belt Activity Tolerance: Patient tolerated treatment well Patient left: with call bell/phone within reach;in chair;with chair alarm set Nurse Communication: Mobility status PT Visit Diagnosis: Other symptoms and signs involving the nervous system (R29.898);Other abnormalities of gait and mobility (R26.89);Unsteadiness on feet (R26.81)     Time: 1610-9604 PT Time Calculation (min) (ACUTE ONLY): 22 min  Charges:  $Therapeutic Activity: 8-22 mins                    G Codes:          Tamala Ser 07/13/2017, 2:17 PM [phone]

## 2017-07-13 NOTE — NC FL2 (Addendum)
[place] MEDICAID FL2 LEVEL OF CARE SCREENING TOOL     IDENTIFICATION  Patient Name: [name] Birthdate: 1960/10/03 Sex: male Admission Date (Current Location): 07/05/2017  [facility] and [place] Number:  Producer, television/film/video and Address:  [facility], [address], [place], [place] [address]      Provider Number: [id number]  Attending Physician Name and Address:  Micki Riley, MD  Relative Name and Phone Number:       Current Level of Care: Hospital Recommended Level of Care: Skilled Nursing Facility Prior Approval Number:    Date Approved/Denied:   PASRR Number: [id number]  Discharge Plan: SNF    Current Diagnoses: Patient Active Problem List   Diagnosis Date Noted  . NSTEMI (non-ST elevated myocardial infarction) (HCC)   . LV (left ventricular) mural thrombus following MI (HCC)   . Chronic combined systolic and diastolic congestive heart failure (HCC) 07/08/2017  . Acute ischemic left MCA stroke (HCC)   . History of CVA (cerebrovascular accident)   . Dysphagia, post-stroke   . Diastolic dysfunction   . Anemia of chronic disease   . 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 Cardiomyopathy (HCC) 05/12/2017  . Thrombocytopenia (HCC) 03/04/2017  . Elevated PSA 11/04/2016  . OSA (obstructive sleep apnea) 01/08/2016  . Mild cognitive impairment 12/11/2015  . Sleep disorder breathing 10/31/2015  . Urethritis 07/17/2015  . End stage renal disease on dialysis (HCC) 12/06/2014  . Pulse irregularity 09/13/2014  . History of stroke 04/15/2014  . Nail disease 01/22/2014  . CVD (cerebrovascular disease) 12/29/2013  . Acute ischemic left ACA stroke (HCC) 12/29/2013  . Cerebrovascular accident (CVA) due to embolism of middle cerebral artery (HCC) 12/29/2013  . Abnormality of gait 12/22/2013  . Exertional angina (HCC) 06/24/2013  . Venereal disease, unspecified 11/10/2012  . ESRD on dialysis (HCC) 07/09/2012  . Left-sided  weakness 07/02/2012  . Subclavian artery stenosis (HCC) 03/26/2012  . Left leg weakness 02/04/2012  . Itching 02/04/2012  . Mouth abscess 11/11/2011  . Increased prostate specific antigen (PSA) velocity 11/11/2011  . Right ankle pain 09/10/2011  . S/P coronary artery stent placement, PTCA/DES Resolute to mid-LAD via the LIMA graft, and PTCA of apical 95% stenosis 06/2011 07/04/2011  . History of diastolic dysfunction, grade 2 by echo 07/03/2011  . S/P PTCA (percutaneous transluminal coronary angioplasty), 06/30/11 07/01/2011  . Stable angina: with acute T wave inversion.And unstable angina 06/30/2011  . Impaired glucose tolerance 05/13/2011  . 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 Preventative health care 05/10/2011  . S/P CABG x 4: 04/01/11-(LIMA to LAD, SVG to PL branch of RCA, Sequential SVG to OM1/2) 04/07/2011  . CAD, 3 vessel significant disease at cath this admission. underwent CABG in Jan 2013 03/28/2011  . Gout 03/27/2011  . Anemia 05/22/2008  . PERIPHERAL VASCULAR DISEASE 05/22/2008  . ACHALASIA 05/22/2008  . GERD 05/22/2008  . Hyperlipidemia 10/25/2006  . Depression 10/25/2006  . Essential hypertension 10/25/2006  . HEMORRHOIDS 10/25/2006  . ALLERGIC RHINITIS 10/25/2006  . DEGENERATIVE JOINT DISEASE, SHOULDER 10/25/2006  . Headache(784.0) 10/25/2006  . CVA (cerebral infarction) 10/25/2006  . RENAL CALCULUS, HX OF 10/25/2006    Orientation RESPIRATION BLADDER Height & Weight        Other (Comment)(home CPAP at night) Incontinent Weight: 196 lb 3.4 oz (89 kg) Height:  5\' 11"  (180.3 cm)  BEHAVIORAL SYMPTOMS/MOOD NEUROLOGICAL BOWEL NUTRITION STATUS      Continent Diet(see DC summary)  AMBULATORY STATUS COMMUNICATION OF NEEDS  Skin   Extensive Assist Verbally Normal                       Personal Care Assistance Level of Assistance  Bathing, Dressing Bathing Assistance: Maximum assistance   Dressing Assistance: Maximum assistance     Functional Limitations  Info             SPECIAL CARE FACTORS FREQUENCY  PT (By licensed PT), OT (By licensed OT), Speech therapy     PT Frequency: 5/wk OT Frequency: 5/wk     Speech Therapy Frequency: 3/wk      Contractures      Additional Factors Info  Code Status, Allergies, Isolation Precautions Code Status Info: FULL Allergies Info: Shrimp Shellfish Allergy, Atorvastatin, Insulins, Simvastatin, Ultram Tramadol     Isolation Precautions Info: MRSA     Current Medications (07/13/2017):  This is the current hospital active medication list Current Facility-Administered Medications  Medication Dose Route Frequency Provider Last Rate Last Dose  .  stroke: mapping our early stages of recovery book   Does not apply Once Rejeana Brock, MD      . 0.9 %  sodium chloride infusion  100 mL Intravenous PRN Lauris Poag, MD      . 0.9 %  sodium chloride infusion  100 mL Intravenous PRN Lauris Poag, MD      . 0.9 %  sodium chloride infusion  100 mL Intravenous PRN Weston Settle, PA-C      . 0.9 %  sodium chloride infusion  100 mL Intravenous PRN Weston Settle, PA-C      . acetaminophen (TYLENOL) tablet 650 mg  650 mg Oral Q4H PRN Rejeana Brock, MD   650 mg at 07/10/17 2018   Or  . acetaminophen (TYLENOL) solution 650 mg  650 mg Per Tube Q4H PRN Rejeana Brock, MD       Or  . acetaminophen (TYLENOL) suppository 650 mg  650 mg Rectal Q4H PRN Rejeana Brock, MD      . alteplase (CATHFLO ACTIVASE) injection 2 mg  2 mg Intracatheter Once PRN Weston Settle, PA-C      . aspirin chewable tablet 324 mg  324 mg Oral Daily Marvel Plan, MD   324 mg at 07/13/17 1038  . atorvastatin (LIPITOR) tablet 80 mg  80 mg Oral Daily Marvel Plan, MD   80 mg at 07/13/17 1038  . cinacalcet (SENSIPAR) tablet 90 mg  90 mg Oral Q supper Weston Settle, PA-C      . febuxostat (ULORIC) tablet 40 mg  40 mg Oral Daily Rejeana Brock, MD   40 mg at 07/13/17 1038  . feeding supplement  (NEPRO CARB STEADY) liquid 237 mL  237 mL Oral BID BM Marvel Plan, MD 0 mL/hr at 07/12/17 2242 237 mL at 07/13/17 1000  . heparin ADULT infusion 100 units/mL (25000 units/291mL sodium chloride 0.45%)  1,050 Units/hr Intravenous Continuous Pierce, Dwayne A, RPH 10.5 mL/hr at 07/13/17 0813 1,050 Units/hr at 07/13/17 0813  . heparin injection 1,000 Units  1,000 Units Dialysis PRN Weston Settle, PA-C      . heparin injection 1,800 Units  20 Units/kg Dialysis PRN Weston Settle, PA-C      . hydrALAZINE (APRESOLINE) injection 5-10 mg  5-10 mg Intravenous Q2H PRN Marvel Plan, MD   10 mg at 07/10/17 2307  . iohexol (OMNIPAQUE) 300 MG/ML solution 100 mL  100 mL Intravenous Once PRN Lorre Nick, MD      .  lidocaine (PF) (XYLOCAINE) 1 % injection 5 mL  5 mL Intradermal PRN Weston Settle, PA-C      . lidocaine-prilocaine (EMLA) cream 1 application  1 application Topical PRN Weston Settle, PA-C      . MEDLINE mouth rinse  15 mL Mouth Rinse BID Rejeana Brock, MD   15 mL at 07/13/17 1040  . memantine (NAMENDA) tablet 10 mg  10 mg Oral BID Rejeana Brock, MD   10 mg at 07/13/17 1038  . metoprolol tartrate (LOPRESSOR) tablet 12.5 mg  12.5 mg Oral BID Marcelle Smiling, MD   12.5 mg at 07/13/17 1038  . nitroGLYCERIN (NITROGLYN) 2 % ointment 1 inch  1 inch Topical Q6H Lynnell Jude, MD   1 inch at 07/13/17 1211  . pentafluoroprop-tetrafluoroeth (GEBAUERS) aerosol 1 application  1 application Topical PRN Weston Settle, PA-C      . sevelamer carbonate (RENVELA) powder PACK 2.4 g  2.4 g Oral TID WC Weston Settle, PA-C   2.4 g at 07/13/17 1209     Discharge Medications: Please see discharge summary for a list of discharge medications.  Relevant Imaging Results:  Relevant Lab Results:   Additional Information SS#: [id number]; dialysis MWF at [facility], [place], [place]  I have personally examined this patient, reviewed notes, independently viewed imaging studies, participated in  medical decision making and plan of care.ROS completed by me personally and pertinent positives fully documented  I have made any additions or clarifications directly to the above note. Agree with note above.   Delia Heady, MD Medical Director [facility] Pager: [phone] 07/14/2017 3:33 PM

## 2017-07-13 NOTE — Progress Notes (Addendum)
STROKE TEAM PROGRESS NOTE   SUBJECTIVE (INTERVAL HISTORY) Neuro exam stable, slowly improving. Heparin gtt continues per cardiology for NSTEMI. Not speaking, unsure it is all organic. Will start coumadin. Plan SNF.   OBJECTIVE Temp:  [97.5 F (36.4 C)-98.7 F (37.1 C)] 97.8 F (36.6 C) (04/29 0418) Pulse Rate:  [61-93] 93 (04/29 0827) Cardiac Rhythm: Normal sinus rhythm (04/29 0700) Resp:  [16-20] 20 (04/29 0827) BP: (117-161)/(80-102) 125/92 (04/29 0827) SpO2:  [92 %-100 %] 100 % (04/29 0418)  Recent Labs  Lab 07/12/17 0834 07/12/17 1150 07/12/17 2156 07/13/17 0623 07/13/17 1142  GLUCAP 101* 93 102* 71 94   Recent Labs  Lab 07/07/17 0804  07/08/17 2100 07/09/17 0750 07/10/17 0403 07/11/17 0613 07/12/17 0916 07/13/17 0734  NA 135   < > 136 137 137 137 136 135  K 3.4*   < > 3.4* 4.0 3.8 3.6 4.3 4.2  CL 100*   < > 95* 98* 97* 93* 98* 97*  CO2 21*   < > 25 21* 26 27 26 29   GLUCOSE 89   < > 147* 95 107* 110* 104* 85  BUN 26*   < > 23* 27* 13 27* 44* 23*  CREATININE 10.40*   < > 11.20* 12.24* 7.71* 10.30* 12.88* 8.04*  CALCIUM 9.2   < > 10.4* 10.5* 9.8 10.8* 10.2 9.7  MG  --   --  2.1  --   --   --   --   --   PHOS 4.5  --   --   --   --   --  7.9* 5.1*   < > = values in this interval not displayed.   Recent Labs  Lab 07/07/17 0804 07/08/17 2100 07/12/17 0916 07/13/17 0734  AST  --  21  --   --   ALT  --  <5*  --   --   ALKPHOS  --  49  --   --   BILITOT  --  0.9  --   --   PROT  --  7.0  --   --   ALBUMIN 3.3* 3.6 3.0* 2.9*   Recent Labs  Lab 07/08/17 2100 07/09/17 0447 07/10/17 0403 07/11/17 0613 07/12/17 0917  WBC 7.5 9.3 7.4 11.2* 10.1  NEUTROABS 5.7  --   --   --   --   HGB 13.1 13.0 12.7* 13.6 11.2*  HCT 38.9* 39.6 38.3* 40.4 33.8*  MCV 93.1 93.6 93.9 93.7 92.6  PLT 229 169 250 297 263   Recent Labs  Lab 07/08/17 2100 07/09/17 0750 07/12/17 1432 07/12/17 2139 07/13/17 0226  TROPONINI 0.62* 11.47* 1.88* 1.84* 1.66*    IMAGING No  results found.   Revised 2D Echocardiogram  - Procedure narrative: Limited study for LV function. - Left ventricle: The cavity size was normal. Wall thickness was increased in a pattern of mild LVH. Systolic function was mildly to moderately reduced. The estimated ejection fraction was in the range of 40% to 45%. Filling defect noted at the inferior apex with Definity contrast - likely represents mural thrombus. The study is not technically sufficient to allow evaluation of LV diastolic function. Impressions: Compared to a prior study on 07/06/2017, the LVEF is unchanged at 40-45% with apical wall motion abnormality and filing defect, likely representing mural thrombus.   PHYSICAL EXAM General - Well nourished, well developed, in no apparent distress. Cardiovascular - pulse and heart tones are reg today. Tele reviewed at bedside is NSR.  Neurological  Exam :- awake alert, global aphasia, does thumbs up to any "yes" question and squints with any "no" question. Laughs when I tell him he is not speaking on purpuse. Mimics some, but not all gestures. Did not follow any simple commands. Gaze conjugate this am, crosses midline and tracks me in room to both sides,attending to right side. Blinking to visual threat on the left, however not blinking to the right.  PERRL, right facial droop.  Tongue midline.  Right UE flaccid even on pain stimulation. RLE 1/5 on painful stimuli. LUE and LLE purposeful movement and against gravity.  Toes upgoing on the R. Sensation is decreased to pain stimuli on RUE, coordination not cooperative and gait not able to test.  ASSESSMENT/PLAN [name] is a 57 y.o. male with history of CAD/MI s/p stenting and CABG, known cardiomyopathy with EF 35-40% (now improved on current TTE), hypertension, hyperlipidemia, cognitive impairment, obesity, seizure, OSA, ESRD on hemodialysis admitted for AMS and right-sided weakness. No tPA given due to outside window. S/p IA thrombectomy  on 4/21  NSTEMI and Arrythmias, NSVT  Following severe HTN and elevated HR 4/24  On BB and high dose statin.  Hx of CAD/MI - s/p stenting and CABG in past  EKG showed PVCs, VT, then on 4/25 remained in sbrady with bigeminy PACs.   Troponin 0.62->11.47 (4/25, peak)->1.66  Cardiology recommends long term anticoagulation, currently on Heparin gtt   Add coumadin. 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   Resultant global aphasia, right hemiplegia, ongoing rehab efforts needed. Recovery will likely be incomplete despite best rehab efforts.   CT left MCA infarct  CTA head and neck left CCA 50% stenosis left M2 cutoff and diffuse intracranial stenosis. Out pt f/u with Vasc Sx recommended.   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 Yes; Fluid consistency: Thin   No role in triple tx w/Heparin, ASA + Plavix, thus dropped the Plavix on 4/26  DAPT was initially started, and this would be indicated indefinitely for this chronic intracranial stenosis, however if long term anticoagulation is indicated from cardio standpoint, then would favor ASA 81mg  only in addition to anticoag.  Therapy recommendations: SNF   Disposition: Pending  History of stroke  12/2013, admitted for hypotension after HD, MRI  showed left caudate head acute infarct, chronic bilateral BG and pontine infarcts.  MRA severe ICAD involving bilateral MCAs.  Carotid Doppler negative.  LDL 89 and A1c 5.6.  Discharged with dual antiplatelet and Crestor 40  Follows with [facility]. Terrace Arabia at [facility]  CHF with cardiomyopathy, NSTEMI  EF 35 to 40% in 02/2017  TTE EF 40-45% this time- No thrombus noted in report. There is akinesis  2/24 pm event with tachy and VTach and other arrythmias  Trop peaked 11.7, no intervention planned at this time  Cardiology consulted, following  On Heparin gtt per cardiology, ok to transition to coumadin per Cards   ESRD on HD  Nephrology on board  HD scheduled MWF PTA  Following CRT  Hypertension / hypotension  Off Cardene now  Low BP noted during HD; avoid hypotension. No further role in augmentation of bp  Now on metoprolol 12.5mg  bid  Hydralazine IV PRN if there is bradycardia  SBP goal given severe intracranial stenosis: 120-150s  Hyperlipidemia  Home meds: Lipitor 40, increased to max dose 80mg  qhs  LDL 131, goal < 70  Other Stroke Risk Factors  Obstructive sleep apnea, on CPAP at home  Other Active Problems  Cognitive impairment - following with Dr. Terrace Arabia at [facility], continue home meds. High risk for delirium   ?? Seizure, not on meds  Difficult IV access  Hypokalemia, resolved. Replaced.  Hospital day # 9  Annie Main, MSN, APRN, ANVP-BC, AGPCNP-BC Advanced Practice Stroke Nurse [facility] See Amion for Schedule & Pager information 07/13/2017 1:44 PM  I have personally examined this patient, reviewed notes, independently viewed imaging studies, participated in medical decision making and plan of care.ROS completed by me personally and pertinent positives fully documented  I have made any additions or clarifications directly to the above note. Agree with note above.Greater than 50% time during this 25 minute visit was spent on counseling and coordination of  care about his stroke and planning treatment  Delia Heady, MD Medical Director [facility] Pager: [phone] 07/13/2017 2:23 PM  To contact Stroke Continuity provider, please refer to [email or web address]. After hours, contact General Neurology

## 2017-07-13 NOTE — Progress Notes (Signed)
  Speech Language Pathology Treatment: Dysphagia;Cognitive-Linquistic  Patient Details Name: [name] MRN: [id number] DOB: 12-Oct-1960 Today's Date: 07/13/2017 Time: 3545-6256 SLP Time Calculation (min) (ACUTE ONLY): 15 min  Assessment / Plan / Recommendation Clinical Impression  Patient was upright in bed with lunch tray in front of him. He has already eaten his entree but had pudding and magic cup remaining. Pt was able to answer "Do you want your pudding?" with a thumbs up. Pudding was opened but he was able to feed himself with his left hand (right hand flaccid). He required help with last spoonful and looked at me when he needed help (as a request). Same process was repeated with the magic cup. There was no sign of aspiration. Pt did have anterior spillage x2, but was aware as he looked for a napkin. He tolerate water as well after intake with no s/s of aspiration. He made no attempts to verbalize, however answered y/n questions consistently with thumbs up/down. Pt is making steady progress. Continue to monitor for diet upgrade and provide aphasia therapy.   HPI HPI: 57 yo code stroke from home via [facility] to [facility] ED with new R weakness, aphasia and neglect. MRI shows MRI shows cortically based restricted diffusion in the posterior left insula, superior temporal lobe, parietal lobe, and minimally involving the posterior left frontal lobe. Small remote infarcts in the left superior cerebellum, left paramedian pons, left basal ganglia/internal capsule, and right temporal parietal cortex. There is chronic blood products at the remote left basal ganglia infarct. No hemorrhage seen at the acute infarct.      SLP Plan    Continue therapy for diet tolerance, diet increase, aphasia      Recommendations  Diet recommendations: Dysphagia 1 (puree);Thin liquid Liquids provided via: Straw Medication Administration: Crushed with puree Supervision: Staff to assist with self feeding;Full  supervision/cueing for compensatory strategies Compensations: Slow rate;Small sips/bites;Lingual sweep for clearance of pocketing;Minimize environmental distractions;Follow solids with liquid Postural Changes and/or Swallow Maneuvers: Seated upright 90 degrees;Upright 30-60 min after meal                        GO                Lindalou Hose Manroop Jakubowicz, MA, CCC-SLP 07/13/2017 1:07 PM

## 2017-07-13 NOTE — Progress Notes (Addendum)
Progress Note  Patient Name: [name] Date of Encounter: 07/13/2017  Primary Cardiologist: Nanetta Batty, MD   Subjective   No pain, communicates by thumbs up sign.  No SOB  Inpatient Medications    Scheduled Meds: .  stroke: mapping our early stages of recovery book   Does not apply Once  . aspirin  324 mg Oral Daily  . atorvastatin  80 mg Oral Daily  . cinacalcet  90 mg Oral Q supper  . febuxostat  40 mg Oral Daily  . feeding supplement (NEPRO CARB STEADY)  237 mL Oral BID BM  . mouth rinse  15 mL Mouth Rinse BID  . memantine  10 mg Oral BID  . metoprolol tartrate  12.5 mg Oral BID  . nitroGLYCERIN  1 inch Topical Q6H  . sevelamer carbonate  2.4 g Oral TID WC   Continuous Infusions: . sodium chloride    . sodium chloride    . sodium chloride    . sodium chloride    . heparin 1,050 Units/hr (07/13/17 0813)   PRN Meds: sodium chloride, sodium chloride, sodium chloride, sodium chloride, acetaminophen **OR** acetaminophen (TYLENOL) oral liquid 160 mg/5 mL **OR** acetaminophen, alteplase, heparin, heparin, hydrALAZINE, lidocaine (PF), lidocaine-prilocaine, ondansetron (ZOFRAN) IV, pentafluoroprop-tetrafluoroeth   Vital Signs    Vitals:   07/12/17 2027 07/12/17 2337 07/13/17 0418 07/13/17 0827  BP: (!) 147/102 131/85 (!) 155/86 (!) 125/92  Pulse: 92 71 75 93  Resp: 20 18 18 20   Temp: 98.7 F (37.1 C) 98.6 F (37 C) 97.8 F (36.6 C)   TempSrc: Oral Oral Oral   SpO2: 100% 92% 100%   Weight:      Height:        Intake/Output Summary (Last 24 hours) at 07/13/2017 1102 Last data filed at 07/13/2017 0603 Gross per 24 hour  Intake 410 ml  Output 638 ml  Net -228 ml   Filed Weights   07/07/17 1138 07/09/17 1513 07/09/17 1920  Weight: 193 lb 9 oz (87.8 kg) 198 lb 6.6 oz (90 kg) 196 lb 3.4 oz (89 kg)    Telemetry    SR with occ PVCs and non conducted PACs - Personally Reviewed  ECG    No new - Personally Reviewed  Physical Exam   GEN: No acute  distress.   Neck: No JVD Cardiac: RRR, no murmurs, rubs, or gallops.  Respiratory: Clear to auscultation bilaterally. GI: Soft, nontender, non-distended  MS: No edema; No deformity. Neuro:   seems to say ok by thumbs up sign Psych: Normal affect   Labs    Chemistry Recent Labs  Lab 07/08/17 2100  07/11/17 0613 07/12/17 0916 07/13/17 0734  NA 136   < > 137 136 135  K 3.4*   < > 3.6 4.3 4.2  CL 95*   < > 93* 98* 97*  CO2 25   < > 27 26 29   GLUCOSE 147*   < > 110* 104* 85  BUN 23*   < > 27* 44* 23*  CREATININE 11.20*   < > 10.30* 12.88* 8.04*  CALCIUM 10.4*   < > 10.8* 10.2 9.7  PROT 7.0  --   --   --   --   ALBUMIN 3.6  --   --  3.0* 2.9*  AST 21  --   --   --   --   ALT <5*  --   --   --   --   ALKPHOS 49  --   --   --   --  BILITOT 0.9  --   --   --   --   GFRNONAA 4*   < > 5* 4* 7*  GFRAA 5*   < > 6* 4* 8*  ANIONGAP 16*   < > 17* 12 9   < > = values in this interval not displayed.     Hematology Recent Labs  Lab 07/10/17 0403 07/11/17 0613 07/12/17 0917  WBC 7.4 11.2* 10.1  RBC 4.08* 4.31 3.65*  HGB 12.7* 13.6 11.2*  HCT 38.3* 40.4 33.8*  MCV 93.9 93.7 92.6  MCH 31.1 31.6 30.7  MCHC 33.2 33.7 33.1  RDW 14.9 14.7 14.4  PLT 250 297 263    Cardiac Enzymes Recent Labs  Lab 07/09/17 0750 07/12/17 1432 07/12/17 2139 07/13/17 0226  TROPONINI 11.47* 1.88* 1.84* 1.66*   No results for input(s): TROPIPOC in the last 168 hours.   BNPNo results for input(s): BNP, PROBNP in the last 168 hours.   DDimer No results for input(s): DDIMER in the last 168 hours.   Radiology    No results found.  Cardiac Studies   Echo 07/06/17  Study Conclusions  - Left ventricle: Wall thickness was increased in a pattern of mild LVH. Systolic function was mildly to moderately reduced. The estimated ejection fraction was in the range of 40% to 45%. Akinesis of the apicalanteroseptal, anterolateral, and apical myocardium. Doppler parameters are consistent with  abnormal left ventricular relaxation (grade 1 diastolic dysfunction). - Aortic valve: There was mild regurgitation.  LAST Cath: 03/05/17  Conclusion     Ost 1st Mrg to 1st Mrg lesion is 80% stenosed.  1st Mrg lesion is 95% stenosed.  Post Atrio lesion is 100% stenosed.  Prox LAD lesion is 100% stenosed.  Mid LAD lesion is 25% stenosed.  Dist LAD lesion is 95% stenosed.  There is moderate left ventricular systolic dysfunction.  LV end diastolic pressure is moderately elevated.  The left ventricular ejection fraction is 35-45% by visual estimate.    IMPRESSION:Mr. Hudnall has a patent graft to the PLA branch, pitting graft to 8 circumflex marginal branch and patent LIMA to the LAD with a patent stent beyond LIMA insertion. He has severe apical LAD disease as well. There is no vessel responsible for his non-STEMI although he does have an anteroapical wall motion and LV. His apical LAD is not revascularizable because of the diffuse disease nature of the disease in size of the vessel. I recommend continued medical therapy. The sheath was removed and pressure will be held on the groin to achieve hemostasis.     Patient Profile     57 y.o. male with history of CAD (s/p CABG in 2013), ESRD on HD, HTN, CVA, HL, GERD and anemia Admitted with CVA with R sided hemiplegia Underwent IA intergrilin and mechanical thrombolysis Failed mechanical thrombectomy   Assessment & Plan    NSTEMI with troponin to 11.47 now trended down to 1.66.  This occurred after severe HTN and elevated HR.  EF 40-45% --is on asa, BB and high dose statin --? Ace if Neuro and renal agree.   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   L MCA Stroke, failed thrombectomy with IR, rec'd Integrilin. Followed by Neuro. --on high dose statin  HTN improving 155/86 to 125/92   ESRD on HD per renal.  With HF managed by renal.     For questions or updates, please  contact [facility] Please consult [email or web address] for  contact info under Cardiology/STEMI.      Signed, Nada Boozer, NP  07/13/2017, 11:02 AM    History and all data above reviewed.  Patient examined.  I agree with the findings as above.  Unable to get any verbal history. The patient exam reveals COR:RRR  ,  Lungs: Clear  ,  Abd: Positive bowel sounds, no rebound no guarding, Ext No edema   .  All available labs, radiology testing, previous records reviewed. Agree with documented assessment and plan. NSTEMI:  Medical management.  Continue current therapy.  On  IV heparin.   Will need warfarin when OK with primary team.    Rollene Rotunda  12:18 PM  07/13/2017

## 2017-07-14 ENCOUNTER — Inpatient Hospital Stay (HOSPITAL_COMMUNITY): Payer: Medicare Other

## 2017-07-14 LAB — GLUCOSE, CAPILLARY
GLUCOSE-CAPILLARY: 91 mg/dL (ref 65–99)
Glucose-Capillary: 100 mg/dL — ABNORMAL HIGH (ref 65–99)

## 2017-07-14 LAB — PROTIME-INR
INR: 1.03
Prothrombin Time: 13.4 seconds (ref 11.4–15.2)

## 2017-07-14 LAB — HEPARIN LEVEL (UNFRACTIONATED)
HEPARIN UNFRACTIONATED: 0.16 [IU]/mL — AB (ref 0.30–0.70)
HEPARIN UNFRACTIONATED: 0.3 [IU]/mL (ref 0.30–0.70)

## 2017-07-14 MED ORDER — WARFARIN SODIUM 5 MG PO TABS
5.0000 mg | ORAL_TABLET | Freq: Once | ORAL | Status: AC
  Administered 2017-07-14: 5 mg via ORAL
  Filled 2017-07-14: qty 1

## 2017-07-14 MED ORDER — AMLODIPINE BESYLATE 2.5 MG PO TABS
2.5000 mg | ORAL_TABLET | Freq: Every day | ORAL | Status: DC
  Administered 2017-07-14 – 2017-07-17 (×4): 2.5 mg via ORAL
  Filled 2017-07-14 (×4): qty 1

## 2017-07-14 MED ORDER — METOPROLOL TARTRATE 25 MG PO TABS
25.0000 mg | ORAL_TABLET | Freq: Two times a day (BID) | ORAL | Status: DC
  Administered 2017-07-14: 25 mg via ORAL
  Administered 2017-07-14: 12.5 mg via ORAL
  Administered 2017-07-15 – 2017-07-16 (×4): 25 mg via ORAL
  Filled 2017-07-14 (×5): qty 1

## 2017-07-14 NOTE — Progress Notes (Signed)
ANTICOAGULATION CONSULT NOTE - Follow Up Consult  Pharmacy Consult for Heparin Indication: Mural thrombus, CVA  Allergies  Allergen Reactions  . Shrimp [Shellfish Allergy] Shortness Of Breath  . Atorvastatin Other (See Comments)    weakness  . Insulins Other (See Comments)    Patient unsure as to the kind of insulin but states that it has previously caused seizures.  Most recent hospitalization (Jan 2013) received insulin but did not have reaction. LIKELY DUE TO SIGNIFICANT HYPOGLYCEMIA  . Simvastatin Other (See Comments)     abnormal liver tests  . Ultram [Tramadol] Other (See Comments)    Liver enzyme     Patient Measurements: Height: 5\' 11"  (180.3 cm) Weight: 196 lb 3.4 oz (89 kg) IBW/kg (Calculated) : 75.3 Heparin Dosing Weight: 86.8 kg  Vital Signs: Temp: 98.5 F (36.9 C) (04/30 1624) Temp Source: Oral (04/30 1624) BP: 156/83 (04/30 1624) Pulse Rate: 38 (04/30 1624)  Labs: Recent Labs    07/12/17 0916 07/12/17 0917  07/12/17 1432 07/12/17 2139 07/13/17 0226 07/13/17 0734 07/13/17 1419  07/14/17 0614 07/14/17 1039 07/14/17 1817  HGB  --  11.2*  --   --   --   --   --   --   --   --   --   --   HCT  --  33.8*  --   --   --   --   --   --   --   --   --   --   PLT  --  263  --   --   --   --   --   --   --   --   --   --   LABPROT  --   --   --   --   --   --   --  13.1  --  13.4  --   --   INR  --   --   --   --   --   --   --  1.00  --  1.03  --   --   HEPARINUNFRC  --   --    < >  --   --  0.28*  --  0.39   < > <0.10* 0.16* 0.30  CREATININE 12.88*  --   --   --   --   --  8.04*  --   --   --   --   --   TROPONINI  --   --   --  1.88* 1.84* 1.66*  --   --   --   --   --   --    < > = values in this interval not displayed.    Estimated Creatinine Clearance: 10.8 mL/min (A) (by C-G formula based on SCr of 8.04 mg/dL (H)).   Assessment:  Anticoag: hep for ACS/new LV thrombus, low goal w/ acute CVA. H/H down, Plt wnl, no AC pta. Mural thrombus on ECHO.  Warfarin started 4/29. Heparin level 0.3 this PM at low end of goal range.  Goal of Therapy:  Heparin level 0.3-0.5 units/ml Monitor platelets by anticoagulation protocol: Yes   Plan:  Continue Heparin at 1350 units/hr Daily heparin level/CBC  [name] S. Merilynn Finland, PharmD, BCPS Clinical Staff Pharmacist Pager [phone]  Misty Stanley Stillinger 07/14/2017,7:19 PM

## 2017-07-14 NOTE — Progress Notes (Signed)
ANTICOAGULATION CONSULT NOTE - Follow Up Consult  Pharmacy Consult for heparin Indication: LV thrombus/CVA   Labs: Recent Labs    07/12/17 0916 07/12/17 0917  07/12/17 1432 07/12/17 2139 07/13/17 0226 07/13/17 0734 07/13/17 1419 07/13/17 2018 07/14/17 0614  HGB  --  11.2*  --   --   --   --   --   --   --   --   HCT  --  33.8*  --   --   --   --   --   --   --   --   PLT  --  263  --   --   --   --   --   --   --   --   LABPROT  --   --   --   --   --   --   --  13.1  --  13.4  INR  --   --   --   --   --   --   --  1.00  --  1.03  HEPARINUNFRC  --   --    < >  --   --  0.28*  --  0.39 0.25* <0.10*  CREATININE 12.88*  --   --   --   --   --  8.04*  --   --   --   TROPONINI  --   --   --  1.88* 1.84* 1.66*  --   --   --   --    < > = values in this interval not displayed.    Assessment/Plan:  Heparin level this am undetectable but RN had not increased rate until 0510 this am.  Will recheck heparin level prior to making a change.   Vernard Gambles, PharmD, BCPS  07/14/2017,7:22 AM

## 2017-07-14 NOTE — Progress Notes (Signed)
ANTICOAGULATION CONSULT NOTE  Pharmacy Consult for heparin Indication: LV thrombus/CVA   Heparin Dosing Weight: 86.8 kg  Labs: Recent Labs    07/12/17 0916 07/12/17 0917  07/12/17 1432 07/12/17 2139 07/13/17 0226 07/13/17 0734 07/13/17 1419 07/13/17 2018 07/14/17 0614 07/14/17 1039  HGB  --  11.2*  --   --   --   --   --   --   --   --   --   HCT  --  33.8*  --   --   --   --   --   --   --   --   --   PLT  --  263  --   --   --   --   --   --   --   --   --   LABPROT  --   --   --   --   --   --   --  13.1  --  13.4  --   INR  --   --   --   --   --   --   --  1.00  --  1.03  --   HEPARINUNFRC  --   --    < >  --   --  0.28*  --  0.39 0.25* <0.10* 0.16*  CREATININE 12.88*  --   --   --   --   --  8.04*  --   --   --   --   TROPONINI  --   --   --  1.88* 1.84* 1.66*  --   --   --   --   --    < > = values in this interval not displayed.    Assessment: 63 yom presenting with CVA (did not receive TPA, s/p mechanical thrombectomy) with subsequent tachyarrhythmias and elevation in troponin. Pharmacy dosing IV heparin for ACS. CCM spoke with Neuro and no contraindication to anticoagulation.  Heparin level is down to 0.16 on 1150 units/hr. Also on coumadin for LV thrombus, INR 1.03. No interruption with infusion per RN  Goal of Therapy:  Heparin level 0.3-0.5 units/ml, no boluses INR 2-3 Monitor platelets by anticoagulation protocol: Yes   Plan:  Increase heparin rate to 1350 units/hr Coumadin 5mg  tonight F/u heparin level at 1830 Daily heparin level/CBC and PT/INR Monitor s/sx bleeding  Emanuele Mcwhirter A. Jeanella Craze, PharmD, BCPS Clinical Pharmacist Branch Pager: [phone]

## 2017-07-14 NOTE — Progress Notes (Signed)
[facility] Progress Note  Subjective: no c/o  Vitals:   07/13/17 1613 07/13/17 1950 07/14/17 0428 07/14/17 1051  BP: (!) 144/92 (!) 136/92 (!) 157/89 (!) 150/104  Pulse: 73 76 83 89  Resp: 20 18 18    Temp:  98.2 F (36.8 C) 98.5 F (36.9 C)   TempSrc:  Oral Oral   SpO2:  100% 99%   Weight:      Height:        Inpatient medications: .  stroke: mapping our early stages of recovery book   Does not apply Once  . amLODipine  2.5 mg Oral Daily  . aspirin  324 mg Oral Daily  . atorvastatin  80 mg Oral Daily  . cinacalcet  90 mg Oral Q supper  . febuxostat  40 mg Oral Daily  . feeding supplement (NEPRO CARB STEADY)  237 mL Oral BID BM  . mouth rinse  15 mL Mouth Rinse BID  . memantine  10 mg Oral BID  . metoprolol tartrate  25 mg Oral BID  . sevelamer carbonate  2.4 g Oral TID WC  . Warfarin - Pharmacist Dosing Inpatient   Does not apply q1800   . sodium chloride    . sodium chloride    . sodium chloride    . sodium chloride    . heparin 1,050 Units/hr (07/13/17 0813)   sodium chloride, sodium chloride, sodium chloride, sodium chloride, acetaminophen **OR** acetaminophen (TYLENOL) oral liquid 160 mg/5 mL **OR** acetaminophen, alteplase, heparin, heparin, hydrALAZINE, iohexol, lidocaine (PF), lidocaine-prilocaine, pentafluoroprop-tetrafluoroeth  Exam: Alert, aphasic, responds to commands No jvd Chest clear bilat Cor reg no rg Abd soft ntnd no mass or ascites Ext no edema Ox 3, right hemiparesis dense Left upper avf w bruit   Dialysis: [facility] 4.5h  400/800  89kg  2/2.5 bath  Left upper avf  Hep 4000 then 2000 midrun Venofer 50mg  IV qwk Hectorol16mcg IV qHD  Home meds: ASA/atorvastatin/clopidogrel/nitro Clonidine 0.2 BID/metoprolol tartrate 25mg  BID/terazosin [name] 1000mg  2AC TID, and 1BID snacks Uloric 40mg  qd      Impression: 1 acute cva w/ right hemiparesis and aphasia 2 esrd on hd [facility] 3 anemia ckd Hb up 13, no esa 4 mbd ckd calc  levels high, holding vdra 5 htn/ vol at dry wt bp meds per cards 6 nstemi per cardiology 7 dispo for snfp   Plan - dialysis tomorrow   Vinson Moselle MD [facility] pager [phone]   07/14/2017, 11:34 AM   Recent Labs  Lab 07/11/17 0613 07/12/17 0916 07/13/17 0734  NA 137 136 135  K 3.6 4.3 4.2  CL 93* 98* 97*  CO2 27 26 29   GLUCOSE 110* 104* 85  BUN 27* 44* 23*  CREATININE 10.30* 12.88* 8.04*  CALCIUM 10.8* 10.2 9.7  PHOS  --  7.9* 5.1*   Recent Labs  Lab 07/08/17 2100 07/12/17 0916 07/13/17 0734  AST 21  --   --   ALT <5*  --   --   ALKPHOS 49  --   --   BILITOT 0.9  --   --   PROT 7.0  --   --   ALBUMIN 3.6 3.0* 2.9*   Recent Labs  Lab 07/08/17 2100  07/10/17 0403 07/11/17 0613 07/12/17 0917  WBC 7.5   < > 7.4 11.2* 10.1  NEUTROABS 5.7  --   --   --   --   HGB 13.1   < > 12.7* 13.6 11.2*  HCT  38.9*   < > 38.3* 40.4 33.8*  MCV 93.1   < > 93.9 93.7 92.6  PLT 229   < > 250 297 263   < > = values in this interval not displayed.   Iron/TIBC/Ferritin/ %Sat    Component Value Date/Time   IRON 35 (L) 03/05/2017 0106   TIBC 182 (L) 03/05/2017 0106   FERRITIN 844 (H) 03/05/2017 0106   IRONPCTSAT 19 03/05/2017 0106

## 2017-07-14 NOTE — Progress Notes (Signed)
Progress Note  Patient Name: [name] Date of Encounter: 07/14/2017  Primary Cardiologist:   Nanetta Batty, MD   Subjective   Awake and alert and seems to deny chest pain or SOB.   Inpatient Medications    Scheduled Meds: .  stroke: mapping our early stages of recovery book   Does not apply Once  . aspirin  324 mg Oral Daily  . atorvastatin  80 mg Oral Daily  . cinacalcet  90 mg Oral Q supper  . febuxostat  40 mg Oral Daily  . feeding supplement (NEPRO CARB STEADY)  237 mL Oral BID BM  . mouth rinse  15 mL Mouth Rinse BID  . memantine  10 mg Oral BID  . metoprolol tartrate  12.5 mg Oral BID  . nitroGLYCERIN  1 inch Topical Q6H  . sevelamer carbonate  2.4 g Oral TID WC  . Warfarin - Pharmacist Dosing Inpatient   Does not apply q1800   Continuous Infusions: . sodium chloride    . sodium chloride    . sodium chloride    . sodium chloride    . heparin 1,050 Units/hr (07/13/17 0813)   PRN Meds: sodium chloride, sodium chloride, sodium chloride, sodium chloride, acetaminophen **OR** acetaminophen (TYLENOL) oral liquid 160 mg/5 mL **OR** acetaminophen, alteplase, heparin, heparin, hydrALAZINE, iohexol, lidocaine (PF), lidocaine-prilocaine, pentafluoroprop-tetrafluoroeth   Vital Signs    Vitals:   07/13/17 0827 07/13/17 1613 07/13/17 1950 07/14/17 0428  BP: (!) 125/92 (!) 144/92 (!) 136/92 (!) 157/89  Pulse: 93 73 76 83  Resp: 20 20 18 18   Temp:   98.2 F (36.8 C) 98.5 F (36.9 C)  TempSrc:   Oral Oral  SpO2:   100% 99%  Weight:      Height:        Intake/Output Summary (Last 24 hours) at 07/14/2017 0832 Last data filed at 07/13/2017 1300 Gross per 24 hour  Intake 120 ml  Output -  Net 120 ml   Filed Weights   07/07/17 1138 07/09/17 1513 07/09/17 1920  Weight: 193 lb 9 oz (87.8 kg) 198 lb 6.6 oz (90 kg) 196 lb 3.4 oz (89 kg)    Telemetry    NSR, rare atrial and ventricular ectopy.  No NSVT - Personally Reviewed  ECG    NA - Personally  Reviewed  Physical Exam   GEN: No acute distress.   Neck: No  JVD Cardiac: RRR, no murmurs, rubs, or gallops.  Respiratory: Clear  to auscultation bilaterally. GI: Soft, nontender, non-distended  MS: No  edema; No deformity. Neuro:  Hemiparesis and aphasic Psych:   Unable to assess.    Labs    Chemistry Recent Labs  Lab 07/08/17 2100  07/11/17 0613 07/12/17 0916 07/13/17 0734  NA 136   < > 137 136 135  K 3.4*   < > 3.6 4.3 4.2  CL 95*   < > 93* 98* 97*  CO2 25   < > 27 26 29   GLUCOSE 147*   < > 110* 104* 85  BUN 23*   < > 27* 44* 23*  CREATININE 11.20*   < > 10.30* 12.88* 8.04*  CALCIUM 10.4*   < > 10.8* 10.2 9.7  PROT 7.0  --   --   --   --   ALBUMIN 3.6  --   --  3.0* 2.9*  AST 21  --   --   --   --   ALT <5*  --   --   --   --  ALKPHOS 49  --   --   --   --   BILITOT 0.9  --   --   --   --   GFRNONAA 4*   < > 5* 4* 7*  GFRAA 5*   < > 6* 4* 8*  ANIONGAP 16*   < > 17* 12 9   < > = values in this interval not displayed.     Hematology Recent Labs  Lab 07/10/17 0403 07/11/17 0613 07/12/17 0917  WBC 7.4 11.2* 10.1  RBC 4.08* 4.31 3.65*  HGB 12.7* 13.6 11.2*  HCT 38.3* 40.4 33.8*  MCV 93.9 93.7 92.6  MCH 31.1 31.6 30.7  MCHC 33.2 33.7 33.1  RDW 14.9 14.7 14.4  PLT 250 297 263    Cardiac Enzymes Recent Labs  Lab 07/09/17 0750 07/12/17 1432 07/12/17 2139 07/13/17 0226  TROPONINI 11.47* 1.88* 1.84* 1.66*   No results for input(s): TROPIPOC in the last 168 hours.   BNPNo results for input(s): BNP, PROBNP in the last 168 hours.   DDimer No results for input(s): DDIMER in the last 168 hours.   Radiology    Ct Head Wo Contrast  Result Date: 07/14/2017 CLINICAL DATA:  Follow-up examination for stroke. EXAM: CT HEAD WITHOUT CONTRAST TECHNIQUE: Contiguous axial images were obtained from the base of the skull through the vertex without intravenous contrast. COMPARISON:  Prior CT from 07/08/2017. FINDINGS: Brain: There has been continued normal interval  evolution of previously identified posterior left MCA territory infarct. Previously seen associated hypodensity is less apparent, with a few scattered areas of laminar necrosis now seen within the area of infarction. No evidence for hemorrhagic transformation or other complication. Underlying atrophy with chronic small vessel ischemic disease again noted, stable. No new large vessel territory infarct. No acute intracranial hemorrhage. No mass lesion, midline shift or mass effect. No hydrocephalus. No extra-axial fluid collection. Vascular: No hyperdense vessel. Calcified atherosclerosis at the skull base. Skull: Scalp soft tissues and calvarium demonstrate no acute abnormality. Sinuses/Orbits: Globes and orbital soft tissues within normal limits. Paranasal sinuses are clear. No mastoid effusion. Other: None. IMPRESSION: 1. Continued normal expected interval evolution of left posterior MCA territory infarct with interval development of small volume laminar necrosis. No evidence for hemorrhagic transformation or other complication. 2. No other new acute intracranial abnormality. 3. Stable background cerebral atrophy with chronic small vessel ischemic changes. Electronically Signed   By: Rise Mu M.D.   On: 07/14/2017 02:38    Cardiac Studies   ECHO  07/09/17  Study Conclusions  - Procedure narrative: Limited study for LV function. - Left ventricle: The cavity size was normal. Wall thickness was   increased in a pattern of mild LVH. Systolic function was mildly   to moderately reduced. The estimated ejection fraction was in the   range of 40% to 45%. Filling defect noted at the inferior apex   with Definity contrast - likely represents mural thrombus. The   study is not technically sufficient to allow evaluation of LV   diastolic function.  Impressions:  - Compared to a prior study on 07/06/2017, the LVEF is unchanged at   40-45% with apical wall motion abnormality and filing defect,    likely representing mural thrombus.  Patient Profile     57 y.o. male with history of CAD (s/p CABG in 2013), ESRD on HD, HTN, CVA, HL, GERD and anemia Admitted with CVA with R sided hemiplegia.  Underwent IA intergrilin and mechanical thrombolysis.  Failed mechanical  thrombectomy.  Labs consistent with NSTEMI  Assessment & Plan    NSTEMI:   On ASA, Lipitor and beta blocker  CARDIOMYOPATHY:  EF is mildly reduced.  Euvolemic.     LV THROMBUS:  On heparin.  Warfarin started.  Stop heparin when INR is greater than 2.    NSVT:  No episodes noted.   HTN:   I am going to stop the NTG paste and increase beta blocker (which was decreased a few days ago) and restart a low dose of Norvasc  LEFT MCA STROKE:  Per neurology and primary team.   ESRD:  HD  MWF.    For questions or updates, please contact [facility] Please consult [email or web address] for contact info under Cardiology/STEMI.   Signed, Rollene Rotunda, MD  07/14/2017, 8:32 AM

## 2017-07-14 NOTE — Progress Notes (Addendum)
STROKE TEAM PROGRESS NOTE   SUBJECTIVE (INTERVAL HISTORY) Patient speaking today, though limited.  IV heparin bridge to Coumadin continues.  OBJECTIVE Temp:  [98.2 F (36.8 C)-98.5 F (36.9 C)] 98.4 F (36.9 C) (04/30 1205) Pulse Rate:  [73-89] 88 (04/30 1205) Cardiac Rhythm: Normal sinus rhythm (04/30 0700) Resp:  [18-20] 18 (04/30 1205) BP: (136-157)/(89-104) 146/98 (04/30 1205) SpO2:  [99 %-100 %] 99 % (04/30 0428)   IMAGING Ct Head Wo Contrast  Result Date: 07/14/2017 CLINICAL DATA:  Follow-up examination for stroke. EXAM: CT HEAD WITHOUT CONTRAST TECHNIQUE: Contiguous axial images were obtained from the base of the skull through the vertex without intravenous contrast. COMPARISON:  Prior CT from 07/08/2017. FINDINGS: Brain: There has been continued normal interval evolution of previously identified posterior left MCA territory infarct. Previously seen associated hypodensity is less apparent, with a few scattered areas of laminar necrosis now seen within the area of infarction. No evidence for hemorrhagic transformation or other complication. Underlying atrophy with chronic small vessel ischemic disease again noted, stable. No new large vessel territory infarct. No acute intracranial hemorrhage. No mass lesion, midline shift or mass effect. No hydrocephalus. No extra-axial fluid collection. Vascular: No hyperdense vessel. Calcified atherosclerosis at the skull base. Skull: Scalp soft tissues and calvarium demonstrate no acute abnormality. Sinuses/Orbits: Globes and orbital soft tissues within normal limits. Paranasal sinuses are clear. No mastoid effusion. Other: None. IMPRESSION: 1. Continued normal expected interval evolution of left posterior MCA territory infarct with interval development of small volume laminar necrosis. No evidence for hemorrhagic transformation or other complication. 2. No other new acute intracranial abnormality. 3. Stable background cerebral atrophy with chronic small  vessel ischemic changes. Electronically Signed   By: Rise Mu M.D.   On: 07/14/2017 02:38    PHYSICAL EXAM General - Well nourished, well developed, in no apparent distress. Cardiovascular - pulse and heart tones are reg today.  Neurological Exam :- awake alert, inconsistently answers short simple questions.  Does not follow commands. Mimics some, but not all gestures. Gaze conjugate this am, crosses midline and tracks me in room to both sides, attending to right side. Blinking to visual threat on the left, however not all blinking to the right.  PERRL, right facial droop.  Tongue midline.  Right UE flaccid even on pain stimulation. RLE 1/5 on painful stimuli. LUE and LLE purposeful movement and against gravity.  Left upper forearm enlarged, soft. Bandages CDI. toes upgoing on the R. Sensation is decreased to pain stimuli on RUE, coordination not cooperative and gait not able to test.  ASSESSMENT/PLAN [name] is a 57 y.o. male with history of CAD/MI s/p stenting and CABG, known cardiomyopathy with EF 35-40% (now improved on current TTE), hypertension, hyperlipidemia, cognitive impairment, obesity, seizure, OSA, ESRD on hemodialysis admitted for AMS and right-sided weakness. No tPA given due to outside window. S/p IA thrombectomy on 4/21  NSTEMI and Arrythmias, NSVT  Following severe HTN and elevated HR 4/24  On BB and high dose statin.  Hx of CAD/MI - s/p stenting and CABG in past  EKG showed PVCs, VT, then on 4/25 remained in sbrady with bigeminy PACs.   Troponin 0.62->11.47 (4/25, peak)->1.66  Cardiology recommends long term anticoagulation, currently on Heparin gtt bridge to coumadin. INR 1.03  Add coumadin. 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   Resultant global aphasia, right hemiplegia, ongoing rehab efforts needed. Recovery will likely be incomplete despite best rehab efforts.   CT left MCA infarct  CTA head and neck left CCA 50% stenosis left M2 cutoff and diffuse intracranial stenosis. Out pt f/u with Vasc Sx recommended.   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  No hemorrhage.  No other acute abnormality.    2D Echo EF 40-45%, LV mural thrombus (revised reading per cardiology) long term anticoagulation is recommended  LDL 131  HgbA1c 4.6  D1 thin Liq  No role in triple tx w/Heparin, ASA + Plavix, thus dropped the Plavix on 4/26  DAPT was initially started, and this would be indicated indefinitely for this chronic intracranial stenosis, however if long term anticoagulation is indicated from cardio standpoint, then would favor ASA 81mg  only in addition to anticoag.  Therapy recommendations: SNF   Disposition: Pending  History of stroke  12/2013, admitted for hypotension after HD, MRI showed left caudate head acute infarct, chronic bilateral BG and pontine infarcts.  MRA severe ICAD involving bilateral MCAs.  Carotid Doppler negative.  LDL 89 and A1c 5.6.  Discharged with dual antiplatelet and Crestor 40  Canceled TEE on [place].  He is medically ready when you find a place.  Canceled TEE canceled TEE on [place].  Follows with Dr. Terrace Arabia at [facility]  CHF with cardiomyopathy, NSTEMI  EF 35 to 40% in 02/2017  TTE EF 40-45% this time- No thrombus noted in report. There is akinesis  2/24 pm event with tachy and VTach and other arrythmias  Trop peaked 11.7, no intervention  planned at this time  Cardiology consulted, following  On Heparin gtt with transition to coumadin. Consider lovenox    ESRD on HD  Nephrology on board  HD scheduled MWF PTA  Hypertension / hypotension  Off Cardene now  avoid hypotension during HD  Now on metoprolol 12.5mg  bid  SBP goal given severe intracranial stenosis: 120-150s  Hyperlipidemia  Home meds: Lipitor 40, increased to max dose 80mg  qhs  LDL 131, goal < 70  Other Stroke Risk Factors  Obstructive sleep apnea, on CPAP at home  Other Active Problems  Cognitive impairment - following with Dr. Terrace Arabia at [facility], continue home meds. High risk for delirium   ?? Seizure, not on meds  Difficult IV access  Hypokalemia, resolved. Replaced.  Hospital day # 9  Annie Main, MSN, APRN, ANVP-BC, AGPCNP-BC Advanced Practice Stroke Nurse [facility] See Amion for Schedule & Pager information 07/14/2017 1:48 PM   I have personally examined this patient, reviewed notes, independently viewed imaging studies, participated in medical decision making and plan of care.ROS completed by me personally and pertinent positives fully documented  I have made any additions or clarifications directly to the above note. Agree with note above.The current medical regimen is effective;  continue present plan and medications. IV heparin bridge for warfarin 6 Lovenox cannot be given due to renal failure. Transfer to skilled nursing facility when INR is therapeutic.  Delia Heady, MD Medical Director [facility] Pager: [phone] 07/14/2017 3:12 PM To contact Stroke Continuity provider, please refer  to [email or web address] After hours, contact General Neurology

## 2017-07-14 NOTE — Progress Notes (Signed)
Nutrition Follow-up  DOCUMENTATION CODES:   Not applicable  INTERVENTION:  Continue Nepro Shake po BID, each supplement provides 425 kcal and 19 grams protein  Recommend Rena-Vit  Continue NDD1/Thin liquids per Speech Pathology  NUTRITION DIAGNOSIS:   Inadequate oral intake related to dysphagia as evidenced by meal completion < 50%. -continues  GOAL:   Patient will meet greater than or equal to 90% of their needs -progressing  MONITOR:   PO intake, Supplement acceptance  REASON FOR ASSESSMENT:   Malnutrition Screening Tool    ASSESSMENT:   Pt with PMH of ESRD on HD MWF, CHF, HTN, HLD, COPD, and CAD s/p CABG who was admitted with L MCA infarct per MD due to progressive large vessel atherosclerosis s/p IR revascularization 4/22.   Continues on HD MWF with R hemiplegia, able to respond to some questions appropriately with short answers but could not provide nutrition history today. Labs consistent with NSTEMI per cardiology. Ate 75% today, was fed by RN, consuming NePro. Average meal completion 75% per documentation. At dry weight per nephrology Will continue to re-assess for full nutrition history as appropriate.  Labs reviewed  Medications reviewed and include:  Sensipar, Renvela, Warfarin Heparin gtt  Diet Order:   Diet Order           DIET - DYS 1 Room service appropriate? Yes; Fluid consistency: Thin  Diet effective now          EDUCATION NEEDS:   No education needs have been identified at this time  Skin:  Skin Assessment: Reviewed RN Assessment  Last BM:  07/13/2017  Height:   Ht Readings from Last 1 Encounters:  07/06/17 5\' 11"  (1.803 m)    Weight:   Wt Readings from Last 1 Encounters:  07/09/17 196 lb 3.4 oz (89 kg)    Ideal Body Weight:  78.1 kg  BMI:  Body mass index is 27.37 kg/m.  Estimated Nutritional Needs:   Kcal:  2200-2500  Protein:  106-125 grams  Fluid:  1.2 L/day  Dionne Ano. Kirby Cortese, MS, RD LDN Inpatient Clinical  Dietitian Pager [phone]

## 2017-07-15 ENCOUNTER — Other Ambulatory Visit: Payer: Self-pay

## 2017-07-15 DIAGNOSIS — Z992 Dependence on renal dialysis: Secondary | ICD-10-CM | POA: Diagnosis not present

## 2017-07-15 DIAGNOSIS — N186 End stage renal disease: Secondary | ICD-10-CM | POA: Diagnosis not present

## 2017-07-15 DIAGNOSIS — I120 Hypertensive chronic kidney disease with stage 5 chronic kidney disease or end stage renal disease: Secondary | ICD-10-CM | POA: Diagnosis not present

## 2017-07-15 LAB — CBC
HCT: 35.5 % — ABNORMAL LOW (ref 39.0–52.0)
HEMATOCRIT: 30.8 % — AB (ref 39.0–52.0)
HEMOGLOBIN: 10.3 g/dL — AB (ref 13.0–17.0)
Hemoglobin: 11.8 g/dL — ABNORMAL LOW (ref 13.0–17.0)
MCH: 30.7 pg (ref 26.0–34.0)
MCH: 31.1 pg (ref 26.0–34.0)
MCHC: 33.4 g/dL (ref 30.0–36.0)
MCHC: 33.2 g/dL (ref 30.0–36.0)
MCV: 91.9 fL (ref 78.0–100.0)
MCV: 93.4 fL (ref 78.0–100.0)
Platelets: 260 10*3/uL (ref 150–400)
Platelets: 252 10*3/uL (ref 150–400)
RBC: 3.35 MIL/uL — AB (ref 4.22–5.81)
RBC: 3.8 MIL/uL — ABNORMAL LOW (ref 4.22–5.81)
RDW: 13.9 % (ref 11.5–15.5)
RDW: 14 % (ref 11.5–15.5)
WBC: 12.4 10*3/uL — AB (ref 4.0–10.5)
WBC: 12.8 10*3/uL — ABNORMAL HIGH (ref 4.0–10.5)

## 2017-07-15 LAB — HEPARIN LEVEL (UNFRACTIONATED): HEPARIN UNFRACTIONATED: 0.34 [IU]/mL (ref 0.30–0.70)

## 2017-07-15 LAB — RENAL FUNCTION PANEL
ALBUMIN: 3.1 g/dL — AB (ref 3.5–5.0)
ANION GAP: 14 (ref 5–15)
BUN: 52 mg/dL — ABNORMAL HIGH (ref 6–20)
CALCIUM: 9.4 mg/dL (ref 8.9–10.3)
CO2: 27 mmol/L (ref 22–32)
Chloride: 93 mmol/L — ABNORMAL LOW (ref 101–111)
Creatinine, Ser: 12.89 mg/dL — ABNORMAL HIGH (ref 0.61–1.24)
GFR, EST AFRICAN AMERICAN: 4 mL/min — AB (ref >60)
GFR, EST NON AFRICAN AMERICAN: 4 mL/min — AB (ref >60)
GLUCOSE: 94 mg/dL (ref 65–99)
PHOSPHORUS: 7 mg/dL — AB (ref 2.5–4.6)
Potassium: 4.7 mmol/L (ref 3.5–5.1)
SODIUM: 134 mmol/L — AB (ref 135–145)

## 2017-07-15 LAB — PROTIME-INR
INR: 1.24
Prothrombin Time: 15.5 seconds — ABNORMAL HIGH (ref 11.4–15.2)

## 2017-07-15 MED ORDER — SODIUM CHLORIDE 0.9 % IV SOLN: 100.0000 mL | INTRAVENOUS | Status: DC | PRN

## 2017-07-15 MED ORDER — LIDOCAINE-PRILOCAINE 2.5-2.5 % EX CREA: 1.0000 "application " | TOPICAL_CREAM | CUTANEOUS | Status: DC | PRN

## 2017-07-15 MED ORDER — ALTEPLASE 2 MG IJ SOLR
2.0000 mg | Freq: Once | INTRAMUSCULAR | Status: DC | PRN
  Filled 2017-07-15: qty 2

## 2017-07-15 MED ORDER — PENTAFLUOROPROP-TETRAFLUOROETH EX AERO: 1.0000 "application " | INHALATION_SPRAY | CUTANEOUS | Status: DC | PRN

## 2017-07-15 MED ORDER — HEPARIN SODIUM (PORCINE) 1000 UNIT/ML DIALYSIS: 1500.0000 [IU] | INTRAMUSCULAR | Status: DC | PRN

## 2017-07-15 MED ORDER — HEPARIN SODIUM (PORCINE) 1000 UNIT/ML DIALYSIS
1000.0000 [IU] | INTRAMUSCULAR | Status: DC | PRN
  Filled 2017-07-15: qty 1

## 2017-07-15 MED ORDER — LIDOCAINE-PRILOCAINE 2.5-2.5 % EX CREA
1.0000 "application " | TOPICAL_CREAM | CUTANEOUS | Status: DC | PRN
  Filled 2017-07-15: qty 5

## 2017-07-15 MED ORDER — LIDOCAINE HCL (PF) 1 % IJ SOLN: 5.0000 mL | INTRAMUSCULAR | Status: DC | PRN

## 2017-07-15 MED ORDER — WARFARIN SODIUM 7.5 MG PO TABS
7.5000 mg | ORAL_TABLET | Freq: Once | ORAL | Status: AC
  Administered 2017-07-15: 7.5 mg via ORAL
  Filled 2017-07-15 (×2): qty 1

## 2017-07-15 MED ORDER — HEPARIN SODIUM (PORCINE) 1000 UNIT/ML DIALYSIS
20.0000 [IU]/kg | INTRAMUSCULAR | Status: DC | PRN
  Filled 2017-07-15: qty 2

## 2017-07-15 NOTE — Progress Notes (Signed)
Physical Therapy Cancellation Note   07/15/17 1054  PT Visit Information  Last PT Received On 07/15/17  Reason Eval/Treat Not Completed Patient at procedure or test/unavailable. PT will check on pt later as time allows.    Erline Levine, PTA Pager: [phone]

## 2017-07-15 NOTE — Progress Notes (Signed)
Physical Therapy Treatment Patient Details Name: [name] MRN: [id number] DOB: Aug 14, 1960 Today's Date: 07/15/2017    History of Present Illness 57 y.o. male with a history of ESRD, previous stroke. Presenting with new right weakness, aphasia, and neglect. MRI showing acute left MCA territory cortical infarct     PT Comments    Patient agreeable to participate however limited by lethargy. Pt required max A +2 for all mobility this session and responded mostly with thumbs up with minimal verbal communication so cognition difficult to assess. Pt able to stand briefly X2 with max A +2. Continue to progress as tolerated with anticipated d/c to SNF for further skilled PT services.     Follow Up Recommendations  SNF(pt will have no one that can stay with him after d/c)     Equipment Recommendations  Other (comment)(TBA)    Recommendations for Other Services Rehab consult     Precautions / Restrictions Precautions Precautions: Fall Restrictions Weight Bearing Restrictions: No    Mobility  Bed Mobility Overal bed mobility: Needs Assistance Bed Mobility: Supine to Sit;Sit to Supine     Supine to sit: +2 for safety/equipment;Max assist;HOB elevated Sit to supine: +2 for physical assistance;Max assist   General bed mobility comments: assist to bring R LE and hips to EOB and to elevate trunk into sitting; multimodal cues for sequencing and technique  Transfers Overall transfer level: Needs assistance Equipment used: 2 person hand held assist Transfers: Sit to/from Stand Sit to Stand: +2 physical assistance;Max assist         General transfer comment: R knee blocked and R UE supported by therapist; cues for L hand placement and technique; pt able to stand briefly X2 with max A +2   Ambulation/Gait             General Gait Details: NA this session due to pt's lethargy   Stairs             Wheelchair Mobility    Modified Rankin (Stroke Patients  Only) Modified Rankin (Stroke Patients Only) Pre-Morbid Rankin Score: No symptoms Modified Rankin: Severe disability     Balance Overall balance assessment: Needs assistance Sitting-balance support: No upper extremity supported;Feet supported;Single extremity supported Sitting balance-Leahy Scale: Fair Sitting balance - Comments: assistance required initially then able to maintain sitting balance EOB with min guard assist if not challenged Postural control: Right lateral lean Standing balance support: During functional activity;Single extremity supported Standing balance-Leahy Scale: Zero                              Cognition Arousal/Alertness: Lethargic Behavior During Therapy: Flat affect Overall Cognitive Status: Impaired/Different from baseline Area of Impairment: Attention;Following commands;Safety/judgement;Awareness;Problem solving                   Current Attention Level: Focused   Following Commands: Follows one step commands inconsistently;Follows one step commands with increased time Safety/Judgement: Decreased awareness of safety;Decreased awareness of deficits Awareness: Intellectual Problem Solving: Slow processing;Decreased initiation;Difficulty sequencing;Requires verbal cues;Requires tactile cues General Comments: pt with limited verbal responses and mostly giving thumbs up for open ended questions however at times pt continues to not respond at all; very drowsy this session      Exercises General Exercises - Upper Extremity Shoulder Flexion: PROM;Right;10 reps;Supine(approx 0-40*) Elbow Flexion: PROM;Right;Supine;5 reps Elbow Extension: PROM;Right;5 reps;Supine Wrist Flexion: PROM;10 reps;Right;Supine Wrist Extension: PROM;10 reps;Right;Supine Digit Composite Flexion: PROM;10 reps;Right Composite Extension: PROM;Right;10 reps  Hand Exercises Forearm Supination: PROM;10 reps;Right Forearm Pronation: PROM;10 reps;Right Other  Exercises Other Exercises: gentle stretching applied to RUE as pt initially with increased tone     General Comments        Pertinent Vitals/Pain Pain Assessment: No/denies pain Faces Pain Scale: Hurts little more Pain Location: with some RUE movements  Pain Intervention(s): Monitored during session    Home Living                      Prior Function            PT Goals (current goals can now be found in the care plan section) Acute Rehab PT Goals Patient Stated Goal: none stated PT Goal Formulation: Patient unable to participate in goal setting Time For Goal Achievement: 07/20/17 Potential to Achieve Goals: Fair Progress towards PT goals: Progressing toward goals    Frequency    Min 3X/week      PT Plan Current plan remains appropriate    Co-evaluation              AM-PAC PT "6 Clicks" Daily Activity  Outcome Measure  Difficulty turning over in bed (including adjusting bedclothes, sheets and blankets)?: Unable Difficulty moving from lying on back to sitting on the side of the bed? : Unable Difficulty sitting down on and standing up from a chair with arms (e.g., wheelchair, bedside commode, etc,.)?: Unable Help needed moving to and from a bed to chair (including a wheelchair)?: A Lot Help needed walking in hospital room?: Total Help needed climbing 3-5 steps with a railing? : Total 6 Click Score: 7    End of Session Equipment Utilized During Treatment: Gait belt Activity Tolerance: Patient limited by lethargy Patient left: with call bell/phone within reach;in bed;with bed alarm set Nurse Communication: Mobility status PT Visit Diagnosis: Other symptoms and signs involving the nervous system (R29.898);Other abnormalities of gait and mobility (R26.89);Unsteadiness on feet (R26.81)     Time: 1610-9604 PT Time Calculation (min) (ACUTE ONLY): 43 min  Charges:  $Therapeutic Activity: 38-52 mins                    G Codes:       Erline Levine,  PTA Pager: [phone]     Carolynne Edouard 07/15/2017, 4:05 PM

## 2017-07-15 NOTE — Care Management Note (Signed)
Case Management Note  Patient Details  Name: ZALYN AMEND MRN: [id number] Date of Birth: Oct 21, 1960  Subjective/Objective:                    Action/Plan: Plan is for SNF when patient is medically ready. CM following.   Expected Discharge Date:                  Expected Discharge Plan:  Skilled Nursing Facility  In-House Referral:  Clinical Social Work  Discharge planning Services  CM Consult  Post Acute Care Choice:    Choice offered to:     DME Arranged:    DME Agency:     HH Arranged:    HH Agency:     Status of Service:  In process, will continue to follow  If discussed at Long Length of Stay Meetings, dates discussed:    Additional Comments:  Kermit Balo, RN 07/15/2017, 1:13 PM

## 2017-07-15 NOTE — Procedures (Signed)
Doing well on HD, watching game shows.  No c/o.  Not sure dispo yet.   I was present at this dialysis session, have reviewed the session itself and made  appropriate changes Vinson Moselle MD [facility] pager [phone]   07/15/2017, 12:05 PM

## 2017-07-15 NOTE — Clinical Social Work Note (Signed)
Clinical Social Work Assessment  Patient Details  Name: [name] MRN: [id number] Date of Birth: [birth year]  Date of referral:  07/15/17               Reason for consult:  Facility Placement                Permission sought to share information with:  Oceanographer granted to share information::  No(pt disoriented spoke with sister/ HCPOA)  Name::     [name]  Agency::  SNF  Relationship::  sister/HCPOA  Contact Information:     Housing/Transportation Living arrangements for the past 2 months:  Single Family Home Source of Information:  Adult Children Patient Interpreter Needed:  None Criminal Activity/Legal Involvement Pertinent to Current Situation/Hospitalization:  No - Comment as needed Significant Relationships:  Siblings Lives with:  Self Do you feel safe going back to the place where you live?  No Need for family participation in patient care:  Yes (Comment)(decision making)  Care giving concerns:  Pt lives at home alone per sister he was having trouble with ADL prior to admission from previous stroke but it had gotten worse over past 2 months.  Patient now needing max assist with mobility and would not be safe to be at home alone.   Social Worker assessment / plan:  CSW spoke with sister/HCPOA about plan for pt at time of DC.  Sister states pt has been to rehab years ago after previous stroke and understands SNF and SNF referral process.  Sister expressed concerns about LTC following rehab if pt does not improve enough- shared that she had applied for medicaid with patient 6 months ago and it was denied- CSW encouraged sister to look into applying again but for facility medicaid which should be more lenient on income requirements.  Employment status:  Disabled (Comment on whether or not currently receiving Disability)($1,400/month) Insurance information:  Medicare PT Recommendations:  Skilled Nursing Facility Information / Referral to  community resources:  Skilled Nursing Facility  Patient/Family's Response to care:  Sister is agreeable to short term rehab- hopeful for [facility] which is near her home.  Sister plans to start working on long term plan for patient.  Patient/Family's Understanding of and Emotional Response to Diagnosis, Current Treatment, and Prognosis:  Pt sister very realistic about patient condition and likely long term deficits- hopeful that rehab will help pt improve but understands he will likely need long term facility placement.  Emotional Assessment Appearance:  Appears stated age Attitude/Demeanor/Rapport:  Unable to Assess Affect (typically observed):  Unable to Assess Orientation:  Oriented to Self Alcohol / Substance use:  Not Applicable Psych involvement (Current and /or in the community):  No (Comment)  Discharge Needs  Concerns to be addressed:  Care Coordination Readmission within the last 30 days:  No Current discharge risk:  Physical Impairment Barriers to Discharge:  Continued Medical Work up   Burna Sis, LCSW 07/15/2017, 4:16 PM

## 2017-07-15 NOTE — Progress Notes (Signed)
OT Cancellation Note  Patient Details Name: ZYRELL MAMOLA MRN: [id number] DOB: July 11, 1960   Cancelled Treatment:    Reason Eval/Treat Not Completed: Patient at procedure or test/ unavailable (HD); will follow up as schedule allows.  Marcy Siren, OT Pager [phone] 07/15/2017   Orlando Penner 07/15/2017, 9:02 AM

## 2017-07-15 NOTE — Progress Notes (Signed)
ANTICOAGULATION CONSULT NOTE  Pharmacy Consult for heparin Indication: LV thrombus/CVA   Heparin Dosing Weight: 86.8 kg  Labs: Recent Labs    07/12/17 1432 07/12/17 2139 07/13/17 0226 07/13/17 0734 07/13/17 1419  07/14/17 0614 07/14/17 1039 07/14/17 1817 07/15/17 0500 07/15/17 0745 07/15/17 0747  HGB  --   --   --   --   --   --   --   --   --   --  10.3*  --   HCT  --   --   --   --   --   --   --   --   --   --  30.8*  --   PLT  --   --   --   --   --   --   --   --   --   --  260  --   LABPROT  --   --   --   --  13.1  --  13.4  --   --  15.5*  --   --   INR  --   --   --   --  1.00  --  1.03  --   --  1.24  --   --   HEPARINUNFRC  --   --  0.28*  --  0.39   < > <0.10* 0.16* 0.30 0.34  --   --   CREATININE  --   --   --  8.04*  --   --   --   --   --   --   --  12.89*  TROPONINI 1.88* 1.84* 1.66*  --   --   --   --   --   --   --   --   --    < > = values in this interval not displayed.    Assessment: 60 yom presenting with CVA (did not receive TPA, s/p mechanical thrombectomy) with subsequent tachyarrhythmias and elevation in troponin. Pharmacy dosing IV heparin for ACS. CCM spoke with Neuro and no contraindication to anticoagulation.  Heparin level this morning remains therapeutic (HL 0.34 << 0.3, goal of 0.3-0.5). INR today is also therapeutic but starting to trend up (INR 1.24 << 1.03, goal of 2-3). Hgb/Hct slight drop, plts wnl - no bleeding noted at this time.   Goal of Therapy:  Heparin level 0.3-0.5 units/ml, no boluses INR 2-3 Monitor platelets by anticoagulation protocol: Yes   Plan:  Continue Heparin at 1350 units/hr (13.5 ml/hr) Warfarin 7.5 mg x 1 dose at 1800 today Will continue to monitor for any signs/symptoms of bleeding and will follow up with heparin level and PT/INR in the a.m.   Thank you for allowing pharmacy to be a part of this patient's care.  Georgina Pillion, PharmD, BCPS Clinical Pharmacist Pager: [phone] Clinical phone for 07/15/2017  from 7a-3:30p: [phone] If after 3:30p, please call main pharmacy at: [phone] 07/15/2017 11:03 AM

## 2017-07-15 NOTE — Progress Notes (Addendum)
STROKE TEAM PROGRESS NOTE   SUBJECTIVE (INTERVAL HISTORY) Went to HD this am. Just back. Limited speech until asked if he wants to eat. INR continues to rise. SNF when INR within goal.INR today is 1.24  OBJECTIVE Temp:  [97.7 F (36.5 C)-98.7 F (37.1 C)] 98.2 F (36.8 C) (05/01 0735) Pulse Rate:  [38-89] 76 (05/01 0830) Cardiac Rhythm: Normal sinus rhythm;Bundle branch block (04/30 2000) Resp:  [18-23] 18 (05/01 0750) BP: (116-182)/(72-104) 139/95 (05/01 0830) SpO2:  [98 %-100 %] 100 % (05/01 0735) Weight:  [92.3 kg (203 lb 7.8 oz)] 92.3 kg (203 lb 7.8 oz) (05/01 0735)   IMAGING No results found.   PHYSICAL EXAM General - Well nourished, well developed, in no apparent distress. Cardiovascular - heart rate regular Neurological Exam :- awake, alert, inconsistently answers short simple questions.  Does not follow commands. Mimics some, but not all gestures. No gaze preference. Blinks to visual threat on the left, not on the right.  PERRL. Right facial droop.  Tongue midline.  Right UE flaccid even on pain stimulation. RLE 1/5, will prop with slow return to bed. LUE and LLE purposeful movement and against gravity 5/5.  Left upper forearm enlarged, soft. Bandages CDI. toes upgoing on the R. Sensation is decreased to pain stimuli on RUE and RLE. Unable to test coordination and gait.  ASSESSMENT/PLAN [name] is a 57 y.o. male with history of CAD/MI s/p stenting and CABG, known cardiomyopathy with EF 35-40% (now improved on current TTE), hypertension, hyperlipidemia, cognitive impairment, obesity, seizure, OSA, ESRD on hemodialysis admitted for AMS and right-sided weakness. No tPA given due to outside window. S/p endovascular thrombectomy w/ guidewire only on 4/21  NSTEMI and Arrythmias, NSVT  Following severe HTN and elevated HR 4/24  On BB and high dose statin.  Hx of CAD/MI - s/p stenting and CABG in past  EKG showed PVCs, VT, then on 4/25 remained in sbrady with bigeminy  PACs.   Troponin 0.62->11.47 (4/25, peak)->1.66  Cardiology recommends long term anticoagulation, currently on Heparin gtt bridge to coumadin. 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   Resultant global aphasia, right hemiplegia, ongoing rehab efforts needed. Recovery will likely be incomplete despite best rehab efforts.   CT left MCA infarct  CTA head and neck left CCA 50% stenosis left M2 cutoff and diffuse intracranial stenosis. Out pt f/u with Vasc Sx recommended.   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  No hemorrhage.  No other acute abnormality.    2D Echo EF 40-45%, LV mural thrombus (revised reading per cardiology) long term anticoagulation is recommended  LDL 131  HgbA1c 4.6  D1 thin Liq  No role in triple tx w/Heparin, ASA + Plavix, thus dropped the Plavix on 4/26  DAPT was initially started, and this would be indicated indefinitely for this chronic intracranial stenosis, however with long term anticoagulation, then would favor ASA 81mg  only in addition to anticoag.  Therapy recommendations: SNF   Disposition: SNF when INR 2-3. SW following  History of stroke  12/2013, admitted for hypotension after HD, MRI showed left caudate head acute infarct, chronic bilateral BG and pontine infarcts.  MRA severe ICAD involving bilateral MCAs.  Carotid Doppler negative.  LDL 89 and A1c 5.6.  Discharged with dual antiplatelet and Crestor 40  Follows with Dr. Terrace Arabia at [facility]  CHF with  cardiomyopathy, NSTEMI  EF 35 to 40% in 02/2017  TTE EF 40-45% - No thrombus noted in initial report (later LV thrombus identified). There is akinesis   2/24 pm event with tachy and VTach and other arrythmias  Trop peaked 11.7, no intervention planned   Cardiology consulted  On Heparin gtt with transition to coumadin.   ESRD on HD  Nephrology on board  HD scheduled MWF   Hypertension / hypotension  Off Cardene now  avoid hypotension during HD  Now on metoprolol 12.5mg  bid  SBP goal given severe intracranial stenosis: 120-150s  One BP 182/101 during the night, overall 140-150s  Hyperlipidemia  Home meds: Lipitor 40, increased to max dose 80mg  qhs  LDL 131, goal < 70  Other Stroke Risk Factors  Obstructive sleep apnea, on CPAP at home  Other Active Problems  Cognitive impairment - following with Dr. Terrace Arabia at [facility], continue home meds. High risk for delirium   ?? Seizure, not on meds  Difficult IV access  Hypokalemia, resolved. Replaced.  Contact precaution for MRSA, ESBL, RSV  Leukocytosis 10.1->12.4  Hgb 11.2->10.3. monitor  Hospital day # 9  Annie Main, MSN, APRN, ANVP-BC, AGPCNP-BC Advanced Practice Stroke Nurse [facility] See Amion for Schedule & Pager information 07/15/2017 8:34 AM  I have personally examined this patient, reviewed notes, independently viewed imaging studies, participated in medical decision making and plan of care.ROS completed by me personally and pertinent positives fully documented  I have made any additions or clarifications directly to the above note. Agree with note above. Await INR becoming therapeutic before being transferred to skilled nursing facility for rehabilitation. Delia Heady, MD Medical Director [facility] Pager: [phone] 07/15/2017 2:37 PM   To contact Stroke Continuity provider, please refer to [email or web address]. After hours, contact General Neurology

## 2017-07-15 NOTE — Progress Notes (Signed)
Progress Note  Patient Name: [name] Date of Encounter: 07/15/2017  Primary Cardiologist:   Nanetta Batty, MD   Subjective   Thumbs up seems to indicate no pain or SOB.  Med changes yesterday as below.  Currently tolerating dialysis.    Inpatient Medications    Scheduled Meds: .  stroke: mapping our early stages of recovery book   Does not apply Once  . amLODipine  2.5 mg Oral Daily  . aspirin  324 mg Oral Daily  . atorvastatin  80 mg Oral Daily  . cinacalcet  90 mg Oral Q supper  . febuxostat  40 mg Oral Daily  . feeding supplement (NEPRO CARB STEADY)  237 mL Oral BID BM  . mouth rinse  15 mL Mouth Rinse BID  . memantine  10 mg Oral BID  . metoprolol tartrate  25 mg Oral BID  . sevelamer carbonate  2.4 g Oral TID WC  . Warfarin - Pharmacist Dosing Inpatient   Does not apply q1800   Continuous Infusions: . sodium chloride    . sodium chloride    . sodium chloride    . sodium chloride    . sodium chloride    . heparin 1,350 Units/hr (07/15/17 0600)   PRN Meds: sodium chloride, sodium chloride, sodium chloride, sodium chloride, sodium chloride, acetaminophen **OR** acetaminophen (TYLENOL) oral liquid 160 mg/5 mL **OR** acetaminophen, alteplase, [START ON 07/16/2017] heparin, hydrALAZINE, iohexol, lidocaine (PF), lidocaine-prilocaine, pentafluoroprop-tetrafluoroeth   Vital Signs    Vitals:   07/15/17 0830 07/15/17 0900 07/15/17 0930 07/15/17 1000  BP: (!) 139/95 (!) 142/93 131/86 132/84  Pulse: 76 83 65 76  Resp:      Temp:      TempSrc:      SpO2:      Weight:      Height:        Intake/Output Summary (Last 24 hours) at 07/15/2017 1048 Last data filed at 07/15/2017 0600 Gross per 24 hour  Intake 577.39 ml  Output -  Net 577.39 ml   Filed Weights   07/09/17 1513 07/09/17 1920 07/15/17 0735  Weight: 198 lb 6.6 oz (90 kg) 196 lb 3.4 oz (89 kg) 203 lb 7.8 oz (92.3 kg)    Telemetry    NA.   - Personally Reviewed  ECG    NA - Personally  Reviewed  Physical Exam   GEN: No  acute distress.   Neck: No  JVD Cardiac: RRR, no murmurs, rubs, or gallops.  Respiratory: Clear   to auscultation bilaterally. GI: Soft, nontender, non-distended, normal bowel sounds  MS:   No edema; No deformity. Neuro:      Right hemiparesis.   Psych:   Unable to assess  Labs    Chemistry Recent Labs  Lab 07/08/17 2100  07/12/17 0916 07/13/17 0734 07/15/17 0747  NA 136   < > 136 135 134*  K 3.4*   < > 4.3 4.2 4.7  CL 95*   < > 98* 97* 93*  CO2 25   < > 26 29 27   GLUCOSE 147*   < > 104* 85 94  BUN 23*   < > 44* 23* 52*  CREATININE 11.20*   < > 12.88* 8.04* 12.89*  CALCIUM 10.4*   < > 10.2 9.7 9.4  PROT 7.0  --   --   --   --   ALBUMIN 3.6  --  3.0* 2.9* 3.1*  AST 21  --   --   --   --  ALT <5*  --   --   --   --   ALKPHOS 49  --   --   --   --   BILITOT 0.9  --   --   --   --   GFRNONAA 4*   < > 4* 7* 4*  GFRAA 5*   < > 4* 8* 4*  ANIONGAP 16*   < > 12 9 14    < > = values in this interval not displayed.     Hematology Recent Labs  Lab 07/11/17 0613 07/12/17 0917 07/15/17 0745  WBC 11.2* 10.1 12.4*  RBC 4.31 3.65* 3.35*  HGB 13.6 11.2* 10.3*  HCT 40.4 33.8* 30.8*  MCV 93.7 92.6 91.9  MCH 31.6 30.7 30.7  MCHC 33.7 33.1 33.4  RDW 14.7 14.4 13.9  PLT 297 263 260    Cardiac Enzymes Recent Labs  Lab 07/09/17 0750 07/12/17 1432 07/12/17 2139 07/13/17 0226  TROPONINI 11.47* 1.88* 1.84* 1.66*   No results for input(s): TROPIPOC in the last 168 hours.   BNPNo results for input(s): BNP, PROBNP in the last 168 hours.   DDimer No results for input(s): DDIMER in the last 168 hours.   Radiology    Ct Head Wo Contrast  Result Date: 07/14/2017 CLINICAL DATA:  Follow-up examination for stroke. EXAM: CT HEAD WITHOUT CONTRAST TECHNIQUE: Contiguous axial images were obtained from the base of the skull through the vertex without intravenous contrast. COMPARISON:  Prior CT from 07/08/2017. FINDINGS: Brain: There has been  continued normal interval evolution of previously identified posterior left MCA territory infarct. Previously seen associated hypodensity is less apparent, with a few scattered areas of laminar necrosis now seen within the area of infarction. No evidence for hemorrhagic transformation or other complication. Underlying atrophy with chronic small vessel ischemic disease again noted, stable. No new large vessel territory infarct. No acute intracranial hemorrhage. No mass lesion, midline shift or mass effect. No hydrocephalus. No extra-axial fluid collection. Vascular: No hyperdense vessel. Calcified atherosclerosis at the skull base. Skull: Scalp soft tissues and calvarium demonstrate no acute abnormality. Sinuses/Orbits: Globes and orbital soft tissues within normal limits. Paranasal sinuses are clear. No mastoid effusion. Other: None. IMPRESSION: 1. Continued normal expected interval evolution of left posterior MCA territory infarct with interval development of small volume laminar necrosis. No evidence for hemorrhagic transformation or other complication. 2. No other new acute intracranial abnormality. 3. Stable background cerebral atrophy with chronic small vessel ischemic changes. Electronically Signed   By: Rise Mu M.D.   On: 07/14/2017 02:38    Cardiac Studies   ECHO  07/09/17  Study Conclusions  - Procedure narrative: Limited study for LV function. - Left ventricle: The cavity size was normal. Wall thickness was   increased in a pattern of mild LVH. Systolic function was mildly   to moderately reduced. The estimated ejection fraction was in the   range of 40% to 45%. Filling defect noted at the inferior apex   with Definity contrast - likely represents mural thrombus. The   study is not technically sufficient to allow evaluation of LV   diastolic function.  Impressions:  - Compared to a prior study on 07/06/2017, the LVEF is unchanged at   40-45% with apical wall motion  abnormality and filing defect,   likely representing mural thrombus.  Patient Profile     57 y.o. male with history of CAD (s/p CABG in 2013), ESRD on HD, HTN, CVA, HL, GERD and anemia Admitted with  CVA with R sided hemiplegia.  Underwent IA intergrilin and mechanical thrombolysis.  Failed mechanical thrombectomy.  Labs consistent with NSTEMI  Assessment & Plan    NSTEMI:   On ASA, Lipitor and beta blocker.  Continue current therapy.    CARDIOMYOPATHY:  EF is mildly reduced.  Euvolemic.   Volume per dialysis.    LV THROMBUS:  On heparin.  Warfarin started.  Stop heparin when INR is greater than 2.  INR today 1.24.     NSVT:  Not currently in a unit where I can review the rhythm.    HTN:  NTG paste stopped yesterday (although it was still on his chest today).  Norvasc restarted.  Beta blocker increased.    LEFT MCA STROKE:  Per neurology and primary team.   ESRD:  HD  MWF.    For questions or updates, please contact [facility] Please consult [email or web address] for contact info under Cardiology/STEMI.   Signed, Rollene Rotunda, MD  07/15/2017, 10:48 AM

## 2017-07-15 NOTE — Progress Notes (Signed)
Occupational Therapy Treatment Patient Details Name: [name] MRN: [id number] DOB: 19-Oct-1960 Today's Date: 07/15/2017    History of present illness 57 y.o. male with a history of ESRD, previous stroke. Presenting with new right weakness, aphasia, and neglect. MRI showing acute left MCA territory cortical infarct    OT comments  Pt presents supine in bed, agreeable to OT tx session. Pt requiring Mod hand over hand assist to initiate and completing simple grooming ADLs this session. Pt mostly non-responsive during session, intermittently responds with thumbs up to questions, and only able to intermittently follow one step commands given increased time to process and multimodal cues. Pt continues to demonstrate inattention to R side, though improvements noted in ability to track past midline towards R visual field. Completed gentle stretching/PROM to RUE at bed level. Noted in chart pt's family preferring he transfer to SNF after discharge for continued therapy services as he will not have available family support at home. Feel this recommendation is appropriate and d/c rec's have been updated to reflect. Will continue to follow acutely to progress pt towards established OT goals.   Follow Up Recommendations  SNF;Supervision/Assistance - 24 hour    Equipment Recommendations  Other (comment)(TBD in next venue )          Precautions / Restrictions Precautions Precautions: Fall Restrictions Weight Bearing Restrictions: No                                                                                           ADL either performed or assessed with clinical judgement   ADL Overall ADL's : Needs assistance/impaired     Grooming: Wash/dry face;Moderate assistance;Bed level Grooming Details (indicate cue type and reason): mod hand over hand assist to initiate task                                General ADL Comments: pt  continues to demonstrate decreased attention, cognition, and initiation     Vision   Additional Comments: pt noted to track past midline towards R visual field during session, though does not do so on command              Cognition Arousal/Alertness: Awake/alert Behavior During Therapy: Flat affect Overall Cognitive Status: Impaired/Different from baseline Area of Impairment: Attention;Memory;Following commands;Safety/judgement;Awareness;Problem solving                   Current Attention Level: Focused   Following Commands: Follows one step commands inconsistently Safety/Judgement: Decreased awareness of safety;Decreased awareness of deficits Awareness: Intellectual Problem Solving: Slow processing;Decreased initiation;Difficulty sequencing;Requires verbal cues;Requires tactile cues General Comments: pt only intermittently responding to one step commands this session, delayed processing noted and requiring multimodal cues; intermittently giving thumbs up to respond yes to questions, at times pt also is unresponsive         Exercises General Exercises - Upper Extremity Shoulder Flexion: PROM;Right;10 reps;Supine(approx 0-40*) Elbow Flexion: PROM;Right;Supine;5 reps Elbow Extension: PROM;Right;5 reps;Supine Wrist Flexion: PROM;10 reps;Right;Supine Wrist Extension: PROM;10 reps;Right;Supine Digit Composite Flexion: PROM;10 reps;Right Composite Extension: PROM;Right;10 reps Hand Exercises Forearm Supination:  PROM;10 reps;Right Forearm Pronation: PROM;10 reps;Right Other Exercises Other Exercises: gentle stretching applied to RUE as pt initially with increased tone                 Pertinent Vitals/ Pain       Pain Assessment: Faces Faces Pain Scale: Hurts little more Pain Location: with some RUE movements  Pain Intervention(s): Monitored during session                                                          Frequency  Min 2X/week         Progress Toward Goals  OT Goals(current goals can now be found in the care plan section)  Progress towards OT goals: Progressing toward goals  Acute Rehab OT Goals Patient Stated Goal: none stated OT Goal Formulation: Patient unable to participate in goal setting Time For Goal Achievement: 07/20/17 Potential to Achieve Goals: Good  Plan Discharge plan remains appropriate    AM-PAC PT "6 Clicks" Daily Activity     Outcome Measure   Help from another person eating meals?: A Lot Help from another person taking care of personal grooming?: A Lot Help from another person toileting, which includes using toliet, bedpan, or urinal?: A Lot Help from another person bathing (including washing, rinsing, drying)?: A Lot Help from another person to put on and taking off regular upper body clothing?: A Lot Help from another person to put on and taking off regular lower body clothing?: Total 6 Click Score: 11    End of Session    OT Visit Diagnosis: Unsteadiness on feet (R26.81);Other abnormalities of gait and mobility (R26.89);Other symptoms and signs involving cognitive function;Low vision, both eyes (H54.2)   Activity Tolerance Patient tolerated treatment well;Patient limited by fatigue   Patient Left in bed;with call bell/phone within reach   Nurse Communication Mobility status        Time: 1610-9604 OT Time Calculation (min): 17 min  Charges: OT General Charges $OT Visit: 1 Visit OT Treatments $Therapeutic Activity: 8-22 mins   Marcy Siren, OT Pager [phone] 07/15/2017    [name] 07/15/2017, 3:44 PM

## 2017-07-16 ENCOUNTER — Encounter (HOSPITAL_COMMUNITY): Payer: Self-pay | Admitting: General Practice

## 2017-07-16 LAB — PROTIME-INR
INR: 1.73
Prothrombin Time: 20.1 seconds — ABNORMAL HIGH (ref 11.4–15.2)

## 2017-07-16 LAB — HEPARIN LEVEL (UNFRACTIONATED): Heparin Unfractionated: 0.39 IU/mL (ref 0.30–0.70)

## 2017-07-16 LAB — GLUCOSE, CAPILLARY
GLUCOSE-CAPILLARY: 97 mg/dL (ref 65–99)
GLUCOSE-CAPILLARY: 97 mg/dL (ref 65–99)

## 2017-07-16 MED ORDER — ASPIRIN 81 MG PO CHEW
81.0000 mg | CHEWABLE_TABLET | Freq: Every day | ORAL | Status: DC
  Administered 2017-07-17: 81 mg via ORAL
  Filled 2017-07-16: qty 1

## 2017-07-16 MED ORDER — WARFARIN SODIUM 7.5 MG PO TABS
7.5000 mg | ORAL_TABLET | Freq: Once | ORAL | Status: AC
  Administered 2017-07-16: 7.5 mg via ORAL
  Filled 2017-07-16: qty 1

## 2017-07-16 NOTE — Progress Notes (Signed)
Patient is difficult to assess as he is not speaking, will continue to monitor.

## 2017-07-16 NOTE — Progress Notes (Addendum)
ON CALL NOTE  Notified by patient RN that the patient has been manually disimpacted stools and had rectal bleeding.  He also pulled his IV.  Currently on heparin. No active bleeding noted by the RN.  Recommended obtaining repeat hemoglobin and hematocrit.  If any evidence of drop in hemoglobin or hematocrit, should discontinue heparin.  Otherwise continue heparin. Redirect the patient   - Milon Dikes, MD [facility] Pager: [phone] If 7pm to 7am, please call on call as listed on AMION.    ADDENDUM H&H stable Recheck in AM Heparin per Cards Can use mitt restraints to avoid pt trying to manually disimpact.  -- Milon Dikes, MD [facility] Pager: [phone] If 7pm to 7am, please call on call as listed on AMION.

## 2017-07-16 NOTE — Progress Notes (Signed)
Patient pulled IV out, put order in for new one will resume heparin drip as soon as I have an IV patient is difficult stick.

## 2017-07-16 NOTE — Progress Notes (Addendum)
STROKE TEAM PROGRESS NOTE   SUBJECTIVE (INTERVAL HISTORY) Lying in bed. No new complaints.INR is 1.7 but not yet at goal  OBJECTIVE Temp:  [97.7 F (36.5 C)-98.8 F (37.1 C)] 98.8 F (37.1 C) (05/02 0312) Pulse Rate:  [65-83] 70 (05/02 0312) Cardiac Rhythm: Normal sinus rhythm (05/02 0700) Resp:  [14-18] 14 (05/02 0312) BP: (117-158)/(69-95) 133/69 (05/02 0312) SpO2:  [100 %] 100 % (05/01 2050)   Diet Order           DIET - DYS 1 Room service appropriate? Yes; Fluid consistency: Thin  Diet effective now          IMAGING No results found.   PHYSICAL EXAM General - Well nourished, well developed, in no apparent distress. Cardiovascular - heart rate regular Neurological Exam :- awake, alert, inconsistently answers short simple questions.  Does not follow commands. Mimics some, but not all gestures. No gaze preference. Blinks to visual threat on the left, not on the right.  PERRL. Right facial droop.  Tongue midline.  Right UE flaccid even on pain stimulation. RLE 1/5, will prop with slow return to bed. LUE and LLE purposeful movement and against gravity 5/5.  Left upper forearm enlarged, soft. Bandages CDI. toes upgoing on the R. Sensation is decreased to pain stimuli on RUE and RLE. Unable to test coordination and gait. No change in neuro exam since yesterday.  ASSESSMENT/PLAN [name] is a 57 y.o. male with history of CAD/MI s/p stenting and CABG, known cardiomyopathy with EF 35-40% (now improved on current TTE), hypertension, hyperlipidemia, cognitive impairment, obesity, seizure, OSA, ESRD on hemodialysis admitted for AMS and right-sided weakness. No tPA given due to outside window. S/p endovascular thrombectomy w/ guidewire only on 4/21  NSTEMI and Arrythmias, NSVT  Following severe HTN and elevated HR 4/24  On BB and high dose statin.  Hx of CAD/MI - s/p stenting and CABG in past  EKG showed PVCs, VT, then on 4/25 remained in sbrady with bigeminy PACs.    Troponin 0.62->11.47 (4/25, peak)->1.66  Cardiology recommends long term anticoagulation, currently on Heparin gtt bridge to coumadin. 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   Resultant global aphasia, right hemiplegia, ongoing rehab efforts needed. Recovery will likely be incomplete despite best rehab efforts.   CT left MCA infarct  CTA head and neck left CCA 50% stenosis left M2 cutoff and diffuse intracranial stenosis. Out pt f/u with Vasc Sx recommended.   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  No hemorrhage.  No other acute abnormality.    2D Echo EF 40-45%, LV mural thrombus (revised reading per cardiology) long term anticoagulation is recommended  LDL 131  HgbA1c 4.6  No role in triple tx w/Heparin, ASA + Plavix, thus dropped the Plavix on 4/26  DAPT was initially started, and this would be indicated indefinitely for this chronic intracranial stenosis, however with long term anticoagulation, then would favor ASA 81mg  only in addition to anticoag.  Therapy recommendations: SNF   Disposition: SNF when INR 2-3. SW  following. Hope for tomorrow.  History of stroke  12/2013, admitted for hypotension after HD, MRI showed left caudate head acute infarct, chronic bilateral BG and pontine infarcts.  MRA severe ICAD involving bilateral MCAs.  Carotid Doppler negative.  LDL 89 and A1c 5.6.  Discharged with dual antiplatelet and Crestor 40  Follows with [facility]. Terrace Arabia at [facility]  CHF with  cardiomyopathy, NSTEMI  EF 35 to 40% in 02/2017  TTE EF 40-45% - No thrombus noted in initial report (later LV thrombus identified). There is akinesis   2/24 pm event with tachy and VTach and other arrythmias  Trop peaked 11.7, no intervention planned   Cardiology consulted  On Heparin gtt with transition to coumadin.   ESRD on HD  Nephrology on board  HD MWF   Hypertension / hypotension  Treated with Cardene earlier hospital stay  avoid hypotension during HD  Now on metoprolol 12.5mg  bid  SBP goal given severe intracranial stenosis: 120-150s  One BP 163/94 during the night, overall 100-150s  Hyperlipidemia  Home meds: Lipitor 40, increased to max dose 80mg  qhs  LDL 131, goal < 70  Other Stroke Risk Factors  Obstructive sleep apnea, on CPAP at home  Other Active Problems  Contact precaution for MRSA, ESBL, RSV  Cognitive impairment - following with Dr. Terrace Arabia at [facility], continue home meds. High risk for delirium   ?? Seizure, not on meds  Difficult IV access  Hypokalemia, resolved.   Leukocytosis 10.1->12.4->12.8  Hgb 11.2->10.3->11.8  Hospital day # 9  Annie Main, MSN, APRN, ANVP-BC, AGPCNP-BC Advanced Practice Stroke Nurse [facility] See Amion for Schedule & Pager information 07/16/2017 8:08 AM  I have personally examined this patient, reviewed notes, independently viewed imaging studies, participated in medical decision making and plan of care.ROS completed by me personally and pertinent positives fully documented  I have made any additions or clarifications directly to the above note. Agree with note above.   Delia Heady, MD Medical Director [facility] Pager: [phone] 07/16/2017 4:34 PM   To contact Stroke Continuity provider, please refer to [email or web address]. After hours, contact General Neurology

## 2017-07-16 NOTE — Progress Notes (Signed)
Patient just had large bloody stool, he has been manually disimpacting him self all day possibility of his long finger nail lacerating his rectum is high also he has been on Heparin drip for several days. Will notify attending.

## 2017-07-16 NOTE — Progress Notes (Addendum)
Progress Note  Patient Name: [name] Date of Encounter: 07/16/2017  Primary Cardiologist: Dr. Allyson Sabal  Subjective   Denies CP or SOB via head movements and thumbs up.  Inpatient Medications    Scheduled Meds: .  stroke: mapping our early stages of recovery book   Does not apply Once  . amLODipine  2.5 mg Oral Daily  . aspirin  324 mg Oral Daily  . atorvastatin  80 mg Oral Daily  . cinacalcet  90 mg Oral Q supper  . febuxostat  40 mg Oral Daily  . feeding supplement (NEPRO CARB STEADY)  237 mL Oral BID BM  . mouth rinse  15 mL Mouth Rinse BID  . memantine  10 mg Oral BID  . metoprolol tartrate  25 mg Oral BID  . sevelamer carbonate  2.4 g Oral TID WC  . warfarin  7.5 mg Oral ONCE-1800  . Warfarin - Pharmacist Dosing Inpatient   Does not apply q1800   Continuous Infusions: . sodium chloride Stopped (07/16/17 0300)  . sodium chloride Stopped (07/16/17 0300)  . heparin 1,350 Units/hr (07/16/17 0300)   PRN Meds: sodium chloride, sodium chloride, acetaminophen **OR** acetaminophen (TYLENOL) oral liquid 160 mg/5 mL **OR** acetaminophen, alteplase, heparin, heparin, hydrALAZINE, iohexol, lidocaine (PF), lidocaine-prilocaine, pentafluoroprop-tetrafluoroeth   Vital Signs    Vitals:   07/15/17 2050 07/16/17 0007 07/16/17 0312 07/16/17 0923  BP: 140/79 131/80 133/69 102/65  Pulse: 66  70 (!) 56  Resp: 14  14   Temp: 98.5 F (36.9 C) 98.4 F (36.9 C) 98.8 F (37.1 C) 98.8 F (37.1 C)  TempSrc: Oral Oral Oral Axillary  SpO2: 100%     Weight:      Height:        Intake/Output Summary (Last 24 hours) at 07/16/2017 1052 Last data filed at 07/16/2017 1011 Gross per 24 hour  Intake 1300.5 ml  Output 1000 ml  Net 300.5 ml   Filed Weights   07/09/17 1513 07/09/17 1920 07/15/17 0735  Weight: 198 lb 6.6 oz (90 kg) 196 lb 3.4 oz (89 kg) 203 lb 7.8 oz (92.3 kg)    Telemetry    NSR occ PVCs - Personally Reviewed  Physical Exam   GEN: No acute distress.  HEENT:  Normocephalic, atraumatic, sclera non-icteric. Neck: No JVD or bruits. Cardiac: RRR no murmurs, rubs, or gallops.  Radials/DP/PT 1+ and equal bilaterally.  Respiratory: Clear to auscultation bilaterally. Breathing is unlabored. GI: Soft, nontender, non-distended, BS +x 4. MS: no deformity. Extremities: No clubbing or cyanosis. No edema. Distal pedal pulses are 2+ and equal bilaterally. Neuro:  R hemiparesis. Nonverbal. Seems to understand questions and answers via hand signals and nodding/shaking head Psych: Calm affect  Labs    Chemistry Recent Labs  Lab 07/12/17 0916 07/13/17 0734 07/15/17 0747  NA 136 135 134*  K 4.3 4.2 4.7  CL 98* 97* 93*  CO2 26 29 27   GLUCOSE 104* 85 94  BUN 44* 23* 52*  CREATININE 12.88* 8.04* 12.89*  CALCIUM 10.2 9.7 9.4  ALBUMIN 3.0* 2.9* 3.1*  GFRNONAA 4* 7* 4*  GFRAA 4* 8* 4*  ANIONGAP 12 9 14      Hematology Recent Labs  Lab 07/12/17 0917 07/15/17 0745 07/15/17 1305  WBC 10.1 12.4* 12.8*  RBC 3.65* 3.35* 3.80*  HGB 11.2* 10.3* 11.8*  HCT 33.8* 30.8* 35.5*  MCV 92.6 91.9 93.4  MCH 30.7 30.7 31.1  MCHC 33.1 33.4 33.2  RDW 14.4 13.9 14.0  PLT 263 260  252    Cardiac Enzymes Recent Labs  Lab 07/12/17 1432 07/12/17 2139 07/13/17 0226  TROPONINI 1.88* 1.84* 1.66*   No results for input(s): TROPIPOC in the last 168 hours.   Radiology    No results found.  Patient Profile     57 y.o. male with history of CAD (s/p CABG in 03/2011, PTCA/stenting of LAD via LIMA in 06/2011), ESRDon HD, HTN, CVA, HL, GERD and anemia. Last cath was performed 02/2017 for troponin of 10 and EF 35-40%, with cath showing patent graft to the PLA branch, pitting graft to 8 circumflex marginal branch and patent LIMA to the LAD with a patent stent beyond LIMA insertion, severe apical LAD disease - no  vessel responsible for his non-STEMI although he did have an anteroapical wall motion and LV. Medical therapy recommended for diffuse disease. He was admitted this  admission with with CVA with R sided hemiplegia. Underwent IA intergrilin and mechanical thrombolysis. Failed mechanical thrombectomy. Labs consistent with recurrent NSTEMI, EF 40-45%, found to have mural thrombus in LV.  Assessment & Plan    1. NSTEMI/CAD - had cath 02/2017 with similarly elevated troponin. Peak was 11 this admission. He is being managed medically this admission without CP or SOB. He was on ASA/Plavix as OP given CAD/MI. Neurology felt no role for Plavix so he is now on ASA 324mg . Neurology note makes mention of intending to use 81mg  daily but has not yet dropped the dose; will defer dose adjustment to their service. Will await if Dr. Antoine Poche has any other thoughts on the Plavix dc given his medically managed MI this admission.  2. Cardiomyopathy and LV thrombus - EF slightly improved from 02/2017. On heparin/warfarin per pharmacy with INR goal 2-3. Consider optimization of LV dysfunction regimen with transition of Lopressor to Toprol or carvedilol. Consider changing amlodipine to Imdur given his LV dysfunction and NSTEMI. Would not add ACEI/ARB at present time with softer BP; avoid Entresto/spiro with his ESRD.  3. NSVT - noted earlier this admission. Continue beta blocker.  4. HTN - BP labile, now on the softer side this AM.  5. L MCA stroke - per neurology and primary team.  6. ESRD - on HD.  For questions or updates, please contact [facility] Please consult [email or web address] for contact info under Cardiology/STEMI.  Signed, Laurann Montana, PA-C 07/16/2017, 10:52 AM    History and all data above reviewed.  Patient examined.  I agree with the findings as above. He is answering with a verbal "no" today.  Denies chest pain or SOB. The patient exam reveals COR:RRR  ,  Lungs:   Decreased breath sounds  ,  Abd: Positive bowel sounds, no rebound no guarding, Ext No edema  .  All available labs, radiology testing, previous records reviewed. Agree with documented assessment and plan.    NSTEMI:  I will change to 81 mg ASA and  warfarin.   Plavix is discontinued.  Warfarin for the LV thrombus.  Otherwise for now meds as listed.   Fayrene Fearing Wojciech Willetts  1:59 PM  07/16/2017

## 2017-07-16 NOTE — Progress Notes (Signed)
ANTICOAGULATION CONSULT NOTE  Pharmacy Consult for heparin Indication: LV thrombus/CVA   Heparin Dosing Weight: 86.8 kg  Labs: Recent Labs    07/14/17 0614  07/14/17 1817 07/15/17 0500 07/15/17 0745 07/15/17 0747 07/15/17 1305 07/16/17 0516  HGB  --   --   --   --  10.3*  --  11.8*  --   HCT  --   --   --   --  30.8*  --  35.5*  --   PLT  --   --   --   --  260  --  252  --   LABPROT 13.4  --   --  15.5*  --   --   --  20.1*  INR 1.03  --   --  1.24  --   --   --  1.73  HEPARINUNFRC <0.10*   < > 0.30 0.34  --   --   --  0.39  CREATININE  --   --   --   --   --  12.89*  --   --    < > = values in this interval not displayed.    Assessment: 1 yom presenting with CVA (did not receive TPA, s/p mechanical thrombectomy) with subsequent tachyarrhythmias and elevation in troponin. Pharmacy dosing IV heparin for ACS. CCM spoke with Neuro and no contraindication to anticoagulation.  Heparin level this morning remains therapeutic (HL 0.39, goal of 0.3-0.5). INR today is also sub-therapeutic but starting to trend up (INR 1.73<<1.24 << 1.03, goal of 2-3). Hgb/Hct slight drop, plts wnl - no bleeding noted at this time.   Goal of Therapy:  Heparin level 0.3-0.5 units/ml, no boluses INR 2-3 Monitor platelets by anticoagulation protocol: Yes   Plan:  Continue Heparin at 1350 units/hr (13.5 ml/hr) Warfarin 7.5 mg x 1 dose at 1800 today Will continue to monitor for any signs/symptoms of bleeding and will follow up with heparin level and PT/INR in the a.m.   Thank you for allowing pharmacy to be a part of this patient's care.  Shloimy Michalski A. Jeanella Craze, PharmD, BCPS Clinical Pharmacist [place] Pager: [phone]  Clinical phone for 07/16/2017 from 7a-3:30p: [phone] If after 3:30p, please call main pharmacy at: [phone] 07/16/2017 8:17 AM

## 2017-07-16 NOTE — Progress Notes (Signed)
  Speech Language Pathology Treatment: Dysphagia;Cognitive-Linquistic  Patient Details Name: [name] MRN: [id number] DOB: 19-Sep-1960 Today's Date: 07/16/2017 Time: 0071-2197 SLP Time Calculation (min) (ACUTE ONLY): 35 min  Assessment / Plan / Recommendation Clinical Impression  Pt continues with significant aphasia.  Produces yes/no (verbally and with thumbs-up signs), but reliability of responses is not consistent.  No other verbalizations elicited today, despite max efforts using automatic/overlearned sequences, modeling, and melodic intonation cues. Pt required mod verbal/tactile/visual cues to self-feed, over-shooting target, leaving copious amounts of puree on right side of face and on shirt with no awareness.  Max cues to clear pocketing/clean face on right.  No overt s/s of aspiration. New swallowing precautions sign posted over [facility]. Pt requires full supervision during meals.  SLP will follow for aphasia/dysphagia.  HPI HPI: 57 yo code stroke from home via [facility] to [facility] ED with new R weakness, aphasia and neglect. MRI show cortically based restricted diffusion in the posterior left insula, superior temporal lobe, parietal lobe, and minimally involving the posterior left frontal lobe. Small remote infarcts in the left superior cerebellum, left paramedian pons, left basal ganglia/internal capsule, and right temporal parietal cortex. There is chronic blood products at the remote left basal ganglia infarct. No hemorrhage seen at the acute infarct.      SLP Plan  Continue with current plan of care       Recommendations  Diet recommendations: Dysphagia 1 (puree);Thin liquid Liquids provided via: Cup;Straw Medication Administration: Crushed with puree Supervision: Staff to assist with self feeding;Full supervision/cueing for compensatory strategies Compensations: Slow rate;Small sips/bites;Lingual sweep for clearance of pocketing;Minimize environmental distractions;Follow solids with  liquid Postural Changes and/or Swallow Maneuvers: Seated upright 90 degrees;Upright 30-60 min after meal                Oral Care Recommendations: Oral care BID SLP Visit Diagnosis: Dysphagia, oral phase (R13.11);Aphasia (R47.01) Plan: Continue with current plan of care       GO              [name] L. Samson Frederic, [place] CCC/SLP Pager [phone]   Blenda Mounts [name] 07/16/2017, 3:25 PM

## 2017-07-16 NOTE — Progress Notes (Signed)
[facility] Progress Note  Subjective: no c/o  Vitals:   07/16/17 0007 07/16/17 0312 07/16/17 0923 07/16/17 1233  BP: 131/80 133/69 102/65 131/88  Pulse:  70 (!) 56   Resp:  14    Temp: 98.4 F (36.9 C) 98.8 F (37.1 C) 98.8 F (37.1 C) 98.5 F (36.9 C)  TempSrc: Oral Oral Axillary Axillary  SpO2:      Weight:      Height:        Inpatient medications: .  stroke: mapping our early stages of recovery book   Does not apply Once  . amLODipine  2.5 mg Oral Daily  . [START ON 07/17/2017] aspirin  81 mg Oral Daily  . atorvastatin  80 mg Oral Daily  . cinacalcet  90 mg Oral Q supper  . febuxostat  40 mg Oral Daily  . feeding supplement (NEPRO CARB STEADY)  237 mL Oral BID BM  . mouth rinse  15 mL Mouth Rinse BID  . memantine  10 mg Oral BID  . metoprolol tartrate  25 mg Oral BID  . sevelamer carbonate  2.4 g Oral TID WC  . warfarin  7.5 mg Oral ONCE-1800  . Warfarin - Pharmacist Dosing Inpatient   Does not apply q1800   . sodium chloride Stopped (07/16/17 0300)  . sodium chloride Stopped (07/16/17 0300)  . heparin 1,350 Units/hr (07/16/17 1128)   sodium chloride, sodium chloride, acetaminophen **OR** acetaminophen (TYLENOL) oral liquid 160 mg/5 mL **OR** acetaminophen, alteplase, heparin, heparin, hydrALAZINE, iohexol, lidocaine (PF), lidocaine-prilocaine, pentafluoroprop-tetrafluoroeth  Exam: Alert, aphasic, responds to commands No jvd Chest clear bilat Cor reg no rg Abd soft ntnd no mass or ascites Ext no edema Ox 3, right hemiparesis dense Left upper avf w bruit   Dialysis: [facility] 4.5h  400/800  89kg  2/2.5 bath  Left upper avf  Hep 4000 then 2000 midrun Venofer 50mg  IV qwk Hectorol64mcg IV qHD  Home meds: ASA/atorvastatin/clopidogrel/nitro Clonidine 0.2 BID/metoprolol tartrate 25mg  BID/terazosin [name] 1000mg  2AC TID, and 1BID snacks Uloric 40mg  qd      Impression: 1 acute cva w/ right hemiparesis and aphasia, known bilat MCA disease  and new LV thrombus 2 esrd on hd mwf 3 anemia ckd Hb up 13, no esa 4 mbd ckd calc levels high, holding vdra 5 htn/ vol - 2-3 kg up,  bp meds per cards 6 nstemi per cardiology 7 cardiomyopathy w/ LV thrombus - on iv hep/ coumadin, awaiting INR Rx for dc to snf 7 dispo for snfp   Plan - dialysis friday   Vinson Moselle MD [facility] pager [phone]   07/16/2017, 2:59 PM   Recent Labs  Lab 07/12/17 0916 07/13/17 0734 07/15/17 0747  NA 136 135 134*  K 4.3 4.2 4.7  CL 98* 97* 93*  CO2 26 29 27   GLUCOSE 104* 85 94  BUN 44* 23* 52*  CREATININE 12.88* 8.04* 12.89*  CALCIUM 10.2 9.7 9.4  PHOS 7.9* 5.1* 7.0*   Recent Labs  Lab 07/12/17 0916 07/13/17 0734 07/15/17 0747  ALBUMIN 3.0* 2.9* 3.1*   Recent Labs  Lab 07/12/17 0917 07/15/17 0745 07/15/17 1305  WBC 10.1 12.4* 12.8*  HGB 11.2* 10.3* 11.8*  HCT 33.8* 30.8* 35.5*  MCV 92.6 91.9 93.4  PLT 263 260 252   Iron/TIBC/Ferritin/ %Sat    Component Value Date/Time   IRON 35 (L) 03/05/2017 0106   TIBC 182 (L) 03/05/2017 0106   FERRITIN 844 (H) 03/05/2017 0106   IRONPCTSAT 19  03/05/2017 0106     

## 2017-07-17 DIAGNOSIS — Z8673 Personal history of transient ischemic attack (TIA), and cerebral infarction without residual deficits: Secondary | ICD-10-CM | POA: Diagnosis not present

## 2017-07-17 DIAGNOSIS — I959 Hypotension, unspecified: Secondary | ICD-10-CM | POA: Diagnosis not present

## 2017-07-17 DIAGNOSIS — Z955 Presence of coronary angioplasty implant and graft: Secondary | ICD-10-CM | POA: Diagnosis not present

## 2017-07-17 DIAGNOSIS — W19XXXA Unspecified fall, initial encounter: Secondary | ICD-10-CM | POA: Diagnosis not present

## 2017-07-17 DIAGNOSIS — G4733 Obstructive sleep apnea (adult) (pediatric): Secondary | ICD-10-CM | POA: Diagnosis present

## 2017-07-17 DIAGNOSIS — K922 Gastrointestinal hemorrhage, unspecified: Secondary | ICD-10-CM | POA: Diagnosis present

## 2017-07-17 DIAGNOSIS — I69351 Hemiplegia and hemiparesis following cerebral infarction affecting right dominant side: Secondary | ICD-10-CM | POA: Diagnosis not present

## 2017-07-17 DIAGNOSIS — I509 Heart failure, unspecified: Secondary | ICD-10-CM | POA: Diagnosis not present

## 2017-07-17 DIAGNOSIS — R531 Weakness: Secondary | ICD-10-CM | POA: Diagnosis not present

## 2017-07-17 DIAGNOSIS — I513 Intracardiac thrombosis, not elsewhere classified: Secondary | ICD-10-CM | POA: Diagnosis present

## 2017-07-17 DIAGNOSIS — I639 Cerebral infarction, unspecified: Secondary | ICD-10-CM | POA: Diagnosis not present

## 2017-07-17 DIAGNOSIS — Z7982 Long term (current) use of aspirin: Secondary | ICD-10-CM | POA: Diagnosis not present

## 2017-07-17 DIAGNOSIS — I214 Non-ST elevation (NSTEMI) myocardial infarction: Secondary | ICD-10-CM | POA: Diagnosis not present

## 2017-07-17 DIAGNOSIS — R279 Unspecified lack of coordination: Secondary | ICD-10-CM | POA: Diagnosis not present

## 2017-07-17 DIAGNOSIS — R2689 Other abnormalities of gait and mobility: Secondary | ICD-10-CM | POA: Diagnosis not present

## 2017-07-17 DIAGNOSIS — R4189 Other symptoms and signs involving cognitive functions and awareness: Secondary | ICD-10-CM | POA: Diagnosis not present

## 2017-07-17 DIAGNOSIS — Z992 Dependence on renal dialysis: Secondary | ICD-10-CM | POA: Diagnosis not present

## 2017-07-17 DIAGNOSIS — M109 Gout, unspecified: Secondary | ICD-10-CM | POA: Diagnosis present

## 2017-07-17 DIAGNOSIS — N186 End stage renal disease: Secondary | ICD-10-CM | POA: Diagnosis not present

## 2017-07-17 DIAGNOSIS — I69320 Aphasia following cerebral infarction: Secondary | ICD-10-CM | POA: Diagnosis not present

## 2017-07-17 DIAGNOSIS — K219 Gastro-esophageal reflux disease without esophagitis: Secondary | ICD-10-CM | POA: Diagnosis present

## 2017-07-17 DIAGNOSIS — I63512 Cerebral infarction due to unspecified occlusion or stenosis of left middle cerebral artery: Secondary | ICD-10-CM | POA: Diagnosis not present

## 2017-07-17 DIAGNOSIS — D509 Iron deficiency anemia, unspecified: Secondary | ICD-10-CM | POA: Diagnosis not present

## 2017-07-17 DIAGNOSIS — M6281 Muscle weakness (generalized): Secondary | ICD-10-CM | POA: Diagnosis not present

## 2017-07-17 DIAGNOSIS — F0150 Vascular dementia without behavioral disturbance: Secondary | ICD-10-CM | POA: Diagnosis not present

## 2017-07-17 DIAGNOSIS — N2581 Secondary hyperparathyroidism of renal origin: Secondary | ICD-10-CM | POA: Diagnosis present

## 2017-07-17 DIAGNOSIS — I236 Thrombosis of atrium, auricular appendage, and ventricle as current complications following acute myocardial infarction: Secondary | ICD-10-CM | POA: Diagnosis not present

## 2017-07-17 DIAGNOSIS — I69391 Dysphagia following cerebral infarction: Secondary | ICD-10-CM | POA: Diagnosis not present

## 2017-07-17 DIAGNOSIS — I252 Old myocardial infarction: Secondary | ICD-10-CM | POA: Diagnosis not present

## 2017-07-17 DIAGNOSIS — I2510 Atherosclerotic heart disease of native coronary artery without angina pectoris: Secondary | ICD-10-CM | POA: Diagnosis not present

## 2017-07-17 DIAGNOSIS — I120 Hypertensive chronic kidney disease with stage 5 chronic kidney disease or end stage renal disease: Secondary | ICD-10-CM | POA: Diagnosis not present

## 2017-07-17 DIAGNOSIS — Z7901 Long term (current) use of anticoagulants: Secondary | ICD-10-CM | POA: Diagnosis not present

## 2017-07-17 DIAGNOSIS — R1310 Dysphagia, unspecified: Secondary | ICD-10-CM | POA: Diagnosis not present

## 2017-07-17 DIAGNOSIS — R748 Abnormal levels of other serum enzymes: Secondary | ICD-10-CM | POA: Diagnosis not present

## 2017-07-17 DIAGNOSIS — D72829 Elevated white blood cell count, unspecified: Secondary | ICD-10-CM | POA: Diagnosis present

## 2017-07-17 DIAGNOSIS — Z79899 Other long term (current) drug therapy: Secondary | ICD-10-CM | POA: Diagnosis not present

## 2017-07-17 DIAGNOSIS — D631 Anemia in chronic kidney disease: Secondary | ICD-10-CM | POA: Diagnosis present

## 2017-07-17 DIAGNOSIS — T82858A Stenosis of vascular prosthetic devices, implants and grafts, initial encounter: Secondary | ICD-10-CM | POA: Diagnosis not present

## 2017-07-17 DIAGNOSIS — R4182 Altered mental status, unspecified: Secondary | ICD-10-CM | POA: Diagnosis not present

## 2017-07-17 DIAGNOSIS — R195 Other fecal abnormalities: Secondary | ICD-10-CM | POA: Diagnosis not present

## 2017-07-17 DIAGNOSIS — D649 Anemia, unspecified: Secondary | ICD-10-CM | POA: Diagnosis not present

## 2017-07-17 DIAGNOSIS — Z951 Presence of aortocoronary bypass graft: Secondary | ICD-10-CM | POA: Diagnosis not present

## 2017-07-17 DIAGNOSIS — E785 Hyperlipidemia, unspecified: Secondary | ICD-10-CM | POA: Diagnosis present

## 2017-07-17 DIAGNOSIS — K5900 Constipation, unspecified: Secondary | ICD-10-CM | POA: Diagnosis present

## 2017-07-17 DIAGNOSIS — R404 Transient alteration of awareness: Secondary | ICD-10-CM | POA: Diagnosis not present

## 2017-07-17 DIAGNOSIS — I5042 Chronic combined systolic (congestive) and diastolic (congestive) heart failure: Secondary | ICD-10-CM | POA: Diagnosis not present

## 2017-07-17 DIAGNOSIS — G8111 Spastic hemiplegia affecting right dominant side: Secondary | ICD-10-CM | POA: Diagnosis not present

## 2017-07-17 DIAGNOSIS — I871 Compression of vein: Secondary | ICD-10-CM | POA: Diagnosis not present

## 2017-07-17 DIAGNOSIS — R5383 Other fatigue: Secondary | ICD-10-CM | POA: Diagnosis not present

## 2017-07-17 DIAGNOSIS — G8101 Flaccid hemiplegia affecting right dominant side: Secondary | ICD-10-CM | POA: Diagnosis not present

## 2017-07-17 DIAGNOSIS — R278 Other lack of coordination: Secondary | ICD-10-CM | POA: Diagnosis not present

## 2017-07-17 DIAGNOSIS — I255 Ischemic cardiomyopathy: Secondary | ICD-10-CM | POA: Diagnosis present

## 2017-07-17 DIAGNOSIS — I132 Hypertensive heart and chronic kidney disease with heart failure and with stage 5 chronic kidney disease, or end stage renal disease: Secondary | ICD-10-CM | POA: Diagnosis present

## 2017-07-17 DIAGNOSIS — Z743 Need for continuous supervision: Secondary | ICD-10-CM | POA: Diagnosis not present

## 2017-07-17 DIAGNOSIS — G9341 Metabolic encephalopathy: Secondary | ICD-10-CM | POA: Diagnosis present

## 2017-07-17 LAB — RENAL FUNCTION PANEL
ALBUMIN: 2.9 g/dL — AB (ref 3.5–5.0)
ANION GAP: 13 (ref 5–15)
BUN: 35 mg/dL — ABNORMAL HIGH (ref 6–20)
CO2: 29 mmol/L (ref 22–32)
Calcium: 8.2 mg/dL — ABNORMAL LOW (ref 8.9–10.3)
Chloride: 92 mmol/L — ABNORMAL LOW (ref 101–111)
Creatinine, Ser: 10.42 mg/dL — ABNORMAL HIGH (ref 0.61–1.24)
GFR calc Af Amer: 6 mL/min — ABNORMAL LOW (ref >60)
GFR, EST NON AFRICAN AMERICAN: 5 mL/min — AB (ref >60)
Glucose, Bld: 97 mg/dL (ref 65–99)
PHOSPHORUS: 4.8 mg/dL — AB (ref 2.5–4.6)
POTASSIUM: 4.2 mmol/L (ref 3.5–5.1)
Sodium: 134 mmol/L — ABNORMAL LOW (ref 135–145)

## 2017-07-17 LAB — HEMOGLOBIN AND HEMATOCRIT, BLOOD
HCT: 30.5 % — ABNORMAL LOW (ref 39.0–52.0)
HEMOGLOBIN: 10.1 g/dL — AB (ref 13.0–17.0)

## 2017-07-17 LAB — HEPARIN LEVEL (UNFRACTIONATED): HEPARIN UNFRACTIONATED: 0.2 [IU]/mL — AB (ref 0.30–0.70)

## 2017-07-17 LAB — PROTIME-INR
INR: 2.06
PROTHROMBIN TIME: 23.1 s — AB (ref 11.4–15.2)

## 2017-07-17 MED ORDER — FEBUXOSTAT 40 MG PO TABS: 40.0000 mg | ORAL_TABLET | Freq: Every day | ORAL | 0 refills | Status: DC

## 2017-07-17 MED ORDER — WARFARIN SODIUM 7.5 MG PO TABS
7.5000 mg | ORAL_TABLET | Freq: Once | ORAL | Status: AC
  Administered 2017-07-17: 7.5 mg via ORAL

## 2017-07-17 MED ORDER — ACETAMINOPHEN 325 MG PO TABS: 650.0000 mg | ORAL_TABLET | ORAL | 0 refills | Status: AC | PRN

## 2017-07-17 MED ORDER — HYDRALAZINE HCL 10 MG PO TABS
10.0000 mg | ORAL_TABLET | Freq: Three times a day (TID) | ORAL | Status: DC
  Administered 2017-07-17: 10 mg via ORAL
  Filled 2017-07-17 (×4): qty 1

## 2017-07-17 MED ORDER — ATORVASTATIN CALCIUM 80 MG PO TABS: 80.0000 mg | ORAL_TABLET | Freq: Every day | ORAL | 0 refills | Status: AC

## 2017-07-17 MED ORDER — ASPIRIN 81 MG PO CHEW: 81.0000 mg | CHEWABLE_TABLET | Freq: Every day | ORAL | 0 refills | Status: DC

## 2017-07-17 MED ORDER — WARFARIN SODIUM 5 MG PO TABS: 5.0000 mg | ORAL_TABLET | Freq: Every day | ORAL | 11 refills | Status: DC

## 2017-07-17 MED ORDER — CINACALCET HCL 30 MG PO TABS: 90.0000 mg | ORAL_TABLET | Freq: Every day | ORAL | 0 refills | Status: AC

## 2017-07-17 MED ORDER — NEPRO/CARBSTEADY PO LIQD: 237.0000 mL | Freq: Two times a day (BID) | ORAL | 0 refills | Status: DC

## 2017-07-17 MED ORDER — MEMANTINE HCL 10 MG PO TABS: 10.0000 mg | ORAL_TABLET | Freq: Two times a day (BID) | ORAL | 3 refills | Status: DC

## 2017-07-17 MED ORDER — METOPROLOL TARTRATE 25 MG PO TABS: 25.0000 mg | ORAL_TABLET | Freq: Two times a day (BID) | ORAL | 0 refills | Status: DC

## 2017-07-17 MED ORDER — WARFARIN SODIUM 7.5 MG PO TABS: 7.5000 mg | ORAL_TABLET | Freq: Every day | ORAL | 0 refills | Status: DC

## 2017-07-17 MED ORDER — WARFARIN SODIUM 7.5 MG PO TABS
7.5000 mg | ORAL_TABLET | Freq: Once | ORAL | Status: DC
  Filled 2017-07-17: qty 1

## 2017-07-17 MED ORDER — AMLODIPINE BESYLATE 2.5 MG PO TABS: 2.5000 mg | ORAL_TABLET | Freq: Every day | ORAL | 0 refills | Status: DC

## 2017-07-17 MED ORDER — HYDRALAZINE HCL 10 MG PO TABS: 10.0000 mg | ORAL_TABLET | Freq: Three times a day (TID) | ORAL | 0 refills | Status: DC

## 2017-07-17 NOTE — Progress Notes (Signed)
[facility] Progress Note  Subjective: no c/o seen on hd  Vitals:   07/17/17 0830 07/17/17 0900 07/17/17 0930 07/17/17 1000  BP: (!) 157/82 (!) 159/84 (!) 145/89 137/90  Pulse: 62 73 67 70  Resp: 16 12 17 18   Temp:      TempSrc:      SpO2:      Weight:      Height:        Inpatient medications: .  stroke: mapping our early stages of recovery book   Does not apply Once  . amLODipine  2.5 mg Oral Daily  . aspirin  81 mg Oral Daily  . atorvastatin  80 mg Oral Daily  . cinacalcet  90 mg Oral Q supper  . febuxostat  40 mg Oral Daily  . feeding supplement (NEPRO CARB STEADY)  237 mL Oral BID BM  . mouth rinse  15 mL Mouth Rinse BID  . memantine  10 mg Oral BID  . metoprolol tartrate  25 mg Oral BID  . sevelamer carbonate  2.4 g Oral TID WC  . Warfarin - Pharmacist Dosing Inpatient   Does not apply q1800   . sodium chloride Stopped (07/16/17 0300)  . sodium chloride Stopped (07/16/17 0300)  . heparin 1,350 Units/hr (07/17/17 0130)   sodium chloride, sodium chloride, acetaminophen **OR** acetaminophen (TYLENOL) oral liquid 160 mg/5 mL **OR** acetaminophen, alteplase, heparin, heparin, hydrALAZINE, iohexol, lidocaine (PF), lidocaine-prilocaine, pentafluoroprop-tetrafluoroeth  Exam: Alert, aphasic, responds to commands No jvd Chest clear bilat Cor reg no rg Abd soft ntnd no mass or ascites Ext no edema Ox 3, right hemiparesis dense Left upper avf w bruit   Dialysis[facility] 4.5h  400/800  89kg  2/2.5 bath  Left upper avf  Hep 4000 then 2000 midrun Venofer 50mg  IV qwk Hectorol62mcg IV qHD  Home meds:  asa/atorvastatin/clopidogrel/nitro  clonidine 0.2 BID/metoprolol tartrate 25mg  BID/terazosin  fosrenol 1000mg  2AC TID, and 1BID snacks  uloric 40mg  qd      Impression: 1 acute cva w/ right hemiparesis and aphasia; known bilat mca disease+ new left vent thrombus 2 esrd on hd [facility]wf 3 anemia ckd Hb up 13, no esa 4 mbd ckd calc levels high, holding  vdra 5 htn/ vol +3kg cont norvasc/ lopressor per cardiology 6 nstemi per cardiology 7 cardiomyopathy w/ left vent thrombus - on iv hep/ coumadin pending Rx inr  7 dispo- for snfp   Plan - dialysis today   Vinson Moselle MD [facility] pager [phone]   07/17/2017, 10:50 AM   Recent Labs  Lab 07/13/17 0734 07/15/17 0747 07/17/17 0626  NA 135 134* 134*  K 4.2 4.7 4.2  CL 97* 93* 92*  CO2 29 27 29   GLUCOSE 85 94 97  BUN 23* 52* 35*  CREATININE 8.04* 12.89* 10.42*  CALCIUM 9.7 9.4 8.2*  PHOS 5.1* 7.0* 4.8*   Recent Labs  Lab 07/13/17 0734 07/15/17 0747 07/17/17 0626  ALBUMIN 2.9* 3.1* 2.9*   Recent Labs  Lab 07/12/17 0917 07/15/17 0745 07/15/17 1305 07/17/17 0007  WBC 10.1 12.4* 12.8*  --   HGB 11.2* 10.3* 11.8* 10.1*  HCT 33.8* 30.8* 35.5* 30.5*  MCV 92.6 91.9 93.4  --   PLT 263 260 252  --    Iron/TIBC/Ferritin/ %Sat    Component Value Date/Time   IRON 35 (L) 03/05/2017 0106   TIBC 182 (L) 03/05/2017 0106   FERRITIN 844 (H) 03/05/2017 0106   IRONPCTSAT 19 03/05/2017 0106

## 2017-07-17 NOTE — Progress Notes (Signed)
Patient being D/C to [facility], staff has called facility three times to give report,also left contact number with no results. Patient caught up on medication ( running back to unit from dialysis). IV removed,and AVS given to EMS

## 2017-07-17 NOTE — Progress Notes (Signed)
Please see Dr. Bess Harvest note pertain to restarting Heparin Gtt and using mitts to passively restrain patient from manually disimpacting self.

## 2017-07-17 NOTE — Clinical Social Work Note (Signed)
Clinical Social Worker facilitated patient discharge including contacting patient family and facility to confirm patient discharge plans.  Clinical information faxed to facility and family agreeable with plan.  CSW arranged ambulance transport via [facility] to [facility].  RN to call [phone] for report prior to discharge.  Clinical Social Worker will sign off for now as social work intervention is no longer needed. Please consult us again if new need arises.  Kodie Pick, LCSWA [phone]  

## 2017-07-17 NOTE — Progress Notes (Signed)
ANTICOAGULATION CONSULT NOTE  Pharmacy Consult for heparin Indication: LV thrombus/CVA   Heparin Dosing Weight: 86.8 kg  Labs: Recent Labs    07/15/17 0500  07/15/17 0745 07/15/17 0747 07/15/17 1305 07/16/17 0516 07/17/17 0007 07/17/17 0626 07/17/17 0900  HGB  --    < > 10.3*  --  11.8*  --  10.1*  --   --   HCT  --   --  30.8*  --  35.5*  --  30.5*  --   --   PLT  --   --  260  --  252  --   --   --   --   LABPROT 15.5*  --   --   --   --  20.1*  --   --  23.1*  INR 1.24  --   --   --   --  1.73  --   --  2.06  HEPARINUNFRC 0.34  --   --   --   --  0.39  --   --  0.20*  CREATININE  --   --   --  12.89*  --   --   --  10.42*  --    < > = values in this interval not displayed.    Assessment: 69 yom presenting with CVA (did not receive TPA, s/p mechanical thrombectomy) with subsequent tachyarrhythmias and elevation in troponin. Pharmacy dosing IV heparin for ACS. CCM spoke with Neuro and no contraindication to anticoagulation.  Heparin level this morning is SUBtherapeutic (HL 0.2, goal of 0.3-0.5) however this is reflective of the drip being off and resumed - not quite back to steady state. INR today is therapeutic (INR 2.06 << 1.73, goal of 2-3). Hgb/Hct slight drop, plts wnl - some rectal bleeding noted yesterday evening.  With a new thrombus often times it is recommended to continue the heparin bridge for 24 hours to ensure the INR remains therapeutic however given the patient's concurrent CVA - it was discussed with neurology Pearlean Brownie) and the decision was made to go ahead and d/c the heparin drip.   Goal of Therapy:  Heparin level 0.3-0.5 units/ml, no boluses INR 2-3 Monitor platelets by anticoagulation protocol: Yes   Plan:  D/c Heparin Warfarin 7.5 mg x 1 dose at 1800 today Will continue to monitor for any signs/symptoms of bleeding and will follow up with heparin level and PT/INR in the a.m.   Thank you for allowing pharmacy to be a part of this patient's  care.  Georgina Pillion, PharmD, BCPS Clinical Pharmacist Pager: [phone] Clinical phone for 07/17/2017 from 7a-3:30p: [phone] If after 3:30p, please call main pharmacy at: [phone] 07/17/2017 10:55 AM

## 2017-07-17 NOTE — Discharge Summary (Addendum)
Stroke Discharge Summary  Patient ID: [name]   MRN: [id number]      DOB: 28-Jun-1960  Date of Admission: 07/05/2017 Date of Discharge: 07/17/2017  Attending Physician:  Micki Riley, MD, Stroke MD Consultant(s):   Treatment Team:  Stroke, Md, MD Lauris Poag, MD Lbcardiology, Dory Peru, MD Delano Metz, MD Nephrology,   Patient's PCP:  Corwin Levins, MD  DISCHARGE DIAGNOSIS:  1.  Left MCA stroke due to LV thrombus s/p mechanical thrombolysis with good recanalization 2. NSTEMI,  3. NSVT  Principal Problem:   Acute ischemic left MCA stroke (HCC) Active Problems:   Hyperlipidemia   Essential hypertension   ESRD on dialysis [facility])   Cerebrovascular accident (CVA) due to embolism of middle cerebral artery (HCC)   OSA (obstructive sleep apnea)   Dysphagia, post-stroke   Chronic combined systolic and diastolic congestive heart failure (HCC)   NSTEMI (non-ST elevated myocardial infarction) (HCC)   LV (left ventricular) mural thrombus following MI [facility])   Past Medical History:  Diagnosis Date  . ACHALASIA   . ANEMIA-NOS   . CAD (coronary artery disease), 3 vessel significant disease.    . Cardiomyopathy (HCC) 05/12/2017  . CEREBROVASCULAR ACCIDENT, HX OF   . CHF (congestive heart failure) (HCC)   . CKD (chronic kidney disease) stage 4, GFR 15-29 ml/min (HCC)   . Constipation   . DEGENERATIVE JOINT DISEASE, SHOULDER    knee  . DEPRESSION    2006- not depressed any longer  . Family history of adverse reaction to anesthesia    Mother, hard to awaken  . GERD    no meds required  . Gout    takes Uloric daily  . H/O hiatal hernia   . Headache(784.0)   . Heart attack (HCC)   . History of colon polyps   . History of diastolic dysfunction, grade 2 by echo   . HYPERLIPIDEMIA    takes crestor daily  . Hyperlipidemia   . HYPERTENSION    takes Norvasc,Catapress,Hydralazine,Imdur,and Metoprolol daily  . Impaired glucose tolerance   . Insomnia    takes Ambien  nightly  . NSTEMI (non-ST elevated myocardial infarction), 06/30/11 07/01/2011  . RENAL CALCULUS, HX OF   . RENAL INSUFFICIENCY    12/2013- TThSat  . S/P coronary artery stent placement, PTCA/DES Resolute to mid-LAD via the LIMA graft, and PTCA of apical 95% stenosis 07/05/11 07/04/2011  . S/P PTCA (percutaneous transluminal coronary angioplasty), 06/30/11 07/01/2011  . Seizures (HCC)    INSULIN INDUCED  . Sleep apnea    SLEEP STUDY IN [place] , NO MACHINE YET  1 YEAR  . Stroke (HCC)    93/2005/2006/2007;left sided weakness.  1993 12/2013  . Thyroid disease    Past Surgical History:  Procedure Laterality Date  . ARCH AORTOGRAM N/A 02/19/2012   Procedure: ARCH AORTOGRAM;  Surgeon: Fransisco Hertz, MD;  Location: [facility] CATH LAB;  Service: Cardiovascular;  Laterality: N/A;  . AV FISTULA PLACEMENT  12/02/2011   Procedure: ARTERIOVENOUS (AV) FISTULA CREATION;  Surgeon: Fransisco Hertz, MD;  Location: [facility] OR;  Service: Vascular;  Laterality: Left;  Ultrasound Guided  . AV FISTULA PLACEMENT Left 06/08/2012   Procedure: ARTERIOVENOUS (AV) FISTULA CREATION;  Surgeon: Fransisco Hertz, MD;  Location: [facility] OR;  Service: Vascular;  Laterality: Left;  . BASCILIC VEIN TRANSPOSITION Left 09/06/2014   Procedure: FIRST STAGE BASILIC VEIN TRANSPOSITION ;  Surgeon: Fransisco Hertz, MD;  Location: [facility] OR;  Service: Vascular;  Laterality: Left;  . BASCILIC VEIN TRANSPOSITION Left 12/06/2014   Procedure: SECOND STAGE BRACHIAL VEIN TRANSPOSITION ;  Surgeon: Fransisco Hertz, MD;  Location: [facility] OR;  Service: Vascular;  Laterality: Left;  . CARDIAC CATHETERIZATION    . COLONOSCOPY    . CORONARY ANGIOPLASTY     06/30/11   AT [facility]  . CORONARY ARTERY BYPASS GRAFT  04/01/2011   Procedure: CORONARY ARTERY BYPASS GRAFTING (CABG);  Surgeon: Kathlee Nations Suann Larry, MD;  Location: [facility] OR;  Service: Open Heart Surgery;  Laterality: N/A;  . HERNIA REPAIR  2013  . INSERTION OF DIALYSIS CATHETER  04/01/2011   Procedure: INSERTION OF DIALYSIS CATHETER;  Surgeon:  Nilda Simmer, MD;  Location: [facility] OR;  Service: Vascular;  Laterality: N/A;  . IR ANGIO INTRA EXTRACRAN SEL COM CAROTID INNOMINATE UNI R MOD SED  07/06/2017  . IR ANGIO VERTEBRAL SEL VERTEBRAL BILAT MOD SED  07/06/2017  . IR CT HEAD LTD  07/06/2017  . IR PERCUTANEOUS ART THROMBECTOMY/INFUSION INTRACRANIAL INC DIAG ANGIO  07/06/2017  . KNEE SURGERY Right 01/07/2011   arthroscopy  . LEFT HEART CATH AND CORS/GRAFTS ANGIOGRAPHY N/A 03/05/2017   Procedure: LEFT HEART CATH AND CORS/GRAFTS ANGIOGRAPHY;  Surgeon: Runell Gess, MD;  Location: [facility] INVASIVE CV LAB;  Service: Cardiovascular;  Laterality: N/A;  . LEFT HEART CATHETERIZATION WITH CORONARY ANGIOGRAM N/A 03/28/2011   Procedure: LEFT HEART CATHETERIZATION WITH CORONARY ANGIOGRAM;  Surgeon: Lennette Bihari, MD;  Location: [facility] CATH LAB;  Service: Cardiovascular;  Laterality: N/A;  pending creatnine  . LEFT HEART CATHETERIZATION WITH CORONARY/GRAFT ANGIOGRAM N/A 06/30/2011   Procedure: LEFT HEART CATHETERIZATION WITH Isabel Caprice;  Surgeon: Thurmon Fair, MD;  Location: [facility] CATH LAB;  Service: Cardiovascular;  Laterality: N/A;  . LIGATION OF ARTERIOVENOUS  FISTULA Left 06/08/2012   Procedure: LIGATION OF ARTERIOVENOUS  FISTULA;  Surgeon: Fransisco Hertz, MD;  Location: [facility] OR;  Service: Vascular;  Laterality: Left;  . NM MYOCAR PERF EJECTION FRACTION  06/25/2011   There is evidence of mild ischemia in the basal inferolateral and mid inferolateral regions. (Extent 7 %) The post-stress ejection fraction is 44%. There is mild hypocontractility in the distal inferoapical segment. baseline T wave inversion is noted in leads I, avL, II, V2. This is a low risk scan. The present perfusion study suggests significant myocardial salvage with mild residual ischemia .  Marland Kitchen PERCUTANEOUS CORONARY INTERVENTION-BALLOON ONLY  06/30/2011   Procedure: PERCUTANEOUS CORONARY INTERVENTION-BALLOON ONLY;  Surgeon: Thurmon Fair, MD;  Location: [facility] CATH LAB;  Service:  Cardiovascular;;  . PERCUTANEOUS CORONARY STENT INTERVENTION (PCI-S) N/A 07/04/2011   Procedure: PERCUTANEOUS CORONARY STENT INTERVENTION (PCI-S);  Surgeon: Lennette Bihari, MD;  Location: [facility] CATH LAB;  Service: Cardiovascular;  Laterality: N/A;  . RADIOLOGY WITH ANESTHESIA N/A 07/05/2017   Procedure: RADIOLOGY WITH ANESTHESIA;  Surgeon: Julieanne Cotton, MD;  Location: [facility] OR;  Service: Radiology;  Laterality: N/A;  . REVISION OF ARTERIOVENOUS GORETEX GRAFT Left 12/06/2014   Procedure: REVISION OF ARTERIOVENOUS GORETEX GRAFT USING 60mmx10cm;  Surgeon: Fransisco Hertz, MD;  Location: [facility] OR;  Service: Vascular;  Laterality: Left;  . STOMACH SURGERY  2009   achalasia  . SUBCLAVIAN STENT PLACEMENT  12/20/2011  . TONSILLECTOMY    . TONSILLECTOMY       Allergies as of 07/17/2017      Reactions   Shrimp [shellfish Allergy] Shortness Of Breath   Atorvastatin Other (See Comments)   weakness   Insulins Other (See Comments)   Patient unsure as  to the kind of insulin but states that it has previously caused seizures.  Most recent hospitalization (Jan 2013) received insulin but did not have reaction. LIKELY DUE TO SIGNIFICANT HYPOGLYCEMIA   Simvastatin Other (See Comments)    abnormal liver tests   Ultram [tramadol] Other (See Comments)   Liver enzyme       Medication List    STOP taking these medications   cloNIDine 0.2 MG tablet Commonly known as:  CATAPRES   clopidogrel 75 MG tablet Commonly known as:  PLAVIX     TAKE these medications   acetaminophen 325 MG tablet Commonly known as:  TYLENOL Take 2 tablets (650 mg total) by mouth every 4 (four) hours as needed for mild pain (or temp > 37.5 C (99.5 F)).   amLODipine 2.5 MG tablet Commonly known as:  NORVASC Take 1 tablet (2.5 mg total) by mouth daily. What changed:    medication strength  how much to take   aspirin 81 MG EC tablet Take 1 tablet (81 mg total) by mouth daily. What changed:  Another medication with the same name was  added. Make sure you understand how and when to take each.   aspirin 81 MG chewable tablet Chew 1 tablet (81 mg total) by mouth daily. What changed:  You were already taking a medication with the same name, and this prescription was added. Make sure you understand how and when to take each.   atorvastatin 80 MG tablet Commonly known as:  LIPITOR Take 1 tablet (80 mg total) by mouth daily. What changed:    medication strength  how much to take   cinacalcet 30 MG tablet Commonly known as:  SENSIPAR Take 3 tablets (90 mg total) by mouth daily with supper.   doxercalciferol 4 MCG/2ML injection Commonly known as:  HECTOROL Inject 2.5 mLs (5 mcg total) into the vein every Monday, Wednesday, and Friday with hemodialysis.   febuxostat 40 MG tablet Commonly known as:  ULORIC Take 1 tablet (40 mg total) by mouth daily.   feeding supplement (NEPRO CARB STEADY) Liqd Take 237 mLs by mouth 2 (two) times daily between meals.   ferric gluconate 125 mg in sodium chloride 0.9 % 100 mL Inject 125 mg into the vein every Monday, Wednesday, and Friday with hemodialysis.   hydrALAZINE 10 MG tablet Commonly known as:  APRESOLINE Take 1 tablet (10 mg total) by mouth every 8 (eight) hours.   ketoconazole 2 % cream Commonly known as:  NIZORAL Apply 1 application topically daily.   lanthanum 1000 MG chewable tablet Commonly known as:  [name] Chew 2 tablets (2,000 mg total) by mouth 3 (three) times daily with meals. Yearly physical due in July must see MD for refills   memantine 10 MG tablet Commonly known as:  NAMENDA Take 1 tablet (10 mg total) by mouth 2 (two) times daily.   metoprolol tartrate 25 MG tablet Commonly known as:  LOPRESSOR Take 1 tablet (25 mg total) by mouth 2 (two) times daily. What changed:    medication strength  how much to take   nitroGLYCERIN 0.4 MG SL tablet Commonly known as:  NITROSTAT PLACE 1 TABLET UNDER TONGUE AS NEEDED FOR CHEST PAIN EVERY 5 MINUTES  (MAX 3 DOSES)   terazosin 10 MG capsule Commonly known as:  HYTRIN TAKE 1 CAPSULE BY MOUTH DAILY AT BEDTIME   warfarin 5 MG tablet Commonly known as:  COUMADIN Take 1 tablet (5 mg total) by mouth daily at 6 PM.  zolpidem 10 MG tablet Commonly known as:  AMBIEN Take 1 tablet (10 mg total) by mouth at bedtime as needed. for sleep       LABORATORY STUDIES CBC    Component Value Date/Time   WBC 12.8 (H) 07/15/2017 1305   RBC 3.80 (L) 07/15/2017 1305   HGB 10.1 (L) 07/17/2017 0007   HCT 30.5 (L) 07/17/2017 0007   PLT 252 07/15/2017 1305   MCV 93.4 07/15/2017 1305   MCH 31.1 07/15/2017 1305   MCHC 33.2 07/15/2017 1305   RDW 14.0 07/15/2017 1305   LYMPHSABS 1.1 07/08/2017 2100   MONOABS 0.4 07/08/2017 2100   EOSABS 0.2 07/08/2017 2100   BASOSABS 0.0 07/08/2017 2100   CMP    Component Value Date/Time   NA 134 (L) 07/17/2017 0626   K 4.2 07/17/2017 0626   CL 92 (L) 07/17/2017 0626   CO2 29 07/17/2017 0626   GLUCOSE 97 07/17/2017 0626   BUN 35 (H) 07/17/2017 0626   CREATININE 10.42 (H) 07/17/2017 0626   CALCIUM 8.2 (L) 07/17/2017 0626   PROT 7.0 07/08/2017 2100   PROT 7.0 03/19/2017 1152   ALBUMIN 2.9 (L) 07/17/2017 0626   ALBUMIN 4.4 03/19/2017 1152   AST 21 07/08/2017 2100   ALT <5 (L) 07/08/2017 2100   ALKPHOS 49 07/08/2017 2100   BILITOT 0.9 07/08/2017 2100   BILITOT 0.6 03/19/2017 1152   GFRNONAA 5 (L) 07/17/2017 0626   GFRAA 6 (L) 07/17/2017 0626   COAGS Lab Results  Component Value Date   INR 2.06 07/17/2017   INR 1.73 07/16/2017   INR 1.24 07/15/2017   Lipid Panel    Component Value Date/Time   CHOL 180 07/06/2017 0510   CHOL 208 (H) 03/19/2017 1152   TRIG 98 07/06/2017 0510   HDL 29 (L) 07/06/2017 0510   HDL 40 03/19/2017 1152   CHOLHDL 6.2 07/06/2017 0510   VLDL 20 07/06/2017 0510   LDLCALC 131 (H) 07/06/2017 0510   LDLCALC 139 (H) 03/19/2017 1152   HgbA1C  Lab Results  Component Value Date   HGBA1C 4.6 (L) 07/06/2017   Urinalysis     Component Value Date/Time   COLORURINE YELLOW 12/30/2013 1003   APPEARANCEUR HAZY (A) 12/30/2013 1003   LABSPEC 1.017 12/30/2013 1003   PHURINE 8.5 (H) 12/30/2013 1003   GLUCOSEU NEGATIVE 12/30/2013 1003   GLUCOSEU NEGATIVE 11/10/2012 0954   HGBUR SMALL (A) 12/30/2013 1003   BILIRUBINUR NEGATIVE 12/30/2013 1003   KETONESUR NEGATIVE 12/30/2013 1003   PROTEINUR >300 (A) 12/30/2013 1003   UROBILINOGEN 0.2 12/30/2013 1003   NITRITE NEGATIVE 12/30/2013 1003   LEUKOCYTESUR MODERATE (A) 12/30/2013 1003   Urine Drug Screen     Component Value Date/Time   LABOPIA NONE DETECTED 07/07/2017 1752   COCAINSCRNUR NONE DETECTED 07/07/2017 1752   LABBENZ NONE DETECTED 07/07/2017 1752   AMPHETMU NONE DETECTED 07/07/2017 1752   THCU NONE DETECTED 07/07/2017 1752   LABBARB NONE DETECTED 07/07/2017 1752    Alcohol Level    Component Value Date/Time   ETH <11 12/29/2013 1324     SIGNIFICANT DIAGNOSTIC STUDIES MRI head  07/06/17 IMPRESSION: 1. Acute left MCA territory cortical infarct without detected progression when compared to CTP from yesterday. No hemorrhagic conversion. 2. Continued non visualization of the left MCA inferior division. 3. There is advanced intracranial atherosclerosis with nonvisualized ACAs and severe left P2 segment stenosis. 4. Remote small vessel infarcts as described.    HISTORY OF PRESENT ILLNESS [name] is a  57 y.o. male with history of CAD/MI s/p stenting and CABG, known cardiomyopathy with EF 35-40% (now improved on current TTE), hypertension, hyperlipidemia, cognitive impairment, obesity, seizure, OSA, ESRD on hemodialysis admitted for AMS and right-sided weakness. No tPA given due to outside window. S/p endovascular thrombectomy w/ guidewire only on 4/21    HOSPITAL COURSE NSTEMI and Arrythmias, NSVT  Following severe HTN and elevated HR 4/24  On BB and high dose statin.  Hx of CAD/MI - s/p stenting and CABG in past  EKG showed PVCs,  VT, then on 4/25 remained in sbrady with bigeminy PACs.   Troponin 0.62->11.47 (4/25, peak)->1.66  Cardiology recommends long term anticoagulation, currently on Heparin gtt bridge to coumadin. INR 2.06 at dc  Currently on ASA as well Coumadin  For anticoagulation management, please check PT/INR on Monday, 07/20/2017.  And adjust Coumadin as needed, therapeutic INR between 2 and 3.  Stroke:  left MCA infarct due to LV thrombus s/p mechanical thrombolysis with microguidewire Progressive large vessel intracranial atherosclerosis w/ chronic stenosis bilateral MCAs since 2015.   Resultant global aphasia, right hemiplegia, ongoing rehab efforts needed. Recovery will likely be incomplete despite best rehab efforts.   CT left MCA infarct  CTA head and neck left CCA 50% stenosis left M2 cutoff and diffuse intracranial stenosis. Out pt f/u with Vasc Sx recommended.   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  No hemorrhage.  No other acute abnormality.    2D Echo EF 40-45%, LV mural thrombus (revised reading per cardiology) long term anticoagulation is recommended  LDL 131  HgbA1c 4.6  No role in triple tx w/Heparin, ASA + Plavix, thus dropped the Plavix on 4/26  DAPT was initially started, and this would be indicated indefinitely for this chronic intracranial stenosis, however with long term anticoagulation, then would favor ASA 81mg  only in addition to anticoag.  Therapy recommendations: SNF     History of stroke  12/2013, admitted for hypotension after HD, MRI showed left caudate head acute infarct, chronic bilateral BG and pontine infarcts.  MRA severe ICAD involving bilateral MCAs.  Carotid Doppler negative.  LDL  89 and A1c 5.6.  Discharged with dual antiplatelet and Crestor 40  Follows with Dr. Terrace Arabia at [facility]  CHF with cardiomyopathy, NSTEMI  EF 35 to 40% in 02/2017  TTE EF 40-45% - No thrombus noted in initial report (later LV thrombus identified). There is akinesis   2/24 pm event with tachy and VTach and other arrythmias  Trop peaked 11.7, no intervention planned   Cardiology consulted  On Heparin gtt with transition to coumadin.   ESRD on HD  Nephrology following  HD MWF   Hypertension / hypotension  Treated with Cardene earlier hospital stay  avoid hypotension during HD  Now on metoprolol 12.5mg  bid  SBP goal given severe intracranial stenosis: 120-150s  One BP 163/94 during the night, overall 100-150s  Hyperlipidemia  Home meds: Lipitor 40, increased to max dose 80mg  qhs  LDL 131, goal < 70  Other Stroke Risk Factors  Obstructive sleep apnea, on CPAP at home  Other Active Problems  Contact precaution for MRSA, ESBL, RSV  Cognitive impairment - following with Dr. Terrace Arabia at [facility], continue home meds. High risk for  delirium   ?? Seizure, not on meds  Difficult IV access  Hypokalemia, resolved.   Leukocytosis 10.1->12.4->12.8  Hgb 11.2->10.3->11.8-->10.1     DISCHARGE EXAM Blood pressure (!) 160/82, pulse 67, temperature 97.8 F (36.6 C), temperature source Oral, resp. rate 18, height 5\' 11"  (1.803 m), weight 89.1 kg (196 lb 6.9 oz), SpO2 100 %. PHYSICAL EXAM General - Well nourished, well developed, in no apparent distress. Cardiovascular - heart rate regular Neurological Exam :- awake, alert, inconsistently answers short simple questions.  Does not follow commands.   No gaze preference. Blinks to visual threat on the left, not on the right.  PERRL. Right facial droop.   Tongue midline. Left facial weakness,  Right UE flaccid even on pain stimulation. RLE 1/5, will prop with slow return to bed. LUE and LLE purposeful movement and against gravity 5/5.   Left upper forearm enlarged, soft.  Able to make thumbs up on left hand. Toes upgoing on the R. Sensation is decreased to pain stimuli on RUE and RLE. Unable to test coordination and gait.   Discharge Diet    Diet Order                       DIET - DYS 1 Room service appropriate? Yes; Fluid consistency: Thin  Diet effective now         liquids  DISCHARGE PLAN  Disposition: [facility]  aspirin 81 mg daily and warfarin 5mg   daily for secondary stroke prevention.  Please check PT/INR on Monday 5/6  Ongoing risk factor control by Primary Care Physician at time of discharge  Follow-up Corwin Levins, MD in 2 weeks.  Follow-up in [facility] in 6 weeks, office to schedule an appointment.   60 minutes were spent preparing discharge.   Pearl Nyanor- DNP 1:36 PM 07/17/17 I have personally examined this patient, reviewed notes, independently viewed imaging studies, participated in medical decision making and plan of care.ROS completed by me personally and pertinent positives fully documented  I have made any additions or clarifications directly to the above note. Agree with note above.    Delia Heady, MD Medical Director [facility] Pager: [phone] 07/17/2017 4:13 PM

## 2017-07-17 NOTE — Discharge Instructions (Signed)

## 2017-07-17 NOTE — Clinical Social Work Placement (Signed)
   CLINICAL SOCIAL WORK PLACEMENT  NOTE  Date:  07/17/2017  Patient Details  Name: [name] MRN: [id number] Date of Birth: December 10, 1960  Clinical Social Work is seeking post-discharge placement for this patient at the Skilled  Nursing Facility level of care (*CSW will initial, date and re-position this form in  chart as items are completed):  Yes   Patient/family provided with [place] Clinical Social Work Department's list of facilities offering this level of care within the geographic area requested by the patient (or if unable, by the patient's family).  Yes   Patient/family informed of their freedom to choose among providers that offer the needed level of care, that participate in Medicare, Medicaid or managed care program needed by the patient, have an available bed and are willing to accept the patient.  Yes   Patient/family informed of [facility]'s ownership interest in [facility] and [facility], as well as of the fact that they are under no obligation to receive care at these facilities.  PASRR submitted to EDS on       PASRR number received on       Existing PASRR number confirmed on 07/13/17     FL2 transmitted to all facilities in geographic area requested by pt/family on 07/13/17     FL2 transmitted to all facilities within larger geographic area on       Patient informed that his/her managed care company has contracts with or will negotiate with certain facilities, including the following:        Yes   Patient/family informed of bed offers received.  Patient chooses bed at [facility]     Physician recommends and patient chooses bed at      Patient to be transferred to [facility] on 07/17/17.  Patient to be transferred to facility by [facility]     Patient family notified on 07/17/17 of transfer.  Name of family member notified:  [name]     PHYSICIAN Please sign FL2     Additional Comment:     _______________________________________________ Maree Krabbe, LCSW 07/17/2017, 2:22 PM

## 2017-07-17 NOTE — Progress Notes (Signed)
Patients sister called last night and she feels that he hurt his feet when he had his fall that brought him to the ED, I will llet the on coming nurse know of the sisters concerns.

## 2017-07-17 NOTE — Progress Notes (Signed)
Progress Note  Patient Name: [name] Date of Encounter: 07/17/2017  Primary Cardiologist:   Nanetta Batty, MD   Subjective   GI bleeding noted secondary probably to trauma as recorded in notes.  Off heparin.  On therapeutic warfarin.  Denies pain (thumbs up).  No SOB   Inpatient Medications    Scheduled Meds: .  stroke: mapping our early stages of recovery book   Does not apply Once  . amLODipine  2.5 mg Oral Daily  . aspirin  81 mg Oral Daily  . atorvastatin  80 mg Oral Daily  . cinacalcet  90 mg Oral Q supper  . febuxostat  40 mg Oral Daily  . feeding supplement (NEPRO CARB STEADY)  237 mL Oral BID BM  . mouth rinse  15 mL Mouth Rinse BID  . memantine  10 mg Oral BID  . metoprolol tartrate  25 mg Oral BID  . sevelamer carbonate  2.4 g Oral TID WC  . Warfarin - Pharmacist Dosing Inpatient   Does not apply q1800   Continuous Infusions: . sodium chloride Stopped (07/16/17 0300)  . sodium chloride Stopped (07/16/17 0300)  . heparin 1,350 Units/hr (07/17/17 0130)   PRN Meds: sodium chloride, sodium chloride, acetaminophen **OR** acetaminophen (TYLENOL) oral liquid 160 mg/5 mL **OR** acetaminophen, alteplase, heparin, heparin, hydrALAZINE, iohexol, lidocaine (PF), lidocaine-prilocaine, pentafluoroprop-tetrafluoroeth   Vital Signs    Vitals:   07/17/17 0830 07/17/17 0900 07/17/17 0930 07/17/17 1000  BP: (!) 157/82 (!) 159/84 (!) 145/89 137/90  Pulse: 62 73 67 70  Resp: 16 12 17 18   Temp:      TempSrc:      SpO2:      Weight:      Height:        Intake/Output Summary (Last 24 hours) at 07/17/2017 1042 Last data filed at 07/17/2017 0326 Gross per 24 hour  Intake 422.85 ml  Output -  Net 422.85 ml   Filed Weights   07/09/17 1920 07/15/17 0735 07/17/17 0755  Weight: 196 lb 3.4 oz (89 kg) 203 lb 7.8 oz (92.3 kg) 203 lb (92.1 kg)    Telemetry    NA - Personally Reviewed  ECG    NA - Personally Reviewed  Physical Exam   GEN: No acute distress.     Neck: No  JVD Cardiac: RRR, no murmurs, rubs, or gallops.  Respiratory: Clear  to auscultation bilaterally. GI: Soft, nontender, non-distended  MS: No edema; No deformity. Neuro:  Aphasic with left hemiparesis.   Labs    Chemistry Recent Labs  Lab 07/13/17 0734 07/15/17 0747 07/17/17 0626  NA 135 134* 134*  K 4.2 4.7 4.2  CL 97* 93* 92*  CO2 29 27 29   GLUCOSE 85 94 97  BUN 23* 52* 35*  CREATININE 8.04* 12.89* 10.42*  CALCIUM 9.7 9.4 8.2*  ALBUMIN 2.9* 3.1* 2.9*  GFRNONAA 7* 4* 5*  GFRAA 8* 4* 6*  ANIONGAP 9 14 13      Hematology Recent Labs  Lab 07/12/17 0917 07/15/17 0745 07/15/17 1305 07/17/17 0007  WBC 10.1 12.4* 12.8*  --   RBC 3.65* 3.35* 3.80*  --   HGB 11.2* 10.3* 11.8* 10.1*  HCT 33.8* 30.8* 35.5* 30.5*  MCV 92.6 91.9 93.4  --   MCH 30.7 30.7 31.1  --   MCHC 33.1 33.4 33.2  --   RDW 14.4 13.9 14.0  --   PLT 263 260 252  --     Cardiac Enzymes Recent Labs  Lab 07/12/17 1432 07/12/17 2139 07/13/17 0226  TROPONINI 1.88* 1.84* 1.66*   No results for input(s): TROPIPOC in the last 168 hours.   BNPNo results for input(s): BNP, PROBNP in the last 168 hours.   DDimer No results for input(s): DDIMER in the last 168 hours.   Radiology    No results found.  Cardiac Studies   ECHO:  Study Conclusions  - Procedure narrative: Limited study for LV function. - Left ventricle: The cavity size was normal. Wall thickness was   increased in a pattern of mild LVH. Systolic function was mildly   to moderately reduced. The estimated ejection fraction was in the   range of 40% to 45%. Filling defect noted at the inferior apex   with Definity contrast - likely represents mural thrombus. The   study is not technically sufficient to allow evaluation of LV   diastolic function.  Impressions:  - Compared to a prior study on 07/06/2017, the LVEF is unchanged at   40-45% with apical wall motion abnormality and filing defect,   likely representing mural  thrombus.  Patient Profile     57 y.o. male with history of CAD (s/p CABG in 03/2011, PTCA/stenting of LAD via LIMA in 06/2011), ESRDon HD, HTN, CVA, HL, GERD and anemia. Last cath was performed 02/2017 for troponin of 10 and EF 35-40%, with cath showing patent graft to the PLA branch, pitting graft to 8 circumflex marginal branch and patent LIMA to the LAD with a patent stent beyond LIMA insertion, severe apical LAD disease - no vessel responsible for his non-STEMI although he did have an anteroapical wall motion and LV. Medical therapy recommended for diffuse disease. He was admitted this admission with with CVA with R sided hemiplegia. 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    Assessment & Plan    NSTEMI:  Plavix discontinued as he is on warfarin and ASA.     LV Thrombus:  Can hold warfarin if needed if he has continued rectal bleeding or drop in Hgb. I will stop the heparin.    HTN:  BPs have not been well controlled.   Somewhat labile.   I am going to start low dose hydralazine that can be held if BP low.    For questions or updates, please contact [facility] Please consult [email or web address] for contact info under Cardiology/STEMI.   Signed, Rollene Rotunda, MD  07/17/2017, 10:42 AM

## 2017-07-17 NOTE — Progress Notes (Signed)
Patient still manually digging decease with left hand restarted heparin and cleaned patient up again and gave him PRN IV hydralazine 10 mg to bring sbp down below 160 will recheck SBP shortly.

## 2017-07-20 ENCOUNTER — Other Ambulatory Visit: Payer: Self-pay | Admitting: *Deleted

## 2017-07-20 DIAGNOSIS — D631 Anemia in chronic kidney disease: Secondary | ICD-10-CM | POA: Diagnosis not present

## 2017-07-20 DIAGNOSIS — D509 Iron deficiency anemia, unspecified: Secondary | ICD-10-CM | POA: Diagnosis not present

## 2017-07-20 DIAGNOSIS — N186 End stage renal disease: Secondary | ICD-10-CM | POA: Diagnosis not present

## 2017-07-20 DIAGNOSIS — N2581 Secondary hyperparathyroidism of renal origin: Secondary | ICD-10-CM | POA: Diagnosis not present

## 2017-07-20 DIAGNOSIS — I63512 Cerebral infarction due to unspecified occlusion or stenosis of left middle cerebral artery: Secondary | ICD-10-CM | POA: Diagnosis not present

## 2017-07-20 DIAGNOSIS — I214 Non-ST elevation (NSTEMI) myocardial infarction: Secondary | ICD-10-CM

## 2017-07-20 DIAGNOSIS — Z992 Dependence on renal dialysis: Secondary | ICD-10-CM | POA: Diagnosis not present

## 2017-07-20 DIAGNOSIS — I120 Hypertensive chronic kidney disease with stage 5 chronic kidney disease or end stage renal disease: Secondary | ICD-10-CM | POA: Diagnosis not present

## 2017-07-20 NOTE — Consult Note (Signed)
[facility] Care Management follow up.  Chart reviewed. Noted [name] discharged to [facility] on 07/17/17.   Will make referral for [facility] LCSW for follow up while at SNF.   Primary contact person is [name]'s sister [name] [phone].   Raiford Noble, MSN-Ed, RN,BSN [facility] Liaison [phone]

## 2017-07-21 ENCOUNTER — Telehealth: Payer: Self-pay | Admitting: Adult Health

## 2017-07-21 ENCOUNTER — Other Ambulatory Visit: Payer: Self-pay | Admitting: Licensed Clinical Social Worker

## 2017-07-21 DIAGNOSIS — I120 Hypertensive chronic kidney disease with stage 5 chronic kidney disease or end stage renal disease: Secondary | ICD-10-CM | POA: Diagnosis not present

## 2017-07-21 DIAGNOSIS — I63512 Cerebral infarction due to unspecified occlusion or stenosis of left middle cerebral artery: Secondary | ICD-10-CM | POA: Diagnosis not present

## 2017-07-21 DIAGNOSIS — R1310 Dysphagia, unspecified: Secondary | ICD-10-CM | POA: Diagnosis not present

## 2017-07-21 DIAGNOSIS — R2689 Other abnormalities of gait and mobility: Secondary | ICD-10-CM | POA: Diagnosis not present

## 2017-07-21 DIAGNOSIS — N186 End stage renal disease: Secondary | ICD-10-CM | POA: Diagnosis not present

## 2017-07-21 DIAGNOSIS — F0150 Vascular dementia without behavioral disturbance: Secondary | ICD-10-CM | POA: Diagnosis not present

## 2017-07-21 DIAGNOSIS — G8101 Flaccid hemiplegia affecting right dominant side: Secondary | ICD-10-CM | POA: Diagnosis not present

## 2017-07-21 DIAGNOSIS — I69320 Aphasia following cerebral infarction: Secondary | ICD-10-CM | POA: Diagnosis not present

## 2017-07-21 NOTE — Telephone Encounter (Signed)
New Message   Pt c/o medication issue:  1. Name of Medication: atorvastatin (LIPITOR) 80 MG tablet  2. How are you currently taking this medication (dosage and times per day)? Take 1 tablet (80 mg total) by mouth daily  3. Are you having a reaction (difficulty breathing--STAT)? yes  4. What is your medication issue? [name] is calling, stating that the pt is in rehab and would like to know what kind of allergic reaction the pt has with

## 2017-07-21 NOTE — Patient Outreach (Signed)
[facility]) Care Management  [facility] Social Work  07/21/2017  [name] [birth year] [id number]  Subjective:    Objective:   Encounter Medications:  Outpatient Encounter Medications as of 07/21/2017  Medication Sig Note  . acetaminophen (TYLENOL) 325 MG tablet Take 2 tablets (650 mg total) by mouth every 4 (four) hours as needed for mild pain (or temp > 37.5 C (99.5 F)).   Marland Kitchen amLODipine (NORVASC) 2.5 MG tablet Take 1 tablet (2.5 mg total) by mouth daily.   Marland Kitchen aspirin 81 MG chewable tablet Chew 1 tablet (81 mg total) by mouth daily.   Marland Kitchen aspirin EC 81 MG EC tablet Take 1 tablet (81 mg total) by mouth daily.   Marland Kitchen atorvastatin (LIPITOR) 80 MG tablet Take 1 tablet (80 mg total) by mouth daily.   . cinacalcet (SENSIPAR) 30 MG tablet Take 3 tablets (90 mg total) by mouth daily with supper.   Marland Kitchen doxercalciferol (HECTOROL) 4 MCG/2ML injection Inject 2.5 mLs (5 mcg total) into the vein every Monday, Wednesday, and Friday with hemodialysis. 07/07/2017: No last fill record available  . febuxostat (ULORIC) 40 MG tablet Take 1 tablet (40 mg total) by mouth daily.   . ferric gluconate 125 mg in sodium chloride 0.9 % 100 mL Inject 125 mg into the vein every Monday, Wednesday, and Friday with hemodialysis. 07/07/2017: No last fill record available  . hydrALAZINE (APRESOLINE) 10 MG tablet Take 1 tablet (10 mg total) by mouth every 8 (eight) hours.   Marland Kitchen ketoconazole (NIZORAL) 2 % cream Apply 1 application topically daily.   Marland Kitchen lanthanum ([name]) 1000 MG chewable tablet Chew 2 tablets (2,000 mg total) by mouth 3 (three) times daily with meals. Yearly physical due in July must see MD for refills   . memantine (NAMENDA) 10 MG tablet Take 1 tablet (10 mg total) by mouth 2 (two) times daily.   . metoprolol tartrate (LOPRESSOR) 25 MG tablet Take 1 tablet (25 mg total) by mouth 2 (two) times daily.   . nitroGLYCERIN (NITROSTAT) 0.4 MG SL tablet PLACE 1 TABLET UNDER TONGUE AS NEEDED FOR CHEST PAIN EVERY  5 MINUTES (MAX 3 DOSES)   . Nutritional Supplements (FEEDING SUPPLEMENT, NEPRO CARB STEADY,) LIQD Take 237 mLs by mouth 2 (two) times daily between meals.   . terazosin (HYTRIN) 10 MG capsule TAKE 1 CAPSULE BY MOUTH DAILY AT BEDTIME   . warfarin (COUMADIN) 5 MG tablet Take 1 tablet (5 mg total) by mouth daily at 6 PM.   . zolpidem (AMBIEN) 10 MG tablet Take 1 tablet (10 mg total) by mouth at bedtime as needed. for sleep 07/06/2017: #90/90 days filled 05/07/17 [facility] per PMP AWARE   No facility-administered encounter medications on file as of 07/21/2017.     Functional Status:  In your present state of health, do you have any difficulty performing the following activities: 07/15/2017 03/05/2017  Hearing? N N  Vision? N N  Difficulty concentrating or making decisions? Y N  Walking or climbing stairs? Y Y  Dressing or bathing? Y N  Doing errands, shopping? Y N  Some recent data might be hidden    Fall/Depression Screening:  PHQ 2/9 Scores 05/06/2016 10/31/2015 09/13/2014 01/18/2014 11/09/2013 05/05/2011  PHQ - 2 Score 0 0 0 0 0 0    Assessment:   CSW traveled to [place] facility on 07/21/17 to visit client . CSW met with client on 07/21/17 at client's room at [facility] facility.  Sister of client, [name],  was present and met with CSW and client during visit.  [name] is listed in Epic record as contact for client. She is main representative for client at this time. Client has speech challenges. [name] said client spoke fairly well prior to this last stroke. She said client had been walking with a cane previously. Marland Kitchen  He had lived by himself in [place], [place].  She said he has grimaced in indication of pain.  [name] said she had talked with his nurse about pain issue for client. Client is receiving physical therapy and speech therapy.  Client is on mechanical soft diet . Client has weakness on right side.   Client attends dialysis 3 times weekly as scheduled. He has been on  dialysis for about one and one half years. [name] said client was previously weak on his left side from prior stroke. . She said now he has weakness on his right side from a recent stroke. He is eating well since arriving at care facility.  [name] said she is not sure of plan at present for length of care time client may need at facility. She thinks his recovery may be a slow process/  [name] said client may need to stay at facility for a longer period of time to receive needed care.  Client has Medicare insurance at present. [name] said facility social worker had given [facility] application form for client. CSW Theadore Nan spoke with [facility] on 07/21/17 about completion of Medicaid application form for client. CSW encouraged [name] to request expedited application process for Medicaid application for client.  CSW gave client and [place] phone number and name. CSW encouraged that client or [name] please call CSW at [phone] as needed to discuss social work needs of client. CSW thanked client and [name] for allowing CSW to visit with them at room of client on 5//19.  Client and [name] were appreciative of visit by CSW Theadore Nan with them at facility on 07/21/17   Plan:   CSW Nicki Reaper Miley Lindon to call client or [name], sister of client in 3 weeks to assess client needs and status at that time.   Norva Riffle.Boni Maclellan MSW, LCSW Licensed Clinical Social Worker [facility] Care Management [phone]

## 2017-07-21 NOTE — Telephone Encounter (Signed)
Spoke with pt's sister who states the pt was discharged to a rehab facility and wanted to know what the pt's allergy was to Lipitor. Sister was informed that it states it causes weakness and listed as intolerance. Verbalized understanding.

## 2017-07-22 ENCOUNTER — Encounter: Payer: Self-pay | Admitting: Psychology

## 2017-07-22 DIAGNOSIS — N2581 Secondary hyperparathyroidism of renal origin: Secondary | ICD-10-CM | POA: Diagnosis not present

## 2017-07-22 DIAGNOSIS — Z992 Dependence on renal dialysis: Secondary | ICD-10-CM | POA: Diagnosis not present

## 2017-07-22 DIAGNOSIS — D631 Anemia in chronic kidney disease: Secondary | ICD-10-CM | POA: Diagnosis not present

## 2017-07-22 DIAGNOSIS — D509 Iron deficiency anemia, unspecified: Secondary | ICD-10-CM | POA: Diagnosis not present

## 2017-07-22 DIAGNOSIS — N186 End stage renal disease: Secondary | ICD-10-CM | POA: Diagnosis not present

## 2017-07-23 DIAGNOSIS — I69320 Aphasia following cerebral infarction: Secondary | ICD-10-CM | POA: Diagnosis not present

## 2017-07-23 DIAGNOSIS — R2689 Other abnormalities of gait and mobility: Secondary | ICD-10-CM | POA: Diagnosis not present

## 2017-07-23 DIAGNOSIS — G8111 Spastic hemiplegia affecting right dominant side: Secondary | ICD-10-CM | POA: Diagnosis not present

## 2017-07-23 DIAGNOSIS — R1310 Dysphagia, unspecified: Secondary | ICD-10-CM | POA: Diagnosis not present

## 2017-07-24 DIAGNOSIS — Z992 Dependence on renal dialysis: Secondary | ICD-10-CM | POA: Diagnosis not present

## 2017-07-24 DIAGNOSIS — N2581 Secondary hyperparathyroidism of renal origin: Secondary | ICD-10-CM | POA: Diagnosis not present

## 2017-07-24 DIAGNOSIS — N186 End stage renal disease: Secondary | ICD-10-CM | POA: Diagnosis not present

## 2017-07-24 DIAGNOSIS — D509 Iron deficiency anemia, unspecified: Secondary | ICD-10-CM | POA: Diagnosis not present

## 2017-07-24 DIAGNOSIS — D631 Anemia in chronic kidney disease: Secondary | ICD-10-CM | POA: Diagnosis not present

## 2017-07-27 DIAGNOSIS — D631 Anemia in chronic kidney disease: Secondary | ICD-10-CM | POA: Diagnosis not present

## 2017-07-27 DIAGNOSIS — N2581 Secondary hyperparathyroidism of renal origin: Secondary | ICD-10-CM | POA: Diagnosis not present

## 2017-07-27 DIAGNOSIS — D509 Iron deficiency anemia, unspecified: Secondary | ICD-10-CM | POA: Diagnosis not present

## 2017-07-27 DIAGNOSIS — Z992 Dependence on renal dialysis: Secondary | ICD-10-CM | POA: Diagnosis not present

## 2017-07-27 DIAGNOSIS — N186 End stage renal disease: Secondary | ICD-10-CM | POA: Diagnosis not present

## 2017-07-28 DIAGNOSIS — I63512 Cerebral infarction due to unspecified occlusion or stenosis of left middle cerebral artery: Secondary | ICD-10-CM | POA: Diagnosis not present

## 2017-07-28 DIAGNOSIS — W19XXXA Unspecified fall, initial encounter: Secondary | ICD-10-CM | POA: Diagnosis not present

## 2017-07-29 DIAGNOSIS — Z992 Dependence on renal dialysis: Secondary | ICD-10-CM | POA: Diagnosis not present

## 2017-07-29 DIAGNOSIS — D631 Anemia in chronic kidney disease: Secondary | ICD-10-CM | POA: Diagnosis not present

## 2017-07-29 DIAGNOSIS — D509 Iron deficiency anemia, unspecified: Secondary | ICD-10-CM | POA: Diagnosis not present

## 2017-07-29 DIAGNOSIS — N186 End stage renal disease: Secondary | ICD-10-CM | POA: Diagnosis not present

## 2017-07-29 DIAGNOSIS — N2581 Secondary hyperparathyroidism of renal origin: Secondary | ICD-10-CM | POA: Diagnosis not present

## 2017-07-31 DIAGNOSIS — Z992 Dependence on renal dialysis: Secondary | ICD-10-CM | POA: Diagnosis not present

## 2017-07-31 DIAGNOSIS — D509 Iron deficiency anemia, unspecified: Secondary | ICD-10-CM | POA: Diagnosis not present

## 2017-07-31 DIAGNOSIS — D631 Anemia in chronic kidney disease: Secondary | ICD-10-CM | POA: Diagnosis not present

## 2017-07-31 DIAGNOSIS — N2581 Secondary hyperparathyroidism of renal origin: Secondary | ICD-10-CM | POA: Diagnosis not present

## 2017-07-31 DIAGNOSIS — N186 End stage renal disease: Secondary | ICD-10-CM | POA: Diagnosis not present

## 2017-08-03 DIAGNOSIS — D631 Anemia in chronic kidney disease: Secondary | ICD-10-CM | POA: Diagnosis not present

## 2017-08-03 DIAGNOSIS — N186 End stage renal disease: Secondary | ICD-10-CM | POA: Diagnosis not present

## 2017-08-03 DIAGNOSIS — Z992 Dependence on renal dialysis: Secondary | ICD-10-CM | POA: Diagnosis not present

## 2017-08-03 DIAGNOSIS — D509 Iron deficiency anemia, unspecified: Secondary | ICD-10-CM | POA: Diagnosis not present

## 2017-08-03 DIAGNOSIS — N2581 Secondary hyperparathyroidism of renal origin: Secondary | ICD-10-CM | POA: Diagnosis not present

## 2017-08-03 DIAGNOSIS — R1310 Dysphagia, unspecified: Secondary | ICD-10-CM | POA: Diagnosis not present

## 2017-08-03 DIAGNOSIS — R2689 Other abnormalities of gait and mobility: Secondary | ICD-10-CM | POA: Diagnosis not present

## 2017-08-03 DIAGNOSIS — G8111 Spastic hemiplegia affecting right dominant side: Secondary | ICD-10-CM | POA: Diagnosis not present

## 2017-08-03 DIAGNOSIS — I69320 Aphasia following cerebral infarction: Secondary | ICD-10-CM | POA: Diagnosis not present

## 2017-08-05 DIAGNOSIS — N2581 Secondary hyperparathyroidism of renal origin: Secondary | ICD-10-CM | POA: Diagnosis not present

## 2017-08-05 DIAGNOSIS — D509 Iron deficiency anemia, unspecified: Secondary | ICD-10-CM | POA: Diagnosis not present

## 2017-08-05 DIAGNOSIS — N186 End stage renal disease: Secondary | ICD-10-CM | POA: Diagnosis not present

## 2017-08-05 DIAGNOSIS — Z992 Dependence on renal dialysis: Secondary | ICD-10-CM | POA: Diagnosis not present

## 2017-08-05 DIAGNOSIS — D631 Anemia in chronic kidney disease: Secondary | ICD-10-CM | POA: Diagnosis not present

## 2017-08-06 DIAGNOSIS — I871 Compression of vein: Secondary | ICD-10-CM | POA: Diagnosis not present

## 2017-08-06 DIAGNOSIS — N186 End stage renal disease: Secondary | ICD-10-CM | POA: Diagnosis not present

## 2017-08-06 DIAGNOSIS — T82858A Stenosis of vascular prosthetic devices, implants and grafts, initial encounter: Secondary | ICD-10-CM | POA: Diagnosis not present

## 2017-08-06 DIAGNOSIS — Z992 Dependence on renal dialysis: Secondary | ICD-10-CM | POA: Diagnosis not present

## 2017-08-07 ENCOUNTER — Emergency Department (HOSPITAL_COMMUNITY): Payer: Medicare Other

## 2017-08-07 ENCOUNTER — Encounter (HOSPITAL_COMMUNITY): Payer: Self-pay | Admitting: Family Medicine

## 2017-08-07 ENCOUNTER — Inpatient Hospital Stay (HOSPITAL_COMMUNITY)
Admission: EM | Admit: 2017-08-07 | Discharge: 2017-08-10 | DRG: 070 | Disposition: A | Discharge status: Skilled Nursing Facility | Payer: Medicare Other | Attending: Family Medicine | Admitting: Family Medicine

## 2017-08-07 DIAGNOSIS — I69351 Hemiplegia and hemiparesis following cerebral infarction affecting right dominant side: Secondary | ICD-10-CM

## 2017-08-07 DIAGNOSIS — D631 Anemia in chronic kidney disease: Secondary | ICD-10-CM | POA: Diagnosis present

## 2017-08-07 DIAGNOSIS — I959 Hypotension, unspecified: Secondary | ICD-10-CM | POA: Diagnosis present

## 2017-08-07 DIAGNOSIS — Z7901 Long term (current) use of anticoagulants: Secondary | ICD-10-CM

## 2017-08-07 DIAGNOSIS — I252 Old myocardial infarction: Secondary | ICD-10-CM

## 2017-08-07 DIAGNOSIS — I5042 Chronic combined systolic (congestive) and diastolic (congestive) heart failure: Secondary | ICD-10-CM | POA: Diagnosis not present

## 2017-08-07 DIAGNOSIS — R278 Other lack of coordination: Secondary | ICD-10-CM | POA: Diagnosis not present

## 2017-08-07 DIAGNOSIS — I214 Non-ST elevation (NSTEMI) myocardial infarction: Secondary | ICD-10-CM | POA: Diagnosis not present

## 2017-08-07 DIAGNOSIS — E785 Hyperlipidemia, unspecified: Secondary | ICD-10-CM | POA: Diagnosis present

## 2017-08-07 DIAGNOSIS — G9341 Metabolic encephalopathy: Principal | ICD-10-CM | POA: Diagnosis present

## 2017-08-07 DIAGNOSIS — G8111 Spastic hemiplegia affecting right dominant side: Secondary | ICD-10-CM | POA: Diagnosis not present

## 2017-08-07 DIAGNOSIS — M109 Gout, unspecified: Secondary | ICD-10-CM | POA: Diagnosis present

## 2017-08-07 DIAGNOSIS — G4733 Obstructive sleep apnea (adult) (pediatric): Secondary | ICD-10-CM | POA: Diagnosis present

## 2017-08-07 DIAGNOSIS — M2550 Pain in unspecified joint: Secondary | ICD-10-CM | POA: Diagnosis not present

## 2017-08-07 DIAGNOSIS — E876 Hypokalemia: Secondary | ICD-10-CM | POA: Diagnosis present

## 2017-08-07 DIAGNOSIS — N186 End stage renal disease: Secondary | ICD-10-CM | POA: Diagnosis not present

## 2017-08-07 DIAGNOSIS — K5900 Constipation, unspecified: Secondary | ICD-10-CM | POA: Diagnosis present

## 2017-08-07 DIAGNOSIS — I63512 Cerebral infarction due to unspecified occlusion or stenosis of left middle cerebral artery: Secondary | ICD-10-CM | POA: Diagnosis not present

## 2017-08-07 DIAGNOSIS — I255 Ischemic cardiomyopathy: Secondary | ICD-10-CM | POA: Diagnosis present

## 2017-08-07 DIAGNOSIS — R748 Abnormal levels of other serum enzymes: Secondary | ICD-10-CM

## 2017-08-07 DIAGNOSIS — Z955 Presence of coronary angioplasty implant and graft: Secondary | ICD-10-CM | POA: Diagnosis not present

## 2017-08-07 DIAGNOSIS — R195 Other fecal abnormalities: Secondary | ICD-10-CM | POA: Diagnosis present

## 2017-08-07 DIAGNOSIS — K219 Gastro-esophageal reflux disease without esophagitis: Secondary | ICD-10-CM | POA: Diagnosis present

## 2017-08-07 DIAGNOSIS — N2581 Secondary hyperparathyroidism of renal origin: Secondary | ICD-10-CM | POA: Diagnosis present

## 2017-08-07 DIAGNOSIS — Z87891 Personal history of nicotine dependence: Secondary | ICD-10-CM

## 2017-08-07 DIAGNOSIS — R1310 Dysphagia, unspecified: Secondary | ICD-10-CM | POA: Diagnosis not present

## 2017-08-07 DIAGNOSIS — R531 Weakness: Secondary | ICD-10-CM | POA: Diagnosis not present

## 2017-08-07 DIAGNOSIS — F0390 Unspecified dementia without behavioral disturbance: Secondary | ICD-10-CM | POA: Diagnosis not present

## 2017-08-07 DIAGNOSIS — Z7982 Long term (current) use of aspirin: Secondary | ICD-10-CM

## 2017-08-07 DIAGNOSIS — I69320 Aphasia following cerebral infarction: Secondary | ICD-10-CM | POA: Diagnosis not present

## 2017-08-07 DIAGNOSIS — I69391 Dysphagia following cerebral infarction: Secondary | ICD-10-CM | POA: Diagnosis not present

## 2017-08-07 DIAGNOSIS — Z992 Dependence on renal dialysis: Secondary | ICD-10-CM | POA: Diagnosis not present

## 2017-08-07 DIAGNOSIS — D649 Anemia, unspecified: Secondary | ICD-10-CM | POA: Diagnosis not present

## 2017-08-07 DIAGNOSIS — I513 Intracardiac thrombosis, not elsewhere classified: Secondary | ICD-10-CM | POA: Diagnosis present

## 2017-08-07 DIAGNOSIS — Z7401 Bed confinement status: Secondary | ICD-10-CM | POA: Diagnosis not present

## 2017-08-07 DIAGNOSIS — R4182 Altered mental status, unspecified: Secondary | ICD-10-CM | POA: Diagnosis not present

## 2017-08-07 DIAGNOSIS — R7989 Other specified abnormal findings of blood chemistry: Secondary | ICD-10-CM | POA: Diagnosis present

## 2017-08-07 DIAGNOSIS — Z8673 Personal history of transient ischemic attack (TIA), and cerebral infarction without residual deficits: Secondary | ICD-10-CM

## 2017-08-07 DIAGNOSIS — I639 Cerebral infarction, unspecified: Secondary | ICD-10-CM | POA: Diagnosis not present

## 2017-08-07 DIAGNOSIS — D72829 Elevated white blood cell count, unspecified: Secondary | ICD-10-CM | POA: Diagnosis present

## 2017-08-07 DIAGNOSIS — Z951 Presence of aortocoronary bypass graft: Secondary | ICD-10-CM

## 2017-08-07 DIAGNOSIS — I10 Essential (primary) hypertension: Secondary | ICD-10-CM | POA: Diagnosis not present

## 2017-08-07 DIAGNOSIS — M6281 Muscle weakness (generalized): Secondary | ICD-10-CM | POA: Diagnosis not present

## 2017-08-07 DIAGNOSIS — R404 Transient alteration of awareness: Secondary | ICD-10-CM | POA: Diagnosis not present

## 2017-08-07 DIAGNOSIS — I132 Hypertensive heart and chronic kidney disease with heart failure and with stage 5 chronic kidney disease, or end stage renal disease: Secondary | ICD-10-CM | POA: Diagnosis present

## 2017-08-07 DIAGNOSIS — Z79899 Other long term (current) drug therapy: Secondary | ICD-10-CM | POA: Diagnosis not present

## 2017-08-07 DIAGNOSIS — K922 Gastrointestinal hemorrhage, unspecified: Secondary | ICD-10-CM | POA: Diagnosis present

## 2017-08-07 DIAGNOSIS — R2689 Other abnormalities of gait and mobility: Secondary | ICD-10-CM | POA: Diagnosis not present

## 2017-08-07 DIAGNOSIS — I2510 Atherosclerotic heart disease of native coronary artery without angina pectoris: Secondary | ICD-10-CM | POA: Diagnosis present

## 2017-08-07 DIAGNOSIS — R5383 Other fatigue: Secondary | ICD-10-CM | POA: Diagnosis not present

## 2017-08-07 DIAGNOSIS — I69920 Aphasia following unspecified cerebrovascular disease: Secondary | ICD-10-CM

## 2017-08-07 DIAGNOSIS — R778 Other specified abnormalities of plasma proteins: Secondary | ICD-10-CM | POA: Diagnosis present

## 2017-08-07 DIAGNOSIS — I236 Thrombosis of atrium, auricular appendage, and ventricle as current complications following acute myocardial infarction: Secondary | ICD-10-CM | POA: Diagnosis present

## 2017-08-07 LAB — PROTIME-INR
INR: 2.62
Prothrombin Time: 27.8 seconds — ABNORMAL HIGH (ref 11.4–15.2)

## 2017-08-07 LAB — I-STAT TROPONIN, ED
Troponin i, poc: 2.8 ng/mL (ref 0.00–0.08)
Troponin i, poc: 2.67 ng/mL (ref 0.00–0.08)

## 2017-08-07 LAB — BASIC METABOLIC PANEL
Anion gap: 11 (ref 5–15)
BUN: 40 mg/dL — AB (ref 6–20)
CALCIUM: 9.1 mg/dL (ref 8.9–10.3)
CO2: 31 mmol/L (ref 22–32)
CREATININE: 10.19 mg/dL — AB (ref 0.61–1.24)
Chloride: 96 mmol/L — ABNORMAL LOW (ref 101–111)
GFR calc Af Amer: 6 mL/min — ABNORMAL LOW (ref >60)
GFR calc non Af Amer: 5 mL/min — ABNORMAL LOW (ref >60)
GLUCOSE: 95 mg/dL (ref 65–99)
Potassium: 3.4 mmol/L — ABNORMAL LOW (ref 3.5–5.1)
SODIUM: 138 mmol/L (ref 135–145)

## 2017-08-07 LAB — CBC
HCT: 26 % — ABNORMAL LOW (ref 39.0–52.0)
Hemoglobin: 8.4 g/dL — ABNORMAL LOW (ref 13.0–17.0)
MCH: 30.7 pg (ref 26.0–34.0)
MCHC: 32.3 g/dL (ref 30.0–36.0)
MCV: 94.9 fL (ref 78.0–100.0)
PLATELETS: 310 10*3/uL (ref 150–400)
RBC: 2.74 MIL/uL — ABNORMAL LOW (ref 4.22–5.81)
RDW: 13.5 % (ref 11.5–15.5)
WBC: 12.4 10*3/uL — ABNORMAL HIGH (ref 4.0–10.5)

## 2017-08-07 LAB — TYPE AND SCREEN
ABO/RH(D): B POS
Antibody Screen: NEGATIVE

## 2017-08-07 LAB — I-STAT CG4 LACTIC ACID, ED
LACTIC ACID, VENOUS: 1.7 mmol/L (ref 0.5–1.9)
LACTIC ACID, VENOUS: 1.45 mmol/L (ref 0.5–1.9)

## 2017-08-07 LAB — POC OCCULT BLOOD, ED: FECAL OCCULT BLD: POSITIVE — AB

## 2017-08-07 MED ORDER — ONDANSETRON HCL 4 MG/2ML IJ SOLN: 4.0000 mg | Freq: Four times a day (QID) | INTRAMUSCULAR | Status: DC | PRN

## 2017-08-07 MED ORDER — HYDROCODONE-ACETAMINOPHEN 5-325 MG PO TABS: 1.0000 | ORAL_TABLET | ORAL | Status: DC | PRN

## 2017-08-07 MED ORDER — CHLORHEXIDINE GLUCONATE CLOTH 2 % EX PADS
6.0000 | MEDICATED_PAD | Freq: Every day | CUTANEOUS | Status: DC
  Administered 2017-08-09 – 2017-08-10 (×2): 6 via TOPICAL

## 2017-08-07 MED ORDER — IOHEXOL 300 MG/ML  SOLN
100.0000 mL | Freq: Once | INTRAMUSCULAR | Status: AC | PRN
  Administered 2017-08-07: 100 mL via INTRAVENOUS

## 2017-08-07 MED ORDER — SODIUM CHLORIDE 0.9% FLUSH
3.0000 mL | Freq: Two times a day (BID) | INTRAVENOUS | Status: DC
  Administered 2017-08-07 – 2017-08-09 (×4): 3 mL via INTRAVENOUS

## 2017-08-07 MED ORDER — ACETAMINOPHEN 325 MG PO TABS: 650.0000 mg | ORAL_TABLET | Freq: Four times a day (QID) | ORAL | Status: DC | PRN

## 2017-08-07 MED ORDER — ACETAMINOPHEN 650 MG RE SUPP: 650.0000 mg | Freq: Four times a day (QID) | RECTAL | Status: DC | PRN

## 2017-08-07 MED ORDER — ONDANSETRON HCL 4 MG PO TABS: 4.0000 mg | ORAL_TABLET | Freq: Four times a day (QID) | ORAL | Status: DC | PRN

## 2017-08-07 MED ORDER — PANTOPRAZOLE SODIUM 40 MG IV SOLR
40.0000 mg | Freq: Every day | INTRAVENOUS | Status: DC
  Administered 2017-08-08 – 2017-08-09 (×2): 40 mg via INTRAVENOUS
  Filled 2017-08-07 (×3): qty 40

## 2017-08-07 NOTE — ED Notes (Signed)
Bladder scan showed . I/O cath resulted in zero yield.

## 2017-08-07 NOTE — ED Notes (Signed)
Family at bedside, EDP made aware.

## 2017-08-07 NOTE — ED Provider Notes (Signed)
[facility] EMERGENCY DEPARTMENT Provider Note   CSN: [id number] Arrival date & time: 08/07/17  1143     History   Chief Complaint Chief Complaint  Patient presents with  . Fatigue    HPI [name] is a 57 y.o. male.  HPI   57 year old male with history of CAD, CVA, CHF, CKD, depression brought here via EMS from [facility] skilled nursing facility for evaluation of lethargy.  Per triage notes, staff report new onset of lethargy with a low-grade fever that started yesterday.  Patient also vomited yesterday.  Patient is nonverbal with right-sided residual weakness from a recent stroke therefore history is limited.  Patient is also a dialysis patient is due for dialysis today.  EMS document initial blood pressure of 103/67, heart rate of 58, O2 sats at 97%, and initial CBG of 126.  Level 5 caveats applies.  Past Medical History:  Diagnosis Date  . ACHALASIA   . ANEMIA-NOS   . CAD (coronary artery disease), 3 vessel significant disease.    . Cardiomyopathy (HCC) 05/12/2017  . CEREBROVASCULAR ACCIDENT, HX OF   . CHF (congestive heart failure) (HCC)   . CKD (chronic kidney disease) stage 4, GFR 15-29 ml/min (HCC)   . Constipation   . DEGENERATIVE JOINT DISEASE, SHOULDER    knee  . DEPRESSION    2006- not depressed any longer  . Family history of adverse reaction to anesthesia    Mother, hard to awaken  . GERD    no meds required  . Gout    takes Uloric daily  . H/O hiatal hernia   . Headache(784.0)   . Heart attack (HCC)   . History of colon polyps   . History of diastolic dysfunction, grade 2 by echo   . HYPERLIPIDEMIA    takes crestor daily  . Hyperlipidemia   . HYPERTENSION    takes Norvasc,Catapress,Hydralazine,Imdur,and Metoprolol daily  . Impaired glucose tolerance   . Insomnia    takes Ambien nightly  . NSTEMI (non-ST elevated myocardial infarction), 06/30/11 07/01/2011  . RENAL CALCULUS, HX OF   . RENAL INSUFFICIENCY    12/2013-  TThSat  . S/P coronary artery stent placement, PTCA/DES Resolute to mid-LAD via the LIMA graft, and PTCA of apical 95% stenosis 07/05/11 07/04/2011  . S/P PTCA (percutaneous transluminal coronary angioplasty), 06/30/11 07/01/2011  . Seizures (HCC)    INSULIN INDUCED  . Sleep apnea    SLEEP STUDY IN [place] , NO MACHINE YET  1 YEAR  . Stroke (HCC)    93/2005/2006/2007;left sided weakness.  1993 12/2013  . Thyroid disease     Patient Active Problem List   Diagnosis Date Noted  . NSTEMI (non-ST elevated myocardial infarction) (HCC)   . LV (left ventricular) mural thrombus following MI (HCC)   . Chronic combined systolic and diastolic congestive heart failure (HCC) 07/08/2017  . Acute ischemic left MCA stroke (HCC)   . History of CVA (cerebrovascular accident)   . Dysphagia, post-stroke   . Diastolic dysfunction   . Anemia of chronic disease   . 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 Cardiomyopathy (HCC) 05/12/2017  . Thrombocytopenia (HCC) 03/04/2017  . Elevated PSA 11/04/2016  . OSA (obstructive sleep apnea) 01/08/2016  . Mild cognitive impairment 12/11/2015  . Sleep disorder breathing 10/31/2015  . Urethritis 07/17/2015  . End stage renal disease on dialysis (HCC) 12/06/2014  . Pulse irregularity 09/13/2014  . History of stroke 04/15/2014  . Nail disease 01/22/2014  .  CVD (cerebrovascular disease) 12/29/2013  . Acute ischemic left ACA stroke (HCC) 12/29/2013  . Cerebrovascular accident (CVA) due to embolism of middle cerebral artery (HCC) 12/29/2013  . Abnormality of gait 12/22/2013  . Exertional angina (HCC) 06/24/2013  . Venereal disease, unspecified 11/10/2012  . ESRD on dialysis (HCC) 07/09/2012  . Left-sided weakness 07/02/2012  . Subclavian artery stenosis (HCC) 03/26/2012  . Left leg weakness 02/04/2012  . Itching 02/04/2012  . Mouth abscess 11/11/2011  . Increased prostate specific antigen (PSA) velocity 11/11/2011  . Right ankle pain 09/10/2011  . S/P  coronary artery stent placement, PTCA/DES Resolute to mid-LAD via the LIMA graft, and PTCA of apical 95% stenosis 06/2011 07/04/2011  . History of diastolic dysfunction, grade 2 by echo 07/03/2011  . S/P PTCA (percutaneous transluminal coronary angioplasty), 06/30/11 07/01/2011  . Stable angina: with acute T wave inversion.And unstable angina 06/30/2011  . Impaired glucose tolerance 05/13/2011  . 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 Preventative health care 05/10/2011  . S/P CABG x 4: 04/01/11-(LIMA to LAD, SVG to PL branch of RCA, Sequential SVG to OM1/2) 04/07/2011  . CAD, 3 vessel significant disease at cath this admission. underwent CABG in Jan 2013 03/28/2011  . Gout 03/27/2011  . Anemia 05/22/2008  . PERIPHERAL VASCULAR DISEASE 05/22/2008  . ACHALASIA 05/22/2008  . GERD 05/22/2008  . Hyperlipidemia 10/25/2006  . Depression 10/25/2006  . Essential hypertension 10/25/2006  . HEMORRHOIDS 10/25/2006  . ALLERGIC RHINITIS 10/25/2006  . DEGENERATIVE JOINT DISEASE, SHOULDER 10/25/2006  . Headache(784.0) 10/25/2006  . CVA (cerebral infarction) 10/25/2006  . RENAL CALCULUS, HX OF 10/25/2006    Past Surgical History:  Procedure Laterality Date  . ARCH AORTOGRAM N/A 02/19/2012   Procedure: ARCH AORTOGRAM;  Surgeon: Fransisco Hertz, MD;  Location: [facility] CATH LAB;  Service: Cardiovascular;  Laterality: N/A;  . AV FISTULA PLACEMENT  12/02/2011   Procedure: ARTERIOVENOUS (AV) FISTULA CREATION;  Surgeon: Fransisco Hertz, MD;  Location: [facility] OR;  Service: Vascular;  Laterality: Left;  Ultrasound Guided  . AV FISTULA PLACEMENT Left 06/08/2012   Procedure: ARTERIOVENOUS (AV) FISTULA CREATION;  Surgeon: Fransisco Hertz, MD;  Location: [facility] OR;  Service: Vascular;  Laterality: Left;  . BASCILIC VEIN TRANSPOSITION Left 09/06/2014   Procedure: FIRST STAGE BASILIC VEIN TRANSPOSITION ;  Surgeon: Fransisco Hertz, MD;  Location: [facility] OR;  Service: Vascular;  Laterality: Left;  . BASCILIC VEIN TRANSPOSITION Left 12/06/2014    Procedure: SECOND STAGE BRACHIAL VEIN TRANSPOSITION ;  Surgeon: Fransisco Hertz, MD;  Location: [facility] OR;  Service: Vascular;  Laterality: Left;  . CARDIAC CATHETERIZATION    . COLONOSCOPY    . CORONARY ANGIOPLASTY     06/30/11   AT [facility]  . CORONARY ARTERY BYPASS GRAFT  04/01/2011   Procedure: CORONARY ARTERY BYPASS GRAFTING (CABG);  Surgeon: Kathlee Nations Suann Larry, MD;  Location: [facility] OR;  Service: Open Heart Surgery;  Laterality: N/A;  . HERNIA REPAIR  2013  . INSERTION OF DIALYSIS CATHETER  04/01/2011   Procedure: INSERTION OF DIALYSIS CATHETER;  Surgeon: Nilda Simmer, MD;  Location: [facility] OR;  Service: Vascular;  Laterality: N/A;  . IR ANGIO INTRA EXTRACRAN SEL COM CAROTID INNOMINATE UNI R MOD SED  07/06/2017  . IR ANGIO VERTEBRAL SEL VERTEBRAL BILAT MOD SED  07/06/2017  . IR CT HEAD LTD  07/06/2017  . IR PERCUTANEOUS ART THROMBECTOMY/INFUSION INTRACRANIAL INC DIAG ANGIO  07/06/2017  . KNEE SURGERY Right 01/07/2011   arthroscopy  . LEFT HEART CATH AND CORS/GRAFTS  ANGIOGRAPHY N/A 03/05/2017   Procedure: LEFT HEART CATH AND CORS/GRAFTS ANGIOGRAPHY;  Surgeon: Runell Gess, MD;  Location: [facility] INVASIVE CV LAB;  Service: Cardiovascular;  Laterality: N/A;  . LEFT HEART CATHETERIZATION WITH CORONARY ANGIOGRAM N/A 03/28/2011   Procedure: LEFT HEART CATHETERIZATION WITH CORONARY ANGIOGRAM;  Surgeon: Lennette Bihari, MD;  Location: [facility] CATH LAB;  Service: Cardiovascular;  Laterality: N/A;  pending creatnine  . LEFT HEART CATHETERIZATION WITH CORONARY/GRAFT ANGIOGRAM N/A 06/30/2011   Procedure: LEFT HEART CATHETERIZATION WITH Isabel Caprice;  Surgeon: Thurmon Fair, MD;  Location: [facility] CATH LAB;  Service: Cardiovascular;  Laterality: N/A;  . LIGATION OF ARTERIOVENOUS  FISTULA Left 06/08/2012   Procedure: LIGATION OF ARTERIOVENOUS  FISTULA;  Surgeon: Fransisco Hertz, MD;  Location: [facility] OR;  Service: Vascular;  Laterality: Left;  . NM MYOCAR PERF EJECTION FRACTION  06/25/2011   There is evidence of mild ischemia  in the basal inferolateral and mid inferolateral regions. (Extent 7 %) The post-stress ejection fraction is 44%. There is mild hypocontractility in the distal inferoapical segment. baseline T wave inversion is noted in leads I, avL, II, V2. This is a low risk scan. The present perfusion study suggests significant myocardial salvage with mild residual ischemia .  Marland Kitchen PERCUTANEOUS CORONARY INTERVENTION-BALLOON ONLY  06/30/2011   Procedure: PERCUTANEOUS CORONARY INTERVENTION-BALLOON ONLY;  Surgeon: Thurmon Fair, MD;  Location: [facility] CATH LAB;  Service: Cardiovascular;;  . PERCUTANEOUS CORONARY STENT INTERVENTION (PCI-S) N/A 07/04/2011   Procedure: PERCUTANEOUS CORONARY STENT INTERVENTION (PCI-S);  Surgeon: Lennette Bihari, MD;  Location: [facility] CATH LAB;  Service: Cardiovascular;  Laterality: N/A;  . RADIOLOGY WITH ANESTHESIA N/A 07/05/2017   Procedure: RADIOLOGY WITH ANESTHESIA;  Surgeon: Julieanne Cotton, MD;  Location: [facility] OR;  Service: Radiology;  Laterality: N/A;  . REVISION OF ARTERIOVENOUS GORETEX GRAFT Left 12/06/2014   Procedure: REVISION OF ARTERIOVENOUS GORETEX GRAFT USING 78mmx10cm;  Surgeon: Fransisco Hertz, MD;  Location: [facility] OR;  Service: Vascular;  Laterality: Left;  . STOMACH SURGERY  2009   achalasia  . SUBCLAVIAN STENT PLACEMENT  12/20/2011  . TONSILLECTOMY    . TONSILLECTOMY          Home Medications    Prior to Admission medications   Medication Sig Start Date End Date Taking? Authorizing Provider  acetaminophen (TYLENOL) 325 MG tablet Take 2 tablets (650 mg total) by mouth every 4 (four) hours as needed for mild pain (or temp > 37.5 C (99.5 F)). 07/17/17 08/16/17  Nyanor, Karie Soda, NP  amLODipine (NORVASC) 2.5 MG tablet Take 1 tablet (2.5 mg total) by mouth daily. 07/17/17   Nyanor, Karie Soda, NP  aspirin 81 MG chewable tablet Chew 1 tablet (81 mg total) by mouth daily. 07/17/17   Nyanor, Karie Soda, NP  aspirin EC 81 MG EC tablet Take 1 tablet (81 mg total) by mouth daily. 07/07/11   Dwana Melena,  PA-C  atorvastatin (LIPITOR) 80 MG tablet Take 1 tablet (80 mg total) by mouth daily. 07/17/17   Nyanor, Karie Soda, NP  cinacalcet (SENSIPAR) 30 MG tablet Take 3 tablets (90 mg total) by mouth daily with supper. 07/17/17   Nyanor, Karie Soda, NP  doxercalciferol (HECTOROL) 4 MCG/2ML injection Inject 2.5 mLs (5 mcg total) into the vein every Monday, Wednesday, and Friday with hemodialysis. 03/06/17   Lonia Blood, MD  febuxostat (ULORIC) 40 MG tablet Take 1 tablet (40 mg total) by mouth daily. 07/17/17   Nyanor, Karie Soda, NP  ferric gluconate 125 mg in sodium chloride  0.9 % 100 mL Inject 125 mg into the vein every Monday, Wednesday, and Friday with hemodialysis. 03/06/17   Lonia Blood, MD  hydrALAZINE (APRESOLINE) 10 MG tablet Take 1 tablet (10 mg total) by mouth every 8 (eight) hours. 07/17/17   Nyanor, Karie Soda, NP  ketoconazole (NIZORAL) 2 % cream Apply 1 application topically daily. 12/18/16   Vivi Barrack, DPM  lanthanum ([name]) 1000 MG chewable tablet Chew 2 tablets (2,000 mg total) by mouth 3 (three) times daily with meals. Yearly physical due in July must see MD for refills 09/10/15   Corwin Levins, MD  memantine (NAMENDA) 10 MG tablet Take 1 tablet (10 mg total) by mouth 2 (two) times daily. 07/17/17   Nyanor, Karie Soda, NP  metoprolol tartrate (LOPRESSOR) 25 MG tablet Take 1 tablet (25 mg total) by mouth 2 (two) times daily. 07/17/17   Nyanor, Karie Soda, NP  nitroGLYCERIN (NITROSTAT) 0.4 MG SL tablet PLACE 1 TABLET UNDER TONGUE AS NEEDED FOR CHEST PAIN EVERY 5 MINUTES (MAX 3 DOSES) 05/12/17   Corwin Levins, MD  Nutritional Supplements (FEEDING SUPPLEMENT, NEPRO CARB STEADY,) LIQD Take 237 mLs by mouth 2 (two) times daily between meals. 07/17/17   Nyanor, Karie Soda, NP  terazosin (HYTRIN) 10 MG capsule TAKE 1 CAPSULE BY MOUTH DAILY AT BEDTIME 05/12/17   Corwin Levins, MD  warfarin (COUMADIN) 5 MG tablet Take 1 tablet (5 mg total) by mouth daily at 6 PM. 07/17/17 07/17/18  Nyanor, Karie Soda, NP  zolpidem  (AMBIEN) 10 MG tablet Take 1 tablet (10 mg total) by mouth at bedtime as needed. for sleep 05/12/17   Corwin Levins, MD    Family History Family History  Problem Relation Age of Onset  . Cancer Mother   . Heart disease Mother   . Hyperlipidemia Mother   . Hypertension Mother   . Alzheimer's disease Mother   . Cancer Father   . Heart disease Father   . Hypertension Other   . Stroke Other   . Hyperlipidemia Other     Social History Social History   Tobacco Use  . Smoking status: Never Smoker  . Smokeless tobacco: Former Neurosurgeon    Types: Chew  . Tobacco comment: chewed tobacco   Substance Use Topics  . Alcohol use: Yes    Alcohol/week: 1.2 oz    Types: 2 Shots of liquor per week    Comment: rarely; beer  . Drug use: No     Allergies   Shrimp [shellfish allergy]; Atorvastatin; Insulins; Simvastatin; and Ultram [tramadol]   Review of Systems Review of Systems  Unable to perform ROS: Patient nonverbal     Physical Exam Updated Vital Signs BP 107/74   Pulse (!) 52   SpO2 100%   Physical Exam  Constitutional:  Ill-appearing male laying in bed, somnolent but arousable.  HENT:  Head: Normocephalic and atraumatic.  Mouth/Throat: Oropharynx is clear and moist.  Neck: Normal range of motion. Neck supple.  No nuchal rigidity  Cardiovascular: Normal rate and regular rhythm.  Pulmonary/Chest: Effort normal and breath sounds normal.  Abdominal: Soft. He exhibits no distension. There is no tenderness.  Neurological:  Neurologic exam:  Non verbal, pupils equal round reactive to light, extraocular movements intact  Cranial nerves intact R arm flaccid, L hand with normal grip Sensation not tested Coordination not tested Gait not tested   Nursing note and vitals reviewed.    ED Treatments / Results  Labs (all labs ordered are  listed, but only abnormal results are displayed) Labs Reviewed  BASIC METABOLIC PANEL - Abnormal; Notable for the following components:       Result Value   Potassium 3.4 (*)    Chloride 96 (*)    BUN 40 (*)    Creatinine, Ser 10.19 (*)    GFR calc non Af Amer 5 (*)    GFR calc Af Amer 6 (*)    All other components within normal limits  CBC - Abnormal; Notable for the following components:   WBC 12.4 (*)    RBC 2.74 (*)    Hemoglobin 8.4 (*)    HCT 26.0 (*)    All other components within normal limits  PROTIME-INR - Abnormal; Notable for the following components:   Prothrombin Time 27.8 (*)    All other components within normal limits  I-STAT TROPONIN, ED - Abnormal; Notable for the following components:   Troponin i, poc 2.80 (*)    All other components within normal limits  I-STAT TROPONIN, ED - Abnormal; Notable for the following components:   Troponin i, poc 2.67 (*)    All other components within normal limits  POC OCCULT BLOOD, ED - Abnormal; Notable for the following components:   Fecal Occult Bld POSITIVE (*)    All other components within normal limits  URINALYSIS, ROUTINE W REFLEX MICROSCOPIC  I-STAT CG4 LACTIC ACID, ED  I-STAT CG4 LACTIC ACID, ED  CBG MONITORING, ED    EKG EKG Interpretation  Date/Time:  Friday Aug 07 2017 11:47:13 EDT Ventricular Rate:  55 PR Interval:    QRS Duration: 99 QT Interval:  498 QTC Calculation: 477 R Axis:   -66 Text Interpretation:  Sinus rhythm LAD, consider left anterior fascicular block LVH with secondary repolarization abnormality Anterolateral infarct, age indeterminate Confirmed by Loren Racer ([id number]) on 08/07/2017 2:32:23 PM   Radiology Ct Head Wo Contrast  Result Date: 08/07/2017 CLINICAL DATA:  57 y/o  M; altered mental status. EXAM: CT HEAD WITHOUT CONTRAST TECHNIQUE: Contiguous axial images were obtained from the base of the skull through the vertex without intravenous contrast. COMPARISON:  07/14/2017 CT head.  07/06/2017 MRI of the head. FINDINGS: Brain: Hypoattenuation is present within left posterior insula and left posterolateral frontal lobe  cortex corresponding to the region of infarct on the prior 07/06/2017 MRI of the brain. Stable small chronic infarction within the right temporal lobe, chronic microvascular ischemic changes of the brain, and parenchymal volume loss. No acute infarct, hemorrhage, or mass effect identified. Vascular: Calcific atherosclerosis of carotid siphons and vertebral arteries. No hyperdense vessel identified. Skull: Normal. Negative for fracture or focal lesion. Sinuses/Orbits: No acute finding. Other: None. IMPRESSION: 1. No acute intracranial abnormality identified. 2. Right temporal lobe and left posterior frontal lobe chronic infarctions. Mild chronic microvascular ischemic changes and parenchymal volume loss of the brain. These results were called by telephone at the time of interpretation on 08/07/2017 at 5:30 pm to Dr. Fayrene Helper , who verbally acknowledged these results. Electronically Signed   By: Mitzi Hansen M.D.   On: 08/07/2017 17:34   Ct Abdomen Pelvis W Contrast  Result Date: 08/07/2017 CLINICAL DATA:  57 y/o M; lethargy and low-grade fever. Vomiting yesterday. EXAM: CT ABDOMEN AND PELVIS WITH CONTRAST TECHNIQUE: Multidetector CT imaging of the abdomen and pelvis was performed using the standard protocol following bolus administration of intravenous contrast. CONTRAST:  OMNIPAQUE IOHEXOL 300 MG/ML  SOLN COMPARISON:  None. FINDINGS: Lower chest: No acute abnormality. Severe coronary artery calcific  atherosclerosis. Hepatobiliary: Several subcentimeter lucencies are present within the liver, too small to characterize, but likely representing hepatic cysts. Punctate calcified granuloma in liver segment 8. No gallbladder wall thickening or pericholecystic fluid. No biliary ductal dilatation. Pancreas: Unremarkable. No pancreatic ductal dilatation or surrounding inflammatory changes. Spleen: Normal in size without focal abnormality. Adrenals/Urinary Tract: Normal adrenal glands. Atrophic kidneys  with multiple subcentimeter cyst. No hydronephrosis. Normal bladder. Stomach/Bowel: Stomach is within normal limits. Appendix appears normal. No evidence of bowel wall thickening, distention, or inflammatory changes. Vascular/Lymphatic: Aortic atherosclerosis. No enlarged abdominal or pelvic lymph nodes. Reproductive: Prostate is unremarkable. Other: No abdominal wall hernia or abnormality. No abdominopelvic ascites. Musculoskeletal: No fracture is seen. IMPRESSION: 1. No acute process identified.  No findings of bowel obstruction. 2. Atrophic kidneys. 3. Aortic and coronary artery calcific atherosclerosis. Electronically Signed   By: Mitzi Hansen M.D.   On: 08/07/2017 20:28   Dg Chest Portable 1 View  Result Date: 08/07/2017 CLINICAL DATA:  Fatigue. EXAM: PORTABLE CHEST 1 VIEW COMPARISON:  Radiograph of July 06, 2017. FINDINGS: Stable cardiomediastinal silhouette. Status post coronary artery bypass graft. No pneumothorax or pleural effusion is noted. Both lungs are clear. The visualized skeletal structures are unremarkable. IMPRESSION: No acute cardiopulmonary abnormality seen. Electronically Signed   By: Lupita Raider, M.D.   On: 08/07/2017 14:25    Procedures Fecal disimpaction Date/Time: 08/07/2017 6:42 PM Performed by: Fayrene Helper, PA-C Authorized by: Fayrene Helper, PA-C  Consent: Verbal consent obtained. Risks and benefits: risks, benefits and alternatives were discussed Consent given by: patient and guardian Patient identity confirmed: verbally with patient and arm band Local anesthesia used: no  Anesthesia: Local anesthesia used: no  Sedation: Patient sedated: no  Patient tolerance: Patient tolerated the procedure well with no immediate complications    (including critical care time)  Medications Ordered in ED Medications  Chlorhexidine Gluconate Cloth 2 % PADS 6 each (has no administration in time range)  iohexol (OMNIPAQUE) 300 MG/ML solution 100 mL (100 mLs  Intravenous Contrast Given 08/07/17 1948)     Initial Impression / Assessment and Plan / ED Course  I have reviewed the triage vital signs and the nursing notes.  Pertinent labs & imaging results that were available during my care of the patient were reviewed by me and considered in my medical decision making (see chart for details).     BP 135/72   Pulse (!) 47   Temp 97.8 F (36.6 C) (Rectal)   Resp 16   SpO2 100%    Final Clinical Impressions(s) / ED Diagnoses   Final diagnoses:  Symptomatic anemia  Constipation, unspecified constipation type  NSTEMI (non-ST elevated myocardial infarction) [facility])    ED Discharge Orders    None     Patient had an acute stroke a month ago here for evaluation of new onset lethargy and low-grade fever.  History is limited as patient is nonverbal and has right-sided residual deficit.  Work-up initiated.  2:46 PM Patient had elevated troponin of 2.8. Last cath 02/2017 shows new mural thrombus.  current EKG shows T wave inversion in V2 which may be new.  He is a dialysis patient has had elevated troponin in the past, reflecting NSTEMI.  When asked if he has any chest pain, patient shook his head.  Due to his significant medical problems including cardiac disease, will consult cardiology.  3:23 PM Appreciate consultation from Cardiology who will see pt in the ER and will determine further care. Pt needing dialysis today  as he is due.  BUN 40, Cr 10.19, K+ 3.4. CXR without pleural effusion.    3:38 PM I spoke with nephrologist Dr. Eliott Nine, who request for me to consult Dr. Atlee Abide.  I have consulted Dr. Ronalee Belts who agrees to evaluate pt once he is admitted.   6:14 PM Head CT scan without new changes.  Cardiology have seen and evaluated patient and felt no further work-up on a cardiac standpoint is indicated.  Nephrology felt patient is stable on a nephrological standpoint and can be dialyzed on Monday.  Patient states that is now at bedside and  voiced concern for patient's recurrent abdominal pain which has been ongoing for the past week.  Will obtain abdominal pelvic CT scan for further evaluation.  We will also perform rectal examination to ensure no evidence of stool impaction  6:43 PM Successful manual disimpaction with removal of hard stools.  Hemoccult is positive for fecal blood test however stool is of normal appearance and no gross blood.  Patient does have a drop in his hemoglobin to 8.4.  It was in the tens several weeks prior.  Abdominal pelvis CT scan unremarkable, head CT scan without new changes.  Troponin value is trending down.  Given his generalized fatigue,  Drops in hemoglobin while been on blood thinner medication, and positive fecal occult blood test, I appreciate consultation from tried hospitalist, Dr. Antionette Char, who agrees to see and admit patient for further management of this condition.  CRITICAL CARE Performed by: Fayrene Helper Total critical care time: 40 minutes Critical care time was exclusive of separately billable procedures and treating other patients. Critical care was necessary to treat or prevent imminent or life-threatening deterioration. Critical care was time spent personally by me on the following activities: development of treatment plan with patient and/or surrogate as well as nursing, discussions with consultants, evaluation of patient's response to treatment, examination of patient, obtaining history from patient or surrogate, ordering and performing treatments and interventions, ordering and review of laboratory studies, ordering and review of radiographic studies, pulse oximetry and re-evaluation of patient's condition.    Fayrene Helper, PA-C 08/07/17 [id number]    Loren Racer, MD 08/07/17 2214

## 2017-08-07 NOTE — ED Triage Notes (Signed)
Pt arrived via [facility] from [facility] after facility reported new onset lethargy with low grade fever. Facility reported vomiting yesterday. Pt is a dialysis pt, due for dialysis today. Temp 98.1 at triage, unsure of medications given PTA; nothing given by EMS. Pt is nonverbal with right sided residual weakness from previous stroke. Shunt in left arm. Pt was asleep at time of triage, awakens to verbal stimulation. Responds with "thumbs up" with left hand when questions with yes/no questions. EMS v/s 103/67, 58hr, 97%ra, cbg 126.

## 2017-08-07 NOTE — Consult Note (Signed)
[facility] Renal Consultation Note    Indication for Consultation:  Management of ESRD/hemodialysis; anemia, hypertension/volume and secondary hyperparathyroidism  ZOX:WRUE, [name], MD  HPI: [name] is a 57 y.o. male. ESRD 2/2 HTN on HD MWF at [facility], first starting on 11/08/13.  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 Of note patient has attended all dialysis sessions except today since discharge and underwent fistulogram and intervention of his LU AVF yesterday 5/23 at [facility] by Dr. Juel Burrow.   Seen and examined at bedside. Unable to provide accurate/reliable history due to patient cognitive impairment and aphasia. Shook head yes to answer most questions. Per Epic review patient brought to ED for new onset lethargy and low grade temp.  Pertinent findings in the ED included bradycardia, hypotension, K 3.4, troponin 2.8, Hgb 8.4 and CXR with no acute findings.    Past Medical History:  Diagnosis Date  . ACHALASIA   . ANEMIA-NOS   . CAD (coronary artery disease), 3 vessel significant disease.    . Cardiomyopathy (HCC) 05/12/2017  . CEREBROVASCULAR ACCIDENT, HX OF   . CHF (congestive heart failure) (HCC)   . CKD (chronic kidney disease) stage 4, GFR 15-29 ml/min (HCC)   . Constipation   . DEGENERATIVE JOINT DISEASE, SHOULDER    knee  . DEPRESSION    2006- not depressed any longer  . Family history of adverse reaction to anesthesia    Mother, hard to awaken  . GERD    no meds required  . Gout    takes Uloric daily  . H/O hiatal hernia   . Headache(784.0)   . Heart attack (HCC)   . History of colon polyps   . History of diastolic dysfunction, grade 2 by echo   . HYPERLIPIDEMIA    takes crestor daily  .  Hyperlipidemia   . HYPERTENSION    takes Norvasc,Catapress,Hydralazine,Imdur,and Metoprolol daily  . Impaired glucose tolerance   . Insomnia    takes Ambien nightly  . NSTEMI (non-ST elevated myocardial infarction), 06/30/11 07/01/2011  . RENAL CALCULUS, HX OF   . RENAL INSUFFICIENCY    12/2013- TThSat  . S/P coronary artery stent placement, PTCA/DES Resolute to mid-LAD via the LIMA graft, and PTCA of apical 95% stenosis 07/05/11 07/04/2011  . S/P PTCA (percutaneous transluminal coronary angioplasty), 06/30/11 07/01/2011  . Seizures (HCC)    INSULIN INDUCED  . Sleep apnea    SLEEP STUDY IN [place] , NO MACHINE YET  1 YEAR  . Stroke (HCC)    93/2005/2006/2007;left sided weakness.  1993 12/2013  . Thyroid disease    Past Surgical History:  Procedure Laterality Date  . ARCH AORTOGRAM N/A 02/19/2012   Procedure: ARCH AORTOGRAM;  Surgeon: Fransisco Hertz, MD;  Location: [facility] CATH LAB;  Service: Cardiovascular;  Laterality: N/A;  . AV FISTULA PLACEMENT  12/02/2011   Procedure: ARTERIOVENOUS (AV) FISTULA CREATION;  Surgeon: Fransisco Hertz, MD;  Location: [facility] OR;  Service: Vascular;  Laterality: Left;  Ultrasound Guided  . AV FISTULA PLACEMENT Left 06/08/2012   Procedure: ARTERIOVENOUS (AV) FISTULA CREATION;  Surgeon: Fransisco Hertz, MD;  Location: [facility] OR;  Service: Vascular;  Laterality: Left;  . BASCILIC VEIN TRANSPOSITION Left 09/06/2014   Procedure: FIRST STAGE BASILIC VEIN TRANSPOSITION ;  Surgeon: Fransisco Hertz, MD;  Location: [facility] OR;  Service: Vascular;  Laterality: Left;  . BASCILIC VEIN TRANSPOSITION Left 12/06/2014   Procedure: SECOND STAGE BRACHIAL VEIN TRANSPOSITION ;  Surgeon: Fransisco Hertz, MD;  Location: [facility] OR;  Service: Vascular;  Laterality: Left;  . CARDIAC CATHETERIZATION    . COLONOSCOPY    . CORONARY ANGIOPLASTY     06/30/11   AT [facility]  . CORONARY ARTERY BYPASS GRAFT  04/01/2011   Procedure: CORONARY ARTERY BYPASS GRAFTING (CABG);  Surgeon: Kathlee Nations Suann Larry, MD;  Location: [facility] OR;  Service:  Open Heart Surgery;  Laterality: N/A;  . HERNIA REPAIR  2013  . INSERTION OF DIALYSIS CATHETER  04/01/2011   Procedure: INSERTION OF DIALYSIS CATHETER;  Surgeon: Nilda Simmer, MD;  Location: [facility] OR;  Service: Vascular;  Laterality: N/A;  . IR ANGIO INTRA EXTRACRAN SEL COM CAROTID INNOMINATE UNI R MOD SED  07/06/2017  . IR ANGIO VERTEBRAL SEL VERTEBRAL BILAT MOD SED  07/06/2017  . IR CT HEAD LTD  07/06/2017  . IR PERCUTANEOUS ART THROMBECTOMY/INFUSION INTRACRANIAL INC DIAG ANGIO  07/06/2017  . KNEE SURGERY Right 01/07/2011   arthroscopy  . LEFT HEART CATH AND CORS/GRAFTS ANGIOGRAPHY N/A 03/05/2017   Procedure: LEFT HEART CATH AND CORS/GRAFTS ANGIOGRAPHY;  Surgeon: Runell Gess, MD;  Location: [facility] INVASIVE CV LAB;  Service: Cardiovascular;  Laterality: N/A;  . LEFT HEART CATHETERIZATION WITH CORONARY ANGIOGRAM N/A 03/28/2011   Procedure: LEFT HEART CATHETERIZATION WITH CORONARY ANGIOGRAM;  Surgeon: Lennette Bihari, MD;  Location: [facility] CATH LAB;  Service: Cardiovascular;  Laterality: N/A;  pending creatnine  . LEFT HEART CATHETERIZATION WITH CORONARY/GRAFT ANGIOGRAM N/A 06/30/2011   Procedure: LEFT HEART CATHETERIZATION WITH Isabel Caprice;  Surgeon: Thurmon Fair, MD;  Location: [facility] CATH LAB;  Service: Cardiovascular;  Laterality: N/A;  . LIGATION OF ARTERIOVENOUS  FISTULA Left 06/08/2012   Procedure: LIGATION OF ARTERIOVENOUS  FISTULA;  Surgeon: Fransisco Hertz, MD;  Location: [facility] OR;  Service: Vascular;  Laterality: Left;  . NM MYOCAR PERF EJECTION FRACTION  06/25/2011   There is evidence of mild ischemia in the basal inferolateral and mid inferolateral regions. (Extent 7 %) The post-stress ejection fraction is 44%. There is mild hypocontractility in the distal inferoapical segment. baseline T wave inversion is noted in leads I, avL, II, V2. This is a low risk scan. The present perfusion study suggests significant myocardial salvage with mild residual ischemia .  Marland Kitchen PERCUTANEOUS CORONARY  INTERVENTION-BALLOON ONLY  06/30/2011   Procedure: PERCUTANEOUS CORONARY INTERVENTION-BALLOON ONLY;  Surgeon: Thurmon Fair, MD;  Location: [facility] CATH LAB;  Service: Cardiovascular;;  . PERCUTANEOUS CORONARY STENT INTERVENTION (PCI-S) N/A 07/04/2011   Procedure: PERCUTANEOUS CORONARY STENT INTERVENTION (PCI-S);  Surgeon: Lennette Bihari, MD;  Location: [facility] CATH LAB;  Service: Cardiovascular;  Laterality: N/A;  . RADIOLOGY WITH ANESTHESIA N/A 07/05/2017   Procedure: RADIOLOGY WITH ANESTHESIA;  Surgeon: Julieanne Cotton, MD;  Location: [facility] OR;  Service: Radiology;  Laterality: N/A;  . REVISION OF ARTERIOVENOUS GORETEX GRAFT Left 12/06/2014   Procedure: REVISION OF ARTERIOVENOUS GORETEX GRAFT USING 66mmx10cm;  Surgeon: Fransisco Hertz, MD;  Location: [facility] OR;  Service: Vascular;  Laterality: Left;  . STOMACH SURGERY  2009   achalasia  . SUBCLAVIAN STENT PLACEMENT  12/20/2011  . TONSILLECTOMY    . TONSILLECTOMY     Family History  Problem Relation Age of Onset  . Cancer Mother   . Heart disease Mother   . Hyperlipidemia Mother   . Hypertension  Mother   . Alzheimer's disease Mother   . Cancer Father   . Heart disease Father   . Hypertension Other   . Stroke Other   . Hyperlipidemia Other    Social History:  reports that he has never smoked. He quit smokeless tobacco use about 14 years ago. His smokeless tobacco use included chew. He reports that he drinks about 1.2 oz of alcohol per week. He reports that he does not use drugs. Allergies  Allergen Reactions  . Shrimp [Shellfish Allergy] Shortness Of Breath  . Atorvastatin Other (See Comments)    weakness  . Insulins Other (See Comments)    Patient unsure as to the kind of insulin but states that it has previously caused seizures.  Most recent hospitalization (Jan 2013) received insulin but did not have reaction. LIKELY DUE TO SIGNIFICANT HYPOGLYCEMIA  . Simvastatin Other (See Comments)     abnormal liver tests  . Ultram [Tramadol] Other (See  Comments)    Liver enzyme    Prior to Admission medications   Medication Sig Start Date End Date Taking? Authorizing Provider  acetaminophen (TYLENOL) 325 MG tablet Take 2 tablets (650 mg total) by mouth every 4 (four) hours as needed for mild pain (or temp > 37.5 C (99.5 F)). 07/17/17 08/16/17  Nyanor, Karie Soda, NP  amLODipine (NORVASC) 2.5 MG tablet Take 1 tablet (2.5 mg total) by mouth daily. 07/17/17   Nyanor, Karie Soda, NP  aspirin 81 MG chewable tablet Chew 1 tablet (81 mg total) by mouth daily. 07/17/17   Nyanor, Karie Soda, NP  aspirin EC 81 MG EC tablet Take 1 tablet (81 mg total) by mouth daily. 07/07/11   Dwana Melena, PA-C  atorvastatin (LIPITOR) 80 MG tablet Take 1 tablet (80 mg total) by mouth daily. 07/17/17   Nyanor, Karie Soda, NP  cinacalcet (SENSIPAR) 30 MG tablet Take 3 tablets (90 mg total) by mouth daily with supper. 07/17/17   Nyanor, Karie Soda, NP  doxercalciferol (HECTOROL) 4 MCG/2ML injection Inject 2.5 mLs (5 mcg total) into the vein every Monday, Wednesday, and Friday with hemodialysis. 03/06/17   Lonia Blood, MD  febuxostat (ULORIC) 40 MG tablet Take 1 tablet (40 mg total) by mouth daily. 07/17/17   Nyanor, Karie Soda, NP  ferric gluconate 125 mg in sodium chloride 0.9 % 100 mL Inject 125 mg into the vein every Monday, Wednesday, and Friday with hemodialysis. 03/06/17   Lonia Blood, MD  hydrALAZINE (APRESOLINE) 10 MG tablet Take 1 tablet (10 mg total) by mouth every 8 (eight) hours. 07/17/17   Nyanor, Karie Soda, NP  ketoconazole (NIZORAL) 2 % cream Apply 1 application topically daily. 12/18/16   Vivi Barrack, DPM  lanthanum ([name]) 1000 MG chewable tablet Chew 2 tablets (2,000 mg total) by mouth 3 (three) times daily with meals. Yearly physical due in July must see MD for refills 09/10/15   Corwin Levins, MD  memantine (NAMENDA) 10 MG tablet Take 1 tablet (10 mg total) by mouth 2 (two) times daily. 07/17/17   Nyanor, Karie Soda, NP  metoprolol tartrate (LOPRESSOR) 25 MG tablet Take 1  tablet (25 mg total) by mouth 2 (two) times daily. 07/17/17   Nyanor, Karie Soda, NP  nitroGLYCERIN (NITROSTAT) 0.4 MG SL tablet PLACE 1 TABLET UNDER TONGUE AS NEEDED FOR CHEST PAIN EVERY 5 MINUTES (MAX 3 DOSES) 05/12/17   Corwin Levins, MD  Nutritional Supplements (FEEDING SUPPLEMENT, NEPRO CARB STEADY,) LIQD Take 237 mLs by mouth 2 (  two) times daily between meals. 07/17/17   Nyanor, Karie Soda, NP  terazosin (HYTRIN) 10 MG capsule TAKE 1 CAPSULE BY MOUTH DAILY AT BEDTIME 05/12/17   Corwin Levins, MD  warfarin (COUMADIN) 5 MG tablet Take 1 tablet (5 mg total) by mouth daily at 6 PM. 07/17/17 07/17/18  Nyanor, Karie Soda, NP  zolpidem (AMBIEN) 10 MG tablet Take 1 tablet (10 mg total) by mouth at bedtime as needed. for sleep 05/12/17   Corwin Levins, MD   No current facility-administered medications for this encounter.    Current Outpatient Medications  Medication Sig Dispense Refill  . acetaminophen (TYLENOL) 325 MG tablet Take 2 tablets (650 mg total) by mouth every 4 (four) hours as needed for mild pain (or temp > 37.5 C (99.5 F)). 30 tablet 0  . amLODipine (NORVASC) 2.5 MG tablet Take 1 tablet (2.5 mg total) by mouth daily. 30 tablet 0  . aspirin 81 MG chewable tablet Chew 1 tablet (81 mg total) by mouth daily. 30 tablet 0  . aspirin EC 81 MG EC tablet Take 1 tablet (81 mg total) by mouth daily.    Marland Kitchen atorvastatin (LIPITOR) 80 MG tablet Take 1 tablet (80 mg total) by mouth daily. 30 tablet 0  . cinacalcet (SENSIPAR) 30 MG tablet Take 3 tablets (90 mg total) by mouth daily with supper. 60 tablet 0  . doxercalciferol (HECTOROL) 4 MCG/2ML injection Inject 2.5 mLs (5 mcg total) into the vein every Monday, Wednesday, and Friday with hemodialysis. 2 mL   . febuxostat (ULORIC) 40 MG tablet Take 1 tablet (40 mg total) by mouth daily. 30 tablet 0  . ferric gluconate 125 mg in sodium chloride 0.9 % 100 mL Inject 125 mg into the vein every Monday, Wednesday, and Friday with hemodialysis.    . hydrALAZINE (APRESOLINE) 10 MG  tablet Take 1 tablet (10 mg total) by mouth every 8 (eight) hours. 90 tablet 0  . ketoconazole (NIZORAL) 2 % cream Apply 1 application topically daily. 60 g 2  . lanthanum ([name]) 1000 MG chewable tablet Chew 2 tablets (2,000 mg total) by mouth 3 (three) times daily with meals. Yearly physical due in July must see MD for refills 540 tablet 0  . memantine (NAMENDA) 10 MG tablet Take 1 tablet (10 mg total) by mouth 2 (two) times daily. 180 tablet 3  . metoprolol tartrate (LOPRESSOR) 25 MG tablet Take 1 tablet (25 mg total) by mouth 2 (two) times daily. 60 tablet 0  . nitroGLYCERIN (NITROSTAT) 0.4 MG SL tablet PLACE 1 TABLET UNDER TONGUE AS NEEDED FOR CHEST PAIN EVERY 5 MINUTES (MAX 3 DOSES) 25 tablet 4  . Nutritional Supplements (FEEDING SUPPLEMENT, NEPRO CARB STEADY,) LIQD Take 237 mLs by mouth 2 (two) times daily between meals. 60 Can 0  . terazosin (HYTRIN) 10 MG capsule TAKE 1 CAPSULE BY MOUTH DAILY AT BEDTIME 90 capsule 3  . warfarin (COUMADIN) 5 MG tablet Take 1 tablet (5 mg total) by mouth daily at 6 PM. 30 tablet 11  . zolpidem (AMBIEN) 10 MG tablet Take 1 tablet (10 mg total) by mouth at bedtime as needed. for sleep 90 tablet 1   Labs: Basic Metabolic Panel: Recent Labs  Lab 08/07/17 1406  NA 138  K 3.4*  CL 96*  CO2 31  GLUCOSE 95  BUN 40*  CREATININE 10.19*  CALCIUM 9.1   Liver Function Tests: No results for input(s): AST, ALT, ALKPHOS, BILITOT, PROT, ALBUMIN in the last 168 hours. No results  for input(s): LIPASE, AMYLASE in the last 168 hours. No results for input(s): AMMONIA in the last 168 hours. CBC: Recent Labs  Lab 08/07/17 1406  WBC 12.4*  HGB 8.4*  HCT 26.0*  MCV 94.9  PLT 310   Cardiac Enzymes: No results for input(s): CKTOTAL, CKMB, CKMBINDEX, TROPONINI in the last 168 hours. CBG: No results for input(s): GLUCAP in the last 168 hours. Iron Studies: No results for input(s): IRON, TIBC, TRANSFERRIN, FERRITIN in the last 72 hours. Studies/Results: Dg  Chest Portable 1 View  Result Date: 08/07/2017 CLINICAL DATA:  Fatigue. EXAM: PORTABLE CHEST 1 VIEW COMPARISON:  Radiograph of July 06, 2017. FINDINGS: Stable cardiomediastinal silhouette. Status post coronary artery bypass graft. No pneumothorax or pleural effusion is noted. Both lungs are clear. The visualized skeletal structures are unremarkable. IMPRESSION: No acute cardiopulmonary abnormality seen. Electronically Signed   By: Lupita Raider, M.D.   On: 08/07/2017 14:25    ROS: All others negative except those listed in HPI.  General: No weight loss, fever, chills  HEENT: No recent headaches, nasal bleeding,  visual changes, sore throat or dysphagia Neurologic: No dizziness, blackouts, seizures. No recent symptoms of stroke or mini- stroke. No recent episodes of slurred speech, or temporary blindness.  Cardiac: No recent episodes of chest pain/pressure,  shortness of breath at rest or DOE.  Vascular: No history of rest pain in feet, claudication, nonhealing ulcers  or DVT  Pulmonary: No home oxygen,no cough, hemoptysis, or wheezing  Musculoskeletal: no arthritis, low back pain, or joint pain  Hematologic:No history of hypercoagulable state. No history of easy bleeding. No history of anemia  Gastrointestinal: No hematochezia or melena, No gastroesophageal reflux, no trouble swallowing  Urinary: Anuric or makes small amount of urine, no nurning with urination or frequency   Skin: No rashes or lesions Psychological: No  anxiety or depression   Physical Exam: Vitals:   08/07/17 1145 08/07/17 1445 08/07/17 1500 08/07/17 1546  BP: 107/74 114/63 120/69   Pulse: (!) 52 (!) 48 (!) 50   Resp:  11 12   Temp:    97.8 F (36.6 C)  TempSrc:    Rectal  SpO2: 100% 100% 100%      General: thin male, NAD Head: NCAT sclera not icteric MMM Neck: Supple. No lymphadenopathy Lungs: CTA bilaterally. No wheeze, rales or rhonchi. Breathing is unlabored. Heart: RRR. No murmur, rubs or gallops.   Abdomen: soft, nontender, +BS, no guarding, no rebound tenderness Lower extremities:no edema, ischemic changes, or open wounds  Neuro: Alert, R hemiparesis, expressive aphasia, nods in answer to questions Dialysis Access:RU AVF +b/t  Dialysis Orders:  MWF [place]  4.5hrs, BFR 400, DFR 800,  EDW 88.5kg, 2K/ 2.5Ca  Access: LU AVF  Heparin 4000 Unit bolus, intermittent 2000 Unit mid run Mircera 75 mcg q2wks - last 5/22  Assessment/Plan: 1.  Lethargy/low grade temp - CT ordered. Afebrile in ED, WBC mildly elevated. Lactic acid WNL. CXR unremarkable. per primary 2.  Elevated troponin w/Hx CAD - cardio consulted 3.  ESRD -  MWF patient. Non emergent. Will write orders for HD in the AM if admitted. 4K bath d/t hypokalemia. 4.  Hypotension/volume  - BP soft. Appears euvolemic on exam. CXR unremarkable. Plan for minimal UF removal.  5.  Anemia of CKD - Hgb 8.4, ESA just dosed follow. 6.  Secondary Hyperparathyroidism -  Ca 9.1. No VDRA as OP. Continue binders and sensipar if admitted. 7.  Nutrition - Renal diet with fluid restrictions.  8. Hx multiple CVA w/residual effects - per primary 9. Hx RV thrombus - on coumadin  Virgina Norfolk, PA-C [facility] Pager: [phone] 08/07/2017, 4:12 PM

## 2017-08-07 NOTE — H&P (Signed)
History and Physical    [name] VQQ:[id number] DOB: 1960/12/06 DOA: 08/07/2017  PCP: Corwin Levins, MD   Patient coming from: SNF   Chief Complaint: Lethargy, N/V   HPI: [name] is a 57 y.o. male with medical history significant for coronary artery disease status post CABG, chronic combined systolic and diastolic CHF, end-stage renal disease on hemodialysis, and recent CVA with right-sided weakness, now presenting to the emergency department from his SNF with 2 days of lethargy and nausea with vomiting.  Patient was discharged from the hospital 3 weeks ago after management for acute CVA that resulted in significant motor deficits.  He had been doing fairly well until he was noted to be more lethargic over the past 2 days and had some vomiting as well.  It is unclear whether there is any blood in the vomitus, though the patient denies that.  He denies chest pain and denies abdominal pain or diarrhea.  Family reports that he had been constipated.  ED Course: Upon arrival to the ED, patient is found to be afebrile, saturating well on room air, and with vitals otherwise stable.  EKG features a sinus rhythm with LAD and LVH with repolarization abnormality.  Chest x-ray is negative for acute cardiopulmonary disease, noncontrast head CT is negative for acute intracranial abnormality, and CT abdomen and pelvis is negative for acute intra-abdominal or pelvic pathology.  Chemistry panel is notable for a slight hypokalemia, BUN 40, and creatinine 10.19.  CBC features a leukocytosis to 12,400 and a normocytic anemia with hemoglobin of 8.4, down from recent priors in the 10-12 range.  Fecal occult blood testing is positive but there is no melena or gross blood on DRE.  Lactic acid is reassuringly normal.  INR is therapeutic at 2.62.  Troponin is elevated to 2.80, second troponin 2.67.  Cardiology evaluated the patient in the ED and recommends continued medical management.  Nephrology was also  consulted by the ED physician and will arrange for inpatient dialysis as needed.  Patient remains hemodynamically stable, in no apparent respiratory distress, and will be admitted for ongoing evaluation and management of lethargy with nausea, vomiting, and acute on chronic anemia with occult GI bleed.  Review of Systems:  All other systems reviewed and apart from HPI, are negative.  Past Medical History:  Diagnosis Date  . ACHALASIA   . ANEMIA-NOS   . CAD (coronary artery disease), 3 vessel significant disease.    . Cardiomyopathy (HCC) 05/12/2017  . CEREBROVASCULAR ACCIDENT, HX OF   . CHF (congestive heart failure) (HCC)   . CKD (chronic kidney disease) stage 4, GFR 15-29 ml/min (HCC)   . Constipation   . DEGENERATIVE JOINT DISEASE, SHOULDER    knee  . DEPRESSION    2006- not depressed any longer  . Family history of adverse reaction to anesthesia    Mother, hard to awaken  . GERD    no meds required  . Gout    takes Uloric daily  . H/O hiatal hernia   . Headache(784.0)   . Heart attack (HCC)   . History of colon polyps   . History of diastolic dysfunction, grade 2 by echo   . HYPERLIPIDEMIA    takes crestor daily  . Hyperlipidemia   . HYPERTENSION    takes Norvasc,Catapress,Hydralazine,Imdur,and Metoprolol daily  . Impaired glucose tolerance   . Insomnia    takes Ambien nightly  . NSTEMI (non-ST elevated myocardial infarction), 06/30/11 07/01/2011  . RENAL CALCULUS, HX OF   .  RENAL INSUFFICIENCY    12/2013- TThSat  . S/P coronary artery stent placement, PTCA/DES Resolute to mid-LAD via the LIMA graft, and PTCA of apical 95% stenosis 07/05/11 07/04/2011  . S/P PTCA (percutaneous transluminal coronary angioplasty), 06/30/11 07/01/2011  . Seizures (HCC)    INSULIN INDUCED  . Sleep apnea    SLEEP STUDY IN [place] , NO MACHINE YET  1 YEAR  . Stroke (HCC)    93/2005/2006/2007;left sided weakness.  1993 12/2013  . Thyroid disease     Past Surgical History:  Procedure  Laterality Date  . ARCH AORTOGRAM N/A 02/19/2012   Procedure: ARCH AORTOGRAM;  Surgeon: Fransisco Hertz, MD;  Location: [facility] CATH LAB;  Service: Cardiovascular;  Laterality: N/A;  . AV FISTULA PLACEMENT  12/02/2011   Procedure: ARTERIOVENOUS (AV) FISTULA CREATION;  Surgeon: Fransisco Hertz, MD;  Location: [facility] OR;  Service: Vascular;  Laterality: Left;  Ultrasound Guided  . AV FISTULA PLACEMENT Left 06/08/2012   Procedure: ARTERIOVENOUS (AV) FISTULA CREATION;  Surgeon: Fransisco Hertz, MD;  Location: [facility] OR;  Service: Vascular;  Laterality: Left;  . BASCILIC VEIN TRANSPOSITION Left 09/06/2014   Procedure: FIRST STAGE BASILIC VEIN TRANSPOSITION ;  Surgeon: Fransisco Hertz, MD;  Location: [facility] OR;  Service: Vascular;  Laterality: Left;  . BASCILIC VEIN TRANSPOSITION Left 12/06/2014   Procedure: SECOND STAGE BRACHIAL VEIN TRANSPOSITION ;  Surgeon: Fransisco Hertz, MD;  Location: [facility] OR;  Service: Vascular;  Laterality: Left;  . CARDIAC CATHETERIZATION    . COLONOSCOPY    . CORONARY ANGIOPLASTY     06/30/11   AT [facility]  . CORONARY ARTERY BYPASS GRAFT  04/01/2011   Procedure: CORONARY ARTERY BYPASS GRAFTING (CABG);  Surgeon: Kathlee Nations Suann Larry, MD;  Location: [facility] OR;  Service: Open Heart Surgery;  Laterality: N/A;  . HERNIA REPAIR  2013  . INSERTION OF DIALYSIS CATHETER  04/01/2011   Procedure: INSERTION OF DIALYSIS CATHETER;  Surgeon: Nilda Simmer, MD;  Location: [facility] OR;  Service: Vascular;  Laterality: N/A;  . IR ANGIO INTRA EXTRACRAN SEL COM CAROTID INNOMINATE UNI R MOD SED  07/06/2017  . IR ANGIO VERTEBRAL SEL VERTEBRAL BILAT MOD SED  07/06/2017  . IR CT HEAD LTD  07/06/2017  . IR PERCUTANEOUS ART THROMBECTOMY/INFUSION INTRACRANIAL INC DIAG ANGIO  07/06/2017  . KNEE SURGERY Right 01/07/2011   arthroscopy  . LEFT HEART CATH AND CORS/GRAFTS ANGIOGRAPHY N/A 03/05/2017   Procedure: LEFT HEART CATH AND CORS/GRAFTS ANGIOGRAPHY;  Surgeon: Runell Gess, MD;  Location: [facility] INVASIVE CV LAB;  Service: Cardiovascular;  Laterality:  N/A;  . LEFT HEART CATHETERIZATION WITH CORONARY ANGIOGRAM N/A 03/28/2011   Procedure: LEFT HEART CATHETERIZATION WITH CORONARY ANGIOGRAM;  Surgeon: Lennette Bihari, MD;  Location: [facility] CATH LAB;  Service: Cardiovascular;  Laterality: N/A;  pending creatnine  . LEFT HEART CATHETERIZATION WITH CORONARY/GRAFT ANGIOGRAM N/A 06/30/2011   Procedure: LEFT HEART CATHETERIZATION WITH Isabel Caprice;  Surgeon: Thurmon Fair, MD;  Location: [facility] CATH LAB;  Service: Cardiovascular;  Laterality: N/A;  . LIGATION OF ARTERIOVENOUS  FISTULA Left 06/08/2012   Procedure: LIGATION OF ARTERIOVENOUS  FISTULA;  Surgeon: Fransisco Hertz, MD;  Location: [facility] OR;  Service: Vascular;  Laterality: Left;  . NM MYOCAR PERF EJECTION FRACTION  06/25/2011   There is evidence of mild ischemia in the basal inferolateral and mid inferolateral regions. (Extent 7 %) The post-stress ejection fraction is 44%. There is mild hypocontractility in the distal inferoapical segment. baseline T wave inversion is noted in leads  I, avL, II, V2. This is a low risk scan. The present perfusion study suggests significant myocardial salvage with mild residual ischemia .  Marland Kitchen PERCUTANEOUS CORONARY INTERVENTION-BALLOON ONLY  06/30/2011   Procedure: PERCUTANEOUS CORONARY INTERVENTION-BALLOON ONLY;  Surgeon: Thurmon Fair, MD;  Location: [facility] CATH LAB;  Service: Cardiovascular;;  . PERCUTANEOUS CORONARY STENT INTERVENTION (PCI-S) N/A 07/04/2011   Procedure: PERCUTANEOUS CORONARY STENT INTERVENTION (PCI-S);  Surgeon: Lennette Bihari, MD;  Location: [facility] CATH LAB;  Service: Cardiovascular;  Laterality: N/A;  . RADIOLOGY WITH ANESTHESIA N/A 07/05/2017   Procedure: RADIOLOGY WITH ANESTHESIA;  Surgeon: Julieanne Cotton, MD;  Location: [facility] OR;  Service: Radiology;  Laterality: N/A;  . REVISION OF ARTERIOVENOUS GORETEX GRAFT Left 12/06/2014   Procedure: REVISION OF ARTERIOVENOUS GORETEX GRAFT USING 47mmx10cm;  Surgeon: Fransisco Hertz, MD;  Location: [facility] OR;  Service: Vascular;   Laterality: Left;  . STOMACH SURGERY  2009   achalasia  . SUBCLAVIAN STENT PLACEMENT  12/20/2011  . TONSILLECTOMY    . TONSILLECTOMY       reports that he has never smoked. He quit smokeless tobacco use about 14 years ago. His smokeless tobacco use included chew. He reports that he drinks about 1.2 oz of alcohol per week. He reports that he does not use drugs.  Allergies  Allergen Reactions  . Shrimp [Shellfish Allergy] Shortness Of Breath  . Atorvastatin Other (See Comments)    weakness  . Insulins Other (See Comments)    Patient unsure as to the kind of insulin but states that it has previously caused seizures.  Most recent hospitalization (Jan 2013) received insulin but did not have reaction. LIKELY DUE TO SIGNIFICANT HYPOGLYCEMIA  . Simvastatin Other (See Comments)     abnormal liver tests  . Ultram [Tramadol] Other (See Comments)    Liver enzyme     Family History  Problem Relation Age of Onset  . Cancer Mother   . Heart disease Mother   . Hyperlipidemia Mother   . Hypertension Mother   . Alzheimer's disease Mother   . Cancer Father   . Heart disease Father   . Hypertension Other   . Stroke Other   . Hyperlipidemia Other      Prior to Admission medications   Medication Sig Start Date End Date Taking? Authorizing Provider  acetaminophen (TYLENOL) 325 MG tablet Take 2 tablets (650 mg total) by mouth every 4 (four) hours as needed for mild pain (or temp > 37.5 C (99.5 F)). 07/17/17 08/16/17  Nyanor, Karie Soda, NP  amLODipine (NORVASC) 2.5 MG tablet Take 1 tablet (2.5 mg total) by mouth daily. 07/17/17   Nyanor, Karie Soda, NP  aspirin 81 MG chewable tablet Chew 1 tablet (81 mg total) by mouth daily. 07/17/17   Nyanor, Karie Soda, NP  aspirin EC 81 MG EC tablet Take 1 tablet (81 mg total) by mouth daily. 07/07/11   Dwana Melena, PA-C  atorvastatin (LIPITOR) 80 MG tablet Take 1 tablet (80 mg total) by mouth daily. 07/17/17   Nyanor, Karie Soda, NP  cinacalcet (SENSIPAR) 30 MG tablet Take 3  tablets (90 mg total) by mouth daily with supper. 07/17/17   Nyanor, Karie Soda, NP  doxercalciferol (HECTOROL) 4 MCG/2ML injection Inject 2.5 mLs (5 mcg total) into the vein every Monday, Wednesday, and Friday with hemodialysis. 03/06/17   Lonia Blood, MD  febuxostat (ULORIC) 40 MG tablet Take 1 tablet (40 mg total) by mouth daily. 07/17/17   Nyanor, Karie Soda, NP  ferric gluconate  125 mg in sodium chloride 0.9 % 100 mL Inject 125 mg into the vein every Monday, Wednesday, and Friday with hemodialysis. 03/06/17   Lonia Blood, MD  hydrALAZINE (APRESOLINE) 10 MG tablet Take 1 tablet (10 mg total) by mouth every 8 (eight) hours. 07/17/17   Nyanor, Karie Soda, NP  ketoconazole (NIZORAL) 2 % cream Apply 1 application topically daily. 12/18/16   Vivi Barrack, DPM  lanthanum ([name]) 1000 MG chewable tablet Chew 2 tablets (2,000 mg total) by mouth 3 (three) times daily with meals. Yearly physical due in July must see MD for refills 09/10/15   Corwin Levins, MD  memantine (NAMENDA) 10 MG tablet Take 1 tablet (10 mg total) by mouth 2 (two) times daily. 07/17/17   Nyanor, Karie Soda, NP  metoprolol tartrate (LOPRESSOR) 25 MG tablet Take 1 tablet (25 mg total) by mouth 2 (two) times daily. 07/17/17   Nyanor, Karie Soda, NP  nitroGLYCERIN (NITROSTAT) 0.4 MG SL tablet PLACE 1 TABLET UNDER TONGUE AS NEEDED FOR CHEST PAIN EVERY 5 MINUTES (MAX 3 DOSES) 05/12/17   Corwin Levins, MD  Nutritional Supplements (FEEDING SUPPLEMENT, NEPRO CARB STEADY,) LIQD Take 237 mLs by mouth 2 (two) times daily between meals. 07/17/17   Nyanor, Karie Soda, NP  terazosin (HYTRIN) 10 MG capsule TAKE 1 CAPSULE BY MOUTH DAILY AT BEDTIME 05/12/17   Corwin Levins, MD  warfarin (COUMADIN) 5 MG tablet Take 1 tablet (5 mg total) by mouth daily at 6 PM. 07/17/17 07/17/18  Nyanor, Karie Soda, NP  zolpidem (AMBIEN) 10 MG tablet Take 1 tablet (10 mg total) by mouth at bedtime as needed. for sleep 05/12/17   Corwin Levins, MD    Physical Exam: Vitals:   08/07/17  1830 08/07/17 1900 08/07/17 1945 08/07/17 2119  BP: (!) 106/55 122/72 123/74 135/72  Pulse: (!) 59 (!) 50 (!) 51 (!) 47  Resp: 13 12 16 16   Temp:      TempSrc:      SpO2: 100% 100% 100% 100%      Constitutional: NAD, calm  Eyes: PERTLA, lids and conjunctivae normal ENMT: Mucous membranes are moist. Posterior pharynx clear of any exudate or lesions.   Neck: normal, supple, no masses, no thyromegaly Respiratory: clear to auscultation bilaterally, no wheezing, no crackles. Normal respiratory effort.    Cardiovascular: S1 & S2 heard, regular rate and rhythm. No extremity edema. No significant JVD. Abdomen: No distension, no tenderness, soft. Bowel sounds normal.  Musculoskeletal: no clubbing / cyanosis. No joint deformity upper and lower extremities.    Skin: no significant rashes, lesions, ulcers. Warm, dry, well-perfused. Neurologic: No gross facial asymmetry. Lethargic but easily roused. Right-sided weakness.   Psychiatric: lethargic, easily roused and oriented to person, place, and situation. Pleasant, cooperative.     Labs on Admission: I have personally reviewed following labs and imaging studies  CBC: Recent Labs  Lab 08/07/17 1406  WBC 12.4*  HGB 8.4*  HCT 26.0*  MCV 94.9  PLT 310   Basic Metabolic Panel: Recent Labs  Lab 08/07/17 1406  NA 138  K 3.4*  CL 96*  CO2 31  GLUCOSE 95  BUN 40*  CREATININE 10.19*  CALCIUM 9.1   GFR: CrCl cannot be calculated (Unknown ideal weight.). Liver Function Tests: No results for input(s): AST, ALT, ALKPHOS, BILITOT, PROT, ALBUMIN in the last 168 hours. No results for input(s): LIPASE, AMYLASE in the last 168 hours. No results for input(s): AMMONIA in the last 168 hours. Coagulation  Profile: Recent Labs  Lab 08/07/17 1641  INR 2.62   Cardiac Enzymes: No results for input(s): CKTOTAL, CKMB, CKMBINDEX, TROPONINI in the last 168 hours. BNP (last 3 results) No results for input(s): PROBNP in the last 8760  hours. HbA1C: No results for input(s): HGBA1C in the last 72 hours. CBG: No results for input(s): GLUCAP in the last 168 hours. Lipid Profile: No results for input(s): CHOL, HDL, LDLCALC, TRIG, CHOLHDL, LDLDIRECT in the last 72 hours. Thyroid Function Tests: No results for input(s): TSH, T4TOTAL, FREET4, T3FREE, THYROIDAB in the last 72 hours. Anemia Panel: No results for input(s): VITAMINB12, FOLATE, FERRITIN, TIBC, IRON, RETICCTPCT in the last 72 hours. Urine analysis:    Component Value Date/Time   COLORURINE YELLOW 12/30/2013 1003   APPEARANCEUR HAZY (A) 12/30/2013 1003   LABSPEC 1.017 12/30/2013 1003   PHURINE 8.5 (H) 12/30/2013 1003   GLUCOSEU NEGATIVE 12/30/2013 1003   GLUCOSEU NEGATIVE 11/10/2012 0954   HGBUR SMALL (A) 12/30/2013 1003   BILIRUBINUR NEGATIVE 12/30/2013 1003   KETONESUR NEGATIVE 12/30/2013 1003   PROTEINUR >300 (A) 12/30/2013 1003   UROBILINOGEN 0.2 12/30/2013 1003   NITRITE NEGATIVE 12/30/2013 1003   LEUKOCYTESUR MODERATE (A) 12/30/2013 1003   Sepsis Labs: @LABRCNTIP (procalcitonin:4,lacticidven:4) )No results found for this or any previous visit (from the past 240 hour(s)).   Radiological Exams on Admission: Ct Head Wo Contrast  Result Date: 08/07/2017 CLINICAL DATA:  57 y/o  M; altered mental status. EXAM: CT HEAD WITHOUT CONTRAST TECHNIQUE: Contiguous axial images were obtained from the base of the skull through the vertex without intravenous contrast. COMPARISON:  07/14/2017 CT head.  07/06/2017 MRI of the head. FINDINGS: Brain: Hypoattenuation is present within left posterior insula and left posterolateral frontal lobe cortex corresponding to the region of infarct on the prior 07/06/2017 MRI of the brain. Stable small chronic infarction within the right temporal lobe, chronic microvascular ischemic changes of the brain, and parenchymal volume loss. No acute infarct, hemorrhage, or mass effect identified. Vascular: Calcific atherosclerosis of carotid  siphons and vertebral arteries. No hyperdense vessel identified. Skull: Normal. Negative for fracture or focal lesion. Sinuses/Orbits: No acute finding. Other: None. IMPRESSION: 1. No acute intracranial abnormality identified. 2. Right temporal lobe and left posterior frontal lobe chronic infarctions. Mild chronic microvascular ischemic changes and parenchymal volume loss of the brain. These results were called by telephone at the time of interpretation on 08/07/2017 at 5:30 pm to Dr. Fayrene Helper , who verbally acknowledged these results. Electronically Signed   By: Mitzi Hansen M.D.   On: 08/07/2017 17:34   Ct Abdomen Pelvis W Contrast  Result Date: 08/07/2017 CLINICAL DATA:  57 y/o M; lethargy and low-grade fever. Vomiting yesterday. EXAM: CT ABDOMEN AND PELVIS WITH CONTRAST TECHNIQUE: Multidetector CT imaging of the abdomen and pelvis was performed using the standard protocol following bolus administration of intravenous contrast. CONTRAST:  OMNIPAQUE IOHEXOL 300 MG/ML  SOLN COMPARISON:  None. FINDINGS: Lower chest: No acute abnormality. Severe coronary artery calcific atherosclerosis. Hepatobiliary: Several subcentimeter lucencies are present within the liver, too small to characterize, but likely representing hepatic cysts. Punctate calcified granuloma in liver segment 8. No gallbladder wall thickening or pericholecystic fluid. No biliary ductal dilatation. Pancreas: Unremarkable. No pancreatic ductal dilatation or surrounding inflammatory changes. Spleen: Normal in size without focal abnormality. Adrenals/Urinary Tract: Normal adrenal glands. Atrophic kidneys with multiple subcentimeter cyst. No hydronephrosis. Normal bladder. Stomach/Bowel: Stomach is within normal limits. Appendix appears normal. No evidence of bowel wall thickening, distention, or inflammatory changes. Vascular/Lymphatic:  Aortic atherosclerosis. No enlarged abdominal or pelvic lymph nodes. Reproductive: Prostate is  unremarkable. Other: No abdominal wall hernia or abnormality. No abdominopelvic ascites. Musculoskeletal: No fracture is seen. IMPRESSION: 1. No acute process identified.  No findings of bowel obstruction. 2. Atrophic kidneys. 3. Aortic and coronary artery calcific atherosclerosis. Electronically Signed   By: Mitzi Hansen M.D.   On: 08/07/2017 20:28   Dg Chest Portable 1 View  Result Date: 08/07/2017 CLINICAL DATA:  Fatigue. EXAM: PORTABLE CHEST 1 VIEW COMPARISON:  Radiograph of July 06, 2017. FINDINGS: Stable cardiomediastinal silhouette. Status post coronary artery bypass graft. No pneumothorax or pleural effusion is noted. Both lungs are clear. The visualized skeletal structures are unremarkable. IMPRESSION: No acute cardiopulmonary abnormality seen. Electronically Signed   By: Lupita Raider, M.D.   On: 08/07/2017 14:25    EKG: Independently reviewed. Sinus rhythm, LAD, LVH with repolarization abnormality.   Assessment/Plan   1. Occult GI bleeding; normocytic anemia - Presents from SNF with 2 days of lethargy and N/V  - Found to have occult GIB with Hgb 8.4, down from recent priors in 10-12 range  - Hemodynamically stable, no gross blood, abdominal exam benign, CT abd/pelvis negative for acute findings   - Type and screen, hold warfarin, treat with daily PPI, follow H&H   2. Lethargy with nausea, vomiting  - Presents from SNF with lethargy and N/V x2 days  - CT abd/pelvis negative for acute findings, UA pending  - Possibly secondary to occult GIB    - Address GIB as above, continue supportive care with prn antiemetics   3. CAD; elevated troponin  - Presents with lethargy, N/V, and found to have troponin 2.80  - He denies pain, EKG appears unchanged, CXR unremarkable, and second troponin is decreased to 2.67  - Appreciate cardiology evaluation  - Continue cardiac monitoring, continue daily ASA, statin, and beta-blocker   4. ESRD  - Typically dialyzes MWF, missed session  on the day of admission  - Nephrology consulting and much appreciated  - No hyperkalemia, uremia, significant hypervolemia, or acidosis on admission  - SLIV, fluid-restrict diet    5. Chronic combined systolic/diastolic CHF  - Appears well-compensated  - Continue beta-blocker    6. OSA  - Continue CPAP qHS    7. LV thrombus  - Managed with warfarin, INR 2.62 on admission  - Warfarin held on admission in light of occult GIB with decreased H&H    8. Hx of CVA  - Recent history of debilitating CVA  - No new deficits, CT head negative for acute findings  - Continue ASA and statin    DVT prophylaxis: SCD's, warfarin prior to admission  Code Status: Full  Family Communication: Discussed with patient Consults called: Nephrology, cardiology Admission status: Inpatient    Briscoe Deutscher, MD [facility] Pager [phone]  If 7PM-7AM, please contact night-coverage [email or web address] Password [facility]  08/07/2017, 10:15 PM

## 2017-08-07 NOTE — Consult Note (Addendum)
Cardiology Consultation:   Patient ID: [name]; [id number]; 22-Apr-1960   Admit date: 08/07/2017 Date of Consult: 08/07/2017  Primary Care Provider: Corwin Levins, MD Primary Cardiologist: Nanetta Batty, MD    Patient Profile:   [name] is a 57 y.o. male with a hx of recent stroke complicated by NSTEMI who is being seen today for the evaluation of elevated Troponin at the request of Dr Ranae Palms.  History of Present Illness:   [name] is a 57 y.o. AA male with history of CAD s/p CABG in1/2013 followed by PTCA/stenting of LAD via LIMA in 06/2011. Cath done 03/05/17 for a NSTEMI showed 3/4 grafts patent with severe distal disease. Medical therapy recommended for diffuse disease. Other problems include ESRDon HD, HTN, HL, GERD and anemia. He was admitted 07/05/17 witha CVA with R sided hemiplegia. Underwent unsuccessful mechanical thrombolysis andfailed mechanical thrombectomy.Post stroke he had an episode of accelerated HTN then had elevated Troponin consistent withrecurrent NSTEMI. 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 The plan is for medical Rx. He has dense Rt hemiplegia and expressive aphasia. He was discharged to [facility] 07/17/17. He presents back to the ED today with a history of weakness and nausea and vomiting. The pt gives no clear history of chest pain and is lethargic but arousable. We were called secondary to an elevated Troponin of 2.8    Past Medical History:  Diagnosis Date  . ACHALASIA   . ANEMIA-NOS   . CAD (coronary artery disease), 3 vessel significant disease.    . Cardiomyopathy (HCC) 05/12/2017  . CEREBROVASCULAR ACCIDENT, HX OF   . CHF (congestive heart failure) (HCC)   . CKD (chronic kidney disease) stage 4, GFR 15-29 ml/min (HCC)   . Constipation   . DEGENERATIVE JOINT DISEASE, SHOULDER    knee  . DEPRESSION    2006- not depressed any longer  . Family history of adverse reaction to anesthesia    Mother, hard to awaken  . GERD    no meds required  . Gout    takes Uloric daily  . H/O hiatal hernia   . Headache(784.0)   . Heart attack (HCC)   . History of colon polyps   . History of diastolic dysfunction, grade 2 by echo   . HYPERLIPIDEMIA    takes crestor daily  . Hyperlipidemia   . HYPERTENSION    takes Norvasc,Catapress,Hydralazine,Imdur,and Metoprolol daily  . Impaired glucose tolerance   . Insomnia    takes Ambien nightly  . NSTEMI (non-ST elevated myocardial infarction), 06/30/11 07/01/2011  . RENAL CALCULUS, HX OF   . RENAL INSUFFICIENCY    12/2013- TThSat  . S/P coronary artery stent placement, PTCA/DES Resolute to mid-LAD via the LIMA graft, and PTCA of apical 95% stenosis 07/05/11 07/04/2011  . S/P PTCA (percutaneous transluminal coronary angioplasty), 06/30/11 07/01/2011  . Seizures (HCC)    INSULIN INDUCED  . Sleep apnea    SLEEP STUDY IN [place] , NO MACHINE YET  1 YEAR  . Stroke (HCC)    93/2005/2006/2007;left sided weakness.  1993 12/2013  . Thyroid disease     Past Surgical History:  Procedure Laterality Date  . ARCH AORTOGRAM N/A 02/19/2012   Procedure: ARCH AORTOGRAM;  Surgeon: Fransisco Hertz, MD;  Location: [facility] CATH LAB;  Service: Cardiovascular;  Laterality: N/A;  . AV FISTULA PLACEMENT  12/02/2011   Procedure: ARTERIOVENOUS (AV) FISTULA CREATION;  Surgeon: Fransisco Hertz, MD;  Location: [facility] OR;  Service: Vascular;  Laterality:  Left;  Ultrasound Guided  . AV FISTULA PLACEMENT Left 06/08/2012   Procedure: ARTERIOVENOUS (AV) FISTULA CREATION;  Surgeon: Fransisco Hertz, MD;  Location: [facility] OR;  Service: Vascular;  Laterality: Left;  . BASCILIC VEIN TRANSPOSITION Left 09/06/2014   Procedure: FIRST STAGE BASILIC VEIN TRANSPOSITION ;  Surgeon: Fransisco Hertz, MD;  Location: [facility] OR;  Service: Vascular;  Laterality: Left;  . BASCILIC VEIN TRANSPOSITION Left 12/06/2014   Procedure: SECOND STAGE BRACHIAL VEIN TRANSPOSITION ;  Surgeon: Fransisco Hertz, MD;  Location: [facility] OR;  Service: Vascular;  Laterality: Left;  .  CARDIAC CATHETERIZATION    . COLONOSCOPY    . CORONARY ANGIOPLASTY     06/30/11   AT [facility]  . CORONARY ARTERY BYPASS GRAFT  04/01/2011   Procedure: CORONARY ARTERY BYPASS GRAFTING (CABG);  Surgeon: Kathlee Nations Suann Larry, MD;  Location: [facility] OR;  Service: Open Heart Surgery;  Laterality: N/A;  . HERNIA REPAIR  2013  . INSERTION OF DIALYSIS CATHETER  04/01/2011   Procedure: INSERTION OF DIALYSIS CATHETER;  Surgeon: Nilda Simmer, MD;  Location[facility] OR;  Service: Vascular;  Laterality: N/A;  . IR ANGIO INTRA EXTRACRAN SEL COM CAROTID INNOMINATE UNI R MOD SED  07/06/2017  . IR ANGIO VERTEBRAL SEL VERTEBRAL BILAT MOD SED  07/06/2017  . IR CT HEAD LTD  07/06/2017  . IR PERCUTANEOUS ART THROMBECTOMY/INFUSION INTRACRANIAL INC DIAG ANGIO  07/06/2017  . KNEE SURGERY Right 01/07/2011   arthroscopy  . LEFT HEART CATH AND CORS/GRAFTS ANGIOGRAPHY N/A 03/05/2017   Procedure: LEFT HEART CATH AND CORS/GRAFTS ANGIOGRAPHY;  Surgeon: Runell Gess, MD;  Location: [facility] INVASIVE CV LAB;  Service: Cardiovascular;  Laterality: N/A;  . LEFT HEART CATHETERIZATION WITH CORONARY ANGIOGRAM N/A 03/28/2011   Procedure: LEFT HEART CATHETERIZATION WITH CORONARY ANGIOGRAM;  Surgeon: Lennette Bihari, MD;  Location: [facility] CATH LAB;  Service: Cardiovascular;  Laterality: N/A;  pending creatnine  . LEFT HEART CATHETERIZATION WITH CORONARY/GRAFT ANGIOGRAM N/A 06/30/2011   Procedure: LEFT HEART CATHETERIZATION WITH Isabel Caprice;  Surgeon: Thurmon Fair, MD;  Location: [facility] CATH LAB;  Service: Cardiovascular;  Laterality: N/A;  . LIGATION OF ARTERIOVENOUS  FISTULA Left 06/08/2012   Procedure: LIGATION OF ARTERIOVENOUS  FISTULA;  Surgeon: Fransisco Hertz, MD;  Location: [facility] OR;  Service: Vascular;  Laterality: Left;  . NM MYOCAR PERF EJECTION FRACTION  06/25/2011   There is evidence of mild ischemia in the basal inferolateral and mid inferolateral regions. (Extent 7 %) The post-stress ejection fraction is 44%. There is mild hypocontractility  in the distal inferoapical segment. baseline T wave inversion is noted in leads I, avL, II, V2. This is a low risk scan. The present perfusion study suggests significant myocardial salvage with mild residual ischemia .  Marland Kitchen PERCUTANEOUS CORONARY INTERVENTION-BALLOON ONLY  06/30/2011   Procedure: PERCUTANEOUS CORONARY INTERVENTION-BALLOON ONLY;  Surgeon: Thurmon Fair, MD;  Location: [facility] CATH LAB;  Service: Cardiovascular;;  . PERCUTANEOUS CORONARY STENT INTERVENTION (PCI-S) N/A 07/04/2011   Procedure: PERCUTANEOUS CORONARY STENT INTERVENTION (PCI-S);  Surgeon: Lennette Bihari, MD;  Location: [facility] CATH LAB;  Service: Cardiovascular;  Laterality: N/A;  . RADIOLOGY WITH ANESTHESIA N/A 07/05/2017   Procedure: RADIOLOGY WITH ANESTHESIA;  Surgeon: Julieanne Cotton, MD;  Location: [facility] OR;  Service: Radiology;  Laterality: N/A;  . REVISION OF ARTERIOVENOUS GORETEX GRAFT Left 12/06/2014   Procedure: REVISION OF ARTERIOVENOUS GORETEX GRAFT USING 37mmx10cm;  Surgeon: Fransisco Hertz, MD;  Location: [facility] OR;  Service: Vascular;  Laterality: Left;  . STOMACH SURGERY  2009   achalasia  . SUBCLAVIAN STENT PLACEMENT  12/20/2011  . TONSILLECTOMY    . TONSILLECTOMY       Home Medications:  Prior to Admission medications   Medication Sig Start Date End Date Taking? Authorizing Provider  acetaminophen (TYLENOL) 325 MG tablet Take 2 tablets (650 mg total) by mouth every 4 (four) hours as needed for mild pain (or temp > 37.5 C (99.5 F)). 07/17/17 08/16/17  Nyanor, Karie Soda, NP  amLODipine (NORVASC) 2.5 MG tablet Take 1 tablet (2.5 mg total) by mouth daily. 07/17/17   Nyanor, Karie Soda, NP  aspirin 81 MG chewable tablet Chew 1 tablet (81 mg total) by mouth daily. 07/17/17   Nyanor, Karie Soda, NP  aspirin EC 81 MG EC tablet Take 1 tablet (81 mg total) by mouth daily. 07/07/11   Dwana Melena, PA-C  atorvastatin (LIPITOR) 80 MG tablet Take 1 tablet (80 mg total) by mouth daily. 07/17/17   Nyanor, Karie Soda, NP  cinacalcet (SENSIPAR) 30 MG tablet  Take 3 tablets (90 mg total) by mouth daily with supper. 07/17/17   Nyanor, Karie Soda, NP  doxercalciferol (HECTOROL) 4 MCG/2ML injection Inject 2.5 mLs (5 mcg total) into the vein every Monday, Wednesday, and Friday with hemodialysis. 03/06/17   Lonia Blood, MD  febuxostat (ULORIC) 40 MG tablet Take 1 tablet (40 mg total) by mouth daily. 07/17/17   Nyanor, Karie Soda, NP  ferric gluconate 125 mg in sodium chloride 0.9 % 100 mL Inject 125 mg into the vein every Monday, Wednesday, and Friday with hemodialysis. 03/06/17   Lonia Blood, MD  hydrALAZINE (APRESOLINE) 10 MG tablet Take 1 tablet (10 mg total) by mouth every 8 (eight) hours. 07/17/17   Nyanor, Karie Soda, NP  ketoconazole (NIZORAL) 2 % cream Apply 1 application topically daily. 12/18/16   Vivi Barrack, DPM  lanthanum ([name]) 1000 MG chewable tablet Chew 2 tablets (2,000 mg total) by mouth 3 (three) times daily with meals. Yearly physical due in July must see MD for refills 09/10/15   Corwin Levins, MD  memantine (NAMENDA) 10 MG tablet Take 1 tablet (10 mg total) by mouth 2 (two) times daily. 07/17/17   Nyanor, Karie Soda, NP  metoprolol tartrate (LOPRESSOR) 25 MG tablet Take 1 tablet (25 mg total) by mouth 2 (two) times daily. 07/17/17   Nyanor, Karie Soda, NP  nitroGLYCERIN (NITROSTAT) 0.4 MG SL tablet PLACE 1 TABLET UNDER TONGUE AS NEEDED FOR CHEST PAIN EVERY 5 MINUTES (MAX 3 DOSES) 05/12/17   Corwin Levins, MD  Nutritional Supplements (FEEDING SUPPLEMENT, NEPRO CARB STEADY,) LIQD Take 237 mLs by mouth 2 (two) times daily between meals. 07/17/17   Nyanor, Karie Soda, NP  terazosin (HYTRIN) 10 MG capsule TAKE 1 CAPSULE BY MOUTH DAILY AT BEDTIME 05/12/17   Corwin Levins, MD  warfarin (COUMADIN) 5 MG tablet Take 1 tablet (5 mg total) by mouth daily at 6 PM. 07/17/17 07/17/18  Nyanor, Karie Soda, NP  zolpidem (AMBIEN) 10 MG tablet Take 1 tablet (10 mg total) by mouth at bedtime as needed. for sleep 05/12/17   Corwin Levins, MD    Inpatient  Medications: Scheduled Meds:  Continuous Infusions:  PRN Meds:   Allergies:    Allergies  Allergen Reactions  . Shrimp [Shellfish Allergy] Shortness Of Breath  . Atorvastatin Other (See Comments)    weakness  . Insulins Other (See Comments)    Patient unsure as to the kind of insulin but states  that it has previously caused seizures.  Most recent hospitalization (Jan 2013) received insulin but did not have reaction. LIKELY DUE TO SIGNIFICANT HYPOGLYCEMIA  . Simvastatin Other (See Comments)     abnormal liver tests  . Ultram [Tramadol] Other (See Comments)    Liver enzyme     Social History:   Social History   Socioeconomic History  . Marital status: Divorced    Spouse name: Not on file  . Number of children: 1  . Years of education: Bachelors  . Highest education level: Not on file  Occupational History  . Occupation: Disabled  Social Needs  . Financial resource strain: Not on file  . Food insecurity:    Worry: Not on file    Inability: Not on file  . Transportation needs:    Medical: Not on file    Non-medical: Not on file  Tobacco Use  . Smoking status: Never Smoker  . Smokeless tobacco: Former Neurosurgeon    Types: Chew  . Tobacco comment: chewed tobacco   Substance and Sexual Activity  . Alcohol use: Yes    Alcohol/week: 1.2 oz    Types: 2 Shots of liquor per week    Comment: rarely; beer  . Drug use: No  . Sexual activity: Not Currently    Partners: Male  Lifestyle  . Physical activity:    Days per week: Not on file    Minutes per session: Not on file  . Stress: Not on file  Relationships  . Social connections:    Talks on phone: Not on file    Gets together: Not on file    Attends religious service: Not on file    Active member of club or organization: Not on file    Attends meetings of clubs or organizations: Not on file    Relationship status: Not on file  . Intimate partner violence:    Fear of current or ex partner: Not on file    Emotionally  abused: Not on file    Physically abused: Not on file    Forced sexual activity: Not on file  Other Topics Concern  . Not on file  Social History Narrative   Lives at home alone.   Right-handed.   Occasional caffeine use.    Family History:    Family History  Problem Relation Age of Onset  . Cancer Mother   . Heart disease Mother   . Hyperlipidemia Mother   . Hypertension Mother   . Alzheimer's disease Mother   . Cancer Father   . Heart disease Father   . Hypertension Other   . Stroke Other   . Hyperlipidemia Other      ROS:  Please see the history of present illness.  All other ROS reviewed and negative.     Physical Exam/Data:   Vitals:   08/07/17 1145 08/07/17 1445 08/07/17 1500 08/07/17 1546  BP: 107/74 114/63 120/69   Pulse: (!) 52 (!) 48 (!) 50   Resp:  11 12   Temp:    97.8 F (36.6 C)  TempSrc:    Rectal  SpO2: 100% 100% 100%    No intake or output data in the 24 hours ending 08/07/17 1552 There were no vitals filed for this visit. There is no height or weight on file to calculate BMI.  General:  Well nourished, well developed, in no acute distress HEENT: normal Lymph: no adenopathy Neck: no JVD Endocrine:  No thryomegaly Vascular: No carotid bruits; FA  pulses 2+ bilaterally without bruits  Cardiac:  normal S1, S2; RRR; no murmur  Lungs:  clear to auscultation bilaterally, no wheezing, rhonchi or rales  Abd: soft, nontender, no hepatomegaly  Ext: no edema Musculoskeletal:  No deformities, BUE and BLE strength normal and equal Skin: warm and dry  Neuro:  Rt hemi[aresis, expressive aphasia Psych:  lethargic  EKG:  The EKG was personally reviewed and demonstrates:  SB, AL TWI -similar to prior EKG 4/25  Relevant CV Studies: Echo 07/09/17- Study Conclusions  - Procedure narrative: Limited study for LV function. - Left ventricle: The cavity size was normal. Wall thickness was   increased in a pattern of mild LVH. Systolic function was mildly    to moderately reduced. The estimated ejection fraction was in the   range of 40% to 45%. Filling defect noted at the inferior apex   with Definity contrast - likely represents mural thrombus. The   study is not technically sufficient to allow evaluation of LV   diastolic function.  Impressions:  - Compared to a prior study on 07/06/2017, the LVEF is unchanged at   40-45% with apical wall motion abnormality and filing defect,   likely representing mural thrombus.   Laboratory Data:  Chemistry Recent Labs  Lab 08/07/17 1406  NA 138  K 3.4*  CL 96*  CO2 31  GLUCOSE 95  BUN 40*  CREATININE 10.19*  CALCIUM 9.1  GFRNONAA 5*  GFRAA 6*  ANIONGAP 11    No results for input(s): PROT, ALBUMIN, AST, ALT, ALKPHOS, BILITOT in the last 168 hours. Hematology Recent Labs  Lab 08/07/17 1406  WBC 12.4*  RBC 2.74*  HGB 8.4*  HCT 26.0*  MCV 94.9  MCH 30.7  MCHC 32.3  RDW 13.5  PLT 310   Cardiac EnzymesNo results for input(s): TROPONINI in the last 168 hours.  Recent Labs  Lab 08/07/17 1416  TROPIPOC 2.80*    BNPNo results for input(s): BNP, PROBNP in the last 168 hours.  DDimer No results for input(s): DDIMER in the last 168 hours.  Radiology/Studies:  Dg Chest Portable 1 View  Result Date: 08/07/2017 CLINICAL DATA:  Fatigue. EXAM: PORTABLE CHEST 1 VIEW COMPARISON:  Radiograph of July 06, 2017. FINDINGS: Stable cardiomediastinal silhouette. Status post coronary artery bypass graft. No pneumothorax or pleural effusion is noted. Both lungs are clear. The visualized skeletal structures are unremarkable. IMPRESSION: No acute cardiopulmonary abnormality seen. Electronically Signed   By: Lupita Raider, M.D.   On: 08/07/2017 14:25    Assessment and Plan:   Elevated Troponin- Conservative Rx- will follow  NSTEMI- S/p NSTEMI -Troponin 11 on 4/25- medical Rx  CAD- CABG 2013, subsequent PCI's, last cath Dec 2018- medical Rx  S/P CVA- Dense Lt brain CVA  07/05/17  ICM- EF 40-45% with LVT-Coumadin Rx  Anemia- Hgb 8.3  ESRD- HD patient  Plan: Cycle enzymes, continue Coumadin, no plans for cath. Decrease Lopressor to 12.5 mg BID secondary to bradycardia.   For questions or updates, please contact [facility] Please consult [email or web address] for contact info under Cardiology/STEMI.   Signed, Corine Shelter, PA-C  08/07/2017 3:52 PM   Agree with note written by Corine Shelter PAC  Pt's H & P reviewed. Well known to me. I cathed him 12/18 revealing patent grafts with distal disease. Med Rx recommended. Other probs as outlined. Unfortunately he has suffered a disabling CVA in the interim. He is on HD. He came in in April and had a trop of 11. Med  Rx was recommended. Today he is coming in because of weakness and he has a mildly elevated trop. EKG shows no acute changes. On coumadin AC for LV thrombus. I don't think any further w/u of this is necessary. Cont med Rx. Will be avail for further questions.  Nanetta Batty 08/07/2017 4:28 PM

## 2017-08-07 NOTE — ED Notes (Signed)
Critical Lab results reported to Dr.Yelverton.

## 2017-08-07 NOTE — ED Notes (Signed)
Attempted to draw lab and unable.

## 2017-08-08 ENCOUNTER — Other Ambulatory Visit: Payer: Self-pay

## 2017-08-08 DIAGNOSIS — R195 Other fecal abnormalities: Secondary | ICD-10-CM

## 2017-08-08 LAB — CBC
HCT: 27.4 % — ABNORMAL LOW (ref 39.0–52.0)
Hemoglobin: 8.8 g/dL — ABNORMAL LOW (ref 13.0–17.0)
MCH: 31.2 pg (ref 26.0–34.0)
MCHC: 32.1 g/dL (ref 30.0–36.0)
MCV: 97.2 fL (ref 78.0–100.0)
Platelets: 309 K/uL (ref 150–400)
RBC: 2.82 MIL/uL — ABNORMAL LOW (ref 4.22–5.81)
RDW: 13.6 % (ref 11.5–15.5)
WBC: 10.2 K/uL (ref 4.0–10.5)

## 2017-08-08 LAB — BASIC METABOLIC PANEL WITH GFR
Anion gap: 11 (ref 5–15)
BUN: 48 mg/dL — ABNORMAL HIGH (ref 6–20)
CO2: 29 mmol/L (ref 22–32)
Calcium: 8.9 mg/dL (ref 8.9–10.3)
Chloride: 96 mmol/L — ABNORMAL LOW (ref 101–111)
Creatinine, Ser: 11.54 mg/dL — ABNORMAL HIGH (ref 0.61–1.24)
GFR calc Af Amer: 5 mL/min — ABNORMAL LOW
GFR calc non Af Amer: 4 mL/min — ABNORMAL LOW
Glucose, Bld: 77 mg/dL (ref 65–99)
Potassium: 3.8 mmol/L (ref 3.5–5.1)
Sodium: 136 mmol/L (ref 135–145)

## 2017-08-08 LAB — PROTIME-INR
INR: 2.56
Prothrombin Time: 27.3 s — ABNORMAL HIGH (ref 11.4–15.2)

## 2017-08-08 LAB — MRSA PCR SCREENING: MRSA BY PCR: NEGATIVE

## 2017-08-08 LAB — GLUCOSE, CAPILLARY: Glucose-Capillary: 77 mg/dL (ref 65–99)

## 2017-08-08 MED ORDER — LIDOCAINE-PRILOCAINE 2.5-2.5 % EX CREA: 1.0000 "application " | TOPICAL_CREAM | CUTANEOUS | Status: DC | PRN

## 2017-08-08 MED ORDER — PENTAFLUOROPROP-TETRAFLUOROETH EX AERO: 1.0000 "application " | INHALATION_SPRAY | CUTANEOUS | Status: DC | PRN

## 2017-08-08 MED ORDER — LIDOCAINE HCL (PF) 1 % IJ SOLN: 5.0000 mL | INTRAMUSCULAR | Status: DC | PRN

## 2017-08-08 MED ORDER — SODIUM CHLORIDE 0.9 % IV SOLN: 100.0000 mL | INTRAVENOUS | Status: DC | PRN

## 2017-08-08 MED ORDER — HEPARIN SODIUM (PORCINE) 1000 UNIT/ML DIALYSIS
1000.0000 [IU] | INTRAMUSCULAR | Status: DC | PRN
  Filled 2017-08-08: qty 1

## 2017-08-08 MED ORDER — CHLORHEXIDINE GLUCONATE 0.12 % MT SOLN
15.0000 mL | Freq: Two times a day (BID) | OROMUCOSAL | Status: DC
  Administered 2017-08-09 (×2): 15 mL via OROMUCOSAL
  Filled 2017-08-08 (×2): qty 15

## 2017-08-08 MED ORDER — ORAL CARE MOUTH RINSE
15.0000 mL | Freq: Two times a day (BID) | OROMUCOSAL | Status: DC
  Administered 2017-08-09 (×2): 15 mL via OROMUCOSAL

## 2017-08-08 MED ORDER — ALTEPLASE 2 MG IJ SOLR: 2.0000 mg | Freq: Once | INTRAMUSCULAR | Status: DC | PRN

## 2017-08-08 NOTE — Procedures (Signed)
Patient was seen on dialysis and the procedure was supervised.  BFR 400  Via AVF BP is 120/79. 4K bath,  Looks more alert today.   Patient appears to be tolerating treatment well  [name] Jaynie Collins 08/08/2017

## 2017-08-08 NOTE — Progress Notes (Signed)
Unable to complete pt admission database, pt is not alert and oriented. No family at bedside.

## 2017-08-08 NOTE — Progress Notes (Signed)
Pt family at bedside requesting update from MD  Paged MD

## 2017-08-08 NOTE — Progress Notes (Signed)
Paged MD regarding lab stating they cannot draw blood cultures from peripheral IV per order  Awaiting call back

## 2017-08-08 NOTE — Progress Notes (Signed)
Received pt from HD, alert , wants to eat lunch.

## 2017-08-08 NOTE — Progress Notes (Signed)
Patient Demographics:    [name], is a 57 y.o. male, DOB - 06-Mar-1961, ZOX:[id number]  Admit date - 08/07/2017   Admitting Physician Briscoe Deutscher, MD  Outpatient Primary MD for the patient is Corwin Levins, MD  LOS - 1   Chief Complaint  Patient presents with  . Fatigue        Subjective:    [name] today has no fevers, no further emesis,  No chest pain, a bit more awake after hemodialysis,  Assessment  & Plan :    Principal Problem:   Occult GI bleeding Active Problems:   CAD (coronary artery disease)   Elevated troponin   End stage renal disease on dialysis (HCC)   OSA (obstructive sleep apnea)   History of CVA (cerebrovascular accident)   Chronic combined systolic and diastolic congestive heart failure (HCC)   LV (left ventricular) mural thrombus following MI [facility])  Brief Summary:- [name] is a 57 y.o. male with medical history significant for coronary artery disease status post CABG, chronic combined systolic and diastolic CHF, end-stage renal disease on hemodialysis, and recent CVA with right-sided weakness admitted 08/07/2016 with recurrent emesis and increased lethargy   Plan:- 1)Mental Status change/lethargy-suspect acute metabolic encephalopathy, appears to be improving slightly after hemodialysis, no evidence of acute infection at this time, blood sugars have been borderline low, last A1c was 4.6, check Accu-Cheks to avoid significant/symptomatic hypoglycemia.  Leukocytosis has resolved patient remains afebrile  2)Emesis with possible GI bleed--hemoglobin down below 9 from previous level of above 10, continue to hold warfarin, use PPI, check serial H&H consider GI consult if H&H continues to drop.  Hemoglobin dropped to 8.8 from 8.4  3)ESRD-tolerated hemodialysis well on 08/08/2017, his mentation actually improved post hemodialysis  4)Social/Ethics-plan of care  discussed with patient sister Meimie by phone, patient is a full code  5) elevated troponin in a patient with ESRD and history of CAD-monitor closely, EKG without acute changes, continue aspirin blocker and statins  6)h/o OSA-CPAP nightly  7)Recent CVA with right-sided hemiplegia--functional status unchanged, no new deficits, CT head on admission negative for for new acute findings to new aspirin and statin, and on hold  Code Status : full     Disposition Plan  : SNF  Consults  : Nephrology/cardiology   DVT Prophylaxis  : SCDs   Lab Results  Component Value Date   PLT 309 08/08/2017    Inpatient Medications  Scheduled Meds: . [START ON 08/09/2017] chlorhexidine  15 mL Mouth Rinse BID  . Chlorhexidine Gluconate Cloth  6 each Topical Q0600  . [START ON 08/09/2017] mouth rinse  15 mL Mouth Rinse BID  . [START ON 08/09/2017] mouth rinse  15 mL Mouth Rinse q12n4p  . pantoprazole  40 mg Intravenous Daily  . sodium chloride flush  3 mL Intravenous Q12H   Continuous Infusions: PRN Meds:.acetaminophen **OR** acetaminophen, HYDROcodone-acetaminophen, ondansetron **OR** ondansetron (ZOFRAN) IV    Anti-infectives (From admission, onward)   None        Objective:   Vitals:   08/08/17 1200 08/08/17 1230 08/08/17 1253 08/08/17 1433  BP: 119/74 113/78 (!) 118/58 (!) 141/83  Pulse: 61 (!) 59 60 67  Resp: 12 10 12 20   Temp:  97.6 F (36.4 C) (!) 97.4 F (36.3 C)  TempSrc:   Oral Oral  SpO2: 100% 100% 100% 99%  Weight:   86.2 kg (190 lb 0.6 oz)   Height:        Wt Readings from Last 3 Encounters:  08/08/17 86.2 kg (190 lb 0.6 oz)  07/17/17 89.1 kg (196 lb 6.9 oz)  05/12/17 94.3 kg (208 lb)     Intake/Output Summary (Last 24 hours) at 08/08/2017 1723 Last data filed at 08/08/2017 1500 Gross per 24 hour  Intake 240 ml  Output 1423 ml  Net -1183 ml     Physical Exam  Gen:- Awake Alert, able to follow commands not very verbal HEENT:- [place].AT, No sclera  icterus Neck-Supple Neck,No JVD,.  Lungs-  CTAB , good air movement CV- S1, S2 normal, CABG Abd-  +ve B.Sounds, Abd Soft, No tenderness,    Extremity/Skin:-Left arm AV fistula site with positive thrill and bruit Psych-affect is appropriate, oriented x3 Neuro-right-sided hemiplegia is not new this time (due to recent stroke),    Data Review:   Micro Results Recent Results (from the past 240 hour(s))  MRSA PCR Screening     Status: None   Collection Time: 08/07/17 11:51 PM  Result Value Ref Range Status   MRSA by PCR NEGATIVE NEGATIVE Final    Comment:        The GeneXpert MRSA Assay (FDA approved for NASAL specimens only), is one component of a comprehensive MRSA colonization surveillance program. It is not intended to diagnose MRSA infection nor to guide or monitor treatment for MRSA infections. Performed at [facility], [address] [place], [place] [address]     Radiology Reports Ct Head Wo Contrast  Result Date: 08/07/2017 CLINICAL DATA:  57 y/o  M; altered mental status. EXAM: CT HEAD WITHOUT CONTRAST TECHNIQUE: Contiguous axial images were obtained from the base of the skull through the vertex without intravenous contrast. COMPARISON:  07/14/2017 CT head.  07/06/2017 MRI of the head. FINDINGS: Brain: Hypoattenuation is present within left posterior insula and left posterolateral frontal lobe cortex corresponding to the region of infarct on the prior 07/06/2017 MRI of the brain. Stable small chronic infarction within the right temporal lobe, chronic microvascular ischemic changes of the brain, and parenchymal volume loss. No acute infarct, hemorrhage, or mass effect identified. Vascular: Calcific atherosclerosis of carotid siphons and vertebral arteries. No hyperdense vessel identified. Skull: Normal. Negative for fracture or focal lesion. Sinuses/Orbits: No acute finding. Other: None. IMPRESSION: 1. No acute intracranial abnormality identified. 2. Right temporal lobe  and left posterior frontal lobe chronic infarctions. Mild chronic microvascular ischemic changes and parenchymal volume loss of the brain. These results were called by telephone at the time of interpretation on 08/07/2017 at 5:30 pm to Dr. Fayrene Helper , who verbally acknowledged these results. Electronically Signed   By: Mitzi Hansen M.D.   On: 08/07/2017 17:34   Ct Head Wo Contrast  Result Date: 07/14/2017 CLINICAL DATA:  Follow-up examination for stroke. EXAM: CT HEAD WITHOUT CONTRAST TECHNIQUE: Contiguous axial images were obtained from the base of the skull through the vertex without intravenous contrast. COMPARISON:  Prior CT from 07/08/2017. FINDINGS: Brain: There has been continued normal interval evolution of previously identified posterior left MCA territory infarct. Previously seen associated hypodensity is less apparent, with a few scattered areas of laminar necrosis now seen within the area of infarction. No evidence for hemorrhagic transformation or other complication. Underlying atrophy with chronic small vessel ischemic  disease again noted, stable. No new large vessel territory infarct. No acute intracranial hemorrhage. No mass lesion, midline shift or mass effect. No hydrocephalus. No extra-axial fluid collection. Vascular: No hyperdense vessel. Calcified atherosclerosis at the skull base. Skull: Scalp soft tissues and calvarium demonstrate no acute abnormality. Sinuses/Orbits: Globes and orbital soft tissues within normal limits. Paranasal sinuses are clear. No mastoid effusion. Other: None. IMPRESSION: 1. Continued normal expected interval evolution of left posterior MCA territory infarct with interval development of small volume laminar necrosis. No evidence for hemorrhagic transformation or other complication. 2. No other new acute intracranial abnormality. 3. Stable background cerebral atrophy with chronic small vessel ischemic changes. Electronically Signed   By: Rise Mu M.D.   On: 07/14/2017 02:38   Ct Abdomen Pelvis W Contrast  Result Date: 08/07/2017 CLINICAL DATA:  57 y/o M; lethargy and low-grade fever. Vomiting yesterday. EXAM: CT ABDOMEN AND PELVIS WITH CONTRAST TECHNIQUE: Multidetector CT imaging of the abdomen and pelvis was performed using the standard protocol following bolus administration of intravenous contrast. CONTRAST:  OMNIPAQUE IOHEXOL 300 MG/ML  SOLN COMPARISON:  None. FINDINGS: Lower chest: No acute abnormality. Severe coronary artery calcific atherosclerosis. Hepatobiliary: Several subcentimeter lucencies are present within the liver, too small to characterize, but likely representing hepatic cysts. Punctate calcified granuloma in liver segment 8. No gallbladder wall thickening or pericholecystic fluid. No biliary ductal dilatation. Pancreas: Unremarkable. No pancreatic ductal dilatation or surrounding inflammatory changes. Spleen: Normal in size without focal abnormality. Adrenals/Urinary Tract: Normal adrenal glands. Atrophic kidneys with multiple subcentimeter cyst. No hydronephrosis. Normal bladder. Stomach/Bowel: Stomach is within normal limits. Appendix appears normal. No evidence of bowel wall thickening, distention, or inflammatory changes. Vascular/Lymphatic: Aortic atherosclerosis. No enlarged abdominal or pelvic lymph nodes. Reproductive: Prostate is unremarkable. Other: No abdominal wall hernia or abnormality. No abdominopelvic ascites. Musculoskeletal: No fracture is seen. IMPRESSION: 1. No acute process identified.  No findings of bowel obstruction. 2. Atrophic kidneys. 3. Aortic and coronary artery calcific atherosclerosis. Electronically Signed   By: Mitzi Hansen M.D.   On: 08/07/2017 20:28   Dg Chest Portable 1 View  Result Date: 08/07/2017 CLINICAL DATA:  Fatigue. EXAM: PORTABLE CHEST 1 VIEW COMPARISON:  Radiograph of July 06, 2017. FINDINGS: Stable cardiomediastinal silhouette. Status post coronary artery  bypass graft. No pneumothorax or pleural effusion is noted. Both lungs are clear. The visualized skeletal structures are unremarkable. IMPRESSION: No acute cardiopulmonary abnormality seen. Electronically Signed   By: Lupita Raider, M.D.   On: 08/07/2017 14:25     CBC Recent Labs  Lab 08/07/17 1406 08/08/17 0353  WBC 12.4* 10.2  HGB 8.4* 8.8*  HCT 26.0* 27.4*  PLT 310 309  MCV 94.9 97.2  MCH 30.7 31.2  MCHC 32.3 32.1  RDW 13.5 13.6    Chemistries  Recent Labs  Lab 08/07/17 1406 08/08/17 0353  NA 138 136  K 3.4* 3.8  CL 96* 96*  CO2 31 29  GLUCOSE 95 77  BUN 40* 48*  CREATININE 10.19* 11.54*  CALCIUM 9.1 8.9   ------------------------------------------------------------------------------------------------------------------ No results for input(s): CHOL, HDL, LDLCALC, TRIG, CHOLHDL, LDLDIRECT in the last 72 hours.  Lab Results  Component Value Date   HGBA1C 4.6 (L) 07/06/2017   ------------------------------------------------------------------------------------------------------------------ No results for input(s): TSH, T4TOTAL, T3FREE, THYROIDAB in the last 72 hours.  Invalid input(s): FREET3 ------------------------------------------------------------------------------------------------------------------ No results for input(s): VITAMINB12, FOLATE, FERRITIN, TIBC, IRON, RETICCTPCT in the last 72 hours.  Coagulation profile Recent Labs  Lab 08/07/17 1641 08/08/17 0353  INR  2.62 2.56    No results for input(s): DDIMER in the last 72 hours.  Cardiac Enzymes No results for input(s): CKMB, TROPONINI, MYOGLOBIN in the last 168 hours.  Invalid input(s): CK ------------------------------------------------------------------------------------------------------------------    Component Value Date/Time   BNP 960.1 (H) 03/04/2017 1610     Shon Hale M.D on 08/08/2017 at 5:23 PM  Between 7am to 7pm - Pager - [phone]  After 7pm go to [email or web address] -  password [email or web address]  [facility] -  Office  [phone]   Voice Recognition Reubin Milan dictation system was used to create this note, attempts have been made to correct errors. Please contact the author with questions and/or clarifications.

## 2017-08-08 NOTE — Plan of Care (Signed)
  Problem: Clinical Measurements: Goal: Ability to maintain clinical measurements within normal limits will improve Outcome: Progressing   Problem: Activity: Goal: Risk for activity intolerance will decrease Outcome: Progressing   

## 2017-08-08 NOTE — Progress Notes (Signed)
Pt placed on CPAP per order. Settings are Auto max 20-min 5, with ffm. Pt states he does not use CPAP machine at home. Pt tolerating current settings. Advised pt to call for RT if any further assistance is needed.

## 2017-08-08 NOTE — Progress Notes (Signed)
MD aware and stated to draw blood cultures from NO existing lines. Lab aware and successfully drew cultures

## 2017-08-08 NOTE — Progress Notes (Signed)
Pt admitted to room. In no apparent distress. Pt nods and says yes or no appropriately. Pt oriented to room. When asked if he could complete his admission questions he said no. When asked if his sister could do them he said yes. Will have staff contact sister tomorrow if she does not come by. Pt denies any pain or discomfort. Call bell within reach and bed in lowest position. Will continue to monitor and treat per orders.

## 2017-08-08 NOTE — Progress Notes (Signed)
Completed pt admission database with family at bedside

## 2017-08-09 LAB — CBC
HCT: 28.9 % — ABNORMAL LOW (ref 39.0–52.0)
Hemoglobin: 9.3 g/dL — ABNORMAL LOW (ref 13.0–17.0)
MCH: 31.2 pg (ref 26.0–34.0)
MCHC: 32.2 g/dL (ref 30.0–36.0)
MCV: 97 fL (ref 78.0–100.0)
PLATELETS: 298 10*3/uL (ref 150–400)
RBC: 2.98 MIL/uL — AB (ref 4.22–5.81)
RDW: 13.9 % (ref 11.5–15.5)
WBC: 9 10*3/uL (ref 4.0–10.5)

## 2017-08-09 LAB — PROTIME-INR
INR: 1.56
PROTHROMBIN TIME: 18.6 s — AB (ref 11.4–15.2)

## 2017-08-09 LAB — GLUCOSE, CAPILLARY
GLUCOSE-CAPILLARY: 100 mg/dL — AB (ref 65–99)
GLUCOSE-CAPILLARY: 84 mg/dL (ref 65–99)
GLUCOSE-CAPILLARY: 85 mg/dL (ref 65–99)
GLUCOSE-CAPILLARY: 97 mg/dL (ref 65–99)

## 2017-08-09 MED ORDER — WARFARIN SODIUM 7.5 MG PO TABS
7.5000 mg | ORAL_TABLET | Freq: Once | ORAL | Status: AC
  Administered 2017-08-09: 7.5 mg via ORAL
  Filled 2017-08-09: qty 1

## 2017-08-09 MED ORDER — CHLORHEXIDINE GLUCONATE CLOTH 2 % EX PADS
6.0000 | MEDICATED_PAD | Freq: Every day | CUTANEOUS | Status: DC
  Administered 2017-08-10: 6 via TOPICAL

## 2017-08-09 MED ORDER — CINACALCET HCL 30 MG PO TABS
90.0000 mg | ORAL_TABLET | Freq: Every day | ORAL | Status: DC
Start: 2017-08-10 — End: 2017-08-10
  Administered 2017-08-10: 90 mg via ORAL
  Filled 2017-08-09: qty 3

## 2017-08-09 MED ORDER — WARFARIN - PHARMACIST DOSING INPATIENT: Freq: Every day | Status: DC

## 2017-08-09 MED ORDER — NEPRO/CARBSTEADY PO LIQD
237.0000 mL | Freq: Two times a day (BID) | ORAL | Status: DC
Start: 2017-08-09 — End: 2017-08-10
  Administered 2017-08-09: 237 mL via ORAL
  Filled 2017-08-09 (×4): qty 237

## 2017-08-09 MED ORDER — DOXERCALCIFEROL 4 MCG/2ML IV SOLN
5.0000 ug | INTRAVENOUS | Status: DC
  Administered 2017-08-10: 5 ug via INTRAVENOUS

## 2017-08-09 NOTE — Progress Notes (Signed)
ANTICOAGULATION CONSULT NOTE - Initial Consult  Pharmacy Consult for warfarin Indication: stroke  Allergies  Allergen Reactions  . Shrimp [Shellfish Allergy] Shortness Of Breath  . Atorvastatin Other (See Comments)    weakness  . Insulins Other (See Comments)    Patient unsure as to the kind of insulin but states that it has previously caused seizures.  Most recent hospitalization (Jan 2013) received insulin but did not have reaction. LIKELY DUE TO SIGNIFICANT HYPOGLYCEMIA  . Simvastatin Other (See Comments)     abnormal liver tests  . Ultram [Tramadol] Other (See Comments)    Liver enzyme     Patient Measurements: Height: 6\' 3"  (190.5 cm) Weight: 196 lb 13.9 oz (89.3 kg) IBW/kg (Calculated) : 84.5   Vital Signs: Temp: 98.9 F (37.2 C) (05/26 1159) Temp Source: Oral (05/26 1159) BP: 137/87 (05/26 1159) Pulse Rate: 78 (05/26 1159)  Labs: Recent Labs    08/07/17 1406 08/07/17 1641 08/08/17 0353 08/09/17 0439  HGB 8.4*  --  8.8* 9.3*  HCT 26.0*  --  27.4* 28.9*  PLT 310  --  309 298  LABPROT  --  27.8* 27.3*  --   INR  --  2.62 2.56  --   CREATININE 10.19*  --  11.54*  --     Assessment: Pt with recent CVA in April 2019. Pt is on warfarin prior to admission 5.5 mg po daily There was some concern of a GI bleed with emesis - this has been ruled out. Will resume warfarin tonight, last dose taken was on 5/23. INR is 1.5 today  Goal of Therapy:  INR 2-2.5 Monitor platelets by anticoagulation protocol: Yes   Plan:  -Warfarin 7.5 mg po x1, then plan to resume normal PTA dose -Daily INR  Baldemar Friday 08/09/2017,4:49 PM

## 2017-08-09 NOTE — Progress Notes (Signed)
Patient Demographics:    [name], is a 57 y.o. male, DOB - 30-Aug-1960, FAO:[id number]  Admit date - 08/07/2017   Admitting Physician Briscoe Deutscher, MD  Outpatient Primary MD for the patient is Corwin Levins, MD  LOS - 2   Chief Complaint  Patient presents with  . Fatigue        Subjective:    [name] today has no fevers, no further emesis,  No chest pain, eating okay, requires some help with eating, RN at bedside, following commands  Assessment  & Plan :    Principal Problem:   Occult GI bleeding Active Problems:   CAD (coronary artery disease)   Elevated troponin   End stage renal disease on dialysis (HCC)   OSA (obstructive sleep apnea)   History of CVA (cerebrovascular accident)   Chronic combined systolic and diastolic congestive heart failure (HCC)   LV (left ventricular) mural thrombus following MI [facility])  Brief Summary:- Gregory EWALT is a 57 y.o. male with medical history significant for coronary artery disease status post CABG, chronic combined systolic and diastolic CHF, end-stage renal disease on hemodialysis, and recent CVA with right-sided weakness admitted 08/07/2016 with recurrent emesis and increased lethargy.  Much less lethargic after hemodialysis on 08/08/2017, awake and following commands at this time   Plan:-  1)Mental Status change/lethargy-suspect acute metabolic encephalopathy, overall much improved after hemodialysis, no evidence of acute infection at this time, blood sugars have been borderline low, last A1c was 4.6, check Accu-Cheks to avoid significant/symptomatic hypoglycemia.  Leukocytosis has resolved patient remains afebrile  2)Emesis with possible GI bleed--no further emesis, hemoglobin stable, okay to restart warfarin, use PPI, check serial H&H consider GI consult if H&H continues to drop.    3)ESRD-tolerated hemodialysis well on 08/08/2017, his  mentation has improved post hemodialysis, for hemodialysis Mondays Wednesdays and Fridays, urologic consult appreciated  4)Social/Ethics-plan of care discussed with patient sister Meimie by phone, patient is a full code, patient sister will work with facility especially on patient's hemodialysis days to make sure he gets a snack to avoid hypoglycemia  5) elevated troponin in a patient with ESRD and history of CAD-monitor closely, EKG without acute changes, continue aspirin blocker and statins  6)h/o OSA-CPAP nightly  7)Recent CVA with right-sided hemiplegia--functional status unchanged, no new deficits, CT head on admission negative for for new acute findings to new aspirin and statin, and on hold    Code Status : full     Disposition Plan  : SNF  Consults  : Nephrology/cardiology   DVT Prophylaxis  : SCDs   Lab Results  Component Value Date   PLT 298 08/09/2017    Inpatient Medications  Scheduled Meds: . chlorhexidine  15 mL Mouth Rinse BID  . Chlorhexidine Gluconate Cloth  6 each Topical Q0600  . [START ON 08/10/2017] Chlorhexidine Gluconate Cloth  6 each Topical Q0600  . [START ON 08/10/2017] cinacalcet  90 mg Oral Q breakfast  . [START ON 08/10/2017] doxercalciferol  5 mcg Intravenous Q M,W,F-HD  . mouth rinse  15 mL Mouth Rinse BID  . mouth rinse  15 mL Mouth Rinse q12n4p  . pantoprazole  40 mg Intravenous Daily  . sodium chloride flush  3 mL Intravenous Q12H  Continuous Infusions: PRN Meds:.acetaminophen **OR** acetaminophen, HYDROcodone-acetaminophen, ondansetron **OR** ondansetron (ZOFRAN) IV    Anti-infectives (From admission, onward)   None        Objective:   Vitals:   08/08/17 2140 08/08/17 2210 08/09/17 0424 08/09/17 1159  BP: 130/75  133/88 137/87  Pulse: 68 70 71 78  Resp: 20 18 18 20   Temp: 99 F (37.2 C)  98.8 F (37.1 C) 98.9 F (37.2 C)  TempSrc: Oral  Oral Oral  SpO2: 100% 98% 100% 100%  Weight:   89.3 kg (196 lb 13.9 oz)   Height:         Wt Readings from Last 3 Encounters:  08/09/17 89.3 kg (196 lb 13.9 oz)  07/17/17 89.1 kg (196 lb 6.9 oz)  05/12/17 94.3 kg (208 lb)     Intake/Output Summary (Last 24 hours) at 08/09/2017 1559 Last data filed at 08/09/2017 1456 Gross per 24 hour  Intake 720 ml  Output -  Net 720 ml     Physical Exam  Gen:- Awake Alert, able to follow commands not very verbal HEENT:- [place].AT, No sclera icterus Neck-Supple Neck,No JVD,.  Lungs-  CTAB , good air movement CV- S1, S2 normal, CABG Abd-  +ve B.Sounds, Abd Soft, No tenderness,    Extremity/Skin:-Left arm AV fistula site with positive thrill and bruit Psych-affect is appropriate, oriented x3 Neuro-right-sided hemiplegia is not new this time (due to recent stroke),    Data Review:   Micro Results Recent Results (from the past 240 hour(s))  MRSA PCR Screening     Status: None   Collection Time: 08/07/17 11:51 PM  Result Value Ref Range Status   MRSA by PCR NEGATIVE NEGATIVE Final    Comment:        The GeneXpert MRSA Assay (FDA approved for NASAL specimens only), is one component of a comprehensive MRSA colonization surveillance program. It is not intended to diagnose MRSA infection nor to guide or monitor treatment for MRSA infections. Performed at [facility], [address] [place], [place] [address]   Culture, blood (Routine X 2) w Reflex to ID Panel     Status: None (Preliminary result)   Collection Time: 08/08/17  6:15 PM  Result Value Ref Range Status   Specimen Description BLOOD RIGHT HAND  Final   Special Requests   Final    BOTTLES DRAWN AEROBIC AND ANAEROBIC Blood Culture adequate volume   Culture   Final    NO GROWTH < 24 HOURS Performed at [facility], [address], [place], [place] [address]    Report Status PENDING  Incomplete  Culture, blood (Routine X 2) w Reflex to ID Panel     Status: None (Preliminary result)   Collection Time: 08/08/17  6:20 PM  Result Value Ref Range Status    Specimen Description BLOOD RIGHT WRIST  Final   Special Requests   Final    BOTTLES DRAWN AEROBIC ONLY Blood Culture adequate volume   Culture   Final    NO GROWTH < 24 HOURS Performed at [facility], [address], [place], [place] [address]    Report Status PENDING  Incomplete    Radiology Reports Ct Head Wo Contrast  Result Date: 08/07/2017 CLINICAL DATA:  57 y/o  M; altered mental status. EXAM: CT HEAD WITHOUT CONTRAST TECHNIQUE: Contiguous axial images were obtained from the base of the skull through the vertex without intravenous contrast. COMPARISON:  07/14/2017 CT head.  07/06/2017 MRI  of the head. FINDINGS: Brain: Hypoattenuation is present within left posterior insula and left posterolateral frontal lobe cortex corresponding to the region of infarct on the prior 07/06/2017 MRI of the brain. Stable small chronic infarction within the right temporal lobe, chronic microvascular ischemic changes of the brain, and parenchymal volume loss. No acute infarct, hemorrhage, or mass effect identified. Vascular: Calcific atherosclerosis of carotid siphons and vertebral arteries. No hyperdense vessel identified. Skull: Normal. Negative for fracture or focal lesion. Sinuses/Orbits: No acute finding. Other: None. IMPRESSION: 1. No acute intracranial abnormality identified. 2. Right temporal lobe and left posterior frontal lobe chronic infarctions. Mild chronic microvascular ischemic changes and parenchymal volume loss of the brain. These results were called by telephone at the time of interpretation on 08/07/2017 at 5:30 pm to Dr. Fayrene Helper , who verbally acknowledged these results. Electronically Signed   By: Mitzi Hansen M.D.   On: 08/07/2017 17:34   Ct Head Wo Contrast  Result Date: 07/14/2017 CLINICAL DATA:  Follow-up examination for stroke. EXAM: CT HEAD WITHOUT CONTRAST TECHNIQUE: Contiguous axial images were obtained from the base of the skull through the vertex without  intravenous contrast. COMPARISON:  Prior CT from 07/08/2017. FINDINGS: Brain: There has been continued normal interval evolution of previously identified posterior left MCA territory infarct. Previously seen associated hypodensity is less apparent, with a few scattered areas of laminar necrosis now seen within the area of infarction. No evidence for hemorrhagic transformation or other complication. Underlying atrophy with chronic small vessel ischemic disease again noted, stable. No new large vessel territory infarct. No acute intracranial hemorrhage. No mass lesion, midline shift or mass effect. No hydrocephalus. No extra-axial fluid collection. Vascular: No hyperdense vessel. Calcified atherosclerosis at the skull base. Skull: Scalp soft tissues and calvarium demonstrate no acute abnormality. Sinuses/Orbits: Globes and orbital soft tissues within normal limits. Paranasal sinuses are clear. No mastoid effusion. Other: None. IMPRESSION: 1. Continued normal expected interval evolution of left posterior MCA territory infarct with interval development of small volume laminar necrosis. No evidence for hemorrhagic transformation or other complication. 2. No other new acute intracranial abnormality. 3. Stable background cerebral atrophy with chronic small vessel ischemic changes. Electronically Signed   By: Rise Mu M.D.   On: 07/14/2017 02:38   Ct Abdomen Pelvis W Contrast  Result Date: 08/07/2017 CLINICAL DATA:  57 y/o M; lethargy and low-grade fever. Vomiting yesterday. EXAM: CT ABDOMEN AND PELVIS WITH CONTRAST TECHNIQUE: Multidetector CT imaging of the abdomen and pelvis was performed using the standard protocol following bolus administration of intravenous contrast. CONTRAST:  OMNIPAQUE IOHEXOL 300 MG/ML  SOLN COMPARISON:  None. FINDINGS: Lower chest: No acute abnormality. Severe coronary artery calcific atherosclerosis. Hepatobiliary: Several subcentimeter lucencies are present within the  liver, too small to characterize, but likely representing hepatic cysts. Punctate calcified granuloma in liver segment 8. No gallbladder wall thickening or pericholecystic fluid. No biliary ductal dilatation. Pancreas: Unremarkable. No pancreatic ductal dilatation or surrounding inflammatory changes. Spleen: Normal in size without focal abnormality. Adrenals/Urinary Tract: Normal adrenal glands. Atrophic kidneys with multiple subcentimeter cyst. No hydronephrosis. Normal bladder. Stomach/Bowel: Stomach is within normal limits. Appendix appears normal. No evidence of bowel wall thickening, distention, or inflammatory changes. Vascular/Lymphatic: Aortic atherosclerosis. No enlarged abdominal or pelvic lymph nodes. Reproductive: Prostate is unremarkable. Other: No abdominal wall hernia or abnormality. No abdominopelvic ascites. Musculoskeletal: No fracture is seen. IMPRESSION: 1. No acute process identified.  No findings of bowel obstruction. 2. Atrophic kidneys. 3. Aortic and coronary artery calcific atherosclerosis. Electronically Signed  By: Mitzi Hansen M.D.   On: 08/07/2017 20:28   Dg Chest Portable 1 View  Result Date: 08/07/2017 CLINICAL DATA:  Fatigue. EXAM: PORTABLE CHEST 1 VIEW COMPARISON:  Radiograph of July 06, 2017. FINDINGS: Stable cardiomediastinal silhouette. Status post coronary artery bypass graft. No pneumothorax or pleural effusion is noted. Both lungs are clear. The visualized skeletal structures are unremarkable. IMPRESSION: No acute cardiopulmonary abnormality seen. Electronically Signed   By: Lupita Raider, M.D.   On: 08/07/2017 14:25     CBC Recent Labs  Lab 08/07/17 1406 08/08/17 0353 08/09/17 0439  WBC 12.4* 10.2 9.0  HGB 8.4* 8.8* 9.3*  HCT 26.0* 27.4* 28.9*  PLT 310 309 298  MCV 94.9 97.2 97.0  MCH 30.7 31.2 31.2  MCHC 32.3 32.1 32.2  RDW 13.5 13.6 13.9    Chemistries  Recent Labs  Lab 08/07/17 1406 08/08/17 0353  NA 138 136  K 3.4* 3.8  CL 96*  96*  CO2 31 29  GLUCOSE 95 77  BUN 40* 48*  CREATININE 10.19* 11.54*  CALCIUM 9.1 8.9   ------------------------------------------------------------------------------------------------------------------ No results for input(s): CHOL, HDL, LDLCALC, TRIG, CHOLHDL, LDLDIRECT in the last 72 hours.  Lab Results  Component Value Date   HGBA1C 4.6 (L) 07/06/2017   ------------------------------------------------------------------------------------------------------------------ No results for input(s): TSH, T4TOTAL, T3FREE, THYROIDAB in the last 72 hours.  Invalid input(s): FREET3 ------------------------------------------------------------------------------------------------------------------ No results for input(s): VITAMINB12, FOLATE, FERRITIN, TIBC, IRON, RETICCTPCT in the last 72 hours.  Coagulation profile Recent Labs  Lab 08/07/17 1641 08/08/17 0353  INR 2.62 2.56    No results for input(s): DDIMER in the last 72 hours.  Cardiac Enzymes No results for input(s): CKMB, TROPONINI, MYOGLOBIN in the last 168 hours.  Invalid input(s): CK ------------------------------------------------------------------------------------------------------------------    Component Value Date/Time   BNP 960.1 (H) 03/04/2017 3524     Shon Hale M.D on 08/09/2017 at 3:59 PM  Between 7am to 7pm - Pager - [phone]  After 7pm go to [email or web address] - password [email or web address]  [facility] -  Office  [phone]   Voice Recognition Reubin Milan dictation system was used to create this note, attempts have been made to correct errors. Please contact the author with questions and/or clarifications.

## 2017-08-09 NOTE — Progress Notes (Signed)
[facility] NEPHROLOGY PROGRESS NOTE  Assessment/ Plan: Pt is a 57 y.o. yo male  With HTN, ESRD on HD MWF, stroke presented with lethargy and low-grade temperature.  MWF - [facility]  4.5hrs, BFR 400, DFR 800,  EDW 88.5kg, 2K/ 2.5Ca Access: LU AVF  Heparin 4000 Unit bolus, intermittent 2000 Unit mid run Mircera 75 mcg q2wks - last 5/22  Assessment/Plan:  #Altered mental status/lethargy likely acute metabolic encephalopathy: Improving # ESRD: MWF.  Blood pressure and volume status acceptable.  Plan for regular dialysis tomorrow. # Anemia: Hemoglobin 9.3.  Received Mircera on 5/22.  Continue to monitor. # Secondary hyperparathyroidism: Phos 4.8, resume hectorol and sensipar.  # HTN/volume: Blood pressure acceptable.  Continue to monitor.   Subjective: Seen and examined at bedside.  Mental status improving.  No chest pain or shortness of breath. Objective Vital signs in last 24 hours: Vitals:   08/08/17 2140 08/08/17 2210 08/09/17 0424 08/09/17 1159  BP: 130/75  133/88 137/87  Pulse: 68 70 71 78  Resp: 20 18 18 20   Temp: 99 F (37.2 C)  98.8 F (37.1 C) 98.9 F (37.2 C)  TempSrc: Oral  Oral Oral  SpO2: 100% 98% 100% 100%  Weight:   89.3 kg (196 lb 13.9 oz)   Height:       Weight change: -0.2 kg (-7.1 oz)  Intake/Output Summary (Last 24 hours) at 08/09/2017 1313 Last data filed at 08/09/2017 1021 Gross per 24 hour  Intake 480 ml  Output -  Net 480 ml       Labs: Basic Metabolic Panel: Recent Labs  Lab 08/07/17 1406 08/08/17 0353  NA 138 136  K 3.4* 3.8  CL 96* 96*  CO2 31 29  GLUCOSE 95 77  BUN 40* 48*  CREATININE 10.19* 11.54*  CALCIUM 9.1 8.9   Liver Function Tests: No results for input(s): AST, ALT, ALKPHOS, BILITOT, PROT, ALBUMIN in the last 168 hours. No results for input(s): LIPASE, AMYLASE in the last 168 hours. No results for input(s): AMMONIA in the last 168 hours. CBC: Recent Labs  Lab 08/07/17 1406 08/08/17 0353 08/09/17 0439   WBC 12.4* 10.2 9.0  HGB 8.4* 8.8* 9.3*  HCT 26.0* 27.4* 28.9*  MCV 94.9 97.2 97.0  PLT 310 309 298   Cardiac Enzymes: No results for input(s): CKTOTAL, CKMB, CKMBINDEX, TROPONINI in the last 168 hours. CBG: Recent Labs  Lab 08/08/17 1851 08/09/17 0125 08/09/17 0704  GLUCAP 77 100* 85    Iron Studies: No results for input(s): IRON, TIBC, TRANSFERRIN, FERRITIN in the last 72 hours. Studies/Results: Ct Head Wo Contrast  Result Date: 08/07/2017 CLINICAL DATA:  57 y/o  M; altered mental status. EXAM: CT HEAD WITHOUT CONTRAST TECHNIQUE: Contiguous axial images were obtained from the base of the skull through the vertex without intravenous contrast. COMPARISON:  07/14/2017 CT head.  07/06/2017 MRI of the head. FINDINGS: Brain: Hypoattenuation is present within left posterior insula and left posterolateral frontal lobe cortex corresponding to the region of infarct on the prior 07/06/2017 MRI of the brain. Stable small chronic infarction within the right temporal lobe, chronic microvascular ischemic changes of the brain, and parenchymal volume loss. No acute infarct, hemorrhage, or mass effect identified. Vascular: Calcific atherosclerosis of carotid siphons and vertebral arteries. No hyperdense vessel identified. Skull: Normal. Negative for fracture or focal lesion. Sinuses/Orbits: No acute finding. Other: None. IMPRESSION: 1. No acute intracranial abnormality identified. 2. Right temporal lobe and left posterior frontal lobe chronic infarctions. Mild chronic microvascular ischemic  changes and parenchymal volume loss of the brain. These results were called by telephone at the time of interpretation on 08/07/2017 at 5:30 pm to Dr. Fayrene Helper , who verbally acknowledged these results. Electronically Signed   By: Mitzi Hansen M.D.   On: 08/07/2017 17:34   Ct Abdomen Pelvis W Contrast  Result Date: 08/07/2017 CLINICAL DATA:  57 y/o M; lethargy and low-grade fever. Vomiting yesterday. EXAM:  CT ABDOMEN AND PELVIS WITH CONTRAST TECHNIQUE: Multidetector CT imaging of the abdomen and pelvis was performed using the standard protocol following bolus administration of intravenous contrast. CONTRAST:  OMNIPAQUE IOHEXOL 300 MG/ML  SOLN COMPARISON:  None. FINDINGS: Lower chest: No acute abnormality. Severe coronary artery calcific atherosclerosis. Hepatobiliary: Several subcentimeter lucencies are present within the liver, too small to characterize, but likely representing hepatic cysts. Punctate calcified granuloma in liver segment 8. No gallbladder wall thickening or pericholecystic fluid. No biliary ductal dilatation. Pancreas: Unremarkable. No pancreatic ductal dilatation or surrounding inflammatory changes. Spleen: Normal in size without focal abnormality. Adrenals/Urinary Tract: Normal adrenal glands. Atrophic kidneys with multiple subcentimeter cyst. No hydronephrosis. Normal bladder. Stomach/Bowel: Stomach is within normal limits. Appendix appears normal. No evidence of bowel wall thickening, distention, or inflammatory changes. Vascular/Lymphatic: Aortic atherosclerosis. No enlarged abdominal or pelvic lymph nodes. Reproductive: Prostate is unremarkable. Other: No abdominal wall hernia or abnormality. No abdominopelvic ascites. Musculoskeletal: No fracture is seen. IMPRESSION: 1. No acute process identified.  No findings of bowel obstruction. 2. Atrophic kidneys. 3. Aortic and coronary artery calcific atherosclerosis. Electronically Signed   By: Mitzi Hansen M.D.   On: 08/07/2017 20:28   Dg Chest Portable 1 View  Result Date: 08/07/2017 CLINICAL DATA:  Fatigue. EXAM: PORTABLE CHEST 1 VIEW COMPARISON:  Radiograph of July 06, 2017. FINDINGS: Stable cardiomediastinal silhouette. Status post coronary artery bypass graft. No pneumothorax or pleural effusion is noted. Both lungs are clear. The visualized skeletal structures are unremarkable. IMPRESSION: No acute cardiopulmonary  abnormality seen. Electronically Signed   By: Lupita Raider, M.D.   On: 08/07/2017 14:25    Medications: Infusions:   Scheduled Medications: . chlorhexidine  15 mL Mouth Rinse BID  . Chlorhexidine Gluconate Cloth  6 each Topical Q0600  . mouth rinse  15 mL Mouth Rinse BID  . mouth rinse  15 mL Mouth Rinse q12n4p  . pantoprazole  40 mg Intravenous Daily  . sodium chloride flush  3 mL Intravenous Q12H    have reviewed scheduled and prn medications.  Physical Exam: General:NAD, comfortable Heart:RRR, s1s2 nl Lungs:clear b/l, no crackles Abdomen:soft, Non-tender, non-distended Extremities:No edema Dialysis Access: Left upper extremity AV fistula.  Dron Prasad Bhandari 08/09/2017,1:13 PM  LOS: 2 days

## 2017-08-09 NOTE — Progress Notes (Signed)
Patient resting comfortably during shift report. Denies complaints.  

## 2017-08-10 DIAGNOSIS — E7800 Pure hypercholesterolemia, unspecified: Secondary | ICD-10-CM | POA: Diagnosis not present

## 2017-08-10 DIAGNOSIS — Z992 Dependence on renal dialysis: Secondary | ICD-10-CM | POA: Diagnosis not present

## 2017-08-10 DIAGNOSIS — Z7401 Bed confinement status: Secondary | ICD-10-CM | POA: Diagnosis not present

## 2017-08-10 DIAGNOSIS — D631 Anemia in chronic kidney disease: Secondary | ICD-10-CM | POA: Diagnosis not present

## 2017-08-10 DIAGNOSIS — S41102A Unspecified open wound of left upper arm, initial encounter: Secondary | ICD-10-CM | POA: Diagnosis not present

## 2017-08-10 DIAGNOSIS — I63512 Cerebral infarction due to unspecified occlusion or stenosis of left middle cerebral artery: Secondary | ICD-10-CM | POA: Diagnosis not present

## 2017-08-10 DIAGNOSIS — T82858A Stenosis of vascular prosthetic devices, implants and grafts, initial encounter: Secondary | ICD-10-CM | POA: Diagnosis not present

## 2017-08-10 DIAGNOSIS — F0390 Unspecified dementia without behavioral disturbance: Secondary | ICD-10-CM | POA: Diagnosis not present

## 2017-08-10 DIAGNOSIS — M6281 Muscle weakness (generalized): Secondary | ICD-10-CM | POA: Diagnosis not present

## 2017-08-10 DIAGNOSIS — Z8673 Personal history of transient ischemic attack (TIA), and cerebral infarction without residual deficits: Secondary | ICD-10-CM | POA: Diagnosis not present

## 2017-08-10 DIAGNOSIS — I959 Hypotension, unspecified: Secondary | ICD-10-CM | POA: Diagnosis not present

## 2017-08-10 DIAGNOSIS — I214 Non-ST elevation (NSTEMI) myocardial infarction: Secondary | ICD-10-CM | POA: Diagnosis not present

## 2017-08-10 DIAGNOSIS — R278 Other lack of coordination: Secondary | ICD-10-CM | POA: Diagnosis not present

## 2017-08-10 DIAGNOSIS — R4701 Aphasia: Secondary | ICD-10-CM | POA: Diagnosis not present

## 2017-08-10 DIAGNOSIS — I120 Hypertensive chronic kidney disease with stage 5 chronic kidney disease or end stage renal disease: Secondary | ICD-10-CM | POA: Diagnosis not present

## 2017-08-10 DIAGNOSIS — M545 Low back pain: Secondary | ICD-10-CM | POA: Diagnosis not present

## 2017-08-10 DIAGNOSIS — G8191 Hemiplegia, unspecified affecting right dominant side: Secondary | ICD-10-CM | POA: Diagnosis not present

## 2017-08-10 DIAGNOSIS — N2581 Secondary hyperparathyroidism of renal origin: Secondary | ICD-10-CM | POA: Diagnosis not present

## 2017-08-10 DIAGNOSIS — N186 End stage renal disease: Secondary | ICD-10-CM | POA: Diagnosis not present

## 2017-08-10 DIAGNOSIS — R2689 Other abnormalities of gait and mobility: Secondary | ICD-10-CM | POA: Diagnosis not present

## 2017-08-10 DIAGNOSIS — M2550 Pain in unspecified joint: Secondary | ICD-10-CM | POA: Diagnosis not present

## 2017-08-10 DIAGNOSIS — E785 Hyperlipidemia, unspecified: Secondary | ICD-10-CM | POA: Diagnosis not present

## 2017-08-10 DIAGNOSIS — E875 Hyperkalemia: Secondary | ICD-10-CM | POA: Diagnosis not present

## 2017-08-10 DIAGNOSIS — Z5181 Encounter for therapeutic drug level monitoring: Secondary | ICD-10-CM | POA: Diagnosis not present

## 2017-08-10 DIAGNOSIS — R1310 Dysphagia, unspecified: Secondary | ICD-10-CM | POA: Diagnosis not present

## 2017-08-10 DIAGNOSIS — F0150 Vascular dementia without behavioral disturbance: Secondary | ICD-10-CM | POA: Diagnosis not present

## 2017-08-10 DIAGNOSIS — R5383 Other fatigue: Secondary | ICD-10-CM | POA: Diagnosis not present

## 2017-08-10 DIAGNOSIS — I69320 Aphasia following cerebral infarction: Secondary | ICD-10-CM | POA: Diagnosis not present

## 2017-08-10 DIAGNOSIS — Z951 Presence of aortocoronary bypass graft: Secondary | ICD-10-CM | POA: Diagnosis not present

## 2017-08-10 DIAGNOSIS — I69351 Hemiplegia and hemiparesis following cerebral infarction affecting right dominant side: Secondary | ICD-10-CM | POA: Diagnosis not present

## 2017-08-10 DIAGNOSIS — I871 Compression of vein: Secondary | ICD-10-CM | POA: Diagnosis not present

## 2017-08-10 DIAGNOSIS — K5900 Constipation, unspecified: Secondary | ICD-10-CM | POA: Diagnosis not present

## 2017-08-10 DIAGNOSIS — R4189 Other symptoms and signs involving cognitive functions and awareness: Secondary | ICD-10-CM | POA: Diagnosis not present

## 2017-08-10 DIAGNOSIS — K922 Gastrointestinal hemorrhage, unspecified: Secondary | ICD-10-CM | POA: Diagnosis not present

## 2017-08-10 DIAGNOSIS — R195 Other fecal abnormalities: Secondary | ICD-10-CM | POA: Diagnosis not present

## 2017-08-10 DIAGNOSIS — E162 Hypoglycemia, unspecified: Secondary | ICD-10-CM | POA: Diagnosis not present

## 2017-08-10 DIAGNOSIS — M6249 Contracture of muscle, multiple sites: Secondary | ICD-10-CM | POA: Diagnosis not present

## 2017-08-10 DIAGNOSIS — I639 Cerebral infarction, unspecified: Secondary | ICD-10-CM | POA: Diagnosis not present

## 2017-08-10 DIAGNOSIS — F329 Major depressive disorder, single episode, unspecified: Secondary | ICD-10-CM | POA: Diagnosis not present

## 2017-08-10 DIAGNOSIS — I69391 Dysphagia following cerebral infarction: Secondary | ICD-10-CM | POA: Diagnosis not present

## 2017-08-10 DIAGNOSIS — I10 Essential (primary) hypertension: Secondary | ICD-10-CM | POA: Diagnosis not present

## 2017-08-10 DIAGNOSIS — D509 Iron deficiency anemia, unspecified: Secondary | ICD-10-CM | POA: Diagnosis not present

## 2017-08-10 DIAGNOSIS — I248 Other forms of acute ischemic heart disease: Secondary | ICD-10-CM | POA: Diagnosis not present

## 2017-08-10 DIAGNOSIS — I5042 Chronic combined systolic (congestive) and diastolic (congestive) heart failure: Secondary | ICD-10-CM | POA: Diagnosis not present

## 2017-08-10 LAB — RENAL FUNCTION PANEL
Albumin: 3 g/dL — ABNORMAL LOW (ref 3.5–5.0)
Anion gap: 12 (ref 5–15)
BUN: 35 mg/dL — AB (ref 6–20)
CHLORIDE: 99 mmol/L — AB (ref 101–111)
CO2: 28 mmol/L (ref 22–32)
CREATININE: 10.66 mg/dL — AB (ref 0.61–1.24)
Calcium: 9.2 mg/dL (ref 8.9–10.3)
GFR calc Af Amer: 5 mL/min — ABNORMAL LOW (ref >60)
GFR, EST NON AFRICAN AMERICAN: 5 mL/min — AB (ref >60)
Glucose, Bld: 86 mg/dL (ref 65–99)
Phosphorus: 2.3 mg/dL — ABNORMAL LOW (ref 2.5–4.6)
Potassium: 3.9 mmol/L (ref 3.5–5.1)
SODIUM: 139 mmol/L (ref 135–145)

## 2017-08-10 LAB — CBC
HCT: 28.6 % — ABNORMAL LOW (ref 39.0–52.0)
Hemoglobin: 9.2 g/dL — ABNORMAL LOW (ref 13.0–17.0)
MCH: 31.5 pg (ref 26.0–34.0)
MCHC: 32.2 g/dL (ref 30.0–36.0)
MCV: 97.9 fL (ref 78.0–100.0)
PLATELETS: 314 10*3/uL (ref 150–400)
RBC: 2.92 MIL/uL — ABNORMAL LOW (ref 4.22–5.81)
RDW: 14.3 % (ref 11.5–15.5)
WBC: 9.9 10*3/uL (ref 4.0–10.5)

## 2017-08-10 LAB — PROTIME-INR
INR: 1.53
PROTHROMBIN TIME: 18.3 s — AB (ref 11.4–15.2)

## 2017-08-10 LAB — GLUCOSE, CAPILLARY
GLUCOSE-CAPILLARY: 82 mg/dL (ref 65–99)
GLUCOSE-CAPILLARY: 87 mg/dL (ref 65–99)
Glucose-Capillary: 89 mg/dL (ref 65–99)

## 2017-08-10 MED ORDER — SODIUM CHLORIDE 0.9 % IV SOLN: 100.0000 mL | INTRAVENOUS | Status: DC | PRN

## 2017-08-10 MED ORDER — ALTEPLASE 2 MG IJ SOLR: 2.0000 mg | Freq: Once | INTRAMUSCULAR | Status: DC | PRN

## 2017-08-10 MED ORDER — PENTAFLUOROPROP-TETRAFLUOROETH EX AERO: 1.0000 "application " | INHALATION_SPRAY | CUTANEOUS | Status: DC | PRN

## 2017-08-10 MED ORDER — HEPARIN SODIUM (PORCINE) 1000 UNIT/ML DIALYSIS: 1000.0000 [IU] | INTRAMUSCULAR | Status: DC | PRN

## 2017-08-10 MED ORDER — NEPRO/CARBSTEADY PO LIQD: 237.0000 mL | Freq: Two times a day (BID) | ORAL | 3 refills | Status: AC

## 2017-08-10 MED ORDER — LIDOCAINE-PRILOCAINE 2.5-2.5 % EX CREA: 1.0000 "application " | TOPICAL_CREAM | CUTANEOUS | Status: DC | PRN

## 2017-08-10 MED ORDER — ONDANSETRON HCL 4 MG PO TABS: 4.0000 mg | ORAL_TABLET | Freq: Four times a day (QID) | ORAL | 0 refills | Status: AC | PRN

## 2017-08-10 MED ORDER — WARFARIN SODIUM 7.5 MG PO TABS: 7.5000 mg | ORAL_TABLET | Freq: Once | ORAL | Status: DC

## 2017-08-10 MED ORDER — LIDOCAINE HCL (PF) 1 % IJ SOLN: 5.0000 mL | INTRAMUSCULAR | Status: DC | PRN

## 2017-08-10 MED ORDER — DOXERCALCIFEROL 4 MCG/2ML IV SOLN
INTRAVENOUS | Status: AC
  Filled 2017-08-10: qty 4

## 2017-08-10 MED ORDER — METOPROLOL TARTRATE 25 MG PO TABS: 12.5000 mg | ORAL_TABLET | Freq: Two times a day (BID) | ORAL | 1 refills | Status: AC

## 2017-08-10 MED ORDER — HEPARIN SODIUM (PORCINE) 1000 UNIT/ML IJ SOLN
4000.0000 [IU] | Freq: Once | INTRAMUSCULAR | Status: AC
  Administered 2017-08-10: 4000 [IU] via INTRAVENOUS

## 2017-08-10 MED ORDER — ASPIRIN 81 MG PO TBEC: 81.0000 mg | DELAYED_RELEASE_TABLET | Freq: Every day | ORAL | 12 refills | Status: AC

## 2017-08-10 MED ORDER — TIZANIDINE HCL 2 MG PO TABS: 2.0000 mg | ORAL_TABLET | Freq: Every day | ORAL | 2 refills | Status: DC

## 2017-08-10 NOTE — Clinical Social Work Note (Signed)
Clinical Social Work Assessment  Patient Details  Name: [name] MRN: [id number] Date of Birth: [birth year]  Date of referral:  08/10/17               Reason for consult:  Discharge Planning                Permission sought to share information with:  Family Supports Permission granted to share information::  Yes, Verbal Permission Granted  Name::     [name]  Agency::  [facility]  Relationship::  hcpoa/sister  Contact Information:  [phone]  Housing/Transportation Living arrangements for the past 2 months:  Skilled Nursing Facility, Single Family Home Source of Information:  Siblings Patient Interpreter Needed:  None Criminal Activity/Legal Involvement Pertinent to Current Situation/Hospitalization:  No - Comment as needed Significant Relationships:  Siblings Lives with:  Facility Resident Do you feel safe going back to the place where you live?  No Need for family participation in patient care:  Yes (Comment)  Care giving concerns:  CSW unable to assess patient as his only orient to self. Patient was currently at dialysis when CSW spoke to patient sister (Mimim) via phone.  Social Worker assessment / plan:  CSW spoke to [employer] via phone. [name] stated she would like patient to return back to [facility]. CSW verified with facility and they stated they are ready for him whenever discharge from hospital.  Employment status:  Disabled (Comment on whether or not currently receiving Disability) Insurance information:  Medicare PT Recommendations:  Skilled Nursing Facility Information / Referral to community resources:  Skilled Nursing Facility  Patient/Family's Response to care:  Patients sister very supportive of patient and his needs. [name] thanked [employer] for all they have done for patient  Patient/Family's Understanding of and Emotional Response to Diagnosis, Current Treatment, and Prognosis:  Family agreeable for patient to return to [facility] and utilize [employer] for  transport.  Emotional Assessment Appearance:  Appears stated age Attitude/Demeanor/Rapport:  Unable to Assess Affect (typically observed):  Unable to Assess Orientation:  Oriented to Self Alcohol / Substance use:  Not Applicable Psych involvement (Current and /or in the community):  No (Comment)  Discharge Needs  Concerns to be addressed:  Care Coordination Readmission within the last 30 days:  No Current discharge risk:  Physical Impairment Barriers to Discharge:  No Barriers Identified   Althea Charon, LCSW 08/10/2017, 12:36 PM

## 2017-08-10 NOTE — Progress Notes (Signed)
ANTICOAGULATION CONSULT NOTE - Initial Consult  Pharmacy Consult for Warfarin Indication: stroke  Allergies  Allergen Reactions  . Shrimp [Shellfish Allergy] Shortness Of Breath  . Atorvastatin Other (See Comments)    weakness  . Insulins Other (See Comments)    Patient unsure as to the kind of insulin but states that it has previously caused seizures.  Most recent hospitalization (Jan 2013) received insulin but did not have reaction. LIKELY DUE TO SIGNIFICANT HYPOGLYCEMIA  . Simvastatin Other (See Comments)     abnormal liver tests  . Ultram [Tramadol] Other (See Comments)    Liver enzyme    Patient Measurements: Height: 6\' 3"  (190.5 cm) Weight: 201 lb 15.1 oz (91.6 kg) IBW/kg (Calculated) : 84.5  Vital Signs: Temp: 98.3 F (36.8 C) (05/27 0326) Temp Source: Oral (05/27 0326) BP: 139/87 (05/27 0326) Pulse Rate: 69 (05/27 0326)  Labs: Recent Labs    08/07/17 1406  08/08/17 0353 08/09/17 0439 08/09/17 1651 08/10/17 0402  HGB 8.4*  --  8.8* 9.3*  --   --   HCT 26.0*  --  27.4* 28.9*  --   --   PLT 310  --  309 298  --   --   LABPROT  --    < > 27.3*  --  18.6* 18.3*  INR  --    < > 2.56  --  1.56 1.53  CREATININE 10.19*  --  11.54*  --   --   --    < > = values in this interval not displayed.   Assessment: 55 yoM on warfarin PTA for CVA in April 2019. Warfarin held on admit x2 days for possible GIB, but this has now been ruled out. Pharmacy consulted to resume warfarin dosing. INR remains below goal at 1.53 this AM. Hgb stable 9.2, pltc WNL.  PTA warfarin regimen: 5.5mg  daily (admit INR 2.62)  Goal of Therapy:  INR 2-2.5 Monitor platelets by anticoagulation protocol: Yes   Plan:  Warfarin 7.5mg  PO x1 - anticipate PTA regimen may be resumed tomorrow Daily INR Monitor s/sx of bleeding  Lanina Larranaga N. Zigmund Daniel, PharmD PGY1 Pharmacy Resident Pager: [phone] 08/10/2017,7:31 AM

## 2017-08-10 NOTE — Progress Notes (Signed)
Patient resting comfortably during shift report. Denies complaints.  

## 2017-08-10 NOTE — Progress Notes (Signed)
Pt gone to dialysis 

## 2017-08-10 NOTE — Progress Notes (Signed)
Pt has no legal guardian, sister is HCPOA.

## 2017-08-10 NOTE — NC FL2 (Signed)
[place] MEDICAID FL2 LEVEL OF CARE SCREENING TOOL     IDENTIFICATION  Patient Name: [name] Birthdate: 02/03/1961 Sex: male Admission Date (Current Location): 08/07/2017  [facility] and [place] Number:  Producer, television/film/video and Address:  [facility], [address] [place], [place] [address]      Provider Number: [id number]  Attending Physician Name and Address:  Shon Hale, MD  Relative Name and Phone Number:       Current Level of Care: Hospital Recommended Level of Care: Skilled Nursing Facility Prior Approval Number:    Date Approved/Denied:   PASRR Number: [id number]  Discharge Plan: SNF    Current Diagnoses: Patient Active Problem List   Diagnosis Date Noted  . Occult GI bleeding 08/07/2017  . LV (left ventricular) mural thrombus following MI (HCC)   . Chronic combined systolic and diastolic congestive heart failure (HCC) 07/08/2017  . History of CVA (cerebrovascular accident)   . Dysphagia, post-stroke   . Anemia of chronic disease   . 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 Cardiomyopathy (HCC) 05/12/2017  . Elevated PSA 11/04/2016  . OSA (obstructive sleep apnea) 01/08/2016  . Mild cognitive impairment 12/11/2015  . Sleep disorder breathing 10/31/2015  . End stage renal disease on dialysis (HCC) 12/06/2014  . Pulse irregularity 09/13/2014  . Nail disease 01/22/2014  . CVD (cerebrovascular disease) 12/29/2013  . Abnormality of gait 12/22/2013  . Left-sided weakness 07/02/2012  . Subclavian artery stenosis (HCC) 03/26/2012  . Itching 02/04/2012  . Mouth abscess 11/11/2011  . Elevated troponin 11/11/2011  . Right ankle pain 09/10/2011  . S/P coronary artery stent placement, PTCA/DES Resolute to mid-LAD via the LIMA graft, and PTCA of apical 95% stenosis 06/2011 07/04/2011  . S/P PTCA (percutaneous transluminal coronary angioplasty), 06/30/11 07/01/2011  . Stable angina: with acute T wave inversion.And  unstable angina 06/30/2011  . Impaired glucose tolerance 05/13/2011  . 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 Preventative health care 05/10/2011  . S/P CABG x 4: 04/01/11-(LIMA to LAD, SVG to PL branch of RCA, Sequential SVG to OM1/2) 04/07/2011  . CAD (coronary artery disease) 03/28/2011  . Gout 03/27/2011  . PERIPHERAL VASCULAR DISEASE 05/22/2008  . ACHALASIA 05/22/2008  . GERD 05/22/2008  . Hyperlipidemia 10/25/2006  . Depression 10/25/2006  . Essential hypertension 10/25/2006  . HEMORRHOIDS 10/25/2006  . ALLERGIC RHINITIS 10/25/2006  . DEGENERATIVE JOINT DISEASE, SHOULDER 10/25/2006  . Headache(784.0) 10/25/2006  . RENAL CALCULUS, HX OF 10/25/2006    Orientation RESPIRATION BLADDER Height & Weight     Self  Other (Comment)(cpap at night) External catheter Weight: 192 lb 7.4 oz (87.3 kg)(Bed Scale) Height:  6\' 3"  (190.5 cm)  BEHAVIORAL SYMPTOMS/MOOD NEUROLOGICAL BOWEL NUTRITION STATUS      Continent, Incontinent Diet(view d/c summary)  AMBULATORY STATUS COMMUNICATION OF NEEDS Skin   Extensive Assist Non-Verbally(he say's "yes") Normal                       Personal Care Assistance Level of Assistance  Bathing, Feeding, Dressing Bathing Assistance: Maximum assistance Feeding assistance: Maximum assistance(renal diet) Dressing Assistance: Maximum assistance     Functional Limitations Info  Sight, Hearing, Speech Sight Info: Impaired Hearing Info: Adequate Speech Info: Impaired    SPECIAL CARE FACTORS FREQUENCY        PT Frequency: 5x wk OT Frequency: 5x wk            Contractures Contractures Info: Not present  Additional Factors Info  Allergies, Code Status Code Status Info: Full Code Allergies Info: SHRIMP SHELLFISH ALLERGY, ATORVASTATIN, INSULINS, SIMVASTATIN, ULTRAM TRAMADOL     Isolation Precautions Info: MRSA     Current Medications (08/10/2017):  This is the current hospital active medication list Current Facility-Administered  Medications  Medication Dose Route Frequency Provider Last Rate Last Dose  . 0.9 %  sodium chloride infusion  100 mL Intravenous PRN Maxie Barb, MD      . 0.9 %  sodium chloride infusion  100 mL Intravenous PRN Maxie Barb, MD      . acetaminophen (TYLENOL) tablet 650 mg  650 mg Oral Q6H PRN Opyd, Lavone Neri, MD       Or  . acetaminophen (TYLENOL) suppository 650 mg  650 mg Rectal Q6H PRN Opyd, Lavone Neri, MD      . alteplase (CATHFLO ACTIVASE) injection 2 mg  2 mg Intracatheter Once PRN Maxie Barb, MD      . chlorhexidine (PERIDEX) 0.12 % solution 15 mL  15 mL Mouth Rinse BID Triadhosp, McAdmits, MD   15 mL at 08/09/17 2307  . Chlorhexidine Gluconate Cloth 2 % PADS 6 each  6 each Topical Q0600 Opyd, Lavone Neri, MD   6 each at 08/10/17 0608  . Chlorhexidine Gluconate Cloth 2 % PADS 6 each  6 each Topical Q0600 Maxie Barb, MD   6 each at 08/10/17 [phone]  . cinacalcet (SENSIPAR) tablet 90 mg  90 mg Oral Q breakfast Maxie Barb, MD   90 mg at 08/10/17 0610  . doxercalciferol (HECTOROL) injection 5 mcg  5 mcg Intravenous Q M,W,F-HD Maxie Barb, MD   5 mcg at 08/10/17 1029  . feeding supplement (NEPRO CARB STEADY) liquid 237 mL  237 mL Oral BID Mariea Clonts, Courage, MD   237 mL at 08/09/17 2043  . heparin injection 1,000 Units  1,000 Units Dialysis PRN Maxie Barb, MD      . HYDROcodone-acetaminophen (NORCO/VICODIN) 5-325 MG per tablet 1-2 tablet  1-2 tablet Oral Q4H PRN Opyd, Lavone Neri, MD      . lidocaine (PF) (XYLOCAINE) 1 % injection 5 mL  5 mL Intradermal PRN Maxie Barb, MD      . lidocaine-prilocaine (EMLA) cream 1 application  1 application Topical PRN Maxie Barb, MD      . MEDLINE mouth rinse  15 mL Mouth Rinse BID Triadhosp, McAdmits, MD   15 mL at 08/09/17 2308  . MEDLINE mouth rinse  15 mL Mouth Rinse q12n4p Triadhosp, McAdmits, MD   15 mL at 08/09/17 1640  . ondansetron (ZOFRAN) tablet 4 mg  4 mg Oral Q6H  PRN Opyd, Lavone Neri, MD       Or  . ondansetron (ZOFRAN) injection 4 mg  4 mg Intravenous Q6H PRN Opyd, Lavone Neri, MD      . pantoprazole (PROTONIX) injection 40 mg  40 mg Intravenous Daily Opyd, Lavone Neri, MD   40 mg at 08/09/17 1055  . pentafluoroprop-tetrafluoroeth (GEBAUERS) aerosol 1 application  1 application Topical PRN Maxie Barb, MD      . sodium chloride flush (NS) 0.9 % injection 3 mL  3 mL Intravenous Q12H Opyd, Lavone Neri, MD   3 mL at 08/09/17 2310  . warfarin (COUMADIN) tablet 7.5 mg  7.5 mg Oral ONCE-1800 Shon Hale, MD      . Warfarin - Pharmacist Dosing Inpatient   Does not apply [id number] Shon Hale, MD  Discharge Medications: Please see discharge summary for a list of discharge medications.  Relevant Imaging Results:  Relevant Lab Results:   Additional Information SS#: [id number]  Althea Charon, LCSW

## 2017-08-10 NOTE — Progress Notes (Signed)
Placed patient on CPAP via [facility], previous settings (auto max 20- min 5) cm H20. Distilled H20 added to chamber for humidity.

## 2017-08-10 NOTE — Clinical Social Work Placement (Signed)
   CLINICAL SOCIAL WORK PLACEMENT  NOTE  Date:  08/10/2017  Patient Details  Name: [name] MRN: [id number] Date of Birth: September 25, 1960  Clinical Social Work is seeking post-discharge placement for this patient at the Skilled  Nursing Facility level of care (*CSW will initial, date and re-position this form in  chart as items are completed):  Yes   Patient/family provided with [facility] Clinical Social Work Department's list of facilities offering this level of care within the geographic area requested by the patient (or if unable, by the patient's family).  Yes   Patient/family informed of their freedom to choose among providers that offer the needed level of care, that participate in Medicare, Medicaid or managed care program needed by the patient, have an available bed and are willing to accept the patient.  Yes   Patient/family informed of [facility]'s ownership interest in [facility] and [facility], as well as of the fact that they are under no obligation to receive care at these facilities.  PASRR submitted to EDS on       PASRR number received on       Existing PASRR number confirmed on 08/10/17     FL2 transmitted to all facilities in geographic area requested by pt/family on 08/10/17     FL2 transmitted to all facilities within larger geographic area on       Patient informed that his/her managed care company has contracts with or will negotiate with certain facilities, including the following:        Yes   Patient/family informed of bed offers received.  Patient chooses bed at [facility]     Physician recommends and patient chooses bed at      Patient to be transferred to [facility] on 08/10/17.  Patient to be transferred to facility by ptar     Patient family notified on 08/10/17 of transfer.  Name of family member notified:  [name] (poa and pt's sister)      PHYSICIAN Please sign FL2     Additional Comment:     _______________________________________________ Althea Charon, LCSW 08/10/2017, 2:17 PM

## 2017-08-10 NOTE — Progress Notes (Signed)
Initial Nutrition Assessment  DOCUMENTATION CODES:   Not applicable  INTERVENTION:   -Continue Nepro Shake po BID, each supplement provides 425 kcal and 19 grams protein -Feeding assistance with meals  NUTRITION DIAGNOSIS:   Increased nutrient needs related to chronic illness(ESRD on HD) as evidenced by estimated needs.  GOAL:   Patient will meet greater than or equal to 90% of their needs  MONITOR:   PO intake, Supplement acceptance, Labs, Weight trends, Skin, I & O's  REASON FOR ASSESSMENT:   Malnutrition Screening Tool    ASSESSMENT:   [name] is a 57 y.o. male with medical history significant for coronary artery disease status post CABG, chronic combined systolic and diastolic CHF, end-stage renal disease on hemodialysis, and recent CVA with right-sided weakness, now presenting to the emergency department from his SNF with 2 days of lethargy and nausea with vomiting.  Pt admitted with occult GIB and mental status changes.   Visited pt on HD unit; pt able to answer simple close ended questions verbally or by hand gestures. He reports great appetite during hospitalization, but ate poorly at SNF. Reviewed records from [facility]; pt was on a mechanical soft, NAS diet with feeding assistance (pt also restricted bananas, potatoes, tomatoes, and oranges/ orange juice).   Noted meal completion 45-85%; pt also requires feeding assistance per SNF records.   Per nephrology notes, dry wt 88.5 kg. Noted that pt 13.7% wt loss over the past 6 months, which is significant for time frame. However, wt changes are difficult to interpret due to HD. Also noted pt with edema on upper extremities which may be masking true wt loss and muscle depletions.   Labs reviewed: K WDL, Phos: 2.3.   NUTRITION - FOCUSED PHYSICAL EXAM:    Most Recent Value  Orbital Region  No depletion  Upper Arm Region  No depletion  Thoracic and Lumbar Region  No depletion  Buccal Region  No depletion   Temple Region  Mild depletion  Clavicle Bone Region  No depletion  Clavicle and Acromion Bone Region  No depletion  Scapular Bone Region  Unable to assess  Dorsal Hand  Mild depletion  Patellar Region  Mild depletion  Anterior Thigh Region  Mild depletion  Posterior Calf Region  Moderate depletion  Edema (RD Assessment)  Mild  Hair  Reviewed  Eyes  Reviewed  Mouth  Reviewed  Skin  Reviewed  Nails  Reviewed       Diet Order:   Diet Order           Diet - low sodium heart healthy        Diet renal with fluid restriction Fluid restriction: 1200 mL Fluid; Room service appropriate? Yes; Fluid consistency: Thin  Diet effective now          EDUCATION NEEDS:   No education needs have been identified at this time  Skin:  Skin Assessment: Reviewed RN Assessment  Last BM:  08/07/17  Height:   Ht Readings from Last 1 Encounters:  08/07/17 6\' 3"  (1.905 m)    Weight:   Wt Readings from Last 1 Encounters:  08/10/17 186 lb 15.2 oz (84.8 kg)    Ideal Body Weight:  89.1 kg  BMI:  Body mass index is 23.37 kg/m.  Estimated Nutritional Needs:   Kcal:  2100-2300  Protein:  110-125 grams  Fluid:  per MD    Toure Edmonds A. Mayford Knife, RD, LDN, CDE Pager: [phone] After hours Pager: [phone]

## 2017-08-10 NOTE — Progress Notes (Signed)
Call placed to [name] to give report, no answer by nurse. Number left with secretary for nurse to call back for report, or with specific questions if she has any. Pt came to [place] from [facility], so she is familiar with pt at baseline.

## 2017-08-10 NOTE — Discharge Summary (Signed)
[name], is a 57 y.o. male  DOB 01-17-1961  MRN [id number].  Admission date:  08/07/2017  Admitting Physician  Briscoe Deutscher, MD  Discharge Date:  08/10/2017   Primary MD  Corwin Levins, MD  Recommendations for primary care physician for things to follow:   1)Recheck INR on Wednesday 08/12/2017 2)Recheck CBC on Wednesday, 08/12/2017 3)Nutritional supplements and snacks in between meals advised 4)Check blood sugars at least twice a day in between meals/patient is at risk for hypoglycemia 5) continue hemodialysis on Mondays Wednesdays and Fridays per usual schedule  Admission Diagnosis  NSTEMI (non-ST elevated myocardial infarction) (HCC) [I21.4] Symptomatic anemia [D64.9] Constipation, unspecified constipation type [K59.00]   Discharge Diagnosis  NSTEMI (non-ST elevated myocardial infarction) (HCC) [I21.4] Symptomatic anemia [D64.9] Constipation, unspecified constipation type [K59.00]    Principal Problem:   Occult GI bleeding Active Problems:   CAD (coronary artery disease)   Elevated troponin   End stage renal disease on dialysis (HCC)   OSA (obstructive sleep apnea)   History of CVA (cerebrovascular accident)   Chronic combined systolic and diastolic congestive heart failure (HCC)   LV (left ventricular) mural thrombus following MI [facility])      Past Medical History:  Diagnosis Date  . ACHALASIA   . ANEMIA-NOS   . CAD (coronary artery disease), 3 vessel significant disease.    . Cardiomyopathy (HCC) 05/12/2017  . CEREBROVASCULAR ACCIDENT, HX OF   . CHF (congestive heart failure) (HCC)   . CKD (chronic kidney disease) stage 4, GFR 15-29 ml/min (HCC)   . Constipation   . DEGENERATIVE JOINT DISEASE, SHOULDER    knee  . DEPRESSION    2006- not depressed any longer  . Family history of adverse reaction to anesthesia    Mother, hard to awaken  . GERD    no meds required  . Gout    takes Uloric daily  . H/O hiatal hernia   . Headache(784.0)   . Heart attack (HCC)   . History of colon polyps   . History of diastolic dysfunction, grade 2 by echo   . HYPERLIPIDEMIA    takes crestor daily  . Hyperlipidemia   . HYPERTENSION    takes Norvasc,Catapress,Hydralazine,Imdur,and Metoprolol daily  . Impaired glucose tolerance   . Insomnia    takes Ambien nightly  . NSTEMI (non-ST elevated myocardial infarction), 06/30/11 07/01/2011  . RENAL CALCULUS, HX OF   . RENAL INSUFFICIENCY    12/2013- TThSat  . S/P coronary artery stent placement, PTCA/DES Resolute to mid-LAD via the LIMA graft, and PTCA of apical 95% stenosis 07/05/11 07/04/2011  . S/P PTCA (percutaneous transluminal coronary angioplasty), 06/30/11 07/01/2011  . Seizures (HCC)    INSULIN INDUCED  . Sleep apnea    SLEEP STUDY IN [place] , NO MACHINE YET  1 YEAR  . Stroke (HCC)    93/2005/2006/2007;left sided weakness.  1993 12/2013  . Thyroid disease     Past Surgical History:  Procedure Laterality Date  . ARCH AORTOGRAM N/A 02/19/2012   Procedure: [facility]  AORTOGRAM;  Surgeon: Fransisco Hertz, MD;  Location: [facility] CATH LAB;  Service: Cardiovascular;  Laterality: N/A;  . AV FISTULA PLACEMENT  12/02/2011   Procedure: ARTERIOVENOUS (AV) FISTULA CREATION;  Surgeon: Fransisco Hertz, MD;  Location: [facility] OR;  Service: Vascular;  Laterality: Left;  Ultrasound Guided  . AV FISTULA PLACEMENT Left 06/08/2012   Procedure: ARTERIOVENOUS (AV) FISTULA CREATION;  Surgeon: Fransisco Hertz, MD;  Location: [facility] OR;  Service: Vascular;  Laterality: Left;  . BASCILIC VEIN TRANSPOSITION Left 09/06/2014   Procedure: FIRST STAGE BASILIC VEIN TRANSPOSITION ;  Surgeon: Fransisco Hertz, MD;  Location: [facility] OR;  Service: Vascular;  Laterality: Left;  . BASCILIC VEIN TRANSPOSITION Left 12/06/2014   Procedure: SECOND STAGE BRACHIAL VEIN TRANSPOSITION ;  Surgeon: Fransisco Hertz, MD;  Location: [facility] OR;  Service: Vascular;  Laterality: Left;  . CARDIAC CATHETERIZATION    .  COLONOSCOPY    . CORONARY ANGIOPLASTY     06/30/11   AT [facility]  . CORONARY ARTERY BYPASS GRAFT  04/01/2011   Procedure: CORONARY ARTERY BYPASS GRAFTING (CABG);  Surgeon: Kathlee Nations Suann Larry, MD;  Location: [facility] OR;  Service: Open Heart Surgery;  Laterality: N/A;  . HERNIA REPAIR  2013  . INSERTION OF DIALYSIS CATHETER  04/01/2011   Procedure: INSERTION OF DIALYSIS CATHETER;  Surgeon: Nilda Simmer, MD;  Location: [facility] OR;  Service: Vascular;  Laterality: N/A;  . IR ANGIO INTRA EXTRACRAN SEL COM CAROTID INNOMINATE UNI R MOD SED  07/06/2017  . IR ANGIO VERTEBRAL SEL VERTEBRAL BILAT MOD SED  07/06/2017  . IR CT HEAD LTD  07/06/2017  . IR PERCUTANEOUS ART THROMBECTOMY/INFUSION INTRACRANIAL INC DIAG ANGIO  07/06/2017  . KNEE SURGERY Right 01/07/2011   arthroscopy  . LEFT HEART CATH AND CORS/GRAFTS ANGIOGRAPHY N/A 03/05/2017   Procedure: LEFT HEART CATH AND CORS/GRAFTS ANGIOGRAPHY;  Surgeon: Runell Gess, MD;  Location: [facility] INVASIVE CV LAB;  Service: Cardiovascular;  Laterality: N/A;  . LEFT HEART CATHETERIZATION WITH CORONARY ANGIOGRAM N/A 03/28/2011   Procedure: LEFT HEART CATHETERIZATION WITH CORONARY ANGIOGRAM;  Surgeon: Lennette Bihari, MD;  Location: [facility] CATH LAB;  Service: Cardiovascular;  Laterality: N/A;  pending creatnine  . LEFT HEART CATHETERIZATION WITH CORONARY/GRAFT ANGIOGRAM N/A 06/30/2011   Procedure: LEFT HEART CATHETERIZATION WITH Isabel Caprice;  Surgeon: Thurmon Fair, MD;  Location: [facility] CATH LAB;  Service: Cardiovascular;  Laterality: N/A;  . LIGATION OF ARTERIOVENOUS  FISTULA Left 06/08/2012   Procedure: LIGATION OF ARTERIOVENOUS  FISTULA;  Surgeon: Fransisco Hertz, MD;  Location: [facility] OR;  Service: Vascular;  Laterality: Left;  . NM MYOCAR PERF EJECTION FRACTION  06/25/2011   There is evidence of mild ischemia in the basal inferolateral and mid inferolateral regions. (Extent 7 %) The post-stress ejection fraction is 44%. There is mild hypocontractility in the distal inferoapical  segment. baseline T wave inversion is noted in leads I, avL, II, V2. This is a low risk scan. The present perfusion study suggests significant myocardial salvage with mild residual ischemia .  Marland Kitchen PERCUTANEOUS CORONARY INTERVENTION-BALLOON ONLY  06/30/2011   Procedure: PERCUTANEOUS CORONARY INTERVENTION-BALLOON ONLY;  Surgeon: Thurmon Fair, MD;  Location: [facility] CATH LAB;  Service: Cardiovascular;;  . PERCUTANEOUS CORONARY STENT INTERVENTION (PCI-S) N/A 07/04/2011   Procedure: PERCUTANEOUS CORONARY STENT INTERVENTION (PCI-S);  Surgeon: Lennette Bihari, MD;  Location: [facility] CATH LAB;  Service: Cardiovascular;  Laterality: N/A;  . RADIOLOGY WITH ANESTHESIA N/A 07/05/2017   Procedure: RADIOLOGY WITH ANESTHESIA;  Surgeon: Julieanne Cotton, MD;  Location:  [facility] OR;  Service: Radiology;  Laterality: N/A;  . REVISION OF ARTERIOVENOUS GORETEX GRAFT Left 12/06/2014   Procedure: REVISION OF ARTERIOVENOUS GORETEX GRAFT USING 17mmx10cm;  Surgeon: Fransisco Hertz, MD;  Location: [facility] OR;  Service: Vascular;  Laterality: Left;  . STOMACH SURGERY  2009   achalasia  . SUBCLAVIAN STENT PLACEMENT  12/20/2011  . TONSILLECTOMY    . TONSILLECTOMY         HPI  from the history and physical done on the day of admission:    PCP: Corwin Levins, MD   Patient coming from: SNF   Chief Complaint: Lethargy, N/V   HPI: BALLARD BUDNEY is a 57 y.o. male with medical history significant for coronary artery disease status post CABG, chronic combined systolic and diastolic CHF, end-stage renal disease on hemodialysis, and recent CVA with right-sided weakness, now presenting to the emergency department from his SNF with 2 days of lethargy and nausea with vomiting.  Patient was discharged from the hospital 3 weeks ago after management for acute CVA that resulted in significant motor deficits.  He had been doing fairly well until he was noted to be more lethargic over the past 2 days and had some vomiting as well.  It is unclear whether  there is any blood in the vomitus, though the patient denies that.  He denies chest pain and denies abdominal pain or diarrhea.  Family reports that he had been constipated.  ED Course: Upon arrival to the ED, patient is found to be afebrile, saturating well on room air, and with vitals otherwise stable.  EKG features a sinus rhythm with LAD and LVH with repolarization abnormality.  Chest x-ray is negative for acute cardiopulmonary disease, noncontrast head CT is negative for acute intracranial abnormality, and CT abdomen and pelvis is negative for acute intra-abdominal or pelvic pathology.  Chemistry panel is notable for a slight hypokalemia, BUN 40, and creatinine 10.19.  CBC features a leukocytosis to 12,400 and a normocytic anemia with hemoglobin of 8.4, down from recent priors in the 10-12 range.  Fecal occult blood testing is positive but there is no melena or gross blood on DRE.  Lactic acid is reassuringly normal.  INR is therapeutic at 2.62.  Troponin is elevated to 2.80, second troponin 2.67.  Cardiology evaluated the patient in the ED and recommends continued medical management.  Nephrology was also consulted by the ED physician and will arrange for inpatient dialysis as needed.  Patient remains hemodynamically stable, in no apparent respiratory distress, and will be admitted for ongoing evaluation and management of lethargy with nausea, vomiting, and acute on chronic anemia with occult GI bleed.    Hospital Course:   Brief Summary:- [name] a 57 y.o.malewith medical history significant forcoronary artery disease status post CABG, chronic combined systolic and diastolic CHF, end-stage renal disease on hemodialysis, and recent CVA with right-sided weakness admitted 08/07/2016 with recurrent emesis and increased lethargy. Lethargy resolved after hemodialysis sessions   on 08/08/2017 and 08/10/17, awake and following commands at this time, feeding himself.  Cultures remain negative to  date.  Chest x-ray, CT abdomen and pelvis and CT head without acute findings.  Patient appears to be back to his baseline   Plan:-  1)Mental Status change/lethargy-suspect acute metabolic encephalopathy, Lethargy resolved after hemodialysis sessions   on 08/08/2017 and 08/10/17, awake and following commands at this time, feeding himself.  Cultures remain negative to date.  Chest x-ray, CT abdomen and pelvis and CT head without  acute findings.  Patient appears to be back to his baseline. blood sugars have been borderline low, last A1c was 4.6, check Accu-Cheks to avoid significant/symptomatic hypoglycemia.  Leukocytosis has resolved patient remains afebrile  2)Emesis with possible GI bleed--no further emesis, hemoglobin stable, restarted warfarin, recheck INR CBC hemodialysis on Wednesday, 08/12/2017.   3)ESRD-tolerated hemodialysis well on 08/08/2017, his mentation has improved post hemodialysis, for hemodialysis Mondays Wednesdays and Fridays, urologic consult appreciated  4)Social/Ethics-plan of care discussed with patient sister Meimie by phone, patient is a full code, patient sister will work with facility especially on patient's hemodialysis days to make sure he gets a snack to avoid hypoglycemia  5)H/o Prior MI/CAD with intracardiac thrombus/elevated troponin in a patient with ESRD -  EKG without acute changes, continue aspirin,and statins, decrease metoprolol to 12.5 mg twice daily due to BP concerns, Coumadin as above  6)h/o OSA-CPAP nightly  7)Recent CVA with right-sided hemiplegia--functional status unchanged, no new deficits, CT head on admission negative for for new acute findings, c/n aspirin and statin, as well as Coumadin, patient with aphasia and right hemiplegia at baseline unchanged  8)HFrEF--- patient with a history of combined diastolic and systolic dysfunction CHF, volume status is usually managed by hemodialysis, patient appears to be at baseline, BPs are soft, making it  difficult to adjust medications  9) Anemia ckd -sp recent esa (Mircera) at op center, not due for another 9 days   Code Status : full    Disposition Plan  : SNF  Consults  : Nephrology/cardiology  Discharge Condition: stable, appears back to baseline  Follow UP--- repeat INR CBC as outpatient is advised follow-up with nephrologist and PCP  Diet and Activity recommendation:  As advised  Discharge Instructions    Discharge Instructions    (HEART FAILURE PATIENTS) Call MD:  Anytime you have any of the following symptoms: 1) 3 pound weight gain in 24 hours or 5 pounds in 1 week 2) shortness of breath, with or without a dry hacking cough 3) swelling in the hands, feet or stomach 4) if you have to sleep on extra pillows at night in order to breathe.   Complete by:  As directed    Call MD for:  difficulty breathing, headache or visual disturbances   Complete by:  As directed    Call MD for:  persistant dizziness or light-headedness   Complete by:  As directed    Call MD for:  persistant nausea and vomiting   Complete by:  As directed    Call MD for:  severe uncontrolled pain   Complete by:  As directed    Call MD for:  temperature >100.4   Complete by:  As directed    Diet - low sodium heart healthy   Complete by:  As directed    Discharge instructions   Complete by:  As directed    1) recheck INR on Wednesday 08/12/2017 2) recheck CBC on Wednesday, 08/12/2017 3) nutritional supplements and snacks in between meals advised 4) check blood sugars at least twice a day in between meals/patient is at risk for hypoglycemia 5) continue hemodialysis on Mondays Wednesdays and Fridays per usual schedule   Increase activity slowly   Complete by:  As directed         Discharge Medications     Allergies as of 08/10/2017      Reactions   Shrimp [shellfish Allergy] Shortness Of Breath   Atorvastatin Other (See Comments)   weakness   Insulins Other (See  Comments)   Patient unsure as  to the kind of insulin but states that it has previously caused seizures.  Most recent hospitalization (Jan 2013) received insulin but did not have reaction. LIKELY DUE TO SIGNIFICANT HYPOGLYCEMIA   Simvastatin Other (See Comments)    abnormal liver tests   Ultram [tramadol] Other (See Comments)   Liver enzyme       Medication List    STOP taking these medications   amLODipine 2.5 MG tablet Commonly known as:  NORVASC   zolpidem 10 MG tablet Commonly known as:  AMBIEN     TAKE these medications   acetaminophen 325 MG tablet Commonly known as:  TYLENOL Take 2 tablets (650 mg total) by mouth every 4 (four) hours as needed for mild pain (or temp > 37.5 C (99.5 F)).   aspirin 81 MG EC tablet Take 1 tablet (81 mg total) by mouth daily. With food What changed:    additional instructions  Another medication with the same name was removed. Continue taking this medication, and follow the directions you see here.   atorvastatin 80 MG tablet Commonly known as:  LIPITOR Take 1 tablet (80 mg total) by mouth daily.   bisacodyl 10 MG/30ML Enem Commonly known as:  FLEET Place 10 mg rectally every other day.   cinacalcet 30 MG tablet Commonly known as:  SENSIPAR Take 3 tablets (90 mg total) by mouth daily with supper.   cloNIDine 0.1 MG tablet Commonly known as:  CATAPRES Take 0.1 mg by mouth every 8 (eight) hours as needed (SBP >160).   docusate sodium 100 MG capsule Commonly known as:  COLACE Take 100 mg by mouth 2 (two) times daily.   doxercalciferol 4 MCG/2ML injection Commonly known as:  HECTOROL Inject 2.5 mLs (5 mcg total) into the vein every Monday, Wednesday, and Friday with hemodialysis.   febuxostat 40 MG tablet Commonly known as:  ULORIC Take 1 tablet (40 mg total) by mouth daily. What changed:  when to take this   feeding supplement (NEPRO CARB STEADY) Liqd Take 237 mLs by mouth 2 (two) times daily.   fluticasone 50 MCG/ACT nasal spray Commonly known as:   FLONASE Place 1 spray into both nostrils daily.   lanthanum 1000 MG chewable tablet Commonly known as:  [name] Chew 2 tablets (2,000 mg total) by mouth 3 (three) times daily with meals. Yearly physical due in July must see MD for refills   metoprolol tartrate 25 MG tablet Commonly known as:  LOPRESSOR Take 0.5 tablets (12.5 mg total) by mouth 2 (two) times daily. What changed:  how much to take   nitroGLYCERIN 0.4 MG SL tablet Commonly known as:  NITROSTAT PLACE 1 TABLET UNDER TONGUE AS NEEDED FOR CHEST PAIN EVERY 5 MINUTES (MAX 3 DOSES)   ondansetron 4 MG tablet Commonly known as:  ZOFRAN Take 1 tablet (4 mg total) by mouth every 6 (six) hours as needed for nausea.   promethazine 12.5 MG tablet Commonly known as:  PHENERGAN Take 12.5 mg by mouth every 4 (four) hours as needed for nausea or vomiting.   terazosin 10 MG capsule Commonly known as:  HYTRIN TAKE 1 CAPSULE BY MOUTH DAILY AT BEDTIME   tiZANidine 2 MG tablet Commonly known as:  ZANAFLEX Take 1 tablet (2 mg total) by mouth at bedtime. What changed:  when to take this   warfarin 1 MG tablet Commonly known as:  COUMADIN Take 5.5 mg by mouth daily.       Major  procedures and Radiology Reports - PLEASE review detailed and final reports for all details, in brief -    Ct Head Wo Contrast  Result Date: 08/07/2017 CLINICAL DATA:  57 y/o  M; altered mental status. EXAM: CT HEAD WITHOUT CONTRAST TECHNIQUE: Contiguous axial images were obtained from the base of the skull through the vertex without intravenous contrast. COMPARISON:  07/14/2017 CT head.  07/06/2017 MRI of the head. FINDINGS: Brain: Hypoattenuation is present within left posterior insula and left posterolateral frontal lobe cortex corresponding to the region of infarct on the prior 07/06/2017 MRI of the brain. Stable small chronic infarction within the right temporal lobe, chronic microvascular ischemic changes of the brain, and parenchymal volume loss. No  acute infarct, hemorrhage, or mass effect identified. Vascular: Calcific atherosclerosis of carotid siphons and vertebral arteries. No hyperdense vessel identified. Skull: Normal. Negative for fracture or focal lesion. Sinuses/Orbits: No acute finding. Other: None. IMPRESSION: 1. No acute intracranial abnormality identified. 2. Right temporal lobe and left posterior frontal lobe chronic infarctions. Mild chronic microvascular ischemic changes and parenchymal volume loss of the brain. These results were called by telephone at the time of interpretation on 08/07/2017 at 5:30 pm to Dr. Fayrene Helper , who verbally acknowledged these results. Electronically Signed   By: Mitzi Hansen M.D.   On: 08/07/2017 17:34   Ct Head Wo Contrast  Result Date: 07/14/2017 CLINICAL DATA:  Follow-up examination for stroke. EXAM: CT HEAD WITHOUT CONTRAST TECHNIQUE: Contiguous axial images were obtained from the base of the skull through the vertex without intravenous contrast. COMPARISON:  Prior CT from 07/08/2017. FINDINGS: Brain: There has been continued normal interval evolution of previously identified posterior left MCA territory infarct. Previously seen associated hypodensity is less apparent, with a few scattered areas of laminar necrosis now seen within the area of infarction. No evidence for hemorrhagic transformation or other complication. Underlying atrophy with chronic small vessel ischemic disease again noted, stable. No new large vessel territory infarct. No acute intracranial hemorrhage. No mass lesion, midline shift or mass effect. No hydrocephalus. No extra-axial fluid collection. Vascular: No hyperdense vessel. Calcified atherosclerosis at the skull base. Skull: Scalp soft tissues and calvarium demonstrate no acute abnormality. Sinuses/Orbits: Globes and orbital soft tissues within normal limits. Paranasal sinuses are clear. No mastoid effusion. Other: None. IMPRESSION: 1. Continued normal expected interval  evolution of left posterior MCA territory infarct with interval development of small volume laminar necrosis. No evidence for hemorrhagic transformation or other complication. 2. No other new acute intracranial abnormality. 3. Stable background cerebral atrophy with chronic small vessel ischemic changes. Electronically Signed   By: Rise Mu M.D.   On: 07/14/2017 02:38   Ct Abdomen Pelvis W Contrast  Result Date: 08/07/2017 CLINICAL DATA:  57 y/o M; lethargy and low-grade fever. Vomiting yesterday. EXAM: CT ABDOMEN AND PELVIS WITH CONTRAST TECHNIQUE: Multidetector CT imaging of the abdomen and pelvis was performed using the standard protocol following bolus administration of intravenous contrast. CONTRAST:  OMNIPAQUE IOHEXOL 300 MG/ML  SOLN COMPARISON:  None. FINDINGS: Lower chest: No acute abnormality. Severe coronary artery calcific atherosclerosis. Hepatobiliary: Several subcentimeter lucencies are present within the liver, too small to characterize, but likely representing hepatic cysts. Punctate calcified granuloma in liver segment 8. No gallbladder wall thickening or pericholecystic fluid. No biliary ductal dilatation. Pancreas: Unremarkable. No pancreatic ductal dilatation or surrounding inflammatory changes. Spleen: Normal in size without focal abnormality. Adrenals/Urinary Tract: Normal adrenal glands. Atrophic kidneys with multiple subcentimeter cyst. No hydronephrosis. Normal bladder. Stomach/Bowel: Stomach is  within normal limits. Appendix appears normal. No evidence of bowel wall thickening, distention, or inflammatory changes. Vascular/Lymphatic: Aortic atherosclerosis. No enlarged abdominal or pelvic lymph nodes. Reproductive: Prostate is unremarkable. Other: No abdominal wall hernia or abnormality. No abdominopelvic ascites. Musculoskeletal: No fracture is seen. IMPRESSION: 1. No acute process identified.  No findings of bowel obstruction. 2. Atrophic kidneys. 3. Aortic and  coronary artery calcific atherosclerosis. Electronically Signed   By: Mitzi Hansen M.D.   On: 08/07/2017 20:28   Dg Chest Portable 1 View  Result Date: 08/07/2017 CLINICAL DATA:  Fatigue. EXAM: PORTABLE CHEST 1 VIEW COMPARISON:  Radiograph of July 06, 2017. FINDINGS: Stable cardiomediastinal silhouette. Status post coronary artery bypass graft. No pneumothorax or pleural effusion is noted. Both lungs are clear. The visualized skeletal structures are unremarkable. IMPRESSION: No acute cardiopulmonary abnormality seen. Electronically Signed   By: Lupita Raider, M.D.   On: 08/07/2017 14:25    Micro Results    Recent Results (from the past 240 hour(s))  MRSA PCR Screening     Status: None   Collection Time: 08/07/17 11:51 PM  Result Value Ref Range Status   MRSA by PCR NEGATIVE NEGATIVE Final    Comment:        The GeneXpert MRSA Assay (FDA approved for NASAL specimens only), is one component of a comprehensive MRSA colonization surveillance program. It is not intended to diagnose MRSA infection nor to guide or monitor treatment for MRSA infections. Performed at [facility], [address] [place], [place] [address]   Culture, blood (Routine X 2) w Reflex to ID Panel     Status: None (Preliminary result)   Collection Time: 08/08/17  6:15 PM  Result Value Ref Range Status   Specimen Description BLOOD RIGHT HAND  Final   Special Requests   Final    BOTTLES DRAWN AEROBIC AND ANAEROBIC Blood Culture adequate volume   Culture   Final    NO GROWTH < 24 HOURS Performed at [facility], [address], [place], [place] [address]    Report Status PENDING  Incomplete  Culture, blood (Routine X 2) w Reflex to ID Panel     Status: None (Preliminary result)   Collection Time: 08/08/17  6:20 PM  Result Value Ref Range Status   Specimen Description BLOOD RIGHT WRIST  Final   Special Requests   Final    BOTTLES DRAWN AEROBIC ONLY Blood Culture adequate volume    Culture   Final    NO GROWTH < 24 HOURS Performed at [facility], [address], [place], [place] [address]    Report Status PENDING  Incomplete       Today   Subjective    Sharron Simpson today has no new complaints, patient is seen at hemodialysis unit, he is feeding himself with his left hand, able to nod and shake his head to answer questions, per staff who are familiar with him patient appears back to his baseline          Patient has been seen and examined prior to discharge   Objective   Blood pressure 134/89, pulse 86, temperature 98.5 F (36.9 C), temperature source Oral, resp. rate 16, height 6\' 3"  (1.905 m), weight 84.8 kg (186 lb 15.2 oz), SpO2 100 %.   Intake/Output Summary (Last 24 hours) at 08/10/2017 1404 Last data filed at 08/10/2017 1245 Gross per 24 hour  Intake 680 ml  Output 2500 ml  Net -1820 ml  Exam Gen:- Awake Alert, able to follow commands (He is aphasic from previous stroke ) HEENT:- [place].AT, No sclera icterus Neck-Supple Neck,No JVD,.  Lungs-  CTAB , good air movement CV- S1, S2 normal, CABG Abd-  +ve B.Sounds, Abd Soft, No tenderness,    Extremity/Skin:-Left arm AV fistula site with positive thrill and bruit Psych-affect is appropriate,  Neuro-baseline aphasia and baseline right-sided hemiplegia is not new this time (due to recent stroke),      Data Review   CBC w Diff:  Lab Results  Component Value Date   WBC 9.9 08/10/2017   HGB 9.2 (L) 08/10/2017   HCT 28.6 (L) 08/10/2017   PLT 314 08/10/2017   LYMPHOPCT 14 07/08/2017   BANDSPCT 0 07/06/2017   MONOPCT 6 07/08/2017   EOSPCT 3 07/08/2017   BASOPCT 1 07/08/2017    CMP:  Lab Results  Component Value Date   NA 139 08/10/2017   K 3.9 08/10/2017   CL 99 (L) 08/10/2017   CO2 28 08/10/2017   BUN 35 (H) 08/10/2017   CREATININE 10.66 (H) 08/10/2017   PROT 7.0 07/08/2017   PROT 7.0 03/19/2017   ALBUMIN 3.0 (L) 08/10/2017   ALBUMIN 4.4 03/19/2017   BILITOT 0.9  07/08/2017   BILITOT 0.6 03/19/2017   ALKPHOS 49 07/08/2017   AST 21 07/08/2017   ALT <5 (L) 07/08/2017  .   Total Discharge time is about 33 minutes  Shon Hale M.D on 08/10/2017 at 2:04 PM  [facility]   Office  [phone]  Voice Recognition Reubin Milan dictation system was used to create this note, attempts have been made to correct errors. Please contact the author with questions and/or clarifications.

## 2017-08-10 NOTE — Progress Notes (Signed)
[facility] Progress Note  Subjective: no new /co  Vitals:   08/10/17 0900 08/10/17 0929 08/10/17 1000 08/10/17 1030  BP: (!) 141/101 (!) 140/94 (!) 111/95 (!) 119/92  Pulse: 93 89 (!) 102 (!) 109  Resp: (!) 22 17 15 14   Temp:      TempSrc:      SpO2:      Weight:      Height:        Inpatient medications: . chlorhexidine  15 mL Mouth Rinse BID  . Chlorhexidine Gluconate Cloth  6 each Topical Q0600  . Chlorhexidine Gluconate Cloth  6 each Topical Q0600  . cinacalcet  90 mg Oral Q breakfast  . doxercalciferol      . doxercalciferol  5 mcg Intravenous Q M,W,F-HD  . feeding supplement (NEPRO CARB STEADY)  237 mL Oral BID  . mouth rinse  15 mL Mouth Rinse BID  . mouth rinse  15 mL Mouth Rinse q12n4p  . pantoprazole  40 mg Intravenous Daily  . sodium chloride flush  3 mL Intravenous Q12H  . warfarin  7.5 mg Oral ONCE-1800  . Warfarin - Pharmacist Dosing Inpatient   Does not apply q1800   . sodium chloride    . sodium chloride     sodium chloride, sodium chloride, acetaminophen **OR** acetaminophen, alteplase, heparin, HYDROcodone-acetaminophen, lidocaine (PF), lidocaine-prilocaine, ondansetron **OR** ondansetron (ZOFRAN) IV, pentafluoroprop-tetrafluoroeth  Exam:  no distress  no jvd  chest cta bilat  rrr no mrg  abd soft ntnd  ext no edema  lua avf  aphasic uses one-word responses  Dialysis: [facility]  4.5h  88.5kg  2/2.5 lua avf  Hep 4000 then 2000 midrun - mircera 75 ever 2 wks last 5/22      Impression: 1  Altered mental status improved low bs / metabolic / aphasia from recent cva 2  esrd hd [facility] stable volume 3  Hypertension bp's good no bp meds here 4  Anemia ckd sp recent esa at op center, not due for another 9 days 5  Recent cva - w/ aphasia and right hemiplegia 6  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 7  Combined syst/ diast chf  Plan - dialysis today   Vinson Moselle MD [facility] pager [phone]   08/10/2017,  10:56 AM   Recent Labs  Lab 08/07/17 1406 08/08/17 0353 08/10/17 0835  NA 138 136 139  K 3.4* 3.8 3.9  CL 96* 96* 99*  CO2 31 29 28   GLUCOSE 95 77 86  BUN 40* 48* 35*  CREATININE 10.19* 11.54* 10.66*  CALCIUM 9.1 8.9 9.2  PHOS  --   --  2.3*   Recent Labs  Lab 08/10/17 0835  ALBUMIN 3.0*   Recent Labs  Lab 08/08/17 0353 08/09/17 0439 08/10/17 0749  WBC 10.2 9.0 9.9  HGB 8.8* 9.3* 9.2*  HCT 27.4* 28.9* 28.6*  MCV 97.2 97.0 97.9  PLT 309 298 314   Iron/TIBC/Ferritin/ %Sat    Component Value Date/Time   IRON 35 (L) 03/05/2017 0106   TIBC 182 (L) 03/05/2017 0106   FERRITIN 844 (H) 03/05/2017 0106   IRONPCTSAT 19 03/05/2017 0106

## 2017-08-10 NOTE — Progress Notes (Signed)
Clinical Social Worker facilitated patient discharge including contacting patient family and facility to confirm patient discharge plans.  Clinical information faxed to facility and family agreeable with plan.  CSW arranged ambulance transport via [facility] to [facility] .  RN to call [phone] for report prior to discharge.  Clinical Social Worker will sign off for now as social work intervention is no longer needed. Please consult us again if new need arises.  Barton Want, MSW, LCSWA [phone]  

## 2017-08-10 NOTE — Discharge Instructions (Signed)
1) recheck INR on Wednesday 08/12/2017 2) recheck CBC on Wednesday, 08/12/2017 3) nutritional supplements and snacks in between meals advised 4) check blood sugars at least twice a day in between meals/patient is at risk for hypoglycemia 5) continue hemodialysis on Mondays Wednesdays and Fridays per usual schedule

## 2017-08-11 ENCOUNTER — Other Ambulatory Visit: Payer: Self-pay | Admitting: Licensed Clinical Social Worker

## 2017-08-11 DIAGNOSIS — Z992 Dependence on renal dialysis: Secondary | ICD-10-CM | POA: Diagnosis not present

## 2017-08-11 DIAGNOSIS — K922 Gastrointestinal hemorrhage, unspecified: Secondary | ICD-10-CM | POA: Diagnosis not present

## 2017-08-11 DIAGNOSIS — I63512 Cerebral infarction due to unspecified occlusion or stenosis of left middle cerebral artery: Secondary | ICD-10-CM | POA: Diagnosis not present

## 2017-08-11 NOTE — Patient Outreach (Signed)
Assessment:  CSW spoke via phone with [name], sister and Health Care Power of Attorney for client. CSW verified identity of [name]. CSW received verbal permission form [name] on 08/11/17 for CSW to speak with her about client needs and status. Client had been recently hospitalized.  He was discharged from the hospital on 08/10/17 and returned to [facility] to receive care on 08/10/17. [name] said that she had applied for Medicaid for client recently. She said she sent in copies of unmet medical expenses for client for past two years and has sent in [facility]  Application for client. She said she plans to contact Department of Social Services Medicaid caseworker soon to inquire about status of Medicaid application for client.  She has also been communicating periodically with Child psychotherapist at [facility] related to client ongoing needs and discharge plans. She thinks client may need to stay longer at nursing facility to receive needed nursing care and physical therapy support.  She said she is scheduled to meet today with caregivers at nursing facility for a care plan meeting regarding client and client needs. CSW thanked  [name] for phone call with CSW on 08/11/17. CSW encouraged [name] to call CSW at [phone] as needed to discuss social work needs of client. CSW [name] has also encouraged Meimie to communicate with facility social worker to develop client discharge plan.     Plan:  CSW [name] to call client or [name] in 3 weeks to assess client needs at that time.    [name].[name] MSW, LCSW Licensed Clinical Social Worker [facility] Care Management [phone]

## 2017-08-12 DIAGNOSIS — I69320 Aphasia following cerebral infarction: Secondary | ICD-10-CM | POA: Diagnosis not present

## 2017-08-12 DIAGNOSIS — M6249 Contracture of muscle, multiple sites: Secondary | ICD-10-CM | POA: Diagnosis not present

## 2017-08-12 DIAGNOSIS — G8191 Hemiplegia, unspecified affecting right dominant side: Secondary | ICD-10-CM | POA: Diagnosis not present

## 2017-08-12 DIAGNOSIS — R1310 Dysphagia, unspecified: Secondary | ICD-10-CM | POA: Diagnosis not present

## 2017-08-12 DIAGNOSIS — R2689 Other abnormalities of gait and mobility: Secondary | ICD-10-CM | POA: Diagnosis not present

## 2017-08-13 DIAGNOSIS — N186 End stage renal disease: Secondary | ICD-10-CM | POA: Diagnosis not present

## 2017-08-13 DIAGNOSIS — N2581 Secondary hyperparathyroidism of renal origin: Secondary | ICD-10-CM | POA: Diagnosis not present

## 2017-08-13 DIAGNOSIS — Z992 Dependence on renal dialysis: Secondary | ICD-10-CM | POA: Diagnosis not present

## 2017-08-13 DIAGNOSIS — D509 Iron deficiency anemia, unspecified: Secondary | ICD-10-CM | POA: Diagnosis not present

## 2017-08-13 DIAGNOSIS — D631 Anemia in chronic kidney disease: Secondary | ICD-10-CM | POA: Diagnosis not present

## 2017-08-13 LAB — CULTURE, BLOOD (ROUTINE X 2)
Culture: NO GROWTH
Culture: NO GROWTH
Special Requests: ADEQUATE
Special Requests: ADEQUATE

## 2017-08-15 DIAGNOSIS — D631 Anemia in chronic kidney disease: Secondary | ICD-10-CM | POA: Diagnosis not present

## 2017-08-15 DIAGNOSIS — Z992 Dependence on renal dialysis: Secondary | ICD-10-CM | POA: Diagnosis not present

## 2017-08-15 DIAGNOSIS — I120 Hypertensive chronic kidney disease with stage 5 chronic kidney disease or end stage renal disease: Secondary | ICD-10-CM | POA: Diagnosis not present

## 2017-08-15 DIAGNOSIS — N186 End stage renal disease: Secondary | ICD-10-CM | POA: Diagnosis not present

## 2017-08-15 DIAGNOSIS — N2581 Secondary hyperparathyroidism of renal origin: Secondary | ICD-10-CM | POA: Diagnosis not present

## 2017-08-17 DIAGNOSIS — N2581 Secondary hyperparathyroidism of renal origin: Secondary | ICD-10-CM | POA: Diagnosis not present

## 2017-08-17 DIAGNOSIS — D631 Anemia in chronic kidney disease: Secondary | ICD-10-CM | POA: Diagnosis not present

## 2017-08-17 DIAGNOSIS — Z992 Dependence on renal dialysis: Secondary | ICD-10-CM | POA: Diagnosis not present

## 2017-08-17 DIAGNOSIS — N186 End stage renal disease: Secondary | ICD-10-CM | POA: Diagnosis not present

## 2017-08-18 DIAGNOSIS — I63512 Cerebral infarction due to unspecified occlusion or stenosis of left middle cerebral artery: Secondary | ICD-10-CM | POA: Diagnosis not present

## 2017-08-18 DIAGNOSIS — N186 End stage renal disease: Secondary | ICD-10-CM | POA: Diagnosis not present

## 2017-08-18 DIAGNOSIS — G8191 Hemiplegia, unspecified affecting right dominant side: Secondary | ICD-10-CM | POA: Diagnosis not present

## 2017-08-18 DIAGNOSIS — R1310 Dysphagia, unspecified: Secondary | ICD-10-CM | POA: Diagnosis not present

## 2017-08-18 DIAGNOSIS — I69320 Aphasia following cerebral infarction: Secondary | ICD-10-CM | POA: Diagnosis not present

## 2017-08-18 DIAGNOSIS — M6249 Contracture of muscle, multiple sites: Secondary | ICD-10-CM | POA: Diagnosis not present

## 2017-08-18 DIAGNOSIS — R2689 Other abnormalities of gait and mobility: Secondary | ICD-10-CM | POA: Diagnosis not present

## 2017-08-21 DIAGNOSIS — D631 Anemia in chronic kidney disease: Secondary | ICD-10-CM | POA: Diagnosis not present

## 2017-08-21 DIAGNOSIS — Z992 Dependence on renal dialysis: Secondary | ICD-10-CM | POA: Diagnosis not present

## 2017-08-21 DIAGNOSIS — N2581 Secondary hyperparathyroidism of renal origin: Secondary | ICD-10-CM | POA: Diagnosis not present

## 2017-08-21 DIAGNOSIS — N186 End stage renal disease: Secondary | ICD-10-CM | POA: Diagnosis not present

## 2017-08-24 DIAGNOSIS — N186 End stage renal disease: Secondary | ICD-10-CM | POA: Diagnosis not present

## 2017-08-24 DIAGNOSIS — D631 Anemia in chronic kidney disease: Secondary | ICD-10-CM | POA: Diagnosis not present

## 2017-08-24 DIAGNOSIS — N2581 Secondary hyperparathyroidism of renal origin: Secondary | ICD-10-CM | POA: Diagnosis not present

## 2017-08-24 DIAGNOSIS — Z992 Dependence on renal dialysis: Secondary | ICD-10-CM | POA: Diagnosis not present

## 2017-08-25 DIAGNOSIS — I69320 Aphasia following cerebral infarction: Secondary | ICD-10-CM | POA: Diagnosis not present

## 2017-08-25 DIAGNOSIS — N186 End stage renal disease: Secondary | ICD-10-CM | POA: Diagnosis not present

## 2017-08-25 DIAGNOSIS — I10 Essential (primary) hypertension: Secondary | ICD-10-CM | POA: Diagnosis not present

## 2017-08-25 DIAGNOSIS — R2689 Other abnormalities of gait and mobility: Secondary | ICD-10-CM | POA: Diagnosis not present

## 2017-08-25 DIAGNOSIS — R1310 Dysphagia, unspecified: Secondary | ICD-10-CM | POA: Diagnosis not present

## 2017-08-25 DIAGNOSIS — G8191 Hemiplegia, unspecified affecting right dominant side: Secondary | ICD-10-CM | POA: Diagnosis not present

## 2017-08-25 DIAGNOSIS — R5383 Other fatigue: Secondary | ICD-10-CM | POA: Diagnosis not present

## 2017-08-25 DIAGNOSIS — M6249 Contracture of muscle, multiple sites: Secondary | ICD-10-CM | POA: Diagnosis not present

## 2017-08-25 DIAGNOSIS — I63512 Cerebral infarction due to unspecified occlusion or stenosis of left middle cerebral artery: Secondary | ICD-10-CM | POA: Diagnosis not present

## 2017-08-26 DIAGNOSIS — D631 Anemia in chronic kidney disease: Secondary | ICD-10-CM | POA: Diagnosis not present

## 2017-08-26 DIAGNOSIS — N2581 Secondary hyperparathyroidism of renal origin: Secondary | ICD-10-CM | POA: Diagnosis not present

## 2017-08-26 DIAGNOSIS — N186 End stage renal disease: Secondary | ICD-10-CM | POA: Diagnosis not present

## 2017-08-26 DIAGNOSIS — Z992 Dependence on renal dialysis: Secondary | ICD-10-CM | POA: Diagnosis not present

## 2017-08-27 DIAGNOSIS — I69320 Aphasia following cerebral infarction: Secondary | ICD-10-CM | POA: Diagnosis not present

## 2017-08-27 DIAGNOSIS — R1310 Dysphagia, unspecified: Secondary | ICD-10-CM | POA: Diagnosis not present

## 2017-08-27 DIAGNOSIS — R4189 Other symptoms and signs involving cognitive functions and awareness: Secondary | ICD-10-CM | POA: Diagnosis not present

## 2017-08-27 DIAGNOSIS — R2689 Other abnormalities of gait and mobility: Secondary | ICD-10-CM | POA: Diagnosis not present

## 2017-08-27 DIAGNOSIS — M6249 Contracture of muscle, multiple sites: Secondary | ICD-10-CM | POA: Diagnosis not present

## 2017-08-27 DIAGNOSIS — G8191 Hemiplegia, unspecified affecting right dominant side: Secondary | ICD-10-CM | POA: Diagnosis not present

## 2017-08-28 DIAGNOSIS — N2581 Secondary hyperparathyroidism of renal origin: Secondary | ICD-10-CM | POA: Diagnosis not present

## 2017-08-28 DIAGNOSIS — I10 Essential (primary) hypertension: Secondary | ICD-10-CM | POA: Diagnosis not present

## 2017-08-28 DIAGNOSIS — D631 Anemia in chronic kidney disease: Secondary | ICD-10-CM | POA: Diagnosis not present

## 2017-08-28 DIAGNOSIS — E162 Hypoglycemia, unspecified: Secondary | ICD-10-CM | POA: Diagnosis not present

## 2017-08-28 DIAGNOSIS — Z992 Dependence on renal dialysis: Secondary | ICD-10-CM | POA: Diagnosis not present

## 2017-08-28 DIAGNOSIS — N186 End stage renal disease: Secondary | ICD-10-CM | POA: Diagnosis not present

## 2017-08-31 ENCOUNTER — Other Ambulatory Visit: Payer: Self-pay | Admitting: Licensed Clinical Social Worker

## 2017-08-31 DIAGNOSIS — D631 Anemia in chronic kidney disease: Secondary | ICD-10-CM | POA: Diagnosis not present

## 2017-08-31 DIAGNOSIS — N186 End stage renal disease: Secondary | ICD-10-CM | POA: Diagnosis not present

## 2017-08-31 DIAGNOSIS — N2581 Secondary hyperparathyroidism of renal origin: Secondary | ICD-10-CM | POA: Diagnosis not present

## 2017-08-31 DIAGNOSIS — Z992 Dependence on renal dialysis: Secondary | ICD-10-CM | POA: Diagnosis not present

## 2017-08-31 NOTE — Patient Outreach (Signed)
Assessment:  CSW called [name], sister of client, on 08/31/17 to speak with [facility] about client status and needs.CSW verified identity of [name]. CSW received verbal permission from [facility] for CSW to speak with her about client needs and status.  Client had recently been hospitalized in May 2019; following hospitalization, client returned to [facility] facility for care.  Client has been receiving needed nursing care at facility. Gregory reported that client is receiving physical therapy sessions, occupational therapy sessions and speech therapy sessions at facility. [name] has applied for Medicaid for client for long term care in a skilled nursing facility. Gregory said she has not, as yet, heard of Medicaid decision for client. She is hoping to hear soon from Department of Social Services in [place], [place] regarding [place] decision for client.  She said client receives dialysis 3 times weekly as scheduled. She plans to talk with Director of Nursing regarding nutrition of client and his meal schedule, especially on days client receives dialysis. CSW encouraged Gregory to call CSW as needed to discuss social work needs of client. CSW thanked Gregory for phone call with CSW on 08/31/17. Gregory was appreciative of call form CSW on 08/31/17.  Plan:  CSW to call [name] in 3 weeks to assess client needs at that time. [name].[name] MSW, LCSW Licensed Clinical Social Worker [facility] Care Management [phone]

## 2017-09-01 DIAGNOSIS — E162 Hypoglycemia, unspecified: Secondary | ICD-10-CM | POA: Diagnosis not present

## 2017-09-01 DIAGNOSIS — I10 Essential (primary) hypertension: Secondary | ICD-10-CM | POA: Diagnosis not present

## 2017-09-02 DIAGNOSIS — N2581 Secondary hyperparathyroidism of renal origin: Secondary | ICD-10-CM | POA: Diagnosis not present

## 2017-09-02 DIAGNOSIS — N186 End stage renal disease: Secondary | ICD-10-CM | POA: Diagnosis not present

## 2017-09-02 DIAGNOSIS — D631 Anemia in chronic kidney disease: Secondary | ICD-10-CM | POA: Diagnosis not present

## 2017-09-02 DIAGNOSIS — Z992 Dependence on renal dialysis: Secondary | ICD-10-CM | POA: Diagnosis not present

## 2017-09-04 DIAGNOSIS — I69320 Aphasia following cerebral infarction: Secondary | ICD-10-CM | POA: Diagnosis not present

## 2017-09-04 DIAGNOSIS — R2689 Other abnormalities of gait and mobility: Secondary | ICD-10-CM | POA: Diagnosis not present

## 2017-09-04 DIAGNOSIS — D631 Anemia in chronic kidney disease: Secondary | ICD-10-CM | POA: Diagnosis not present

## 2017-09-04 DIAGNOSIS — Z992 Dependence on renal dialysis: Secondary | ICD-10-CM | POA: Diagnosis not present

## 2017-09-04 DIAGNOSIS — M6249 Contracture of muscle, multiple sites: Secondary | ICD-10-CM | POA: Diagnosis not present

## 2017-09-04 DIAGNOSIS — N186 End stage renal disease: Secondary | ICD-10-CM | POA: Diagnosis not present

## 2017-09-04 DIAGNOSIS — R1310 Dysphagia, unspecified: Secondary | ICD-10-CM | POA: Diagnosis not present

## 2017-09-04 DIAGNOSIS — N2581 Secondary hyperparathyroidism of renal origin: Secondary | ICD-10-CM | POA: Diagnosis not present

## 2017-09-04 DIAGNOSIS — G8191 Hemiplegia, unspecified affecting right dominant side: Secondary | ICD-10-CM | POA: Diagnosis not present

## 2017-09-07 DIAGNOSIS — Z992 Dependence on renal dialysis: Secondary | ICD-10-CM | POA: Diagnosis not present

## 2017-09-07 DIAGNOSIS — D631 Anemia in chronic kidney disease: Secondary | ICD-10-CM | POA: Diagnosis not present

## 2017-09-07 DIAGNOSIS — N186 End stage renal disease: Secondary | ICD-10-CM | POA: Diagnosis not present

## 2017-09-07 DIAGNOSIS — N2581 Secondary hyperparathyroidism of renal origin: Secondary | ICD-10-CM | POA: Diagnosis not present

## 2017-09-08 ENCOUNTER — Ambulatory Visit (INDEPENDENT_AMBULATORY_CARE_PROVIDER_SITE_OTHER): Payer: Medicare Other | Admitting: Cardiovascular Disease

## 2017-09-08 ENCOUNTER — Encounter: Payer: Self-pay | Admitting: Cardiovascular Disease

## 2017-09-08 ENCOUNTER — Encounter: Payer: Self-pay | Admitting: Neurology

## 2017-09-08 DIAGNOSIS — Z951 Presence of aortocoronary bypass graft: Secondary | ICD-10-CM

## 2017-09-08 DIAGNOSIS — Z8673 Personal history of transient ischemic attack (TIA), and cerebral infarction without residual deficits: Secondary | ICD-10-CM

## 2017-09-08 DIAGNOSIS — I10 Essential (primary) hypertension: Secondary | ICD-10-CM | POA: Diagnosis not present

## 2017-09-08 DIAGNOSIS — G8191 Hemiplegia, unspecified affecting right dominant side: Secondary | ICD-10-CM | POA: Diagnosis not present

## 2017-09-08 DIAGNOSIS — M6249 Contracture of muscle, multiple sites: Secondary | ICD-10-CM | POA: Diagnosis not present

## 2017-09-08 DIAGNOSIS — I69320 Aphasia following cerebral infarction: Secondary | ICD-10-CM | POA: Diagnosis not present

## 2017-09-08 DIAGNOSIS — I639 Cerebral infarction, unspecified: Secondary | ICD-10-CM | POA: Diagnosis not present

## 2017-09-08 DIAGNOSIS — R1310 Dysphagia, unspecified: Secondary | ICD-10-CM | POA: Diagnosis not present

## 2017-09-08 DIAGNOSIS — E7800 Pure hypercholesterolemia, unspecified: Secondary | ICD-10-CM | POA: Diagnosis not present

## 2017-09-08 DIAGNOSIS — R2689 Other abnormalities of gait and mobility: Secondary | ICD-10-CM | POA: Diagnosis not present

## 2017-09-08 NOTE — Assessment & Plan Note (Signed)
History of hyperlipidemia on statin therapy with recent lipid profile performed 07/06/2017 revealing total cholesterol 180, LDL 131 and HDL of 29.

## 2017-09-08 NOTE — Progress Notes (Signed)
09/08/2017 [name]   [birth year]  [id number]  Primary Physician Jonny Ruiz [name], MD Primary Cardiologist: Runell Gess MD [name], [place]  HPI:  [name] is a 57 y.o. mild to moderately overweight, divorced, African American male, father of one 66 year old son . I saw him 11/04/2016. He is accompanied by his sister [facility] today. He has a history of CAD status post bypass grafting by Dr. Kathlee Nations Trigt April 01, 2011 with a LIMA to his LAD, a vein to an obtuse marginal branch 1 and 2, and to the posterolateral branch. His EF was apparently normal pre and postoperatively. He also has difficult to control hypertension, chronic renal insufficiency, now stage 5, for which he sees Dr. Elvis Coil. He is scheduled to have an AV fistula placed by Dr. Imogene Burn sometime in the next several weeks in anticipation of dialysis at some point in the future.   He was admitted in April 2018 with substernal chest pain and anterolateral T-wave inversion and was cathed and intervened on by Dr. Tresa Endo. He had PCI of his OM vein graft insertion which was too small to stent, and stenting of the LAD beyond his LIMA insertion.  He started hemodialysis in October 2015 and apparently had a stroke at the same time as well. He denies chest pain or shortness of breath.  I studied him 03/05/2017 in the setting of a non-STEMI revealing patent grafts with distal disease beyond the OM graft and LIMA graft as well.  Medical therapy was recommended.  He had multiple admissions since January most recently and May of this year with metabolic abnormalities and mild elevation in troponin.  Medical therapy was recommended.  He did have a disabling stroke on 07/05/2017 and unsuccessful mechanical thrombolysis and failed mechanical thrombectomy leaving him with right hemiplegia and aphasia.  He is currently wheelchair-bound and is in a rehab facility.      Current Meds  Medication Sig  . aspirin 81 MG EC tablet  Take 1 tablet (81 mg total) by mouth daily. With food  . atorvastatin (LIPITOR) 80 MG tablet Take 1 tablet (80 mg total) by mouth daily.  . bisacodyl (FLEET) 10 MG/30ML ENEM Place 10 mg rectally every other day.  . cinacalcet (SENSIPAR) 30 MG tablet Take 3 tablets (90 mg total) by mouth daily with supper.  . docusate sodium (COLACE) 100 MG capsule Take 100 mg by mouth 2 (two) times daily.  Marland Kitchen doxercalciferol (HECTOROL) 4 MCG/2ML injection Inject 2.5 mLs (5 mcg total) into the vein every Monday, Wednesday, and Friday with hemodialysis.  . febuxostat (ULORIC) 40 MG tablet Take 1 tablet (40 mg total) by mouth daily. (Patient taking differently: Take 40 mg by mouth every other day. )  . fluticasone (FLONASE) 50 MCG/ACT nasal spray Place 1 spray into both nostrils daily.  Marland Kitchen lanthanum ([name]) 1000 MG chewable tablet Chew 2 tablets (2,000 mg total) by mouth 3 (three) times daily with meals. Yearly physical due in July must see MD for refills  . memantine (NAMENDA) 10 MG tablet Take 10 mg by mouth 2 (two) times daily.  . metoprolol tartrate (LOPRESSOR) 25 MG tablet Take 0.5 tablets (12.5 mg total) by mouth 2 (two) times daily.  . nitroGLYCERIN (NITROSTAT) 0.4 MG SL tablet PLACE 1 TABLET UNDER TONGUE AS NEEDED FOR CHEST PAIN EVERY 5 MINUTES (MAX 3 DOSES)  . Nutritional Supplements (FEEDING SUPPLEMENT, NEPRO CARB STEADY,) LIQD Take 237 mLs by mouth 2 (two) times daily.  Marland Kitchen  ondansetron (ZOFRAN) 4 MG tablet Take 1 tablet (4 mg total) by mouth every 6 (six) hours as needed for nausea.  . promethazine (PHENERGAN) 12.5 MG tablet Take 12.5 mg by mouth every 4 (four) hours as needed for nausea or vomiting.  . terazosin (HYTRIN) 10 MG capsule TAKE 1 CAPSULE BY MOUTH DAILY AT BEDTIME  . tiZANidine (ZANAFLEX) 2 MG tablet Take 1 tablet (2 mg total) by mouth at bedtime.  Marland Kitchen warfarin (COUMADIN) 1 MG tablet Take 5.5 mg by mouth daily.     Allergies  Allergen Reactions  . Shrimp [Shellfish Allergy] Shortness Of Breath    . Atorvastatin Other (See Comments)    weakness  . Insulins Other (See Comments)    Patient unsure as to the kind of insulin but states that it has previously caused seizures.  Most recent hospitalization (Jan 2013) received insulin but did not have reaction. LIKELY DUE TO SIGNIFICANT HYPOGLYCEMIA  . Simvastatin Other (See Comments)     abnormal liver tests  . Ultram [Tramadol] Other (See Comments)    Liver enzyme     Social History   Socioeconomic History  . Marital status: Divorced    Spouse name: Not on file  . Number of children: 1  . Years of education: Bachelors  . Highest education level: Not on file  Occupational History  . Occupation: Disabled  Social Needs  . Financial resource strain: Not on file  . Food insecurity:    Worry: Not on file    Inability: Not on file  . Transportation needs:    Medical: Not on file    Non-medical: Not on file  Tobacco Use  . Smoking status: Never Smoker  . Smokeless tobacco: Former Neurosurgeon    Types: Chew  . Tobacco comment: chewed tobacco   Substance and Sexual Activity  . Alcohol use: Yes    Alcohol/week: 1.2 oz    Types: 2 Shots of liquor per week    Comment: rarely; beer  . Drug use: No  . Sexual activity: Not Currently    Partners: Male  Lifestyle  . Physical activity:    Days per week: Not on file    Minutes per session: Not on file  . Stress: Not on file  Relationships  . Social connections:    Talks on phone: Not on file    Gets together: Not on file    Attends religious service: Not on file    Active member of club or organization: Not on file    Attends meetings of clubs or organizations: Not on file    Relationship status: Not on file  . Intimate partner violence:    Fear of current or ex partner: Not on file    Emotionally abused: Not on file    Physically abused: Not on file    Forced sexual activity: Not on file  Other Topics Concern  . Not on file  Social History Narrative   Lives at home alone.    Right-handed.   Occasional caffeine use.     Review of Systems: General: negative for chills, fever, night sweats or weight changes.  Cardiovascular: negative for chest pain, dyspnea on exertion, edema, orthopnea, palpitations, paroxysmal nocturnal dyspnea or shortness of breath Dermatological: negative for rash Respiratory: negative for cough or wheezing Urologic: negative for hematuria Abdominal: negative for nausea, vomiting, diarrhea, bright red blood per rectum, melena, or hematemesis Neurologic: negative for visual changes, syncope, or dizziness All other systems reviewed and are otherwise negative  except as noted above.    Blood pressure (!) 102/58, pulse (!) 50, height 6' (1.829 m), weight 187 lb (84.8 kg).  General appearance: alert and no distress Neck: no adenopathy, no carotid bruit, no JVD, supple, symmetrical, trachea midline and thyroid not enlarged, symmetric, no tenderness/mass/nodules Lungs: clear to auscultation bilaterally Heart: regular rate and rhythm, S1, S2 normal, no murmur, click, rub or gallop Extremities: extremities normal, atraumatic, no cyanosis or edema Pulses: 2+ and symmetric Skin: Skin color, texture, turgor normal. No rashes or lesions Neurologic: Alert and oriented X 3, normal strength and tone. Normal symmetric reflexes. Normal coordination and gait  EKG not performed today  ASSESSMENT AND PLAN:   Hyperlipidemia History of hyperlipidemia on statin therapy with recent lipid profile performed 07/06/2017 revealing total cholesterol 180, LDL 131 and HDL of 29.  Essential hypertension History of essential hypertension her blood pressure measured at 102/58.  He is on metoprolol.  Continue current meds at current dosing.  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 Lovett Sox with a LIMA to his LAD, vein to the obtuse marginal  branch 1 and 2 into posterior lateral branch.  His EF was normal postoperatively.  He subsequently had intervention by Dr. Tresa Endo with a stent in his LAD beyond LIMA insertion and PCI of his OM vein graft at the insertion.  His last cath was performed by myself 03/05/2017 revealing patent grafts with distal disease in his OM branch and apical LAD.  Medical therapy was recommended.  He had multiple admissions since with non-STEMI treated medically.  History of CVA (cerebrovascular accident) Mr. Kilkenny had a disabling CVA in April of this year with failed mechanical thrombectomy leaving him with right hemiplegia and aphasia, wheelchair bound.  He is on Coumadin anticoagulation.      Runell Gess MD FACP,FACC,FAHA, [facility] 09/08/2017 10:41 AM

## 2017-09-08 NOTE — Patient Instructions (Signed)
Your physician recommends that you schedule a follow-up appointment in: 6 MONTHS WITH Joni Reining DNP  Your physician wants you to follow-up in: ONE YEAR WITH DR San Morelle will receive a reminder letter in the mail two months in advance. If you don't receive a letter, please call our office to schedule the follow-up appointment.    If you need a refill on your cardiac medications before your next appointment, please call your pharmacy.

## 2017-09-08 NOTE — Assessment & Plan Note (Signed)
History of essential hypertension her blood pressure measured at 102/58.  He is on metoprolol.  Continue current meds at current dosing.

## 2017-09-08 NOTE — Assessment & Plan Note (Signed)
History of CAD status post coronary artery bypass grafting 04/01/2011 by Dr. Lovett Sox with a LIMA to his LAD, vein to the obtuse marginal branch 1 and 2 into posterior lateral branch.  His EF was normal postoperatively.  He subsequently had intervention by Dr. Tresa Endo with a stent in his LAD beyond LIMA insertion and PCI of his OM vein graft at the insertion.  His last cath was performed by myself 03/05/2017 revealing patent grafts with distal disease in his OM branch and apical LAD.  Medical therapy was recommended.  He had multiple admissions since with non-STEMI treated medically.

## 2017-09-08 NOTE — Assessment & Plan Note (Signed)
[name] had a disabling CVA in April of this year with failed mechanical thrombectomy leaving him with right hemiplegia and aphasia, wheelchair bound.  He is on Coumadin anticoagulation.

## 2017-09-09 DIAGNOSIS — N186 End stage renal disease: Secondary | ICD-10-CM | POA: Diagnosis not present

## 2017-09-09 DIAGNOSIS — N2581 Secondary hyperparathyroidism of renal origin: Secondary | ICD-10-CM | POA: Diagnosis not present

## 2017-09-09 DIAGNOSIS — Z992 Dependence on renal dialysis: Secondary | ICD-10-CM | POA: Diagnosis not present

## 2017-09-09 DIAGNOSIS — D631 Anemia in chronic kidney disease: Secondary | ICD-10-CM | POA: Diagnosis not present

## 2017-09-10 DIAGNOSIS — G8191 Hemiplegia, unspecified affecting right dominant side: Secondary | ICD-10-CM | POA: Diagnosis not present

## 2017-09-10 DIAGNOSIS — M6249 Contracture of muscle, multiple sites: Secondary | ICD-10-CM | POA: Diagnosis not present

## 2017-09-10 DIAGNOSIS — R1310 Dysphagia, unspecified: Secondary | ICD-10-CM | POA: Diagnosis not present

## 2017-09-10 DIAGNOSIS — I69320 Aphasia following cerebral infarction: Secondary | ICD-10-CM | POA: Diagnosis not present

## 2017-09-10 DIAGNOSIS — R2689 Other abnormalities of gait and mobility: Secondary | ICD-10-CM | POA: Diagnosis not present

## 2017-09-11 DIAGNOSIS — N186 End stage renal disease: Secondary | ICD-10-CM | POA: Diagnosis not present

## 2017-09-11 DIAGNOSIS — F329 Major depressive disorder, single episode, unspecified: Secondary | ICD-10-CM | POA: Diagnosis not present

## 2017-09-11 DIAGNOSIS — Z992 Dependence on renal dialysis: Secondary | ICD-10-CM | POA: Diagnosis not present

## 2017-09-11 DIAGNOSIS — D631 Anemia in chronic kidney disease: Secondary | ICD-10-CM | POA: Diagnosis not present

## 2017-09-11 DIAGNOSIS — R4189 Other symptoms and signs involving cognitive functions and awareness: Secondary | ICD-10-CM | POA: Diagnosis not present

## 2017-09-11 DIAGNOSIS — N2581 Secondary hyperparathyroidism of renal origin: Secondary | ICD-10-CM | POA: Diagnosis not present

## 2017-09-14 DIAGNOSIS — R2689 Other abnormalities of gait and mobility: Secondary | ICD-10-CM | POA: Diagnosis not present

## 2017-09-14 DIAGNOSIS — S41102A Unspecified open wound of left upper arm, initial encounter: Secondary | ICD-10-CM | POA: Diagnosis not present

## 2017-09-14 DIAGNOSIS — M545 Low back pain: Secondary | ICD-10-CM | POA: Diagnosis not present

## 2017-09-14 DIAGNOSIS — N186 End stage renal disease: Secondary | ICD-10-CM | POA: Diagnosis not present

## 2017-09-14 DIAGNOSIS — R1310 Dysphagia, unspecified: Secondary | ICD-10-CM | POA: Diagnosis not present

## 2017-09-14 DIAGNOSIS — G8191 Hemiplegia, unspecified affecting right dominant side: Secondary | ICD-10-CM | POA: Diagnosis not present

## 2017-09-14 DIAGNOSIS — N2581 Secondary hyperparathyroidism of renal origin: Secondary | ICD-10-CM | POA: Diagnosis not present

## 2017-09-14 DIAGNOSIS — I69320 Aphasia following cerebral infarction: Secondary | ICD-10-CM | POA: Diagnosis not present

## 2017-09-14 DIAGNOSIS — D509 Iron deficiency anemia, unspecified: Secondary | ICD-10-CM | POA: Diagnosis not present

## 2017-09-14 DIAGNOSIS — Z992 Dependence on renal dialysis: Secondary | ICD-10-CM | POA: Diagnosis not present

## 2017-09-14 DIAGNOSIS — M6249 Contracture of muscle, multiple sites: Secondary | ICD-10-CM | POA: Diagnosis not present

## 2017-09-14 DIAGNOSIS — I120 Hypertensive chronic kidney disease with stage 5 chronic kidney disease or end stage renal disease: Secondary | ICD-10-CM | POA: Diagnosis not present

## 2017-09-15 ENCOUNTER — Ambulatory Visit: Payer: Medicare Other | Admitting: Neurology

## 2017-09-16 DIAGNOSIS — S41102A Unspecified open wound of left upper arm, initial encounter: Secondary | ICD-10-CM | POA: Diagnosis not present

## 2017-09-16 DIAGNOSIS — N2581 Secondary hyperparathyroidism of renal origin: Secondary | ICD-10-CM | POA: Diagnosis not present

## 2017-09-16 DIAGNOSIS — Z992 Dependence on renal dialysis: Secondary | ICD-10-CM | POA: Diagnosis not present

## 2017-09-16 DIAGNOSIS — D509 Iron deficiency anemia, unspecified: Secondary | ICD-10-CM | POA: Diagnosis not present

## 2017-09-16 DIAGNOSIS — N186 End stage renal disease: Secondary | ICD-10-CM | POA: Diagnosis not present

## 2017-09-17 DIAGNOSIS — F329 Major depressive disorder, single episode, unspecified: Secondary | ICD-10-CM | POA: Diagnosis not present

## 2017-09-17 DIAGNOSIS — F0150 Vascular dementia without behavioral disturbance: Secondary | ICD-10-CM | POA: Diagnosis not present

## 2017-09-18 DIAGNOSIS — S41102A Unspecified open wound of left upper arm, initial encounter: Secondary | ICD-10-CM | POA: Diagnosis not present

## 2017-09-18 DIAGNOSIS — N186 End stage renal disease: Secondary | ICD-10-CM | POA: Diagnosis not present

## 2017-09-18 DIAGNOSIS — D509 Iron deficiency anemia, unspecified: Secondary | ICD-10-CM | POA: Diagnosis not present

## 2017-09-18 DIAGNOSIS — Z992 Dependence on renal dialysis: Secondary | ICD-10-CM | POA: Diagnosis not present

## 2017-09-18 DIAGNOSIS — N2581 Secondary hyperparathyroidism of renal origin: Secondary | ICD-10-CM | POA: Diagnosis not present

## 2017-09-18 DIAGNOSIS — I871 Compression of vein: Secondary | ICD-10-CM | POA: Diagnosis not present

## 2017-09-18 DIAGNOSIS — T82858A Stenosis of vascular prosthetic devices, implants and grafts, initial encounter: Secondary | ICD-10-CM | POA: Diagnosis not present

## 2017-09-21 ENCOUNTER — Other Ambulatory Visit: Payer: Self-pay | Admitting: Licensed Clinical Social Worker

## 2017-09-21 DIAGNOSIS — S41102A Unspecified open wound of left upper arm, initial encounter: Secondary | ICD-10-CM | POA: Diagnosis not present

## 2017-09-21 DIAGNOSIS — R1310 Dysphagia, unspecified: Secondary | ICD-10-CM | POA: Diagnosis not present

## 2017-09-21 DIAGNOSIS — K5900 Constipation, unspecified: Secondary | ICD-10-CM | POA: Diagnosis not present

## 2017-09-21 DIAGNOSIS — I63512 Cerebral infarction due to unspecified occlusion or stenosis of left middle cerebral artery: Secondary | ICD-10-CM | POA: Diagnosis not present

## 2017-09-21 DIAGNOSIS — M545 Low back pain: Secondary | ICD-10-CM | POA: Diagnosis not present

## 2017-09-21 DIAGNOSIS — N2581 Secondary hyperparathyroidism of renal origin: Secondary | ICD-10-CM | POA: Diagnosis not present

## 2017-09-21 DIAGNOSIS — D509 Iron deficiency anemia, unspecified: Secondary | ICD-10-CM | POA: Diagnosis not present

## 2017-09-21 DIAGNOSIS — I69320 Aphasia following cerebral infarction: Secondary | ICD-10-CM | POA: Diagnosis not present

## 2017-09-21 DIAGNOSIS — R4701 Aphasia: Secondary | ICD-10-CM | POA: Diagnosis not present

## 2017-09-21 DIAGNOSIS — Z992 Dependence on renal dialysis: Secondary | ICD-10-CM | POA: Diagnosis not present

## 2017-09-21 DIAGNOSIS — R2689 Other abnormalities of gait and mobility: Secondary | ICD-10-CM | POA: Diagnosis not present

## 2017-09-21 DIAGNOSIS — N186 End stage renal disease: Secondary | ICD-10-CM | POA: Diagnosis not present

## 2017-09-21 DIAGNOSIS — F329 Major depressive disorder, single episode, unspecified: Secondary | ICD-10-CM | POA: Diagnosis not present

## 2017-09-21 DIAGNOSIS — I120 Hypertensive chronic kidney disease with stage 5 chronic kidney disease or end stage renal disease: Secondary | ICD-10-CM | POA: Diagnosis not present

## 2017-09-21 DIAGNOSIS — M6249 Contracture of muscle, multiple sites: Secondary | ICD-10-CM | POA: Diagnosis not present

## 2017-09-21 DIAGNOSIS — G8191 Hemiplegia, unspecified affecting right dominant side: Secondary | ICD-10-CM | POA: Diagnosis not present

## 2017-09-21 NOTE — Patient Outreach (Signed)
Assessment:  CSW spoke via phone with Jessie Foot, sister of client. CSW verified identity of Sherwin Eckrich. CSW received verbal permission from [facility] on 09/21/17 for CSW to speak with her about client needs and status. Client has been receiving care at [facility] in [place], [place].  Client has been receiving nursing care and physical therapy support at nursing facility. [name] had told CSW previously that she had applied for Medicaid for long term care for client and was waiting on a decision regarding such Medicaid coverage from [facility] of Social Services. [name] Woodards reported to CSW on 09/21/17 that client had received Medicaid for long term care. She was happy that client had received Medicaid coverage for long term care at nursing center. Client has been attending scheduled client medical appointments. [name] asked if she could return call to CSW on 09/22/17. CSW agreed. CSW thanked [name] for phone call with CSW on 09/21/17.   Plan:  Jessie Foot to return call to CSW on 09/22/17 at [phone] at request of [facility]  Kelton Pillar.Garet Hooton MSW, LCSW Licensed Clinical Social Worker [facility] Care Management [phone]

## 2017-09-22 ENCOUNTER — Ambulatory Visit: Payer: Self-pay | Admitting: Licensed Clinical Social Worker

## 2017-09-23 DIAGNOSIS — F329 Major depressive disorder, single episode, unspecified: Secondary | ICD-10-CM | POA: Diagnosis not present

## 2017-09-23 DIAGNOSIS — M6249 Contracture of muscle, multiple sites: Secondary | ICD-10-CM | POA: Diagnosis not present

## 2017-09-23 DIAGNOSIS — Z992 Dependence on renal dialysis: Secondary | ICD-10-CM | POA: Diagnosis not present

## 2017-09-23 DIAGNOSIS — G8191 Hemiplegia, unspecified affecting right dominant side: Secondary | ICD-10-CM | POA: Diagnosis not present

## 2017-09-23 DIAGNOSIS — D509 Iron deficiency anemia, unspecified: Secondary | ICD-10-CM | POA: Diagnosis not present

## 2017-09-23 DIAGNOSIS — S41102A Unspecified open wound of left upper arm, initial encounter: Secondary | ICD-10-CM | POA: Diagnosis not present

## 2017-09-23 DIAGNOSIS — I69320 Aphasia following cerebral infarction: Secondary | ICD-10-CM | POA: Diagnosis not present

## 2017-09-23 DIAGNOSIS — M545 Low back pain: Secondary | ICD-10-CM | POA: Diagnosis not present

## 2017-09-23 DIAGNOSIS — R2689 Other abnormalities of gait and mobility: Secondary | ICD-10-CM | POA: Diagnosis not present

## 2017-09-23 DIAGNOSIS — R1310 Dysphagia, unspecified: Secondary | ICD-10-CM | POA: Diagnosis not present

## 2017-09-23 DIAGNOSIS — N186 End stage renal disease: Secondary | ICD-10-CM | POA: Diagnosis not present

## 2017-09-23 DIAGNOSIS — N2581 Secondary hyperparathyroidism of renal origin: Secondary | ICD-10-CM | POA: Diagnosis not present

## 2017-09-24 DIAGNOSIS — Z9114 Patient's other noncompliance with medication regimen: Secondary | ICD-10-CM | POA: Diagnosis not present

## 2017-09-24 DIAGNOSIS — F322 Major depressive disorder, single episode, severe without psychotic features: Secondary | ICD-10-CM | POA: Diagnosis not present

## 2017-09-24 DIAGNOSIS — R4189 Other symptoms and signs involving cognitive functions and awareness: Secondary | ICD-10-CM | POA: Diagnosis not present

## 2017-09-25 DIAGNOSIS — S41102A Unspecified open wound of left upper arm, initial encounter: Secondary | ICD-10-CM | POA: Diagnosis not present

## 2017-09-25 DIAGNOSIS — N186 End stage renal disease: Secondary | ICD-10-CM | POA: Diagnosis not present

## 2017-09-25 DIAGNOSIS — D509 Iron deficiency anemia, unspecified: Secondary | ICD-10-CM | POA: Diagnosis not present

## 2017-09-25 DIAGNOSIS — N2581 Secondary hyperparathyroidism of renal origin: Secondary | ICD-10-CM | POA: Diagnosis not present

## 2017-09-25 DIAGNOSIS — Z992 Dependence on renal dialysis: Secondary | ICD-10-CM | POA: Diagnosis not present

## 2017-09-28 DIAGNOSIS — S41102A Unspecified open wound of left upper arm, initial encounter: Secondary | ICD-10-CM | POA: Diagnosis not present

## 2017-09-28 DIAGNOSIS — N2581 Secondary hyperparathyroidism of renal origin: Secondary | ICD-10-CM | POA: Diagnosis not present

## 2017-09-28 DIAGNOSIS — Z992 Dependence on renal dialysis: Secondary | ICD-10-CM | POA: Diagnosis not present

## 2017-09-28 DIAGNOSIS — N186 End stage renal disease: Secondary | ICD-10-CM | POA: Diagnosis not present

## 2017-09-28 DIAGNOSIS — D509 Iron deficiency anemia, unspecified: Secondary | ICD-10-CM | POA: Diagnosis not present

## 2017-09-30 DIAGNOSIS — Z992 Dependence on renal dialysis: Secondary | ICD-10-CM | POA: Diagnosis not present

## 2017-09-30 DIAGNOSIS — N186 End stage renal disease: Secondary | ICD-10-CM | POA: Diagnosis not present

## 2017-09-30 DIAGNOSIS — N2581 Secondary hyperparathyroidism of renal origin: Secondary | ICD-10-CM | POA: Diagnosis not present

## 2017-09-30 DIAGNOSIS — D509 Iron deficiency anemia, unspecified: Secondary | ICD-10-CM | POA: Diagnosis not present

## 2017-09-30 DIAGNOSIS — S41102A Unspecified open wound of left upper arm, initial encounter: Secondary | ICD-10-CM | POA: Diagnosis not present

## 2017-10-02 DIAGNOSIS — D509 Iron deficiency anemia, unspecified: Secondary | ICD-10-CM | POA: Diagnosis not present

## 2017-10-02 DIAGNOSIS — N186 End stage renal disease: Secondary | ICD-10-CM | POA: Diagnosis not present

## 2017-10-02 DIAGNOSIS — N2581 Secondary hyperparathyroidism of renal origin: Secondary | ICD-10-CM | POA: Diagnosis not present

## 2017-10-02 DIAGNOSIS — Z992 Dependence on renal dialysis: Secondary | ICD-10-CM | POA: Diagnosis not present

## 2017-10-02 DIAGNOSIS — S41102A Unspecified open wound of left upper arm, initial encounter: Secondary | ICD-10-CM | POA: Diagnosis not present

## 2017-10-05 DIAGNOSIS — M6249 Contracture of muscle, multiple sites: Secondary | ICD-10-CM | POA: Diagnosis not present

## 2017-10-05 DIAGNOSIS — S41102A Unspecified open wound of left upper arm, initial encounter: Secondary | ICD-10-CM | POA: Diagnosis not present

## 2017-10-05 DIAGNOSIS — N2581 Secondary hyperparathyroidism of renal origin: Secondary | ICD-10-CM | POA: Diagnosis not present

## 2017-10-05 DIAGNOSIS — N186 End stage renal disease: Secondary | ICD-10-CM | POA: Diagnosis not present

## 2017-10-05 DIAGNOSIS — Z992 Dependence on renal dialysis: Secondary | ICD-10-CM | POA: Diagnosis not present

## 2017-10-05 DIAGNOSIS — D509 Iron deficiency anemia, unspecified: Secondary | ICD-10-CM | POA: Diagnosis not present

## 2017-10-05 DIAGNOSIS — I69320 Aphasia following cerebral infarction: Secondary | ICD-10-CM | POA: Diagnosis not present

## 2017-10-05 DIAGNOSIS — M545 Low back pain: Secondary | ICD-10-CM | POA: Diagnosis not present

## 2017-10-05 DIAGNOSIS — R2689 Other abnormalities of gait and mobility: Secondary | ICD-10-CM | POA: Diagnosis not present

## 2017-10-05 DIAGNOSIS — G8191 Hemiplegia, unspecified affecting right dominant side: Secondary | ICD-10-CM | POA: Diagnosis not present

## 2017-10-05 DIAGNOSIS — R1310 Dysphagia, unspecified: Secondary | ICD-10-CM | POA: Diagnosis not present

## 2017-10-07 DIAGNOSIS — D509 Iron deficiency anemia, unspecified: Secondary | ICD-10-CM | POA: Diagnosis not present

## 2017-10-07 DIAGNOSIS — S41102A Unspecified open wound of left upper arm, initial encounter: Secondary | ICD-10-CM | POA: Diagnosis not present

## 2017-10-07 DIAGNOSIS — N186 End stage renal disease: Secondary | ICD-10-CM | POA: Diagnosis not present

## 2017-10-07 DIAGNOSIS — Z5181 Encounter for therapeutic drug level monitoring: Secondary | ICD-10-CM | POA: Diagnosis not present

## 2017-10-07 DIAGNOSIS — I248 Other forms of acute ischemic heart disease: Secondary | ICD-10-CM | POA: Diagnosis not present

## 2017-10-07 DIAGNOSIS — Z992 Dependence on renal dialysis: Secondary | ICD-10-CM | POA: Diagnosis not present

## 2017-10-07 DIAGNOSIS — N2581 Secondary hyperparathyroidism of renal origin: Secondary | ICD-10-CM | POA: Diagnosis not present

## 2017-10-09 DIAGNOSIS — F322 Major depressive disorder, single episode, severe without psychotic features: Secondary | ICD-10-CM | POA: Diagnosis not present

## 2017-10-09 DIAGNOSIS — R4189 Other symptoms and signs involving cognitive functions and awareness: Secondary | ICD-10-CM | POA: Diagnosis not present

## 2017-10-09 DIAGNOSIS — N186 End stage renal disease: Secondary | ICD-10-CM | POA: Diagnosis not present

## 2017-10-09 DIAGNOSIS — Z992 Dependence on renal dialysis: Secondary | ICD-10-CM | POA: Diagnosis not present

## 2017-10-09 DIAGNOSIS — N2581 Secondary hyperparathyroidism of renal origin: Secondary | ICD-10-CM | POA: Diagnosis not present

## 2017-10-09 DIAGNOSIS — S41102A Unspecified open wound of left upper arm, initial encounter: Secondary | ICD-10-CM | POA: Diagnosis not present

## 2017-10-09 DIAGNOSIS — D509 Iron deficiency anemia, unspecified: Secondary | ICD-10-CM | POA: Diagnosis not present

## 2017-10-12 DIAGNOSIS — S41102A Unspecified open wound of left upper arm, initial encounter: Secondary | ICD-10-CM | POA: Diagnosis not present

## 2017-10-12 DIAGNOSIS — N2581 Secondary hyperparathyroidism of renal origin: Secondary | ICD-10-CM | POA: Diagnosis not present

## 2017-10-12 DIAGNOSIS — N186 End stage renal disease: Secondary | ICD-10-CM | POA: Diagnosis not present

## 2017-10-12 DIAGNOSIS — D509 Iron deficiency anemia, unspecified: Secondary | ICD-10-CM | POA: Diagnosis not present

## 2017-10-12 DIAGNOSIS — Z992 Dependence on renal dialysis: Secondary | ICD-10-CM | POA: Diagnosis not present

## 2017-10-14 DIAGNOSIS — S41102A Unspecified open wound of left upper arm, initial encounter: Secondary | ICD-10-CM | POA: Diagnosis not present

## 2017-10-14 DIAGNOSIS — D509 Iron deficiency anemia, unspecified: Secondary | ICD-10-CM | POA: Diagnosis not present

## 2017-10-14 DIAGNOSIS — Z992 Dependence on renal dialysis: Secondary | ICD-10-CM | POA: Diagnosis not present

## 2017-10-14 DIAGNOSIS — N186 End stage renal disease: Secondary | ICD-10-CM | POA: Diagnosis not present

## 2017-10-14 DIAGNOSIS — N2581 Secondary hyperparathyroidism of renal origin: Secondary | ICD-10-CM | POA: Diagnosis not present

## 2017-10-15 DIAGNOSIS — I120 Hypertensive chronic kidney disease with stage 5 chronic kidney disease or end stage renal disease: Secondary | ICD-10-CM | POA: Diagnosis not present

## 2017-10-15 DIAGNOSIS — N186 End stage renal disease: Secondary | ICD-10-CM | POA: Diagnosis not present

## 2017-10-15 DIAGNOSIS — Z992 Dependence on renal dialysis: Secondary | ICD-10-CM | POA: Diagnosis not present

## 2017-10-16 DIAGNOSIS — D631 Anemia in chronic kidney disease: Secondary | ICD-10-CM | POA: Diagnosis not present

## 2017-10-16 DIAGNOSIS — N186 End stage renal disease: Secondary | ICD-10-CM | POA: Diagnosis not present

## 2017-10-16 DIAGNOSIS — N2581 Secondary hyperparathyroidism of renal origin: Secondary | ICD-10-CM | POA: Diagnosis not present

## 2017-10-16 DIAGNOSIS — Z992 Dependence on renal dialysis: Secondary | ICD-10-CM | POA: Diagnosis not present

## 2017-10-19 DIAGNOSIS — N186 End stage renal disease: Secondary | ICD-10-CM | POA: Diagnosis not present

## 2017-10-19 DIAGNOSIS — Z992 Dependence on renal dialysis: Secondary | ICD-10-CM | POA: Diagnosis not present

## 2017-10-19 DIAGNOSIS — N2581 Secondary hyperparathyroidism of renal origin: Secondary | ICD-10-CM | POA: Diagnosis not present

## 2017-10-19 DIAGNOSIS — D631 Anemia in chronic kidney disease: Secondary | ICD-10-CM | POA: Diagnosis not present

## 2017-10-21 DIAGNOSIS — M545 Low back pain: Secondary | ICD-10-CM | POA: Diagnosis not present

## 2017-10-21 DIAGNOSIS — D631 Anemia in chronic kidney disease: Secondary | ICD-10-CM | POA: Diagnosis not present

## 2017-10-21 DIAGNOSIS — G8191 Hemiplegia, unspecified affecting right dominant side: Secondary | ICD-10-CM | POA: Diagnosis not present

## 2017-10-21 DIAGNOSIS — I69320 Aphasia following cerebral infarction: Secondary | ICD-10-CM | POA: Diagnosis not present

## 2017-10-21 DIAGNOSIS — R2689 Other abnormalities of gait and mobility: Secondary | ICD-10-CM | POA: Diagnosis not present

## 2017-10-21 DIAGNOSIS — Z992 Dependence on renal dialysis: Secondary | ICD-10-CM | POA: Diagnosis not present

## 2017-10-21 DIAGNOSIS — N186 End stage renal disease: Secondary | ICD-10-CM | POA: Diagnosis not present

## 2017-10-21 DIAGNOSIS — M6249 Contracture of muscle, multiple sites: Secondary | ICD-10-CM | POA: Diagnosis not present

## 2017-10-21 DIAGNOSIS — R1310 Dysphagia, unspecified: Secondary | ICD-10-CM | POA: Diagnosis not present

## 2017-10-21 DIAGNOSIS — N2581 Secondary hyperparathyroidism of renal origin: Secondary | ICD-10-CM | POA: Diagnosis not present

## 2017-10-23 DIAGNOSIS — Z992 Dependence on renal dialysis: Secondary | ICD-10-CM | POA: Diagnosis not present

## 2017-10-23 DIAGNOSIS — N2581 Secondary hyperparathyroidism of renal origin: Secondary | ICD-10-CM | POA: Diagnosis not present

## 2017-10-23 DIAGNOSIS — D631 Anemia in chronic kidney disease: Secondary | ICD-10-CM | POA: Diagnosis not present

## 2017-10-23 DIAGNOSIS — N186 End stage renal disease: Secondary | ICD-10-CM | POA: Diagnosis not present

## 2017-10-26 DIAGNOSIS — N186 End stage renal disease: Secondary | ICD-10-CM | POA: Diagnosis not present

## 2017-10-26 DIAGNOSIS — N2581 Secondary hyperparathyroidism of renal origin: Secondary | ICD-10-CM | POA: Diagnosis not present

## 2017-10-26 DIAGNOSIS — D631 Anemia in chronic kidney disease: Secondary | ICD-10-CM | POA: Diagnosis not present

## 2017-10-26 DIAGNOSIS — Z992 Dependence on renal dialysis: Secondary | ICD-10-CM | POA: Diagnosis not present

## 2017-10-27 ENCOUNTER — Telehealth: Payer: Self-pay | Admitting: Internal Medicine

## 2017-10-27 ENCOUNTER — Other Ambulatory Visit: Payer: Self-pay

## 2017-10-27 MED ORDER — ALLOPURINOL 100 MG PO TABS: 100.0000 mg | ORAL_TABLET | Freq: Every day | ORAL | 3 refills | Status: AC

## 2017-10-27 NOTE — Telephone Encounter (Signed)
Done

## 2017-10-27 NOTE — Telephone Encounter (Signed)
See hardcopy rx for allopurinol  I think pt gets his meds at the [facility] health dept which requires fax (not able to do erx0  Please let pt know that we had to change his uloric to allopurinol due to the urloric (for gout) is now realized to be possibly harmful

## 2017-10-28 ENCOUNTER — Other Ambulatory Visit: Payer: Self-pay

## 2017-10-28 DIAGNOSIS — D631 Anemia in chronic kidney disease: Secondary | ICD-10-CM | POA: Diagnosis not present

## 2017-10-28 DIAGNOSIS — N186 End stage renal disease: Secondary | ICD-10-CM | POA: Diagnosis not present

## 2017-10-28 DIAGNOSIS — Z992 Dependence on renal dialysis: Secondary | ICD-10-CM | POA: Diagnosis not present

## 2017-10-28 DIAGNOSIS — N2581 Secondary hyperparathyroidism of renal origin: Secondary | ICD-10-CM | POA: Diagnosis not present

## 2017-10-28 NOTE — Patient Outreach (Signed)
Telephone outreach to patient to obtain mRS was successfully completed. mRS = 5 

## 2017-10-29 DIAGNOSIS — G8191 Hemiplegia, unspecified affecting right dominant side: Secondary | ICD-10-CM | POA: Diagnosis not present

## 2017-10-29 DIAGNOSIS — I69320 Aphasia following cerebral infarction: Secondary | ICD-10-CM | POA: Diagnosis not present

## 2017-10-29 DIAGNOSIS — R2689 Other abnormalities of gait and mobility: Secondary | ICD-10-CM | POA: Diagnosis not present

## 2017-10-29 DIAGNOSIS — I959 Hypotension, unspecified: Secondary | ICD-10-CM | POA: Diagnosis not present

## 2017-10-29 DIAGNOSIS — R634 Abnormal weight loss: Secondary | ICD-10-CM | POA: Diagnosis not present

## 2017-10-29 DIAGNOSIS — I63512 Cerebral infarction due to unspecified occlusion or stenosis of left middle cerebral artery: Secondary | ICD-10-CM | POA: Diagnosis not present

## 2017-10-29 DIAGNOSIS — K5900 Constipation, unspecified: Secondary | ICD-10-CM | POA: Diagnosis not present

## 2017-10-29 DIAGNOSIS — G4701 Insomnia due to medical condition: Secondary | ICD-10-CM | POA: Diagnosis not present

## 2017-10-29 DIAGNOSIS — M6249 Contracture of muscle, multiple sites: Secondary | ICD-10-CM | POA: Diagnosis not present

## 2017-10-29 DIAGNOSIS — I10 Essential (primary) hypertension: Secondary | ICD-10-CM | POA: Diagnosis not present

## 2017-10-29 DIAGNOSIS — R1310 Dysphagia, unspecified: Secondary | ICD-10-CM | POA: Diagnosis not present

## 2017-10-29 DIAGNOSIS — M545 Low back pain: Secondary | ICD-10-CM | POA: Diagnosis not present

## 2017-10-29 DIAGNOSIS — F0150 Vascular dementia without behavioral disturbance: Secondary | ICD-10-CM | POA: Diagnosis not present

## 2017-10-29 DIAGNOSIS — N186 End stage renal disease: Secondary | ICD-10-CM | POA: Diagnosis not present

## 2017-10-30 DIAGNOSIS — D631 Anemia in chronic kidney disease: Secondary | ICD-10-CM | POA: Diagnosis not present

## 2017-10-30 DIAGNOSIS — N186 End stage renal disease: Secondary | ICD-10-CM | POA: Diagnosis not present

## 2017-10-30 DIAGNOSIS — N2581 Secondary hyperparathyroidism of renal origin: Secondary | ICD-10-CM | POA: Diagnosis not present

## 2017-10-30 DIAGNOSIS — Z992 Dependence on renal dialysis: Secondary | ICD-10-CM | POA: Diagnosis not present

## 2017-10-31 DIAGNOSIS — I69320 Aphasia following cerebral infarction: Secondary | ICD-10-CM | POA: Diagnosis not present

## 2017-11-02 DIAGNOSIS — N186 End stage renal disease: Secondary | ICD-10-CM | POA: Diagnosis not present

## 2017-11-02 DIAGNOSIS — I69320 Aphasia following cerebral infarction: Secondary | ICD-10-CM | POA: Diagnosis not present

## 2017-11-02 DIAGNOSIS — Z992 Dependence on renal dialysis: Secondary | ICD-10-CM | POA: Diagnosis not present

## 2017-11-02 DIAGNOSIS — N2581 Secondary hyperparathyroidism of renal origin: Secondary | ICD-10-CM | POA: Diagnosis not present

## 2017-11-02 DIAGNOSIS — D631 Anemia in chronic kidney disease: Secondary | ICD-10-CM | POA: Diagnosis not present

## 2017-11-03 DIAGNOSIS — I69320 Aphasia following cerebral infarction: Secondary | ICD-10-CM | POA: Diagnosis not present

## 2017-11-04 DIAGNOSIS — N2581 Secondary hyperparathyroidism of renal origin: Secondary | ICD-10-CM | POA: Diagnosis not present

## 2017-11-04 DIAGNOSIS — R012 Other cardiac sounds: Secondary | ICD-10-CM | POA: Diagnosis not present

## 2017-11-04 DIAGNOSIS — Z992 Dependence on renal dialysis: Secondary | ICD-10-CM | POA: Diagnosis not present

## 2017-11-04 DIAGNOSIS — I69320 Aphasia following cerebral infarction: Secondary | ICD-10-CM | POA: Diagnosis not present

## 2017-11-04 DIAGNOSIS — D631 Anemia in chronic kidney disease: Secondary | ICD-10-CM | POA: Diagnosis not present

## 2017-11-04 DIAGNOSIS — I10 Essential (primary) hypertension: Secondary | ICD-10-CM | POA: Diagnosis not present

## 2017-11-04 DIAGNOSIS — N186 End stage renal disease: Secondary | ICD-10-CM | POA: Diagnosis not present

## 2017-11-04 DIAGNOSIS — I69351 Hemiplegia and hemiparesis following cerebral infarction affecting right dominant side: Secondary | ICD-10-CM | POA: Diagnosis not present

## 2017-11-04 DIAGNOSIS — E876 Hypokalemia: Secondary | ICD-10-CM | POA: Diagnosis not present

## 2017-11-06 DIAGNOSIS — D631 Anemia in chronic kidney disease: Secondary | ICD-10-CM | POA: Diagnosis not present

## 2017-11-06 DIAGNOSIS — N2581 Secondary hyperparathyroidism of renal origin: Secondary | ICD-10-CM | POA: Diagnosis not present

## 2017-11-06 DIAGNOSIS — I69320 Aphasia following cerebral infarction: Secondary | ICD-10-CM | POA: Diagnosis not present

## 2017-11-06 DIAGNOSIS — Z992 Dependence on renal dialysis: Secondary | ICD-10-CM | POA: Diagnosis not present

## 2017-11-06 DIAGNOSIS — N186 End stage renal disease: Secondary | ICD-10-CM | POA: Diagnosis not present

## 2017-11-09 DIAGNOSIS — N186 End stage renal disease: Secondary | ICD-10-CM | POA: Diagnosis not present

## 2017-11-09 DIAGNOSIS — I63512 Cerebral infarction due to unspecified occlusion or stenosis of left middle cerebral artery: Secondary | ICD-10-CM | POA: Diagnosis not present

## 2017-11-09 DIAGNOSIS — Z5181 Encounter for therapeutic drug level monitoring: Secondary | ICD-10-CM | POA: Diagnosis not present

## 2017-11-09 DIAGNOSIS — F0150 Vascular dementia without behavioral disturbance: Secondary | ICD-10-CM | POA: Diagnosis not present

## 2017-11-09 DIAGNOSIS — Z992 Dependence on renal dialysis: Secondary | ICD-10-CM | POA: Diagnosis not present

## 2017-11-09 DIAGNOSIS — I69320 Aphasia following cerebral infarction: Secondary | ICD-10-CM | POA: Diagnosis not present

## 2017-11-09 DIAGNOSIS — D631 Anemia in chronic kidney disease: Secondary | ICD-10-CM | POA: Diagnosis not present

## 2017-11-09 DIAGNOSIS — N2581 Secondary hyperparathyroidism of renal origin: Secondary | ICD-10-CM | POA: Diagnosis not present

## 2017-11-09 DIAGNOSIS — I120 Hypertensive chronic kidney disease with stage 5 chronic kidney disease or end stage renal disease: Secondary | ICD-10-CM | POA: Diagnosis not present

## 2017-11-10 ENCOUNTER — Ambulatory Visit: Payer: Medicare Other | Admitting: Internal Medicine

## 2017-11-10 ENCOUNTER — Ambulatory Visit (INDEPENDENT_AMBULATORY_CARE_PROVIDER_SITE_OTHER): Payer: Medicare Other | Admitting: Neurology

## 2017-11-10 ENCOUNTER — Encounter: Payer: Self-pay | Admitting: Neurology

## 2017-11-10 VITALS — BP 133/91 | HR 62 | Ht 72.0 in

## 2017-11-10 DIAGNOSIS — T82858A Stenosis of vascular prosthetic devices, implants and grafts, initial encounter: Secondary | ICD-10-CM | POA: Diagnosis not present

## 2017-11-10 DIAGNOSIS — I69320 Aphasia following cerebral infarction: Secondary | ICD-10-CM | POA: Diagnosis not present

## 2017-11-10 DIAGNOSIS — R4701 Aphasia: Secondary | ICD-10-CM | POA: Diagnosis not present

## 2017-11-10 DIAGNOSIS — G8110 Spastic hemiplegia affecting unspecified side: Secondary | ICD-10-CM

## 2017-11-10 DIAGNOSIS — Z992 Dependence on renal dialysis: Secondary | ICD-10-CM | POA: Diagnosis not present

## 2017-11-10 DIAGNOSIS — N186 End stage renal disease: Secondary | ICD-10-CM | POA: Diagnosis not present

## 2017-11-10 DIAGNOSIS — I639 Cerebral infarction, unspecified: Secondary | ICD-10-CM | POA: Diagnosis not present

## 2017-11-10 DIAGNOSIS — I871 Compression of vein: Secondary | ICD-10-CM | POA: Diagnosis not present

## 2017-11-10 NOTE — Progress Notes (Signed)
[facility] [address] [place]. [place] [address] [phone]       OFFICE FOLLOW-UP NOTE  [name] Date of Birth:  1960-12-29 Medical Record Number:  [id number]   HPI: [name] is a 57 year old African American male with history of CAD/MI s/p stenting and CABG, known cardiomyopathy with EF 35-40%  hypertension, hyperlipidemia, cognitive impairment, obesity, seizure, OSA, ESRD on hemodialysis seen today for initial office follow-up visit following hospital admission for stroke in April 2019. He is accompanied by sister and 8 from nursing home. History is obtained from them as well as review of electronic medical records and I have personally reviewed imaging films.HPI ( Dr Amada Jupiter )   [name] is a 57 y.o. male with a history of ESRD, previous stroke who does live alone presenting with signs of L MCA syndrome. He was alst seen well on Friday, but at 6pm someone talked to him through a door. He said he was getting up to come to the door, but never made it. Unclear if he was actually well at that time. He is a dialysis patient Monday Wednesday Friday and therefore will need dialysis tomorrow. LKW: Friday 07/03/2017 tpa given?: no, out of window.  Modified Rankin Scale: 2-Slight disability-UNABLE to perform all activities but does not need assistance  He underwent emergent CT angiogram of the head and neck which showed 50% left CCA stenosis and a Cutoff of the left M2 and changes of diffuse intracranial atherosclerosis. Digital subtraction angiogram confirmed left M2 occlusion attempted endovascular thrombectomy was not successful despite intra-arterial Integrilin and using a microguidewire by Dr. Corliss Skains who felt that it was likely a chronic hardening occlusion of the inferior division of the left MCA which could not be opened up. Transthoracic echo showed ejection fraction of 40-45% which is slightly improved from previous study but it did show left  ventricular mural thrombus which was felt to be the etiology of the stroke. LDL cholesterol was elevated at 131 mg percent and hemoglobin A1c was 4.6. Patient remained aphasic with dense right hemiplegia but surprisingly was able to swallow. He was seen by physical occupational speech therapy and felt appropriate for transfer to skilled nursing facility. He was not started on warfarin and aspirin as well given history of 3 heart attacks.patient remains in a nursing home and requires 24-hour care and total care. She remains globally aphasic. He can understand only simple midline and occasional one-step commands. He is unable to stand up and walk even with the therapist. The patient's sister feels that he is on too many medications and is asking me to remove some of the unnecessary ones. Patient continues to get dialysis. The patient's sister continues to refuse palliative care  ROS:   14 system review of systems is positive for  Speech difficulty, weakness, gait difficulty and all other systems negative  PMH:  Past Medical History:  Diagnosis Date  . ACHALASIA   . ANEMIA-NOS   . CAD (coronary artery disease), 3 vessel significant disease.    . Cardiomyopathy (HCC) 05/12/2017  . CEREBROVASCULAR ACCIDENT, HX OF   . CHF (congestive heart failure) (HCC)   . CKD (chronic kidney disease) stage 4, GFR 15-29 ml/min (HCC)   . Constipation   . DEGENERATIVE JOINT DISEASE, SHOULDER    knee  . DEPRESSION    2006- not depressed any longer  . Family history of adverse reaction to anesthesia    Mother, hard to awaken  . GERD  no meds required  . Gout    takes Uloric daily  . H/O hiatal hernia   . Headache(784.0)   . Heart attack (HCC)   . History of colon polyps   . History of diastolic dysfunction, grade 2 by echo   . HYPERLIPIDEMIA    takes crestor daily  . Hyperlipidemia   . HYPERTENSION    takes Norvasc,Catapress,Hydralazine,Imdur,and Metoprolol daily  . Impaired glucose tolerance   .  Insomnia    takes Ambien nightly  . NSTEMI (non-ST elevated myocardial infarction), 06/30/11 07/01/2011  . RENAL CALCULUS, HX OF   . RENAL INSUFFICIENCY    12/2013- TThSat  . S/P coronary artery stent placement, PTCA/DES Resolute to mid-LAD via the LIMA graft, and PTCA of apical 95% stenosis 07/05/11 07/04/2011  . S/P PTCA (percutaneous transluminal coronary angioplasty), 06/30/11 07/01/2011  . Seizures (HCC)    INSULIN INDUCED  . Sleep apnea    SLEEP STUDY IN [place] , NO MACHINE YET  1 YEAR  . Stroke (HCC)    93/2005/2006/2007;left sided weakness.  1993 12/2013  . Thyroid disease     Social History:  Social History   Socioeconomic History  . Marital status: Divorced    Spouse name: Not on file  . Number of children: 1  . Years of education: Bachelors  . Highest education level: Not on file  Occupational History  . Occupation: Disabled  Social Needs  . Financial resource strain: Not on file  . Food insecurity:    Worry: Not on file    Inability: Not on file  . Transportation needs:    Medical: Not on file    Non-medical: Not on file  Tobacco Use  . Smoking status: Never Smoker  . Smokeless tobacco: Former Neurosurgeon    Types: Chew  . Tobacco comment: chewed tobacco   Substance and Sexual Activity  . Alcohol use: Yes    Alcohol/week: 2.0 standard drinks    Types: 2 Shots of liquor per week    Comment: rarely; beer  . Drug use: No  . Sexual activity: Not Currently    Partners: Male  Lifestyle  . Physical activity:    Days per week: Not on file    Minutes per session: Not on file  . Stress: Not on file  Relationships  . Social connections:    Talks on phone: Not on file    Gets together: Not on file    Attends religious service: Not on file    Active member of club or organization: Not on file    Attends meetings of clubs or organizations: Not on file    Relationship status: Not on file  . Intimate partner violence:    Fear of current or ex partner: Not on file     Emotionally abused: Not on file    Physically abused: Not on file    Forced sexual activity: Not on file  Other Topics Concern  . Not on file  Social History Narrative   Lives at home alone.   Right-handed.   Occasional caffeine use.    Medications:   Current Outpatient Medications on File Prior to Visit  Medication Sig Dispense Refill  . allopurinol (ZYLOPRIM) 100 MG tablet Take 1 tablet (100 mg total) by mouth daily. 90 tablet 3  . aspirin 81 MG EC tablet Take 1 tablet (81 mg total) by mouth daily. With food 30 tablet 12  . atorvastatin (LIPITOR) 80 MG tablet Take 1 tablet (80 mg total) by  mouth daily. 30 tablet 0  . bisacodyl (FLEET) 10 MG/30ML ENEM Place 10 mg rectally every other day.    . cinacalcet (SENSIPAR) 30 MG tablet Take 3 tablets (90 mg total) by mouth daily with supper. 60 tablet 0  . docusate sodium (COLACE) 100 MG capsule Take 100 mg by mouth 2 (two) times daily.    Marland Kitchen doxercalciferol (HECTOROL) 4 MCG/2ML injection Inject 2.5 mLs (5 mcg total) into the vein every Monday, Wednesday, and Friday with hemodialysis. 2 mL   . febuxostat (ULORIC) 40 MG tablet Take 40 mg by mouth daily.    . fluticasone (FLONASE) 50 MCG/ACT nasal spray Place 1 spray into both nostrils daily.    Marland Kitchen ketoconazole (NIZORAL) 2 % cream Apply 1 application topically daily.    Marland Kitchen lanthanum ([name]) 1000 MG chewable tablet Chew 2 tablets (2,000 mg total) by mouth 3 (three) times daily with meals. Yearly physical due in July must see MD for refills 540 tablet 0  . metoprolol tartrate (LOPRESSOR) 25 MG tablet Take 0.5 tablets (12.5 mg total) by mouth 2 (two) times daily. 30 tablet 1  . nitroGLYCERIN (NITROSTAT) 0.4 MG SL tablet PLACE 1 TABLET UNDER TONGUE AS NEEDED FOR CHEST PAIN EVERY 5 MINUTES (MAX 3 DOSES) 25 tablet 4  . Nutritional Supplements (FEEDING SUPPLEMENT, NEPRO CARB STEADY,) LIQD Take 237 mLs by mouth 2 (two) times daily. 60 Can 3  . ondansetron (ZOFRAN) 4 MG tablet Take 1 tablet (4 mg total) by  mouth every 6 (six) hours as needed for nausea. 20 tablet 0  . promethazine (PHENERGAN) 12.5 MG tablet Take 12.5 mg by mouth every 4 (four) hours as needed for nausea or vomiting.    . senna (SENOKOT) 8.6 MG TABS tablet Take 1 tablet by mouth as needed for mild constipation.    Marland Kitchen terazosin (HYTRIN) 10 MG capsule TAKE 1 CAPSULE BY MOUTH DAILY AT BEDTIME 90 capsule 3  . warfarin (COUMADIN) 1 MG tablet Take 5.5 mg by mouth daily.    Marland Kitchen zolpidem (AMBIEN) 10 MG tablet TAKE ONE TABLET BY MOUTH AT BEDTIME AS NEEDED FOR SLEEP     No current facility-administered medications on file prior to visit.     Allergies:   Allergies  Allergen Reactions  . Shrimp [Shellfish Allergy] Shortness Of Breath  . Atorvastatin Other (See Comments)    weakness  . Insulins Other (See Comments)    Patient unsure as to the kind of insulin but states that it has previously caused seizures.  Most recent hospitalization (Jan 2013) received insulin but did not have reaction. LIKELY DUE TO SIGNIFICANT HYPOGLYCEMIA  . Simvastatin Other (See Comments)     abnormal liver tests  . Ultram [Tramadol] Other (See Comments)    Liver enzyme     Physical Exam General: rail middle-aged African-American male, seated, in no evident distress Head: head normocephalic and atraumatic.  Neck: supple with no carotid or supraclavicular bruits Cardiovascular: regular rate and rhythm, no murmurs Musculoskeletal: no deformity except right foot drop Skin:  no rash/petichiae Vascular:  Normal pulses all extremities Vitals:   11/10/17 1555  BP: (!) 133/91  Pulse: 62   Neurologic Exam Mental Status: Awake and fully alert. Globally aphasic and does not speak any words. Follows only occasional midline commands. Unable to name repeat and comprehend. Cranial Nerves: Fundoscopic exam reveals sharp disc margins. Pupils equal, briskly reactive to light. Extraocular movements full without nystagmus.Decreased blink to threat on the right compared to  the left.Marland Kitchen Hearing intact. Facial  sensation intact. Right lower facial weakness. tongue, palate moves normally and symmetrically.  Motor: spastic right hemiplegia with 0/5 right upper extremity strength with increased tone and non-fixed flexion contracture of the right elbow and wrist and fingers. 2/5 strength in the right lower extremity proximally and right foot drop and ankle dorsiflexors 0/5. No clonus. Normal strength in the left side.   Sensory.: probably intact to touch ,pinprick .position and vibratory sensation but difficult to test because of his aphasia.  Coordination: cannot be tested on the right side and normal on the left Gait and Station: Unable to test as patient is wheelchair-bound and cannot walk even with physical therapist Reflexes: 2+ and asymmetric and brisker on the right side. Toes downgoing.   NIHSS  14 Modified Rankin  4   ASSESSMENT: 57 year old male with large left MCA infarct in April 2019 secondary to embolization from left ventricular mural thrombus with significant residual global aphasia and spastic hemiplegia.     PLAN: I had a long discussion with the patient and his sister and nursing home aide regarding his unfortunate large left MCA infarct with resultant severe global aphasia and spastic hemiplegia. His prognosis for making meaningful improvement now seems quite bleak. The family needs to seriously consider palliative care. I recommend we discontinue Namenda as well as Zanaflex as they seem to be not contributing much to his condition. The patient may need follow-up with a cardiologist to repeat echocardiogram and consider discontinuing warfarin if the left ventricular thrombus is resolved. Continue aspirin and warfarin for stroke prevention and ongoing physical occupational speech therapy services. No routine scheduled follow-up appointment with me is necessary. Greater than 50% of time during this 25 minute visit was spent on counseling,explanation of  diagnosis however aphasia, stroke, planning of further management, discussion with patient and family and coordination of care Delia Heady, MD  [facility] [address] [place], [place] [address]  Phone [phone] Fax [phone] Note: This document was prepared with digital dictation and possible smart phrase technology. Any transcriptional errors that result from this process are unintentional

## 2017-11-10 NOTE — Patient Instructions (Signed)
I had a long discussion with the patient and his sister and nursing home aide regarding his unfortunate large left MCA infarct with resultant severe global aphasia and spastic hemiplegia. His prognosis for making meaningful improvement now seems quite bleak. The family needs to seriously consider palliative care. I recommend we discontinue Namenda as well as Zanaflex as they seem to be not contributing much to his condition. The patient may need follow-up with a cardiologist to repeat echocardiogram and consider discontinuing warfarin if the left ventricular thrombus is resolved. Continue aspirin and warfarin for stroke prevention and ongoing physical occupational speech therapy services. No routine scheduled follow-up appointment with me is necessary.

## 2017-11-11 DIAGNOSIS — Z5181 Encounter for therapeutic drug level monitoring: Secondary | ICD-10-CM | POA: Diagnosis not present

## 2017-11-11 DIAGNOSIS — N2581 Secondary hyperparathyroidism of renal origin: Secondary | ICD-10-CM | POA: Diagnosis not present

## 2017-11-11 DIAGNOSIS — D631 Anemia in chronic kidney disease: Secondary | ICD-10-CM | POA: Diagnosis not present

## 2017-11-11 DIAGNOSIS — I248 Other forms of acute ischemic heart disease: Secondary | ICD-10-CM | POA: Diagnosis not present

## 2017-11-11 DIAGNOSIS — Z992 Dependence on renal dialysis: Secondary | ICD-10-CM | POA: Diagnosis not present

## 2017-11-11 DIAGNOSIS — N186 End stage renal disease: Secondary | ICD-10-CM | POA: Diagnosis not present

## 2017-11-11 DIAGNOSIS — I69320 Aphasia following cerebral infarction: Secondary | ICD-10-CM | POA: Diagnosis not present

## 2017-11-12 DIAGNOSIS — I69320 Aphasia following cerebral infarction: Secondary | ICD-10-CM | POA: Diagnosis not present

## 2017-11-12 NOTE — Addendum Note (Signed)
Addended by: Mliss Fritz on: 11/12/2017 08:52 AM   Modules accepted: Orders

## 2017-11-13 DIAGNOSIS — Z992 Dependence on renal dialysis: Secondary | ICD-10-CM | POA: Diagnosis not present

## 2017-11-13 DIAGNOSIS — N2581 Secondary hyperparathyroidism of renal origin: Secondary | ICD-10-CM | POA: Diagnosis not present

## 2017-11-13 DIAGNOSIS — S41102A Unspecified open wound of left upper arm, initial encounter: Secondary | ICD-10-CM | POA: Diagnosis not present

## 2017-11-13 DIAGNOSIS — D631 Anemia in chronic kidney disease: Secondary | ICD-10-CM | POA: Diagnosis not present

## 2017-11-13 DIAGNOSIS — N186 End stage renal disease: Secondary | ICD-10-CM | POA: Diagnosis not present

## 2017-11-13 DIAGNOSIS — I69320 Aphasia following cerebral infarction: Secondary | ICD-10-CM | POA: Diagnosis not present

## 2017-11-15 DIAGNOSIS — Z992 Dependence on renal dialysis: Secondary | ICD-10-CM | POA: Diagnosis not present

## 2017-11-15 DIAGNOSIS — I120 Hypertensive chronic kidney disease with stage 5 chronic kidney disease or end stage renal disease: Secondary | ICD-10-CM | POA: Diagnosis not present

## 2017-11-15 DIAGNOSIS — N186 End stage renal disease: Secondary | ICD-10-CM | POA: Diagnosis not present

## 2017-11-16 DIAGNOSIS — I63512 Cerebral infarction due to unspecified occlusion or stenosis of left middle cerebral artery: Secondary | ICD-10-CM | POA: Diagnosis not present

## 2017-11-16 DIAGNOSIS — N2581 Secondary hyperparathyroidism of renal origin: Secondary | ICD-10-CM | POA: Diagnosis not present

## 2017-11-16 DIAGNOSIS — Z5181 Encounter for therapeutic drug level monitoring: Secondary | ICD-10-CM | POA: Diagnosis not present

## 2017-11-16 DIAGNOSIS — N186 End stage renal disease: Secondary | ICD-10-CM | POA: Diagnosis not present

## 2017-11-16 DIAGNOSIS — Z7901 Long term (current) use of anticoagulants: Secondary | ICD-10-CM | POA: Diagnosis not present

## 2017-11-16 DIAGNOSIS — S41102A Unspecified open wound of left upper arm, initial encounter: Secondary | ICD-10-CM | POA: Diagnosis not present

## 2017-11-16 DIAGNOSIS — R2681 Unsteadiness on feet: Secondary | ICD-10-CM | POA: Diagnosis not present

## 2017-11-16 DIAGNOSIS — M6281 Muscle weakness (generalized): Secondary | ICD-10-CM | POA: Diagnosis not present

## 2017-11-17 DIAGNOSIS — R2681 Unsteadiness on feet: Secondary | ICD-10-CM | POA: Diagnosis not present

## 2017-11-17 DIAGNOSIS — M6281 Muscle weakness (generalized): Secondary | ICD-10-CM | POA: Diagnosis not present

## 2017-11-18 DIAGNOSIS — S41102A Unspecified open wound of left upper arm, initial encounter: Secondary | ICD-10-CM | POA: Diagnosis not present

## 2017-11-18 DIAGNOSIS — M6281 Muscle weakness (generalized): Secondary | ICD-10-CM | POA: Diagnosis not present

## 2017-11-18 DIAGNOSIS — R2681 Unsteadiness on feet: Secondary | ICD-10-CM | POA: Diagnosis not present

## 2017-11-18 DIAGNOSIS — N186 End stage renal disease: Secondary | ICD-10-CM | POA: Diagnosis not present

## 2017-11-18 DIAGNOSIS — N2581 Secondary hyperparathyroidism of renal origin: Secondary | ICD-10-CM | POA: Diagnosis not present

## 2017-11-19 DIAGNOSIS — M6281 Muscle weakness (generalized): Secondary | ICD-10-CM | POA: Diagnosis not present

## 2017-11-19 DIAGNOSIS — R2681 Unsteadiness on feet: Secondary | ICD-10-CM | POA: Diagnosis not present

## 2017-11-20 DIAGNOSIS — R2681 Unsteadiness on feet: Secondary | ICD-10-CM | POA: Diagnosis not present

## 2017-11-20 DIAGNOSIS — S41102A Unspecified open wound of left upper arm, initial encounter: Secondary | ICD-10-CM | POA: Diagnosis not present

## 2017-11-20 DIAGNOSIS — N186 End stage renal disease: Secondary | ICD-10-CM | POA: Diagnosis not present

## 2017-11-20 DIAGNOSIS — M6281 Muscle weakness (generalized): Secondary | ICD-10-CM | POA: Diagnosis not present

## 2017-11-20 DIAGNOSIS — N2581 Secondary hyperparathyroidism of renal origin: Secondary | ICD-10-CM | POA: Diagnosis not present

## 2017-11-21 DIAGNOSIS — Z992 Dependence on renal dialysis: Secondary | ICD-10-CM | POA: Diagnosis not present

## 2017-11-21 DIAGNOSIS — I63512 Cerebral infarction due to unspecified occlusion or stenosis of left middle cerebral artery: Secondary | ICD-10-CM | POA: Diagnosis not present

## 2017-11-21 DIAGNOSIS — N186 End stage renal disease: Secondary | ICD-10-CM | POA: Diagnosis not present

## 2017-11-21 DIAGNOSIS — I120 Hypertensive chronic kidney disease with stage 5 chronic kidney disease or end stage renal disease: Secondary | ICD-10-CM | POA: Diagnosis not present

## 2017-11-23 DIAGNOSIS — R2681 Unsteadiness on feet: Secondary | ICD-10-CM | POA: Diagnosis not present

## 2017-11-23 DIAGNOSIS — M6281 Muscle weakness (generalized): Secondary | ICD-10-CM | POA: Diagnosis not present

## 2017-11-23 DIAGNOSIS — N186 End stage renal disease: Secondary | ICD-10-CM | POA: Diagnosis not present

## 2017-11-23 DIAGNOSIS — N2581 Secondary hyperparathyroidism of renal origin: Secondary | ICD-10-CM | POA: Diagnosis not present

## 2017-11-23 DIAGNOSIS — S41102A Unspecified open wound of left upper arm, initial encounter: Secondary | ICD-10-CM | POA: Diagnosis not present

## 2017-11-24 DIAGNOSIS — R2681 Unsteadiness on feet: Secondary | ICD-10-CM | POA: Diagnosis not present

## 2017-11-24 DIAGNOSIS — M6281 Muscle weakness (generalized): Secondary | ICD-10-CM | POA: Diagnosis not present

## 2017-11-25 DIAGNOSIS — R2681 Unsteadiness on feet: Secondary | ICD-10-CM | POA: Diagnosis not present

## 2017-11-25 DIAGNOSIS — M6281 Muscle weakness (generalized): Secondary | ICD-10-CM | POA: Diagnosis not present

## 2017-11-26 DIAGNOSIS — R2681 Unsteadiness on feet: Secondary | ICD-10-CM | POA: Diagnosis not present

## 2017-11-26 DIAGNOSIS — M6281 Muscle weakness (generalized): Secondary | ICD-10-CM | POA: Diagnosis not present

## 2017-11-27 DIAGNOSIS — F322 Major depressive disorder, single episode, severe without psychotic features: Secondary | ICD-10-CM | POA: Diagnosis not present

## 2017-11-27 DIAGNOSIS — M6281 Muscle weakness (generalized): Secondary | ICD-10-CM | POA: Diagnosis not present

## 2017-11-27 DIAGNOSIS — R2681 Unsteadiness on feet: Secondary | ICD-10-CM | POA: Diagnosis not present

## 2017-11-29 DIAGNOSIS — N186 End stage renal disease: Secondary | ICD-10-CM | POA: Diagnosis not present

## 2017-11-29 DIAGNOSIS — Z992 Dependence on renal dialysis: Secondary | ICD-10-CM | POA: Diagnosis not present

## 2017-11-29 DIAGNOSIS — I63512 Cerebral infarction due to unspecified occlusion or stenosis of left middle cerebral artery: Secondary | ICD-10-CM | POA: Diagnosis not present

## 2017-11-29 DIAGNOSIS — I120 Hypertensive chronic kidney disease with stage 5 chronic kidney disease or end stage renal disease: Secondary | ICD-10-CM | POA: Diagnosis not present

## 2017-11-30 DIAGNOSIS — Z992 Dependence on renal dialysis: Secondary | ICD-10-CM | POA: Diagnosis not present

## 2017-11-30 DIAGNOSIS — I120 Hypertensive chronic kidney disease with stage 5 chronic kidney disease or end stage renal disease: Secondary | ICD-10-CM | POA: Diagnosis not present

## 2017-11-30 DIAGNOSIS — R2681 Unsteadiness on feet: Secondary | ICD-10-CM | POA: Diagnosis not present

## 2017-11-30 DIAGNOSIS — G8191 Hemiplegia, unspecified affecting right dominant side: Secondary | ICD-10-CM | POA: Diagnosis not present

## 2017-11-30 DIAGNOSIS — I69320 Aphasia following cerebral infarction: Secondary | ICD-10-CM | POA: Diagnosis not present

## 2017-11-30 DIAGNOSIS — R1310 Dysphagia, unspecified: Secondary | ICD-10-CM | POA: Diagnosis not present

## 2017-11-30 DIAGNOSIS — R2689 Other abnormalities of gait and mobility: Secondary | ICD-10-CM | POA: Diagnosis not present

## 2017-11-30 DIAGNOSIS — G8929 Other chronic pain: Secondary | ICD-10-CM | POA: Diagnosis not present

## 2017-11-30 DIAGNOSIS — I63512 Cerebral infarction due to unspecified occlusion or stenosis of left middle cerebral artery: Secondary | ICD-10-CM | POA: Diagnosis not present

## 2017-11-30 DIAGNOSIS — M6281 Muscle weakness (generalized): Secondary | ICD-10-CM | POA: Diagnosis not present

## 2017-11-30 DIAGNOSIS — R4701 Aphasia: Secondary | ICD-10-CM | POA: Diagnosis not present

## 2017-11-30 DIAGNOSIS — M6249 Contracture of muscle, multiple sites: Secondary | ICD-10-CM | POA: Diagnosis not present

## 2017-12-01 ENCOUNTER — Ambulatory Visit: Payer: Medicare Other | Admitting: Vascular Surgery

## 2017-12-01 DIAGNOSIS — I959 Hypotension, unspecified: Secondary | ICD-10-CM | POA: Diagnosis not present

## 2017-12-01 DIAGNOSIS — M6281 Muscle weakness (generalized): Secondary | ICD-10-CM | POA: Diagnosis not present

## 2017-12-01 DIAGNOSIS — R2681 Unsteadiness on feet: Secondary | ICD-10-CM | POA: Diagnosis not present

## 2017-12-01 DIAGNOSIS — N186 End stage renal disease: Secondary | ICD-10-CM | POA: Diagnosis not present

## 2017-12-02 ENCOUNTER — Other Ambulatory Visit: Payer: Self-pay | Admitting: Licensed Clinical Social Worker

## 2017-12-02 DIAGNOSIS — M6281 Muscle weakness (generalized): Secondary | ICD-10-CM | POA: Diagnosis not present

## 2017-12-02 DIAGNOSIS — R2681 Unsteadiness on feet: Secondary | ICD-10-CM | POA: Diagnosis not present

## 2017-12-02 NOTE — Patient Outreach (Signed)
Assessment:  CSW called [facility] on 12/02/17 and spoke with Nadine Counts, receptionist, at [facility] facility. CSW inquired if client was still at patient at that facility. Nadine Counts reported to CSW on 12/02/17 that client was still receiving ongoing care at [facility].   CSW had first visited client on 07/21/17 at [facility] facility.  Client continues to receive ongoing care at [facility] facility  to address his current needs. Thus, CSW is discharging [name] from [facility] CSW services on 12/02/17 since client plans to remain at [facility] facility for long term care.   Plan:  CSW is discharging client from [facility] CSW services on 12/02/17 since client plans to remain at nursing center for long term care.  Kelton Pillar.Jaquel Coomer MSW, LCSW Licensed Clinical Social Worker [facility] [phone]

## 2017-12-03 ENCOUNTER — Other Ambulatory Visit: Payer: Self-pay | Admitting: Internal Medicine

## 2017-12-03 DIAGNOSIS — I5042 Chronic combined systolic (congestive) and diastolic (congestive) heart failure: Secondary | ICD-10-CM | POA: Diagnosis not present

## 2017-12-03 DIAGNOSIS — K219 Gastro-esophageal reflux disease without esophagitis: Secondary | ICD-10-CM | POA: Diagnosis not present

## 2017-12-03 DIAGNOSIS — I252 Old myocardial infarction: Secondary | ICD-10-CM | POA: Diagnosis not present

## 2017-12-03 DIAGNOSIS — I429 Cardiomyopathy, unspecified: Secondary | ICD-10-CM | POA: Diagnosis not present

## 2017-12-03 DIAGNOSIS — N186 End stage renal disease: Secondary | ICD-10-CM | POA: Diagnosis not present

## 2017-12-03 DIAGNOSIS — I132 Hypertensive heart and chronic kidney disease with heart failure and with stage 5 chronic kidney disease, or end stage renal disease: Secondary | ICD-10-CM | POA: Diagnosis not present

## 2017-12-03 DIAGNOSIS — M6281 Muscle weakness (generalized): Secondary | ICD-10-CM | POA: Diagnosis not present

## 2017-12-03 DIAGNOSIS — Z8719 Personal history of other diseases of the digestive system: Secondary | ICD-10-CM | POA: Diagnosis not present

## 2017-12-03 DIAGNOSIS — D631 Anemia in chronic kidney disease: Secondary | ICD-10-CM | POA: Diagnosis not present

## 2017-12-03 DIAGNOSIS — I2510 Atherosclerotic heart disease of native coronary artery without angina pectoris: Secondary | ICD-10-CM | POA: Diagnosis not present

## 2017-12-03 DIAGNOSIS — G4733 Obstructive sleep apnea (adult) (pediatric): Secondary | ICD-10-CM | POA: Diagnosis not present

## 2017-12-03 DIAGNOSIS — E785 Hyperlipidemia, unspecified: Secondary | ICD-10-CM | POA: Diagnosis not present

## 2017-12-03 DIAGNOSIS — R2681 Unsteadiness on feet: Secondary | ICD-10-CM | POA: Diagnosis not present

## 2017-12-03 DIAGNOSIS — F3289 Other specified depressive episodes: Secondary | ICD-10-CM | POA: Diagnosis not present

## 2017-12-03 DIAGNOSIS — K222 Esophageal obstruction: Secondary | ICD-10-CM | POA: Diagnosis not present

## 2017-12-03 DIAGNOSIS — M1991 Primary osteoarthritis, unspecified site: Secondary | ICD-10-CM | POA: Diagnosis not present

## 2017-12-03 DIAGNOSIS — M109 Gout, unspecified: Secondary | ICD-10-CM | POA: Diagnosis not present

## 2017-12-03 DIAGNOSIS — I69351 Hemiplegia and hemiparesis following cerebral infarction affecting right dominant side: Secondary | ICD-10-CM | POA: Diagnosis not present

## 2017-12-04 DIAGNOSIS — I120 Hypertensive chronic kidney disease with stage 5 chronic kidney disease or end stage renal disease: Secondary | ICD-10-CM | POA: Diagnosis not present

## 2017-12-04 DIAGNOSIS — I5042 Chronic combined systolic (congestive) and diastolic (congestive) heart failure: Secondary | ICD-10-CM | POA: Diagnosis not present

## 2017-12-04 DIAGNOSIS — I252 Old myocardial infarction: Secondary | ICD-10-CM | POA: Diagnosis not present

## 2017-12-04 DIAGNOSIS — M6281 Muscle weakness (generalized): Secondary | ICD-10-CM | POA: Diagnosis not present

## 2017-12-04 DIAGNOSIS — D631 Anemia in chronic kidney disease: Secondary | ICD-10-CM | POA: Diagnosis not present

## 2017-12-04 DIAGNOSIS — I69351 Hemiplegia and hemiparesis following cerebral infarction affecting right dominant side: Secondary | ICD-10-CM | POA: Diagnosis not present

## 2017-12-04 DIAGNOSIS — N186 End stage renal disease: Secondary | ICD-10-CM | POA: Diagnosis not present

## 2017-12-04 DIAGNOSIS — R2681 Unsteadiness on feet: Secondary | ICD-10-CM | POA: Diagnosis not present

## 2017-12-04 DIAGNOSIS — I132 Hypertensive heart and chronic kidney disease with heart failure and with stage 5 chronic kidney disease, or end stage renal disease: Secondary | ICD-10-CM | POA: Diagnosis not present

## 2017-12-04 DIAGNOSIS — I2510 Atherosclerotic heart disease of native coronary artery without angina pectoris: Secondary | ICD-10-CM | POA: Diagnosis not present

## 2017-12-04 DIAGNOSIS — R4189 Other symptoms and signs involving cognitive functions and awareness: Secondary | ICD-10-CM | POA: Diagnosis not present

## 2017-12-04 NOTE — Telephone Encounter (Signed)
Done erx 

## 2017-12-07 DIAGNOSIS — I2510 Atherosclerotic heart disease of native coronary artery without angina pectoris: Secondary | ICD-10-CM | POA: Diagnosis not present

## 2017-12-07 DIAGNOSIS — I120 Hypertensive chronic kidney disease with stage 5 chronic kidney disease or end stage renal disease: Secondary | ICD-10-CM | POA: Diagnosis not present

## 2017-12-07 DIAGNOSIS — I132 Hypertensive heart and chronic kidney disease with heart failure and with stage 5 chronic kidney disease, or end stage renal disease: Secondary | ICD-10-CM | POA: Diagnosis not present

## 2017-12-07 DIAGNOSIS — F0150 Vascular dementia without behavioral disturbance: Secondary | ICD-10-CM | POA: Diagnosis not present

## 2017-12-07 DIAGNOSIS — I63512 Cerebral infarction due to unspecified occlusion or stenosis of left middle cerebral artery: Secondary | ICD-10-CM | POA: Diagnosis not present

## 2017-12-07 DIAGNOSIS — N186 End stage renal disease: Secondary | ICD-10-CM | POA: Diagnosis not present

## 2017-12-07 DIAGNOSIS — I252 Old myocardial infarction: Secondary | ICD-10-CM | POA: Diagnosis not present

## 2017-12-07 DIAGNOSIS — I5042 Chronic combined systolic (congestive) and diastolic (congestive) heart failure: Secondary | ICD-10-CM | POA: Diagnosis not present

## 2017-12-07 DIAGNOSIS — D631 Anemia in chronic kidney disease: Secondary | ICD-10-CM | POA: Diagnosis not present

## 2017-12-15 DEATH — deceased

## 2017-12-29 ENCOUNTER — Encounter: Payer: Medicare Other | Admitting: Psychology

## 2018-01-28 ENCOUNTER — Encounter: Payer: Medicare Other | Admitting: Psychology

## 2018-07-13 IMAGING — CT CT HEAD W/O CM
4 series · 16 of 47 positions shown, 18 images · non-contrast
Comparison: 07/06/2017 CT head.

CLINICAL DATA: 57 y/o M; stroke with sudden increase in blood
pressure.

EXAM:
CT HEAD WITHOUT CONTRAST
TECHNIQUE: Contiguous axial images were obtained from the base of the skull
through the vertex without intravenous contrast.

[Series 3: head wo · axial · 0.45mm/px · z∈[-66,+54]mm · 7 of 32 slices shown, 9 images]
[im 4/32  brain]
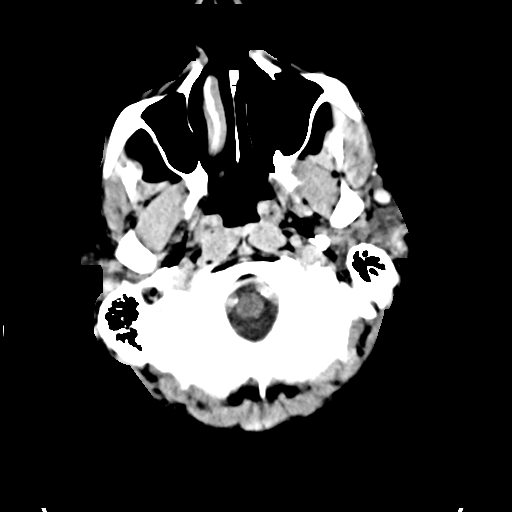
[im 4/32  bone]
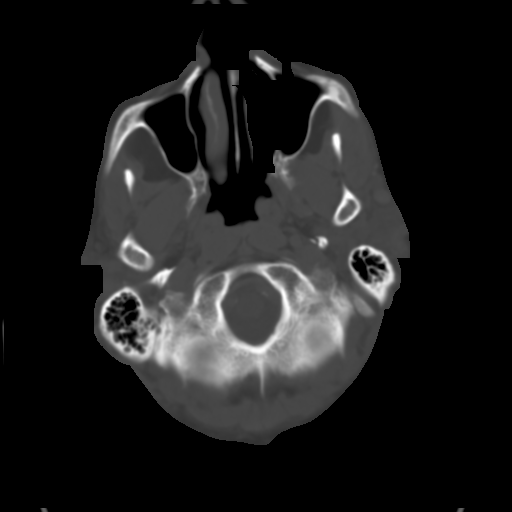
[im 8/32  brain]
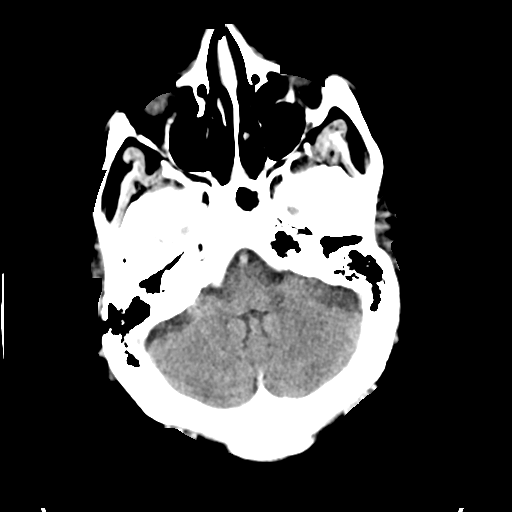
[im 12/32  brain]
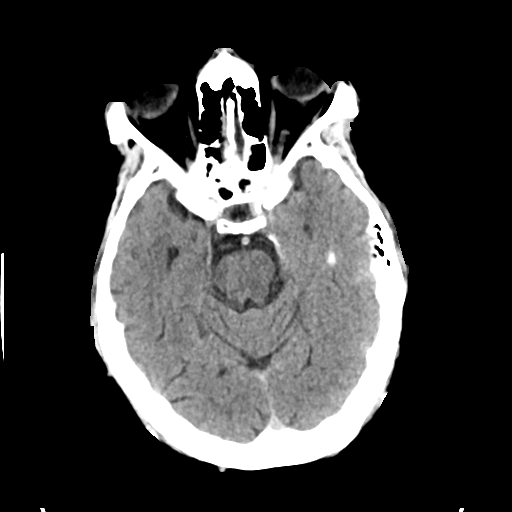
[im 16/32  brain]
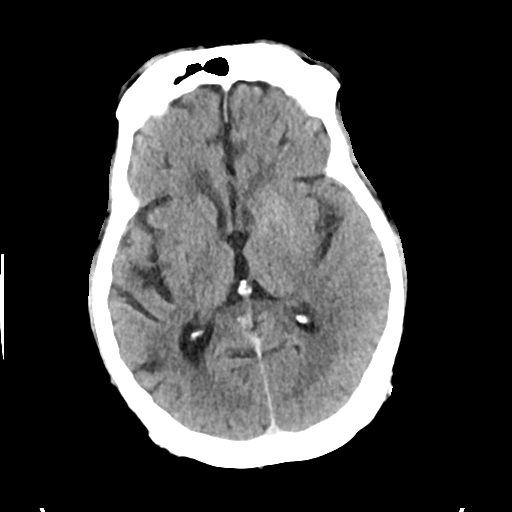
[im 20/32  brain]
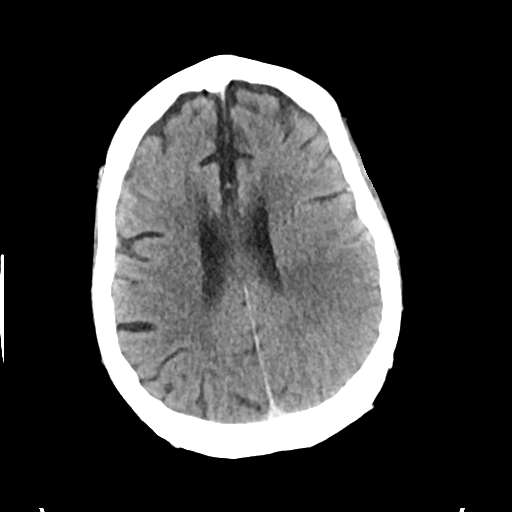
[im 20/32  bone]
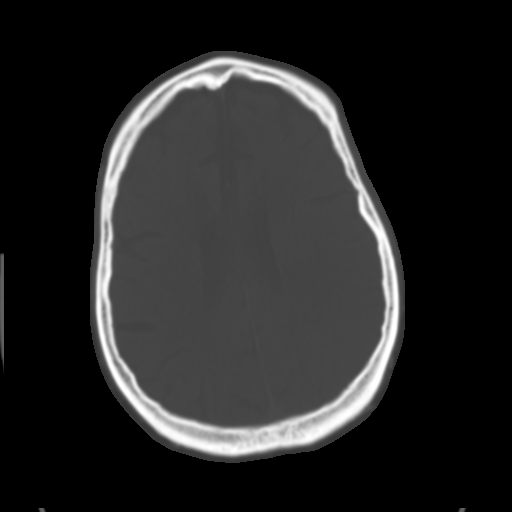
[im 24/32  brain]
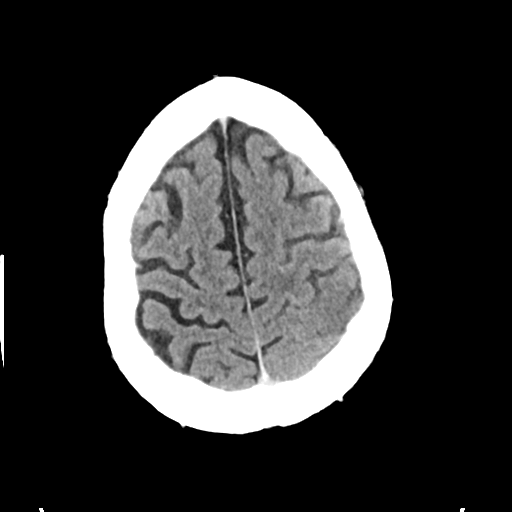
[im 28/32  brain]
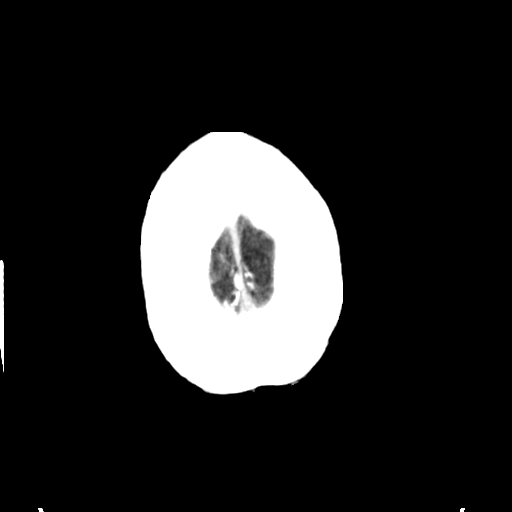

[Series 4: head bone · axial · 0.45mm/px · z∈[-66,-34]mm · 3 of 79 slices shown]
[im 8/79  bone]
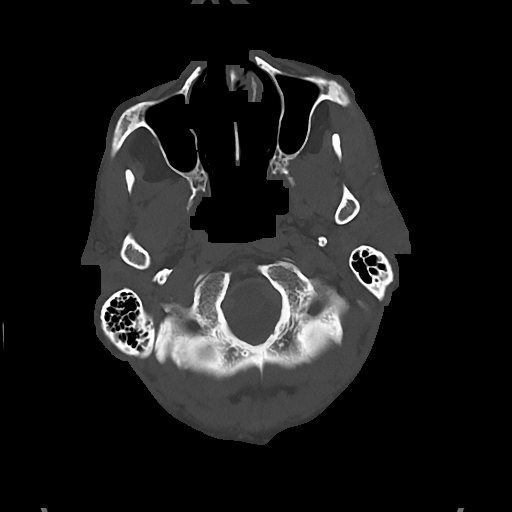
[im 16/79  bone]
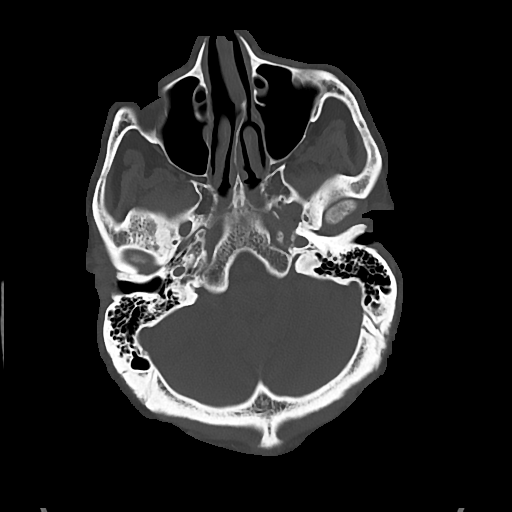
[im 24/79  bone]
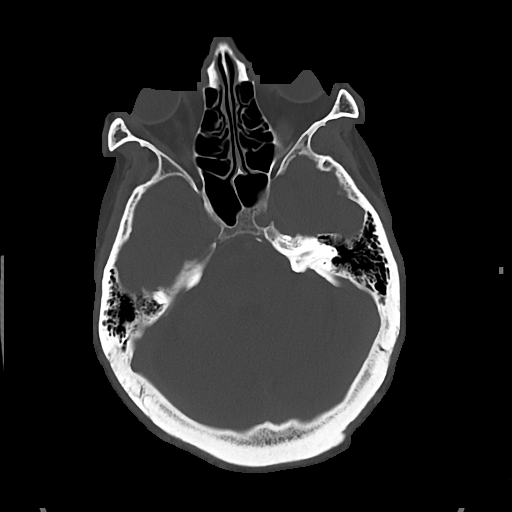

[Series 5: cor soft · coronal · 0.32mm/px · 3 of 72 slices shown]
[im 24/72  brain]
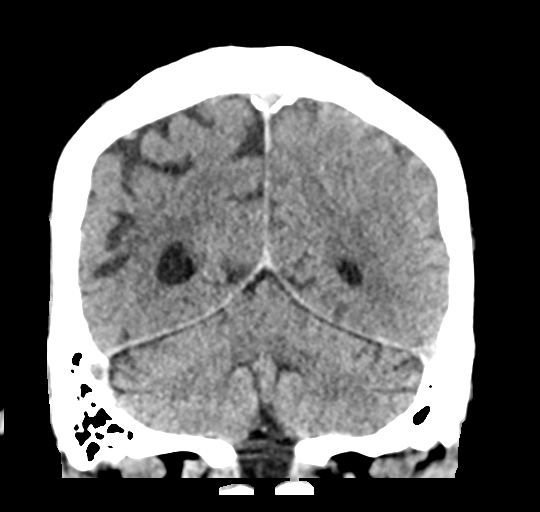
[im 32/72  brain]
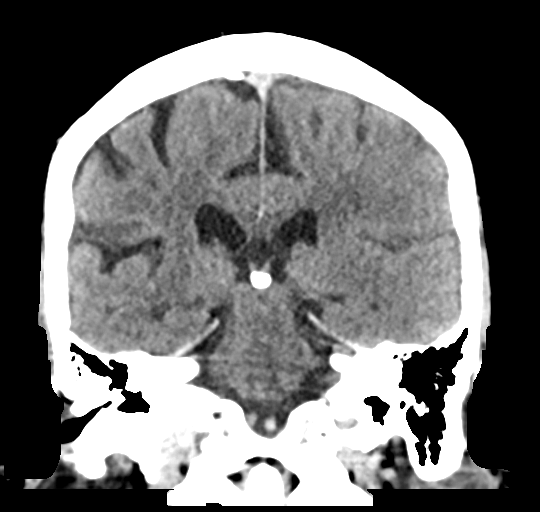
[im 40/72  brain]
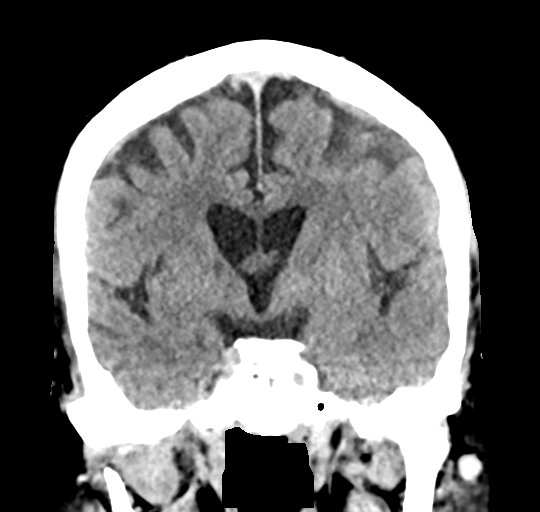

[Series 6: sag soft · sagittal · 0.32mm/px · 3 of 58 slices shown]
[im 20/58  brain]
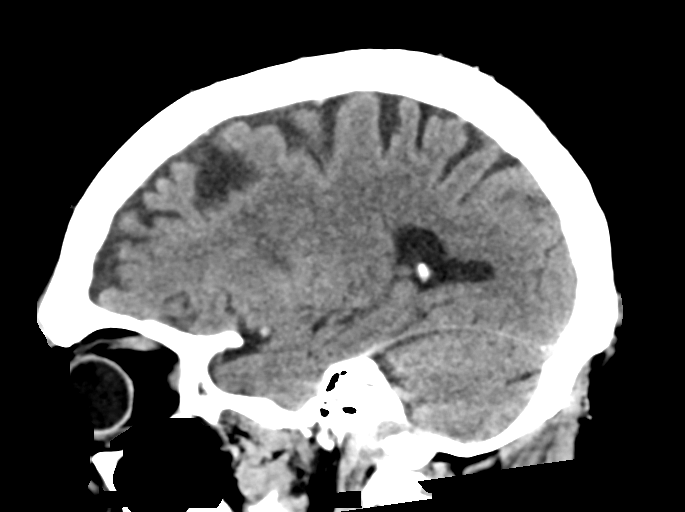
[im 29/58  brain]
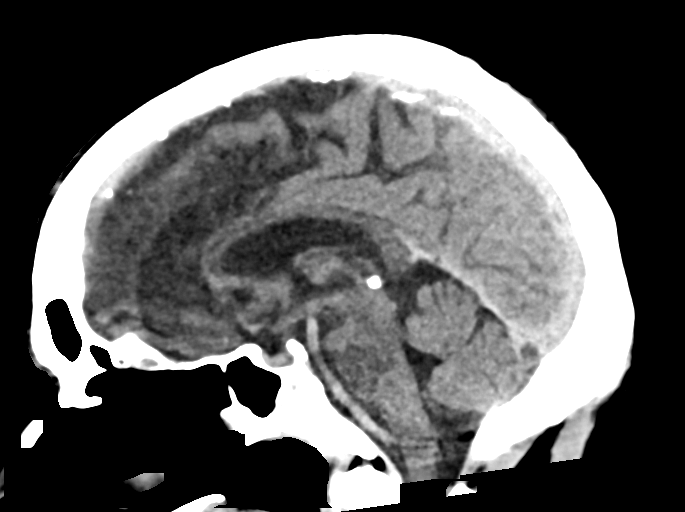
[im 39/58  brain]
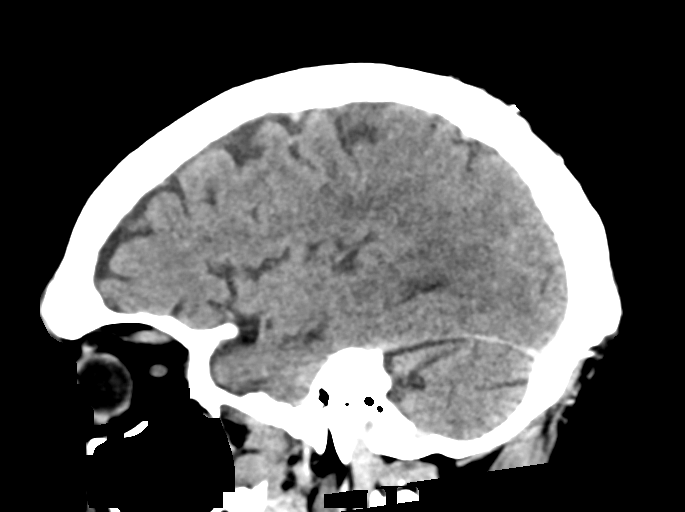

[16 of 47 positions shown; findings below may reference images not displayed]

FINDINGS: Brain: Small area of loss of gray-white differentiation within the
left parietal lobe is similar in distribution comparison with area
of infarction on MRI and is compatible with evolving late acute to
early subacute infarction. No new area of acute infarction,
hemorrhage, or focal mass effect identified. Stable small chronic
infarction in the right posterior temporal lobe, left hemi pons,
left inferior cerebellum, and lacunar infarctions within bilateral
anterior basal ganglia. Stable background of chronic microvascular
ischemic changes and parenchymal volume loss of the brain.

Vascular: Persistent contrast within the venous and arterial
systems.

Skull: Normal. Negative for fracture or focal lesion.

Sinuses/Orbits: No acute finding.

Other: None.
IMPRESSION: 1. Left parietal region of acute infarction is stable in comparison
with prior MRI given differences in technique. No new acute
intracranial abnormality identified.
2. Stable background of chronic infarctions, microvascular ischemic
changes, parenchymal volume loss of the brain.

By: Nalani Brunner M.D.

## 2018-07-19 IMAGING — CT CT HEAD W/O CM
4 series · 16 of 47 positions shown, 18 images · non-contrast
Comparison: Prior CT from 07/08/2017.

CLINICAL DATA: Follow-up examination for stroke.

EXAM:
CT HEAD WITHOUT CONTRAST
TECHNIQUE: Contiguous axial images were obtained from the base of the skull
through the vertex without intravenous contrast.

[Series 3: head wo · axial · 0.40mm/px · z∈[-584,-464]mm · 7 of 33 slices shown, 9 images]
[im 5/33  brain]
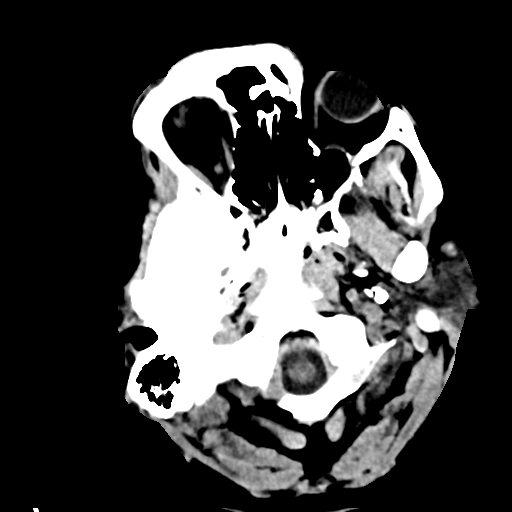
[im 5/33  bone]
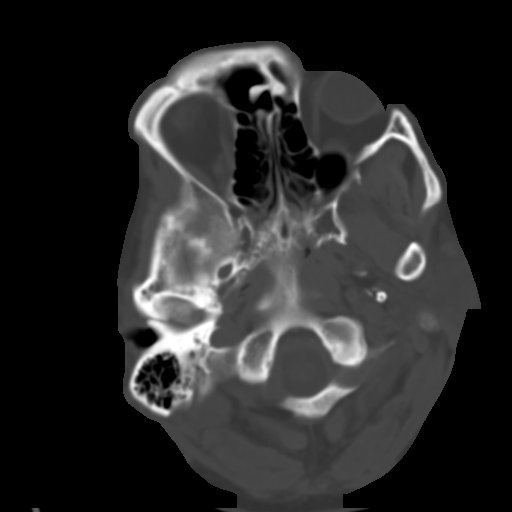
[im 9/33  brain]
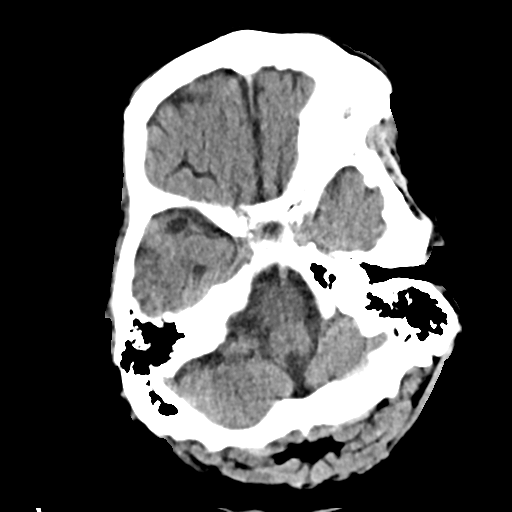
[im 13/33  brain]
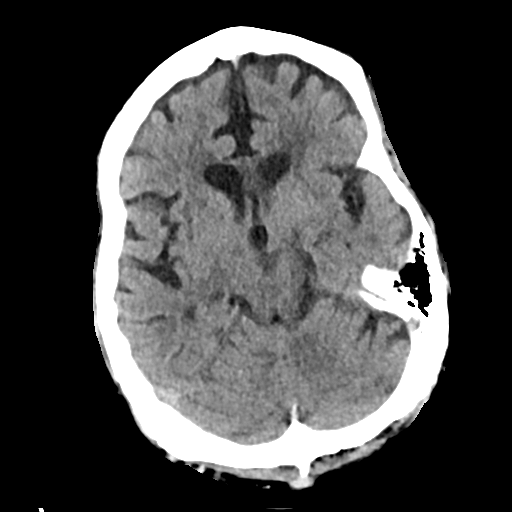
[im 17/33  brain]
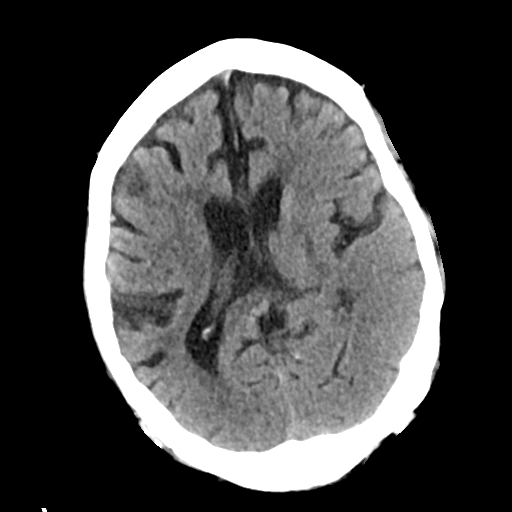
[im 21/33  brain]
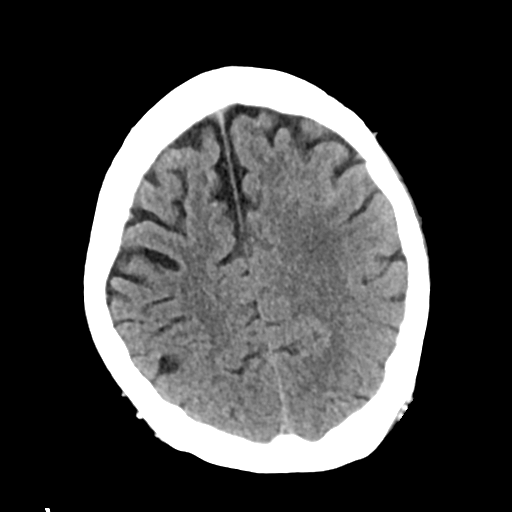
[im 21/33  bone]
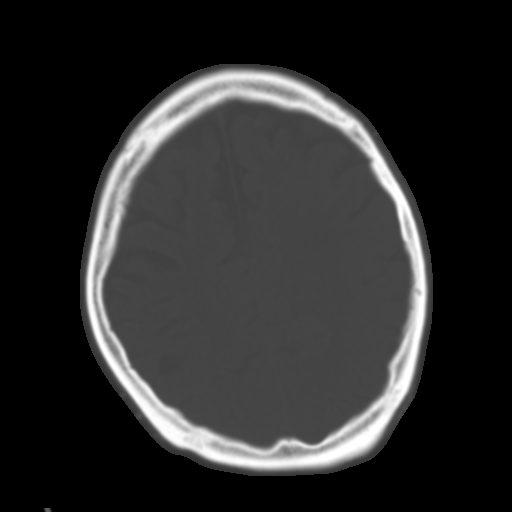
[im 25/33  brain]
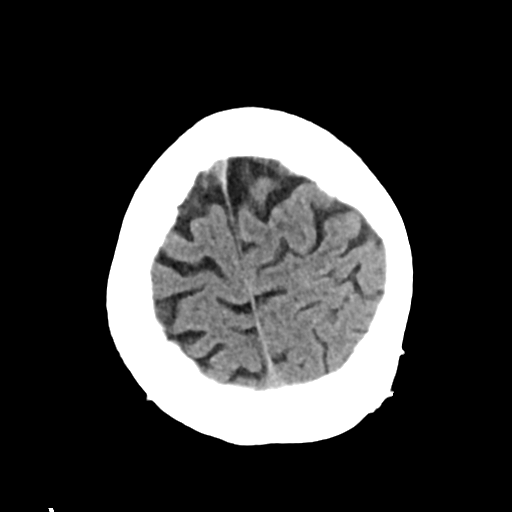
[im 29/33  brain]
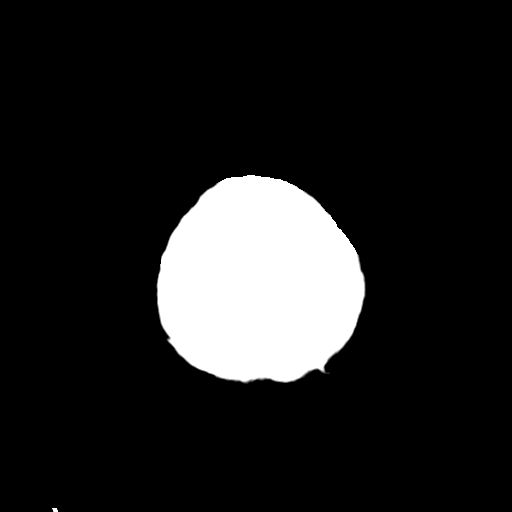

[Series 4: head bone · axial · 0.40mm/px · z∈[-588,-556]mm · 3 of 82 slices shown]
[im 9/82  bone]
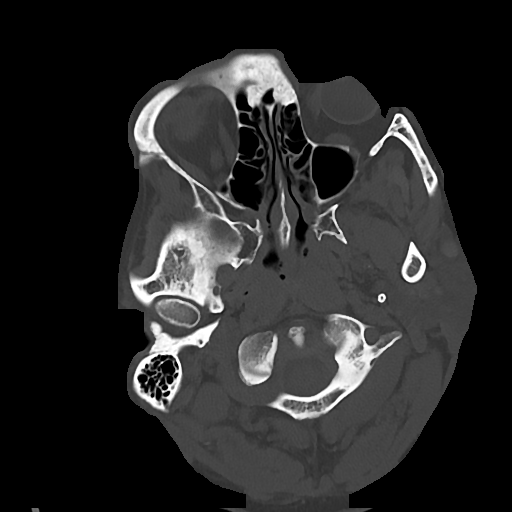
[im 17/82  bone]
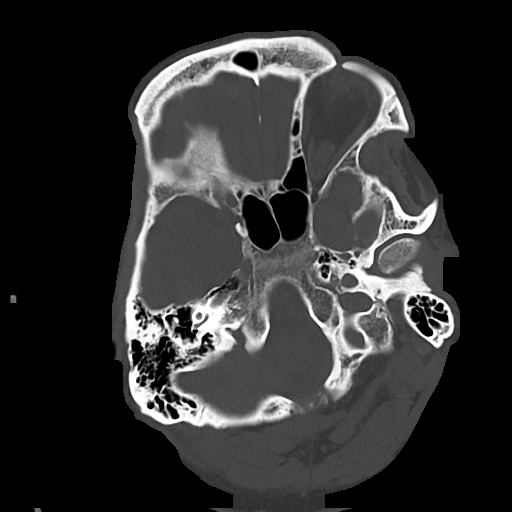
[im 25/82  bone]
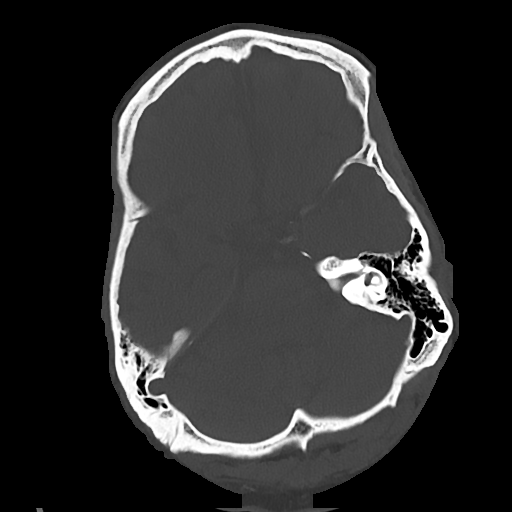

[Series 5: cor soft · coronal · 0.32mm/px · 3 of 67 slices shown]
[im 23/67  brain]
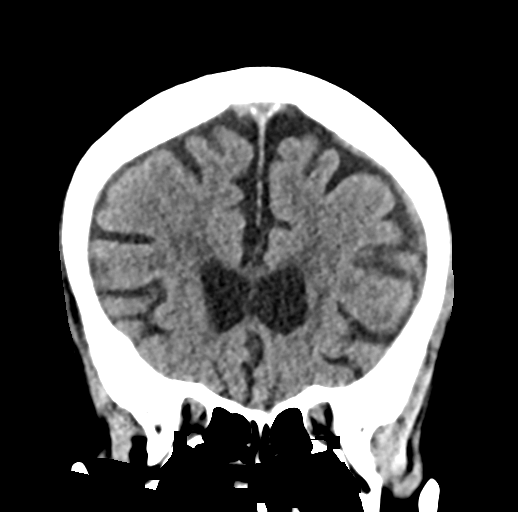
[im 30/67  brain]
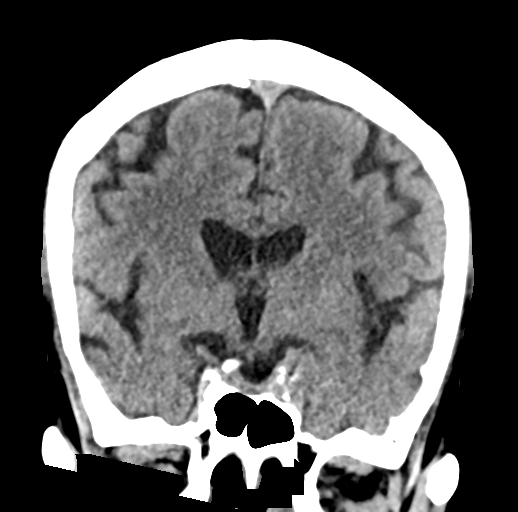
[im 37/67  brain]
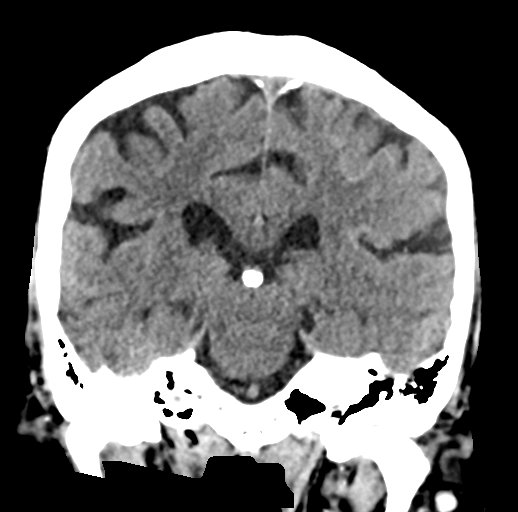

[Series 6: sag soft · sagittal · 0.32mm/px · 3 of 53 slices shown]
[im 23/53  brain]
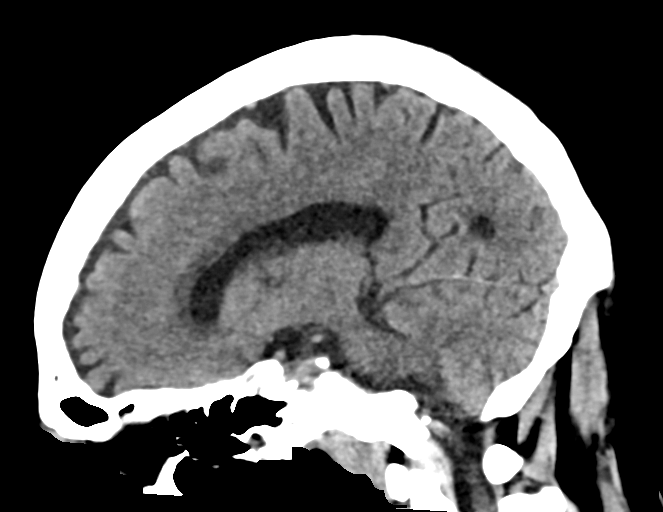
[im 27/53  brain]
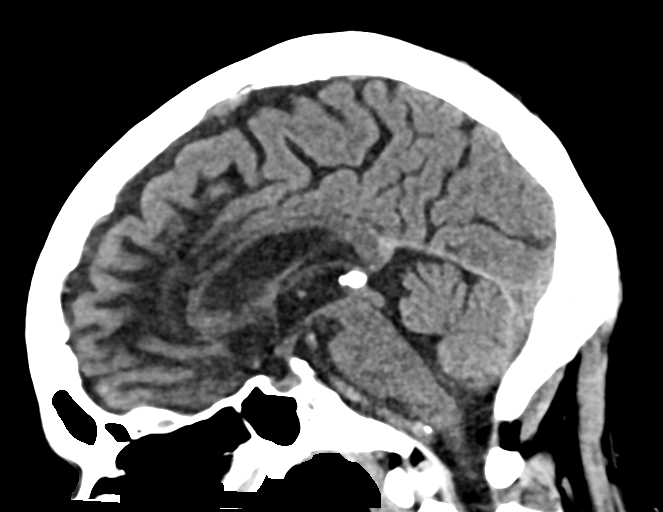
[im 31/53  brain]
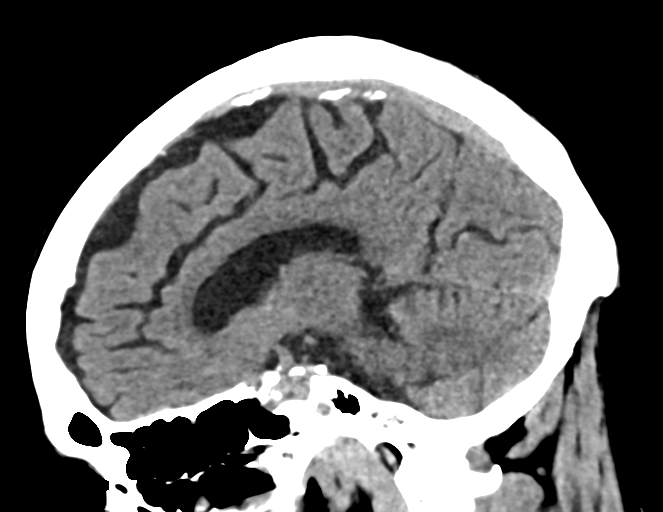

[16 of 47 positions shown; findings below may reference images not displayed]

FINDINGS: Brain: There has been continued normal interval evolution of
previously identified posterior left MCA territory infarct.
Previously seen associated hypodensity is less apparent, with a few
scattered areas of laminar necrosis now seen within the area of
infarction. No evidence for hemorrhagic transformation or other
complication.

Underlying atrophy with chronic small vessel ischemic disease again
noted, stable. No new large vessel territory infarct. No acute
intracranial hemorrhage. No mass lesion, midline shift or mass
effect. No hydrocephalus. No extra-axial fluid collection.

Vascular: No hyperdense vessel. Calcified atherosclerosis at the
skull base.

Skull: Scalp soft tissues and calvarium demonstrate no acute
abnormality.

Sinuses/Orbits: Globes and orbital soft tissues within normal
limits. Paranasal sinuses are clear. No mastoid effusion.

Other: None.
IMPRESSION: 1. Continued normal expected interval evolution of left posterior
MCA territory infarct with interval development of small volume
laminar necrosis. No evidence for hemorrhagic transformation or
other complication.
2. No other new acute intracranial abnormality.
3. Stable background cerebral atrophy with chronic small vessel
ischemic changes.

## 2018-08-12 IMAGING — DX DG CHEST 1V PORT
1 series · 1 of 1 positions shown · non-contrast
Comparison: Radiograph July 06, 2017.

CLINICAL DATA: Fatigue.

EXAM:
PORTABLE CHEST 1 VIEW

[chest ap]
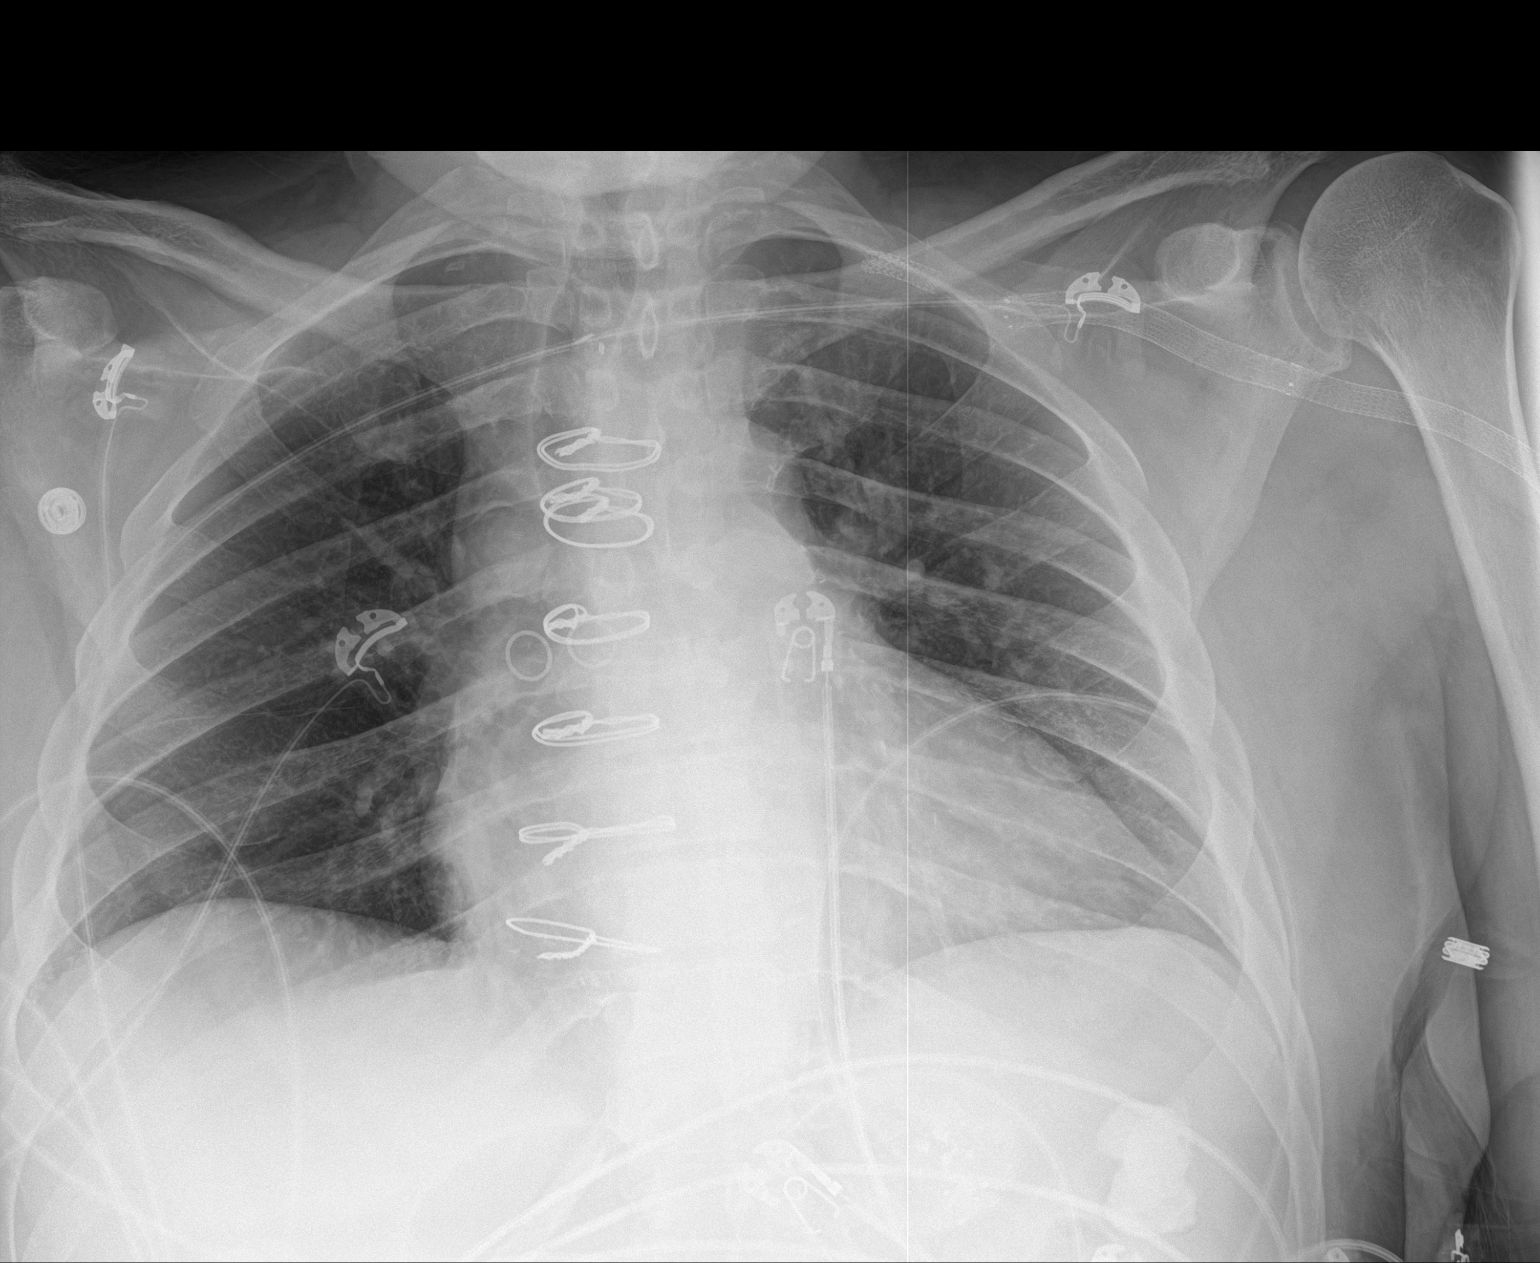

[1 of 1 positions shown; findings below may reference images not displayed]

FINDINGS: Stable cardiomediastinal silhouette. Status post coronary artery
bypass graft. No pneumothorax or pleural effusion is noted. Both
lungs are clear. The visualized skeletal structures are
unremarkable.
IMPRESSION: No acute cardiopulmonary abnormality seen.
